# Patient Record
Sex: Male | Born: 1940 | Race: Black or African American | Hispanic: No | Marital: Married | Smoking: Current some day smoker
Health system: Southern US, Community
[De-identification: ages and names within clinical notes are randomized; demographics above are authoritative.]

## PROBLEM LIST (undated history)

## (undated) DIAGNOSIS — I639 Cerebral infarction, unspecified: Secondary | ICD-10-CM

## (undated) DIAGNOSIS — M25471 Effusion, right ankle: Secondary | ICD-10-CM

## (undated) DIAGNOSIS — Z95810 Presence of automatic (implantable) cardiac defibrillator: Secondary | ICD-10-CM

## (undated) DIAGNOSIS — R7983 Abnormal findings of blood amino-acid level: Secondary | ICD-10-CM

## (undated) DIAGNOSIS — J189 Pneumonia, unspecified organism: Secondary | ICD-10-CM

## (undated) DIAGNOSIS — N200 Calculus of kidney: Secondary | ICD-10-CM

## (undated) DIAGNOSIS — I509 Heart failure, unspecified: Secondary | ICD-10-CM

## (undated) DIAGNOSIS — I82409 Acute embolism and thrombosis of unspecified deep veins of unspecified lower extremity: Secondary | ICD-10-CM

## (undated) DIAGNOSIS — E7211 Homocystinuria: Secondary | ICD-10-CM

## (undated) DIAGNOSIS — I2510 Atherosclerotic heart disease of native coronary artery without angina pectoris: Secondary | ICD-10-CM

## (undated) DIAGNOSIS — I10 Essential (primary) hypertension: Secondary | ICD-10-CM

## (undated) DIAGNOSIS — F0390 Unspecified dementia without behavioral disturbance: Secondary | ICD-10-CM

## (undated) DIAGNOSIS — M1990 Unspecified osteoarthritis, unspecified site: Secondary | ICD-10-CM

## (undated) DIAGNOSIS — Q211 Atrial septal defect: Secondary | ICD-10-CM

## (undated) DIAGNOSIS — E785 Hyperlipidemia, unspecified: Secondary | ICD-10-CM

## (undated) DIAGNOSIS — R413 Other amnesia: Secondary | ICD-10-CM

## (undated) DIAGNOSIS — Q2112 Patent foramen ovale: Secondary | ICD-10-CM

## (undated) DIAGNOSIS — I2699 Other pulmonary embolism without acute cor pulmonale: Secondary | ICD-10-CM

## (undated) DIAGNOSIS — M25472 Effusion, left ankle: Secondary | ICD-10-CM

## (undated) DIAGNOSIS — I420 Dilated cardiomyopathy: Secondary | ICD-10-CM

## (undated) DIAGNOSIS — R001 Bradycardia, unspecified: Secondary | ICD-10-CM

## (undated) DIAGNOSIS — K5090 Crohn's disease, unspecified, without complications: Secondary | ICD-10-CM

## (undated) HISTORY — DX: Atrial septal defect: Q21.1

## (undated) HISTORY — DX: Acute embolism and thrombosis of unspecified deep veins of unspecified lower extremity: I82.409

## (undated) HISTORY — DX: Hyperlipidemia, unspecified: E78.5

## (undated) HISTORY — DX: Homocystinuria: E72.11

## (undated) HISTORY — DX: Calculus of kidney: N20.0

## (undated) HISTORY — DX: Atherosclerotic heart disease of native coronary artery without angina pectoris: I25.10

## (undated) HISTORY — PX: COLON SURGERY: SHX602

## (undated) HISTORY — DX: Other pulmonary embolism without acute cor pulmonale: I26.99

## (undated) HISTORY — DX: Abnormal findings of blood amino-acid level: R79.83

## (undated) HISTORY — DX: Crohn's disease, unspecified, without complications: K50.90

## (undated) HISTORY — DX: Patent foramen ovale: Q21.12

## (undated) HISTORY — DX: Dilated cardiomyopathy: I42.0

## (undated) HISTORY — PX: APPENDECTOMY: SHX54

---

## 1991-04-16 DIAGNOSIS — I639 Cerebral infarction, unspecified: Secondary | ICD-10-CM

## 1991-04-16 HISTORY — DX: Cerebral infarction, unspecified: I63.9

## 1997-08-20 ENCOUNTER — Ambulatory Visit (HOSPITAL_COMMUNITY): Admission: RE | Admit: 1997-08-20 | Discharge: 1997-08-20 | Payer: Self-pay | Admitting: Pediatrics

## 1997-08-30 ENCOUNTER — Encounter: Admission: RE | Admit: 1997-08-30 | Discharge: 1997-11-28 | Payer: Self-pay | Admitting: Pediatrics

## 1998-05-23 ENCOUNTER — Ambulatory Visit (HOSPITAL_COMMUNITY): Admission: RE | Admit: 1998-05-23 | Discharge: 1998-05-23 | Payer: Self-pay | Admitting: Family Medicine

## 1999-01-22 ENCOUNTER — Ambulatory Visit (HOSPITAL_BASED_OUTPATIENT_CLINIC_OR_DEPARTMENT_OTHER): Admission: RE | Admit: 1999-01-22 | Discharge: 1999-01-22 | Payer: Self-pay | Admitting: Orthopedic Surgery

## 1999-04-24 ENCOUNTER — Encounter: Admission: RE | Admit: 1999-04-24 | Discharge: 1999-04-24 | Payer: Self-pay | Admitting: Family Medicine

## 1999-04-24 ENCOUNTER — Encounter: Payer: Self-pay | Admitting: Family Medicine

## 1999-06-11 ENCOUNTER — Emergency Department (HOSPITAL_COMMUNITY): Admission: EM | Admit: 1999-06-11 | Discharge: 1999-06-11 | Payer: Self-pay | Admitting: Emergency Medicine

## 1999-06-22 ENCOUNTER — Encounter: Payer: Self-pay | Admitting: Urology

## 1999-06-26 ENCOUNTER — Encounter: Payer: Self-pay | Admitting: Urology

## 1999-06-26 ENCOUNTER — Ambulatory Visit (HOSPITAL_COMMUNITY): Admission: RE | Admit: 1999-06-26 | Discharge: 1999-06-26 | Payer: Self-pay | Admitting: Urology

## 1999-06-26 ENCOUNTER — Encounter (INDEPENDENT_AMBULATORY_CARE_PROVIDER_SITE_OTHER): Payer: Self-pay | Admitting: Specialist

## 1999-11-07 ENCOUNTER — Encounter: Admission: RE | Admit: 1999-11-07 | Discharge: 1999-11-07 | Payer: Self-pay | Admitting: Family Medicine

## 1999-11-07 ENCOUNTER — Encounter: Payer: Self-pay | Admitting: Family Medicine

## 2000-09-06 ENCOUNTER — Emergency Department (HOSPITAL_COMMUNITY): Admission: EM | Admit: 2000-09-06 | Discharge: 2000-09-07 | Payer: Self-pay | Admitting: Emergency Medicine

## 2000-12-25 ENCOUNTER — Encounter: Payer: Self-pay | Admitting: Emergency Medicine

## 2000-12-25 ENCOUNTER — Emergency Department (HOSPITAL_COMMUNITY): Admission: EM | Admit: 2000-12-25 | Discharge: 2000-12-25 | Payer: Self-pay | Admitting: Emergency Medicine

## 2001-01-24 ENCOUNTER — Emergency Department (HOSPITAL_COMMUNITY): Admission: EM | Admit: 2001-01-24 | Discharge: 2001-01-24 | Payer: Self-pay | Admitting: *Deleted

## 2001-09-20 ENCOUNTER — Emergency Department (HOSPITAL_COMMUNITY): Admission: EM | Admit: 2001-09-20 | Discharge: 2001-09-20 | Payer: Self-pay | Admitting: Emergency Medicine

## 2002-04-15 ENCOUNTER — Encounter: Payer: Self-pay | Admitting: Emergency Medicine

## 2002-04-15 ENCOUNTER — Emergency Department (HOSPITAL_COMMUNITY): Admission: EM | Admit: 2002-04-15 | Discharge: 2002-04-15 | Payer: Self-pay | Admitting: Emergency Medicine

## 2002-09-11 ENCOUNTER — Emergency Department (HOSPITAL_COMMUNITY): Admission: EM | Admit: 2002-09-11 | Discharge: 2002-09-11 | Payer: Self-pay | Admitting: Emergency Medicine

## 2002-09-11 ENCOUNTER — Encounter: Payer: Self-pay | Admitting: Emergency Medicine

## 2003-09-20 ENCOUNTER — Encounter: Admission: RE | Admit: 2003-09-20 | Discharge: 2003-09-20 | Payer: Self-pay | Admitting: Family Medicine

## 2003-10-11 ENCOUNTER — Encounter: Payer: Self-pay | Admitting: Emergency Medicine

## 2003-10-12 ENCOUNTER — Inpatient Hospital Stay (HOSPITAL_COMMUNITY): Admission: EM | Admit: 2003-10-12 | Discharge: 2003-10-18 | Payer: Self-pay | Admitting: Neurology

## 2003-10-12 ENCOUNTER — Encounter: Payer: Self-pay | Admitting: Cardiology

## 2003-10-27 ENCOUNTER — Encounter: Admission: RE | Admit: 2003-10-27 | Discharge: 2004-01-25 | Payer: Self-pay | Admitting: Neurology

## 2004-01-12 ENCOUNTER — Ambulatory Visit (HOSPITAL_COMMUNITY): Admission: RE | Admit: 2004-01-12 | Discharge: 2004-01-13 | Payer: Self-pay | Admitting: Cardiology

## 2004-01-18 ENCOUNTER — Ambulatory Visit (HOSPITAL_COMMUNITY): Admission: RE | Admit: 2004-01-18 | Discharge: 2004-01-18 | Payer: Self-pay | Admitting: Cardiology

## 2004-03-01 ENCOUNTER — Encounter: Admission: RE | Admit: 2004-03-01 | Discharge: 2004-03-01 | Payer: Self-pay | Admitting: Family Medicine

## 2004-03-05 ENCOUNTER — Encounter: Admission: RE | Admit: 2004-03-05 | Discharge: 2004-03-05 | Payer: Self-pay | Admitting: Family Medicine

## 2004-08-16 ENCOUNTER — Encounter: Admission: RE | Admit: 2004-08-16 | Discharge: 2004-08-16 | Payer: Self-pay | Admitting: Occupational Medicine

## 2004-09-06 ENCOUNTER — Emergency Department (HOSPITAL_COMMUNITY): Admission: EM | Admit: 2004-09-06 | Discharge: 2004-09-06 | Payer: Self-pay | Admitting: Family Medicine

## 2005-07-25 ENCOUNTER — Inpatient Hospital Stay (HOSPITAL_COMMUNITY): Admission: EM | Admit: 2005-07-25 | Discharge: 2005-08-01 | Payer: Self-pay | Admitting: Emergency Medicine

## 2005-07-25 ENCOUNTER — Encounter: Payer: Self-pay | Admitting: Vascular Surgery

## 2005-07-26 ENCOUNTER — Ambulatory Visit: Payer: Self-pay | Admitting: Internal Medicine

## 2005-07-26 ENCOUNTER — Encounter: Payer: Self-pay | Admitting: Internal Medicine

## 2005-08-05 ENCOUNTER — Ambulatory Visit (HOSPITAL_COMMUNITY): Admission: RE | Admit: 2005-08-05 | Discharge: 2005-08-05 | Payer: Self-pay | Admitting: Family Medicine

## 2006-02-26 ENCOUNTER — Encounter: Admission: RE | Admit: 2006-02-26 | Discharge: 2006-02-26 | Payer: Self-pay | Admitting: Family Medicine

## 2007-07-08 ENCOUNTER — Encounter: Admission: RE | Admit: 2007-07-08 | Discharge: 2007-07-08 | Payer: Self-pay | Admitting: Podiatry

## 2008-01-04 ENCOUNTER — Encounter: Admission: RE | Admit: 2008-01-04 | Discharge: 2008-01-04 | Payer: Self-pay | Admitting: Family Medicine

## 2008-03-21 ENCOUNTER — Encounter: Admission: RE | Admit: 2008-03-21 | Discharge: 2008-03-21 | Payer: Self-pay | Admitting: Internal Medicine

## 2008-08-04 ENCOUNTER — Emergency Department (HOSPITAL_COMMUNITY): Admission: EM | Admit: 2008-08-04 | Discharge: 2008-08-04 | Payer: Self-pay | Admitting: Emergency Medicine

## 2008-10-31 ENCOUNTER — Encounter: Payer: Self-pay | Admitting: Cardiology

## 2008-11-16 ENCOUNTER — Encounter: Payer: Self-pay | Admitting: Cardiology

## 2008-11-24 ENCOUNTER — Encounter: Admission: RE | Admit: 2008-11-24 | Discharge: 2008-11-24 | Payer: Self-pay | Admitting: Gastroenterology

## 2008-12-12 ENCOUNTER — Ambulatory Visit: Payer: Self-pay | Admitting: Cardiology

## 2008-12-12 DIAGNOSIS — I2510 Atherosclerotic heart disease of native coronary artery without angina pectoris: Secondary | ICD-10-CM | POA: Insufficient documentation

## 2008-12-12 DIAGNOSIS — I420 Dilated cardiomyopathy: Secondary | ICD-10-CM

## 2008-12-12 DIAGNOSIS — F17200 Nicotine dependence, unspecified, uncomplicated: Secondary | ICD-10-CM

## 2008-12-12 DIAGNOSIS — R9431 Abnormal electrocardiogram [ECG] [EKG]: Secondary | ICD-10-CM

## 2009-02-10 ENCOUNTER — Ambulatory Visit: Payer: Self-pay | Admitting: Cardiovascular Disease

## 2009-02-10 ENCOUNTER — Ambulatory Visit: Payer: Self-pay | Admitting: Cardiology

## 2009-02-10 ENCOUNTER — Ambulatory Visit: Payer: Self-pay

## 2009-02-10 ENCOUNTER — Encounter: Payer: Self-pay | Admitting: Cardiology

## 2009-02-10 ENCOUNTER — Ambulatory Visit (HOSPITAL_COMMUNITY): Admission: RE | Admit: 2009-02-10 | Discharge: 2009-02-10 | Payer: Self-pay | Admitting: Cardiology

## 2009-02-10 DIAGNOSIS — I110 Hypertensive heart disease with heart failure: Secondary | ICD-10-CM | POA: Insufficient documentation

## 2009-02-22 ENCOUNTER — Encounter: Payer: Self-pay | Admitting: Cardiology

## 2009-02-24 ENCOUNTER — Ambulatory Visit: Payer: Self-pay | Admitting: Cardiology

## 2009-02-24 LAB — CONVERTED CEMR LAB: POC INR: 1.1

## 2009-02-25 ENCOUNTER — Encounter: Admission: RE | Admit: 2009-02-25 | Discharge: 2009-02-25 | Payer: Self-pay | Admitting: Neurology

## 2009-03-02 ENCOUNTER — Ambulatory Visit: Payer: Self-pay | Admitting: Internal Medicine

## 2009-03-08 ENCOUNTER — Telehealth: Payer: Self-pay | Admitting: Internal Medicine

## 2009-03-08 ENCOUNTER — Encounter: Payer: Self-pay | Admitting: Cardiology

## 2009-03-08 ENCOUNTER — Ambulatory Visit: Payer: Self-pay | Admitting: Internal Medicine

## 2009-03-08 DIAGNOSIS — R079 Chest pain, unspecified: Secondary | ICD-10-CM

## 2009-03-08 LAB — CONVERTED CEMR LAB: POC INR: 2.9

## 2009-03-20 ENCOUNTER — Telehealth (INDEPENDENT_AMBULATORY_CARE_PROVIDER_SITE_OTHER): Payer: Self-pay

## 2009-03-21 ENCOUNTER — Ambulatory Visit: Payer: Self-pay

## 2009-03-21 ENCOUNTER — Ambulatory Visit: Payer: Self-pay | Admitting: Internal Medicine

## 2009-03-21 ENCOUNTER — Encounter (HOSPITAL_COMMUNITY): Admission: RE | Admit: 2009-03-21 | Discharge: 2009-04-13 | Payer: Self-pay | Admitting: Internal Medicine

## 2009-03-21 ENCOUNTER — Encounter (INDEPENDENT_AMBULATORY_CARE_PROVIDER_SITE_OTHER): Payer: Self-pay

## 2009-03-21 ENCOUNTER — Encounter (INDEPENDENT_AMBULATORY_CARE_PROVIDER_SITE_OTHER): Payer: Self-pay | Admitting: Cardiology

## 2009-03-21 LAB — CONVERTED CEMR LAB: POC INR: 2.7

## 2009-03-27 ENCOUNTER — Telehealth: Payer: Self-pay | Admitting: Cardiology

## 2009-03-28 ENCOUNTER — Ambulatory Visit: Payer: Self-pay | Admitting: Cardiology

## 2009-03-28 LAB — CONVERTED CEMR LAB: POC INR: 2.4

## 2009-04-03 ENCOUNTER — Ambulatory Visit: Payer: Self-pay | Admitting: Cardiology

## 2009-04-03 DIAGNOSIS — R413 Other amnesia: Secondary | ICD-10-CM

## 2009-04-19 ENCOUNTER — Ambulatory Visit: Payer: Self-pay | Admitting: Cardiovascular Disease

## 2009-04-19 LAB — CONVERTED CEMR LAB: POC INR: 2.5

## 2009-05-17 ENCOUNTER — Ambulatory Visit: Payer: Self-pay | Admitting: Cardiovascular Disease

## 2009-05-17 LAB — CONVERTED CEMR LAB: POC INR: 2.4

## 2009-06-14 ENCOUNTER — Ambulatory Visit: Payer: Self-pay | Admitting: Cardiovascular Disease

## 2009-07-12 ENCOUNTER — Ambulatory Visit: Payer: Self-pay | Admitting: Internal Medicine

## 2009-07-12 ENCOUNTER — Ambulatory Visit: Payer: Self-pay | Admitting: Cardiology

## 2009-07-12 DIAGNOSIS — E119 Type 2 diabetes mellitus without complications: Secondary | ICD-10-CM | POA: Insufficient documentation

## 2009-09-04 ENCOUNTER — Encounter (INDEPENDENT_AMBULATORY_CARE_PROVIDER_SITE_OTHER): Payer: Self-pay | Admitting: Pharmacist

## 2009-11-01 ENCOUNTER — Ambulatory Visit: Payer: Self-pay | Admitting: Cardiology

## 2009-11-08 ENCOUNTER — Ambulatory Visit: Payer: Self-pay | Admitting: Cardiology

## 2009-11-08 LAB — CONVERTED CEMR LAB: POC INR: 1.9

## 2009-11-22 ENCOUNTER — Ambulatory Visit: Payer: Self-pay | Admitting: Cardiology

## 2009-12-07 ENCOUNTER — Ambulatory Visit: Payer: Self-pay | Admitting: Internal Medicine

## 2009-12-07 LAB — CONVERTED CEMR LAB: POC INR: 1.7

## 2009-12-26 ENCOUNTER — Ambulatory Visit: Payer: Self-pay | Admitting: Cardiovascular Disease

## 2009-12-26 ENCOUNTER — Ambulatory Visit: Payer: Self-pay | Admitting: Cardiology

## 2009-12-26 LAB — CONVERTED CEMR LAB: POC INR: 3.5

## 2010-01-10 ENCOUNTER — Ambulatory Visit: Payer: Self-pay | Admitting: Internal Medicine

## 2010-01-10 ENCOUNTER — Ambulatory Visit: Payer: Self-pay | Admitting: Cardiology

## 2010-01-10 DIAGNOSIS — I5022 Chronic systolic (congestive) heart failure: Secondary | ICD-10-CM

## 2010-01-10 LAB — CONVERTED CEMR LAB: POC INR: 3.4

## 2010-01-11 ENCOUNTER — Emergency Department (HOSPITAL_COMMUNITY): Admission: EM | Admit: 2010-01-11 | Discharge: 2010-01-12 | Payer: Self-pay | Admitting: Emergency Medicine

## 2010-01-19 LAB — CONVERTED CEMR LAB
BUN: 14 mg/dL (ref 6–23)
CO2: 29 meq/L (ref 19–32)
Calcium: 8.9 mg/dL (ref 8.4–10.5)
Chloride: 106 meq/L (ref 96–112)
Creatinine, Ser: 1.4 mg/dL (ref 0.4–1.5)
GFR calc non Af Amer: 65.28 mL/min (ref >60)
Glucose, Bld: 131 mg/dL — ABNORMAL HIGH (ref 70–99)
Potassium: 4.3 meq/L (ref 3.5–5.1)
Sodium: 141 meq/L (ref 135–145)

## 2010-01-31 ENCOUNTER — Ambulatory Visit: Payer: Self-pay | Admitting: Cardiology

## 2010-01-31 LAB — CONVERTED CEMR LAB: POC INR: 2

## 2010-02-18 ENCOUNTER — Inpatient Hospital Stay (HOSPITAL_COMMUNITY): Admission: EM | Admit: 2010-02-18 | Discharge: 2010-03-01 | Payer: Self-pay | Admitting: Emergency Medicine

## 2010-02-20 ENCOUNTER — Encounter (INDEPENDENT_AMBULATORY_CARE_PROVIDER_SITE_OTHER): Payer: Self-pay | Admitting: Internal Medicine

## 2010-02-22 ENCOUNTER — Encounter (INDEPENDENT_AMBULATORY_CARE_PROVIDER_SITE_OTHER): Payer: Self-pay | Admitting: Internal Medicine

## 2010-02-26 DIAGNOSIS — F068 Other specified mental disorders due to known physiological condition: Secondary | ICD-10-CM

## 2010-02-27 DIAGNOSIS — R404 Transient alteration of awareness: Secondary | ICD-10-CM

## 2010-03-14 ENCOUNTER — Ambulatory Visit: Payer: Self-pay | Admitting: Internal Medicine

## 2010-03-14 LAB — CONVERTED CEMR LAB: POC INR: 2.3

## 2010-04-05 ENCOUNTER — Encounter: Payer: Self-pay | Admitting: Pulmonary Disease

## 2010-04-05 ENCOUNTER — Ambulatory Visit: Payer: Self-pay | Admitting: Pulmonary Disease

## 2010-04-05 DIAGNOSIS — J449 Chronic obstructive pulmonary disease, unspecified: Secondary | ICD-10-CM

## 2010-04-06 DIAGNOSIS — J439 Emphysema, unspecified: Secondary | ICD-10-CM | POA: Insufficient documentation

## 2010-04-11 ENCOUNTER — Ambulatory Visit: Payer: Self-pay | Admitting: Cardiology

## 2010-05-09 ENCOUNTER — Ambulatory Visit: Admission: RE | Admit: 2010-05-09 | Discharge: 2010-05-09 | Payer: Self-pay | Source: Home / Self Care

## 2010-05-17 ENCOUNTER — Inpatient Hospital Stay (HOSPITAL_COMMUNITY)
Admission: EM | Admit: 2010-05-17 | Discharge: 2010-05-23 | DRG: 065 | Disposition: A | Discharge status: Rehabilitation Facility | Payer: No Typology Code available for payment source | Attending: Internal Medicine | Admitting: Internal Medicine

## 2010-05-17 ENCOUNTER — Emergency Department (HOSPITAL_COMMUNITY): Payer: No Typology Code available for payment source

## 2010-05-17 LAB — DIFFERENTIAL
Basophils Relative: 1 % (ref 0–1)
Eosinophils Absolute: 0.1 10*3/uL (ref 0.0–0.7)
Eosinophils Relative: 2 % (ref 0–5)
Lymphs Abs: 1.5 10*3/uL (ref 0.7–4.0)
Monocytes Absolute: 0.5 10*3/uL (ref 0.1–1.0)
Monocytes Relative: 8 % (ref 3–12)
Neutrophils Relative %: 64 % (ref 43–77)

## 2010-05-17 LAB — COMPREHENSIVE METABOLIC PANEL
Albumin: 3.2 g/dL — ABNORMAL LOW (ref 3.5–5.2)
Alkaline Phosphatase: 85 U/L (ref 39–117)
BUN: 14 mg/dL (ref 6–23)
Creatinine, Ser: 1.43 mg/dL (ref 0.4–1.5)
Glucose, Bld: 187 mg/dL — ABNORMAL HIGH (ref 70–99)
Potassium: 4 mEq/L (ref 3.5–5.1)
Total Bilirubin: 0.9 mg/dL (ref 0.3–1.2)
Total Protein: 6.3 g/dL (ref 6.0–8.3)

## 2010-05-17 LAB — CBC
HCT: 41.6 % (ref 39.0–52.0)
Hemoglobin: 13.8 g/dL (ref 13.0–17.0)
MCHC: 33.2 g/dL (ref 30.0–36.0)
RBC: 4.51 MIL/uL (ref 4.22–5.81)
WBC: 5.9 10*3/uL (ref 4.0–10.5)

## 2010-05-17 LAB — PROTIME-INR
INR: 1.05 (ref 0.00–1.49)
Prothrombin Time: 13.9 seconds (ref 11.6–15.2)

## 2010-05-17 LAB — TROPONIN I: Troponin I: 0.01 ng/mL (ref 0.00–0.06)

## 2010-05-17 LAB — CK TOTAL AND CKMB (NOT AT ARMC)
CK, MB: 1.3 ng/mL (ref 0.3–4.0)
Total CK: 67 U/L (ref 7–232)

## 2010-05-17 LAB — APTT: aPTT: 27 seconds (ref 24–37)

## 2010-05-17 LAB — POCT CARDIAC MARKERS
Myoglobin, poc: 75.2 ng/mL (ref 12–200)
Troponin i, poc: <0.05 ng/mL (ref 0.00–0.09)
Troponin i, poc: <0.05 ng/mL (ref 0.00–0.09)

## 2010-05-17 NOTE — Assessment & Plan Note (Signed)
Summary: per walk ins/af   Visit Type:  Follow-up Referring Provider:  Dr. Burney Gauze Primary Provider:  Dr. Criss Rosales  CC:  Cardiomyopathy.  History of Present Illness: The patient presents for evaluation of his known cardiomyopathy. Since I last saw him he has had no new cardiovascular complaints. He has recently been started on Januvia for his diabetes. However, he ran out of the samples. He has been taking the cardiac meds as listed. At the last visit I added carvedilol 3.125 mg b.i.d. With this he denies any presyncope or syncope. He's not having any shortness of breath, PND or orthopnea. He's not having any chest pressure, neck or arm discomfort. He said no weight gain or swelling. He denies any leg pain or claudication. He does not exercise routinely unfortunately.   Current Medications (verified): 1)  Lisinopril 5 Mg Tabs (Lisinopril) .[name].. 1 By Mouth Daily 2)  Warfarin Sodium 5 Mg Tabs (Warfarin Sodium) .... Use As Directed By Anticoagulation Clinic 3)  Carvedilol 3.125 Mg Tabs (Carvedilol) .... One Twice A Day  Allergies (verified): No Known Drug Allergies  Past History:  Past Medical History: Reviewed history from 04/03/2009 and no changes required. Pulmonary emboli 2007 Coronary artery disease with prior LAD stenting-[place] 2005 Left lower extremity DVT Hyperhomocystinemia Diabetes mellitus Dyslipidemia Intra-atrial septal aneurysm Patent foramen ovale Hypertension Heart failure or( EF 25%) Crohn's disease Nephrolithiasis CVA x 2  Past Surgical History: Reviewed history from 12/12/2008 and no changes required. Resection of terminal ileum Appendectomy  Review of Systems       As stated in the HPI and negative for all other systems.   Vital Signs:  Patient profile:   70 year old male Height:      69 inches Weight:      153 pounds BMI:     22.68 Pulse rate:   58 / minute Resp:     16 per minute BP sitting:   138 / 72  (right arm)  Vitals Entered By: Levora Angel, CNA (July 12, 2009 9:43 AM)  Physical Exam  General:  Well developed, well nourished, in no acute distress. Head:  normocephalic and atraumatic Eyes:  PERRLA/EOM intact; conjunctiva and lids normal. Mouth:  Upper dentures, lower partial. Oral mucosa normal. Neck:  Neck supple, no JVD. No masses, thyromegaly or abnormal cervical nodes. Chest Wall:  no deformities or breast masses noted Lungs:  Clear bilaterally to auscultation and percussion. Abdomen:  Bowel sounds positive; abdomen soft and non-tender without masses, organomegaly, or hernias noted. No hepatosplenomegaly. Msk:  Back normal, normal gait. Muscle strength and tone normal. Extremities:  No clubbing or cyanosis. Neurologic:  Alert and oriented x 3. Skin:  Intact without lesions or rashes. Cervical Nodes:  no significant adenopathy Axillary Nodes:  no significant adenopathy Inguinal Nodes:  no significant adenopathy Psych:  Normal affect.   Detailed Cardiovascular Exam  Neck    Carotids: Carotids full and equal bilaterally without bruits.      Neck Veins: Normal, no JVD.    Heart    Inspection: no deformities or lifts noted.      Palpation: normal PMI with no thrills palpable.      Auscultation: regular rate and rhythm, S1, S2 without murmurs, rubs, gallops, or clicks.    Vascular    Abdominal Aorta: no palpable masses, pulsations, or audible bruits.      Femoral Pulses: normal femoral pulses bilaterally.      Pedal Pulses: diminished right dorsalis pedis pulse, diminished right posterior tibial pulse,  diminished left dorsalis pedis pulse, and diminished left posterior tibial pulse.      Radial Pulses: normal radial pulses bilaterally.      Peripheral Circulation: no clubbing, cyanosis, or edema noted with normal capillary refill.     EKG  Procedure date:  07/12/2009  Findings:      sinus rhythm with premature atrial contractions, early repolarization pattern unchanged from previous EKGs  Impression &  Recommendations:  Problem # 1:  CARDIOMYOPATHY (ICD-425.4) He tolerated the addition of carvedilol. I will titrate this to 6.25 mg b.i.d. He will continue the other medicines as listed. Orders: EKG w/ Interpretation (93000)  Problem # 2:  ESSENTIAL HYPERTENSION, BENIGN (ICD-401.1) We will continue to manage his blood pressure in the context of titrating his meds for cardiomyopathy.  Problem # 3:  DM (ICD-250.00) I spoke with his primary care office today to let him know the patient had no further samples of Januvia. They requested that he call for an appointment to readdress his diabetes control and the patient will do this.  Problem # 4:  CORONARY ARTERY DISEASE S/P LAD DES/PTCA 2005 (ICD-414.00) He is having no ongoing symptoms. He will continue with risk reduction.  Patient Instructions: 1)  Your physician recommends that you schedule a follow-up appointment in: 1 month with Dr Percival Spanish 2)  Your physician has recommended you make the following change in your medication: Increase Carvedilol to 6.25 mg twice a day Prescriptions: CARVEDILOL 6.25 MG TABS (CARVEDILOL) one twice a day  #60 x 11   Entered by:   Sim Boast, RN   Authorized by:   Minus Breeding, MD, [facility]   Signed by:   Sim Boast, RN on 07/12/2009   Method used:   Electronically to        [facility]. [phone]* (retail)       [address]       [place], [place]  [address]       Ph: [phone] or [phone]       Fax: [phone]   RxID:   [id number]

## 2010-05-17 NOTE — Medication Information (Signed)
Summary: rov/jm  Anticoagulant Therapy  Managed by: Freddrick March, RN, BSN Referring MD: Olin Pia PCP: Dr. Criss Rosales Supervising MD: Angelena Form MD, Harrell Gave Indication 1: CVA (436.0) Indication 2: CHF Lab Used: [facility] Site: [facility] INR POC 2.5 INR RANGE 2.0-3.0  Dietary changes: no    Health status changes: no    Bleeding/hemorrhagic complications: no    Recent/future hospitalizations: no    Any changes in medication regimen? no    Recent/future dental: no  Any missed doses?: no       Is patient compliant with meds? yes       Allergies (verified): No Known Drug Allergies  Anticoagulation Management History:      The patient is taking warfarin and comes in today for a routine follow up visit.  Positive risk factors for bleeding include an age of 73 years or older.  The bleeding index is 'intermediate risk'.  Positive CHADS2 values include History of HTN.  Negative CHADS2 values include Age > 3 years old.  His last INR was 2.4.  Anticoagulation responsible provider: Angelena Form MD, Harrell Gave.  INR POC: 2.5.  Cuvette Lot#: [id number].  Exp: 05/2010.    Anticoagulation Management Assessment/Plan:      The patient's current anticoagulation dose is Warfarin sodium 5 mg tabs: Use as directed by Anticoagulation Clinic.  The target INR is 2.0-3.0.  The next INR is due 05/17/2009.  Anticoagulation instructions were given to patient.  Results were reviewed/authorized by Freddrick March, RN, BSN.  He was notified by Freddrick March RN.         Prior Anticoagulation Instructions: INR 2.4  CONTINUE TO TAKE 1 TABLET EVERY DAY.  RECHECK IN 4 WEEKS.  Current Anticoagulation Instructions: INR 2.5  Continue on same dosage 1 tablet daily.   Recheck in 4 weeks.

## 2010-05-17 NOTE — Medication Information (Signed)
Summary: rov/ewj  Anticoagulant Therapy  Managed by: Freddrick March, RN, BSN Referring MD: Olin Pia PCP: Dr. Criss Rosales Supervising MD: Johnsie Cancel MD, Collier Salina Indication 1: CVA (436.0) Indication 2: CHF Lab Used: [facility] Site: [place] INR POC 2.4 INR RANGE 2.0-3.0  Dietary changes: no    Health status changes: no    Bleeding/hemorrhagic complications: no    Recent/future hospitalizations: no    Any changes in medication regimen? no    Recent/future dental: no  Any missed doses?: no       Is patient compliant with meds? yes       Allergies (verified): No Known Drug Allergies  Anticoagulation Management History:      The patient is taking warfarin and comes in today for a routine follow up visit.  Positive risk factors for bleeding include an age of 20 years or older.  The bleeding index is 'intermediate risk'.  Positive CHADS2 values include History of HTN.  Negative CHADS2 values include Age > 68 years old.  His last INR was 2.4.  Anticoagulation responsible provider: Johnsie Cancel MD, Collier Salina.  INR POC: 2.4.  Cuvette Lot#: [id number].  Exp: 07/2010.    Anticoagulation Management Assessment/Plan:      The patient's current anticoagulation dose is Warfarin sodium 5 mg tabs: Use as directed by Anticoagulation Clinic.  The target INR is 2.0-3.0.  The next INR is due 06/14/2009.  Anticoagulation instructions were given to patient.  Results were reviewed/authorized by Freddrick March, RN, BSN.  He was notified by Freddrick March RN.         Prior Anticoagulation Instructions: INR 2.5  Continue on same dosage 1 tablet daily.   Recheck in 4 weeks.    Current Anticoagulation Instructions: INR 2.4  Continue on same dosage 1 tablet daily.  Recheck in 4 weeks.

## 2010-05-17 NOTE — Letter (Signed)
Summary: [facility]  Coumadin  [address] [place]   [place], [place] [address]   Phone: [phone]  Fax: [phone]     Sep 04, 2009 MRN: [id number]   [place], [place]  [address]   Dear Mr. ROTHLISBERGER,  This letter is being sent to you as a reminder that it is necessary for you to get your INR/PT checked regularly so that we can optimize your care.  Our records indicate that you were scheduled to have a test done recently.  As of today, we have not received the results of this test.  It is very important that you have your INR checked.  Please call our office at the number listed above to schedule an appointment at your earliest convenience.    If you have recently had your protime checked or have discontinued this medication, please contact our office at the above phone number to clarify this issue.  Thank you for this prompt attention to this important health care matter.  Sincerely,   [facility]

## 2010-05-17 NOTE — Medication Information (Signed)
Summary: rov/ewj  Anticoagulant Therapy  Managed by: Margaretha Sheffield, PharmD Referring MD: Olin Pia PCP: Dr. Criss Rosales Supervising MD: Johnsie Cancel MD, Collier Salina Indication 1: CVA (436.0) Indication 2: CHF Lab Used: [facility] Site: [facility] INR POC 2.6 INR RANGE 2.0-3.0  Dietary changes: no    Health status changes: no    Bleeding/hemorrhagic complications: no    Recent/future hospitalizations: no    Any changes in medication regimen? no    Recent/future dental: no  Any missed doses?: no       Is patient compliant with meds? yes       Allergies: No Known Drug Allergies  Anticoagulation Management History:      The patient is taking warfarin and comes in today for a routine follow up visit.  Positive risk factors for bleeding include an age of 26 years or older.  The bleeding index is 'intermediate risk'.  Positive CHADS2 values include History of HTN.  Negative CHADS2 values include Age > 85 years old.  His last INR was 2.4.  Anticoagulation responsible provider: Johnsie Cancel MD, Collier Salina.  INR POC: 2.6.  Cuvette Lot#: [id number].  Exp: 08/2010.    Anticoagulation Management Assessment/Plan:      The patient's current anticoagulation dose is Warfarin sodium 5 mg tabs: Use as directed by Anticoagulation Clinic.  The target INR is 2.0-3.0.  The next INR is due 07/12/2009.  Anticoagulation instructions were given to patient.  Results were reviewed/authorized by Margaretha Sheffield, PharmD.  He was notified by Margaretha Sheffield.         Prior Anticoagulation Instructions: INR 2.4  Continue on same dosage 1 tablet daily.  Recheck in 4 weeks.    Current Anticoagulation Instructions: INR 2.6  Continue taking 1 tablet (5 mg) daily.  Return to clinic in 4 weeks.

## 2010-05-17 NOTE — Medication Information (Signed)
Summary: rov/sp  Anticoagulant Therapy  Managed by: Tula Nakayama, RN, BSN Referring MD: Olin Pia PCP: Dr. Criss Rosales Supervising MD: Ron Parker MD, Dellis Filbert Indication 1: CVA (436.0) Indication 2: CHF Lab Used: [facility] Site: [facility] INR POC 1.1 INR RANGE 2.0-3.0  Dietary changes: no    Health status changes: no    Bleeding/hemorrhagic complications: no    Recent/future hospitalizations: no    Any changes in medication regimen? no    Recent/future dental: no  Any missed doses?: no       Is patient compliant with meds? yes      Comments: pt given one pack of samples  Allergies: No Known Drug Allergies  Anticoagulation Management History:      The patient is taking warfarin and comes in today for a routine follow up visit.  Positive risk factors for bleeding include an age of 56 years or older and presence of serious comorbidities.  The bleeding index is 'intermediate risk'.  Positive CHADS2 values include History of HTN and History of Diabetes.  Negative CHADS2 values include Age > 20 years old.  His last INR was 2.4.  Anticoagulation responsible provider: Ron Parker MD, Dellis Filbert.  INR POC: 1.1.  Cuvette Lot#: [id number].  Exp: 01/2011.    Anticoagulation Management Assessment/Plan:      The patient's current anticoagulation dose is Warfarin sodium 5 mg tabs: Use as directed by Anticoagulation Clinic.  The target INR is 2.0-3.0.  The next INR is due 11/08/2009.  Anticoagulation instructions were given to patient.  Results were reviewed/authorized by Tula Nakayama, RN, BSN.  He was notified by Lind Covert.         Prior Anticoagulation Instructions: INR 1.6  Take 1.5 tablets today.  Then return to regular dosing schedule of 1 tablet (5 mg) every day.  Return to clinic in 3 weeks.   Current Anticoagulation Instructions: INR 1.1  Take 1.5 tabs today and tomorrow.  Then continue same dose of 1 tab daily.  Re-check INR in 1 week.

## 2010-05-17 NOTE — Letter (Signed)
Summary: [facility] Medical Office Note  [facility] Medical Office Note   Imported By: Sallee Provencal 05/09/2009 11:06:58  _____________________________________________________________________  External Attachment:    Type:   Image     Comment:   External Document

## 2010-05-17 NOTE — Medication Information (Signed)
Summary: rov/eac  Anticoagulant Therapy  Managed by: Margaretha Sheffield, PharmD Referring MD: Olin Pia PCP: Dr. Criss Rosales Supervising MD: Haroldine Laws MD, Quillian Quince Indication 1: CVA (436.0) Indication 2: CHF Lab Used: [facility] Site: [place] INR POC 1.6 INR RANGE 2.0-3.0  Dietary changes: no    Health status changes: no    Bleeding/hemorrhagic complications: no    Recent/future hospitalizations: no    Any changes in medication regimen? no    Recent/future dental: no  Any missed doses?: no       Is patient compliant with meds? yes       Allergies: No Known Drug Allergies  Anticoagulation Management History:      The patient is taking warfarin and comes in today for a routine follow up visit.  Positive risk factors for bleeding include an age of 72 years or older.  The bleeding index is 'intermediate risk'.  Positive CHADS2 values include History of HTN.  Negative CHADS2 values include Age > 72 years old.  His last INR was 2.4.  Anticoagulation responsible provider: Bensimhon MD, Quillian Quince.  INR POC: 1.6.  Cuvette Lot#: [id number].  Exp: 08/2010.    Anticoagulation Management Assessment/Plan:      The patient's current anticoagulation dose is Warfarin sodium 5 mg tabs: Use as directed by Anticoagulation Clinic.  The target INR is 2.0-3.0.  The next INR is due 08/02/2009.  Anticoagulation instructions were given to patient.  Results were reviewed/authorized by Margaretha Sheffield, PharmD.  He was notified by Margaretha Sheffield.         Prior Anticoagulation Instructions: INR 2.6  Continue taking 1 tablet (5 mg) daily.  Return to clinic in 4 weeks.   Current Anticoagulation Instructions: INR 1.6  Take 1.5 tablets today.  Then return to regular dosing schedule of 1 tablet (5 mg) every day.  Return to clinic in 3 weeks.

## 2010-05-17 NOTE — Medication Information (Signed)
Summary: rov/sp  Anticoagulant Therapy  Managed by: Gypsy Lore, PharmD Referring MD: Olin Pia PCP: Dr. Criss Rosales Supervising MD: Haroldine Laws MD, Quillian Quince Indication 1: CVA (436.0) Indication 2: CHF Lab Used: [facility] Site: [facility] INR POC 2.3 INR RANGE 2.0-3.0  Dietary changes: no    Health status changes: no    Bleeding/hemorrhagic complications: no    Recent/future hospitalizations: yes       Details: Recent D/C from hospital on 11/10. Resumed previous Coumadin schedule.   Any changes in medication regimen? no    Recent/future dental: no  Any missed doses?: no       Is patient compliant with meds? yes       Allergies: No Known Drug Allergies  Anticoagulation Management History:      The patient is taking warfarin and comes in today for a routine follow up visit.  Positive risk factors for bleeding include an age of 72 years or older and presence of serious comorbidities.  The bleeding index is 'intermediate risk'.  Positive CHADS2 values include History of CHF, History of HTN, and History of Diabetes.  Negative CHADS2 values include Age > 70 years old.  His last INR was 2.4.  Anticoagulation responsible provider: Bensimhon MD, Quillian Quince.  INR POC: 2.3.  Cuvette Lot#: [id number].  Exp: 02/2011.    Anticoagulation Management Assessment/Plan:      The patient's current anticoagulation dose is Warfarin sodium 5 mg tabs: Use as directed by Anticoagulation Clinic.  The target INR is 2.0-3.0.  The next INR is due 04/11/2010.  Anticoagulation instructions were given to patient.  Results were reviewed/authorized by Gypsy Lore, PharmD.  He was notified by Gypsy Lore PharmD.         Prior Anticoagulation Instructions: INR 2.0  Continue taking 1 tablet everyday. Recheck in 4 weeks.   Current Anticoagulation Instructions: INR 2.3  Continue taking Coumadin 1 tab (5 mg) every day. Return to clinic in 4 weeks.  Prescriptions: WARFARIN SODIUM 5 MG TABS (WARFARIN  SODIUM) Use as directed by Anticoagulation Clinic  #30 Tablet x 1   Entered by:   Gypsy Lore PharmD   Authorized by:   Jolaine Artist, MD, [facility]   Signed by:   Gypsy Lore PharmD on 03/14/2010   Method used:   Electronically to        [facility]. [phone]* (retail)       [address]       [place], [place]  [address]       Ph: [phone] or [phone]       Fax: [phone]   RxID:   [id number]

## 2010-05-17 NOTE — Medication Information (Signed)
Summary: rov/tm  Anticoagulant Therapy  Managed by: Freddrick March, RN, BSN Referring MD: Olin Pia PCP: Dr. Criss Rosales Supervising MD: Aundra Dubin MD, [place] Indication 1: CVA (436.0) Indication 2: CHF Lab Used: [facility] Site: [facility] INR POC 3.5 INR RANGE 2.0-3.0  Dietary changes: no    Health status changes: no    Bleeding/hemorrhagic complications: no    Recent/future hospitalizations: no    Any changes in medication regimen? no    Recent/future dental: no  Any missed doses?: no       Is patient compliant with meds? yes       Allergies: No Known Drug Allergies  Anticoagulation Management History:      The patient is taking warfarin and comes in today for a routine follow up visit.  Positive risk factors for bleeding include an age of 72 years or older and presence of serious comorbidities.  The bleeding index is 'intermediate risk'.  Positive CHADS2 values include History of HTN and History of Diabetes.  Negative CHADS2 values include Age > 20 years old.  His last INR was 2.4.  Anticoagulation responsible Rakhi Romagnoli: Aundra Dubin MD, [place].  INR POC: 3.5.  Cuvette Lot#: [id number].  Exp: 02/2011.    Anticoagulation Management Assessment/Plan:      The patient's current anticoagulation dose is Warfarin sodium 5 mg tabs: Use as directed by Anticoagulation Clinic.  The target INR is 2.0-3.0.  The next INR is due 01/09/2010.  Anticoagulation instructions were given to patient.  Results were reviewed/authorized by Freddrick March, RN, BSN.  He was notified by Freddrick March RN.         Prior Anticoagulation Instructions: INR 1.7 Today take extra 1/2 pill then resume 1 pill everyday except 1.5 pills on Wednesdays. Recheck in 2 weeks.   Current Anticoagulation Instructions: INR 3.5  Take 1/2 tablet tomorrow, then start taking 1 tablet daily.  Recheck in 2 weeks.

## 2010-05-17 NOTE — Assessment & Plan Note (Addendum)
Summary: HFU PER Grant Lara/[facility]   Visit Type:  Hospital Follow-up Primary Provider/Referring Provider:  Dr. Criss Rosales  CC:  Grant Lara new to clinic. Hospital follow up Grant Lara.  History of Present Illness: 69/M smoker, with CHF & old CVA for evaluation of RUL cavity noted on CT scan 02/18/10. This was performed during hosp adm for severe neck pain & confusion, BAL neg for afb, cx. CT chest incidentally also noted 1.4 cm thyroid nodule &  right sided thoracic outlet obstruction with collaterals opacified in the right axilla.  I reviewed this feature with the radiologist myself.  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  Although contrast was not used, no mediastinal lymphadenopathy or mass  was noted. RUL cavity measured 1.5 x 2.8 cm - seemed more like infected bulla. PPD was neg   April 05, 2010 2:02 PM  neck pain better - sleeps with hot pad, no hedaache 6 wk afb cx neg Spirometry was poor effort, hence unreliable  Preventive Screening-Counseling & Management  Alcohol-Tobacco     Alcohol drinks/day: 0     Smoking Status: current     Packs/Day: 1.0     Year Started: 1962  Current Medications (verified): 1)  Haloperidol 2 Mg Tabs (Haloperidol) .... Take 1 Tab By Mouth At Bedtime 2)  Lisinopril 10 Mg Tabs (Lisinopril) .... Take 1 Tablet By Mouth Once A Day 3)  Carvedilol 6.25 Mg Tabs (Carvedilol) .... Take 1 Tablet By Mouth Two Times A Day 4)  Warfarin Sodium 5 Mg Tabs (Warfarin Sodium) .... Use As Directed By Coumadin Clinic 5)  Tramadol Hcl 50 Mg Tabs (Tramadol Hcl) .... Take 1-2 Tablet By Mouth Every 6 Hours As Needed 6)  Artificial Tears  Soln (Artificial Tear Solution) .... As Needed  Allergies (verified): No Known Drug Allergies  Past History:  Past Medical History: 1. Encephalopathy secondary to delirium with underlying  dementia with    behavioral disturbance, improved. 2. Neck pain secondary to spondylosis, improved.  4. Hypertension. 5. History of cerebrovascular accident with expressive aphasia. 6. History of deep venous thrombosis. 7. History of patent foramen ovale. 8. Dementia. 9. Tobacco abuse. 10.Right thyroid nodule. 48.JEHUDJS systolic heart failure, stable and compensated. 12.Diabetes. 13.History of pulmonary emboli. 14.History of intra-arterial septal aneurysm. 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 16.History of coronary artery disease status post previous left  anterior descending artery stenting in [place] in 2005. 17.History of terminal ileal resection in 1994 secondary to Crohn    disease, no recurrence since then. 18.History of heart failure, ejection fraction 25% per echo in October   2010.  Family History: Family History Hypertension-father  Social History: Marital Status: Married Children: yes, 4 Occupation: Retired from [employer] Grant Lara is a current smoker.  Smoking Status:  current Packs/Day:  1.0 Alcohol drinks/day:  0  Review of Systems       The Grant Lara complains of productive cough and non-productive cough.  The Grant Lara denies shortness of breath with activity, shortness of breath at rest, coughing up blood, chest pain, irregular heartbeats, acid heartburn, indigestion, loss of appetite, weight change, abdominal pain, difficulty swallowing, sore throat, tooth/dental problems, headaches, nasal congestion/difficulty breathing through nose, sneezing, itching, ear ache, anxiety, depression, hand/feet swelling, joint stiffness or pain, rash, change in color of mucus, and fever.    Vital Signs:  Grant Lara profile:   70 year old  male Height:      69 inches Weight:      145.4 pounds BMI:     21.55 O2 Sat:      95 % on Room air Temp:     97.9 degrees F oral Pulse rate:   63 / minute BP sitting:   120 / 60  (right arm) Cuff size:   regular  Vitals Entered By: Iran Planas CMA (April 05, 2010 1:46 PM)  O2 Flow:  Room air CC: Grant Lara new to clinic. Hospital follow up Grant Lara Comments Medications reviewed with Grant Lara Verified contact number and pharmacy with Grant Lara Iran Planas [facility]  April 05, 2010 1:47 PM    Physical Exam  Additional Exam:  Gen. Pleasant, well-nourished, in no distress ENT - no lesions, no post nasal drip Neck: No JVD, no thyromegaly, no carotid bruits Lungs: no use of accessory muscles, no dullness to percussion, clear without rales or rhonchi  Cardiovascular: Rhythm regular, heart sounds  normal, no murmurs or gallops, no peripheral edema Musculoskeletal: No deformities, no cyanosis or clubbing      CXR  Procedure date:  04/05/2010  Findings:      Comparison: 11/10 and 02/18/2010 and chest CT dated 02/18/2010  Findings: The heart size and vascularity are normal and the lungs are clear except for slight scarring at the right apex.  No acute osseous abnormality.  IMPRESSION: No acute abnormalities.  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 Does not need meds for now Need to focus on tobacco cessation.  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 Will need FU scan in 6 months  Medications Added to Medication List This Visit: 1)  Haloperidol 2 Mg Tabs (Haloperidol) .... Take 1 tab by mouth at bedtime 2)  Lisinopril 10 Mg Tabs (Lisinopril) .... Take 1 tablet by mouth once a day 3)  Carvedilol 6.25 Mg Tabs (Carvedilol) .... Take 1 tablet by mouth two times a day 4)  Warfarin Sodium 5 Mg Tabs (Warfarin sodium) .... Use as directed by coumadin clinic 5)  Tramadol Hcl 50 Mg Tabs (Tramadol hcl) .... Take 1-2 tablet by mouth every 6 hours as needed 6)  Artificial Tears Soln (Artificial tear solution) .... As needed  Other Orders: Est. Grant Lara Level IV  (99214) T-2 View CXR (71020TC)  Grant Lara Instructions: 1)  Copy sent to: Dr Criss Rosales 2)  Please schedule a follow-up appointment in 3 months with TP 3)  A chest x-ray has been recommended.  Your imaging study may require preauthorization.    Immunization History:  Influenza Immunization History:    Influenza:  historical (02/09/2010)  Pneumovax Immunization History:    Pneumovax:  historical (02/19/2010)

## 2010-05-17 NOTE — Medication Information (Signed)
Summary: rov/tm  Anticoagulant Therapy  Managed by: Porfirio Oar, PharmD Referring MD: Olin Pia PCP: Dr. Criss Rosales Supervising MD: Stanford Breed MD, Aaron Edelman Indication 1: CVA (436.0) Indication 2: CHF Lab Used: [facility] Site: [place] INR POC 3.1 INR RANGE 2.0-3.0  Dietary changes: no    Health status changes: no    Bleeding/hemorrhagic complications: no    Recent/future hospitalizations: no    Any changes in medication regimen? no    Recent/future dental: no  Any missed doses?: no       Is patient compliant with meds? yes       Allergies: No Known Drug Allergies  Anticoagulation Management History:      The patient is taking warfarin and comes in today for a routine follow up visit.  Positive risk factors for bleeding include an age of 72 years or older and presence of serious comorbidities.  The bleeding index is 'intermediate risk'.  Positive CHADS2 values include History of HTN and History of Diabetes.  Negative CHADS2 values include Age > 52 years old.  His last INR was 2.4.  Anticoagulation responsible provider: Stanford Breed MD, Aaron Edelman.  INR POC: 3.1.  Cuvette Lot#: [id number].  Exp: 01/2011.    Anticoagulation Management Assessment/Plan:      The patient's current anticoagulation dose is Warfarin sodium 5 mg tabs: Use as directed by Anticoagulation Clinic.  The target INR is 2.0-3.0.  The next INR is due 12/06/2009.  Anticoagulation instructions were given to patient.  Results were reviewed/authorized by Porfirio Oar, PharmD.  He was notified by Vassie Loll, PharmD Candidate.         Prior Anticoagulation Instructions: INR 1.9 Today take 1 1/2 pills then change dose to 1 pill everyday except 1 1/2 pills on Wednesdays. Recheck in 2 weeks.   Current Anticoagulation Instructions: INR 3.1   Take 1/2 tablet (2.73m) tomorrow.  Then go back to normal schedule of 1 tablet (57m every day except take 1.5 tablets (7.41m54mon Wednesdays.  Recheck in 2 weeks.

## 2010-05-17 NOTE — Medication Information (Signed)
Summary: rov/sp  Anticoagulant Therapy  Managed by: Danella Penton, RN Referring MD: Olin Pia PCP: Dr. Criss Rosales Supervising MD: Johnsie Cancel MD, Collier Salina Indication 1: CVA (436.0) Indication 2: CHF Lab Used: [facility] [place] Site: [place] INR POC 2.4 INR RANGE 2.0-3.0  Dietary changes: no    Health status changes: no    Bleeding/hemorrhagic complications: no    Recent/future hospitalizations: no    Any changes in medication regimen? no    Recent/future dental: no  Any missed doses?: no       Is patient compliant with meds? yes       Allergies: No Known Drug Allergies  Anticoagulation Management History:      The patient is taking warfarin and comes in today for a routine follow up visit.  Positive risk factors for bleeding include an age of 70 years or older and presence of serious comorbidities.  The bleeding index is 'intermediate risk'.  Positive CHADS2 values include History of CHF, History of HTN, and History of Diabetes.  Negative CHADS2 values include Age > 53 years old.  His last INR was 2.4.  Anticoagulation responsible provider: Johnsie Cancel MD, Collier Salina.  INR POC: 2.4.  Cuvette Lot#: [id number].  Exp: 04/2011.    Anticoagulation Management Assessment/Plan:      The patient's current anticoagulation dose is Warfarin sodium 5 mg tabs: Use as directed by Anticoagulation Clinic.  The target INR is 2.0-3.0.  The next INR is due 06/06/2010.  Anticoagulation instructions were given to patient.  Results were reviewed/authorized by Danella Penton, RN.  He was notified by Danella Penton, RN.         Prior Anticoagulation Instructions: INR 2.7  Continue same dose of 1 tablet every day.  Recheck INR in 4 weeks.   Current Anticoagulation Instructions: INR 2.4 Continue taking 1 tablet every day. Recheck in 4 weeks.

## 2010-05-17 NOTE — Medication Information (Signed)
Summary: rov/sp  Anticoagulant Therapy  Managed by: Porfirio Oar, PharmD Referring MD: Olin Pia PCP: Dr. Criss Rosales Supervising MD: Ron Parker MD, Dellis Filbert Indication 1: CVA (436.0) Indication 2: CHF Lab Used: [facility] Site: [place] INR POC 2.0 INR RANGE 2.0-3.0  Dietary changes: no    Health status changes: no    Bleeding/hemorrhagic complications: no    Recent/future hospitalizations: no    Any changes in medication regimen? no    Recent/future dental: no  Any missed doses?: no       Is patient compliant with meds? yes       Allergies: No Known Drug Allergies  Anticoagulation Management History:      The patient is taking warfarin and comes in today for a routine follow up visit.  Positive risk factors for bleeding include an age of 70 years or older and presence of serious comorbidities.  The bleeding index is 'intermediate risk'.  Positive CHADS2 values include History of CHF, History of HTN, and History of Diabetes.  Negative CHADS2 values include Age > 40 years old.  His last INR was 2.4.  Anticoagulation responsible [place]: Ron Parker MD, Dellis Filbert.  INR POC: 2.0.  Cuvette Lot#: [id number].  Exp: 02/2011.    Anticoagulation Management Assessment/Plan:      The patient's current anticoagulation dose is Warfarin sodium 5 mg tabs: Use as directed by Anticoagulation Clinic.  The target INR is 2.0-3.0.  The next INR is due 02/28/2010.  Anticoagulation instructions were given to patient.  Results were reviewed/authorized by Porfirio Oar, PharmD.  He was notified by Griffith Citron D candidate.         Prior Anticoagulation Instructions: INR 3.4  Skip tomorrow's dose of Coumadin then start taking only 1 tablet every day.  Recheck INR in 3 weeks.   Current Anticoagulation Instructions: INR 2.0  Continue taking 1 tablet everyday. Recheck in 4 weeks.

## 2010-05-17 NOTE — Medication Information (Signed)
Summary: rov/sl  Anticoagulant Therapy  Managed by: Porfirio Oar, PharmD Referring MD: Olin Pia PCP: Dr. Criss Rosales Supervising MD: Lia Foyer MD, Marcello Moores Indication 1: CVA (436.0) Indication 2: CHF Lab Used: [facility] [place] Site: [place] INR POC 2.7 INR RANGE 2.0-3.0  Dietary changes: no    Health status changes: no    Bleeding/hemorrhagic complications: no    Recent/future hospitalizations: no    Any changes in medication regimen? no    Recent/future dental: no  Any missed doses?: no       Is patient compliant with meds? yes       Allergies: No Known Drug Allergies  Anticoagulation Management History:      The patient is taking warfarin and comes in today for a routine follow up visit.  Positive risk factors for bleeding include an age of 70 years or older and presence of serious comorbidities.  The bleeding index is 'intermediate risk'.  Positive CHADS2 values include History of CHF, History of HTN, and History of Diabetes.  Negative CHADS2 values include Age > 31 years old.  His last INR was 2.4.  Anticoagulation responsible provider: Lia Foyer MD, Marcello Moores.  INR POC: 2.7.  Cuvette Lot#: [id number].  Exp: 04/2011.    Anticoagulation Management Assessment/Plan:      The patient's current anticoagulation dose is Warfarin sodium 5 mg tabs: Use as directed by Anticoagulation Clinic.  The target INR is 2.0-3.0.  The next INR is due 05/09/2010.  Anticoagulation instructions were given to patient.  Results were reviewed/authorized by Porfirio Oar, PharmD.  He was notified by Porfirio Oar PharmD.         Prior Anticoagulation Instructions: INR 2.3  Continue taking Coumadin 1 tab (5 mg) every day. Return to clinic in 4 weeks.   Current Anticoagulation Instructions: INR 2.7  Continue same dose of 1 tablet every day.  Recheck INR in 4 weeks.

## 2010-05-17 NOTE — Assessment & Plan Note (Signed)
Summary: per check out/sf   Visit Type:  Follow-up Primary Andrian Sabala:  Dr. Criss Rosales  CC:  Cardiomyopathy.  History of Present Illness: The patient presents for followup of his cardiomyopathy. Unfortunately he's again run out of his medications he says for the last 2 days. However, he says he's been feeling well. He does some walking every day. He is denying any chest discomfort, neck or arm discomfort. He's not having any palpitations, presyncope or syncope. He is having no PND or orthopnea. He has had no weight gain or edema.  Current Medications (verified): 1)  Lisinopril 5 Mg Tabs (Lisinopril) .[name].. 1 By Mouth Daily 2)  Warfarin Sodium 5 Mg Tabs (Warfarin Sodium) .... Use As Directed By Anticoagulation Clinic 3)  Carvedilol 6.25 Mg Tabs (Carvedilol) .... One Twice A Day  Allergies (verified): No Known Drug Allergies  Past History:  Past Medical History: Reviewed history from 04/03/2009 and no changes required. Pulmonary emboli 2007 Coronary artery disease with prior LAD stenting-[place] 2005 Left lower extremity DVT Hyperhomocystinemia Diabetes mellitus Dyslipidemia Intra-atrial septal aneurysm Patent foramen ovale Hypertension Heart failure or( EF 25%) Crohn's disease Nephrolithiasis CVA x 2  Past Surgical History: Reviewed history from 12/12/2008 and no changes required. Resection of terminal ileum Appendectomy  Review of Systems       As stated in the HPI and negative for all other systems.   Vital Signs:  Patient profile:   70 year old male Height:      69 inches Weight:      145 pounds BMI:     21.49 Pulse rate:   68 / minute BP sitting:   150 / 80  (left arm)  Vitals Entered By: Margaretmary Bayley CMA (December 26, 2009 9:45 AM)  Physical Exam  General:  Well developed, well nourished, in no acute distress. Head:  normocephalic and atraumatic Eyes:  PERRLA/EOM intact; conjunctiva and lids normal. Mouth:  Upper dentures, lower partial. Oral mucosa  normal. Neck:  Neck supple, no JVD. No masses, thyromegaly or abnormal cervical nodes. Chest Wall:  no deformities or breast masses noted Lungs:  Clear bilaterally to auscultation and percussion. Abdomen:  Bowel sounds positive; abdomen soft and non-tender without masses, organomegaly, or hernias noted. No hepatosplenomegaly. Msk:  Back normal, normal gait. Muscle strength and tone normal. Extremities:  No clubbing or cyanosis. Neurologic:  Alert and oriented x 3. Skin:  Intact without lesions or rashes. Cervical Nodes:  no significant adenopathy Inguinal Nodes:  no significant adenopathy Psych:  Normal affect.   Detailed Cardiovascular Exam  Neck    Carotids: Carotids full and equal bilaterally without bruits.      Neck Veins: Normal, no JVD.    Heart    Inspection: no deformities or lifts noted.      Palpation: normal PMI with no thrills palpable.      Auscultation: regular rate and rhythm, S1, S2 without murmurs, rubs, gallops, or clicks.    Vascular    Abdominal Aorta: no palpable masses, pulsations, or audible bruits.      Femoral Pulses: normal femoral pulses bilaterally.      Pedal Pulses: diminished right dorsalis pedis pulse, diminished right posterior tibial pulse, diminished left dorsalis pedis pulse, and diminished left posterior tibial pulse.      Radial Pulses: normal radial pulses bilaterally.      Peripheral Circulation: no clubbing, cyanosis, or edema noted with normal capillary refill.     EKG  Procedure date:  12/26/2009  Findings:  Sinus rhythm with premature atrial contractions, left ventricular hypertrophy with repolarization changes, no change from previous  Impression & Recommendations:  Problem # 1:  CARDIOMYOPATHY (ICD-425.4) I have again encouraged him to be compliant with his medications. I'm going to continue to titrate his medications. Today I'll increase his lisinopril to 10 mg daily. He'll get a basic metabolic profile in 2 weeks.  In the  future if he can remain compliant in his ejection fraction remains below 35% I will consider him for ICD. Orders: EKG w/ Interpretation (93000)  Problem # 2:  ESSENTIAL HYPERTENSION, BENIGN (ICD-401.1) He did not take his medications today. His blood pressures controlled on his medications. I am assuming that I have the blood pressure to titrate the lisinopril as above.  Problem # 3:  COUMADIN THERAPY (ICD-V58.61) I have reviewed this. He has a cardiomyopathy, previous bilateral pulmonary emboli, DVT, PFO. I think this combination calls for lifelong Coumadin therapy unless there is a contraindication. Note he is not on aspirin while on Coumadin.  Problem # 4:  CORONARY ARTERY DISEASE S/P LAD DES/PTCA 2005 (ICD-414.00) He is having no cardiovascular symptoms. He will continue with risk reduction. Unfortunately causes a big consideration. This will make using and starting a statin, even generic, very problematic. I will address this in the future but I would rather he consistently take his heart failure medications first.  Patient Instructions: 1)  Your physician recommends that you schedule a follow-up appointment in: 6 months with Dr Percival Spanish 2)  Your physician recommends that you return for lab work in:  2 weeks for a BMP  428.22 401.1  v58.69 3)  Your physician has recommended you make the following change in your medication: Lisinopril 10 mg a day Prescriptions: LISINOPRIL 10 MG TABS (LISINOPRIL) one daily  #30 x 11   Entered by:   Sim Boast, RN   Authorized by:   Minus Breeding, MD, [facility]   Signed by:   Sim Boast, RN on 12/26/2009   Method used:   Electronically to        [facility]. [phone]* (retail)       [address], [place]  [address]       Ph: [phone] or [phone]       Fax: [phone]   RxID:   [id number]  I have reviewed and approved all prescriptions at the time of this visit. Minus Breeding, MD, [facility]  December 26, 2009  10:15 AM

## 2010-05-17 NOTE — Medication Information (Signed)
Summary: rov/sp  Anticoagulant Therapy  Managed by: Porfirio Oar, PharmD Referring MD: Olin Pia PCP: Dr. Criss Rosales Supervising MD: Ron Parker MD, Dellis Filbert Indication 1: CVA (436.0) Indication 2: CHF Lab Used: [facility] Site: [place] INR POC 3.4 INR RANGE 2.0-3.0  Dietary changes: no    Health status changes: no    Bleeding/hemorrhagic complications: no    Recent/future hospitalizations: no    Any changes in medication regimen? yes       Details: started lisinopril   Recent/future dental: no  Any missed doses?: no       Is patient compliant with meds? yes      Comments: Pt has been taking 1 tablet every day except 1 1/2 tablets on Wednesday rather than 1 tablet daily since last visit.   Allergies: No Known Drug Allergies  Anticoagulation Management History:      The patient is taking warfarin and comes in today for a routine follow up visit.  Positive risk factors for bleeding include an age of 32 years or older and presence of serious comorbidities.  The bleeding index is 'intermediate risk'.  Positive CHADS2 values include History of HTN and History of Diabetes.  Negative CHADS2 values include Age > 62 years old.  His last INR was 2.4.  Anticoagulation responsible [facility]: Ron Parker MD, Dellis Filbert.  INR POC: 3.4.  Exp: 02/2011.    Anticoagulation Management Assessment/Plan:      The patient's current anticoagulation dose is Warfarin sodium 5 mg tabs: Use as directed by Anticoagulation Clinic.  The target INR is 2.0-3.0.  The next INR is due 01/31/2010.  Anticoagulation instructions were given to patient.  Results were reviewed/authorized by Porfirio Oar, PharmD.  He was notified by Porfirio Oar PharmD.         Prior Anticoagulation Instructions: INR 3.5  Take 1/2 tablet tomorrow, then start taking 1 tablet daily.  Recheck in 2 weeks.    Current Anticoagulation Instructions: INR 3.4  Skip tomorrow's dose of Coumadin then start taking only 1 tablet every day.   Recheck INR in 3 weeks.  Prescriptions: WARFARIN SODIUM 5 MG TABS (WARFARIN SODIUM) Use as directed by Anticoagulation Clinic  #30 Tablet x 2   Entered by:   Porfirio Oar PharmD   Authorized by:   Minus Breeding, MD, [facility]   Signed by:   Porfirio Oar PharmD on 01/10/2010   Method used:   Electronically to        [facility]. [phone]* (retail)       [address]       [place], [place]  [address]       Ph: [phone] or [phone]       Fax: [phone]   RxID:   [id number]

## 2010-05-17 NOTE — Medication Information (Signed)
Summary: rov/ln  Anticoagulant Therapy  Managed by: Tula Nakayama, RN, BSN Referring MD: Olin Pia PCP: Dr. Criss Rosales Supervising MD: Aundra Dubin MD, Bradyn Soward Indication 1: CVA (436.0) Indication 2: CHF Lab Used: [facility] Site: [place] INR POC 1.9 INR RANGE 2.0-3.0  Dietary changes: no    Health status changes: no    Bleeding/hemorrhagic complications: no    Recent/future hospitalizations: no    Any changes in medication regimen? no    Recent/future dental: no  Any missed doses?: no       Is patient compliant with meds? yes       Allergies: No Known Drug Allergies  Anticoagulation Management History:      The patient is taking warfarin and comes in today for a routine follow up visit.  Positive risk factors for bleeding include an age of 72 years or older and presence of serious comorbidities.  The bleeding index is 'intermediate risk'.  Positive CHADS2 values include History of HTN and History of Diabetes.  Negative CHADS2 values include Age > 72 years old.  His last INR was 2.4.  Anticoagulation responsible provider: Aundra Dubin MD, Lizandra Zakrzewski.  INR POC: 1.9.  Cuvette Lot#: [id number].  Exp: 01/2011.    Anticoagulation Management Assessment/Plan:      The patient's current anticoagulation dose is Warfarin sodium 5 mg tabs: Use as directed by Anticoagulation Clinic.  The target INR is 2.0-3.0.  The next INR is due 11/22/2009.  Anticoagulation instructions were given to patient.  Results were reviewed/authorized by Tula Nakayama, RN, BSN.  He was notified by Tula Nakayama, RN, BSN.         Prior Anticoagulation Instructions: INR 1.1  Take 1.5 tabs today and tomorrow.  Then continue same dose of 1 tab daily.  Re-check INR in 1 week.   Current Anticoagulation Instructions: INR 1.9 Today take 1 1/2 pills then change dose to 1 pill everyday except 1 1/2 pills on Wednesdays. Recheck in 2 weeks.

## 2010-05-17 NOTE — Medication Information (Signed)
Summary: rov/jk  Anticoagulant Therapy  Managed by: Tula Nakayama, RN, BSN Referring MD: Olin Pia PCP: Dr. Criss Rosales Supervising MD: Harrington Challenger MD, Nevin Bloodgood Indication 1: CVA (436.0) Indication 2: CHF Lab Used: [facility] Site: [place] INR POC 1.7 INR RANGE 2.0-3.0  Dietary changes: no    Health status changes: no    Bleeding/hemorrhagic complications: no    Recent/future hospitalizations: no    Any changes in medication regimen? no    Recent/future dental: no  Any missed doses?: no       Is patient compliant with meds? yes       Allergies: No Known Drug Allergies  Anticoagulation Management History:      The patient is taking warfarin and comes in today for a routine follow up visit.  Positive risk factors for bleeding include an age of 72 years or older and presence of serious comorbidities.  The bleeding index is 'intermediate risk'.  Positive CHADS2 values include History of HTN and History of Diabetes.  Negative CHADS2 values include Age > 80 years old.  His last INR was 2.4.  Anticoagulation responsible Ashlley Booher: Harrington Challenger MD, Nevin Bloodgood.  INR POC: 1.7.  Cuvette Lot#: [id number].  Exp: 01/2011.    Anticoagulation Management Assessment/Plan:      The patient's current anticoagulation dose is Warfarin sodium 5 mg tabs: Use as directed by Anticoagulation Clinic.  The target INR is 2.0-3.0.  The next INR is due 12/26/2009.  Anticoagulation instructions were given to patient.  Results were reviewed/authorized by Tula Nakayama, RN, BSN.  He was notified by Tula Nakayama, RN, BSN.         Prior Anticoagulation Instructions: INR 3.1   Take 1/2 tablet (2.32m) tomorrow.  Then go back to normal schedule of 1 tablet (520m every day except take 1.5 tablets (7.20m38mon Wednesdays.  Recheck in 2 weeks.   Current Anticoagulation Instructions: INR 1.7 Today take extra 1/2 pill then resume 1 pill everyday except 1.5 pills on Wednesdays. Recheck in 2 weeks.

## 2010-05-18 ENCOUNTER — Inpatient Hospital Stay (HOSPITAL_COMMUNITY): Payer: No Typology Code available for payment source

## 2010-05-18 DIAGNOSIS — D649 Anemia, unspecified: Secondary | ICD-10-CM | POA: Diagnosis present

## 2010-05-18 DIAGNOSIS — F068 Other specified mental disorders due to known physiological condition: Secondary | ICD-10-CM | POA: Diagnosis present

## 2010-05-18 DIAGNOSIS — I428 Other cardiomyopathies: Secondary | ICD-10-CM | POA: Diagnosis present

## 2010-05-18 DIAGNOSIS — I10 Essential (primary) hypertension: Secondary | ICD-10-CM | POA: Diagnosis present

## 2010-05-18 DIAGNOSIS — I2510 Atherosclerotic heart disease of native coronary artery without angina pectoris: Secondary | ICD-10-CM | POA: Diagnosis present

## 2010-05-18 DIAGNOSIS — Z9861 Coronary angioplasty status: Secondary | ICD-10-CM

## 2010-05-18 DIAGNOSIS — I5022 Chronic systolic (congestive) heart failure: Secondary | ICD-10-CM | POA: Diagnosis present

## 2010-05-18 DIAGNOSIS — I6350 Cerebral infarction due to unspecified occlusion or stenosis of unspecified cerebral artery: Principal | ICD-10-CM | POA: Diagnosis present

## 2010-05-18 DIAGNOSIS — I509 Heart failure, unspecified: Secondary | ICD-10-CM | POA: Diagnosis present

## 2010-05-18 DIAGNOSIS — Z7901 Long term (current) use of anticoagulants: Secondary | ICD-10-CM

## 2010-05-18 DIAGNOSIS — I517 Cardiomegaly: Secondary | ICD-10-CM

## 2010-05-18 DIAGNOSIS — I69920 Aphasia following unspecified cerebrovascular disease: Secondary | ICD-10-CM

## 2010-05-18 DIAGNOSIS — G8190 Hemiplegia, unspecified affecting unspecified side: Secondary | ICD-10-CM | POA: Diagnosis present

## 2010-05-18 DIAGNOSIS — Z86718 Personal history of other venous thrombosis and embolism: Secondary | ICD-10-CM

## 2010-05-18 DIAGNOSIS — Z7982 Long term (current) use of aspirin: Secondary | ICD-10-CM

## 2010-05-18 LAB — CARDIAC PANEL(CRET KIN+CKTOT+MB+TROPI)
CK, MB: 1 ng/mL (ref 0.3–4.0)
Relative Index: INVALID (ref 0.0–2.5)
Total CK: 55 U/L (ref 7–232)
Troponin I: 0.02 ng/mL (ref 0.00–0.06)

## 2010-05-18 LAB — CBC
HCT: 38.3 % — ABNORMAL LOW (ref 39.0–52.0)
Hemoglobin: 12.9 g/dL — ABNORMAL LOW (ref 13.0–17.0)
MCH: 30.5 pg (ref 26.0–34.0)
MCHC: 33.7 g/dL (ref 30.0–36.0)
MCV: 90.5 fL (ref 78.0–100.0)
Platelets: 169 10*3/uL (ref 150–400)
RBC: 4.23 MIL/uL (ref 4.22–5.81)
RDW: 13.8 % (ref 11.5–15.5)
WBC: 5.6 10*3/uL (ref 4.0–10.5)

## 2010-05-18 LAB — GLUCOSE, CAPILLARY
Glucose-Capillary: 79 mg/dL (ref 70–99)
Glucose-Capillary: 87 mg/dL (ref 70–99)
Glucose-Capillary: 71 mg/dL (ref 70–99)
Glucose-Capillary: 125 mg/dL — ABNORMAL HIGH (ref 70–99)
Glucose-Capillary: 114 mg/dL — ABNORMAL HIGH (ref 70–99)

## 2010-05-18 LAB — PROTIME-INR
INR: 1.07 (ref 0.00–1.49)
Prothrombin Time: 14.1 seconds (ref 11.6–15.2)

## 2010-05-18 LAB — COMPREHENSIVE METABOLIC PANEL
ALT: 9 U/L (ref 0–53)
AST: 10 U/L (ref 0–37)
Albumin: 2.8 g/dL — ABNORMAL LOW (ref 3.5–5.2)
Alkaline Phosphatase: 81 U/L (ref 39–117)
BUN: 12 mg/dL (ref 6–23)
CO2: 27 mEq/L (ref 19–32)
Calcium: 8.6 mg/dL (ref 8.4–10.5)
Chloride: 109 mEq/L (ref 96–112)
Creatinine, Ser: 1.14 mg/dL (ref 0.4–1.5)
GFR calc Af Amer: >60 mL/min (ref >60)
GFR calc non Af Amer: >60 mL/min (ref >60)
Glucose, Bld: 83 mg/dL (ref 70–99)
Potassium: 3.6 mEq/L (ref 3.5–5.1)
Sodium: 143 mEq/L (ref 135–145)
Total Bilirubin: 0.9 mg/dL (ref 0.3–1.2)
Total Protein: 5.7 g/dL — ABNORMAL LOW (ref 6.0–8.3)

## 2010-05-18 LAB — URINALYSIS, ROUTINE W REFLEX MICROSCOPIC
Bilirubin Urine: NEGATIVE
Hgb urine dipstick: NEGATIVE
Protein, ur: NEGATIVE mg/dL
Urine Glucose, Fasting: NEGATIVE mg/dL

## 2010-05-18 LAB — LIPID PANEL
Cholesterol: 171 mg/dL (ref 0–200)
HDL: 37 mg/dL — ABNORMAL LOW (ref >39)
LDL Cholesterol: 123 mg/dL — ABNORMAL HIGH (ref 0–99)
Triglycerides: 56 mg/dL (ref <150)

## 2010-05-19 LAB — GLUCOSE, CAPILLARY
Glucose-Capillary: 81 mg/dL (ref 70–99)
Glucose-Capillary: 99 mg/dL (ref 70–99)

## 2010-05-19 LAB — COMPREHENSIVE METABOLIC PANEL
ALT: 9 U/L (ref 0–53)
AST: 11 U/L (ref 0–37)
CO2: 28 mEq/L (ref 19–32)
Calcium: 8.6 mg/dL (ref 8.4–10.5)
Creatinine, Ser: 1.23 mg/dL (ref 0.4–1.5)
GFR calc Af Amer: >60 mL/min (ref >60)
GFR calc non Af Amer: 58 mL/min — ABNORMAL LOW (ref >60)
Sodium: 141 mEq/L (ref 135–145)
Total Protein: 5.4 g/dL — ABNORMAL LOW (ref 6.0–8.3)

## 2010-05-19 LAB — PROTIME-INR
INR: 1.17 (ref 0.00–1.49)
Prothrombin Time: 15.1 seconds (ref 11.6–15.2)

## 2010-05-19 LAB — CBC
MCH: 29.9 pg (ref 26.0–34.0)
MCHC: 32.9 g/dL (ref 30.0–36.0)
Platelets: 141 10*3/uL — ABNORMAL LOW (ref 150–400)
RDW: 13.7 % (ref 11.5–15.5)

## 2010-05-20 LAB — CBC
HCT: 35.9 % — ABNORMAL LOW (ref 39.0–52.0)
Hemoglobin: 11.9 g/dL — ABNORMAL LOW (ref 13.0–17.0)
RBC: 3.96 MIL/uL — ABNORMAL LOW (ref 4.22–5.81)

## 2010-05-20 LAB — GLUCOSE, CAPILLARY
Glucose-Capillary: 66 mg/dL — ABNORMAL LOW (ref 70–99)
Glucose-Capillary: 81 mg/dL (ref 70–99)

## 2010-05-20 LAB — DIFFERENTIAL
Basophils Absolute: 0 10*3/uL (ref 0.0–0.1)
Basophils Relative: 0 % (ref 0–1)
Lymphocytes Relative: 41 % (ref 12–46)
Monocytes Relative: 12 % (ref 3–12)
Neutro Abs: 2.6 10*3/uL (ref 1.7–7.7)
Neutrophils Relative %: 45 % (ref 43–77)

## 2010-05-20 LAB — PROTIME-INR: Prothrombin Time: 17.5 seconds — ABNORMAL HIGH (ref 11.6–15.2)

## 2010-05-21 DIAGNOSIS — I6330 Cerebral infarction due to thrombosis of unspecified cerebral artery: Secondary | ICD-10-CM

## 2010-05-21 LAB — PROTIME-INR
INR: 1.8 — ABNORMAL HIGH (ref 0.00–1.49)
Prothrombin Time: 21.1 seconds — ABNORMAL HIGH (ref 11.6–15.2)

## 2010-05-21 LAB — GLUCOSE, CAPILLARY
Glucose-Capillary: 61 mg/dL — ABNORMAL LOW (ref 70–99)
Glucose-Capillary: 129 mg/dL — ABNORMAL HIGH (ref 70–99)

## 2010-05-22 LAB — GLUCOSE, CAPILLARY
Glucose-Capillary: 83 mg/dL (ref 70–99)
Glucose-Capillary: 94 mg/dL (ref 70–99)
Glucose-Capillary: 91 mg/dL (ref 70–99)
Glucose-Capillary: 118 mg/dL — ABNORMAL HIGH (ref 70–99)

## 2010-05-23 ENCOUNTER — Inpatient Hospital Stay (HOSPITAL_COMMUNITY)
Admission: EM | Admit: 2010-05-23 | Discharge: 2010-05-31 | DRG: 945 | Disposition: A | Discharge status: Home Health | Payer: No Typology Code available for payment source | Source: Ambulatory Visit | Attending: Physical Medicine & Rehabilitation | Admitting: Physical Medicine & Rehabilitation

## 2010-05-23 DIAGNOSIS — Z7901 Long term (current) use of anticoagulants: Secondary | ICD-10-CM

## 2010-05-23 DIAGNOSIS — Z86718 Personal history of other venous thrombosis and embolism: Secondary | ICD-10-CM

## 2010-05-23 DIAGNOSIS — E119 Type 2 diabetes mellitus without complications: Secondary | ICD-10-CM | POA: Diagnosis present

## 2010-05-23 DIAGNOSIS — Z5189 Encounter for other specified aftercare: Secondary | ICD-10-CM

## 2010-05-23 DIAGNOSIS — E785 Hyperlipidemia, unspecified: Secondary | ICD-10-CM | POA: Diagnosis present

## 2010-05-23 DIAGNOSIS — I6350 Cerebral infarction due to unspecified occlusion or stenosis of unspecified cerebral artery: Secondary | ICD-10-CM | POA: Diagnosis present

## 2010-05-23 DIAGNOSIS — I10 Essential (primary) hypertension: Secondary | ICD-10-CM | POA: Diagnosis present

## 2010-05-23 DIAGNOSIS — Z9861 Coronary angioplasty status: Secondary | ICD-10-CM

## 2010-05-23 DIAGNOSIS — I6330 Cerebral infarction due to thrombosis of unspecified cerebral artery: Secondary | ICD-10-CM

## 2010-05-23 DIAGNOSIS — F17200 Nicotine dependence, unspecified, uncomplicated: Secondary | ICD-10-CM | POA: Diagnosis present

## 2010-05-23 DIAGNOSIS — I2510 Atherosclerotic heart disease of native coronary artery without angina pectoris: Secondary | ICD-10-CM | POA: Diagnosis present

## 2010-05-23 DIAGNOSIS — I509 Heart failure, unspecified: Secondary | ICD-10-CM | POA: Diagnosis present

## 2010-05-23 LAB — GLUCOSE, CAPILLARY: Glucose-Capillary: 136 mg/dL — ABNORMAL HIGH (ref 70–99)

## 2010-05-23 LAB — PROTIME-INR
INR: 2.2 — ABNORMAL HIGH (ref 0.00–1.49)
Prothrombin Time: 24.6 seconds — ABNORMAL HIGH (ref 11.6–15.2)

## 2010-05-24 DIAGNOSIS — G8110 Spastic hemiplegia affecting unspecified side: Secondary | ICD-10-CM

## 2010-05-24 DIAGNOSIS — Z5189 Encounter for other specified aftercare: Secondary | ICD-10-CM

## 2010-05-24 DIAGNOSIS — I69993 Ataxia following unspecified cerebrovascular disease: Secondary | ICD-10-CM

## 2010-05-24 DIAGNOSIS — I6330 Cerebral infarction due to thrombosis of unspecified cerebral artery: Secondary | ICD-10-CM

## 2010-05-24 DIAGNOSIS — I69998 Other sequelae following unspecified cerebrovascular disease: Secondary | ICD-10-CM

## 2010-05-24 DIAGNOSIS — R209 Unspecified disturbances of skin sensation: Secondary | ICD-10-CM

## 2010-05-24 LAB — DIFFERENTIAL
Basophils Absolute: 0 10*3/uL (ref 0.0–0.1)
Eosinophils Absolute: 0.2 10*3/uL (ref 0.0–0.7)
Eosinophils Relative: 4 % (ref 0–5)

## 2010-05-24 LAB — PROTIME-INR
INR: 2.31 — ABNORMAL HIGH (ref 0.00–1.49)
Prothrombin Time: 25.5 seconds — ABNORMAL HIGH (ref 11.6–15.2)

## 2010-05-24 LAB — GLUCOSE, CAPILLARY
Glucose-Capillary: 80 mg/dL (ref 70–99)
Glucose-Capillary: 93 mg/dL (ref 70–99)

## 2010-05-24 LAB — COMPREHENSIVE METABOLIC PANEL
AST: 15 U/L (ref 0–37)
Albumin: 2.6 g/dL — ABNORMAL LOW (ref 3.5–5.2)
Calcium: 8.5 mg/dL (ref 8.4–10.5)
Creatinine, Ser: 1.29 mg/dL (ref 0.4–1.5)
GFR calc Af Amer: >60 mL/min (ref >60)
GFR calc non Af Amer: 55 mL/min — ABNORMAL LOW (ref >60)

## 2010-05-24 LAB — CBC
Platelets: 157 10*3/uL (ref 150–400)
RDW: 13.7 % (ref 11.5–15.5)
WBC: 5.5 10*3/uL (ref 4.0–10.5)

## 2010-05-25 LAB — GLUCOSE, CAPILLARY
Glucose-Capillary: 141 mg/dL — ABNORMAL HIGH (ref 70–99)
Glucose-Capillary: 72 mg/dL (ref 70–99)
Glucose-Capillary: 122 mg/dL — ABNORMAL HIGH (ref 70–99)

## 2010-05-26 LAB — GLUCOSE, CAPILLARY: Glucose-Capillary: 97 mg/dL (ref 70–99)

## 2010-05-27 LAB — GLUCOSE, CAPILLARY: Glucose-Capillary: 71 mg/dL (ref 70–99)

## 2010-05-28 LAB — PROTIME-INR
INR: 3.1 — ABNORMAL HIGH (ref 0.00–1.49)
Prothrombin Time: 32 seconds — ABNORMAL HIGH (ref 11.6–15.2)

## 2010-05-29 ENCOUNTER — Telehealth (INDEPENDENT_AMBULATORY_CARE_PROVIDER_SITE_OTHER): Payer: Self-pay | Admitting: *Deleted

## 2010-05-29 LAB — GLUCOSE, CAPILLARY

## 2010-05-30 LAB — PROTIME-INR
INR: 3.52 — ABNORMAL HIGH (ref 0.00–1.49)
Prothrombin Time: 35.3 seconds — ABNORMAL HIGH (ref 11.6–15.2)

## 2010-05-30 NOTE — H&P (Signed)
NAME:  Grant Lara, Grant Lara                  ACCOUNT NO.:  [id number]  MEDICAL RECORD NO.:  [id number]           PATIENT TYPE:  I  LOCATION:  2130                         FACILITY:  [facility]  PHYSICIAN:  Meredith Staggers, M.D.DATE OF BIRTH:  02-16-1941  DATE OF ADMISSION:  05/23/2010 DATE OF DISCHARGE:                             HISTORY & PHYSICAL   PRIMARY CARE PHYSICIAN:  Lucianne Lei, MD.  CARDIOLOGIST:  Minus Breeding, MD, [facility].  NEUROLOGIST:  Pramod P. Leonie Man, MD.  CHIEF COMPLAINT:  Left-sided weakness.  HISTORY OF PRESENT ILLNESS:  A 70 year old black male with history of patent foramen ovale, on chronic Coumadin.  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  He was admitted on February 2 with increased left- sided weakness.  MRI showed acute infarct in the right thalamus and has remote left frontal infarct.  MRA was notable for right posterior cerebral artery stenosis.  Echocardiogram showed ejection fraction of 35%.  INR was 1 on admission and subcu Lovenox was added until his INR reached 2.  The patient was seen by myself yesterday for rehab evaluation and I felt that he could benefit from an inpatient rehab stay.  REVIEW OF SYSTEMS:  Notable for some reflux, periodically numbness in the left side.  Other pertinent positives are above, and full 12-point review is in the written H and P.  PAST MEDICAL HISTORY:  Positive for the above as well as DVT, CVA with aphasia, CHF, Crohn disease, CAD with PTCA, non-insulin-requiring diabetes, positive tobacco, negative alcohol use.  FAMILY HISTORY:  Positive CAD and diabetes.  SOCIAL HISTORY:  The patient lives with his wife, who can assist as needed.  He has a one-level house, two steps to enter.  ALLERGIES:  None.  HOME MEDICATIONS:  Lisinopril, Coumadin, Coreg, and artificial tears.  LABS:  Hemoglobin 11.9, white count 5.9, platelets 142.  Sodium 141, potassium 3.7, BUN 13, creatinine 1.23, INR 2.2 today.  PHYSICAL EXAM:   VITAL SIGNS:  Blood pressure is 135/66, pulse 66, respiratory rate 20, temperature 98. GENERAL:  The patient is pleasant, sitting in bed, in no acute distress. HEENT:  Pupils are equal, round, and reactive to light.  Ear, nose, and throat exam is notable for multiple missing teeth.  Mucosa is pink and moist. NECK:  Supple without JVD or lymphadenopathy. CHEST:  Clear to auscultation bilaterally without wheezes, rales, or rhonchi. HEART:  Regular rate and rhythm without murmurs, rubs, or gallops. ABDOMEN:  Soft, nontender.  Bowel sounds are positive. SKIN:  Notable for some chronic changes and dryness in the distal limbs especially.  Otherwise, no breakdown was seen. NEUROLOGIC:  Cranial nerves II through XII notable for mild right central VII.  He did have some problems in the right lower visual fields.  It is difficult to say if there was a definite hemianopsia, but there was some visual loss to the periphery.  Reflexes are 1+. Sensation intact in the face, but decreased at 1 to 1+/2 in the left arm and leg today.  He did have a left pronator drift.  He had decreased fine motor coordination with left  arm and leg today.  Strength is 4/5 to 4+/5, upper extremities.  Left may have been a trace bit weaker than the right.  Lower extremity strength is grossly 3 to 4/5 proximal to 4/5 distally.  Judgment, orientation, and memory are fair.  Mood was generally pleasant.  He did have occasional word finding deficits, but these were generally mild.  POSTADMISSION PHYSICIAN EVALUATION: 1. Functional deficit secondary to right thalamic infarct with left     hemisensory deficit and ataxia.  The patient with mild residual     expressive aphasia from his prior stroke. 2. The patient is admitted to receive collaborative interdisciplinary     care between the physiatrist, rehab nursing staff, and therapy     team. 3. The patient's level of medical complexity and substantial therapy     needs in  context of that medical necessity cannot be provided at a     lesser intensity of care. 4. The patient has experienced substantial functional loss from his     baseline.  Premorbidly, he was independent.  Currently, his min     assist bed mobility and min assist gait 50 feet handheld assistance     set up with ADLs.  Judging by the patient's diagnosis, physical     exam, and functional history, he has the potential for functional     progress, which will result in measurable gains while on inpatient     rehab.  His gains will be of substantial and practical use upon     discharge to home in facilitating mobility and self-care. 5. The physiatrist will provide 24-hour management of medical needs as     well as oversight of therapy plans/treatment and provide guidance     as appropriate regarding interaction of the two.  Medical problem     list and plan are below. 6. A 24-hour rehab nursing team will assist in the management of the     patient's skin care needs as well as bowel and bladder function,     safety awareness, integration of therapy concept techniques, safety     education, etc. 7. PT will assess and treat for lower extremity strength, balance,     coordination, adaptive techniques and equipment, safety,     neuromuscular education, perceptual awareness with goals modified     independent. 8. OT will assess and treat for upper extremity use, ADLs, adaptive     technique and equipment, neuromuscular education, fine motor     coordination, functional mobility, safety, balance with goals,     modified independent. 9. Speech language pathology will follow up for cognition and     language.  The patient seems to be close to his baseline there. 10.Case management and social worker will assess and treat for     psychosocial issues and discharge planning. 11.Team conferences will be held weekly to assess progress towards     goals and determine barriers to discharge. 12.The patient  has demonstrated sufficient medical stability and     exercise capacity to tolerate at least 3 hours of therapy per day     at least 5 days per week. 13.Estimate length of stay is approximately 1 week.  Prognosis is     good.  MEDICAL PROBLEM LIST AND PLAN: 1. Anticoagulation with Coumadin.  INR is therapeutic today.  No     Lovenox coverage needed.  No active bleeding signs.  Follow up H     and H serially as well  as INR. 2. Blood pressure control:  Lisinopril and Coreg on board.  His     systolic pressure borderline today.  This will bear further     watching going forward and may need further adjustment. 3. CHF:  Continue strict Is and Os.  Watch weights.  Ejection fraction     is 35%. 4. Non-insulin-requiring diabetes:  Check CBCs before meals and     nightly.  Cover sliding-scale insulin as     appropriate. 5. History of PFO/DVT:  Coumadin. 6. History of CAD with PTCA.     Meredith Staggers, M.D.     ZTS/MEDQ  D:  05/23/2010  T:  05/24/2010  Job:  [id number]  cc:   Lucianne Lei, M.D. Minus Breeding, MD, [facility]. Leonie Man, MD  Electronically Signed by Alger Simons M.D. on 05/30/2010 10:05:06 AM

## 2010-05-31 LAB — GLUCOSE, CAPILLARY

## 2010-06-05 ENCOUNTER — Encounter: Payer: Self-pay | Admitting: Cardiovascular Disease

## 2010-06-05 DIAGNOSIS — I639 Cerebral infarction, unspecified: Secondary | ICD-10-CM

## 2010-06-05 DIAGNOSIS — I2699 Other pulmonary embolism without acute cor pulmonale: Secondary | ICD-10-CM

## 2010-06-05 DIAGNOSIS — Z8673 Personal history of transient ischemic attack (TIA), and cerebral infarction without residual deficits: Secondary | ICD-10-CM | POA: Insufficient documentation

## 2010-06-05 DIAGNOSIS — Z86711 Personal history of pulmonary embolism: Secondary | ICD-10-CM | POA: Insufficient documentation

## 2010-06-05 DIAGNOSIS — I82409 Acute embolism and thrombosis of unspecified deep veins of unspecified lower extremity: Secondary | ICD-10-CM | POA: Insufficient documentation

## 2010-06-05 LAB — CONVERTED CEMR LAB: POC INR: 1.5

## 2010-06-06 NOTE — Discharge Summary (Signed)
NAME:  Grant Lara, Grant Lara                  ACCOUNT NO.:  [id number]  MEDICAL RECORD NO.:  [id number]           PATIENT TYPE:  I  LOCATION:  0388                         FACILITY:  [facility]  PHYSICIAN:  Charlett Blake, M.D.DATE OF BIRTH:  06/04/1940  DATE OF ADMISSION:  05/23/2010 DATE OF DISCHARGE:  05/31/2010                              DISCHARGE SUMMARY   DISCHARGE DIAGNOSES: 1. Right thalamic infarction, on chronic Coumadin therapy. 2. Hypertension. 3. Hyperlipidemia. 4. Congestive heart failure. 5. Non-insulin-dependent diabetes mellitus. 6. History of  patent foramen ovale with deep vein thrombosis. 7. Coronary artery disease with percutaneous transluminal coronary     angioplasty. 8. Tobacco abuse.  This is a 70 year old male with history of PFO with deep vein thrombosis, on chronic Coumadin therapy, as well as history of cerebrovascular accident, admitted May 17, 2010, with increased left- sided weakness.  MRI showed acute lateral aspect infarction right thalamus and remote left frontal infarction.  MRA with right posterior cerebral artery stenosis.  Echocardiogram with ejection fraction of 35% with diffuse hypokinesis.  Carotid Dopplers negative.  INR on admission 1.07.  Subcutaneous Lovenox added with Coumadin until INR greater than two.  He was moderate assist for mobility.  He was admitted for comprehensive rehab program.  PAST MEDICAL HISTORY:  See discharge diagnoses.  ALLERGIES:  None.  He smokes approximately one pack per day.  No alcohol.  SOCIAL HISTORY:  Lives with his wife and assistance as needed, one-level home, two steps to entry.  Functional history prior to admission was independent.  Functional status upon admission to rehab services was minimal assist bed mobility, transfers moderate assist ambulation, set up for activities of daily living.  MEDICATIONS PRIOR TO ADMISSION: 1. Lisinopril 10 mg at bedtime. 2. Coumadin 5 mg daily. 3. Coreg 6.25  mg twice daily. 4. Artificial tears as needed.  PHYSICAL EXAMINATION:  VITAL SIGNS:  Blood pressure 135/65, pulse 66, temperature 98, respirations 20. GENERAL:  This was an alert male in no acute distress, oriented x3.  He was ataxic.  Deep tendon reflexes are hypoactive.  Sensation decreased to light touch in the left. LUNGS:  Clear to auscultation. CARDIAC:  Regular rate and rhythm. ABDOMEN:  Soft, nontender.  Good bowel sounds.  REHABILITATION HOSPITAL COURSE:  The patient was admitted to inpatient rehab services with therapies initiated on a 3-hour daily basis consisting of physical therapy, occupational therapy, speech therapy and rehabilitation nursing.  The following issues were addressed during the patient's rehabilitation stay.  Pertaining to Mr. Berrios right thalamic infarction with left hemisensory deficits as well as ataxia, he continued to progress nicely in therapies, maintained on chronic Coumadin therapy with INR of 3.52 as of May 30, 2010, to be followed by the [facility] on discharge.  His blood pressures remained well-controlled on Coreg and lisinopril with no orthostatic changes.  Zocor for hyperlipidemia.  He did have a history of tobacco abuse maintained on a NicoDerm patch, taper as directed.  It was discussed at length the need for cessation of smoking and nicotine products.  It was questionable if he would be compliant with these  requests.  During his rehabilitation stay, he had no chest pain or shortness of breath, he was continent of bowel and bladder.  The patient received weekly collaborative interdisciplinary team conferences to discuss estimated length of stay, family teaching, and any barriers to his discharge.  He was ambulating extended distances without an assistive device, needing some verbal cues for hand rails, supervision for transfers.  He did need minimal assist for step over step tandem walking.  Independent for activities  of daily living except minimal assist for some lower body socks and shoes due to ataxia, ongoing therapies would be dictated as per rehab services.  Overall strength endurance greatly improved.  Full family teaching was completed.  Plan is to be discharged to home.  Latest labs showed an INR of 3.52, hemoglobin 12.4, hematocrit 37.9, platelet 157,000.  Sodium 138, potassium 4.7, BUN 11, creatinine 1.29.  DISCHARGE MEDICATIONS:  At time of dictation, included 1. Coumadin 5 mg daily with a goal INR of 2.0 to 3.0. 2. Coreg 6.25 mg twice daily. 3. Lisinopril 2.5 mg at bedtime. 4. Zocor 20 mg daily. 5. NicoDerm patch, taper as directed. 6. Tylenol as needed.  His diet was no concentrated sweets due to the history of non-insulin- dependent diabetes mellitus, which he was on no present medications. Ongoing therapies would be dictated as per rehab services.  Arrangements made to follow up with the [facility] for his chronic Coumadin therapy.  He is advised no driving or smoking.  He would follow up Dr. Alysia Penna at the outpatient rehab center as advised.  Dr. Lucianne Lei, medical management and Dr. Leonie Man, neurology service, call for appointment.     [name], P.A.   ______________________________ Charlett Blake, M.D.    DA/MEDQ  D:  05/30/2010  T:  05/31/2010  Job:  [id number]  cc:   Pramod P. Leonie Man, MD Lucianne Lei, M.D.  Electronically Signed by [name] P.A. on 06/04/2010 06:19:11 AM Electronically Signed by Alysia Penna M.D. on 06/06/2010 09:55:51 AM

## 2010-06-07 ENCOUNTER — Encounter (INDEPENDENT_AMBULATORY_CARE_PROVIDER_SITE_OTHER): Payer: Self-pay | Admitting: *Deleted

## 2010-06-11 ENCOUNTER — Encounter: Payer: Self-pay | Admitting: Cardiology

## 2010-06-11 NOTE — H&P (Signed)
NAME:  Grant Lara, Grant Lara                  ACCOUNT NO.:  [id number]  MEDICAL RECORD NO.:  [id number]           PATIENT TYPE:  E  LOCATION:  [facility]                         FACILITY:  [facility]  PHYSICIAN:  Rise Patience, MDDATE OF BIRTH:  01/11/1941  DATE OF ADMISSION:  05/17/2010 DATE OF DISCHARGE:                             HISTORY & PHYSICAL   PRIMARY CARE PHYSICIAN:  Lucianne Lei, MD  CHIEF COMPLAINT:  Left side weakness.  HISTORY OF PRESENT ILLNESS:  A 70 year old male with known history of PFO, DVT on Coumadin, history of systolic heart failure with last EF measured in November 2011 40%, history of CAD status post stenting history of Crohn disease stable at this time for almost 9 years, previous history of CVA with expressive aphasia, has experienced an onset of left-sided weakness while he was eating at 3:30 p.m.  He had come to the ER around 6:30 p.m.  The patient's CT head is negative.  At this time, the patient has been admitted for further workup.  On-call neurologist, Dr. Brett Fairy was already consulted and as per Dr. Brett Fairy the patient is not a candidate for TPA as the patient has passed the window period and also the patient is on Coumadin.  The patient denies any chest pain, shortness of breath.  Denies any visual symptoms, headache, did not have any weakness in the right upper and lower extremity, did not lose consciousness, did not have dizziness, did not have any problems speaking or swallowing though while doing the swallow evaluation, the patient did say he had some difficulty.  The patient denies any nausea, vomiting, abdominal pain, dysuria, discharge, or diarrhea.  The patient has been admitted for further workup for his CVA.  PAST MEDICAL HISTORY: 1. History of systolic heart failure, last EF measured in November     2011 was 40%. 2. History of Crohn disease. 3. History of PFO and DVT, on Coumadin. 4. History of CAD status post stenting. 5. History of  thyroid nodule. 6. History of right upper lobe cavity status post bronchoscopy and     biopsy.  Biopsies are negative for any malignancy.  PAST SURGICAL HISTORY:  Surgery for Crohn disease and a stent placed.  MEDICATIONS ON ADMISSION:  The patient is on: 1. Coumadin. 2. Coreg 6.25 b.i.d. 3. Lisinopril 10 mg daily.  ALLERGIES:  No known drug allergies.  FAMILY HISTORY:  Significant for coronary artery disease in his father and diabetes in his mother.  SOCIAL HISTORY:  The patient is married, lives with his family,  States he quit smoking this week.  Denies any alcohol or drug abuse.  Full code.  REVIEW OF SYSTEMS:  As per the history of present illness, nothing else significant.  PHYSICAL EXAMINATION:  GENERAL:  The patient examined at bedside, not in acute distress. VITAL SIGNS:  Blood pressure 130/60, pulse 80 per minute, temperature 98.3, respiration 18 per minute, O2 sat 100%. HEENT:  Anicteric.  No pallor.  No facial asymmetry.  Tongue is midline. Uvula is mildly deviated to the right.  PERRLA positive.  The patient is able to count fingers at 1  meter.  No discharge from ears, eyes, nose or mouth. NECK:  No neck rigidity. CHEST:  Bilateral air entry present.  No rhonchi, no crepitation. HEART:  S1, S2 is heard. ABDOMEN:  Soft, nontender.  Bowel sounds heard. CNS: The patient is alert, awake, oriented to time, place, and person, able to move his right upper and lower extremities 5/5.  Left foot upper and lower extremities around 4/5 with decreased grip strength. EXTREMITIES:  Peripheral pulses felt.  No edema.  LABORATORY DATA:  EKG shows normal sinus rhythm with ST-T changes comparable to the old EKG, heart rate is around 79 beats per minute and nonspecific ST changes.  CT of the head without contrast shows no acute intracranial abnormality, old bilateral occipital and left cerebellar infarct, stable cerebral atrophy and chronic small vessel disease.  CBC WBCs 5.9,  hemoglobin is 13.8, hematocrit is 41.6, platelets 155.  PT/INR is 13.9 and 1.  Complete metabolic panel sodium 676, potassium 4, chloride 104, carbon dioxide 27, glucose 187, BUN 14, creatinine 1.4, alkaline phos is 85, AST 15, ALT 11, total protein 6.3, albumin 3.2, calcium 8.8, CK is 67, MB is 1.3, troponin 0.01, first is less than 0.05.  ASSESSMENT: 1. Left-side hemiparesis, cerebrovascular accident. 2. History of patent foramen ovale and deep vein thrombosis, on     Coumadin. 3. History of coronary artery disease status post stenting. 4. History of chronic systolic heart failure, last EF measured in     November 2011 was 40%. 5. History of Crohn disease. 6. History of hypertension. 7. Hyperglycemia. 8. History of thin-wall right upper lobe cavity in the lung. 9. History of early dementia. 10.History of previous cerebrovascular accident. 11.History of cigarette smoking.  PLAN: 1. At this time we will admit the patient to telemetry. 2. For his CVA, Dr. Brett Fairy of Neurology has been already consulted     by the ER and I also have discussed with Dr. Brett Fairy.  At this     time, Dr. Brett Fairy is advised to continue Coumadin and stroke     workup including MRI of the brain along with carotid Doppler and 2-     D echo as advised.  No bridging heparin.  The patient will be on     aspirin at this time.  The patient has failed swallow so we will be     getting a formal swallow evaluation. 3. For his hypertension, we will place the patient on p.o. and IV     labetalol and continue his regular home p.o. medication once he     passes swallow. 4. Coumadin per pharmacy.  Aspirin will be given per rectally as the     patient has failed swallow. 5. The patient does have hyperglycemia at this time.  The patient will     be on CBG checks with NovoLog sliding     scale coverage. 6. The patient is a full code. 7. Further recommendation as condition evolves and as per the test      orders.     Rise Patience, MD     ANK/MEDQ  D:  05/17/2010  T:  05/17/2010  Job:  [id number]  cc:   Lucianne Lei, M.D.  Electronically Signed by Gean Birchwood MD on 06/11/2010 04:44:54 PM

## 2010-06-12 NOTE — Letter (Signed)
Summary: Generic Letter  Press photographer, [place]  [address] [place]   [place], [place] [address]   Phone: [phone]  Fax: [phone]        June 07, 2010 MRN: [id number]    [place], [place]  [address]    Dear Mr. BURESH,    Please call the office at your convenience to set up an appt to see  Dr. Percival Spanish.      Sincerely,  Neil Crouch  This letter has been electronically signed by your physician.

## 2010-06-12 NOTE — Medication Information (Signed)
Summary: [facility] Clinic  Anticoagulant Therapy  Managed by: Porfirio Oar, PharmD Referring MD: Olin Pia PCP: Dr. Criss Rosales Supervising MD: Burt Knack MD, Legrand Como Indication 1: CVA (436.0) Indication 2: CHF Lab Used: [facility] Site: [facility] INR POC 1.5 INR RANGE 2.0-3.0  Dietary changes: no    Health status changes: no    Bleeding/hemorrhagic complications: no    Recent/future hospitalizations: yes       Details: pt hospitalized from 2/2-2/17.  INR on discharge was elevated.  Pt had been receiving 7.42m daily.  Discharged on 573mdaily  Any changes in medication regimen? no    Recent/future dental: no  Any missed doses?: no       Is patient compliant with meds? yes       Allergies: No Known Drug Allergies  Anticoagulation Management History:      Positive risk factors for bleeding include an age of [age]ears or older and presence of serious comorbidities.  The bleeding index is 'intermediate risk'.  Positive CHADS2 values include History of CHF, History of HTN, and History of Diabetes.  Negative CHADS2 values include Age > 7569ears old.  His last INR was 2.4.  Anticoagulation responsible provider: CoBurt KnackD, MiLegrand Como INR POC: 1.5.  Exp: 04/2011.    Anticoagulation Management Assessment/Plan:      The patient's current anticoagulation dose is Warfarin sodium 5 mg tabs: Use as directed by Anticoagulation Clinic.  The target INR is 2.0-3.0.  The next INR is due 06/11/2010.  Anticoagulation instructions were given to patient.  Results were reviewed/authorized by SaPorfirio OarPharmD.  He was notified by SaPorfirio OarharmD.         Prior Anticoagulation Instructions: INR 2.4 Continue taking 1 tablet every day. Recheck in 4 weeks.  Current Anticoagulation Instructions: INR 1.5  [facility]ala [facility]'s [facility].  Increase dose to 1 tablet every day except 1 1/2 tablets on Tuesday and Friday.  Recheck INR in 1 week.

## 2010-06-12 NOTE — Progress Notes (Signed)
Summary: Needs post hosp appt  Phone Note Outgoing Call   Call placed by: Devra Dopp, LPN,  May 29, 8004 2:22 PM Call placed to: Patient Summary of Call: ATTEMPTED TO [place] F/U  WITH DR [place]. Initial call taken by: Devra Dopp, LPN,  May 29, 3492 2:23 PM  Follow-up for Phone Call        Lambert, LPN  June 05, 9445 8:24 AM   Spoke with [facility] RN while in pt's home.  Pt is aware to call and make appt.  RN did not have time to stay while I could get his appt made on the phone.  Porfirio Oar PharmD  June 05, 2010 11:47 AM   Additional Follow-up for Phone Call Additional follow up Details #1::        Flag sent to scheduler to have pt schedule a post/hosp appt. Additional Follow-up by: Sim Boast, RN,  June 06, 2010 9:27 AM

## 2010-06-15 ENCOUNTER — Encounter: Payer: Self-pay | Admitting: Cardiovascular Disease

## 2010-06-15 LAB — CONVERTED CEMR LAB: Prothrombin Time: 26.4 s

## 2010-06-18 NOTE — Consult Note (Signed)
NAME:  Grant Lara, Grant Lara                  ACCOUNT NO.:  [id number]  MEDICAL RECORD NO.:  [id number]           PATIENT TYPE:  I  LOCATION:  3015                         FACILITY:  [facility]  PHYSICIAN:  Andrey Spearman, MD   DATE OF BIRTH:  [birth year]  DATE OF CONSULTATION:  05/18/2010 DATE OF DISCHARGE:                                CONSULTATION   TIME:  3 p.m.  REASON FOR CONSULTATION:  Left-sided weakness.  HISTORY OF PRESENT ILLNESS:  This is a 70 year old male with known history of PFO DVT who is on Coumadin, history of systolic heart failure with an EF measured in November 2011 of 40%.  He has a history of CAD status post stenting and history of Crohn disease which is stable at this time.  The patient has previous history of CVA with expressive aphasia.  On Thursday at approximately 3:30 p.m., the patient states that he noted tingling and weakness on his left arm and left leg.  He notices gait was off in addition.  He called EMS and was brought to emergency department around 6:30 p.m.  The patient was not felt to be a candidate for TPA at that time per Dr. Brett Fairy.  Neurology was asked to further evaluate the patient while he was in the hospital.  At the present time, the patient states that he feels stronger in his left arm, but he still feels a slight decreased sensation along his left arm and left leg.  He denies any chest pain, shortness of breath, dysarthria, dysphasia, diplopia, lack of vision, difficulty understanding, or expressing himself.  PAST MEDICAL HISTORY: 1. Hypertension. 2. Diabetes. 3. Hypercholesterolemia. 4. CAD with LAD stent. 5. PFO. 6. PE. 7. DVT. 8. Anemia.  MEDICATIONS:  The patient is on aspirin, Coreg, NovoLog, lisinopril, Coumadin per protocol, Tylenol, and labetalol.  ALLERGIES:  No known drug allergies.  SOCIAL HISTORY:  The patient is married, lives with his family, states he quit smoking this week.  Denies any alcohol or drug  abuse.  FAMILY HISTORY:  Significant for CAD and diabetes.  REVIEW OF SYSTEMS:  Positive for numbness and tingling in his left arm and left leg, weakness in his left arm and left leg.  PHYSICAL EXAMINATION:  VITAL SIGNS:  Blood pressure is 131/70, pulse 56, respiration 18, temperature 98.1. GENERAL:  He is alert and oriented x3.  Carries out two to three step commands. NEUROLOGIC:  Pupils are equal, round, and reactive to light and accommodating.  Eyes are conjugate.  Extraocular movements are intact. Visual fields are grossly intact to visual stimuli bilaterally.  Face is symmetrical.  Tongue is midline.  Uvula is midline.  The patient's speech is slow, but clear.  Facial sensation V1-V3 is grossly intact. Shoulder shrug and head turn is within normal limits. COORDINATION:  The patient's finger-to-nose on the right side was smooth, on the left side was smooth and slow.  Heel-to-shin were bilaterally intact.  Fine motor movements.  The patient is slightly slow on his left compared to his right. GAIT:  The patient had a wide-based slow-moving gait and he feels a sensation that  he is weak, but he actually shows good strength. MOTOR:  The patient shows 5/5 strength throughout.  He is slow to initiating movements, but once the movements are initiated, he shows as stated 5/5 strength.  Tone and bulk are within normal limits.  Deep tendon reflexes are very brisk throughout, 3+ bilaterally in the biceps and triceps, 3+ bilaterally at the patella, 2+ at the Achilles.  He has downgoing toes on both sides.  The patient does show a drift on his left arm and left leg.  The patient's states his sensation is decreased along his left hand, left forearm, and left leg when compared to the right, but globally intact to pinprick, light touch, and vibration. PULMONARY:  Clear to auscultation. CARDIOVASCULAR:  S1 and S2 audible. NECK:  Negative for bruits.  LABORATORY DATA:  Sodium 143, potassium 2.6,  chloride 109, CO2 of 27, BUN 12, creatinine 1.14, glucose 83.  White blood cell 5.6, hemoglobin 12.9, hematocrit 38.3, platelets 169.  PTT 28.  PT 14.1.  His INR is low at 1.07 and his Coumadin is being overseen by pharmacy.  HbA1c is 6.2. Fasting lipid panel shows cholesterol 171, triglycerides 56, HDL 37, LDL 123.  IMAGING:  CT of brain showed acute nonhemorrhagic infarct in the junction of the lateral aspect of the right thalamus and posterior limb of the right internal capsule.  MRI of the brain showed small acute nonhemorrhagic infarct in the lateral aspect of the right thalamus, remote left frontal lobe infarct with encephalomalacia, remote cerebellar infarcts notable on the left, global atrophy most notable in the posterior frontal lobes, occipital lobes, and parietal lobes.  MRA of the brain shows moderate proximal and marked mid right posterior cerebral artery stenosis may contribute to the patient's acute infarct, mild proximal and marked mid distal left posterior cerebral artery stenosis, moderate tandem stenosis of the superior cerebellar arteries bilaterally in the left prominent vertebral artery.  ASSESSMENT:  This is a 70 year old male who has had acute left-sided weakness showing a right-sided thalamic stroke, who is already on Coumadin.  Unfortunately the patient arrived in the emergency room with a subtherapeutic level of Coumadin.  At this time, recommendations would be to continue with glucose control, start a statin, consider 20 mg of Zocor, continue with Coumadin and aspirin.     Etta Quill, PA-C   ______________________________ Andrey Spearman, MD    DS/MEDQ  D:  05/18/2010  T:  05/19/2010  Job:  [id number]  Electronically Signed by Etta Quill PA-C on 05/31/2010 12:50:20 PM Electronically Signed by Andrey Spearman  on 06/18/2010 12:36:28 PM

## 2010-06-21 NOTE — Discharge Summary (Addendum)
NAME:  Grant Lara, Grant Lara                  ACCOUNT NO.:  [id number]  MEDICAL RECORD NO.:  [id number]           PATIENT TYPE:  I  LOCATION:  3015                         FACILITY:  [facility]  PHYSICIAN:  Sheila Oats, M.D.DATE OF BIRTH:  1940/09/11  DATE OF ADMISSION:  05/17/2010 DATE OF DISCHARGE:  05/23/2010                        DISCHARGE SUMMARY - REFERRING   DISCHARGE DIAGNOSES: 1. Small right thalamic acute nonhemorrhagic infarct - lateral aspect     of right thalamus. 2. Hypertension. 3. Systolic heart failure - compensated, ejection fraction 30-35% with     diffuse hypokinesis per 2-D echo of May 18, 2010. 4. Diabetes mellitus - diet controlled with A1c of 6.2 - the patient     to follow up with outpatient physicians. 5. History of remote left frontal infarct with encephalomalacia. 6. History of pulmonary embolus/deep vein thrombosis - on chronic     Coumadin. 7. History of coronary artery disease. 8. History of anemia. 9. History of right upper lobe thin-walled cavity - followed by     Pulmonology/Dr. Elsworth Soho. 10.History of patent foramen ovale. 11.Dementia. 12.History of coronary artery disease and status post previous left     anterior descending artery stenting in [place] in 2005. 13.History of terminal ileal resection in 1994 secondary to Crohn's -     no recurrence since then. 14.History of neck pain secondary to spondylosis. 15.History of right thyroid nodule.  PROCEDURES AND STUDIES: 1. Two-D echocardiogram on May 18, 9448 - systolic function     moderately to severely reduced.  Ejection fraction 30-35%, diffuse     hypokinesis.  The right atrium was mildly dilated. 2. CT scan of brain on May 17, 2010 - no acute intracranial     abnormality.  Old bilateral occipital and left cerebellar infarct.     Stable cerebral atrophy and chronic small vessel disease. 3. MRI on May 18, 2010, - small acute nonhemorrhagic infarct on     lateral aspect of the right  thalamus.  No intracranial hemorrhage. 4. MRA - moderate proximal and marked mid right posterior cerebral     artery stenosis may contribute to the patient's acute infarct. 5. Chest x-ray on May 18, 2010 - mild nodularity in the right     upper lobe along the inferior margin of the previously identified     lesion in this vicinity. 6. Followup CT scan of head on May 18, 2010 - acute nonhemorrhagic     infarct at the junction of the lateral aspect of the right thalamus     and posterior limb of the right internal capsule.  Remote left     frontal lobe infarct with encephalomalacia.  Remote cerebellar     infarcts greater on the left.  Global atrophy, most notable     parietal to occipital lobe and posterior frontal lobes.  Question     of remote infarcts parietal to occipital region with     encephalomalacia.  CONSULTATIONS:  Neurology - Dr. Maryjean Ka. Leonie Man.  BRIEF HISTORY:  The patient is a 70 year old black male with the above- listed medical problems as well as prior history of CVAs  with expressive aphasia in the past who presented with complaints of tingling and weakness in his left arm and left leg and stated that his gait was unsteady.  EMS was called and the patient was brought to the ED. Neurology was consulted in the ED and they evaluated the patient and indicated that he was not a candidate for TPA.  It was noted that he was on chronic Coumadin and Neurology recommended to continue this.  The patient denied any visual symptoms, headaches, chest pain, shortness of breath and no difficulty swallowing.  He was admitted to the Hospitalist Service for further workup and management.  HOSPITAL COURSE: 1. Right thalamic small acute nonhemorrhagic infarct - as discussed     above Neurology was consulted in the ED and they saw the patient     and recommended to continue his Coumadin.  Workup included an MRI     as well as a CT scan and the results as stated above, the  MRI     revealing the acute right thalamic infarct.  A 2-D echocardiogram     was done and revealed an ejection fraction of 30-35% and it was     noted that the patient does have a history of systolic heart     failure and his last ejection fraction in November 2011 was 40%.     He was maintained on the Coumadin, PT/INR monitored and his INR     today is 2.2 and Pharmacy/Rehab MD to continue to monitor his     PT/INR and further dose the Coumadin as appropriate.  The patient     also had a fasting lipid profile which showed a cholesterol of 171     with an LDL of 123, HDL of 37 and he was started on Zocor which he     is to continue upon discharge.  PT, OT was consulted and     recommended CIR for further rehab and the patient will be     transferred to CIR at this time. 2. Systolic heart failure/cardiomyopathy - the patient was maintained     on his Coreg during his hospital stay.  He did not have any     evidence of fluid overload.  Cardiology was consulted per Dr.     Alveta Heimlich as it was noted that his last ejection fraction per the     echo of 2011 was 40% and they saw him and recommended to resume his     lisinopril, the patient had been on Coreg which was continued.     With restarting the patient's lisinopril, he is to have a BMET done     in 2 weeks to follow up on his renal function.  He is also to     follow up with [facility] outpatient. 3. History of PE/DVT - was maintained on his Coumadin during this     hospital stay and his INR today prior to discharge is 2.20.  He is     to have PT/INR in the a.m. for further Coumadin dosing per     MD/Pharmacy p.r.n. 4. Hypertension - he was maintained on his outpatient medications     during this hospital stay and is to continue them upon discharge. 5. Diabetes mellitus - the patient does have a history of diabetes but     has not been on any medications.  His Accu-Cheks were monitored and     he was covered with sliding scale  insulin  during this hospital     stay.  He is to continue modified carbohydrate diet upon discharge.     His hemoglobin A1c was 6.2 during this hospital stay.  He is to     follow up with his outpatient MD for further monitoring and     management as clinically appropriate.  DISCHARGE MEDICATIONS/TRANSFER MEDICATIONS: 1. Tylenol 650 q.4 h. p.r.n. 2. Albuterol 2.5 mg q.2 h. p.r.n. 3. Zocor 20 mg p.o. at bedtime. 4. Coumadin as per med rec form. 5. Carvedilol 6.25 mg one p.o. b.i.d. 6. Lisinopril 10 mg one p.o. at bedtime. 7. Artificial tears 1 drop in both eyes p.r.n.  DISPOSITION:  The patient is being transferred to CIR at this time for further rehab and follow up with his outpatient MDs as well as [facility] and Neurology/Dr. Leonie Man to be indicated at the time that he is discharged from [facility] inpatient rehab.     Sheila Oats, M.D.     ACV/MEDQ  D:  05/23/2010  T:  05/23/2010  Job:  [id number]  cc:   [facility] Lucianne Lei, M.D.  Electronically Signed by Minette Headland M.D. on 06/19/2010 06:00:43 PM Electronically Signed by Minette Headland M.D. on 06/19/2010 07:32:29 PM

## 2010-06-21 NOTE — Medication Information (Signed)
Summary: Coumadin Clinic  Anticoagulant Therapy  Managed by: Freddrick March, RN, BSN Referring MD: Percival Spanish MD, Jeneen Rinks PCP: Dr. Criss Rosales Supervising MD: Angelena Form MD, Harrell Gave Indication 1: CVA (436.0) Indication 2: CHF Lab Used: [facility] [place] Site: [place] [facility] 26.4 INR POC 2.2 INR RANGE 2.0-3.0  Dietary changes: no    Health status changes: no    Bleeding/hemorrhagic complications: no    Recent/future hospitalizations: no    Any changes in medication regimen? no    Recent/future dental: no  Any missed doses?: no       Is patient compliant with meds? yes       Allergies: No Known Drug Allergies  Anticoagulation Management History:      His anticoagulation is being managed by telephone today.  Positive risk factors for bleeding include an age of 2 years or older and presence of serious comorbidities.  The bleeding index is 'intermediate risk'.  Positive CHADS2 values include History of CHF, History of HTN, and History of Diabetes.  Negative CHADS2 values include Age > 5 years old.  His last INR was 2.4.  Prothrombin time is 26.4.  Anticoagulation responsible provider: Angelena Form MD, Harrell Gave.  INR POC: 2.2.  Exp: 04/2011.    Anticoagulation Management Assessment/Plan:      The patient's current anticoagulation dose is Warfarin sodium 5 mg tabs: Use as directed by Anticoagulation Clinic.  The target INR is 2.0-3.0.  The next INR is due 06/29/2010.  Anticoagulation instructions were given to home health nurse.  Results were reviewed/authorized by Freddrick March, RN, BSN.  He was notified by Tula Nakayama, RN, BSN.         Prior Anticoagulation Instructions: INR 2.9 Change dose to 734ms daily except 7.511m on Fridays. Recheck INR in 4 days. CaBangorurse given orders.   Current Anticoagulation Instructions: INR 2.2  Attempted to call pt with results, cellular device unable to receive calls. ErDawon TroopN  June 15, 2010 2:34 PM  [facility] to have pt continue on same dosage 34m49maily except 7.34mg31m Fridays.  Recheck in 2 weeks.  Made pt appt to come into [facility]

## 2010-06-21 NOTE — Medication Information (Signed)
Summary: Coumadin Clinic  Anticoagulant Therapy  Managed by: Tula Nakayama, RN, BSN Referring MD: Percival Spanish MD, Jeneen Rinks PCP: Dr. Criss Rosales Supervising MD: Stanford Breed MD, Aaron Edelman Indication 1: CVA (436.0) Indication 2: CHF Lab Used: [facility] [place] Site: [place] [facility] 34.7 INR POC 2.9 INR RANGE 2.0-3.0  Dietary changes: no    Health status changes: no    Bleeding/hemorrhagic complications: no    Recent/future hospitalizations: no    Any changes in medication regimen? no    Recent/future dental: no  Any missed doses?: no       Is patient compliant with meds? yes       Allergies: No Known Drug Allergies  Anticoagulation Management History:      His anticoagulation is being managed by telephone today.  Positive risk factors for bleeding include an age of 29 years or older and presence of serious comorbidities.  The bleeding index is 'intermediate risk'.  Positive CHADS2 values include History of CHF, History of HTN, and History of Diabetes.  Negative CHADS2 values include Age > 54 years old.  His last INR was 2.4.  Prothrombin time is 34.7.  Anticoagulation responsible provider: Stanford Breed MD, Aaron Edelman.  INR POC: 2.9.    Anticoagulation Management Assessment/Plan:      The patient's current anticoagulation dose is Warfarin sodium 5 mg tabs: Use as directed by Anticoagulation Clinic.  The target INR is 2.0-3.0.  The next INR is due 06/15/2010.  Anticoagulation instructions were given to home health nurse.  Results were reviewed/authorized by Tula Nakayama, RN, BSN.  He was notified by Tula Nakayama, RN, BSN.         Prior Anticoagulation Instructions: INR 1.5  Spoke with Pamala Hurry with [facility] while in pt's home.  Increase dose to 1 tablet every day except 1 1/2 tablets on Tuesday and Friday.  Recheck INR in 1 week.   Current Anticoagulation Instructions: INR 2.9 Change dose to 102ms daily except 7.52m on Fridays. Recheck INR in 4 days. CaShadysideurse given orders.

## 2010-06-25 NOTE — Consult Note (Signed)
NAME:  Grant Lara, Grant Lara                  ACCOUNT NO.:  [id number]  MEDICAL RECORD NO.:  [id number]           PATIENT TYPE:  LOCATION:                                 FACILITY:  PHYSICIAN:  Sarajane Jews, MD     DATE OF BIRTH:  [birth year]  DATE OF CONSULTATION: DATE OF DISCHARGE:                                CONSULTATION   PRIMARY CARE MEDICINE:  Lucianne Lei, MD  REASON FOR ADMISSION:  Left-sided weakness.  PROBLEMS DURING THE HOSPITAL: 1. Small acute nonhemorrhagic infarct on the lateral aspect of the     right thalamus. 2. Remote left frontal infarct with encephalomalacia. 3. Cardiomyopathy. 4. Hypertension. 5. History of pulmonary embolus/deep vein thrombosis. 6. History of coronary artery disease. 7. Hyperglycemia. 8. Anemia.  MEDICATIONS IN THE HOSPITAL: 1. Coreg 6.25 mg p.o. b.i.d. 2. Lovenox 30 mg subcu q.24 h. 3. Lisinopril 2.5 daily. 4. Warfarin per Pharmacy. 5. Labetalol 10 IV q.6 p.r.n.  IMAGES THAT WAS DONE DURING THE HOSPITAL: 1. CT of the head was done on May 17, 2010, which shows no acute     intracranial abnormalities, old bilateral occipital and cerebellar     infarct, stable cerebral atrophy with chronic small vessel disease. 2. An MRI was done on May 22, 2010, which shows small acute     nonhemorrhagic infarct left aspect of the right thalamus, no     intracranial hemorrhage. 3. Chest x-ray was done on May 18, 2010, which shows mild     nodularity on the right upper lobe along with inferior margin with     previously identified lesion in the sella.  Recommend to repeat the     CAT scan in 6 months. 4. A 2-D echo was done which shows estimated ejection fraction was in     the range of 30% to 35%, diffuse hypokinesia.  LABORATORY DATA THAT WAS DONE DURING ADMISSION:  WBC on May 22, 2010, was 5.9, hemoglobin 11.9, platelet was 142,000.  His INR was 2.05.  BRIEF HOSPITAL COURSE:  He is a 70 year old male with known history of PFO, DVT  on Coumadin, history of systolic heart failure with EF in November 2011 was 45%.  He has history of coronary artery disease.  He has previous history of CVA with expressive aphasia.  On Thursday, approximately 3:30 p.m., the patient stated that he noted tingling and weakness in the left arm and left leg gait was off.  In addition, he called EMS who brought him to the emergency room around 6:30 p.m.  The patient was not felt to be a candidate for TPA as per Neurology. Neurology was asked to see the patient and recommended to continue Coumadin at this time and he is not a candidate for TPA, statins Zocor 20% was added.  The patient had a 2-D echo shows EF was 30%, waiting for Cardiology consult to evaluate for cardiomyopathy, may need to evaluate for an ICD.  PHYSICAL EXAMINATION:  GENERAL:  The patient is awake, oriented x3 lying in bed with no acute distress. HEENT:  Normocephalic, atraumatic.  Conjunctiva pink.  Pupils equal, reactive  to light bilaterally.  No wheezes, crackles. ABDOMEN:  Soft.  Bowel sounds normal. EXTREMITIES:  No cyanosis, no edema.  Awake and oriented x3.  Motor 4/5 on the left upper and lower extremities, 5/5 in the right upper and lower extremity. NEUROLOGIC:  Cranial nerves II through XII grossly intact.  HOSPITAL COURSE BY PROBLEM: 1. Small acute nonhemorrhagic infarct on the lateral aspect of the     right thalamus.  As per Neurology, we will continue with Coumadin,     PT and OT.  The patient is being evaluated for CIR, waiting for an     answer.  In the meantime, we are going to continue with statin for     now.  Risk notification 2. Cardiomyopathy.  The patient does have a history of cardiomyopathy.     His last EF was 45%, at this time his EF was 30%.  He is on Coreg,     aspirin, statin, added lisinopril for afterload reduction 2.5,     check a CMP in 2 weeks, also I got a cardiology consult with     [facility] to evaluate for possible need of  AICD. 3. History of PE/DVT.  Resume Coumadin.  His INR is 2.05 as per     Pharmacy. 4. History of cigarette smoking, 2 pack.  Smoking cessation was     advised. 5. History of hypertension.  His blood pressure is fairly controlled     with the current regimen of Coreg and lisinopril. 6. Disposition.  Pending.          ______________________________ Sarajane Jews, MD     SA/MEDQ  D:  05/22/2010  T:  05/22/2010  Job:  [id number]  Electronically Signed by Sarajane Jews MD on 06/25/2010 05:54:25 PM

## 2010-06-26 LAB — CBC
HCT: 39.6 % (ref 39.0–52.0)
Hemoglobin: 12.6 g/dL — ABNORMAL LOW (ref 13.0–17.0)
Hemoglobin: 12.6 g/dL — ABNORMAL LOW (ref 13.0–17.0)
Hemoglobin: 12.6 g/dL — ABNORMAL LOW (ref 13.0–17.0)
MCH: 29.4 pg (ref 26.0–34.0)
MCH: 30.1 pg (ref 26.0–34.0)
MCH: 30.2 pg (ref 26.0–34.0)
MCH: 30.2 pg (ref 26.0–34.0)
MCH: 30.3 pg (ref 26.0–34.0)
MCH: 30.8 pg (ref 26.0–34.0)
MCHC: 31.8 g/dL (ref 30.0–36.0)
MCHC: 32.7 g/dL (ref 30.0–36.0)
MCHC: 32.8 g/dL (ref 30.0–36.0)
MCHC: 33.1 g/dL (ref 30.0–36.0)
MCHC: 33.3 g/dL (ref 30.0–36.0)
MCV: 90.1 fL (ref 78.0–100.0)
MCV: 91.7 fL (ref 78.0–100.0)
MCV: 92.3 fL (ref 78.0–100.0)
MCV: 92.6 fL (ref 78.0–100.0)
Platelets: 157 10*3/uL (ref 150–400)
Platelets: 160 10*3/uL (ref 150–400)
Platelets: 163 10*3/uL (ref 150–400)
Platelets: 183 10*3/uL (ref 150–400)
Platelets: 197 10*3/uL (ref 150–400)
Platelets: 204 10*3/uL (ref 150–400)
Platelets: 214 10*3/uL (ref 150–400)
Platelets: 215 10*3/uL (ref 150–400)
Platelets: 234 10*3/uL (ref 150–400)
RBC: 4.18 MIL/uL — ABNORMAL LOW (ref 4.22–5.81)
RBC: 4.41 MIL/uL (ref 4.22–5.81)
RBC: 4.45 MIL/uL (ref 4.22–5.81)
RBC: 4.46 MIL/uL (ref 4.22–5.81)
RBC: 4.55 MIL/uL (ref 4.22–5.81)
RDW: 12.8 % (ref 11.5–15.5)
RDW: 13 % (ref 11.5–15.5)
RDW: 13.3 % (ref 11.5–15.5)
RDW: 13.3 % (ref 11.5–15.5)
RDW: 13.4 % (ref 11.5–15.5)
WBC: 6.4 10*3/uL (ref 4.0–10.5)
WBC: 6.6 10*3/uL (ref 4.0–10.5)
WBC: 7.5 10*3/uL (ref 4.0–10.5)
WBC: 7.5 10*3/uL (ref 4.0–10.5)
WBC: 7.6 10*3/uL (ref 4.0–10.5)

## 2010-06-26 LAB — COMPREHENSIVE METABOLIC PANEL WITH GFR
ALT: 8 U/L (ref 0–53)
AST: 14 U/L (ref 0–37)
CO2: 25 meq/L (ref 19–32)
Calcium: 7.7 mg/dL — ABNORMAL LOW (ref 8.4–10.5)
Chloride: 99 meq/L (ref 96–112)
GFR calc Af Amer: >60 mL/min (ref >60)
GFR calc non Af Amer: 57 mL/min — ABNORMAL LOW (ref >60)
Potassium: 3.8 meq/L (ref 3.5–5.1)
Sodium: 130 meq/L — ABNORMAL LOW (ref 135–145)

## 2010-06-26 LAB — GLUCOSE, CAPILLARY
Glucose-Capillary: 100 mg/dL — ABNORMAL HIGH (ref 70–99)
Glucose-Capillary: 105 mg/dL — ABNORMAL HIGH (ref 70–99)
Glucose-Capillary: 105 mg/dL — ABNORMAL HIGH (ref 70–99)
Glucose-Capillary: 106 mg/dL — ABNORMAL HIGH (ref 70–99)
Glucose-Capillary: 110 mg/dL — ABNORMAL HIGH (ref 70–99)
Glucose-Capillary: 111 mg/dL — ABNORMAL HIGH (ref 70–99)
Glucose-Capillary: 112 mg/dL — ABNORMAL HIGH (ref 70–99)
Glucose-Capillary: 112 mg/dL — ABNORMAL HIGH (ref 70–99)
Glucose-Capillary: 113 mg/dL — ABNORMAL HIGH (ref 70–99)
Glucose-Capillary: 114 mg/dL — ABNORMAL HIGH (ref 70–99)
Glucose-Capillary: 116 mg/dL — ABNORMAL HIGH (ref 70–99)
Glucose-Capillary: 116 mg/dL — ABNORMAL HIGH (ref 70–99)
Glucose-Capillary: 117 mg/dL — ABNORMAL HIGH (ref 70–99)
Glucose-Capillary: 119 mg/dL — ABNORMAL HIGH (ref 70–99)
Glucose-Capillary: 120 mg/dL — ABNORMAL HIGH (ref 70–99)
Glucose-Capillary: 124 mg/dL — ABNORMAL HIGH (ref 70–99)
Glucose-Capillary: 126 mg/dL — ABNORMAL HIGH (ref 70–99)
Glucose-Capillary: 126 mg/dL — ABNORMAL HIGH (ref 70–99)
Glucose-Capillary: 138 mg/dL — ABNORMAL HIGH (ref 70–99)
Glucose-Capillary: 144 mg/dL — ABNORMAL HIGH (ref 70–99)
Glucose-Capillary: 145 mg/dL — ABNORMAL HIGH (ref 70–99)
Glucose-Capillary: 146 mg/dL — ABNORMAL HIGH (ref 70–99)
Glucose-Capillary: 147 mg/dL — ABNORMAL HIGH (ref 70–99)
Glucose-Capillary: 156 mg/dL — ABNORMAL HIGH (ref 70–99)
Glucose-Capillary: 61 mg/dL — ABNORMAL LOW (ref 70–99)
Glucose-Capillary: 65 mg/dL — ABNORMAL LOW (ref 70–99)
Glucose-Capillary: 75 mg/dL (ref 70–99)
Glucose-Capillary: 75 mg/dL (ref 70–99)
Glucose-Capillary: 76 mg/dL (ref 70–99)
Glucose-Capillary: 78 mg/dL (ref 70–99)
Glucose-Capillary: 79 mg/dL (ref 70–99)
Glucose-Capillary: 79 mg/dL (ref 70–99)
Glucose-Capillary: 82 mg/dL (ref 70–99)
Glucose-Capillary: 83 mg/dL (ref 70–99)
Glucose-Capillary: 85 mg/dL (ref 70–99)
Glucose-Capillary: 85 mg/dL (ref 70–99)
Glucose-Capillary: 86 mg/dL (ref 70–99)
Glucose-Capillary: 87 mg/dL (ref 70–99)
Glucose-Capillary: 90 mg/dL (ref 70–99)
Glucose-Capillary: 90 mg/dL (ref 70–99)
Glucose-Capillary: 93 mg/dL (ref 70–99)
Glucose-Capillary: 94 mg/dL (ref 70–99)
Glucose-Capillary: 96 mg/dL (ref 70–99)
Glucose-Capillary: 97 mg/dL (ref 70–99)
Glucose-Capillary: 97 mg/dL (ref 70–99)

## 2010-06-26 LAB — COMPREHENSIVE METABOLIC PANEL
Albumin: 2.5 g/dL — ABNORMAL LOW (ref 3.5–5.2)
Albumin: 2.7 g/dL — ABNORMAL LOW (ref 3.5–5.2)
Alkaline Phosphatase: 80 U/L (ref 39–117)
BUN: 13 mg/dL (ref 6–23)
BUN: 9 mg/dL (ref 6–23)
Creatinine, Ser: 1.2 mg/dL (ref 0.4–1.5)
Creatinine, Ser: 1.25 mg/dL (ref 0.4–1.5)
Glucose, Bld: 405 mg/dL — ABNORMAL HIGH (ref 70–99)
Total Bilirubin: 0.5 mg/dL (ref 0.3–1.2)
Total Protein: 5.7 g/dL — ABNORMAL LOW (ref 6.0–8.3)
Total Protein: 6 g/dL (ref 6.0–8.3)

## 2010-06-26 LAB — URINALYSIS, ROUTINE W REFLEX MICROSCOPIC
Bilirubin Urine: NEGATIVE
Glucose, UA: NEGATIVE mg/dL
Glucose, UA: NEGATIVE mg/dL
Hgb urine dipstick: NEGATIVE
Ketones, ur: NEGATIVE mg/dL
Leukocytes, UA: NEGATIVE
Specific Gravity, Urine: 1.02 (ref 1.005–1.030)
pH: 6 (ref 5.0–8.0)
pH: 7 (ref 5.0–8.0)

## 2010-06-26 LAB — BASIC METABOLIC PANEL
BUN: 12 mg/dL (ref 6–23)
BUN: 15 mg/dL (ref 6–23)
BUN: 17 mg/dL (ref 6–23)
CO2: 26 mEq/L (ref 19–32)
CO2: 27 mEq/L (ref 19–32)
CO2: 27 mEq/L (ref 19–32)
Calcium: 8.5 mg/dL (ref 8.4–10.5)
Calcium: 8.6 mg/dL (ref 8.4–10.5)
Calcium: 8.7 mg/dL (ref 8.4–10.5)
Calcium: 8.9 mg/dL (ref 8.4–10.5)
Creatinine, Ser: 1.29 mg/dL (ref 0.4–1.5)
Creatinine, Ser: 1.35 mg/dL (ref 0.4–1.5)
Creatinine, Ser: 1.38 mg/dL (ref 0.4–1.5)
Creatinine, Ser: 1.38 mg/dL (ref 0.4–1.5)
GFR calc Af Amer: >60 mL/min (ref >60)
GFR calc Af Amer: >60 mL/min (ref >60)
GFR calc non Af Amer: 51 mL/min — ABNORMAL LOW (ref >60)
GFR calc non Af Amer: 51 mL/min — ABNORMAL LOW (ref >60)
GFR calc non Af Amer: 52 mL/min — ABNORMAL LOW (ref >60)
GFR calc non Af Amer: 53 mL/min — ABNORMAL LOW (ref >60)
GFR calc non Af Amer: 55 mL/min — ABNORMAL LOW (ref >60)
Glucose, Bld: 141 mg/dL — ABNORMAL HIGH (ref 70–99)
Glucose, Bld: 85 mg/dL (ref 70–99)
Sodium: 135 mEq/L (ref 135–145)
Sodium: 138 mEq/L (ref 135–145)
Sodium: 138 mEq/L (ref 135–145)
Sodium: 139 mEq/L (ref 135–145)

## 2010-06-26 LAB — PROTIME-INR
INR: 1.49 (ref 0.00–1.49)
INR: 1.89 — ABNORMAL HIGH (ref 0.00–1.49)
INR: 1.98 — ABNORMAL HIGH (ref 0.00–1.49)
INR: 2.27 — ABNORMAL HIGH (ref 0.00–1.49)
INR: 2.34 — ABNORMAL HIGH (ref 0.00–1.49)
INR: 2.41 — ABNORMAL HIGH (ref 0.00–1.49)
INR: 2.51 — ABNORMAL HIGH (ref 0.00–1.49)
Prothrombin Time: 15.5 seconds — ABNORMAL HIGH (ref 11.6–15.2)
Prothrombin Time: 16.3 seconds — ABNORMAL HIGH (ref 11.6–15.2)
Prothrombin Time: 18.2 seconds — ABNORMAL HIGH (ref 11.6–15.2)
Prothrombin Time: 19.2 seconds — ABNORMAL HIGH (ref 11.6–15.2)
Prothrombin Time: 22.7 seconds — ABNORMAL HIGH (ref 11.6–15.2)
Prothrombin Time: 24.8 seconds — ABNORMAL HIGH (ref 11.6–15.2)
Prothrombin Time: 25.2 seconds — ABNORMAL HIGH (ref 11.6–15.2)
Prothrombin Time: 25.8 seconds — ABNORMAL HIGH (ref 11.6–15.2)
Prothrombin Time: 26.4 seconds — ABNORMAL HIGH (ref 11.6–15.2)
Prothrombin Time: 27.2 seconds — ABNORMAL HIGH (ref 11.6–15.2)

## 2010-06-26 LAB — AFB CULTURE WITH SMEAR (NOT AT ARMC)
Acid Fast Smear: NONE SEEN
Acid Fast Smear: NONE SEEN

## 2010-06-26 LAB — FOLATE RBC: RBC Folate: 377 ng/mL (ref 180–600)

## 2010-06-26 LAB — URINE CULTURE
Culture  Setup Time: 201111070014
Culture  Setup Time: 201111112228

## 2010-06-26 LAB — DIFFERENTIAL
Basophils Absolute: 0 10*3/uL (ref 0.0–0.1)
Basophils Absolute: 0 10*3/uL (ref 0.0–0.1)
Basophils Relative: 0 % (ref 0–1)
Eosinophils Absolute: 0.2 10*3/uL (ref 0.0–0.7)
Eosinophils Relative: 3 % (ref 0–5)
Lymphocytes Relative: 26 % (ref 12–46)
Lymphocytes Relative: 32 % (ref 12–46)
Lymphs Abs: 2.4 10*3/uL (ref 0.7–4.0)
Monocytes Absolute: 1.2 10*3/uL — ABNORMAL HIGH (ref 0.1–1.0)
Monocytes Relative: 14 % — ABNORMAL HIGH (ref 3–12)
Neutro Abs: 3.5 10*3/uL (ref 1.7–7.7)
Neutro Abs: 4 10*3/uL (ref 1.7–7.7)
Neutro Abs: 4.6 10*3/uL (ref 1.7–7.7)
Neutrophils Relative %: 57 % (ref 43–77)
Neutrophils Relative %: 58 % (ref 43–77)

## 2010-06-26 LAB — FUNGUS CULTURE W SMEAR

## 2010-06-26 LAB — PHOSPHORUS
Phosphorus: 2.7 mg/dL (ref 2.3–4.6)
Phosphorus: 3.2 mg/dL (ref 2.3–4.6)

## 2010-06-26 LAB — URINE MICROSCOPIC-ADD ON

## 2010-06-26 LAB — HEPARIN LEVEL (UNFRACTIONATED)
Heparin Unfractionated: 0.16 IU/mL — ABNORMAL LOW (ref 0.30–0.70)
Heparin Unfractionated: 0.66 IU/mL (ref 0.30–0.70)
Heparin Unfractionated: <0.1 IU/mL — ABNORMAL LOW (ref 0.30–0.70)
Heparin Unfractionated: <0.1 IU/mL — ABNORMAL LOW (ref 0.30–0.70)
Heparin Unfractionated: <0.1 IU/mL — ABNORMAL LOW (ref 0.30–0.70)

## 2010-06-26 LAB — SEDIMENTATION RATE: Sed Rate: 18 mm/hr — ABNORMAL HIGH (ref 0–16)

## 2010-06-26 LAB — D-DIMER, QUANTITATIVE: D-Dimer, Quant: 0.22 ug/mL-FEU (ref 0.00–0.48)

## 2010-06-26 LAB — APTT: aPTT: 41 seconds — ABNORMAL HIGH (ref 24–37)

## 2010-06-26 LAB — CULTURE, RESPIRATORY W GRAM STAIN: Gram Stain: NONE SEEN

## 2010-06-26 LAB — HEPARIN ANTI-XA: Heparin LMW: <0.1 IU/mL

## 2010-06-26 LAB — POCT CARDIAC MARKERS
CKMB, poc: <1 ng/mL — ABNORMAL LOW (ref 1.0–8.0)
Myoglobin, poc: 52.9 ng/mL (ref 12–200)
Troponin i, poc: <0.05 ng/mL (ref 0.00–0.09)

## 2010-06-26 LAB — LIPID PANEL
Cholesterol: 155 mg/dL (ref 0–200)
HDL: 40 mg/dL (ref >39)
Total CHOL/HDL Ratio: 3.9 RATIO

## 2010-06-26 LAB — RPR: RPR Ser Ql: NONREACTIVE

## 2010-06-26 LAB — HEMOGLOBIN A1C
Hgb A1c MFr Bld: 6.2 % — ABNORMAL HIGH (ref <5.7)
Mean Plasma Glucose: 131 mg/dL — ABNORMAL HIGH (ref <117)

## 2010-06-26 LAB — MAGNESIUM: Magnesium: 1.9 mg/dL (ref 1.5–2.5)

## 2010-07-02 ENCOUNTER — Emergency Department (HOSPITAL_COMMUNITY): Payer: No Typology Code available for payment source

## 2010-07-02 ENCOUNTER — Encounter (INDEPENDENT_AMBULATORY_CARE_PROVIDER_SITE_OTHER): Payer: No Typology Code available for payment source

## 2010-07-02 ENCOUNTER — Inpatient Hospital Stay (HOSPITAL_COMMUNITY)
Admission: EM | Admit: 2010-07-02 | Discharge: 2010-07-05 | DRG: 069 | Disposition: A | Discharge status: Home / Self Care | Payer: No Typology Code available for payment source | Source: Ambulatory Visit | Attending: Internal Medicine | Admitting: Internal Medicine

## 2010-07-02 ENCOUNTER — Encounter: Payer: Self-pay | Admitting: Internal Medicine

## 2010-07-02 ENCOUNTER — Encounter (HOSPITAL_COMMUNITY): Payer: Self-pay | Admitting: Radiology

## 2010-07-02 DIAGNOSIS — E785 Hyperlipidemia, unspecified: Secondary | ICD-10-CM | POA: Diagnosis present

## 2010-07-02 DIAGNOSIS — Z7901 Long term (current) use of anticoagulants: Secondary | ICD-10-CM

## 2010-07-02 DIAGNOSIS — Z87891 Personal history of nicotine dependence: Secondary | ICD-10-CM

## 2010-07-02 DIAGNOSIS — G459 Transient cerebral ischemic attack, unspecified: Principal | ICD-10-CM | POA: Diagnosis present

## 2010-07-02 DIAGNOSIS — I959 Hypotension, unspecified: Secondary | ICD-10-CM | POA: Diagnosis present

## 2010-07-02 DIAGNOSIS — I498 Other specified cardiac arrhythmias: Secondary | ICD-10-CM | POA: Diagnosis present

## 2010-07-02 DIAGNOSIS — Z7982 Long term (current) use of aspirin: Secondary | ICD-10-CM

## 2010-07-02 DIAGNOSIS — I2510 Atherosclerotic heart disease of native coronary artery without angina pectoris: Secondary | ICD-10-CM | POA: Diagnosis present

## 2010-07-02 DIAGNOSIS — I509 Heart failure, unspecified: Secondary | ICD-10-CM | POA: Diagnosis present

## 2010-07-02 DIAGNOSIS — Q250 Patent ductus arteriosus: Secondary | ICD-10-CM

## 2010-07-02 DIAGNOSIS — I5022 Chronic systolic (congestive) heart failure: Secondary | ICD-10-CM | POA: Diagnosis present

## 2010-07-02 DIAGNOSIS — I619 Nontraumatic intracerebral hemorrhage, unspecified: Secondary | ICD-10-CM

## 2010-07-02 HISTORY — DX: Essential (primary) hypertension: I10

## 2010-07-02 LAB — CBC
HCT: 38.3 % — ABNORMAL LOW (ref 39.0–52.0)
MCHC: 32.6 g/dL (ref 30.0–36.0)
MCV: 92.1 fL (ref 78.0–100.0)
RDW: 13.4 % (ref 11.5–15.5)

## 2010-07-02 LAB — DIFFERENTIAL
Eosinophils Relative: 4 % (ref 0–5)
Lymphocytes Relative: 38 % (ref 12–46)
Lymphs Abs: 2 10*3/uL (ref 0.7–4.0)
Monocytes Absolute: 0.7 10*3/uL (ref 0.1–1.0)

## 2010-07-02 LAB — URINALYSIS, ROUTINE W REFLEX MICROSCOPIC
Bilirubin Urine: NEGATIVE
Glucose, UA: NEGATIVE mg/dL
Hgb urine dipstick: NEGATIVE
Ketones, ur: NEGATIVE mg/dL
Protein, ur: NEGATIVE mg/dL

## 2010-07-02 LAB — COMPREHENSIVE METABOLIC PANEL
ALT: 12 U/L (ref 0–53)
AST: 15 U/L (ref 0–37)
Alkaline Phosphatase: 80 U/L (ref 39–117)
CO2: 28 mEq/L (ref 19–32)
Glucose, Bld: 124 mg/dL — ABNORMAL HIGH (ref 70–99)
Potassium: 4 mEq/L (ref 3.5–5.1)
Sodium: 137 mEq/L (ref 135–145)
Total Protein: 6.4 g/dL (ref 6.0–8.3)

## 2010-07-02 LAB — POCT CARDIAC MARKERS: CKMB, poc: <1 ng/mL — ABNORMAL LOW (ref 1.0–8.0)

## 2010-07-03 ENCOUNTER — Inpatient Hospital Stay (HOSPITAL_COMMUNITY): Payer: No Typology Code available for payment source

## 2010-07-03 LAB — PROTIME-INR
INR: 1.25 (ref 0.00–1.49)
Prothrombin Time: 15.9 s — ABNORMAL HIGH (ref 11.6–15.2)

## 2010-07-03 LAB — TROPONIN I

## 2010-07-03 LAB — GLUCOSE, CAPILLARY
Glucose-Capillary: 131 mg/dL — ABNORMAL HIGH (ref 70–99)
Glucose-Capillary: 133 mg/dL — ABNORMAL HIGH (ref 70–99)

## 2010-07-03 LAB — CK TOTAL AND CKMB (NOT AT ARMC)
CK, MB: 1.4 ng/mL (ref 0.3–4.0)
Relative Index: INVALID (ref 0.0–2.5)
Total CK: 91 U/L (ref 7–232)

## 2010-07-03 LAB — LIPID PANEL
HDL: 47 mg/dL
Total CHOL/HDL Ratio: 3.3 ratio
Triglycerides: 70 mg/dL
VLDL: 14 mg/dL (ref 0–40)

## 2010-07-03 LAB — HEPARIN LEVEL (UNFRACTIONATED): Heparin Unfractionated: 0.15 IU/mL — ABNORMAL LOW (ref 0.30–0.70)

## 2010-07-03 NOTE — H&P (Signed)
NAME:  Grant Lara, Grant Lara                  ACCOUNT NO.:  [id number]  MEDICAL RECORD NO.:  [id number]           PATIENT TYPE:  E  LOCATION:  [facility]                         FACILITY:  [facility]  PHYSICIAN:  Thornton Dales, MD   DATE OF BIRTH:  1940/09/16  DATE OF ADMISSION:  07/02/2010 DATE OF DISCHARGE:                             HISTORY & PHYSICAL   CHIEF COMPLAINTS:  Confusion, speech problems, and left-sided weakness.  HISTORY OF PRESENT ILLNESS:  This is a pleasant 70 year old African American man who was just treated for CVA in February of this year and went to rehab and was just discharged from rehab few weeks ago.  At that time, he had right thalamic CVA and extensive workup was done while he was in the hospital, which showed patent foramen ovale and EF of 35% with diffuse hypokinesis.  The patient was discharged on Coumadin with therapeutic INR.  He returned today with confusion and increased weakness on his left side and talking out of his head in a post office. The patient did not have any chest pain, shortness of breath, nausea, or vomiting.  He admitted that he has been poorly compliant with his Coumadin.  He denies any seizure activity.  In the emergency room, his initial workup showed no evidence of new stroke on CAT scan and his symptoms seem to have improved by the time I saw him.  His case was discussed with the Neurology Service who recommended the patient to get an MRI to rule out any acute stroke.  Neurology will also be available for consultation if needed.  The patient is a poor historian and there was no family member present during my evaluation.  PAST MEDICAL HISTORY: 1. 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 3. Deep vein thrombosis, unsure of the exact location at this time. 4. Congestive heart failure with ejection fraction of 35%. 5. Hyperlipidemia. 6. High blood pressure. 7. Coronary artery disease. 8. History of tobacco abuse.  MEDICATIONS:   Coumadin, lisinopril, Cardizem, extensive list unavailable at this time, dosing and frequency needs to be verified.  ALLERGIES:  None.  SOCIAL HISTORY:  No smoking currently, he the quit smoking 2 months ago after smoking for several years.  No alcohol or drug use.  FAMILY HISTORY:  Noncontributory.  REVIEW OF SYSTEMS:  A 10-point review of systems is negative except as above.  PHYSICAL EXAMINATION:  VITAL SIGNS:  Blood pressure is 152/68, pulse 52, respirations 17, temperature 97.8. GENERAL:  The patient is lying in bed, comfortable, appeared to be in no distress. HEENT:  Pallor.  Extraocular muscles are intact. NECK:  Supple.  No JVD, adenopathy, or thyromegaly. LUNGS:  Clear to auscultation bilaterally. HEART:  S1, S2, regular rate and rhythm.  No murmurs, rubs, or gallops. ABDOMEN:  Full, soft, nontender, bowel sounds present.  No masses. EXTREMITIES:  No edema, clubbing, or cyanosis. NEUROLOGIC:  Slight weakness on the left side.  Speech appeared clear during my exam.  Gait was not tested.  Coordination appeared preserved. Cranial nerves appear normal.  LABORATORY DATA:  INR is 1.06.  Urinalysis unremarkable.  Chest  x-ray showed a small nodular opacity in the right upper lobe, which is not changed from previous.  Head CT showed no acute intracranial abnormalities.  There is remote bilateral cerebral and cerebellar infarct which is stable and a right thalamic infarct, which is not changed, it is actually less apparent compared to previous study. Chemistry showed creatinine 1.52, glucose 124.  Other parameters appear unremarkable.  EKG showed normal sinus rhythm with left ventricular hypertrophy.  ASSESSMENT: 1. This is a 70 year old man with history of recent cerebrovascular     accident, presenting again with symptoms suggestive of a new     cerebrovascular accident. 2. History of deep venous thrombosis, patent forearm ovale on Coumadin     with subtherapeutic INR. 3.  High blood pressure which appear fairly stable. 4. Confusion, which is currently resolved. 5. Diabetes mellitus, which is controlled.  PLAN:  Admit to Stroke Unit on telemetry.  The patient should get neuro checks every 2 hours for the next 12 hours.  He will be placed on full- dose Lovenox and maintain on his Coumadin till INR is therapeutic.  We will recommend getting a therapeutic INR before discharge.  The patient will also be continued on his other medicines, which I do not have a full list at this time.  Going by his last discharge summary, he was on Coumadin, Coreg, lisinopril, Zocor, and NicoDerm patch, most of this will be continued.  Neurology will be consulted if needed.  MRI and MRA of the brain will be obtained to rule out an acute stroke.  I would not repeat the 2-D echo and carotid Doppler at this time as this was done in the past 1 month.  Please note that his carotid Doppler was unremarkable during his last admission.  His condition overall is stable.     Thornton Dales, MD     FA/MEDQ  D:  07/03/2010  T:  07/03/2010  Job:  [id number]  Electronically Signed by Thornton Dales  on 07/03/2010 02:19:22 AM

## 2010-07-04 LAB — GLUCOSE, CAPILLARY: Glucose-Capillary: 105 mg/dL — ABNORMAL HIGH (ref 70–99)

## 2010-07-04 LAB — HEPARIN LEVEL (UNFRACTIONATED): Heparin Unfractionated: 0.79 IU/mL — ABNORMAL HIGH (ref 0.30–0.70)

## 2010-07-04 LAB — CBC
HCT: 36.9 % — ABNORMAL LOW (ref 39.0–52.0)
Hemoglobin: 12.2 g/dL — ABNORMAL LOW (ref 13.0–17.0)
MCHC: 33.1 g/dL (ref 30.0–36.0)
WBC: 5.7 10*3/uL (ref 4.0–10.5)

## 2010-07-04 LAB — PROTIME-INR
INR: 1.36 (ref 0.00–1.49)
Prothrombin Time: 17 seconds — ABNORMAL HIGH (ref 11.6–15.2)

## 2010-07-05 LAB — GLUCOSE, CAPILLARY
Glucose-Capillary: 85 mg/dL (ref 70–99)
Glucose-Capillary: 109 mg/dL — ABNORMAL HIGH (ref 70–99)

## 2010-07-05 LAB — BASIC METABOLIC PANEL
Calcium: 8.1 mg/dL — ABNORMAL LOW (ref 8.4–10.5)
Creatinine, Ser: 1.27 mg/dL (ref 0.4–1.5)
GFR calc Af Amer: >60 mL/min (ref >60)
GFR calc non Af Amer: 56 mL/min — ABNORMAL LOW (ref >60)
Sodium: 138 mEq/L (ref 135–145)

## 2010-07-05 LAB — CBC
Platelets: 166 10*3/uL (ref 150–400)
RBC: 4.05 MIL/uL — ABNORMAL LOW (ref 4.22–5.81)
RDW: 13.3 % (ref 11.5–15.5)
WBC: 5.6 10*3/uL (ref 4.0–10.5)

## 2010-07-05 LAB — PROTIME-INR: INR: 1.37 (ref 0.00–1.49)

## 2010-07-09 ENCOUNTER — Ambulatory Visit (INDEPENDENT_AMBULATORY_CARE_PROVIDER_SITE_OTHER): Payer: No Typology Code available for payment source | Admitting: *Deleted

## 2010-07-09 DIAGNOSIS — I639 Cerebral infarction, unspecified: Secondary | ICD-10-CM

## 2010-07-09 DIAGNOSIS — I2699 Other pulmonary embolism without acute cor pulmonale: Secondary | ICD-10-CM

## 2010-07-09 DIAGNOSIS — I82409 Acute embolism and thrombosis of unspecified deep veins of unspecified lower extremity: Secondary | ICD-10-CM

## 2010-07-09 DIAGNOSIS — I6350 Cerebral infarction due to unspecified occlusion or stenosis of unspecified cerebral artery: Secondary | ICD-10-CM

## 2010-07-09 LAB — POCT INR: INR: 2.1

## 2010-07-09 NOTE — Patient Instructions (Signed)
INR 2.1  Continue taking your coumadin with 1 tablet (5 mg) daily EXCEPT for 1 1/2 tablets (7.5 mg) on Fridays only.

## 2010-07-10 ENCOUNTER — Inpatient Hospital Stay: Payer: No Typology Code available for payment source | Admitting: Physical Medicine & Rehabilitation

## 2010-07-12 NOTE — Medication Information (Signed)
Summary: rov/ewj  Anticoagulant Therapy  Managed by: Tula Nakayama, RN, BSN PCP: Dr. Donnalee Curry MD: Harrington Challenger MD, Nevin Bloodgood Lab Used: [facility] Site: [place] INR POC 1.1 INR RANGE 2.0-3.0  Dietary changes: no    Health status changes: no    Bleeding/hemorrhagic complications: no    Recent/future hospitalizations: no    Any changes in medication regimen? no    Recent/future dental: no  Any missed doses?: yes     Details: Pt states he hasn't taken any meds this week. He hasn't taken any medication   Is patient compliant with meds? yes      t  Allergies: No Known Drug Allergies  Anticoagulation Management History:      The patient is taking warfarin and comes in today for a routine follow up visit.  Positive risk factors for bleeding include an age of 72 years or older.  The bleeding index is 'intermediate risk'.  Negative CHADS2 values include Age > 69 years old.  Anticoagulation responsible provider: Harrington Challenger MD, Nevin Bloodgood.  INR POC: 1.1.  Cuvette Lot#: [id number].  Exp: 06/2011.    Anticoagulation Management Assessment/Plan:      The patient's current anticoagulation dose is Warfarin sodium 5 mg tabs: Use as directed by coumadin clinic.  The next INR is due 07/13/2010.  Anticoagulation instructions were given to patient.  Results were reviewed/authorized by Tula Nakayama, RN, BSN.  He was notified by Tula Nakayama, RN, BSN.         Current Anticoagulation Instructions: INR 1.1 Today take 31ms then resume 551m daily except 7.79m24mon fridays. Recheck in 10 days.

## 2010-07-13 ENCOUNTER — Encounter: Payer: No Typology Code available for payment source | Admitting: *Deleted

## 2010-07-16 ENCOUNTER — Ambulatory Visit (INDEPENDENT_AMBULATORY_CARE_PROVIDER_SITE_OTHER): Payer: No Typology Code available for payment source | Admitting: *Deleted

## 2010-07-16 DIAGNOSIS — I82409 Acute embolism and thrombosis of unspecified deep veins of unspecified lower extremity: Secondary | ICD-10-CM

## 2010-07-16 DIAGNOSIS — I2699 Other pulmonary embolism without acute cor pulmonale: Secondary | ICD-10-CM

## 2010-07-16 DIAGNOSIS — I639 Cerebral infarction, unspecified: Secondary | ICD-10-CM

## 2010-07-16 DIAGNOSIS — I6350 Cerebral infarction due to unspecified occlusion or stenosis of unspecified cerebral artery: Secondary | ICD-10-CM

## 2010-07-16 DIAGNOSIS — Z7901 Long term (current) use of anticoagulants: Secondary | ICD-10-CM

## 2010-07-16 NOTE — Patient Instructions (Signed)
INR 1.8 Today take 2 tablets (78m).  Then, begin taking 1 tablet (527m daily, except take 1 1/2 tablets on Wednesdays and Fridays. Recheck in 2 weeks.

## 2010-07-26 ENCOUNTER — Ambulatory Visit (INDEPENDENT_AMBULATORY_CARE_PROVIDER_SITE_OTHER): Payer: No Typology Code available for payment source | Admitting: *Deleted

## 2010-07-26 DIAGNOSIS — I639 Cerebral infarction, unspecified: Secondary | ICD-10-CM

## 2010-07-26 DIAGNOSIS — I6350 Cerebral infarction due to unspecified occlusion or stenosis of unspecified cerebral artery: Secondary | ICD-10-CM

## 2010-07-26 DIAGNOSIS — I82409 Acute embolism and thrombosis of unspecified deep veins of unspecified lower extremity: Secondary | ICD-10-CM

## 2010-07-26 DIAGNOSIS — I2699 Other pulmonary embolism without acute cor pulmonale: Secondary | ICD-10-CM

## 2010-07-27 NOTE — Discharge Summary (Signed)
NAME:  Grant Lara, Grant Lara                  Grant Lara NO.:  [id number]  MEDICAL RECORD NO.:  [id number]           PATIENT TYPE:  I  LOCATION:  5056                         FACILITY:  [facility]  PHYSICIAN:  Grant Lara, M.D.   DATE OF BIRTH:  15-Feb-1941  DATE OF ADMISSION:  07/02/2010 DATE OF DISCHARGE:  07/05/2010                              DISCHARGE SUMMARY   DISCHARGE DIAGNOSES: 1. Transient ischemic attack with history of multiple strokes. 2. Patent foramen ovale, again on chronic Coumadin with a     subtherapeutic INR on this admission. 3. Hypotension with alteration in his medications. 4. Bradycardia with decrease in his Coreg. 5. History of deep vein thrombosis. 6. Congestive heart failure with an ejection fraction of 35%. 7. Hyperlipidemia with an LDL recently of around 120, this time it was     91.  I have started him on a statin. 8. Coronary artery disease. 9. History of tobacco abuse. 10.History of hypertension but now trouble with hypotension.  DISCHARGE MEDICATIONS: 1. Coreg 3.125 mg by mouth twice a day. 2. Aspirin 81 mg by mouth once a day until INR greater than 2. 3. Crestor or generic equivalent or alternative statin 10 mg by mouth     once a day. 4. Coumadin 6 mg by mouth on Mondays, Tuesdays, Thursdays, and Fridays     and Coumadin 7.5 mg on Wednesdays, Saturdays, and Sundays; to     follow up with the Coumadin Clinic on Monday. 5. Lisinopril 2.5 mg by mouth at night.  DISPOSITION AND FOLLOWUP:  Will be seen in the Coumadin Clinic on Monday March 26.  At that appointment please check a PR/INR noting the adjustments have been made with his Coumadin regimen.  When he came to the hospital his INR was subtherapeutic.  He was unsure how he was taking his medications.  His wife insists that he was taking them everyday so that will need to be rectified.  He is also going to follow up with the [facility] on Thursday March 29 at 1:45 as he says his hearing  is a bit worse since this admission.  He also needs to follow up with his primary care physician, Dr. Lucianne Lei and he has been instructed to call for an appointment in 1 week.  PROCEDURES PERFORMED DURING THIS HOSPITALIZATION:  An MRI of the brain on July 03, 2010 that showed no acute infarct.  He had a CT scan of the head on July 02, 2010 that showed stable appearance of remote bilateral cerebral and cerebellar infarcts.  BRIEF ADMISSION HISTORY AND PHYSICAL:  Grant Lara is a 70 year old male with a history as previously stated who presented to the hospital with confusion, difficulty speaking, and just acting confused while at the post office.  INITIAL LABORATORY DATA AND VITAL SIGNS:  Blood pressure 152/68, pulse 52, respiratory rate 17, temperature 97.8.  INR was 1.0, creatinine 1.52, glucose 124  For more detailed history and physical, please refer to admission dictation by Dr. Murlean Hark.  HOSPITAL COURSE BY PROBLEM: 1. Rule out TIA.  We did an MRI and CT, both were  negative.  The     problem was his INR was subtherapeutic.  Apparently, he had been at     the Coumadin Clinic recently.  They had increased his Coumadin     dosing and the question will remain, was he taking his medications.     I also note that on his prior discharge he was supposed to be on     Zocor, but I do not see a statin on this list, so I will write for     any generic equivalent that his pharmacy is able to give him and we     need to make sure that he is taking his medications appropriately.     Also, what may have been contributing to his presentation was the     fact that he was borderline hypotension and bradycardic, so may be     he was on a bit too much medicines.  These medicines are on board     for his congestive heart failure, so I have decreased his Coreg,     but we will try to keep it on board to 3.125 b.i.d. and we will     continue the lisinopril at 2.5 mg at night. 2. Subtherapeutic INR.   Question of medication noncompliance.  We did     put him back on Coumadin 6 mg.  His INR did gradually improve but     not as quickly as we would have liked, so I am giving a 7.5 mg dose     today.  I am aware that on 7.5 mg daily, he was supratherapeutic     and we will need to promote compliance, that is his primary issue.     Again, him and his wife are declining placement.  They are also     declining home visiting nurses, so we will continue with a regimen     of 6 mg 4 times a week and 7.5 mg 3 times a week and again he is     going to follow up with the Coumadin Clinic on Monday for further     adjustment and his Coumadin will need to be monitored closely given     his patent foramen ovale puts him at constant risk of stroke as     does the fact that he we know he has a clotting disorder as he has     had DVTs.  I did think it was a bit of risk to put him on low-dose     Lovenox injections going home, and again if he did not feel well     enough to go home today, so we will continue with the higher dose     of Coumadin.  I will also give him aspirin 81 mg for stroke     prevention at least until his INR is therapeutic. 3. Bradycardia:  As above, decrease his Coreg. 4. Hypotension:  As above, decrease his Coreg.  There was no acute stroke or TIA this admission but he was counseled on stroke prevention etc. per current stroke guidelines.  DISCHARGE LABORATORY DATA AND VITAL SIGNS:  Temperature 97.8, heart rate 47, respiratory rate 19, blood pressure 129/69, O2 sat 98% on room air. White count 5.6, hemoglobin 12, hematocrit 37, platelets 166, MCV 91, INR 1.37.  Sodium 138, potassium 4.1, chloride 108, bicarb 26, BUN 11, creatinine 1.27, glucose 282, calcium 8.1.  Length of this discharge was approximately 30 minutes.  Grant Lara, M.D.     JC/MEDQ  D:  07/05/2010  T:  07/05/2010  Job:  [id number]  cc:   Grant Lara, M.D. Grant Lei, M.D. Minus Breeding, MD,  [place]. Leonie Man, MD  Electronically Signed by Grant Lara M.D. on 07/27/2010 11:35:01 AM

## 2010-07-30 ENCOUNTER — Inpatient Hospital Stay: Payer: No Typology Code available for payment source | Admitting: Physical Medicine & Rehabilitation

## 2010-08-09 ENCOUNTER — Ambulatory Visit (INDEPENDENT_AMBULATORY_CARE_PROVIDER_SITE_OTHER): Payer: No Typology Code available for payment source | Admitting: *Deleted

## 2010-08-09 ENCOUNTER — Encounter: Payer: No Typology Code available for payment source | Admitting: *Deleted

## 2010-08-09 DIAGNOSIS — I2699 Other pulmonary embolism without acute cor pulmonale: Secondary | ICD-10-CM

## 2010-08-09 DIAGNOSIS — I6350 Cerebral infarction due to unspecified occlusion or stenosis of unspecified cerebral artery: Secondary | ICD-10-CM

## 2010-08-09 DIAGNOSIS — I82409 Acute embolism and thrombosis of unspecified deep veins of unspecified lower extremity: Secondary | ICD-10-CM

## 2010-08-09 DIAGNOSIS — I639 Cerebral infarction, unspecified: Secondary | ICD-10-CM

## 2010-08-09 LAB — POCT INR: INR: 3.6

## 2010-08-14 ENCOUNTER — Inpatient Hospital Stay: Payer: No Typology Code available for payment source | Admitting: Physical Medicine & Rehabilitation

## 2010-08-14 ENCOUNTER — Encounter: Payer: No Typology Code available for payment source | Attending: Physical Medicine & Rehabilitation

## 2010-08-23 ENCOUNTER — Telehealth: Payer: Self-pay

## 2010-08-23 ENCOUNTER — Ambulatory Visit (INDEPENDENT_AMBULATORY_CARE_PROVIDER_SITE_OTHER): Payer: No Typology Code available for payment source | Admitting: *Deleted

## 2010-08-23 DIAGNOSIS — I639 Cerebral infarction, unspecified: Secondary | ICD-10-CM

## 2010-08-23 DIAGNOSIS — I6350 Cerebral infarction due to unspecified occlusion or stenosis of unspecified cerebral artery: Secondary | ICD-10-CM

## 2010-08-23 DIAGNOSIS — I82409 Acute embolism and thrombosis of unspecified deep veins of unspecified lower extremity: Secondary | ICD-10-CM

## 2010-08-23 DIAGNOSIS — I2699 Other pulmonary embolism without acute cor pulmonale: Secondary | ICD-10-CM

## 2010-08-23 LAB — POCT INR: INR: 4.2

## 2010-08-23 NOTE — Telephone Encounter (Signed)
DISCHARGE MEDICATIONS: 1. Coreg 3.125 mg by mouth twice a day. 2. Aspirin 81 mg by mouth once a day until INR greater than 2. 3. Crestor or generic equivalent or alternative statin 10 mg by mouth     once a day. 4. Coumadin 6 mg by mouth on Mondays, Tuesdays, Thursdays, and Fridays     and Coumadin 7.5 mg on Wednesdays, Saturdays, and Sundays; to     follow up with the Coumadin Clinic on Monday. 5. Lisinopril 2.5 mg by mouth at night.  Pt needs refills on Coreg, Lisinopril and Crestor. Above is most recent hospitalization discharge medications from 07/05/10.

## 2010-08-24 MED ORDER — LISINOPRIL 5 MG PO TABS: 2.5000 mg | ORAL_TABLET | Freq: Every day | ORAL | Status: DC

## 2010-08-24 MED ORDER — ROSUVASTATIN CALCIUM 10 MG PO TABS: 10.0000 mg | ORAL_TABLET | Freq: Every day | ORAL | Status: DC

## 2010-08-24 MED ORDER — CARVEDILOL 3.125 MG PO TABS: 3.1250 mg | ORAL_TABLET | Freq: Two times a day (BID) | ORAL | Status: DC

## 2010-08-31 NOTE — H&P (Signed)
NAME:  Grant Lara, Grant Lara NO.:  [id number]   MEDICAL RECORD NO.:  [id number]                   PATIENT TYPE:  EMS   LOCATION:  ED                                   FACILITY:  [facility]   PHYSICIAN:  Larey Seat, M.D.               DATE OF BIRTH:  May 13, 1940   DATE OF ADMISSION:  10/11/2003  DATE OF DISCHARGE:                                HISTORY & PHYSICAL   HISTORY OF PRESENT ILLNESS:  Grant Lara is a 70 year old right-handed African-  American gentleman who noted at 8:15 the sudden onset of left arm weakness.  He states he had no control over his left upper extremity, and it just, as  he chose to call it, swang around his body.  He also noticed left leg  clumsiness, and an inability to walk straight.  He called his wife first,  who then urged him to come to the emergency room.  Apparently, the couple  drove together to [facility].  The patient here was evaluated by  the ER physician, and a CT of the head was obtained, which shows an older  right posterior MCA lesion, and some small vessel disease, but no acute  bleed, and no acute stroke, as far as I can detect.  The patient states that  he felt somewhat dizzy, and that his left leg was still dragging.   Laboratories were obtained.  White blood cell count was normal at 6.2,  hemoglobin and hematocrit at 14.3 and 42, platelet count 184.  Sodium 134,  potassium 4.2, chloride 105, glucose 98, BUN 18, creatinine 1.3.  Prothrombin time 11.8.  Partial prothrombin time 22 seconds.   The patient received oral aspirin upon check-in time at 9:30.  At that time,  it appeared to the triage nurse and the emergency room physician that he had  indeed left-sided grip strength, left-sided upper extremity weakness, and  lower extremity weakness, but no facial asymmetry.  The patient denies any  past medical history, except for a stroke that he believes was a mini-stroke  in 1995.  He is on no medications, no  aspirin, no antihypertensives, no  cholesterol medication at all.  He is not aware of having any diabetes  mellitus, irregular heart beat.  He has a family history of stroke, he  states.   PHYSICAL EXAMINATION:  MENTAL STATUS:  Alert and oriented x3.  The patient  repeats fluently.  He does have trouble remembering names, but his speech  shows no aphasia-type symptoms.  He is also not dysarthric, and he can  swallow fine.  CRANIAL NERVES:  Pupils are equal, round and reactive to light and  accommodation.  Full visual fields.  Bilateral simultaneous stimuli.  The  patient can count fingers in the perimetry.  He shows no sensory over the  face.  Tongue and uvula midline.  Intact gag.  Range of  motion normal.  No  carotid bruit.  MOTOR EXAM:  The patient has a 1/5 strength remaining in his left upper  extremity.  He is almost flaccid, and has no deep tendon reflex.  He cannot  grip.  The lower extremity can be lifted for about 4 seconds off the  mattress, then drifts.  I would call this a 3/5 strength.  He does not have  an upgoing toe at this time.  General deep tendon reflexes are all  attenuated.  SENSORY:  The patient states that fine touch, pinprick, and vibration are  diminished on the left arm and leg.  COORDINATION:  He cannot perform a finger-to-nose test due to weakness.  He  cannot perform a heel-to-shin test due to weakness.  He cannot walk for me.  He states that he is now worse off than when the time he came to the  emergency room.  VITAL SIGNS:  Blood pressure 117/68, heart rate of 55, which shows  occasional PCA, but no atrial fibrillation or flutter.   PLAN:  A cold stroke was called.  The patient has to be transported from  [facility] to [place] showed no acute bleed.  We will try to have the patient evaluated for IV heparin at [facility].  The patient has to have an ICU bed for the next 24 hours  available, however.  Otherwise,  the transfer  cannot occur.  I have voiced interest in placing the patient on a study  drug, and hope that we can do the patient information and consent part with  an arrival at the [facility].  The transport was ordered at 22  hours and 45 minutes.                                               Larey Seat, M.D.    CD/MEDQ  D:  10/11/2003  T:  10/11/2003  Job:  [id number]   cc:   Nelda Severe. Juventino Slovak, M.D.  [address], [place]  [place] [address]  Fax: [place]. Leonie Man, MD  Fax: [phone]

## 2010-08-31 NOTE — H&P (Signed)
NAME:  Grant Lara                  ACCOUNT NO.:  [id number]   MEDICAL RECORD NO.:  [id number]          PATIENT TYPE:  EMS   LOCATION:  MAJO                         FACILITY:  [facility]   PHYSICIAN:  Cherene Altes, M.D.DATE OF BIRTH:  [birth year]   DATE OF ADMISSION:  07/24/2005  DATE OF DISCHARGE:                                HISTORY & PHYSICAL   PRIMARY CARE PHYSICIAN:  Unassigned.   CHIEF COMPLAINT:  Chest pain.   HISTORY AND PHYSICAL:  [name] is a 70 year old gentleman with a  complex medical history.  He has not seen a primary doctor in over 8 months.  He has not taken any of his prescription medications in this same length of  time.  He reports that he had been doing fine until today.  He was at work  and experienced the acute onset of left-sided chest pain.  This was sharp  and caught his breath.  The patient paused for a moment, and the pain simply  worsened.  Because of the severity of his pain and the fact that it would  not resolve, he ultimately presented to [facility] emergency room for  evaluation.  In the emergency room, a CT scan of the chest was obtained  which revealed bilateral acute pulmonary emboli.  The patient does complain  of shortness of breath.  His chest pain has been poorly controlled.   REVIEW OF SYSTEMS:  Comprehensive review of systems is unremarkable, per the  patient, with the exception of what is noted in the history of present  illness above.   PAST MEDICAL HISTORY:  1.  Multiple CVAs.      1.  Right posterior MCA distribution CVA in 1995.      2.  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  2.  Atrioseptal aneurysm with patent foramen ovale noted on transesophageal      echocardiogram on previous Coumadin therapy.  3.  Hypertension.  4.  Prior history of tobacco abuse.  5.  Crohn's disease.  6.  Hyperhomocysteinemia.  7.  Hyperlipidemia.  8.  Coronary artery disease, status post PTCA and stent to the LAD.   MEDICATIONS:  The  patient has not been taking any medications for  approximately 8 months now.   ALLERGIES:  No known drug allergies.   FAMILY HISTORY:  Noncontributory secondary to age.   SOCIAL HISTORY:  The patient does not currently drink.  He is married.  He  has 5 children.  He lives in [place].   DATA REVIEWED:  Hemoglobin, white count, platelet count, MCV are normal.  Electrolytes are balanced and normal.  Serum glucose is elevated at 130.  A  12 lead EKG reveals normal sinus rhythm at 60 beats per minute with evidence  of left ventricular hypertrophy and a strain pattern.  D-dimer is elevated  at 1.31.  Point of care cardiac markers are negative x3.  INR is 1.1.  PTT  is 139, but it appears that this was drawn after a heparin bolus.  CT scan  of the chest reveals acute pulmonary  emboli with moderate clot burden  affecting the vascular supply to the lingula, the left lower lobe, the right  lower lobe, the anterior segment of the right upper lobe.  Note is also made  of air space opacity in the left lower lobe and lingula, questionably  representative of pulmonary hemorrhage.   PHYSICAL EXAMINATION:  VITAL SIGNS:  Temperature of 98.0, blood pressure  125/75, heart rate 68, respiratory rate 18, O2 saturation of 100% on  nonrebreather.  GENERAL:  Thin, relatively frail-appearing male who is splinting his breaths  and reporting pain with deep breaths.  HEENT:  Pupils are constricted bilaterally but are equal, consistent with  narcotic pain administration.  Extraocular muscles are intact.  OC/OP is  clear.  NECK:  There is no evidence of JVD to suggest right heart failure or strain.  LUNGS:  Decreased breath sounds in the bilateral bases, but clear to  auscultation otherwise without wheezes or rhonchi.  CARDIOVASCULAR:  Regular rate and rhythm with occasional ectopic beats  without gallop or rub.  ABDOMEN:  Nontender, nondistended, soft.  Bowel sounds present.  No  hepatosplenomegaly, no  rebound, no ascites.  EXTREMITIES:  Trace bilateral lower extremity edema to the knees.  NEUROLOGIC:  The patient is alert and oriented x4.  Cranial nerves II-XII  are intact bilaterally.  He displays 5/5 strength in the bilateral upper and  lower extremities.  He has intact sensation to touch throughout.   IMPRESSION AND PLAN:  1.  Severe bilateral pulmonary emboli.  The patient has had known history of      hyperhomocysteinemia.  He also has a history of patent foramen ovale.      He further has a history of atrioseptal aneurysm.  The patient      transiently complained of some night pain in the emergency room, but now      denies such.  It is not clear if he had DVTs or not.  The patient is, of      course, being treated with full dose IV heparin for his confirmed      pulmonary emboli.  He has multiple risk factors for repeat clots.  Given      the fact that he had a patent foramen ovale, he is lucky that he has not      suffered an acute CVA.  It is not clear if the patient's atrioseptal      aneurysm has played a role here, but I am suspicious of this.      Unfortunately, the patient decided to quit taking his Coumadin about 8      months ago.  I have counseled him that this was unadvised and may very      well have led to his acute illness.  I have advised him that his acute      illness is life threatening.  We will obtain bilateral lower extremity      Dopplers to evaluate for clots there.  After the patient has received      approximately 48 hours of heparin, we will begin Coumadin therapy and      assure there is at least a 48 hour overlap once the INR is therapeutic.  2.  Hyperlipidemia.  The patient has been off of his cholesterol-lowering      medication for at least 8 months now.  I will check a fasting lipid      panel in the morning, and if his lipids remain elevated, we  will resume      Lipitor. 3.  Coronary artery disease.  The patient's EKG is non-acute at this time.       Cardiac enzymes are negative on point of care markers x3.  We have a      very good explanation for his chest pain.  We do not feel that further      evaluation of the patient's coronaries is necessary at this time.  4.  Atrioseptal aneurysm with patent foramen ovale.  Certainly, these      defects, if nothing else, would lead to a significant increase in the      patient's risk for cerebrovascular accident.  I feel strongly that the      patient should be anticoagulated for the remainder of his life.  I have      discussed this with the patient at length.  We will resume Coumadin      according to the scheduled detailed above.  5.  Hyperhomocysteinemia.  The patient should be treated with Foltx.  He      previously was prescribed this medication, but chose to discontinue it      on his own.  I will recheck a fasting homocystine level in the morning.      I will empirically, however, initiate Foltx therapy here.  6.  Multiple cerebrovascular accidents.  The patient has a history of      multiple cerebrovascular accidents.  At present, he is neurologically      intact.  We will investigate his previous secondary stroke prophylaxis      regimen.  We will consider adding aspirin to his heparin/Coumadin      regimen if his hemoglobin remains stable.  7.  Crohn's disease.  Not an active issue are present.  8.  Elevated serum glucose.  The patient has no prior history of diabetes      mellitus.  Serum glucose at the time of evaluation in the emergency room      is 130.  This represents a significant elevation.  It is perceivable      that this is simply secondary to a stress reaction.  We will follow the      patient's serum CBG in house.  It if remains elevated, we will obtain a      hemoglobin A1C and fasting CBG to entertain the possibility of      undiagnosed diabetes.      Cherene Altes, M.D.  Electronically Signed     JTM/MEDQ  D:  07/24/2005  T:  07/25/2005  Job:   [id number]

## 2010-08-31 NOTE — Discharge Summary (Signed)
NAME:  Grant Lara, Grant Lara NO.:  [id number]   MEDICAL RECORD NO.:  [id number]                   PATIENT TYPE:  INP   LOCATION:  3040                                 FACILITY:  [facility]   PHYSICIAN:  Pramod P. Leonie Man, MD                 DATE OF BIRTH:  October 25, 1940   DATE OF ADMISSION:  10/12/2003  DATE OF DISCHARGE:  10/18/2003                                 DISCHARGE SUMMARY   DISCHARGE DIAGNOSES:  1. Multiple acute bi-hemispheric embolic infarcts presumed cardioembolic.  2. Patent foramen ovale with negative bubble study.  3. Atrial septal aneurysm.  4. Low ejection fraction of 35%-45% with unknown etiology.  5. Hyperhomocysteinemia.  6. Hyperlipidemia.  7. Smoker.  8. History of Crohn's disease.  9. History of stroke in 1995.   DISCHARGE MEDICATIONS:  1. Coumadin 7.5 mg for October 18, 2003 and October 19, 2003.  5 mg October 20, 2003,     then as directed.  2. Foltx 1 daily.  3. Lipitor 20 mg daily.   STUDIES PERFORMED:  1. CT of the head on admission showed subtle hyperdensity right occipital     lobe and left parietal subcortical region.  May represent old infarcts or     small vessel disease, although acute infarct cannot be excluded.  2. MRI of the brain shows multiple sites of infarcts of various ages     included subacute infarcts on the left occipital and left posterior     parietal territories.  Right posterior parietal occipital hyperdensity on     the CT is in part due to encephalomalacia compatible with remote right     PCA distribution infarct.  There is also subtle encephalomalacia changes     in the left frontal lobe.  DWI reveals multiple sites of acute subacute     bi-hemispheric infarcts significant for embolic disease.  3. MRA of the neck.  Images are not technically optimal but are     interpretable.  No significant lesion is detected.  There is some mild     focal stenosis in the proximal left ICA due to focal plaque involving the  posterior wall of the base of the carotid bulb.  4. MRA of the head shows abnormal left PCA with high grade stenosis.     Abnormal MCAs mainly near the trifurcations.  Stenosis left distal ICA.     Major intracranial branches appear patent.  5. EKG shows sinus rhythm with marked sinus arrhythmia.  Voltage criteria     for left ventricular hypertrophy.  ST elevation probably due to early     repolarization.  6. Transthoracic echocardiogram showed an ejection fraction of 35%-45% with     mild diffuse left ventricular hypokinesis.  There was an atrial septal     aneurysm.  Mild mitral regurgitation.  Aortic valve thickness mildly     increased.  7.  Transesophageal echocardiogram performed October 13, 2003 shows mild LVH     with moderate depressed systolic function with EF estimated 30%-35%.     Mildly thickened aortic valve with significant stenosis and mild rebuff.     There does appear to be several mobile echo densities noted on the     ventricular aspect of the valve, which possibly represent __________     excursions.  Doubt source of embolus.  Mild thickening mitral valve     leaflet.  No obvious intra-atrial mass thrombus noted.  Small PFO by     contrast with right to left shunt but no left to right shunt.  Mild     pulmonic __________.  No obvious source of stroke noted.  The patient     tolerated the procedure well.  8. Carotid Doppler is normal.   LABORATORY DATA:  INR on the day of discharge 1.8.  Hemoglobin 13.8,  hematocrit 39.7, white blood cells 6.3, and platelets 170.  TSH is 2.394.  Differential normal.  Chemistry normal except for total protein low at 5.6  and albumin low at 3.  Liver function tests within normal limits.  Protein  electrophoresis with low serum albumin 55.5.  Otherwise normal.  Hemoglobin  A1C normal.  Homocysteine elevated at 25.74.  Cardiac enzymes negative.  Cholesterol 158, triglycerides 40, HDL 47, LDL 103.  Serum drug screen is  negative.   HISTORY  OF PRESENT ILLNESS:  Grant Lara is a 70 year old right-handed  black male with no significant medical history other than a remote history  of Crohn's, who had increasingly difficult clumsiness in his left arm.  He  called to his wife at 8:15 the night of admission, noting symptoms  progressed at an hour or more, though it could have been up to 24 hour ago  before having her present him to the emergency room at [facility].  He was  not a TPA candidate secondary to time.  He is admitted for further stroke  workup.   HOSPITAL COURSE:  The patient was admitted.  He was placed on IV heparin for  secondary stroke prevention.  He did have multiple bi-cerebral infarcts  found to be secondary to probable cardioembolic etiology, though the  specific etiology is unclear.  He has a relative risk factor of atrial  septal aneurysm, patent foramen ovale without bi-chamber stunt, low EF 30%-  35% with unknown etiology, elevated homocysteine, and elevated LDL.  He is  also a smoker.  With multiple cardioembolic sources, the patient was placed  on IV heparin and transferred to Coumadin for home.  He was referred to  [facility].   For patent foramen ovale he was scheduled for an outpatient TCD with bubble  study with Dr. Leonie Man at discharge.  For low ejection fraction he has a  referral at [facility] and they will see him at  discharge to workup the etiology of the low ejection fraction.   The low ejection fraction is of an unknown etiology.   Hyperhomocysteinemia.  He was placed on Foltx for this.  He will need a  followup.  His LDL was 103.  He was placed on statin by [facility].  He will need a followup in 6 weeks.   He is a smoker and he was advised to quit.   His left-sided weakness improved during his hospitalization.  The initial  plan was to send him to rehabilitation but he is able to walk 200  feet without assistance and therefore was eligible for  discharge home.  We will  continue PT and OT as an outpatient.  He will follow up Coumadin with Dr.  Delilah Shan, his primary care physician.   CONDITION ON DISCHARGE:  The patient is alert and oriented x3.  His speech  is clear.  No aphasia.  He has some left lower facial weakness but is much  improved.  He has a mild left upper extremity drift with a left weak grip.  Otherwise deltoid is 4+/5 and in left lower extremity 4/5.  His gait is  steady and he is able to walk 200 feet.  Right strength is normal.   DISCHARGE PLAN:  1. Discharge home.  2. Lovenox injection given prior discharge.  He will take 7.5 Coumadin on     Tuesday and Wednesday with INR check at Dr. Scarlette Ar on Thursday.  If     INR is unknown, he will take 5 mg Coumadin Thursday and then adjust on     Friday when he sees Dr. Delilah Shan at 2:45.  3. Outpatient OT and PT.  4. Follow up with Dr. Leonie Man 2-3 months.  5. Follow up with [facility] in 2-3 weeks.  6. Outpatient TCD with Dr. Leonie Man.  7. No work.  8. No driving.  9. No smoking.      Burnetta Sabin, N.P.                         Pramod P. Leonie Man, MD    SB/MEDQ  D:  10/18/2003  T:  10/18/2003  Job:  [id number]

## 2010-08-31 NOTE — Op Note (Signed)
[facility]  Patient:    Grant Lara                            MRN: [id number] Proc. Date: 01/22/99 Adm. Date:  [id number] Attending:  Sheran Luz CC:         Wynonia Sours, M.D. (2 copies)             Nelda Severe. Juventino Slovak, M.D.                           Operative Report  PREOPERATIVE DIAGNOSIS:  Infection, left little finger metacarpophalangeal joint.  POSTOPERATIVE DIAGNOSIS:  Infection, left little finger metacarpophalangeal joint; infected epidermal inclusion cyst, left little finger.  OPERATION:  Excision epidermal inclusion cyst, drainage of infection, left little finger.  SURGEON:  Wynonia Sours, M.D.  ASSISTANT:  Odessa Fleming, R.N.  ANESTHESIA:  Sherryle Lis IV regional  ANESTHESIOLOGIST:  Finis Bud, M.D.  HISTORY:  The patient is a 70-year-old male with a history of swelling and pain,  left little finger.   He was referred by Dr. Juventino Slovak.  DESCRIPTION OF PROCEDURE:  The patient was brought to the operating room where  forearm-based IV regional anesthetic was carried out without difficulty.  He was prepped and draped using Betadine scrub and solution with the left arm free. An volar Brunner type incision was made, carried down through subcutaneous tissues. Bleeders were electrocauterized.  Neurovascular structure was identified.  An epidermal cyst was present and found to be communicating with the open area in he skin.  Cultures were taken.  This was excised in toto.  Dissection was carried own to the flexor sheath which was not violated.  There was no fluid within the sheath. The wound was copiously irrigated with bacitracin containing saline solution and packed open.  A sterile compressive dressing and splint was applied.  The patient tolerated the procedure well and was taken to the recovery room for observation in satisfactory condition.  He is discharged home to return to the [place] in one  week on Vicodin and Keflex. DD:  01/22/99 TD:  01/22/99 Job: [id number]

## 2010-08-31 NOTE — Cardiovascular Report (Signed)
NAME:  ZUHAIR, LARICCIA                  ACCOUNT NO.:  [id number]   MEDICAL RECORD NO.:  [id number]          PATIENT TYPE:  OIB   LOCATION:  6531                         FACILITY:  [facility]   PHYSICIAN:  Eden Lathe. Einar Gip, M.D.     DATE OF BIRTH:  1940-11-30   DATE OF PROCEDURE:  01/12/2004  DATE OF DISCHARGE:                              CARDIAC CATHETERIZATION   PROCEDURE PERFORMED:  1.  Percutaneous transluminal coronary angioplasty and stenting of the left      anterior descending artery.  2.  Balloon angioplasty of the stent jailed diagonal one.  3.  Intracoronary nitroglycerin administration.  4.  Use of Angiomax for adjuvant anticoagulation.   INDICATION:  Mr. Chet Greenley is a 70 year old African-American male with  history of coronary artery disease by diagnostic cardiac catheterization on  December 27, 2003 which showed a high grade mid LAD stenosis.  He has  markedly reduced ejection fraction of 35% with anterolateral wall  hypokinesis and generalized hypokinesis.  Because of his abnormal wall  motion and also a high grade lesion in this LAD, he was brought to the  cardiac catheterization lab on elective basis for angioplasty to his LAD.   HEMODYNAMIC DATA:  The aortic pressure was 130/69 with a mean of 95 mmHg.   Please note for complete angiographic data of his diagnostic cardiac  catheterization, please note the dictation on December 27, 2003 done at  [facility].   Briefly, the left main has distal 20-305% smooth stenosis and the circumflex  is small and has mild noncritical coronary disease.  Ramus intermediate is a  large vessel.  It has mild noncritical coronary disease.   The LAD was a large vessel with a large diagonal one and just after the  bifurcation of the diagonal one there is an 85% stenosis.  The LAD also has  mild luminal irregularity prior to this high grade stenosis and also the  diagonal one has ostial 20-30% stenosis.  The distal LAD has mild  disease.   INTERVENTIONAL DATA:  Successful percutaneous transluminal coronary  angioplasty and direct stenting of the left anterior descending artery with  3.0 x 20-mm Taxus stent deployed at 19 atmospheric pressure (3.5 mm lumen).  The stenosis was reduced from 85% to 0% with TIMI-3 to  TIMI-3 flow maintained at the end of the procedure.  There was stent jailing  of the moderate size diagonal one.   Successful balloon angioplasty of the stent jailed diagonal one with a 2.0 x  15-mm Maverick at 9 atmospheric pressure.  The stenosis was reduced from 90%  to 0% with TIMI-3 to TIMI-3 flow maintained at the end of the procedure.   RECOMMENDATIONS:  The patient will be continued on aspirin and Plavix  indefinitely given his history of stroke and also history of coronary artery  disease.  Again, continued risk factor modification is indicated.  Please  note, the TIMI flow was from TIMI-2 to TIMI-3 flow in the diagonal two.   Continued risk factor modification is again indicated.   TECHNIQUE OF PROCEDURE:  Under usual  sterile precautions, using a 7 French  right femoral arterial access, a 7 FL-3.5 guide was advanced into the  ascending aorta over a 0.035-inch J wire.  The catheter was manipulated and  engaged in the left main coronary artery and a 190 cm x 0.014 inch Forte  guide wire was utilized to cross into the left anterior descending artery  and the lesion length was carefully measured.  Then, a 3.0 x 20-mm Taxus  stent was advanced over this guide wire and the stent was attempted to  carefully position at the ostium of the diagonal one.  Because of  significant shift of the stent during diastole and systole, making the best  effort, the stent was deployed hopefully in the diastolic phase of the  cardiac cycle and angiography was repeated.  The stent had covered the  diagonal vessel and had jailed the diagonal one and there is TIMI-1 to 2  flow noted in the diagonal with ostial 90%  stenosis of the diagonal one.  The LAD itself looked excellent with TIMI-3 flow.  Then, the same guide wire  was withdrawn and it was readvanced into the diagonal one ostium.  Then, a  2.0 x 15-mm Maverick balloon was advanced over this guide wire and after  carefully positioning the marker of the balloon to just at the stent strut,  a balloon inflation initially at 6 atmospheric pressure, then at 9  atmospheric pressure for 30 seconds was performed.  200 mcg of intracoronary  nitroglycerin was administered and angiography was repeated.  Excellent  results were noted in the diagonal branch with TIMI-3 flow.  After  confirming the success, the guide wire in the balloon was withdrawn and  pulled out of the body in the usual fashion.  The patient tolerated the  procedure well.  No immediate complications were noted.  During the  procedure, intravenous Angiomax was administered and ACT was maintained at  therapeutic range.       JRG/MEDQ  D:  01/12/2004  T:  01/12/2004  Job:  [id number]   cc:   Pramod P. Leonie Man, MD  Fax: [phone]

## 2010-08-31 NOTE — Discharge Summary (Signed)
NAME:  Grant Lara, Grant Lara                  ACCOUNT NO.:  [id number]   MEDICAL RECORD NO.:  [id number]          PATIENT TYPE:  INP   LOCATION:  7414                         FACILITY:  [facility]   PHYSICIAN:  Mobolaji B. Bakare, M.D.DATE OF BIRTH:  1940-07-25   DATE OF ADMISSION:  07/24/2005  DATE OF DISCHARGE:  08/01/2005                                 DISCHARGE SUMMARY   PRIMARY CARE PHYSICIAN:  Patient was unassigned.   CONSULTS:  None.   FINAL DIAGNOSES:  1.  Bilateral pulmonary embolism.  2.  Left lower extremity deep venous thrombosis.  3.  Hyperhomocysteinemia.  4.  Diabetes mellitus.  5.  Dyslipidemia.  6.  Ischemic cardiomyopathy.  7.  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  8.  Hypertension.   PROCEDURES:  1.  Chest x-ray done on the 11th of April showed left lower lobe air space      disease, most likely __________ infection.  2.  CT angiogram of the chest done on the 11th of April showed bilateral      pulmonary embolus, acute moderate clot body, mild consolidation in the      posterior basal segment of left lower lobe and lingula potentially      representing pulmonary hemorrhage, small left pleural effusion, left      renal cyst.  3.  Follow-up chest x-ray:  Left pleural effusion and associated lower lobe      air space disease, stable small right pleural effusion felt to be      secondary to pulmonary infection.  4.  Lower extremity Dopplers which showed acute deep venous thrombosis, non-      occlusive, in the left lower extremity.  5.  2-D echocardiogram done on the 13th of April showed left ventricular      ejection fraction of 25% with inferior septal akinesis, left ventricular      size was normal.  Features consistent with moderate diastolic      dysfunction.  The pulmonary artery systolic pressure was 24.  Right      ventricular function was normal.   BRIEF HISTORY:  Grant Lara is a 70 year old  African-American male with past  medical history significant for multiple CVAs, intraseptal aneurysm with  patent foramen ovale, hypertension, coronary artery disease status post  PTCA.  Please refer to the full H&P for details.  70 years presented to the  emergency room with chest pain.  On further evaluation he had a CT scan of  the chest which confirmed pulmonary embolism.  He was therefore admitted for  anticoagulation and further evaluation.  Grant Lara supposed to have been on  chronic Coumadin therapy.  However, he quit using Coumadin and he has not  seen a physician in the preceding eight months.   HOSPITAL COURSE:  #1 - BILATERAL PULMONARY EMBOLISM:  Grant Lara was started  on anticoagulation with heparin infusion.  He received heparin for  approximately 36 hours and Coumadin was initiated.  He was continued on  Coumadin and heparin with an  overlap for 48 hours.  There was no evidence of  bleeding during the anticoagulation therapy in the hospital.  Hemoglobin and  hematocrit remained stable.   He had a lower extremity Doppler which confirmed left lower extremity DVT.  In addition, he had a 2-D echocardiogram which did not show any right  ventricular strain.  The right ventricle systolic function was normal.  It  was not felt as an indication for Greenfield filter at this point.   Follow-up chest x-ray did show pleural effusion which was thought to be  secondary to pulmonary infection.  It did not show any sign of infection.  There was no fever or leukocytosis.   #2 - DIABETES MELLITUS:  Patient was noted to have hyperglycemia and he was  evaluated with fasting blood sugar.  He was confirmed to have diabetes  mellitus.  His hemoglobin A1c was 6.3.  Patient was started on Glipizide 7.5  mg daily.  He was instructed to check finger stick blood sugar pre breakfast  and at bedtime and to show these to his primary care physician.   #3 - DYSLIPIDEMIA:  Evaluation with fasting lipid profile  revealed LDL of  108 and HDL of 44.  Given patient's history of CAD and diabetes mellitus  Zocor was initiated and he will be discharged home on 20 mg of Zocor.   #4 - HYPERTENSION:  This was controlled during this hospitalization.   #5 - ISCHEMIC CARDIOMYOPATHY/PATENT FORAMEN OVALE WITH INTRASEPTAL ANEURYSM:  Patient is supposed to be on chronic anticoagulation and supposed to follow  up with his cardiologist, Dr. Einar Gip.  However, he has not been compliant  with follow-up.  Again, he was advised to follow up with his cardiologist on  discharge.   DISCHARGE MEDICATIONS:  1.  Coumadin 5 mg one in the evening.  2.  Glipizide 7.5 mg daily.  3.  Toprol XL 25 mg daily.  4.  Zocor 20 mg daily.  5.  Foltx one daily.   Follow up with Dr. Einar Gip in four to six weeks.  Follow up with primary care  doctor to be arranged next week.  Recommendation to have PT/INR checked on  Monday, August 05, 2005.  Follow-up chest x-ray in two to four weeks.   DISCHARGE LABORATORY DATA:  White cells 9.6, hemoglobin 13.5, hematocrit  40.6, platelets 334.  PT/INR 27/2.5.  Sodium 129, potassium 3.6, chloride  102, bicarbonate 27, glucose 66, BUN 11, creatinine 1.3, calcium 8.3.  Other  laboratory data:  Homocysteine level 18.6, alkaline phosphatase 90, AST 14,  ALT 15.  Hemoglobin A1c 6.3.  Total cholesterol 167, triglyceride 75, HDL  44, LDL 108.  D-dimer 1.31.      Mobolaji B. Maia Petties, M.D.  Electronically Signed     MBB/MEDQ  D:  08/01/2005  T:  08/01/2005  Job:  [id number]   cc:   Eden Lathe. Einar Gip, MD  Fax: [phone]

## 2010-08-31 NOTE — Cardiovascular Report (Signed)
NAME:  Grant Lara, Grant Lara NO.:  [id number]   MEDICAL RECORD NO.:  [id number]                   PATIENT TYPE:  INP   LOCATION:  2923                                 FACILITY:  [facility]   PHYSICIAN:  Octavia Heir, M.D.             DATE OF BIRTH:  [birth year]   DATE OF PROCEDURE:  10/13/2003  DATE OF DISCHARGE:                              CARDIAC CATHETERIZATION   PROCEDURE:  Transesophageal echocardiogram.   CARDIOLOGIST:  Octavia Heir, M.D.   COMPLICATIONS:  None.   INDICATIONS:  [name] is a 70 year old male patient of Dr. Brett Fairy with a  history of Crohn's disease who was admitted on 10/12/03 with acute left-  sided weakness.  The patient is now referred for transesophageal  echocardiogram to rule out intracardiac source of possible embolus.   DESCRIPTION OF PROCEDURE:  After obtaining informed written consent, the  patient was then given 50 mg of Demerol and 1 mg of Versed.  He then  underwent successful and uncomplicated transesophageal echocardiogram.   FINDINGS:  1. There was noted to be mild left ventricular hypertrophy with moderately     depressed LV systolic function with ejection fraction of approximately 30-     35%.  There appears to be global hypokinesis.  2. There is mildly thickened aortic valve leaflets with mild aortic     regurgitation.  There does appear to be a few mobile echogenicities     consistent with _________.  3. Mildly thickened mitral valve leaflets with mild mitral regurgitation.  4. The tricuspid valve has trivial tricuspid regurgitation.  5. There was mild pulmonic regurgitation.  6. There is no evidence of intracardiac mass or thrombus present.  7. There does appear to be a small PFO noted by contrast echo with minimal     right to left but not left to right shunting.  8. Normal ascending thoracic aorta.   CONCLUSION:  Successful transesophageal echocardiogram without evidence of  obvious source of  thrombus.                                               Octavia Heir, M.D.    RHM/MEDQ  D:  10/13/2003  T:  10/13/2003  Job:  [id number]

## 2010-09-03 ENCOUNTER — Encounter: Payer: No Typology Code available for payment source | Admitting: *Deleted

## 2010-09-05 ENCOUNTER — Telehealth: Payer: Self-pay | Admitting: Cardiology

## 2010-09-05 ENCOUNTER — Encounter (INDEPENDENT_AMBULATORY_CARE_PROVIDER_SITE_OTHER): Payer: No Typology Code available for payment source | Admitting: *Deleted

## 2010-09-05 DIAGNOSIS — I6350 Cerebral infarction due to unspecified occlusion or stenosis of unspecified cerebral artery: Secondary | ICD-10-CM

## 2010-09-05 DIAGNOSIS — I639 Cerebral infarction, unspecified: Secondary | ICD-10-CM

## 2010-09-05 DIAGNOSIS — I82409 Acute embolism and thrombosis of unspecified deep veins of unspecified lower extremity: Secondary | ICD-10-CM

## 2010-09-05 DIAGNOSIS — I2699 Other pulmonary embolism without acute cor pulmonale: Secondary | ICD-10-CM

## 2010-09-05 MED ORDER — WARFARIN SODIUM 5 MG PO TABS: 5.0000 mg | ORAL_TABLET | ORAL | Status: DC

## 2010-09-05 NOTE — Telephone Encounter (Signed)
Please call coumadin into [facility] at [phone].  This is the 5 mg tablet.

## 2010-09-14 ENCOUNTER — Inpatient Hospital Stay: Payer: No Typology Code available for payment source | Admitting: Physical Medicine & Rehabilitation

## 2010-09-14 ENCOUNTER — Encounter: Payer: No Typology Code available for payment source | Attending: Physical Medicine & Rehabilitation

## 2010-09-20 ENCOUNTER — Ambulatory Visit (INDEPENDENT_AMBULATORY_CARE_PROVIDER_SITE_OTHER): Payer: No Typology Code available for payment source | Admitting: *Deleted

## 2010-09-20 DIAGNOSIS — I639 Cerebral infarction, unspecified: Secondary | ICD-10-CM

## 2010-09-20 DIAGNOSIS — I6350 Cerebral infarction due to unspecified occlusion or stenosis of unspecified cerebral artery: Secondary | ICD-10-CM

## 2010-09-20 DIAGNOSIS — I82409 Acute embolism and thrombosis of unspecified deep veins of unspecified lower extremity: Secondary | ICD-10-CM

## 2010-09-20 DIAGNOSIS — I2699 Other pulmonary embolism without acute cor pulmonale: Secondary | ICD-10-CM

## 2010-09-20 LAB — POCT INR: INR: 3.3

## 2010-09-22 ENCOUNTER — Emergency Department (HOSPITAL_COMMUNITY)
Admission: EM | Admit: 2010-09-22 | Discharge: 2010-09-22 | Disposition: A | Discharge status: Home / Self Care | Payer: No Typology Code available for payment source | Attending: Emergency Medicine | Admitting: Emergency Medicine

## 2010-09-22 DIAGNOSIS — IMO0001 Reserved for inherently not codable concepts without codable children: Secondary | ICD-10-CM | POA: Insufficient documentation

## 2010-09-22 DIAGNOSIS — L299 Pruritus, unspecified: Secondary | ICD-10-CM | POA: Insufficient documentation

## 2010-09-22 DIAGNOSIS — I824Z9 Acute embolism and thrombosis of unspecified deep veins of unspecified distal lower extremity: Secondary | ICD-10-CM | POA: Insufficient documentation

## 2010-09-22 DIAGNOSIS — M79609 Pain in unspecified limb: Secondary | ICD-10-CM | POA: Insufficient documentation

## 2010-09-22 DIAGNOSIS — R21 Rash and other nonspecific skin eruption: Secondary | ICD-10-CM | POA: Insufficient documentation

## 2010-09-22 DIAGNOSIS — Z79899 Other long term (current) drug therapy: Secondary | ICD-10-CM | POA: Insufficient documentation

## 2010-09-22 DIAGNOSIS — I10 Essential (primary) hypertension: Secondary | ICD-10-CM | POA: Insufficient documentation

## 2010-09-22 DIAGNOSIS — E119 Type 2 diabetes mellitus without complications: Secondary | ICD-10-CM | POA: Insufficient documentation

## 2010-09-22 DIAGNOSIS — Z9889 Other specified postprocedural states: Secondary | ICD-10-CM | POA: Insufficient documentation

## 2010-09-22 DIAGNOSIS — M7989 Other specified soft tissue disorders: Secondary | ICD-10-CM | POA: Insufficient documentation

## 2010-09-22 DIAGNOSIS — K5090 Crohn's disease, unspecified, without complications: Secondary | ICD-10-CM | POA: Insufficient documentation

## 2010-09-22 LAB — POCT I-STAT, CHEM 8
BUN: 16 mg/dL (ref 6–23)
Calcium, Ion: 1.11 mmol/L — ABNORMAL LOW (ref 1.12–1.32)
Hemoglobin: 14.3 g/dL (ref 13.0–17.0)
Sodium: 138 mEq/L (ref 135–145)
TCO2: 27 mmol/L (ref 0–100)

## 2010-09-22 LAB — PROTIME-INR
INR: 3.15 — ABNORMAL HIGH (ref 0.00–1.49)
Prothrombin Time: 32.4 seconds — ABNORMAL HIGH (ref 11.6–15.2)

## 2010-09-25 ENCOUNTER — Ambulatory Visit: Payer: No Typology Code available for payment source | Admitting: Cardiology

## 2010-09-28 ENCOUNTER — Encounter: Payer: Self-pay | Admitting: Cardiology

## 2010-10-11 ENCOUNTER — Ambulatory Visit (INDEPENDENT_AMBULATORY_CARE_PROVIDER_SITE_OTHER): Payer: No Typology Code available for payment source | Admitting: *Deleted

## 2010-10-11 DIAGNOSIS — I82409 Acute embolism and thrombosis of unspecified deep veins of unspecified lower extremity: Secondary | ICD-10-CM

## 2010-10-11 DIAGNOSIS — I6350 Cerebral infarction due to unspecified occlusion or stenosis of unspecified cerebral artery: Secondary | ICD-10-CM

## 2010-10-11 DIAGNOSIS — I2699 Other pulmonary embolism without acute cor pulmonale: Secondary | ICD-10-CM

## 2010-10-11 DIAGNOSIS — I639 Cerebral infarction, unspecified: Secondary | ICD-10-CM

## 2010-10-24 ENCOUNTER — Ambulatory Visit (INDEPENDENT_AMBULATORY_CARE_PROVIDER_SITE_OTHER): Payer: No Typology Code available for payment source | Admitting: *Deleted

## 2010-10-24 DIAGNOSIS — I2699 Other pulmonary embolism without acute cor pulmonale: Secondary | ICD-10-CM

## 2010-10-24 DIAGNOSIS — I82409 Acute embolism and thrombosis of unspecified deep veins of unspecified lower extremity: Secondary | ICD-10-CM

## 2010-10-24 DIAGNOSIS — I639 Cerebral infarction, unspecified: Secondary | ICD-10-CM

## 2010-10-24 DIAGNOSIS — I6350 Cerebral infarction due to unspecified occlusion or stenosis of unspecified cerebral artery: Secondary | ICD-10-CM

## 2010-10-24 LAB — POCT INR: INR: 3.5

## 2010-10-25 ENCOUNTER — Encounter: Payer: No Typology Code available for payment source | Admitting: *Deleted

## 2010-11-06 ENCOUNTER — Ambulatory Visit (INDEPENDENT_AMBULATORY_CARE_PROVIDER_SITE_OTHER): Payer: No Typology Code available for payment source | Admitting: *Deleted

## 2010-11-06 DIAGNOSIS — I2699 Other pulmonary embolism without acute cor pulmonale: Secondary | ICD-10-CM

## 2010-11-06 DIAGNOSIS — I639 Cerebral infarction, unspecified: Secondary | ICD-10-CM

## 2010-11-06 DIAGNOSIS — I82409 Acute embolism and thrombosis of unspecified deep veins of unspecified lower extremity: Secondary | ICD-10-CM

## 2010-11-06 DIAGNOSIS — I6350 Cerebral infarction due to unspecified occlusion or stenosis of unspecified cerebral artery: Secondary | ICD-10-CM

## 2010-11-27 ENCOUNTER — Ambulatory Visit (INDEPENDENT_AMBULATORY_CARE_PROVIDER_SITE_OTHER): Payer: No Typology Code available for payment source | Admitting: *Deleted

## 2010-11-27 DIAGNOSIS — I6350 Cerebral infarction due to unspecified occlusion or stenosis of unspecified cerebral artery: Secondary | ICD-10-CM

## 2010-11-27 DIAGNOSIS — I2699 Other pulmonary embolism without acute cor pulmonale: Secondary | ICD-10-CM

## 2010-11-27 DIAGNOSIS — I639 Cerebral infarction, unspecified: Secondary | ICD-10-CM

## 2010-11-27 DIAGNOSIS — I82409 Acute embolism and thrombosis of unspecified deep veins of unspecified lower extremity: Secondary | ICD-10-CM

## 2010-12-11 ENCOUNTER — Ambulatory Visit (INDEPENDENT_AMBULATORY_CARE_PROVIDER_SITE_OTHER): Payer: No Typology Code available for payment source | Admitting: *Deleted

## 2010-12-11 DIAGNOSIS — I82409 Acute embolism and thrombosis of unspecified deep veins of unspecified lower extremity: Secondary | ICD-10-CM

## 2010-12-11 DIAGNOSIS — I639 Cerebral infarction, unspecified: Secondary | ICD-10-CM

## 2010-12-11 DIAGNOSIS — I6350 Cerebral infarction due to unspecified occlusion or stenosis of unspecified cerebral artery: Secondary | ICD-10-CM

## 2010-12-11 DIAGNOSIS — I2699 Other pulmonary embolism without acute cor pulmonale: Secondary | ICD-10-CM

## 2010-12-11 LAB — POCT INR: INR: 2.6

## 2011-01-03 ENCOUNTER — Ambulatory Visit (INDEPENDENT_AMBULATORY_CARE_PROVIDER_SITE_OTHER): Payer: No Typology Code available for payment source | Admitting: *Deleted

## 2011-01-03 DIAGNOSIS — I6350 Cerebral infarction due to unspecified occlusion or stenosis of unspecified cerebral artery: Secondary | ICD-10-CM

## 2011-01-03 DIAGNOSIS — I2699 Other pulmonary embolism without acute cor pulmonale: Secondary | ICD-10-CM

## 2011-01-03 DIAGNOSIS — I639 Cerebral infarction, unspecified: Secondary | ICD-10-CM

## 2011-01-03 DIAGNOSIS — I82409 Acute embolism and thrombosis of unspecified deep veins of unspecified lower extremity: Secondary | ICD-10-CM

## 2011-01-03 LAB — POCT INR: INR: 2.6

## 2011-01-23 ENCOUNTER — Emergency Department (HOSPITAL_COMMUNITY): Payer: No Typology Code available for payment source

## 2011-01-23 ENCOUNTER — Inpatient Hospital Stay (HOSPITAL_COMMUNITY)
Admission: EM | Admit: 2011-01-23 | Discharge: 2011-02-02 | DRG: 388 | Disposition: A | Discharge status: Home / Self Care | Payer: No Typology Code available for payment source | Source: Ambulatory Visit | Attending: Internal Medicine | Admitting: Internal Medicine

## 2011-01-23 DIAGNOSIS — J4489 Other specified chronic obstructive pulmonary disease: Secondary | ICD-10-CM | POA: Diagnosis present

## 2011-01-23 DIAGNOSIS — Z79899 Other long term (current) drug therapy: Secondary | ICD-10-CM

## 2011-01-23 DIAGNOSIS — Z7901 Long term (current) use of anticoagulants: Secondary | ICD-10-CM

## 2011-01-23 DIAGNOSIS — D72829 Elevated white blood cell count, unspecified: Secondary | ICD-10-CM | POA: Diagnosis not present

## 2011-01-23 DIAGNOSIS — I2510 Atherosclerotic heart disease of native coronary artery without angina pectoris: Secondary | ICD-10-CM | POA: Diagnosis present

## 2011-01-23 DIAGNOSIS — Z23 Encounter for immunization: Secondary | ICD-10-CM

## 2011-01-23 DIAGNOSIS — Q211 Atrial septal defect: Secondary | ICD-10-CM

## 2011-01-23 DIAGNOSIS — Z8673 Personal history of transient ischemic attack (TIA), and cerebral infarction without residual deficits: Secondary | ICD-10-CM

## 2011-01-23 DIAGNOSIS — I5022 Chronic systolic (congestive) heart failure: Secondary | ICD-10-CM | POA: Diagnosis present

## 2011-01-23 DIAGNOSIS — E785 Hyperlipidemia, unspecified: Secondary | ICD-10-CM | POA: Diagnosis present

## 2011-01-23 DIAGNOSIS — K56609 Unspecified intestinal obstruction, unspecified as to partial versus complete obstruction: Principal | ICD-10-CM | POA: Diagnosis present

## 2011-01-23 DIAGNOSIS — Q2111 Secundum atrial septal defect: Secondary | ICD-10-CM

## 2011-01-23 DIAGNOSIS — Z87891 Personal history of nicotine dependence: Secondary | ICD-10-CM

## 2011-01-23 DIAGNOSIS — I509 Heart failure, unspecified: Secondary | ICD-10-CM | POA: Diagnosis present

## 2011-01-23 DIAGNOSIS — I10 Essential (primary) hypertension: Secondary | ICD-10-CM | POA: Diagnosis present

## 2011-01-23 DIAGNOSIS — I428 Other cardiomyopathies: Secondary | ICD-10-CM | POA: Diagnosis present

## 2011-01-23 DIAGNOSIS — J449 Chronic obstructive pulmonary disease, unspecified: Secondary | ICD-10-CM | POA: Diagnosis present

## 2011-01-23 DIAGNOSIS — T380X5A Adverse effect of glucocorticoids and synthetic analogues, initial encounter: Secondary | ICD-10-CM | POA: Diagnosis not present

## 2011-01-23 DIAGNOSIS — J189 Pneumonia, unspecified organism: Secondary | ICD-10-CM | POA: Diagnosis present

## 2011-01-23 LAB — HEPATIC FUNCTION PANEL
Alkaline Phosphatase: 95 U/L (ref 39–117)
Bilirubin, Direct: <0.1 mg/dL (ref 0.0–0.3)
Total Bilirubin: 0.4 mg/dL (ref 0.3–1.2)

## 2011-01-23 LAB — CBC
MCHC: 33.7 g/dL (ref 30.0–36.0)
Platelets: 210 10*3/uL (ref 150–400)
RDW: 13.4 % (ref 11.5–15.5)

## 2011-01-23 LAB — CK TOTAL AND CKMB (NOT AT ARMC)
CK, MB: 3.3 ng/mL (ref 0.3–4.0)
Total CK: 139 U/L (ref 7–232)

## 2011-01-23 LAB — DIFFERENTIAL
Basophils Absolute: 0.1 10*3/uL (ref 0.0–0.1)
Basophils Relative: 1 % (ref 0–1)
Eosinophils Absolute: 0.2 10*3/uL (ref 0.0–0.7)
Eosinophils Relative: 2 % (ref 0–5)
Monocytes Absolute: 0.6 10*3/uL (ref 0.1–1.0)
Neutro Abs: 3 10*3/uL (ref 1.7–7.7)

## 2011-01-23 LAB — POCT I-STAT TROPONIN I: Troponin i, poc: 0.02 ng/mL (ref 0.00–0.08)

## 2011-01-23 LAB — POCT I-STAT, CHEM 8
Calcium, Ion: 1.26 mmol/L (ref 1.12–1.32)
Chloride: 106 mEq/L (ref 96–112)
HCT: 44 % (ref 39.0–52.0)
Hemoglobin: 15 g/dL (ref 13.0–17.0)
TCO2: 25 mmol/L (ref 0–100)

## 2011-01-23 LAB — LIPASE, BLOOD: Lipase: 54 U/L (ref 11–59)

## 2011-01-24 ENCOUNTER — Encounter (HOSPITAL_COMMUNITY): Payer: Self-pay | Admitting: Radiology

## 2011-01-24 ENCOUNTER — Emergency Department (HOSPITAL_COMMUNITY): Payer: No Typology Code available for payment source

## 2011-01-24 DIAGNOSIS — R109 Unspecified abdominal pain: Secondary | ICD-10-CM

## 2011-01-24 LAB — URINE MICROSCOPIC-ADD ON

## 2011-01-24 LAB — PROTIME-INR: Prothrombin Time: 21.3 seconds — ABNORMAL HIGH (ref 11.6–15.2)

## 2011-01-24 LAB — URINALYSIS, ROUTINE W REFLEX MICROSCOPIC
Bilirubin Urine: NEGATIVE
Glucose, UA: 100 mg/dL — AB
Hgb urine dipstick: NEGATIVE
Protein, ur: 30 mg/dL — AB
Urobilinogen, UA: 0.2 mg/dL (ref 0.0–1.0)

## 2011-01-24 LAB — GLUCOSE, CAPILLARY: Glucose-Capillary: 100 mg/dL — ABNORMAL HIGH (ref 70–99)

## 2011-01-25 ENCOUNTER — Inpatient Hospital Stay (HOSPITAL_COMMUNITY): Payer: No Typology Code available for payment source

## 2011-01-25 LAB — CBC
HCT: 37.8 % — ABNORMAL LOW (ref 39.0–52.0)
Hemoglobin: 12.5 g/dL — ABNORMAL LOW (ref 13.0–17.0)
MCV: 92.9 fL (ref 78.0–100.0)
RDW: 13.7 % (ref 11.5–15.5)
WBC: 6.7 10*3/uL (ref 4.0–10.5)

## 2011-01-25 LAB — URINE CULTURE: Culture: NO GROWTH

## 2011-01-25 LAB — COMPREHENSIVE METABOLIC PANEL
Albumin: 2.6 g/dL — ABNORMAL LOW (ref 3.5–5.2)
Alkaline Phosphatase: 77 U/L (ref 39–117)
BUN: 11 mg/dL (ref 6–23)
CO2: 27 mEq/L (ref 19–32)
Chloride: 106 mEq/L (ref 96–112)
Creatinine, Ser: 1.25 mg/dL (ref 0.50–1.35)
GFR calc Af Amer: 65 mL/min — ABNORMAL LOW (ref >90)
GFR calc non Af Amer: 56 mL/min — ABNORMAL LOW (ref >90)
Glucose, Bld: 110 mg/dL — ABNORMAL HIGH (ref 70–99)
Potassium: 3.9 mEq/L (ref 3.5–5.1)
Total Bilirubin: 0.3 mg/dL (ref 0.3–1.2)

## 2011-01-25 LAB — PROTIME-INR: INR: 2.5 — ABNORMAL HIGH (ref 0.00–1.49)

## 2011-01-26 LAB — PROTIME-INR: INR: 2.54 — ABNORMAL HIGH (ref 0.00–1.49)

## 2011-01-27 LAB — PROTIME-INR
INR: 2.34 — ABNORMAL HIGH (ref 0.00–1.49)
Prothrombin Time: 26 seconds — ABNORMAL HIGH (ref 11.6–15.2)

## 2011-01-28 LAB — CBC
Platelets: 189 10*3/uL (ref 150–400)
RDW: 13.4 % (ref 11.5–15.5)
WBC: 22.9 10*3/uL — ABNORMAL HIGH (ref 4.0–10.5)

## 2011-01-28 LAB — BASIC METABOLIC PANEL
Calcium: 9.1 mg/dL (ref 8.4–10.5)
GFR calc non Af Amer: 65 mL/min — ABNORMAL LOW (ref >90)
Glucose, Bld: 182 mg/dL — ABNORMAL HIGH (ref 70–99)
Sodium: 140 mEq/L (ref 135–145)

## 2011-01-28 LAB — PROTIME-INR: INR: 2.49 — ABNORMAL HIGH (ref 0.00–1.49)

## 2011-01-29 ENCOUNTER — Inpatient Hospital Stay (HOSPITAL_COMMUNITY): Payer: No Typology Code available for payment source

## 2011-01-29 LAB — CBC
MCH: 30.6 pg (ref 26.0–34.0)
Platelets: 204 10*3/uL (ref 150–400)
RBC: 4.12 MIL/uL — ABNORMAL LOW (ref 4.22–5.81)
WBC: 21.6 10*3/uL — ABNORMAL HIGH (ref 4.0–10.5)

## 2011-01-29 LAB — BASIC METABOLIC PANEL
BUN: 17 mg/dL (ref 6–23)
GFR calc Af Amer: 62 mL/min — ABNORMAL LOW (ref >90)
GFR calc non Af Amer: 53 mL/min — ABNORMAL LOW (ref >90)
Potassium: 5.1 mEq/L (ref 3.5–5.1)

## 2011-01-29 LAB — PROTIME-INR: Prothrombin Time: 26 seconds — ABNORMAL HIGH (ref 11.6–15.2)

## 2011-01-30 ENCOUNTER — Inpatient Hospital Stay (HOSPITAL_COMMUNITY): Payer: No Typology Code available for payment source

## 2011-01-30 LAB — CBC
Hemoglobin: 12.2 g/dL — ABNORMAL LOW (ref 13.0–17.0)
MCHC: 32.3 g/dL (ref 30.0–36.0)
Platelets: 159 10*3/uL (ref 150–400)
RDW: 13.3 % (ref 11.5–15.5)

## 2011-01-30 LAB — PROTIME-INR
INR: 1.97 — ABNORMAL HIGH (ref 0.00–1.49)
Prothrombin Time: 22.8 seconds — ABNORMAL HIGH (ref 11.6–15.2)

## 2011-01-31 ENCOUNTER — Encounter: Payer: No Typology Code available for payment source | Admitting: *Deleted

## 2011-01-31 LAB — CBC
MCH: 30 pg (ref 26.0–34.0)
MCHC: 33.2 g/dL (ref 30.0–36.0)
Platelets: 191 10*3/uL (ref 150–400)
RBC: 4.17 MIL/uL — ABNORMAL LOW (ref 4.22–5.81)
RDW: 13.2 % (ref 11.5–15.5)

## 2011-01-31 LAB — BASIC METABOLIC PANEL
BUN: 17 mg/dL (ref 6–23)
Calcium: 8.6 mg/dL (ref 8.4–10.5)
Creatinine, Ser: 1.3 mg/dL (ref 0.50–1.35)
GFR calc non Af Amer: 53 mL/min — ABNORMAL LOW (ref >90)
Glucose, Bld: 115 mg/dL — ABNORMAL HIGH (ref 70–99)

## 2011-01-31 LAB — PROTIME-INR: Prothrombin Time: 18.3 seconds — ABNORMAL HIGH (ref 11.6–15.2)

## 2011-02-01 LAB — CBC
HCT: 38.5 % — ABNORMAL LOW (ref 39.0–52.0)
MCH: 30.2 pg (ref 26.0–34.0)
MCV: 91.7 fL (ref 78.0–100.0)
Platelets: 195 10*3/uL (ref 150–400)
RDW: 13.3 % (ref 11.5–15.5)

## 2011-02-03 NOTE — Discharge Summary (Signed)
NAME:  Grant Lara, Grant Lara                  ACCOUNT NO.:  [id number]  MEDICAL RECORD NO.:  [id number]  LOCATION:  2409                         FACILITY:  [facility]  PHYSICIAN:  Leisa Lenz, MD        DATE OF BIRTH:  [birth year]  DATE OF ADMISSION:  01/23/2011 DATE OF DISCHARGE:  02/02/2011                              DISCHARGE SUMMARY   DISCHARGE MEDICATIONS: 1. Lisinopril 2.5 mg tablet 1 daily by mouth. 2. Coreg 3.125 mg tablet twice daily with meals. 3. Coumadin 5 mg tablet once daily. 4. Simethicone 80 mg tablets 4 times a day. 5. Oxycodone 5 mg IR every 4 hours as needed for pain. 6. Levofloxacin 750 mg tablets, take once daily for a total of 5 days.  DISCHARGE DIAGNOSES: 1. Abdominal pain - resolved upon discharge, determined to be most     likely secondary to narrowing and circumferential thickening     proximal to the ileocolic anastomosis. 2. Crohn disease. 3. Right lower lobe community-acquired pneumonia. 4. History of chronic systolic dysfunction. 5. Hypertension. 6. Patent foramen ovale. 7. Coronary artery disease. 8. Cerebrovascular accident.  DISPOSITION AND FOLLOWUP:  The patient was discharged from the hospital in stable condition.  His pain has resolved.  He was tolerating solids and liquids.  Also, the patient was ambulating upon discharge.  He will need to follow up with primary care physician in approximately 2-4 weeks.  CONSULTATION:  GI.  DIAGNOSTIC STUDIES:  During the hospitalization: January 23, 2011, chest x-ray, right lower lobe pneumonia. January 24, 2011, CT of the abdomen and pelvis without contrast, short segment of the ileum just proximal to the anastomosis with circumferential wall thickening and luminal narrowing.  Likely represents fibrous stenotic change perhaps with superimposed acute inflammation given clinical presentation, bilateral renal lesions nearing complexity, nonobstructing right upper pole renal stone. Followup ultrasound  recommended. January 25, 2011, abdominal x-ray, dilated small bowel loops in the right lower quadrant, maybe due to stenosis from Crohn disease but cannot exclude developing partial small bowel obstruction, no free air, no active lung disease. January 29, 2011, small bowel series mid to distal small bowel dilatation suspicious for low-grade partial small bowel obstruction. Given the appearance of recent CT likely due to wall thickening proximal to the ileocolic anastomosis, status post partial right hemicolectomy, proximal small bowel loops are normal in caliber suggesting against high- grade small bowel obstruction. January 30, 2011, chest x-ray, hyperaeration consistent with COPD. Pneumonia resolved.  HISTORY OF PRESENT ILLNESS:  The patient is a very pleasant 70 year old male with history outlined below who presents to emergency room with complaints of abdominal pain.  He describes pain as intermittent in the right lower quadrant, 10/10 in severity with no specific aggravating or alleviating factor.  Sudden onset and nonradiating, sharp in nature.The patient denies fevers and chills, urinary concerns.  No chest pain or shortness of breath.  No other systemic symptoms.  The patient also denies problems with bowel movements, no blood in urine or stool.  PHYSICAL EXAMINATION:  VITAL SIGNS:  Temperature 98.1, pulse 71, respirations 18, blood pressure 141/83, saturation 95% on room air. GENERAL:  Sitting in bed, not in acute distress. CARDIOVASCULAR:  Regular rate and rhythm.  S1 and S2 present. LUNGS:  Clear to auscultation bilaterally.  No wheezing, rhonchi, or rales. ABDOMEN:  Soft, nontender, nondistended.  Bowel sounds present. EXTREMITIES:  No edema. NEUROLOGIC:  Grossly nonfocal.  BLOOD WORK:  Sodium 139, potassium 3.7, chloride 105, bicarb 28, BUN 17, creatinine 1.3, glucose 115.  WBC 10.7, hemoglobin 12.5, platelets 191.  HOSPITAL COURSE BY PROBLEM: 1. Right lower lobe  pneumonia - the patient started treatment with     Levaquin and will continue 5 more days upon discharge.  He has     clinically improved and repeat chest x-ray has showed resolution of     pneumonia.  He has remained afebrile throughout the     hospitalization, and there was no leukocytosis. 2. Abdominal pain - this was determined to be secondary to luminal     narrowing and circumferential thickening proximal to the ileocolic     anastomosis.  The patient has been seen by GI during the     hospitalization.  Recommendation was to continue steroids.  He has     completed the treatment and will not need further steroids upon     discharge.  Upon discharge, the patient had been tolerating solids     and liquids, has had regular bowel movements, no blood in the urine     or stool.  He will have a followup with [facility] in     approximately 2-4 weeks upon discharge. 3. History of DVT - on chronic Coumadin.  The patient has required     adjustment in Coumadin dosing.  He was started on heparin and     bridged to Coumadin.  He will continue to take Coumadin in an     outpatient setting.  Over 30 minutes was spent on discharging the patient.    ______________________________ Leisa Lenz, MD     AD/MEDQ  D:  01/31/2011  T:  02/01/2011  Job:  [id number]  Electronically Signed by Leisa Lenz MD on 02/03/2011 04:13:36 PM

## 2011-02-05 NOTE — Consult Note (Signed)
NAME:  Grant Lara, ISAACSON                  [place] NO.:  [id number]  MEDICAL RECORD NO.:  [id number]  LOCATION:  3016                         FACILITY:  [facility]  PHYSICIAN:  Haywood Lasso, M.D.DATE OF BIRTH:  09-23-1940  DATE OF CONSULTATION:  01/24/2011 DATE OF DISCHARGE:                                CONSULTATION   PRIMARY CARE PHYSICIAN:  Lucianne Lei, MD.  CARDIOLOGIST:  Minus Breeding, MD, [facility].  NEUROLOGIST:  Pramod P. Leonie Man, MD.  GASTROINTESTINAL:  Mayme Genta, MD.  REQUESTING PHYSICIAN:  Eleonore Chiquito, MD.  REASON FOR CONSULT:  Abdominal pain.  BRIEF HISTORY:  The patient is a 70 year old male with a history of Crohn's disease who has undergone 2 small bowel resections back in 1994, and 1996.  He says he has not had any treatment for over 10-15 years, he cannot remember how long.  He was doing well up until yesterday evening. He said he had supper around 7 or 8 o'clock and then went to [employer] for a cup of coffee, and then got a severe mid-epigastric abdominal pain.  EMS was there to see someone else and apparently a second ambulance was called.  He was transported to the ER at [facility] where he was treated with pain medications.  He says the pain meds have made his pain better, but it has never completely resolved and it seems to come and go.  He did not have breakfast, but he did have a regular lunch with [place] and dressing.  He did okay with this.  He has also been able to get up and walk to the bathroom, which does not make his pain any worse.  He continues to have some mild pain, but appears in no distress. He has a full regular tray in front of him, but is afraid to eat the meat.  He is also on his oral meds including Coumadin.  We were asked to see for abdominal pain, question of acute abdomen.  PAST MEDICAL HISTORY: 1. Right thalamic stroke, February 2012, with a history of TIAs. 2. Patent foramen ovale. 3. Questionable history of coronary artery  disease, a history of     cardiomyopathy; I do not have a cath report or stress test showing     ischemia.  EF was 35%. 4. History of hypertension and subsequent hypotension in March along     with bradycardia on blood pressure medicines. 5. History of DVT on Coumadin. 6. Dyslipidemia. 7. History of tobacco use. 8. History of diabetes, but he denies this, says he has not had any     problems. 9. He has a history of Crohn's disease, as noted above.  PAST SURGICAL HISTORY:  He had small bowel resections in 1994, and 1996; he cannot remember who did it.  He has also had his appendix removed.  FAMILY HISTORY:  Positive for hypertension.  His father died with a massive MI.  Mother has coronary artery disease and some heart disease.  SOCIAL HISTORY:  He smoked for 50 years up to a pack a day.  He quit in March 2012.  Alcohol none.  Drugs none.  He is married.  He worked  on the [employer] here in [place] for 25 years.  REVIEW OF SYSTEMS:  CONSTITUTIONAL:  Fever, none.  Weight, he is not aware of any changes.  INTEGUMENTARY:  He thinks he might have a rash that he can see after taking a shower on his shoulders.  It does not seem to be present now.  CNS:  He has some problems with memory issues. He has had a stroke.  No history of seizures.  No syncope.  CARDIAC:  No chest pain or palpitations.  PULMONARY:  He denies dyspnea on exertion. He has a cough with some white colored sputum.  No orthopnea or PND. GI:  Negative for GERD.  No diarrhea or constipation.  No nausea or vomiting.  No blood in his stool.  His last bowel movement was yesterday morning.  GU:  No problems except for a rather slow stream.  LOWER EXTREMITIES:  The legs tire easily with walking.  No edema. MUSCULOSKELETAL:  The side of his legs are hurting some, and some arthritis in his shoulders.  No other complaints.  MEDICATIONS:  Home medications listed are lisinopril 2.5 mg daily, Coreg 3.125 mg b.i.d., Coumadin 5 mg  Monday, Wednesday, Friday, and Saturday, and 2.5 mg Tuesday, Thursday, and Sunday.  Hospital medications, he said Zithromax, Coreg, Lovenox, fentanyl, Levoxyl, Rocephin, Coumadin, morphine, and oxycodone.  ALLERGIES:  NONE KNOWN.  PHYSICAL EXAMINATION:  GENERAL:  This is a well-nourished well-developed [place] American male in no acute distress.  He has had supper at his bedside and is taking p.o.  He notes that lunch did not make him symptomatically better or worse. VITAL SIGNS:  Temperature is 97.9, heart rate is 76, blood pressure is 151/78, respiratory rate is 18, and sats are 100% on room air. HEAD:  Normocephalic. EARS, NOSE, THROAT, AND MOUTH:  Normal mucosa. NECK:  Trachea is in the midline.  Thyroid is nonpalpable.  There is no JVD.  No bruits. RESPIRATORY:  Effort is normal.  Chest is clear to auscultation. CARDIAC:  Normal S1.  Split S2.  No murmur or rub.  Pulses are +2 and equal bilaterally.  Chest is nontender. ABDOMEN:  Bowel sounds are present.  He is not distended.  He is not tender.  He complains of pain in the midepigastrium, but no increased discomfort of note with palpation.  He has a midline abdominal scar below the umbilicus; it is well healed.  There are no abscesses, hernias, or masses. GU/RECTAL: Deferred. LYMPHADENOPATHY:  None palpated. MUSCULOSKELETAL:  No changes. SKIN:  No changes noted. NEUROLOGIC:  No focal deficits.  He is alert and oriented.  He does have some memory issues.  LABORATORY DATA:  Protime on January 22, 2011, was 32.4.  INR is 3.15. Protime today is 21.3, INR is 1.81.  UA is negative.  Lipase on January 23, 2011, was 54, total bilirubin 0.4, alk phos 95, SGOT 14, SGPT 15, and CK troponin is negative x1.  DIAGNOSTIC TESTS:  Portable chest x-ray on admission shows an infiltrate in the right lower lobe consistent with pneumonia.  CT of the abdomen and pelvis without contrast was obtained.  This shows a partially decompressed  bladder.  The bowel anastomotic suture with partial colectomy.  There is a dilated loop of small bowel measuring up to 3.2 cm proximal to a circumferentially narrowed segment of small bowel at the ileocolonic anastomosis of the terminal ileum.  There is no lymphadenopathy, free fluid, or intraperitoneal air.  Intraabdominal organs were poorly visualized without contrast.  There was no biliary ductal dilatation or gallstones.  The spleen, pancreas, and adrenals were unremarkable.  There were several renal lesions, some of which were simple cysts, some of which were hyperdense or too small to characterize.  There is nonobstructing right upper pole stone.  No hydronephrosis, hydroureter.  No ureteral tract calculi noted.  IMPRESSION: 1. Abdominal pain with a segment of small bowel dilatation proximal,     and small bowel stenosis distally. 2. History of Crohn's disease with 2 small bowel resections in 1994     and 1996. 3. History coronary disease/cardiomyopathy, ejection fraction of 35%. 4. History of transient ischemic attack/cerebrovascular accident,     right thalamic in February 2012, with some memory issues since. 5. History of patent foramen ovale. 6. History of deep venous thrombosis, on Coumadin. 7. Right lower lobe pneumonia. 8. Chronic obstructive pulmonary disease tobacco use, quit March 2012. 9. Dyslipidemia.  PLAN:  I would suggest going slowly with orals, full liquids or at least low residual for now.  He is on antibiotics.  We will order 3-way and labs in the morning and follow with you.     Lydia Guiles, P.A.   ______________________________ Haywood Lasso, M.D.    WDJ/MEDQ  D:  01/24/2011  T:  01/24/2011  Job:  [id number]  cc:   Lucianne Lei, M.D. Minus Breeding, MD, Alex. Leonie Man, MD Mayme Genta, M.D.  Electronically Signed by Earnstine Regal P.A. on 02/03/2011 03:06:56 PM Electronically Signed by Neldon Mc M.D. on  02/05/2011 10:28:44 AM

## 2011-02-11 NOTE — H&P (Signed)
NAME:  Grant Lara, Grant Lara                  [place] NO.:  [id number]  MEDICAL RECORD NO.:  [id number]  LOCATION:  [facility]                         FACILITY:  [facility]  PHYSICIAN:  Leana Gamer, MDDATE OF BIRTH:  07-Oct-1940  DATE OF ADMISSION:  01/23/2011 DATE OF DISCHARGE:                             HISTORY & PHYSICAL   CHIEF COMPLAINT:  Abdominal pain.  HISTORY OF PRESENT ILLNESS:  [name] is a very lovely 70 year old gentleman who presents to the emergency room with complaints of abdominal pain.  The patient states that the pain occurred periumbilically and mostly in the right lower quadrant.  He states that at its worst the pain was a 10/10, however, has decreased down to about a 3/10.  The patient states that pain is intermittent, it was an insidious sudden onset, it is nonradiating and sharp in nature.  He does not associate the pain with any other symptoms.  Here in the emergency room, the patient underwent a workup and was found to have pneumonia.  Urinalysis is still pending.  Please note the patient has a history of Crohn disease with partial colectomy x2 in the past.  The patient states that his Crohn disease has been in remission and he has had no need for any chronic medications, and he has not been bothered by Crohn disease in more than 10 years.  The patient denies any fever or chills.  He denies any nausea, vomiting, or diarrhea.  The patient states that his last meal was approximately 1 hour before the onset of pain which consisted of steak and rice.  The patient has had no problems with bowel movement.  He has had formed bowel movements and his last 1 was yesterday.  PAST MEDICAL HISTORY:  Significant for: 1. CVA. 2. Patent foramen ovale. 3. Coronary artery disease. 4. Chronic systolic dysfunction. 5. Hypertension.  FAMILY HISTORY:  The patient states that hypertension and heart disease runs in his family.  SOCIAL HISTORY:  The patient resides with his  wife.  There is no tobacco, alcohol, or drug use.  CURRENT MEDICATIONS:  Include the following: 1. Lisinopril 2.5 mg p.o. daily. 2. Coreg 3.125 mg p.o. b.i.d. 3. Coumadin 5 mg p.o. on Monday, Wednesday, Friday, Sunday and 0.5 mg     on Tuesday, Thursday, and Saturday.  PRIMARY CARE PHYSICIAN:  Lucianne Lei, M.D.  ALLERGIES:  No known drug allergies.  REVIEW OF SYSTEMS:  All other systems negative.  STUDIES IN THE EMERGENCY ROOM:  The patient had a hemogram which shows a white blood cell count of 6.7, hemoglobin of 15, hematocrit of 44, platelet count of 210.  Sodium is 141, potassium 4.2, chloride 25, BUN 14, creatinine 1.6, glucose 116.  Cardiac enzymes are negative.  INR subtherapeutic at 1.81.  Chest x-ray shows right lower lobe pneumonia.  CT of the abdomen and pelvis without contrast shows short segment of the ileum just proximal to the anastomosis with circumferential wall thickening/luminal narrowing.  This likely represents fibrostenotic change, perhaps with superimposed acute inflammation given the clinical presentation.  Impression: 1. Bilateral renal lesions of varying complexity.  Followup ultrasound     recommended. 2. Nonobstructive renal pole stones.  PHYSICAL EXAMINATION:  GENERAL:  The patient is laying in bed comfortably in no acute distress. VITAL SIGNS:  Temperature is 98, heart rate 58, blood pressure 128/74, respiratory rate 14, O2 saturations 100% on room air. HEENT:  Normocephalic, atraumatic.  Pupils equally round, reactive to light and accommodation.  Extraocular movements are intact.  Oropharynx is moist.  No exudate, erythema, or lesions are noted. NECK:  Trachea is midline.  No masses.  No thyromegaly.  No JVD.  No carotid bruits. RESPIRATORY:  The patient has a normal respiratory effort and equal excursion bilaterally.  No wheezing or rhonchi noted. CARDIOVASCULAR:  He has got normal S1 and S2.  No murmurs, rubs, or gallops noted.  PMI is  nondisplaced.  No heaves or thrills on palpation. ABDOMEN:  The patient has diffuse abdominal tenderness.  No guarding. No rebound.  No masses.  No hepatosplenomegaly noted. EXTREMITIES:  No clubbing, cyanosis, or edema. MUSCULOSKELETAL:  He has got no warmth, swelling, or erythema around the joints and there is no spinal tenderness noted. NEUROLOGICAL:  He has no focal neurological deficits.  Cranial nerves II- XII are grossly intact.  ASSESSMENT AND PLAN:  This is a patient who presents with: 1. Right lower lobe community-acquired pneumonia.  We will treat with     IV Levaquin. 2. He presents with abdominal pain.  The patient does have a history     of Crohn disease.  I find nothing in the examination or history     that suggest that this is a flare of the Crohn disease.  Most     notably the patient has no diarrhea associated with this.  I think     at this point I will hold off on starting any anti-inflammatories     or any antibiotics specifically directed towards the abdomen.  I     would like to see what the urinalysis shows to see whether or not     this patient may be having a urinary tract infection, which may be     contributing to his pain.  I think that we should to treat the     pneumonia and await further testing and leave the patient off any     abdominal specific medications and observe his clinical course     before making any further decisions.  Right now the patient is     clinically stable.  He is afebrile.  He is hemodynamically stable,     and he has no nausea or vomiting or diarrhea.  We will go ahead and     get the patient started on a heart healthy diet, and we will make     further     decisions on the patient's care based upon his clinical course and     initial testing.  The patient will have Lovenox for DVT prophylaxis     until he is therapeutic on his Coumadin, which will be titrated by     pharmacy.  He will be restarted on his lisinopril and  Coreg.     Leana Gamer, MD     MAM/MEDQ  D:  01/24/2011  T:  01/24/2011  Job:  [id number]  cc:   Lucianne Lei, M.D.  Electronically Signed by Liston Alba MD on 02/11/2011 01:15:17 PM

## 2011-02-14 NOTE — Consult Note (Signed)
  NAME:  Grant Lara, Grant Lara                  [place] NO.:  [id number]  MEDICAL RECORD NO.:  [id number]  LOCATION:  7989                         FACILITY:  [facility]  PHYSICIAN:  Arta Silence, MD     DATE OF BIRTH:  09-Sep-1940  DATE OF CONSULTATION:  01/26/2011 DATE OF DISCHARGE:                                CONSULTATION   REASON FOR CONSULTATION:  Abdominal pain.  REQUEST PHYSICIAN:  Dr. Elane Fritz.  CHIEF COMPLAINT:  Abdominal pain.  HISTORY OF PRESENT ILLNESS:  Grant Lara is a 70 year old gentleman on chronic anticoagulation for history of patent foramen ovale as well as history of stroke.  He has a remote history of Crohn disease requiring partial resections with ileocolic anastomosis, last over 15 years ago. He has been on no treatment for his Crohn since that time.  He developed abdominal pain a few days ago.  Endorses progressively severe periumbilical pain with associated distention.  He has no vomiting and he continues to have bowel movements but less frequently than he has been used to no blood in his stool, or fevers.  CT scan showed a prominent small bowel loops without obvious obstruction.  There is some circumferential thickening and luminal narrowing just proximal to the ileocolic anastomosis.  PAST MEDICAL HISTORY, PAST SURGICAL HISTORY, HOME MEDICATIONS, ALLERGIES, FAMILY HISTORY, SOCIAL HISTORY, REVIEW OF SYSTEMS:  All from dictated note from Dr. Rodena Piety, dated January 24, 2011.  I have reviewed and I agreed.  PHYSICAL EXAMINATION:  VITAL SIGNS:  Blood pressure is 142/68, heart rate 68, respiratory rate 16, and temperature 97.4. GENERAL:  Grant Lara is in no acute distress. NECK:  Supple. HEENT:  EYES, sclerae anicteric.  Conjunctivae are pink.  Mucous membranes slightly dry.  LUNGS:  Clear. HEART:  Regular. ABDOMEN:  Mild distention with some tympany, mild periumbilical tenderness.  Bowel sounds are present but hypoactive.  No peritonitis. EXTREMITIES:  No peripheral  cyanosis or clubbing. NEUROLOGIC:  Diffusely nonfocal without lateralizing signs. PSYCHIATRIC:  Normal mood and affect. LYMPHATICS:  No palpable axillary, submandibular, or supraclavicular adenopathy.  RADIOLOGIC STUDIES:  As reviewed above.  LABORATORY STUDIES:  Hemoglobin is 12.5, white count is 6.7, platelet count is 178, sodium 139, potassium 3.9, chloride 106, bicarb 27, BUN 11, creatinine 1.25.  Liver tests normal.  IMPRESSION:  Grant Lara is a 70 year old gentleman presenting with abdominal pain, with prominent small bowel loops, history of Crohn disease.  Some luminal narrowing and circumferential thickening just proximal to the ileocolic anastomosis.  I suspect he has morbid component of fiber stenotic changes from his Crohn and from his prior surgery but active Crohn superimposed on this cannot be excluded.  PLAN: 1. We will start IV steroids. 2. We will plan on doing colonoscopy to further evaluate this area.     Given his anticoagulation and need for heparin bridge, we will     tentatively plan this for Monday.     Arta Silence, MD     WO/MEDQ  D:  01/26/2011  T:  01/26/2011  Job:  [id number]  Electronically Signed by Arta Silence  on 02/14/2011 03:25:30 PM

## 2011-02-28 ENCOUNTER — Ambulatory Visit (INDEPENDENT_AMBULATORY_CARE_PROVIDER_SITE_OTHER): Payer: No Typology Code available for payment source | Admitting: *Deleted

## 2011-02-28 DIAGNOSIS — I639 Cerebral infarction, unspecified: Secondary | ICD-10-CM

## 2011-02-28 DIAGNOSIS — I2699 Other pulmonary embolism without acute cor pulmonale: Secondary | ICD-10-CM

## 2011-02-28 DIAGNOSIS — I82409 Acute embolism and thrombosis of unspecified deep veins of unspecified lower extremity: Secondary | ICD-10-CM

## 2011-02-28 DIAGNOSIS — I6350 Cerebral infarction due to unspecified occlusion or stenosis of unspecified cerebral artery: Secondary | ICD-10-CM

## 2011-02-28 LAB — POCT INR: INR: 3.1

## 2011-03-28 ENCOUNTER — Encounter: Payer: Self-pay | Admitting: Physician Assistant

## 2011-03-28 ENCOUNTER — Ambulatory Visit (INDEPENDENT_AMBULATORY_CARE_PROVIDER_SITE_OTHER): Payer: No Typology Code available for payment source | Admitting: *Deleted

## 2011-03-28 ENCOUNTER — Encounter: Payer: No Typology Code available for payment source | Admitting: *Deleted

## 2011-03-28 ENCOUNTER — Ambulatory Visit (INDEPENDENT_AMBULATORY_CARE_PROVIDER_SITE_OTHER): Payer: No Typology Code available for payment source | Admitting: Physician Assistant

## 2011-03-28 DIAGNOSIS — E785 Hyperlipidemia, unspecified: Secondary | ICD-10-CM

## 2011-03-28 DIAGNOSIS — I2699 Other pulmonary embolism without acute cor pulmonale: Secondary | ICD-10-CM

## 2011-03-28 DIAGNOSIS — I10 Essential (primary) hypertension: Secondary | ICD-10-CM

## 2011-03-28 DIAGNOSIS — I2510 Atherosclerotic heart disease of native coronary artery without angina pectoris: Secondary | ICD-10-CM

## 2011-03-28 DIAGNOSIS — I82409 Acute embolism and thrombosis of unspecified deep veins of unspecified lower extremity: Secondary | ICD-10-CM

## 2011-03-28 DIAGNOSIS — I639 Cerebral infarction, unspecified: Secondary | ICD-10-CM

## 2011-03-28 DIAGNOSIS — I6350 Cerebral infarction due to unspecified occlusion or stenosis of unspecified cerebral artery: Secondary | ICD-10-CM

## 2011-03-28 LAB — POCT INR: INR: 3.2

## 2011-03-28 LAB — BASIC METABOLIC PANEL
CO2: 29 mEq/L (ref 19–32)
Calcium: 9.2 mg/dL (ref 8.4–10.5)
Chloride: 106 mEq/L (ref 96–112)
Sodium: 142 mEq/L (ref 135–145)

## 2011-03-28 MED ORDER — CARVEDILOL 3.125 MG PO TABS: 3.1250 mg | ORAL_TABLET | Freq: Two times a day (BID) | ORAL | Status: DC

## 2011-03-28 MED ORDER — PRAVASTATIN SODIUM 20 MG PO TABS: 20.0000 mg | ORAL_TABLET | Freq: Every evening | ORAL | Status: DC

## 2011-03-28 NOTE — Progress Notes (Signed)
[facility] [place], [place]  [address] Phone: [phone] Fax:  [phone]  Date:  03/28/2011   Name:  Grant Lara       DOB:  [birth year] MRN:  [id number]  PCP:  Dr. Criss Rosales Primary Cardiologist:  Dr. Minus Breeding    History of Present Illness: Grant Lara is a 70 y.o. male who presents for follow up.  He has a history of CAD, ICM, systolic CHF, DVT and pulmonary emboli, chronic Coumadin therapy, hyperhomocystinemia, diabetes, hyperlipidemia, patent foramen ovale, hypertension, Crohn's disease, prior stroke and nephrolithiasis.  LHC 9/05 with Dr. Einar Gip:  dLM 20-30%, LAD 85%, oD1 20-30%.  PCI:  Taxus DES to LAD; Dx jailed and tx with POBA.  Last myoview 12/10: inf scar, no ischemia, EF 29%.  Last echo 2/12: EF 30-35%, trivial AI, mild RAE.   He was last seen by Dr. Minus Breeding 9/11.  Of note, the patient has been noncompliant with medications.  It has been noted that if he is compliant with medications, consideration can be given toward proceeding with referral to electrophysiology for ICD consideration.  Plan was to see him back in 6 months.  We have not seen him since that time.  He was admitted 10/10-10/20 with abdominal pain most likely due to narrowing and circumferential thickening proximal to the ileocolic anastomosis noted on CT scan of the abdomen as well as RLL community acquired pneumonia.  Labs: Hemoglobin 12.7, potassium 3.7, creatinine 1.30, ALT 14, troponin negative x1, BNP 275, chest x-ray COPD, no pneumonia.  The patient denies chest pain, shortness of breath, syncope, orthopnea, PND or significant pedal edema.  He describes Class 2 symptoms.  No palpitations.  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  He wants to see about getting surgery.  He was last seen 3 years ago for this.  I have recommended he see his surgeon first.  If surgery is planned, we should be contacted.  Of note, he can achieve 4 METs without dyspnea or chest  pain.  Past Medical History  Diagnosis Date  . Diabetes mellitus   . Hypertension   . Dilated cardiomyopathy     Last echo 2/12: EF 30-35%, trivial AI, mild RAE.  [name] Chronic systolic heart failure   . CAD (coronary artery disease)     LHC 9/05 with Dr. Einar Gip:  dLM 20-30%, LAD 85%, oD1 20-30%.  PCI:  Taxus DES to LAD; Dx jailed and tx with POBA.  Last myoview 12/10: inf scar, no ischemia, EF 29%.  . DVT (deep venous thrombosis)   . Pulmonary embolus     chronic coumadin  . Hyperhomocystinemia   . PFO (patent foramen ovale)   . HLD (hyperlipidemia)   . Crohn's disease   . History of stroke   . Nephrolithiasis     Current Outpatient Prescriptions  Medication Sig Dispense Refill  . carvedilol (COREG) 3.125 MG tablet Take 1 tablet (3.125 mg total) by mouth 2 (two) times daily.  60 tablet  11  . lisinopril (PRINIVIL,ZESTRIL) 5 MG tablet Take 0.5 tablets (2.5 mg total) by mouth daily.  30 tablet  6  . rosuvastatin (CRESTOR) 10 MG tablet Take 1 tablet (10 mg total) by mouth at bedtime.  30 tablet  6  . warfarin (COUMADIN) 5 MG tablet Take 1 tablet (5 mg total) by mouth as directed.  35 tablet  3    Allergies: No Known Allergies  History  Substance Use  Topics  . Smoking status: Current Everyday Smoker  . Smokeless tobacco: Not on file  . Alcohol Use: Not on file     ROS:  Please see the history of present illness.   He sees GI for his abdominal pain.   All other systems reviewed and negative.   PHYSICAL EXAM: VS:  BP 127/78  Pulse 79  Ht 5' 9"  (1.753 m)  Wt 153 lb 12.8 oz (69.763 kg)  BMI 22.71 kg/m2 Well nourished, well developed, in no acute distress HEENT: normal Neck: no JVD Cardiac:  normal S1, S2; RRR; no murmur Lungs:  clear to auscultation bilaterally, no wheezing, rhonchi or rales Abd: soft, nontender, no hepatomegaly Ext: no edema Skin: warm and dry Neuro:  CNs 2-12 intact, no focal abnormalities noted  EKG:   Sinus rhythm, heart rate 73, normal axis, J-point  elevation, LVH, T wave inversions leads 2, 3, aVF, no significant change compared to prior tracings.  ASSESSMENT AND PLAN:

## 2011-03-28 NOTE — Assessment & Plan Note (Signed)
Controlled.  Carvedilol is being restarted for treatment of his cardiomyopathy.

## 2011-03-28 NOTE — Assessment & Plan Note (Signed)
No angina.  He is not on aspirin due to Coumadin therapy.  He had a low risk Myoview 12/10.  If surgery is planned for his finger, I suspect he would be mild to moderate risk.  I assume that the procedure itself would be low risk.  He did not have any unstable cardiac conditions at this time.  As noted, his surgeon can contact us if surgery is planned.

## 2011-03-28 NOTE — Assessment & Plan Note (Signed)
Overall stable.  He is not taking carvedilol.  I will restart this at 3.125 mg twice daily.  Continue lisinopril.  Check a basic metabolic panel today.  Followup with Dr. Minus Breeding in 3 months.

## 2011-03-28 NOTE — Assessment & Plan Note (Signed)
Start pravastatin 20 mg q.h.s. Check lipids and LFTs at followup in 3 months.

## 2011-03-28 NOTE — Assessment & Plan Note (Signed)
We discussed the importance of quitting.

## 2011-03-28 NOTE — Patient Instructions (Addendum)
Your physician has recommended you make the following change in your medication: Start Pravachol 14m at bedtime. Restart your Carvedilol 3.1240mtwice daily.  Your physician recommends that you return for lab work in: today (BArtist  Fasting lipid and liver in 3 months at your next visit  Your physician wants you to follow-up in: 3 months with Dr HoPercival Spanish  You will receive a reminder letter in the mail two months in advance. If you don't receive a letter, please call our office to schedule the follow-up appointment.

## 2011-03-28 NOTE — Assessment & Plan Note (Signed)
He remains on Coumadin therapy.

## 2011-04-18 ENCOUNTER — Ambulatory Visit (INDEPENDENT_AMBULATORY_CARE_PROVIDER_SITE_OTHER): Payer: No Typology Code available for payment source | Admitting: *Deleted

## 2011-04-18 DIAGNOSIS — I639 Cerebral infarction, unspecified: Secondary | ICD-10-CM

## 2011-04-18 DIAGNOSIS — I2699 Other pulmonary embolism without acute cor pulmonale: Secondary | ICD-10-CM

## 2011-04-18 DIAGNOSIS — I82409 Acute embolism and thrombosis of unspecified deep veins of unspecified lower extremity: Secondary | ICD-10-CM

## 2011-04-18 DIAGNOSIS — I6350 Cerebral infarction due to unspecified occlusion or stenosis of unspecified cerebral artery: Secondary | ICD-10-CM

## 2011-04-18 LAB — POCT INR: INR: 2.7

## 2011-05-02 ENCOUNTER — Ambulatory Visit (INDEPENDENT_AMBULATORY_CARE_PROVIDER_SITE_OTHER): Payer: No Typology Code available for payment source | Admitting: *Deleted

## 2011-05-02 ENCOUNTER — Telehealth: Payer: Self-pay | Admitting: Cardiology

## 2011-05-02 DIAGNOSIS — I82409 Acute embolism and thrombosis of unspecified deep veins of unspecified lower extremity: Secondary | ICD-10-CM

## 2011-05-02 DIAGNOSIS — I2699 Other pulmonary embolism without acute cor pulmonale: Secondary | ICD-10-CM

## 2011-05-02 DIAGNOSIS — I6350 Cerebral infarction due to unspecified occlusion or stenosis of unspecified cerebral artery: Secondary | ICD-10-CM

## 2011-05-02 DIAGNOSIS — I639 Cerebral infarction, unspecified: Secondary | ICD-10-CM

## 2011-05-02 LAB — POCT INR: INR: 2.2

## 2011-05-02 NOTE — Telephone Encounter (Signed)
Pt needs clearance faxed to [facility] at 1 [phone].  Note printed and will be faxed today.

## 2011-05-02 NOTE — Telephone Encounter (Signed)
New problem Pt said that paperwork need to be faxed to workers comp for surgery. Please call back

## 2011-05-23 ENCOUNTER — Ambulatory Visit (INDEPENDENT_AMBULATORY_CARE_PROVIDER_SITE_OTHER): Payer: Medicare Other

## 2011-05-23 DIAGNOSIS — I82409 Acute embolism and thrombosis of unspecified deep veins of unspecified lower extremity: Secondary | ICD-10-CM

## 2011-05-23 DIAGNOSIS — I2699 Other pulmonary embolism without acute cor pulmonale: Secondary | ICD-10-CM

## 2011-05-23 DIAGNOSIS — I639 Cerebral infarction, unspecified: Secondary | ICD-10-CM

## 2011-05-23 DIAGNOSIS — I6350 Cerebral infarction due to unspecified occlusion or stenosis of unspecified cerebral artery: Secondary | ICD-10-CM

## 2011-05-23 LAB — POCT INR: INR: 1.8

## 2011-05-23 MED ORDER — WARFARIN SODIUM 5 MG PO TABS: ORAL_TABLET | ORAL | Status: DC

## 2011-06-20 ENCOUNTER — Ambulatory Visit (INDEPENDENT_AMBULATORY_CARE_PROVIDER_SITE_OTHER): Payer: Medicare Other | Admitting: Pharmacist

## 2011-06-20 DIAGNOSIS — I2699 Other pulmonary embolism without acute cor pulmonale: Secondary | ICD-10-CM

## 2011-06-20 DIAGNOSIS — I6350 Cerebral infarction due to unspecified occlusion or stenosis of unspecified cerebral artery: Secondary | ICD-10-CM

## 2011-06-20 DIAGNOSIS — I82409 Acute embolism and thrombosis of unspecified deep veins of unspecified lower extremity: Secondary | ICD-10-CM

## 2011-06-20 DIAGNOSIS — I639 Cerebral infarction, unspecified: Secondary | ICD-10-CM

## 2011-06-20 LAB — POCT INR: INR: 2.4

## 2011-07-01 ENCOUNTER — Ambulatory Visit: Payer: No Typology Code available for payment source | Admitting: Cardiology

## 2011-07-10 ENCOUNTER — Ambulatory Visit (INDEPENDENT_AMBULATORY_CARE_PROVIDER_SITE_OTHER): Payer: Medicare Other | Admitting: *Deleted

## 2011-07-10 DIAGNOSIS — I639 Cerebral infarction, unspecified: Secondary | ICD-10-CM

## 2011-07-10 DIAGNOSIS — I2699 Other pulmonary embolism without acute cor pulmonale: Secondary | ICD-10-CM

## 2011-07-10 DIAGNOSIS — I6350 Cerebral infarction due to unspecified occlusion or stenosis of unspecified cerebral artery: Secondary | ICD-10-CM

## 2011-07-10 DIAGNOSIS — I82409 Acute embolism and thrombosis of unspecified deep veins of unspecified lower extremity: Secondary | ICD-10-CM

## 2011-07-10 LAB — POCT INR: INR: 3.2

## 2011-07-31 ENCOUNTER — Ambulatory Visit (INDEPENDENT_AMBULATORY_CARE_PROVIDER_SITE_OTHER): Payer: Medicare Other | Admitting: Pharmacist

## 2011-07-31 DIAGNOSIS — I639 Cerebral infarction, unspecified: Secondary | ICD-10-CM

## 2011-07-31 DIAGNOSIS — I82409 Acute embolism and thrombosis of unspecified deep veins of unspecified lower extremity: Secondary | ICD-10-CM

## 2011-07-31 DIAGNOSIS — I2699 Other pulmonary embolism without acute cor pulmonale: Secondary | ICD-10-CM

## 2011-07-31 DIAGNOSIS — I6350 Cerebral infarction due to unspecified occlusion or stenosis of unspecified cerebral artery: Secondary | ICD-10-CM

## 2011-07-31 LAB — POCT INR: INR: 2.6

## 2011-09-06 ENCOUNTER — Ambulatory Visit (INDEPENDENT_AMBULATORY_CARE_PROVIDER_SITE_OTHER): Payer: Medicare Other | Admitting: Pharmacist

## 2011-09-06 DIAGNOSIS — I639 Cerebral infarction, unspecified: Secondary | ICD-10-CM

## 2011-09-06 DIAGNOSIS — I6350 Cerebral infarction due to unspecified occlusion or stenosis of unspecified cerebral artery: Secondary | ICD-10-CM

## 2011-09-06 DIAGNOSIS — I82409 Acute embolism and thrombosis of unspecified deep veins of unspecified lower extremity: Secondary | ICD-10-CM

## 2011-09-06 DIAGNOSIS — I2699 Other pulmonary embolism without acute cor pulmonale: Secondary | ICD-10-CM

## 2011-09-06 LAB — POCT INR: INR: 2.1

## 2011-10-11 ENCOUNTER — Ambulatory Visit (INDEPENDENT_AMBULATORY_CARE_PROVIDER_SITE_OTHER): Payer: Medicare Other | Admitting: *Deleted

## 2011-10-11 DIAGNOSIS — I6350 Cerebral infarction due to unspecified occlusion or stenosis of unspecified cerebral artery: Secondary | ICD-10-CM

## 2011-10-11 DIAGNOSIS — I2699 Other pulmonary embolism without acute cor pulmonale: Secondary | ICD-10-CM

## 2011-10-11 DIAGNOSIS — I639 Cerebral infarction, unspecified: Secondary | ICD-10-CM

## 2011-10-11 DIAGNOSIS — I82409 Acute embolism and thrombosis of unspecified deep veins of unspecified lower extremity: Secondary | ICD-10-CM

## 2011-11-04 ENCOUNTER — Inpatient Hospital Stay (HOSPITAL_COMMUNITY)
Admission: EM | Admit: 2011-11-04 | Discharge: 2011-11-08 | DRG: 389 | Disposition: A | Discharge status: Skilled Nursing Facility | Payer: Medicare Other | Attending: Internal Medicine | Admitting: Internal Medicine

## 2011-11-04 ENCOUNTER — Encounter (HOSPITAL_COMMUNITY): Payer: Self-pay | Admitting: *Deleted

## 2011-11-04 ENCOUNTER — Emergency Department (HOSPITAL_COMMUNITY): Payer: Medicare Other

## 2011-11-04 DIAGNOSIS — I420 Dilated cardiomyopathy: Secondary | ICD-10-CM | POA: Diagnosis present

## 2011-11-04 DIAGNOSIS — R918 Other nonspecific abnormal finding of lung field: Secondary | ICD-10-CM | POA: Diagnosis present

## 2011-11-04 DIAGNOSIS — I82409 Acute embolism and thrombosis of unspecified deep veins of unspecified lower extremity: Secondary | ICD-10-CM

## 2011-11-04 DIAGNOSIS — E785 Hyperlipidemia, unspecified: Secondary | ICD-10-CM

## 2011-11-04 DIAGNOSIS — Z8673 Personal history of transient ischemic attack (TIA), and cerebral infarction without residual deficits: Secondary | ICD-10-CM | POA: Diagnosis present

## 2011-11-04 DIAGNOSIS — R413 Other amnesia: Secondary | ICD-10-CM

## 2011-11-04 DIAGNOSIS — Z86718 Personal history of other venous thrombosis and embolism: Secondary | ICD-10-CM

## 2011-11-04 DIAGNOSIS — M79609 Pain in unspecified limb: Secondary | ICD-10-CM | POA: Diagnosis present

## 2011-11-04 DIAGNOSIS — M79604 Pain in right leg: Secondary | ICD-10-CM

## 2011-11-04 DIAGNOSIS — Z86711 Personal history of pulmonary embolism: Secondary | ICD-10-CM | POA: Diagnosis present

## 2011-11-04 DIAGNOSIS — F17200 Nicotine dependence, unspecified, uncomplicated: Secondary | ICD-10-CM

## 2011-11-04 DIAGNOSIS — Z23 Encounter for immunization: Secondary | ICD-10-CM

## 2011-11-04 DIAGNOSIS — I10 Essential (primary) hypertension: Secondary | ICD-10-CM

## 2011-11-04 DIAGNOSIS — Z9861 Coronary angioplasty status: Secondary | ICD-10-CM

## 2011-11-04 DIAGNOSIS — I639 Cerebral infarction, unspecified: Secondary | ICD-10-CM

## 2011-11-04 DIAGNOSIS — K56609 Unspecified intestinal obstruction, unspecified as to partial versus complete obstruction: Principal | ICD-10-CM

## 2011-11-04 DIAGNOSIS — J984 Other disorders of lung: Secondary | ICD-10-CM | POA: Diagnosis present

## 2011-11-04 DIAGNOSIS — R9431 Abnormal electrocardiogram [ECG] [EKG]: Secondary | ICD-10-CM

## 2011-11-04 DIAGNOSIS — M79671 Pain in right foot: Secondary | ICD-10-CM

## 2011-11-04 DIAGNOSIS — J439 Emphysema, unspecified: Secondary | ICD-10-CM

## 2011-11-04 DIAGNOSIS — M79672 Pain in left foot: Secondary | ICD-10-CM | POA: Diagnosis present

## 2011-11-04 DIAGNOSIS — I2699 Other pulmonary embolism without acute cor pulmonale: Secondary | ICD-10-CM

## 2011-11-04 DIAGNOSIS — I428 Other cardiomyopathies: Secondary | ICD-10-CM

## 2011-11-04 DIAGNOSIS — I5022 Chronic systolic (congestive) heart failure: Secondary | ICD-10-CM

## 2011-11-04 DIAGNOSIS — M542 Cervicalgia: Secondary | ICD-10-CM | POA: Diagnosis present

## 2011-11-04 DIAGNOSIS — I2510 Atherosclerotic heart disease of native coronary artery without angina pectoris: Secondary | ICD-10-CM

## 2011-11-04 DIAGNOSIS — R0789 Other chest pain: Secondary | ICD-10-CM | POA: Diagnosis present

## 2011-11-04 DIAGNOSIS — J4489 Other specified chronic obstructive pulmonary disease: Secondary | ICD-10-CM

## 2011-11-04 DIAGNOSIS — E119 Type 2 diabetes mellitus without complications: Secondary | ICD-10-CM

## 2011-11-04 DIAGNOSIS — Z79899 Other long term (current) drug therapy: Secondary | ICD-10-CM

## 2011-11-04 DIAGNOSIS — R911 Solitary pulmonary nodule: Secondary | ICD-10-CM | POA: Diagnosis present

## 2011-11-04 DIAGNOSIS — Z7901 Long term (current) use of anticoagulants: Secondary | ICD-10-CM

## 2011-11-04 DIAGNOSIS — J449 Chronic obstructive pulmonary disease, unspecified: Secondary | ICD-10-CM | POA: Diagnosis present

## 2011-11-04 DIAGNOSIS — K5090 Crohn's disease, unspecified, without complications: Secondary | ICD-10-CM | POA: Diagnosis present

## 2011-11-04 DIAGNOSIS — R079 Chest pain, unspecified: Secondary | ICD-10-CM

## 2011-11-04 HISTORY — DX: Unspecified osteoarthritis, unspecified site: M19.90

## 2011-11-04 HISTORY — DX: Pneumonia, unspecified organism: J18.9

## 2011-11-04 HISTORY — DX: Cerebral infarction, unspecified: I63.9

## 2011-11-04 LAB — COMPREHENSIVE METABOLIC PANEL
ALT: 14 U/L (ref 0–53)
Alkaline Phosphatase: 109 U/L (ref 39–117)
CO2: 30 mEq/L (ref 19–32)
GFR calc Af Amer: 42 mL/min — ABNORMAL LOW (ref >90)
Glucose, Bld: 172 mg/dL — ABNORMAL HIGH (ref 70–99)
Potassium: 4.5 mEq/L (ref 3.5–5.1)
Sodium: 141 mEq/L (ref 135–145)
Total Protein: 7.6 g/dL (ref 6.0–8.3)

## 2011-11-04 LAB — POCT I-STAT TROPONIN I: Troponin i, poc: 0.01 ng/mL (ref 0.00–0.08)

## 2011-11-04 LAB — POCT I-STAT, CHEM 8
Calcium, Ion: 1.22 mmol/L (ref 1.13–1.30)
Chloride: 103 mEq/L (ref 96–112)
HCT: 50 % (ref 39.0–52.0)
Hemoglobin: 17 g/dL (ref 13.0–17.0)
TCO2: 28 mmol/L (ref 0–100)

## 2011-11-04 LAB — CBC
Hemoglobin: 15.9 g/dL (ref 13.0–17.0)
MCHC: 33.7 g/dL (ref 30.0–36.0)
RBC: 5.14 MIL/uL (ref 4.22–5.81)
WBC: 7.4 10*3/uL (ref 4.0–10.5)

## 2011-11-04 LAB — CK TOTAL AND CKMB (NOT AT ARMC)
CK, MB: 2.2 ng/mL (ref 0.3–4.0)
Total CK: 123 U/L (ref 7–232)

## 2011-11-04 NOTE — ED Notes (Signed)
Reported sitting at [facility] with some friends and began to have stomach pain. Reported being very diaphoretic and weak. No c/o chest pain. Difficult to identify pulses on EMS arrival. Patient remained alert during episode. Patient c/o abd pain on arrival to ED.

## 2011-11-05 ENCOUNTER — Emergency Department (HOSPITAL_COMMUNITY): Payer: Medicare Other

## 2011-11-05 ENCOUNTER — Encounter (HOSPITAL_COMMUNITY): Payer: Self-pay | Admitting: Internal Medicine

## 2011-11-05 DIAGNOSIS — K56609 Unspecified intestinal obstruction, unspecified as to partial versus complete obstruction: Secondary | ICD-10-CM

## 2011-11-05 DIAGNOSIS — I2699 Other pulmonary embolism without acute cor pulmonale: Secondary | ICD-10-CM

## 2011-11-05 DIAGNOSIS — I2510 Atherosclerotic heart disease of native coronary artery without angina pectoris: Secondary | ICD-10-CM

## 2011-11-05 DIAGNOSIS — R918 Other nonspecific abnormal finding of lung field: Secondary | ICD-10-CM

## 2011-11-05 DIAGNOSIS — J449 Chronic obstructive pulmonary disease, unspecified: Secondary | ICD-10-CM

## 2011-11-05 LAB — CARDIAC PANEL(CRET KIN+CKTOT+MB+TROPI)
CK, MB: 1.9 ng/mL (ref 0.3–4.0)
CK, MB: 1.4 ng/mL (ref 0.3–4.0)
Relative Index: 1.8 (ref 0.0–2.5)
Relative Index: INVALID (ref 0.0–2.5)
Total CK: 89 U/L (ref 7–232)
Total CK: 68 U/L (ref 7–232)
Troponin I: <0.3 ng/mL (ref <0.30)

## 2011-11-05 LAB — CBC
HCT: 44.2 % (ref 39.0–52.0)
Hemoglobin: 14.5 g/dL (ref 13.0–17.0)
MCHC: 32.8 g/dL (ref 30.0–36.0)
RDW: 13.6 % (ref 11.5–15.5)
WBC: 7.7 10*3/uL (ref 4.0–10.5)

## 2011-11-05 LAB — BASIC METABOLIC PANEL
BUN: 17 mg/dL (ref 6–23)
Chloride: 104 mEq/L (ref 96–112)
Creatinine, Ser: 1.4 mg/dL — ABNORMAL HIGH (ref 0.50–1.35)
GFR calc Af Amer: 56 mL/min — ABNORMAL LOW (ref >90)
GFR calc non Af Amer: 48 mL/min — ABNORMAL LOW (ref >90)
Potassium: 4.5 mEq/L (ref 3.5–5.1)

## 2011-11-05 LAB — TSH: TSH: 0.59 u[IU]/mL (ref 0.350–4.500)

## 2011-11-05 LAB — HEPARIN LEVEL (UNFRACTIONATED): Heparin Unfractionated: 0.28 IU/mL — ABNORMAL LOW (ref 0.30–0.70)

## 2011-11-05 MED ORDER — SODIUM CHLORIDE 0.9 % IV SOLN: INTRAVENOUS | Status: AC

## 2011-11-05 MED ORDER — HEPARIN (PORCINE) IN NACL 100-0.45 UNIT/ML-% IJ SOLN
1100.0000 [IU]/h | INTRAMUSCULAR | Status: DC
  Administered 2011-11-05: 1100 [IU]/h via INTRAVENOUS
  Administered 2011-11-05: 1000 [IU]/h via INTRAVENOUS
  Administered 2011-11-06: 1100 [IU]/h via INTRAVENOUS
  Filled 2011-11-05 (×4): qty 250

## 2011-11-05 MED ORDER — MORPHINE SULFATE 4 MG/ML IJ SOLN
4.0000 mg | INTRAMUSCULAR | Status: AC
  Administered 2011-11-05: 4 mg via INTRAVENOUS
  Filled 2011-11-05: qty 1

## 2011-11-05 MED ORDER — HYDROMORPHONE HCL PF 1 MG/ML IJ SOLN
0.5000 mg | INTRAMUSCULAR | Status: DC | PRN
  Administered 2011-11-05 – 2011-11-06 (×5): 0.5 mg via INTRAVENOUS
  Filled 2011-11-05 (×5): qty 1

## 2011-11-05 MED ORDER — ONDANSETRON HCL 4 MG/2ML IJ SOLN: 4.0000 mg | Freq: Four times a day (QID) | INTRAMUSCULAR | Status: DC | PRN

## 2011-11-05 MED ORDER — TUBERCULIN PPD 5 UNIT/0.1ML ID SOLN
5.0000 [IU] | Freq: Once | INTRADERMAL | Status: AC
  Administered 2011-11-05: 5 [IU] via INTRADERMAL
  Filled 2011-11-05 (×2): qty 0.1

## 2011-11-05 MED ORDER — SIMVASTATIN 10 MG PO TABS
10.0000 mg | ORAL_TABLET | Freq: Every day | ORAL | Status: DC
  Administered 2011-11-05 – 2011-11-07 (×3): 10 mg via ORAL
  Filled 2011-11-05 (×4): qty 1

## 2011-11-05 MED ORDER — IOHEXOL 350 MG/ML SOLN
80.0000 mL | Freq: Once | INTRAVENOUS | Status: AC | PRN
  Administered 2011-11-05: 80 mL via INTRAVENOUS

## 2011-11-05 MED ORDER — ONDANSETRON HCL 4 MG PO TABS: 4.0000 mg | ORAL_TABLET | Freq: Four times a day (QID) | ORAL | Status: DC | PRN

## 2011-11-05 MED ORDER — ONDANSETRON HCL 4 MG/2ML IJ SOLN
4.0000 mg | Freq: Once | INTRAMUSCULAR | Status: AC
  Administered 2011-11-05: 4 mg via INTRAVENOUS
  Filled 2011-11-05: qty 2

## 2011-11-05 MED ORDER — CARVEDILOL 3.125 MG PO TABS
3.1250 mg | ORAL_TABLET | Freq: Two times a day (BID) | ORAL | Status: DC
  Administered 2011-11-05 – 2011-11-08 (×7): 3.125 mg via ORAL
  Filled 2011-11-05 (×9): qty 1

## 2011-11-05 MED ORDER — DEXTROSE-NACL 5-0.9 % IV SOLN
INTRAVENOUS | Status: DC
  Administered 2011-11-05 – 2011-11-08 (×6): via INTRAVENOUS

## 2011-11-05 MED ORDER — ONDANSETRON HCL 4 MG/2ML IJ SOLN: 4.0000 mg | Freq: Three times a day (TID) | INTRAMUSCULAR | Status: DC | PRN

## 2011-11-05 MED ORDER — PNEUMOCOCCAL VAC POLYVALENT 25 MCG/0.5ML IJ INJ
0.5000 mL | INJECTION | INTRAMUSCULAR | Status: AC
  Filled 2011-11-05: qty 0.5

## 2011-11-05 NOTE — ED Notes (Signed)
Patient was taken to Ct via stretcher and RN. Patient remains on cardiac monitor and O2. No reports of chest pain. Continues to report abd pain 9/10.

## 2011-11-05 NOTE — Progress Notes (Signed)
ANTICOAGULATION CONSULT NOTE - Follow Up Consult  Pharmacy Consult for heparin Indication: hx of DVT/PE and CVA  No Known Allergies  Patient Measurements: Height: 5' 9"  (175.3 cm) Weight: 155 lb 6.8 oz (70.5 kg) IBW/kg (Calculated) : 70.7  Heparin Dosing Weight: 70.5 kg   Vital Signs: Temp: 97.6 F (36.4 C) (07/23 1745) Temp src: Oral (07/23 1745) BP: 109/56 mmHg (07/23 1745) Pulse Rate: 52  (07/23 1800)  Labs:  Basename 11/05/11 1739 11/05/11 1151 11/05/11 0530 11/04/11 2326 11/04/11 2304  HGB -- -- 14.5 17.0 --  HCT -- -- 44.2 50.0 47.2  PLT -- -- 173 -- 182  APTT -- -- -- -- 33  LABPROT -- -- -- -- 22.2*  INR -- -- -- -- 1.91*  HEPARINUNFRC 0.28* -- -- -- --  CREATININE -- -- 1.40* 1.80* 1.78*  CKTOTAL -- 107 89 -- 123  CKMB -- 1.9 1.9 -- 2.2  TROPONINI -- <0.30 <0.30 -- --    Estimated Creatinine Clearance: 46.9 ml/min (by C-G formula based on Cr of 1.4).   Medications:  Scheduled:    . sodium chloride   Intravenous STAT  . carvedilol  3.125 mg Oral BID WC  .  morphine injection  4 mg Intravenous STAT  . ondansetron (ZOFRAN) IV  4 mg Intravenous Once  . pneumococcal 23 valent vaccine  0.5 mL Intramuscular Tomorrow-1000  . simvastatin  10 mg Oral q1800  . tuberculin  5 Units Intradermal Once   Infusions:    . dextrose 5 % and 0.9% NaCl 75 mL/hr at 11/05/11 1738  . heparin 1,000 Units/hr (11/05/11 1500)    Assessment: 71 yo male with hx of DVT/PE and CVA is currently on subtherapeutic heparin. Heparin level 0.28 Goal of Therapy:  Heparin level 0.3-0.7 units/ml Monitor platelets by anticoagulation protocol: Yes   Plan:  1) Increase heparin to 1100 units/hr. 2) Check an 8 hour heparin level.  Taevon Aschoff, Tsz-Yin 11/05/2011,6:29 PM

## 2011-11-05 NOTE — Progress Notes (Signed)
Pt seen and examined, admitted this morning by Dr.Le Abd pain much better, no vomiting, no BM so far, denies chest pain 1. PSBO, in past felt to be from thickening prox to prior ileo-colic anastomosis, clinically improving, continue supportive care, NPO, IVF today, KUB in am, if does not continue to improve will need surgical eval 2. 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 3. DVT/bilateral PE in 2007 and CVA: coumadin on hold, IV heparin while NPO and due to need for biopsy 4. Transient chest pain resolved, EKG w/ ST changes, unchanged from prior, cardiac enzymes x2, negative  Domenic Polite, MD [phone]

## 2011-11-05 NOTE — ED Provider Notes (Addendum)
History     CSN: [id number]  Arrival date & time 11/04/11  2249   First MD Initiated Contact with Patient 11/04/11 2249      Chief Complaint  Patient presents with  . Abdominal Pain    (Consider location/radiation/quality/duration/timing/severity/associated sxs/prior treatment) Patient is a 71 y.o. male presenting with abdominal pain. The history is provided by the patient.  Abdominal Pain The primary symptoms of the illness include abdominal pain. The current episode started less than 1 hour ago. The onset of the illness was sudden. The problem has not changed since onset. The abdominal pain began less than 1 hour ago. The pain came on suddenly. The abdominal pain has been unchanged since its onset. The abdominal pain is located in the epigastric region. The abdominal pain does not radiate. The severity of the abdominal pain is 3/10. The abdominal pain is relieved by nothing.  The patient has not had a change in bowel habit. Risk factors for an acute abdominal problem include being elderly. Additional symptoms associated with the illness include diaphoresis.    Past Medical History  Diagnosis Date  . Diabetes mellitus   . Hypertension   . Dilated cardiomyopathy     Last echo 2/12: EF 30-35%, trivial AI, mild RAE.  [name] Chronic systolic heart failure   . CAD (coronary artery disease)     LHC 9/05 with Dr. Einar Gip:  dLM 20-30%, LAD 85%, oD1 20-30%.  PCI:  Taxus DES to LAD; Dx jailed and tx with POBA.  Last myoview 12/10: inf scar, no ischemia, EF 29%.  . DVT (deep venous thrombosis)   . Pulmonary embolus     chronic coumadin  . Hyperhomocystinemia   . PFO (patent foramen ovale)   . HLD (hyperlipidemia)   . Crohn's disease   . History of stroke   . Nephrolithiasis     No past surgical history on file.  No family history on file.  History  Substance Use Topics  . Smoking status: Current Everyday Smoker  . Smokeless tobacco: Not on file  . Alcohol Use: No      Review of  Systems  Constitutional: Positive for diaphoresis.  Gastrointestinal: Positive for abdominal pain.  All other systems reviewed and are negative.    Allergies  Review of patient's allergies indicates no known allergies.  Home Medications   Current Outpatient Rx  Name Route Sig Dispense Refill  . CARVEDILOL 3.125 MG PO TABS Oral Take 1 tablet (3.125 mg total) by mouth 2 (two) times daily. 60 tablet 11  . PRAVASTATIN SODIUM 20 MG PO TABS Oral Take 1 tablet (20 mg total) by mouth every evening. 30 tablet 11  . WARFARIN SODIUM 5 MG PO TABS Oral Take 5-7.5 mg by mouth daily. Takes 1 tablet (89m) on Monday, Wednesday, Friday, Saturday, and Sunday; Takes 1.5 tablets (7.517m on Tuesday and Thursday    . LISINOPRIL 5 MG PO TABS Oral Take 0.5 tablets (2.5 mg total) by mouth daily. 30 tablet 6    BP 110/54  Pulse 55  Temp 97.8 F (36.6 C) (Oral)  Resp 18  Ht 5' 9"  (1.753 m)  Wt 158 lb (71.668 kg)  BMI 23.33 kg/m2  SpO2 100%  Physical Exam  Constitutional: He is oriented to person, place, and time. He appears well-developed and well-nourished.  HENT:  Head: Normocephalic and atraumatic.  Eyes: Conjunctivae are normal. Pupils are equal, round, and reactive to light.  Neck: Normal range of motion. Neck supple.  Cardiovascular: Normal rate, regular  rhythm, normal heart sounds and intact distal pulses.   Pulmonary/Chest: Effort normal and breath sounds normal.  Abdominal: Soft. Bowel sounds are normal. There is tenderness.       Epigastric tenderness  Neurological: He is alert and oriented to person, place, and time.  Skin: Skin is warm and dry.  Psychiatric: He has a normal mood and affect. His behavior is normal. Judgment and thought content normal.    ED Course  Procedures (including critical care time)  Labs Reviewed  COMPREHENSIVE METABOLIC PANEL - Abnormal; Notable for the following:    Glucose, Bld 172 (*)     Creatinine, Ser 1.78 (*)     Albumin 3.4 (*)     GFR calc non Af  Amer 36 (*)     GFR calc Af Amer 42 (*)     All other components within normal limits  PROTIME-INR - Abnormal; Notable for the following:    Prothrombin Time 22.2 (*)     INR 1.91 (*)     All other components within normal limits  POCT I-STAT, CHEM 8 - Abnormal; Notable for the following:    Creatinine, Ser 1.80 (*)     Glucose, Bld 167 (*)     All other components within normal limits  CBC  APTT  CK TOTAL AND CKMB  POCT I-STAT TROPONIN I   Dg Chest Port 1 View  11/04/2011  *RADIOLOGY REPORT*  Clinical Data: Chest pain.  PORTABLE CHEST - 1 VIEW  Comparison: 01/30/2011.  Findings: The cardiac silhouette, mediastinal and hilar contours are within normal limits and stable.  Slightly low lung volumes with mild vascular crowding and streaky areas of atelectasis.  No infiltrates, edema or effusions.  IMPRESSION: No acute cardiopulmonary findings.  Original Report Authenticated By: P. Kalman Jewels, M.D.     No diagnosis found. Date: 11/05/2011  Rate: 68  Rhythm: normal sinus rhythm  QRS Axis: left  Intervals: normal  ST/T Wave abnormalities: ST elevations anteriorly  Conduction Disutrbances:none  Narrative Interpretation:   Old EKG Reviewed: unchanged   MDM  stemi alert.  Seen by interventional cards,  Reviewed old ekg,  Declined to take to cath lab at this time.  Normotensive,   Less diaphoretic.  Await ct, reassess  Ce neg.  Ct ?  Lung ca vs mets.  sbo on ct abd.  Discussed with surgery.  Advised med admission.  Hospitalist to admit.        Jc Veron Ferne Reus, MD 11/05/11 0020  Aaron Edelman, MD 11/05/11 3343

## 2011-11-05 NOTE — H&P (Addendum)
[facility] History and Physical  Grant Lara GBT:[id number] DOB: April 30, 1940    PCP:   Elyn Peers, MD   Chief Complaint: abdominal pain.  HPI: Grant Lara is an 71 y.o. male with hx of PFO, prior CVA, PE,and DVT necessitating lifelong anticoagulation with supratherapeutic INR, Hx of crohn's disease, HTN, DM, Hyperhomocysteinemia, with recent admission for abdominal pain found to be secondary to narrowing and circumferential thickening proximal to the ileocolic anastomosis (Dr Paulita Fujita), presents again tonight to the ER complaining of abdominal pain.  Originally, he was under CODE STEMI because of EKG changes, but it was not a change from an old EKG.  Further evaluation in the ER with abdominal pelvic CT showed ? Early small bowel obtruction, and also borderline lymphadenopathy with possible pelvic mass as well.   He has no fever, chills, or diarrhea.  No black or bloody stool.  He has a normal WBC, unremarkable Hb, and slight elevated Cr to 1.8.  His chest CT angiogram showed no PE, no dissection, but he has increased soft tissue density and cavitary pulmonary lesions along with numerous small lesions worrisome for metastatic disease.  Hospitalist was asked to admit for early or partial bowel obtruction likely secondary to adhesions.  Rewiew of Systems:  Constitutional: Negative for malaise, fever and chills. No significant weight loss or weight gain Eyes: Negative for eye pain, redness and discharge, diplopia, visual changes, or flashes of light. ENMT: Negative for ear pain, hoarseness, nasal congestion, sinus pressure and sore throat. No headaches; tinnitus, drooling, or problem swallowing. Cardiovascular: Negative for chest pain, palpitations, diaphoresis, dyspnea and peripheral edema. ; No orthopnea, PND Respiratory: Negative for cough, hemoptysis, wheezing and stridor. No pleuritic chestpain. Gastrointestinal: Negative for nausea, vomiting, diarrhea, constipation, abdominal pain,  melena, blood in stool, hematemesis, jaundice and rectal bleeding.    Genitourinary: Negative for frequency, dysuria, incontinence,flank pain and hematuria; Musculoskeletal: Negative for back pain and neck pain. Negative for swelling and trauma.;  Skin: . Negative for pruritus, rash, abrasions, bruising and skin lesion.; ulcerations Neuro: Negative for headache, lightheadedness and neck stiffness. Negative for weakness, altered level of consciousness , altered mental status, extremity weakness, burning feet, involuntary movement, seizure and syncope.  Psych: negative for anxiety, depression, insomnia, tearfulness, panic attacks, hallucinations, paranoia, suicidal or homicidal ideation   Past Medical History  Diagnosis Date  . Diabetes mellitus   . Hypertension   . Dilated cardiomyopathy     Last echo 2/12: EF 30-35%, trivial AI, mild RAE.  [name] Chronic systolic heart failure   . CAD (coronary artery disease)     LHC 9/05 with Dr. Einar Gip:  dLM 20-30%, LAD 85%, oD1 20-30%.  PCI:  Taxus DES to LAD; Dx jailed and tx with POBA.  Last myoview 12/10: inf scar, no ischemia, EF 29%.  . DVT (deep venous thrombosis)   . Pulmonary embolus     chronic coumadin  . Hyperhomocystinemia   . PFO (patent foramen ovale)   . HLD (hyperlipidemia)   . Crohn's disease   . History of stroke   . Nephrolithiasis     No past surgical history on file.  Medications:  HOME MEDS: Prior to Admission medications   Medication Sig Start Date End Date Taking? Authorizing Provider  carvedilol (COREG) 3.125 MG tablet Take 1 tablet (3.125 mg total) by mouth 2 (two) times daily. 03/28/11 03/27/12 Yes Liliane Shi, PA  pravastatin (PRAVACHOL) 20 MG tablet Take 1 tablet (20 mg total) by mouth every evening. 03/28/11 03/27/12 Yes  Liliane Shi, PA  warfarin (COUMADIN) 5 MG tablet Take 5-7.5 mg by mouth daily. Takes 1 tablet (13m) on Monday, Wednesday, Friday, Saturday, and Sunday; Takes 1.5 tablets (7.56m on Tuesday and  Thursday   Yes Historical Provider, MD  lisinopril (PRINIVIL,ZESTRIL) 5 MG tablet Take 0.5 tablets (2.5 mg total) by mouth daily. 08/24/10 08/24/11  JaMinus BreedingMD     Allergies:  No Known Allergies  Social History:   reports that he has been smoking.  He does not have any smokeless tobacco history on file. He reports that he does not drink alcohol. His drug history not on file.  Family History: No family history on file.   Physical Exam: Filed Vitals:   11/05/11 0030 11/05/11 0232 11/05/11 0420 11/05/11 0449  BP: 114/63 115/71 102/67 103/59  Pulse: 64 77  68  Temp:    97.8 F (36.6 C)  TempSrc:    Oral  Resp: 11 14  16   Height:   5' 9"  (1.753 m) 5' 9"  (1.753 m)  Weight:   70.5 kg (155 lb 6.8 oz) 70.5 kg (155 lb 6.8 oz)  SpO2: 100% 100% 100% 97%   Blood pressure 103/59, pulse 68, temperature 97.8 F (36.6 C), temperature source Oral, resp. rate 16, height 5' 9"  (1.753 m), weight 70.5 kg (155 lb 6.8 oz), SpO2 97.00%.  GEN:  Pleasant  patient lying in the stretcher in no acute distress; cooperative with exam. PSYCH:  alert and oriented x4; does not appear anxious or depressed; affect is appropriate. HEENT: Mucous membranes pink and anicteric; PERRLA; EOM intact; no cervical lymphadenopathy nor thyromegaly or carotid bruit; no JVD; There were no stridor. Neck is very supple. Breasts:: Not examined CHEST WALL: No tenderness CHEST: Normal respiration, clear to auscultation bilaterally.  HEART: Regular rate and rhythm.  There are no murmur, rub, or gallops.   BACK: No kyphosis or scoliosis; no CVA tenderness ABDOMEN: soft and non-tender; no masses, no organomegaly, high pitched bowel sounds; no pannus; no intertriginous candida. There is no rebound and no distention. Rectal Exam: Not done EXTREMITIES: No bone or joint deformity; age-appropriate arthropathy of the hands and knees; no edema; no ulcerations.  There is no calf tenderness. Genitalia: not examined PULSES: 2+ and  symmetric SKIN: Normal hydration no rash or ulceration CNS: Cranial nerves 2-12 grossly intact no focal lateralizing neurologic deficit.  Speech is fluent; uvula elevated with phonation, facial symmetry and tongue midline. DTR are normal bilaterally, cerebella exam is intact, barbinski is negative and strengths are equaled bilaterally.  No sensory loss.   Labs on Admission:  Basic Metabolic Panel:  Lab [id number] 11/04/11 2304  NA 140 141  K 4.5 4.5  CL 103 102  CO2 -- 30  GLUCOSE 167* 172*  BUN 18 17  CREATININE 1.80* 1.78*  CALCIUM -- 9.5  MG -- --  PHOS -- --   Liver Function Tests:  Lab 11/04/11 2304  AST 18  ALT 14  ALKPHOS 109  BILITOT 0.6  PROT 7.6  ALBUMIN 3.4*   No results found for this basename: LIPASE:5,AMYLASE:5 in the last 168 hours No results found for this basename: AMMONIA:5 in the last 168 hours CBC:  Lab 11/04/11 2326 11/04/11 2304  WBC -- 7.4  NEUTROABS -- --  HGB 17.0 15.9  HCT 50.0 47.2  MCV -- 91.8  PLT -- 182   Cardiac Enzymes:  Lab 11/04/11 2304  CKTOTAL 123  CKMB 2.2  CKMBINDEX --  TROPONINI --  CBG: No results found for this basename: GLUCAP:5 in the last 168 hours   Radiological Exams on Admission: Ct Angio Chest W/cm &/or Wo Cm  11/05/2011  *RADIOLOGY REPORT*  Clinical Data:  Chest and abdominal pain.  CT ANGIOGRAPHY CHEST, ABDOMEN AND PELVIS  Technique:  Multidetector CT imaging through the chest, abdomen and pelvis was performed using the standard protocol during bolus administration of intravenous contrast.  Multiplanar reconstructed images including MIPs were obtained and reviewed to evaluate the vascular anatomy.  Contrast: 75m OMNIPAQUE IOHEXOL 350 MG/ML SOLN  Comparison:   Chest CT 02/18/2010.  CTA CHEST  Findings:  The chest wall is unremarkable.  No supraclavicular or axillary lymphadenopathy.  A small right thyroid nodule is noted. This is stable. The bony thorax is intact.  The heart is normal in size.  No  pericardial effusion.  No mediastinal or hilar lymphadenopathy.  Small scattered lymph nodes are noted.  The aorta is normal in caliber.  No dissection.  Three- vessel coronary artery calcifications are noted.  The esophagus is grossly normal.  The pulmonary arterial tree is well opacified.  No filling defects to suggest pulmonary emboli.  Examination of the lung parenchyma demonstrates a right apical cavitary lung lesion. This was present on the prior chest CT but there is new soft tissue nodularity associated with the lesion which is somewhat worrisome.  There are also multiple small bilateral pulmonary nodules some which are cavitary and worrisome for metastasis.  PET CT recommended for further evaluation.   Review of the MIP images confirms the above findings.  IMPRESSION:  1.  No CT findings for pulmonary embolism. 2.  Normal thoracic aorta. 3.  Three-vessel coronary artery calcifications. 4.  Increasing soft tissue nodularity associated with the right apical cavitary lung lesion with numerous small bilateral pulmonary nodules worrisome for metastatic disease.  PET CT recommended for further evaluation.  CTA ABDOMEN AND PELVIS  Findings:  The abdominal aorta demonstrates moderate atherosclerotic calcifications but no focal aneurysm or dissection. Moderate atherosclerotic change involving the iliac arteries and branch vessels. The major branch vessels are patient's.  The liver is unremarkable.  No focal lesions or biliary dilatation. The gallbladder is normal.  No common bile duct dilatation.  The pancreas is unremarkable.  The spleen is normal in size.  No focal lesions.  The adrenal glands are normal.  There are multiple bilateral renal cysts.  No worrisome renal lesions or hydronephrosis.  The stomach, duodenum, small bowel and colon grossly normal without oral contrast.  There is moderate stool in the colon which may suggest constipation.  Surgical changes from prior bowel surgery are noted.  The patient  appears to have a prior right colectomy with a small bowel transverse colon anastomoses.  The colon is slightly dilated near the anastomoses and there are dilated small bowel loops in the upper and mid pelvis with a transition to normal/decompressed small bowel loops which may suggest an early or partial small bowel obstruction due to adhesions. There are borderline enlarged mesenteric nodes in the right abdomen and in the right pelvis there are either matted small bowel loops or possible pelvic mass just above the bladder.  A CT abdomen/pelvis with oral contrast may be helpful for further evaluation.  The bladder demonstrates mild diffuse wall thickening but no focal mass.  The prostate gland and seminal vesicles are grossly normal. No pelvic mass or lymphadenopathy.  The bony structures are intact.   Review of the MIP images confirms the above findings.  IMPRESSION:  1.  Moderate atherosclerotic changes involving the aorta and branch vessels but no focal aneurysm or dissection. 2.  Evidence of prior bowel surgery as discussed above.  Findings suspicious for an early or partial small bowel obstruction, likely due to adhesions in the upper pelvic area. 3.  Borderline enlarged mesenteric lymph nodes and possible pelvic mass.  CT abdomen/pelvis with  oral contrast may be helpful.  Original Report Authenticated By: P. Kalman Jewels, M.D.   Dg Chest Port 1 View  11/04/2011  *RADIOLOGY REPORT*  Clinical Data: Chest pain.  PORTABLE CHEST - 1 VIEW  Comparison: 01/30/2011.  Findings: The cardiac silhouette, mediastinal and hilar contours are within normal limits and stable.  Slightly low lung volumes with mild vascular crowding and streaky areas of atelectasis.  No infiltrates, edema or effusions.  IMPRESSION: No acute cardiopulmonary findings.  Original Report Authenticated By: P. Kalman Jewels, M.D.   Ct Cta Abd/pel W/cm &/or W/o Cm  11/05/2011  *RADIOLOGY REPORT*  Clinical Data:  Chest and abdominal pain.  CT  ANGIOGRAPHY CHEST, ABDOMEN AND PELVIS  Technique:  Multidetector CT imaging through the chest, abdomen and pelvis was performed using the standard protocol during bolus administration of intravenous contrast.  Multiplanar reconstructed images including MIPs were obtained and reviewed to evaluate the vascular anatomy.  Contrast: 52m OMNIPAQUE IOHEXOL 350 MG/ML SOLN  Comparison:   Chest CT 02/18/2010.  CTA CHEST  Findings:  The chest wall is unremarkable.  No supraclavicular or axillary lymphadenopathy.  A small right thyroid nodule is noted. This is stable. The bony thorax is intact.  The heart is normal in size.  No pericardial effusion.  No mediastinal or hilar lymphadenopathy.  Small scattered lymph nodes are noted.  The aorta is normal in caliber.  No dissection.  Three- vessel coronary artery calcifications are noted.  The esophagus is grossly normal.  The pulmonary arterial tree is well opacified.  No filling defects to suggest pulmonary emboli.  Examination of the lung parenchyma demonstrates a right apical cavitary lung lesion. This was present on the prior chest CT but there is new soft tissue nodularity associated with the lesion which is somewhat worrisome.  There are also multiple small bilateral pulmonary nodules some which are cavitary and worrisome for metastasis.  PET CT recommended for further evaluation.   Review of the MIP images confirms the above findings.  IMPRESSION:  1.  No CT findings for pulmonary embolism. 2.  Normal thoracic aorta. 3.  Three-vessel coronary artery calcifications. 4.  Increasing soft tissue nodularity associated with the right apical cavitary lung lesion with numerous small bilateral pulmonary nodules worrisome for metastatic disease.  PET CT recommended for further evaluation.  CTA ABDOMEN AND PELVIS  Findings:  The abdominal aorta demonstrates moderate atherosclerotic calcifications but no focal aneurysm or dissection. Moderate atherosclerotic change involving the iliac  arteries and branch vessels. The major branch vessels are patient's.  The liver is unremarkable.  No focal lesions or biliary dilatation. The gallbladder is normal.  No common bile duct dilatation.  The pancreas is unremarkable.  The spleen is normal in size.  No focal lesions.  The adrenal glands are normal.  There are multiple bilateral renal cysts.  No worrisome renal lesions or hydronephrosis.  The stomach, duodenum, small bowel and colon grossly normal without oral contrast.  There is moderate stool in the colon which may suggest constipation.  Surgical changes from prior bowel surgery are noted.  The patient appears to have a prior right colectomy  with a small bowel transverse colon anastomoses.  The colon is slightly dilated near the anastomoses and there are dilated small bowel loops in the upper and mid pelvis with a transition to normal/decompressed small bowel loops which may suggest an early or partial small bowel obstruction due to adhesions. There are borderline enlarged mesenteric nodes in the right abdomen and in the right pelvis there are either matted small bowel loops or possible pelvic mass just above the bladder.  A CT abdomen/pelvis with oral contrast may be helpful for further evaluation.  The bladder demonstrates mild diffuse wall thickening but no focal mass.  The prostate gland and seminal vesicles are grossly normal. No pelvic mass or lymphadenopathy.  The bony structures are intact.   Review of the MIP images confirms the above findings.  IMPRESSION:  1.  Moderate atherosclerotic changes involving the aorta and branch vessels but no focal aneurysm or dissection. 2.  Evidence of prior bowel surgery as discussed above.  Findings suspicious for an early or partial small bowel obstruction, likely due to adhesions in the upper pelvic area. 3.  Borderline enlarged mesenteric lymph nodes and possible pelvic mass.  CT abdomen/pelvis with  oral contrast may be helpful.  Original Report  Authenticated By: P. Kalman Jewels, M.D.    Assessment/Plan Present on Admission:  .SBO (small bowel obstruction) .DM .TOBACCO ABUSE .CARDIOMYOPATHY .C O P D .Pulmonary embolism .CVA (cerebral infarction) .DVT (deep venous thrombosis)   PLAN:  For his SBO and hx of Crohn's disease, will place on NPO and give IVF.  He has no diarrhea and no evidence of infection, so will hold off on steroids and/or antibiotics.  Hopefully, he will have resolution of this spontaneously.  I am concerned about the CT of the chest finding, and will go ahead and get a PET scan as recommended by radiology.  He also has a ? Pelvic mass and will need to have further work up when his obstruction is better.  Because he potentially would need surgery, and because his INR is suprathx, I will d/c his coumadin.  Please resume it as soon as possible if he doesn't need Sx, as he really need lifelong anticoagulation.  His other problems are stable and his meds will be continued.  He is stable, full code and will be admitted to [facility] service.  Other plans as per orders.  Code Status: FULL.   Orvan Falconer, MD. [facility] Pager [phone] 7pm to 7am.  11/05/2011, 5:16 AM    NOTE:  I have not ordered a PET scan as it can be done as outpatient.

## 2011-11-05 NOTE — ED Notes (Signed)
Sleeping, NAD, calm, resps e/u, VSS/WNL, NSR on monitor.

## 2011-11-05 NOTE — ED Notes (Signed)
Family is at bedside. No acute distress.

## 2011-11-05 NOTE — Consult Note (Signed)
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   Initially due to EKG changes he was treated as a CODE STEMI but was not a change from old EKGs. Work up in [place] demonstrated nml WBC, mild elevation of sr cr (1.8 / on ACE) and abd/pelvis CT with ? early small bowel obtruction, and also borderline lymphadenopathy with possible pelvic mass as well.  CTA of chest was negative for PE but had noted increased soft tissue density and cavitary pulmonary lesions along with numerous small lesions worrisome for metastatic disease.  PCCM consulted for pulmonary evaluation.      Tests / Events: 7/22 CTA Chest>>>No CT findings for pulmonary embolism.  Normal thoracic aorta. Three-vessel coronary artery calcifications. Increasing soft tissue nodularity associated with the right apical cavitary lung lesion with numerous small bilateral pulmonary nodules worrisome for metastatic disease. PET CT recommended for further evaluation.  7/22 CT Abd/Pelvis>>>Moderate atherosclerotic changes involving the aorta and branch vessels but no focal aneurysm or dissection.  Evidence of prior bowel surgery as discussed above. Findings suspicious for an early or partial small bowel obstruction, likely due to adhesions in the upper pelvic area.  Borderline enlarged mesenteric lymph nodes and possible pelvic  mass.    Past Medical History  Diagnosis Date  . Diabetes mellitus   . Hypertension   . Dilated cardiomyopathy     Last echo 2/12: EF 30-35%, trivial AI, mild RAE.  [name] Chronic systolic heart failure   . CAD (coronary artery  disease)     LHC 9/05 with Dr. Einar Gip:  dLM 20-30%, LAD 85%, oD1 20-30%.  PCI:  Taxus DES to LAD; Dx jailed and tx with POBA.  Last myoview 12/10: inf scar, no ischemia, EF 29%.  . DVT (deep venous thrombosis)   . Pulmonary embolus     chronic coumadin  . Hyperhomocystinemia   . PFO (patent foramen ovale)   . HLD (hyperlipidemia)   . Crohn's disease   . History of stroke   . Nephrolithiasis     No past surgical history on file.  Prior to Admission medications   Medication Sig Start Date End Date Taking? Authorizing Provider  carvedilol (COREG) 3.125 MG tablet Take 1 tablet (3.125 mg total) by mouth 2 (two) times daily. 03/28/11 03/27/12 Yes Liliane Shi, PA  pravastatin (PRAVACHOL) 20 MG tablet Take 1 tablet (20 mg total) by mouth every evening. 03/28/11 03/27/12 Yes Liliane Shi, PA  warfarin (COUMADIN) 5 MG tablet Take 5-7.5 mg by mouth daily. Takes 1 tablet (25m) on Monday, Wednesday, Friday, Saturday, and Sunday; Takes 1.5 tablets (7.521m on Tuesday and Thursday   Yes Historical Provider, MD  lisinopril (PRINIVIL,ZESTRIL) 5 MG tablet Take 0.5 tablets (2.5 mg total) by mouth daily. 08/24/10 08/24/11  JaMinus BreedingMD    Allergies No Known Allergies  Family History No family history on file.  Social History  reports that he has been smoking.  He does not have any smokeless tobacco history on file. He reports that he does not drink alcohol. His drug history not on file.  Review Of Systems:  Gen: Denies fever, chills, weight change, fatigue, night sweats HEENT: Denies blurred vision, double vision, hearing loss, tinnitus, sinus congestion, rhinorrhea, sore throat, neck stiffness, dysphagia PULM: Denies shortness of breath, cough, sputum production, hemoptysis, wheezing CV: Denies chest pain, edema, orthopnea, paroxysmal nocturnal dyspnea, palpitations GI: Denies nausea, vomiting, diarrhea, hematochezia, melena, constipation, change in bowel habits.  Indicates abd pain.  GU:  Denies dysuria, hematuria, polyuria, oliguria, urethral discharge Endocrine: Denies hot or cold intolerance, polyuria, polyphagia or appetite change Derm: Denies rash, dry skin, scaling or peeling skin change Heme: Denies easy bruising, bleeding, bleeding gums Neuro: Denies headache, numbness, weakness, slurred speech, loss of memory or consciousness  Vital Signs: Temp:  [97.5 F (36.4 C)-97.8 F (36.6 C)] 97.7 F (36.5 C) (07/23 1006) Pulse Rate:  [54-77] 60  (07/23 1006) Resp:  [11-20] 18  (07/23 1006) BP: (101-121)/(50-71) 101/50 mmHg (07/23 1006) SpO2:  [97 %-100 %] 100 % (07/23 1006) FiO2 (%):  [99 %] 99 % (07/22 2258) Weight:  [154 lb 5.2 oz (70 kg)-158 lb (71.668 kg)] 155 lb 6.8 oz (70.5 kg) (07/23 0449)    Physical Examination: General: thin adult male in NAD Neuro: AAOx4, speech clear CV: s1s2 rrr, no m/r/g PULM: resp's even/non-labored, lungs bilaterally clear GI: round /soft, bsx4 active Extremities: warm/dry, no edema   Labs    CBC  Lab 11/05/11 0530 11/04/11 2326 11/04/11 2304  HGB 14.5 17.0 15.9  HCT 44.2 50.0 47.2  WBC 7.7 -- 7.4  PLT 173 -- 182     BMET  Lab 11/05/11 0530 11/04/11 2326 11/04/11 2304  NA 140 140 141  K 4.5 4.5 --  CL 104 103 102  CO2 24 -- 30  GLUCOSE 146* 167* 172*  BUN 17 18 17   CREATININE 1.40* 1.80* 1.78*  CALCIUM 8.9 -- 9.5  MG -- -- --  PHOS -- -- --     Lab 11/04/11 2304  INR 1.91*     Lab 11/04/11 2326  PHART --  PCO2ART --  PO2ART --  HCO3 --  TCO2 28  O2SAT --     Radiology: See above   Assessment and Plan: Principal Problem:  *SBO (small bowel obstruction) Active Problems:  DM  TOBACCO ABUSE  CARDIOMYOPATHY  C O P D  Pulmonary embolism  CVA (cerebral infarction)  DVT (deep venous thrombosis)  Right apical cavitary lesion / numerous small bilateral pulmonary nodules Assessment:  CTA neg for PE.  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  R apical changes have been present since 2011.  Not  amenable to FOB.  Multiple nodules noted but some may be small airways and blood vessel branches.  No evidence of acute infection but could be old granulomatous disease.  Doubt bronchogenic carcinoma.  No indication for PET or FOB at this time. Will need follow up CT of Chest in 3 months for review of RUL lesion.   Plan: -plan for CT in 3 months to eval.  Office will call him with appt in 3 months and with CT time (arranged).   -place PPD --NO NEED for respiratory isolation! -abdominal work up for ? Mass per primary svc.    Noe Gens, NP-C [facility] Pgr: [phone]  11/05/2011, 11:16 AM    Pulm Attending: The R apical changes have been present and are minimally changed since 11/11.  I am not impressed with the multiple pulmonary nodules. Bronchoscopy would be low diagnostic yield. I don't a PET scan is helpful here  in differentiated malignancy from inflammation and the "nodules" are too small - i.e beyond the resolution of PET. Plan re: chest CT findings are outlined above  Merton Border, MD ; [facility] service Mobile [phone].  After 5:30 PM or weekends, call [phone]

## 2011-11-05 NOTE — Progress Notes (Signed)
Patient evaluated for long-term disease management services with [facility] as a benefit of his [employer]. Patient will receive a post discharge transition of care call and potentially receive monthly home visits for assessments and for education if needed. Left information at bedside and spoke with Mr Vespa briefly to explain services. He was sleeping. Will follow up at later time.  Marthenia Rolling, MSN- Ed, RN,BSN [facility] Liaison [phone]

## 2011-11-05 NOTE — ED Notes (Signed)
Patient is awaiting consulting physician evaluation. C/o severe abd pain. EDP was made aware.

## 2011-11-05 NOTE — Progress Notes (Signed)
1300 TB skin test applied to right forearm. Results to be read in 48 hours.

## 2011-11-05 NOTE — Progress Notes (Signed)
ANTICOAGULATION CONSULT NOTE - Initial Consult  Pharmacy Consult for heparin Indication: h/o dvt/pe, cva - holding coumadin for biopsy  No Known Allergies  Patient Measurements: Height: 5' 9"  (175.3 cm) Weight: 155 lb 6.8 oz (70.5 kg) IBW/kg (Calculated) : 70.7  Heparin Dosing Weight: 70.5 kg  Vital Signs: Temp: 97.8 F (36.6 C) (07/23 0449) Temp src: Oral (07/23 0449) BP: 103/59 mmHg (07/23 0449) Pulse Rate: 68  (07/23 0449)  Labs:  Basename 11/05/11 0530 11/04/11 2326 11/04/11 2304  HGB 14.5 17.0 --  HCT 44.2 50.0 47.2  PLT 173 -- 182  APTT -- -- 33  LABPROT -- -- 22.2*  INR -- -- 1.91*  HEPARINUNFRC -- -- --  CREATININE 1.40* 1.80* 1.78*  CKTOTAL 89 -- 123  CKMB 1.9 -- 2.2  TROPONINI <0.30 -- --    Estimated Creatinine Clearance: 46.9 ml/min (by C-G formula based on Cr of 1.4).   Medical History: Past Medical History  Diagnosis Date  . Diabetes mellitus   . Hypertension   . Dilated cardiomyopathy     Last echo 2/12: EF 30-35%, trivial AI, mild RAE.  [name] Chronic systolic heart failure   . CAD (coronary artery disease)     LHC 9/05 with Dr. Einar Gip:  dLM 20-30%, LAD 85%, oD1 20-30%.  PCI:  Taxus DES to LAD; Dx jailed and tx with POBA.  Last myoview 12/10: inf scar, no ischemia, EF 29%.  . DVT (deep venous thrombosis)   . Pulmonary embolus     chronic coumadin  . Hyperhomocystinemia   . PFO (patent foramen ovale)   . HLD (hyperlipidemia)   . Crohn's disease   . History of stroke   . Nephrolithiasis     Medications:  Prescriptions prior to admission  Medication Sig Dispense Refill  . carvedilol (COREG) 3.125 MG tablet Take 1 tablet (3.125 mg total) by mouth 2 (two) times daily.  60 tablet  11  . pravastatin (PRAVACHOL) 20 MG tablet Take 1 tablet (20 mg total) by mouth every evening.  30 tablet  11  . warfarin (COUMADIN) 5 MG tablet Take 5-7.5 mg by mouth daily. Takes 1 tablet (64m) on Monday, Wednesday, Friday, Saturday, and Sunday; Takes 1.5 tablets  (7.529m on Tuesday and Thursday      . lisinopril (PRINIVIL,ZESTRIL) 5 MG tablet Take 0.5 tablets (2.5 mg total) by mouth daily.  30 tablet  6    Assessment: [age]o M on chronic coumadin PTA for h/o CVA, PE, DVT also with h/o Crohn's disease, found to have SBO which is clinically improving as well as R apical cavitary lung lesion with plans for biopsy.  Pharmacy consulted to start heparin while coumadin is held for biopsy.  Goal of Therapy:  Heparin level 0.3-0.7 units/ml Monitor platelets by anticoagulation protocol: Yes   Plan:  - Initiate Heparin drip at 1000 units/hr (NO bolus 2/2 INR of 1.9 last PM)  - Check 8h heparin level - Check daily heparin level and CBC  Annalee Meyerhoff L. GrAmada JupiterPharmD, BCEpeslinical Pharmacist Pager: [phone]: [phone]/23/2013 9:38 AM

## 2011-11-05 NOTE — Progress Notes (Signed)
Utilization review complete 

## 2011-11-06 ENCOUNTER — Inpatient Hospital Stay (HOSPITAL_COMMUNITY): Payer: Medicare Other

## 2011-11-06 DIAGNOSIS — I82409 Acute embolism and thrombosis of unspecified deep veins of unspecified lower extremity: Secondary | ICD-10-CM

## 2011-11-06 DIAGNOSIS — M79609 Pain in unspecified limb: Secondary | ICD-10-CM

## 2011-11-06 DIAGNOSIS — M79672 Pain in left foot: Secondary | ICD-10-CM | POA: Diagnosis present

## 2011-11-06 LAB — BASIC METABOLIC PANEL
Chloride: 105 mEq/L (ref 96–112)
GFR calc Af Amer: 71 mL/min — ABNORMAL LOW (ref >90)
GFR calc non Af Amer: 61 mL/min — ABNORMAL LOW (ref >90)
Glucose, Bld: 139 mg/dL — ABNORMAL HIGH (ref 70–99)
Potassium: 4 mEq/L (ref 3.5–5.1)
Sodium: 138 mEq/L (ref 135–145)

## 2011-11-06 LAB — CBC
HCT: 41 % (ref 39.0–52.0)
Hemoglobin: 13.5 g/dL (ref 13.0–17.0)
RBC: 4.46 MIL/uL (ref 4.22–5.81)

## 2011-11-06 LAB — PROTIME-INR
Prothrombin Time: 27.2 seconds — ABNORMAL HIGH (ref 11.6–15.2)
Prothrombin Time: 24.9 seconds — ABNORMAL HIGH (ref 11.6–15.2)

## 2011-11-06 LAB — HEPARIN LEVEL (UNFRACTIONATED)
Heparin Unfractionated: 1.01 IU/mL — ABNORMAL HIGH (ref 0.30–0.70)
Heparin Unfractionated: 0.92 IU/mL — ABNORMAL HIGH (ref 0.30–0.70)

## 2011-11-06 MED ORDER — WARFARIN - PHARMACIST DOSING INPATIENT
Freq: Every day | Status: DC
  Administered 2011-11-06: 1

## 2011-11-06 MED ORDER — GABAPENTIN 300 MG PO CAPS
300.0000 mg | ORAL_CAPSULE | Freq: Three times a day (TID) | ORAL | Status: DC
  Administered 2011-11-06 – 2011-11-08 (×6): 300 mg via ORAL
  Filled 2011-11-06 (×8): qty 1

## 2011-11-06 MED ORDER — ACETAMINOPHEN 650 MG RE SUPP
650.0000 mg | Freq: Four times a day (QID) | RECTAL | Status: DC | PRN
  Administered 2011-11-06 (×2): 650 mg via RECTAL
  Filled 2011-11-06 (×2): qty 1

## 2011-11-06 MED ORDER — GABAPENTIN 600 MG PO TABS
300.0000 mg | ORAL_TABLET | Freq: Three times a day (TID) | ORAL | Status: DC
  Filled 2011-11-06 (×2): qty 0.5

## 2011-11-06 MED ORDER — WARFARIN SODIUM 5 MG PO TABS
5.0000 mg | ORAL_TABLET | Freq: Once | ORAL | Status: AC
  Administered 2011-11-06: 5 mg via ORAL
  Filled 2011-11-06 (×2): qty 1

## 2011-11-06 NOTE — Progress Notes (Signed)
Notified NP on call of pt complaints of leg cramping, stiffness, and heaviness. Received new order for tylenol suppository.

## 2011-11-06 NOTE — Progress Notes (Signed)
ANTICOAGULATION CONSULT NOTE - Follow Up Consult  Pharmacy Consult for Heparin + Warfarin Indication: Hx DVT/PE, CVA  No Known Allergies  Patient Measurements: Height: 5' 9"  (175.3 cm) Weight: 155 lb 6.8 oz (70.5 kg) IBW/kg (Calculated) : 70.7  Heparin Dosing Weight: 70.5  Vital Signs: Temp: 98.1 F (36.7 C) (07/24 1300) BP: 109/63 mmHg (07/24 1300) Pulse Rate: 50  (07/24 1300)  Labs:  Basename 11/06/11 1458 11/06/11 1300 11/06/11 0305 11/05/11 2000 11/05/11 1151 11/05/11 0530 11/04/11 2326 11/04/11 2304  HGB -- -- 13.5 -- -- 14.5 -- --  HCT -- -- 41.0 -- -- 44.2 50.0 --  PLT -- -- 153 -- -- 173 -- 182  APTT -- -- -- -- -- -- -- 33  LABPROT 24.9* 27.2* -- -- -- -- -- 22.2*  INR 2.21* 2.47* -- -- -- -- -- 1.91*  HEPARINUNFRC 0.92* 1.01* 0.49 -- -- -- -- --  CREATININE -- -- 1.15 -- -- 1.40* 1.80* --  CKTOTAL -- -- -- 68 107 89 -- --  CKMB -- -- -- 1.4 1.9 1.9 -- --  TROPONINI -- -- -- <0.30 <0.30 <0.30 -- --    Estimated Creatinine Clearance: 57 ml/min (by C-G formula based on Cr of 1.15).   Medications:  Infusions:    . dextrose 5 % and 0.9% NaCl 75 mL/hr at 11/06/11 0531  . DISCONTD: heparin 1,100 Units/hr (11/06/11 0737)    Assessment: 71 y.o M on chronic coumadin PTA for h/o CVA, PE, DVT also with h/o Crohn's disease, found to have SBO which is clinically improving as well as R apical cavitary lung lesion. Pharmacy consulted to start heparin while coumadin is held for biopsy. No indication for biopsy at this time so MD has asked pharmacy to resume coumadin.   This afternoon the patient has a SUPRAtherapeutic heparin level (1.01) and therapeutic INR (2.47) despite his last dose of warfarin being on 7/22. The first heparin level was deemed to be drawn inappropriately as it was drawn from the same arm the heparin was infusing. The heparin level and INR were repeated, the heparin level remained SUPRAtherapeutic (HL 0.92) and INR remained therapeutic (INR 2.21).  This  was discussed with Dr. Reece Levy and it was decided to discontinue heparin and continue with only warfarin at this time. PTA the patient was known to be taking 5 mg daily EXCEPT for 7.5 mg on Tues/Thurs. Will only give 5 mg today given trend in INR since his dose on 7/22.   Goal of Therapy:  INR 2-3   Plan:  1. D/c heparin  2. Warfarin 5 mg x 1 dose at 1800 today 3. Will continue to monitor for any signs/symptoms of bleeding and will follow up with PT/INR in the a.m.   Alycia Rossetti, PharmD, BCPS Clinical Pharmacist Pager: [phone] 11/06/2011 5:09 PM

## 2011-11-06 NOTE — Progress Notes (Signed)
ANTICOAGULATION CONSULT NOTE - Initial Consult  Pharmacy Consult for heparin, Coumadin Indication: h/o dvt/pe, cva  No Known Allergies  Patient Measurements: Height: 5' 9"  (175.3 cm) Weight: 155 lb 6.8 oz (70.5 kg) IBW/kg (Calculated) : 70.7  Heparin Dosing Weight: 70.5 kg  Vital Signs: Temp: 98.1 F (36.7 C) (07/24 1300) BP: 109/63 mmHg (07/24 1300) Pulse Rate: 50  (07/24 1300)  Labs:  Basename 11/06/11 1300 11/06/11 0305 11/05/11 2000 11/05/11 1739 11/05/11 1151 11/05/11 0530 11/04/11 2326 11/04/11 2304  HGB -- 13.5 -- -- -- 14.5 -- --  HCT -- 41.0 -- -- -- 44.2 50.0 --  PLT -- 153 -- -- -- 173 -- 182  APTT -- -- -- -- -- -- -- 33  LABPROT 27.2* -- -- -- -- -- -- 22.2*  INR 2.47* -- -- -- -- -- -- 1.91*  HEPARINUNFRC 1.01* 0.49 -- 0.28* -- -- -- --  CREATININE -- 1.15 -- -- -- 1.40* 1.80* --  CKTOTAL -- -- 68 -- 107 89 -- --  CKMB -- -- 1.4 -- 1.9 1.9 -- --  TROPONINI -- -- <0.30 -- <0.30 <0.30 -- --    Estimated Creatinine Clearance: 57 ml/min (by C-G formula based on Cr of 1.15).   Medical History: Past Medical History  Diagnosis Date  . Hypertension   . Dilated cardiomyopathy     Last echo 2/12: EF 30-35%, trivial AI, mild RAE.  [name] Chronic systolic heart failure   . CAD (coronary artery disease)     LHC 9/05 with Dr. Einar Gip:  dLM 20-30%, LAD 85%, oD1 20-30%.  PCI:  Taxus DES to LAD; Dx jailed and tx with POBA.  Last myoview 12/10: inf scar, no ischemia, EF 29%.  . DVT (deep venous thrombosis)   . Pulmonary embolus     chronic coumadin  . Hyperhomocystinemia   . PFO (patent foramen ovale)   . HLD (hyperlipidemia)   . Crohn's disease   . Nephrolithiasis   . Pneumonia ~ 2011  . Diabetes mellitus     11/05/11 "borderline; don't take medications"  . Stroke 1993    "left arm can't hold steady; leg too"  . Arthritis     "used to have a touch in my legs"    Medications:  Prescriptions prior to admission  Medication Sig Dispense Refill  . carvedilol (COREG)  3.125 MG tablet Take 1 tablet (3.125 mg total) by mouth 2 (two) times daily.  60 tablet  11  . pravastatin (PRAVACHOL) 20 MG tablet Take 1 tablet (20 mg total) by mouth every evening.  30 tablet  11  . warfarin (COUMADIN) 5 MG tablet Take 5-7.5 mg by mouth daily. Takes 1 tablet (6m) on Monday, Wednesday, Friday, Saturday, and Sunday; Takes 1.5 tablets (7.559m on Tuesday and Thursday      . lisinopril (PRINIVIL,ZESTRIL) 5 MG tablet Take 0.5 tablets (2.5 mg total) by mouth daily.  30 tablet  6    Assessment: [age]o M on chronic coumadin PTA for h/o CVA, PE, DVT also with h/o Crohn's disease, found to have SBO which is clinically improving as well as R apical cavitary lung lesion.  Pharmacy consulted to start heparin while coumadin is held for biopsy.  No indication for biopsy at this time so MD has asked pharmacy to resume Coumadin.  Note, recent supratherapeutic heparin level drawn inappropriately from L arm directly above from where heparin is infusing per RN and patient.  Heparin level at goal this am.  Goal of Therapy:  INR 2-3 Heparin level 0.3-0.7 units/ml Monitor platelets by anticoagulation protocol: Yes   Plan:  -Repeat STAT INR, heparin level -Redose accordingly  Bryson Ha L. Amada Jupiter, PharmD, [facility] Clinical Pharmacist Pager: [phone] Pharmacy: [phone] 11/06/2011 2:22 PM

## 2011-11-06 NOTE — Progress Notes (Signed)
Subjective: Reports that his abdomen feels better.  He is complaining of leg pain bilaterally, reports he has had this for a few months now.  Objective: Vital signs in last 24 hours: Filed Vitals:   11/05/11 1745 11/05/11 1800 11/05/11 2123 11/06/11 0621  BP: 109/56  107/56 114/61  Pulse: 48 52 54 68  Temp: 97.6 F (36.4 C)  97.5 F (36.4 C) 97.6 F (36.4 C)  TempSrc: Oral  Oral   Resp: 16  18 17   Height:      Weight:      SpO2: 100%  98% 100%   Weight change:   Intake/Output Summary (Last 24 hours) at 11/06/11 1258 Last data filed at 11/06/11 0622  Gross per 24 hour  Intake 2458.27 ml  Output    600 ml  Net 1858.27 ml    Physical Exam: General: Awake, Oriented, No acute distress. HEENT: EOMI. Neck: Supple CV: S1 and S2 Lungs: Clear to ascultation bilaterally Abdomen: Soft, Nontender, Nondistended, +bowel sounds. Ext: Good pulses. Trace edema.  Good dorsi and plantar flexion.  Good knee flexion and extension.  No erythema noted in the legs bilaterally.  No focal joint tenderness.  Lab Results: Basic Metabolic Panel:  Lab 81/19/14 0305 11/05/11 0530 11/04/11 2326 11/04/11 2304  NA 138 140 140 141  K 4.0 4.5 4.5 4.5  CL 105 104 103 102  CO2 24 24 -- 30  GLUCOSE 139* 146* 167* 172*  BUN 14 17 18 17   CREATININE 1.15 1.40* 1.80* 1.78*  CALCIUM 8.4 8.9 -- 9.5  MG -- -- -- --  PHOS -- -- -- --   Liver Function Tests:  Lab 11/04/11 2304  AST 18  ALT 14  ALKPHOS 109  BILITOT 0.6  PROT 7.6  ALBUMIN 3.4*   No results found for this basename: LIPASE:5,AMYLASE:5 in the last 168 hours No results found for this basename: AMMONIA:5 in the last 168 hours CBC:  Lab 11/06/11 0305 11/05/11 0530 11/04/11 2326 11/04/11 2304  WBC 7.4 7.7 -- 7.4  NEUTROABS -- -- -- --  HGB 13.5 14.5 17.0 15.9  HCT 41.0 44.2 50.0 47.2  MCV 91.9 91.7 -- 91.8  PLT 153 173 -- 182   Cardiac Enzymes:  Lab 11/05/11 2000 11/05/11 1151 11/05/11 0530 11/04/11 2304  CKTOTAL 68 107 89 123    CKMB 1.4 1.9 1.9 2.2  CKMBINDEX -- -- -- --  TROPONINI <0.30 <0.30 <0.30 --   BNP (last 3 results)  Basename 01/31/11 0655  PROBNP 275.0*   CBG: No results found for this basename: GLUCAP:5 in the last 168 hours No results found for this basename: HGBA1C:5 in the last 72 hours Other Labs: No components found with this basename: POCBNP:3 No results found for this basename: DDIMER:2 in the last 168 hours No results found for this basename: CHOL:2,HDL:2,LDLCALC:2,TRIG:2,CHOLHDL:2,LDLDIRECT:2 in the last 168 hours  Lab 11/05/11 0530  TSH 0.590  T4TOTAL --  T3FREE --  FREET4 --  THYROIDAB --   No results found for this basename: VITAMINB12:2,FOLATE:2,FERRITIN:2,TIBC:2,IRON:2,RETICCTPCT:2 in the last 168 hours  Micro Results: No results found for this or any previous visit (from the past 240 hour(s)).  Studies/Results: Dg Abd 1 View  11/06/2011  *RADIOLOGY REPORT*  Clinical Data: Partial small bowel obstruction.  ABDOMEN - 1 VIEW  Comparison: CT on 11/05/2011  Findings: Gas and stool seen throughout nondilated left colon.  No gas-containing dilated small bowel loops are seen.  Mild contrast is noted within the gallbladder likely from recent CT.  IMPRESSION: Unremarkable bowel gas pattern.  No gas-containing dilated small bowel loops.  Original Report Authenticated By: Marlaine Hind, M.D.   Ct Angio Chest W/cm &/or Wo Cm  11/05/2011  *RADIOLOGY REPORT*  Clinical Data:  Chest and abdominal pain.  CT ANGIOGRAPHY CHEST, ABDOMEN AND PELVIS  Technique:  Multidetector CT imaging through the chest, abdomen and pelvis was performed using the standard protocol during bolus administration of intravenous contrast.  Multiplanar reconstructed images including MIPs were obtained and reviewed to evaluate the vascular anatomy.  Contrast: 83m OMNIPAQUE IOHEXOL 350 MG/ML SOLN  Comparison:   Chest CT 02/18/2010.  CTA CHEST  Findings:  The chest wall is unremarkable.  No supraclavicular or axillary  lymphadenopathy.  A small right thyroid nodule is noted. This is stable. The bony thorax is intact.  The heart is normal in size.  No pericardial effusion.  No mediastinal or hilar lymphadenopathy.  Small scattered lymph nodes are noted.  The aorta is normal in caliber.  No dissection.  Three- vessel coronary artery calcifications are noted.  The esophagus is grossly normal.  The pulmonary arterial tree is well opacified.  No filling defects to suggest pulmonary emboli.  Examination of the lung parenchyma demonstrates a right apical cavitary lung lesion. This was present on the prior chest CT but there is new soft tissue nodularity associated with the lesion which is somewhat worrisome.  There are also multiple small bilateral pulmonary nodules some which are cavitary and worrisome for metastasis.  PET CT recommended for further evaluation.   Review of the MIP images confirms the above findings.  IMPRESSION:  1.  No CT findings for pulmonary embolism. 2.  Normal thoracic aorta. 3.  Three-vessel coronary artery calcifications. 4.  Increasing soft tissue nodularity associated with the right apical cavitary lung lesion with numerous small bilateral pulmonary nodules worrisome for metastatic disease.  PET CT recommended for further evaluation.  CTA ABDOMEN AND PELVIS  Findings:  The abdominal aorta demonstrates moderate atherosclerotic calcifications but no focal aneurysm or dissection. Moderate atherosclerotic change involving the iliac arteries and branch vessels. The major branch vessels are patient's.  The liver is unremarkable.  No focal lesions or biliary dilatation. The gallbladder is normal.  No common bile duct dilatation.  The pancreas is unremarkable.  The spleen is normal in size.  No focal lesions.  The adrenal glands are normal.  There are multiple bilateral renal cysts.  No worrisome renal lesions or hydronephrosis.  The stomach, duodenum, small bowel and colon grossly normal without oral contrast.  There  is moderate stool in the colon which may suggest constipation.  Surgical changes from prior bowel surgery are noted.  The patient appears to have a prior right colectomy with a small bowel transverse colon anastomoses.  The colon is slightly dilated near the anastomoses and there are dilated small bowel loops in the upper and mid pelvis with a transition to normal/decompressed small bowel loops which may suggest an early or partial small bowel obstruction due to adhesions. There are borderline enlarged mesenteric nodes in the right abdomen and in the right pelvis there are either matted small bowel loops or possible pelvic mass just above the bladder.  A CT abdomen/pelvis with oral contrast may be helpful for further evaluation.  The bladder demonstrates mild diffuse wall thickening but no focal mass.  The prostate gland and seminal vesicles are grossly normal. No pelvic mass or lymphadenopathy.  The bony structures are intact.   Review of the MIP images confirms  the above findings.  IMPRESSION:  1.  Moderate atherosclerotic changes involving the aorta and branch vessels but no focal aneurysm or dissection. 2.  Evidence of prior bowel surgery as discussed above.  Findings suspicious for an early or partial small bowel obstruction, likely due to adhesions in the upper pelvic area. 3.  Borderline enlarged mesenteric lymph nodes and possible pelvic mass.  CT abdomen/pelvis with  oral contrast may be helpful.  Original Report Authenticated By: P. Kalman Jewels, M.D.   Dg Chest Port 1 View  11/04/2011  *RADIOLOGY REPORT*  Clinical Data: Chest pain.  PORTABLE CHEST - 1 VIEW  Comparison: 01/30/2011.  Findings: The cardiac silhouette, mediastinal and hilar contours are within normal limits and stable.  Slightly low lung volumes with mild vascular crowding and streaky areas of atelectasis.  No infiltrates, edema or effusions.  IMPRESSION: No acute cardiopulmonary findings.  Original Report Authenticated By: P. Kalman Jewels, M.D.   Ct Cta Abd/pel W/cm &/or W/o Cm  11/05/2011  *RADIOLOGY REPORT*  Clinical Data:  Chest and abdominal pain.  CT ANGIOGRAPHY CHEST, ABDOMEN AND PELVIS  Technique:  Multidetector CT imaging through the chest, abdomen and pelvis was performed using the standard protocol during bolus administration of intravenous contrast.  Multiplanar reconstructed images including MIPs were obtained and reviewed to evaluate the vascular anatomy.  Contrast: 51m OMNIPAQUE IOHEXOL 350 MG/ML SOLN  Comparison:   Chest CT 02/18/2010.  CTA CHEST  Findings:  The chest wall is unremarkable.  No supraclavicular or axillary lymphadenopathy.  A small right thyroid nodule is noted. This is stable. The bony thorax is intact.  The heart is normal in size.  No pericardial effusion.  No mediastinal or hilar lymphadenopathy.  Small scattered lymph nodes are noted.  The aorta is normal in caliber.  No dissection.  Three- vessel coronary artery calcifications are noted.  The esophagus is grossly normal.  The pulmonary arterial tree is well opacified.  No filling defects to suggest pulmonary emboli.  Examination of the lung parenchyma demonstrates a right apical cavitary lung lesion. This was present on the prior chest CT but there is new soft tissue nodularity associated with the lesion which is somewhat worrisome.  There are also multiple small bilateral pulmonary nodules some which are cavitary and worrisome for metastasis.  PET CT recommended for further evaluation.   Review of the MIP images confirms the above findings.  IMPRESSION:  1.  No CT findings for pulmonary embolism. 2.  Normal thoracic aorta. 3.  Three-vessel coronary artery calcifications. 4.  Increasing soft tissue nodularity associated with the right apical cavitary lung lesion with numerous small bilateral pulmonary nodules worrisome for metastatic disease.  PET CT recommended for further evaluation.  CTA ABDOMEN AND PELVIS  Findings:  The abdominal aorta  demonstrates moderate atherosclerotic calcifications but no focal aneurysm or dissection. Moderate atherosclerotic change involving the iliac arteries and branch vessels. The major branch vessels are patient's.  The liver is unremarkable.  No focal lesions or biliary dilatation. The gallbladder is normal.  No common bile duct dilatation.  The pancreas is unremarkable.  The spleen is normal in size.  No focal lesions.  The adrenal glands are normal.  There are multiple bilateral renal cysts.  No worrisome renal lesions or hydronephrosis.  The stomach, duodenum, small bowel and colon grossly normal without oral contrast.  There is moderate stool in the colon which may suggest constipation.  Surgical changes from prior bowel surgery are noted.  The patient appears to have  a prior right colectomy with a small bowel transverse colon anastomoses.  The colon is slightly dilated near the anastomoses and there are dilated small bowel loops in the upper and mid pelvis with a transition to normal/decompressed small bowel loops which may suggest an early or partial small bowel obstruction due to adhesions. There are borderline enlarged mesenteric nodes in the right abdomen and in the right pelvis there are either matted small bowel loops or possible pelvic mass just above the bladder.  A CT abdomen/pelvis with oral contrast may be helpful for further evaluation.  The bladder demonstrates mild diffuse wall thickening but no focal mass.  The prostate gland and seminal vesicles are grossly normal. No pelvic mass or lymphadenopathy.  The bony structures are intact.   Review of the MIP images confirms the above findings.  IMPRESSION:  1.  Moderate atherosclerotic changes involving the aorta and branch vessels but no focal aneurysm or dissection. 2.  Evidence of prior bowel surgery as discussed above.  Findings suspicious for an early or partial small bowel obstruction, likely due to adhesions in the upper pelvic area. 3.  Borderline  enlarged mesenteric lymph nodes and possible pelvic mass.  CT abdomen/pelvis with  oral contrast may be helpful.  Original Report Authenticated By: P. Kalman Jewels, M.D.    Medications: I have reviewed the patient's current medications. Scheduled Meds:   . sodium chloride   Intravenous STAT  . carvedilol  3.125 mg Oral BID WC  . gabapentin  300 mg Oral TID  . pneumococcal 23 valent vaccine  0.5 mL Intramuscular Tomorrow-1000  . simvastatin  10 mg Oral q1800  . tuberculin  5 Units Intradermal Once   Continuous Infusions:   . dextrose 5 % and 0.9% NaCl 75 mL/hr at 11/06/11 0531  . heparin 1,100 Units/hr (11/06/11 0737)   PRN Meds:.acetaminophen, HYDROmorphone (DILAUDID) injection, ondansetron (ZOFRAN) IV, ondansetron  Assessment/Plan: Partial small bowel obstruction. Improved.  Abdominal x-ray today shows unremarkable bowel gas pattern.  Start patient on clear liquid diet and advance as tolerated.  Continue supportive care with IV fluids.  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  Pulmonary consultation input appreciated.  Pulmonary planning on CT in 3 months, pulmonary will arrange for an office visit in 3 months and CT.  Consider PET scan as outpatient.   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  Discontinue IV heparin.  INR therapeutic.  Transient chest pain Resolved, EKG w/ ST changes, unchanged from prior, cardiac enzymes x2 negative.  Bilateral leg pain Etiology unclear.  No efficacy in performing lower extremity venous Dopplers as the patient is already on anticoagulation.  Will send for arterial Dopplers and ABIs to evaluate for circulation.  Will send for vitamin B 12 and folate.  Start patient on gabapentin to treat for any neuropathic pain.  Will request PT consultation.  COPD Stable.  Prophylaxis Therapeutic INR, Coumadin.  Disposition Pending.   LOS: 2 days  Meyli Boice A, MD 11/06/2011, 12:58 PM

## 2011-11-06 NOTE — Progress Notes (Signed)
ANTICOAGULATION CONSULT NOTE - Follow Up Consult  Pharmacy Consult for heparin Indication: h/o PE/DVT  Labs:  Basename 11/06/11 0305 11/05/11 2000 11/05/11 1739 11/05/11 1151 11/05/11 0530 11/04/11 2326 11/04/11 2304  HGB 13.5 -- -- -- 14.5 -- --  HCT 41.0 -- -- -- 44.2 50.0 --  PLT 153 -- -- -- 173 -- 182  APTT -- -- -- -- -- -- 33  LABPROT -- -- -- -- -- -- 22.2*  INR -- -- -- -- -- -- 1.91*  HEPARINUNFRC 0.49 -- 0.28* -- -- -- --  CREATININE -- -- -- -- 1.40* 1.80* 1.78*  CKTOTAL -- 68 -- 107 89 -- --  CKMB -- 1.4 -- 1.9 1.9 -- --  TROPONINI -- <0.30 -- <0.30 <0.30 -- --    Assessment/Plan:  71yo male now therapeutic on heparin after rate increase.  Coumadin on hold.  Will continue gtt at current rate and confirm stable with additional level.  Rogue Bussing PharmD BCPS 11/06/2011,4:03 AM

## 2011-11-06 NOTE — Progress Notes (Signed)
Physical Therapy Evaluation Patient Details Name: STEPFON Lara MRN: [id number] DOB: 01/20/1941 Today's Date: 11/06/2011 Time: 5397-6734 PT Time Calculation (min): 28 min  PT Assessment / Plan / Recommendation Clinical Impression  Pt is 71 yo male with SBO who also has severe, unexplained bilateral LE pain that persists at rest and worsens with WB'ing activity.  Legs are tender to the touch and painful with all keg motions.  Pain is significantly limiting functional mobility.  Recommend PT to advance mobility as tolerated and pt will likely need f/u PT, ie HHPT.  Recommend RW for d.c home as well.    PT Assessment  Patient needs continued PT services    Follow Up Recommendations  Home health PT    Barriers to Discharge None      Equipment Recommendations  Rolling walker with 5" wheels    Recommendations for Other Services OT consult   Frequency Min 3X/week    Precautions / Restrictions Precautions Precautions: Fall Precaution Comments: pt denies falls at home but is a fall risk given pain and "heavy feeling" in legs Restrictions Weight Bearing Restrictions: No   Pertinent Vitals/Pain 8/10 bilateral leg pain in standing      Mobility  Bed Mobility Bed Mobility: Supine to Sit Supine to Sit: With rails;5: Supervision Details for Bed Mobility Assistance: all mvmts slow and labored with obvious pain Transfers Transfers: Sit to Stand;Stand to Sit Sit to Stand: From bed;3: Mod assist;With upper extremity assist Stand to Sit: 4: Min assist;To chair/3-in-1;With upper extremity assist Details for Transfer Assistance: vc's for hand placement, pt had great difficulty transferring wt to feet. Performed 2x, was slightly better the second time as pt could anticipate what it would feel like. Ambulation/Gait Ambulation/Gait Assistance: 4: Min assist Ambulation Distance (Feet): 3 Feet Assistive device: Rolling walker Ambulation/Gait Assistance Details: pt did not show and knee or hip  instability with ambulation, took significant wt through the arms on RW Gait Pattern: Step-to pattern;Shuffle;Trunk flexed Gait velocity: very slow Stairs: No Wheelchair Mobility Wheelchair Mobility: No    Exercises General Exercises - Lower Extremity Ankle Circles/Pumps: AROM;Both;20 reps;Seated   PT Diagnosis: Difficulty walking;Generalized weakness;Acute pain;Abnormality of gait  PT Problem List: Decreased strength;Decreased range of motion;Decreased activity tolerance;Decreased balance;Decreased mobility;Decreased knowledge of use of DME;Decreased knowledge of precautions;Pain PT Treatment Interventions: DME instruction;Stair training;Gait training;Functional mobility training;Therapeutic activities;Therapeutic exercise;Balance training;Patient/family education   PT Goals Acute Rehab PT Goals PT Goal Formulation: With patient Time For Goal Achievement: 11/20/11 Potential to Achieve Goals: Good Pt will go Supine/Side to Sit: with modified independence;with HOB 0 degrees PT Goal: Supine/Side to Sit - Progress: Goal set today Pt will go Sit to Supine/Side: with modified independence PT Goal: Sit to Supine/Side - Progress: Goal set today Pt will go Sit to Stand: with modified independence PT Goal: Sit to Stand - Progress: Goal set today Pt will go Stand to Sit: with modified independence PT Goal: Stand to Sit - Progress: Goal set today Pt will Ambulate: 51 - 150 feet;with modified independence;with least restrictive assistive device PT Goal: Ambulate - Progress: Goal set today Pt will Go Up / Down Stairs: 3-5 stairs;with rail(s);with supervision PT Goal: Up/Down Stairs - Progress: Goal set today Pt will Perform Home Exercise Program: with supervision, verbal cues required/provided PT Goal: Perform Home Exercise Program - Progress: Goal set today  Visit Information  Last PT Received On: 11/06/11 Assistance Needed: +1    Subjective Data  Subjective: I was going to an appt with  the doctor today  about the pain in my legs Patient Stated Goal: relief of pain and return home   Prior [name] With: Spouse Available Help at Discharge: Available PRN/intermittently;Family Type of Home: House Home Access: Stairs to enter Technical brewer of Steps: 3 Home Layout: One level Home Adaptive Equipment: Straight cane Additional Comments: pt has a cane but rarely used it, was independent PTA Prior Function Level of Independence: Independent Able to Take Stairs?: Yes Vocation: Retired Comments: Pt reports that bilateral leg pain began about 3 weeks ago and has become progressively worse.  He reports a feeling of heaviness in the legs as well as pain that persists at rest but worsens with activity. He mentions cramping in the feet with ambulation. Legs are tender to the touch.  No gross swelling or erythema noted. Pt mentions that he had some sort of arthritis flare in his 30s that affected his legs and caused significant swelling, but it improved and has not returned since.  Pt denies h/o gout but reports that his father had it. Communication Communication: No difficulties    Cognition  Overall Cognitive Status: Appears within functional limits for tasks assessed/performed Arousal/Alertness: Awake/alert Orientation Level: Appears intact for tasks assessed Behavior During Session: [facility] for tasks performed    Extremity/Trunk Assessment Right Upper Extremity Assessment RUE ROM/Strength/Tone: WFL for tasks assessed RUE Sensation: WFL - Light Touch RUE Coordination: WFL - gross motor Left Upper Extremity Assessment LUE ROM/Strength/Tone: WFL for tasks assessed LUE Sensation: WFL - Light Touch LUE Coordination: WFL - gross motor Right Lower Extremity Assessment RLE ROM/Strength/Tone: Deficits;Due to pain RLE ROM/Strength/Tone Deficits: knee flexion 2+/5, hip flex 2+/5, hip ext 3/5, ankle df 3/5 RLE Sensation: Deficits RLE Sensation Deficits:  hypersensitivity RLE Coordination: WFL - gross motor Left Lower Extremity Assessment LLE ROM/Strength/Tone: Deficits;Due to pain LLE ROM/Strength/Tone Deficits: hip flex 2+/5, knee extension 3/5, hip extension 3/5, ankle df 3/5 LLE Sensation: Deficits LLE Sensation Deficits: hypersensitivity LLE Coordination: WFL - gross motor Trunk Assessment Trunk Assessment: Normal   Balance Balance Balance Assessed: Yes Static Standing Balance Static Standing - Balance Support: No upper extremity supported;During functional activity Static Standing - Level of Assistance: 4: Min assist Static Standing - Comment/# of Minutes: 5  End of Session PT - End of Session Equipment Utilized During Treatment: Gait belt Activity Tolerance: Patient limited by pain Patient left: in chair;with call bell/phone within reach Nurse Communication: Mobility status  GP   Leighton Roach, [place]  [place], [place] 11/06/2011, 2:56 PM

## 2011-11-07 LAB — CBC
MCH: 29.8 pg (ref 26.0–34.0)
MCHC: 32.7 g/dL (ref 30.0–36.0)
Platelets: 156 10*3/uL (ref 150–400)
RDW: 13.4 % (ref 11.5–15.5)

## 2011-11-07 LAB — BASIC METABOLIC PANEL
BUN: 16 mg/dL (ref 6–23)
Calcium: 8.5 mg/dL (ref 8.4–10.5)
Creatinine, Ser: 1.19 mg/dL (ref 0.50–1.35)
GFR calc non Af Amer: 59 mL/min — ABNORMAL LOW (ref >90)
Glucose, Bld: 128 mg/dL — ABNORMAL HIGH (ref 70–99)

## 2011-11-07 LAB — HEPARIN LEVEL (UNFRACTIONATED): Heparin Unfractionated: <0.1 IU/mL — ABNORMAL LOW (ref 0.30–0.70)

## 2011-11-07 LAB — PROTIME-INR
INR: 2.32 — ABNORMAL HIGH (ref 0.00–1.49)
Prothrombin Time: 25.9 seconds — ABNORMAL HIGH (ref 11.6–15.2)

## 2011-11-07 MED ORDER — WARFARIN SODIUM 7.5 MG PO TABS
7.5000 mg | ORAL_TABLET | ORAL | Status: DC
  Administered 2011-11-07: 7.5 mg via ORAL
  Filled 2011-11-07 (×2): qty 1

## 2011-11-07 MED ORDER — WARFARIN SODIUM 5 MG PO TABS
5.0000 mg | ORAL_TABLET | ORAL | Status: DC
  Filled 2011-11-07: qty 1

## 2011-11-07 MED ORDER — HYDROCODONE-ACETAMINOPHEN 5-325 MG PO TABS
1.0000 | ORAL_TABLET | Freq: Four times a day (QID) | ORAL | Status: DC | PRN
  Administered 2011-11-07 – 2011-11-08 (×4): 1 via ORAL
  Filled 2011-11-07 (×5): qty 1

## 2011-11-07 NOTE — Progress Notes (Signed)
Physical Therapy Treatment Patient Details Name: Grant Lara MRN: [id number] DOB: May 02, 1940 Today's Date: 11/07/2011 Time: 8295-6213 PT Time Calculation (min): 24 min  PT Assessment / Plan / Recommendation Comments on Treatment Session  Paitient able to ambulate more today. States that pain is still limiting mobility. Patient stated that wife cannot help at home. At this time patient needs 24 hour assistance, if not availible may require STSNF at discharge. MD and CM aware and patient agreeable    Follow Up Recommendations  Home health PT;Skilled nursing facility    Barriers to Discharge        Equipment Recommendations  Rolling walker with 5" wheels    Recommendations for Other Services    Frequency Min 3X/week   Plan Discharge plan needs to be updated;Frequency remains appropriate    Precautions / Restrictions Precautions Precautions: Fall   Pertinent Vitals/Pain     Mobility  Bed Mobility Supine to Sit: 5: Supervision;With rails Details for Bed Mobility Assistance: slow movements due to pain Transfers Sit to Stand: 4: Min assist;With upper extremity assist;From bed Stand to Sit: 4: Min assist;With upper extremity assist;To chair/3-in-1 Details for Transfer Assistance: A for balance and to stand fully upright. Cues for safe hand placement and not to pull up on RW. Patient required cues and assistance to control descent onto recliner.  Ambulation/Gait Ambulation/Gait Assistance: 4: Min assist Ambulation Distance (Feet): 60 Feet Assistive device: Rolling walker Ambulation/Gait Assistance Details: A for RW management and balance. Patient with increasing stride as ambulatoni increased. Cues for posture Gait Pattern: Step-through pattern;Decreased stride length;Trunk flexed    Exercises General Exercises - Lower Extremity Long Arc Quad: AROM;Both;10 reps   PT Diagnosis:    PT Problem List:   PT Treatment Interventions:     PT Goals Acute Rehab PT Goals PT Goal:  Supine/Side to Sit - Progress: Progressing toward goal PT Goal: Sit to Stand - Progress: Progressing toward goal PT Goal: Stand to Sit - Progress: Progressing toward goal PT Goal: Ambulate - Progress: Progressing toward goal PT Goal: Perform Home Exercise Program - Progress: Progressing toward goal  Visit Information  Last PT Received On: 11/07/11 Assistance Needed: +1    Subjective Data      Cognition  Overall Cognitive Status: Appears within functional limits for tasks assessed/performed Arousal/Alertness: Awake/alert Orientation Level: Appears intact for tasks assessed Behavior During Session: [facility] for tasks performed    Balance     End of Session PT - End of Session Equipment Utilized During Treatment: Gait belt Activity Tolerance: Patient tolerated treatment well;Patient limited by pain Patient left: in chair Nurse Communication: Mobility status   GP     Jacqualyn Posey 11/07/2011, 12:19 PM  11/07/2011 Jacqualyn Posey PTA [phone] pager [phone] office

## 2011-11-07 NOTE — Progress Notes (Signed)
Clinical Social Work  CSW met with patient and wife at bedside. CSW explained bed offers and patient and wife chose [place]. CSW contacted insurance who stated patient would be approved and would contact SNF with authorization number. Patient is ready to dc to SNF whenever medically ready. CSW will continue to follow.  [place], [place] [phone]

## 2011-11-07 NOTE — Progress Notes (Signed)
Agree with change in discharge plan  11/07/2011 Grant Lara DPT PAGER: [phone] OFFICE: [phone]

## 2011-11-07 NOTE — Progress Notes (Signed)
Clinical Social Work Department BRIEF PSYCHOSOCIAL ASSESSMENT 11/07/2011  Patient:  Grant Lara, Grant Lara     Account Number:  [id number]     Admit date:  11/04/2011  Clinical Social Worker:  Earlie Server  Date/Time:  11/07/2011 12:00 N  Referred by:  Care Management  Date Referred:  11/07/2011 Referred for  SNF Placement   Other Referral:   Interview type:  Patient Other interview type:   Wife    PSYCHOSOCIAL DATA Living Status:  FAMILY Admitted from facility:   Level of care:   Primary support name:  Grant Lara Primary support relationship to patient:  SPOUSE Degree of support available:   Strong    CURRENT CONCERNS Current Concerns  Post-Acute Placement   Other Concerns:    SOCIAL WORK ASSESSMENT / PLAN CSW received referral from CM stating that PT is now recommending SNF. CSW reviewed chart and met with patient at bedside. No visitors were present.    CSW introduced myself and explained role. CSW spoke with patient regarding PT recommendations for SNF. Patient reports that he lives with wife but when she is working he is at home alone. Patient reports he feels deconditioned and is agreeable to ST SNF. CSW provided patient with SNF list and encouraged patient to review list and have some options of places he would be willing to admit. CSW received permission to speak with patient's wife. CSW spoke with wife via phone who was agreeable to plan as well.    CSW completed FL2 and faxed information to [facility]. CSW submitted information to insurance for approval. CSW will follow up with bed offers.   Assessment/plan status:  Psychosocial Support/Ongoing Assessment of Needs Other assessment/ plan:   Information/referral to community resources:   SNF list    PATIENT'S/FAMILY'S RESPONSE TO PLAN OF CARE: Patient was alert and oriented. Patient was engaged and agreeable to wife being involved in care.

## 2011-11-07 NOTE — Progress Notes (Signed)
1330  TB skin test to right forearm found to be slightly red with a small raised area.  Skin test assessed to be negative.

## 2011-11-07 NOTE — Progress Notes (Addendum)
Clinical Social Work Department CLINICAL SOCIAL WORK PLACEMENT NOTE 11/07/2011  Patient:  Grant Lara, Grant Lara  Account Number:  [id number] Admit date:  11/04/2011  Clinical Social Worker:  Sindy Messing, LCSW  Date/time:  11/07/2011 12:30 PM  Clinical Social Work is seeking post-discharge placement for this patient at the following level of care:   [place]   (*CSW will update this form in Epic as items are completed)   11/07/2011  Patient/family provided with [place] Department of Clinical Social Work's list of facilities offering this level of care within the geographic area requested by the patient (or if unable, by the patient's family).  11/07/2011  Patient/family informed of their freedom to choose among providers that offer the needed level of care, that participate in Medicare, Medicaid or managed care program needed by the patient, have an available bed and are willing to accept the patient.  11/07/2011  Patient/family informed of [facility]' ownership interest in [facility], as well as of the fact that they are under no obligation to receive care at this facility.  PASARR submitted to EDS on 11/07/2011 PASARR number received from EDS on 11/07/2011  FL2 transmitted to all facilities in geographic area requested by pt/family on  11/07/2011 FL2 transmitted to all facilities within larger geographic area on   Patient informed that his/her managed care company has contracts with or will negotiate with  certain facilities, including the following:     Patient/family informed of bed offers received:  11/07/11 Patient chooses bed at [facility] Physician recommends and patient chooses bed at    Patient to be transferred to [facility] on  11/08/11 Patient to be transferred to facility by wife  The following physician request were entered in Epic:   Additional Comments:

## 2011-11-07 NOTE — Progress Notes (Signed)
ANTICOAGULATION CONSULT NOTE - Follow Up Consult  Pharmacy Consult for Warfarin Indication: Hx DVT/PE, CVA  No Known Allergies  Patient Measurements: Height: 5' 9"  (175.3 cm) Weight: 155 lb 6.8 oz (70.5 kg) IBW/kg (Calculated) : 70.7  Heparin Dosing Weight: 70.5  Vital Signs: Temp: 99.5 F (37.5 C) (07/25 0548) Temp src: Oral (07/25 0548) BP: 119/61 mmHg (07/25 0548) Pulse Rate: 65  (07/25 0548)  Labs:  Basename 11/07/11 0508 11/06/11 1458 11/06/11 1300 11/06/11 0305 11/05/11 2000 11/05/11 1151 11/05/11 0530 11/04/11 2304  HGB 12.8* -- -- 13.5 -- -- -- --  HCT 39.2 -- -- 41.0 -- -- 44.2 --  PLT 156 -- -- 153 -- -- 173 --  APTT -- -- -- -- -- -- -- 33  LABPROT 25.9* 24.9* 27.2* -- -- -- -- --  INR 2.32* 2.21* 2.47* -- -- -- -- --  HEPARINUNFRC <0.10* 0.92* 1.01* -- -- -- -- --  CREATININE 1.19 -- -- 1.15 -- -- 1.40* --  CKTOTAL -- -- -- -- 68 107 89 --  CKMB -- -- -- -- 1.4 1.9 1.9 --  TROPONINI -- -- -- -- <0.30 <0.30 <0.30 --    Estimated Creatinine Clearance: 55.1 ml/min (by C-G formula based on Cr of 1.19).   Assessment: 71 y.o M on chronic coumadin PTA for h/o CVA, PE, DVT also with h/o Crohn's disease, found to have SBO which is clinically improving as well as R apical cavitary lung lesion. Coumadin continues, noted IV heparin d/c 7/25 d/t therapeutic INR. INR remains therapeutic today. Noted H/H decreased slightly, Plts are WNL. Per RN report, patient is not bleeding.  PTA the patient was known to be taking Coumadin 5 mg daily EXCEPT for 7.5 mg on Tues/Thurs.   Goal of Therapy:  INR 2-3   Plan:  - Resume home Coumadin regimen - F/up INR daily for now  Shatora Weatherbee K. Posey Pronto, PharmD, BCPS.  Clinical Pharmacist Pager [phone]. 11/07/2011 10:50 AM

## 2011-11-07 NOTE — Evaluation (Signed)
Occupational Therapy Evaluation Patient Details Name: Grant Lara MRN: [id number] DOB: [birth year] Today's Date: 11/07/2011 Time: 1497-0263 OT Time Calculation (min): 39 min  OT Assessment / Plan / Recommendation Clinical Impression  Pleasant 71 yr old male admitted with recent history of bilateral leg pain.  Currently needs increased dependence with basic selfcare tasks and functional transfers compared to PLOF.  Will benefit from acute OT services to help pt be more independent.  Feel he will need short term rehab at SNF level secondary to wife not being able to provide 24 hour supervision.    OT Assessment  Patient needs continued OT Services    Follow Up Recommendations  Skilled nursing facility       Equipment Recommendations  Defer to next venue       Frequency  Min 2X/week    Precautions / Restrictions Precautions Precautions: Fall Restrictions Weight Bearing Restrictions: No   Pertinent Vitals/Pain Pain in bilateral LEs at 9/10, O2 sats 98% on room air with HR at 65 BPM    ADL  Eating/Feeding: Independent Where Assessed - Eating/Feeding: Chair Grooming: Performed;Min guard Where Assessed - Grooming: Supported standing Upper Body Bathing: Simulated;Set up Where Assessed - Upper Body Bathing: Unsupported sitting Lower Body Bathing: Simulated;Minimal assistance Where Assessed - Lower Body Bathing: Supported sit to stand Upper Body Dressing: Simulated;Set up Where Assessed - Upper Body Dressing: Unsupported sitting Lower Body Dressing: Simulated;Minimal assistance Where Assessed - Lower Body Dressing: Supported sit to stand Toilet Transfer: Performed;Minimal assistance Toilet Transfer Method: Other (comment) (Ambulate to the bathroom with RW.) Toilet Transfer Equipment: Comfort height toilet;Grab bars Toileting - Clothing Manipulation and Hygiene: Performed;Minimal assistance Where Assessed - Best boy and Hygiene: Sit to stand from 3-in-1 or  toilet Tub/Shower Transfer Method: Not assessed Transfers/Ambulation Related to ADLs: Pt min assist for mobility using the RW for support.  He takes relatively small steps as well. ADL Comments: Pt overall with difficulty reaching his LEs for donning gripper socks and performing LB selfcare.  Able to bend forward to reach feet somewhat but unable to cross them over his opposite knee secondary to increased leg pain.    OT Diagnosis: Generalized weakness;Acute pain  OT Problem List: Decreased strength;Impaired balance (sitting and/or standing);Decreased knowledge of use of DME or AE;Pain;Decreased activity tolerance OT Treatment Interventions: Self-care/ADL training;Therapeutic activities;DME and/or AE instruction;Balance training;Patient/family education   OT Goals Acute Rehab OT Goals OT Goal Formulation: With patient Time For Goal Achievement: 11/21/11 Potential to Achieve Goals: Good ADL Goals Pt Will Perform Grooming: with modified independence;Standing at sink ADL Goal: Grooming - Progress: Goal set today Pt Will Perform Lower Body Bathing: with supervision;Sit to stand from bed;Sit to stand from chair ADL Goal: Lower Body Bathing - Progress: Goal set today Pt Will Perform Lower Body Dressing: with supervision;Sit to stand from bed;Sit to stand from chair ADL Goal: Lower Body Dressing - Progress: Goal set today Pt Will Transfer to Toilet: with supervision;with DME;3-in-1 ADL Goal: Toilet Transfer - Progress: Goal set today Pt Will Perform Toileting - Clothing Manipulation: with modified independence ADL Goal: Toileting - Clothing Manipulation - Progress: Goal set today Pt Will Perform Toileting - Hygiene: Sit to stand from 3-in-1/toilet ADL Goal: Toileting - Hygiene - Progress: Goal set today  Visit Information  Last OT Received On: 11/07/11 Assistance Needed: +1    Subjective Data  Subjective: "My legs have been hurting to over a month now." Patient Stated Goal: Get his legs to  stop hurting so he can get  back to being independent.   Prior Functioning  Vision/Perception  Home Living Lives With: Spouse Available Help at Discharge: Available PRN/intermittently;Family Type of Home: House Home Access: Stairs to enter Technical brewer of Steps: 3 Home Layout: One level Bathroom Shower/Tub: Chiropodist: Standard Bathroom Accessibility: Yes Home Adaptive Equipment: Straight cane Additional Comments: pt has a cane but rarely used it, was independent PTA Prior Function Level of Independence: Independent Able to Take Stairs?: Yes Vocation: Retired Corporate investment banker: No difficulties Dominant Hand: Right   Vision - Assessment Eye Alignment: Within Functional Limits Vision Assessment: Vision not tested Perception Perception: Within Functional Limits Praxis Praxis: Intact  Cognition  Overall Cognitive Status: Appears within functional limits for tasks assessed/performed Arousal/Alertness: Awake/alert Orientation Level: Appears intact for tasks assessed Behavior During Session: [facility] for tasks performed    Extremity/Trunk Assessment Right Upper Extremity Assessment RUE ROM/Strength/Tone: Within functional levels RUE Sensation: WFL - Light Touch RUE Coordination: WFL - gross/fine motor Left Upper Extremity Assessment LUE ROM/Strength/Tone: Within functional levels LUE Sensation: WFL - Light Touch LUE Coordination: WFL - gross/fine motor Trunk Assessment Trunk Assessment:  (Pt stands with flexed posture durign RW use.)   Mobility Bed Mobility Bed Mobility: Sit to Supine Supine to Sit: 5: Supervision;With rails Sit to Supine: 5: Supervision;HOB flat Details for Bed Mobility Assistance: slow movements due to pain Transfers Transfers: Sit to Stand Sit to Stand: 4: Min assist;With upper extremity assist;With armrests;From chair/3-in-1 Stand to Sit: 4: Min assist;With upper extremity assist;To chair/3-in-1 Details for  Transfer Assistance: A for balance and to stand fully upright. Cues for safe hand placement and not to pull up on RW. Patient required cues and assistance to control descent onto recliner.       Balance Static Standing Balance Static Standing - Balance Support: Right upper extremity supported;Left upper extremity supported Static Standing - Level of Assistance: 4: Min assist  End of Session OT - End of Session Activity Tolerance: Patient limited by pain Patient left: in bed;with call bell/phone within reach;with family/visitor present;with bed alarm set      [facility] OTR/L Pager number [phone] 11/07/2011, 2:35 PM

## 2011-11-07 NOTE — Progress Notes (Signed)
Subjective: Abdomen feels better.  Still complaining of bilateral leg pain.  Objective: Vital signs in last 24 hours: Filed Vitals:   11/06/11 0621 11/06/11 1300 11/06/11 2122 11/07/11 0548  BP: 114/61 109/63 122/55 119/61  Pulse: 68 50 68 65  Temp: 97.6 F (36.4 C) 98.1 F (36.7 C) 98.2 F (36.8 C) 99.5 F (37.5 C)  TempSrc:   Oral Oral  Resp: 17 16 18 18   Height:      Weight:      SpO2: 100% 100% 96% 100%   Weight change:   Intake/Output Summary (Last 24 hours) at 11/07/11 1227 Last data filed at 11/07/11 1100  Gross per 24 hour  Intake   1940 ml  Output   1180 ml  Net    760 ml    Physical Exam: General: Awake, Oriented, No acute distress. HEENT: EOMI. Neck: Supple CV: S1 and S2 Lungs: Clear to ascultation bilaterally Abdomen: Soft, Nontender, Nondistended, +bowel sounds. Ext: Good pulses. Trace edema.  Lab Results: Basic Metabolic Panel:  Lab 94/70/96 0508 11/06/11 0305 11/05/11 0530 11/04/11 2326 11/04/11 2304  NA 135 138 140 140 141  K 3.9 4.0 4.5 4.5 4.5  CL 104 105 104 103 102  CO2 24 24 24  -- 30  GLUCOSE 128* 139* 146* 167* 172*  BUN 16 14 17 18 17   CREATININE 1.19 1.15 1.40* 1.80* 1.78*  CALCIUM 8.5 8.4 8.9 -- 9.5  MG 1.6 -- -- -- --  PHOS -- -- -- -- --   Liver Function Tests:  Lab 11/04/11 2304  AST 18  ALT 14  ALKPHOS 109  BILITOT 0.6  PROT 7.6  ALBUMIN 3.4*   No results found for this basename: LIPASE:5,AMYLASE:5 in the last 168 hours No results found for this basename: AMMONIA:5 in the last 168 hours CBC:  Lab 11/07/11 0508 11/06/11 0305 11/05/11 0530 11/04/11 2326 11/04/11 2304  WBC 6.7 7.4 7.7 -- 7.4  NEUTROABS -- -- -- -- --  HGB 12.8* 13.5 14.5 17.0 15.9  HCT 39.2 41.0 44.2 50.0 47.2  MCV 91.4 91.9 91.7 -- 91.8  PLT 156 153 173 -- 182   Cardiac Enzymes:  Lab 11/05/11 2000 11/05/11 1151 11/05/11 0530 11/04/11 2304  CKTOTAL 68 107 89 123  CKMB 1.4 1.9 1.9 2.2  CKMBINDEX -- -- -- --  TROPONINI <0.30 <0.30 <0.30 --    BNP (last 3 results)  Basename 01/31/11 0655  PROBNP 275.0*   CBG: No results found for this basename: GLUCAP:5 in the last 168 hours No results found for this basename: HGBA1C:5 in the last 72 hours Other Labs: No components found with this basename: POCBNP:3 No results found for this basename: DDIMER:2 in the last 168 hours No results found for this basename: CHOL:2,HDL:2,LDLCALC:2,TRIG:2,CHOLHDL:2,LDLDIRECT:2 in the last 168 hours  Lab 11/05/11 0530  TSH 0.590  T4TOTAL --  T3FREE --  FREET4 --  THYROIDAB --   No results found for this basename: VITAMINB12:2,FOLATE:2,FERRITIN:2,TIBC:2,IRON:2,RETICCTPCT:2 in the last 168 hours  Micro Results: No results found for this or any previous visit (from the past 240 hour(s)).  Studies/Results: Dg Abd 1 View  11/06/2011  *RADIOLOGY REPORT*  Clinical Data: Partial small bowel obstruction.  ABDOMEN - 1 VIEW  Comparison: CT on 11/05/2011  Findings: Gas and stool seen throughout nondilated left colon.  No gas-containing dilated small bowel loops are seen.  Mild contrast is noted within the gallbladder likely from recent CT.  IMPRESSION: Unremarkable bowel gas pattern.  No gas-containing dilated small bowel loops.  Original Report Authenticated By: Marlaine Hind, M.D.    Medications: I have reviewed the patient's current medications. Scheduled Meds:    . carvedilol  3.125 mg Oral BID WC  . gabapentin  300 mg Oral TID  . pneumococcal 23 valent vaccine  0.5 mL Intramuscular Tomorrow-1000  . simvastatin  10 mg Oral q1800  . warfarin  5 mg Oral ONCE-1800  . warfarin  5 mg Oral Custom  . warfarin  7.5 mg Oral Custom  . Warfarin - Pharmacist Dosing Inpatient   Does not apply q1800  . DISCONTD: gabapentin  300 mg Oral TID   Continuous Infusions:    . dextrose 5 % and 0.9% NaCl 75 mL/hr at 11/07/11 0633  . DISCONTD: heparin 1,100 Units/hr (11/06/11 0737)   PRN Meds:.acetaminophen, HYDROcodone-acetaminophen, HYDROmorphone (DILAUDID)  injection, ondansetron (ZOFRAN) IV, ondansetron  Assessment/Plan: Partial small bowel obstruction. Resolved.  Abdominal x-ray today shows unremarkable bowel gas pattern.  Diet advanced to low residue diet. Continue supportive care with IV fluids.  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  Pulmonary consultation input appreciated.  Pulmonary planning on CT in 3 months, pulmonary will arrange for an office visit in 3 months and CT.  Consider PET scan as outpatient.   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  INR therapeutic.  Transient chest pain Resolved, EKG w/ ST changes, unchanged from prior, cardiac enzymes x2 negative.  Bilateral leg pain Etiology unclear.  No efficacy in performing lower extremity venous Dopplers as the patient is already on anticoagulation.  Arterial Dopplers and ABIs to evaluate for circulation pending.  Folate and B12 normal.  Continue gabapentin to treat for any neuropathic pain.  Counseled the patient on smoking cessation.  Appreciate PT evaluation, recommending SNF.  COPD Stable.  Prophylaxis Therapeutic INR, Coumadin.  Disposition Pending.   LOS: 3 days  Merdith Boyd A, MD 11/07/2011, 12:27 PM

## 2011-11-07 NOTE — Care Management Note (Signed)
  Page 1 of 1   11/07/2011     2:41:07 PM   CARE MANAGEMENT NOTE 11/07/2011  Patient:  Grant Lara, Grant Lara   Account Number:  [id number]  Date Initiated:  11/07/2011  Documentation initiated by:  Magdalen Spatz  Subjective/Objective Assessment:   DX: small bowel obstruction     Action/Plan:   Anticipated DC Date:  11/10/2011   Anticipated DC Plan:  SKILLED NURSING FACILITY  In-house referral  Clinical Social Worker         Choice offered to / List presented to:             Status of service:  In process, will continue to follow Medicare Important Message given?   (If response is "NO", the following Medicare IM given date fields will be blank) Date Medicare IM given:   Date Additional Medicare IM given:    Discharge Disposition:    Per UR Regulation:  Reviewed for med. necessity/level of care/duration of stay  If discussed at [place] of Stay Meetings, dates discussed:    Comments:  11-07-11 PT now recommending SNF. Patient agreeable. SW is involved. Patietn wanting to change PCP's . Patietn does have insurance. Gave patient [employer] card. Also instructed patient he can call his insurance company for a list of providers in network.   Magdalen Spatz RN BSN [phone]

## 2011-11-08 DIAGNOSIS — R918 Other nonspecific abnormal finding of lung field: Secondary | ICD-10-CM | POA: Diagnosis present

## 2011-11-08 DIAGNOSIS — M79609 Pain in unspecified limb: Secondary | ICD-10-CM

## 2011-11-08 LAB — PROTIME-INR: Prothrombin Time: 25.2 seconds — ABNORMAL HIGH (ref 11.6–15.2)

## 2011-11-08 MED ORDER — VITAMIN B-12 100 MCG PO TABS
100.0000 ug | ORAL_TABLET | Freq: Every day | ORAL | Status: DC
  Filled 2011-11-08: qty 1

## 2011-11-08 MED ORDER — SENNOSIDES-DOCUSATE SODIUM 8.6-50 MG PO TABS: 1.0000 | ORAL_TABLET | Freq: Every evening | ORAL | Status: DC | PRN

## 2011-11-08 MED ORDER — POLYETHYLENE GLYCOL 3350 17 G PO PACK: 17.0000 g | PACK | Freq: Every day | ORAL | Status: AC

## 2011-11-08 MED ORDER — GABAPENTIN 300 MG PO CAPS: 300.0000 mg | ORAL_CAPSULE | Freq: Three times a day (TID) | ORAL | Status: DC

## 2011-11-08 MED ORDER — HYDROCODONE-ACETAMINOPHEN 5-325 MG PO TABS: 1.0000 | ORAL_TABLET | Freq: Four times a day (QID) | ORAL | Status: AC | PRN

## 2011-11-08 MED ORDER — CYANOCOBALAMIN 100 MCG PO TABS: 100.0000 ug | ORAL_TABLET | Freq: Every day | ORAL | Status: DC

## 2011-11-08 MED ORDER — POLYETHYLENE GLYCOL 3350 17 G PO PACK
17.0000 g | PACK | Freq: Every day | ORAL | Status: DC
  Filled 2011-11-08: qty 1

## 2011-11-08 NOTE — Progress Notes (Signed)
Clinical Social Work  CSW faxed [employer] and medications to SNF [facility]) who is agreeable to admission. CSW prepared dc packet. CSW informed patient, wife and RN of dc who were all agreeable. Wife will transport patient to SNF. CSW is signing off.  [place], [place] [phone]

## 2011-11-08 NOTE — Progress Notes (Signed)
ANTICOAGULATION CONSULT NOTE - Follow Up Consult  Pharmacy Consult for Warfarin Indication: Hx DVT/PE, CVA  No Known Allergies  Patient Measurements: Height: 5' 9"  (175.3 cm) Weight: 155 lb 6.8 oz (70.5 kg) IBW/kg (Calculated) : 70.7  Heparin Dosing Weight: 70.5  Vital Signs: Temp: 97.9 F (36.6 C) (07/26 0500) BP: 134/68 mmHg (07/26 0500) Pulse Rate: 61  (07/26 0500)  Labs:  Basename 11/08/11 0603 11/07/11 0508 11/06/11 1458 11/06/11 1300 11/06/11 0305 11/05/11 2000 11/05/11 1151  HGB -- 12.8* -- -- 13.5 -- --  HCT -- 39.2 -- -- 41.0 -- --  PLT -- 156 -- -- 153 -- --  APTT -- -- -- -- -- -- --  LABPROT 25.2* 25.9* 24.9* -- -- -- --  INR 2.24* 2.32* 2.21* -- -- -- --  HEPARINUNFRC -- <0.10* 0.92* 1.01* -- -- --  CREATININE -- 1.19 -- -- 1.15 -- --  CKTOTAL -- -- -- -- -- 68 107  CKMB -- -- -- -- -- 1.4 1.9  TROPONINI -- -- -- -- -- <0.30 <0.30    Estimated Creatinine Clearance: 55.1 ml/min (by C-G formula based on Cr of 1.19).   Assessment: 71 y.o M on chronic coumadin PTA for h/o CVA, PE, DVT also with h/o Crohn's disease, found to have SBO which is clinically improving as well as R apical cavitary lung lesion.  Coumadin continues, noted IV heparin d/c 7/25 d/t therapeutic INR. INR remains therapeutic today. 7/25: noted H/H decreased slightly, Plts are WNL. Per RN report, patient is not bleeding.  PTA the patient was known to be taking Coumadin 5 mg daily EXCEPT for 7.5 mg on Tues/Thurs.   Goal of Therapy:  INR 2-3   Plan:  - Continue home Coumadin regimen - F/up INR daily for now  Jermall Isaacson K. Posey Pronto, PharmD, BCPS.  Clinical Pharmacist Pager [phone]. 11/08/2011 7:59 AM

## 2011-11-08 NOTE — Progress Notes (Signed)
Patient discharged to SNF [facility] transported by his wife. Discharge papaers given to wife, No questions verbalized.

## 2011-11-08 NOTE — Progress Notes (Signed)
VASCULAR LAB PRELIMINARY  ARTERIAL  ABI completed:  Right ABI indicates moderate decrease in flow.  Left ABI indicates normal flow.  This may be overestimated given the monophasic waveforms.  Although not a normal study, these results rule out an arterial etiology for his leg pain.    RIGHT    LEFT    PRESSURE WAVEFORM  PRESSURE WAVEFORM  BRACHIAL 146 triphaisic BRACHIAL 136 triphasic  DP 55 monophasic DP 157 monophasic  AT   AT    PT 99 monophasic PT 158 monophasic  PER   PER    GREAT TOE  NA GREAT TOE  NA    RIGHT LEFT  ABI 0.68  Moderate decrease 1.08 WNL--may be overestimated given waveforms     Taesha Goodell, RVT 11/08/2011, 10:37 AM

## 2011-11-08 NOTE — Discharge Summary (Signed)
Physician Discharge Summary  Clarance Bollard Lara FVC:[id number] DOB: January 21, 1941 DOA: 11/04/2011  PCP: Elyn Peers, MD  Admit date: 11/04/2011 Discharge date: 11/08/2011  Recommendations for Outpatient Follow-up:  Followup with Dr. Lamonte Sakai, pulmonary, for right upper lung lesion, office will call you for followup appointment.  Check PT/INR on 11/11/2011, to adjust Coumadin dose accordingly.   If bilateral leg pain does not improve consider EMG study, if EMG unremarkable consider vascular surgery evaluation for leg pain. If neck pain does not improve consider further evaluation.  Discharge Diagnoses:  Principal Problem:  *SBO (small bowel obstruction) Active Problems:  DM  TOBACCO ABUSE  CARDIOMYOPATHY  C O P D  Pulmonary embolism  CVA (cerebral infarction)  DVT (deep venous thrombosis)  Bilateral leg and foot pain  Abnormal CT scan of lung  Discharge Condition: Stable  Diet recommendation: Heart healthy diet  History of present illness:  On admission: "Grant Lara is an 71 y.o. male with hx of PFO, prior CVA, PE,and DVT necessitating lifelong anticoagulation with supratherapeutic INR, Hx of crohn's disease, HTN, DM, Hyperhomocysteinemia, with recent admission for abdominal pain found to be secondary to narrowing and circumferential thickening proximal to the ileocolic anastomosis (Dr Paulita Fujita), presents again tonight to the ER complaining of abdominal pain. Originally, he was under CODE STEMI because of EKG changes, but it was not a change from an old EKG. Further evaluation in the ER with abdominal pelvic CT showed ? Early small bowel obtruction, and also borderline lymphadenopathy with possible pelvic mass as well. He has no fever, chills, or diarrhea. No black or bloody stool. He has a normal WBC, unremarkable Hb, and slight elevated Cr to 1.8. His chest CT angiogram showed no PE, no dissection, but he has increased soft tissue density and cavitary pulmonary lesions along with numerous small  lesions worrisome for metastatic disease. Hospitalist was asked to admit for early or partial bowel obtruction likely secondary to adhesions."  Hospital Course:  Partial small bowel obstruction. Resolved.  Initially imaging on 11/05/2011 by CT showed findings suspicious for early or partial small bowel obstruction.  Patient was made n.p.o. and was managed conservatively with supportive care.  As his symptoms improved, repeat imaging on 11/05/2011 showed unremarkable bowel gas pattern, started on clear liquid diet and advanced as tolerated.  Prior to discharge patient was tolerating low-residue diet.  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  Pulmonary consultation evaluated the patient and recommended, outpatient CT in 3 months.  Pulmonary will arrange for an office visit in 3 months and CT.  Consider PET scan as outpatient.   DVT/bilateral PE in 2007 and CVA Continue coumadin.  INR therapeutic.  Needs to have PT INR checked on 11/11/2011.  Transient chest pain Resolved, EKG w/ ST changes, unchanged from prior, cardiac enzymes x3 negative.  Bilateral leg pain Etiology unclear.  No efficacy in performing lower extremity venous Dopplers as the patient is already on anticoagulation in the event that he did indeed have DVT.  Arterial Dopplers and ABIs done on 11/08/2011, right ABI indicates moderate decrease in flow, left ABI indicates normal flow.  Folate and B12 normal.  As Vitamin B12 was low normal, will start the patient on oral B12, to take for at least one month and to assess vitamin B 12 levels at that time. Continue gabapentin to treat for any neuropathic pain, may consider further titration of gabapentin up as outpatient if he has persistent pain.  Counseled the patient on smoking cessation, which the  patient has been informed can affect his vasculature.  If he has persistent leg pain consider nerve conduction studies (EMG) as outpatient.  If workup is negative  consider vascular surgery evaluation.  Neck pain Prior to discharge patient to the mild neck pain, likely musculoskeletal.  If persistent pain consider further workup as outpatient.  COPD Stable.  Procedures:  As above  Consultations:  Pulmonary  Discharge Exam: Filed Vitals:   11/08/11 0500  BP: 134/68  Pulse: 61  Temp: 97.9 F (36.6 C)  Resp: 16   Filed Vitals:   11/07/11 1320 11/07/11 1423 11/07/11 2230 11/08/11 0500  BP: 116/73  103/48 134/68  Pulse: 59 65 54 61  Temp: 97.8 F (36.6 C)  98.4 F (36.9 C) 97.9 F (36.6 C)  TempSrc:      Resp: 18  16 16   Height:      Weight:      SpO2: 100% 98% 100% 98%   Discharge Instructions  Discharge Orders    Future Orders Please Complete By Expires   Diet - low sodium heart healthy      Increase activity slowly      Discharge instructions      Comments:   Follow up with your primary care physician in 1 week.  Followup with Dr. Lamonte Sakai, pulmonary, for right upper lung lesion, office will call you for followup appointment.  Check PT/INR on 11/11/2011, to just Coumadin dose accordingly.  If leg pain does not improve consider EMG study, if EMG unremarkable consider vascular surgery evaluation for leg pain.  If neck pain does not improve consider further evaluation.     Medication List  As of 11/08/2011  1:06 PM   TAKE these medications         carvedilol 3.125 MG tablet   Commonly known as: COREG   Take 1 tablet (3.125 mg total) by mouth 2 (two) times daily.      cyanocobalamin 100 MCG tablet   Take 1 tablet (100 mcg total) by mouth daily.      gabapentin 300 MG capsule   Commonly known as: NEURONTIN   Take 1 capsule (300 mg total) by mouth 3 (three) times daily.      HYDROcodone-acetaminophen 5-325 MG per tablet   Commonly known as: NORCO/VICODIN   Take 1 tablet by mouth every 6 (six) hours as needed for pain.      lisinopril 5 MG tablet   Commonly known as: PRINIVIL,ZESTRIL   Take 0.5 tablets (2.5 mg total) by  mouth daily.      polyethylene glycol packet   Commonly known as: MIRALAX / GLYCOLAX   Take 17 g by mouth daily.      pravastatin 20 MG tablet   Commonly known as: PRAVACHOL   Take 1 tablet (20 mg total) by mouth every evening.      senna-docusate 8.6-50 MG per tablet   Commonly known as: Senokot-S   Take 1 tablet by mouth at bedtime as needed.      warfarin 5 MG tablet   Commonly known as: COUMADIN   Take 5-7.5 mg by mouth daily. Takes 1 tablet (39m) on Monday, Wednesday, Friday, Saturday, and Sunday; Takes 1.5 tablets (7.527m on Tuesday and Thursday           Follow-up Information    Follow up with BYCollene Gobble MD. (Office will call you with appointment and time for follow up CT)    Contact information:   [address] [place] [phone]       Follow up with PCP. Schedule an appointment as soon as possible for a visit in 1 week.          The results of significant diagnostics from this hospitalization (including imaging, microbiology, ancillary and laboratory) are listed below for reference.    Significant Diagnostic Studies: Dg Abd 1 View  11/06/2011  *RADIOLOGY REPORT*  Clinical Data: Partial small bowel obstruction.  ABDOMEN - 1 VIEW  Comparison: CT on 11/05/2011  Findings: Gas and stool seen throughout nondilated left colon.  No gas-containing dilated small bowel loops are seen.  Mild contrast is noted within the gallbladder likely from recent CT.  IMPRESSION: Unremarkable bowel gas pattern.  No gas-containing dilated small bowel loops.  Original Report Authenticated By: Marlaine Hind, M.D.   Ct Angio Chest W/cm &/or Wo Cm  11/05/2011  *RADIOLOGY REPORT*  Clinical Data:  Chest and abdominal pain.  CT ANGIOGRAPHY CHEST, ABDOMEN AND PELVIS  Technique:  Multidetector CT imaging through the chest, abdomen and pelvis was performed using the standard protocol during bolus administration of intravenous contrast.   Multiplanar reconstructed images including MIPs were obtained and reviewed to evaluate the vascular anatomy.  Contrast: 42m OMNIPAQUE IOHEXOL 350 MG/ML SOLN  Comparison:   Chest CT 02/18/2010.  CTA CHEST  Findings:  The chest wall is unremarkable.  No supraclavicular or axillary lymphadenopathy.  A small right thyroid nodule is noted. This is stable. The bony thorax is intact.  The heart is normal in size.  No pericardial effusion.  No mediastinal or hilar lymphadenopathy.  Small scattered lymph nodes are noted.  The aorta is normal in caliber.  No dissection.  Three- vessel coronary artery calcifications are noted.  The esophagus is grossly normal.  The pulmonary arterial tree is well opacified.  No filling defects to suggest pulmonary emboli.  Examination of the lung parenchyma demonstrates a right apical cavitary lung lesion. This was present on the prior chest CT but there is new soft tissue nodularity associated with the lesion which is somewhat worrisome.  There are also multiple small bilateral pulmonary nodules some which are cavitary and worrisome for metastasis.  PET CT recommended for further evaluation.   Review of the MIP images confirms the above findings.  IMPRESSION:  1.  No CT findings for pulmonary embolism. 2.  Normal thoracic aorta. 3.  Three-vessel coronary artery calcifications. 4.  Increasing soft tissue nodularity associated with the right apical cavitary lung lesion with numerous small bilateral pulmonary nodules worrisome for metastatic disease.  PET CT recommended for further evaluation.  CTA ABDOMEN AND PELVIS  Findings:  The abdominal aorta demonstrates moderate atherosclerotic calcifications but no focal aneurysm or dissection. Moderate atherosclerotic change involving the iliac arteries and branch vessels. The major branch vessels are patient's.  The liver is unremarkable.  No focal lesions or biliary dilatation. The gallbladder is normal.  No common bile duct dilatation.  The  pancreas is unremarkable.  The spleen is normal in size.  No focal lesions.  The adrenal glands are normal.  There are multiple bilateral renal cysts.  No worrisome renal lesions or hydronephrosis.  The stomach, duodenum, small bowel and colon grossly normal without oral contrast.  There is moderate stool in the colon which may suggest constipation.  Surgical changes from prior bowel surgery are noted.  The patient appears to have a prior right colectomy with a small bowel transverse colon anastomoses.  The colon is slightly dilated  near the anastomoses and there are dilated small bowel loops in the upper and mid pelvis with a transition to normal/decompressed small bowel loops which may suggest an early or partial small bowel obstruction due to adhesions. There are borderline enlarged mesenteric nodes in the right abdomen and in the right pelvis there are either matted small bowel loops or possible pelvic mass just above the bladder.  A CT abdomen/pelvis with oral contrast may be helpful for further evaluation.  The bladder demonstrates mild diffuse wall thickening but no focal mass.  The prostate gland and seminal vesicles are grossly normal. No pelvic mass or lymphadenopathy.  The bony structures are intact.   Review of the MIP images confirms the above findings.  IMPRESSION:  1.  Moderate atherosclerotic changes involving the aorta and branch vessels but no focal aneurysm or dissection. 2.  Evidence of prior bowel surgery as discussed above.  Findings suspicious for an early or partial small bowel obstruction, likely due to adhesions in the upper pelvic area. 3.  Borderline enlarged mesenteric lymph nodes and possible pelvic mass.  CT abdomen/pelvis with  oral contrast may be helpful.  Original Report Authenticated By: P. Kalman Jewels, M.D.   Dg Chest Port 1 View  11/04/2011  *RADIOLOGY REPORT*  Clinical Data: Chest pain.  PORTABLE CHEST - 1 VIEW  Comparison: 01/30/2011.  Findings: The cardiac silhouette,  mediastinal and hilar contours are within normal limits and stable.  Slightly low lung volumes with mild vascular crowding and streaky areas of atelectasis.  No infiltrates, edema or effusions.  IMPRESSION: No acute cardiopulmonary findings.  Original Report Authenticated By: P. Kalman Jewels, M.D.   Ct Cta Abd/pel W/cm &/or W/o Cm  11/05/2011  *RADIOLOGY REPORT*  Clinical Data:  Chest and abdominal pain.  CT ANGIOGRAPHY CHEST, ABDOMEN AND PELVIS  Technique:  Multidetector CT imaging through the chest, abdomen and pelvis was performed using the standard protocol during bolus administration of intravenous contrast.  Multiplanar reconstructed images including MIPs were obtained and reviewed to evaluate the vascular anatomy.  Contrast: 60m OMNIPAQUE IOHEXOL 350 MG/ML SOLN  Comparison:   Chest CT 02/18/2010.  CTA CHEST  Findings:  The chest wall is unremarkable.  No supraclavicular or axillary lymphadenopathy.  A small right thyroid nodule is noted. This is stable. The bony thorax is intact.  The heart is normal in size.  No pericardial effusion.  No mediastinal or hilar lymphadenopathy.  Small scattered lymph nodes are noted.  The aorta is normal in caliber.  No dissection.  Three- vessel coronary artery calcifications are noted.  The esophagus is grossly normal.  The pulmonary arterial tree is well opacified.  No filling defects to suggest pulmonary emboli.  Examination of the lung parenchyma demonstrates a right apical cavitary lung lesion. This was present on the prior chest CT but there is new soft tissue nodularity associated with the lesion which is somewhat worrisome.  There are also multiple small bilateral pulmonary nodules some which are cavitary and worrisome for metastasis.  PET CT recommended for further evaluation.   Review of the MIP images confirms the above findings.  IMPRESSION:  1.  No CT findings for pulmonary embolism. 2.  Normal thoracic aorta. 3.  Three-vessel coronary artery calcifications.  4.  Increasing soft tissue nodularity associated with the right apical cavitary lung lesion with numerous small bilateral pulmonary nodules worrisome for metastatic disease.  PET CT recommended for further evaluation.  CTA ABDOMEN AND PELVIS  Findings:  The abdominal aorta demonstrates moderate atherosclerotic  calcifications but no focal aneurysm or dissection. Moderate atherosclerotic change involving the iliac arteries and branch vessels. The major branch vessels are patient's.  The liver is unremarkable.  No focal lesions or biliary dilatation. The gallbladder is normal.  No common bile duct dilatation.  The pancreas is unremarkable.  The spleen is normal in size.  No focal lesions.  The adrenal glands are normal.  There are multiple bilateral renal cysts.  No worrisome renal lesions or hydronephrosis.  The stomach, duodenum, small bowel and colon grossly normal without oral contrast.  There is moderate stool in the colon which may suggest constipation.  Surgical changes from prior bowel surgery are noted.  The patient appears to have a prior right colectomy with a small bowel transverse colon anastomoses.  The colon is slightly dilated near the anastomoses and there are dilated small bowel loops in the upper and mid pelvis with a transition to normal/decompressed small bowel loops which may suggest an early or partial small bowel obstruction due to adhesions. There are borderline enlarged mesenteric nodes in the right abdomen and in the right pelvis there are either matted small bowel loops or possible pelvic mass just above the bladder.  A CT abdomen/pelvis with oral contrast may be helpful for further evaluation.  The bladder demonstrates mild diffuse wall thickening but no focal mass.  The prostate gland and seminal vesicles are grossly normal. No pelvic mass or lymphadenopathy.  The bony structures are intact.   Review of the MIP images confirms the above findings.  IMPRESSION:  1.  Moderate atherosclerotic  changes involving the aorta and branch vessels but no focal aneurysm or dissection. 2.  Evidence of prior bowel surgery as discussed above.  Findings suspicious for an early or partial small bowel obstruction, likely due to adhesions in the upper pelvic area. 3.  Borderline enlarged mesenteric lymph nodes and possible pelvic mass.  CT abdomen/pelvis with  oral contrast may be helpful.  Original Report Authenticated By: P. Kalman Jewels, M.D.    Microbiology: No results found for this or any previous visit (from the past 240 hour(s)).   Labs: Basic Metabolic Panel:  Lab 44/01/02 0508 11/06/11 0305 11/05/11 0530 11/04/11 2326 11/04/11 2304  NA 135 138 140 140 141  K 3.9 4.0 4.5 4.5 4.5  CL 104 105 104 103 102  CO2 24 24 24  -- 30  GLUCOSE 128* 139* 146* 167* 172*  BUN 16 14 17 18 17   CREATININE 1.19 1.15 1.40* 1.80* 1.78*  CALCIUM 8.5 8.4 8.9 -- 9.5  MG 1.6 -- -- -- --  PHOS -- -- -- -- --   Liver Function Tests:  Lab 11/04/11 2304  AST 18  ALT 14  ALKPHOS 109  BILITOT 0.6  PROT 7.6  ALBUMIN 3.4*   No results found for this basename: LIPASE:5,AMYLASE:5 in the last 168 hours No results found for this basename: AMMONIA:5 in the last 168 hours CBC:  Lab 11/07/11 0508 11/06/11 0305 11/05/11 0530 11/04/11 2326 11/04/11 2304  WBC 6.7 7.4 7.7 -- 7.4  NEUTROABS -- -- -- -- --  HGB 12.8* 13.5 14.5 17.0 15.9  HCT 39.2 41.0 44.2 50.0 47.2  MCV 91.4 91.9 91.7 -- 91.8  PLT 156 153 173 -- 182   Cardiac Enzymes:  Lab 11/05/11 2000 11/05/11 1151 11/05/11 0530 11/04/11 2304  CKTOTAL 68 107 89 123  CKMB 1.4 1.9 1.9 2.2  CKMBINDEX -- -- -- --  TROPONINI <0.30 <0.30 <0.30 --   BNP: BNP (last  3 results)  Basename 01/31/11 0655  PROBNP 275.0*   CBG: No results found for this basename: GLUCAP:5 in the last 168 hours  Time coordinating discharge: 40 mins  Signed:  Rainer Mounce A  [facility] 11/08/2011, 1:06 PM

## 2011-11-08 NOTE — Progress Notes (Signed)
Subjective: Abdomen feels better.  Still having bilateral leg pain. Complaining of neck pain.  Objective: Vital signs in last 24 hours: Filed Vitals:   11/07/11 1320 11/07/11 1423 11/07/11 2230 11/08/11 0500  BP: 116/73  103/48 134/68  Pulse: 59 65 54 61  Temp: 97.8 F (36.6 C)  98.4 F (36.9 C) 97.9 F (36.6 C)  TempSrc:      Resp: 18  16 16   Height:      Weight:      SpO2: 100% 98% 100% 98%   Weight change:   Intake/Output Summary (Last 24 hours) at 11/08/11 1234 Last data filed at 11/08/11 0500  Gross per 24 hour  Intake 2126.75 ml  Output    975 ml  Net 1151.75 ml    Physical Exam: General: Awake, Oriented, No acute distress. HEENT: EOMI, good neck range of motion. Neck: Supple CV: S1 and S2 Lungs: Clear to ascultation bilaterally Abdomen: Soft, Nontender, Nondistended, +bowel sounds. Ext: Good pulses. Trace edema.  Lab Results: Basic Metabolic Panel:  Lab 54/56/25 0508 11/06/11 0305 11/05/11 0530 11/04/11 2326 11/04/11 2304  NA 135 138 140 140 141  K 3.9 4.0 4.5 4.5 4.5  CL 104 105 104 103 102  CO2 24 24 24  -- 30  GLUCOSE 128* 139* 146* 167* 172*  BUN 16 14 17 18 17   CREATININE 1.19 1.15 1.40* 1.80* 1.78*  CALCIUM 8.5 8.4 8.9 -- 9.5  MG 1.6 -- -- -- --  PHOS -- -- -- -- --   Liver Function Tests:  Lab 11/04/11 2304  AST 18  ALT 14  ALKPHOS 109  BILITOT 0.6  PROT 7.6  ALBUMIN 3.4*   No results found for this basename: LIPASE:5,AMYLASE:5 in the last 168 hours No results found for this basename: AMMONIA:5 in the last 168 hours CBC:  Lab 11/07/11 0508 11/06/11 0305 11/05/11 0530 11/04/11 2326 11/04/11 2304  WBC 6.7 7.4 7.7 -- 7.4  NEUTROABS -- -- -- -- --  HGB 12.8* 13.5 14.5 17.0 15.9  HCT 39.2 41.0 44.2 50.0 47.2  MCV 91.4 91.9 91.7 -- 91.8  PLT 156 153 173 -- 182   Cardiac Enzymes:  Lab 11/05/11 2000 11/05/11 1151 11/05/11 0530 11/04/11 2304  CKTOTAL 68 107 89 123  CKMB 1.4 1.9 1.9 2.2  CKMBINDEX -- -- -- --  TROPONINI <0.30 <0.30  <0.30 --   BNP (last 3 results)  Basename 01/31/11 0655  PROBNP 275.0*   CBG: No results found for this basename: GLUCAP:5 in the last 168 hours No results found for this basename: HGBA1C:5 in the last 72 hours Other Labs: No components found with this basename: POCBNP:3 No results found for this basename: DDIMER:2 in the last 168 hours No results found for this basename: CHOL:2,HDL:2,LDLCALC:2,TRIG:2,CHOLHDL:2,LDLDIRECT:2 in the last 168 hours  Lab 11/05/11 0530  TSH 0.590  T4TOTAL --  T3FREE --  FREET4 --  THYROIDAB --    Lab 11/07/11 0508  VITAMINB12 336  FOLATE 4.4  FERRITIN --  TIBC --  IRON --  RETICCTPCT --    Micro Results: No results found for this or any previous visit (from the past 240 hour(s)).  Studies/Results: No results found.  Medications: I have reviewed the patient's current medications. Scheduled Meds:    . carvedilol  3.125 mg Oral BID WC  . gabapentin  300 mg Oral TID  . simvastatin  10 mg Oral q1800  . warfarin  5 mg Oral Custom  . warfarin  7.5 mg Oral Custom  .  Warfarin - Pharmacist Dosing Inpatient   Does not apply q1800   Continuous Infusions:    . dextrose 5 % and 0.9% NaCl 75 mL/hr at 11/08/11 0806   PRN Meds:.acetaminophen, HYDROcodone-acetaminophen, HYDROmorphone (DILAUDID) injection, ondansetron (ZOFRAN) IV, ondansetron  Assessment/Plan: Partial small bowel obstruction. Resolved.  Abdominal x-ray today shows unremarkable bowel gas pattern.  Diet advanced to low residue diet.   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  Pulmonary consultation input appreciated.  Pulmonary planning on CT in 3 months, pulmonary will arrange for an office visit in 3 months and CT.  Consider PET scan as outpatient.   DVT/bilateral PE in 2007 and CVA Continue coumadin.  INR therapeutic.  Needs to have PT INR checked on 11/11/2011.  Transient chest pain Resolved, EKG w/ ST changes, unchanged from prior, cardiac  enzymes x3 negative.  Bilateral leg pain Etiology unclear.  No efficacy in performing lower extremity venous Dopplers as the patient is already on anticoagulation, if the patient has any DVT.  Arterial Dopplers and ABIs done on 11/08/2011, right ABI indicates moderate decrease in flow, left ABI indicates normal flow.  Folate and B12 normal.  As B12 was low normal, will start the patient on oral B12, to take for at least one month and to assess vitamin B 12 levels at that time. Continue gabapentin to treat for any neuropathic pain.  Counseled the patient on smoking cessation.  Patient has persistent leg pain consider nerve conduction studies as outpatient.  If workup is negative consider vascular surgery evaluation.  Neck pain Likely musculoskeletal.  If persistent pain consider further workup as outpatient.  COPD Stable.  Prophylaxis Therapeutic INR, Coumadin.  Disposition Discharge the patient today.   LOS: 4 days  Lavone Barrientes A, MD 11/08/2011, 12:34 PM

## 2011-12-13 ENCOUNTER — Ambulatory Visit (INDEPENDENT_AMBULATORY_CARE_PROVIDER_SITE_OTHER): Payer: Medicare Other | Admitting: Family Medicine

## 2011-12-13 ENCOUNTER — Ambulatory Visit (INDEPENDENT_AMBULATORY_CARE_PROVIDER_SITE_OTHER): Payer: Medicare Other | Admitting: *Deleted

## 2011-12-13 ENCOUNTER — Encounter: Payer: Self-pay | Admitting: Family Medicine

## 2011-12-13 VITALS — BP 110/60 | HR 64 | Temp 98.1°F | Resp 16 | Ht 69.0 in | Wt 153.8 lb

## 2011-12-13 DIAGNOSIS — M79609 Pain in unspecified limb: Secondary | ICD-10-CM

## 2011-12-13 DIAGNOSIS — M79606 Pain in leg, unspecified: Secondary | ICD-10-CM

## 2011-12-13 DIAGNOSIS — Z720 Tobacco use: Secondary | ICD-10-CM

## 2011-12-13 DIAGNOSIS — I639 Cerebral infarction, unspecified: Secondary | ICD-10-CM

## 2011-12-13 DIAGNOSIS — I82409 Acute embolism and thrombosis of unspecified deep veins of unspecified lower extremity: Secondary | ICD-10-CM

## 2011-12-13 DIAGNOSIS — I2699 Other pulmonary embolism without acute cor pulmonale: Secondary | ICD-10-CM

## 2011-12-13 DIAGNOSIS — I6350 Cerebral infarction due to unspecified occlusion or stenosis of unspecified cerebral artery: Secondary | ICD-10-CM

## 2011-12-13 DIAGNOSIS — F17200 Nicotine dependence, unspecified, uncomplicated: Secondary | ICD-10-CM

## 2011-12-13 DIAGNOSIS — K5090 Crohn's disease, unspecified, without complications: Secondary | ICD-10-CM

## 2011-12-13 DIAGNOSIS — I10 Essential (primary) hypertension: Secondary | ICD-10-CM

## 2011-12-13 LAB — POCT INR: INR: 3.3

## 2011-12-13 MED ORDER — PRAVASTATIN SODIUM 20 MG PO TABS: 20.0000 mg | ORAL_TABLET | Freq: Every evening | ORAL | Status: DC

## 2011-12-13 MED ORDER — CARVEDILOL 3.125 MG PO TABS: 3.1250 mg | ORAL_TABLET | Freq: Two times a day (BID) | ORAL | Status: DC

## 2011-12-13 MED ORDER — GABAPENTIN 300 MG PO CAPS: 300.0000 mg | ORAL_CAPSULE | Freq: Three times a day (TID) | ORAL | Status: DC

## 2011-12-13 MED ORDER — CYANOCOBALAMIN 100 MCG PO TABS: 100.0000 ug | ORAL_TABLET | Freq: Every day | ORAL | Status: AC

## 2011-12-13 MED ORDER — LISINOPRIL 5 MG PO TABS: 2.5000 mg | ORAL_TABLET | Freq: Every day | ORAL | Status: DC

## 2011-12-13 NOTE — Progress Notes (Signed)
  Subjective:    Patient ID: Grant Lara, male    DOB: 10-10-1940, 72 y.o.   MRN: [id number]  HPI  This 71 y.o. AA male is new to [facility], having been hospitalized in late July for SBO (a complication  of Crohn's disease diagnosed 20 + years ago). He has well-controlled HTN and cardiomyopathy; he is  managed by the Coumadin clinic with a hx of DVT.  He is requesting a GI specialist for management of  Crohn's disease.   Today, he wants evaluation for chronic leg pain- "feels like a toothache" in legs below the knee. While he  was hospitalized, lower ext arterial study was done and revealed minimal abnormality. He was prescribed  Gabapentin 300 mg tid and Hydrocodone-APAP to be used as needed for pain (he does not take it often).  He continues to smoke despite being made aware of the consequences of vascular complications and  worsening COPD. He also has mild hyperglycemia but takes no medication.   He does need refills on all medications except Coumadin (to f/u in Coumadin Clinic 12/26/11) and pain  medication.    Review of Systems  Constitutional: Positive for fatigue. Negative for fever, activity change, appetite change and unexpected weight change.  Cardiovascular: Negative for chest pain, palpitations and leg swelling.  Gastrointestinal: Negative for abdominal pain, diarrhea, constipation and blood in stool.  Musculoskeletal: Negative for back pain, joint swelling and arthralgias.  Neurological: Negative for dizziness, weakness, numbness and headaches.  Psychiatric/Behavioral: Negative for confusion, disturbed wake/sleep cycle, dysphoric mood and agitation. The patient is not nervous/anxious.        Objective:   Physical Exam  Nursing note and vitals reviewed. Constitutional: He is oriented to person, place, and time. He appears well-developed and well-nourished. No distress.  HENT:  Head: Normocephalic and atraumatic.  Eyes: Conjunctivae and EOM are normal. No scleral icterus.    Muddy sclerae and arcus senilis  Cardiovascular: Normal rate and regular rhythm.   Pulses:      Femoral pulses are 2+ on the right side, and 2+ on the left side.      Dorsalis pedis pulses are 1+ on the right side, and 1+ on the left side.       Posterior tibial pulses are 1+ on the right side, and 1+ on the left side.  Pulmonary/Chest: Effort normal. No respiratory distress.  Abdominal: Normal appearance. He exhibits no ascites, no pulsatile midline mass and no mass. Bowel sounds are decreased. There is no hepatosplenomegaly. There is tenderness. There is guarding. There is no rebound and no CVA tenderness. No hernia.  Musculoskeletal: He exhibits tenderness. He exhibits no edema.       Lower ext: shiny pre-tibial skin, tender to touch. No abnormal pigmentation.  Neurological: He is alert and oriented to person, place, and time. No cranial nerve deficit. Coordination normal.  Skin: Skin is warm and dry.          Assessment & Plan:   1. Pain in lower limb  Vitamin D, [id number], Basic metabolic panel, Vitamin E83 Continue Gabapentin tid and HC-APAP prn  2. HTN (hypertension)  Basic metabolic panel RF: Carvedilol and Lisinopril Follow-up at Coumadin Clinic on 12/26/11  3. Crohn's disease  Vitamin B12, Ambulatory referral to Gastroenterology Continue oral B12 supplement   4. Tobacco user  Encouraged cessation but doubtful pt will quit

## 2011-12-14 LAB — BASIC METABOLIC PANEL
BUN: 14 mg/dL (ref 6–23)
Calcium: 9.1 mg/dL (ref 8.4–10.5)
Chloride: 107 mEq/L (ref 96–112)
Creat: 1.34 mg/dL (ref 0.50–1.35)

## 2011-12-14 LAB — VITAMIN D 25 HYDROXY (VIT D DEFICIENCY, FRACTURES): Vit D, 25-Hydroxy: 20 ng/mL — ABNORMAL LOW (ref 30–89)

## 2011-12-21 NOTE — Progress Notes (Signed)
Quick Note:  Please call pt and advise that the following labs are abnormal... Vitamin D level is below normal; I am prescribing Vit D 50,000 IU 1 capsule once a week for several months.  When no refills are left, then get OTC Vitamin D3 1000 IU and take 1 capsule every day; I expect you can start with the OTC Vit D in January.  Vit B12 level is good. Continue that supplement. Kidney function, blood sugar and sodium and potassium are normal.  Copy to pt. ______

## 2011-12-23 ENCOUNTER — Encounter: Payer: Self-pay | Admitting: Radiology

## 2011-12-25 ENCOUNTER — Other Ambulatory Visit: Payer: Self-pay | Admitting: Family Medicine

## 2011-12-25 MED ORDER — ERGOCALCIFEROL 1.25 MG (50000 UT) PO CAPS: 50000.0000 [IU] | ORAL_CAPSULE | ORAL | Status: DC

## 2011-12-26 ENCOUNTER — Ambulatory Visit (INDEPENDENT_AMBULATORY_CARE_PROVIDER_SITE_OTHER): Payer: Medicare Other | Admitting: *Deleted

## 2011-12-26 DIAGNOSIS — I6350 Cerebral infarction due to unspecified occlusion or stenosis of unspecified cerebral artery: Secondary | ICD-10-CM

## 2011-12-26 DIAGNOSIS — I2699 Other pulmonary embolism without acute cor pulmonale: Secondary | ICD-10-CM

## 2011-12-26 DIAGNOSIS — I82409 Acute embolism and thrombosis of unspecified deep veins of unspecified lower extremity: Secondary | ICD-10-CM

## 2011-12-26 DIAGNOSIS — I639 Cerebral infarction, unspecified: Secondary | ICD-10-CM

## 2011-12-26 LAB — POCT INR: INR: 4.1

## 2012-01-01 NOTE — Progress Notes (Signed)
Quick Note:  Please call pt and advise that the following... Given that his Vit D=20, he needs to increase OTC Vit D3 (1000 IU) twice a day. ______

## 2012-01-09 ENCOUNTER — Ambulatory Visit (INDEPENDENT_AMBULATORY_CARE_PROVIDER_SITE_OTHER): Payer: Medicare Other | Admitting: *Deleted

## 2012-01-09 DIAGNOSIS — I6350 Cerebral infarction due to unspecified occlusion or stenosis of unspecified cerebral artery: Secondary | ICD-10-CM

## 2012-01-09 DIAGNOSIS — I82409 Acute embolism and thrombosis of unspecified deep veins of unspecified lower extremity: Secondary | ICD-10-CM

## 2012-01-09 DIAGNOSIS — I2699 Other pulmonary embolism without acute cor pulmonale: Secondary | ICD-10-CM

## 2012-01-09 DIAGNOSIS — I639 Cerebral infarction, unspecified: Secondary | ICD-10-CM

## 2012-01-13 ENCOUNTER — Telehealth: Payer: Self-pay | Admitting: Radiology

## 2012-01-13 ENCOUNTER — Inpatient Hospital Stay: Payer: Medicare Other | Admitting: Emergency Medicine

## 2012-01-13 NOTE — Telephone Encounter (Signed)
I got a request from pharmacy for Coumadin, however this patient goes to the coumadin clinic. I faxed back to [place] advised them to send it to Coumadin clinic at [facility]. FYI

## 2012-02-06 ENCOUNTER — Ambulatory Visit (INDEPENDENT_AMBULATORY_CARE_PROVIDER_SITE_OTHER): Payer: Medicare Other | Admitting: *Deleted

## 2012-02-06 DIAGNOSIS — I6350 Cerebral infarction due to unspecified occlusion or stenosis of unspecified cerebral artery: Secondary | ICD-10-CM

## 2012-02-06 DIAGNOSIS — I639 Cerebral infarction, unspecified: Secondary | ICD-10-CM

## 2012-02-06 DIAGNOSIS — I2699 Other pulmonary embolism without acute cor pulmonale: Secondary | ICD-10-CM

## 2012-02-06 DIAGNOSIS — I82409 Acute embolism and thrombosis of unspecified deep veins of unspecified lower extremity: Secondary | ICD-10-CM

## 2012-02-06 MED ORDER — WARFARIN SODIUM 5 MG PO TABS: ORAL_TABLET | ORAL | Status: DC

## 2012-02-16 ENCOUNTER — Encounter: Payer: Self-pay | Admitting: Family Medicine

## 2012-02-16 DIAGNOSIS — K5090 Crohn's disease, unspecified, without complications: Secondary | ICD-10-CM | POA: Insufficient documentation

## 2012-02-24 ENCOUNTER — Ambulatory Visit (INDEPENDENT_AMBULATORY_CARE_PROVIDER_SITE_OTHER): Payer: Medicare Other | Admitting: Pharmacist

## 2012-02-24 DIAGNOSIS — I6350 Cerebral infarction due to unspecified occlusion or stenosis of unspecified cerebral artery: Secondary | ICD-10-CM

## 2012-02-24 DIAGNOSIS — I82409 Acute embolism and thrombosis of unspecified deep veins of unspecified lower extremity: Secondary | ICD-10-CM

## 2012-02-24 DIAGNOSIS — I639 Cerebral infarction, unspecified: Secondary | ICD-10-CM

## 2012-02-24 DIAGNOSIS — I2699 Other pulmonary embolism without acute cor pulmonale: Secondary | ICD-10-CM

## 2012-03-05 ENCOUNTER — Ambulatory Visit (INDEPENDENT_AMBULATORY_CARE_PROVIDER_SITE_OTHER): Payer: Medicare Other | Admitting: *Deleted

## 2012-03-05 DIAGNOSIS — I639 Cerebral infarction, unspecified: Secondary | ICD-10-CM

## 2012-03-05 DIAGNOSIS — I82409 Acute embolism and thrombosis of unspecified deep veins of unspecified lower extremity: Secondary | ICD-10-CM

## 2012-03-05 DIAGNOSIS — I6350 Cerebral infarction due to unspecified occlusion or stenosis of unspecified cerebral artery: Secondary | ICD-10-CM

## 2012-03-05 DIAGNOSIS — I2699 Other pulmonary embolism without acute cor pulmonale: Secondary | ICD-10-CM

## 2012-03-05 LAB — POCT INR: INR: 3.7

## 2012-03-10 ENCOUNTER — Ambulatory Visit (INDEPENDENT_AMBULATORY_CARE_PROVIDER_SITE_OTHER): Payer: Medicare Other | Admitting: *Deleted

## 2012-03-10 DIAGNOSIS — I2699 Other pulmonary embolism without acute cor pulmonale: Secondary | ICD-10-CM

## 2012-03-10 DIAGNOSIS — I639 Cerebral infarction, unspecified: Secondary | ICD-10-CM

## 2012-03-10 DIAGNOSIS — I82409 Acute embolism and thrombosis of unspecified deep veins of unspecified lower extremity: Secondary | ICD-10-CM

## 2012-03-10 DIAGNOSIS — I6350 Cerebral infarction due to unspecified occlusion or stenosis of unspecified cerebral artery: Secondary | ICD-10-CM

## 2012-03-10 LAB — POCT INR: INR: 2.8

## 2012-04-03 ENCOUNTER — Ambulatory Visit (INDEPENDENT_AMBULATORY_CARE_PROVIDER_SITE_OTHER): Payer: Medicare Other | Admitting: *Deleted

## 2012-04-03 DIAGNOSIS — I2699 Other pulmonary embolism without acute cor pulmonale: Secondary | ICD-10-CM

## 2012-04-03 DIAGNOSIS — I6350 Cerebral infarction due to unspecified occlusion or stenosis of unspecified cerebral artery: Secondary | ICD-10-CM

## 2012-04-03 DIAGNOSIS — I639 Cerebral infarction, unspecified: Secondary | ICD-10-CM

## 2012-04-03 DIAGNOSIS — I82409 Acute embolism and thrombosis of unspecified deep veins of unspecified lower extremity: Secondary | ICD-10-CM

## 2012-04-29 ENCOUNTER — Ambulatory Visit (INDEPENDENT_AMBULATORY_CARE_PROVIDER_SITE_OTHER): Payer: Medicare Other | Admitting: *Deleted

## 2012-04-29 DIAGNOSIS — I639 Cerebral infarction, unspecified: Secondary | ICD-10-CM

## 2012-04-29 DIAGNOSIS — I82409 Acute embolism and thrombosis of unspecified deep veins of unspecified lower extremity: Secondary | ICD-10-CM

## 2012-04-29 DIAGNOSIS — I6350 Cerebral infarction due to unspecified occlusion or stenosis of unspecified cerebral artery: Secondary | ICD-10-CM

## 2012-04-29 DIAGNOSIS — I2699 Other pulmonary embolism without acute cor pulmonale: Secondary | ICD-10-CM

## 2012-04-29 LAB — POCT INR: INR: 2.1

## 2012-05-05 ENCOUNTER — Ambulatory Visit: Payer: Medicare Other | Admitting: Cardiology

## 2012-05-11 ENCOUNTER — Encounter: Payer: Self-pay | Admitting: Cardiology

## 2012-05-11 ENCOUNTER — Ambulatory Visit (INDEPENDENT_AMBULATORY_CARE_PROVIDER_SITE_OTHER): Payer: Medicare Other | Admitting: Cardiology

## 2012-05-11 VITALS — BP 130/80 | HR 69 | Ht 69.0 in | Wt 155.0 lb

## 2012-05-11 DIAGNOSIS — I428 Other cardiomyopathies: Secondary | ICD-10-CM

## 2012-05-11 NOTE — Progress Notes (Signed)
HPI The patient presents for one-year followup. We have seen him for a cardiomyopathy. His last EF in 2011 was about 40%. He actually is most concerned about it getting thinking fixed on his left 5th finger. He says he injured this at work about 5 years ago and he's been waiting for cardiac clearance prior to trying to get it fixed. He's afraid they're closing the case on his Workmen's Comp. He currently denies any cardiovascular symptoms. He denies any chest pressure, neck or arm discomfort. He has had no palpitations, presyncope or syncope. He has no PND or orthopnea. He has no weight gain or edema.  No Known Allergies  Current Outpatient Prescriptions  Medication Sig Dispense Refill  . carvedilol (COREG) 3.125 MG tablet Take 1 tablet (3.125 mg total) by mouth 2 (two) times daily.  60 tablet  3  . cyanocobalamin 100 MCG tablet Take 1 tablet (100 mcg total) by mouth daily.  30 tablet  3  . lisinopril (PRINIVIL,ZESTRIL) 5 MG tablet Take 0.5 tablets (2.5 mg total) by mouth daily.  30 tablet  3  . warfarin (COUMADIN) 5 MG tablet This is a 90 day supply  100 tablet  1    Past Medical History  Diagnosis Date  . Hypertension   . Dilated cardiomyopathy     Last echo 2/12: EF 30-35%, trivial AI, mild RAE.  [name] Chronic systolic heart failure   . CAD (coronary artery disease)     LHC 9/05 with Dr. Einar Gip:  dLM 20-30%, LAD 85%, oD1 20-30%.  PCI:  Taxus DES to LAD; Dx jailed and tx with POBA.  Last myoview 12/10: inf scar, no ischemia, EF 29%.  . DVT (deep venous thrombosis)   . Pulmonary embolus     chronic coumadin  . Hyperhomocystinemia   . PFO (patent foramen ovale)   . HLD (hyperlipidemia)   . Crohn's disease   . Nephrolithiasis   . Pneumonia ~ 2011  . Diabetes mellitus     11/05/11 "borderline; don't take medications"  . Stroke 1993    "left arm can't hold steady; leg too"  . Arthritis     "used to have a touch in my legs"    Past Surgical History  Procedure Date  . Colon surgery  1994; 1996    "for Crohn's disease"  . Appendectomy   . Coronary angioplasty with stent placement     "not sure but I think so"    ROS:  As stated in the HPI and negative for all other systems.  PHYSICAL EXAM BP 130/80  Pulse 69  Ht 5' 9"  (1.753 m)  Wt 155 lb (70.308 kg)  BMI 22.89 kg/m2 GENERAL:  Well appearing HEENT:  Pupils equal round and reactive, fundi not visualized, oral mucosa unremarkable NECK:  No jugular venous distention, waveform within normal limits, carotid upstroke brisk and symmetric, no bruits, no thyromegaly LYMPHATICS:  No cervical, inguinal adenopathy LUNGS:  Clear to auscultation bilaterally BACK:  No CVA tenderness CHEST:  Unremarkable HEART:  PMI not displaced or sustained,S1 and S2 within normal limits, no S3, no S4, no clicks, no rubs, no murmurs ABD:  Flat, positive bowel sounds normal in frequency in pitch, no bruits, no rebound, no guarding, no midline pulsatile mass, no hepatomegaly, no splenomegaly EXT:  2 plus pulses throughout, mild ankle edema, no cyanosis no clubbing SKIN:  No rashes no nodules NEURO:  Cranial nerves II through XII grossly intact, motor grossly intact throughout PSYCH:  Cognitively intact, oriented to  person place and time  EKG:  Sinus rhythm, rate 69, premature atrial contractions, left into the hypertrophy by voltage, short PR interval, nonspecific T-wave flattening.   05/11/2012  ASSESSMENT AND PLAN  CHRONIC SYSTOLIC HEART FAILURE -  The patient presents for followup of this and he seems to be euvolemic. I will repeat an echocardiogram as it has been sometime. He would be at acceptable risk for surgery to have his little finger fixed. I am providing this letter as a statement of this.   CORONARY ARTERY DISEASE S/P LAD DES/PTCA 2005 -  As above the patient will be going for low-risk procedure. He has no anginal symptoms. He has a moderate to high functional level. No further cardiovascular testing is indicated according to  ACC/AHA guidelines.   ESSENTIAL HYPERTENSION, BENIGN -  The blood pressure is at target. No change in medications is indicated. We will continue with therapeutic lifestyle changes (TLC).  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 He tolerates Coumadin. No change in therapy is indicated.   TOBACCO ABUSE -  We discussed the importance of quitting.  He hopes at some day he will quit.

## 2012-05-11 NOTE — Patient Instructions (Addendum)
The current medical regimen is effective;  continue present plan and medications.  Your physician has requested that you have an echocardiogram. Echocardiography is a painless test that uses sound waves to create images of your heart. It provides your doctor with information about the size and shape of your heart and how well your heart's chambers and valves are working. This procedure takes approximately one hour. There are no restrictions for this procedure.  Follow up as scheduled

## 2012-05-19 ENCOUNTER — Other Ambulatory Visit (HOSPITAL_COMMUNITY): Payer: Medicare Other

## 2012-05-26 ENCOUNTER — Ambulatory Visit (HOSPITAL_COMMUNITY): Payer: Medicare Other | Attending: Cardiology | Admitting: Radiology

## 2012-05-26 DIAGNOSIS — I428 Other cardiomyopathies: Secondary | ICD-10-CM | POA: Insufficient documentation

## 2012-05-26 DIAGNOSIS — I509 Heart failure, unspecified: Secondary | ICD-10-CM

## 2012-05-26 DIAGNOSIS — I080 Rheumatic disorders of both mitral and aortic valves: Secondary | ICD-10-CM | POA: Insufficient documentation

## 2012-05-26 NOTE — Progress Notes (Signed)
Echocardiogram performed.  

## 2012-05-29 ENCOUNTER — Other Ambulatory Visit (HOSPITAL_COMMUNITY): Payer: Medicare Other

## 2012-06-10 ENCOUNTER — Ambulatory Visit (INDEPENDENT_AMBULATORY_CARE_PROVIDER_SITE_OTHER): Payer: Medicare Other | Admitting: *Deleted

## 2012-06-10 DIAGNOSIS — I82409 Acute embolism and thrombosis of unspecified deep veins of unspecified lower extremity: Secondary | ICD-10-CM

## 2012-06-10 DIAGNOSIS — I6350 Cerebral infarction due to unspecified occlusion or stenosis of unspecified cerebral artery: Secondary | ICD-10-CM

## 2012-06-10 DIAGNOSIS — I2699 Other pulmonary embolism without acute cor pulmonale: Secondary | ICD-10-CM

## 2012-06-10 DIAGNOSIS — I639 Cerebral infarction, unspecified: Secondary | ICD-10-CM

## 2012-06-10 LAB — POCT INR: INR: 1.8

## 2012-07-01 ENCOUNTER — Ambulatory Visit (INDEPENDENT_AMBULATORY_CARE_PROVIDER_SITE_OTHER): Payer: Medicare Other | Admitting: *Deleted

## 2012-07-01 ENCOUNTER — Other Ambulatory Visit: Payer: Self-pay | Admitting: Cardiology

## 2012-07-01 DIAGNOSIS — I82409 Acute embolism and thrombosis of unspecified deep veins of unspecified lower extremity: Secondary | ICD-10-CM

## 2012-07-01 DIAGNOSIS — I639 Cerebral infarction, unspecified: Secondary | ICD-10-CM

## 2012-07-01 DIAGNOSIS — I6350 Cerebral infarction due to unspecified occlusion or stenosis of unspecified cerebral artery: Secondary | ICD-10-CM

## 2012-07-01 DIAGNOSIS — I2699 Other pulmonary embolism without acute cor pulmonale: Secondary | ICD-10-CM

## 2012-07-01 LAB — POCT INR: INR: 1.5

## 2012-07-01 MED ORDER — WARFARIN SODIUM 5 MG PO TABS: ORAL_TABLET | ORAL | Status: DC

## 2012-07-24 ENCOUNTER — Ambulatory Visit (INDEPENDENT_AMBULATORY_CARE_PROVIDER_SITE_OTHER): Payer: Medicare Other | Admitting: *Deleted

## 2012-07-24 DIAGNOSIS — I6350 Cerebral infarction due to unspecified occlusion or stenosis of unspecified cerebral artery: Secondary | ICD-10-CM

## 2012-07-24 DIAGNOSIS — I639 Cerebral infarction, unspecified: Secondary | ICD-10-CM

## 2012-07-24 DIAGNOSIS — I82409 Acute embolism and thrombosis of unspecified deep veins of unspecified lower extremity: Secondary | ICD-10-CM

## 2012-07-24 DIAGNOSIS — I2699 Other pulmonary embolism without acute cor pulmonale: Secondary | ICD-10-CM

## 2012-07-24 LAB — POCT INR: INR: 1.9

## 2012-08-07 ENCOUNTER — Ambulatory Visit (INDEPENDENT_AMBULATORY_CARE_PROVIDER_SITE_OTHER): Payer: Medicare Other | Admitting: Pharmacist

## 2012-08-07 DIAGNOSIS — I639 Cerebral infarction, unspecified: Secondary | ICD-10-CM

## 2012-08-07 DIAGNOSIS — I82409 Acute embolism and thrombosis of unspecified deep veins of unspecified lower extremity: Secondary | ICD-10-CM

## 2012-08-07 DIAGNOSIS — I6350 Cerebral infarction due to unspecified occlusion or stenosis of unspecified cerebral artery: Secondary | ICD-10-CM

## 2012-08-07 DIAGNOSIS — I2699 Other pulmonary embolism without acute cor pulmonale: Secondary | ICD-10-CM

## 2012-08-07 LAB — POCT INR: INR: 1.8

## 2012-08-17 ENCOUNTER — Ambulatory Visit (INDEPENDENT_AMBULATORY_CARE_PROVIDER_SITE_OTHER): Payer: Medicare Other

## 2012-08-17 DIAGNOSIS — I639 Cerebral infarction, unspecified: Secondary | ICD-10-CM

## 2012-08-17 DIAGNOSIS — I2699 Other pulmonary embolism without acute cor pulmonale: Secondary | ICD-10-CM

## 2012-08-17 DIAGNOSIS — I82409 Acute embolism and thrombosis of unspecified deep veins of unspecified lower extremity: Secondary | ICD-10-CM

## 2012-08-17 DIAGNOSIS — I6350 Cerebral infarction due to unspecified occlusion or stenosis of unspecified cerebral artery: Secondary | ICD-10-CM

## 2012-08-17 LAB — POCT INR: INR: 2.4

## 2012-09-04 ENCOUNTER — Ambulatory Visit (INDEPENDENT_AMBULATORY_CARE_PROVIDER_SITE_OTHER): Payer: Medicare Other | Admitting: *Deleted

## 2012-09-04 DIAGNOSIS — I639 Cerebral infarction, unspecified: Secondary | ICD-10-CM

## 2012-09-04 DIAGNOSIS — I2699 Other pulmonary embolism without acute cor pulmonale: Secondary | ICD-10-CM

## 2012-09-04 DIAGNOSIS — I82409 Acute embolism and thrombosis of unspecified deep veins of unspecified lower extremity: Secondary | ICD-10-CM

## 2012-09-04 DIAGNOSIS — I6350 Cerebral infarction due to unspecified occlusion or stenosis of unspecified cerebral artery: Secondary | ICD-10-CM

## 2012-09-04 LAB — POCT INR: INR: 1.4

## 2012-09-18 ENCOUNTER — Ambulatory Visit (INDEPENDENT_AMBULATORY_CARE_PROVIDER_SITE_OTHER): Payer: Medicare Other | Admitting: *Deleted

## 2012-09-18 DIAGNOSIS — I639 Cerebral infarction, unspecified: Secondary | ICD-10-CM

## 2012-09-18 DIAGNOSIS — I82409 Acute embolism and thrombosis of unspecified deep veins of unspecified lower extremity: Secondary | ICD-10-CM

## 2012-09-18 DIAGNOSIS — I6350 Cerebral infarction due to unspecified occlusion or stenosis of unspecified cerebral artery: Secondary | ICD-10-CM

## 2012-09-18 DIAGNOSIS — I2699 Other pulmonary embolism without acute cor pulmonale: Secondary | ICD-10-CM

## 2012-09-18 LAB — POCT INR: INR: 2.2

## 2012-10-02 ENCOUNTER — Ambulatory Visit (INDEPENDENT_AMBULATORY_CARE_PROVIDER_SITE_OTHER): Payer: Medicare Other | Admitting: *Deleted

## 2012-10-02 DIAGNOSIS — I6350 Cerebral infarction due to unspecified occlusion or stenosis of unspecified cerebral artery: Secondary | ICD-10-CM

## 2012-10-02 DIAGNOSIS — I82409 Acute embolism and thrombosis of unspecified deep veins of unspecified lower extremity: Secondary | ICD-10-CM

## 2012-10-02 DIAGNOSIS — I639 Cerebral infarction, unspecified: Secondary | ICD-10-CM

## 2012-10-02 DIAGNOSIS — I2699 Other pulmonary embolism without acute cor pulmonale: Secondary | ICD-10-CM

## 2012-10-02 LAB — POCT INR: INR: 2.7

## 2012-10-20 ENCOUNTER — Encounter: Payer: Self-pay | Admitting: Cardiology

## 2012-10-20 ENCOUNTER — Ambulatory Visit (INDEPENDENT_AMBULATORY_CARE_PROVIDER_SITE_OTHER): Payer: Medicare Other | Admitting: Cardiology

## 2012-10-20 VITALS — BP 136/79 | HR 66 | Ht 69.0 in | Wt 161.0 lb

## 2012-10-20 DIAGNOSIS — I428 Other cardiomyopathies: Secondary | ICD-10-CM

## 2012-10-20 DIAGNOSIS — F17200 Nicotine dependence, unspecified, uncomplicated: Secondary | ICD-10-CM

## 2012-10-20 DIAGNOSIS — I2510 Atherosclerotic heart disease of native coronary artery without angina pectoris: Secondary | ICD-10-CM

## 2012-10-20 DIAGNOSIS — R079 Chest pain, unspecified: Secondary | ICD-10-CM

## 2012-10-20 MED ORDER — LISINOPRIL 5 MG PO TABS: 2.5000 mg | ORAL_TABLET | Freq: Every day | ORAL | Status: DC

## 2012-10-20 MED ORDER — CARVEDILOL 3.125 MG PO TABS: 3.1250 mg | ORAL_TABLET | Freq: Two times a day (BID) | ORAL | Status: DC

## 2012-10-20 NOTE — Patient Instructions (Addendum)
The current medical regimen is effective;  continue present plan and medications.  Follow up in 1 year with Dr Hochrein.  You will receive a letter in the mail 2 months before you are due.  Please call us when you receive this letter to schedule your follow up appointment.  

## 2012-10-20 NOTE — Progress Notes (Signed)
HPI The patient presents for one-year followup. We have seen him for a cardiomyopathy. His last EF in 2011 was about 40%. He actually is most concerned about it getting thinking fixed on his left 5th finger. He says he injured this at work about 5 years ago and he's been waiting for cardiac clearance prior to trying to get it fixed. He's afraid they're closing the case on his Workmen's Comp. He currently denies any cardiovascular symptoms. He denies any chest pressure, neck or arm discomfort. He has had no palpitations, presyncope or syncope. He has no PND or orthopnea. He has no weight gain or edema.  No Known Allergies  Current Outpatient Prescriptions  Medication Sig Dispense Refill  . warfarin (COUMADIN) 5 MG tablet This is a 90 day supply  100 tablet  0  . carvedilol (COREG) 3.125 MG tablet Take 1 tablet (3.125 mg total) by mouth 2 (two) times daily.  60 tablet  3  . cyanocobalamin 100 MCG tablet Take 1 tablet (100 mcg total) by mouth daily.  30 tablet  3  . lisinopril (PRINIVIL,ZESTRIL) 5 MG tablet Take 0.5 tablets (2.5 mg total) by mouth daily.  30 tablet  3   No current facility-administered medications for this visit.    Past Medical History  Diagnosis Date  . Hypertension   . Dilated cardiomyopathy     Last echo 2/12: EF 30-35%, trivial AI, mild RAE.  [name] Chronic systolic heart failure   . CAD (coronary artery disease)     LHC 9/05 with Dr. Einar Gip:  dLM 20-30%, LAD 85%, oD1 20-30%.  PCI:  Taxus DES to LAD; Dx jailed and tx with POBA.  Last myoview 12/10: inf scar, no ischemia, EF 29%.  . DVT (deep venous thrombosis)   . Pulmonary embolus     chronic coumadin  . Hyperhomocystinemia   . PFO (patent foramen ovale)   . HLD (hyperlipidemia)   . Crohn's disease   . Nephrolithiasis   . Pneumonia ~ 2011  . Diabetes mellitus     11/05/11 "borderline; don't take medications"  . Stroke 1993    "left arm can't hold steady; leg too"  . Arthritis     "used to have a touch in my legs"     Past Surgical History  Procedure Laterality Date  . Colon surgery  1994; 1996    "for Crohn's disease"  . Appendectomy    . Coronary angioplasty with stent placement      "not sure but I think so"    ROS:  As stated in the HPI and negative for all other systems.  PHYSICAL EXAM BP 136/79  Pulse 66  Ht 5' 9"  (1.753 m)  Wt 161 lb (73.029 kg)  BMI 23.76 kg/m2 GENERAL:  Well appearing NECK:  No jugular venous distention, waveform within normal limits, carotid upstroke brisk and symmetric, no bruits, no thyromegaly LUNGS:  Clear to auscultation bilaterally BACK:  No CVA tenderness CHEST:  Unremarkable HEART:  PMI not displaced or sustained,S1 and S2 within normal limits, no S3, no S4, no clicks, no rubs, no murmurs ABD:  Flat, positive bowel sounds normal in frequency in pitch, no bruits, no rebound, no guarding, no midline pulsatile mass, no hepatomegaly, no splenomegaly EXT:  2 plus pulses throughout, mild ankle edema, no cyanosis no clubbing   EKG:  Sinus rhythm, rate 66, premature atrial contractions, left into the hypertrophy by voltage, short PR interval, nonspecific T-wave flattening.   10/20/2012  ASSESSMENT AND PLAN  CHRONIC SYSTOLIC HEART FAILURE -  He seems to be euvolemic.  At this point, no change in therapy is indicated.   No further cardiovascular testing is indicated.  He was encouraged to take his medications.  CORONARY ARTERY DISEASE S/P LAD DES/PTCA 2005 -  The patient has no new sypmtoms.  No further cardiovascular testing is indicated.  We will continue with aggressive risk reduction and meds as listed.  ESSENTIAL HYPERTENSION, BENIGN -  The blood pressure is at target. No change in medications is indicated. We will continue with therapeutic lifestyle changes (TLC).  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 He tolerates Coumadin. No change in therapy is indicated.   TOBACCO ABUSE -  We discussed the importance of quitting at  previous appts.  He hopes at some day he will quit.

## 2012-10-21 ENCOUNTER — Telehealth: Payer: Self-pay | Admitting: Cardiology

## 2012-10-21 ENCOUNTER — Other Ambulatory Visit: Payer: Self-pay | Admitting: Cardiology

## 2012-10-21 NOTE — Telephone Encounter (Signed)
New Problem:     Patient's wife called in wanting to speak with you about a refill you were supposed to send in yesterday.  Please call back.

## 2012-10-21 NOTE — Telephone Encounter (Signed)
Wife aware carvedilol and lisinopril were both refilled yesterday.  Refill for warfarin will need to come from the coumadin clinic.  The other medication he takes is something Dr Percival Spanish does not RX.  She states understanding.

## 2012-10-30 ENCOUNTER — Ambulatory Visit (INDEPENDENT_AMBULATORY_CARE_PROVIDER_SITE_OTHER): Payer: Medicare Other | Admitting: *Deleted

## 2012-10-30 DIAGNOSIS — I639 Cerebral infarction, unspecified: Secondary | ICD-10-CM

## 2012-10-30 DIAGNOSIS — I2699 Other pulmonary embolism without acute cor pulmonale: Secondary | ICD-10-CM

## 2012-10-30 DIAGNOSIS — I6350 Cerebral infarction due to unspecified occlusion or stenosis of unspecified cerebral artery: Secondary | ICD-10-CM

## 2012-10-30 DIAGNOSIS — I82409 Acute embolism and thrombosis of unspecified deep veins of unspecified lower extremity: Secondary | ICD-10-CM

## 2012-10-30 LAB — POCT INR: INR: 2

## 2012-10-30 MED ORDER — WARFARIN SODIUM 5 MG PO TABS: ORAL_TABLET | ORAL | Status: DC

## 2012-12-04 ENCOUNTER — Ambulatory Visit (INDEPENDENT_AMBULATORY_CARE_PROVIDER_SITE_OTHER): Payer: Medicare Other | Admitting: *Deleted

## 2012-12-04 DIAGNOSIS — I82409 Acute embolism and thrombosis of unspecified deep veins of unspecified lower extremity: Secondary | ICD-10-CM

## 2012-12-04 DIAGNOSIS — I6350 Cerebral infarction due to unspecified occlusion or stenosis of unspecified cerebral artery: Secondary | ICD-10-CM

## 2012-12-04 DIAGNOSIS — I2699 Other pulmonary embolism without acute cor pulmonale: Secondary | ICD-10-CM

## 2012-12-04 DIAGNOSIS — I639 Cerebral infarction, unspecified: Secondary | ICD-10-CM

## 2012-12-04 LAB — POCT INR: INR: 2

## 2013-01-15 ENCOUNTER — Ambulatory Visit (INDEPENDENT_AMBULATORY_CARE_PROVIDER_SITE_OTHER): Payer: Medicare Other | Admitting: *Deleted

## 2013-01-15 DIAGNOSIS — I639 Cerebral infarction, unspecified: Secondary | ICD-10-CM

## 2013-01-15 DIAGNOSIS — I82409 Acute embolism and thrombosis of unspecified deep veins of unspecified lower extremity: Secondary | ICD-10-CM

## 2013-01-15 DIAGNOSIS — I6350 Cerebral infarction due to unspecified occlusion or stenosis of unspecified cerebral artery: Secondary | ICD-10-CM

## 2013-01-15 DIAGNOSIS — I2699 Other pulmonary embolism without acute cor pulmonale: Secondary | ICD-10-CM

## 2013-02-12 ENCOUNTER — Ambulatory Visit (INDEPENDENT_AMBULATORY_CARE_PROVIDER_SITE_OTHER): Payer: Medicare Other | Admitting: *Deleted

## 2013-02-12 DIAGNOSIS — I6350 Cerebral infarction due to unspecified occlusion or stenosis of unspecified cerebral artery: Secondary | ICD-10-CM

## 2013-02-12 DIAGNOSIS — I2699 Other pulmonary embolism without acute cor pulmonale: Secondary | ICD-10-CM

## 2013-02-12 DIAGNOSIS — I82409 Acute embolism and thrombosis of unspecified deep veins of unspecified lower extremity: Secondary | ICD-10-CM

## 2013-02-12 DIAGNOSIS — I639 Cerebral infarction, unspecified: Secondary | ICD-10-CM

## 2013-03-15 ENCOUNTER — Ambulatory Visit (INDEPENDENT_AMBULATORY_CARE_PROVIDER_SITE_OTHER): Payer: Medicare Other | Admitting: Pharmacist

## 2013-03-15 DIAGNOSIS — I6350 Cerebral infarction due to unspecified occlusion or stenosis of unspecified cerebral artery: Secondary | ICD-10-CM

## 2013-03-15 DIAGNOSIS — I2699 Other pulmonary embolism without acute cor pulmonale: Secondary | ICD-10-CM

## 2013-03-15 DIAGNOSIS — I82409 Acute embolism and thrombosis of unspecified deep veins of unspecified lower extremity: Secondary | ICD-10-CM

## 2013-03-15 DIAGNOSIS — I639 Cerebral infarction, unspecified: Secondary | ICD-10-CM

## 2013-03-15 LAB — POCT INR: INR: 2.3

## 2013-04-13 ENCOUNTER — Ambulatory Visit (INDEPENDENT_AMBULATORY_CARE_PROVIDER_SITE_OTHER): Payer: Medicare Other

## 2013-04-13 DIAGNOSIS — I639 Cerebral infarction, unspecified: Secondary | ICD-10-CM

## 2013-04-13 DIAGNOSIS — I2699 Other pulmonary embolism without acute cor pulmonale: Secondary | ICD-10-CM

## 2013-04-13 DIAGNOSIS — I6350 Cerebral infarction due to unspecified occlusion or stenosis of unspecified cerebral artery: Secondary | ICD-10-CM

## 2013-04-13 DIAGNOSIS — I82409 Acute embolism and thrombosis of unspecified deep veins of unspecified lower extremity: Secondary | ICD-10-CM

## 2013-05-11 ENCOUNTER — Ambulatory Visit (INDEPENDENT_AMBULATORY_CARE_PROVIDER_SITE_OTHER): Payer: Medicare Other | Admitting: *Deleted

## 2013-05-11 DIAGNOSIS — I639 Cerebral infarction, unspecified: Secondary | ICD-10-CM

## 2013-05-11 DIAGNOSIS — I2699 Other pulmonary embolism without acute cor pulmonale: Secondary | ICD-10-CM

## 2013-05-11 DIAGNOSIS — I82409 Acute embolism and thrombosis of unspecified deep veins of unspecified lower extremity: Secondary | ICD-10-CM

## 2013-05-11 DIAGNOSIS — I6350 Cerebral infarction due to unspecified occlusion or stenosis of unspecified cerebral artery: Secondary | ICD-10-CM

## 2013-05-11 DIAGNOSIS — Z5181 Encounter for therapeutic drug level monitoring: Secondary | ICD-10-CM | POA: Insufficient documentation

## 2013-05-11 LAB — POCT INR: INR: 3.4

## 2013-05-27 ENCOUNTER — Ambulatory Visit (INDEPENDENT_AMBULATORY_CARE_PROVIDER_SITE_OTHER): Payer: Medicare Other | Admitting: *Deleted

## 2013-05-27 DIAGNOSIS — I639 Cerebral infarction, unspecified: Secondary | ICD-10-CM

## 2013-05-27 DIAGNOSIS — I6350 Cerebral infarction due to unspecified occlusion or stenosis of unspecified cerebral artery: Secondary | ICD-10-CM

## 2013-05-27 DIAGNOSIS — I82409 Acute embolism and thrombosis of unspecified deep veins of unspecified lower extremity: Secondary | ICD-10-CM

## 2013-05-27 DIAGNOSIS — Z5181 Encounter for therapeutic drug level monitoring: Secondary | ICD-10-CM

## 2013-05-27 DIAGNOSIS — I2699 Other pulmonary embolism without acute cor pulmonale: Secondary | ICD-10-CM

## 2013-05-27 LAB — POCT INR: INR: 3

## 2013-07-01 ENCOUNTER — Ambulatory Visit (INDEPENDENT_AMBULATORY_CARE_PROVIDER_SITE_OTHER): Payer: Medicare Other | Admitting: *Deleted

## 2013-07-01 DIAGNOSIS — I6350 Cerebral infarction due to unspecified occlusion or stenosis of unspecified cerebral artery: Secondary | ICD-10-CM

## 2013-07-01 DIAGNOSIS — I82409 Acute embolism and thrombosis of unspecified deep veins of unspecified lower extremity: Secondary | ICD-10-CM

## 2013-07-01 DIAGNOSIS — I639 Cerebral infarction, unspecified: Secondary | ICD-10-CM

## 2013-07-01 DIAGNOSIS — Z5181 Encounter for therapeutic drug level monitoring: Secondary | ICD-10-CM

## 2013-07-01 DIAGNOSIS — I2699 Other pulmonary embolism without acute cor pulmonale: Secondary | ICD-10-CM

## 2013-07-01 LAB — POCT INR: INR: 3.1

## 2013-07-12 ENCOUNTER — Encounter (HOSPITAL_COMMUNITY): Payer: Self-pay | Admitting: Emergency Medicine

## 2013-07-12 DIAGNOSIS — Z9861 Coronary angioplasty status: Secondary | ICD-10-CM

## 2013-07-12 DIAGNOSIS — Z86711 Personal history of pulmonary embolism: Secondary | ICD-10-CM

## 2013-07-12 DIAGNOSIS — Z8673 Personal history of transient ischemic attack (TIA), and cerebral infarction without residual deficits: Secondary | ICD-10-CM

## 2013-07-12 DIAGNOSIS — N289 Disorder of kidney and ureter, unspecified: Secondary | ICD-10-CM | POA: Diagnosis not present

## 2013-07-12 DIAGNOSIS — D72829 Elevated white blood cell count, unspecified: Secondary | ICD-10-CM | POA: Diagnosis present

## 2013-07-12 DIAGNOSIS — I5042 Chronic combined systolic (congestive) and diastolic (congestive) heart failure: Secondary | ICD-10-CM | POA: Diagnosis present

## 2013-07-12 DIAGNOSIS — K5000 Crohn's disease of small intestine without complications: Secondary | ICD-10-CM | POA: Diagnosis present

## 2013-07-12 DIAGNOSIS — Z79899 Other long term (current) drug therapy: Secondary | ICD-10-CM

## 2013-07-12 DIAGNOSIS — F17200 Nicotine dependence, unspecified, uncomplicated: Secondary | ICD-10-CM | POA: Diagnosis present

## 2013-07-12 DIAGNOSIS — K560 Paralytic ileus: Principal | ICD-10-CM | POA: Diagnosis present

## 2013-07-12 DIAGNOSIS — I2510 Atherosclerotic heart disease of native coronary artery without angina pectoris: Secondary | ICD-10-CM | POA: Diagnosis present

## 2013-07-12 DIAGNOSIS — Q211 Atrial septal defect: Secondary | ICD-10-CM

## 2013-07-12 DIAGNOSIS — E119 Type 2 diabetes mellitus without complications: Secondary | ICD-10-CM | POA: Diagnosis present

## 2013-07-12 DIAGNOSIS — N183 Chronic kidney disease, stage 3 unspecified: Secondary | ICD-10-CM | POA: Diagnosis present

## 2013-07-12 DIAGNOSIS — Z7901 Long term (current) use of anticoagulants: Secondary | ICD-10-CM

## 2013-07-12 DIAGNOSIS — I129 Hypertensive chronic kidney disease with stage 1 through stage 4 chronic kidney disease, or unspecified chronic kidney disease: Secondary | ICD-10-CM | POA: Diagnosis present

## 2013-07-12 DIAGNOSIS — I2589 Other forms of chronic ischemic heart disease: Secondary | ICD-10-CM | POA: Diagnosis present

## 2013-07-12 DIAGNOSIS — Z86718 Personal history of other venous thrombosis and embolism: Secondary | ICD-10-CM

## 2013-07-12 DIAGNOSIS — E875 Hyperkalemia: Secondary | ICD-10-CM | POA: Diagnosis not present

## 2013-07-12 DIAGNOSIS — Q2111 Secundum atrial septal defect: Secondary | ICD-10-CM

## 2013-07-12 DIAGNOSIS — T380X5A Adverse effect of glucocorticoids and synthetic analogues, initial encounter: Secondary | ICD-10-CM | POA: Diagnosis not present

## 2013-07-12 NOTE — ED Notes (Signed)
Pt. reports mid abdominal pain with nausea and vomitting onset this afternoon , denies fever or chills / no diarrhea .

## 2013-07-13 ENCOUNTER — Emergency Department (HOSPITAL_COMMUNITY): Payer: Medicare Other

## 2013-07-13 ENCOUNTER — Encounter (HOSPITAL_COMMUNITY): Payer: Self-pay | Admitting: Radiology

## 2013-07-13 ENCOUNTER — Inpatient Hospital Stay (HOSPITAL_COMMUNITY): Payer: Medicare Other

## 2013-07-13 ENCOUNTER — Inpatient Hospital Stay (HOSPITAL_COMMUNITY)
Admission: EM | Admit: 2013-07-13 | Discharge: 2013-07-18 | DRG: 389 | Disposition: A | Discharge status: Home / Self Care | Payer: Medicare Other | Attending: Internal Medicine | Admitting: Internal Medicine

## 2013-07-13 DIAGNOSIS — K5090 Crohn's disease, unspecified, without complications: Secondary | ICD-10-CM | POA: Diagnosis present

## 2013-07-13 DIAGNOSIS — I82409 Acute embolism and thrombosis of unspecified deep veins of unspecified lower extremity: Secondary | ICD-10-CM | POA: Diagnosis present

## 2013-07-13 DIAGNOSIS — I5022 Chronic systolic (congestive) heart failure: Secondary | ICD-10-CM

## 2013-07-13 DIAGNOSIS — R410 Disorientation, unspecified: Secondary | ICD-10-CM | POA: Diagnosis present

## 2013-07-13 DIAGNOSIS — I420 Dilated cardiomyopathy: Secondary | ICD-10-CM | POA: Diagnosis present

## 2013-07-13 DIAGNOSIS — K56609 Unspecified intestinal obstruction, unspecified as to partial versus complete obstruction: Secondary | ICD-10-CM

## 2013-07-13 DIAGNOSIS — N289 Disorder of kidney and ureter, unspecified: Secondary | ICD-10-CM

## 2013-07-13 DIAGNOSIS — I2510 Atherosclerotic heart disease of native coronary artery without angina pectoris: Secondary | ICD-10-CM | POA: Diagnosis present

## 2013-07-13 DIAGNOSIS — R748 Abnormal levels of other serum enzymes: Secondary | ICD-10-CM

## 2013-07-13 LAB — URINALYSIS, ROUTINE W REFLEX MICROSCOPIC
Glucose, UA: 100 mg/dL — AB
Ketones, ur: 15 mg/dL — AB
Nitrite: NEGATIVE
PROTEIN: 100 mg/dL — AB
Specific Gravity, Urine: 1.027 (ref 1.005–1.030)
Urobilinogen, UA: 0.2 mg/dL (ref 0.0–1.0)
pH: 5.5 (ref 5.0–8.0)

## 2013-07-13 LAB — GLUCOSE, CAPILLARY: GLUCOSE-CAPILLARY: 104 mg/dL — AB (ref 70–99)

## 2013-07-13 LAB — URINE MICROSCOPIC-ADD ON

## 2013-07-13 LAB — CBC WITH DIFFERENTIAL/PLATELET
Basophils Absolute: 0 10*3/uL (ref 0.0–0.1)
Basophils Relative: 0 % (ref 0–1)
EOS ABS: 0 10*3/uL (ref 0.0–0.7)
Eosinophils Relative: 0 % (ref 0–5)
HEMATOCRIT: 47.3 % (ref 39.0–52.0)
HEMOGLOBIN: 16.3 g/dL (ref 13.0–17.0)
Lymphocytes Relative: 10 % — ABNORMAL LOW (ref 12–46)
Lymphs Abs: 1 10*3/uL (ref 0.7–4.0)
MCH: 31.8 pg (ref 26.0–34.0)
MCHC: 34.5 g/dL (ref 30.0–36.0)
MCV: 92.4 fL (ref 78.0–100.0)
MONO ABS: 0.4 10*3/uL (ref 0.1–1.0)
MONOS PCT: 4 % (ref 3–12)
Neutro Abs: 8.7 10*3/uL — ABNORMAL HIGH (ref 1.7–7.7)
Neutrophils Relative %: 86 % — ABNORMAL HIGH (ref 43–77)
Platelets: 160 10*3/uL (ref 150–400)
RBC: 5.12 MIL/uL (ref 4.22–5.81)
RDW: 13.2 % (ref 11.5–15.5)
WBC: 10.2 10*3/uL (ref 4.0–10.5)

## 2013-07-13 LAB — COMPREHENSIVE METABOLIC PANEL
ALBUMIN: 3.3 g/dL — AB (ref 3.5–5.2)
ALT: 9 U/L (ref 0–53)
AST: 13 U/L (ref 0–37)
Alkaline Phosphatase: 118 U/L — ABNORMAL HIGH (ref 39–117)
BILIRUBIN TOTAL: 0.7 mg/dL (ref 0.3–1.2)
BUN: 16 mg/dL (ref 6–23)
CO2: 23 mEq/L (ref 19–32)
CREATININE: 1.47 mg/dL — AB (ref 0.50–1.35)
Calcium: 8.7 mg/dL (ref 8.4–10.5)
Chloride: 102 mEq/L (ref 96–112)
GFR calc Af Amer: 52 mL/min — ABNORMAL LOW (ref >90)
GFR calc non Af Amer: 45 mL/min — ABNORMAL LOW (ref >90)
Glucose, Bld: 163 mg/dL — ABNORMAL HIGH (ref 70–99)
Potassium: 4.8 mEq/L (ref 3.7–5.3)
Sodium: 140 mEq/L (ref 137–147)
TOTAL PROTEIN: 7.5 g/dL (ref 6.0–8.3)

## 2013-07-13 LAB — PROTIME-INR
INR: 2.57 — ABNORMAL HIGH (ref 0.00–1.49)
PROTHROMBIN TIME: 26.7 s — AB (ref 11.6–15.2)

## 2013-07-13 LAB — I-STAT TROPONIN, ED: TROPONIN I, POC: 0.01 ng/mL (ref 0.00–0.08)

## 2013-07-13 LAB — I-STAT CG4 LACTIC ACID, ED: Lactic Acid, Venous: 1.32 mmol/L (ref 0.5–2.2)

## 2013-07-13 LAB — LIPASE, BLOOD: Lipase: 68 U/L — ABNORMAL HIGH (ref 11–59)

## 2013-07-13 MED ORDER — WARFARIN - PHARMACIST DOSING INPATIENT: Freq: Every day | Status: DC

## 2013-07-13 MED ORDER — HYDRALAZINE HCL 20 MG/ML IJ SOLN
10.0000 mg | Freq: Four times a day (QID) | INTRAMUSCULAR | Status: DC | PRN
  Administered 2013-07-14: 10 mg via INTRAVENOUS
  Filled 2013-07-13: qty 1

## 2013-07-13 MED ORDER — WARFARIN SODIUM 5 MG PO TABS
5.0000 mg | ORAL_TABLET | Freq: Every day | ORAL | Status: DC
  Filled 2013-07-13: qty 1

## 2013-07-13 MED ORDER — LISINOPRIL 2.5 MG PO TABS
2.5000 mg | ORAL_TABLET | Freq: Every day | ORAL | Status: DC
  Filled 2013-07-13 (×4): qty 1

## 2013-07-13 MED ORDER — IOHEXOL 300 MG/ML  SOLN
100.0000 mL | Freq: Once | INTRAMUSCULAR | Status: AC | PRN
  Administered 2013-07-13: 100 mL via INTRAVENOUS

## 2013-07-13 MED ORDER — SODIUM CHLORIDE 0.9 % IV SOLN
Freq: Once | INTRAVENOUS | Status: AC
  Administered 2013-07-13: 02:00:00 via INTRAVENOUS

## 2013-07-13 MED ORDER — CARVEDILOL 3.125 MG PO TABS
3.1250 mg | ORAL_TABLET | Freq: Two times a day (BID) | ORAL | Status: DC
  Administered 2013-07-16 – 2013-07-18 (×4): 3.125 mg via ORAL
  Filled 2013-07-13 (×13): qty 1

## 2013-07-13 MED ORDER — SODIUM CHLORIDE 0.9 % IV SOLN
INTRAVENOUS | Status: DC
  Administered 2013-07-13 (×2): via INTRAVENOUS

## 2013-07-13 MED ORDER — METOPROLOL TARTRATE 1 MG/ML IV SOLN
5.0000 mg | INTRAVENOUS | Status: DC | PRN
Start: 2013-07-13 — End: 2013-07-18

## 2013-07-13 MED ORDER — BUTAMBEN-TETRACAINE-BENZOCAINE 2-2-14 % EX AERO
1.0000 | INHALATION_SPRAY | Freq: Four times a day (QID) | CUTANEOUS | Status: DC | PRN
Start: 2013-07-13 — End: 2013-07-18
  Administered 2013-07-14 – 2013-07-15 (×2): 1 via TOPICAL
  Filled 2013-07-13 (×2): qty 56

## 2013-07-13 MED ORDER — SODIUM CHLORIDE 0.9 % IV SOLN
INTRAVENOUS | Status: DC
  Administered 2013-07-13: 06:00:00 via INTRAVENOUS

## 2013-07-13 MED ORDER — ONDANSETRON HCL 4 MG/2ML IJ SOLN
4.0000 mg | Freq: Once | INTRAMUSCULAR | Status: AC
  Administered 2013-07-13: 4 mg via INTRAVENOUS
  Filled 2013-07-13: qty 2

## 2013-07-13 MED ORDER — IOHEXOL 300 MG/ML  SOLN
20.0000 mL | INTRAMUSCULAR | Status: AC
  Administered 2013-07-13: 20 mL via ORAL

## 2013-07-13 MED ORDER — SODIUM CHLORIDE 0.9 % IV SOLN: INTRAVENOUS | Status: DC

## 2013-07-13 MED ORDER — ONDANSETRON HCL 4 MG/2ML IJ SOLN: 4.0000 mg | Freq: Three times a day (TID) | INTRAMUSCULAR | Status: AC | PRN

## 2013-07-13 MED ORDER — HYDROMORPHONE HCL PF 1 MG/ML IJ SOLN
1.0000 mg | INTRAMUSCULAR | Status: AC | PRN
  Administered 2013-07-13: 1 mg via INTRAVENOUS
  Filled 2013-07-13: qty 1

## 2013-07-13 MED ORDER — PHENOL 1.4 % MT LIQD: 1.0000 | OROMUCOSAL | Status: DC | PRN

## 2013-07-13 MED ORDER — MORPHINE SULFATE 4 MG/ML IJ SOLN
4.0000 mg | Freq: Once | INTRAMUSCULAR | Status: AC
  Administered 2013-07-13: 4 mg via INTRAVENOUS
  Filled 2013-07-13: qty 1

## 2013-07-13 MED ORDER — LATANOPROST 0.005 % OP SOLN
1.0000 [drp] | Freq: Every day | OPHTHALMIC | Status: DC
  Administered 2013-07-13 – 2013-07-17 (×5): 1 [drp] via OPHTHALMIC
  Filled 2013-07-13: qty 2.5

## 2013-07-13 MED ORDER — MORPHINE SULFATE 2 MG/ML IJ SOLN
2.0000 mg | INTRAMUSCULAR | Status: DC | PRN
  Administered 2013-07-13 – 2013-07-14 (×2): 4 mg via INTRAVENOUS
  Filled 2013-07-13 (×2): qty 2

## 2013-07-13 NOTE — Progress Notes (Signed)
ANTICOAGULATION CONSULT NOTE - Initial Consult  Pharmacy Consult for Coumadin Indication: h/o PE/DVT  No Known Allergies  Patient Measurements: Height: 5' 9"  (175.3 cm) Weight: 158 lb (71.668 kg) IBW/kg (Calculated) : 70.7  Vital Signs: Temp: 98.1 F (36.7 C) (03/31 0326) Temp src: Oral (03/31 0326) BP: 125/65 mmHg (03/31 0326) Pulse Rate: 74 (03/31 0326)  Labs:  Recent Labs  07/13/13 0049  HGB 16.3  HCT 47.3  PLT 160  LABPROT 26.7*  INR 2.57*  CREATININE 1.47*    Estimated Creatinine Clearance: 44.1 ml/min (by C-G formula based on Cr of 1.47).   Medical History: Past Medical History  Diagnosis Date  . Hypertension   . Dilated cardiomyopathy     2/12: EF 30-35%, trivial AI, mild RAE.  EF 2014 40 -45%  . CAD (coronary artery disease)     LHC 9/05 with Dr. Einar Gip:  dLM 20-30%, LAD 85%, oD1 20-30%.  PCI:  Taxus DES to LAD; Dx jailed and tx with POBA.  Last myoview 12/10: inf scar, no ischemia, EF 29%.  . DVT (deep venous thrombosis)   . Pulmonary embolus     chronic coumadin  . Hyperhomocystinemia   . PFO (patent foramen ovale)     Not mentioned on 2014 echo.  [name] HLD (hyperlipidemia)   . Crohn's disease   . Nephrolithiasis   . Pneumonia ~ 2011  . Diabetes mellitus     11/05/11 "borderline; don't take medications"  . Stroke 1993    "left arm can't hold steady; leg too"  . Arthritis     "used to have a touch in my legs"    Assessment: 73yo male c/o mid-abdominal pain w/ N/V, CT reveals partial vs early SBO, to continue Coumadin for h/o PE/DVT; admitted w/ therapeutic INR.  Goal of Therapy:  INR 2-3   Plan:  Will continue home Coumadin dose of 51m daily and monitor INR for dose adjustments.  VWynona Neat PharmD, BCPS  07/13/2013,5:06 AM

## 2013-07-13 NOTE — ED Notes (Signed)
Patient transported to CT 

## 2013-07-13 NOTE — ED Notes (Signed)
Patient wallet sent home with wife.

## 2013-07-13 NOTE — ED Notes (Signed)
Family at bedside.mrs .Vavrek [phone].to let her what room he  Is going to.

## 2013-07-13 NOTE — Consult Note (Signed)
[facility] CONSULT Reason for consult: SBO and history of Crohn's disease Referring Physician: [facility]. PCP Dr. Nancy Fetter. Primary G.I.: Dr. Fredia Sorrow Grant Lara is an 73 y.o. male.  HPI: he has a history of Crohn's disease and is undergone 2 previous operations for Crohn's disease. These apparently were over 20 years ago and were done here in [place]. He is unable to tell me if they were done in [facility] hospital or at [facility]. He reports that he had part of hiscolon as well as part of the small bowel removed. He had his 1st episode of SBO 2012 around that time, colonoscopy by Dr. Cristina Gong to the anastomosis reveal no Crohn's disease in the colon and revealed a stenotic anastomosis that prevented passage of the scope. The patient followed up with Dr. Cristina Gong in the office on time and regular follow-up was suggested but he never returned. 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 He apparently has had a mild stroke in the past with good recovery. For the past couple days he has had abdominal distention and increase belching and abdominal pain and cramping. This brought him to the emergency room and CT scan showed dilated small bowel with  fluid and apparently to the area of the anastomosis. There was a question of thickening of the small bowel is proximal to the anastomosis. This was interpreted as partial early small bowel obstruction. The patient has been seen by surgery and medical therapy recommended currently.  Past Medical History  Diagnosis Date  . Hypertension   . Dilated cardiomyopathy     2/12: EF 30-35%, trivial AI, mild RAE.  EF 2014 40 -45%  . CAD (coronary artery disease)     LHC 9/05 with Dr. Einar Gip:  dLM 20-30%, LAD 85%, oD1 20-30%.  PCI:  Taxus DES to LAD; Dx jailed and tx with POBA.  Last myoview  12/10: inf scar, no ischemia, EF 29%.  . DVT (deep venous thrombosis)   . Pulmonary embolus     chronic coumadin  . Hyperhomocystinemia   . PFO (patent foramen ovale)     Not mentioned on 2014 echo.  [name] HLD (hyperlipidemia)   . Crohn's disease   . Nephrolithiasis   . Pneumonia ~ 2011  . Diabetes mellitus     11/05/11 "borderline; don't take medications"  . Stroke 1993    "left arm can't hold steady; leg too"  . Arthritis     "used to have a touch in my legs"    Past Surgical History  Procedure Laterality Date  . Colon surgery  1994; 1996    "for Crohn's disease"  . Appendectomy      No family history on file.  Social History:  reports that he has been smoking Cigarettes.  He has a 26.5 pack-year smoking history. He has never used smokeless tobacco. He reports that he does not drink alcohol or use illicit drugs.  Allergies: No Known Allergies  Medications; Prior to Admission medications   Medication Sig Start Date End Date Taking? Authorizing Provider  carvedilol (COREG) 3.125 MG tablet Take 1 tablet (3.125 mg total) by mouth 2 (two) times daily. 10/20/12 10/20/13 Yes Minus Breeding, MD  latanoprost (XALATAN) 0.005 % ophthalmic solution Place 1 drop into both eyes at bedtime. 06/29/13  Yes Historical Provider, MD  lisinopril (PRINIVIL,ZESTRIL) 5 MG tablet Take 0.5 tablets (2.5 mg total)  by mouth daily. 10/20/12 10/20/13 Yes Minus Breeding, MD  warfarin (COUMADIN) 5 MG tablet Take 5 mg by mouth daily.   Yes Historical Provider, MD   . carvedilol  3.125 mg Oral BID WC  . latanoprost  1 drop Both Eyes QHS  . lisinopril  2.5 mg Oral Daily   PRN Meds butamben-tetracaine-benzocaine, hydrALAZINE, HYDROmorphone (DILAUDID) injection, metoprolol, morphine injection, ondansetron (ZOFRAN) IV, phenol Results for orders placed during the hospital encounter of 07/13/13 (from the past 48 hour(s))  CBC WITH DIFFERENTIAL     Status: Abnormal   Collection Time    07/13/13 12:49 AM      Result  Value Ref Range   WBC 10.2  4.0 - 10.5 K/uL   RBC 5.12  4.22 - 5.81 MIL/uL   Hemoglobin 16.3  13.0 - 17.0 g/dL   HCT 47.3  39.0 - 52.0 %   MCV 92.4  78.0 - 100.0 fL   MCH 31.8  26.0 - 34.0 pg   MCHC 34.5  30.0 - 36.0 g/dL   RDW 13.2  11.5 - 15.5 %   Platelets 160  150 - 400 K/uL   Neutrophils Relative % 86 (*) 43 - 77 %   Neutro Abs 8.7 (*) 1.7 - 7.7 K/uL   Lymphocytes Relative 10 (*) 12 - 46 %   Lymphs Abs 1.0  0.7 - 4.0 K/uL   Monocytes Relative 4  3 - 12 %   Monocytes Absolute 0.4  0.1 - 1.0 K/uL   Eosinophils Relative 0  0 - 5 %   Eosinophils Absolute 0.0  0.0 - 0.7 K/uL   Basophils Relative 0  0 - 1 %   Basophils Absolute 0.0  0.0 - 0.1 K/uL  COMPREHENSIVE METABOLIC PANEL     Status: Abnormal   Collection Time    07/13/13 12:49 AM      Result Value Ref Range   Sodium 140  137 - 147 mEq/L   Potassium 4.8  3.7 - 5.3 mEq/L   Chloride 102  96 - 112 mEq/L   CO2 23  19 - 32 mEq/L   Glucose, Bld 163 (*) 70 - 99 mg/dL   BUN 16  6 - 23 mg/dL   Creatinine, Ser 1.47 (*) 0.50 - 1.35 mg/dL   Calcium 8.7  8.4 - 10.5 mg/dL   Total Protein 7.5  6.0 - 8.3 g/dL   Albumin 3.3 (*) 3.5 - 5.2 g/dL   AST 13  0 - 37 U/L   ALT 9  0 - 53 U/L   Alkaline Phosphatase 118 (*) 39 - 117 U/L   Total Bilirubin 0.7  0.3 - 1.2 mg/dL   GFR calc non Af Amer 45 (*) >90 mL/min   GFR calc Af Amer 52 (*) >90 mL/min   Comment: (NOTE)     The eGFR has been calculated using the CKD EPI equation.     This calculation has not been validated in all clinical situations.     eGFR's persistently <90 mL/min signify possible Chronic Kidney     Disease.  LIPASE, BLOOD     Status: Abnormal   Collection Time    07/13/13 12:49 AM      Result Value Ref Range   Lipase 68 (*) 11 - 59 U/L  PROTIME-INR     Status: Abnormal   Collection Time    07/13/13 12:49 AM      Result Value Ref Range   Prothrombin Time 26.7 (*) 11.6 - 15.2  seconds   INR 2.57 (*) 0.00 - 1.49  I-STAT TROPOININ, ED     Status: None   Collection  Time    07/13/13 12:56 AM      Result Value Ref Range   Troponin i, poc 0.01  0.00 - 0.08 ng/mL   Comment 3            Comment: Due to the release kinetics of cTnI,     a negative result within the first hours     of the onset of symptoms does not rule out     myocardial infarction with certainty.     If myocardial infarction is still suspected,     repeat the test at appropriate intervals.  URINALYSIS, ROUTINE W REFLEX MICROSCOPIC     Status: Abnormal   Collection Time    07/13/13  1:02 AM      Result Value Ref Range   Color, Urine AMBER (*) YELLOW   Comment: BIOCHEMICALS MAY BE AFFECTED BY COLOR   APPearance CLOUDY (*) CLEAR   Specific Gravity, Urine 1.027  1.005 - 1.030   pH 5.5  5.0 - 8.0   Glucose, UA 100 (*) NEGATIVE mg/dL   Hgb urine dipstick MODERATE (*) NEGATIVE   Bilirubin Urine SMALL (*) NEGATIVE   Ketones, ur 15 (*) NEGATIVE mg/dL   Protein, ur 100 (*) NEGATIVE mg/dL   Urobilinogen, UA 0.2  0.0 - 1.0 mg/dL   Nitrite NEGATIVE  NEGATIVE   Leukocytes, UA TRACE (*) NEGATIVE  URINE MICROSCOPIC-ADD ON     Status: Abnormal   Collection Time    07/13/13  1:02 AM      Result Value Ref Range   Squamous Epithelial / LPF FEW (*) RARE   WBC, UA 3-6  <3 WBC/hpf   RBC / HPF 21-50  <3 RBC/hpf   Bacteria, UA FEW (*) RARE   Casts GRANULAR CAST (*) NEGATIVE   Comment: HYALINE CASTS   Crystals CA OXALATE CRYSTALS (*) NEGATIVE   Sperm, UA PRESENT     Urine-Other MUCOUS PRESENT    I-STAT CG4 LACTIC ACID, ED     Status: None   Collection Time    07/13/13  3:20 AM      Result Value Ref Range   Lactic Acid, Venous 1.32  0.5 - 2.2 mmol/L    Ct Abdomen Pelvis W Contrast  07/13/2013   CLINICAL DATA:  Mid abdominal pain with nausea and vomiting.  EXAM: CT ABDOMEN AND PELVIS WITH CONTRAST  TECHNIQUE: Multidetector CT imaging of the abdomen and pelvis was performed using the standard protocol following bolus administration of intravenous contrast.  CONTRAST:  147m OMNIPAQUE IOHEXOL 300  MG/ML  SOLN  COMPARISON:  DG ABD 1 VIEW dated 11/06/2011; CT CTA ABD/PEL W/CM AND/OR W/O CM dated 11/05/2011; CT ABD/PELV WO CM dated 01/24/2011  FINDINGS: Included view of the lung a bases demonstrates dependent atelectasis. The heart appears upper limits of normal in size, mediastinal silhouette is nonsuspicious.  Subcentimeter hypodensity in left lobe of the liver likely reflects a cyst, the liver is otherwise unremarkable. Spleen, pancreas, adrenal glands are unremarkable. Subcentimeter gallstone.  Dilated small bowel up to 4.5 cm with transition point in the right lower quadrant associated with a small bowel surgical anastomosis, axial 54/89. Immediately above the anastomosis is circumferential wall thickening, coronal 28/76. Status post right hemicolectomy. Mild small bowel feces. Colonic diverticulosis with mild wall thickening, no superimposed inflammatory changes. No intraperitoneal free fluid or free air.  2 mm  nonobstructing right interpolar renal calculus. 4 mm right interpolar renal calculus. Multiple bilateral renal cysts measuring up to 3.7 cm in the left interpolar kidney. Additional bilateral too small to characterize hypodensities. Urinary bladder is partially distended and unremarkable. Prostate is not enlarged. Aortoiliac vessels are normal in course and caliber with mild to moderate calcific atherosclerosis, narrowing and of possible high-grade stenosis of the external iliac arteries not tailored for evaluation. No lymphadenopathy by CT size criteria. Subcentimeter mesenteric lymph nodes seen.  Moderate degenerate change the hips. Osseous structures are nonsuspicious.  IMPRESSION: Partial versus early small bowel obstruction with transition point immediately above surgical bowel anastomosis, status post right hemicolectomy. In addition, there is thickened appearance of the bowel immediately above the anastomosis and though this may be postoperative, recurrent disease may have this appearance.  Recommend follow-up.  Nonobstructing right nephrolithiasis measure up to 4 mm. Cholelithiasis without cholecystitis.   Electronically Signed   By: Elon Alas   On: 07/13/2013 04:30   Dg Abd Portable 1v  07/13/2013   CLINICAL DATA:  Assess Nasogastric tube placement.  EXAM: PORTABLE ABDOMEN - 1 VIEW  COMPARISON:  None.  FINDINGS: The esophagogastric tube tip and proximal port lie well below the expected location of the GE junction. There are loops of mildly distended gas-filled small bowel in the right mid abdomen. There is stool and gas and contrast in the descending colon. No free extraluminal gas collections are demonstrated. The lung bases exhibit no alveolar infiltrates or pleural effusions.  IMPRESSION: 1. The positioning of the nasogastric tube is nasal is radiographically good with the proximal port and tip lying well below the expected location of the GE junction. 2. The bowel gas pattern may reflect a partial small bowel obstruction.   Electronically Signed   By: David  Martinique   On: 07/13/2013 10:30               Blood pressure 160/78, pulse 56, temperature 97.5 F (36.4 C), temperature source Oral, resp. rate 18, height 5' 9"  (1.753 m), weight 71.668 kg (158 lb), SpO2 99.00%.  Physical exam:   General-- pleasant African-American male no acute distress. NG tube draining a large amount of gastric liquid Heart-- regular rate and rhythm without murmurs are gallops Lungs--clear Abdomen-- none distended and generally soft and nontender with lots of NG sounds.   Assessment: 1. SBO. CT suggest level of obstruction terminal ileum near anastomosis from previous surgery. Patient may have some active Crohn's really hasn't had a lot of diarrhea or other symptoms colonoscopy 3 years ago showed marked stenosis in this area in the main question I think at this point is this stenotic Crohn's or is he obstruction partially reversible. 2. Crohn's disease. To previous operations in the  1990s exact operations unclear whole records don't seem to be available recent colonoscopy suggested partial resection of the right: stenosis at the anastomosis 3. Multiple other problems include a history of DVT, pulmonary emboli, history of CAD and cardiomyopathy. Patient is chronically anticoagulant  Plan: 1. Agree with NG suction for several days. If it appears that his SBO has resolved, we can consider CT enterography and colonoscopy. If this obstruction is not resolved empiric therapy with IV steroids to see if any of this may be reversible would be reasonable. It may well be that he has nonreversible stenosis. 2. We will follow with you.    Jaiel Saraceno JR,Wesleigh Markovic L 07/13/2013, 12:37 PM

## 2013-07-13 NOTE — ED Provider Notes (Signed)
CSN: [id number]     Arrival date & time 07/12/13  2007 History   First MD Initiated Contact with Patient 07/13/13 0151     Chief Complaint  Patient presents with  . Abdominal Pain     (Consider location/radiation/quality/duration/timing/severity/associated sxs/prior Treatment) Patient is a 73 y.o. male presenting with abdominal pain. The history is provided by the patient.  Abdominal Pain He had onset this afternoon of sharp periumbilical pain without radiation. This is associated with nausea and vomiting. He denies constipation or diarrhea. He denies fever, chills, sweats. Pain was as severe as 7/10. Nothing made it worse. It was momentarily improve after vomiting. History of Crohn's disease and he is concerned that he has a recurrence of same.   Past Medical History  Diagnosis Date  . Hypertension   . Dilated cardiomyopathy     2/12: EF 30-35%, trivial AI, mild RAE.  EF 2014 40 -45%  . CAD (coronary artery disease)     LHC 9/05 with Dr. Einar Gip:  dLM 20-30%, LAD 85%, oD1 20-30%.  PCI:  Taxus DES to LAD; Dx jailed and tx with POBA.  Last myoview 12/10: inf scar, no ischemia, EF 29%.  . DVT (deep venous thrombosis)   . Pulmonary embolus     chronic coumadin  . Hyperhomocystinemia   . PFO (patent foramen ovale)     Not mentioned on 2014 echo.  [name] HLD (hyperlipidemia)   . Crohn's disease   . Nephrolithiasis   . Pneumonia ~ 2011  . Diabetes mellitus     11/05/11 "borderline; don't take medications"  . Stroke 1993    "left arm can't hold steady; leg too"  . Arthritis     "used to have a touch in my legs"   Past Surgical History  Procedure Laterality Date  . Colon surgery  1994; 1996    "for Crohn's disease"  . Appendectomy     No family history on file. History  Substance Use Topics  . Smoking status: Current Every Day Smoker -- 0.50 packs/day for 53 years    Types: Cigarettes  . Smokeless tobacco: Never Used  . Alcohol Use: No    Review of Systems  Gastrointestinal:  Positive for abdominal pain.  All other systems reviewed and are negative.      Allergies  Review of patient's allergies indicates no known allergies.  Home Medications   Current Outpatient Rx  Name  Route  Sig  Dispense  Refill  . carvedilol (COREG) 3.125 MG tablet   Oral   Take 1 tablet (3.125 mg total) by mouth 2 (two) times daily.   60 tablet   11   . latanoprost (XALATAN) 0.005 % ophthalmic solution   Both Eyes   Place 1 drop into both eyes at bedtime.         [name] lisinopril (PRINIVIL,ZESTRIL) 5 MG tablet   Oral   Take 0.5 tablets (2.5 mg total) by mouth daily.   30 tablet   11   . warfarin (COUMADIN) 5 MG tablet   Oral   Take 5 mg by mouth daily.          BP 135/66  Pulse 71  Temp(Src) 97.9 F (36.6 C) (Oral)  Resp 19  Ht 5' 9"  (1.753 m)  Wt 158 lb (71.668 kg)  BMI 23.32 kg/m2  SpO2 99% Physical Exam  Nursing note and vitals reviewed.  73 year old male, resting comfortably and in no acute distress. Vital signs are  normal . Oxygen  saturation is 99%, which is normal. Head is normocephalic and atraumatic. PERRLA, EOMI. Oropharynx is clear. Neck is nontender and supple without adenopathy or JVD. Back is nontender and there is no CVA tenderness. Lungs are clear without rales, wheezes, or rhonchi. Chest is nontender. Heart has regular rate and rhythm without murmur. Abdomen is soft, flat,  with a moderate periumbilical tenderness. There is no rebound or guarding. There are no  masses or hepatosplenomegaly and peristalsis is hypoactive. Extremities have no cyanosis or edema, full range of motion is present. Skin is warm and dry without rash. Neurologic: Mental status is normal, cranial nerves are intact, there are no motor or sensory deficits.  ED Course  Procedures (including critical care time) Labs Review Results for orders placed during the hospital encounter of 07/13/13  CBC WITH DIFFERENTIAL      Result Value Ref Range   WBC 10.2  4.0 - 10.5  K/uL   RBC 5.12  4.22 - 5.81 MIL/uL   Hemoglobin 16.3  13.0 - 17.0 g/dL   HCT 47.3  39.0 - 52.0 %   MCV 92.4  78.0 - 100.0 fL   MCH 31.8  26.0 - 34.0 pg   MCHC 34.5  30.0 - 36.0 g/dL   RDW 13.2  11.5 - 15.5 %   Platelets 160  150 - 400 K/uL   Neutrophils Relative % 86 (*) 43 - 77 %   Neutro Abs 8.7 (*) 1.7 - 7.7 K/uL   Lymphocytes Relative 10 (*) 12 - 46 %   Lymphs Abs 1.0  0.7 - 4.0 K/uL   Monocytes Relative 4  3 - 12 %   Monocytes Absolute 0.4  0.1 - 1.0 K/uL   Eosinophils Relative 0  0 - 5 %   Eosinophils Absolute 0.0  0.0 - 0.7 K/uL   Basophils Relative 0  0 - 1 %   Basophils Absolute 0.0  0.0 - 0.1 K/uL  COMPREHENSIVE METABOLIC PANEL      Result Value Ref Range   Sodium 140  137 - 147 mEq/L   Potassium 4.8  3.7 - 5.3 mEq/L   Chloride 102  96 - 112 mEq/L   CO2 23  19 - 32 mEq/L   Glucose, Bld 163 (*) 70 - 99 mg/dL   BUN 16  6 - 23 mg/dL   Creatinine, Ser 1.47 (*) 0.50 - 1.35 mg/dL   Calcium 8.7  8.4 - 10.5 mg/dL   Total Protein 7.5  6.0 - 8.3 g/dL   Albumin 3.3 (*) 3.5 - 5.2 g/dL   AST 13  0 - 37 U/L   ALT 9  0 - 53 U/L   Alkaline Phosphatase 118 (*) 39 - 117 U/L   Total Bilirubin 0.7  0.3 - 1.2 mg/dL   GFR calc non Af Amer 45 (*) >90 mL/min   GFR calc Af Amer 52 (*) >90 mL/min  LIPASE, BLOOD      Result Value Ref Range   Lipase 68 (*) 11 - 59 U/L  URINALYSIS, ROUTINE W REFLEX MICROSCOPIC      Result Value Ref Range   Color, Urine AMBER (*) YELLOW   APPearance CLOUDY (*) CLEAR   Specific Gravity, Urine 1.027  1.005 - 1.030   pH 5.5  5.0 - 8.0   Glucose, UA 100 (*) NEGATIVE mg/dL   Hgb urine dipstick MODERATE (*) NEGATIVE   Bilirubin Urine SMALL (*) NEGATIVE   Ketones, ur 15 (*) NEGATIVE mg/dL   Protein, ur 100 (*)  NEGATIVE mg/dL   Urobilinogen, UA 0.2  0.0 - 1.0 mg/dL   Nitrite NEGATIVE  NEGATIVE   Leukocytes, UA TRACE (*) NEGATIVE  URINE MICROSCOPIC-ADD ON      Result Value Ref Range   Squamous Epithelial / LPF FEW (*) RARE   WBC, UA 3-6  <3 WBC/hpf    RBC / HPF 21-50  <3 RBC/hpf   Bacteria, UA FEW (*) RARE   Casts GRANULAR CAST (*) NEGATIVE   Crystals CA OXALATE CRYSTALS (*) NEGATIVE   Sperm, UA PRESENT     Urine-Other MUCOUS PRESENT    PROTIME-INR      Result Value Ref Range   Prothrombin Time 26.7 (*) 11.6 - 15.2 seconds   INR 2.57 (*) 0.00 - 1.49  I-STAT TROPOININ, ED      Result Value Ref Range   Troponin i, poc 0.01  0.00 - 0.08 ng/mL   Comment 3           I-STAT CG4 LACTIC ACID, ED      Result Value Ref Range   Lactic Acid, Venous 1.32  0.5 - 2.2 mmol/L   Imaging Review Ct Abdomen Pelvis W Contrast  07/13/2013   CLINICAL DATA:  Mid abdominal pain with nausea and vomiting.  EXAM: CT ABDOMEN AND PELVIS WITH CONTRAST  TECHNIQUE: Multidetector CT imaging of the abdomen and pelvis was performed using the standard protocol following bolus administration of intravenous contrast.  CONTRAST:  140m OMNIPAQUE IOHEXOL 300 MG/ML  SOLN  COMPARISON:  DG ABD 1 VIEW dated 11/06/2011; CT CTA ABD/PEL W/CM AND/OR W/O CM dated 11/05/2011; CT ABD/PELV WO CM dated 01/24/2011  FINDINGS: Included view of the lung a bases demonstrates dependent atelectasis. The heart appears upper limits of normal in size, mediastinal silhouette is nonsuspicious.  Subcentimeter hypodensity in left lobe of the liver likely reflects a cyst, the liver is otherwise unremarkable. Spleen, pancreas, adrenal glands are unremarkable. Subcentimeter gallstone.  Dilated small bowel up to 4.5 cm with transition point in the right lower quadrant associated with a small bowel surgical anastomosis, axial 54/89. Immediately above the anastomosis is circumferential wall thickening, coronal 28/76. Status post right hemicolectomy. Mild small bowel feces. Colonic diverticulosis with mild wall thickening, no superimposed inflammatory changes. No intraperitoneal free fluid or free air.  2 mm nonobstructing right interpolar renal calculus. 4 mm right interpolar renal calculus. Multiple bilateral renal  cysts measuring up to 3.7 cm in the left interpolar kidney. Additional bilateral too small to characterize hypodensities. Urinary bladder is partially distended and unremarkable. Prostate is not enlarged. Aortoiliac vessels are normal in course and caliber with mild to moderate calcific atherosclerosis, narrowing and of possible high-grade stenosis of the external iliac arteries not tailored for evaluation. No lymphadenopathy by CT size criteria. Subcentimeter mesenteric lymph nodes seen.  Moderate degenerate change the hips. Osseous structures are nonsuspicious.  IMPRESSION: Partial versus early small bowel obstruction with transition point immediately above surgical bowel anastomosis, status post right hemicolectomy. In addition, there is thickened appearance of the bowel immediately above the anastomosis and though this may be postoperative, recurrent disease may have this appearance. Recommend follow-up.  Nonobstructing right nephrolithiasis measure up to 4 mm. Cholelithiasis without cholecystitis.   Electronically Signed   By: CElon Alas  On: 07/13/2013 04:30   Images viewed by me. MDM   Final diagnoses:  Small bowel obstruction  Renal insufficiency  Elevated lipase    Abdominal pain of uncertain cause. Old records are reviewed and he had been admitted  2 years ago with a small bowel obstruction. Presentation at that time was very similar to today. Initial laboratory workup shows mildly elevated lipase of uncertain significance. Creatinine is mildly elevated but at his baseline. He'll be sent for CT of the abdomen and pelvis to clarify.   CT confirms partial small bowel obstruction. Case is discussed with Dr. Alcario Drought [facility] who agrees to admit the patient.  Delora Fuel, MD 38/88/28 0034

## 2013-07-13 NOTE — ED Notes (Signed)
IV fluids paused ( for 31mns) per request of Lab. Tech. Pt is a difficult stick.

## 2013-07-13 NOTE — H&P (Signed)
[facility] History and Physical  Grant Lara WCB:[id number] DOB: 12/11/1940 DOA: 07/13/2013  Referring physician: EDP PCP: Lynne Logan, MD   Chief Complaint: Abdominal pain, N/V   HPI: Grant Lara is a 73 y.o. male who presents to the ED with abdominal pain, N/V onset this afternoon.  Pain is located in the middle of his abdomen, periumbilical without radiation.  No constipation nor diarrhea.  Nothing has made it worse, will momentarily improve after vomiting.  He has a history of crohn's disease, had surgeries for this in 48 and 96.  Has a history of SBO in the past as well, last episode of SBO was in 2013.  Work up in the ED demonstrates early SBO or PSBO on CT abd/pelvis.  Review of Systems: Systems reviewed.  As above, otherwise negative  Past Medical History  Diagnosis Date  . Hypertension   . Dilated cardiomyopathy     2/12: EF 30-35%, trivial AI, mild RAE.  EF 2014 40 -45%  . CAD (coronary artery disease)     LHC 9/05 with Dr. Einar Gip:  dLM 20-30%, LAD 85%, oD1 20-30%.  PCI:  Taxus DES to LAD; Dx jailed and tx with POBA.  Last myoview 12/10: inf scar, no ischemia, EF 29%.  . DVT (deep venous thrombosis)   . Pulmonary embolus     chronic coumadin  . Hyperhomocystinemia   . PFO (patent foramen ovale)     Not mentioned on 2014 echo.  [name] HLD (hyperlipidemia)   . Crohn's disease   . Nephrolithiasis   . Pneumonia ~ 2011  . Diabetes mellitus     11/05/11 "borderline; don't take medications"  . Stroke 1993    "left arm can't hold steady; leg too"  . Arthritis     "used to have a touch in my legs"   Past Surgical History  Procedure Laterality Date  . Colon surgery  1994; 1996    "for Crohn's disease"  . Appendectomy     Social History:  reports that he has been smoking Cigarettes.  He has a 26.5 pack-year smoking history. He has never used smokeless tobacco. He reports that he does not drink alcohol or use illicit drugs.  No Known Allergies  No family history on  file.   Prior to Admission medications   Medication Sig Start Date End Date Taking? Authorizing Provider  carvedilol (COREG) 3.125 MG tablet Take 1 tablet (3.125 mg total) by mouth 2 (two) times daily. 10/20/12 10/20/13 Yes Minus Breeding, MD  latanoprost (XALATAN) 0.005 % ophthalmic solution Place 1 drop into both eyes at bedtime. 06/29/13  Yes Historical Provider, MD  lisinopril (PRINIVIL,ZESTRIL) 5 MG tablet Take 0.5 tablets (2.5 mg total) by mouth daily. 10/20/12 10/20/13 Yes Minus Breeding, MD  warfarin (COUMADIN) 5 MG tablet Take 5 mg by mouth daily.   Yes Historical Provider, MD   Physical Exam: Filed Vitals:   07/13/13 0326  BP: 125/65  Pulse: 74  Temp: 98.1 F (36.7 C)  Resp: 12    BP 125/65  Pulse 74  Temp(Src) 98.1 F (36.7 C) (Oral)  Resp 12  Ht 5' 9"  (1.753 m)  Wt 71.668 kg (158 lb)  BMI 23.32 kg/m2  SpO2 98%  General Appearance:    Alert, oriented, no distress, appears stated age  Head:    Normocephalic, atraumatic  Eyes:    PERRL, EOMI, sclera non-icteric        Nose:   Nares without drainage or epistaxis. Mucosa, turbinates normal  Throat:   Moist mucous membranes. Oropharynx without erythema or exudate.  Neck:   Supple. No carotid bruits.  No thyromegaly.  No lymphadenopathy.   Back:     No CVA tenderness, no spinal tenderness  Lungs:     Clear to auscultation bilaterally, without wheezes, rhonchi or rales  Chest wall:    No tenderness to palpitation  Heart:    Regular rate and rhythm without murmurs, gallops, rubs  Abdomen:     Soft, non-tender, nondistended, normal bowel sounds, no organomegaly  Genitalia:    deferred  Rectal:    deferred  Extremities:   No clubbing, cyanosis or edema.  Pulses:   2+ and symmetric all extremities  Skin:   Skin color, texture, turgor normal, no rashes or lesions  Lymph nodes:   Cervical, supraclavicular, and axillary nodes normal  Neurologic:   CNII-XII intact. Normal strength, sensation and reflexes      throughout    Labs  on Admission:  Basic Metabolic Panel:  Recent Labs Lab 07/13/13 0049  NA 140  K 4.8  CL 102  CO2 23  GLUCOSE 163*  BUN 16  CREATININE 1.47*  CALCIUM 8.7   Liver Function Tests:  Recent Labs Lab 07/13/13 0049  AST 13  ALT 9  ALKPHOS 118*  BILITOT 0.7  PROT 7.5  ALBUMIN 3.3*    Recent Labs Lab 07/13/13 0049  LIPASE 68*   No results found for this basename: AMMONIA,  in the last 168 hours CBC:  Recent Labs Lab 07/13/13 0049  WBC 10.2  NEUTROABS 8.7*  HGB 16.3  HCT 47.3  MCV 92.4  PLT 160   Cardiac Enzymes: No results found for this basename: CKTOTAL, CKMB, CKMBINDEX, TROPONINI,  in the last 168 hours  BNP (last 3 results) No results found for this basename: PROBNP,  in the last 8760 hours CBG: No results found for this basename: GLUCAP,  in the last 168 hours  Radiological Exams on Admission: Ct Abdomen Pelvis W Contrast  07/13/2013   CLINICAL DATA:  Mid abdominal pain with nausea and vomiting.  EXAM: CT ABDOMEN AND PELVIS WITH CONTRAST  TECHNIQUE: Multidetector CT imaging of the abdomen and pelvis was performed using the standard protocol following bolus administration of intravenous contrast.  CONTRAST:  115m OMNIPAQUE IOHEXOL 300 MG/ML  SOLN  COMPARISON:  DG ABD 1 VIEW dated 11/06/2011; CT CTA ABD/PEL W/CM AND/OR W/O CM dated 11/05/2011; CT ABD/PELV WO CM dated 01/24/2011  FINDINGS: Included view of the lung a bases demonstrates dependent atelectasis. The heart appears upper limits of normal in size, mediastinal silhouette is nonsuspicious.  Subcentimeter hypodensity in left lobe of the liver likely reflects a cyst, the liver is otherwise unremarkable. Spleen, pancreas, adrenal glands are unremarkable. Subcentimeter gallstone.  Dilated small bowel up to 4.5 cm with transition point in the right lower quadrant associated with a small bowel surgical anastomosis, axial 54/89. Immediately above the anastomosis is circumferential wall thickening, coronal 28/76.  Status post right hemicolectomy. Mild small bowel feces. Colonic diverticulosis with mild wall thickening, no superimposed inflammatory changes. No intraperitoneal free fluid or free air.  2 mm nonobstructing right interpolar renal calculus. 4 mm right interpolar renal calculus. Multiple bilateral renal cysts measuring up to 3.7 cm in the left interpolar kidney. Additional bilateral too small to characterize hypodensities. Urinary bladder is partially distended and unremarkable. Prostate is not enlarged. Aortoiliac vessels are normal in course and caliber with mild to moderate calcific atherosclerosis, narrowing and of possible high-grade stenosis  of the external iliac arteries not tailored for evaluation. No lymphadenopathy by CT size criteria. Subcentimeter mesenteric lymph nodes seen.  Moderate degenerate change the hips. Osseous structures are nonsuspicious.  IMPRESSION: Partial versus early small bowel obstruction with transition point immediately above surgical bowel anastomosis, status post right hemicolectomy. In addition, there is thickened appearance of the bowel immediately above the anastomosis and though this may be postoperative, recurrent disease may have this appearance. Recommend follow-up.  Nonobstructing right nephrolithiasis measure up to 4 mm. Cholelithiasis without cholecystitis.   Electronically Signed   By: Elon Alas   On: 07/13/2013 04:30    EKG: Independently reviewed.  Assessment/Plan Principal Problem:   SBO (small bowel obstruction) Active Problems:   Small bowel obstruction   1. SBO - early SBO vs PSBO, patient NPO, on IVF, holding off on NGT for now.  Recurrence of crohn's disease causing his SBO / PSBO is also in the differential, however would prefer to treat as a routine SBO first, if this does not resolve then would consider starting steroids for possible Crohn's recurrence as the evidence for Crohn's recurrence is very vague at this point.    Code Status:  Full Code  Family Communication: Family at bedside Disposition Plan: Admit to inpatient   Time spent: 70 min  Gaetan Spieker M. [facility] Pager [phone]  If 7AM-7PM, please contact the day team taking care of the patient [email or web address] Password [facility] 07/13/2013, 5:17 AM

## 2013-07-13 NOTE — ED Notes (Signed)
Spoke with wife and informed her of room assignment

## 2013-07-13 NOTE — Progress Notes (Signed)
Patient Demographics  Grant Lara, is a 73 y.o. male, DOB - 12/01/1940, VHQ:[id number]  Admit date - 07/13/2013   Admitting Physician Etta Quill, DO  Outpatient Primary MD for the patient is Lynne Logan, MD  LOS - 0   Chief Complaint  Patient presents with  . Abdominal Pain        Assessment & Plan    1. SBO - at the previous surgical anastomosis in a patient with history of Crohn's colitis. Could represent Crohn's flareup, currently supportive care with bowel rest, NG tube, IV fluids, pain and nausea control. General surgery following. GI has also been consulted.   2. History of Crohn's colitis. Currently in remission not on any medications, follows with either GI who have been reconsult.    3. 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 With history of PFO. Stable, currently compensated from a cardiac standpoint, IV fluids at a low rate with close monitoring of cardiopulmonary status clinically. As needed IV Lopressor and hydralazine for blood pressure control.    4. 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 Due to #1 above Coumadin on hold, will monitor INR daily.    5.HTN - As needed IV Lopressor and hydralazine for blood pressure control.      Code Status: Full  Family Communication:   Disposition Plan: Home   Procedures  CT Abdomen-pelvis   Consults  GI, CCS   Medications  Scheduled Meds: . carvedilol  3.125 mg Oral BID WC  . latanoprost  1 drop Both Eyes QHS  . lisinopril  2.5 mg Oral Daily   Continuous Infusions: . sodium chloride     PRN Meds:.butamben-tetracaine-benzocaine, HYDROmorphone (DILAUDID) injection, metoprolol, morphine injection, ondansetron (ZOFRAN) IV, phenol  DVT Prophylaxis  Couamdin - SCDs      Lab Results  Component Value Date   INR 2.57* 07/13/2013   INR 3.1 07/01/2013   INR 3.0 05/27/2013     Lab Results  Component Value Date   PLT 160 07/13/2013    Antibiotics   Anti-infectives   None          Subjective:   Govanni Lara today has, No headache, No chest pain, improved  abdominal pain - No Nausea, No new weakness tingling or numbness, No Cough - SOB.    Objective:   Filed Vitals:   07/13/13 0245 07/13/13 0326 07/13/13 0612 07/13/13 1018  BP: 140/69 125/65 163/76 160/78  Pulse: 84 74 50 56  Temp:  98.1 F (36.7 C) 97.9 F (36.6 C) 97.5 F (36.4 C)  TempSrc:  Oral Oral Oral  Resp: 15 12 18 18   Height:      Weight:      SpO2: 100% 98% 99% 99%    Wt Readings from Last 3 Encounters:  07/12/13 71.668 kg (158 lb)  10/20/12 73.029 kg (161 lb)  05/11/12 70.308 kg (155 lb)     Intake/Output Summary (Last 24 hours) at 07/13/13 1142 Last data filed at 07/13/13 0900  Gross per 24 hour  Intake 176.67 ml  Output    140 ml  Net  36.67 ml     Physical Exam  Awake Alert, Oriented X 3, No new F.N  deficits, Normal affect [place].AT,PERRAL Supple Neck,No JVD, No cervical lymphadenopathy appriciated.  Symmetrical Chest wall movement, Good air movement bilaterally, CTAB RRR,No Gallops,Rubs or new Murmurs, No Parasternal Heave +ve B.Sounds, Abd Soft, Non tender, No organomegaly appriciated, No rebound - guarding or rigidity. NG in place No Cyanosis, Clubbing or edema, No new Rash or bruise      Data Review   Micro Results No results found for this or any previous visit (from the past 240 hour(s)).  Radiology Reports Ct Abdomen Pelvis W Contrast  07/13/2013   CLINICAL DATA:  Mid abdominal pain with nausea and vomiting.  EXAM: CT ABDOMEN AND PELVIS WITH CONTRAST  TECHNIQUE: Multidetector CT imaging of the abdomen and pelvis was performed using the standard protocol following bolus administration of intravenous contrast.  CONTRAST:  178m OMNIPAQUE IOHEXOL 300  MG/ML  SOLN  COMPARISON:  DG ABD 1 VIEW dated 11/06/2011; CT CTA ABD/PEL W/CM AND/OR W/O CM dated 11/05/2011; CT ABD/PELV WO CM dated 01/24/2011  FINDINGS: Included view of the lung a bases demonstrates dependent atelectasis. The heart appears upper limits of normal in size, mediastinal silhouette is nonsuspicious.  Subcentimeter hypodensity in left lobe of the liver likely reflects a cyst, the liver is otherwise unremarkable. Spleen, pancreas, adrenal glands are unremarkable. Subcentimeter gallstone.  Dilated small bowel up to 4.5 cm with transition point in the right lower quadrant associated with a small bowel surgical anastomosis, axial 54/89. Immediately above the anastomosis is circumferential wall thickening, coronal 28/76. Status post right hemicolectomy. Mild small bowel feces. Colonic diverticulosis with mild wall thickening, no superimposed inflammatory changes. No intraperitoneal free fluid or free air.  2 mm nonobstructing right interpolar renal calculus. 4 mm right interpolar renal calculus. Multiple bilateral renal cysts measuring up to 3.7 cm in the left interpolar kidney. Additional bilateral too small to characterize hypodensities. Urinary bladder is partially distended and unremarkable. Prostate is not enlarged. Aortoiliac vessels are normal in course and caliber with mild to moderate calcific atherosclerosis, narrowing and of possible high-grade stenosis of the external iliac arteries not tailored for evaluation. No lymphadenopathy by CT size criteria. Subcentimeter mesenteric lymph nodes seen.  Moderate degenerate change the hips. Osseous structures are nonsuspicious.  IMPRESSION: Partial versus early small bowel obstruction with transition point immediately above surgical bowel anastomosis, status post right hemicolectomy. In addition, there is thickened appearance of the bowel immediately above the anastomosis and though this may be postoperative, recurrent disease may have this appearance.  Recommend follow-up.  Nonobstructing right nephrolithiasis measure up to 4 mm. Cholelithiasis without cholecystitis.   Electronically Signed   By: CElon Alas  On: 07/13/2013 04:30   Dg Abd Portable 1v  07/13/2013   CLINICAL DATA:  Assess Nasogastric tube placement.  EXAM: PORTABLE ABDOMEN - 1 VIEW  COMPARISON:  None.  FINDINGS: The esophagogastric tube tip and proximal port lie well below the expected location of the GE junction. There are loops of mildly distended gas-filled small bowel in the right mid abdomen. There is stool and gas and contrast in the descending colon. No free extraluminal gas collections are demonstrated. The lung bases exhibit no alveolar infiltrates or pleural effusions.  IMPRESSION: 1. The positioning of the nasogastric tube is nasal is radiographically good with the proximal port and tip lying well below the expected location of the GE junction. 2. The bowel gas pattern may reflect a partial small bowel obstruction.   Electronically Signed   By: David  JMartinique  On: 07/13/2013 10:30  CBC  Recent Labs Lab 07/13/13 0049  WBC 10.2  HGB 16.3  HCT 47.3  PLT 160  MCV 92.4  MCH 31.8  MCHC 34.5  RDW 13.2  LYMPHSABS 1.0  MONOABS 0.4  EOSABS 0.0  BASOSABS 0.0    Chemistries   Recent Labs Lab 07/13/13 0049  NA 140  K 4.8  CL 102  CO2 23  GLUCOSE 163*  BUN 16  CREATININE 1.47*  CALCIUM 8.7  AST 13  ALT 9  ALKPHOS 118*  BILITOT 0.7   ------------------------------------------------------------------------------------------------------------------ estimated creatinine clearance is 44.1 ml/min (by C-G formula based on Cr of 1.47). ------------------------------------------------------------------------------------------------------------------ No results found for this basename: HGBA1C,  in the last 72 hours ------------------------------------------------------------------------------------------------------------------ No results found for this  basename: CHOL, HDL, LDLCALC, TRIG, CHOLHDL, LDLDIRECT,  in the last 72 hours ------------------------------------------------------------------------------------------------------------------ No results found for this basename: TSH, T4TOTAL, FREET3, T3FREE, THYROIDAB,  in the last 72 hours ------------------------------------------------------------------------------------------------------------------ No results found for this basename: VITAMINB12, FOLATE, FERRITIN, TIBC, IRON, RETICCTPCT,  in the last 72 hours  Coagulation profile  Recent Labs Lab 07/13/13 0049  INR 2.57*    No results found for this basename: DDIMER,  in the last 72 hours  Cardiac Enzymes No results found for this basename: CK, CKMB, TROPONINI, MYOGLOBIN,  in the last 168 hours ------------------------------------------------------------------------------------------------------------------ No components found with this basename: POCBNP,      Time Spent in minutes   35   Kaila Devries K M.D on 07/13/2013 at 11:42 AM  Between 7am to 7pm - Pager - [phone]  After 7pm go to [email or web address] - password [email or web address]  And look for the night coverage person covering for me after hours  [facility] Office  [phone]

## 2013-07-13 NOTE — Consult Note (Signed)
Reason for Consult:  Recurrent SBO with hx of Crohn's disease and SB or colon resections in 1994, and 1996 Referring Physician: Dr. Candiss Norse PCP:  Lynne Logan, MD Gi:  DR. Leory Plowman Ulatowski is an 73 y.o. male.  HPI: 73 y/o with hospitalizations in July 2013 and Oct 2012 with SBO, the one on 2012 was treated with steroids, bowel rest, NG decompression and hydration.  In 2013 it looks like they did not use any steroids, just medical management.  He has not seen Dr. Paulita Fujita and has not been on anything for Crohn's for some years.  He was doing fine till yesterday evening he started having acute abdominal pain, nausea and vomiting.  He has been afebrile, Creatinine is up lipase some and Alk phos is up some.  WBC was normal, but had a left shift.  CT shows partial vs early SBO with transition point above surgical anastomosis. Also some thickend appearance above the anastomosis. There is some possible recurrent disease. It says he had a hemicolectomy, but we don't have any records to verify this and he did not remember 2 years ago.   Currently he is on the floor with NG in place, not much coming out what is in the tube is rather thick.   We are ask to see.    Past Medical History  Diagnosis Date  . Hypertension   . Dilated cardiomyopathy     2/12: EF 30-35%, trivial AI, mild RAE.  EF 2014 40 -45%  . CAD (coronary artery disease)     LHC 9/05 with Dr. Einar Gip:  dLM 20-30%, LAD 85%, oD1 20-30%.  PCI:  Taxus DES to LAD; Dx jailed and tx with POBA.  Last myoview 12/10: inf scar, no ischemia, EF 29%.  . DVT (deep venous thrombosis)   . Pulmonary embolus     chronic coumadin  . Hyperhomocystinemia   . PFO (patent foramen ovale)     Not mentioned on 2014 echo.  [name] HLD (hyperlipidemia)   . Crohn's disease   . Nephrolithiasis   . Pneumonia ~ 2011  . Diabetes mellitus     11/05/11 "borderline; don't take medications"  . Stroke 1993    "left arm can't hold steady; leg too"  . Arthritis     "used to have  a touch in my legs"    Past Surgical History  Procedure Laterality Date  . Colon surgery  1994; 1996    "for Crohn's disease"  . Appendectomy      No family history on file.  Social History:  reports that he has been smoking Cigarettes.  He has a 26.5 pack-year smoking history. He has never used smokeless tobacco. He reports that he does not drink alcohol or use illicit drugs.  Allergies: No Known Allergies  Medications:  Prior to Admission:  Prescriptions prior to admission  Medication Sig Dispense Refill  . carvedilol (COREG) 3.125 MG tablet Take 1 tablet (3.125 mg total) by mouth 2 (two) times daily.  60 tablet  11  . latanoprost (XALATAN) 0.005 % ophthalmic solution Place 1 drop into both eyes at bedtime.      [name] lisinopril (PRINIVIL,ZESTRIL) 5 MG tablet Take 0.5 tablets (2.5 mg total) by mouth daily.  30 tablet  11  . warfarin (COUMADIN) 5 MG tablet Take 5 mg by mouth daily.       Scheduled: . sodium chloride   Intravenous STAT  . carvedilol  3.125 mg Oral BID WC  . latanoprost  1 drop Both Eyes QHS  . lisinopril  2.5 mg Oral Daily   Continuous: . sodium chloride 100 mL/hr at 07/13/13 2330   QTM:[id number] (DILAUDID) injection, morphine injection, ondansetron (ZOFRAN) IV Anti-infectives   None      Results for orders placed during the hospital encounter of 07/13/13 (from the past 48 hour(s))  CBC WITH DIFFERENTIAL     Status: Abnormal   Collection Time    07/13/13 12:49 AM      Result Value Ref Range   WBC 10.2  4.0 - 10.5 K/uL   RBC 5.12  4.22 - 5.81 MIL/uL   Hemoglobin 16.3  13.0 - 17.0 g/dL   HCT 47.3  39.0 - 52.0 %   MCV 92.4  78.0 - 100.0 fL   MCH 31.8  26.0 - 34.0 pg   MCHC 34.5  30.0 - 36.0 g/dL   RDW 13.2  11.5 - 15.5 %   Platelets 160  150 - 400 K/uL   Neutrophils Relative % 86 (*) 43 - 77 %   Neutro Abs 8.7 (*) 1.7 - 7.7 K/uL   Lymphocytes Relative 10 (*) 12 - 46 %   Lymphs Abs 1.0  0.7 - 4.0 K/uL   Monocytes Relative 4  3 - 12 %   Monocytes  Absolute 0.4  0.1 - 1.0 K/uL   Eosinophils Relative 0  0 - 5 %   Eosinophils Absolute 0.0  0.0 - 0.7 K/uL   Basophils Relative 0  0 - 1 %   Basophils Absolute 0.0  0.0 - 0.1 K/uL  COMPREHENSIVE METABOLIC PANEL     Status: Abnormal   Collection Time    07/13/13 12:49 AM      Result Value Ref Range   Sodium 140  137 - 147 mEq/L   Potassium 4.8  3.7 - 5.3 mEq/L   Chloride 102  96 - 112 mEq/L   CO2 23  19 - 32 mEq/L   Glucose, Bld 163 (*) 70 - 99 mg/dL   BUN 16  6 - 23 mg/dL   Creatinine, Ser 1.47 (*) 0.50 - 1.35 mg/dL   Calcium 8.7  8.4 - 10.5 mg/dL   Total Protein 7.5  6.0 - 8.3 g/dL   Albumin 3.3 (*) 3.5 - 5.2 g/dL   AST 13  0 - 37 U/L   ALT 9  0 - 53 U/L   Alkaline Phosphatase 118 (*) 39 - 117 U/L   Total Bilirubin 0.7  0.3 - 1.2 mg/dL   GFR calc non Af Amer 45 (*) >90 mL/min   GFR calc Af Amer 52 (*) >90 mL/min   Comment: (NOTE)     The eGFR has been calculated using the CKD EPI equation.     This calculation has not been validated in all clinical situations.     eGFR's persistently <90 mL/min signify possible Chronic Kidney     Disease.  LIPASE, BLOOD     Status: Abnormal   Collection Time    07/13/13 12:49 AM      Result Value Ref Range   Lipase 68 (*) 11 - 59 U/L  PROTIME-INR     Status: Abnormal   Collection Time    07/13/13 12:49 AM      Result Value Ref Range   Prothrombin Time 26.7 (*) 11.6 - 15.2 seconds   INR 2.57 (*) 0.00 - 1.49  I-STAT TROPOININ, ED     Status: None   Collection Time    07/13/13  12:56 AM      Result Value Ref Range   Troponin i, poc 0.01  0.00 - 0.08 ng/mL   Comment 3            Comment: Due to the release kinetics of cTnI,     a negative result within the first hours     of the onset of symptoms does not rule out     myocardial infarction with certainty.     If myocardial infarction is still suspected,     repeat the test at appropriate intervals.  URINALYSIS, ROUTINE W REFLEX MICROSCOPIC     Status: Abnormal   Collection Time     07/13/13  1:02 AM      Result Value Ref Range   Color, Urine AMBER (*) YELLOW   Comment: BIOCHEMICALS MAY BE AFFECTED BY COLOR   APPearance CLOUDY (*) CLEAR   Specific Gravity, Urine 1.027  1.005 - 1.030   pH 5.5  5.0 - 8.0   Glucose, UA 100 (*) NEGATIVE mg/dL   Hgb urine dipstick MODERATE (*) NEGATIVE   Bilirubin Urine SMALL (*) NEGATIVE   Ketones, ur 15 (*) NEGATIVE mg/dL   Protein, ur 100 (*) NEGATIVE mg/dL   Urobilinogen, UA 0.2  0.0 - 1.0 mg/dL   Nitrite NEGATIVE  NEGATIVE   Leukocytes, UA TRACE (*) NEGATIVE  URINE MICROSCOPIC-ADD ON     Status: Abnormal   Collection Time    07/13/13  1:02 AM      Result Value Ref Range   Squamous Epithelial / LPF FEW (*) RARE   WBC, UA 3-6  <3 WBC/hpf   RBC / HPF 21-50  <3 RBC/hpf   Bacteria, UA FEW (*) RARE   Casts GRANULAR CAST (*) NEGATIVE   Comment: HYALINE CASTS   Crystals CA OXALATE CRYSTALS (*) NEGATIVE   Sperm, UA PRESENT     Urine-Other MUCOUS PRESENT    I-STAT CG4 LACTIC ACID, ED     Status: None   Collection Time    07/13/13  3:20 AM      Result Value Ref Range   Lactic Acid, Venous 1.32  0.5 - 2.2 mmol/L    Ct Abdomen Pelvis W Contrast  07/13/2013   CLINICAL DATA:  Mid abdominal pain with nausea and vomiting.  EXAM: CT ABDOMEN AND PELVIS WITH CONTRAST  TECHNIQUE: Multidetector CT imaging of the abdomen and pelvis was performed using the standard protocol following bolus administration of intravenous contrast.  CONTRAST:  159m OMNIPAQUE IOHEXOL 300 MG/ML  SOLN  COMPARISON:  DG ABD 1 VIEW dated 11/06/2011; CT CTA ABD/PEL W/CM AND/OR W/O CM dated 11/05/2011; CT ABD/PELV WO CM dated 01/24/2011  FINDINGS: Included view of the lung a bases demonstrates dependent atelectasis. The heart appears upper limits of normal in size, mediastinal silhouette is nonsuspicious.  Subcentimeter hypodensity in left lobe of the liver likely reflects a cyst, the liver is otherwise unremarkable. Spleen, pancreas, adrenal glands are unremarkable.  Subcentimeter gallstone.  Dilated small bowel up to 4.5 cm with transition point in the right lower quadrant associated with a small bowel surgical anastomosis, axial 54/89. Immediately above the anastomosis is circumferential wall thickening, coronal 28/76. Status post right hemicolectomy. Mild small bowel feces. Colonic diverticulosis with mild wall thickening, no superimposed inflammatory changes. No intraperitoneal free fluid or free air.  2 mm nonobstructing right interpolar renal calculus. 4 mm right interpolar renal calculus. Multiple bilateral renal cysts measuring up to 3.7 cm in the left interpolar kidney. Additional bilateral  too small to characterize hypodensities. Urinary bladder is partially distended and unremarkable. Prostate is not enlarged. Aortoiliac vessels are normal in course and caliber with mild to moderate calcific atherosclerosis, narrowing and of possible high-grade stenosis of the external iliac arteries not tailored for evaluation. No lymphadenopathy by CT size criteria. Subcentimeter mesenteric lymph nodes seen.  Moderate degenerate change the hips. Osseous structures are nonsuspicious.  IMPRESSION: Partial versus early small bowel obstruction with transition point immediately above surgical bowel anastomosis, status post right hemicolectomy. In addition, there is thickened appearance of the bowel immediately above the anastomosis and though this may be postoperative, recurrent disease may have this appearance. Recommend follow-up.  Nonobstructing right nephrolithiasis measure up to 4 mm. Cholelithiasis without cholecystitis.   Electronically Signed   By: Elon Alas   On: 07/13/2013 04:30    Review of Systems  Constitutional: Negative.   HENT: Negative.   Eyes: Negative.   Respiratory: Negative.   Cardiovascular: Negative.   Gastrointestinal: Positive for nausea and vomiting.       Nausea and vomiting started last PM BM yesterday  Genitourinary: Negative.    Musculoskeletal: Negative.   Skin: Negative.   Neurological: Negative.   Endo/Heme/Allergies: Negative.   Psychiatric/Behavioral: Negative.    Blood pressure 163/76, pulse 50, temperature 97.9 F (36.6 C), temperature source Oral, resp. rate 18, height _0  (1.753 m), weight 71.668 kg (158 lb), SpO2 99.00%. Physical Exam  Constitutional: He is oriented to person, place, and time. No distress.  Thin male with NG in place and no distress  HENT:  Head: Normocephalic and atraumatic.  Nose: Nose normal.  Eyes: Conjunctivae and EOM are normal. Pupils are equal, round, and reactive to light. Right eye exhibits no discharge. Left eye exhibits no discharge. No scleral icterus.  Neck: Normal range of motion. Neck supple. No JVD present. No tracheal deviation present. No thyromegaly present.  Cardiovascular: Normal rate, regular rhythm, normal heart sounds and intact distal pulses.  Exam reveals no gallop.   No murmur heard. Respiratory: Effort normal and breath sounds normal. No respiratory distress. He has no wheezes (minimal if any distension). He has no rales. He exhibits no tenderness.  GI: Soft. He exhibits distension. He exhibits no mass. There is no tenderness. There is no rebound and no guarding.  BS hyperactive  Musculoskeletal: He exhibits edema (trace). He exhibits no tenderness.  Lymphadenopathy:    He has no cervical adenopathy.  Neurological: He is alert and oriented to person, place, and time. No cranial nerve deficit.  Skin: Skin is warm and dry. No rash noted. He is not diaphoretic. No erythema. No pallor.  Psychiatric: He has a normal mood and affect. His behavior is normal. Judgment and thought content normal.    Assessment/Plan: Recurrent SBO with hx of Crohn's and small bowel or colon resection in 1994, and 1996. Hospitalized 01/2011, and 10/2011 with SBO Hx of Crohn's with no treatment for some years. 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  I would ask Dr. Paulita Fujita to see if he thinks he would benefit from steroids, but medical management for now.  Film shows NG in place.  Concettina Leth 07/13/2013, 10:04 AM

## 2013-07-13 NOTE — Progress Notes (Signed)
UR complete.  Blanca Thornton RN, MSN 

## 2013-07-13 NOTE — Consult Note (Signed)
Agree with above 

## 2013-07-14 ENCOUNTER — Inpatient Hospital Stay (HOSPITAL_COMMUNITY): Payer: Medicare Other

## 2013-07-14 LAB — COMPREHENSIVE METABOLIC PANEL
ALBUMIN: 2.8 g/dL — AB (ref 3.5–5.2)
ALK PHOS: 108 U/L (ref 39–117)
ALT: 8 U/L (ref 0–53)
AST: 13 U/L (ref 0–37)
BILIRUBIN TOTAL: 1.3 mg/dL — AB (ref 0.3–1.2)
BUN: 13 mg/dL (ref 6–23)
CHLORIDE: 101 meq/L (ref 96–112)
CO2: 24 meq/L (ref 19–32)
CREATININE: 1.17 mg/dL (ref 0.50–1.35)
Calcium: 8.7 mg/dL (ref 8.4–10.5)
GFR calc Af Amer: 69 mL/min — ABNORMAL LOW (ref >90)
GFR, EST NON AFRICAN AMERICAN: 60 mL/min — AB (ref >90)
Glucose, Bld: 103 mg/dL — ABNORMAL HIGH (ref 70–99)
POTASSIUM: 3.9 meq/L (ref 3.7–5.3)
Sodium: 141 mEq/L (ref 137–147)
Total Protein: 6.5 g/dL (ref 6.0–8.3)

## 2013-07-14 LAB — GLUCOSE, CAPILLARY
GLUCOSE-CAPILLARY: 104 mg/dL — AB (ref 70–99)
GLUCOSE-CAPILLARY: 125 mg/dL — AB (ref 70–99)
GLUCOSE-CAPILLARY: 132 mg/dL — AB (ref 70–99)
GLUCOSE-CAPILLARY: 161 mg/dL — AB (ref 70–99)
GLUCOSE-CAPILLARY: 91 mg/dL (ref 70–99)
Glucose-Capillary: 164 mg/dL — ABNORMAL HIGH (ref 70–99)

## 2013-07-14 LAB — CBC
HEMATOCRIT: 43.7 % (ref 39.0–52.0)
Hemoglobin: 14.9 g/dL (ref 13.0–17.0)
MCH: 31.5 pg (ref 26.0–34.0)
MCHC: 34.1 g/dL (ref 30.0–36.0)
MCV: 92.4 fL (ref 78.0–100.0)
Platelets: 163 10*3/uL (ref 150–400)
RBC: 4.73 MIL/uL (ref 4.22–5.81)
RDW: 13.2 % (ref 11.5–15.5)
WBC: 8 10*3/uL (ref 4.0–10.5)

## 2013-07-14 LAB — PROTIME-INR
INR: 2.73 — AB (ref 0.00–1.49)
Prothrombin Time: 28 seconds — ABNORMAL HIGH (ref 11.6–15.2)

## 2013-07-14 LAB — LIPASE, BLOOD: Lipase: 29 U/L (ref 11–59)

## 2013-07-14 MED ORDER — METHYLPREDNISOLONE SODIUM SUCC 125 MG IJ SOLR
60.0000 mg | Freq: Two times a day (BID) | INTRAMUSCULAR | Status: DC
  Administered 2013-07-14 – 2013-07-17 (×6): 60 mg via INTRAVENOUS
  Filled 2013-07-14 (×6): qty 0.96
  Filled 2013-07-14 (×2): qty 2

## 2013-07-14 MED ORDER — CLONIDINE HCL 0.1 MG/24HR TD PTWK
0.1000 mg | MEDICATED_PATCH | TRANSDERMAL | Status: DC
  Administered 2013-07-14: 0.1 mg via TRANSDERMAL
  Filled 2013-07-14: qty 1

## 2013-07-14 MED ORDER — FAMOTIDINE IN NACL 20-0.9 MG/50ML-% IV SOLN
20.0000 mg | Freq: Two times a day (BID) | INTRAVENOUS | Status: DC
Start: 2013-07-14 — End: 2013-07-18
  Administered 2013-07-14 – 2013-07-17 (×8): 20 mg via INTRAVENOUS
  Filled 2013-07-14 (×12): qty 50

## 2013-07-14 NOTE — Progress Notes (Signed)
Patient Demographics  Grant Lara, is a 73 y.o. male, DOB - 12-May-1940, RXY:[id number]  Admit date - 07/13/2013   Admitting Physician Etta Quill, DO  Outpatient Primary MD for the patient is Lynne Logan, MD  LOS - 1   Chief Complaint  Patient presents with  . Abdominal Pain        Assessment & Plan    1. SBO - at the previous surgical anastomosis in a patient with history of Crohn's colitis. Could represent Crohn's flareup, currently supportive care with bowel rest, NG tube, IV fluids, pain and nausea control. General surgery following. GI has also been consulted. Still not passing flatus.     2. History of Crohn's colitis. Currently in remission not on any medications, follows with [facility] who have been reconsulted, and have placed him on IV steroids.     3. 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 With history of PFO. Stable, currently compensated from a cardiac standpoint, IV fluids at a low rate with close monitoring of cardiopulmonary status clinically. As needed IV Lopressor and hydralazine for blood pressure control.     4. 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 Due to #1 above Coumadin on hold, will monitor INR daily. Once below 2 may consider heparin drip depending on the clinical scenario.     5.HTN - As needed IV Lopressor and hydralazine for blood pressure control. Added Catapres patch for now.     6. CK D 3. Baseline creatinine around 1.4. Monitor, today's labs are pending.       Code Status: Full  Family Communication:   Disposition Plan: Home   Procedures  CT Abdomen-pelvis   Consults  GI, CCS   Medications  Scheduled Meds: . carvedilol  3.125 mg Oral BID WC  . cloNIDine  0.1 mg Transdermal  Weekly  . famotidine (PEPCID) IV  20 mg Intravenous Q12H  . latanoprost  1 drop Both Eyes QHS  . lisinopril  2.5 mg Oral Daily   Continuous Infusions: . sodium chloride 50 mL/hr at 07/13/13 2210   PRN Meds:.butamben-tetracaine-benzocaine, hydrALAZINE, metoprolol, morphine injection, phenol  DVT Prophylaxis  Couamdin - SCDs    Lab Results  Component Value Date   INR 2.73* 07/14/2013   INR 2.57* 07/13/2013   INR 3.1 07/01/2013     Lab Results  Component Value Date   PLT 163 07/14/2013    Antibiotics   Anti-infectives   None          Subjective:   Teandre Mounce today has, No headache, No chest pain, improved  abdominal pain - No Nausea, No new weakness tingling or numbness, No Cough - SOB.    Objective:   Filed Vitals:   07/13/13 2251 07/14/13 0225 07/14/13 0439 07/14/13 1010  BP: 121/70 177/72 181/88 161/79  Pulse: 53 72 78 74  Temp: 98.2 F (36.8 C) 97.8 F (36.6 C)  98.8 F (37.1 C)  TempSrc: Oral Oral Oral Oral  Resp: 18 18 18 18   Height:      Weight:      SpO2: 100% 100% 96% 96%    Wt Readings from Last 3 Encounters:  07/12/13 71.668 kg (158 lb)  10/20/12 73.029 kg (161 lb)  05/11/12 70.308 kg (155 lb)     Intake/Output Summary (Last 24 hours) at 07/14/13 1053 Last data filed at 07/14/13 1021  Gross per 24 hour  Intake      0 ml  Output   1900 ml  Net  -1900 ml     Physical Exam  Awake Alert, Oriented X 3, No new F.N deficits, Normal affect [place].AT,PERRAL Supple Neck,No JVD, No cervical lymphadenopathy appriciated.  Symmetrical Chest wall movement, Good air movement bilaterally, CTAB RRR,No Gallops,Rubs or new Murmurs, No Parasternal Heave +ve B.Sounds, Abd Soft, Non tender, No organomegaly appriciated, No rebound - guarding or rigidity. NG in place No Cyanosis, Clubbing or edema, No new Rash or bruise      Data Review   Micro Results No results found for this or any previous visit (from the past 240 hour(s)).  Radiology Reports Ct  Abdomen Pelvis W Contrast  07/13/2013   CLINICAL DATA:  Mid abdominal pain with nausea and vomiting.  EXAM: CT ABDOMEN AND PELVIS WITH CONTRAST  TECHNIQUE: Multidetector CT imaging of the abdomen and pelvis was performed using the standard protocol following bolus administration of intravenous contrast.  CONTRAST:  181m OMNIPAQUE IOHEXOL 300 MG/ML  SOLN  COMPARISON:  DG ABD 1 VIEW dated 11/06/2011; CT CTA ABD/PEL W/CM AND/OR W/O CM dated 11/05/2011; CT ABD/PELV WO CM dated 01/24/2011  FINDINGS: Included view of the lung a bases demonstrates dependent atelectasis. The heart appears upper limits of normal in size, mediastinal silhouette is nonsuspicious.  Subcentimeter hypodensity in left lobe of the liver likely reflects a cyst, the liver is otherwise unremarkable. Spleen, pancreas, adrenal glands are unremarkable. Subcentimeter gallstone.  Dilated small bowel up to 4.5 cm with transition point in the right lower quadrant associated with a small bowel surgical anastomosis, axial 54/89. Immediately above the anastomosis is circumferential wall thickening, coronal 28/76. Status post right hemicolectomy. Mild small bowel feces. Colonic diverticulosis with mild wall thickening, no superimposed inflammatory changes. No intraperitoneal free fluid or free air.  2 mm nonobstructing right interpolar renal calculus. 4 mm right interpolar renal calculus. Multiple bilateral renal cysts measuring up to 3.7 cm in the left interpolar kidney. Additional bilateral too small to characterize hypodensities. Urinary bladder is partially distended and unremarkable. Prostate is not enlarged. Aortoiliac vessels are normal in course and caliber with mild to moderate calcific atherosclerosis, narrowing and of possible high-grade stenosis of the external iliac arteries not tailored for evaluation. No lymphadenopathy by CT size criteria. Subcentimeter mesenteric lymph nodes seen.  Moderate degenerate change the hips. Osseous structures are  nonsuspicious.  IMPRESSION: Partial versus early small bowel obstruction with transition point immediately above surgical bowel anastomosis, status post right hemicolectomy. In addition, there is thickened appearance of the bowel immediately above the anastomosis and though this may be postoperative, recurrent disease may have this appearance. Recommend follow-up.  Nonobstructing right nephrolithiasis measure up to 4 mm. Cholelithiasis without cholecystitis.   Electronically Signed   By: CElon Alas  On: 07/13/2013 04:30   Dg Abd Portable 1v  07/13/2013   CLINICAL DATA:  Assess Nasogastric tube placement.  EXAM: PORTABLE ABDOMEN - 1 VIEW  COMPARISON:  None.  FINDINGS: The esophagogastric tube tip and proximal port lie well below the expected location of the GE junction. There are loops of mildly distended gas-filled small bowel in the right mid abdomen. There is stool and gas and contrast in the descending colon. No free extraluminal gas collections are demonstrated. The  lung bases exhibit no alveolar infiltrates or pleural effusions.  IMPRESSION: 1. The positioning of the nasogastric tube is nasal is radiographically good with the proximal port and tip lying well below the expected location of the GE junction. 2. The bowel gas pattern may reflect a partial small bowel obstruction.   Electronically Signed   By: David  Martinique   On: 07/13/2013 10:30    CBC  Recent Labs Lab 07/13/13 0049 07/14/13 0525  WBC 10.2 8.0  HGB 16.3 14.9  HCT 47.3 43.7  PLT 160 163  MCV 92.4 92.4  MCH 31.8 31.5  MCHC 34.5 34.1  RDW 13.2 13.2  LYMPHSABS 1.0  --   MONOABS 0.4  --   EOSABS 0.0  --   BASOSABS 0.0  --     Chemistries   Recent Labs Lab 07/13/13 0049  NA 140  K 4.8  CL 102  CO2 23  GLUCOSE 163*  BUN 16  CREATININE 1.47*  CALCIUM 8.7  AST 13  ALT 9  ALKPHOS 118*  BILITOT 0.7    ------------------------------------------------------------------------------------------------------------------ estimated creatinine clearance is 44.1 ml/min (by C-G formula based on Cr of 1.47). ------------------------------------------------------------------------------------------------------------------ No results found for this basename: HGBA1C,  in the last 72 hours ------------------------------------------------------------------------------------------------------------------ No results found for this basename: CHOL, HDL, LDLCALC, TRIG, CHOLHDL, LDLDIRECT,  in the last 72 hours ------------------------------------------------------------------------------------------------------------------ No results found for this basename: TSH, T4TOTAL, FREET3, T3FREE, THYROIDAB,  in the last 72 hours ------------------------------------------------------------------------------------------------------------------ No results found for this basename: VITAMINB12, FOLATE, FERRITIN, TIBC, IRON, RETICCTPCT,  in the last 72 hours  Coagulation profile  Recent Labs Lab 07/13/13 0049 07/14/13 0525  INR 2.57* 2.73*    No results found for this basename: DDIMER,  in the last 72 hours  Cardiac Enzymes No results found for this basename: CK, CKMB, TROPONINI, MYOGLOBIN,  in the last 168 hours ------------------------------------------------------------------------------------------------------------------ No components found with this basename: POCBNP,      Time Spent in minutes   35   Laray Corbit K M.D on 07/14/2013 at 10:53 AM  Between 7am to 7pm - Pager - [phone]  After 7pm go to [email or web address] - password [email or web address]  And look for the night coverage person covering for me after hours  [facility] Office  [phone]

## 2013-07-14 NOTE — Progress Notes (Signed)
Subjective: No flatus, abdomen not distended.  He has a foley in, from our standpoint that can come out.  Objective: Vital signs in last 24 hours: Temp:  [97.5 F (36.4 C)-98.2 F (36.8 C)] 97.8 F (36.6 C) (04/01 0225) Pulse Rate:  [53-78] 78 (04/01 0439) Resp:  [18] 18 (04/01 0439) BP: (121-181)/(60-88) 181/88 mmHg (04/01 0439) SpO2:  [96 %-100 %] 96 % (04/01 0439) Last BM Date: 07/12/13 1000 from the NG yesterday,  Afebrile, BP up some, Coreg on hold, lopressor, prn, he has not reached parameters for this. WBC is normal Film looks better with contrast in the colon,  NG drainage thick a little bloody looking INR 2.73 Intake/Output from previous day: 03/31 0701 - 04/01 0700 In: 176.7 [I.V.:176.7] Out: 1240 [Urine:240; Emesis/NG output:1000] Intake/Output this shift:    General appearance: alert, cooperative and no distress GI: soft, not really distended, hyperactive BS.   Lab Results:   Recent Labs  07/13/13 0049 07/14/13 0525  WBC 10.2 8.0  HGB 16.3 14.9  HCT 47.3 43.7  PLT 160 163    BMET  Recent Labs  07/13/13 0049  NA 140  K 4.8  CL 102  CO2 23  GLUCOSE 163*  BUN 16  CREATININE 1.47*  CALCIUM 8.7   PT/INR  Recent Labs  07/13/13 0049 07/14/13 0525  LABPROT 26.7* 28.0*  INR 2.57* 2.73*     Recent Labs Lab 07/13/13 0049  AST 13  ALT 9  ALKPHOS 118*  BILITOT 0.7  PROT 7.5  ALBUMIN 3.3*     Lipase     Component Value Date/Time   LIPASE 68* 07/13/2013 0049     Studies/Results: Ct Abdomen Pelvis W Contrast  07/13/2013   CLINICAL DATA:  Mid abdominal pain with nausea and vomiting.  EXAM: CT ABDOMEN AND PELVIS WITH CONTRAST  TECHNIQUE: Multidetector CT imaging of the abdomen and pelvis was performed using the standard protocol following bolus administration of intravenous contrast.  CONTRAST:  119m OMNIPAQUE IOHEXOL 300 MG/ML  SOLN  COMPARISON:  DG ABD 1 VIEW dated 11/06/2011; CT CTA ABD/PEL W/CM AND/OR W/O CM dated 11/05/2011; CT  ABD/PELV WO CM dated 01/24/2011  FINDINGS: Included view of the lung a bases demonstrates dependent atelectasis. The heart appears upper limits of normal in size, mediastinal silhouette is nonsuspicious.  Subcentimeter hypodensity in left lobe of the liver likely reflects a cyst, the liver is otherwise unremarkable. Spleen, pancreas, adrenal glands are unremarkable. Subcentimeter gallstone.  Dilated small bowel up to 4.5 cm with transition point in the right lower quadrant associated with a small bowel surgical anastomosis, axial 54/89. Immediately above the anastomosis is circumferential wall thickening, coronal 28/76. Status post right hemicolectomy. Mild small bowel feces. Colonic diverticulosis with mild wall thickening, no superimposed inflammatory changes. No intraperitoneal free fluid or free air.  2 mm nonobstructing right interpolar renal calculus. 4 mm right interpolar renal calculus. Multiple bilateral renal cysts measuring up to 3.7 cm in the left interpolar kidney. Additional bilateral too small to characterize hypodensities. Urinary bladder is partially distended and unremarkable. Prostate is not enlarged. Aortoiliac vessels are normal in course and caliber with mild to moderate calcific atherosclerosis, narrowing and of possible high-grade stenosis of the external iliac arteries not tailored for evaluation. No lymphadenopathy by CT size criteria. Subcentimeter mesenteric lymph nodes seen.  Moderate degenerate change the hips. Osseous structures are nonsuspicious.  IMPRESSION: Partial versus early small bowel obstruction with transition point immediately above surgical bowel anastomosis, status post right hemicolectomy. In addition, there  is thickened appearance of the bowel immediately above the anastomosis and though this may be postoperative, recurrent disease may have this appearance. Recommend follow-up.  Nonobstructing right nephrolithiasis measure up to 4 mm. Cholelithiasis without  cholecystitis.   Electronically Signed   By: Elon Alas   On: 07/13/2013 04:30   Dg Abd 2 Views  07/14/2013   CLINICAL DATA:  Crohn's disease.  Improving abdominal pain.  EXAM: ABDOMEN - 2 VIEW  COMPARISON:  DG ABD PORTABLE 1V dated 07/13/2013; CT ABD/PELVIS W CM dated 07/13/2013  FINDINGS: Enteric tube remains present within the stomach. Normalizing bowel gas pattern. Excreted contrast is present within the urinary bladder. Enteric contrast has progressed into the colon. The bowel-gas pattern is nonobstructive.  IMPRESSION: .  1. Unchanged nasogastric tube. 2. Normalizing bowel gas pattern.   Electronically Signed   By: Dereck Ligas M.D.   On: 07/14/2013 08:14   Dg Abd Portable 1v  07/13/2013   CLINICAL DATA:  Assess Nasogastric tube placement.  EXAM: PORTABLE ABDOMEN - 1 VIEW  COMPARISON:  None.  FINDINGS: The esophagogastric tube tip and proximal port lie well below the expected location of the GE junction. There are loops of mildly distended gas-filled small bowel in the right mid abdomen. There is stool and gas and contrast in the descending colon. No free extraluminal gas collections are demonstrated. The lung bases exhibit no alveolar infiltrates or pleural effusions.  IMPRESSION: 1. The positioning of the nasogastric tube is nasal is radiographically good with the proximal port and tip lying well below the expected location of the GE junction. 2. The bowel gas pattern may reflect a partial small bowel obstruction.   Electronically Signed   By: David  Martinique   On: 07/13/2013 10:30    Medications: . carvedilol  3.125 mg Oral BID WC  . cloNIDine  0.1 mg Transdermal Weekly  . latanoprost  1 drop Both Eyes QHS  . lisinopril  2.5 mg Oral Daily   Prior to Admission medications   Medication Sig Start Date End Date Taking? Authorizing Provider  carvedilol (COREG) 3.125 MG tablet Take 1 tablet (3.125 mg total) by mouth 2 (two) times daily. 10/20/12 10/20/13 Yes Minus Breeding, MD  latanoprost  (XALATAN) 0.005 % ophthalmic solution Place 1 drop into both eyes at bedtime. 06/29/13  Yes Historical Provider, MD  lisinopril (PRINIVIL,ZESTRIL) 5 MG tablet Take 0.5 tablets (2.5 mg total) by mouth daily. 10/20/12 10/20/13 Yes Minus Breeding, MD  warfarin (COUMADIN) 5 MG tablet Take 5 mg by mouth daily.   Yes Historical Provider, MD     Assessment/Plan Recurrent SBO with hx of Crohn's and small bowel or colon resection in 1994, and 1996.  Hospitalized 01/2011, and 10/2011 with SBO  Hx of Crohn's with no treatment for some years.  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 CMP is pending.  If renal function is better foley can come out from our standpoint.  LOS: 1 day    Shyler Hamill 07/14/2013

## 2013-07-14 NOTE — Progress Notes (Signed)
No flatus but contrast in colon, abd flat, some bs present, hopefully will resolve given contrast in colon, cont current care, agree with above

## 2013-07-14 NOTE — Progress Notes (Signed)
No foley found during assessment, pt has a condom cath i

## 2013-07-14 NOTE — Progress Notes (Addendum)
Patient ID: Grant Lara, male   DOB: [birth year], 73 y.o.   MRN: [id number] [facility] Progress Note  Grant Lara 73 y.o. Oct 11, 1940   Subjective: Dark bilious fluid noted in NG suction canister. Complaining of abdominal pain and nausea without vomiting. No flatus or BMs. Wife at bedside.  Objective: Vital signs in last 24 hours: Filed Vitals:   07/14/13 1010  BP: 161/79  Pulse: 74  Temp: 98.8 F (37.1 C)  Resp: 18  NG output: 1,000 ml yesterday  Physical Exam: Gen: elderly, frail, alert, no acute distress CV: RRR Chest: coarse breath sounds Abd: diffusely tender with guarding, flat, BS not appreciated  Lab Results:  Recent Labs  07/13/13 0049 07/14/13 0525  NA 140 141  K 4.8 3.9  CL 102 101  CO2 23 24  GLUCOSE 163* 103*  BUN 16 13  CREATININE 1.47* 1.17  CALCIUM 8.7 8.7    Recent Labs  07/13/13 0049 07/14/13 0525  AST 13 13  ALT 9 8  ALKPHOS 118* 108  BILITOT 0.7 1.3*  PROT 7.5 6.5  ALBUMIN 3.3* 2.8*    Recent Labs  07/13/13 0049 07/14/13 0525  WBC 10.2 8.0  NEUTROABS 8.7*  --   HGB 16.3 14.9  HCT 47.3 43.7  MCV 92.4 92.4  PLT 160 163    Recent Labs  07/13/13 0049 07/14/13 0525  LABPROT 26.7* 28.0*  INR 2.57* 2.73*      Assessment/Plan: 73 yo with Crohn's disease and SBO question due to active Crohn's disease vs adhesions vs chronic stenosis. Start IV steroids, NGT, bowel rest, IVFs, supportive care. Will follow.   Story C. 07/14/2013, 12:14 PM

## 2013-07-15 ENCOUNTER — Inpatient Hospital Stay (HOSPITAL_COMMUNITY): Payer: Medicare Other

## 2013-07-15 LAB — GLUCOSE, CAPILLARY
Glucose-Capillary: 115 mg/dL — ABNORMAL HIGH (ref 70–99)
Glucose-Capillary: 125 mg/dL — ABNORMAL HIGH (ref 70–99)
Glucose-Capillary: 156 mg/dL — ABNORMAL HIGH (ref 70–99)
Glucose-Capillary: 160 mg/dL — ABNORMAL HIGH (ref 70–99)
Glucose-Capillary: 170 mg/dL — ABNORMAL HIGH (ref 70–99)
Glucose-Capillary: 171 mg/dL — ABNORMAL HIGH (ref 70–99)

## 2013-07-15 LAB — CBC
HCT: 44.4 % (ref 39.0–52.0)
Hemoglobin: 15.4 g/dL (ref 13.0–17.0)
MCH: 31.9 pg (ref 26.0–34.0)
MCHC: 34.7 g/dL (ref 30.0–36.0)
MCV: 91.9 fL (ref 78.0–100.0)
PLATELETS: 158 10*3/uL (ref 150–400)
RBC: 4.83 MIL/uL (ref 4.22–5.81)
RDW: 13.2 % (ref 11.5–15.5)
WBC: 15.3 10*3/uL — ABNORMAL HIGH (ref 4.0–10.5)

## 2013-07-15 LAB — BASIC METABOLIC PANEL
BUN: 21 mg/dL (ref 6–23)
CO2: 25 mEq/L (ref 19–32)
CREATININE: 1.22 mg/dL (ref 0.50–1.35)
Calcium: 9.1 mg/dL (ref 8.4–10.5)
Chloride: 102 mEq/L (ref 96–112)
GFR calc non Af Amer: 57 mL/min — ABNORMAL LOW (ref >90)
GFR, EST AFRICAN AMERICAN: 66 mL/min — AB (ref >90)
Glucose, Bld: 180 mg/dL — ABNORMAL HIGH (ref 70–99)
Potassium: 4.3 mEq/L (ref 3.7–5.3)
Sodium: 141 mEq/L (ref 137–147)

## 2013-07-15 LAB — PROTIME-INR
INR: 2.53 — ABNORMAL HIGH (ref 0.00–1.49)
Prothrombin Time: 26.4 seconds — ABNORMAL HIGH (ref 11.6–15.2)

## 2013-07-15 MED ORDER — POTASSIUM CHLORIDE IN NACL 20-0.9 MEQ/L-% IV SOLN
INTRAVENOUS | Status: DC
  Administered 2013-07-15: 08:00:00 via INTRAVENOUS
  Administered 2013-07-16: 75 mL/h via INTRAVENOUS
  Filled 2013-07-15 (×4): qty 1000

## 2013-07-15 NOTE — Progress Notes (Signed)
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 Michail Sermon with see this weekend and would probably need a colonoscopy at some point to rule out Crohn's versus stricturing and hopefully his diet can be slowly advanced and this can be set up as an outpatient  [facility]

## 2013-07-15 NOTE — Progress Notes (Signed)
Noted NGT came out when the surgical PA saw the pt. Per his note will re-place NGT for emesis or persistent nausea. Notified Dr. Candiss Norse, new orders received. Will monitor.

## 2013-07-15 NOTE — Progress Notes (Signed)
Patient Demographics  Grant Lara, is a 73 y.o. male, DOB - 06/22/1940, KRC:[id number]  Admit date - 07/13/2013   Admitting Physician Etta Quill, DO  Outpatient Primary MD for the patient is Lynne Logan, MD  LOS - 2   Chief Complaint  Patient presents with  . Abdominal Pain        Assessment & Plan    1. SBO - at the previous surgical anastomosis in a patient with history of Crohn's colitis. Could represent Crohn's flareup, currently supportive care with bowel rest, NG tube currently out as he sneezed it out on 07-2013, per surgery monitor without NG tube closely, continue with IV fluids, pain and nausea control. General surgery GI are following. Still not passing flatus.     2. History of Crohn's colitis. Currently in remission not on any medications at home, GI following, and have placed him on IV steroids.     3. 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 With history of PFO. Stable, currently compensated from a cardiac standpoint, IV fluids at a low rate with close monitoring of cardiopulmonary status clinically. As needed IV Lopressor and hydralazine for blood pressure control.     4. 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 Due to #1 above Coumadin on hold, will monitor INR daily. Once below 2 may consider heparin drip depending on the clinical scenario.  Lab Results  Component Value Date   INR 2.53* 07/15/2013   INR 2.73* 07/14/2013   INR 2.57* 07/13/2013      5.HTN - As needed IV Lopressor and hydralazine for blood pressure control. Added Catapres patch for now.     6. CK D 3. Baseline creatinine around 1.4. Monitor, today's labs are pending.       Code Status: Full  Family Communication:   Disposition Plan:  Home   Procedures  CT Abdomen-pelvis   Consults  GI, CCS   Medications  Scheduled Meds: . carvedilol  3.125 mg Oral BID WC  . cloNIDine  0.1 mg Transdermal Weekly  . famotidine (PEPCID) IV  20 mg Intravenous Q12H  . latanoprost  1 drop Both Eyes QHS  . lisinopril  2.5 mg Oral Daily  . methylPREDNISolone (SOLU-MEDROL) injection  60 mg Intravenous Q12H   Continuous Infusions: . 0.9 % NaCl with KCl 20 mEq / L 75 mL/hr at 07/15/13 0756   PRN Meds:.butamben-tetracaine-benzocaine, hydrALAZINE, metoprolol, morphine injection, phenol  DVT Prophylaxis  Couamdin - SCDs    Lab Results  Component Value Date   INR 2.53* 07/15/2013   INR 2.73* 07/14/2013   INR 2.57* 07/13/2013     Lab Results  Component Value Date   PLT 158 07/15/2013    Antibiotics   Anti-infectives   None          Subjective:   Grant Lara today has, No headache, No chest pain, improved  abdominal pain - No Nausea, No new weakness tingling or numbness, No Cough - SOB.    Objective:   Filed Vitals:   07/14/13 1745 07/14/13 2112 07/15/13 0115 07/15/13 0529  BP: 156/80 161/73 131/74 149/68  Pulse: 82 81 64 64  Temp: 98.4 F (36.9 C) 99.1 F (37.3  C) 98.1 F (36.7 C) 98.1 F (36.7 C)  TempSrc: Oral Oral Oral Oral  Resp: 18 18 16 16   Height:      Weight:      SpO2: 98% 95% 96% 100%    Wt Readings from Last 3 Encounters:  07/12/13 71.668 kg (158 lb)  10/20/12 73.029 kg (161 lb)  05/11/12 70.308 kg (155 lb)     Intake/Output Summary (Last 24 hours) at 07/15/13 1023 Last data filed at 07/15/13 0955  Gross per 24 hour  Intake      0 ml  Output   1900 ml  Net  -1900 ml     Physical Exam  Awake Alert, Oriented X 3, No new F.N deficits, Normal affect [place].AT,PERRAL Supple Neck,No JVD, No cervical lymphadenopathy appriciated.  Symmetrical Chest wall movement, Good air movement bilaterally, CTAB RRR,No Gallops,Rubs or new Murmurs, No Parasternal Heave +ve B.Sounds, Abd Soft, Non tender, No  organomegaly appriciated, No rebound - guarding or rigidity. NG in place No Cyanosis, Clubbing or edema, No new Rash or bruise      Data Review   Micro Results No results found for this or any previous visit (from the past 240 hour(s)).  Radiology Reports Ct Abdomen Pelvis W Contrast  07/13/2013   CLINICAL DATA:  Mid abdominal pain with nausea and vomiting.  EXAM: CT ABDOMEN AND PELVIS WITH CONTRAST  TECHNIQUE: Multidetector CT imaging of the abdomen and pelvis was performed using the standard protocol following bolus administration of intravenous contrast.  CONTRAST:  167m OMNIPAQUE IOHEXOL 300 MG/ML  SOLN  COMPARISON:  DG ABD 1 VIEW dated 11/06/2011; CT CTA ABD/PEL W/CM AND/OR W/O CM dated 11/05/2011; CT ABD/PELV WO CM dated 01/24/2011  FINDINGS: Included view of the lung a bases demonstrates dependent atelectasis. The heart appears upper limits of normal in size, mediastinal silhouette is nonsuspicious.  Subcentimeter hypodensity in left lobe of the liver likely reflects a cyst, the liver is otherwise unremarkable. Spleen, pancreas, adrenal glands are unremarkable. Subcentimeter gallstone.  Dilated small bowel up to 4.5 cm with transition point in the right lower quadrant associated with a small bowel surgical anastomosis, axial 54/89. Immediately above the anastomosis is circumferential wall thickening, coronal 28/76. Status post right hemicolectomy. Mild small bowel feces. Colonic diverticulosis with mild wall thickening, no superimposed inflammatory changes. No intraperitoneal free fluid or free air.  2 mm nonobstructing right interpolar renal calculus. 4 mm right interpolar renal calculus. Multiple bilateral renal cysts measuring up to 3.7 cm in the left interpolar kidney. Additional bilateral too small to characterize hypodensities. Urinary bladder is partially distended and unremarkable. Prostate is not enlarged. Aortoiliac vessels are normal in course and caliber with mild to moderate calcific  atherosclerosis, narrowing and of possible high-grade stenosis of the external iliac arteries not tailored for evaluation. No lymphadenopathy by CT size criteria. Subcentimeter mesenteric lymph nodes seen.  Moderate degenerate change the hips. Osseous structures are nonsuspicious.  IMPRESSION: Partial versus early small bowel obstruction with transition point immediately above surgical bowel anastomosis, status post right hemicolectomy. In addition, there is thickened appearance of the bowel immediately above the anastomosis and though this may be postoperative, recurrent disease may have this appearance. Recommend follow-up.  Nonobstructing right nephrolithiasis measure up to 4 mm. Cholelithiasis without cholecystitis.   Electronically Signed   By: CElon Alas  On: 07/13/2013 04:30   Dg Abd Portable 1v  07/13/2013   CLINICAL DATA:  Assess Nasogastric tube placement.  EXAM: PORTABLE ABDOMEN - 1 VIEW  COMPARISON:  None.  FINDINGS: The esophagogastric tube tip and proximal port lie well below the expected location of the GE junction. There are loops of mildly distended gas-filled small bowel in the right mid abdomen. There is stool and gas and contrast in the descending colon. No free extraluminal gas collections are demonstrated. The lung bases exhibit no alveolar infiltrates or pleural effusions.  IMPRESSION: 1. The positioning of the nasogastric tube is nasal is radiographically good with the proximal port and tip lying well below the expected location of the GE junction. 2. The bowel gas pattern may reflect a partial small bowel obstruction.   Electronically Signed   By: David  Martinique   On: 07/13/2013 10:30    CBC  Recent Labs Lab 07/13/13 0049 07/14/13 0525 07/15/13 0624  WBC 10.2 8.0 15.3*  HGB 16.3 14.9 15.4  HCT 47.3 43.7 44.4  PLT 160 163 158  MCV 92.4 92.4 91.9  MCH 31.8 31.5 31.9  MCHC 34.5 34.1 34.7  RDW 13.2 13.2 13.2  LYMPHSABS 1.0  --   --   MONOABS 0.4  --   --   EOSABS  0.0  --   --   BASOSABS 0.0  --   --     Chemistries   Recent Labs Lab 07/13/13 0049 07/14/13 0525 07/15/13 0624  NA 140 141 141  K 4.8 3.9 4.3  CL 102 101 102  CO2 23 24 25   GLUCOSE 163* 103* 180*  BUN 16 13 21   CREATININE 1.47* 1.17 1.22  CALCIUM 8.7 8.7 9.1  AST 13 13  --   ALT 9 8  --   ALKPHOS 118* 108  --   BILITOT 0.7 1.3*  --    ------------------------------------------------------------------------------------------------------------------ estimated creatinine clearance is 53.1 ml/min (by C-G formula based on Cr of 1.22). ------------------------------------------------------------------------------------------------------------------ No results found for this basename: HGBA1C,  in the last 72 hours ------------------------------------------------------------------------------------------------------------------ No results found for this basename: CHOL, HDL, LDLCALC, TRIG, CHOLHDL, LDLDIRECT,  in the last 72 hours ------------------------------------------------------------------------------------------------------------------ No results found for this basename: TSH, T4TOTAL, FREET3, T3FREE, THYROIDAB,  in the last 72 hours ------------------------------------------------------------------------------------------------------------------ No results found for this basename: VITAMINB12, FOLATE, FERRITIN, TIBC, IRON, RETICCTPCT,  in the last 72 hours  Coagulation profile  Recent Labs Lab 07/13/13 0049 07/14/13 0525 07/15/13 0624  INR 2.57* 2.73* 2.53*    No results found for this basename: DDIMER,  in the last 72 hours  Cardiac Enzymes No results found for this basename: CK, CKMB, TROPONINI, MYOGLOBIN,  in the last 168 hours ------------------------------------------------------------------------------------------------------------------ No components found with this basename: POCBNP,      Time Spent in minutes   35   SINGH,PRASHANT K M.D on 07/15/2013 at  10:23 AM  Between 7am to 7pm - Pager - [phone]  After 7pm go to [email or web address] - password [email or web address]  And look for the night coverage person covering for me after hours  [facility] Office  [phone]

## 2013-07-15 NOTE — Progress Notes (Signed)
Subjective: Smiling but no flatus since I saw him yesterday,  Only complaint is the NG tube,  He has a condom cath in place.  I ask them to remove it and walk him as much as possible.  Objective: Vital signs in last 24 hours: Temp:  [98.1 F (36.7 C)-99.1 F (37.3 C)] 98.1 F (36.7 C) (04/02 0529) Pulse Rate:  [64-82] 64 (04/02 0529) Resp:  [16-18] 16 (04/02 0529) BP: (131-161)/(67-80) 149/68 mmHg (04/02 0529) SpO2:  [95 %-100 %] 100 % (04/02 0529) Last BM Date: 07/12/13 1800 ml from NG yesterday Afebrile, VSS, BP still up some with negative fluid balance, nothing on intake side yesterday so I don't know how true this is. WBC is up creatinine is also up. And glucose is up.  Solumedrol started yesterday 1410 hours yesterday.  He has had two doses. INR 2.53 Intake/Output from previous day: 08-04-22 0701 - 04/02 0700 In: -  Out: 2100 [Urine:300; Emesis/NG output:1800] Intake/Output this shift:    General appearance: alert, cooperative and no distress Resp: clear to auscultation bilaterally GI: he isn't really distended, but I don't hear BS. No flatus  Lab Results:   Recent Labs  Aug 03, 2013 0525 07/15/13 0624  WBC 8.0 15.3*  HGB 14.9 15.4  HCT 43.7 44.4  PLT 163 158    BMET  Recent Labs  08/03/13 0525 07/15/13 0624  NA 141 141  K 3.9 4.3  CL 101 102  CO2 24 25  GLUCOSE 103* 180*  BUN 13 21  CREATININE 1.17 1.22  CALCIUM 8.7 9.1   PT/INR  Recent Labs  Aug 03, 2013 0525 07/15/13 0624  LABPROT 28.0* 26.4*  INR 2.73* 2.53*     Recent Labs Lab 07/13/13 0049 08/03/13 0525  AST 13 13  ALT 9 8  ALKPHOS 118* 108  BILITOT 0.7 1.3*  PROT 7.5 6.5  ALBUMIN 3.3* 2.8*     Lipase     Component Value Date/Time   LIPASE 29 03-Aug-2013 0525     Studies/Results: Dg Abd 2 Views  08-03-13   CLINICAL DATA:  Crohn's disease.  Improving abdominal pain.  EXAM: ABDOMEN - 2 VIEW  COMPARISON:  DG ABD PORTABLE 1V dated 07/13/2013; CT ABD/PELVIS W CM dated 07/13/2013   FINDINGS: Enteric tube remains present within the stomach. Normalizing bowel gas pattern. Excreted contrast is present within the urinary bladder. Enteric contrast has progressed into the colon. The bowel-gas pattern is nonobstructive.  IMPRESSION: .  1. Unchanged nasogastric tube. 2. Normalizing bowel gas pattern.   Electronically Signed   By: Dereck Ligas M.D.   On: August 03, 2013 08:14   Dg Abd Portable 1v  07/13/2013   CLINICAL DATA:  Assess Nasogastric tube placement.  EXAM: PORTABLE ABDOMEN - 1 VIEW  COMPARISON:  None.  FINDINGS: The esophagogastric tube tip and proximal port lie well below the expected location of the GE junction. There are loops of mildly distended gas-filled small bowel in the right mid abdomen. There is stool and gas and contrast in the descending colon. No free extraluminal gas collections are demonstrated. The lung bases exhibit no alveolar infiltrates or pleural effusions.  IMPRESSION: 1. The positioning of the nasogastric tube is nasal is radiographically good with the proximal port and tip lying well below the expected location of the GE junction. 2. The bowel gas pattern may reflect a partial small bowel obstruction.   Electronically Signed   By: David  Martinique   On: 07/13/2013 10:30    Medications: . carvedilol  3.125 mg  Oral BID WC  . cloNIDine  0.1 mg Transdermal Weekly  . famotidine (PEPCID) IV  20 mg Intravenous Q12H  . latanoprost  1 drop Both Eyes QHS  . lisinopril  2.5 mg Oral Daily  . methylPREDNISolone (SOLU-MEDROL) injection  60 mg Intravenous Q12H    Assessment/Plan Recurrent SBO with hx of Crohn's and small bowel or colon resection in 1994, and 1996.  Hospitalized 01/2011, and 10/2011 with SBO  Hx of Crohn's with no treatment for some years.   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 Acute renal  insuffiencey   Plan:   I will check a film on him, I expected him to be passing gas and less NG drainage by today.   K+ is fine.   He sneezed after I came out of room and NG came out.  I was going to send him down for a film so I will and see how he does.  If he has recurrent nausea or distension we will need to put NG back.  LOS: 2 days    Grant Lara 07/15/2013

## 2013-07-15 NOTE — Progress Notes (Signed)
Agree with above, his ng came out today not on purpose, not sure he is going to get through this without surgery, will try without tube for now, check films in am

## 2013-07-16 ENCOUNTER — Inpatient Hospital Stay (HOSPITAL_COMMUNITY): Payer: Medicare Other

## 2013-07-16 DIAGNOSIS — K5090 Crohn's disease, unspecified, without complications: Secondary | ICD-10-CM

## 2013-07-16 LAB — GLUCOSE, CAPILLARY
GLUCOSE-CAPILLARY: 136 mg/dL — AB (ref 70–99)
Glucose-Capillary: 142 mg/dL — ABNORMAL HIGH (ref 70–99)
Glucose-Capillary: 145 mg/dL — ABNORMAL HIGH (ref 70–99)
Glucose-Capillary: 147 mg/dL — ABNORMAL HIGH (ref 70–99)
Glucose-Capillary: 163 mg/dL — ABNORMAL HIGH (ref 70–99)
Glucose-Capillary: 183 mg/dL — ABNORMAL HIGH (ref 70–99)

## 2013-07-16 LAB — BASIC METABOLIC PANEL
BUN: 29 mg/dL — ABNORMAL HIGH (ref 6–23)
CHLORIDE: 104 meq/L (ref 96–112)
CO2: 19 mEq/L (ref 19–32)
Calcium: 9 mg/dL (ref 8.4–10.5)
Creatinine, Ser: 1.16 mg/dL (ref 0.50–1.35)
GFR, EST AFRICAN AMERICAN: 70 mL/min — AB (ref >90)
GFR, EST NON AFRICAN AMERICAN: 60 mL/min — AB (ref >90)
Glucose, Bld: 155 mg/dL — ABNORMAL HIGH (ref 70–99)
POTASSIUM: 5.4 meq/L — AB (ref 3.7–5.3)
Sodium: 139 mEq/L (ref 137–147)

## 2013-07-16 LAB — POTASSIUM: Potassium: 5.4 mEq/L — ABNORMAL HIGH (ref 3.7–5.3)

## 2013-07-16 LAB — PROTIME-INR
INR: 2.44 — ABNORMAL HIGH (ref 0.00–1.49)
PROTHROMBIN TIME: 25.7 s — AB (ref 11.6–15.2)

## 2013-07-16 MED ORDER — SODIUM CHLORIDE 0.9 % IV BOLUS (SEPSIS)
500.0000 mL | Freq: Once | INTRAVENOUS | Status: AC
Start: 2013-07-16 — End: 2013-07-16
  Administered 2013-07-16: 500 mL via INTRAVENOUS

## 2013-07-16 MED ORDER — FUROSEMIDE 10 MG/ML IJ SOLN
20.0000 mg | Freq: Once | INTRAMUSCULAR | Status: AC
  Administered 2013-07-16: 20 mg via INTRAVENOUS
  Filled 2013-07-16 (×2): qty 2

## 2013-07-16 MED ORDER — SODIUM CHLORIDE 0.9 % IV SOLN
INTRAVENOUS | Status: AC
  Administered 2013-07-16: 08:00:00 via INTRAVENOUS

## 2013-07-16 MED ORDER — LISINOPRIL 2.5 MG PO TABS: 2.5000 mg | ORAL_TABLET | Freq: Every day | ORAL | Status: DC

## 2013-07-16 NOTE — Progress Notes (Signed)
Patient ID: Grant Lara, male   DOB: [birth year], 73 y.o.   MRN: [id number]    Subjective: Feels better today. He denies nausea or abdominal pain. Sipping of water without difficulty. Has had a lot of flatus but no bowel movements.  Objective: Vital signs in last 24 hours: Temp:  [97.5 F (36.4 C)-98.1 F (36.7 C)] 97.7 F (36.5 C) 04-Aug-2022 1013) Pulse Rate:  [50-78] 78 08-04-2022 1013) Resp:  [16-20] 20 2022/08/04 1013) BP: (127-147)/(58-73) 147/73 mmHg 08-04-2022 1013) SpO2:  [96 %-100 %] 100 % 2022-08-04 1013) Last BM Date: 07/12/13  Intake/Output from previous day: 04/02 0701 - 04-Aug-2022 0700 In: -  Out: 600 [Urine:600] Intake/Output this shift: Total I/O In: -  Out: 175 [Urine:175]  General appearance: alert, cooperative and no distress GI: normal findings: soft, non-tender and nondistended  Lab Results:   Recent Labs  07/14/13 0525 07/15/13 0624  WBC 8.0 15.3*  HGB 14.9 15.4  HCT 43.7 44.4  PLT 163 158   BMET  Recent Labs  07/15/13 0624 03-Aug-2013 0605  NA 141 139  K 4.3 5.4*  CL 102 104  CO2 25 19  GLUCOSE 180* 155*  BUN 21 29*  CREATININE 1.22 1.16  CALCIUM 9.1 9.0     Studies/Results: Dg Abd 2 Views  Aug 03, 2013   CLINICAL DATA:  Small bowel obstruction.  EXAM: ABDOMEN - 2 VIEW  COMPARISON:  07/15/2013 and 07/14/2013  FINDINGS: There has been slight distal progression of the contrast in the descending colon. There are no dilated loops of large or small bowel. Contrast persists in the gallbladder.  No free air.  No osseous abnormality.  IMPRESSION: Findings consistent with ileus. Minimal progression of contrast in the descending colon.   Electronically Signed   By: Rozetta Nunnery M.D.   On: 03-Aug-2013 08:12   Dg Abd 2 Views  07/15/2013   CLINICAL DATA:  Abdominal discomfort.  EXAM: ABDOMEN - 2 VIEW  COMPARISON:  DG ABD 2 VIEWS dated 07/14/2013  FINDINGS: Soft tissue structures are unremarkable. Gas pattern is nonspecific. Air-filled loops of nondistended small bowel noted.  Contrast is noted colon. Contrast is noted within gallbladder. Degenerative changes lumbar spine and both hips.  IMPRESSION: Nonspecific exam.   Electronically Signed   By: Marcello Moores  Register   On: 07/15/2013 10:10    Anti-infectives: Anti-infectives   None      Assessment/Plan: Partial small bowel obstruction with history of Crohn's. Thickening at the anastomosis consistent with Crohn's flare or possible scarring. He seems better currently, on steroids Continue current nonoperative management. I think he would be okay to start a clear liquid diet.    LOS: 3 days    Grant Lara 08-03-13

## 2013-07-16 NOTE — Progress Notes (Signed)
Patient Demographics  Grant Lara, is a 73 y.o. male, DOB - [birth year], ZOX:[id number]  Admit date - 07/13/2013   Admitting Physician Etta Quill, DO  Outpatient Primary MD for the patient is Lynne Logan, MD  LOS - 3   Chief Complaint  Patient presents with  . Abdominal Pain        Assessment & Plan    1. SBO - at the previous surgical anastomosis in a patient with history of Crohn's colitis. Could represent Crohn's flareup, some improvement with supportive care and now passing flatus, upper GI on clear liquids now, general surgery closely following. Monitor closely.    2. History of Crohn's colitis. Currently in remission not on any medications at home, GI following, and have placed him on IV steroids.     3. 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 With history of PFO. Stable, currently compensated from a cardiac standpoint, IV fluids at a low rate with close monitoring of cardiopulmonary status clinically. As needed IV Lopressor and hydralazine for blood pressure control.     4. 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 Due to #1 above Coumadin on hold, will monitor INR daily. Once below 2 may consider heparin drip depending on the clinical scenario.  Lab Results  Component Value Date   INR 2.44* 07/16/2013   INR 2.53* 07/15/2013   INR 2.73* 07/14/2013      5.HTN - As needed IV Lopressor and hydralazine for blood pressure control. Added Catapres patch for now.     6. CK D 3. Baseline creatinine around 1.4. Monitor, today's labs are pending.     7. Mild hyperkalemia. Given normal saline bolus with IV Lasix, repeat potassium in the afternoon, since started on clear liquids if repeat potassium is still high we'll give him low-dose  oral Kayexalate.       Code Status: Full  Family Communication:   Disposition Plan: Home   Procedures  CT Abdomen-pelvis   Consults  GI, CCS   Medications  Scheduled Meds: . carvedilol  3.125 mg Oral BID WC  . cloNIDine  0.1 mg Transdermal Weekly  . famotidine (PEPCID) IV  20 mg Intravenous Q12H  . furosemide  20 mg Intravenous Once  . latanoprost  1 drop Both Eyes QHS  . [START ON 07/18/2013] lisinopril  2.5 mg Oral Daily  . methylPREDNISolone (SOLU-MEDROL) injection  60 mg Intravenous Q12H  . sodium chloride  500 mL Intravenous Once   Continuous Infusions: . sodium chloride 75 mL/hr at 07/16/13 0821   PRN Meds:.butamben-tetracaine-benzocaine, hydrALAZINE, metoprolol, morphine injection, phenol  DVT Prophylaxis  Couamdin - SCDs    Lab Results  Component Value Date   INR 2.44* 07/16/2013   INR 2.53* 07/15/2013   INR 2.73* 07/14/2013     Lab Results  Component Value Date   PLT 158 07/15/2013    Antibiotics   Anti-infectives   None          Subjective:   [name] today has, No headache, No chest pain, improved  abdominal pain - No Nausea, No new weakness tingling or numbness, No Cough - SOB.    Objective:   Filed Vitals:   07/15/13  1755 07/15/13 2142 07/16/13 0153 07/16/13 0600  BP: 135/68 127/60 128/60 133/61  Pulse: 61 50 50 50  Temp: 97.7 F (36.5 C) 98.1 F (36.7 C) 98 F (36.7 C) 97.5 F (36.4 C)  TempSrc: Oral Oral Oral Oral  Resp: 16  16 16   Height:      Weight:      SpO2: 96% 100% 97% 100%    Wt Readings from Last 3 Encounters:  07/12/13 71.668 kg (158 lb)  10/20/12 73.029 kg (161 lb)  05/11/12 70.308 kg (155 lb)     Intake/Output Summary (Last 24 hours) at 07/16/13 0912 Last data filed at 07/15/13 0955  Gross per 24 hour  Intake      0 ml  Output    600 ml  Net   -600 ml     Physical Exam  Awake Alert, Oriented X 3, No new F.N deficits, Normal affect [place].AT,PERRAL Supple Neck,No JVD, No cervical lymphadenopathy  appriciated.  Symmetrical Chest wall movement, Good air movement bilaterally, CTAB RRR,No Gallops,Rubs or new Murmurs, No Parasternal Heave +ve B.Sounds, Abd Soft, Non tender, No organomegaly appriciated, No rebound - guarding or rigidity. NG in place No Cyanosis, Clubbing or edema, No new Rash or bruise      Data Review   Micro Results No results found for this or any previous visit (from the past 240 hour(s)).  Radiology Reports Ct Abdomen Pelvis W Contrast  07/13/2013   CLINICAL DATA:  Mid abdominal pain with nausea and vomiting.  EXAM: CT ABDOMEN AND PELVIS WITH CONTRAST  TECHNIQUE: Multidetector CT imaging of the abdomen and pelvis was performed using the standard protocol following bolus administration of intravenous contrast.  CONTRAST:  114m OMNIPAQUE IOHEXOL 300 MG/ML  SOLN  COMPARISON:  DG ABD 1 VIEW dated 11/06/2011; CT CTA ABD/PEL W/CM AND/OR W/O CM dated 11/05/2011; CT ABD/PELV WO CM dated 01/24/2011  FINDINGS: Included view of the lung a bases demonstrates dependent atelectasis. The heart appears upper limits of normal in size, mediastinal silhouette is nonsuspicious.  Subcentimeter hypodensity in left lobe of the liver likely reflects a cyst, the liver is otherwise unremarkable. Spleen, pancreas, adrenal glands are unremarkable. Subcentimeter gallstone.  Dilated small bowel up to 4.5 cm with transition point in the right lower quadrant associated with a small bowel surgical anastomosis, axial 54/89. Immediately above the anastomosis is circumferential wall thickening, coronal 28/76. Status post right hemicolectomy. Mild small bowel feces. Colonic diverticulosis with mild wall thickening, no superimposed inflammatory changes. No intraperitoneal free fluid or free air.  2 mm nonobstructing right interpolar renal calculus. 4 mm right interpolar renal calculus. Multiple bilateral renal cysts measuring up to 3.7 cm in the left interpolar kidney. Additional bilateral too small to characterize  hypodensities. Urinary bladder is partially distended and unremarkable. Prostate is not enlarged. Aortoiliac vessels are normal in course and caliber with mild to moderate calcific atherosclerosis, narrowing and of possible high-grade stenosis of the external iliac arteries not tailored for evaluation. No lymphadenopathy by CT size criteria. Subcentimeter mesenteric lymph nodes seen.  Moderate degenerate change the hips. Osseous structures are nonsuspicious.  IMPRESSION: Partial versus early small bowel obstruction with transition point immediately above surgical bowel anastomosis, status post right hemicolectomy. In addition, there is thickened appearance of the bowel immediately above the anastomosis and though this may be postoperative, recurrent disease may have this appearance. Recommend follow-up.  Nonobstructing right nephrolithiasis measure up to 4 mm. Cholelithiasis without cholecystitis.   Electronically Signed   By: CElon Alas  On: 07/13/2013 04:30   Dg Abd Portable 1v  07/13/2013   CLINICAL DATA:  Assess Nasogastric tube placement.  EXAM: PORTABLE ABDOMEN - 1 VIEW  COMPARISON:  None.  FINDINGS: The esophagogastric tube tip and proximal port lie well below the expected location of the GE junction. There are loops of mildly distended gas-filled small bowel in the right mid abdomen. There is stool and gas and contrast in the descending colon. No free extraluminal gas collections are demonstrated. The lung bases exhibit no alveolar infiltrates or pleural effusions.  IMPRESSION: 1. The positioning of the nasogastric tube is nasal is radiographically good with the proximal port and tip lying well below the expected location of the GE junction. 2. The bowel gas pattern may reflect a partial small bowel obstruction.   Electronically Signed   By: David  [place]   On: 07/13/2013 10:30    CBC  Recent Labs Lab 07/13/13 0049 07/14/13 0525 07/15/13 0624  WBC 10.2 8.0 15.3*  HGB 16.3 14.9 15.4    HCT 47.3 43.7 44.4  PLT 160 163 158  MCV 92.4 92.4 91.9  MCH 31.8 31.5 31.9  MCHC 34.5 34.1 34.7  RDW 13.2 13.2 13.2  LYMPHSABS 1.0  --   --   MONOABS 0.4  --   --   EOSABS 0.0  --   --   BASOSABS 0.0  --   --     Chemistries   Recent Labs Lab 07/13/13 0049 07/14/13 0525 07/15/13 0624 07/16/13 0605  NA 140 141 141 139  K 4.8 3.9 4.3 5.4*  CL 102 101 102 104  CO2 23 24 25 19   GLUCOSE 163* 103* 180* 155*  BUN 16 13 21  29*  CREATININE 1.47* 1.17 1.22 1.16  CALCIUM 8.7 8.7 9.1 9.0  AST 13 13  --   --   ALT 9 8  --   --   ALKPHOS 118* 108  --   --   BILITOT 0.7 1.3*  --   --    ------------------------------------------------------------------------------------------------------------------ estimated creatinine clearance is 55.9 ml/min (by C-G formula based on Cr of 1.16). ------------------------------------------------------------------------------------------------------------------ No results found for this basename: HGBA1C,  in the last 72 hours ------------------------------------------------------------------------------------------------------------------ No results found for this basename: CHOL, HDL, LDLCALC, TRIG, CHOLHDL, LDLDIRECT,  in the last 72 hours ------------------------------------------------------------------------------------------------------------------ No results found for this basename: TSH, T4TOTAL, FREET3, T3FREE, THYROIDAB,  in the last 72 hours ------------------------------------------------------------------------------------------------------------------ No results found for this basename: VITAMINB12, FOLATE, FERRITIN, TIBC, IRON, RETICCTPCT,  in the last 72 hours  Coagulation profile  Recent Labs Lab 07/13/13 0049 07/14/13 0525 07/15/13 0624 07/16/13 0605  INR 2.57* 2.73* 2.53* 2.44*    No results found for this basename: DDIMER,  in the last 72 hours  Cardiac Enzymes No results found for this basename: CK, CKMB, TROPONINI,  MYOGLOBIN,  in the last 168 hours ------------------------------------------------------------------------------------------------------------------ No components found with this basename: POCBNP,      Time Spent in minutes   35   Rayan Dyal K M.D on 07/16/2013 at 9:12 AM  Between 7am to 7pm - Pager - [phone]  After 7pm go to [email or web address] - password [email or web address]  And look for the night coverage person covering for me after hours  [facility] Office  [phone]

## 2013-07-16 NOTE — Progress Notes (Signed)
Patient ID: Grant Lara, male   DOB: 17-Aug-1940, 73 y.o.   MRN: [id number] [facility] Gastroenterology Progress Note  Grant Lara 73 y.o. 1940/08/21   Subjective: Lying in bed talking on phone. Wants to eat. NG out. Reports passing flatus. No BMs.  Objective: Vital signs: Filed Vitals:   07/16/13 1013  BP: 147/73  Pulse: 78  Temp: 97.7 F (36.5 C)  Resp: 20    Physical Exam: Gen: alert, no acute distress  Abd: diffusely tender with guarding, flat, nondistended  Lab Results:  Recent Labs  07/15/13 0624 07/16/13 0605  NA 141 139  K 4.3 5.4*  CL 102 104  CO2 25 19  GLUCOSE 180* 155*  BUN 21 29*  CREATININE 1.22 1.16  CALCIUM 9.1 9.0    Recent Labs  07/14/13 0525  AST 13  ALT 8  ALKPHOS 108  BILITOT 1.3*  PROT 6.5  ALBUMIN 2.8*    Recent Labs  07/14/13 0525 07/15/13 0624  WBC 8.0 15.3*  HGB 14.9 15.4  HCT 43.7 44.4  MCV 92.4 91.9  PLT 163 158      Assessment/Plan: 73 yo with SBO question Crohn's vs adhesions. Diet recs per surgery. Continue IV steroids. Supportive care. No role for colonoscopy at this time. Will follow.   [place] C. 07/16/2013, 11:22 AM

## 2013-07-17 ENCOUNTER — Inpatient Hospital Stay (HOSPITAL_COMMUNITY): Payer: Medicare Other

## 2013-07-17 LAB — PROTIME-INR
INR: 2.24 — ABNORMAL HIGH (ref 0.00–1.49)
Prothrombin Time: 24.1 seconds — ABNORMAL HIGH (ref 11.6–15.2)

## 2013-07-17 LAB — CBC
HCT: 42.4 % (ref 39.0–52.0)
HEMOGLOBIN: 14.5 g/dL (ref 13.0–17.0)
MCH: 31.9 pg (ref 26.0–34.0)
MCHC: 34.2 g/dL (ref 30.0–36.0)
MCV: 93.2 fL (ref 78.0–100.0)
Platelets: 166 10*3/uL (ref 150–400)
RBC: 4.55 MIL/uL (ref 4.22–5.81)
RDW: 13.3 % (ref 11.5–15.5)
WBC: 20.3 10*3/uL — ABNORMAL HIGH (ref 4.0–10.5)

## 2013-07-17 LAB — BASIC METABOLIC PANEL
BUN: 24 mg/dL — AB (ref 6–23)
CHLORIDE: 101 meq/L (ref 96–112)
CO2: 21 mEq/L (ref 19–32)
Calcium: 8.6 mg/dL (ref 8.4–10.5)
Creatinine, Ser: 1.12 mg/dL (ref 0.50–1.35)
GFR calc Af Amer: 73 mL/min — ABNORMAL LOW (ref >90)
GFR calc non Af Amer: 63 mL/min — ABNORMAL LOW (ref >90)
GLUCOSE: 153 mg/dL — AB (ref 70–99)
POTASSIUM: 5.6 meq/L — AB (ref 3.7–5.3)
Sodium: 137 mEq/L (ref 137–147)

## 2013-07-17 LAB — GLUCOSE, CAPILLARY
GLUCOSE-CAPILLARY: 138 mg/dL — AB (ref 70–99)
Glucose-Capillary: 133 mg/dL — ABNORMAL HIGH (ref 70–99)
Glucose-Capillary: 144 mg/dL — ABNORMAL HIGH (ref 70–99)
Glucose-Capillary: 151 mg/dL — ABNORMAL HIGH (ref 70–99)
Glucose-Capillary: 165 mg/dL — ABNORMAL HIGH (ref 70–99)
Glucose-Capillary: 175 mg/dL — ABNORMAL HIGH (ref 70–99)

## 2013-07-17 LAB — POTASSIUM: Potassium: 4.5 mEq/L (ref 3.7–5.3)

## 2013-07-17 MED ORDER — PREDNISONE 20 MG PO TABS
40.0000 mg | ORAL_TABLET | Freq: Every day | ORAL | Status: DC
  Administered 2013-07-18: 40 mg via ORAL
  Filled 2013-07-17 (×3): qty 2

## 2013-07-17 MED ORDER — METHYLPREDNISOLONE SODIUM SUCC 125 MG IJ SOLR
60.0000 mg | Freq: Two times a day (BID) | INTRAMUSCULAR | Status: AC
  Administered 2013-07-17: 60 mg via INTRAVENOUS

## 2013-07-17 MED ORDER — SODIUM POLYSTYRENE SULFONATE 15 GM/60ML PO SUSP
30.0000 g | Freq: Once | ORAL | Status: AC
  Administered 2013-07-17: 30 g via ORAL
  Filled 2013-07-17: qty 120

## 2013-07-17 MED ORDER — LISINOPRIL 2.5 MG PO TABS: 2.5000 mg | ORAL_TABLET | Freq: Every day | ORAL | Status: DC

## 2013-07-17 NOTE — Progress Notes (Signed)
Subjective: He says he feels much better, taking clears, and 2 BM's yesterday.  No complaint.  Objective: Vital signs in last 24 hours: Temp:  [97.6 F (36.4 C)-98 F (36.7 C)] 97.6 F (36.4 C) (04/04 0500) Pulse Rate:  [48-78] 48 (04/04 0500) Resp:  [18-20] 20 (04/04 0500) BP: (132-147)/(61-80) 147/61 mmHg (04/04 0500) SpO2:  [97 %-100 %] 98 % (04/04 0500) Last BM Date: 2013-07-23 1680 PO recorded  Diet:  clears 2 stools yesterday Afebrile, VSS K+ 5.6, 20.3K WBC INR 2.24 Intake/Output from previous day: 24-Jul-2022 0701 - 04/04 0700 In: 1680 [P.O.:1680] Out: 800 [Urine:800] Intake/Output this shift:    General appearance: alert, cooperative and no distress GI: soft, non-tender; bowel sounds normal; no masses,  no organomegaly  Lab Results:   Recent Labs  07/15/13 0624 07/17/13 0552  WBC 15.3* 20.3*  HGB 15.4 14.5  HCT 44.4 42.4  PLT 158 166    BMET  Recent Labs  07-23-2013 0605 07/23/13 1404 07/17/13 0552  NA 139  --  137  K 5.4* 5.4* 5.6*  CL 104  --  101  CO2 19  --  21  GLUCOSE 155*  --  153*  BUN 29*  --  24*  CREATININE 1.16  --  1.12  CALCIUM 9.0  --  8.6   PT/INR  Recent Labs  July 23, 2013 0605 07/17/13 0552  LABPROT 25.7* 24.1*  INR 2.44* 2.24*     Recent Labs Lab 07/13/13 0049 07/14/13 0525  AST 13 13  ALT 9 8  ALKPHOS 118* 108  BILITOT 0.7 1.3*  PROT 7.5 6.5  ALBUMIN 3.3* 2.8*     Lipase     Component Value Date/Time   LIPASE 29 07/14/2013 0525     Studies/Results: Dg Abd 2 Views  2013/07/23   CLINICAL DATA:  Small bowel obstruction.  EXAM: ABDOMEN - 2 VIEW  COMPARISON:  07/15/2013 and 07/14/2013  FINDINGS: There has been slight distal progression of the contrast in the descending colon. There are no dilated loops of large or small bowel. Contrast persists in the gallbladder.  No free air.  No osseous abnormality.  IMPRESSION: Findings consistent with ileus. Minimal progression of contrast in the descending colon.   Electronically  Signed   By: Rozetta Nunnery M.D.   On: 2013/07/23 08:12   Dg Abd 2 Views  07/15/2013   CLINICAL DATA:  Abdominal discomfort.  EXAM: ABDOMEN - 2 VIEW  COMPARISON:  DG ABD 2 VIEWS dated 07/14/2013  FINDINGS: Soft tissue structures are unremarkable. Gas pattern is nonspecific. Air-filled loops of nondistended small bowel noted. Contrast is noted colon. Contrast is noted within gallbladder. Degenerative changes lumbar spine and both hips.  IMPRESSION: Nonspecific exam.   Electronically Signed   By: Marcello Moores  Register   On: 07/15/2013 10:10    Medications: . carvedilol  3.125 mg Oral BID WC  . cloNIDine  0.1 mg Transdermal Weekly  . famotidine (PEPCID) IV  20 mg Intravenous Q12H  . latanoprost  1 drop Both Eyes QHS  . [START ON 07/18/2013] lisinopril  2.5 mg Oral Daily  . methylPREDNISolone (SOLU-MEDROL) injection  60 mg Intravenous Q12H    Assessment/Plan Recurrent SBO with hx of Crohn's and small bowel or colon resection in 1994, and 1996.  Hospitalized 01/2011, and 10/2011 with SBO  Hx of Crohn's with no treatment for some years.  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  Acute renal insuffiencey Elevated WBC on steroids.   Plan:  Full liquids, continue to mobilize and advance diet.  Steroids per GI.   LOS: 4 days    Grant Lara 07/17/2013

## 2013-07-17 NOTE — Progress Notes (Addendum)
Patient ID: Grant Lara, male   DOB: [birth year], 73 y.o.   MRN: [id number] [facility] Gastroenterology Progress Note  Denim Kalmbach Delaine 73 y.o. Sep 03, 1940   Subjective: Feels a lot better today stating that he moved his bowels twice yesterday and passed a lot of gas. Denies abdominal pain/N/V.  Objective: Vital signs in last 24 hours: Filed Vitals:   07/17/13 0500  BP: 147/61  Pulse: 48  Temp: 97.6 F (36.4 C)  Resp: 20    Physical Exam: Gen: alert, no acute distress Abd: soft, nontender, nondistended, +BS  Lab Results:  Recent Labs  07/16/13 0605 07/16/13 1404 07/17/13 0552  NA 139  --  137  K 5.4* 5.4* 5.6*  CL 104  --  101  CO2 19  --  21  GLUCOSE 155*  --  153*  BUN 29*  --  24*  CREATININE 1.16  --  1.12  CALCIUM 9.0  --  8.6   No results found for this basename: AST, ALT, ALKPHOS, BILITOT, PROT, ALBUMIN,  in the last 72 hours  Recent Labs  07/15/13 0624 07/17/13 0552  WBC 15.3* 20.3*  HGB 15.4 14.5  HCT 44.4 42.4  MCV 91.9 93.2  PLT 158 166    Recent Labs  07/16/13 0605 07/17/13 0552  LABPROT 25.7* 24.1*  INR 2.44* 2.24*      Assessment/Plan: SBO that appears to be resolving with passage of stools yesterday and continued flatus. His abdomen is much softer than yesterday. He is in great spirits today. Full liquids started by surgery this morning. Elevated WBC probably due to steroids. Will change to Prednisone starting tomorrow and advance diet slowly (soft diet starting tomorrow morning). Maybe home tomorrow if doing ok on solid foods. Will follow. Outpt colonoscopy and f/u with Dr. Cristina Gong in May.   [place] C. 07/17/2013, 10:27 AM

## 2013-07-17 NOTE — Progress Notes (Signed)
Agree with above, appears that he wont end up needing surgery

## 2013-07-17 NOTE — Progress Notes (Addendum)
Patient Demographics  Grant Lara, is a 73 y.o. male, DOB - [birth year], ZHG:[id number]  Admit date - 07/13/2013   Admitting Physician Etta Quill, DO  Outpatient Primary MD for the patient is Lynne Logan, MD  LOS - 4   Chief Complaint  Patient presents with  . 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 Clinically much improved, NG tube removed, placed on oral diet, per general surgery to observe for 24 more hours, if tolerates diet discharge tomorrow.    Assessment & Plan    1. SBO - at the previous surgical anastomosis in a patient with history of Crohn's colitis. Could represent Crohn's flareup, based on IV steroids by GI, both GI and general surgery following, had bowel movement, to be placed on full liquid diet, discussed his case with general surgeon, per general surgery of the foot 24 hours if no further nausea vomiting discharge tomorrow.    2. History of Crohn's colitis. Currently in remission not on any medications at home, GI following, now on oral prednisone per GI.    3. 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 With history of PFO. Stable, currently compensated from a cardiac standpoint, stop IV fluids we'll resume his home heart  medications which include Coreg. Note case will be started once potassium is stable.    4. 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 Due to #1 above Coumadin on hold, will monitor INR daily. Once below 2 may consider heparin drip depending on the clinical scenario.  Lab Results  Component Value Date   INR 2.24* 07/17/2013   INR 2.44* 07/16/2013   INR 2.53* 07/15/2013      5.HTN - since taking oral now discontinue Catapres patch, will place on home Coreg with as needed IV hydralazine. Resume ACE once potassium is stable     6. CK D 3. Baseline creatinine around 1.4. Monitor.     7. Mild hyperkalemia. He would dose of Kayexalate, holding ACE inhibitor till tomorrow, repeat potassium in the evening.     8 . Mild intermittent Delirium - due to acute illness and unfamiliar setting + steroids, no focal deficits, will check baseline head CT.     Code Status: Full  Family Communication:   Disposition Plan: Home   Procedures  CT Abdomen-pelvis, Ct Head   Consults  GI, CCS   Medications  Scheduled Meds: . carvedilol  3.125 mg Oral BID WC  . cloNIDine  0.1 mg Transdermal Weekly  . famotidine (PEPCID) IV  20 mg Intravenous Q12H  . latanoprost  1 drop Both Eyes QHS  . [START ON 07/19/2013] lisinopril  2.5 mg Oral Daily  . methylPREDNISolone (SOLU-MEDROL) injection  60 mg Intravenous Q12H  . [START ON 07/18/2013] predniSONE  40 mg Oral Q breakfast   Continuous Infusions:   PRN Meds:.butamben-tetracaine-benzocaine, hydrALAZINE, metoprolol, morphine injection, phenol  DVT Prophylaxis  Couamdin - SCDs    Lab Results  Component Value Date   INR 2.24* 07/17/2013   INR 2.44* 07/16/2013   INR 2.53* 07/15/2013     Lab Results  Component Value Date   PLT 166 07/17/2013    Antibiotics   Anti-infectives   None          Subjective:   [name] today has, No headache, No chest pain, improved  abdominal pain - No Nausea, No new weakness tingling or numbness,  No Cough - SOB.    Objective:   Filed Vitals:   07/16/13 2045 07/17/13 0008 07/17/13 0500 07/17/13 1107  BP: 139/68 132/67 147/61 147/70  Pulse: 52 48 48 55  Temp: 97.7 F (36.5 C) 97.8 F (36.6 C) 97.6 F (36.4 C) 97.5 F (36.4 C)  TempSrc: Oral Oral Oral Oral  Resp: 18 18 20 18   Height:      Weight:      SpO2: 98% 97% 98% 100%    Wt Readings from Last 3 Encounters:  07/12/13 71.668 kg (158 lb)  10/20/12 73.029 kg (161 lb)  05/11/12 70.308 kg (155 lb)     Intake/Output Summary (Last 24 hours) at 07/17/13 1139 Last data filed at 07/16/13 2100  Gross per 24 hour  Intake   1680 ml  Output    625 ml  Net   1055 ml     Physical Exam  Awake Alert, Oriented X 3, No new F.N deficits, Normal affect [place].AT,PERRAL Supple Neck,No JVD, No cervical lymphadenopathy appriciated.  Symmetrical Chest wall movement, Good air movement bilaterally, CTAB RRR,No Gallops,Rubs or new Murmurs, No Parasternal Heave +ve B.Sounds, Abd Soft, Non tender, No organomegaly appriciated, No rebound - guarding or rigidity. NG in place No Cyanosis, Clubbing or edema, No new Rash or bruise      Data Review   Micro Results No results found for this or any previous visit (from the past 240 hour(s)).  Radiology Reports Ct Abdomen Pelvis W Contrast  07/13/2013   CLINICAL DATA:  Mid abdominal pain with nausea and vomiting.  EXAM: CT ABDOMEN AND PELVIS WITH CONTRAST  TECHNIQUE: Multidetector CT imaging of the abdomen and pelvis was performed using the standard protocol following bolus administration of intravenous contrast.  CONTRAST:  19m OMNIPAQUE IOHEXOL 300 MG/ML  SOLN  COMPARISON:  DG ABD 1 VIEW dated 11/06/2011; CT CTA ABD/PEL W/CM AND/OR W/O CM dated 11/05/2011; CT ABD/PELV WO CM dated 01/24/2011  FINDINGS: Included view of the lung a bases demonstrates dependent atelectasis. The heart appears upper limits of normal in size, mediastinal silhouette is nonsuspicious.  Subcentimeter hypodensity in left  lobe of the liver likely reflects a cyst, the liver is otherwise unremarkable. Spleen, pancreas, adrenal glands are unremarkable. Subcentimeter gallstone.  Dilated small bowel up to 4.5 cm with transition point in the right lower quadrant associated with a small bowel surgical anastomosis, axial 54/89. Immediately above the anastomosis is circumferential wall thickening, coronal 28/76. Status post right hemicolectomy. Mild small bowel feces. Colonic diverticulosis with mild wall thickening, no superimposed inflammatory changes. No intraperitoneal free fluid or free air.  2 mm nonobstructing right interpolar renal calculus. 4 mm right interpolar renal calculus. Multiple bilateral renal cysts measuring up to 3.7 cm in the left interpolar kidney. Additional bilateral too small to characterize  hypodensities. Urinary bladder is partially distended and unremarkable. Prostate is not enlarged. Aortoiliac vessels are normal in course and caliber with mild to moderate calcific atherosclerosis, narrowing and of possible high-grade stenosis of the external iliac arteries not tailored for evaluation. No lymphadenopathy by CT size criteria. Subcentimeter mesenteric lymph nodes seen.  Moderate degenerate change the hips. Osseous structures are nonsuspicious.  IMPRESSION: Partial versus early small bowel obstruction with transition point immediately above surgical bowel anastomosis, status post right hemicolectomy. In addition, there is thickened appearance of the bowel immediately above the anastomosis and though this may be postoperative, recurrent disease may have this appearance. Recommend follow-up.  Nonobstructing right nephrolithiasis measure up to 4 mm. Cholelithiasis without cholecystitis.   Electronically Signed   By: Elon Alas   On: 07/13/2013 04:30   Dg Abd Portable 1v  07/13/2013   CLINICAL DATA:  Assess Nasogastric tube placement.  EXAM: PORTABLE ABDOMEN - 1 VIEW  COMPARISON:  None.  FINDINGS: The  esophagogastric tube tip and proximal port lie well below the expected location of the GE junction. There are loops of mildly distended gas-filled small bowel in the right mid abdomen. There is stool and gas and contrast in the descending colon. No free extraluminal gas collections are demonstrated. The lung bases exhibit no alveolar infiltrates or pleural effusions.  IMPRESSION: 1. The positioning of the nasogastric tube is nasal is radiographically good with the proximal port and tip lying well below the expected location of the GE junction. 2. The bowel gas pattern may reflect a partial small bowel obstruction.   Electronically Signed   By: David  Martinique   On: 07/13/2013 10:30    CBC  Recent Labs Lab 07/13/13 0049 07/14/13 0525 07/15/13 0624 07/17/13 0552  WBC 10.2 8.0 15.3* 20.3*  HGB 16.3 14.9 15.4 14.5  HCT 47.3 43.7 44.4 42.4  PLT 160 163 158 166  MCV 92.4 92.4 91.9 93.2  MCH 31.8 31.5 31.9 31.9  MCHC 34.5 34.1 34.7 34.2  RDW 13.2 13.2 13.2 13.3  LYMPHSABS 1.0  --   --   --   MONOABS 0.4  --   --   --   EOSABS 0.0  --   --   --   BASOSABS 0.0  --   --   --     Chemistries   Recent Labs Lab 07/13/13 0049 07/14/13 0525 07/15/13 0624 07/16/13 0605 07/16/13 1404 07/17/13 0552  NA 140 141 141 139  --  137  K 4.8 3.9 4.3 5.4* 5.4* 5.6*  CL 102 101 102 104  --  101  CO2 23 24 25 19   --  21  GLUCOSE 163* 103* 180* 155*  --  153*  BUN 16 13 21  29*  --  24*  CREATININE 1.47* 1.17 1.22 1.16  --  1.12  CALCIUM 8.7 8.7 9.1 9.0  --  8.6  AST 13 13  --   --   --   --   ALT 9 8  --   --   --   --   ALKPHOS 118* 108  --   --   --   --   BILITOT 0.7 1.3*  --   --   --   --    ------------------------------------------------------------------------------------------------------------------ estimated creatinine clearance is 57.9 ml/min (by C-G formula based on Cr of  1.12). ------------------------------------------------------------------------------------------------------------------ No results found for this basename: HGBA1C,  in the last 72 hours ------------------------------------------------------------------------------------------------------------------ No results found for this basename: CHOL, HDL, LDLCALC,  TRIG, CHOLHDL, LDLDIRECT,  in the last 72 hours ------------------------------------------------------------------------------------------------------------------ No results found for this basename: TSH, T4TOTAL, FREET3, T3FREE, THYROIDAB,  in the last 72 hours ------------------------------------------------------------------------------------------------------------------ No results found for this basename: VITAMINB12, FOLATE, FERRITIN, TIBC, IRON, RETICCTPCT,  in the last 72 hours  Coagulation profile  Recent Labs Lab 07/13/13 0049 07/14/13 0525 07/15/13 0624 07/16/13 0605 07/17/13 0552  INR 2.57* 2.73* 2.53* 2.44* 2.24*    No results found for this basename: DDIMER,  in the last 72 hours  Cardiac Enzymes No results found for this basename: CK, CKMB, TROPONINI, MYOGLOBIN,  in the last 168 hours ------------------------------------------------------------------------------------------------------------------ No components found with this basename: POCBNP,      Time Spent in minutes   35   Coralynn Gaona K M.D on 07/17/2013 at 11:39 AM  Between 7am to 7pm - Pager - [phone]  After 7pm go to [email or web address] - password [email or web address]  And look for the night coverage person covering for me after hours  [facility] Office  [phone]

## 2013-07-18 DIAGNOSIS — K5090 Crohn's disease, unspecified, without complications: Secondary | ICD-10-CM

## 2013-07-18 DIAGNOSIS — I5022 Chronic systolic (congestive) heart failure: Secondary | ICD-10-CM

## 2013-07-18 DIAGNOSIS — R410 Disorientation, unspecified: Secondary | ICD-10-CM | POA: Diagnosis present

## 2013-07-18 DIAGNOSIS — R404 Transient alteration of awareness: Secondary | ICD-10-CM

## 2013-07-18 LAB — BASIC METABOLIC PANEL
BUN: 21 mg/dL (ref 6–23)
CALCIUM: 8.3 mg/dL — AB (ref 8.4–10.5)
CHLORIDE: 103 meq/L (ref 96–112)
CO2: 24 meq/L (ref 19–32)
Creatinine, Ser: 1.1 mg/dL (ref 0.50–1.35)
GFR calc Af Amer: 74 mL/min — ABNORMAL LOW (ref >90)
GFR calc non Af Amer: 64 mL/min — ABNORMAL LOW (ref >90)
GLUCOSE: 145 mg/dL — AB (ref 70–99)
POTASSIUM: 4.7 meq/L (ref 3.7–5.3)
Sodium: 138 mEq/L (ref 137–147)

## 2013-07-18 LAB — PROTIME-INR
INR: 1.78 — ABNORMAL HIGH (ref 0.00–1.49)
Prothrombin Time: 20.2 seconds — ABNORMAL HIGH (ref 11.6–15.2)

## 2013-07-18 LAB — CBC
HEMATOCRIT: 40.9 % (ref 39.0–52.0)
HEMOGLOBIN: 14.2 g/dL (ref 13.0–17.0)
MCH: 31.7 pg (ref 26.0–34.0)
MCHC: 34.7 g/dL (ref 30.0–36.0)
MCV: 91.3 fL (ref 78.0–100.0)
Platelets: 152 10*3/uL (ref 150–400)
RBC: 4.48 MIL/uL (ref 4.22–5.81)
RDW: 12.9 % (ref 11.5–15.5)
WBC: 14.1 10*3/uL — ABNORMAL HIGH (ref 4.0–10.5)

## 2013-07-18 LAB — VITAMIN B12: Vitamin B-12: 581 pg/mL (ref 211–911)

## 2013-07-18 LAB — HIV ANTIBODY (ROUTINE TESTING W REFLEX): HIV: NONREACTIVE

## 2013-07-18 LAB — RPR: RPR Ser Ql: NONREACTIVE

## 2013-07-18 LAB — GLUCOSE, CAPILLARY
Glucose-Capillary: 120 mg/dL — ABNORMAL HIGH (ref 70–99)
Glucose-Capillary: 168 mg/dL — ABNORMAL HIGH (ref 70–99)

## 2013-07-18 LAB — TSH: TSH: 0.89 u[IU]/mL (ref 0.350–4.500)

## 2013-07-18 MED ORDER — BUTAMBEN-TETRACAINE-BENZOCAINE 2-2-14 % EX AERO: 1.0000 | INHALATION_SPRAY | Freq: Four times a day (QID) | CUTANEOUS | Status: DC | PRN

## 2013-07-18 MED ORDER — FAMOTIDINE 20 MG PO TABS
20.0000 mg | ORAL_TABLET | Freq: Two times a day (BID) | ORAL | Status: DC
  Administered 2013-07-18: 20 mg via ORAL
  Filled 2013-07-18 (×2): qty 1

## 2013-07-18 MED ORDER — PREDNISONE 20 MG PO TABS: 40.0000 mg | ORAL_TABLET | Freq: Every day | ORAL | Status: DC

## 2013-07-18 NOTE — Progress Notes (Signed)
Patient ID: Grant Lara, male   DOB: [birth year], 73 y.o.   MRN: [id number] [facility] Gastroenterology Progress Note  Grant Lara 73 y.o. [birth year]   Subjective: Sleeping but easily arousable. Denies abdominal pain/N/V/ Tolerating soft foods. No BMs overnight  Objective: Vital signs: Filed Vitals:   07/18/13 0949  BP: 119/59  Pulse: 54  Temp: 97.6 F (36.4 C)  Resp: 18    Physical Exam: Gen: alert, no acute distress  Abd: +BS, soft, minimal tenderness without guarding, nondistended  Lab Results:  Recent Labs  07/17/13 0552 07/17/13 1957 07/18/13 0410  NA 137  --  138  K 5.6* 4.5 4.7  CL 101  --  103  CO2 21  --  24  GLUCOSE 153*  --  145*  BUN 24*  --  21  CREATININE 1.12  --  1.10  CALCIUM 8.6  --  8.3*   No results found for this basename: AST, ALT, ALKPHOS, BILITOT, PROT, ALBUMIN,  in the last 72 hours  Recent Labs  07/17/13 0552 07/18/13 0410  WBC 20.3* 14.1*  HGB 14.5 14.2  HCT 42.4 40.9  MCV 93.2 91.3  PLT 166 152      Assessment/Plan: Resolving SBO - Crohn's vs adhesions. Continue to advance diet. Prednisone 40 mg/day until f/u with Dr. George Hugh. Ok to go home today from GI standpoint. F/U with Dr. Cristina Gong in 3-4 weeks. Will sign off. Dr. Paulita Fujita available to see this week if needed.   [place] C. 07/18/2013, 10:54 AM

## 2013-07-18 NOTE — Discharge Instructions (Signed)
Small Bowel Obstruction A small bowel obstruction is a blockage (obstruction) of the small intestine (small bowel). The small bowel is a long, slender tube that connects the stomach to the colon. Its job is to absorb nutrients from the fluids and foods you consume into the bloodstream.  CAUSES  There are many causes of intestinal blockage. The most common ones include:  Hernias. This is a more common cause in children than adults.  Inflammatory bowel disease (enteritis and colitis).  Twisting of the bowel (volvulus).  Tumors.  Scar tissue (adhesions) from previous surgery or radiation treatment.  Recent surgery. This may cause an acute small bowel obstruction called an ileus. SYMPTOMS   Abdominal pain. This may be dull cramps or sharp pain. It may occur in one area or may be present in the entire abdomen. Pain can range from mild to severe, depending on the degree of obstruction.  Nausea and vomiting. Vomit may be greenish or yellow bile color.  Distended or swollen stomach. Abdominal bloating is a common symptom.  Constipation.  Lack of passing gas.  Frequent belching.  Diarrhea. This may occur if runny stool is able to leak around the obstruction. DIAGNOSIS  Your caregiver can usually diagnose small bowel obstruction by taking a history, doing a physical exam, and taking X-rays. If the cause is unclear, a CT scan (computerized tomography) of your abdomen and pelvis may be needed. TREATMENT  Treatment of the blockage depends on the cause and how bad the problem is.   Sometimes, the obstruction improves with bed rest and intravenous (IV) fluids.  Resting the bowel is very important. This means following a simple diet. Sometimes, a clear liquid diet may be required for several days.  Sometimes, a small tube (nasogastric tube) is placed into the stomach to decompress the bowel. When the bowel is blocked, it usually swells up like a balloon filled with air and fluids.  Decompression means that the air and fluids are removed by suction through that tube. This can help with pain, discomfort, and nausea. It can also help the obstruction resolve faster.  Surgery may be required if other treatments do not work. Bowel obstruction from a hernia may require early surgery and can be an emergency procedure. Adhesions that cause frequent or severe obstructions may also require surgery. HOME CARE INSTRUCTIONS If your bowel obstruction is only partial or incomplete, you may be allowed to go home.  Get plenty of rest.  Follow your diet as directed by your caregiver.  Only consume clear liquids until your condition improves.  Avoid solid foods as instructed. SEEK IMMEDIATE MEDICAL CARE IF:  You have increased pain or cramping.  You vomit blood.  You have uncontrolled vomiting or nausea.  You cannot drink fluids due to vomiting or pain.  You develop confusion.  You begin feeling very dry or thirsty (dehydrated).  You have severe bloating.  You have chills.  You have a fever.  You feel extremely weak or you faint. MAKE SURE YOU:  Understand these instructions.  Will watch your condition.  Will get help right away if you are not doing well or get worse. Document Released: 06/18/2005 Document Revised: 06/24/2011 Document Reviewed: 06/15/2010 [facility] Patient Information 2014 [place].

## 2013-07-18 NOTE — Progress Notes (Signed)
Pharmacy Consult - Coumadin PTA  Coumadin has been on hold for history of PE / DVT (more than 3 years ago) INR now 1.78  Plan: Resume Coumadin?  (home dose = 5 mg daily ) Add Lovenox bridge for 2 to 3 days? Continue to follow daily INR  Thank you. Anette Guarneri, PharmD [phone]

## 2013-07-18 NOTE — Progress Notes (Signed)
D/C orders received. Pt and wife educated on d/c instructions and smoking cessation. Verbalized understanding. Pt taken downstairs by staff via wheelchair.

## 2013-07-18 NOTE — Discharge Summary (Addendum)
Physician Discharge Summary  Grant Lara DDU:[id number] DOB: Sep 11, 1940 DOA: 07/13/2013  PCP: Lynne Logan, MD  Admit date: 07/13/2013 Discharge date: 07/18/2013  Time spent: 40 minutes  Recommendations for Outpatient Follow-up:  1. Discharge home with outpt follow up with PCP and GI 2. Patient instructed to take 7.5 mg coumadin today and regular dose ( 5 mg daily). Patient's wife instrcuted to schedule appt at coumadin clinic for tomorrow or 4/7. 3. Please follow B12, TSH, RPR and HIV test ordered on 4/5. Recommend dementia w/up as outpt.  Discharge Diagnoses:  Principal Problem:   SBO (small bowel obstruction)  Active Problems:   CAD cardiomyopathy   DVT (deep venous thrombosis)   Crohn's disease   Delirium with ? 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   Hospital Course:   Small bowel obstrcution Noted for obstrcution at the previous surgical anastomosis in a patient with history of Crohn's colitis. Could represent Crohn's flare up.  placed on IV steroids by GI. both GI and general surgery following and surgery recommended for medical management.  -patient tolerating advanced diet and had BM  yesterday. Leucocytosis improved to 14 k today. NG placed on admission removed. Switched to oral prednisone 40 mg and will be discharged on this until seen by GI in 1 month.  History of Crohn's colitis.  Currently in remission not on any medications at home, GI following, now on oral prednisone per GI. Follow up in 1 month.   Chronic combined systolic and diastolic heart failure  EF around 40% in 2014, with histor to SBO. INR of 1.78 today. Will resume today at a higher dose of 7.5 mg today and then his usual dose of 5 mg daily from tomorrow. instructed wife to schedule an appt at the coumadin clinic tomorrow if not the day after at the latest for INR monitoring.  HTN  Stable. Resume home meds.    Mild hyperkalemia.  K of 5.6 on 4/4. Given a dose of kayexalate. Now stable. Resume ACEi  Mild intermittent Delirium  Noted during hospitals tay. Has not required much narcotics. On IV steroids which may have triggered. On my evaluation this morning he is confused with the day otherwise oriented. Wife reports that she has noticed him to have intermittent confusion at home for past several months. Possible for early dementia which needs to be evaluated as outpt . CT head negative for acute event. UA suggested UTI on presentation but cx was negative. does not have urinary symptoms. Will check B12 TSH, RPR and HIV and follow up as as outpt  DM  not on any meds. fsg stable. Follow up as outpt  patient clinically stable for discharge home with outpt follow up  Code Status: Full  Family Communication: spoke with wife on the phone Disposition Plan: Home     Procedures: None  Consultations:  [facility]  Discharge Exam: Filed Vitals:   07/18/13 0949  BP: 119/59  Pulse: 54  Temp: 97.6 F (36.4 C)  Resp: 18    General: elderly male in NAD HEENT: no pallor, moist oral mucosa Chest: clear b/l, no added sounds CVS: NS1&S2, no murmur, rub or gallop Abd: soft, NT, ND, BS+ Ext: warm,  no edema CNS: AAOX3 ( some confusion with the day), non focal  Discharge Instructions You were cared for by a hospitalist during your hospital stay. If you have any questions about your discharge medications or the care you received while you were in the hospital after you are discharged, you can call the unit and asked to speak with the hospitalist on call if the hospitalist that took care of you is not available. Once you are discharged, your primary care physician will handle any further medical issues. Please note that NO REFILLS for any discharge medications will be authorized once you are discharged, as it is imperative that you return to your primary care physician (or establish a relationship with a primary care physician if you do not have one) for your aftercare needs so that they can reassess your need for medications and monitor your lab values.     Medication List         butamben-tetracaine-benzocaine 05-17-12 % spray  Commonly known as:  CETACAINE  Apply 1 spray topically 4 (four) times daily as needed (throat irritation).     carvedilol 3.125 MG tablet  Commonly known as:  COREG  Take 1 tablet (3.125 mg total) by mouth 2 (two) times daily.     latanoprost 0.005 % ophthalmic solution  Commonly known as:  XALATAN  Place 1 drop into both eyes at bedtime.     lisinopril 5 MG tablet  Commonly known as:  PRINIVIL,ZESTRIL  Take 0.5 tablets (2.5 mg total) by mouth daily.     predniSONE 20 MG tablet  Commonly known as:  DELTASONE  Take 2 tablets (40 mg total) by mouth daily with breakfast.     warfarin 5 MG tablet  Commonly known as:  COUMADIN  Take 5 mg by mouth daily. Please take 7.5 mg today , then 5 mg daily.        No Known Allergies     Follow-up Information   Follow up with Lynne Logan, MD. Schedule an appointment as soon as possible for a visit in 1 week.   Specialty:  Family Medicine   Contact information:   [address], [place]  [address] [phone]       Follow up with CCS,MD, MD. Schedule an appointment as soon as possible for a visit in 1 week.   Specialty:  General Surgery      Follow up with Lear Ng., MD. Schedule an appointment as soon as possible for a visit in 1 week.   Specialty:  Gastroenterology   Contact information:   [address] [place] [address] [phone]       Follow up with Lynne Logan, MD In 1 week.   Specialty:  Family Medicine   Contact information:   [address] [place] [address] [phone]       Follow up with [facility]. (please schedule appt in 1-2 days)    Contact information:   [address]  [place] [address]        The results of significant diagnostics from this hospitalization (including imaging, microbiology, ancillary and laboratory) are listed below for reference.    Significant Diagnostic Studies: Ct Head Wo Contrast  07/17/2013   CLINICAL DATA:  Delirium, confusion  EXAM: CT HEAD WITHOUT CONTRAST  TECHNIQUE: Contiguous axial images were obtained from the base of the skull through the vertex without contrast.  COMPARISON:  07/02/2010  FINDINGS: Similar pattern of advanced atrophy and chronic white matter ischemic change throughout the cerebral hemispheres. Remote areas of the bilateral occipital parietal and cerebellar infarcts. No acute intracranial hemorrhage, mass lesion, mass effect, definite new infarction, midline shift, herniation, hydrocephalus, or extra-axial fluid collection. Remote right thalamus lacunar infarct. Cisterns patent. Mastoids clear. No significant sinus disease. Cerumen impaction within the external auditory canals bilaterally.  IMPRESSION: Stable atrophy, chronic white matter ischemic change, and areas of remote infarction.  No acute process by noncontrast CT   Electronically Signed   By: Daryll Brod M.D.   On: 07/17/2013 14:32   Ct Abdomen Pelvis W Contrast  07/13/2013    CLINICAL DATA:  Mid abdominal pain with nausea and vomiting.  EXAM: CT ABDOMEN AND PELVIS WITH CONTRAST  TECHNIQUE: Multidetector CT imaging of the abdomen and pelvis was performed using the standard protocol following bolus administration of intravenous contrast.  CONTRAST:  13m OMNIPAQUE IOHEXOL 300 MG/ML  SOLN  COMPARISON:  DG ABD 1 VIEW dated 11/06/2011; CT CTA ABD/PEL W/CM AND/OR W/O CM dated 11/05/2011; CT ABD/PELV WO CM dated 01/24/2011  FINDINGS: Included view of the lung a bases demonstrates dependent atelectasis. The heart appears upper limits of normal in size, mediastinal silhouette is nonsuspicious.  Subcentimeter hypodensity in left lobe of the liver likely reflects a cyst, the liver is otherwise unremarkable. Spleen, pancreas, adrenal glands are unremarkable. Subcentimeter gallstone.  Dilated small bowel up to 4.5 cm with transition point in the right lower quadrant associated with a small bowel surgical anastomosis, axial 54/89. Immediately above the anastomosis is circumferential wall thickening, coronal 28/76. Status post right hemicolectomy. Mild small bowel feces. Colonic diverticulosis with mild wall thickening, no superimposed inflammatory changes. No intraperitoneal free fluid or free air.  2 mm nonobstructing right interpolar renal calculus. 4 mm right interpolar renal calculus. Multiple bilateral renal cysts measuring up to 3.7 cm in the left interpolar kidney. Additional bilateral too small to characterize hypodensities. Urinary bladder is partially distended and unremarkable. Prostate is not enlarged. Aortoiliac vessels are normal in course and caliber with mild to moderate calcific atherosclerosis, narrowing and of possible high-grade stenosis of the external iliac arteries not tailored for evaluation. No lymphadenopathy by CT size criteria. Subcentimeter mesenteric lymph nodes seen.  Moderate degenerate change the hips. Osseous structures are nonsuspicious.  IMPRESSION: Partial versus  early small bowel obstruction with transition point immediately above surgical bowel anastomosis, status post right hemicolectomy. In addition, there is thickened appearance of the bowel immediately above the anastomosis and though this may be postoperative, recurrent disease may have this appearance. Recommend follow-up.  Nonobstructing right nephrolithiasis measure up to 4 mm. Cholelithiasis without cholecystitis.   Electronically Signed   By: CElon Alas  On: 07/13/2013 04:30   Dg Abd 2 Views  07/16/2013   CLINICAL DATA:  Small bowel obstruction.  EXAM: ABDOMEN - 2 VIEW  COMPARISON:  07/15/2013 and 07/14/2013  FINDINGS: There has been slight distal progression of the contrast in the descending colon. There are no dilated loops of large  or small bowel. Contrast persists in the gallbladder.  No free air.  No osseous abnormality.  IMPRESSION: Findings consistent with ileus. Minimal progression of contrast in the descending colon.   Electronically Signed   By: Rozetta Nunnery M.D.   On: 07/16/2013 08:12   Dg Abd 2 Views  07/15/2013   CLINICAL DATA:  Abdominal discomfort.  EXAM: ABDOMEN - 2 VIEW  COMPARISON:  DG ABD 2 VIEWS dated 07/14/2013  FINDINGS: Soft tissue structures are unremarkable. Gas pattern is nonspecific. Air-filled loops of nondistended small bowel noted. Contrast is noted colon. Contrast is noted within gallbladder. Degenerative changes lumbar spine and both hips.  IMPRESSION: Nonspecific exam.   Electronically Signed   By: Marcello Moores  Register   On: 07/15/2013 10:10   Dg Abd 2 Views  07/14/2013   CLINICAL DATA:  Crohn's disease.  Improving abdominal pain.  EXAM: ABDOMEN - 2 VIEW  COMPARISON:  DG ABD PORTABLE 1V dated 07/13/2013; CT ABD/PELVIS W CM dated 07/13/2013  FINDINGS: Enteric tube remains present within the stomach. Normalizing bowel gas pattern. Excreted contrast is present within the urinary bladder. Enteric contrast has progressed into the colon. The bowel-gas pattern is nonobstructive.   IMPRESSION: .  1. Unchanged nasogastric tube. 2. Normalizing bowel gas pattern.   Electronically Signed   By: Dereck Ligas M.D.   On: 07/14/2013 08:14   Dg Abd Portable 1v  07/13/2013   CLINICAL DATA:  Assess Nasogastric tube placement.  EXAM: PORTABLE ABDOMEN - 1 VIEW  COMPARISON:  None.  FINDINGS: The esophagogastric tube tip and proximal port lie well below the expected location of the GE junction. There are loops of mildly distended gas-filled small bowel in the right mid abdomen. There is stool and gas and contrast in the descending colon. No free extraluminal gas collections are demonstrated. The lung bases exhibit no alveolar infiltrates or pleural effusions.  IMPRESSION: 1. The positioning of the nasogastric tube is nasal is radiographically good with the proximal port and tip lying well below the expected location of the GE junction. 2. The bowel gas pattern may reflect a partial small bowel obstruction.   Electronically Signed   By: David  Martinique   On: 07/13/2013 10:30    Microbiology: No results found for this or any previous visit (from the past 240 hour(s)).   Labs: Basic Metabolic Panel:  Recent Labs Lab 07/14/13 0525 07/15/13 0624 07/16/13 0605 07/16/13 1404 07/17/13 0552 07/17/13 1957 07/18/13 0410  NA 141 141 139  --  137  --  138  K 3.9 4.3 5.4* 5.4* 5.6* 4.5 4.7  CL 101 102 104  --  101  --  103  CO2 24 25 19   --  21  --  24  GLUCOSE 103* 180* 155*  --  153*  --  145*  BUN 13 21 29*  --  24*  --  21  CREATININE 1.17 1.22 1.16  --  1.12  --  1.10  CALCIUM 8.7 9.1 9.0  --  8.6  --  8.3*   Liver Function Tests:  Recent Labs Lab 07/13/13 0049 07/14/13 0525  AST 13 13  ALT 9 8  ALKPHOS 118* 108  BILITOT 0.7 1.3*  PROT 7.5 6.5  ALBUMIN 3.3* 2.8*    Recent Labs Lab 07/13/13 0049 07/14/13 0525  LIPASE 68* 29   No results found for this basename: AMMONIA,  in the last 168 hours CBC:  Recent Labs Lab 07/13/13 0049 07/14/13 0525 07/15/13 5361  07/17/13 4431  07/18/13 0410  WBC 10.2 8.0 15.3* 20.3* 14.1*  NEUTROABS 8.7*  --   --   --   --   HGB 16.3 14.9 15.4 14.5 14.2  HCT 47.3 43.7 44.4 42.4 40.9  MCV 92.4 92.4 91.9 93.2 91.3  PLT 160 163 158 166 152   Cardiac Enzymes: No results found for this basename: CKTOTAL, CKMB, CKMBINDEX, TROPONINI,  in the last 168 hours BNP: BNP (last 3 results) No results found for this basename: PROBNP,  in the last 8760 hours CBG:  Recent Labs Lab 07/17/13 0744 07/17/13 1129 07/17/13 1647 07/17/13 2130 07/18/13 0649  GLUCAP 144* 165* 133* 175* 120*       Signed:  Alfard Cochrane, [place]  [facility] 07/18/2013, 11:16 AM

## 2013-07-18 NOTE — Progress Notes (Signed)
Subjective: He is a bit confused this Am.  Confused about the days, slow mentation, I told him yesterday was Saturday and it took him some time to realize he was missing Sunday.  Appears to be taking solid  Objective: Vital signs in last 24 hours: Temp:  [97.3 F (36.3 C)-97.9 F (36.6 C)] 97.6 F (36.4 C) (04/05 0949) Pulse Rate:  [52-58] 54 (04/05 0949) Resp:  [18-20] 18 (04/05 0949) BP: (119-168)/(59-76) 119/59 mmHg (04/05 0949) SpO2:  [98 %-100 %] 100 % (04/05 0949) Last BM Date: 07/17/13 1680 PO recorded yesterday 2 stools yesterday Afebrile, VSS Labs OK  WBC is better. On lower dose of prednisone now. DIII diet Intake/Output from previous day:   Intake/Output this shift:    General appearance: alert, cooperative and no distress GI: soft, non-tender; bowel sounds normal; no masses,  no organomegaly  Lab Results:   Recent Labs  07/17/13 0552 07/18/13 0410  WBC 20.3* 14.1*  HGB 14.5 14.2  HCT 42.4 40.9  PLT 166 152    BMET  Recent Labs  07/17/13 0552 07/17/13 1957 07/18/13 0410  NA 137  --  138  K 5.6* 4.5 4.7  CL 101  --  103  CO2 21  --  24  GLUCOSE 153*  --  145*  BUN 24*  --  21  CREATININE 1.12  --  1.10  CALCIUM 8.6  --  8.3*   PT/INR  Recent Labs  07/17/13 0552 07/18/13 0410  LABPROT 24.1* 20.2*  INR 2.24* 1.78*     Recent Labs Lab 07/13/13 0049 07/14/13 0525  AST 13 13  ALT 9 8  ALKPHOS 118* 108  BILITOT 0.7 1.3*  PROT 7.5 6.5  ALBUMIN 3.3* 2.8*     Lipase     Component Value Date/Time   LIPASE 29 07/14/2013 0525     Studies/Results: Ct Head Wo Contrast  07/17/2013   CLINICAL DATA:  Delirium, confusion  EXAM: CT HEAD WITHOUT CONTRAST  TECHNIQUE: Contiguous axial images were obtained from the base of the skull through the vertex without contrast.  COMPARISON:  07/02/2010  FINDINGS: Similar pattern of advanced atrophy and chronic white matter ischemic change throughout the cerebral hemispheres. Remote areas of the  bilateral occipital parietal and cerebellar infarcts. No acute intracranial hemorrhage, mass lesion, mass effect, definite new infarction, midline shift, herniation, hydrocephalus, or extra-axial fluid collection. Remote right thalamus lacunar infarct. Cisterns patent. Mastoids clear. No significant sinus disease. Cerumen impaction within the external auditory canals bilaterally.  IMPRESSION: Stable atrophy, chronic white matter ischemic change, and areas of remote infarction.  No acute process by noncontrast CT   Electronically Signed   By: Daryll Brod M.D.   On: 07/17/2013 14:32    Medications: . carvedilol  3.125 mg Oral BID WC  . cloNIDine  0.1 mg Transdermal Weekly  . famotidine  20 mg Oral BID  . latanoprost  1 drop Both Eyes QHS  . [START ON 07/19/2013] lisinopril  2.5 mg Oral Daily  . predniSONE  40 mg Oral Q breakfast    Assessment/Plan Recurrent SBO with hx of Crohn's and small bowel or colon resection in 1994, and 1996.  Hospitalized 01/2011, and 10/2011 with SBO  Hx of Crohn's with no treatment for some years.  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  Acute renal insuffiencey  Elevated WBC on steroids.   He is doing well from our standpoint, tolerating diet, + BM, he does not need surgery, INR down and he is confused.  We will see again as needed.  LOS: 5 days    Grant Lara 07/18/2013

## 2013-07-20 ENCOUNTER — Encounter (HOSPITAL_COMMUNITY): Payer: Self-pay | Admitting: Emergency Medicine

## 2013-07-20 ENCOUNTER — Emergency Department (HOSPITAL_COMMUNITY)
Admission: EM | Admit: 2013-07-20 | Discharge: 2013-07-20 | Disposition: A | Discharge status: Home / Self Care | Payer: Medicare Other | Attending: Emergency Medicine | Admitting: Emergency Medicine

## 2013-07-20 ENCOUNTER — Emergency Department (HOSPITAL_COMMUNITY): Payer: Medicare Other

## 2013-07-20 DIAGNOSIS — I10 Essential (primary) hypertension: Secondary | ICD-10-CM | POA: Insufficient documentation

## 2013-07-20 DIAGNOSIS — Z86718 Personal history of other venous thrombosis and embolism: Secondary | ICD-10-CM | POA: Insufficient documentation

## 2013-07-20 DIAGNOSIS — Z8701 Personal history of pneumonia (recurrent): Secondary | ICD-10-CM | POA: Insufficient documentation

## 2013-07-20 DIAGNOSIS — M1990 Unspecified osteoarthritis, unspecified site: Secondary | ICD-10-CM

## 2013-07-20 DIAGNOSIS — Z87442 Personal history of urinary calculi: Secondary | ICD-10-CM | POA: Insufficient documentation

## 2013-07-20 DIAGNOSIS — Z8673 Personal history of transient ischemic attack (TIA), and cerebral infarction without residual deficits: Secondary | ICD-10-CM | POA: Insufficient documentation

## 2013-07-20 DIAGNOSIS — Z8719 Personal history of other diseases of the digestive system: Secondary | ICD-10-CM | POA: Insufficient documentation

## 2013-07-20 DIAGNOSIS — E119 Type 2 diabetes mellitus without complications: Secondary | ICD-10-CM | POA: Insufficient documentation

## 2013-07-20 DIAGNOSIS — Z86711 Personal history of pulmonary embolism: Secondary | ICD-10-CM | POA: Insufficient documentation

## 2013-07-20 DIAGNOSIS — Z79899 Other long term (current) drug therapy: Secondary | ICD-10-CM | POA: Insufficient documentation

## 2013-07-20 DIAGNOSIS — Z7901 Long term (current) use of anticoagulants: Secondary | ICD-10-CM | POA: Insufficient documentation

## 2013-07-20 DIAGNOSIS — F17200 Nicotine dependence, unspecified, uncomplicated: Secondary | ICD-10-CM | POA: Insufficient documentation

## 2013-07-20 DIAGNOSIS — Q2111 Secundum atrial septal defect: Secondary | ICD-10-CM | POA: Insufficient documentation

## 2013-07-20 DIAGNOSIS — M1710 Unilateral primary osteoarthritis, unspecified knee: Secondary | ICD-10-CM | POA: Insufficient documentation

## 2013-07-20 DIAGNOSIS — R269 Unspecified abnormalities of gait and mobility: Secondary | ICD-10-CM | POA: Insufficient documentation

## 2013-07-20 DIAGNOSIS — IMO0002 Reserved for concepts with insufficient information to code with codable children: Secondary | ICD-10-CM | POA: Insufficient documentation

## 2013-07-20 DIAGNOSIS — I2510 Atherosclerotic heart disease of native coronary artery without angina pectoris: Secondary | ICD-10-CM | POA: Insufficient documentation

## 2013-07-20 DIAGNOSIS — Q211 Atrial septal defect: Secondary | ICD-10-CM | POA: Insufficient documentation

## 2013-07-20 MED ORDER — HYDROCODONE-ACETAMINOPHEN 5-325 MG PO TABS
1.0000 | ORAL_TABLET | Freq: Once | ORAL | Status: AC
  Administered 2013-07-20: 1 via ORAL
  Filled 2013-07-20: qty 1

## 2013-07-20 MED ORDER — HYDROCODONE-ACETAMINOPHEN 5-325 MG PO TABS: 1.0000 | ORAL_TABLET | Freq: Four times a day (QID) | ORAL | Status: DC | PRN

## 2013-07-20 NOTE — ED Notes (Signed)
Patient transported to X-ray 

## 2013-07-20 NOTE — Discharge Instructions (Signed)
Arthritis, Nonspecific  Arthritis is pain, redness, warmth, or puffiness (inflammation) of a joint. The joint may be stiff or hurt when you move it. One or more joints may be affected. There are many types of arthritis. Your doctor may not know what type you have right away. The most common cause of arthritis is wear and tear on the joint (osteoarthritis).  HOME CARE   · Only take medicine as told by your doctor.  · Rest the joint as much as possible.  · Raise (elevate) your joint if it is puffy.  · Use crutches if the painful joint is in your leg.  · Drink enough fluids to keep your pee (urine) clear or pale yellow.  · Follow your doctor's diet instructions.  · Use cold packs for very bad joint pain for 10 to 15 minutes every hour. Ask your doctor if it is okay for you to use hot packs.  · Exercise as told by your doctor.  · Take a warm shower if you have stiffness in the morning.  · Move your sore joints throughout the day.  GET HELP RIGHT AWAY IF:   · You have a fever.  · You have very bad joint pain, puffiness, or redness.  · You have many joints that are painful and puffy.  · You are not getting better with treatment.  · You have very bad back pain or leg weakness.  · You cannot control when you poop (bowel movement) or pee (urinate).  · You do not feel better in 24 hours or are getting worse.  · You are having side effects from your medicine.  MAKE SURE YOU:   · Understand these instructions.  · Will watch your condition.  · Will get help right away if you are not doing well or get worse.  Document Released: 06/26/2009 Document Revised: 10/01/2011 Document Reviewed: 06/26/2009  [employer]® Patient Information ©2014 [employer].

## 2013-07-20 NOTE — ED Notes (Signed)
Pt reports ongoing bilateral leg pain x 1 month with increased difficulty getting up and walking. Pt normally walks without cane. Pt with hx of same. Pt denies sob.

## 2013-07-20 NOTE — ED Provider Notes (Signed)
CSN: [id number]     Arrival date & time 07/20/13  1643 History   First MD Initiated Contact with Patient 07/20/13 1859     Chief Complaint  Patient presents with  . Leg Pain     (Consider location/radiation/quality/duration/timing/severity/associated sxs/prior Treatment) Patient is a 73 y.o. male presenting with leg pain. The history is provided by the patient and the spouse.  Leg Pain Location:  Knee and leg Time since incident:  1 month Injury: no   Leg location:  L lower leg and R lower leg Knee location:  L knee and R knee Pain details:    Quality:  Dull and aching   Radiates to:  Does not radiate   Severity:  Mild   Onset quality:  Gradual   Duration:  1 month   Timing:  Intermittent   Progression:  Waxing and waning Chronicity:  Recurrent Dislocation: no   Foreign body present:  No foreign bodies Prior injury to area:  No Relieved by:  Rest Worsened by:  Activity Ineffective treatments:  None tried Associated symptoms: stiffness   Associated symptoms: no decreased ROM, no fever, no itching, no numbness, no swelling and no tingling   Risk factors: no concern for non-accidental trauma, no known bone disorder and no obesity     Past Medical History  Diagnosis Date  . Hypertension   . Dilated cardiomyopathy     2/12: EF 30-35%, trivial AI, mild RAE.  EF 2014 40 -45%  . CAD (coronary artery disease)     LHC 9/05 with Dr. Einar Gip:  dLM 20-30%, LAD 85%, oD1 20-30%.  PCI:  Taxus DES to LAD; Dx jailed and tx with POBA.  Last myoview 12/10: inf scar, no ischemia, EF 29%.  . DVT (deep venous thrombosis)   . Pulmonary embolus     chronic coumadin  . Hyperhomocystinemia   . PFO (patent foramen ovale)     Not mentioned on 2014 echo.  [name] HLD (hyperlipidemia)   . Crohn's disease   . Nephrolithiasis   . Pneumonia ~ 2011  . Diabetes mellitus     11/05/11 "borderline; don't take medications"  . Stroke 1993    "left arm can't hold steady; leg too"  . Arthritis     "used to  have a touch in my legs"   Past Surgical History  Procedure Laterality Date  . Colon surgery  1994; 1996    "for Crohn's disease"  . Appendectomy     No family history on file. History  Substance Use Topics  . Smoking status: Current Every Day Smoker -- 0.50 packs/day for 53 years    Types: Cigarettes  . Smokeless tobacco: Never Used  . Alcohol Use: No    Review of Systems  Constitutional: Negative for fever, activity change and appetite change.  HENT: Negative for congestion and rhinorrhea.   Eyes: Negative for discharge and itching.  Respiratory: Negative for cough, shortness of breath and wheezing.   Cardiovascular: Negative for chest pain.  Gastrointestinal: Negative for nausea, vomiting, abdominal pain, diarrhea and constipation.  Genitourinary: Negative for hematuria, decreased urine volume and difficulty urinating.  Musculoskeletal: Positive for gait problem and stiffness.  Skin: Negative for itching, rash and wound.  Neurological: Negative for syncope, weakness and numbness.  All other systems reviewed and are negative.      Allergies  Review of patient's allergies indicates no known allergies.  Home Medications   Current Outpatient Rx  Name  Route  Sig  Dispense  Refill  .  carvedilol (COREG) 3.125 MG tablet   Oral   Take 1 tablet (3.125 mg total) by mouth 2 (two) times daily.   60 tablet   11   . latanoprost (XALATAN) 0.005 % ophthalmic solution   Both Eyes   Place 1 drop into both eyes at bedtime.         [name] lisinopril (PRINIVIL,ZESTRIL) 5 MG tablet   Oral   Take 0.5 tablets (2.5 mg total) by mouth daily.   30 tablet   11   . predniSONE (DELTASONE) 20 MG tablet   Oral   Take 2 tablets (40 mg total) by mouth daily with breakfast.   60 tablet   0   . warfarin (COUMADIN) 5 MG tablet   Oral   Take 5 mg by mouth daily.         [name] HYDROcodone-acetaminophen (NORCO/VICODIN) 5-325 MG per tablet   Oral   Take 1 tablet by mouth every 6 (six) hours  as needed for moderate pain.   15 tablet   0    BP 127/80  Pulse 61  Temp(Src) 98 F (36.7 C) (Oral)  Resp 14  Wt 149 lb (67.586 kg)  SpO2 96% Physical Exam  Vitals reviewed. Constitutional: He is oriented to person, place, and time. He appears well-developed and well-nourished. No distress.  Pleasant male in NAD  HENT:  Head: Normocephalic and atraumatic.  Mouth/Throat: Oropharynx is clear and moist. No oropharyngeal exudate.  Eyes: Conjunctivae and EOM are normal. Pupils are equal, round, and reactive to light. Right eye exhibits no discharge. Left eye exhibits no discharge. No scleral icterus.  Neck: Normal range of motion. Neck supple.  Cardiovascular: Normal rate, regular rhythm, normal heart sounds and intact distal pulses.  Exam reveals no gallop and no friction rub.   No murmur heard. Pulmonary/Chest: Effort normal and breath sounds normal. No respiratory distress. He has no wheezes. He has no rales.  Abdominal: Soft. He exhibits no distension and no mass. There is no tenderness.  Musculoskeletal:  Pt with no ttp of b/l LEs. With ranging b/l legs, has mild crepitus and does not tolerate fully extension. Intact sensation and 2+ DTRs of patella. Able to ambulate, but favors his knee and does not full extend.  No bony ttp  Neurological: He is alert and oriented to person, place, and time. No cranial nerve deficit. He exhibits normal muscle tone. Coordination normal.  Skin: Skin is warm. No rash noted. He is not diaphoretic.    ED Course  Procedures (including critical care time) Labs Review Labs Reviewed - No data to display Imaging Review Dg Knee 2 Views Left  07/20/2013   CLINICAL DATA:  Bilateral knee pain for 1 year, worsening over the last week. Bilateral knee swelling. No known injury.  EXAM: LEFT KNEE - 1-2 VIEW  COMPARISON:  None.  FINDINGS: The mineralization and alignment are normal. There is no evidence of acute fracture or dislocation. The joint spaces are  maintained. 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 There is also soft tissue calcification superior to the patella, best seen on the lateral view and possibly associated with the quadriceps tendon. There is a small knee joint effusion. The patellar tendon appears normal. There are scattered vascular calcifications.  IMPRESSION: 1. No acute osseous findings. 2. Meniscal chondrocalcinosis. 3. Calcification anterior to the distal femur may be within the quadriceps tendon and related to tendon degeneration or prior injury. The extensor mechanism appears grossly intact.   Electronically Signed  By: Camie Patience M.D.   On: 07/20/2013 19:58   Dg Knee 2 Views Right  07/20/2013   CLINICAL DATA:  Leg pain.  EXAM: RIGHT KNEE - 1-2 VIEW  COMPARISON:  None.  FINDINGS: The mineralization and alignment are normal. There is no evidence of acute fracture or dislocation. The joint spaces are maintained. 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 No significant joint effusion is seen. There are scattered vascular calcifications.  IMPRESSION: No acute osseous findings or malalignment. Meniscal chondrocalcinosis.   Electronically Signed   By: Camie Patience M.D.   On: 07/20/2013 19:56     EKG Interpretation None      MDM   MDM: 73 y.o. AAM w/ subacute b/l leg pain. Pt states he has been having off and on lower leg pain for a month. States able to walk, but has been having problems d/t pain. Denies numbnes sor weaknes. No f/c. States pain is mostly in his knees but sometimes goes lower. B/l, and equal. No new swelling. Pt AFVSS, well appaering. Has crepitus in knees b/l. No bony ttp, no ttp of calf, no swelling, no obvious effusion or redness or warmth. No fever, making b/l septic knee unlikely. Pt able to ambulate, but difficulty extending with crepitus and pain. Likely has OA. XR b/l of knee shows arthritic changes. Pt given Norco with improvement of pain. Able to ambulate in  ED. No trauma, no bony ttp making fx unlikely. Recommend f/u w/ PCP which he has in a few days. Discharged. Care of case d/w my attending.  Final diagnoses:  Osteoarthritis    Discharged   Sol Passer, MD 07/20/13 2350

## 2013-07-21 NOTE — ED Provider Notes (Signed)
I saw and evaluated the patient, reviewed the resident's note and I agree with the findings and plan.   EKG Interpretation None        Osvaldo Shipper, MD 07/21/13 0010

## 2013-07-29 ENCOUNTER — Ambulatory Visit (INDEPENDENT_AMBULATORY_CARE_PROVIDER_SITE_OTHER): Payer: Medicare Other | Admitting: Pharmacist

## 2013-07-29 DIAGNOSIS — I2699 Other pulmonary embolism without acute cor pulmonale: Secondary | ICD-10-CM

## 2013-07-29 DIAGNOSIS — I639 Cerebral infarction, unspecified: Secondary | ICD-10-CM

## 2013-07-29 DIAGNOSIS — I82409 Acute embolism and thrombosis of unspecified deep veins of unspecified lower extremity: Secondary | ICD-10-CM

## 2013-07-29 DIAGNOSIS — Z5181 Encounter for therapeutic drug level monitoring: Secondary | ICD-10-CM

## 2013-07-29 DIAGNOSIS — I6350 Cerebral infarction due to unspecified occlusion or stenosis of unspecified cerebral artery: Secondary | ICD-10-CM

## 2013-07-29 LAB — POCT INR: INR: 4.9

## 2013-08-04 ENCOUNTER — Ambulatory Visit (INDEPENDENT_AMBULATORY_CARE_PROVIDER_SITE_OTHER): Payer: Medicare Other | Admitting: Pharmacist

## 2013-08-04 DIAGNOSIS — I82409 Acute embolism and thrombosis of unspecified deep veins of unspecified lower extremity: Secondary | ICD-10-CM

## 2013-08-04 DIAGNOSIS — I2699 Other pulmonary embolism without acute cor pulmonale: Secondary | ICD-10-CM

## 2013-08-04 DIAGNOSIS — I639 Cerebral infarction, unspecified: Secondary | ICD-10-CM

## 2013-08-04 DIAGNOSIS — Z5181 Encounter for therapeutic drug level monitoring: Secondary | ICD-10-CM

## 2013-08-04 DIAGNOSIS — I6350 Cerebral infarction due to unspecified occlusion or stenosis of unspecified cerebral artery: Secondary | ICD-10-CM

## 2013-08-04 LAB — POCT INR: INR: 2.4

## 2013-08-12 ENCOUNTER — Ambulatory Visit (INDEPENDENT_AMBULATORY_CARE_PROVIDER_SITE_OTHER): Payer: Medicare Other

## 2013-08-12 DIAGNOSIS — Z5181 Encounter for therapeutic drug level monitoring: Secondary | ICD-10-CM

## 2013-08-12 DIAGNOSIS — I82409 Acute embolism and thrombosis of unspecified deep veins of unspecified lower extremity: Secondary | ICD-10-CM

## 2013-08-12 DIAGNOSIS — I2699 Other pulmonary embolism without acute cor pulmonale: Secondary | ICD-10-CM

## 2013-08-12 DIAGNOSIS — I639 Cerebral infarction, unspecified: Secondary | ICD-10-CM

## 2013-08-12 DIAGNOSIS — I6350 Cerebral infarction due to unspecified occlusion or stenosis of unspecified cerebral artery: Secondary | ICD-10-CM

## 2013-08-12 LAB — POCT INR: INR: 2.4

## 2013-08-21 ENCOUNTER — Other Ambulatory Visit: Payer: Self-pay

## 2013-08-21 ENCOUNTER — Observation Stay (HOSPITAL_COMMUNITY)
Admission: EM | Admit: 2013-08-21 | Discharge: 2013-08-23 | Disposition: A | Discharge status: Home / Self Care | Payer: Medicare Other | Attending: Internal Medicine | Admitting: Internal Medicine

## 2013-08-21 ENCOUNTER — Encounter (HOSPITAL_COMMUNITY): Payer: Self-pay | Admitting: Emergency Medicine

## 2013-08-21 DIAGNOSIS — N179 Acute kidney failure, unspecified: Secondary | ICD-10-CM | POA: Insufficient documentation

## 2013-08-21 DIAGNOSIS — R079 Chest pain, unspecified: Secondary | ICD-10-CM

## 2013-08-21 DIAGNOSIS — M79672 Pain in left foot: Secondary | ICD-10-CM

## 2013-08-21 DIAGNOSIS — R9431 Abnormal electrocardiogram [ECG] [EKG]: Secondary | ICD-10-CM | POA: Insufficient documentation

## 2013-08-21 DIAGNOSIS — R7309 Other abnormal glucose: Principal | ICD-10-CM | POA: Insufficient documentation

## 2013-08-21 DIAGNOSIS — Z7901 Long term (current) use of anticoagulants: Secondary | ICD-10-CM | POA: Insufficient documentation

## 2013-08-21 DIAGNOSIS — Z86718 Personal history of other venous thrombosis and embolism: Secondary | ICD-10-CM | POA: Insufficient documentation

## 2013-08-21 DIAGNOSIS — J4489 Other specified chronic obstructive pulmonary disease: Secondary | ICD-10-CM | POA: Insufficient documentation

## 2013-08-21 DIAGNOSIS — M79605 Pain in left leg: Secondary | ICD-10-CM

## 2013-08-21 DIAGNOSIS — R630 Anorexia: Secondary | ICD-10-CM | POA: Insufficient documentation

## 2013-08-21 DIAGNOSIS — Q211 Atrial septal defect: Secondary | ICD-10-CM | POA: Insufficient documentation

## 2013-08-21 DIAGNOSIS — E119 Type 2 diabetes mellitus without complications: Secondary | ICD-10-CM | POA: Diagnosis present

## 2013-08-21 DIAGNOSIS — R918 Other nonspecific abnormal finding of lung field: Secondary | ICD-10-CM

## 2013-08-21 DIAGNOSIS — I82409 Acute embolism and thrombosis of unspecified deep veins of unspecified lower extremity: Secondary | ICD-10-CM

## 2013-08-21 DIAGNOSIS — I10 Essential (primary) hypertension: Secondary | ICD-10-CM | POA: Insufficient documentation

## 2013-08-21 DIAGNOSIS — M79604 Pain in right leg: Secondary | ICD-10-CM

## 2013-08-21 DIAGNOSIS — R531 Weakness: Secondary | ICD-10-CM

## 2013-08-21 DIAGNOSIS — Z86711 Personal history of pulmonary embolism: Secondary | ICD-10-CM | POA: Insufficient documentation

## 2013-08-21 DIAGNOSIS — K56609 Unspecified intestinal obstruction, unspecified as to partial versus complete obstruction: Secondary | ICD-10-CM

## 2013-08-21 DIAGNOSIS — I639 Cerebral infarction, unspecified: Secondary | ICD-10-CM

## 2013-08-21 DIAGNOSIS — Z5181 Encounter for therapeutic drug level monitoring: Secondary | ICD-10-CM

## 2013-08-21 DIAGNOSIS — R81 Glycosuria: Secondary | ICD-10-CM

## 2013-08-21 DIAGNOSIS — R5383 Other fatigue: Secondary | ICD-10-CM

## 2013-08-21 DIAGNOSIS — R410 Disorientation, unspecified: Secondary | ICD-10-CM

## 2013-08-21 DIAGNOSIS — F17200 Nicotine dependence, unspecified, uncomplicated: Secondary | ICD-10-CM | POA: Insufficient documentation

## 2013-08-21 DIAGNOSIS — IMO0002 Reserved for concepts with insufficient information to code with codable children: Secondary | ICD-10-CM | POA: Insufficient documentation

## 2013-08-21 DIAGNOSIS — M79671 Pain in right foot: Secondary | ICD-10-CM

## 2013-08-21 DIAGNOSIS — E1165 Type 2 diabetes mellitus with hyperglycemia: Secondary | ICD-10-CM | POA: Diagnosis present

## 2013-08-21 DIAGNOSIS — I509 Heart failure, unspecified: Secondary | ICD-10-CM | POA: Insufficient documentation

## 2013-08-21 DIAGNOSIS — Q2111 Secundum atrial septal defect: Secondary | ICD-10-CM | POA: Insufficient documentation

## 2013-08-21 DIAGNOSIS — I5022 Chronic systolic (congestive) heart failure: Secondary | ICD-10-CM | POA: Insufficient documentation

## 2013-08-21 DIAGNOSIS — J449 Chronic obstructive pulmonary disease, unspecified: Secondary | ICD-10-CM | POA: Insufficient documentation

## 2013-08-21 DIAGNOSIS — E785 Hyperlipidemia, unspecified: Secondary | ICD-10-CM | POA: Insufficient documentation

## 2013-08-21 DIAGNOSIS — I2699 Other pulmonary embolism without acute cor pulmonale: Secondary | ICD-10-CM

## 2013-08-21 DIAGNOSIS — I110 Hypertensive heart disease with heart failure: Secondary | ICD-10-CM | POA: Diagnosis present

## 2013-08-21 DIAGNOSIS — I428 Other cardiomyopathies: Secondary | ICD-10-CM | POA: Insufficient documentation

## 2013-08-21 DIAGNOSIS — R413 Other amnesia: Secondary | ICD-10-CM | POA: Insufficient documentation

## 2013-08-21 DIAGNOSIS — R739 Hyperglycemia, unspecified: Secondary | ICD-10-CM

## 2013-08-21 DIAGNOSIS — I2510 Atherosclerotic heart disease of native coronary artery without angina pectoris: Secondary | ICD-10-CM | POA: Insufficient documentation

## 2013-08-21 DIAGNOSIS — J439 Emphysema, unspecified: Secondary | ICD-10-CM

## 2013-08-21 DIAGNOSIS — K5090 Crohn's disease, unspecified, without complications: Secondary | ICD-10-CM | POA: Insufficient documentation

## 2013-08-21 DIAGNOSIS — E860 Dehydration: Secondary | ICD-10-CM | POA: Insufficient documentation

## 2013-08-21 DIAGNOSIS — R5381 Other malaise: Secondary | ICD-10-CM | POA: Insufficient documentation

## 2013-08-21 DIAGNOSIS — Z8673 Personal history of transient ischemic attack (TIA), and cerebral infarction without residual deficits: Secondary | ICD-10-CM | POA: Insufficient documentation

## 2013-08-21 LAB — URINE MICROSCOPIC-ADD ON

## 2013-08-21 LAB — CBC WITH DIFFERENTIAL/PLATELET
BASOS ABS: 0 10*3/uL (ref 0.0–0.1)
BASOS PCT: 0 % (ref 0–1)
Eosinophils Absolute: 0 10*3/uL (ref 0.0–0.7)
Eosinophils Relative: 0 % (ref 0–5)
HCT: 45.1 % (ref 39.0–52.0)
Hemoglobin: 15.2 g/dL (ref 13.0–17.0)
Lymphocytes Relative: 19 % (ref 12–46)
Lymphs Abs: 1.8 10*3/uL (ref 0.7–4.0)
MCH: 30.9 pg (ref 26.0–34.0)
MCHC: 33.7 g/dL (ref 30.0–36.0)
MCV: 91.7 fL (ref 78.0–100.0)
Monocytes Absolute: 0.5 10*3/uL (ref 0.1–1.0)
Monocytes Relative: 6 % (ref 3–12)
NEUTROS ABS: 6.8 10*3/uL (ref 1.7–7.7)
Neutrophils Relative %: 74 % (ref 43–77)
PLATELETS: 117 10*3/uL — AB (ref 150–400)
RBC: 4.92 MIL/uL (ref 4.22–5.81)
RDW: 12.9 % (ref 11.5–15.5)
WBC: 9.1 10*3/uL (ref 4.0–10.5)

## 2013-08-21 LAB — URINALYSIS, ROUTINE W REFLEX MICROSCOPIC
Glucose, UA: >1000 mg/dL — AB
HGB URINE DIPSTICK: NEGATIVE
Ketones, ur: 15 mg/dL — AB
Leukocytes, UA: NEGATIVE
Nitrite: NEGATIVE
Protein, ur: 30 mg/dL — AB
Specific Gravity, Urine: 1.039 — ABNORMAL HIGH (ref 1.005–1.030)
UROBILINOGEN UA: 1 mg/dL (ref 0.0–1.0)
pH: 5.5 (ref 5.0–8.0)

## 2013-08-21 LAB — COMPREHENSIVE METABOLIC PANEL
ALK PHOS: 95 U/L (ref 39–117)
ALT: 9 U/L (ref 0–53)
AST: 9 U/L (ref 0–37)
Albumin: 3 g/dL — ABNORMAL LOW (ref 3.5–5.2)
BUN: 28 mg/dL — AB (ref 6–23)
CHLORIDE: 99 meq/L (ref 96–112)
CO2: 23 mEq/L (ref 19–32)
Calcium: 9 mg/dL (ref 8.4–10.5)
Creatinine, Ser: 1.65 mg/dL — ABNORMAL HIGH (ref 0.50–1.35)
GFR calc Af Amer: 46 mL/min — ABNORMAL LOW (ref >90)
GFR calc non Af Amer: 39 mL/min — ABNORMAL LOW (ref >90)
Glucose, Bld: 334 mg/dL — ABNORMAL HIGH (ref 70–99)
Potassium: 4.6 mEq/L (ref 3.7–5.3)
SODIUM: 135 meq/L — AB (ref 137–147)
TOTAL PROTEIN: 6.4 g/dL (ref 6.0–8.3)
Total Bilirubin: 1.3 mg/dL — ABNORMAL HIGH (ref 0.3–1.2)

## 2013-08-21 LAB — CBG MONITORING, ED
Glucose-Capillary: 370 mg/dL — ABNORMAL HIGH (ref 70–99)
Glucose-Capillary: 285 mg/dL — ABNORMAL HIGH (ref 70–99)

## 2013-08-21 LAB — I-STAT CHEM 8, ED
BUN: 29 mg/dL — AB (ref 6–23)
CREATININE: 1.9 mg/dL — AB (ref 0.50–1.35)
Calcium, Ion: 1.19 mmol/L (ref 1.13–1.30)
Chloride: 100 mEq/L (ref 96–112)
GLUCOSE: 376 mg/dL — AB (ref 70–99)
HCT: 45 % (ref 39.0–52.0)
HEMOGLOBIN: 15.3 g/dL (ref 13.0–17.0)
POTASSIUM: 4.5 meq/L (ref 3.7–5.3)
Sodium: 136 mEq/L — ABNORMAL LOW (ref 137–147)
TCO2: 22 mmol/L (ref 0–100)

## 2013-08-21 LAB — PROTIME-INR
INR: 1.1 (ref 0.00–1.49)
Prothrombin Time: 14 seconds (ref 11.6–15.2)

## 2013-08-21 LAB — TROPONIN I: Troponin I: <0.3 ng/mL (ref <0.30)

## 2013-08-21 LAB — GLUCOSE, CAPILLARY: GLUCOSE-CAPILLARY: 333 mg/dL — AB (ref 70–99)

## 2013-08-21 LAB — TSH: TSH: 0.745 u[IU]/mL (ref 0.350–4.500)

## 2013-08-21 MED ORDER — ACETAMINOPHEN 650 MG RE SUPP: 650.0000 mg | Freq: Four times a day (QID) | RECTAL | Status: DC | PRN

## 2013-08-21 MED ORDER — SODIUM CHLORIDE 0.9 % IV BOLUS (SEPSIS)
800.0000 mL | Freq: Once | INTRAVENOUS | Status: AC
  Administered 2013-08-21: 800 mL via INTRAVENOUS

## 2013-08-21 MED ORDER — WARFARIN - PHARMACIST DOSING INPATIENT: Freq: Every day | Status: DC

## 2013-08-21 MED ORDER — ACETAMINOPHEN 325 MG PO TABS: 650.0000 mg | ORAL_TABLET | Freq: Four times a day (QID) | ORAL | Status: DC | PRN

## 2013-08-21 MED ORDER — WARFARIN SODIUM 10 MG PO TABS
10.0000 mg | ORAL_TABLET | ORAL | Status: AC
  Administered 2013-08-21: 10 mg via ORAL
  Filled 2013-08-21: qty 1

## 2013-08-21 MED ORDER — CARVEDILOL 3.125 MG PO TABS
3.1250 mg | ORAL_TABLET | Freq: Two times a day (BID) | ORAL | Status: DC
  Filled 2013-08-21: qty 1

## 2013-08-21 MED ORDER — ONDANSETRON HCL 4 MG PO TABS: 4.0000 mg | ORAL_TABLET | Freq: Four times a day (QID) | ORAL | Status: DC | PRN

## 2013-08-21 MED ORDER — PREDNISONE 20 MG PO TABS
40.0000 mg | ORAL_TABLET | Freq: Every day | ORAL | Status: DC
  Administered 2013-08-22: 40 mg via ORAL
  Filled 2013-08-21 (×2): qty 2

## 2013-08-21 MED ORDER — ASPIRIN EC 81 MG PO TBEC
81.0000 mg | DELAYED_RELEASE_TABLET | Freq: Every day | ORAL | Status: DC
  Administered 2013-08-22 – 2013-08-23 (×2): 81 mg via ORAL
  Filled 2013-08-21 (×2): qty 1

## 2013-08-21 MED ORDER — LATANOPROST 0.005 % OP SOLN
1.0000 [drp] | Freq: Every day | OPHTHALMIC | Status: DC
  Administered 2013-08-21 – 2013-08-22 (×2): 1 [drp] via OPHTHALMIC
  Filled 2013-08-21: qty 2.5

## 2013-08-21 MED ORDER — ALBUTEROL SULFATE (2.5 MG/3ML) 0.083% IN NEBU: 2.5000 mg | INHALATION_SOLUTION | RESPIRATORY_TRACT | Status: DC | PRN

## 2013-08-21 MED ORDER — ONDANSETRON HCL 4 MG/2ML IJ SOLN: 4.0000 mg | Freq: Four times a day (QID) | INTRAMUSCULAR | Status: DC | PRN

## 2013-08-21 MED ORDER — DOCUSATE SODIUM 100 MG PO CAPS
100.0000 mg | ORAL_CAPSULE | Freq: Two times a day (BID) | ORAL | Status: DC
  Administered 2013-08-22 – 2013-08-23 (×2): 100 mg via ORAL
  Filled 2013-08-21 (×6): qty 1

## 2013-08-21 MED ORDER — SODIUM CHLORIDE 0.9 % IV SOLN
INTRAVENOUS | Status: DC
  Administered 2013-08-21: 200 mL via INTRAVENOUS

## 2013-08-21 MED ORDER — DONEPEZIL HCL 5 MG PO TABS
5.0000 mg | ORAL_TABLET | Freq: Every day | ORAL | Status: DC
  Administered 2013-08-21 – 2013-08-22 (×2): 5 mg via ORAL
  Filled 2013-08-21 (×3): qty 1

## 2013-08-21 MED ORDER — SODIUM CHLORIDE 0.9 % IJ SOLN
3.0000 mL | Freq: Two times a day (BID) | INTRAMUSCULAR | Status: DC
  Administered 2013-08-22 – 2013-08-23 (×2): 3 mL via INTRAVENOUS

## 2013-08-21 MED ORDER — HYDROCODONE-ACETAMINOPHEN 5-325 MG PO TABS: 1.0000 | ORAL_TABLET | ORAL | Status: DC | PRN

## 2013-08-21 MED ORDER — SODIUM CHLORIDE 0.9 % IV SOLN
INTRAVENOUS | Status: AC
  Administered 2013-08-21: 23:00:00 via INTRAVENOUS

## 2013-08-21 MED ORDER — INSULIN GLARGINE 100 UNIT/ML ~~LOC~~ SOLN
10.0000 [IU] | Freq: Every day | SUBCUTANEOUS | Status: DC
  Administered 2013-08-21: 10 [IU] via SUBCUTANEOUS
  Filled 2013-08-21: qty 0.1

## 2013-08-21 MED ORDER — INSULIN ASPART 100 UNIT/ML ~~LOC~~ SOLN
0.0000 [IU] | SUBCUTANEOUS | Status: DC
  Administered 2013-08-21: 7 [IU] via SUBCUTANEOUS
  Administered 2013-08-22 (×2): 5 [IU] via SUBCUTANEOUS
  Administered 2013-08-22: 2 [IU] via SUBCUTANEOUS

## 2013-08-21 NOTE — ED Notes (Addendum)
Phlebotomy at bedside. IV team states she will be back in 5 minutes for IV insertion. Pt reminded of need for urine sample. Family at bedside.

## 2013-08-21 NOTE — Progress Notes (Addendum)
ANTICOAGULATION CONSULT NOTE - Initial Consult  Pharmacy Consult for Coumadin Indication: DVT / PE history  No Known Allergies  Patient Measurements: Height: 5' 9"  (175.3 cm) Weight: 158 lb (71.668 kg) IBW/kg (Calculated) : 70.7  Vital Signs: Temp: 97.6 F (36.4 C) (05/09 1801) Temp src: Oral (05/09 1605) BP: 111/66 mmHg (05/09 2100) Pulse Rate: 42 (05/09 2100)  Labs:  Recent Labs  08/21/13 1631 08/21/13 1816 08/21/13 1900 08/21/13 2111 08/21/13 2119  HGB 15.3  --  15.2  --   --   HCT 45.0  --  45.1  --   --   PLT  --   --  117*  --   --   LABPROT  --   --   --   --  14.0  INR  --   --   --   --  1.10  CREATININE 1.90*  --  1.65*  --   --   TROPONINI  --  <0.30  --  <0.30  --     Estimated Creatinine Clearance: 39.3 ml/min (by C-G formula based on Cr of 1.65).   Medical History: Past Medical History  Diagnosis Date  . Hypertension   . Dilated cardiomyopathy     2/12: EF 30-35%, trivial AI, mild RAE.  EF 2014 40 -45%  . CAD (coronary artery disease)     LHC 9/05 with Dr. Einar Gip:  dLM 20-30%, LAD 85%, oD1 20-30%.  PCI:  Taxus DES to LAD; Dx jailed and tx with POBA.  Last myoview 12/10: inf scar, no ischemia, EF 29%.  . DVT (deep venous thrombosis)   . Pulmonary embolus     chronic coumadin  . Hyperhomocystinemia   . PFO (patent foramen ovale)     Not mentioned on 2014 echo.  [name] HLD (hyperlipidemia)   . Crohn's disease   . Nephrolithiasis   . Pneumonia ~ 2011  . Diabetes mellitus     11/05/11 "borderline; don't take medications"  . Stroke 1993    "left arm can't hold steady; leg too"  . Arthritis     "used to have a touch in my legs"    Assessment: 73 year old male with history of PE / DVT.  Admitted with sub-therapeutic INR. Dose PTA = 5 mg daily  Goal of Therapy:  INR 2-3 Monitor platelets by anticoagulation protocol: Yes   Plan:  1) Coumadin 10 mg po x 1 dose now 2) Daily INR  Thank you. Anette Guarneri, PharmD [phone]  08/21/2013,10:26  PM

## 2013-08-21 NOTE — H&P (Signed)
PCP: Lynne Logan, MD  [facility]  Chief Complaint:  Not eating  HPI: Grant Lara is a 73 y.o. male   has a past medical history of Hypertension; Dilated cardiomyopathy; CAD (coronary artery disease); DVT (deep venous thrombosis); Pulmonary embolus; Hyperhomocystinemia; PFO (patent foramen ovale); HLD (hyperlipidemia); Crohn's disease; Nephrolithiasis; Pneumonia (~ 2011); Diabetes mellitus; Stroke (1993); and Arthritis.   Presented with  Poor appetite for the past 1-2 days, reports drinking water. Denies any chest pain or shortness of breath. No fever or chills, denies any diarrhea, no nausea and no vomiting. Patient was not aware that he is diabetic but it has been on his records since 2011. His glucose was 334 on arrival and cr was noted to be elevated up to 1.65 Patient have had increased urination and getting up at night for at least 7 times. He also have had increased thirst, blurred vision.  Patient has hx of Crohn's disease has been on prednisone for the past 1 month.  Patient has hx of cardiomyopathy last EF was 40-45% PER ECHO 2/14 has occasional leg swelling but not on lasix.  Of note patient is on Coumadin for past history of DVT and PE  Hospitalist was called for admission for dehydration, hyperglycemia and ARF  Review of Systems:    Pertinent positives include:  Poor appetite,  fatigue,   Constitutional:  No weight loss, night sweats, Fevers, chills,weight loss  HEENT:  No headaches, Difficulty swallowing,Tooth/dental problems,Sore throat,  No sneezing, itching, ear ache, nasal congestion, post nasal drip,  Cardio-vascular:  No chest pain, Orthopnea, PND, anasarca, dizziness, palpitations.no Bilateral lower extremity swelling  GI:  No heartburn, indigestion, abdominal pain, nausea, vomiting, diarrhea, change in bowel habits, loss of appetite, melena, blood in stool, hematemesis Resp:  no shortness of breath at rest. No dyspnea on exertion, No excess mucus, no  productive cough, No non-productive cough, No coughing up of blood.No change in color of mucus.No wheezing. Skin:  no rash or lesions. No jaundice GU:  no dysuria, change in color of urine, no urgency or frequency. No straining to urinate.  No flank pain.  Musculoskeletal:  No joint pain or no joint swelling. No decreased range of motion. No back pain.  Psych:  No change in mood or affect. No depression or anxiety. No memory loss.  Neuro: no localizing neurological complaints, no tingling, no weakness, no double vision, no gait abnormality, no slurred speech, no confusion  Otherwise ROS are negative except for above, 10 systems were reviewed  Past Medical History: Past Medical History  Diagnosis Date  . Hypertension   . Dilated cardiomyopathy     2/12: EF 30-35%, trivial AI, mild RAE.  EF 2014 40 -45%  . CAD (coronary artery disease)     LHC 9/05 with Dr. Einar Gip:  dLM 20-30%, LAD 85%, oD1 20-30%.  PCI:  Taxus DES to LAD; Dx jailed and tx with POBA.  Last myoview 12/10: inf scar, no ischemia, EF 29%.  . DVT (deep venous thrombosis)   . Pulmonary embolus     chronic coumadin  . Hyperhomocystinemia   . PFO (patent foramen ovale)     Not mentioned on 2014 echo.  [name] HLD (hyperlipidemia)   . Crohn's disease   . Nephrolithiasis   . Pneumonia ~ 2011  . Diabetes mellitus     11/05/11 "borderline; don't take medications"  . Stroke 1993    "left arm can't hold steady; leg too"  . Arthritis     "used to  have a touch in my legs"   Past Surgical History  Procedure Laterality Date  . Colon surgery  1994; 1996    "for Crohn's disease"  . Appendectomy       Medications: Prior to Admission medications   Medication Sig Start Date End Date Taking? Authorizing Provider  carvedilol (COREG) 3.125 MG tablet Take 1 tablet (3.125 mg total) by mouth 2 (two) times daily. 10/20/12 10/20/13 Yes Minus Breeding, MD  donepezil (ARICEPT) 5 MG tablet Take 5 mg by mouth at bedtime.   Yes Historical Provider,  MD  HYDROcodone-acetaminophen (NORCO/VICODIN) 5-325 MG per tablet Take 1 tablet by mouth every 6 (six) hours as needed for moderate pain. 07/20/13  Yes Sol Passer, MD  latanoprost (XALATAN) 0.005 % ophthalmic solution Place 1 drop into both eyes at bedtime. 06/29/13  Yes Historical Provider, MD  lisinopril (PRINIVIL,ZESTRIL) 5 MG tablet Take 0.5 tablets (2.5 mg total) by mouth daily. 10/20/12 10/20/13 Yes Minus Breeding, MD  predniSONE (DELTASONE) 20 MG tablet Take 2 tablets (40 mg total) by mouth daily with breakfast. 07/18/13  Yes Nishant Dhungel, MD  warfarin (COUMADIN) 5 MG tablet Take 5 mg by mouth daily.   Yes Historical Provider, MD    Allergies:  No Known Allergies  Social History:  Ambulatory  independently   Lives at home With family   reports that he has been smoking Cigarettes.  He has a 26.5 pack-year smoking history. He has never used smokeless tobacco. He reports that he does not drink alcohol or use illicit drugs.    Family History: family history includes CAD in his father; Diabetes type II in his brother and mother.    Physical Exam: Patient Vitals for the past 24 hrs:  BP Temp Temp src Pulse Resp SpO2 Height Weight  08/21/13 2045 113/62 mmHg - - 89 24 99 % - -  08/21/13 2015 107/61 mmHg - - 42 14 100 % - -  08/21/13 1945 144/57 mmHg - - 44 26 88 % - -  08/21/13 1900 111/72 mmHg - - - 18 - - -  08/21/13 1815 111/72 mmHg - - - - - - -  08/21/13 1801 111/68 mmHg 97.6 F (36.4 C) - 58 15 95 % 5' 9"  (1.753 m) 71.668 kg (158 lb)  08/21/13 1605 96/72 mmHg 98 F (36.7 C) Oral 98 18 89 % 5' 9"  (1.753 m) 71.668 kg (158 lb)    1. General:  in No Acute distress 2. Psychological: Alert and   Oriented 3. Head/ENT:     Dry Mucous Membranes                          Head Non traumatic, neck supple                          Normal  Dentition 4. SKIN:   decreased Skin turgor,  Skin clean Dry and intact no rash 5. Heart: Regular rate and rhythm no Murmur, Rub or gallop 6. Lungs:  Clear to auscultation bilaterally, no wheezes or crackles   7. Abdomen: Soft, non-tender, Non distended 8. Lower extremities: no clubbing, cyanosis, or edema 9. Neurologically Grossly intact, moving all 4 extremities equally 10. MSK: Normal range of motion  body mass index is 23.32 kg/(m^2).   Labs on Admission:   Recent Labs  08/21/13 1631 08/21/13 1900  NA 136* 135*  K 4.5 4.6  CL 100 99  CO2  --  23  GLUCOSE 376* 334*  BUN 29* 28*  CREATININE 1.90* 1.65*  CALCIUM  --  9.0    Recent Labs  08/21/13 1900  AST 9  ALT 9  ALKPHOS 95  BILITOT 1.3*  PROT 6.4  ALBUMIN 3.0*   No results found for this basename: LIPASE, AMYLASE,  in the last 72 hours  Recent Labs  08/21/13 1631 08/21/13 1900  WBC  --  9.1  NEUTROABS  --  6.8  HGB 15.3 15.2  HCT 45.0 45.1  MCV  --  91.7  PLT  --  117*    Recent Labs  08/21/13 1816  TROPONINI <0.30   No results found for this basename: TSH, T4TOTAL, FREET3, T3FREE, THYROIDAB,  in the last 72 hours No results found for this basename: VITAMINB12, FOLATE, FERRITIN, TIBC, IRON, RETICCTPCT,  in the last 72 hours Lab Results  Component Value Date   HGBA1C  Value: 6.8 (NOTE)                                                                       According to the ADA Clinical Practice Recommendations for 2011, when HbA1c is used as a screening test:   >=6.5%   Diagnostic of Diabetes Mellitus           (if abnormal result  is confirmed)  5.7-6.4%   Increased risk of developing Diabetes Mellitus  References:Diagnosis and Classification of Diabetes Mellitus,Diabetes [id number],9767,34(LPFXT 1):S62-S69 and Standards of Medical Care in         Diabetes - 2011,Diabetes [id number],32  (Suppl 1):S11-S61.* 07/03/2010    Estimated Creatinine Clearance: 39.3 ml/min (by C-G formula based on Cr of 1.65). ABG    Component Value Date/Time   TCO2 22 08/21/2013 1631     Lab Results  Component Value Date   DDIMER  Value: 0.22        AT THE INHOUSE ESTABLISHED  CUTOFF VALUE OF 0.48 ug/mL FEU, THIS ASSAY HAS BEEN DOCUMENTED IN THE LITERATURE TO HAVE A SENSITIVITY AND NEGATIVE PREDICTIVE VALUE OF AT LEAST 98 TO 99%.  THE TEST RESULT SHOULD BE CORRELATED WITH AN ASSESSMENT OF THE CLINICAL PROBABILITY OF DVT / VTE. 02/18/2010     Other results:  I have pearsonaly reviewed this: ECG REPORT  Rate: 58  Rhythm: SR evidence of hypertrophy ST&T Change: ST elevation in V1-V6 no reciprocal changes, similar ECG from April  UA concentrated, glucose >1000  BNP (last 3 results) No results found for this basename: PROBNP,  in the last 8760 hours  Filed Weights   08/21/13 1605 08/21/13 1801  Weight: 71.668 kg (158 lb) 71.668 kg (158 lb)     Cultures:    Component Value Date/Time   SDES URINE, RANDOM 01/23/2011 2304   SPECREQUEST NONE 01/23/2011 2304   CULT NO GROWTH 01/23/2011 2304   REPTSTATUS 01/25/2011 FINAL 01/23/2011 2304         Radiological Exams on Admission: No results found.  Chart has been reviewed  Assessment/Plan  73 year old gentleman with history of cardiomyopathy, coronary artery disease and diet-controlled diabetes who was recently started on prednisone for Crohn's disease after which his diabetes became poorly controlled presents with hyperglycemia and dehydration  Present on Admission:  . Diabetes type 2,  uncontrolled - after IV fluids blood sugars now down to 200 range. Given dehydration will admit give gentle IV fluids given hypoglycemia start on Lantus 10 units while in steroids. Sliding scale check hemoglobin A1c  . C O P D -  albuterol as needed currently stable  . Chronic systolic heart failure - currently appears to be fluid down will give gentle IV fluids keep an eye on fluid status  . CORONARY ARTERY DISEASE S/P LAD DES/PTCA 2005 - given abnormal EKG but no chest pain and negative troponin we'll monitor on telemetry cycle cardiac enzymes.  . Essential hypertension, benign - hold lisinopril given acute renal failure  Coreg with holding parameters and slight bradycardia  . TOBACCO ABUSE - recommended cessation  . Dehydration - we'll give IV fluids patient appears to have poor by mouth intake. We'll check prealbumin if evidence of malnutrition with nutritional consult  . ARF (acute renal failure) - most likely secondary to dehydration will check urine electrolytes  . Abnormal ECG - cycle cardiac enzymes patient is chest pain-free he troponins and negative. ST elevation MI unlikely most likely EKG consistent with hypertrophy. Will obtain echogram   history of small bowel obstruction currently doing well History of DVT continue Coumadin per pharmacy no evidence of bleeding Prophylaxis: coumadin, Protonix  CODE STATUS:  FULL CODE    Other plan as per orders.  I have spent a total of 55 min on this admission  Matia Zelada 08/21/2013, 9:11 PM

## 2013-08-21 NOTE — ED Notes (Signed)
Introduced self to Pt and Family, Pt sitting up in bed, appears in no distress, will start IV line.

## 2013-08-21 NOTE — ED Notes (Signed)
Pt c/o decrease appetite onset yesterday and generalized weakness. Grips R>L, no facial droop, no arm drift, speech clear.

## 2013-08-21 NOTE — Progress Notes (Signed)
Notified Dr. Roel Cluck that pt's HR is running in the 40's on telemetry. Pt is asymptomatic. MD gave order to d/c coreg. No other orders given. Will continue to monitor pt. Ranelle Oyster, RN

## 2013-08-21 NOTE — ED Notes (Signed)
Pt transferred to [place]. Report given to [employer].

## 2013-08-21 NOTE — ED Provider Notes (Signed)
CSN: [id number]     Arrival date & time 08/21/13  1542 History   First MD Initiated Contact with Patient 08/21/13 1802     Chief Complaint  Patient presents with  . Weakness     (Consider location/radiation/quality/duration/timing/severity/associated sxs/prior Treatment) HPI Patient presents to the emergency department today with his wife he reports he has been getting diffusely weak. He also has not eaten today. He states he is hungry but "I don't know what I want to ED". He states he still walking but he is weak. He also states last week he started having polyuria. He states last night he went to the bathroom 7 times. His wife reports he seems more confused. He denies having any pain. He denies nausea, vomiting, coughing, or shortness of breath. He has had a sore throat since he had NG tube in March when he had a bowel obstruction. He's been using over-the-counter throat spray for that. He states his mouth feels very dry. He was recently started on Aricept for memory problems.  PCP Dr Nancy Fetter  Past Medical History  Diagnosis Date  . Hypertension   . Dilated cardiomyopathy     2/12: EF 30-35%, trivial AI, mild RAE.  EF 2014 40 -45%  . CAD (coronary artery disease)     LHC 9/05 with Dr. Einar Gip:  dLM 20-30%, LAD 85%, oD1 20-30%.  PCI:  Taxus DES to LAD; Dx jailed and tx with POBA.  Last myoview 12/10: inf scar, no ischemia, EF 29%.  . DVT (deep venous thrombosis)   . Pulmonary embolus     chronic coumadin  . Hyperhomocystinemia   . PFO (patent foramen ovale)     Not mentioned on 2014 echo.  [name] HLD (hyperlipidemia)   . Crohn's disease   . Nephrolithiasis   . Pneumonia ~ 2011  . Diabetes mellitus     11/05/11 "borderline; don't take medications"  . Stroke 1993    "left arm can't hold steady; leg too"  . Arthritis     "used to have a touch in my legs"   Past Surgical History  Procedure Laterality Date  . Colon surgery  1994; 1996    "for Crohn's disease"  . Appendectomy     No family  history on file. History  Substance Use Topics  . Smoking status: Current Every Day Smoker -- 0.50 packs/day for 53 years    Types: Cigarettes  . Smokeless tobacco: Never Used  . Alcohol Use: No  lives at home Lives with spouse  Review of Systems  All other systems reviewed and are negative.     Allergies  Review of patient's allergies indicates no known allergies.  Home Medications   Prior to Admission medications   Medication Sig Start Date End Date Taking? Authorizing Provider  carvedilol (COREG) 3.125 MG tablet Take 1 tablet (3.125 mg total) by mouth 2 (two) times daily. 10/20/12 10/20/13 Yes Minus Breeding, MD  donepezil (ARICEPT) 5 MG tablet Take 5 mg by mouth at bedtime.   Yes Historical Provider, MD  HYDROcodone-acetaminophen (NORCO/VICODIN) 5-325 MG per tablet Take 1 tablet by mouth every 6 (six) hours as needed for moderate pain. 07/20/13  Yes Sol Passer, MD  latanoprost (XALATAN) 0.005 % ophthalmic solution Place 1 drop into both eyes at bedtime. 06/29/13  Yes Historical Provider, MD  lisinopril (PRINIVIL,ZESTRIL) 5 MG tablet Take 0.5 tablets (2.5 mg total) by mouth daily. 10/20/12 10/20/13 Yes Minus Breeding, MD  predniSONE (DELTASONE) 20 MG tablet Take 2 tablets (40 mg  total) by mouth daily with breakfast. 07/18/13  Yes Nishant Dhungel, MD  warfarin (COUMADIN) 5 MG tablet Take 5 mg by mouth daily.   Yes Historical Provider, MD   BP 111/72  Pulse 58  Temp(Src) 97.6 F (36.4 C) (Oral)  Resp 15  Ht 5' 9"  (1.753 m)  Wt 158 lb (71.668 kg)  BMI 23.32 kg/m2  SpO2 95%  Vital signs normal except bradycardia  Physical Exam  Nursing note and vitals reviewed. Constitutional: He is oriented to person, place, and time. He appears well-developed and well-nourished.  Non-toxic appearance. He does not appear ill. No distress.  HENT:  Head: Normocephalic and atraumatic.  Right Ear: External ear normal.  Left Ear: External ear normal.  Nose: Nose normal. No mucosal edema or  rhinorrhea.  Mouth/Throat: Oropharynx is clear and moist and mucous membranes are normal. No dental abscesses or uvula swelling.  Eyes: Conjunctivae and EOM are normal. Pupils are equal, round, and reactive to light.  Neck: Normal range of motion and full passive range of motion without pain. Neck supple.  Cardiovascular: Normal rate, regular rhythm and normal heart sounds.  Exam reveals no gallop and no friction rub.   No murmur heard. Pulmonary/Chest: Effort normal and breath sounds normal. No respiratory distress. He has no wheezes. He has no rhonchi. He has no rales. He exhibits no tenderness and no crepitus.  Abdominal: Soft. Normal appearance and bowel sounds are normal. He exhibits no distension. There is no tenderness. There is no rebound and no guarding.  Musculoskeletal: Normal range of motion. He exhibits no edema and no tenderness.  Moves all extremities well.   Neurological: He is alert and oriented to person, place, and time. He has normal strength. No cranial nerve deficit.  Skin: Skin is warm, dry and intact. No rash noted. No erythema. No pallor.  Psychiatric: He has a normal mood and affect. His speech is normal and behavior is normal. His mood appears not anxious.    ED Course  Procedures (including critical care time)  Medications  0.9 %  sodium chloride infusion (not administered)  sodium chloride 0.9 % bolus 800 mL (800 mLs Intravenous New Bag/Given 08/21/13 1928)   Patient given IV fluids he is noted to have new hyperglycemia and glucosuria possibly from the steroids that he is on. He appears to be dehydrated on exam  20:23 Dr Roel Cluck will come see patient and admit     Labs Review Results for orders placed during the hospital encounter of 08/21/13  URINALYSIS, ROUTINE W REFLEX MICROSCOPIC      Result Value Ref Range   Color, Urine AMBER (*) YELLOW   APPearance CLEAR  CLEAR   Specific Gravity, Urine 1.039 (*) 1.005 - 1.030   pH 5.5  5.0 - 8.0   Glucose, UA  >1000 (*) NEGATIVE mg/dL   Hgb urine dipstick NEGATIVE  NEGATIVE   Bilirubin Urine SMALL (*) NEGATIVE   Ketones, ur 15 (*) NEGATIVE mg/dL   Protein, ur 30 (*) NEGATIVE mg/dL   Urobilinogen, UA 1.0  0.0 - 1.0 mg/dL   Nitrite NEGATIVE  NEGATIVE   Leukocytes, UA NEGATIVE  NEGATIVE  COMPREHENSIVE METABOLIC PANEL      Result Value Ref Range   Sodium 135 (*) 137 - 147 mEq/L   Potassium 4.6  3.7 - 5.3 mEq/L   Chloride 99  96 - 112 mEq/L   CO2 23  19 - 32 mEq/L   Glucose, Bld 334 (*) 70 - 99 mg/dL  BUN 28 (*) 6 - 23 mg/dL   Creatinine, Ser 1.65 (*) 0.50 - 1.35 mg/dL   Calcium 9.0  8.4 - 10.5 mg/dL   Total Protein 6.4  6.0 - 8.3 g/dL   Albumin 3.0 (*) 3.5 - 5.2 g/dL   AST 9  0 - 37 U/L   ALT 9  0 - 53 U/L   Alkaline Phosphatase 95  39 - 117 U/L   Total Bilirubin 1.3 (*) 0.3 - 1.2 mg/dL   GFR calc non Af Amer 39 (*) >90 mL/min   GFR calc Af Amer 46 (*) >90 mL/min  CBC WITH DIFFERENTIAL      Result Value Ref Range   WBC 9.1  4.0 - 10.5 K/uL   RBC 4.92  4.22 - 5.81 MIL/uL   Hemoglobin 15.2  13.0 - 17.0 g/dL   HCT 45.1  39.0 - 52.0 %   MCV 91.7  78.0 - 100.0 fL   MCH 30.9  26.0 - 34.0 pg   MCHC 33.7  30.0 - 36.0 g/dL   RDW 12.9  11.5 - 15.5 %   Platelets 117 (*) 150 - 400 K/uL   Neutrophils Relative % 74  43 - 77 %   Neutro Abs 6.8  1.7 - 7.7 K/uL   Lymphocytes Relative 19  12 - 46 %   Lymphs Abs 1.8  0.7 - 4.0 K/uL   Monocytes Relative 6  3 - 12 %   Monocytes Absolute 0.5  0.1 - 1.0 K/uL   Eosinophils Relative 0  0 - 5 %   Eosinophils Absolute 0.0  0.0 - 0.7 K/uL   Basophils Relative 0  0 - 1 %   Basophils Absolute 0.0  0.0 - 0.1 K/uL  TROPONIN I      Result Value Ref Range   Troponin I <0.30  <0.30 ng/mL  URINE MICROSCOPIC-ADD ON      Result Value Ref Range   WBC, UA 0-2  <3 WBC/hpf   RBC / HPF 0-2  <3 RBC/hpf   Casts HYALINE CASTS (*) NEGATIVE  CBG MONITORING, ED      Result Value Ref Range   Glucose-Capillary 370 (*) 70 - 99 mg/dL   Comment 1 Notify RN    I-STAT  CHEM 8, ED      Result Value Ref Range   Sodium 136 (*) 137 - 147 mEq/L   Potassium 4.5  3.7 - 5.3 mEq/L   Chloride 100  96 - 112 mEq/L   BUN 29 (*) 6 - 23 mg/dL   Creatinine, Ser 1.90 (*) 0.50 - 1.35 mg/dL   Glucose, Bld 376 (*) 70 - 99 mg/dL   Calcium, Ion 1.19  1.13 - 1.30 mmol/L   TCO2 22  0 - 100 mmol/L   Hemoglobin 15.3  13.0 - 17.0 g/dL   HCT 45.0  39.0 - 52.0 %   Laboratory interpretation all normal except concentrated urine, hyperglycemia, glucosuria  .    Imaging Review No results found.   EKG Interpretation None       Date: 08/21/2013  Rate: 58  Rhythm: sinus bradycardia  QRS Axis: normal  Intervals: normal  ST/T Wave abnormalities: nonspecific ST/T changes  Conduction Disutrbances:LVH with strain  Narrative Interpretation:   Old EKG Reviewed: unchanged from last ekg     MDM   Final diagnoses:  Weakness  Dehydration  Confusion  Hyperglycemia  Glucosuria    Plan admission   Rolland Porter, MD, Abram Sander  Janice Norrie, MD 08/21/13 2026

## 2013-08-21 NOTE — ED Notes (Signed)
IV insertion attempted x2 without success, IV team paged.

## 2013-08-21 NOTE — ED Notes (Signed)
Pt reports increased weakness x several days. Recently admitted for SBO. Denies any abdominal pain. No pain on palpation. Pt also reports sore throat, but believes could be dt NG tube. Pt AO x4. Neuro intact.

## 2013-08-22 DIAGNOSIS — K5090 Crohn's disease, unspecified, without complications: Secondary | ICD-10-CM

## 2013-08-22 DIAGNOSIS — R739 Hyperglycemia, unspecified: Secondary | ICD-10-CM | POA: Diagnosis present

## 2013-08-22 LAB — GLUCOSE, CAPILLARY
Glucose-Capillary: 33 mg/dL — CL (ref 70–99)
Glucose-Capillary: 175 mg/dL — ABNORMAL HIGH (ref 70–99)
Glucose-Capillary: 133 mg/dL — ABNORMAL HIGH (ref 70–99)
Glucose-Capillary: 114 mg/dL — ABNORMAL HIGH (ref 70–99)
Glucose-Capillary: 194 mg/dL — ABNORMAL HIGH (ref 70–99)
Glucose-Capillary: 282 mg/dL — ABNORMAL HIGH (ref 70–99)
Glucose-Capillary: 295 mg/dL — ABNORMAL HIGH (ref 70–99)

## 2013-08-22 LAB — COMPREHENSIVE METABOLIC PANEL
ALT: 7 U/L (ref 0–53)
AST: 7 U/L (ref 0–37)
Albumin: 2.3 g/dL — ABNORMAL LOW (ref 3.5–5.2)
Alkaline Phosphatase: 75 U/L (ref 39–117)
BILIRUBIN TOTAL: 0.9 mg/dL (ref 0.3–1.2)
BUN: 22 mg/dL (ref 6–23)
CHLORIDE: 109 meq/L (ref 96–112)
CO2: 20 meq/L (ref 19–32)
Calcium: 8.1 mg/dL — ABNORMAL LOW (ref 8.4–10.5)
Creatinine, Ser: 1.23 mg/dL (ref 0.50–1.35)
GFR calc Af Amer: 65 mL/min — ABNORMAL LOW (ref >90)
GFR, EST NON AFRICAN AMERICAN: 56 mL/min — AB (ref >90)
GLUCOSE: 105 mg/dL — AB (ref 70–99)
Potassium: 4 mEq/L (ref 3.7–5.3)
SODIUM: 140 meq/L (ref 137–147)
Total Protein: 5.2 g/dL — ABNORMAL LOW (ref 6.0–8.3)

## 2013-08-22 LAB — HEMOGLOBIN A1C
Hgb A1c MFr Bld: 10.7 % — ABNORMAL HIGH (ref <5.7)
Mean Plasma Glucose: 260 mg/dL — ABNORMAL HIGH (ref <117)

## 2013-08-22 LAB — SODIUM, URINE, RANDOM: SODIUM UR: 84 meq/L

## 2013-08-22 LAB — CBC
HCT: 37.8 % — ABNORMAL LOW (ref 39.0–52.0)
Hemoglobin: 12.7 g/dL — ABNORMAL LOW (ref 13.0–17.0)
MCH: 30.8 pg (ref 26.0–34.0)
MCHC: 33.6 g/dL (ref 30.0–36.0)
MCV: 91.5 fL (ref 78.0–100.0)
PLATELETS: 97 10*3/uL — AB (ref 150–400)
RBC: 4.13 MIL/uL — ABNORMAL LOW (ref 4.22–5.81)
RDW: 13 % (ref 11.5–15.5)
WBC: 9.1 10*3/uL (ref 4.0–10.5)

## 2013-08-22 LAB — PHOSPHORUS: Phosphorus: 2 mg/dL — ABNORMAL LOW (ref 2.3–4.6)

## 2013-08-22 LAB — TROPONIN I: Troponin I: <0.3 ng/mL (ref <0.30)

## 2013-08-22 LAB — PREALBUMIN: PREALBUMIN: 13.8 mg/dL — AB (ref 17.0–34.0)

## 2013-08-22 LAB — CREATININE, URINE, RANDOM: Creatinine, Urine: 152.85 mg/dL

## 2013-08-22 LAB — MAGNESIUM: Magnesium: 1.8 mg/dL (ref 1.5–2.5)

## 2013-08-22 LAB — PROTIME-INR
INR: 1.22 (ref 0.00–1.49)
Prothrombin Time: 15.1 seconds (ref 11.6–15.2)

## 2013-08-22 MED ORDER — ENOXAPARIN SODIUM 40 MG/0.4ML ~~LOC~~ SOLN: 40.0000 mg | SUBCUTANEOUS | Status: DC

## 2013-08-22 MED ORDER — DEXTROSE 50 % IV SOLN: 50.0000 mL | Freq: Once | INTRAVENOUS | Status: AC | PRN

## 2013-08-22 MED ORDER — ENOXAPARIN SODIUM 80 MG/0.8ML ~~LOC~~ SOLN
65.0000 mg | Freq: Two times a day (BID) | SUBCUTANEOUS | Status: DC
  Administered 2013-08-22 – 2013-08-23 (×2): 65 mg via SUBCUTANEOUS
  Filled 2013-08-22 (×4): qty 0.8

## 2013-08-22 MED ORDER — DEXTROSE 50 % IV SOLN
INTRAVENOUS | Status: AC
  Administered 2013-08-22: 50 mL
  Filled 2013-08-22: qty 50

## 2013-08-22 MED ORDER — PREDNISONE 20 MG PO TABS
30.0000 mg | ORAL_TABLET | Freq: Every day | ORAL | Status: DC
  Administered 2013-08-23: 30 mg via ORAL
  Filled 2013-08-22 (×2): qty 1

## 2013-08-22 MED ORDER — WARFARIN SODIUM 10 MG PO TABS
10.0000 mg | ORAL_TABLET | Freq: Once | ORAL | Status: AC
  Administered 2013-08-22: 10 mg via ORAL
  Filled 2013-08-22: qty 1

## 2013-08-22 MED ORDER — GLIPIZIDE ER 10 MG PO TB24
10.0000 mg | ORAL_TABLET | Freq: Every day | ORAL | Status: DC
  Administered 2013-08-22: 10 mg via ORAL
  Filled 2013-08-22 (×3): qty 1

## 2013-08-22 NOTE — Progress Notes (Signed)
[facility] for Coumadin Indication: DVT / PE history  No Known Allergies  Assessment: 73 year old male with history of PE / DVT.  Admitted with sub-therapeutic INR. Dose PTA = 5 mg daily  Goal of Therapy:  INR 2-3 Monitor platelets by anticoagulation protocol: Yes   Plan:  1) Coumadin 10 mg po x 1 dose  2) Daily INR  ? Need to add Lovenox bridge?       Patient Measurements: Height: 5' 10.8" (179.8 cm) Weight: 139 lb 12.8 oz (63.413 kg) IBW/kg (Calculated) : 74.84  Vital Signs: Temp: 98 F (36.7 C) (05/10 0608) Temp src: Oral (05/10 0608) BP: 108/54 mmHg (05/10 0608) Pulse Rate: 50 (05/10 0608)  Labs:  Recent Labs  08/21/13 1631 08/21/13 1816 08/21/13 1900 08/21/13 2111 08/21/13 2119 08/22/13 0817 08/22/13 0822  HGB 15.3  --  15.2  --   --  12.7*  --   HCT 45.0  --  45.1  --   --  37.8*  --   PLT  --   --  117*  --   --  97*  --   LABPROT  --   --   --   --  14.0 15.1  --   INR  --   --   --   --  1.10 1.22  --   CREATININE 1.90*  --  1.65*  --   --   --  1.23  TROPONINI  --  <0.30  --  <0.30  --   --  <0.30    Estimated Creatinine Clearance: 47.2 ml/min (by C-G formula based on Cr of 1.23).   Medical History: Past Medical History  Diagnosis Date  . Hypertension   . Dilated cardiomyopathy     2/12: EF 30-35%, trivial AI, mild RAE.  EF 2014 40 -45%  . CAD (coronary artery disease)     LHC 9/05 with Dr. Einar Gip:  dLM 20-30%, LAD 85%, oD1 20-30%.  PCI:  Taxus DES to LAD; Dx jailed and tx with POBA.  Last myoview 12/10: inf scar, no ischemia, EF 29%.  . DVT (deep venous thrombosis)   . Pulmonary embolus     chronic coumadin  . Hyperhomocystinemia   . PFO (patent foramen ovale)     Not mentioned on 2014 echo.  [name] HLD (hyperlipidemia)   . Crohn's disease   . Nephrolithiasis   . Pneumonia ~ 2011  . Diabetes mellitus     11/05/11 "borderline; don't take medications"  . Stroke 1993    "left arm can't hold steady;  leg too"  . Arthritis     "used to have a touch in my legs"     Thank you. Anette Guarneri, PharmD [phone]  08/22/2013,1:17 PM

## 2013-08-22 NOTE — Progress Notes (Signed)
Pt admitted to [facility] from ED. Pt put on telemetry running SB. Pt is A&Ox4. Pt lives at home with wife. Pt has scattered scabs to bilateral legs. Pt instructed how to use incentive spirometer and to use every 2 hours while awake. Pt stated understanding.  Pt has mild weakness to left side from prior stroke. Told pt to call for assistance before getting up. Pt stated understanding. Oriented pt to unit and room. Will continue to monitor pt. Ranelle Oyster, RN

## 2013-08-22 NOTE — Progress Notes (Signed)
Notified Dr. Roel Cluck that pt having ST elevation on EKG this morning. Pt was having ST elevation in the ED on EKG last night. Rapid response RN, April came to floor to verify that EKG is ST elevation only, April, RN stated that it was just ST elevation. Pt having no chest pain. Pt is asymptomatic. No new orders. Will continue to monitor pt. Ranelle Oyster, RN

## 2013-08-22 NOTE — Progress Notes (Addendum)
[facility] Progress Note   KARTIER BENNISON GGY:[id number] DOB: 01-Nov-1940 DOA: 08/21/2013 PCP: Lynne Logan, MD  Brief narrative: KAIRON SHOCK is a 73 y.o. male presenting on 08/21/2013 with  has a past medical history of Hypertension; Dilated cardiomyopathy; CAD, DVT/ Pulmonary embolus, PFO, Crohn's disease; Nephrolithiasis who presents with elevated sugars after being started on Prednisone to control a Chron's flare.    Subjective: No complaints  Assessment/Plan: Principal Problem:   Hyperglycemia - patient and wife state that he is not a diabetic- A1c pending - sugars better controlled after Lantus - will start Glucoltrol XL 10 mg daily and follow sugars - if sugars remain stable on this dose, will d/c home with this  Active Problems: Chron's Flare - will taper from 40 to 30 mg of Prednisone - will also start Mesalamine  AKI - likely due to dehydration from uncontrolled sugars- has resolved    Chronic systolic heart failure - compensated  H/o DVT/ PE - INR subtherapeutic - place on full dose Lovenox until therapeutic     Memory loss - cont donepezil    C O P D - stable   Code Status: Full code Family Communication: with wife Disposition Plan: home  Consultants: none  Procedures: none  Antibiotics: Antibiotics Given (last 72 hours)   None       DVT prophylaxis: Lovenox  Objective: Filed Weights   08/21/13 1605 08/21/13 1801 08/21/13 2223  Weight: 71.668 kg (158 lb) 71.668 kg (158 lb) 63.413 kg (139 lb 12.8 oz)   Blood pressure 100/48, pulse 54, temperature 97.8 F (36.6 C), temperature source Oral, resp. rate 16, height 5' 10.8" (1.798 m), weight 63.413 kg (139 lb 12.8 oz), SpO2 99.00%.  Intake/Output Summary (Last 24 hours) at 08/22/13 1534 Last data filed at 08/22/13 0654  Gross per 24 hour  Intake 843.75 ml  Output    200 ml  Net 643.75 ml     Exam: General: No acute respiratory distress Lungs: Clear to auscultation bilaterally  without wheezes or crackles Cardiovascular: Regular rate and rhythm without murmur gallop or rub normal S1 and S2 Abdomen: Nontender, nondistended, soft, bowel sounds positive, no rebound, no ascites, no appreciable mass Extremities: No significant cyanosis, clubbing, or edema bilateral lower extremities  Data Reviewed: Basic Metabolic Panel:  Recent Labs Lab 08/21/13 1631 08/21/13 1900 08/22/13 0822  NA 136* 135* 140  K 4.5 4.6 4.0  CL 100 99 109  CO2  --  23 20  GLUCOSE 376* 334* 105*  BUN 29* 28* 22  CREATININE 1.90* 1.65* 1.23  CALCIUM  --  9.0 8.1*  MG  --   --  1.8  PHOS  --   --  2.0*   Liver Function Tests:  Recent Labs Lab 08/21/13 1900 08/22/13 0822  AST 9 7  ALT 9 7  ALKPHOS 95 75  BILITOT 1.3* 0.9  PROT 6.4 5.2*  ALBUMIN 3.0* 2.3*   No results found for this basename: LIPASE, AMYLASE,  in the last 168 hours No results found for this basename: AMMONIA,  in the last 168 hours CBC:  Recent Labs Lab 08/21/13 1631 08/21/13 1900 08/22/13 0817  WBC  --  9.1 9.1  NEUTROABS  --  6.8  --   HGB 15.3 15.2 12.7*  HCT 45.0 45.1 37.8*  MCV  --  91.7 91.5  PLT  --  117* 97*   Cardiac Enzymes:  Recent Labs Lab 08/21/13 1816 08/21/13 2111 08/22/13 0822  TROPONINI <0.30 <0.30 <  0.30   BNP (last 3 results) No results found for this basename: PROBNP,  in the last 8760 hours CBG:  Recent Labs Lab 08/22/13 0425 08/22/13 0443 08/22/13 0605 08/22/13 0746 08/22/13 1144  GLUCAP 33* 175* 133* 114* 194*    No results found for this or any previous visit (from the past 240 hour(s)).   Studies:  Recent x-ray studies have been reviewed in detail by the Attending Physician  Scheduled Meds:  Scheduled Meds: . aspirin EC  81 mg Oral Daily  . docusate sodium  100 mg Oral BID  . donepezil  5 mg Oral QHS  . glipiZIDE  10 mg Oral Q breakfast  . insulin aspart  0-9 Units Subcutaneous 6 times per day  . latanoprost  1 drop Both Eyes QHS  . predniSONE  40  mg Oral Q breakfast  . sodium chloride  3 mL Intravenous Q12H  . warfarin  10 mg Oral ONCE-1800  . Warfarin - Pharmacist Dosing Inpatient   Does not apply q1800   Continuous Infusions:   Time spent on care of this patient: 35 min   Debbe Odea, MD 08/22/2013, 3:34 PM  LOS: 1 day   [facility] Office  [phone] Pager - Text Page per Shea Evans   If 7PM-7AM, please contact night-coverage [email or web address]

## 2013-08-22 NOTE — Progress Notes (Signed)
Hypoglycemic Event  CBG: 33 at 0425  Treatment: D50 IV 50 mL  Symptoms: None  Follow-up CBG: Time:0443 CBG Result:175  Possible Reasons for Event: Unknown  Comments/MD notified: Dr. Roel Cluck notified. MD stated to check CBG in 1 hour and d/c lantus.    Candace Gallus  Remember to initiate Hypoglycemia Order Set & complete

## 2013-08-23 DIAGNOSIS — I5022 Chronic systolic (congestive) heart failure: Secondary | ICD-10-CM

## 2013-08-23 LAB — GLUCOSE, CAPILLARY
GLUCOSE-CAPILLARY: 72 mg/dL (ref 70–99)
Glucose-Capillary: 103 mg/dL — ABNORMAL HIGH (ref 70–99)
Glucose-Capillary: 252 mg/dL — ABNORMAL HIGH (ref 70–99)
Glucose-Capillary: 43 mg/dL — CL (ref 70–99)
Glucose-Capillary: 49 mg/dL — ABNORMAL LOW (ref 70–99)
Glucose-Capillary: 78 mg/dL (ref 70–99)

## 2013-08-23 LAB — PROTIME-INR
INR: 1.78 — AB (ref 0.00–1.49)
Prothrombin Time: 20.2 seconds — ABNORMAL HIGH (ref 11.6–15.2)

## 2013-08-23 MED ORDER — PREDNISONE 20 MG PO TABS: 30.0000 mg | ORAL_TABLET | Freq: Every day | ORAL | Status: DC

## 2013-08-23 MED ORDER — WARFARIN SODIUM 2.5 MG PO TABS
2.5000 mg | ORAL_TABLET | Freq: Once | ORAL | Status: DC
  Filled 2013-08-23: qty 1

## 2013-08-23 MED ORDER — GLIPIZIDE 5 MG PO TABS: 5.0000 mg | ORAL_TABLET | Freq: Every day | ORAL | Status: DC

## 2013-08-23 MED ORDER — FREESTYLE SYSTEM KIT: 1.0000 | PACK | Status: DC | PRN

## 2013-08-23 MED ORDER — INSULIN ASPART 100 UNIT/ML ~~LOC~~ SOLN
0.0000 [IU] | Freq: Three times a day (TID) | SUBCUTANEOUS | Status: DC
  Administered 2013-08-23: 3 [IU] via SUBCUTANEOUS

## 2013-08-23 MED ORDER — LIVING WELL WITH DIABETES BOOK
Freq: Once | Status: DC
  Filled 2013-08-23: qty 1

## 2013-08-23 MED ORDER — GLIPIZIDE ER 10 MG PO TB24: 10.0000 mg | ORAL_TABLET | Freq: Every day | ORAL | Status: DC

## 2013-08-23 NOTE — Progress Notes (Signed)
Hypoglycemic Event  CBG: 08/22/13 2358 is 43.  Treatment: 15 GM carbohydrate snack  Symptoms: None  Follow-up CBG: Time:0027 CBG Result:78  Possible Reasons for Event: Unknown  Comments/MD notified:Notified Baltazar Najjar, NP of event. Baltazar Najjar, NP changed CBGs and insulin to ACHS.     Candace Gallus  Remember to initiate Hypoglycemia Order Set & complete

## 2013-08-23 NOTE — Care Management Note (Signed)
    Page 1 of 1   08/23/2013     5:35:42 PM CARE MANAGEMENT NOTE 08/23/2013  Patient:  Grant Lara, Grant Lara   Account Number:  [id number]  Date Initiated:  08/23/2013  Documentation initiated by:  Tomi Bamberger  Subjective/Objective Assessment:   dx dehydration, aki  sdmit- lives with spouse.     Action/Plan:   Anticipated DC Date:  08/23/2013   Anticipated DC Plan:  [place]  CM consult      Choice offered to / List presented to:             Status of service:  Completed, signed off Medicare Important Message given?  NA - LOS <3 / Initial given by admissions (If response is "NO", the following Medicare IM given date fields will be blank) Date Medicare IM given:  08/21/2013 Date Additional Medicare IM given:    Discharge Disposition:  HOME/SELF CARE  Per UR Regulation:  Reviewed for med. necessity/level of care/duration of stay  If discussed at [place] of Stay Meetings, dates discussed:    Comments:

## 2013-08-23 NOTE — Discharge Instructions (Signed)
Make sure your have your coumadin level checked in 3 days.

## 2013-08-23 NOTE — Progress Notes (Addendum)
Inpatient Diabetes Program Recommendations  AACE/ADA: New Consensus Statement on Inpatient Glycemic Control (2013)  Target Ranges:  Prepandial:   less than 140 mg/dL      Peak postprandial:   less than 180 mg/dL (1-2 hours)      Critically ill patients:  140 - 180 mg/dL     Results for Grant Lara, Grant Lara (MRN [id number]) as of 08/23/2013 09:32  Ref. Range 08/21/2013 23:22 08/22/2013 04:25 08/22/2013 04:43 08/22/2013 06:05 08/22/2013 07:46 08/22/2013 11:44 08/22/2013 16:33 08/22/2013 19:46  Glucose-Capillary Latest Range: 70-99 mg/dL 333 (H) 33 (LL) 175 (H) 133 (H) 114 (H) 194 (H) 282 (H) 295 (H)    Results for Grant Lara, Grant Lara (MRN [id number]) as of 08/23/2013 09:32  Ref. Range 08/22/2013 23:58 08/23/2013 00:27 08/23/2013 03:57 08/23/2013 07:45 08/23/2013 08:20  Glucose-Capillary Latest Range: 70-99 mg/dL 43 (LL) 78 72 49 (L) 103 (H)    Results for Grant Lara, Grant Lara (MRN [id number]) as of 08/23/2013 09:32  Ref. Range 08/22/2013 08:17  Hemoglobin A1C Latest Range: <5.7 % 10.7 (H)     Patient received 10 units Lantus and 7 units Novolog at midnight on 05/10.  Was hypoglycemic by 4am.  Lantus was d/c'd as a result.  Patient was then given 10 mg PO Glucotrol at 6pm last evening (05/10).  Was hypoglycemic at midnight and at 4am this morning.  Patient is also currently receiving Novolog Sensitive SSI.   MD- Please consider the following:  1. D/C Glucotrol 10 mg daily 2. Start Tradjenta 5 mg daily (Tradjenta has reduced risk of hypoglycemia due to its glucose dependent nature) 3. May want to consider discharging patient on Tradjenta instead of Glucotrol   1300 pm Addendum:  Attempted to speak with patient about his A1c of 10.7%.  Upon entering room, patient told me that I needed to talk to his wife instead.  Explained to patient that I would like to speak with him and will be glad to return to speak with his wife as well.  Patient appeared disinterested in anything I had to say.  Attempted to explain what his A1c  results were and attempted to explain basic DM concepts and also give information on the oral medication Glipizide.  Patient just stared at me and did not say anything.  I thanked patient for his time and called RN caring for patient to discuss.  Will follow up with patient's wife if needed.  1326 pm Addendum:  Called patient's wife by phone.  Reviewed new DM Rxs with wife.  Reviewed how to take Glucotrol, signs/symptoms of hypoglycemia, how to treat hypoglycemia, and how to check CBGs at home.  Encouraged patient's wife to ask pharmacist at [facility] to review CBG meter with her when she picks up the meter at the pharmacy.  Encouraged patient's wife to take patient to see his PCP with [facility] soon after d/c to review patient's CBG levels.  Patient's wife appreciative of my call and very receptive to the information I reviewed.  Will follow. Wyn Quaker RN, MSN, CDE Diabetes Coordinator Inpatient Diabetes Program Team Pager: [phone] (8a-10p)

## 2013-08-23 NOTE — Plan of Care (Signed)
Problem: Food- and Nutrition-Related Knowledge Deficit (NB-1.1) Goal: Nutrition education Formal process to instruct or train a patient/client in a skill or to impart knowledge to help patients/clients voluntarily manage or modify food choices and eating behavior to maintain or improve health. Outcome: Completed/Met Date Met:  08/23/13  RD consulted for nutrition education regarding diabetes. Pt declined education. He states that I should speak with his wife. Per chart review, diabetic coordinator was given same response when she tried to speak with him as well. RD spoke with wife, [name], on the phone and she is not surprised that he is refusing to talk this RD. She states that he is going to eat however he wants, despite our recommendations.    Lab Results  Component Value Date    HGBA1C 10.7* 08/22/2013    RD provided "Carbohydrate Counting for People with Diabetes" handout from the Academy of [employer] Dietetics. Discussed different food groups and their effects on blood sugar, emphasizing carbohydrate-containing foods. Provided list of carbohydrates and recommended serving sizes of common foods.  Discussed importance of controlled and consistent carbohydrate intake throughout the day. Provided examples of ways to balance meals/snacks and encouraged intake of high-fiber, whole grain complex carbohydrates. Teach back method used.  Expect poor compliance.  Body mass index is 19.62 kg/(m^2). Pt meets criteria for normal based on current BMI.  Current diet order is Carbohydrate Modified Medium. Labs and medications reviewed. No further nutrition interventions warranted at this time. RD contact information provided. If additional nutrition issues arise, please re-consult RD.  Inda Coke MS, RD, LDN Inpatient Registered Dietitian Pager: [phone] After-hours pager: [phone]

## 2013-08-23 NOTE — Progress Notes (Signed)
NURSING PROGRESS NOTE  AJAHNI NAY [id number] Discharge Data: 08/23/2013 3:55PM Attending Provider: Debbe Odea, MD PCP:SUN,VYVYAN Darreld Mclean, MD     [place] to be D/C'd Home with wife per MD order.  Discussed with the patient the After Visit Summary and all questions fully answered. All IV's discontinued with no bleeding noted. All belongings returned to patient for patient to take home.   Last Vital Signs:  Blood pressure 105/63, pulse 57, temperature 97.9 F (36.6 C), temperature source Oral, resp. rate 20, height 5' 10.8" (1.798 m), weight 63.413 kg (139 lb 12.8 oz), SpO2 99.00%.  Discharge Medication List   Medication List         carvedilol 3.125 MG tablet  Commonly known as:  COREG  Take 1 tablet (3.125 mg total) by mouth 2 (two) times daily.     donepezil 5 MG tablet  Commonly known as:  ARICEPT  Take 5 mg by mouth at bedtime.     glipiZIDE 5 MG tablet  Commonly known as:  GLUCOTROL  Take 1 tablet (5 mg total) by mouth daily before breakfast.     glucose monitoring kit monitoring kit  - 1 each by Does not apply route as needed for other. Please give 100 lancets and testing strips.   - Check sugars every morning before breakfast and every evening before dinner     HYDROcodone-acetaminophen 5-325 MG per tablet  Commonly known as:  NORCO/VICODIN  Take 1 tablet by mouth every 6 (six) hours as needed for moderate pain.     latanoprost 0.005 % ophthalmic solution  Commonly known as:  XALATAN  Place 1 drop into both eyes at bedtime.     lisinopril 5 MG tablet  Commonly known as:  PRINIVIL,ZESTRIL  Take 0.5 tablets (2.5 mg total) by mouth daily.     predniSONE 20 MG tablet  Commonly known as:  DELTASONE  Take 1.5 tablets (30 mg total) by mouth daily with breakfast.     warfarin 5 MG tablet  Commonly known as:  COUMADIN  Take 5 mg by mouth daily.         Wallie Renshaw, RN

## 2013-08-23 NOTE — Progress Notes (Signed)
ANTICOAGULATION CONSULT NOTE - Follow Up Consult  Pharmacy Consult for warfarin Indication: Hx DVT/PE  No Known Allergies  Patient Measurements: Height: 5' 10.8" (179.8 cm) Weight: 139 lb 12.8 oz (63.413 kg) IBW/kg (Calculated) : 74.84  Vital Signs: Temp: 98.7 F (37.1 C) (05/11 0355) Temp src: Oral (05/11 0355) BP: 114/63 mmHg (05/11 0355) Pulse Rate: 53 (05/11 0355)  Labs:  Recent Labs  08/21/13 1631  08/21/13 1900 08/21/13 2111 08/21/13 2119 08/22/13 0817 08/22/13 0822 08/22/13 1501 08/23/13 0616  HGB 15.3  --  15.2  --   --  12.7*  --   --   --   HCT 45.0  --  45.1  --   --  37.8*  --   --   --   PLT  --   --  117*  --   --  97*  --   --   --   LABPROT  --   --   --   --  14.0 15.1  --   --  20.2*  INR  --   --   --   --  1.10 1.22  --   --  1.78*  CREATININE 1.90*  --  1.65*  --   --   --  1.23  --   --   TROPONINI  --   < >  --  <0.30  --   --  <0.30 <0.30  --   < > = values in this interval not displayed.  Estimated Creatinine Clearance: 47.2 ml/min (by C-G formula based on Cr of 1.23).  Assessment: 76 yom continues on chronic coumadin for history of DVT and PE. INR remains subtherapeutic at 1.78 but did increase a lot from yesterday (1.22>>1.78). No bleeding noted. No new CBC today but plts had been low yesterday. He is also on lovenox while INR is subtherapeutic.   Goal of Therapy:  INR 2-3   Plan:  1. Warfarin 2.59m PO x 1 tonight 2. F/u AM INR 3. DC lovenox when INR is therapeutic 4. Check CBC in AM d/t thrombocytopenia  RRande LawmanRumbarger 08/23/2013,10:30 AM

## 2013-08-23 NOTE — Discharge Summary (Addendum)
Physician Discharge Summary  Grant Lara HER:[id number] DOB: Oct 30, 1940 DOA: 08/21/2013  PCP: Lynne Logan, MD  Admit date: 08/21/2013 Discharge date: 08/23/2013  Time spent: >45 minutes  Recommendations for Outpatient Follow-up:  1. Follow sugars and adjust Glipizide based upon sugars 2. Check INR in 3 days  Discharge Diagnoses:  Principal Problem:   Hyperglycemia Active Problems:   TOBACCO ABUSE   Essential hypertension, benign   CORONARY ARTERY DISEASE S/P LAD DES/PTCA 8563   Chronic systolic heart failure   Memory loss   C O P D   Dehydration   ARF (acute renal failure)   Abnormal ECG   Discharge Condition: stable  Diet recommendation: low carb, heart healthy  Filed Weights   08/21/13 1605 08/21/13 1801 08/21/13 2223  Weight: 71.668 kg (158 lb) 71.668 kg (158 lb) 63.413 kg (139 lb 12.8 oz)    History of present illness:  Grant Lara is a 73 y.o. male presenting on 08/21/2013 with has a past medical history of Hypertension; Dilated cardiomyopathy; CAD, DVT/ Pulmonary embolus, PFO, Crohn's disease; Nephrolithiasis who presents with elevated sugars after being started on Prednisone to control a Chron's flare.   Hospital Course:  Principal Problem:  Hyperglycemia  - patient and wife state that he is not a diabetic and elevation in sugars is essentially due to Prednisone which was started for a Chron's flare a few wks ago - A1c 5.9 -  started Glucoltrol XL 10 mg daily but sugars noted to drop < 50 - have cut back on Glipizide to 5 mg daily - he will go home with a glucometer and record his sugars- His doctor will decide how to further adjust his Glipizide   Active Problems:  Chron's Flare  - will taper from 40 to 30 mg of Prednisone  - will also start Mesalamine   AKI  - likely due to dehydration from uncontrolled sugars- has resolved   Chronic systolic heart failure  - compensated   H/o DVT/ PE  - INR subtherapeutic  - place on full dose Lovenox until therapeutic    Memory loss  - cont donepezil   C O P D  - stable     Procedures:  none  Consultations:  none  Discharge Exam: Filed Vitals:   08/23/13 1048  BP: 151/78  Pulse: 58  Temp: 98 F (36.7 C)  Resp: 18   General: No acute respiratory distress  Lungs: Clear to auscultation bilaterally without wheezes or crackles  Cardiovascular: Regular rate and rhythm without murmur gallop or rub normal S1 and S2  Abdomen: Nontender, nondistended, soft, bowel sounds positive, no rebound, no ascites, no appreciable mass  Extremities: No significant cyanosis, clubbing, or edema bilateral lower extremities   Discharge Instructions You were cared for by a hospitalist during your hospital stay. If you have any questions about your discharge medications or the care you received while you were in the hospital after you are discharged, you can call the unit and asked to speak with the hospitalist on call if the hospitalist that took care of you is not available. Once you are discharged, your primary care physician will handle any further medical issues. Please note that NO REFILLS for any discharge medications will be authorized once you are discharged, as it is imperative that you return to your primary care physician (or establish a relationship with a primary care physician if you do not have one) for your aftercare needs so that they can reassess your need for medications  and monitor your lab values.  Discharge Orders   Future Appointments Provider Department Dept Phone   08/26/2013 1:00 PM [facility] [place] [facility] [phone]   Future Orders Complete By Expires   Diet - low sodium heart healthy  As directed    Increase activity slowly  As directed        Medication List         carvedilol 3.125 MG tablet  Commonly known as:  COREG  Take 1 tablet (3.125 mg total) by mouth 2 (two) times daily.     donepezil 5 MG tablet  Commonly known as:  ARICEPT  Take  5 mg by mouth at bedtime.     glipiZIDE 5 MG tablet  Commonly known as:  GLUCOTROL  Take 1 tablet (5 mg total) by mouth daily before breakfast.     glucose monitoring kit monitoring kit  - 1 each by Does not apply route as needed for other. Please give 100 lancets and testing strips.   - Check sugars every morning before breakfast and every evening before dinner     HYDROcodone-acetaminophen 5-325 MG per tablet  Commonly known as:  NORCO/VICODIN  Take 1 tablet by mouth every 6 (six) hours as needed for moderate pain.     latanoprost 0.005 % ophthalmic solution  Commonly known as:  XALATAN  Place 1 drop into both eyes at bedtime.     lisinopril 5 MG tablet  Commonly known as:  PRINIVIL,ZESTRIL  Take 0.5 tablets (2.5 mg total) by mouth daily.     predniSONE 20 MG tablet  Commonly known as:  DELTASONE  Take 1.5 tablets (30 mg total) by mouth daily with breakfast.     warfarin 5 MG tablet  Commonly known as:  COUMADIN  Take 5 mg by mouth daily.       No Known Allergies    The results of significant diagnostics from this hospitalization (including imaging, microbiology, ancillary and laboratory) are listed below for reference.    Significant Diagnostic Studies: No results found.  Microbiology: No results found for this or any previous visit (from the past 240 hour(s)).   Labs: Basic Metabolic Panel:  Recent Labs Lab 08/21/13 1631 08/21/13 1900 08/22/13 0822  NA 136* 135* 140  K 4.5 4.6 4.0  CL 100 99 109  CO2  --  23 20  GLUCOSE 376* 334* 105*  BUN 29* 28* 22  CREATININE 1.90* 1.65* 1.23  CALCIUM  --  9.0 8.1*  MG  --   --  1.8  PHOS  --   --  2.0*   Liver Function Tests:  Recent Labs Lab 08/21/13 1900 08/22/13 0822  AST 9 7  ALT 9 7  ALKPHOS 95 75  BILITOT 1.3* 0.9  PROT 6.4 5.2*  ALBUMIN 3.0* 2.3*   No results found for this basename: LIPASE, AMYLASE,  in the last 168 hours No results found for this basename: AMMONIA,  in the last 168  hours CBC:  Recent Labs Lab 08/21/13 1631 08/21/13 1900 08/22/13 0817  WBC  --  9.1 9.1  NEUTROABS  --  6.8  --   HGB 15.3 15.2 12.7*  HCT 45.0 45.1 37.8*  MCV  --  91.7 91.5  PLT  --  117* 97*   Cardiac Enzymes:  Recent Labs Lab 08/21/13 1816 08/21/13 2111 08/22/13 0822 08/22/13 1501  TROPONINI <0.30 <0.30 <0.30 <0.30   BNP: BNP (last 3 results) No results found for this  basename: PROBNP,  in the last 8760 hours CBG:  Recent Labs Lab 08/23/13 0027 08/23/13 0357 08/23/13 0745 08/23/13 0820 08/23/13 1143  GLUCAP 78 72 49* 103* 252*       Signed:  Debbe Odea, MD  [facility] 08/23/2013, 12:26 PM

## 2013-08-23 NOTE — Progress Notes (Signed)
Hypoglycemic Event  CBG: 49  Treatment: 4 oz Orange Juice  Symptoms: None  Follow-up CBG: Time: 0820 CBG Result:103  Possible Reasons for Event: Medication regimen: glipizide  Comments/MD notified: Rizwan    Janoah Menna L Kreed Kauffman  Remember to initiate Hypoglycemia Order Set & complete

## 2013-08-26 ENCOUNTER — Ambulatory Visit (INDEPENDENT_AMBULATORY_CARE_PROVIDER_SITE_OTHER): Payer: Medicare Other | Admitting: *Deleted

## 2013-08-26 DIAGNOSIS — Z5181 Encounter for therapeutic drug level monitoring: Secondary | ICD-10-CM

## 2013-08-26 DIAGNOSIS — I6350 Cerebral infarction due to unspecified occlusion or stenosis of unspecified cerebral artery: Secondary | ICD-10-CM

## 2013-08-26 DIAGNOSIS — I82409 Acute embolism and thrombosis of unspecified deep veins of unspecified lower extremity: Secondary | ICD-10-CM

## 2013-08-26 DIAGNOSIS — I2699 Other pulmonary embolism without acute cor pulmonale: Secondary | ICD-10-CM

## 2013-08-26 DIAGNOSIS — I639 Cerebral infarction, unspecified: Secondary | ICD-10-CM

## 2013-08-26 LAB — POCT INR: INR: 1.4

## 2013-09-02 ENCOUNTER — Ambulatory Visit (INDEPENDENT_AMBULATORY_CARE_PROVIDER_SITE_OTHER): Payer: Medicare Other

## 2013-09-02 DIAGNOSIS — I82409 Acute embolism and thrombosis of unspecified deep veins of unspecified lower extremity: Secondary | ICD-10-CM

## 2013-09-02 DIAGNOSIS — Z5181 Encounter for therapeutic drug level monitoring: Secondary | ICD-10-CM

## 2013-09-02 DIAGNOSIS — I6350 Cerebral infarction due to unspecified occlusion or stenosis of unspecified cerebral artery: Secondary | ICD-10-CM

## 2013-09-02 DIAGNOSIS — I639 Cerebral infarction, unspecified: Secondary | ICD-10-CM

## 2013-09-02 DIAGNOSIS — I2699 Other pulmonary embolism without acute cor pulmonale: Secondary | ICD-10-CM

## 2013-09-02 LAB — POCT INR: INR: 2.2

## 2013-09-07 ENCOUNTER — Telehealth: Payer: Self-pay | Admitting: Pharmacist

## 2013-09-07 NOTE — Telephone Encounter (Signed)
LMOM to call back for rescheduling

## 2013-09-07 NOTE — Telephone Encounter (Signed)
Left message for patient to call back.  He is currently scheduled for colonoscopy by Dr. Michail Sermon on 09/28/13, and due to see coumadin clinic 09/23/13.  Patient will need to be set up for lovenox bridge.  Would prefer he come in 6/9 or 6/10 in case INR is supratherapeutic and needs more than 5 days to get INR low enough to proceed.  When patient calls back, need to set up PT/INR for either 6/9 or 6/10 with instruction to do lovenox bridge on that day as well.

## 2013-09-07 NOTE — Telephone Encounter (Signed)
Message copied by Bishop Limbo on Tue Sep 07, 2013 12:21 PM ------      Message from: Raul Del, PAMELA J      Created: Tue Sep 07, 2013  8:52 AM      Regarding: Bridging       Pt is going to be having a colonoscopy at [facility] (Dr Michail Sermon)  I don't think its been scheduling yet but Dr Percival Spanish did clear him.            Just FYI - Thanks!!            Pam ------

## 2013-09-08 NOTE — Telephone Encounter (Signed)
Patient states he hasn't used lovenox before so will need teaching.  He is to come in on 09/21/13 for lovenox teaching and INR.  Will need to be off coumadin 5 days prior to procedure.  Cleared for surgery by Dr. Percival Spanish already.

## 2013-09-09 ENCOUNTER — Encounter (HOSPITAL_COMMUNITY): Payer: Self-pay | Admitting: Pharmacy Technician

## 2013-09-09 ENCOUNTER — Telehealth: Payer: Self-pay | Admitting: Internal Medicine

## 2013-09-09 NOTE — Telephone Encounter (Signed)
Pt;s pharmacy  Is calling in to verify some scripts that were sent in; please call pharmacy at your earliest convenience [phone]

## 2013-09-21 ENCOUNTER — Ambulatory Visit (INDEPENDENT_AMBULATORY_CARE_PROVIDER_SITE_OTHER): Payer: Medicare Other | Admitting: Pharmacist Clinician (PhC)/ Clinical Pharmacy Specialist

## 2013-09-21 DIAGNOSIS — Z5181 Encounter for therapeutic drug level monitoring: Secondary | ICD-10-CM

## 2013-09-21 DIAGNOSIS — I6350 Cerebral infarction due to unspecified occlusion or stenosis of unspecified cerebral artery: Secondary | ICD-10-CM

## 2013-09-21 DIAGNOSIS — I2699 Other pulmonary embolism without acute cor pulmonale: Secondary | ICD-10-CM

## 2013-09-21 DIAGNOSIS — I639 Cerebral infarction, unspecified: Secondary | ICD-10-CM

## 2013-09-21 DIAGNOSIS — I82409 Acute embolism and thrombosis of unspecified deep veins of unspecified lower extremity: Secondary | ICD-10-CM

## 2013-09-21 LAB — POCT INR: INR: 3.3

## 2013-09-21 MED ORDER — ENOXAPARIN SODIUM 60 MG/0.6ML ~~LOC~~ SOLN: 60.0000 mg | Freq: Two times a day (BID) | SUBCUTANEOUS | Status: DC

## 2013-09-21 NOTE — Patient Instructions (Signed)
09-21-13 -    take 1/2 tablet warfarin (2.662m) 09-22-13 - take 1/2 tablet warfarin (2.569m 09-23-13 - no warfarin  09-24-13 - no warfarin, take lovenox injection at 8pm 09-25-13 - no warfarin, take lovenox injection at 8am and 8pm 09-26-13 - no warfarin, take lovenox injection at 8am and 8pm 09-27-13 - no warfarin, take lovenox injection at 8am ONLY 09-28-13 - restart warfarin in the evening - 1 tablet (62m43m6-17-15 - warfarin 1.5 tablets (7.62mg7mND lovenox injection at 8am and 8pm 09-30-13 - warfarin 1.5 tablets (7.62mg)57mD lovenox injection at 8am and 8pm 10-01-13 - warfarin 1 tablet (62mg) 94m lovenox injection at 8am and 8pm 10-02-13 - warfarin 1 tablet (62mg) A74mlovenox injection at 8am and 8pm 10-03-13 - warfarin 1 tablet (62mg) AN77movenox injection at 8am and 8pm 10-04-13 - recheck INR level

## 2013-09-22 ENCOUNTER — Encounter (HOSPITAL_COMMUNITY): Payer: Self-pay | Admitting: *Deleted

## 2013-09-28 ENCOUNTER — Ambulatory Visit (HOSPITAL_COMMUNITY): Admission: RE | Admit: 2013-09-28 | Payer: Medicare Other | Source: Ambulatory Visit | Admitting: Gastroenterology

## 2013-09-28 HISTORY — DX: Effusion, left ankle: M25.472

## 2013-09-28 HISTORY — DX: Effusion, left ankle: M25.471

## 2013-09-28 HISTORY — DX: Other amnesia: R41.3

## 2013-09-28 SURGERY — COLONOSCOPY WITH PROPOFOL
Anesthesia: Monitor Anesthesia Care

## 2013-10-13 ENCOUNTER — Ambulatory Visit (INDEPENDENT_AMBULATORY_CARE_PROVIDER_SITE_OTHER): Payer: Medicare Other | Admitting: *Deleted

## 2013-10-13 DIAGNOSIS — Z5181 Encounter for therapeutic drug level monitoring: Secondary | ICD-10-CM

## 2013-10-13 DIAGNOSIS — I639 Cerebral infarction, unspecified: Secondary | ICD-10-CM

## 2013-10-13 DIAGNOSIS — I82409 Acute embolism and thrombosis of unspecified deep veins of unspecified lower extremity: Secondary | ICD-10-CM

## 2013-10-13 DIAGNOSIS — I6350 Cerebral infarction due to unspecified occlusion or stenosis of unspecified cerebral artery: Secondary | ICD-10-CM

## 2013-10-13 DIAGNOSIS — I2699 Other pulmonary embolism without acute cor pulmonale: Secondary | ICD-10-CM

## 2013-10-13 LAB — POCT INR: INR: 1.3

## 2013-10-22 ENCOUNTER — Ambulatory Visit (INDEPENDENT_AMBULATORY_CARE_PROVIDER_SITE_OTHER): Payer: Medicare Other | Admitting: *Deleted

## 2013-10-22 DIAGNOSIS — I6350 Cerebral infarction due to unspecified occlusion or stenosis of unspecified cerebral artery: Secondary | ICD-10-CM

## 2013-10-22 DIAGNOSIS — I639 Cerebral infarction, unspecified: Secondary | ICD-10-CM

## 2013-10-22 DIAGNOSIS — Z5181 Encounter for therapeutic drug level monitoring: Secondary | ICD-10-CM

## 2013-10-22 DIAGNOSIS — I82409 Acute embolism and thrombosis of unspecified deep veins of unspecified lower extremity: Secondary | ICD-10-CM

## 2013-10-22 DIAGNOSIS — I2699 Other pulmonary embolism without acute cor pulmonale: Secondary | ICD-10-CM

## 2013-10-22 LAB — POCT INR: INR: 1.5

## 2013-11-05 ENCOUNTER — Ambulatory Visit (INDEPENDENT_AMBULATORY_CARE_PROVIDER_SITE_OTHER): Payer: Medicare Other

## 2013-11-05 DIAGNOSIS — Z5181 Encounter for therapeutic drug level monitoring: Secondary | ICD-10-CM

## 2013-11-05 DIAGNOSIS — I6350 Cerebral infarction due to unspecified occlusion or stenosis of unspecified cerebral artery: Secondary | ICD-10-CM

## 2013-11-05 DIAGNOSIS — I2699 Other pulmonary embolism without acute cor pulmonale: Secondary | ICD-10-CM

## 2013-11-05 DIAGNOSIS — I639 Cerebral infarction, unspecified: Secondary | ICD-10-CM

## 2013-11-05 DIAGNOSIS — I82409 Acute embolism and thrombosis of unspecified deep veins of unspecified lower extremity: Secondary | ICD-10-CM

## 2013-11-05 LAB — POCT INR: INR: 2.6

## 2013-11-19 ENCOUNTER — Encounter (HOSPITAL_COMMUNITY): Payer: Self-pay | Admitting: Emergency Medicine

## 2013-11-19 ENCOUNTER — Inpatient Hospital Stay (HOSPITAL_COMMUNITY)
Admission: EM | Admit: 2013-11-19 | Discharge: 2013-11-23 | DRG: 176 | Disposition: A | Discharge status: Home / Self Care | Payer: Medicare Other | Attending: Internal Medicine | Admitting: Internal Medicine

## 2013-11-19 DIAGNOSIS — Z8673 Personal history of transient ischemic attack (TIA), and cerebral infarction without residual deficits: Secondary | ICD-10-CM

## 2013-11-19 DIAGNOSIS — I428 Other cardiomyopathies: Secondary | ICD-10-CM | POA: Diagnosis present

## 2013-11-19 DIAGNOSIS — N390 Urinary tract infection, site not specified: Secondary | ICD-10-CM

## 2013-11-19 DIAGNOSIS — D72829 Elevated white blood cell count, unspecified: Secondary | ICD-10-CM | POA: Diagnosis present

## 2013-11-19 DIAGNOSIS — I2699 Other pulmonary embolism without acute cor pulmonale: Secondary | ICD-10-CM | POA: Diagnosis not present

## 2013-11-19 DIAGNOSIS — R9431 Abnormal electrocardiogram [ECG] [EKG]: Secondary | ICD-10-CM

## 2013-11-19 DIAGNOSIS — D638 Anemia in other chronic diseases classified elsewhere: Secondary | ICD-10-CM | POA: Diagnosis not present

## 2013-11-19 DIAGNOSIS — D649 Anemia, unspecified: Secondary | ICD-10-CM

## 2013-11-19 DIAGNOSIS — Z79899 Other long term (current) drug therapy: Secondary | ICD-10-CM

## 2013-11-19 DIAGNOSIS — E785 Hyperlipidemia, unspecified: Secondary | ICD-10-CM | POA: Diagnosis present

## 2013-11-19 DIAGNOSIS — Q211 Atrial septal defect: Secondary | ICD-10-CM

## 2013-11-19 DIAGNOSIS — J189 Pneumonia, unspecified organism: Secondary | ICD-10-CM | POA: Diagnosis present

## 2013-11-19 DIAGNOSIS — I509 Heart failure, unspecified: Secondary | ICD-10-CM | POA: Diagnosis present

## 2013-11-19 DIAGNOSIS — I4729 Other ventricular tachycardia: Secondary | ICD-10-CM | POA: Diagnosis not present

## 2013-11-19 DIAGNOSIS — K5090 Crohn's disease, unspecified, without complications: Secondary | ICD-10-CM | POA: Diagnosis present

## 2013-11-19 DIAGNOSIS — Z86711 Personal history of pulmonary embolism: Secondary | ICD-10-CM

## 2013-11-19 DIAGNOSIS — M129 Arthropathy, unspecified: Secondary | ICD-10-CM | POA: Diagnosis present

## 2013-11-19 DIAGNOSIS — Q2111 Secundum atrial septal defect: Secondary | ICD-10-CM

## 2013-11-19 DIAGNOSIS — Z7901 Long term (current) use of anticoagulants: Secondary | ICD-10-CM

## 2013-11-19 DIAGNOSIS — R791 Abnormal coagulation profile: Secondary | ICD-10-CM | POA: Diagnosis not present

## 2013-11-19 DIAGNOSIS — I10 Essential (primary) hypertension: Secondary | ICD-10-CM

## 2013-11-19 DIAGNOSIS — I472 Ventricular tachycardia, unspecified: Secondary | ICD-10-CM | POA: Diagnosis not present

## 2013-11-19 DIAGNOSIS — E119 Type 2 diabetes mellitus without complications: Secondary | ICD-10-CM

## 2013-11-19 DIAGNOSIS — I110 Hypertensive heart disease with heart failure: Secondary | ICD-10-CM | POA: Diagnosis present

## 2013-11-19 DIAGNOSIS — I82509 Chronic embolism and thrombosis of unspecified deep veins of unspecified lower extremity: Secondary | ICD-10-CM | POA: Diagnosis present

## 2013-11-19 DIAGNOSIS — Z86718 Personal history of other venous thrombosis and embolism: Secondary | ICD-10-CM

## 2013-11-19 DIAGNOSIS — I2510 Atherosclerotic heart disease of native coronary artery without angina pectoris: Secondary | ICD-10-CM | POA: Diagnosis present

## 2013-11-19 DIAGNOSIS — I5022 Chronic systolic (congestive) heart failure: Secondary | ICD-10-CM

## 2013-11-19 DIAGNOSIS — F17200 Nicotine dependence, unspecified, uncomplicated: Secondary | ICD-10-CM

## 2013-11-19 LAB — COMPREHENSIVE METABOLIC PANEL
ALK PHOS: 84 U/L (ref 39–117)
ALT: 6 U/L (ref 0–53)
AST: 9 U/L (ref 0–37)
Albumin: 2.7 g/dL — ABNORMAL LOW (ref 3.5–5.2)
Anion gap: 14 (ref 5–15)
BUN: 12 mg/dL (ref 6–23)
CO2: 24 meq/L (ref 19–32)
Calcium: 9 mg/dL (ref 8.4–10.5)
Chloride: 100 mEq/L (ref 96–112)
Creatinine, Ser: 1.17 mg/dL (ref 0.50–1.35)
GFR calc Af Amer: 69 mL/min — ABNORMAL LOW (ref >90)
GFR, EST NON AFRICAN AMERICAN: 59 mL/min — AB (ref >90)
Glucose, Bld: 196 mg/dL — ABNORMAL HIGH (ref 70–99)
Potassium: 4.1 mEq/L (ref 3.7–5.3)
SODIUM: 138 meq/L (ref 137–147)
Total Bilirubin: 1.7 mg/dL — ABNORMAL HIGH (ref 0.3–1.2)
Total Protein: 7 g/dL (ref 6.0–8.3)

## 2013-11-19 LAB — URINALYSIS, ROUTINE W REFLEX MICROSCOPIC
GLUCOSE, UA: 100 mg/dL — AB
Ketones, ur: 15 mg/dL — AB
Nitrite: POSITIVE — AB
PH: 6 (ref 5.0–8.0)
Protein, ur: 100 mg/dL — AB
SPECIFIC GRAVITY, URINE: 1.026 (ref 1.005–1.030)
Urobilinogen, UA: 4 mg/dL — ABNORMAL HIGH (ref 0.0–1.0)

## 2013-11-19 LAB — CBC WITH DIFFERENTIAL/PLATELET
BASOS ABS: 0 10*3/uL (ref 0.0–0.1)
Basophils Relative: 0 % (ref 0–1)
Eosinophils Absolute: 0 10*3/uL (ref 0.0–0.7)
Eosinophils Relative: 0 % (ref 0–5)
HEMATOCRIT: 39.8 % (ref 39.0–52.0)
Hemoglobin: 13.5 g/dL (ref 13.0–17.0)
LYMPHS ABS: 0.5 10*3/uL — AB (ref 0.7–4.0)
LYMPHS PCT: 4 % — AB (ref 12–46)
MCH: 31.6 pg (ref 26.0–34.0)
MCHC: 33.9 g/dL (ref 30.0–36.0)
MCV: 93.2 fL (ref 78.0–100.0)
Monocytes Absolute: 1.1 10*3/uL — ABNORMAL HIGH (ref 0.1–1.0)
Monocytes Relative: 9 % (ref 3–12)
Neutro Abs: 10.6 10*3/uL — ABNORMAL HIGH (ref 1.7–7.7)
Neutrophils Relative %: 87 % — ABNORMAL HIGH (ref 43–77)
PLATELETS: 173 10*3/uL (ref 150–400)
RBC: 4.27 MIL/uL (ref 4.22–5.81)
RDW: 13.1 % (ref 11.5–15.5)
WBC: 12.2 10*3/uL — AB (ref 4.0–10.5)

## 2013-11-19 LAB — URINE MICROSCOPIC-ADD ON

## 2013-11-19 LAB — I-STAT TROPONIN, ED: Troponin i, poc: 0.03 ng/mL (ref 0.00–0.08)

## 2013-11-19 MED ORDER — MORPHINE SULFATE 4 MG/ML IJ SOLN
4.0000 mg | Freq: Once | INTRAMUSCULAR | Status: AC
  Administered 2013-11-20: 4 mg via INTRAVENOUS
  Filled 2013-11-19: qty 1

## 2013-11-19 MED ORDER — HYDROCODONE-ACETAMINOPHEN 5-325 MG PO TABS: 1.0000 | ORAL_TABLET | Freq: Once | ORAL | Status: DC

## 2013-11-19 MED ORDER — DEXTROSE 5 % IV SOLN
1.0000 g | Freq: Once | INTRAVENOUS | Status: AC
  Administered 2013-11-20: 1 g via INTRAVENOUS
  Filled 2013-11-19: qty 10

## 2013-11-19 MED ORDER — ONDANSETRON HCL 4 MG/2ML IJ SOLN
4.0000 mg | Freq: Once | INTRAMUSCULAR | Status: AC
  Administered 2013-11-20: 4 mg via INTRAVENOUS
  Filled 2013-11-19: qty 2

## 2013-11-19 NOTE — ED Notes (Signed)
Family at bedside. 

## 2013-11-19 NOTE — ED Notes (Signed)
Pt reports pain in R lower ribs/flank area and heaviness in L arm for 2 days. Denies sob, nv, urinary sx. Pt RLQ abdomen tender to palpation during ekg. No injury noted. Pt reports he coughed up blood x2 and pain increases with deep breathing.

## 2013-11-19 NOTE — ED Provider Notes (Signed)
CSN: [id number]     Arrival date & time 11/19/13  2027 History   First MD Initiated Contact with Patient 11/19/13 2252     Chief Complaint  Patient presents with  . Flank Pain     (Consider location/radiation/quality/duration/timing/severity/associated sxs/prior Treatment) HPI  This is a 73 year old male with history of hypertension, diabetes, coronary artery disease, dilated cardiomyopathy, PE who presents with right flank pain. Patient reports 2-3 day history sharp right flank pain. It is worse with movement and breathing. Denies any shortness of breath chest pain. Does endorse a productive cough. Denies any fevers.  Currently rates his pain at 7/10. He took Tylenol with minimal relief. Denies any urinary symptoms or hematuria. Denies similar pain like this in the past.  Past Medical History  Diagnosis Date  . Hypertension   . Dilated cardiomyopathy     2/12: EF 30-35%, trivial AI, mild RAE.  EF 2014 40 -45%  . CAD (coronary artery disease)     LHC 9/05 with Dr. Einar Gip:  dLM 20-30%, LAD 85%, oD1 20-30%.  PCI:  Taxus DES to LAD; Dx jailed and tx with POBA.  Last myoview 12/10: inf scar, no ischemia, EF 29%.  . DVT (deep venous thrombosis)   . Pulmonary embolus     chronic coumadin  . Hyperhomocystinemia   . PFO (patent foramen ovale)     Not mentioned on 2014 echo.  [name] HLD (hyperlipidemia)   . Crohn's disease   . Nephrolithiasis   . Diabetes mellitus     11/05/11 "borderline; don't take medications"  . Stroke 1993    "left arm can't hold steady; leg too"  . Arthritis     "used to have a touch in my legs"  . Memory difficulties   . Pneumonia ~ 2011    09-22-13 denies any recent SOB or breathing problems  . Swelling of both ankles      09-22-13 occ.feet, but denies pain.   Past Surgical History  Procedure Laterality Date  . Colon surgery  1994; 1996    "for Crohn's disease"  . Appendectomy     Family History  Problem Relation Age of [place]  . Diabetes type II Mother   . CAD  Father   . Diabetes type II Brother    History  Substance Use Topics  . Smoking status: Current Every Day Smoker -- 0.50 packs/day for 53 years    Types: Cigarettes  . Smokeless tobacco: Never Used  . Alcohol Use: No    Review of Systems  Constitutional: Negative.  Negative for fever.  Respiratory: Positive for cough and shortness of breath. Negative for chest tightness.   Cardiovascular: Positive for chest pain. Negative for leg swelling.  Gastrointestinal: Negative.  Negative for nausea, vomiting, abdominal pain and diarrhea.  Genitourinary: Positive for flank pain. Negative for dysuria and hematuria.  Musculoskeletal: Negative for back pain.  Skin: Negative for rash.  Neurological: Negative for headaches.  All other systems reviewed and are negative.     Allergies  Review of patient's allergies indicates no known allergies.  Home Medications   Prior to Admission medications   Medication Sig Start Date End Date Taking? Authorizing Provider  acetaminophen (TYLENOL) 500 MG tablet Take 1,000 mg by mouth every 6 (six) hours as needed (Pain).   Yes Historical Provider, MD  carvedilol (COREG) 3.125 MG tablet Take 3.125 mg by mouth daily.   Yes Historical Provider, MD  donepezil (ARICEPT) 5 MG tablet Take 5 mg by mouth at bedtime.  Yes Historical Provider, MD  glipiZIDE (GLUCOTROL) 5 MG tablet Take 1 tablet (5 mg total) by mouth daily before breakfast. 08/23/13  Yes Debbe Odea, MD  latanoprost (XALATAN) 0.005 % ophthalmic solution Place 1 drop into both eyes at bedtime. 06/29/13  Yes Historical Provider, MD  lisinopril (PRINIVIL,ZESTRIL) 5 MG tablet Take 2.5 mg by mouth every morning.   Yes Historical Provider, MD  warfarin (COUMADIN) 5 MG tablet Take 5 mg by mouth daily. Patient not sure of time he took medication yesterday   Yes Historical Provider, MD  carvedilol (COREG) 3.125 MG tablet Take 1 tablet (3.125 mg total) by mouth 2 (two) times daily. 10/20/12 10/20/13  Minus Breeding,  MD  glucose monitoring kit (FREESTYLE) monitoring kit 1 each by Does not apply route as needed for other. Please give 100 lancets and testing strips.  Check sugars every morning before breakfast and every evening before dinner 08/23/13   Debbe Odea, MD   BP 116/71  Pulse 74  Temp(Src) 99.9 F (37.7 C) (Oral)  Resp 16  Ht _0  (1.753 m)  Wt 130 lb 3.2 oz (59.058 kg)  BMI 19.22 kg/m2  SpO2 95% Physical Exam  Nursing note and vitals reviewed. Constitutional: He is oriented to person, place, and time. No distress.  Elderly  HENT:  Head: Normocephalic and atraumatic.  Mouth/Throat: Oropharynx is clear and moist.  Cardiovascular: Normal rate, regular rhythm and normal heart sounds.   No murmur heard. Pulmonary/Chest: Effort normal and breath sounds normal. No respiratory distress. He has no wheezes.  Coarse breath sounds bilaterally  Abdominal: Soft. Bowel sounds are normal. There is tenderness. There is no rebound.  Tenderness palpation over the right flank and right upper quadrant  Musculoskeletal: He exhibits no edema.  Lymphadenopathy:    He has no cervical adenopathy.  Neurological: He is alert and oriented to person, place, and time.  Skin: Skin is warm and dry.  Psychiatric: He has a normal mood and affect.    ED Course  Procedures (including critical care time) Labs Review Labs Reviewed  CBC WITH DIFFERENTIAL - Abnormal; Notable for the following:    WBC 12.2 (*)    Neutrophils Relative % 87 (*)    Neutro Abs 10.6 (*)    Lymphocytes Relative 4 (*)    Lymphs Abs 0.5 (*)    Monocytes Absolute 1.1 (*)    All other components within normal limits  COMPREHENSIVE METABOLIC PANEL - Abnormal; Notable for the following:    Glucose, Bld 196 (*)    Albumin 2.7 (*)    Total Bilirubin 1.7 (*)    GFR calc non Af Amer 59 (*)    GFR calc Af Amer 69 (*)    All other components within normal limits  URINALYSIS, ROUTINE W REFLEX MICROSCOPIC - Abnormal; Notable for the following:     Color, Urine AMBER (*)    APPearance HAZY (*)    Glucose, UA 100 (*)    Hgb urine dipstick TRACE (*)    Bilirubin Urine MODERATE (*)    Ketones, ur 15 (*)    Protein, ur 100 (*)    Urobilinogen, UA 4.0 (*)    Nitrite POSITIVE (*)    Leukocytes, UA SMALL (*)    All other components within normal limits  URINE MICROSCOPIC-ADD ON - Abnormal; Notable for the following:    Squamous Epithelial / LPF FEW (*)    Casts HYALINE CASTS (*)    All other components within normal limits  PROTIME-INR -  Abnormal; Notable for the following:    Prothrombin Time 20.1 (*)    INR 1.71 (*)    All other components within normal limits  PROTIME-INR - Abnormal; Notable for the following:    Prothrombin Time 20.7 (*)    INR 1.78 (*)    All other components within normal limits  GLUCOSE, CAPILLARY - Abnormal; Notable for the following:    Glucose-Capillary 169 (*)    All other components within normal limits  URINE CULTURE  CULTURE, BLOOD (ROUTINE X 2)  CULTURE, BLOOD (ROUTINE X 2)  CULTURE, EXPECTORATED SPUTUM-ASSESSMENT  GRAM STAIN  HIV ANTIBODY (ROUTINE TESTING)  LEGIONELLA ANTIGEN, URINE  STREP PNEUMONIAE URINARY ANTIGEN  I-STAT TROPOININ, ED  I-STAT CG4 LACTIC ACID, ED    Imaging Review Dg Chest 2 View  11/20/2013   CLINICAL DATA:  Right flank pain  EXAM: CHEST  2 VIEW  COMPARISON:  CT scan 11/05/2011 and chest radiograph 11/04/2011  FINDINGS: There ismild cardiac enlargement. Vascular pattern is normal. Left lung is clear. On the right, there is consolidation in the superior segment of the left lower lobe. There is no pneumothorax. There is no significant pleural effusion.  IMPRESSION: Consolidation in the superior segment of the right lower lobe consistent with pneumonia. Radiographic followup after appropriate therapy is recommended to ensure resolution.   Electronically Signed   By: Skipper Cliche M.D.   On: 11/20/2013 01:04     EKG Interpretation   Date/Time:  Friday November 19 2013  20:32:21 EDT Ventricular Rate:  86 PR Interval:  124 QRS Duration: 92 QT Interval:  354 QTC Calculation: 423 R Axis:   57 Text Interpretation:  Sinus rhythm Left ventricular hypertrophy Early  repolarization T wave inversions laterally, new when compared to prior  Confirmed by HORTON  MD, [place] ([id number]) on 11/20/2013 12:07:55 AM      MDM   Final diagnoses:  Community acquired pneumonia  Urinary tract infection without hematuria, site unspecified    Patient presents with right-sided flank and chest wall pain. He is nontoxic on exam. Afebrile. He does report productive cough. Breath sounds coarse. Lab notable for nitrite-positive urine as well as right lower lobe pneumonia which is likely the cause of the patient's pleuritic pain. He is therapeutic on his Coumadin.  Cultures were sent. Patient was given Rocephin and azithromycin for community-acquired pneumonia. Given this urinary tract infection and community-acquired pneumonia, will admit for further management.    Merryl Hacker, MD 11/20/13 [phone]

## 2013-11-20 ENCOUNTER — Inpatient Hospital Stay (HOSPITAL_COMMUNITY): Payer: Medicare Other

## 2013-11-20 ENCOUNTER — Emergency Department (HOSPITAL_COMMUNITY): Payer: Medicare Other

## 2013-11-20 DIAGNOSIS — M129 Arthropathy, unspecified: Secondary | ICD-10-CM | POA: Diagnosis present

## 2013-11-20 DIAGNOSIS — Z79899 Other long term (current) drug therapy: Secondary | ICD-10-CM | POA: Diagnosis not present

## 2013-11-20 DIAGNOSIS — K5090 Crohn's disease, unspecified, without complications: Secondary | ICD-10-CM | POA: Diagnosis present

## 2013-11-20 DIAGNOSIS — I5022 Chronic systolic (congestive) heart failure: Secondary | ICD-10-CM | POA: Diagnosis present

## 2013-11-20 DIAGNOSIS — I4729 Other ventricular tachycardia: Secondary | ICD-10-CM | POA: Diagnosis not present

## 2013-11-20 DIAGNOSIS — Z86718 Personal history of other venous thrombosis and embolism: Secondary | ICD-10-CM | POA: Diagnosis not present

## 2013-11-20 DIAGNOSIS — J189 Pneumonia, unspecified organism: Secondary | ICD-10-CM | POA: Diagnosis present

## 2013-11-20 DIAGNOSIS — D72829 Elevated white blood cell count, unspecified: Secondary | ICD-10-CM | POA: Diagnosis present

## 2013-11-20 DIAGNOSIS — Z8673 Personal history of transient ischemic attack (TIA), and cerebral infarction without residual deficits: Secondary | ICD-10-CM | POA: Diagnosis not present

## 2013-11-20 DIAGNOSIS — Z86711 Personal history of pulmonary embolism: Secondary | ICD-10-CM | POA: Diagnosis present

## 2013-11-20 DIAGNOSIS — Q2111 Secundum atrial septal defect: Secondary | ICD-10-CM | POA: Diagnosis not present

## 2013-11-20 DIAGNOSIS — Q211 Atrial septal defect: Secondary | ICD-10-CM | POA: Diagnosis not present

## 2013-11-20 DIAGNOSIS — I2699 Other pulmonary embolism without acute cor pulmonale: Secondary | ICD-10-CM | POA: Diagnosis present

## 2013-11-20 DIAGNOSIS — R791 Abnormal coagulation profile: Secondary | ICD-10-CM | POA: Diagnosis not present

## 2013-11-20 DIAGNOSIS — E119 Type 2 diabetes mellitus without complications: Secondary | ICD-10-CM | POA: Diagnosis present

## 2013-11-20 DIAGNOSIS — I428 Other cardiomyopathies: Secondary | ICD-10-CM | POA: Diagnosis present

## 2013-11-20 DIAGNOSIS — I472 Ventricular tachycardia: Secondary | ICD-10-CM | POA: Diagnosis not present

## 2013-11-20 DIAGNOSIS — I2510 Atherosclerotic heart disease of native coronary artery without angina pectoris: Secondary | ICD-10-CM | POA: Diagnosis present

## 2013-11-20 DIAGNOSIS — I82509 Chronic embolism and thrombosis of unspecified deep veins of unspecified lower extremity: Secondary | ICD-10-CM | POA: Diagnosis present

## 2013-11-20 DIAGNOSIS — F17200 Nicotine dependence, unspecified, uncomplicated: Secondary | ICD-10-CM | POA: Diagnosis present

## 2013-11-20 DIAGNOSIS — Z7901 Long term (current) use of anticoagulants: Secondary | ICD-10-CM | POA: Diagnosis not present

## 2013-11-20 DIAGNOSIS — I509 Heart failure, unspecified: Secondary | ICD-10-CM | POA: Diagnosis present

## 2013-11-20 DIAGNOSIS — D649 Anemia, unspecified: Secondary | ICD-10-CM | POA: Diagnosis present

## 2013-11-20 DIAGNOSIS — N390 Urinary tract infection, site not specified: Secondary | ICD-10-CM | POA: Diagnosis present

## 2013-11-20 DIAGNOSIS — E785 Hyperlipidemia, unspecified: Secondary | ICD-10-CM | POA: Diagnosis present

## 2013-11-20 DIAGNOSIS — I10 Essential (primary) hypertension: Secondary | ICD-10-CM | POA: Diagnosis present

## 2013-11-20 LAB — PROTIME-INR
INR: 1.71 — ABNORMAL HIGH (ref 0.00–1.49)
INR: 1.78 — AB (ref 0.00–1.49)
PROTHROMBIN TIME: 20.1 s — AB (ref 11.6–15.2)
Prothrombin Time: 20.7 seconds — ABNORMAL HIGH (ref 11.6–15.2)

## 2013-11-20 LAB — GLUCOSE, CAPILLARY
GLUCOSE-CAPILLARY: 86 mg/dL (ref 70–99)
GLUCOSE-CAPILLARY: 148 mg/dL — AB (ref 70–99)
Glucose-Capillary: 169 mg/dL — ABNORMAL HIGH (ref 70–99)
Glucose-Capillary: 177 mg/dL — ABNORMAL HIGH (ref 70–99)
Glucose-Capillary: 75 mg/dL (ref 70–99)

## 2013-11-20 LAB — HIV ANTIBODY (ROUTINE TESTING W REFLEX): HIV: NONREACTIVE

## 2013-11-20 LAB — D-DIMER, QUANTITATIVE: D-Dimer, Quant: 5.75 ug/mL-FEU — ABNORMAL HIGH (ref 0.00–0.48)

## 2013-11-20 MED ORDER — LATANOPROST 0.005 % OP SOLN
1.0000 [drp] | Freq: Every day | OPHTHALMIC | Status: DC
  Administered 2013-11-20 – 2013-11-22 (×3): 1 [drp] via OPHTHALMIC
  Filled 2013-11-20: qty 2.5

## 2013-11-20 MED ORDER — INSULIN ASPART 100 UNIT/ML ~~LOC~~ SOLN
0.0000 [IU] | Freq: Three times a day (TID) | SUBCUTANEOUS | Status: DC
  Administered 2013-11-20 – 2013-11-21 (×2): 3 [IU] via SUBCUTANEOUS
  Administered 2013-11-23: 2 [IU] via SUBCUTANEOUS

## 2013-11-20 MED ORDER — SODIUM CHLORIDE 0.9 % IV SOLN
INTRAVENOUS | Status: DC
  Administered 2013-11-20: 03:00:00 via INTRAVENOUS

## 2013-11-20 MED ORDER — DEXTROSE 5 % IV SOLN
500.0000 mg | Freq: Once | INTRAVENOUS | Status: AC
  Administered 2013-11-20: 500 mg via INTRAVENOUS
  Filled 2013-11-20: qty 500

## 2013-11-20 MED ORDER — ENOXAPARIN SODIUM 60 MG/0.6ML ~~LOC~~ SOLN
1.0000 mg/kg | Freq: Two times a day (BID) | SUBCUTANEOUS | Status: DC
  Administered 2013-11-20 – 2013-11-21 (×2): 60 mg via SUBCUTANEOUS
  Filled 2013-11-20 (×4): qty 0.6

## 2013-11-20 MED ORDER — IOHEXOL 350 MG/ML SOLN
100.0000 mL | Freq: Once | INTRAVENOUS | Status: AC | PRN
  Administered 2013-11-20: 100 mL via INTRAVENOUS

## 2013-11-20 MED ORDER — WARFARIN - PHARMACIST DOSING INPATIENT: Freq: Every day | Status: DC

## 2013-11-20 MED ORDER — CETYLPYRIDINIUM CHLORIDE 0.05 % MT LIQD
7.0000 mL | Freq: Two times a day (BID) | OROMUCOSAL | Status: DC
  Administered 2013-11-20 – 2013-11-23 (×7): 7 mL via OROMUCOSAL

## 2013-11-20 MED ORDER — CARVEDILOL 3.125 MG PO TABS
3.1250 mg | ORAL_TABLET | Freq: Every day | ORAL | Status: DC
  Administered 2013-11-20 – 2013-11-23 (×4): 3.125 mg via ORAL
  Filled 2013-11-20 (×4): qty 1

## 2013-11-20 MED ORDER — HYDROCODONE-ACETAMINOPHEN 5-325 MG PO TABS: 1.0000 | ORAL_TABLET | Freq: Four times a day (QID) | ORAL | Status: DC | PRN

## 2013-11-20 MED ORDER — WARFARIN SODIUM 7.5 MG PO TABS
7.5000 mg | ORAL_TABLET | Freq: Once | ORAL | Status: AC
  Administered 2013-11-20: 7.5 mg via ORAL
  Filled 2013-11-20: qty 1

## 2013-11-20 MED ORDER — ACETAMINOPHEN 325 MG PO TABS
650.0000 mg | ORAL_TABLET | Freq: Four times a day (QID) | ORAL | Status: DC | PRN
  Administered 2013-11-20 – 2013-11-21 (×2): 650 mg via ORAL
  Filled 2013-11-20 (×2): qty 2

## 2013-11-20 MED ORDER — WARFARIN SODIUM 7.5 MG PO TABS
7.5000 mg | ORAL_TABLET | Freq: Once | ORAL | Status: AC
  Administered 2013-11-20: 7.5 mg via ORAL
  Filled 2013-11-20 (×2): qty 1

## 2013-11-20 MED ORDER — DEXTROSE 5 % IV SOLN
500.0000 mg | INTRAVENOUS | Status: DC
  Administered 2013-11-21: 500 mg via INTRAVENOUS
  Filled 2013-11-20: qty 500

## 2013-11-20 MED ORDER — MORPHINE SULFATE 2 MG/ML IJ SOLN: 2.0000 mg | INTRAMUSCULAR | Status: DC | PRN

## 2013-11-20 MED ORDER — DONEPEZIL HCL 5 MG PO TABS
5.0000 mg | ORAL_TABLET | Freq: Every day | ORAL | Status: DC
  Administered 2013-11-20 – 2013-11-22 (×3): 5 mg via ORAL
  Filled 2013-11-20 (×4): qty 1

## 2013-11-20 MED ORDER — ACETAMINOPHEN 500 MG PO TABS: 1000.0000 mg | ORAL_TABLET | Freq: Four times a day (QID) | ORAL | Status: DC | PRN

## 2013-11-20 MED ORDER — DEXTROSE 5 % IV SOLN
1.0000 g | INTRAVENOUS | Status: DC
  Administered 2013-11-21: 1 g via INTRAVENOUS
  Filled 2013-11-20: qty 10

## 2013-11-20 MED ORDER — LISINOPRIL 2.5 MG PO TABS
2.5000 mg | ORAL_TABLET | Freq: Every morning | ORAL | Status: DC
  Administered 2013-11-20 – 2013-11-23 (×4): 2.5 mg via ORAL
  Filled 2013-11-20 (×4): qty 1

## 2013-11-20 NOTE — Progress Notes (Signed)
ANTICOAGULATION CONSULT NOTE - Follow Up Consult  Pharmacy Consult for Warfarin  Indication: hx PE  No Known Allergies  Patient Measurements: Height: 5' 9"  (175.3 cm) Weight: 130 lb 3.2 oz (59.058 kg) IBW/kg (Calculated) : 70.7  Vital Signs: Temp: 99.7 F (37.6 C) (08/08 1806) Temp src: Oral (08/08 1806) BP: 143/78 mmHg (08/08 1806) Pulse Rate: 68 (08/08 1806)  Labs:  Recent Labs  11/19/13 2039 11/19/13 2354 11/20/13 0450  HGB 13.5  --   --   HCT 39.8  --   --   PLT 173  --   --   LABPROT  --  20.1* 20.7*  INR  --  1.71* 1.78*  CREATININE 1.17  --   --    Estimated Creatinine Clearance: 45.6 ml/min (by C-G formula based on Cr of 1.17). Medical History: Past Medical History  Diagnosis Date  . Hypertension   . Dilated cardiomyopathy     2/12: EF 30-35%, trivial AI, mild RAE.  EF 2014 40 -45%  . CAD (coronary artery disease)     LHC 9/05 with Dr. Einar Gip:  dLM 20-30%, LAD 85%, oD1 20-30%.  PCI:  Taxus DES to LAD; Dx jailed and tx with POBA.  Last myoview 12/10: inf scar, no ischemia, EF 29%.  . DVT (deep venous thrombosis)   . Pulmonary embolus     chronic coumadin  . Hyperhomocystinemia   . PFO (patent foramen ovale)     Not mentioned on 2014 echo.  [name] HLD (hyperlipidemia)   . Crohn's disease   . Nephrolithiasis   . Diabetes mellitus     11/05/11 "borderline; don't take medications"  . Stroke 1993    "left arm can't hold steady; leg too"  . Arthritis     "used to have a touch in my legs"  . Memory difficulties   . Pneumonia ~ 2011    09-22-13 denies any recent SOB or breathing problems  . Swelling of both ankles      09-22-13 occ.feet, but denies pain.    Medications:  Warfarin 70m/day PTA  Assessment: 73y/o M with hx PE here with flank pain, INR 1.78, CXR consistent with PNA, other labs as above. Per RN, no s/s of bleeding. Hgb is good at 13.5.   Goal of Therapy:  INR 2-3 Monitor platelets by anticoagulation protocol: Yes   Plan:  -Repeat Coumadin  7.5 mg PO x 1 now -Daily PT/INR -Monitor for bleeding  BAlbertina Parr PharmD.  Clinical Pharmacist Pager [phone] Addendum: Pharmacy consulted to add Lovenox bridge to Coumadin therapy.   Plan: 1) Start Lovenox 60 mg (1 mg/kg) [place] Q 12 hours  2) Monitor Q72h CBC 3) D/C Lovenox when INR > 2   BAlbertina Parr PharmD.  Clinical Pharmacist Pager [phone]

## 2013-11-20 NOTE — ED Notes (Signed)
Transporting patient to new room assignment. 

## 2013-11-20 NOTE — Progress Notes (Signed)
ANTICOAGULATION CONSULT NOTE - Initial Consult  Pharmacy Consult for Warfarin  Indication: hx PE  No Known Allergies  Patient Measurements: Height: 5' 9"  (175.3 cm) Weight: 158 lb (71.668 kg) IBW/kg (Calculated) : 70.7  Vital Signs: Temp: 99 F (37.2 C) (08/07 2038) Temp src: Oral (08/07 2038) BP: 133/69 mmHg (08/08 0045) Pulse Rate: 76 (08/08 0045)  Labs:  Recent Labs  11/19/13 2039 11/19/13 2354  HGB 13.5  --   HCT 39.8  --   PLT 173  --   LABPROT  --  20.1*  INR  --  1.71*  CREATININE 1.17  --    Estimated Creatinine Clearance: 54.6 ml/min (by C-G formula based on Cr of 1.17). Medical History: Past Medical History  Diagnosis Date  . Hypertension   . Dilated cardiomyopathy     2/12: EF 30-35%, trivial AI, mild RAE.  EF 2014 40 -45%  . CAD (coronary artery disease)     LHC 9/05 with Dr. Einar Gip:  dLM 20-30%, LAD 85%, oD1 20-30%.  PCI:  Taxus DES to LAD; Dx jailed and tx with POBA.  Last myoview 12/10: inf scar, no ischemia, EF 29%.  . DVT (deep venous thrombosis)   . Pulmonary embolus     chronic coumadin  . Hyperhomocystinemia   . PFO (patent foramen ovale)     Not mentioned on 2014 echo.  [name] HLD (hyperlipidemia)   . Crohn's disease   . Nephrolithiasis   . Diabetes mellitus     11/05/11 "borderline; don't take medications"  . Stroke 1993    "left arm can't hold steady; leg too"  . Arthritis     "used to have a touch in my legs"  . Memory difficulties   . Pneumonia ~ 2011    09-22-13 denies any recent SOB or breathing problems  . Swelling of both ankles      09-22-13 occ.feet, but denies pain.    Medications:  Warfarin 1m/day PTA  Assessment: 73y/o M with hx PE here with flank pain, INR 1.71, last dose 8/6, CXR consistent with PNA, other labs as above. Noted pt reported some coughing up of blood, Hgb is good at 13.5.   Goal of Therapy:  INR 2-3 Monitor platelets by anticoagulation protocol: Yes   Plan:  -Warfarin 7.5 mg PO x 1 now -Daily  PT/INR -Monitor for bleeding  LNarda Bonds8/11/2013,1:39 AM

## 2013-11-20 NOTE — ED Notes (Signed)
Report called to unit.

## 2013-11-20 NOTE — Progress Notes (Signed)
ANTICOAGULATION CONSULT NOTE - Follow Up Consult  Pharmacy Consult for Warfarin  Indication: hx PE  No Known Allergies  Patient Measurements: Height: 5' 9"  (175.3 cm) Weight: 130 lb 3.2 oz (59.058 kg) IBW/kg (Calculated) : 70.7  Vital Signs: Temp: 99 F (37.2 C) (08/08 1000) Temp src: Oral (08/08 1000) BP: 128/68 mmHg (08/08 1000) Pulse Rate: 77 (08/08 1000)  Labs:  Recent Labs  11/19/13 2039 11/19/13 2354 11/20/13 0450  HGB 13.5  --   --   HCT 39.8  --   --   PLT 173  --   --   LABPROT  --  20.1* 20.7*  INR  --  1.71* 1.78*  CREATININE 1.17  --   --    Estimated Creatinine Clearance: 45.6 ml/min (by C-G formula based on Cr of 1.17). Medical History: Past Medical History  Diagnosis Date  . Hypertension   . Dilated cardiomyopathy     2/12: EF 30-35%, trivial AI, mild RAE.  EF 2014 40 -45%  . CAD (coronary artery disease)     LHC 9/05 with Dr. Einar Gip:  dLM 20-30%, LAD 85%, oD1 20-30%.  PCI:  Taxus DES to LAD; Dx jailed and tx with POBA.  Last myoview 12/10: inf scar, no ischemia, EF 29%.  . DVT (deep venous thrombosis)   . Pulmonary embolus     chronic coumadin  . Hyperhomocystinemia   . PFO (patent foramen ovale)     Not mentioned on 2014 echo.  [name] HLD (hyperlipidemia)   . Crohn's disease   . Nephrolithiasis   . Diabetes mellitus     11/05/11 "borderline; don't take medications"  . Stroke 1993    "left arm can't hold steady; leg too"  . Arthritis     "used to have a touch in my legs"  . Memory difficulties   . Pneumonia ~ 2011    09-22-13 denies any recent SOB or breathing problems  . Swelling of both ankles      09-22-13 occ.feet, but denies pain.    Medications:  Warfarin 75m/day PTA  Assessment: 73y/o M with hx PE here with flank pain, INR 1.78, CXR consistent with PNA, other labs as above. Per RN, no s/s of bleeding. Hgb is good at 13.5.   Goal of Therapy:  INR 2-3 Monitor platelets by anticoagulation protocol: Yes   Plan:  -Repeat Coumadin  7.5 mg PO x 1 now -Daily PT/INR -Monitor for bleeding  BAlbertina Parr PharmD.  Clinical Pharmacist Pager [phone]

## 2013-11-20 NOTE — Progress Notes (Addendum)
Progress Note   Grant Lara NTZ:[id number] DOB: [birth year] DOA: 11/19/2013 PCP: Lynne Logan, MD   Brief Narrative:   Grant Lara is an 73 y.o. male with history of hypertension, dilated cardiomyopathy, CAD, DVT/PE on Coumadin, HLD, CVA, ongoing tobacco abuse presented to the ED with 2-3 day history of sharp RUQ abdominal/ R lower chest pain. It is worse with movement and breathing. Not really SOB but states he does have a cough productive of bloody sputum that started just before he came over to the ED. Denies fever or sickly contacts. Claims compliance to Coumadin.   Assessment/Plan:   Principal Problem:   CAP (community acquired pneumonia)/pleuritic chest pain/hemoptysis - His presentation could be secondary to community-acquired pneumonia. However patient did not have significant fever or leukocytosis. He has prior history of PE in 2007 and his INR on admission was subtherapeutic. Need to rule out pulmonary infarction/PE which may give you a low-grade fever and leukocytosis.  -  check stat d-dimer and if positive will follow with CTA of chest. Continue empiric IV Rocephin and azithromycin for now.   Active Problems:   Essential hypertension, benign - Continue lisinopril and carvedilol. Controlled       Chronic systolic heart failure/dilated cardiomyopathy/CAD - Compensated.        DM2 (diabetes mellitus, type 2) - Reasonably controlled. Continue SSI. Held oral medications.         Leukocytosis - May be secondary to pneumonia of stress response. Follow CBCs.  History of DVT/PE - CTA chest 2007 shows PE. Patient claims compliance with Coumadin except yesterday while in ED. Follows with [facility] [place].  - Need to consider PE/pulmonary infarction-management as above. - Continue Coumadin per pharmacy.   Tobacco abuse - Cessation counseled. Patient declines nicotine patch.     DVT Prophylaxis: On Coumadin  Code Status: Full. Family Communication: None at bedside    Disposition Plan: Home when stable.   IV Access:    Peripheral IV   Procedures and diagnostic studies:    None.   Medical Consultants:    None.   Other Consultants:    None.   Anti-Infectives:    IV ceftriaxone 8/8 >   IV azithromycin 8/8 >  Subjective:    Grant Lara  complains of right lower chest pain, worse with deep inspiration. Denies abdominal pain, nausea or vomiting. Appetite good. No diarrhea. Cough slightly better.   Objective:    Filed Vitals:   11/20/13 0248 11/20/13 0304 11/20/13 0433 11/20/13 1000  BP: 131/77  116/71 128/68  Pulse: 84  74 77  Temp: 98.2 F (36.8 C)  99.9 F (37.7 C) 99 F (37.2 C)  TempSrc: Oral  Oral Oral  Resp: 17  16 16   Height: 5' 9"  (1.753 m)     Weight: 58.968 kg (130 lb) 59.058 kg (130 lb 3.2 oz)    SpO2: 96%  95% 91%    Intake/Output Summary (Last 24 hours) at 11/20/13 1512 Last data filed at 11/20/13 1001  Gross per 24 hour  Intake    240 ml  Output      0 ml  Net    240 ml    Exam: Gen:  NAD. Elderly male sitting up comfortably in bed.  Cardiovascular:  RRR, No M/R/G Respiratory:  Poor inspiratory effort. Diminished breath sounds in the right base with occasional crackles. No pleural rub. Rest of lung fields clear to auscultation. Gastrointestinal:  Abdomen soft, NT/ND, + BS CNS:  Alert & Oriented. No focal deficits. Extremities:  No C/E/C. Symmetric 5 x 5 power.    Data Reviewed:    Labs: Basic Metabolic Panel:  Recent Labs Lab 11/19/13 2039  NA 138  K 4.1  CL 100  CO2 24  GLUCOSE 196*  BUN 12  CREATININE 1.17  CALCIUM 9.0   GFR Estimated Creatinine Clearance: 45.6 ml/min (by C-G formula based on Cr of 1.17). Liver Function Tests:  Recent Labs Lab 11/19/13 2039  AST 9  ALT 6  ALKPHOS 84  BILITOT 1.7*  PROT 7.0  ALBUMIN 2.7*   No results found for this basename: LIPASE, AMYLASE,  in the last 168 hours No results found for this basename: AMMONIA,  in the last 168  hours Coagulation profile  Recent Labs Lab 11/19/13 2354 11/20/13 0450  INR 1.71* 1.78*    CBC:  Recent Labs Lab 11/19/13 2039  WBC 12.2*  NEUTROABS 10.6*  HGB 13.5  HCT 39.8  MCV 93.2  PLT 173   Cardiac Enzymes: No results found for this basename: CKTOTAL, CKMB, CKMBINDEX, TROPONINI,  in the last 168 hours BNP (last 3 results) No results found for this basename: PROBNP,  in the last 8760 hours CBG:  Recent Labs Lab 11/20/13 0245 11/20/13 0741 11/20/13 1124  GLUCAP 169* 86 177*   D-Dimer: No results found for this basename: DDIMER,  in the last 72 hours Hgb A1c: No results found for this basename: HGBA1C,  in the last 72 hours Lipid Profile: No results found for this basename: CHOL, HDL, LDLCALC, TRIG, CHOLHDL, LDLDIRECT,  in the last 72 hours Thyroid function studies: No results found for this basename: TSH, T4TOTAL, FREET3, T3FREE, THYROIDAB,  in the last 72 hours Anemia work up: No results found for this basename: VITAMINB12, FOLATE, FERRITIN, TIBC, IRON, RETICCTPCT,  in the last 72 hours Sepsis Labs:  Recent Labs Lab 11/19/13 2039  WBC 12.2*   Microbiology No results found for this or any previous visit (from the past 240 hour(s)).   Medications:   . antiseptic oral rinse  7 mL Mouth Rinse BID  . [START ON 11/21/2013] azithromycin  500 mg Intravenous Q24H  . carvedilol  3.125 mg Oral Daily  . [START ON 11/21/2013] cefTRIAXone (ROCEPHIN)  IV  1 g Intravenous Q24H  . donepezil  5 mg Oral QHS  . insulin aspart  0-15 Units Subcutaneous TID WC  . latanoprost  1 drop Both Eyes QHS  . lisinopril  2.5 mg Oral q morning - 10a  . warfarin  7.5 mg Oral ONCE-1800  . Warfarin - Pharmacist Dosing Inpatient   Does not apply q1800   Continuous Infusions: . sodium chloride 75 mL/hr at 11/20/13 0304     Time spent: 40 minutes.   LOS: 1 day   Cortny Bambach, MD, FACP, FHM. [facility] Pager [phone]  If 7PM-7AM, please contact  night-coverage [email or web address] Password [email or web address] 11/20/2013, 3:12 PM     **Disclaimer: This note was dictated with voice recognition software. Similar sounding words can inadvertently be transcribed and this note may contain transcription errors which may not have been corrected upon publication of note.**

## 2013-11-20 NOTE — Progress Notes (Signed)
New Admission Note:   Arrival: via stretcher with ED tech Mental Orientation: A&Ox4 Telemetry: none ordered Assessment:  See doc flowsheet Skin: intact, old incision on right lower abdomen IV: right forearm, clean, dry and infusing upon arrival Pain: some pain in right upper quadrant posterior abdomen Safety Measures:  Call bell placed within reach; patient instructed on use of call bell and verbalized understanding. Bed in lowest position.  Non-skid socks on.  Bed alarm on. [address] Orientation: Patient oriented to staff, room, and unit. Family: none at bedside  Admission questions deferred until morning per patient request.  Orders have been reviewed and implemented. Will continue to monitor.  Arlyss Queen, RN, BSN

## 2013-11-20 NOTE — H&P (Signed)
[facility] History and Physical  Grant Lara:[id number] DOB: 06-28-1940 DOA: 11/19/2013  Referring physician: EDP PCP: Lynne Logan, MD   Chief Complaint: Flank pain   HPI: Grant Lara is a 73 y.o. male who presents to the ED with 2-3 day history of sharp RUQ abdominal R lower chest pain.  It is worse with movement and breathing.  Not really SOB he states but does have a cough productive of bloody sputum that started just before he came over to the ED today.  Pain is 7/10 currently.  No urinary symptoms, no N/V.  Never had pain like this in the past.  CXR in the ED demonstrates RLL opacity c/w PNA.  He is noted to have a mild temperature of 99.0, mild leukocytosis of 12.2.  Review of Systems: Systems reviewed.  As above, otherwise negative  Past Medical History  Diagnosis Date  . Hypertension   . Dilated cardiomyopathy     2/12: EF 30-35%, trivial AI, mild RAE.  EF 2014 40 -45%  . CAD (coronary artery disease)     LHC 9/05 with Dr. Einar Gip:  dLM 20-30%, LAD 85%, oD1 20-30%.  PCI:  Taxus DES to LAD; Dx jailed and tx with POBA.  Last myoview 12/10: inf scar, no ischemia, EF 29%.  . DVT (deep venous thrombosis)   . Pulmonary embolus     chronic coumadin  . Hyperhomocystinemia   . PFO (patent foramen ovale)     Not mentioned on 2014 echo.  [name] HLD (hyperlipidemia)   . Crohn's disease   . Nephrolithiasis   . Diabetes mellitus     11/05/11 "borderline; don't take medications"  . Stroke 1993    "left arm can't hold steady; leg too"  . Arthritis     "used to have a touch in my legs"  . Memory difficulties   . Pneumonia ~ 2011    09-22-13 denies any recent SOB or breathing problems  . Swelling of both ankles      09-22-13 occ.feet, but denies pain.   Past Surgical History  Procedure Laterality Date  . Colon surgery  1994; 1996    "for Crohn's disease"  . Appendectomy     Social History:  reports that he has been smoking Cigarettes.  He has a 26.5 pack-year smoking  history. He has never used smokeless tobacco. He reports that he does not drink alcohol or use illicit drugs.  No Known Allergies  Family History  Problem Relation Age of Onset  . Diabetes type II Mother   . CAD Father   . Diabetes type II Brother      Prior to Admission medications   Medication Sig Start Date End Date Taking? Authorizing Provider  acetaminophen (TYLENOL) 500 MG tablet Take 1,000 mg by mouth every 6 (six) hours as needed (Pain).   Yes Historical Provider, MD  carvedilol (COREG) 3.125 MG tablet Take 3.125 mg by mouth daily.   Yes Historical Provider, MD  donepezil (ARICEPT) 5 MG tablet Take 5 mg by mouth at bedtime.   Yes Historical Provider, MD  glipiZIDE (GLUCOTROL) 5 MG tablet Take 1 tablet (5 mg total) by mouth daily before breakfast. 08/23/13  Yes Debbe Odea, MD  latanoprost (XALATAN) 0.005 % ophthalmic solution Place 1 drop into both eyes at bedtime. 06/29/13  Yes Historical Provider, MD  lisinopril (PRINIVIL,ZESTRIL) 5 MG tablet Take 2.5 mg by mouth every morning.   Yes Historical Provider, MD  warfarin (COUMADIN) 5 MG tablet Take 5  mg by mouth daily. Patient not sure of time he took medication yesterday   Yes Historical Provider, MD  carvedilol (COREG) 3.125 MG tablet Take 1 tablet (3.125 mg total) by mouth 2 (two) times daily. 10/20/12 10/20/13  Minus Breeding, MD  glucose monitoring kit (FREESTYLE) monitoring kit 1 each by Does not apply route as needed for other. Please give 100 lancets and testing strips.  Check sugars every morning before breakfast and every evening before dinner 08/23/13   Debbe Odea, MD   Physical Exam: Filed Vitals:   11/20/13 0045  BP: 133/69  Pulse: 76  Temp:   Resp:     BP 133/69  Pulse 76  Temp(Src) 99 F (37.2 C) (Oral)  Resp 20  Ht _0  (1.753 m)  Wt 71.668 kg (158 lb)  BMI 23.32 kg/m2  SpO2 97%  General Appearance:    Alert, oriented, no distress, appears stated age  Head:    Normocephalic, atraumatic  Eyes:    PERRL,  EOMI, sclera non-icteric        Nose:   Nares without drainage or epistaxis. Mucosa, turbinates normal  Throat:   Moist mucous membranes. Oropharynx without erythema or exudate.  Neck:   Supple. No carotid bruits.  No thyromegaly.  No lymphadenopathy.   Back:     No CVA tenderness, no spinal tenderness  Lungs:     Diminished breath sounds in R lower lung fields.  Chest wall:    No tenderness to palpitation  Heart:    Regular rate and rhythm without murmurs, gallops, rubs  Abdomen:     Soft, non-tender, nondistended, normal bowel sounds, no organomegaly  Genitalia:    deferred  Rectal:    deferred  Extremities:   No clubbing, cyanosis or edema.  Pulses:   2+ and symmetric all extremities  Skin:   Skin color, texture, turgor normal, no rashes or lesions  Lymph nodes:   Cervical, supraclavicular, and axillary nodes normal  Neurologic:   CNII-XII intact. Normal strength, sensation and reflexes      throughout    Labs on Admission:  Basic Metabolic Panel:  Recent Labs Lab 11/19/13 2039  NA 138  K 4.1  CL 100  CO2 24  GLUCOSE 196*  BUN 12  CREATININE 1.17  CALCIUM 9.0   Liver Function Tests:  Recent Labs Lab 11/19/13 2039  AST 9  ALT 6  ALKPHOS 84  BILITOT 1.7*  PROT 7.0  ALBUMIN 2.7*   No results found for this basename: LIPASE, AMYLASE,  in the last 168 hours No results found for this basename: AMMONIA,  in the last 168 hours CBC:  Recent Labs Lab 11/19/13 2039  WBC 12.2*  NEUTROABS 10.6*  HGB 13.5  HCT 39.8  MCV 93.2  PLT 173   Cardiac Enzymes: No results found for this basename: CKTOTAL, CKMB, CKMBINDEX, TROPONINI,  in the last 168 hours  BNP (last 3 results) No results found for this basename: PROBNP,  in the last 8760 hours CBG: No results found for this basename: GLUCAP,  in the last 168 hours  Radiological Exams on Admission: Dg Chest 2 View  11/20/2013   CLINICAL DATA:  Right flank pain  EXAM: CHEST  2 VIEW  COMPARISON:  CT scan 11/05/2011 and  chest radiograph 11/04/2011  FINDINGS: There ismild cardiac enlargement. Vascular pattern is normal. Left lung is clear. On the right, there is consolidation in the superior segment of the left lower lobe. There is no pneumothorax. There is  no significant pleural effusion.  IMPRESSION: Consolidation in the superior segment of the right lower lobe consistent with pneumonia. Radiographic followup after appropriate therapy is recommended to ensure resolution.   Electronically Signed   By: Skipper Cliche M.D.   On: 11/20/2013 01:04    EKG: Independently reviewed.  Assessment/Plan Principal Problem:   CAP (community acquired pneumonia) Active Problems:   Essential hypertension, benign   Chronic systolic heart failure   DM2 (diabetes mellitus, type 2)   Community acquired pneumonia   1. CAP - on PNA pathway 1. Blood and sputum cultures pending, urine for strep and legionella antigen 2. Follow leukocytosis with serial CBCs, no other SIRS criteria at this time. 3. Tylenol ordered PRN in case he develops fever 4. Morphine for pain 5. Gentle hydration given h/o CHF in past 2. HTN - continue home meds 3. DM2 - holding home glipizide and putting him on med dose SSI AC/HS 4. H/o DVT - continue coumadin per pharm consult.    Code Status: Full Code  Family Communication: no family in room Disposition Plan: Admit to inpatient   Time spent: 7 min  Siddiq Kaluzny M. [facility] Pager [phone]  If 7AM-7PM, please contact the day team taking care of the patient [email or web address] Password [email or web address] 11/20/2013, 1:40 AM

## 2013-11-21 DIAGNOSIS — D638 Anemia in other chronic diseases classified elsewhere: Secondary | ICD-10-CM | POA: Diagnosis not present

## 2013-11-21 DIAGNOSIS — I10 Essential (primary) hypertension: Secondary | ICD-10-CM

## 2013-11-21 DIAGNOSIS — F17200 Nicotine dependence, unspecified, uncomplicated: Secondary | ICD-10-CM

## 2013-11-21 DIAGNOSIS — D649 Anemia, unspecified: Secondary | ICD-10-CM

## 2013-11-21 DIAGNOSIS — I2699 Other pulmonary embolism without acute cor pulmonale: Principal | ICD-10-CM

## 2013-11-21 LAB — BASIC METABOLIC PANEL
Anion gap: 12 (ref 5–15)
BUN: 9 mg/dL (ref 6–23)
CO2: 24 mEq/L (ref 19–32)
Calcium: 8.1 mg/dL — ABNORMAL LOW (ref 8.4–10.5)
Chloride: 102 mEq/L (ref 96–112)
Creatinine, Ser: 1.19 mg/dL (ref 0.50–1.35)
GFR calc non Af Amer: 58 mL/min — ABNORMAL LOW (ref >90)
GFR, EST AFRICAN AMERICAN: 67 mL/min — AB (ref >90)
GLUCOSE: 140 mg/dL — AB (ref 70–99)
Potassium: 3.7 mEq/L (ref 3.7–5.3)
Sodium: 138 mEq/L (ref 137–147)

## 2013-11-21 LAB — CBC
HCT: 33.3 % — ABNORMAL LOW (ref 39.0–52.0)
HEMOGLOBIN: 10.9 g/dL — AB (ref 13.0–17.0)
MCH: 30.8 pg (ref 26.0–34.0)
MCHC: 32.7 g/dL (ref 30.0–36.0)
MCV: 94.1 fL (ref 78.0–100.0)
Platelets: 172 10*3/uL (ref 150–400)
RBC: 3.54 MIL/uL — ABNORMAL LOW (ref 4.22–5.81)
RDW: 13.4 % (ref 11.5–15.5)
WBC: 10.8 10*3/uL — ABNORMAL HIGH (ref 4.0–10.5)

## 2013-11-21 LAB — URINE CULTURE: Colony Count: 10000

## 2013-11-21 LAB — GLUCOSE, CAPILLARY
Glucose-Capillary: 97 mg/dL (ref 70–99)
Glucose-Capillary: 162 mg/dL — ABNORMAL HIGH (ref 70–99)
Glucose-Capillary: 116 mg/dL — ABNORMAL HIGH (ref 70–99)
Glucose-Capillary: 163 mg/dL — ABNORMAL HIGH (ref 70–99)

## 2013-11-21 LAB — PROTIME-INR
INR: 2.35 — ABNORMAL HIGH (ref 0.00–1.49)
Prothrombin Time: 25.7 seconds — ABNORMAL HIGH (ref 11.6–15.2)

## 2013-11-21 MED ORDER — WARFARIN SODIUM 4 MG PO TABS
4.0000 mg | ORAL_TABLET | Freq: Once | ORAL | Status: DC
  Filled 2013-11-21: qty 1

## 2013-11-21 MED ORDER — RIVAROXABAN 15 MG PO TABS
15.0000 mg | ORAL_TABLET | Freq: Two times a day (BID) | ORAL | Status: DC
  Administered 2013-11-21 – 2013-11-23 (×4): 15 mg via ORAL
  Filled 2013-11-21 (×6): qty 1

## 2013-11-21 NOTE — Progress Notes (Signed)
Quantitaive D-mers resulted 5.35.Paged M.D.Stat orders done and carried out.

## 2013-11-21 NOTE — Progress Notes (Signed)
Progress Note   Grant Lara CMK:[id number] DOB: 06-24-1940 DOA: 11/19/2013 PCP: Lynne Logan, MD   Brief Narrative:   Grant Lara is an 73 y.o. male with history of hypertension, dilated cardiomyopathy, CAD, DVT/PE on Coumadin, HLD, CVA, ongoing tobacco abuse presented to the ED with 2-3 day history of sharp RUQ abdominal/ R lower chest pain. It is worse with movement and breathing. Not really SOB but states he does have a cough productive of bloody sputum that started just before he came over to the ED. Denies fever or sickly contacts. Claims compliance to Coumadin. 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 However his picture was concerning for PE (no productive cough, no high fevers, subtherapeutic INR on admission in patient with prior PE). CTA chest confirmed PE. - Patient was bridged with full dose Lovenox overnight and Coumadin continued. INR is therapeutic at 2.35. - On careful review, it appears that despite claiming compliance to Coumadin, patient has had several subtherapeutic range INRs in April May and July. Thereby will switch to Xarelto. - DC'ed Abx.  Active Problems:   Essential hypertension, benign - Continue lisinopril and carvedilol. Controlled       Chronic systolic heart failure/dilated cardiomyopathy/CAD - Compensated.        DM2 (diabetes mellitus, type 2) - Reasonably controlled. Continue SSI. Held oral medications.         Leukocytosis - May be secondary to pneumonia of stress response. Follow CBCs.  Tobacco abuse - Cessation counseled. Patient declines nicotine patch.   Anemia - Mild hemoptysis does not explain the drop in hemoglobin from 13.5 > 10.9. Follow CBC in a.m.      DVT Prophylaxis: On Coumadin  Code  Status: Full. Family Communication: None at bedside  Disposition Plan: Home when stable.   IV Access:    Peripheral IV   Procedures and diagnostic studies:    None.   Medical Consultants:    None.   Other Consultants:    None.   Anti-Infectives:    IV ceftriaxone 8/8 >8/9   IV azithromycin 8/8 >8/9  Subjective:    Grant Lara  states that right-sided chest pain and cough have significantly improved. Denies coughing blood since yesterday.  Objective:    Filed Vitals:   11/20/13 1806 11/20/13 2124 11/21/13 0443 11/21/13 0941  BP: 143/78 135/80 119/71 130/69  Pulse: 68 77 72 69  Temp: 99.7 F (37.6 C) 100.8 F (38.2 C) 99.1 F (37.3 C) 99 F (37.2 C)  TempSrc: Oral Oral Oral Oral  Resp: 16 17 16 16   Height:      Weight:  60.963 kg (134 lb 6.4 oz)    SpO2: 96% 94% 95% 97%    Intake/Output Summary (Last 24 hours) at 11/21/13 1438 Last data filed at 11/21/13 1400  Gross per 24 hour  Intake    360 ml  Output    900 ml  Net   -540 ml    Exam: Gen:  NAD. Elderly male sitting up comfortably in bed.  Cardiovascular:  RRR, No M/R/G. Telemetry sinus rhythm. Respiratory:  Poor inspiratory effort. Diminished breath sounds in the right base with occasional crackles. No pleural rub. Rest of lung fields clear to auscultation. Gastrointestinal:  Abdomen soft, NT/ND, + BS  CNS: Alert & Oriented. No focal deficits. Extremities:  No C/E/C. Symmetric 5 x 5 power.    Data Reviewed:    Labs: Basic Metabolic Panel:  Recent Labs Lab 11/19/13 2039 11/21/13 0337  NA 138 138  K 4.1 3.7  CL 100 102  CO2 24 24  GLUCOSE 196* 140*  BUN 12 9  CREATININE 1.17 1.19  CALCIUM 9.0 8.1*   GFR Estimated Creatinine Clearance: 46.3 ml/min (by C-G formula based on Cr of 1.19). Liver Function Tests:  Recent Labs Lab 11/19/13 2039  AST 9  ALT 6  ALKPHOS 84  BILITOT 1.7*  PROT 7.0  ALBUMIN 2.7*   No results found for this basename: LIPASE, AMYLASE,  in  the last 168 hours No results found for this basename: AMMONIA,  in the last 168 hours Coagulation profile  Recent Labs Lab 11/19/13 2354 11/20/13 0450 11/21/13 0337  INR 1.71* 1.78* 2.35*    CBC:  Recent Labs Lab 11/19/13 2039 11/21/13 0337  WBC 12.2* 10.8*  NEUTROABS 10.6*  --   HGB 13.5 10.9*  HCT 39.8 33.3*  MCV 93.2 94.1  PLT 173 172   Cardiac Enzymes: No results found for this basename: CKTOTAL, CKMB, CKMBINDEX, TROPONINI,  in the last 168 hours BNP (last 3 results) No results found for this basename: PROBNP,  in the last 8760 hours CBG:  Recent Labs Lab 11/20/13 1124 11/20/13 1638 11/20/13 2114 11/21/13 0739 11/21/13 1138  GLUCAP 177* 75 148* 97 162*   D-Dimer:  Recent Labs  11/20/13 1555  DDIMER 5.75*   Hgb A1c: No results found for this basename: HGBA1C,  in the last 72 hours Lipid Profile: No results found for this basename: CHOL, HDL, LDLCALC, TRIG, CHOLHDL, LDLDIRECT,  in the last 72 hours Thyroid function studies: No results found for this basename: TSH, T4TOTAL, FREET3, T3FREE, THYROIDAB,  in the last 72 hours Anemia work up: No results found for this basename: VITAMINB12, FOLATE, FERRITIN, TIBC, IRON, RETICCTPCT,  in the last 72 hours Sepsis Labs:  Recent Labs Lab 11/19/13 2039 11/21/13 0337  WBC 12.2* 10.8*   Microbiology Recent Results (from the past 240 hour(s))  URINE CULTURE     Status: None   Collection Time    11/19/13  9:08 PM      Result Value Ref Range Status   Specimen Description URINE, RANDOM   Final   Special Requests NONE   Final   Culture  Setup Time     Final   Value: 11/20/2013 11:27     Performed at [place]     Final   Value: 10,000 COLONIES/ML     Performed at [facility]   Culture     Final   Value: Multiple bacterial morphotypes present, none predominant. Suggest appropriate recollection if clinically indicated.     Performed at [facility]   Report Status  11/21/2013 FINAL   Final  CULTURE, BLOOD (ROUTINE X 2)     Status: None   Collection Time    11/19/13 11:45 PM      Result Value Ref Range Status   Specimen Description BLOOD RIGHT ARM   Final   Special Requests BOTTLES DRAWN AEROBIC AND ANAEROBIC 10CC   Final   Culture  Setup Time     Final   Value: 11/20/2013 03:06     Performed at [facility]   Culture     Final   Value:  BLOOD CULTURE RECEIVED NO GROWTH TO DATE CULTURE WILL BE HELD FOR 5 DAYS BEFORE ISSUING A FINAL NEGATIVE REPORT     Performed at [facility]   Report Status PENDING   Incomplete  CULTURE, BLOOD (ROUTINE X 2)     Status: None   Collection Time    11/19/13 11:55 PM      Result Value Ref Range Status   Specimen Description BLOOD LEFT ARM   Final   Special Requests BOTTLES DRAWN AEROBIC ONLY 10CC   Final   Culture  Setup Time     Final   Value: 11/20/2013 03:06     Performed at [facility]   Culture     Final   Value:        BLOOD CULTURE RECEIVED NO GROWTH TO DATE CULTURE WILL BE HELD FOR 5 DAYS BEFORE ISSUING A FINAL NEGATIVE REPORT     Performed at [facility]   Report Status PENDING   Incomplete     Medications:   . antiseptic oral rinse  7 mL Mouth Rinse BID  . carvedilol  3.125 mg Oral Daily  . donepezil  5 mg Oral QHS  . insulin aspart  0-15 Units Subcutaneous TID WC  . latanoprost  1 drop Both Eyes QHS  . lisinopril  2.5 mg Oral q morning - 10a  . warfarin  4 mg Oral ONCE-1800  . Warfarin - Pharmacist Dosing Inpatient   Does not apply q1800   Continuous Infusions: . sodium chloride 75 mL/hr at 11/20/13 0304     Time spent: 40 minutes.   LOS: 2 days   Layani Foronda, MD, FACP, FHM. [facility] Pager [phone]  If 7PM-7AM, please contact night-coverage [email or web address] Password [email or web address] 11/21/2013, 2:38 PM     **Disclaimer: This note was dictated with voice recognition software. Similar sounding words can inadvertently be transcribed and this  note may contain transcription errors which may not have been corrected upon publication of note.**

## 2013-11-21 NOTE — Care Management Note (Signed)
    Page 1 of 1   11/21/2013     2:22:57 PM CARE MANAGEMENT NOTE 11/21/2013  Patient:  MORDECAI, TINDOL   Account Number:  [id number]  Date Initiated:  11/21/2013  Documentation initiated by:  [facility]  Subjective/Objective Assessment:   adm: hx PE     Action/Plan:   med asst   Anticipated DC Date:  11/22/2013   Anticipated DC Plan:  [place]  CM consult  Medication Assistance      Choice offered to / List presented to:             Status of service:   Medicare Important Message given?   (If response is "NO", the following Medicare IM given date fields will be blank) Date Medicare IM given:   Medicare IM given by:   Date Additional Medicare IM given:   Additional Medicare IM given by:    Discharge Disposition:    Per UR Regulation:    If discussed at Long Length of Stay Meetings, dates discussed:    Comments:  11/21/13 08:00 Cm received call from MD to see if pt's pharmacy could do a cost run for Xarelto and Eiiquis and to check affordability with pt.  CM spoke with pt who stated he uses the [facility] on [address].  CM called pharmacy who stated they do not run scripts for cost as they cannot incur that cost, however pharmacy states Xarelto may be the more affordable choice as pt might qualify for asst program.  Pharmacist gave a ballpark range of over $20 per month however pt states this would be a hardship.  MD made aware.  CM will follow tomorrow to explore asst programs and this CM gave pt apre-activated  free 30 day trial card. CM will follow this pt. Mariane Masters, BSN, CM [phone].

## 2013-11-21 NOTE — Progress Notes (Addendum)
ANTICOAGULATION CONSULT NOTE - Follow Up Consult  Pharmacy Consult for Warfarin  Indication: hx PE  No Known Allergies  Patient Measurements: Height: 5' 9"  (175.3 cm) Weight: 134 lb 6.4 oz (60.963 kg) IBW/kg (Calculated) : 70.7  Vital Signs: Temp: 99 F (37.2 C) (08/09 0941) Temp src: Oral (08/09 0941) BP: 130/69 mmHg (08/09 0941) Pulse Rate: 69 (08/09 0941)  Labs:  Recent Labs  11/19/13 2039 11/19/13 2354 11/20/13 0450 11/21/13 0337  HGB 13.5  --   --  10.9*  HCT 39.8  --   --  33.3*  PLT 173  --   --  172  LABPROT  --  20.1* 20.7* 25.7*  INR  --  1.71* 1.78* 2.35*  CREATININE 1.17  --   --  1.19   Estimated Creatinine Clearance: 46.3 ml/min (by C-G formula based on Cr of 1.19). Medical History: Past Medical History  Diagnosis Date  . Hypertension   . Dilated cardiomyopathy     2/12: EF 30-35%, trivial AI, mild RAE.  EF 2014 40 -45%  . CAD (coronary artery disease)     LHC 9/05 with Dr. Einar Gip:  dLM 20-30%, LAD 85%, oD1 20-30%.  PCI:  Taxus DES to LAD; Dx jailed and tx with POBA.  Last myoview 12/10: inf scar, no ischemia, EF 29%.  . DVT (deep venous thrombosis)   . Pulmonary embolus     chronic coumadin  . Hyperhomocystinemia   . PFO (patent foramen ovale)     Not mentioned on 2014 echo.  [name] HLD (hyperlipidemia)   . Crohn's disease   . Nephrolithiasis   . Diabetes mellitus     11/05/11 "borderline; don't take medications"  . Stroke 1993    "left arm can't hold steady; leg too"  . Arthritis     "used to have a touch in my legs"  . Memory difficulties   . Pneumonia ~ 2011    09-22-13 denies any recent SOB or breathing problems  . Swelling of both ankles      09-22-13 occ.feet, but denies pain.    Medications:  Warfarin 85m/day PTA  Assessment: 73y/o M with hx PE here with flank pain. Currently on Coumadin and Lovenox bridge for h/o or PE. INR is therapeutic today at 2.35. H/H has trended down today but per RN, no s/s of bleeding. Plt remain stable.  Noted interaction with azithromycin.   Goal of Therapy:  INR 2-3 Monitor platelets by anticoagulation protocol: Yes   Plan:  -Stop Lovenox since INR > 2  -Warfarin 4 mg PO x 1 today given significant INR trend up since yesterday  -Daily PT/INR -Monitor for bleeding   BAlbertina Parr PharmD.  Clinical Pharmacist Pager [phone]  Addendum: Per discussion with Dr. HAlgis Liming patient will be switched from Coumadin to XDoney Parksince CTA chest confirmed PE despite the patient being on Coumadin. The patient claims compliance to Coumadin. She received a Lovenox dose this AM and her INR today is <3. CrCl ~ 50 mL/min   Plan:  -Stop Coumadin and Lovenox  -Start Xarelto 15 mg twice daily at 2000 (12-hrs post Lovenox dose) tonight x 21 days  -After 21 days, switch to Xarelto 20 mg daily  -Monitor for s/s of bleeding  -Xarelto education due   BAlbertina Parr PharmD.  Clinical Pharmacist Pager [phone]

## 2013-11-21 NOTE — Progress Notes (Signed)
Radiolgist MD called about the positive result of C-T angiogram of this patient.Primary MD made aware.Stat orders done and carried out.Patient still at radiology at this time.Incoming nurse made aware.

## 2013-11-22 DIAGNOSIS — I5022 Chronic systolic (congestive) heart failure: Secondary | ICD-10-CM

## 2013-11-22 DIAGNOSIS — I10 Essential (primary) hypertension: Secondary | ICD-10-CM

## 2013-11-22 DIAGNOSIS — I359 Nonrheumatic aortic valve disorder, unspecified: Secondary | ICD-10-CM

## 2013-11-22 LAB — GLUCOSE, CAPILLARY
GLUCOSE-CAPILLARY: 179 mg/dL — AB (ref 70–99)
Glucose-Capillary: 85 mg/dL (ref 70–99)
Glucose-Capillary: 152 mg/dL — ABNORMAL HIGH (ref 70–99)
Glucose-Capillary: 123 mg/dL — ABNORMAL HIGH (ref 70–99)

## 2013-11-22 LAB — CBC
HEMATOCRIT: 33.8 % — AB (ref 39.0–52.0)
HEMOGLOBIN: 11 g/dL — AB (ref 13.0–17.0)
MCH: 30.8 pg (ref 26.0–34.0)
MCHC: 32.5 g/dL (ref 30.0–36.0)
MCV: 94.7 fL (ref 78.0–100.0)
Platelets: 230 10*3/uL (ref 150–400)
RBC: 3.57 MIL/uL — ABNORMAL LOW (ref 4.22–5.81)
RDW: 13.7 % (ref 11.5–15.5)
WBC: 9 10*3/uL (ref 4.0–10.5)

## 2013-11-22 LAB — PROTIME-INR
INR: 10.13 (ref 0.00–1.49)
INR: 7.73 (ref 0.00–1.49)
PROTHROMBIN TIME: 80.6 s — AB (ref 11.6–15.2)
Prothrombin Time: 65.3 seconds — ABNORMAL HIGH (ref 11.6–15.2)

## 2013-11-22 LAB — CG4 I-STAT (LACTIC ACID): LACTIC ACID, VENOUS: 1.23 mmol/L (ref 0.5–2.2)

## 2013-11-22 MED ORDER — RIVAROXABAN 20 MG PO TABS: 20.0000 mg | ORAL_TABLET | Freq: Every day | ORAL | Status: DC

## 2013-11-22 NOTE — Progress Notes (Signed)
  Echocardiogram 2D Echocardiogram has been performed.  Mauricio Po 11/22/2013, 11:59 AM

## 2013-11-22 NOTE — Consult Note (Signed)
CONSULT NOTE  Date: 11/22/2013               Patient Name:  Grant Lara MRN: [id number]  DOB: 03-Nov-1940 Age / Sex: 73 y.o., male        PCP: Lynne Logan Primary Cardiologist: Hochrein            Referring Physician: Algis Liming              Reason for Consult: Worsening EF           History of Present Illness: Patient is a 73 y.o. male with a PMHx of chronic diastolic congestive heart failure, chronic DVT with pulmonary emboli and, who was admitted to [facility] on 11/19/2013 for evaluation of hemoptysis and pleuritic chest pain. Was found to have new pulmonary Emboli. Echocardiogram revealed a decreased ejection fraction and we are asked to comment.[name]   He did he's been doing well until recently. He admits to being a little bit careless with eating salt. He also admits to eating lots of green vegetables.   His INR has not been consistently therapeutic He has been taking his medications as prescribed but eats lots of green vegetables.  He was admitted with right upper quadrant pain, shortness of breath, hemoptysis, and pleuritic chest pain. An echocardiogram today reveals an ejection fraction of around 30-35% which is decreased from his previous echo several years ago. He denies any leg edema. He denies PND. He's been doing well until he had this recent pulmonary embolus.   Medications: Outpatient medications: Prescriptions prior to admission  Medication Sig Dispense Refill  . acetaminophen (TYLENOL) 500 MG tablet Take 1,000 mg by mouth every 6 (six) hours as needed (Pain).      . carvedilol (COREG) 3.125 MG tablet Take 3.125 mg by mouth daily.      [name] donepezil (ARICEPT) 5 MG tablet Take 5 mg by mouth at bedtime.      [name] glipiZIDE (GLUCOTROL) 5 MG tablet Take 1 tablet (5 mg total) by mouth daily before breakfast.  30 tablet  0  . latanoprost (XALATAN) 0.005 % ophthalmic solution Place 1 drop into both eyes at bedtime.      [name] lisinopril (PRINIVIL,ZESTRIL) 5 MG tablet Take 2.5 mg by  mouth every morning.      . warfarin (COUMADIN) 5 MG tablet Take 5 mg by mouth daily. Patient not sure of time he took medication yesterday      . carvedilol (COREG) 3.125 MG tablet Take 1 tablet (3.125 mg total) by mouth 2 (two) times daily.  60 tablet  11  . glucose monitoring kit (FREESTYLE) monitoring kit 1 each by Does not apply route as needed for other. Please give 100 lancets and testing strips.  Check sugars every morning before breakfast and every evening before dinner  1 each  0    Current medications: Current Facility-Administered Medications  Medication Dose Route Frequency Provider Last Rate Last Dose  . acetaminophen (TYLENOL) tablet 650 mg  650 mg Oral Q6H PRN Modena Jansky, MD   650 mg at 11/21/13 2131  . antiseptic oral rinse (CPC / CETYLPYRIDINIUM CHLORIDE 0.05%) solution 7 mL  7 mL Mouth Rinse BID Etta Quill, DO   7 mL at 11/22/13 1011  . carvedilol (COREG) tablet 3.125 mg  3.125 mg Oral Daily Etta Quill, DO   3.125 mg at 11/22/13 1011  . donepezil (ARICEPT) tablet 5 mg  5 mg Oral QHS Etta Quill,  DO   5 mg at 11/21/13 2131  . HYDROcodone-acetaminophen (NORCO/VICODIN) 5-325 MG per tablet 1-2 tablet  1-2 tablet Oral Q6H PRN Modena Jansky, MD      . insulin aspart (novoLOG) injection 0-15 Units  0-15 Units Subcutaneous TID WC Etta Quill, DO   3 Units at 11/21/13 1210  . latanoprost (XALATAN) 0.005 % ophthalmic solution 1 drop  1 drop Both Eyes QHS Etta Quill, DO   1 drop at 11/21/13 2132  . lisinopril (PRINIVIL,ZESTRIL) tablet 2.5 mg  2.5 mg Oral q morning - 10a Etta Quill, DO   2.5 mg at 11/22/13 1011  . morphine 2 MG/ML injection 2-4 mg  2-4 mg Intravenous Q4H PRN Etta Quill, DO      . Rivaroxaban (XARELTO) tablet 15 mg  15 mg Oral BID WC Darnell Level Mancheril, RPH   15 mg at 11/22/13 1010  . [START ON 12/13/2013] rivaroxaban (XARELTO) tablet 20 mg  20 mg Oral Q supper Anh P Pham, RPH         No Known Allergies   Past Medical History    Diagnosis Date  . Hypertension   . Dilated cardiomyopathy     2/12: EF 30-35%, trivial AI, mild RAE.  EF 2014 40 -45%  . CAD (coronary artery disease)     LHC 9/05 with Dr. Einar Gip:  dLM 20-30%, LAD 85%, oD1 20-30%.  PCI:  Taxus DES to LAD; Dx jailed and tx with POBA.  Last myoview 12/10: inf scar, no ischemia, EF 29%.  . DVT (deep venous thrombosis)   . Pulmonary embolus     chronic coumadin  . Hyperhomocystinemia   . PFO (patent foramen ovale)     Not mentioned on 2014 echo.  [name] HLD (hyperlipidemia)   . Crohn's disease   . Nephrolithiasis   . Diabetes mellitus     11/05/11 "borderline; don't take medications"  . Stroke 1993    "left arm can't hold steady; leg too"  . Arthritis     "used to have a touch in my legs"  . Memory difficulties   . Pneumonia ~ 2011    09-22-13 denies any recent SOB or breathing problems  . Swelling of both ankles      09-22-13 occ.feet, but denies pain.    Past Surgical History  Procedure Laterality Date  . Colon surgery  1994; 1996    "for Crohn's disease"  . Appendectomy      Family History  Problem Relation Age of Onset  . Diabetes type II Mother   . CAD Father   . Diabetes type II Brother     Social History:  reports that he has been smoking Cigarettes.  He has a 26.5 pack-year smoking history. He has never used smokeless tobacco. He reports that he does not drink alcohol or use illicit drugs.   Review of Systems: Constitutional:  denies fever, chills, diaphoresis, appetite change and fatigue.  HEENT: denies photophobia, eye pain, redness, hearing loss, ear pain, congestion, sore throat, rhinorrhea, sneezing, neck pain, neck stiffness and tinnitus.  Respiratory: admits to SOB, DOE, cough, hemoptysis  Cardiovascular: admits to chest pain, palpitations and leg swelling.  Gastrointestinal: denies nausea, vomiting, abdominal pain, diarrhea, constipation, blood in stool.  Genitourinary: denies dysuria, urgency, frequency, hematuria, flank  pain and difficulty urinating.  Musculoskeletal: denies  myalgias, back pain, joint swelling, arthralgias and gait problem.   Skin: denies pallor, rash and wound.  Neurological: denies dizziness, seizures, syncope, weakness,  light-headedness, numbness and headaches.   Hematological: denies adenopathy, easy bruising, personal or family bleeding history.  Psychiatric/ Behavioral: denies suicidal ideation, mood changes, confusion, nervousness, sleep disturbance and agitation.    Physical Exam: BP 124/76  Pulse 59  Temp(Src) 98.3 F (36.8 C) (Oral)  Resp 17  Ht 5' 9"  (1.753 m)  Wt 136 lb 3.2 oz (61.78 kg)  BMI 20.10 kg/m2  SpO2 94%  Wt Readings from Last 3 Encounters:  11/21/13 136 lb 3.2 oz (61.78 kg)  08/21/13 139 lb 12.8 oz (63.413 kg)  07/20/13 149 lb (67.586 kg)    General: Vital signs reviewed and noted. Well-developed, well-nourished, in no acute distress; alert,   Head: Normocephalic, atraumatic, sclera anicteric,   Neck: Supple. Negative for carotid bruits. No JVD   Lungs:  Clear bilaterally, no  wheezes, rales, or rhonchi. Breathing is normal   Heart: RRR with S1 S2. No murmurs, rubs, or gallops   Abdomen:  Soft, non-tender, non-distended with normoactive bowel sounds. No hepatomegaly. No rebound/guarding. No obvious abdominal masses   MSK: Strength and the appear normal for age.   Extremities: No clubbing or cyanosis. No edema.  Distal pedal pulses are 2+ and equal   Neurologic: Alert and oriented X 3. Moves all extremities spontaneously.  Psych: Responds to questions appropriately with a normal affect.     Lab results: Basic Metabolic Panel:  Recent Labs Lab 11/19/13 2039 11/21/13 0337  NA 138 138  K 4.1 3.7  CL 100 102  CO2 24 24  GLUCOSE 196* 140*  BUN 12 9  CREATININE 1.17 1.19  CALCIUM 9.0 8.1*    Liver Function Tests:  Recent Labs Lab 11/19/13 2039  AST 9  ALT 6  ALKPHOS 84  BILITOT 1.7*  PROT 7.0  ALBUMIN 2.7*   No results found for this  basename: LIPASE, AMYLASE,  in the last 168 hours No results found for this basename: AMMONIA,  in the last 168 hours  CBC:  Recent Labs Lab 11/19/13 2039 11/21/13 0337  WBC 12.2* 10.8*  NEUTROABS 10.6*  --   HGB 13.5 10.9*  HCT 39.8 33.3*  MCV 93.2 94.1  PLT 173 172    Cardiac Enzymes: No results found for this basename: CKTOTAL, CKMB, CKMBINDEX, TROPONINI,  in the last 168 hours  BNP: No components found with this basename: POCBNP,   CBG:  Recent Labs Lab 11/21/13 1138 11/21/13 1647 11/21/13 2117 11/22/13 0811 11/22/13 1200  GLUCAP 162* 116* 163* 85 152*    Coagulation Studies:  Recent Labs  11/21/13 0337 11/22/13 0435 11/22/13 0714  LABPROT 25.7* 80.6* 65.3*  INR 2.35* 10.13* 7.73*     Other results: EKG :  NSR , no acute changes   Imaging: Ct Angio Chest Pe W/cm &/or Wo Cm  11/20/2013   CLINICAL DATA:  Right-sided pain with breathing, history of pulmonary embolism and deep venous thrombosis  EXAM: CT ANGIOGRAPHY CHEST WITH CONTRAST  TECHNIQUE: Multidetector CT imaging of the chest was performed using the standard protocol during bolus administration of intravenous contrast. Multiplanar CT image reconstructions and MIPs were obtained to evaluate the vascular anatomy.  CONTRAST:  158m OMNIPAQUE IOHEXOL 350 MG/ML SOLN  COMPARISON:  11/05/2011  FINDINGS: There is filling defect centrally within the proximal right lower lobe pulmonary artery, causing near complete occlusion of lower lobe branches. There is a small loculated pleural effusion in the region of the right lower lobe and there is extensive consolidation throughout the right lower lobe with air bronchograms.  Left pulmonary arterial system is clear. Right upper lobe and middle lobe arteries are clear as well. Ratio of right ventricle to left ventricle is 0.74, not consistent with significant right heart strain. The left lung is clear. There is filling defect in left lower lobe bronchus and branches. This  suggests mucous plugging.  No evidence of aortic dilatation. There is coronary arterial calcification with mild cardiomegaly.  There are partially visualized right renal cysts as seen on prior studies. There are no acute musculoskeletal findings.  Review of the MIP images confirms the above findings.  IMPRESSION: Acute extensive obstructing right lower lobe pulmonary embolism with evidence of pulmonary infarction. Small loculated right pleural effusion also identified. Findings do not suggest right heart strain. Critical Value/emergent results were called by telephone at the time of interpretation on 11/20/2013 at 7:27 pm to Dr. Vernell Leep , who verbally acknowledged these results.   Electronically Signed   By: Skipper Cliche M.D.   On: 11/20/2013 19:27        Assessment & Plan:  1. Chronic systolic congestive heart failure:   The patient has known chronic systolic congestive heart failure. His ejection fraction is little bit lower than his previous echo but this may be because of some salt indiscretion.  Is not in florid heart failure at this point I do not think that we necessarily need to change his medications.   His heart rate and blood pressure. Be fairly well-controlled. Continue with same medications.  2. Pulmonary emboli: His pulmonary emboli and DVT may be due to the fact that his INR has not been consistently therapeutic.  We may consider him for Xarelto which actually worked better for him since his INR levels have not been consistent.  His echocardiogram does not reveal an enlarged or hypocontractile right ventricle.  While he certainly has a pulmonary infarct, I do not think that he has RV failure at this time.  3. Hypertension: Stable    Ramond Dial., MD, [facility] 11/22/2013, 4:42 PM Office - [phone] Pager [phone]

## 2013-11-22 NOTE — Progress Notes (Signed)
UR completed. Strider Vallance RN CCM Case Mgmt phone [phone] 

## 2013-11-22 NOTE — Progress Notes (Signed)
CRITICAL VALUE ALERT  Critical value received:  PT=80.6, INR=10.13 Date of notification:  11/22/13 Time of notification: 0606 Critical value read back:Yes.   Nurse who received alert:  Earleen Reaper, RN relayed to Georgeanna Harrison, RN MD notified (1st page):  Raliegh Ip Schorr Time of first page:  [phone] MD notified (2nd page):  Time of second page:  Responding MD: Lamar Blinks Time MD responded:  [phone]

## 2013-11-22 NOTE — Discharge Instructions (Addendum)
Information on my medicine - XARELTO (rivaroxaban)  This medication education was reviewed with me or my healthcare representative as part of my discharge preparation.  The pharmacist that spoke with me during my hospital stay was:  Lynelle Doctor, [place]? Xarelto was prescribed to treat blood clots that may have been found in the veins of your legs (deep vein thrombosis) or in your lungs (pulmonary embolism) and to reduce the risk of them occurring again.  What do you need to know about Xarelto? The starting dose is one 15 mg tablet taken TWICE daily with food for the FIRST 21 DAYS then on 12/13/2013  the dose is changed to one 20 mg tablet taken ONCE A DAY with your evening meal.  DO NOT stop taking Xarelto without talking to the health care provider who prescribed the medication.  Refill your prescription for 20 mg tablets before you run out.  After discharge, you should have regular check-up appointments with your healthcare provider that is prescribing your Xarelto.  In the future your dose may need to be changed if your kidney function changes by a significant amount.  What do you do if you miss a dose? If you are taking Xarelto TWICE DAILY and you miss a dose, take it as soon as you remember. You may take two 15 mg tablets (total 30 mg) at the same time then resume your regularly scheduled 15 mg twice daily the next day.  If you are taking Xarelto ONCE DAILY and you miss a dose, take it as soon as you remember on the same day then continue your regularly scheduled once daily regimen the next day. Do not take two doses of Xarelto at the same time.   Important Safety Information Xarelto is a blood thinner medicine that can cause bleeding. You should call your healthcare provider right away if you experience any of the following:   Bleeding from an injury or your nose that does not stop.   Unusual colored urine (red or dark brown) or unusual colored  stools (red or black).   Unusual bruising for unknown reasons.   A serious fall or if you hit your head (even if there is no bleeding).  Some medicines may interact with Xarelto and might increase your risk of bleeding while on Xarelto. To help avoid this, consult your healthcare provider or pharmacist prior to using any new prescription or non-prescription medications, including herbals, vitamins, non-steroidal anti-inflammatory drugs (NSAIDs) and supplements.  This website has more information on Xarelto: [email or web address].[email or web address]   Pulmonary Embolism A pulmonary (lung) embolism (PE) is a blood clot that has traveled to the lung and results in a blockage of blood flow in the affected lung. Most clots come from deep veins in the legs or pelvis. PE is a dangerous and potentially life-threatening condition that can be treated if identified. CAUSES Blood clots form in a vein for different reasons. Usually several things cause blood clots. They include:  The flow of blood slows down.  The inside of the vein is damaged in some way.  The person has a condition that makes the blood clot more easily. RISK FACTORS Some people are more likely than others to develop PE. Risk factors include:   Smoking.  Being overweight (obese).  Sitting or lying still for a long time. This includes long-distance travel, paralysis, or recovery from an illness or surgery. Other factors that increase risk are:   Older age, especially over 3  years of age.  Having a family history of blood clots or if you have already had a blood clot.  Having major or lengthy surgery. This is especially true for surgery on the hip, knee, or belly (abdomen). Hip surgery is particularly high risk.  Having a long, thin tube (catheter) placed inside a vein during a medical procedure.  Breaking a hip or leg.  Having cancer or cancer treatment.  Medicines containing the male hormone estrogen. This includes birth control pills and  hormone replacement therapy.  Other circulation or heart problems.  Pregnancy and childbirth.  Hormone changes make the blood clot more easily during pregnancy.  The fetus puts pressure on the veins of the pelvis.  There is a risk of injury to veins during delivery or a caesarean delivery. The risk is highest just after childbirth.  PREVENTION   Exercise the legs regularly. Take a brisk 30 minute walk every day.  Maintain a weight that is appropriate for your height.  Avoid sitting or lying in bed for long periods of time without moving your legs.  Women, particularly those over the age of 40 years, should consider the risks and benefits of taking estrogen medicines, including birth control pills.  Do not smoke, especially if you take estrogen medicines.  Long-distance travel can increase your risk. You should exercise your legs by walking or pumping the muscles every hour.  Many of the risk factors above relate to situations that exist with hospitalization, either for illness, injury, or elective surgery. Prevention may include medical and nonmedical measures.   Your health care provider will assess you for the need for venous thromboembolism prevention when you are admitted to the hospital. If you are having surgery, your surgeon will assess you the day of or day after surgery.  SYMPTOMS  The symptoms of a PE usually start suddenly and include:  Shortness of breath.  Coughing.  Coughing up blood or blood-tinged mucus.  Chest pain. Pain is often worse with deep breaths.  Rapid heartbeat. DIAGNOSIS  If a PE is suspected, your health care provider will take a medical history and perform a physical exam. Other tests that may be required include:  Blood tests, such as studies of the clotting properties of your blood.  Imaging tests, such as ultrasound, CT, MRI, and other tests to see if you have clots in your legs or lungs.  An electrocardiogram. This can look for  heart strain from blood clots in the lungs. TREATMENT   The most common treatment for a PE is blood thinning (anticoagulant) medicine, which reduces the blood's tendency to clot. Anticoagulants can stop new blood clots from forming and old clots from growing. They cannot dissolve existing clots. Your body does this by itself over time. Anticoagulants can be given by mouth, through an intravenous (IV) tube, or by injection. Your health care provider will determine the best program for you.  Less commonly, clot-dissolving medicines (thrombolytics) are used to dissolve a PE. They carry a high risk of bleeding, so they are used mainly in severe cases.  Very rarely, a blood clot in the leg needs to be removed surgically.  If you are unable to take anticoagulants, your health care provider may arrange for you to have a filter placed in a main vein in your abdomen. This filter prevents clots from traveling to your lungs. HOME CARE INSTRUCTIONS   Take all medicines as directed by your health care provider.  Learn as much as you can about DVT.  Wear a medical alert bracelet or carry a medical alert card.  Ask your health care provider how soon you can go back to normal activities. It is important to stay active to prevent blood clots. If you are on anticoagulant medicine, avoid contact sports.  It is very important to exercise. This is especially important while traveling, sitting, or standing for long periods of time. Exercise your legs by walking or by tightening and relaxing your leg muscles regularly. Take frequent walks.  You may need to wear compression stockings. These are tight elastic stockings that apply pressure to the lower legs. This pressure can help keep the blood in the legs from clotting. Taking Warfarin Warfarin is a daily medicine that is taken by mouth. Your health care provider will advise you on the length of treatment (usually 3-6 months, sometimes lifelong). If you take  warfarin:  Understand how to take warfarin and foods that can affect how warfarin works in Veterinary surgeon.  Too much and too little warfarin are both dangerous. Too much warfarin increases the risk of bleeding. Too little warfarin continues to allow the risk for blood clots. Warfarin and Regular Blood Testing While taking warfarin, you will need to have regular blood tests to measure your blood clotting time. These blood tests usually include both the prothrombin time (PT) and international normalized ratio (INR) tests. The PT and INR results allow your health care provider to adjust your dose of warfarin. It is very important that you have your PT and INR tested as often as directed by your health care provider.  Warfarin and Your Diet Avoid major changes in your diet, or notify your health care provider before changing your diet. Arrange a visit with a registered dietitian to answer your questions. Many foods, especially foods high in vitamin K, can interfere with warfarin and affect the PT and INR results. You should eat a consistent amount of foods high in vitamin K. Foods high in vitamin K include:   Spinach, kale, broccoli, cabbage, collard and turnip greens, Brussels sprouts, peas, cauliflower, seaweed, and parsley.  Beef and pork liver.  Green tea.  Soybean oil. Warfarin with Other Medicines Many medicines can interfere with warfarin and affect the PT and INR results. You must:  Tell your health care provider about any and all medicines, vitamins, and supplements you take, including aspirin and other over-the-counter anti-inflammatory medicines. Be especially cautious with aspirin and anti-inflammatory medicines. Ask your health care provider before taking these.  Do not take or discontinue any prescribed or over-the-counter medicine except on the advice of your health care provider or pharmacist. Warfarin Side Effects Warfarin can have side effects, such as easy bruising and difficulty  stopping bleeding. Ask your health care provider or pharmacist about other side effects of warfarin. You will need to:  Hold pressure over cuts for longer than usual.  Notify your dentist and other health care providers that you are taking warfarin before you undergo any procedures where bleeding may occur. Warfarin with Alcohol and Tobacco   Drinking alcohol frequently can increase the effect of warfarin, leading to excess bleeding. It is best to avoid alcoholic drinks or consume only very small amounts while taking warfarin. Notify your health care provider if you change your alcohol intake.  Do not use any tobacco products including cigarettes, chewing tobacco, or electronic cigarettes. If you smoke, quit. Ask your health care provider for help with quitting smoking. Alternative Medicines to Warfarin: Factor Xa Inhibitor Medicines  These blood thinning  medicines are taken by mouth, usually for several weeks or longer. It is important to take the medicine every single day, at the same time each day.  There are no regular blood tests required when using these medicines.  There are fewer food and drug interactions than with warfarin.  The side effects of this class of medicine is similar to that of warfarin, including excessive bruising or bleeding. Ask your health care provider or pharmacist about other potential side effects. SEEK MEDICAL CARE IF:   You notice a rapid heartbeat.  You feel weaker or more tired than usual.  You feel faint.  You notice increased bruising.  Your symptoms are not getting better in the time expected.  You are having side effects of medicine. SEEK IMMEDIATE MEDICAL CARE IF:   You have chest pain.  You have trouble breathing.  You have new or increased swelling or pain in one leg.  You cough up blood.  You notice blood in vomit, in a bowel movement, or in urine.  You have a fever. Symptoms of PE may represent a serious problem that is an  emergency. Do not wait to see if the symptoms will go away. Get medical help right away. Call your local emergency services (911 in the [place]). Do not drive yourself to the hospital. Document Released: 03/29/2000 Document Revised: 08/16/2013 Document Reviewed: 04/12/2013 [facility] Patient Information 2015 [place], [place]. This information is not intended to replace advice given to you by your health care provider. Make sure you discuss any questions you have with your health care provider.

## 2013-11-22 NOTE — Progress Notes (Signed)
CRITICAL VALUE ALERT  Critical value received:  PT 65.3 & INR 7.73  Date of notification:  11/22/2013  Time of notification:  0850  Critical value read back:Yes.    Nurse who received alert:  Marijean Heath  MD notified (1st page):  Dr. Algis Liming  Time of first page:  [phone]  MD notified (2nd page):  Time of second page:  Responding MD:  Dr. Algis Liming  Time MD responded:  [phone]

## 2013-11-22 NOTE — Progress Notes (Signed)
Progress Note   Grant Lara OEV:[id number] DOB: 12-28-1940 DOA: 11/19/2013 PCP: Lynne Logan, MD   Brief Narrative:   Grant Lara is an 73 y.o. male with history of hypertension, dilated cardiomyopathy LVEF 40-45% (05/26/12), CAD, DVT/PE on Coumadin, HLD, CVA, ongoing tobacco abuse presented to the ED with 2-3 day history of sharp RUQ abdominal/ R lower chest pain. It is worse with movement and breathing. Not really SOB but states he does have a cough productive of bloody sputum that started just before he came over to the ED. Denies fever or sickly contacts. Claims compliance to Coumadin. 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 However his picture was concerning for PE (no productive cough, no high fevers, subtherapeutic INR on admission in patient with prior PE). CTA chest confirmed PE. - Patient was bridged with full dose Lovenox overnight and Coumadin continued. INR is therapeutic at 2.35. - On careful review, it appears that despite claiming compliance to Coumadin, patient has had several subtherapeutic range INRs in April May and July. Thereby switched to Xarelto on 11/21/13. Markedly elevated INR likely due to Xarelto effect. - DC'ed Abx. - Echo: No right heart strain.  Active Problems:   Essential hypertension, benign - Continue lisinopril and carvedilol. Controlled       Chronic systolic heart failure/dilated cardiomyopathy/CAD - Compensated.  - Repeat 2-D echo shows worsening LVEF 25-30% and diffuse hypokinesis. Continue lisinopril and carvedilol. Will consult cardiology for additional management recommendations. - Tele 8/9 AM: 5 beats of Idioventricular rhythm ? NSVT       DM2 (diabetes mellitus, type 2) - Reasonably controlled. Continue  SSI. Held oral medications.         Leukocytosis - May be secondary to stress response. Resolved  Tobacco abuse - Cessation counseled. Patient declines nicotine patch.   Anemia - Mild hemoptysis does not explain the drop in hemoglobin from 13.5 > 10.9. Follow CBC in a.m. hemoptysis resolved.      DVT Prophylaxis: On Xarelto  Code Status: Full. Family Communication: Discussed with spouse on 8/10. Disposition Plan: Home when stable.   IV Access:    Peripheral IV   Procedures and diagnostic studies:    None.   Medical Consultants:    Cardiology   Other Consultants:    None.   Anti-Infectives:    IV ceftriaxone 8/8 >8/9   IV azithromycin 8/8 >8/9  Subjective:    Grant Lara  states that right-sided chest pain and cough have significantly improved. Denies coughing blood since 8/8  Objective:    Filed Vitals:   11/21/13 1945 11/21/13 2119 11/22/13 0430 11/22/13 1104  BP:  127/74 138/72 124/76  Pulse:  67 61 59  Temp:  99.9 F (37.7 C) 98.4 F (36.9 C) 98.3 F (36.8 C)  TempSrc:  Oral Oral Oral  Resp:  17 16 17   Height:      Weight:  61.78 kg (136 lb 3.2 oz)    SpO2: 95% 99% 96% 94%    Intake/Output Summary (Last 24 hours) at 11/22/13 1608 Last data filed at 11/22/13 0835  Gross per 24 hour  Intake    342 ml  Output    250 ml  Net     92 ml    Exam: Gen:  NAD. Elderly male sitting up comfortably in bed.  Cardiovascular:  RRR, No M/R/G. Telemetry sinus rhythm. On 8/9 AM: 5 beat nonsustained-idioventricular rhythm versus NSVT Respiratory:  Poor inspiratory effort. Diminished breath sounds in the right base with occasional crackles. No pleural rub. Rest of lung fields clear to auscultation. Gastrointestinal:  Abdomen soft, NT/ND, + BS CNS: Alert & Oriented. No focal deficits. Extremities:  No C/E/C. Symmetric 5 x 5 power.    Data Reviewed:    Labs: Basic Metabolic Panel:  Recent Labs Lab 11/19/13 2039 11/21/13 0337  NA 138  138  K 4.1 3.7  CL 100 102  CO2 24 24  GLUCOSE 196* 140*  BUN 12 9  CREATININE 1.17 1.19  CALCIUM 9.0 8.1*   GFR Estimated Creatinine Clearance: 46.9 ml/min (by C-G formula based on Cr of 1.19). Liver Function Tests:  Recent Labs Lab 11/19/13 2039  AST 9  ALT 6  ALKPHOS 84  BILITOT 1.7*  PROT 7.0  ALBUMIN 2.7*   No results found for this basename: LIPASE, AMYLASE,  in the last 168 hours No results found for this basename: AMMONIA,  in the last 168 hours Coagulation profile  Recent Labs Lab 11/19/13 2354 11/20/13 0450 11/21/13 0337 11/22/13 0435 11/22/13 0714  INR 1.71* 1.78* 2.35* 10.13* 7.73*    CBC:  Recent Labs Lab 11/19/13 2039 11/21/13 0337  WBC 12.2* 10.8*  NEUTROABS 10.6*  --   HGB 13.5 10.9*  HCT 39.8 33.3*  MCV 93.2 94.1  PLT 173 172   Cardiac Enzymes: No results found for this basename: CKTOTAL, CKMB, CKMBINDEX, TROPONINI,  in the last 168 hours BNP (last 3 results) No results found for this basename: PROBNP,  in the last 8760 hours CBG:  Recent Labs Lab 11/21/13 1138 11/21/13 1647 11/21/13 2117 11/22/13 0811 11/22/13 1200  GLUCAP 162* 116* 163* 85 152*   D-Dimer:  Recent Labs  11/20/13 1555  DDIMER 5.75*   Hgb A1c: No results found for this basename: HGBA1C,  in the last 72 hours Lipid Profile: No results found for this basename: CHOL, HDL, LDLCALC, TRIG, CHOLHDL, LDLDIRECT,  in the last 72 hours Thyroid function studies: No results found for this basename: TSH, T4TOTAL, FREET3, T3FREE, THYROIDAB,  in the last 72 hours Anemia work up: No results found for this basename: VITAMINB12, FOLATE, FERRITIN, TIBC, IRON, RETICCTPCT,  in the last 72 hours Sepsis Labs:  Recent Labs Lab 11/19/13 2039 11/20/13 0002 11/21/13 0337  WBC 12.2*  --  10.8*  LATICACIDVEN  --  1.23  --    Microbiology Recent Results (from the past 240 hour(s))  URINE CULTURE     Status: None   Collection Time    11/19/13  9:08 PM      Result Value  Ref Range Status   Specimen Description URINE, RANDOM   Final   Special Requests NONE   Final   Culture  Setup Time     Final   Value: 11/20/2013 11:27     Performed at [facility]     Final   Value: 10,000 COLONIES/ML     Performed at [facility]   Culture     Final   Value: Multiple bacterial morphotypes present, none predominant. Suggest appropriate recollection if clinically indicated.     Performed at [facility]   Report Status 11/21/2013 FINAL   Final  CULTURE, BLOOD (ROUTINE X 2)     Status: None   Collection Time  11/19/13 11:45 PM      Result Value Ref Range Status   Specimen Description BLOOD RIGHT ARM   Final   Special Requests BOTTLES DRAWN AEROBIC AND ANAEROBIC 10CC   Final   Culture  Setup Time     Final   Value: 11/20/2013 03:06     Performed at [facility]   Culture     Final   Value:        BLOOD CULTURE RECEIVED NO GROWTH TO DATE CULTURE WILL BE HELD FOR 5 DAYS BEFORE ISSUING A FINAL NEGATIVE REPORT     Performed at [facility]   Report Status PENDING   Incomplete  CULTURE, BLOOD (ROUTINE X 2)     Status: None   Collection Time    11/19/13 11:55 PM      Result Value Ref Range Status   Specimen Description BLOOD LEFT ARM   Final   Special Requests BOTTLES DRAWN AEROBIC ONLY 10CC   Final   Culture  Setup Time     Final   Value: 11/20/2013 03:06     Performed at [facility]   Culture     Final   Value:        BLOOD CULTURE RECEIVED NO GROWTH TO DATE CULTURE WILL BE HELD FOR 5 DAYS BEFORE ISSUING A FINAL NEGATIVE REPORT     Performed at [facility]   Report Status PENDING   Incomplete     Medications:   . antiseptic oral rinse  7 mL Mouth Rinse BID  . carvedilol  3.125 mg Oral Daily  . donepezil  5 mg Oral QHS  . insulin aspart  0-15 Units Subcutaneous TID WC  . latanoprost  1 drop Both Eyes QHS  . lisinopril  2.5 mg Oral q morning - 10a  . Rivaroxaban  15 mg Oral BID WC    . [START ON 12/13/2013] rivaroxaban  20 mg Oral Q supper   Continuous Infusions:     Time spent: 30 minutes.   LOS: 3 days   Annjanette Wertenberger, MD, FACP, FHM. [facility] Pager [phone]  If 7PM-7AM, please contact night-coverage [email or web address] Password [email or web address] 11/22/2013, 4:08 PM     **Disclaimer: This note was dictated with voice recognition software. Similar sounding words can inadvertently be transcribed and this note may contain transcription errors which may not have been corrected upon publication of note.**

## 2013-11-22 NOTE — Progress Notes (Signed)
Attempted numerous times to convince pt to walk with Probation officer. First attempt, pt stated he would walk after he ate his lunch; pt refused to eat lunch until around 1600. Asked pt if we could walk after he ate his late lunch and pt agreed; went in after pt was finished eating and pt stated he was not going to walk today and "I may decide to walk tomorrow but not today." Pt states he is not hungry and does not plan on eating his dinner; will hold sliding scale insulin for dinner dose.

## 2013-11-22 NOTE — Progress Notes (Signed)
Medicare Important Message given?  YES (If response is "NO", the following Medicare IM given date fields will be blank) Date Medicare IM given:  11/22/2013 Medicare IM given by:  [facility]

## 2013-11-23 ENCOUNTER — Other Ambulatory Visit: Payer: Self-pay | Admitting: Cardiology

## 2013-11-23 LAB — GLUCOSE, CAPILLARY
GLUCOSE-CAPILLARY: 101 mg/dL — AB (ref 70–99)
GLUCOSE-CAPILLARY: 136 mg/dL — AB (ref 70–99)

## 2013-11-23 LAB — CBC
HCT: 33.8 % — ABNORMAL LOW (ref 39.0–52.0)
HEMOGLOBIN: 11.1 g/dL — AB (ref 13.0–17.0)
MCH: 30.8 pg (ref 26.0–34.0)
MCHC: 32.8 g/dL (ref 30.0–36.0)
MCV: 93.9 fL (ref 78.0–100.0)
PLATELETS: 238 10*3/uL (ref 150–400)
RBC: 3.6 MIL/uL — ABNORMAL LOW (ref 4.22–5.81)
RDW: 13.8 % (ref 11.5–15.5)
WBC: 7.7 10*3/uL (ref 4.0–10.5)

## 2013-11-23 MED ORDER — RIVAROXABAN 20 MG PO TABS: 20.0000 mg | ORAL_TABLET | Freq: Every day | ORAL | Status: DC

## 2013-11-23 MED ORDER — ACETAMINOPHEN 325 MG PO TABS: 650.0000 mg | ORAL_TABLET | Freq: Four times a day (QID) | ORAL | Status: DC | PRN

## 2013-11-23 MED ORDER — RIVAROXABAN 15 MG PO TABS: 15.0000 mg | ORAL_TABLET | Freq: Two times a day (BID) | ORAL | Status: DC

## 2013-11-23 NOTE — Care Management Note (Signed)
CARE MANAGEMENT NOTE 11/23/2013  Patient:  Grant Lara, Grant Lara   Account Number:  [id number]  Date Initiated:  11/21/2013  Documentation initiated by:  [facility]  Subjective/Objective Assessment:   adm: hx PE     Action/Plan:   med asst  11/23/13 Ongoing effort to learn pt copay, now part of Rocket/xarelto study so pt may be able to receive some assistance per cardiology PA. Pt wife has 30 day free card.   Anticipated DC Date:  11/23/2013   Anticipated DC Plan:  [place]  CM consult  Medication Assistance      Choice offered to / List presented to:             Status of service:  Completed, signed off Medicare Important Message given?  YES (If response is "NO", the following Medicare IM given date fields will be blank) Date Medicare IM given:  11/22/2013 Medicare IM given by:  [facility] Date Additional Medicare IM given:   Additional Medicare IM given by:    Discharge Disposition:  HOME/SELF CARE  Per UR Regulation:  Reviewed for med. necessity/level of care/duration of stay  If discussed at [place] of Stay Meetings, dates discussed:    Comments:  11/22/2013 12:48 PM  UR completed. Jonnie Finner RN CCM Case Mgmt phone [phone]    11/21/13 08:00 Cm received call from MD to see if pt's pharmacy could do a cost run for Xarelto and Eiiquis and to check affordability with pt.  CM spoke with pt who stated he uses the [facility] on [address].  CM called pharmacy who stated they do not run scripts for cost as they cannot incur that cost, however pharmacy states Xarelto may be the more affordable choice as pt might qualify for asst program.  Pharmacist gave a ballpark range of over $20 per month however pt states this would be a hardship.  MD made aware.  CM will follow tomorrow to explore asst programs and this CM gave pt apre-activated  free 30 day trial card. CM will follow this pt. Mariane Masters, BSN, CM [phone].

## 2013-11-23 NOTE — Progress Notes (Signed)
Patient ID: THELMA VIANA, male   DOB: 11-23-1940, 73 y.o.   MRN: [id number]      Patient: Grant Lara / Admit Date: 11/19/2013 / Date of Encounter: 11/23/2013, 10:16 AM   Subjective: Feeling much better today. No chest pain, palpitations, or SOB. He does mention some left foot pain that developed overnight. This is a longstanding issue 2/2 arthritis.    Objective: Telemetry: NSR, HR 60-70, couple PVCs  Physical Exam: Blood pressure 140/75, pulse 68, temperature 98.2 F (36.8 C), temperature source Oral, resp. rate 18, height 5' 9"  (1.753 m), weight 136 lb 3.2 oz (61.78 kg), SpO2 99.00%. General: Well developed, well nourished, in no acute distress. Head: Normocephalic, atraumatic, sclera non-icteric, no xanthomas, nares are without discharge. Neck: Negative for carotid bruits. JVP not elevated. Lungs: Clear bilaterally to auscultation without wheezes, rales, or rhonchi. Breathing is unlabored. Heart: RRR S1 S2 without murmurs, rubs, or gallops.  Abdomen: Soft, non-tender, non-distended with normoactive bowel sounds. No rebound/guarding. Extremities: No clubbing or cyanosis. No edema. Distal pedal pulses are 2+ and equal bilaterally. Neuro: Alert and oriented X 3. Moves all extremities spontaneously. Psych:  Responds to questions appropriately with a normal affect.   Intake/Output Summary (Last 24 hours) at 11/23/13 1016 Last data filed at 11/23/13 0900  Gross per 24 hour  Intake    480 ml  Output    700 ml  Net   -220 ml    Inpatient Medications:  . antiseptic oral rinse  7 mL Mouth Rinse BID  . carvedilol  3.125 mg Oral Daily  . donepezil  5 mg Oral QHS  . insulin aspart  0-15 Units Subcutaneous TID WC  . latanoprost  1 drop Both Eyes QHS  . lisinopril  2.5 mg Oral q morning - 10a  . Rivaroxaban  15 mg Oral BID WC  . [START ON 12/13/2013] rivaroxaban  20 mg Oral Q supper   Infusions:    Labs:  Recent Labs  11/21/13 0337  NA 138  K 3.7  CL 102  CO2 24  GLUCOSE 140*   BUN 9  CREATININE 1.19  CALCIUM 8.1*   No results found for this basename: AST, ALT, ALKPHOS, BILITOT, PROT, ALBUMIN,  in the last 72 hours  Recent Labs  11/22/13 1705 11/23/13 0325  WBC 9.0 7.7  HGB 11.0* 11.1*  HCT 33.8* 33.8*  MCV 94.7 93.9  PLT 230 238   No results found for this basename: CKTOTAL, CKMB, TROPONINI,  in the last 72 hours No components found with this basename: POCBNP,  No results found for this basename: HGBA1C,  in the last 72 hours   Radiology/Studies:  Dg Chest 2 View  11/20/2013   CLINICAL DATA:  Right flank pain  EXAM: CHEST  2 VIEW  COMPARISON:  CT scan 11/05/2011 and chest radiograph 11/04/2011  FINDINGS: There ismild cardiac enlargement. Vascular pattern is normal. Left lung is clear. On the right, there is consolidation in the superior segment of the left lower lobe. There is no pneumothorax. There is no significant pleural effusion.  IMPRESSION: Consolidation in the superior segment of the right lower lobe consistent with pneumonia. Radiographic followup after appropriate therapy is recommended to ensure resolution.   Electronically Signed   By: Skipper Cliche M.D.   On: 11/20/2013 01:04   Ct Angio Chest Pe W/cm &/or Wo Cm  11/20/2013   CLINICAL DATA:  Right-sided pain with breathing, history of pulmonary embolism and deep venous thrombosis  EXAM: CT ANGIOGRAPHY  CHEST WITH CONTRAST  TECHNIQUE: Multidetector CT imaging of the chest was performed using the standard protocol during bolus administration of intravenous contrast. Multiplanar CT image reconstructions and MIPs were obtained to evaluate the vascular anatomy.  CONTRAST:  124m OMNIPAQUE IOHEXOL 350 MG/ML SOLN  COMPARISON:  11/05/2011  FINDINGS: There is filling defect centrally within the proximal right lower lobe pulmonary artery, causing near complete occlusion of lower lobe branches. There is a small loculated pleural effusion in the region of the right lower lobe and there is extensive consolidation  throughout the right lower lobe with air bronchograms. Left pulmonary arterial system is clear. Right upper lobe and middle lobe arteries are clear as well. Ratio of right ventricle to left ventricle is 0.74, not consistent with significant right heart strain. The left lung is clear. There is filling defect in left lower lobe bronchus and branches. This suggests mucous plugging.  No evidence of aortic dilatation. There is coronary arterial calcification with mild cardiomegaly.  There are partially visualized right renal cysts as seen on prior studies. There are no acute musculoskeletal findings.  Review of the MIP images confirms the above findings.  IMPRESSION: Acute extensive obstructing right lower lobe pulmonary embolism with evidence of pulmonary infarction. Small loculated right pleural effusion also identified. Findings do not suggest right heart strain. Critical Value/emergent results were called by telephone at the time of interpretation on 11/20/2013 at 7:27 pm to Dr. AVernell Leep, who verbally acknowledged these results.   Electronically Signed   By: RSkipper ClicheM.D.   On: 11/20/2013 19:27     Assessment and Plan   Problem list:  1. Chronic systolic CHF 2. Pulmonary emboli 3. HTN  73year old male with PMHx of chronic systolic CHF, chronic DVT with pulmonary emboli who was admitted on 11/19/2013 with hemoptysis, pleuritic chest pain, and found to have a decreased EF compared to previous several years ago.  1. Chronic systolic CHF -Known systolic CHF -HR and BP fairly-well controlled. He must limit the salt intake.  -He does not appear to markedly fluid overloaded at this time.  2. Pulmonary emboli -Changed to Xarelto which may actually work better for him as discussed previously.  -? Of cost in the future  3. HTN -Stable  4. Tele review - Review of tele reveals several episodes of PVCs/ventricular bigeminy, from 11/22/2013  5. Foot pain -History of arthritis  -Longstanding  issue   Signed, RChristell Faith PA-C 11/23/2013 10:42 AM   Attending Note:   The patient was seen and examined.  Agree with assessment and plan as noted above.  Changes made to the above note as needed.  He is very stable. No clinical evidence of worsening CHF.  I would suggest a NOAC instead of coumadin.   He was not theraputic on several of his last INRs.    Will sign off.  Call for questions.  He will  follow up with Dr. HPercival Spanish   PThayer Headings JBrooke Bonito, MD, FY[facility]8/02/2014, 1:42 PM [address]  SFallstonPager [phone]

## 2013-11-23 NOTE — Progress Notes (Signed)
Received COPD GOLD referral for [facility] Care Management services. Met with patient at bedside who is agreeable and consent were signed for [facility]. He will receive post hospital discharge call and will be evaluated for monthly home visits. Reports he lives with his wife. Will make inpatient RNCM aware that [facility] will follow.  Marthenia Rolling, [facility] [phone]

## 2013-11-23 NOTE — Care Management Note (Signed)
CARE MANAGEMENT NOTE 11/23/2013  Patient:  Grant Lara, Grant Lara   Account Number:  [id number]  Date Initiated:  11/21/2013  Documentation initiated by:  [facility]  Subjective/Objective Assessment:   adm: hx PE     Action/Plan:   med asst  11/23/13 Ongoing effort to learn pt copay, now part of Rocket/xarelto study so pt may be able to receive some assistance per cardiology PA. Pt wife has 30 day free card.   Anticipated DC Date:  11/23/2013   Anticipated DC Plan:  Page  CM consult  Medication Assistance      Choice offered to / List presented to:             Status of service:  Completed, signed off Medicare Important Message given?  YES (If response is "NO", the following Medicare IM given date fields will be blank) Date Medicare IM given:  11/22/2013 Medicare IM given by:  [facility] Date Additional Medicare IM given:   Additional Medicare IM given by:    Discharge Disposition:  HOME/SELF CARE  Per UR Regulation:  Reviewed for med. necessity/level of care/duration of stay  If discussed at [place] of Stay Meetings, dates discussed:    Comments:    11/23/2013 Ongoing efforts to obtain copay for Xarelto from pt insurance provider, [employer] Medicare Complete, unable to provide info on 11/22/13 due to computers being "down" and unable to provide info today 11/23/13 as "profile not up to date". Per weekend case manager pt pharmacy unable to provide cost without running the prescription. This CM discussed with pt and wife the use of 30 day free card and the plan to discuss with cardiology at next visit if cost is prohabitive . Cardiology may be able to assist as pt is part of a Xarelto study. Jasmine Pang RN MPH, case manager, [phone]  11/22/2013 12:48 PM  UR completed. Jonnie Finner RN CCM Case Mgmt phone [phone]    11/21/13 08:00 Cm received call from MD to see if pt's pharmacy could do a cost run for Xarelto and Eiiquis and to check  affordability with pt.  CM spoke with pt who stated he uses the [facility] on [address].  CM called pharmacy who stated they do not run scripts for cost as they cannot incur that cost, however pharmacy states Xarelto may be the more affordable choice as pt might qualify for asst program.  Pharmacist gave a ballpark range of over $20 per month however pt states this would be a hardship.  MD made aware.  CM will follow tomorrow to explore asst programs and this CM gave pt apre-activated  free 30 day trial card. CM will follow this pt. Mariane Masters, BSN, CM [phone].

## 2013-11-23 NOTE — Discharge Summary (Signed)
Physician Discharge Summary  Grant Lara BEE:[id number] DOB: 02-24-1941 DOA: 11/19/2013  PCP: Grant Logan, MD  Admit date: 11/19/2013 Discharge date: 11/23/2013  Time spent: Greater than 30 minutes  Recommendations for Outpatient Follow-up:  1. Dr. Donald Lara. PCP in 3 days-to be seen with repeat labs (CBC). Please follow final blood culture results that were sent from the hospital. 2. Dr. Minus Lara, Cardiology  Discharge Diagnoses:  Principal Problem:   Acute pulmonary embolism with pulmonary infarction. Active Problems:   TOBACCO ABUSE   Essential hypertension, benign   Chronic systolic heart failure   DM2 (diabetes mellitus, type 2)   Leukocytosis   Anemia, unspecified   Discharge Condition: Improved & Stable  Diet recommendation: Heart Healthy & Diabetic diet.  Filed Weights   11/20/13 2124 11/21/13 2119 11/22/13 2055  Weight: 60.963 kg (134 lb 6.4 oz) 61.78 kg (136 lb 3.2 oz) 61.78 kg (136 lb 3.2 oz)    History of present illness:  Grant Lara is an 73 y.o. male with history of hypertension, dilated cardiomyopathy LVEF 40-45% (05/26/12), CAD, DVT/PE on Coumadin, HLD, CVA, ongoing tobacco abuse presented to the ED with 2-3 day history of sharp RUQ abdominal/ R lower chest pain. It is worse with movement and breathing. Not really SOB but states he does have a cough productive of bloody sputum that started just before he came over to the ED. Denies fever or sickly contacts. Claims compliance to Coumadin. Initially treated for CAP but CTA chest shows obstructing RLL PE with pulmonary infarction  Hospital Course:   Principal Problem:  Acute extensive obstructing RLL PE with pulmonary infarction/prior h/o VTE  - Patient was initially treated with antibiotics for presumed CAP. However his picture was concerning for PE (no productive cough, no high fevers, subtherapeutic INR on admission in patient with prior PE). CTA chest confirmed PE.  - Initially treated with full dose  Lovenox and Coumadin continued.  - However on careful review, it appears that despite compliance to Coumadin, patient has had several subtherapeutic range INRs in April May and July. Thereby switched to Xarelto on 11/21/13.   - DC'ed Abx.  - Echo: No right heart strain.   Active Problems:  Essential hypertension, benign  - Continue lisinopril and carvedilol. Controlled  Chronic systolic heart failure/dilated cardiomyopathy/CAD  - Compensated.  - Repeat 2-D echo shows worsening LVEF 25-30% and diffuse hypokinesis. Continue lisinopril and carvedilol. Cardiology consulted-no additional recommendations.  - Tele 8/9 AM: 5 beats of Idioventricular rhythm ? NSVT -no further episodes DM2 (diabetes mellitus, type 2)  - Reasonably controlled. Treated with SSI in the hospital. Resume oral hypoglycemics at discharge. Leukocytosis  - May be secondary to stress response. Resolved  Tobacco abuse  - Cessation counseled. Patient declines nicotine patch.  Anemia  - Mild hemoptysis does not explain the drop in hemoglobin from 13.5 > 10.9. Hemoptysis resolved. Hb stable.     Consultations:  Cardiology  Procedures:  2-D echo 11/22/13: Study Conclusions  - Left ventricle: The cavity size was mildly dilated. Wall thickness was normal. Systolic function was severely reduced. The estimated ejection fraction was in the range of 25% to 30%. Diffuse hypokinesis. Doppler parameters are consistent with abnormal left ventricular relaxation (grade 1 diastolic dysfunction). - Aortic valve: There was mild regurgitation. - Mitral valve: There was mild regurgitation. - Pericardium, extracardiac: A trivial pericardial effusion was identified.  Impressions:  - Severe global reduction in LV function; grade 1 diastolic dysfunction; mild AI and MR.  Discharge Exam:  Complaints:  Feels much better. Chest pain continues to improve. Has not used much pain medications. No further hemoptysis for the last 3  days. Denies dyspnea.  Filed Vitals:   11/22/13 1104 11/22/13 2055 11/23/13 0500 11/23/13 1003  BP: 124/76 130/61 134/66 140/75  Pulse: 59 60 60 68  Temp: 98.3 F (36.8 C) 98.9 F (37.2 C) 98.8 F (37.1 C) 98.2 F (36.8 C)  TempSrc: Oral Oral Oral Oral  Resp: 17 17 18 18   Height:      Weight:  61.78 kg (136 lb 3.2 oz)    SpO2: 94% 98% 99% 99%    Gen: NAD. Elderly male sitting up comfortably in bed.  Cardiovascular: RRR, No M/R/G. Telemetry sinus rhythm - SB in 50's. No further arrhythmias.  Respiratory: Poor inspiratory effort. Diminished breath sounds in the right base with occasional crackles. No pleural rub. Rest of lung fields clear to auscultation.  Gastrointestinal: Abdomen soft, NT/ND, + BS  CNS: Alert & Oriented. No focal deficits.  Extremities: No C/E/C. Symmetric 5 x 5 power.    Discharge Instructions      Discharge Instructions   (HEART FAILURE PATIENTS) Call MD:  Anytime you have any of the following symptoms: 1) 3 pound weight gain in 24 hours or 5 pounds in 1 week 2) shortness of breath, with or without a dry hacking cough 3) swelling in the hands, feet or stomach 4) if you have to sleep on extra pillows at night in order to breathe.    Complete by:  As directed      Call MD for:  difficulty breathing, headache or visual disturbances    Complete by:  As directed      Call MD for:  severe uncontrolled pain    Complete by:  As directed      Call MD for:    Complete by:  As directed   Worsening coughing blood.     Diet - low sodium heart healthy    Complete by:  As directed      Diet Carb Modified    Complete by:  As directed      Increase activity slowly    Complete by:  As directed             Medication List    STOP taking these medications       warfarin 5 MG tablet  Commonly known as:  COUMADIN      TAKE these medications       acetaminophen 325 MG tablet  Commonly known as:  TYLENOL  Take 2 tablets (650 mg total) by mouth every 6 (six)  hours as needed for mild pain, moderate pain or fever.     carvedilol 3.125 MG tablet  Commonly known as:  COREG  Take 3.125 mg by mouth daily.     donepezil 5 MG tablet  Commonly known as:  ARICEPT  Take 5 mg by mouth at bedtime.     glipiZIDE 5 MG tablet  Commonly known as:  GLUCOTROL  Take 1 tablet (5 mg total) by mouth daily before breakfast.     glucose monitoring kit monitoring kit  - 1 each by Does not apply route as needed for other. Please give 100 lancets and testing strips.   - Check sugars every morning before breakfast and every evening before dinner     latanoprost 0.005 % ophthalmic solution  Commonly known as:  XALATAN  Place 1 drop into both eyes  at bedtime.     lisinopril 5 MG tablet  Commonly known as:  PRINIVIL,ZESTRIL  Take 2.5 mg by mouth every morning.     Rivaroxaban 15 MG Tabs tablet  Commonly known as:  XARELTO  Take 1 tablet (15 mg total) by mouth 2 (two) times daily with a meal.     rivaroxaban 20 MG Tabs tablet  Commonly known as:  XARELTO  Take 1 tablet (20 mg total) by mouth daily with supper. First dose on Mon 12/13/13 at 1700  Start taking on:  12/13/2013       Follow-up Information   Follow up with Grant Logan, MD. Schedule an appointment as soon as possible for a visit in 3 days. ([place] hospital discharge follow up.)    Specialty:  Family Medicine   Contact information:   [address], [place] [address] [phone]       Schedule an appointment as soon as possible for a visit with Grant Breeding, MD.   Specialty:  Cardiology   Contact information:   [address] [place] [address] [phone]        The results of significant diagnostics from this hospitalization (including imaging, microbiology, ancillary and laboratory) are listed below for reference.    Significant Diagnostic Studies: Dg Chest 2 View  11/20/2013   CLINICAL DATA:  Right flank pain  EXAM: CHEST  2 VIEW  COMPARISON:  CT  scan 11/05/2011 and chest radiograph 11/04/2011  FINDINGS: There ismild cardiac enlargement. Vascular pattern is normal. Left lung is clear. On the right, there is consolidation in the superior segment of the left lower lobe. There is no pneumothorax. There is no significant pleural effusion.  IMPRESSION: Consolidation in the superior segment of the right lower lobe consistent with pneumonia. Radiographic followup after appropriate therapy is recommended to ensure resolution.   Electronically Signed   By: Skipper Cliche M.D.   On: 11/20/2013 01:04   Ct Angio Chest Pe W/cm &/or Wo Cm  11/20/2013   CLINICAL DATA:  Right-sided pain with breathing, history of pulmonary embolism and deep venous thrombosis  EXAM: CT ANGIOGRAPHY CHEST WITH CONTRAST  TECHNIQUE: Multidetector CT imaging of the chest was performed using the standard protocol during bolus administration of intravenous contrast. Multiplanar CT image reconstructions and MIPs were obtained to evaluate the vascular anatomy.  CONTRAST:  153m OMNIPAQUE IOHEXOL 350 MG/ML SOLN  COMPARISON:  11/05/2011  FINDINGS: There is filling defect centrally within the proximal right lower lobe pulmonary artery, causing near complete occlusion of lower lobe branches. There is a small loculated pleural effusion in the region of the right lower lobe and there is extensive consolidation throughout the right lower lobe with air bronchograms. Left pulmonary arterial system is clear. Right upper lobe and middle lobe arteries are clear as well. Ratio of right ventricle to left ventricle is 0.74, not consistent with significant right heart strain. The left lung is clear. There is filling defect in left lower lobe bronchus and branches. This suggests mucous plugging.  No evidence of aortic dilatation. There is coronary arterial calcification with mild cardiomegaly.  There are partially visualized right renal cysts as seen on prior studies. There are no acute musculoskeletal findings.   Review of the MIP images confirms the above findings.  IMPRESSION: Acute extensive obstructing right lower lobe pulmonary embolism with evidence of pulmonary infarction. Small loculated right pleural effusion also identified. Findings do not suggest right heart strain. Critical Value/emergent results were called by  telephone at the time of interpretation on 11/20/2013 at 7:27 pm to Dr. Vernell Leep , who verbally acknowledged these results.   Electronically Signed   By: Skipper Cliche M.D.   On: 11/20/2013 19:27    Microbiology: Recent Results (from the past 240 hour(s))  URINE CULTURE     Status: None   Collection Time    11/19/13  9:08 PM      Result Value Ref Range Status   Specimen Description URINE, RANDOM   Final   Special Requests NONE   Final   Culture  Setup Time     Final   Value: 11/20/2013 11:27     Performed at [facility]     Final   Value: 10,000 COLONIES/ML     Performed at [facility]   Culture     Final   Value: Multiple bacterial morphotypes present, none predominant. Suggest appropriate recollection if clinically indicated.     Performed at [facility]   Report Status 11/21/2013 FINAL   Final  CULTURE, BLOOD (ROUTINE X 2)     Status: None   Collection Time    11/19/13 11:45 PM      Result Value Ref Range Status   Specimen Description BLOOD RIGHT ARM   Final   Special Requests BOTTLES DRAWN AEROBIC AND ANAEROBIC 10CC   Final   Culture  Setup Time     Final   Value: 11/20/2013 03:06     Performed at [facility]   Culture     Final   Value:        BLOOD CULTURE RECEIVED NO GROWTH TO DATE CULTURE WILL BE HELD FOR 5 DAYS BEFORE ISSUING A FINAL NEGATIVE REPORT     Performed at [facility]   Report Status PENDING   Incomplete  CULTURE, BLOOD (ROUTINE X 2)     Status: None   Collection Time    11/19/13 11:55 PM      Result Value Ref Range Status   Specimen Description BLOOD LEFT ARM   Final   Special  Requests BOTTLES DRAWN AEROBIC ONLY 10CC   Final   Culture  Setup Time     Final   Value: 11/20/2013 03:06     Performed at [facility]   Culture     Final   Value:        BLOOD CULTURE RECEIVED NO GROWTH TO DATE CULTURE WILL BE HELD FOR 5 DAYS BEFORE ISSUING A FINAL NEGATIVE REPORT     Performed at [facility]   Report Status PENDING   Incomplete     Labs: Basic Metabolic Panel:  Recent Labs Lab 11/19/13 2039 11/21/13 0337  NA 138 138  K 4.1 3.7  CL 100 102  CO2 24 24  GLUCOSE 196* 140*  BUN 12 9  CREATININE 1.17 1.19  CALCIUM 9.0 8.1*   Liver Function Tests:  Recent Labs Lab 11/19/13 2039  AST 9  ALT 6  ALKPHOS 84  BILITOT 1.7*  PROT 7.0  ALBUMIN 2.7*   No results found for this basename: LIPASE, AMYLASE,  in the last 168 hours No results found for this basename: AMMONIA,  in the last 168 hours CBC:  Recent Labs Lab 11/19/13 2039 11/21/13 0337 11/22/13 1705 11/23/13 0325  WBC 12.2* 10.8* 9.0 7.7  NEUTROABS 10.6*  --   --   --   HGB 13.5 10.9* 11.0* 11.1*  HCT 39.8  33.3* 33.8* 33.8*  MCV 93.2 94.1 94.7 93.9  PLT 173 172 230 238   Cardiac Enzymes: No results found for this basename: CKTOTAL, CKMB, CKMBINDEX, TROPONINI,  in the last 168 hours BNP: BNP (last 3 results) No results found for this basename: PROBNP,  in the last 8760 hours CBG:  Recent Labs Lab 11/22/13 1200 11/22/13 1736 11/22/13 2058 11/23/13 0755 11/23/13 1213  GLUCAP 152* 179* 123* 101* 136*      Signed:  Vernell Leep, MD, FACP, FHM. [facility] Pager [phone]  If 7PM-7AM, please contact night-coverage [email or web address] Password [email or web address] 11/23/2013, 2:28 PM

## 2013-11-26 LAB — CULTURE, BLOOD (ROUTINE X 2)
CULTURE: NO GROWTH
CULTURE: NO GROWTH

## 2014-02-07 ENCOUNTER — Other Ambulatory Visit: Payer: Self-pay | Admitting: *Deleted

## 2014-02-07 ENCOUNTER — Ambulatory Visit (INDEPENDENT_AMBULATORY_CARE_PROVIDER_SITE_OTHER): Payer: Medicare Other | Admitting: Cardiology

## 2014-02-07 ENCOUNTER — Encounter: Payer: Self-pay | Admitting: Cardiology

## 2014-02-07 VITALS — BP 132/70 | HR 53 | Ht 69.0 in | Wt 137.0 lb

## 2014-02-07 DIAGNOSIS — R531 Weakness: Secondary | ICD-10-CM

## 2014-02-07 DIAGNOSIS — R319 Hematuria, unspecified: Secondary | ICD-10-CM

## 2014-02-07 DIAGNOSIS — I5022 Chronic systolic (congestive) heart failure: Secondary | ICD-10-CM

## 2014-02-07 DIAGNOSIS — I2699 Other pulmonary embolism without acute cor pulmonale: Secondary | ICD-10-CM

## 2014-02-07 LAB — CBC
HCT: 40.7 % (ref 39.0–52.0)
Hemoglobin: 13.5 g/dL (ref 13.0–17.0)
MCH: 30.3 pg (ref 26.0–34.0)
MCHC: 33.2 g/dL (ref 30.0–36.0)
MCV: 91.3 fL (ref 78.0–100.0)
Platelets: 180 10*3/uL (ref 150–400)
RBC: 4.46 MIL/uL (ref 4.22–5.81)
RDW: 14.8 % (ref 11.5–15.5)
WBC: 5.5 10*3/uL (ref 4.0–10.5)

## 2014-02-07 MED ORDER — LISINOPRIL 2.5 MG PO TABS: 2.5000 mg | ORAL_TABLET | Freq: Every morning | ORAL | Status: DC

## 2014-02-07 MED ORDER — CARVEDILOL 3.125 MG PO TABS: 3.1250 mg | ORAL_TABLET | Freq: Every day | ORAL | Status: DC

## 2014-02-07 NOTE — Progress Notes (Addendum)
HPI The patient presents for follow up after a hospitalization for pulmonary embolism.  I have reviewed these records.  Of note he was started on Xarelto as it was felt that difficulty maintaining a therapeutic INR was an issue related to his presentation.  In addition, he was noted to have an EF that was lower than previous.  He reports that over the past several days he has noted blood in his urine. He denies any dysuria.  He has otherwise being doing OK.  The patient denies any new symptoms such as chest discomfort, neck or arm discomfort. There has been no new shortness of breath, PND or orthopnea. There have been no reported palpitations, presyncope or syncope.   No Known Allergies  Current Outpatient Prescriptions  Medication Sig Dispense Refill  . acetaminophen (TYLENOL) 325 MG tablet Take 2 tablets (650 mg total) by mouth every 6 (six) hours as needed for mild pain, moderate pain or fever.      . carvedilol (COREG) 3.125 MG tablet Take 1 tablet (3.125 mg total) by mouth daily.  30 tablet  0  . donepezil (ARICEPT) 5 MG tablet Take 5 mg by mouth at bedtime.      [name] glipiZIDE (GLUCOTROL) 5 MG tablet Take 1 tablet (5 mg total) by mouth daily before breakfast.  30 tablet  0  . glucose monitoring kit (FREESTYLE) monitoring kit 1 each by Does not apply route as needed for other. Please give 100 lancets and testing strips.  Check sugars every morning before breakfast and every evening before dinner  1 each  0  . latanoprost (XALATAN) 0.005 % ophthalmic solution Place 1 drop into both eyes at bedtime.      [name] lisinopril (PRINIVIL,ZESTRIL) 5 MG tablet Take 2.5 mg by mouth every morning.      . rivaroxaban (XARELTO) 20 MG TABS tablet Take 1 tablet (20 mg total) by mouth daily with supper. First dose on Mon 12/13/13 at 1700  30 tablet  0   No current facility-administered medications for this visit.    Past Medical History  Diagnosis Date  . Hypertension   . Dilated cardiomyopathy     2/12: EF  30-35%, trivial AI, mild RAE.  EF 2014 40 -45%  . CAD (coronary artery disease)     LHC 9/05 with Dr. Einar Gip:  dLM 20-30%, LAD 85%, oD1 20-30%.  PCI:  Taxus DES to LAD; Dx jailed and tx with POBA.  Last myoview 12/10: inf scar, no ischemia, EF 29%.  . DVT (deep venous thrombosis)   . Pulmonary embolus     chronic coumadin  . Hyperhomocystinemia   . PFO (patent foramen ovale)     Not mentioned on 2014 echo.  [name] HLD (hyperlipidemia)   . Crohn's disease   . Nephrolithiasis   . Diabetes mellitus     11/05/11 "borderline; don't take medications"  . Stroke 1993    "left arm can't hold steady; leg too"  . Arthritis     "used to have a touch in my legs"  . Memory difficulties   . Pneumonia ~ 2011    09-22-13 denies any recent SOB or breathing problems  . Swelling of both ankles      09-22-13 occ.feet, but denies pain.    Past Surgical History  Procedure Laterality Date  . Colon surgery  1994; 1996    "for Crohn's disease"  . Appendectomy      ROS:  As stated in the HPI and negative for  all other systems.  PHYSICAL EXAM BP 132/70  Pulse 53  Ht 5' 9"  (1.753 m)  Wt 137 lb (62.143 kg)  BMI 20.22 kg/m2 GENERAL:  Well appearing NECK:  No jugular venous distention, waveform within normal limits, carotid upstroke brisk and symmetric, no bruits, no thyromegaly LUNGS:  Clear to auscultation bilaterally BACK:  No CVA tenderness CHEST:  Unremarkable HEART:  PMI not displaced or sustained,S1 and S2 within normal limits, no S3, no S4, no clicks, no rubs, no murmurs ABD:  Flat, positive bowel sounds normal in frequency in pitch, no bruits, no rebound, no guarding, no midline pulsatile mass, no hepatomegaly, no splenomegaly EXT:  2 plus pulses throughout, mild ankle edema bilateral, no cyanosis no clubbing   EKG:  Sinus rhythm, rate 53, premature atrial contractions, left into the hypertrophy by voltage, short PR interval, nonspecific T-wave flattening.   02/07/2014  ASSESSMENT AND  PLAN  CHRONIC SYSTOLIC HEART FAILURE -  He seems to be euvolemic. He will continue medical management for his EF that is lower than previous.  I will reassess this in the future with follow up echocardiography.  At this point, no change in therapy is indicated.    CORONARY ARTERY DISEASE S/P LAD DES/PTCA 2005 -  The patient has no new sypmtoms.  No further cardiovascular testing is indicated.  We will continue with aggressive risk reduction and meds as listed.  ESSENTIAL HYPERTENSION, BENIGN -  The blood pressure is at target. No change in medications is indicated. We will continue with therapeutic lifestyle changes (TLC).  Pulmonary embolism -  He will remain now on Xarelto having failed warfarin.  TOBACCO ABUSE -  We have discussed the importance of quitting at previous appts.  He  Is unable to quit.  HEMATURIA - I will check a CBC and UA.  He is to see a urologist.

## 2014-02-07 NOTE — Patient Instructions (Signed)
Your physician recommends that you schedule a follow-up appointment in: 6 months with Dr. Allena Napoleon will need to see your neurologist as soon as you can per Dr. Percival Spanish  We are ordering blood work and a urin test

## 2014-02-08 LAB — URINALYSIS
Bilirubin Urine: NEGATIVE
GLUCOSE, UA: NEGATIVE mg/dL
Ketones, ur: NEGATIVE mg/dL
LEUKOCYTES UA: NEGATIVE
NITRITE: NEGATIVE
PROTEIN: NEGATIVE mg/dL
Specific Gravity, Urine: 1.017 (ref 1.005–1.030)
Urobilinogen, UA: 0.2 mg/dL (ref 0.0–1.0)
pH: 5.5 (ref 5.0–8.0)

## 2014-02-16 NOTE — Progress Notes (Signed)
LMTCB (lab results)

## 2014-02-18 ENCOUNTER — Telehealth: Payer: Self-pay | Admitting: Cardiology

## 2014-02-18 NOTE — Telephone Encounter (Signed)
New message       Want lab results

## 2014-02-18 NOTE — Telephone Encounter (Signed)
Patient notified of lab results

## 2014-03-23 ENCOUNTER — Other Ambulatory Visit: Payer: Self-pay | Admitting: *Deleted

## 2014-03-23 MED ORDER — RIVAROXABAN 20 MG PO TABS: 20.0000 mg | ORAL_TABLET | Freq: Every day | ORAL | Status: DC

## 2014-04-04 ENCOUNTER — Telehealth: Payer: Self-pay | Admitting: Cardiology

## 2014-04-04 NOTE — Telephone Encounter (Signed)
New message      Pt cannot afford xarelto with insurance.  Is there something else he can take

## 2014-04-05 NOTE — Telephone Encounter (Signed)
Pt. Says he can't afford xarelto , what do you want to do

## 2014-04-13 ENCOUNTER — Telehealth: Payer: Self-pay | Admitting: *Deleted

## 2014-04-13 NOTE — Telephone Encounter (Signed)
PA for Xarelto sent via CoverMyMeds

## 2014-04-14 NOTE — Telephone Encounter (Signed)
PA for Xarelto approved through 04/13/2015. UZ#[id number]

## 2014-04-14 NOTE — Telephone Encounter (Signed)
He failed warfarin before.  He can take any of the other NOACs.  He will need to talk to [place].

## 2014-04-15 ENCOUNTER — Inpatient Hospital Stay (HOSPITAL_COMMUNITY)
Admission: EM | Admit: 2014-04-15 | Discharge: 2014-04-19 | DRG: 065 | Disposition: A | Discharge status: Skilled Nursing Facility | Payer: Medicare Other | Attending: Internal Medicine | Admitting: Internal Medicine

## 2014-04-15 ENCOUNTER — Emergency Department (HOSPITAL_COMMUNITY): Payer: Medicare Other

## 2014-04-15 ENCOUNTER — Inpatient Hospital Stay (HOSPITAL_COMMUNITY): Payer: Medicare Other

## 2014-04-15 ENCOUNTER — Encounter (HOSPITAL_COMMUNITY): Payer: Self-pay | Admitting: Radiology

## 2014-04-15 DIAGNOSIS — F17210 Nicotine dependence, cigarettes, uncomplicated: Secondary | ICD-10-CM | POA: Diagnosis present

## 2014-04-15 DIAGNOSIS — R482 Apraxia: Secondary | ICD-10-CM | POA: Diagnosis present

## 2014-04-15 DIAGNOSIS — I6340 Cerebral infarction due to embolism of unspecified cerebral artery: Secondary | ICD-10-CM

## 2014-04-15 DIAGNOSIS — Z86718 Personal history of other venous thrombosis and embolism: Secondary | ICD-10-CM

## 2014-04-15 DIAGNOSIS — E785 Hyperlipidemia, unspecified: Secondary | ICD-10-CM

## 2014-04-15 DIAGNOSIS — N183 Chronic kidney disease, stage 3 unspecified: Secondary | ICD-10-CM | POA: Diagnosis present

## 2014-04-15 DIAGNOSIS — E119 Type 2 diabetes mellitus without complications: Secondary | ICD-10-CM

## 2014-04-15 DIAGNOSIS — Z7901 Long term (current) use of anticoagulants: Secondary | ICD-10-CM

## 2014-04-15 DIAGNOSIS — I5022 Chronic systolic (congestive) heart failure: Secondary | ICD-10-CM | POA: Diagnosis present

## 2014-04-15 DIAGNOSIS — F0390 Unspecified dementia without behavioral disturbance: Secondary | ICD-10-CM | POA: Diagnosis present

## 2014-04-15 DIAGNOSIS — Z9861 Coronary angioplasty status: Secondary | ICD-10-CM

## 2014-04-15 DIAGNOSIS — Z86711 Personal history of pulmonary embolism: Secondary | ICD-10-CM | POA: Diagnosis not present

## 2014-04-15 DIAGNOSIS — I420 Dilated cardiomyopathy: Secondary | ICD-10-CM | POA: Diagnosis present

## 2014-04-15 DIAGNOSIS — G629 Polyneuropathy, unspecified: Secondary | ICD-10-CM | POA: Diagnosis present

## 2014-04-15 DIAGNOSIS — N132 Hydronephrosis with renal and ureteral calculous obstruction: Secondary | ICD-10-CM | POA: Diagnosis present

## 2014-04-15 DIAGNOSIS — I517 Cardiomegaly: Secondary | ICD-10-CM | POA: Insufficient documentation

## 2014-04-15 DIAGNOSIS — I472 Ventricular tachycardia: Secondary | ICD-10-CM | POA: Diagnosis not present

## 2014-04-15 DIAGNOSIS — I2510 Atherosclerotic heart disease of native coronary artery without angina pectoris: Secondary | ICD-10-CM | POA: Diagnosis present

## 2014-04-15 DIAGNOSIS — I63412 Cerebral infarction due to embolism of left middle cerebral artery: Secondary | ICD-10-CM | POA: Diagnosis not present

## 2014-04-15 DIAGNOSIS — F17200 Nicotine dependence, unspecified, uncomplicated: Secondary | ICD-10-CM | POA: Diagnosis present

## 2014-04-15 DIAGNOSIS — R4701 Aphasia: Secondary | ICD-10-CM

## 2014-04-15 DIAGNOSIS — Z8673 Personal history of transient ischemic attack (TIA), and cerebral infarction without residual deficits: Secondary | ICD-10-CM

## 2014-04-15 DIAGNOSIS — R4182 Altered mental status, unspecified: Secondary | ICD-10-CM

## 2014-04-15 DIAGNOSIS — I110 Hypertensive heart disease with heart failure: Secondary | ICD-10-CM | POA: Diagnosis present

## 2014-04-15 DIAGNOSIS — I639 Cerebral infarction, unspecified: Secondary | ICD-10-CM

## 2014-04-15 DIAGNOSIS — Z720 Tobacco use: Secondary | ICD-10-CM

## 2014-04-15 DIAGNOSIS — I129 Hypertensive chronic kidney disease with stage 1 through stage 4 chronic kidney disease, or unspecified chronic kidney disease: Secondary | ICD-10-CM | POA: Diagnosis present

## 2014-04-15 DIAGNOSIS — R9431 Abnormal electrocardiogram [ECG] [EKG]: Secondary | ICD-10-CM

## 2014-04-15 DIAGNOSIS — N179 Acute kidney failure, unspecified: Secondary | ICD-10-CM | POA: Diagnosis present

## 2014-04-15 DIAGNOSIS — Z79899 Other long term (current) drug therapy: Secondary | ICD-10-CM

## 2014-04-15 DIAGNOSIS — I4729 Other ventricular tachycardia: Secondary | ICD-10-CM

## 2014-04-15 LAB — ETHANOL: Alcohol, Ethyl (B): <5 mg/dL (ref 0–9)

## 2014-04-15 LAB — I-STAT CHEM 8, ED
BUN: 20 mg/dL (ref 6–23)
CALCIUM ION: 1.18 mmol/L (ref 1.13–1.30)
CHLORIDE: 105 meq/L (ref 96–112)
CREATININE: 1.6 mg/dL — AB (ref 0.50–1.35)
GLUCOSE: 185 mg/dL — AB (ref 70–99)
HCT: 46 % (ref 39.0–52.0)
Hemoglobin: 15.6 g/dL (ref 13.0–17.0)
Potassium: 4.6 mmol/L (ref 3.5–5.1)
Sodium: 139 mmol/L (ref 135–145)
TCO2: 21 mmol/L (ref 0–100)

## 2014-04-15 LAB — PROTIME-INR
INR: 1.01 (ref 0.00–1.49)
Prothrombin Time: 13.4 seconds (ref 11.6–15.2)

## 2014-04-15 LAB — CBC
HEMATOCRIT: 42.9 % (ref 39.0–52.0)
Hemoglobin: 14.3 g/dL (ref 13.0–17.0)
MCH: 30.7 pg (ref 26.0–34.0)
MCHC: 33.3 g/dL (ref 30.0–36.0)
MCV: 92.1 fL (ref 78.0–100.0)
PLATELETS: 138 10*3/uL — AB (ref 150–400)
RBC: 4.66 MIL/uL (ref 4.22–5.81)
RDW: 13 % (ref 11.5–15.5)
WBC: 4.8 10*3/uL (ref 4.0–10.5)

## 2014-04-15 LAB — COMPREHENSIVE METABOLIC PANEL
ALK PHOS: 116 U/L (ref 39–117)
ALT: 11 U/L (ref 0–53)
AST: 16 U/L (ref 0–37)
Albumin: 3.4 g/dL — ABNORMAL LOW (ref 3.5–5.2)
Anion gap: 8 (ref 5–15)
BUN: 17 mg/dL (ref 6–23)
CALCIUM: 9.1 mg/dL (ref 8.4–10.5)
CO2: 25 mmol/L (ref 19–32)
CREATININE: 1.46 mg/dL — AB (ref 0.50–1.35)
Chloride: 105 mEq/L (ref 96–112)
GFR calc Af Amer: 52 mL/min — ABNORMAL LOW (ref >90)
GFR calc non Af Amer: 45 mL/min — ABNORMAL LOW (ref >90)
Glucose, Bld: 182 mg/dL — ABNORMAL HIGH (ref 70–99)
Potassium: 4.6 mmol/L (ref 3.5–5.1)
Sodium: 138 mmol/L (ref 135–145)
TOTAL PROTEIN: 6.4 g/dL (ref 6.0–8.3)
Total Bilirubin: 0.6 mg/dL (ref 0.3–1.2)

## 2014-04-15 LAB — RAPID URINE DRUG SCREEN, HOSP PERFORMED
Amphetamines: NOT DETECTED
Barbiturates: NOT DETECTED
Benzodiazepines: NOT DETECTED
Cocaine: NOT DETECTED
OPIATES: NOT DETECTED
TETRAHYDROCANNABINOL: NOT DETECTED

## 2014-04-15 LAB — DIFFERENTIAL
BASOS ABS: 0 10*3/uL (ref 0.0–0.1)
BASOS PCT: 1 % (ref 0–1)
Eosinophils Absolute: 0.1 10*3/uL (ref 0.0–0.7)
Eosinophils Relative: 2 % (ref 0–5)
LYMPHS ABS: 1.7 10*3/uL (ref 0.7–4.0)
Lymphocytes Relative: 36 % (ref 12–46)
Monocytes Absolute: 0.4 10*3/uL (ref 0.1–1.0)
Monocytes Relative: 8 % (ref 3–12)
NEUTROS ABS: 2.6 10*3/uL (ref 1.7–7.7)
NEUTROS PCT: 53 % (ref 43–77)

## 2014-04-15 LAB — URINALYSIS, ROUTINE W REFLEX MICROSCOPIC
BILIRUBIN URINE: NEGATIVE
GLUCOSE, UA: NEGATIVE mg/dL
Hgb urine dipstick: NEGATIVE
KETONES UR: NEGATIVE mg/dL
Leukocytes, UA: NEGATIVE
Nitrite: NEGATIVE
PH: 6 (ref 5.0–8.0)
Protein, ur: NEGATIVE mg/dL
Specific Gravity, Urine: 1.018 (ref 1.005–1.030)
Urobilinogen, UA: 0.2 mg/dL (ref 0.0–1.0)

## 2014-04-15 LAB — TROPONIN I: Troponin I: <0.03 ng/mL (ref <0.031)

## 2014-04-15 LAB — I-STAT TROPONIN, ED: Troponin i, poc: 0.04 ng/mL (ref 0.00–0.08)

## 2014-04-15 LAB — APTT: aPTT: 28 seconds (ref 24–37)

## 2014-04-15 MED ORDER — SODIUM CHLORIDE 0.9 % IV SOLN
INTRAVENOUS | Status: DC
  Administered 2014-04-15: 19:00:00 via INTRAVENOUS

## 2014-04-15 MED ORDER — RIVAROXABAN 20 MG PO TABS
20.0000 mg | ORAL_TABLET | Freq: Every day | ORAL | Status: DC
  Administered 2014-04-16 – 2014-04-18 (×3): 20 mg via ORAL
  Filled 2014-04-15 (×3): qty 1

## 2014-04-15 MED ORDER — CARVEDILOL 3.125 MG PO TABS
3.1250 mg | ORAL_TABLET | Freq: Every day | ORAL | Status: DC
Start: 2014-04-16 — End: 2014-04-19
  Administered 2014-04-17 – 2014-04-18 (×2): 3.125 mg via ORAL
  Filled 2014-04-15 (×4): qty 1

## 2014-04-15 MED ORDER — DONEPEZIL HCL 5 MG PO TABS
5.0000 mg | ORAL_TABLET | Freq: Every day | ORAL | Status: DC
  Administered 2014-04-15 – 2014-04-18 (×4): 5 mg via ORAL
  Filled 2014-04-15 (×4): qty 1

## 2014-04-15 MED ORDER — SENNOSIDES-DOCUSATE SODIUM 8.6-50 MG PO TABS: 1.0000 | ORAL_TABLET | Freq: Every evening | ORAL | Status: DC | PRN

## 2014-04-15 MED ORDER — STROKE: EARLY STAGES OF RECOVERY BOOK
Freq: Once | Status: AC
  Administered 2014-04-15: 20:00:00
  Filled 2014-04-15: qty 1

## 2014-04-15 MED ORDER — LATANOPROST 0.005 % OP SOLN
1.0000 [drp] | Freq: Every day | OPHTHALMIC | Status: DC
  Administered 2014-04-15 – 2014-04-18 (×4): 1 [drp] via OPHTHALMIC
  Filled 2014-04-15: qty 2.5

## 2014-04-15 NOTE — Consult Note (Signed)
Referring Physician: Alvino Chapel    Chief Complaint: Expressive aphasia  HPI:                                                                                                                                         Grant Lara is an 74 y.o. 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  Patient was last seen normal at 1400 hours today. He called his daughter at 12 and was noted to have trouble getting his words out. EMS was called to house and code stroke. On arrival he remained expressively aphasic with no other symptoms. tPA was not given due to being on Xeralto.   Date last known well: Date: 04/15/2014 Time last known well: Time: 14:00 tPA Given: No: on Xeralto  Past Medical History  Diagnosis Date  . Hypertension   . Dilated cardiomyopathy     2/12: EF 30-35%, trivial AI, mild RAE.  EF 2014 40 -45%  . CAD (coronary artery disease)     LHC 9/05 with Dr. Einar Gip:  dLM 20-30%, LAD 85%, oD1 20-30%.  PCI:  Taxus DES to LAD; Dx jailed and tx with POBA.  Last myoview 12/10: inf scar, no ischemia, EF 29%.  . DVT (deep venous thrombosis)   . Pulmonary embolus     chronic coumadin  . Hyperhomocystinemia   . PFO (patent foramen ovale)     Not mentioned on 2014 echo.  [name] HLD (hyperlipidemia)   . Crohn's disease   . Nephrolithiasis   . Diabetes mellitus     11/05/11 "borderline; don't take medications"  . Stroke 1993    "left arm can't hold steady; leg too"  . Arthritis     "used to have a touch in my legs"  . Memory difficulties   . Pneumonia ~ 2011    09-22-13 denies any recent SOB or breathing problems  . Swelling of both ankles      09-22-13 occ.feet, but denies pain.    Past Surgical History  Procedure Laterality Date  . Colon surgery  1994; 1996    "for Crohn's disease"  . Appendectomy      Family History  Problem Relation Age of Onset  . Diabetes type II Mother   . CAD Father   . Diabetes type II Brother    Social History:  reports that he has been smoking  Cigarettes.  He has a 26.5 pack-year smoking history. He has never used smokeless tobacco. He reports that he does not drink alcohol or use illicit drugs.  Allergies: No Known Allergies  Medications:  No current facility-administered medications for this encounter.   Current Outpatient Prescriptions  Medication Sig Dispense Refill  . acetaminophen (TYLENOL) 325 MG tablet Take 2 tablets (650 mg total) by mouth every 6 (six) hours as needed for mild pain, moderate pain or fever.    . carvedilol (COREG) 3.125 MG tablet Take 1 tablet (3.125 mg total) by mouth daily. 90 tablet 3  . donepezil (ARICEPT) 5 MG tablet Take 5 mg by mouth at bedtime.    [name] glipiZIDE (GLUCOTROL) 5 MG tablet Take 1 tablet (5 mg total) by mouth daily before breakfast. 30 tablet 0  . glucose monitoring kit (FREESTYLE) monitoring kit 1 each by Does not apply route as needed for other. Please give 100 lancets and testing strips.  Check sugars every morning before breakfast and every evening before dinner 1 each 0  . latanoprost (XALATAN) 0.005 % ophthalmic solution Place 1 drop into both eyes at bedtime.    [name] lisinopril (PRINIVIL,ZESTRIL) 2.5 MG tablet Take 1 tablet (2.5 mg total) by mouth every morning. 90 tablet 3  . rivaroxaban (XARELTO) 20 MG TABS tablet Take 1 tablet (20 mg total) by mouth daily with supper. 30 tablet 0     ROS:                                                                                                                                       History obtained from unobtainable from patient due to language barrier    Neurologic Examination:                                                                                                      SpO2 96 %.  HEENT-  Normocephalic, no lesions, without obvious abnormality.  Normal external eye and conjunctiva.  Normal TM's bilaterally.   Normal auditory canals and external ears. Normal external nose, mucus membranes and septum.  Normal pharynx. Cardiovascular- regular rate and rhythm, S1, S2 normal, no murmur, click, rub or gallop, pulses palpable throughout   Lungs- chest clear, no wheezing, rales, normal symmetric air entry Abdomen- soft, non-tender; bowel sounds normal; no masses,  no organomegaly Extremities- less then 2 second capillary refill Lymph-no adenopathy palpable Musculoskeletal-no muscular tenderness noted Skin-warm and dry, no hyperpigmentation, vitiligo, or suspicious lesions  Neurological Examination Mental Status: Alert .  Speech fluent with evidence of expressive aphasia.  Able to follow simple commands without difficulty. Cranial Nerves: II: Discs flat bilaterally; Visual fields grossly normal, pupils equal, round, reactive to  light and accommodation III,IV, VI: ptosis not present, extra-ocular motions intact bilaterally V,VII: smile symmetric, facial light touch sensation normal bilaterally VIII: hearing normal bilaterally IX,X: gag reflex present XI: bilateral shoulder shrug XII: midline tongue extension Motor: Right : Upper extremity   5/5    Left:     Upper extremity   5/5  Lower extremity   5/5     Lower extremity   5/5 Tone and bulk:normal tone throughout; no atrophy noted Sensory: Pinprick and light touch intact throughout, bilaterally Deep Tendon Reflexes: 1+ and symmetric throughout UE and no AJ Plantars: Right: downgoing   Left: downgoing Cerebellar: normal finger-to-nose, Gait: not tested due to safety       Lab Results: Basic Metabolic Panel: No results for input(s): NA, K, CL, CO2, GLUCOSE, BUN, CREATININE, CALCIUM, MG, PHOS in the last 168 hours.  Liver Function Tests: No results for input(s): AST, ALT, ALKPHOS, BILITOT, PROT, ALBUMIN in the last 168 hours. No results for input(s): LIPASE, AMYLASE in the last 168 hours. No results for input(s): AMMONIA in the last 168  hours.  CBC: No results for input(s): WBC, NEUTROABS, HGB, HCT, MCV, PLT in the last 168 hours.  Cardiac Enzymes: No results for input(s): CKTOTAL, CKMB, CKMBINDEX, TROPONINI in the last 168 hours.  Lipid Panel: No results for input(s): CHOL, TRIG, HDL, CHOLHDL, VLDL, LDLCALC in the last 168 hours.  CBG: No results for input(s): GLUCAP in the last 168 hours.  Microbiology: Results for orders placed or performed during the hospital encounter of 11/19/13  Urine culture     Status: None   Collection Time: 11/19/13  9:08 PM  Result Value Ref Range Status   Specimen Description URINE, RANDOM  Final   Special Requests NONE  Final   Culture  Setup Time   Final    11/20/2013 11:27 Performed at [facility]   Final    10,000 COLONIES/ML Performed at [facility]   Culture   Final    Multiple bacterial morphotypes present, none predominant. Suggest appropriate recollection if clinically indicated. Performed at [facility]   Report Status 11/21/2013 FINAL  Final  Blood culture (routine x 2)     Status: None   Collection Time: 11/19/13 11:45 PM  Result Value Ref Range Status   Specimen Description BLOOD RIGHT ARM  Final   Special Requests BOTTLES DRAWN AEROBIC AND ANAEROBIC 10CC  Final   Culture  Setup Time   Final    11/20/2013 03:06 Performed at [facility]   Culture   Final    NO GROWTH 5 DAYS Performed at [facility]   Report Status 11/26/2013 FINAL  Final  Blood culture (routine x 2)     Status: None   Collection Time: 11/19/13 11:55 PM  Result Value Ref Range Status   Specimen Description BLOOD LEFT ARM  Final   Special Requests BOTTLES DRAWN AEROBIC ONLY 10CC  Final   Culture  Setup Time   Final    11/20/2013 03:06 Performed at [facility]   Culture   Final    NO GROWTH 5 DAYS Performed at [facility]   Report Status 11/26/2013 FINAL  Final    Coagulation Studies: No results for input(s):  LABPROT, INR in the last 72 hours.  Imaging: Ct Head Wo Contrast  04/15/2014   CLINICAL DATA:  Ectasia.  Hypertension.  Coronary artery disease.  EXAM: CT HEAD WITHOUT CONTRAST  TECHNIQUE: Contiguous axial  images were obtained from the base of the skull through the vertex without intravenous contrast.  COMPARISON:  07/17/2013  FINDINGS: Sinuses/Soft tissues: Cerumen in both external ear canals. Clear paranasal sinuses and mastoid air cells.  Intracranial: Advanced cerebral atrophy. Moderate low density in the periventricular white matter likely related to small vessel disease.  Remote left-sided cerebellar infarct. Bilateral basal ganglia lacunar infarcts which are also felt to be chronic. Cortically based infarct involving the right occipital lobe is not significantly changed.  No mass lesion, hemorrhage, hydrocephalus, acute infarct, intra-axial, or extra-axial fluid collection.  IMPRESSION: 1.  No acute intracranial abnormality. 2. Advanced atrophy with remote infarcts and diffuse small vessel ischemic change. These results were called by telephone at the time of interpretation on 04/15/2014 at 4:20 pm to Dr. Armida Sans, who verbally acknowledged these results.   Electronically Signed   By: Abigail Miyamoto M.D.   On: 04/15/2014 16:20       Assessment and plan discussed with with attending physician and they are in agreement.    Etta Quill PA-C [facility] [phone]  04/15/2014, 4:28 PM   Assessment: 74 y.o. male presenting to [facility] as code stroke.  Exam shows only expressive aphasia.  He was not a tPA candidate secondary to being on Xeralto and not felt to be a IR candidate due to likely hood of small branch infarct.   Stroke Risk Factors - diabetes mellitus and hypertension  Recommend: 1. HgbA1c, fasting lipid panel 2. MRI, MRA  of the brain without contrast 3. PT consult, OT consult, Speech consult 4. Echocardiogram 5. Carotid dopplers 6. Prophylactic therapy-Anticoagulation:  Xeralto 7. Risk factor modification 8. Telemetry monitoring 9. Frequent neuro checks 10 NPO until passes stroke swallow screen  Patient seen and examined together with physician assistant and I concur with the assessment and plan.  Dorian Pod, MD

## 2014-04-15 NOTE — ED Notes (Addendum)
Cardiology MD at bedside.

## 2014-04-15 NOTE — ED Notes (Addendum)
Wife would like to be called once pt is settled in room and if their are any significant changes in the pts status.

## 2014-04-15 NOTE — ED Provider Notes (Signed)
CSN: [id number]     Arrival date & time 04/15/14  1603 History   First MD Initiated Contact with Patient 04/15/14 1606     Chief Complaint  Patient presents with  . Code Stroke   Level V caveat due to aphasia An emergency department physician performed an initial assessment on this suspected stroke patient at 8. (Consider location/radiation/quality/duration/timing/severity/associated sxs/prior Treatment) HPI Patient presented as a code stroke. Difficulty speaking initially hypertensive. Last normal at 2:00. At 3:30 left message on answering machine that showed difficulty speaking. He is on Xarelto.  Past Medical History  Diagnosis Date  . Hypertension   . Dilated cardiomyopathy     2/12: EF 30-35%, trivial AI, mild RAE.  EF 2014 40 -45%  . CAD (coronary artery disease)     LHC 9/05 with Dr. Einar Gip:  dLM 20-30%, LAD 85%, oD1 20-30%.  PCI:  Taxus DES to LAD; Dx jailed and tx with POBA.  Last myoview 12/10: inf scar, no ischemia, EF 29%.  . DVT (deep venous thrombosis)   . Pulmonary embolus     chronic coumadin  . Hyperhomocystinemia   . PFO (patent foramen ovale)     Not mentioned on 2014 echo.  [name] HLD (hyperlipidemia)   . Crohn's disease   . Nephrolithiasis   . Diabetes mellitus     11/05/11 "borderline; don't take medications"  . Stroke 1993    "left arm can't hold steady; leg too"  . Arthritis     "used to have a touch in my legs"  . Memory difficulties   . Pneumonia ~ 2011    09-22-13 denies any recent SOB or breathing problems  . Swelling of both ankles      09-22-13 occ.feet, but denies pain.   Past Surgical History  Procedure Laterality Date  . Colon surgery  1994; 1996    "for Crohn's disease"  . Appendectomy     Family History  Problem Relation Age of Onset  . Diabetes type II Mother   . CAD Father   . Diabetes type II Brother    History  Substance Use Topics  . Smoking status: Current Every Day Smoker -- 0.50 packs/day for 53 years    Types: Cigarettes  .  Smokeless tobacco: Never Used  . Alcohol Use: No    Review of Systems  Unable to perform ROS     Allergies  Review of patient's allergies indicates no known allergies.  Home Medications   Prior to Admission medications   Medication Sig Start Date End Date Taking? Authorizing Provider  acetaminophen (TYLENOL) 325 MG tablet Take 2 tablets (650 mg total) by mouth every 6 (six) hours as needed for mild pain, moderate pain or fever. 11/23/13  Yes Modena Jansky, MD  carvedilol (COREG) 3.125 MG tablet Take 1 tablet (3.125 mg total) by mouth daily. 02/07/14  Yes Minus Breeding, MD  donepezil (ARICEPT) 5 MG tablet Take 5 mg by mouth at bedtime.   Yes Historical Provider, MD  latanoprost (XALATAN) 0.005 % ophthalmic solution Place 1 drop into both eyes at bedtime. 06/29/13  Yes Historical Provider, MD  lisinopril (PRINIVIL,ZESTRIL) 2.5 MG tablet Take 1 tablet (2.5 mg total) by mouth every morning. 02/07/14  Yes Minus Breeding, MD  rivaroxaban (XARELTO) 20 MG TABS tablet Take 1 tablet (20 mg total) by mouth daily with supper. 03/23/14  Yes Minus Breeding, MD  glipiZIDE (GLUCOTROL) 5 MG tablet Take 1 tablet (5 mg total) by mouth daily before breakfast. 08/23/13  Debbe Odea, MD  glucose monitoring kit (FREESTYLE) monitoring kit 1 each by Does not apply route as needed for other. Please give 100 lancets and testing strips.  Check sugars every morning before breakfast and every evening before dinner 08/23/13   Debbe Odea, MD   BP 155/72 mmHg  Pulse 46  Temp(Src) 98 F (36.7 C) (Oral)  Resp 13  Wt 138 lb 8 oz (62.823 kg)  SpO2 100% Physical Exam  Constitutional: He appears well-developed and well-nourished.  HENT:  Head: Atraumatic.  Eyes: EOM are normal.  Neck: Neck supple.  Cardiovascular: Normal rate and regular rhythm.   Pulmonary/Chest: Effort normal and breath sounds normal.  Neurological: He is alert.  Some difficulty speaking. Trouble getting words out. Will answer some  questions but more of a yes or no. He will follow commands. Moving all extremities. Complete NIH score done by neurology.  Skin: Skin is warm.    ED Course  Procedures (including critical care time) Labs Review Labs Reviewed  CBC - Abnormal; Notable for the following:    Platelets 138 (*)    All other components within normal limits  COMPREHENSIVE METABOLIC PANEL - Abnormal; Notable for the following:    Glucose, Bld 182 (*)    Creatinine, Ser 1.46 (*)    Albumin 3.4 (*)    GFR calc non Af Amer 45 (*)    GFR calc Af Amer 52 (*)    All other components within normal limits  I-STAT CHEM 8, ED - Abnormal; Notable for the following:    Creatinine, Ser 1.60 (*)    Glucose, Bld 185 (*)    All other components within normal limits  ETHANOL  PROTIME-INR  APTT  DIFFERENTIAL  URINALYSIS, ROUTINE W REFLEX MICROSCOPIC  URINE RAPID DRUG SCREEN (HOSP PERFORMED)  I-STAT TROPOININ, ED  I-STAT TROPOININ, ED    Imaging Review Ct Head Wo Contrast  04/15/2014   CLINICAL DATA:  Ectasia.  Hypertension.  Coronary artery disease.  EXAM: CT HEAD WITHOUT CONTRAST  TECHNIQUE: Contiguous axial images were obtained from the base of the skull through the vertex without intravenous contrast.  COMPARISON:  07/17/2013  FINDINGS: Sinuses/Soft tissues: Cerumen in both external ear canals. Clear paranasal sinuses and mastoid air cells.  Intracranial: Advanced cerebral atrophy. Moderate low density in the periventricular white matter likely related to small vessel disease.  Remote left-sided cerebellar infarct. Bilateral basal ganglia lacunar infarcts which are also felt to be chronic. Cortically based infarct involving the right occipital lobe is not significantly changed.  No mass lesion, hemorrhage, hydrocephalus, acute infarct, intra-axial, or extra-axial fluid collection.  IMPRESSION: 1.  No acute intracranial abnormality. 2. Advanced atrophy with remote infarcts and diffuse small vessel ischemic change. These  results were called by telephone at the time of interpretation on 04/15/2014 at 4:20 pm to Dr. Armida Sans, who verbally acknowledged these results.   Electronically Signed   By: Abigail Miyamoto M.D.   On: 04/15/2014 16:20   Dg Chest Portable 1 View  04/15/2014   CLINICAL DATA:  One-day history of altered mental status, initial encounter.  EXAM: PORTABLE CHEST - 1 VIEW  COMPARISON:  11/20/2013  FINDINGS: 1638 hrs. Lungs are hyperexpanded The lungs are clear without focal infiltrate, edema, pneumothorax or pleural effusion. Cardiopericardial silhouette is at upper limits of normal for size. Imaged bony structures of the thorax are intact. Telemetry leads overlie the chest.  IMPRESSION: Hyperexpansion without acute cardiopulmonary findings.   Electronically Signed   By: Verda Cumins.D.  On: 04/15/2014 16:50     EKG Interpretation   Date/Time:  Friday April 15 2014 16:26:36 EST Ventricular Rate:  66 PR Interval:  172 QRS Duration: 88 QT Interval:  409 QTC Calculation: 428 R Axis:   55 Text Interpretation:  Sinus rhythm Left ventricular hypertrophy Anterior  infarct, acute (LAD) Lateral leads are also involved Artifact in lead(s) I  III aVR aVL aVF V6 worsening of chronic anterior changes Confirmed by  Alvino Chapel  MD, Ovid Curd [phone]) on 04/15/2014 4:38:23 PM      MDM   Final diagnoses:  Altered mental status  Aphasia    Patient with difficulty speaking. May be a stroke. Party on anticoagulation so not a TPA candidate. Seen by neurology. Patient denies chest pain. He does have some EKG changes. He has worsening report on his LVH. In some T-wave and ST elevation. Initial troponin negative. May be related to intracranial findings. Will admit to internal medicine and cardiology has been consulted.     Jasper Riling. Alvino Chapel, MD 04/15/14 1745

## 2014-04-15 NOTE — ED Notes (Signed)
Per [facility], pt from home for Code Stroke. Pt was last seen well at 1400 after talking to his wife. Left a garbled message on son's voice mail at 1530.  Has had some confusion and left sided weakness per EMS. Unsuccessful at IV insertion

## 2014-04-15 NOTE — ED Notes (Signed)
Pt returned from CT scan to room D31

## 2014-04-15 NOTE — Consult Note (Signed)
CARDIOLOGY CONSULT NOTE   Patient ID: Grant Lara MRN: [id number], DOB/AGE: [birth year]   Admit date: 04/15/2014 Date of Consult: 04/15/2014   Primary Physician: Lynne Logan, MD Primary Cardiologist: Dr. Percival Spanish  Pt. Profile  74 year old gentleman admitted with a probable stroke.  We were asked by Dr. Alvino Chapel to see the patient because of an abnormal EKG.  Problem List  Past Medical History  Diagnosis Date  . Hypertension   . Dilated cardiomyopathy     2/12: EF 30-35%, trivial AI, mild RAE.  EF 2014 40 -45%  . CAD (coronary artery disease)     LHC 9/05 with Dr. Einar Gip:  dLM 20-30%, LAD 85%, oD1 20-30%.  PCI:  Taxus DES to LAD; Dx jailed and tx with POBA.  Last myoview 12/10: inf scar, no ischemia, EF 29%.  . DVT (deep venous thrombosis)   . Pulmonary embolus     chronic coumadin  . Hyperhomocystinemia   . PFO (patent foramen ovale)     Not mentioned on 2014 echo.  [name] HLD (hyperlipidemia)   . Crohn's disease   . Nephrolithiasis   . Diabetes mellitus     11/05/11 "borderline; don't take medications"  . Stroke 1993    "left arm can't hold steady; leg too"  . Arthritis     "used to have a touch in my legs"  . Memory difficulties   . Pneumonia ~ 2011    09-22-13 denies any recent SOB or breathing problems  . Swelling of both ankles      09-22-13 occ.feet, but denies pain.    Past Surgical History  Procedure Laterality Date  . Colon surgery  1994; 1996    "for Crohn's disease"  . Appendectomy       Allergies  No Known Allergies  HPI   This 74 year old African-American gentleman has a past history of a dilated cardiomyopathy.  He has a history of prior stroke.  He has been on long-term anticoagulation previously with Coumadin and more recently switched to Xarelto.  He has a history of a dilated cardiomyopathy with an ejection fraction of 30-35% in 2012.  His most recent echocardiogram of 11/22/13 is as noted below: - Left ventricle: The cavity size was mildly dilated.  Wall thickness was normal. Systolic function was severely reduced. The estimated ejection fraction was in the range of 25% to 30%. Diffuse hypokinesis. Doppler parameters are consistent with abnormal left ventricular relaxation (grade 1 diastolic dysfunction). - Aortic valve: There was mild regurgitation. - Mitral valve: There was mild regurgitation. - Pericardium, extracardiac: A trivial pericardial effusion was identified.  Impressions:  - Severe global reduction in LV function; grade 1 diastolic dysfunction; mild AI and MR. The patient has been on carvedilol and lisinopril for his diabetic cardiomyopathy.  The patient is also a diabetic.  He has a history of dementia and is on generic Aricept. He was in his usual state of health this afternoon.  When his family returned from running an errand they noticed that he was having difficulty speaking.  He did not appear to have any other neurologic deficits.  He denies any recent chest pain.  He is not dyspneic.   Inpatient Medications    Family History Family History  Problem Relation Age of Onset  . Diabetes type II Mother   . CAD Father   . Diabetes type II Brother      Social History History   Social History  . Marital Status: Married    Spouse Name:  N/A    Number of Children: N/A  . Years of Education: N/A   Occupational History  . Not on file.   Social History Main Topics  . Smoking status: Current Every Day Smoker -- 0.50 packs/day for 53 years    Types: Cigarettes  . Smokeless tobacco: Never Used  . Alcohol Use: No  . Drug Use: No  . Sexual Activity: Not Currently   Other Topics Concern  . Not on file   Social History Narrative     Review of Systems  General:  No chills, fever, night sweats or weight changes.  Cardiovascular:  No chest pain, dyspnea on exertion, edema, orthopnea, palpitations, paroxysmal nocturnal dyspnea. Dermatological: No rash, lesions/masses Respiratory: No cough,  dyspnea Urologic: No hematuria, dysuria Abdominal:   No nausea, vomiting, diarrhea, bright red blood per rectum, melena, or hematemesis Neurologic:  No visual changes, wkns, changes in mental status. All other systems reviewed and are otherwise negative except as noted above.  Physical Exam  Blood pressure 160/77, pulse 63, temperature 97.9 F (36.6 C), temperature source Oral, resp. rate 16, weight 138 lb 8 oz (62.823 kg), SpO2 100 %.  General: Pleasant, NAD.  Mild expressive aphasia Psych: Normal affect. Neuro: Moves all extremities spontaneously.  Has difficulty with word finding when trying to speak. HEENT: Normal  Neck: Supple without bruits or JVD. Lungs:  Resp regular and unlabored, CTA. Heart: RRR no s3, s4, or murmurs.  He is in sinus bradycardia Abdomen: Soft, non-tender, non-distended, BS + x 4.  Extremities: No clubbing, cyanosis or edema. DP/PT/Radials 2+ and equal bilaterally.  Labs  No results for input(s): CKTOTAL, CKMB, TROPONINI in the last 72 hours. Lab Results  Component Value Date   WBC 4.8 04/15/2014   HGB 15.6 04/15/2014   HCT 46.0 04/15/2014   MCV 92.1 04/15/2014   PLT 138* 04/15/2014     Recent Labs Lab 04/15/14 1619 04/15/14 1626  NA 138 139  K 4.6 4.6  CL 105 105  CO2 25  --   BUN 17 20  CREATININE 1.46* 1.60*  CALCIUM 9.1  --   PROT 6.4  --   BILITOT 0.6  --   ALKPHOS 116  --   ALT 11  --   AST 16  --   GLUCOSE 182* 185*   Lab Results  Component Value Date   CHOL  07/03/2010    153        ATP III CLASSIFICATION:  <200     mg/dL   Desirable  200-239  mg/dL   Borderline High  >=240    mg/dL   High          HDL 47 07/03/2010   LDLCALC  07/03/2010    92        Total Cholesterol/HDL:CHD Risk Coronary Heart Disease Risk Table                     Men   Women  1/2 Average Risk   3.4   3.3  Average Risk       5.0   4.4  2 X Average Risk   9.6   7.1  3 X Average Risk  23.4   11.0        Use the calculated Patient Ratio above and  the CHD Risk Table to determine the patient's CHD Risk.        ATP III CLASSIFICATION (LDL):  <100     mg/dL   Optimal  100-129  mg/dL   Near or Above                    Optimal  130-159  mg/dL   Borderline  160-189  mg/dL   High  >190     mg/dL   Very High   TRIG 70 07/03/2010   Lab Results  Component Value Date   DDIMER 5.75* 11/20/2013    Radiology/Studies  Ct Head Wo Contrast  04/15/2014   CLINICAL DATA:  Ectasia.  Hypertension.  Coronary artery disease.  EXAM: CT HEAD WITHOUT CONTRAST  TECHNIQUE: Contiguous axial images were obtained from the base of the skull through the vertex without intravenous contrast.  COMPARISON:  07/17/2013  FINDINGS: Sinuses/Soft tissues: Cerumen in both external ear canals. Clear paranasal sinuses and mastoid air cells.  Intracranial: Advanced cerebral atrophy. Moderate low density in the periventricular white matter likely related to small vessel disease.  Remote left-sided cerebellar infarct. Bilateral basal ganglia lacunar infarcts which are also felt to be chronic. Cortically based infarct involving the right occipital lobe is not significantly changed.  No mass lesion, hemorrhage, hydrocephalus, acute infarct, intra-axial, or extra-axial fluid collection.  IMPRESSION: 1.  No acute intracranial abnormality. 2. Advanced atrophy with remote infarcts and diffuse small vessel ischemic change. These results were called by telephone at the time of interpretation on 04/15/2014 at 4:20 pm to Dr. Armida Sans, who verbally acknowledged these results.   Electronically Signed   By: Abigail Miyamoto M.D.   On: 04/15/2014 16:20   Dg Chest Portable 1 View  04/15/2014   CLINICAL DATA:  One-day history of altered mental status, initial encounter.  EXAM: PORTABLE CHEST - 1 VIEW  COMPARISON:  11/20/2013  FINDINGS: 1638 hrs. Lungs are hyperexpanded The lungs are clear without focal infiltrate, edema, pneumothorax or pleural effusion. Cardiopericardial silhouette is at upper limits of  normal for size. Imaged bony structures of the thorax are intact. Telemetry leads overlie the chest.  IMPRESSION: Hyperexpansion without acute cardiopulmonary findings.   Electronically Signed   By: Misty Stanley M.D.   On: 04/15/2014 16:50    ECG  Sinus bradycardia Atrial premature complex Consider left atrial enlargement Abnormal R-wave progression, early transition Left ventricular hypertrophy Nonspecific T abnormalities, inferior leads ST elevation, consider anterolateral injury  I personally reviewed the EKG.  He has marked LVH and marked ST changes of benign repolarization which are similar but more dramatic than on previous EKG of 02/07/14 which had similar but less dramatic ST segment changes.  ASSESSMENT AND PLAN  1.  Probable recurrent stroke with new expressive aphasia 2.  Abnormal EKG with marked benign early repolarization in anterior leads, not significantly changed from prior EKG of October 2015 3.  Dilated cardiomyopathy with ejection fraction of 25-30% by echocardiogram 11/22/13. 4.  Diabetes mellitus 5.  Dementia  Disposition: Would continue his current medication for his dilated cardiomyopathy i.e. ACE inhibitor and beta blocker.  Continue Xarelto. Wynonia Musty Talynn Lebon,MD  04/15/2014, 5:25 PM

## 2014-04-15 NOTE — H&P (Signed)
[facility] History and Physical  [name]:[id number] DOB: Mar 18, 1941 DOA: 04/15/2014  Referring physician: er PCP: Lynne Logan, MD   Chief Complaint: speaking difficulties  HPI: Grant Lara is a 74 y.o. male  Who was brought in as a code stroke.  When his wife left him at 1400, he was ok.  He then left a garbled message on his son's voicemail at 3:30.  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  He is currently having difficulties with is speech.  Denies CP.  He was not a candidate for TPA due to Xarelto.  ? If patient has run out of xarelto,-- family not sure---, there appears to be a prior auth done on 12/30. And a message left by patient that he could not afford the xarelto on 12/22  ER physician also felt he had EKG changes- denies CP.   In the ER, his CT scan of head was negative for bleed.   hospitalist were asked to admit  Review of Systems:  Unable to do ROS as patient having word finding difficulties   Past Medical History  Diagnosis Date  . Hypertension   . Dilated cardiomyopathy     2/12: EF 30-35%, trivial AI, mild RAE.  EF 2014 40 -45%  . CAD (coronary artery disease)     LHC 9/05 with Dr. Einar Gip:  dLM 20-30%, LAD 85%, oD1 20-30%.  PCI:  Taxus DES to LAD; Dx jailed and tx with POBA.  Last myoview 12/10: inf scar, no ischemia, EF 29%.  . DVT (deep venous thrombosis)   . Pulmonary embolus     chronic coumadin  . Hyperhomocystinemia   . PFO (patent foramen ovale)     Not mentioned on 2014 echo.  [name] HLD (hyperlipidemia)   . Crohn's disease   . Nephrolithiasis   . Diabetes mellitus     11/05/11 "borderline; don't take medications"  . Stroke 1993    "left arm can't hold steady; leg too"  . Arthritis     "used to have a touch in my legs"  . Memory difficulties   . Pneumonia ~ 2011    09-22-13 denies any recent SOB or breathing problems  . Swelling of both ankles      09-22-13 occ.feet, but denies pain.   Past Surgical History  Procedure  Laterality Date  . Colon surgery  1994; 1996    "for Crohn's disease"  . Appendectomy     Social History:  reports that he has been smoking Cigarettes.  He has a 26.5 pack-year smoking history. He has never used smokeless tobacco. He reports that he does not drink alcohol or use illicit drugs.  No Known Allergies  Family History  Problem Relation Age of Onset  . Diabetes type II Mother   . CAD Father   . Diabetes type II Brother      Prior to Admission medications   Medication Sig Start Date End Date Taking? Authorizing Provider  acetaminophen (TYLENOL) 325 MG tablet Take 2 tablets (650 mg total) by mouth every 6 (six) hours as needed for mild pain, moderate pain or fever. 11/23/13  Yes Modena Jansky, MD  carvedilol (COREG) 3.125 MG tablet Take 1 tablet (3.125 mg total) by mouth daily. 02/07/14  Yes Minus Breeding, MD  donepezil (ARICEPT) 5 MG tablet Take 5 mg by mouth at bedtime.   Yes Historical Provider, MD  latanoprost (XALATAN) 0.005 % ophthalmic solution Place 1 drop into both eyes  at bedtime. 06/29/13  Yes Historical Provider, MD  lisinopril (PRINIVIL,ZESTRIL) 2.5 MG tablet Take 1 tablet (2.5 mg total) by mouth every morning. 02/07/14  Yes Minus Breeding, MD  rivaroxaban (XARELTO) 20 MG TABS tablet Take 1 tablet (20 mg total) by mouth daily with supper. 03/23/14  Yes Minus Breeding, MD  glipiZIDE (GLUCOTROL) 5 MG tablet Take 1 tablet (5 mg total) by mouth daily before breakfast. 08/23/13   Debbe Odea, MD  glucose monitoring kit (FREESTYLE) monitoring kit 1 each by Does not apply route as needed for other. Please give 100 lancets and testing strips.  Check sugars every morning before breakfast and every evening before dinner 08/23/13   Debbe Odea, MD   Physical Exam: Filed Vitals:   04/15/14 1624 04/15/14 1629  BP:  160/77  Pulse:  63  Temp:  97.9 F (36.6 C)  TempSrc:  Oral  Resp:  16  Weight:  62.823 kg (138 lb 8 oz)  SpO2: 96% 100%    Wt Readings from Last 3  Encounters:  04/15/14 62.823 kg (138 lb 8 oz)  02/07/14 62.143 kg (137 lb)  11/22/13 61.78 kg (136 lb 3.2 oz)    General:  Awake, no increase work of breathing Eyes: PERRL, normal lids, irises & conjunctiva ENT: grossly normal hearing, lips & tongue Neck: no LAD, masses or thyromegaly Cardiovascular: RRR, no m/r/g. No LE edema. Telemetry: brady Respiratory: diminished b/l Normal respiratory effort. Abdomen: soft, ntnd Skin: no rash or induration seen on limited exam Musculoskeletal: grossly normal tone BUE/BLE Neurologic: has word finding difficulties, some times using inappropriate words          Labs on Admission:  Basic Metabolic Panel:  Recent Labs Lab 04/15/14 1626  NA 139  K 4.6  CL 105  GLUCOSE 185*  BUN 20  CREATININE 1.60*   Liver Function Tests: No results for input(s): AST, ALT, ALKPHOS, BILITOT, PROT, ALBUMIN in the last 168 hours. No results for input(s): LIPASE, AMYLASE in the last 168 hours. No results for input(s): AMMONIA in the last 168 hours. CBC:  Recent Labs Lab 04/15/14 1619 04/15/14 1626  WBC 4.8  --   NEUTROABS 2.6  --   HGB 14.3 15.6  HCT 42.9 46.0  MCV 92.1  --   PLT 138*  --    Cardiac Enzymes: No results for input(s): CKTOTAL, CKMB, CKMBINDEX, TROPONINI in the last 168 hours.  BNP (last 3 results) No results for input(s): PROBNP in the last 8760 hours. CBG: No results for input(s): GLUCAP in the last 168 hours.  Radiological Exams on Admission: Ct Head Wo Contrast  04/15/2014   CLINICAL DATA:  Ectasia.  Hypertension.  Coronary artery disease.  EXAM: CT HEAD WITHOUT CONTRAST  TECHNIQUE: Contiguous axial images were obtained from the base of the skull through the vertex without intravenous contrast.  COMPARISON:  07/17/2013  FINDINGS: Sinuses/Soft tissues: Cerumen in both external ear canals. Clear paranasal sinuses and mastoid air cells.  Intracranial: Advanced cerebral atrophy. Moderate low density in the periventricular white  matter likely related to small vessel disease.  Remote left-sided cerebellar infarct. Bilateral basal ganglia lacunar infarcts which are also felt to be chronic. Cortically based infarct involving the right occipital lobe is not significantly changed.  No mass lesion, hemorrhage, hydrocephalus, acute infarct, intra-axial, or extra-axial fluid collection.  IMPRESSION: 1.  No acute intracranial abnormality. 2. Advanced atrophy with remote infarcts and diffuse small vessel ischemic change. These results were called by telephone at the time of interpretation  on 04/15/2014 at 4:20 pm to Dr. Armida Sans, who verbally acknowledged these results.   Electronically Signed   By: Abigail Miyamoto M.D.   On: 04/15/2014 16:20   Dg Chest Portable 1 View  04/15/2014   CLINICAL DATA:  One-day history of altered mental status, initial encounter.  EXAM: PORTABLE CHEST - 1 VIEW  COMPARISON:  11/20/2013  FINDINGS: 1638 hrs. Lungs are hyperexpanded The lungs are clear without focal infiltrate, edema, pneumothorax or pleural effusion. Cardiopericardial silhouette is at upper limits of normal for size. Imaged bony structures of the thorax are intact. Telemetry leads overlie the chest.  IMPRESSION: Hyperexpansion without acute cardiopulmonary findings.   Electronically Signed   By: Misty Stanley M.D.   On: 04/15/2014 16:50    EKG: Independently reviewed. 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  Cards was consulted by ER physician, cycle CE  HTN- continue home meds minus coreg and ACE   Code Status: full DVT Prophylaxis: Family Communication: wife at  bedside Disposition Plan:   Time spent: 25 min  Eulogio Bear [facility] Pager [phone]

## 2014-04-16 DIAGNOSIS — I639 Cerebral infarction, unspecified: Secondary | ICD-10-CM

## 2014-04-16 DIAGNOSIS — I4729 Other ventricular tachycardia: Secondary | ICD-10-CM

## 2014-04-16 DIAGNOSIS — I10 Essential (primary) hypertension: Secondary | ICD-10-CM

## 2014-04-16 DIAGNOSIS — I2699 Other pulmonary embolism without acute cor pulmonale: Secondary | ICD-10-CM

## 2014-04-16 DIAGNOSIS — N183 Chronic kidney disease, stage 3 unspecified: Secondary | ICD-10-CM | POA: Diagnosis present

## 2014-04-16 DIAGNOSIS — I119 Hypertensive heart disease without heart failure: Secondary | ICD-10-CM

## 2014-04-16 DIAGNOSIS — I472 Ventricular tachycardia: Secondary | ICD-10-CM

## 2014-04-16 DIAGNOSIS — I5022 Chronic systolic (congestive) heart failure: Secondary | ICD-10-CM

## 2014-04-16 DIAGNOSIS — N179 Acute kidney failure, unspecified: Secondary | ICD-10-CM

## 2014-04-16 DIAGNOSIS — E1121 Type 2 diabetes mellitus with diabetic nephropathy: Secondary | ICD-10-CM

## 2014-04-16 DIAGNOSIS — I2510 Atherosclerotic heart disease of native coronary artery without angina pectoris: Secondary | ICD-10-CM

## 2014-04-16 LAB — LIPID PANEL
CHOLESTEROL: 149 mg/dL (ref 0–200)
HDL: 48 mg/dL (ref >39)
LDL CALC: 80 mg/dL (ref 0–99)
Total CHOL/HDL Ratio: 3.1 RATIO
Triglycerides: 104 mg/dL (ref <150)
VLDL: 21 mg/dL (ref 0–40)

## 2014-04-16 LAB — GLUCOSE, CAPILLARY
Glucose-Capillary: 75 mg/dL (ref 70–99)
Glucose-Capillary: 98 mg/dL (ref 70–99)

## 2014-04-16 LAB — TROPONIN I
TROPONIN I: 0.03 ng/mL (ref <0.031)
Troponin I: 0.03 ng/mL (ref <0.031)

## 2014-04-16 LAB — MAGNESIUM: MAGNESIUM: 1.7 mg/dL (ref 1.5–2.5)

## 2014-04-16 MED ORDER — INSULIN ASPART 100 UNIT/ML ~~LOC~~ SOLN: 0.0000 [IU] | Freq: Three times a day (TID) | SUBCUTANEOUS | Status: DC

## 2014-04-16 MED ORDER — LISINOPRIL 2.5 MG PO TABS
2.5000 mg | ORAL_TABLET | Freq: Every day | ORAL | Status: DC
  Administered 2014-04-16 – 2014-04-18 (×3): 2.5 mg via ORAL
  Filled 2014-04-16 (×4): qty 1

## 2014-04-16 MED ORDER — ATORVASTATIN CALCIUM 10 MG PO TABS
10.0000 mg | ORAL_TABLET | Freq: Every day | ORAL | Status: DC
  Administered 2014-04-16 – 2014-04-18 (×3): 10 mg via ORAL
  Filled 2014-04-16 (×3): qty 1

## 2014-04-16 MED ORDER — ACETAMINOPHEN 325 MG PO TABS
650.0000 mg | ORAL_TABLET | Freq: Four times a day (QID) | ORAL | Status: DC | PRN
  Administered 2014-04-16: 650 mg via ORAL
  Filled 2014-04-16: qty 2

## 2014-04-16 NOTE — Progress Notes (Addendum)
Chart reviewed.   [facility] PROGRESS NOTE  Grant Lara YME:[id number] DOB: 1940-07-19 DOA: 04/15/2014 PCP: Lynne Logan, MD Summary 74 yo AA male presented 1/1 with difficulty talking. CT brain showed nothing acute. Neurology consulted. Cardiology consulted by EDP for abnormal EKG  Assessment/Plan: Principal Problem:   Acute embolic stroke: MRI showing Acute ischemic infarct involving the left parietotemporal region, with multiple additional smaller ischemic infarcts involving the left frontal lobe, bilateral parietal lobes, and right temporal-occipital region. Underlying embolic disease is suspected. Continue rivaroxiban. LDL 80, goal <70. Will start statin.  Echo pending. Carotids without critical stenosis. hgb a1c pending. PT recommending cir. Will consult. Not a TPA candidate, due to rivaroxiban PTA. Still with expressive aphasia, seems to have some apraxia as well Active Problems:   TOBACCO ABUSE   Essential hypertension, benign   Chronic systolic heart failure, compensated. Last EF 25-30%. D/c ivf. Continue ACE inhibitor, carvedilol   Recent Pulmonary embolism:  Continue rivaroxiban   DM2 (diabetes mellitus, type 2: check CBGs, SSI   NSVT (nonsustained ventricular tachycardia): K ok. Mag 1.7.   CKD (chronic kidney disease) stage 3, GFR 30-59 ml/min: baseline 1.1-1.6 Abnormal EKG: LVH, no change from previous  Code Status:  full Family Communication:  Wife by phone Disposition Plan:  CIR?  Consultants:  Nephrology  cardiology  Procedures:     Antibiotics:    HPI/Subjective: No complaints  Objective: Filed Vitals:   04/16/14 0918  BP: 139/61  Pulse: 50  Temp: 97.8 F (36.6 C)  Resp: 16    Intake/Output Summary (Last 24 hours) at 04/16/14 1153 Last data filed at 04/15/14 1658  Gross per 24 hour  Intake      0 ml  Output    150 ml  Net   -150 ml   Filed Weights   04/15/14 1629  Weight: 62.823 kg (138 lb 8 oz)    Exam:   General:  In chair.  Follows commands.  Cardiovascular: RRR without MGR  Respiratory: CTA without WRR  Abdomen: S, NT, ND  Ext: no CCE  Neuro: speech halting. Able to say one word answers with significant difficulty. Follows commands. Difficulty using fork. Motor 5/5 throughtout  Basic Metabolic Panel:  Recent Labs Lab 04/15/14 1619 04/15/14 1626 04/16/14 0525  NA 138 139  --   K 4.6 4.6  --   CL 105 105  --   CO2 25  --   --   GLUCOSE 182* 185*  --   BUN 17 20  --   CREATININE 1.46* 1.60*  --   CALCIUM 9.1  --   --   MG  --   --  1.7   Liver Function Tests:  Recent Labs Lab 04/15/14 1619  AST 16  ALT 11  ALKPHOS 116  BILITOT 0.6  PROT 6.4  ALBUMIN 3.4*   No results for input(s): LIPASE, AMYLASE in the last 168 hours. No results for input(s): AMMONIA in the last 168 hours. CBC:  Recent Labs Lab 04/15/14 1619 04/15/14 1626  WBC 4.8  --   NEUTROABS 2.6  --   HGB 14.3 15.6  HCT 42.9 46.0  MCV 92.1  --   PLT 138*  --    Cardiac Enzymes:  Recent Labs Lab 04/15/14 1854 04/16/14 0030 04/16/14 0525  TROPONINI <0.03 0.03 0.03    BNP (last 3 results) No results for input(s): PROBNP in the last 8760 hours. CBG: No results for input(s): GLUCAP in the last 168 hours.  No  results found for this or any previous visit (from the past 240 hour(s)).   Studies: Ct Head Wo Contrast  04/15/2014   CLINICAL DATA:  Ectasia.  Hypertension.  Coronary artery disease.  EXAM: CT HEAD WITHOUT CONTRAST  TECHNIQUE: Contiguous axial images were obtained from the base of the skull through the vertex without intravenous contrast.  COMPARISON:  07/17/2013  FINDINGS: Sinuses/Soft tissues: Cerumen in both external ear canals. Clear paranasal sinuses and mastoid air cells.  Intracranial: Advanced cerebral atrophy. Moderate low density in the periventricular white matter likely related to small vessel disease.  Remote left-sided cerebellar infarct. Bilateral basal ganglia lacunar infarcts which are also  felt to be chronic. Cortically based infarct involving the right occipital lobe is not significantly changed.  No mass lesion, hemorrhage, hydrocephalus, acute infarct, intra-axial, or extra-axial fluid collection.  IMPRESSION: 1.  No acute intracranial abnormality. 2. Advanced atrophy with remote infarcts and diffuse small vessel ischemic change. These results were called by telephone at the time of interpretation on 04/15/2014 at 4:20 pm to Dr. Armida Sans, who verbally acknowledged these results.   Electronically Signed   By: Abigail Miyamoto M.D.   On: 04/15/2014 16:20   [name]  04/16/2014   CLINICAL DATA:  Aphasia.  Evaluate for stroke.  Initial evaluation.  EXAM: MRI HEAD WITHOUT CONTRAST  MRA HEAD WITHOUT CONTRAST  TECHNIQUE: Multiplanar, multiecho pulse sequences of the brain and surrounding structures were obtained without intravenous contrast. Angiographic images of the head were obtained using MRA technique without contrast.  COMPARISON:  Prior CT from earlier the same day.  FINDINGS: MRI HEAD FINDINGS  Diffuse prominence of the CSF containing spaces is compatible with advanced generalized cerebral atrophy patchy and confluent T2/FLAIR hyperintensity within the periventricular and deep white matter both cerebral hemispheres most consistent with chronic small vessel ischemic disease. Overall, these findings have progressed relative to most recent MRI from 2012.  Multiple remote lacunar infarcts present within the basal ganglia bilaterally. Specifically, these involve the posterior limb of the left internal capsule as well as the right thalamus. Probable remote left thalamic infarct present as well. Bilateral cerebellar infarcts present, left slightly worse than right. Encephalomalacia within the high bifrontal regions may reflect remote infarcts as well.  There is abnormal restricted diffusion involving the left parietotemporal region, compatible with acute ischemic infarct. This region of infarct  involves primarily the cortical gray matter (series 4, image 26). No associated hemorrhage or significant mass effect. There is localized gyral swelling within the infarcted territory.  Several additional small acute ischemic infarcts present within the right temporal and occipital lobes (series 4, image 16). The largest of these measures 5 mm in the right occipital lobe. Few additional subcentimeter infarcts involve the gray matter more superiorly within the right parietal region (series 4, image 27). Ischemic infarct involving the left frontal lobe appears to be slightly more subacute in nature (series 4, image 29). Possible embolic phenomenon is suspected given the various vascular distributions. No associated hemorrhage.  No mass lesion or midline shift. No extra-axial fluid collection. The ventricular prominence related to global parenchymal volume loss present without hydrocephalus.  Craniocervical junction within normal limits. Degenerative changes noted within the visualized upper cervical spine. Pituitary gland within normal limits. No acute abnormality seen about the orbits.  Paranasal sinuses are clear.  No mastoid effusion.  MRA HEAD FINDINGS  ANTERIOR CIRCULATION:  In visualized portions of the distal cervical segments of the internal carotid arteries are widely patent bilaterally. The petrous  and cavernous segments are widely patent. There is mild multi focal narrowing of the supra clinoid segments bilaterally, left greater than right. A1 segments widely patent. Anterior communicating artery normal. Anterior cerebral arteries well opacified.  Mild multi focal atherosclerotic irregularity present within the M1 segments without hemodynamically significant stenosis. The right M1 segment appears to be widely patent. These changes are greater within the left MCA. Mild diffuse atherosclerotic irregularity seen within the distal MCA branches.  POSTERIOR CIRCULATION:  Left vertebral artery is dominant. There  is moderate multi focal narrowing of the distal right vertebral artery (series 6, image 132). Posterior inferior cerebral arteries patent bilaterally with multi focal atherosclerotic irregularity. Vertebrobasilar junction normal. Mild irregularity present within the basilar artery without focal high-grade stenosis.  Multi focal atherosclerotic irregularity present within the superior cerebellar arteries without occlusion or hemodynamically significant stenosis. P1 segments patent bilaterally. There is moderate short-segment stenosis of the proximal P2 segments bilaterally. P2 segments are opacified distally, although the most distal aspects are poorly evaluated.  No aneurysm or vascular malformation.  IMPRESSION: MRI HEAD IMPRESSION:  1. Acute ischemic infarct involving the left parietotemporal region, with multiple additional smaller ischemic infarcts involving the left frontal lobe, bilateral parietal lobes, and right temporal-occipital region. Underlying embolic disease is suspected given the various vascular distributions. 2. Multiple remote ischemic infarcts involving the bilateral basal ganglia and cerebellar hemispheres, left greater than right. 3. Advanced cerebral atrophy with chronic small vessel ischemic disease. These changes have progressed relative to most recent MRI from 2012.  MRA HEAD IMPRESSION:  1. No proximal branch occlusion identified within the intracranial circulation. 2. Moderate short-segment stenoses within the proximal P2 segments bilaterally. 3. Mild narrowing of the supra clinoid aspect of the internal carotid arteries bilaterally, left greater than right. 4. Moderate multi focal atherosclerotic narrowing of the distal right vertebral artery. The left vertebral artery is dominant. 5. Multi focal atherosclerotic irregularity involving the M1 segments, distal MCA branches, superior cerebral arteries, and posterior inferior cerebellar arteries.   Electronically Signed   By: Jeannine Boga M.D.   On: 04/16/2014 00:54   Dg Chest Portable 1 View  04/15/2014   CLINICAL DATA:  One-day history of altered mental status, initial encounter.  EXAM: PORTABLE CHEST - 1 VIEW  COMPARISON:  11/20/2013  FINDINGS: 1638 hrs. Lungs are hyperexpanded The lungs are clear without focal infiltrate, edema, pneumothorax or pleural effusion. Cardiopericardial silhouette is at upper limits of normal for size. Imaged bony structures of the thorax are intact. Telemetry leads overlie the chest.  IMPRESSION: Hyperexpansion without acute cardiopulmonary findings.   Electronically Signed   By: Misty Stanley M.D.   On: 04/15/2014 16:50   Mr Jodene Nam Head/brain Wo Cm  04/16/2014   CLINICAL DATA:  Aphasia.  Evaluate for stroke.  Initial evaluation.  EXAM: MRI HEAD WITHOUT CONTRAST  MRA HEAD WITHOUT CONTRAST  TECHNIQUE: Multiplanar, multiecho pulse sequences of the brain and surrounding structures were obtained without intravenous contrast. Angiographic images of the head were obtained using MRA technique without contrast.  COMPARISON:  Prior CT from earlier the same day.  FINDINGS: MRI HEAD FINDINGS  Diffuse prominence of the CSF containing spaces is compatible with advanced generalized cerebral atrophy patchy and confluent T2/FLAIR hyperintensity within the periventricular and deep white matter both cerebral hemispheres most consistent with chronic small vessel ischemic disease. Overall, these findings have progressed relative to most recent MRI from 2012.  Multiple remote lacunar infarcts present within the basal ganglia bilaterally. Specifically, these involve the posterior limb  of the left internal capsule as well as the right thalamus. Probable remote left thalamic infarct present as well. Bilateral cerebellar infarcts present, left slightly worse than right. Encephalomalacia within the high bifrontal regions may reflect remote infarcts as well.  There is abnormal restricted diffusion involving the left parietotemporal  region, compatible with acute ischemic infarct. This region of infarct involves primarily the cortical gray matter (series 4, image 26). No associated hemorrhage or significant mass effect. There is localized gyral swelling within the infarcted territory.  Several additional small acute ischemic infarcts present within the right temporal and occipital lobes (series 4, image 16). The largest of these measures 5 mm in the right occipital lobe. Few additional subcentimeter infarcts involve the gray matter more superiorly within the right parietal region (series 4, image 27). Ischemic infarct involving the left frontal lobe appears to be slightly more subacute in nature (series 4, image 29). Possible embolic phenomenon is suspected given the various vascular distributions. No associated hemorrhage.  No mass lesion or midline shift. No extra-axial fluid collection. The ventricular prominence related to global parenchymal volume loss present without hydrocephalus.  Craniocervical junction within normal limits. Degenerative changes noted within the visualized upper cervical spine. Pituitary gland within normal limits. No acute abnormality seen about the orbits.  Paranasal sinuses are clear.  No mastoid effusion.  MRA HEAD FINDINGS  ANTERIOR CIRCULATION:  In visualized portions of the distal cervical segments of the internal carotid arteries are widely patent bilaterally. The petrous and cavernous segments are widely patent. There is mild multi focal narrowing of the supra clinoid segments bilaterally, left greater than right. A1 segments widely patent. Anterior communicating artery normal. Anterior cerebral arteries well opacified.  Mild multi focal atherosclerotic irregularity present within the M1 segments without hemodynamically significant stenosis. The right M1 segment appears to be widely patent. These changes are greater within the left MCA. Mild diffuse atherosclerotic irregularity seen within the distal MCA  branches.  POSTERIOR CIRCULATION:  Left vertebral artery is dominant. There is moderate multi focal narrowing of the distal right vertebral artery (series 6, image 132). Posterior inferior cerebral arteries patent bilaterally with multi focal atherosclerotic irregularity. Vertebrobasilar junction normal. Mild irregularity present within the basilar artery without focal high-grade stenosis.  Multi focal atherosclerotic irregularity present within the superior cerebellar arteries without occlusion or hemodynamically significant stenosis. P1 segments patent bilaterally. There is moderate short-segment stenosis of the proximal P2 segments bilaterally. P2 segments are opacified distally, although the most distal aspects are poorly evaluated.  No aneurysm or vascular malformation.  IMPRESSION: MRI HEAD IMPRESSION:  1. Acute ischemic infarct involving the left parietotemporal region, with multiple additional smaller ischemic infarcts involving the left frontal lobe, bilateral parietal lobes, and right temporal-occipital region. Underlying embolic disease is suspected given the various vascular distributions. 2. Multiple remote ischemic infarcts involving the bilateral basal ganglia and cerebellar hemispheres, left greater than right. 3. Advanced cerebral atrophy with chronic small vessel ischemic disease. These changes have progressed relative to most recent MRI from 2012.  MRA HEAD IMPRESSION:  1. No proximal branch occlusion identified within the intracranial circulation. 2. Moderate short-segment stenoses within the proximal P2 segments bilaterally. 3. Mild narrowing of the supra clinoid aspect of the internal carotid arteries bilaterally, left greater than right. 4. Moderate multi focal atherosclerotic narrowing of the distal right vertebral artery. The left vertebral artery is dominant. 5. Multi focal atherosclerotic irregularity involving the M1 segments, distal MCA branches, superior cerebral arteries, and posterior  inferior cerebellar arteries.   Electronically  Signed   By: Jeannine Boga M.D.   On: 04/16/2014 00:54    Scheduled Meds: . carvedilol  3.125 mg Oral Daily  . donepezil  5 mg Oral QHS  . latanoprost  1 drop Both Eyes QHS  . lisinopril  2.5 mg Oral Daily  . rivaroxaban  20 mg Oral Q supper   Continuous Infusions: . sodium chloride 75 mL/hr at 04/15/14 1840    Time spent: 35 minutes  [facility]  [email or web address], password [email or web address] 04/16/2014, 11:53 AM  LOS: 1 day

## 2014-04-16 NOTE — Evaluation (Signed)
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 Unsure of pt's PLOF, as family not in session and pt with expressive difficulties and h/o prior CVAs. Feel pt will benefit from acute OT to increase independence with BADLs prior to d/c. Recommending CIR for rehab.     Follow Up Recommendations  CIR    Equipment Recommendations  Other (comment) (defer to next venue)    Recommendations for Other Services       Precautions / Restrictions Precautions Precautions: Fall Restrictions Weight Bearing Restrictions: No      Mobility Bed Mobility Overal bed mobility: Modified Independent             General bed mobility comments: sit to supine position  Transfers Overall transfer level: Needs assistance Equipment used: None Transfers: Sit to/from Stand Sit to Stand: Min guard                  ADL Overall ADL's : Needs assistance/impaired     Grooming: Wash/dry face;Wash/dry hands;Oral care;Applying deodorant;Minimal assistance;Standing   Upper Body Bathing: Min guard;Standing   Lower Body Bathing: Min guard;Sit to/from stand   Upper Body Dressing : Standing;Minimal assistance   Lower Body Dressing: Sit to/from stand;Minimal assistance   Toilet Transfer: Minimal assistance;Ambulation;Comfort height toilet   Toileting- Clothing Manipulation and Hygiene: Minimal assistance (standing)       Functional mobility during ADLs: Minimal assistance General ADL Comments: Pt performed grooming/bathing at sink. Pt  ambulated to bathroom and performed toileting. Pt with apparent cognitive/speech deficits. Pt appeared to have difficulty putting deodorant under right arm using LUE, so OT assisted, but when given a wash cloth and told to wash under arms, pt able to use left hand to reach across and wash under right arm.     Vision  Pt wears glasses. Reports no change from baseline. Difficult to assess vision due to impaired communication.                   Perception     Praxis      Pertinent Vitals/Pain Pain Assessment: No/denies pain     Hand Dominance Right   Extremity/Trunk Assessment Upper Extremity Assessment Upper Extremity Assessment: Difficult to assess due to impaired cognition/communication (pt inconsistent following commands)   Lower Extremity Assessment Lower Extremity Assessment: Defer to PT evaluation LLE Deficits / Details: strength hip flexion, knee extension, ankle DF 4/5 (from prior CVA pt indicates)   Cervical / Trunk Assessment Cervical / Trunk Assessment: Normal   Communication Communication Communication: Expressive difficulties   Cognition Arousal/Alertness: Awake/alert Behavior During Therapy: WFL for tasks assessed/performed Overall Cognitive Status: Difficult to assess due to impaired communication; pt with history of dementia   Area of Impairment: Following commands;Problem solving       Following Commands: Follows one step commands inconsistently;Follows one step commands with increased time     Problem Solving: Slow processing;Requires verbal cues     General Comments       Exercises       Shoulder Instructions      Home Living Family/patient expects  to be discharged to:: Unsure                                 Additional Comments: pt with expressive difficulties and no family present      Prior Functioning/Environment          Comments: pt with expressive difficulties and no family present    OT Diagnosis: Other  (comment);Generalized weakness (decreased balance)   OT Problem List: Decreased knowledge of precautions;Decreased knowledge of use of DME or AE;Decreased cognition;Impaired balance (sitting and/or standing)   OT Treatment/Interventions: Self-care/ADL training;Therapeutic exercise;Neuromuscular education;DME and/or AE instruction;Therapeutic activities;Cognitive remediation/compensation;Patient/family education;Balance training;Visual/perceptual remediation/compensation    OT Goals(Current goals can be found in the care plan section) Acute Rehab OT Goals Patient Stated Goal: unable to state OT Goal Formulation: With patient Time For Goal Achievement: 04/23/14 Potential to Achieve Goals: Good ADL Goals Pt Will Perform Grooming: with set-up;standing Pt Will Perform Upper Body Dressing: with set-up;sitting;standing Pt Will Perform Lower Body Dressing: with set-up;with supervision;sit to/from stand Pt Will Transfer to Toilet: with supervision;ambulating Pt Will Perform Toileting - Clothing Manipulation and hygiene: with supervision;sit to/from stand  OT Frequency: Min 2X/week   Barriers to D/C:            Co-evaluation              End of Session Equipment Utilized During Treatment: Gait belt  Activity Tolerance: Patient tolerated treatment well Patient left: in bed;with call bell/phone within reach;with bed alarm set   Time: 1216-1242 OT Time Calculation (min): 26 min Charges:  OT General Charges $OT Visit: 1 Procedure OT Evaluation $Initial OT Evaluation Tier I: 1 Procedure OT Treatments $Self Care/Home Management : 8-22 mins G-CodesBenito Mccreedy OTR/L [phone] 04/16/2014, 1:08 PM

## 2014-04-16 NOTE — Progress Notes (Signed)
VASCULAR LAB PRELIMINARY  PRELIMINARY  PRELIMINARY  PRELIMINARY  Carotid Dopplers completed.    Preliminary report:  1-39% ICA stenosis.  Vertebral artery flow is antegrade.  Lenord Fralix, RVT 04/16/2014, 11:30 AM

## 2014-04-16 NOTE — Evaluation (Signed)
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 Pt with expressive difficulties and h/o prior CVAs, therefore difficult to know pt's baseline functional status (however pt reports he is not back to normal). Pt currently with functional limitations due to the deficits listed below (see PT Problem List).  Pt will benefit from skilled PT to increase their independence and safety with mobility to allow discharge to the venue listed below.       Follow Up Recommendations CIR;Supervision/Assistance - 24 hour    Equipment Recommendations  None recommended by PT    Recommendations for Other Services OT consult;Speech consult     Precautions / Restrictions Precautions Precautions: Fall      Mobility  Bed Mobility Overal bed mobility: Independent                Transfers Overall transfer level: Needs assistance Equipment used: None Transfers: Sit to/from Stand Sit to Stand: Min guard         General transfer comment: minguard for safety; no LOB  Ambulation/Gait Ambulation/Gait assistance: Min assist Ambulation Distance (Feet): 150 Feet Assistive device: None Gait Pattern/deviations: Step-through pattern;Decreased stride length;Decreased weight shift to right;Decreased weight shift to left;Wide base of support Gait velocity: able to slightly incr velocity; could not decr appreciably Gait velocity interpretation: Below normal speed for age/gender General Gait Details: slightly guarded with wide BOS, decr arm swing  bil; decr weight shift bil; no overt LOB; pt adamantly wanting to return to room after ~70 feet (unclear why  due to aphasia)  Stairs            Wheelchair Mobility    Modified Rankin (Stroke Patients Only) Modified Rankin (Stroke Patients Only) Pre-Morbid Rankin Score: Moderate disability (per pt; however expressive difficulties ? accuracy) Modified Rankin: Moderately severe disability     Balance Overall balance assessment: Needs assistance Sitting-balance support: No upper extremity supported;Feet supported Sitting balance-Leahy Scale: Good Sitting balance - Comments: > good not assessed   Standing balance support: No upper extremity supported Standing balance-Leahy Scale: Good Standing balance comment: guarded; appears anxious               High Level Balance Comments: feet shoulder width eyes closed 30 sec with less than normal sway; pt becomes slightly agitated and refuses full balance assessment             Pertinent Vitals/Pain Pain Assessment: No/denies pain    Home Living Family/patient expects to be discharged to:: Unsure                 Additional Comments: pt with expressive difficulties and no family present    Prior Function           Comments: pt with expressive difficulties and no family present     Hand Dominance   Dominant Hand: Right    Extremity/Trunk Assessment   Upper Extremity Assessment: Defer to OT evaluation;Overall [facility] for tasks assessed           Lower Extremity Assessment: LLE deficits/detail (RLE strength 5/5; coordination grossly intact)  LLE Deficits / Details: strength hip flexion, knee extension, ankle DF 4/5 (from prior CVA pt indicates)  Cervical / Trunk Assessment: Normal  Communication   Communication: Expressive difficulties (following pure verbal commands; receptively appears OK)  Cognition Arousal/Alertness: Awake/alert Behavior During Therapy: WFL for tasks assessed/performed Overall  Cognitive Status: Difficult to assess                      General Comments General comments (skin integrity, edema, etc.): Pt denies changes in vision however could not read individual letters or numbers (on door sign); ? expressive vs vision)    Exercises        Assessment/Plan    PT Assessment Patient needs continued PT services  PT Diagnosis Difficulty walking   PT Problem List Decreased strength;Decreased activity tolerance;Decreased balance;Decreased mobility;Decreased knowledge of use of DME;Decreased cognition;Decreased safety awareness;Decreased knowledge of precautions  PT Treatment Interventions DME instruction;Gait training;Stair training;Functional mobility training;Therapeutic activities;Balance training;Neuromuscular re-education;Cognitive remediation;Patient/family education   PT Goals (Current goals can be found in the Care Plan section) Acute Rehab PT Goals Patient Stated Goal: unable to state; agrees with PT goals PT Goal Formulation: With patient Time For Goal Achievement: 04/23/14 Potential to Achieve Goals: Good    Frequency Min 4X/week   Barriers to discharge  (unknown)      Co-evaluation               End of Session Equipment Utilized During Treatment: Gait belt Activity Tolerance: Patient limited by fatigue (pt adamantly returning to room) Patient left: in chair;with call bell/phone within reach;with chair alarm set Nurse Communication: Mobility status         Time: 8984-2103 PT Time Calculation (min) (ACUTE ONLY): 26 min   Charges:   PT Evaluation $Initial PT Evaluation Tier I: 1 Procedure PT Treatments $Gait Training: 8-22 mins   PT G Codes:        Neala Miggins 05/02/14, 10:32 AM Pager [phone]

## 2014-04-16 NOTE — Therapy (Signed)
SLP Cancellation Note  Unable to complete SLP evaluation/treatment secondary to pt currently unavailable. Will continue efforts  Grant Lara B. [facility], [place] [phone]

## 2014-04-16 NOTE — Progress Notes (Addendum)
SUBJECTIVE: Denies chest pain. Had 17-beat run of ventricular tachycardia at roughly 4 am this morning. Only complaint is headache.      Intake/Output Summary (Last 24 hours) at 04/16/14 0842 Last data filed at 04/15/14 1658  Gross per 24 hour  Intake      0 ml  Output    150 ml  Net   -150 ml    Current Facility-Administered Medications  Medication Dose Route Frequency Provider Last Rate Last Dose  . 0.9 %  sodium chloride infusion   Intravenous Continuous Geradine Girt, DO 75 mL/hr at 04/15/14 1840    . carvedilol (COREG) tablet 3.125 mg  3.125 mg Oral Daily Geradine Girt, DO      . donepezil (ARICEPT) tablet 5 mg  5 mg Oral QHS Geradine Girt, DO   5 mg at 04/15/14 2207  . latanoprost (XALATAN) 0.005 % ophthalmic solution 1 drop  1 drop Both Eyes QHS Geradine Girt, DO   1 drop at 04/15/14 2207  . rivaroxaban (XARELTO) tablet 20 mg  20 mg Oral Q supper Geradine Girt, DO      . senna-docusate (Senokot-S) tablet 1 tablet  1 tablet Oral QHS PRN Geradine Girt, DO        Filed Vitals:   04/16/14 0050 04/16/14 0238 04/16/14 0404 04/16/14 0616  BP: 129/63 142/69 139/65 143/73  Pulse: 64 61 61 64  Temp: 98.8 F (37.1 C) 99.1 F (37.3 C) 98.1 F (36.7 C) 98.8 F (37.1 C)  TempSrc: Oral Oral Oral Oral  Resp: 16 16 18 16   Weight:      SpO2: 97% 96% 95% 98%    PHYSICAL EXAM General: NAD. Resting comfortably. HEENT: Normal. Neck: No JVD, no thyromegaly.  Lungs: Clear to auscultation bilaterally with normal respiratory effort. CV: Nondisplaced PMI.  Regular rate and rhythm, normal S1/S2, no S3/S4, no murmur.  No pretibial edema.   Abdomen: Soft, nontender, no distention.  Neurologic: Alert. Expressive aphasia.  Psych: Normal affect. Musculoskeletal: No gross deformities. Extremities: No clubbing or cyanosis.   TELEMETRY: Reviewed telemetry pt in sinus rhythm. 17-beat run of VT at approximately 4 am.  LABS: Basic Metabolic Panel:  Recent Labs   04/15/14 1619 04/15/14 1626  NA 138 139  K 4.6 4.6  CL 105 105  CO2 25  --   GLUCOSE 182* 185*  BUN 17 20  CREATININE 1.46* 1.60*  CALCIUM 9.1  --    Liver Function Tests:  Recent Labs  04/15/14 1619  AST 16  ALT 11  ALKPHOS 116  BILITOT 0.6  PROT 6.4  ALBUMIN 3.4*   No results for input(s): LIPASE, AMYLASE in the last 72 hours. CBC:  Recent Labs  04/15/14 1619 04/15/14 1626  WBC 4.8  --   NEUTROABS 2.6  --   HGB 14.3 15.6  HCT 42.9 46.0  MCV 92.1  --   PLT 138*  --    Cardiac Enzymes:  Recent Labs  04/15/14 1854 04/16/14 0030 04/16/14 0525  TROPONINI <0.03 0.03 0.03   BNP: Invalid input(s): POCBNP D-Dimer: No results for input(s): DDIMER in the last 72 hours. Hemoglobin A1C: No results for input(s): HGBA1C in the last 72 hours. Fasting Lipid Panel:  Recent Labs  04/16/14 0030  CHOL 149  HDL 48  LDLCALC 80  TRIG 104  CHOLHDL 3.1   Thyroid Function Tests: No results for input(s): TSH, T4TOTAL, T3FREE, THYROIDAB in the last 72 hours.  Invalid input(s): FREET3 Anemia Panel: No results for input(s): VITAMINB12, FOLATE, FERRITIN, TIBC, IRON, RETICCTPCT in the last 72 hours.  RADIOLOGY: Ct Head Wo Contrast  04/15/2014   CLINICAL DATA:  Ectasia.  Hypertension.  Coronary artery disease.  EXAM: CT HEAD WITHOUT CONTRAST  TECHNIQUE: Contiguous axial images were obtained from the base of the skull through the vertex without intravenous contrast.  COMPARISON:  07/17/2013  FINDINGS: Sinuses/Soft tissues: Cerumen in both external ear canals. Clear paranasal sinuses and mastoid air cells.  Intracranial: Advanced cerebral atrophy. Moderate low density in the periventricular white matter likely related to small vessel disease.  Remote left-sided cerebellar infarct. Bilateral basal ganglia lacunar infarcts which are also felt to be chronic. Cortically based infarct involving the right occipital lobe is not significantly changed.  No mass lesion, hemorrhage,  hydrocephalus, acute infarct, intra-axial, or extra-axial fluid collection.  IMPRESSION: 1.  No acute intracranial abnormality. 2. Advanced atrophy with remote infarcts and diffuse small vessel ischemic change. These results were called by telephone at the time of interpretation on 04/15/2014 at 4:20 pm to Dr. Armida Sans, who verbally acknowledged these results.   Electronically Signed   By: Abigail Miyamoto M.D.   On: 04/15/2014 16:20   [name]  04/16/2014   CLINICAL DATA:  Aphasia.  Evaluate for stroke.  Initial evaluation.  EXAM: MRI HEAD WITHOUT CONTRAST  MRA HEAD WITHOUT CONTRAST  TECHNIQUE: Multiplanar, multiecho pulse sequences of the brain and surrounding structures were obtained without intravenous contrast. Angiographic images of the head were obtained using MRA technique without contrast.  COMPARISON:  Prior CT from earlier the same day.  FINDINGS: MRI HEAD FINDINGS  Diffuse prominence of the CSF containing spaces is compatible with advanced generalized cerebral atrophy patchy and confluent T2/FLAIR hyperintensity within the periventricular and deep white matter both cerebral hemispheres most consistent with chronic small vessel ischemic disease. Overall, these findings have progressed relative to most recent MRI from 2012.  Multiple remote lacunar infarcts present within the basal ganglia bilaterally. Specifically, these involve the posterior limb of the left internal capsule as well as the right thalamus. Probable remote left thalamic infarct present as well. Bilateral cerebellar infarcts present, left slightly worse than right. Encephalomalacia within the high bifrontal regions may reflect remote infarcts as well.  There is abnormal restricted diffusion involving the left parietotemporal region, compatible with acute ischemic infarct. This region of infarct involves primarily the cortical gray matter (series 4, image 26). No associated hemorrhage or significant mass effect. There is localized  gyral swelling within the infarcted territory.  Several additional small acute ischemic infarcts present within the right temporal and occipital lobes (series 4, image 16). The largest of these measures 5 mm in the right occipital lobe. Few additional subcentimeter infarcts involve the gray matter more superiorly within the right parietal region (series 4, image 27). Ischemic infarct involving the left frontal lobe appears to be slightly more subacute in nature (series 4, image 29). Possible embolic phenomenon is suspected given the various vascular distributions. No associated hemorrhage.  No mass lesion or midline shift. No extra-axial fluid collection. The ventricular prominence related to global parenchymal volume loss present without hydrocephalus.  Craniocervical junction within normal limits. Degenerative changes noted within the visualized upper cervical spine. Pituitary gland within normal limits. No acute abnormality seen about the orbits.  Paranasal sinuses are clear.  No mastoid effusion.  MRA HEAD FINDINGS  ANTERIOR CIRCULATION:  In visualized portions of the distal cervical segments of the internal carotid arteries  are widely patent bilaterally. The petrous and cavernous segments are widely patent. There is mild multi focal narrowing of the supra clinoid segments bilaterally, left greater than right. A1 segments widely patent. Anterior communicating artery normal. Anterior cerebral arteries well opacified.  Mild multi focal atherosclerotic irregularity present within the M1 segments without hemodynamically significant stenosis. The right M1 segment appears to be widely patent. These changes are greater within the left MCA. Mild diffuse atherosclerotic irregularity seen within the distal MCA branches.  POSTERIOR CIRCULATION:  Left vertebral artery is dominant. There is moderate multi focal narrowing of the distal right vertebral artery (series 6, image 132). Posterior inferior cerebral arteries patent  bilaterally with multi focal atherosclerotic irregularity. Vertebrobasilar junction normal. Mild irregularity present within the basilar artery without focal high-grade stenosis.  Multi focal atherosclerotic irregularity present within the superior cerebellar arteries without occlusion or hemodynamically significant stenosis. P1 segments patent bilaterally. There is moderate short-segment stenosis of the proximal P2 segments bilaterally. P2 segments are opacified distally, although the most distal aspects are poorly evaluated.  No aneurysm or vascular malformation.  IMPRESSION: MRI HEAD IMPRESSION:  1. Acute ischemic infarct involving the left parietotemporal region, with multiple additional smaller ischemic infarcts involving the left frontal lobe, bilateral parietal lobes, and right temporal-occipital region. Underlying embolic disease is suspected given the various vascular distributions. 2. Multiple remote ischemic infarcts involving the bilateral basal ganglia and cerebellar hemispheres, left greater than right. 3. Advanced cerebral atrophy with chronic small vessel ischemic disease. These changes have progressed relative to most recent MRI from 2012.  MRA HEAD IMPRESSION:  1. No proximal branch occlusion identified within the intracranial circulation. 2. Moderate short-segment stenoses within the proximal P2 segments bilaterally. 3. Mild narrowing of the supra clinoid aspect of the internal carotid arteries bilaterally, left greater than right. 4. Moderate multi focal atherosclerotic narrowing of the distal right vertebral artery. The left vertebral artery is dominant. 5. Multi focal atherosclerotic irregularity involving the M1 segments, distal MCA branches, superior cerebral arteries, and posterior inferior cerebellar arteries.   Electronically Signed   By: Jeannine Boga M.D.   On: 04/16/2014 00:54   Dg Chest Portable 1 View  04/15/2014   CLINICAL DATA:  One-day history of altered mental status,  initial encounter.  EXAM: PORTABLE CHEST - 1 VIEW  COMPARISON:  11/20/2013  FINDINGS: 1638 hrs. Lungs are hyperexpanded The lungs are clear without focal infiltrate, edema, pneumothorax or pleural effusion. Cardiopericardial silhouette is at upper limits of normal for size. Imaged bony structures of the thorax are intact. Telemetry leads overlie the chest.  IMPRESSION: Hyperexpansion without acute cardiopulmonary findings.   Electronically Signed   By: Misty Stanley M.D.   On: 04/15/2014 16:50   Mr Jodene Nam Head/brain Wo Cm  04/16/2014   CLINICAL DATA:  Aphasia.  Evaluate for stroke.  Initial evaluation.  EXAM: MRI HEAD WITHOUT CONTRAST  MRA HEAD WITHOUT CONTRAST  TECHNIQUE: Multiplanar, multiecho pulse sequences of the brain and surrounding structures were obtained without intravenous contrast. Angiographic images of the head were obtained using MRA technique without contrast.  COMPARISON:  Prior CT from earlier the same day.  FINDINGS: MRI HEAD FINDINGS  Diffuse prominence of the CSF containing spaces is compatible with advanced generalized cerebral atrophy patchy and confluent T2/FLAIR hyperintensity within the periventricular and deep white matter both cerebral hemispheres most consistent with chronic small vessel ischemic disease. Overall, these findings have progressed relative to most recent MRI from 2012.  Multiple remote lacunar infarcts present within the basal ganglia bilaterally.  Specifically, these involve the posterior limb of the left internal capsule as well as the right thalamus. Probable remote left thalamic infarct present as well. Bilateral cerebellar infarcts present, left slightly worse than right. Encephalomalacia within the high bifrontal regions may reflect remote infarcts as well.  There is abnormal restricted diffusion involving the left parietotemporal region, compatible with acute ischemic infarct. This region of infarct involves primarily the cortical gray matter (series 4, image 26). No  associated hemorrhage or significant mass effect. There is localized gyral swelling within the infarcted territory.  Several additional small acute ischemic infarcts present within the right temporal and occipital lobes (series 4, image 16). The largest of these measures 5 mm in the right occipital lobe. Few additional subcentimeter infarcts involve the gray matter more superiorly within the right parietal region (series 4, image 27). Ischemic infarct involving the left frontal lobe appears to be slightly more subacute in nature (series 4, image 29). Possible embolic phenomenon is suspected given the various vascular distributions. No associated hemorrhage.  No mass lesion or midline shift. No extra-axial fluid collection. The ventricular prominence related to global parenchymal volume loss present without hydrocephalus.  Craniocervical junction within normal limits. Degenerative changes noted within the visualized upper cervical spine. Pituitary gland within normal limits. No acute abnormality seen about the orbits.  Paranasal sinuses are clear.  No mastoid effusion.  MRA HEAD FINDINGS  ANTERIOR CIRCULATION:  In visualized portions of the distal cervical segments of the internal carotid arteries are widely patent bilaterally. The petrous and cavernous segments are widely patent. There is mild multi focal narrowing of the supra clinoid segments bilaterally, left greater than right. A1 segments widely patent. Anterior communicating artery normal. Anterior cerebral arteries well opacified.  Mild multi focal atherosclerotic irregularity present within the M1 segments without hemodynamically significant stenosis. The right M1 segment appears to be widely patent. These changes are greater within the left MCA. Mild diffuse atherosclerotic irregularity seen within the distal MCA branches.  POSTERIOR CIRCULATION:  Left vertebral artery is dominant. There is moderate multi focal narrowing of the distal right vertebral artery  (series 6, image 132). Posterior inferior cerebral arteries patent bilaterally with multi focal atherosclerotic irregularity. Vertebrobasilar junction normal. Mild irregularity present within the basilar artery without focal high-grade stenosis.  Multi focal atherosclerotic irregularity present within the superior cerebellar arteries without occlusion or hemodynamically significant stenosis. P1 segments patent bilaterally. There is moderate short-segment stenosis of the proximal P2 segments bilaterally. P2 segments are opacified distally, although the most distal aspects are poorly evaluated.  No aneurysm or vascular malformation.  IMPRESSION: MRI HEAD IMPRESSION:  1. Acute ischemic infarct involving the left parietotemporal region, with multiple additional smaller ischemic infarcts involving the left frontal lobe, bilateral parietal lobes, and right temporal-occipital region. Underlying embolic disease is suspected given the various vascular distributions. 2. Multiple remote ischemic infarcts involving the bilateral basal ganglia and cerebellar hemispheres, left greater than right. 3. Advanced cerebral atrophy with chronic small vessel ischemic disease. These changes have progressed relative to most recent MRI from 2012.  MRA HEAD IMPRESSION:  1. No proximal branch occlusion identified within the intracranial circulation. 2. Moderate short-segment stenoses within the proximal P2 segments bilaterally. 3. Mild narrowing of the supra clinoid aspect of the internal carotid arteries bilaterally, left greater than right. 4. Moderate multi focal atherosclerotic narrowing of the distal right vertebral artery. The left vertebral artery is dominant. 5. Multi focal atherosclerotic irregularity involving the M1 segments, distal MCA branches, superior cerebral arteries, and posterior inferior  cerebellar arteries.   Electronically Signed   By: Jeannine Boga M.D.   On: 04/16/2014 00:54      ASSESSMENT AND PLAN: 1.  Abnormal ECG: I agree with Dr. Mare Ferrari that this represents LVH with repolarization changes, and I compared it to ECG's in 01/2014 and 11/2013, with noted similarities. 2. Ventricular tachycardia: Troponins have been normal. K normal on 1/1. I will check a serum magnesium. HR currently in high 50 to low 60 bpm range, thus no room to increase Coreg. Likely due to acute CVA rather than coronary ischemia. 3. CAD s/p prior PCI: Stable. Continue present therapy with Coreg. Not on ASA presumably because he is on Xarelto. 4. Chronic systolic heart failure: Currently euvolemic. No diuretic requirement. On Coreg. Will resume ACEI at low home dose. 5. Essential HTN: I will resume lisinopril at home dose of 2.5 mg daily. Continue Coreg. 6. History of pulmonary embolism: Continue Xarelto.   Kate Sable, M.D., F.A.C.C.

## 2014-04-16 NOTE — Progress Notes (Signed)
Notified by CCMD of patient having a 17 beat run of Vtach. Patient asymptomatic upon assessment. MD notified, no further orders given. Will continue to monitor patient closely.  Burnell Blanks, RN

## 2014-04-16 NOTE — Progress Notes (Signed)
STROKE TEAM PROGRESS NOTE   HISTORY Grant Lara is a 74 y.o. 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 Patient was last seen normal at 1400 hours today. He called his daughter at 57 and was noted to have trouble getting his words out. EMS was called to house and code stroke. On arrival he remained expressively aphasic with no other symptoms. tPA was not given due to being on Xeralto.   Date last known well: Date: 04/15/2014 Time last known well: Time: 14:00 tPA Given: No: on Xeralto     SUBJECTIVE (INTERVAL HISTORY) No family members present. The patient has a very flat affect. Expressive aphasia noted. When asked if he had been taking his Xarelto on a regular basis he stated that we would have to ask his wife.   OBJECTIVE Temp:  [97.7 F (36.5 C)-99.1 F (37.3 C)] 98.8 F (37.1 C) (01/02 0616) Pulse Rate:  [46-67] 64 (01/02 0616) Cardiac Rhythm:  [-] Normal sinus rhythm;Sinus bradycardia;Ventricular tachycardia (01/01 2000) Resp:  [11-18] 16 (01/02 0616) BP: (129-166)/(63-77) 143/73 mmHg (01/02 0616) SpO2:  [95 %-100 %] 98 % (01/02 0616) Weight:  [138 lb 8 oz (62.823 kg)] 138 lb 8 oz (62.823 kg) (01/01 1629)  No results for input(s): GLUCAP in the last 168 hours.  Recent Labs Lab 04/15/14 1619 04/15/14 1626  NA 138 139  K 4.6 4.6  CL 105 105  CO2 25  --   GLUCOSE 182* 185*  BUN 17 20  CREATININE 1.46* 1.60*  CALCIUM 9.1  --     Recent Labs Lab 04/15/14 1619  AST 16  ALT 11  ALKPHOS 116  BILITOT 0.6  PROT 6.4  ALBUMIN 3.4*    Recent Labs Lab 04/15/14 1619 04/15/14 1626  WBC 4.8  --   NEUTROABS 2.6  --   HGB 14.3 15.6  HCT 42.9 46.0  MCV 92.1  --   PLT 138*  --     Recent Labs Lab 04/15/14 1854 04/16/14 0030  TROPONINI <0.03 0.03    Recent Labs  04/15/14 1619  LABPROT 13.4  INR 1.01    Recent Labs  04/15/14 1700  COLORURINE YELLOW  LABSPEC 1.018  PHURINE 6.0  GLUCOSEU NEGATIVE  HGBUR NEGATIVE  BILIRUBINUR NEGATIVE   KETONESUR NEGATIVE  PROTEINUR NEGATIVE  UROBILINOGEN 0.2  NITRITE NEGATIVE  LEUKOCYTESUR NEGATIVE       Component Value Date/Time   CHOL 149 04/16/2014 0030   TRIG 104 04/16/2014 0030   HDL 48 04/16/2014 0030   CHOLHDL 3.1 04/16/2014 0030   VLDL 21 04/16/2014 0030   LDLCALC 80 04/16/2014 0030   Lab Results  Component Value Date   HGBA1C 10.7* 08/22/2013      Component Value Date/Time   LABOPIA NONE DETECTED 04/15/2014 1700   COCAINSCRNUR NONE DETECTED 04/15/2014 1700   LABBENZ NONE DETECTED 04/15/2014 1700   AMPHETMU NONE DETECTED 04/15/2014 1700   THCU NONE DETECTED 04/15/2014 1700   LABBARB NONE DETECTED 04/15/2014 1700     Recent Labs Lab 04/15/14 1619  ETH <5    Ct Head Wo Contrast 04/15/2014    1.  No acute intracranial abnormality.  2. Advanced atrophy with remote infarcts and diffuse small vessel ischemic change.     [name] 04/16/2014    MRI HEAD IMPRESSION:   1. Acute ischemic infarct involving the left parietotemporal region, with multiple additional smaller ischemic infarcts involving the left frontal lobe, bilateral parietal lobes, and right temporal-occipital region. Underlying  embolic disease is suspected given the various vascular distributions.  2. Multiple remote ischemic infarcts involving the bilateral basal ganglia and cerebellar hemispheres, left greater than right.  3. Advanced cerebral atrophy with chronic small vessel ischemic disease. These changes have progressed relative to most recent MRI from 2012.    MRA HEAD IMPRESSION:   1. No proximal branch occlusion identified within the intracranial circulation.  2. Moderate short-segment stenoses within the proximal P2 segments bilaterally.  3. Mild narrowing of the supra clinoid aspect of the internal carotid arteries bilaterally, left greater than right.  4. Moderate multi focal atherosclerotic narrowing of the distal right vertebral artery. The left vertebral artery is dominant.   5. Multi focal atherosclerotic irregularity involving the M1 segments, distal MCA branches, superior cerebral arteries, and posterior inferior cerebellar arteries.       Dg Chest Portable 1 View 04/15/2014    Hyperexpansion without acute cardiopulmonary findings.         PHYSICAL EXAM  Neurological Examination Mental Status: Alert . Speech fluent with evidence of expressive aphasia. Able to follow simple commands without difficulty. Cranial Nerves: II: Discs not visualized; Visual fields grossly normal, pupils equal, round, reactive to light and accommodation III,IV, VI: ptosis not present, extra-ocular motions intact bilaterally V,VII: smile symmetric, facial light touch sensation normal bilaterally VIII: hearing normal bilaterally IX,X: gag reflex present XI: bilateral shoulder shrug XII: midline tongue extension Motor: Strength 5 over 5 throughout. Tone and bulk:normal tone throughout; no atrophy noted Sensory:  light touch intact throughout, bilaterally Deep Tendon Reflexes: 1+ and symmetric throughout UE and no AJ Plantars: Right: downgoingLeft: downgoing Cerebellar: normal finger-to-nose, Gait: not tested due to safety   ASSESSMENT/PLAN Grant Lara is a 74 y.o. male with history of  hypertension, dilated cardiomyopathy, coronary artery disease, previous DVT, previous pulmonary embolus, hyperlipidemia, PFO, diabetes mellitus, previous stroke, and memory difficulties presenting with expressive aphasia. He did not receive IV t-PA as he was already on Xarelto.  Stroke:  Dominant infarct believed to be embolic.  Resultant  expressive aphasia  MRI  Acute ischemic infarct involving the left parietotemporal region with multiple smaller infarcts.  MRA  diffuse mild to moderate narrowing.  Carotid Doppler - Preliminary report: 1-39% ICA stenosis. Vertebral artery flow is antegrade.  2D Echo - pending  LDL 80  HgbA1c  pending  Eliquis for VTE prophylaxis  Diet heart healthy/carb modified with thin liquids  eliquis (apixaban) prior to admission, now on eliquis (apixaban)  Patient counseled to be compliant with his antithrombotic medications  Ongoing aggressive stroke risk factor management  Therapy recommendations:  Pending  Disposition:  Pending  Hypertension  Home meds: Coreg 3.125 mg daily and lisinopril 2.5 mg daily  Stable   Hyperlipidemia  Home meds:  No lipid lowering medications prior to admission  LDL 80, goal < 70  Add low-dose statin - Lipitor 10 mg daily  Continue statin at discharge  Diabetes  HgbA1c  pending goal < 7.0  Uncontrolled  Other Stroke Risk Factors  Advanced age  Cigarette smoker, advised to stop smoking  Hx stroke  Coronary artery disease   Other Active Problems  Need to verify compliance with Xarelto  Other Pertinent History  Cardiology following for dilated cardiomyopathy and coronary artery disease  Hospital day # [place] PA-C [facility] Pager [phone] 04/16/2014, 8:09 AM  Aphasia despite xarelto use ? Compliance. Leotis Pain    To contact Stroke Continuity provider, please refer to [email or web address] After hours,  contact General Neurology

## 2014-04-17 DIAGNOSIS — I517 Cardiomegaly: Secondary | ICD-10-CM

## 2014-04-17 DIAGNOSIS — N183 Chronic kidney disease, stage 3 (moderate): Secondary | ICD-10-CM

## 2014-04-17 DIAGNOSIS — I2510 Atherosclerotic heart disease of native coronary artery without angina pectoris: Secondary | ICD-10-CM | POA: Insufficient documentation

## 2014-04-17 DIAGNOSIS — I059 Rheumatic mitral valve disease, unspecified: Secondary | ICD-10-CM

## 2014-04-17 DIAGNOSIS — E785 Hyperlipidemia, unspecified: Secondary | ICD-10-CM

## 2014-04-17 LAB — GLUCOSE, CAPILLARY
GLUCOSE-CAPILLARY: 115 mg/dL — AB (ref 70–99)
Glucose-Capillary: 90 mg/dL (ref 70–99)
Glucose-Capillary: 92 mg/dL (ref 70–99)
Glucose-Capillary: 124 mg/dL — ABNORMAL HIGH (ref 70–99)

## 2014-04-17 LAB — BASIC METABOLIC PANEL
ANION GAP: 4 — AB (ref 5–15)
BUN: 18 mg/dL (ref 6–23)
CHLORIDE: 108 meq/L (ref 96–112)
CO2: 25 mmol/L (ref 19–32)
Calcium: 8.8 mg/dL (ref 8.4–10.5)
Creatinine, Ser: 1.39 mg/dL — ABNORMAL HIGH (ref 0.50–1.35)
GFR calc Af Amer: 56 mL/min — ABNORMAL LOW (ref >90)
GFR calc non Af Amer: 48 mL/min — ABNORMAL LOW (ref >90)
Glucose, Bld: 91 mg/dL (ref 70–99)
Potassium: 4.1 mmol/L (ref 3.5–5.1)
SODIUM: 137 mmol/L (ref 135–145)

## 2014-04-17 LAB — HEMOGLOBIN A1C
Hgb A1c MFr Bld: 6.5 % — ABNORMAL HIGH (ref <5.7)
MEAN PLASMA GLUCOSE: 140 mg/dL — AB (ref <117)

## 2014-04-17 MED ORDER — MAGNESIUM SULFATE 2 GM/50ML IV SOLN
2.0000 g | Freq: Once | INTRAVENOUS | Status: AC
  Administered 2014-04-17: 2 g via INTRAVENOUS
  Filled 2014-04-17: qty 50

## 2014-04-17 NOTE — Progress Notes (Signed)
  Echocardiogram 2D Echocardiogram has been performed.  Grant Lara 04/17/2014, 4:48 PM

## 2014-04-17 NOTE — Progress Notes (Signed)
STROKE TEAM PROGRESS NOTE   HISTORY Grant Lara is a 74 y.o. 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 Patient was last seen normal at 1400 hours today. He called his daughter at 54 and was noted to have trouble getting his words out. EMS was called to house and code stroke. On arrival he remained expressively aphasic with no other symptoms. tPA was not given due to being on Xeralto.   Date last known well: Date: 04/15/2014 Time last known well: Time: 14:00 tPA Given: No: on Xeralto     SUBJECTIVE (INTERVAL HISTORY) The patient's wife is here today. She is a former [employer] employee. She verifies that Grant Lara has not missed any doses of his Xarelto. She is currently on Coumadin and her husband has been on Coumadin in the past. She prefers Coumadin since it is less expensive than the Xarelto. I told her that we would discuss this and make a decision soon regarding his anticoagulation. In the meantime he remains on Xarelto. They are both hoping that he will be able to go to the [facility] inpatient rehabilitation center for further therapy.   OBJECTIVE Temp:  [97.3 F (36.3 C)-98.6 F (37 C)] 98.4 F (36.9 C) (01/03 0544) Pulse Rate:  [46-63] 63 (01/03 0544) Cardiac Rhythm:  [-] Sinus bradycardia (01/02 2010) Resp:  [16-20] 20 (01/03 0544) BP: (124-154)/(61-70) 124/70 mmHg (01/03 0544) SpO2:  [100 %] 100 % (01/03 0544)   Recent Labs Lab 04/16/14 1630 04/16/14 2126 04/17/14 0729  GLUCAP 75 98 90    Recent Labs Lab 04/15/14 1619 04/15/14 1626 04/16/14 0525 04/17/14 0435  NA 138 139  --  137  K 4.6 4.6  --  4.1  CL 105 105  --  108  CO2 25  --   --  25  GLUCOSE 182* 185*  --  91  BUN 17 20  --  18  CREATININE 1.46* 1.60*  --  1.39*  CALCIUM 9.1  --   --  8.8  MG  --   --  1.7  --     Recent Labs Lab 04/15/14 1619  AST 16  ALT 11  ALKPHOS 116  BILITOT 0.6  PROT 6.4  ALBUMIN 3.4*    Recent Labs Lab 04/15/14 1619  04/15/14 1626  WBC 4.8  --   NEUTROABS 2.6  --   HGB 14.3 15.6  HCT 42.9 46.0  MCV 92.1  --   PLT 138*  --     Recent Labs Lab 04/15/14 1854 04/16/14 0030 04/16/14 0525  TROPONINI <0.03 0.03 0.03    Recent Labs  04/15/14 1619  LABPROT 13.4  INR 1.01    Recent Labs  04/15/14 1700  COLORURINE YELLOW  LABSPEC 1.018  PHURINE 6.0  GLUCOSEU NEGATIVE  HGBUR NEGATIVE  BILIRUBINUR NEGATIVE  KETONESUR NEGATIVE  PROTEINUR NEGATIVE  UROBILINOGEN 0.2  NITRITE NEGATIVE  LEUKOCYTESUR NEGATIVE       Component Value Date/Time   CHOL 149 04/16/2014 0030   TRIG 104 04/16/2014 0030   HDL 48 04/16/2014 0030   CHOLHDL 3.1 04/16/2014 0030   VLDL 21 04/16/2014 0030   LDLCALC 80 04/16/2014 0030   Lab Results  Component Value Date   HGBA1C 10.7* 08/22/2013      Component Value Date/Time   LABOPIA NONE DETECTED 04/15/2014 1700   COCAINSCRNUR NONE DETECTED 04/15/2014 1700   LABBENZ NONE DETECTED 04/15/2014 1700   AMPHETMU NONE DETECTED 04/15/2014 1700  THCU NONE DETECTED 04/15/2014 1700   LABBARB NONE DETECTED 04/15/2014 1700     Recent Labs Lab 04/15/14 1619  ETH <5    Ct Head Wo Lara 04/15/2014    1.  No acute intracranial abnormality.  2. Advanced atrophy with remote infarcts and diffuse small vessel ischemic change.     Grant Lara 04/16/2014    MRI HEAD IMPRESSION:   1. Acute ischemic infarct involving the left parietotemporal region, with multiple additional smaller ischemic infarcts involving the left frontal lobe, bilateral parietal lobes, and right temporal-occipital region. Underlying embolic disease is suspected given the various vascular distributions.  2. Multiple remote ischemic infarcts involving the bilateral basal ganglia and cerebellar hemispheres, left greater than right.  3. Advanced cerebral atrophy with chronic small vessel ischemic disease. These changes have progressed relative to most recent MRI from 2012.    MRA HEAD  IMPRESSION:   1. No proximal branch occlusion identified within the intracranial circulation.  2. Moderate short-segment stenoses within the proximal P2 segments bilaterally.  3. Mild narrowing of the supra clinoid aspect of the internal carotid arteries bilaterally, left greater than right.  4. Moderate multi focal atherosclerotic narrowing of the distal right vertebral artery. The left vertebral artery is dominant.  5. Multi focal atherosclerotic irregularity involving the M1 segments, distal MCA branches, superior cerebral arteries, and posterior inferior cerebellar arteries.       Dg Chest Portable 1 View 04/15/2014    Hyperexpansion without acute cardiopulmonary findings.         PHYSICAL EXAM  Neurological Examination Mental Status: Alert . Significant aphasia and word finding difficulties. Able to follow simple commands without difficulty. Cranial Nerves: II: Discs not visualized; Visual fields grossly normal, pupils equal, round, reactive to light and accommodation III,IV, VI: ptosis not present, extra-ocular motions intact bilaterally V,VII: smile symmetric, facial light touch sensation normal bilaterally VIII: hearing normal bilaterally IX,X: gag reflex present XI: bilateral shoulder shrug XII: midline tongue extension Motor: Strength 5 over 5 throughout. Tone and bulk:normal tone throughout; no atrophy noted Sensory:  light touch intact throughout, bilaterally Deep Tendon Reflexes: 1+ and symmetric throughout UE and no AJ Plantars: Right: downgoingLeft: downgoing Cerebellar: normal finger-to-nose, Gait: not tested due to safety   ASSESSMENT/PLAN Grant Lara is a 74 y.o. male with history of  hypertension, dilated cardiomyopathy, coronary artery disease, previous DVT, previous pulmonary embolus, hyperlipidemia, PFO, diabetes mellitus, previous stroke, and memory difficulties presenting with expressive aphasia. He did not  receive IV t-PA as he was already on Xarelto.  Stroke:  Dominant infarct believed to be embolic.  Resultant  expressive aphasia  MRI  Acute ischemic infarct involving the left parietotemporal region with multiple smaller infarcts.  MRA  diffuse mild to moderate narrowing.  Carotid Doppler - Preliminary report: 1-39% ICA stenosis. Vertebral artery flow is antegrade.  2D Echo - pending  LDL 80  HgbA1c 6.5  Eliquis for VTE prophylaxis  Diet heart healthy/carb modified with thin liquids  eliquis (apixaban) prior to admission, now on eliquis (apixaban)  Patient counseled to be compliant with his antithrombotic medications  Ongoing aggressive stroke risk factor management  Therapy recommendations - CIR recommended  Disposition:  Pending  Hypertension  Home meds: Coreg 3.125 mg daily and lisinopril 2.5 mg daily  Stable   Hyperlipidemia  Home meds:  No lipid lowering medications prior to admission  LDL 80, goal < 70  Add low-dose statin - Lipitor 10 mg daily  Continue statin at discharge  Diabetes  HgbA1c  6.5 goal < 7.0  Uncontrolled  Other Stroke Risk Factors  Advanced age  Cigarette smoker, advised to stop smoking  Hx stroke  Coronary artery disease   Other Active Problems  Pt's wife verified compliance with Xarelto. She would prefer that the patient be on warfarin secondary to the high cost of Xarelto.  Other Pertinent History  Cardiology following for dilated cardiomyopathy and coronary artery disease  Hospital day # 2  Mikey Bussing PA-C [facility] Pager [phone] 04/17/2014, 8:45 AM  Aphasia despite xarelto use ? Compliance.  D/c planning  To contact Stroke Continuity provider, please refer to [email or web address] After hours, contact General Neurology

## 2014-04-17 NOTE — Progress Notes (Signed)
SUBJECTIVE: No complaints. No more episodes of NSVT. Denies chest pain and SOB.     Intake/Output Summary (Last 24 hours) at 04/17/14 1116 Last data filed at 04/17/14 0100  Gross per 24 hour  Intake    240 ml  Output      0 ml  Net    240 ml    Current Facility-Administered Medications  Medication Dose Route Frequency Provider Last Rate Last Dose  . acetaminophen (TYLENOL) tablet 650 mg  650 mg Oral Q6H PRN Delfina Redwood, MD   650 mg at 04/16/14 0902  . atorvastatin (LIPITOR) tablet 10 mg  10 mg Oral q1800 David L Rinehuls, PA-C   10 mg at 04/16/14 1811  . carvedilol (COREG) tablet 3.125 mg  3.125 mg Oral Daily Geradine Girt, DO   3.125 mg at 04/17/14 0942  . donepezil (ARICEPT) tablet 5 mg  5 mg Oral QHS Geradine Girt, DO   5 mg at 04/16/14 2206  . insulin aspart (novoLOG) injection 0-15 Units  0-15 Units Subcutaneous TID WC Delfina Redwood, MD   0 Units at 04/16/14 1301  . latanoprost (XALATAN) 0.005 % ophthalmic solution 1 drop  1 drop Both Eyes QHS Geradine Girt, DO   1 drop at 04/16/14 2206  . lisinopril (PRINIVIL,ZESTRIL) tablet 2.5 mg  2.5 mg Oral Daily Herminio Commons, MD   2.5 mg at 04/17/14 0942  . magnesium sulfate IVPB 2 g 50 mL  2 g Intravenous Once [employer], DO      . rivaroxaban (XARELTO) tablet 20 mg  20 mg Oral Q supper Geradine Girt, DO   20 mg at 04/16/14 1702  . senna-docusate (Senokot-S) tablet 1 tablet  1 tablet Oral QHS PRN Geradine Girt, DO        Filed Vitals:   04/16/14 2128 04/17/14 0053 04/17/14 0544 04/17/14 1018  BP: 148/68 131/68 124/70 120/69  Pulse: 57 53 63 67  Temp: 97.3 F (36.3 C) 98.6 F (37 C) 98.4 F (36.9 C) 98.3 F (36.8 C)  TempSrc: Oral Oral Oral Oral  Resp: 18 18 20 20   Weight:      SpO2: 100% 100% 100% 98%    PHYSICAL EXAM General: NAD. Resting comfortably. HEENT: Normal. Neck: No JVD, no thyromegaly.  Lungs: Clear to auscultation bilaterally with normal respiratory effort. CV:  Nondisplaced PMI. Regular rate and rhythm, normal S1/S2, no S3/S4, no murmur. No pretibial edema.  Abdomen: Soft, nontender, no distention.  Neurologic: Alert. Expressive aphasia.  Psych: Normal affect. Musculoskeletal: No gross deformities. Extremities: No clubbing or cyanosis.   TELEMETRY: Reviewed telemetry pt in sinus rhythm  LABS: Basic Metabolic Panel:  Recent Labs  04/15/14 1619 04/15/14 1626 04/16/14 0525 04/17/14 0435  NA 138 139  --  137  K 4.6 4.6  --  4.1  CL 105 105  --  108  CO2 25  --   --  25  GLUCOSE 182* 185*  --  91  BUN 17 20  --  18  CREATININE 1.46* 1.60*  --  1.39*  CALCIUM 9.1  --   --  8.8  MG  --   --  1.7  --    Liver Function Tests:  Recent Labs  04/15/14 1619  AST 16  ALT 11  ALKPHOS 116  BILITOT 0.6  PROT 6.4  ALBUMIN 3.4*   No results for input(s): LIPASE, AMYLASE in the last 72 hours. CBC:  Recent Labs  04/15/14 1619 04/15/14 1626  WBC 4.8  --   NEUTROABS 2.6  --   HGB 14.3 15.6  HCT 42.9 46.0  MCV 92.1  --   PLT 138*  --    Cardiac Enzymes:  Recent Labs  04/15/14 1854 04/16/14 0030 04/16/14 0525  TROPONINI <0.03 0.03 0.03   BNP: Invalid input(s): POCBNP D-Dimer: No results for input(s): DDIMER in the last 72 hours. Hemoglobin A1C: No results for input(s): HGBA1C in the last 72 hours. Fasting Lipid Panel:  Recent Labs  04/16/14 0030  CHOL 149  HDL 48  LDLCALC 80  TRIG 104  CHOLHDL 3.1   Thyroid Function Tests: No results for input(s): TSH, T4TOTAL, T3FREE, THYROIDAB in the last 72 hours.  Invalid input(s): FREET3 Anemia Panel: No results for input(s): VITAMINB12, FOLATE, FERRITIN, TIBC, IRON, RETICCTPCT in the last 72 hours.  RADIOLOGY: Ct Head Wo Contrast  04/15/2014   CLINICAL DATA:  Ectasia.  Hypertension.  Coronary artery disease.  EXAM: CT HEAD WITHOUT CONTRAST  TECHNIQUE: Contiguous axial images were obtained from the base of the skull through the vertex without intravenous contrast.   COMPARISON:  07/17/2013  FINDINGS: Sinuses/Soft tissues: Cerumen in both external ear canals. Clear paranasal sinuses and mastoid air cells.  Intracranial: Advanced cerebral atrophy. Moderate low density in the periventricular white matter likely related to small vessel disease.  Remote left-sided cerebellar infarct. Bilateral basal ganglia lacunar infarcts which are also felt to be chronic. Cortically based infarct involving the right occipital lobe is not significantly changed.  No mass lesion, hemorrhage, hydrocephalus, acute infarct, intra-axial, or extra-axial fluid collection.  IMPRESSION: 1.  No acute intracranial abnormality. 2. Advanced atrophy with remote infarcts and diffuse small vessel ischemic change. These results were called by telephone at the time of interpretation on 04/15/2014 at 4:20 pm to Dr. Armida Sans, who verbally acknowledged these results.   Electronically Signed   By: Abigail Miyamoto M.D.   On: 04/15/2014 16:20   [name]  04/16/2014   CLINICAL DATA:  Aphasia.  Evaluate for stroke.  Initial evaluation.  EXAM: MRI HEAD WITHOUT CONTRAST  MRA HEAD WITHOUT CONTRAST  TECHNIQUE: Multiplanar, multiecho pulse sequences of the brain and surrounding structures were obtained without intravenous contrast. Angiographic images of the head were obtained using MRA technique without contrast.  COMPARISON:  Prior CT from earlier the same day.  FINDINGS: MRI HEAD FINDINGS  Diffuse prominence of the CSF containing spaces is compatible with advanced generalized cerebral atrophy patchy and confluent T2/FLAIR hyperintensity within the periventricular and deep white matter both cerebral hemispheres most consistent with chronic small vessel ischemic disease. Overall, these findings have progressed relative to most recent MRI from 2012.  Multiple remote lacunar infarcts present within the basal ganglia bilaterally. Specifically, these involve the posterior limb of the left internal capsule as well as the  right thalamus. Probable remote left thalamic infarct present as well. Bilateral cerebellar infarcts present, left slightly worse than right. Encephalomalacia within the high bifrontal regions may reflect remote infarcts as well.  There is abnormal restricted diffusion involving the left parietotemporal region, compatible with acute ischemic infarct. This region of infarct involves primarily the cortical gray matter (series 4, image 26). No associated hemorrhage or significant mass effect. There is localized gyral swelling within the infarcted territory.  Several additional small acute ischemic infarcts present within the right temporal and occipital lobes (series 4, image 16). The largest of these measures 5 mm in the right occipital lobe. Few  additional subcentimeter infarcts involve the gray matter more superiorly within the right parietal region (series 4, image 27). Ischemic infarct involving the left frontal lobe appears to be slightly more subacute in nature (series 4, image 29). Possible embolic phenomenon is suspected given the various vascular distributions. No associated hemorrhage.  No mass lesion or midline shift. No extra-axial fluid collection. The ventricular prominence related to global parenchymal volume loss present without hydrocephalus.  Craniocervical junction within normal limits. Degenerative changes noted within the visualized upper cervical spine. Pituitary gland within normal limits. No acute abnormality seen about the orbits.  Paranasal sinuses are clear.  No mastoid effusion.  MRA HEAD FINDINGS  ANTERIOR CIRCULATION:  In visualized portions of the distal cervical segments of the internal carotid arteries are widely patent bilaterally. The petrous and cavernous segments are widely patent. There is mild multi focal narrowing of the supra clinoid segments bilaterally, left greater than right. A1 segments widely patent. Anterior communicating artery normal. Anterior cerebral arteries well  opacified.  Mild multi focal atherosclerotic irregularity present within the M1 segments without hemodynamically significant stenosis. The right M1 segment appears to be widely patent. These changes are greater within the left MCA. Mild diffuse atherosclerotic irregularity seen within the distal MCA branches.  POSTERIOR CIRCULATION:  Left vertebral artery is dominant. There is moderate multi focal narrowing of the distal right vertebral artery (series 6, image 132). Posterior inferior cerebral arteries patent bilaterally with multi focal atherosclerotic irregularity. Vertebrobasilar junction normal. Mild irregularity present within the basilar artery without focal high-grade stenosis.  Multi focal atherosclerotic irregularity present within the superior cerebellar arteries without occlusion or hemodynamically significant stenosis. P1 segments patent bilaterally. There is moderate short-segment stenosis of the proximal P2 segments bilaterally. P2 segments are opacified distally, although the most distal aspects are poorly evaluated.  No aneurysm or vascular malformation.  IMPRESSION: MRI HEAD IMPRESSION:  1. Acute ischemic infarct involving the left parietotemporal region, with multiple additional smaller ischemic infarcts involving the left frontal lobe, bilateral parietal lobes, and right temporal-occipital region. Underlying embolic disease is suspected given the various vascular distributions. 2. Multiple remote ischemic infarcts involving the bilateral basal ganglia and cerebellar hemispheres, left greater than right. 3. Advanced cerebral atrophy with chronic small vessel ischemic disease. These changes have progressed relative to most recent MRI from 2012.  MRA HEAD IMPRESSION:  1. No proximal branch occlusion identified within the intracranial circulation. 2. Moderate short-segment stenoses within the proximal P2 segments bilaterally. 3. Mild narrowing of the supra clinoid aspect of the internal carotid  arteries bilaterally, left greater than right. 4. Moderate multi focal atherosclerotic narrowing of the distal right vertebral artery. The left vertebral artery is dominant. 5. Multi focal atherosclerotic irregularity involving the M1 segments, distal MCA branches, superior cerebral arteries, and posterior inferior cerebellar arteries.   Electronically Signed   By: Jeannine Boga M.D.   On: 04/16/2014 00:54   Dg Chest Portable 1 View  04/15/2014   CLINICAL DATA:  One-day history of altered mental status, initial encounter.  EXAM: PORTABLE CHEST - 1 VIEW  COMPARISON:  11/20/2013  FINDINGS: 1638 hrs. Lungs are hyperexpanded The lungs are clear without focal infiltrate, edema, pneumothorax or pleural effusion. Cardiopericardial silhouette is at upper limits of normal for size. Imaged bony structures of the thorax are intact. Telemetry leads overlie the chest.  IMPRESSION: Hyperexpansion without acute cardiopulmonary findings.   Electronically Signed   By: Misty Stanley M.D.   On: 04/15/2014 16:50   Mr Jodene Nam Head/brain Wo Cm  04/16/2014   CLINICAL DATA:  Aphasia.  Evaluate for stroke.  Initial evaluation.  EXAM: MRI HEAD WITHOUT CONTRAST  MRA HEAD WITHOUT CONTRAST  TECHNIQUE: Multiplanar, multiecho pulse sequences of the brain and surrounding structures were obtained without intravenous contrast. Angiographic images of the head were obtained using MRA technique without contrast.  COMPARISON:  Prior CT from earlier the same day.  FINDINGS: MRI HEAD FINDINGS  Diffuse prominence of the CSF containing spaces is compatible with advanced generalized cerebral atrophy patchy and confluent T2/FLAIR hyperintensity within the periventricular and deep white matter both cerebral hemispheres most consistent with chronic small vessel ischemic disease. Overall, these findings have progressed relative to most recent MRI from 2012.  Multiple remote lacunar infarcts present within the basal ganglia bilaterally. Specifically, these  involve the posterior limb of the left internal capsule as well as the right thalamus. Probable remote left thalamic infarct present as well. Bilateral cerebellar infarcts present, left slightly worse than right. Encephalomalacia within the high bifrontal regions may reflect remote infarcts as well.  There is abnormal restricted diffusion involving the left parietotemporal region, compatible with acute ischemic infarct. This region of infarct involves primarily the cortical gray matter (series 4, image 26). No associated hemorrhage or significant mass effect. There is localized gyral swelling within the infarcted territory.  Several additional small acute ischemic infarcts present within the right temporal and occipital lobes (series 4, image 16). The largest of these measures 5 mm in the right occipital lobe. Few additional subcentimeter infarcts involve the gray matter more superiorly within the right parietal region (series 4, image 27). Ischemic infarct involving the left frontal lobe appears to be slightly more subacute in nature (series 4, image 29). Possible embolic phenomenon is suspected given the various vascular distributions. No associated hemorrhage.  No mass lesion or midline shift. No extra-axial fluid collection. The ventricular prominence related to global parenchymal volume loss present without hydrocephalus.  Craniocervical junction within normal limits. Degenerative changes noted within the visualized upper cervical spine. Pituitary gland within normal limits. No acute abnormality seen about the orbits.  Paranasal sinuses are clear.  No mastoid effusion.  MRA HEAD FINDINGS  ANTERIOR CIRCULATION:  In visualized portions of the distal cervical segments of the internal carotid arteries are widely patent bilaterally. The petrous and cavernous segments are widely patent. There is mild multi focal narrowing of the supra clinoid segments bilaterally, left greater than right. A1 segments widely patent.  Anterior communicating artery normal. Anterior cerebral arteries well opacified.  Mild multi focal atherosclerotic irregularity present within the M1 segments without hemodynamically significant stenosis. The right M1 segment appears to be widely patent. These changes are greater within the left MCA. Mild diffuse atherosclerotic irregularity seen within the distal MCA branches.  POSTERIOR CIRCULATION:  Left vertebral artery is dominant. There is moderate multi focal narrowing of the distal right vertebral artery (series 6, image 132). Posterior inferior cerebral arteries patent bilaterally with multi focal atherosclerotic irregularity. Vertebrobasilar junction normal. Mild irregularity present within the basilar artery without focal high-grade stenosis.  Multi focal atherosclerotic irregularity present within the superior cerebellar arteries without occlusion or hemodynamically significant stenosis. P1 segments patent bilaterally. There is moderate short-segment stenosis of the proximal P2 segments bilaterally. P2 segments are opacified distally, although the most distal aspects are poorly evaluated.  No aneurysm or vascular malformation.  IMPRESSION: MRI HEAD IMPRESSION:  1. Acute ischemic infarct involving the left parietotemporal region, with multiple additional smaller ischemic infarcts involving the left frontal lobe, bilateral parietal lobes, and  right temporal-occipital region. Underlying embolic disease is suspected given the various vascular distributions. 2. Multiple remote ischemic infarcts involving the bilateral basal ganglia and cerebellar hemispheres, left greater than right. 3. Advanced cerebral atrophy with chronic small vessel ischemic disease. These changes have progressed relative to most recent MRI from 2012.  MRA HEAD IMPRESSION:  1. No proximal branch occlusion identified within the intracranial circulation. 2. Moderate short-segment stenoses within the proximal P2 segments bilaterally. 3.  Mild narrowing of the supra clinoid aspect of the internal carotid arteries bilaterally, left greater than right. 4. Moderate multi focal atherosclerotic narrowing of the distal right vertebral artery. The left vertebral artery is dominant. 5. Multi focal atherosclerotic irregularity involving the M1 segments, distal MCA branches, superior cerebral arteries, and posterior inferior cerebellar arteries.   Electronically Signed   By: Jeannine Boga M.D.   On: 04/16/2014 00:54      ASSESSMENT AND PLAN: 1. Abnormal ECG: I agree with Dr. Mare Ferrari that this represents LVH with repolarization changes, and I compared it to ECG's in 01/2014 and 11/2013, with noted similarities.  2. Ventricular tachycardia: No further episodes. Mg low normal at 1.7. Would aim to keep >2 to attenuate recurrences. Troponins have been normal. K normal on 1/1 and 4.2 today. HR remains in high 50 to low 60 bpm range, thus no room to increase Coreg. Likely due to acute CVA rather than coronary ischemia.  3. CAD s/p prior PCI: Stable. Continue present therapy with Coreg and statin. Not on ASA presumably because he is on Xarelto.  4. Chronic systolic heart failure: Currently euvolemic. No diuretic requirement. On Coreg and ACEI.  5. Essential HTN: Controlled on low-dose lisinopril at home dose of 2.5 mg daily. Continue Coreg.  6. History of pulmonary embolism: Continue Xarelto.  Dispo: No further recommendations. Will sign off.  Kate Sable, M.D., F.A.C.C.

## 2014-04-17 NOTE — Progress Notes (Signed)
[facility]                                                                              Patient Demographics  Grant Lara, is a 74 y.o. male, DOB - [birth year], VOJ:[id number]  Admit date - 04/15/2014   Admitting Physician Grant Girt, DO  Outpatient Primary MD for the patient is Grant Logan, MD  LOS - 2   Chief Complaint  Patient presents with  . Code Stroke      HPI on 04/15/2014 by Dr. Baldwin [place] Lara is a 74 y.o. male who was brought in as a code stroke. When his wife left him at 1400, he was ok. He then left a garbled message on his son's voicemail at 3:30. 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 He is currently having difficulties with is speech. Denies CP. He was not a candidate for TPA due to Xarelto. ? If patient has run out of xarelto,-- family not sure---, there appears to be a prior auth done on 12/30. And a message left by patient that he could not afford the xarelto on 12/22.  ER physician also felt he had EKG changes- denies CP. In the ER, his CT scan of head was negative for bleed. hospitalist were asked to admit  Assessment & Plan   Acute embolic stroke -MRI shows an acute ischemic infarct involving the left parietotemporal region with multiple smaller ischemic infarcts involving the left frontal lobe, bilateral parietal lobes, right temporal occipital region.  Underlying embolic disease is suspected -Continue Xarelto and statin -LDL 80, goal less than 70 -Neurology consulted and appreciated -Hemoglobin A1c pending -Echocardiogram pending -Carotid Doppler: 139% ICA stenosis, vertebral artery flow is antegrade -PT and OT consulted and recommended inpatient rehabilitation -PMR consulted  Essential hypertension -Stable, Continue Coreg, lisinopril  Chronic systolic heart failure -Appears compensated -Echocardiogram pending -Continue ACE inhibitor and Coreg -Continue to monitor daily weights and I/O  Recent pulmonary  embolism -Continue Xarelto  Diabetes mellitus, type II -Hemoglobin A1c pending -Continue insulin sliding scale CBG monitoring  Nonsustained ventricular tachycardia -Magnesium 1.7, will replace -Cardiology consulted and appreciated; likely secondary to Acute CVA rather than coronary ischemia  Chronic kidney disease, stage III -Creatinine appears to be at baseline  Abnormal EKG -No change from previous  Code Status: Full  Family Communication: None at bedside  Disposition Plan: Admitted. Pending CIR  Time Spent in minutes   30 minutes  Procedures  Echocardiogram Carotid Doppler  Consults   Neurology Cardiology  DVT Prophylaxis  Xarelto  Lab Results  Component Value Date   PLT 138* 04/15/2014    Medications  Scheduled Meds: . atorvastatin  10 mg Oral q1800  . carvedilol  3.125 mg Oral Daily  . donepezil  5 mg Oral QHS  . insulin aspart  0-15 Units Subcutaneous TID WC  . latanoprost  1 drop Both Eyes QHS  . lisinopril  2.5 mg Oral Daily  . rivaroxaban  20 mg Oral Q supper   Continuous Infusions:  PRN Meds:.acetaminophen, senna-docusate  Antibiotics    Anti-infectives    None        Subjective:   Grant Lara  seen and examined today.  Patient has no complaints today. Denies any chest pain, shortness of breath, headache, dizziness.  Objective:   Filed Vitals:   04/16/14 1727 04/16/14 2128 04/17/14 0053 04/17/14 0544  BP: 154/64 148/68 131/68 124/70  Pulse: 46 57 53 63  Temp: 97.9 F (36.6 C) 97.3 F (36.3 C) 98.6 F (37 C) 98.4 F (36.9 C)  TempSrc: Oral Oral Oral Oral  Resp: 16 18 18 20   Weight:      SpO2: 100% 100% 100% 100%    Wt Readings from Last 3 Encounters:  04/15/14 62.823 kg (138 lb 8 oz)  02/07/14 62.143 kg (137 lb)  11/22/13 61.78 kg (136 lb 3.2 oz)     Intake/Output Summary (Last 24 hours) at 04/17/14 1016 Last data filed at 04/17/14 1062  Gross per 24 hour  Intake    240 ml  Output      0 ml  Net    240 ml     Exam  General: Well developed, well nourished, NAD, appears stated age  HEENT: NCAT,  mucous membranes moist.   Cardiovascular: S1 S2 auscultated, no rubs, murmurs or gallops. Regular rate and rhythm.  Respiratory: Clear to auscultation bilaterally with equal chest rise  Abdomen: Soft, nontender, nondistended, + bowel sounds  Extremities: warm dry without cyanosis clubbing or edema  Neuro: AAOx3, nonfocal however, some hesitation with word finding  Psych: Appropriate  Data Review   Micro Results No results found for this or any previous visit (from the past 240 hour(s)).  Radiology Reports Ct Head Wo Contrast  04/15/2014   CLINICAL DATA:  Ectasia.  Hypertension.  Coronary artery disease.  EXAM: CT HEAD WITHOUT CONTRAST  TECHNIQUE: Contiguous axial images were obtained from the base of the skull through the vertex without intravenous contrast.  COMPARISON:  07/17/2013  FINDINGS: Sinuses/Soft tissues: Cerumen in both external ear canals. Clear paranasal sinuses and mastoid air cells.  Intracranial: Advanced cerebral atrophy. Moderate low density in the periventricular white matter likely related to small vessel disease.  Remote left-sided cerebellar infarct. Bilateral basal ganglia lacunar infarcts which are also felt to be chronic. Cortically based infarct involving the right occipital lobe is not significantly changed.  No mass lesion, hemorrhage, hydrocephalus, acute infarct, intra-axial, or extra-axial fluid collection.  IMPRESSION: 1.  No acute intracranial abnormality. 2. Advanced atrophy with remote infarcts and diffuse small vessel ischemic change. These results were called by telephone at the time of interpretation on 04/15/2014 at 4:20 pm to Dr. Armida Lara, who verbally acknowledged these results.   Electronically Signed   By: Grant Lara M.D.   On: 04/15/2014 16:20   [name]  04/16/2014   CLINICAL DATA:  Aphasia.  Evaluate for stroke.  Initial evaluation.  EXAM: MRI  HEAD WITHOUT CONTRAST  MRA HEAD WITHOUT CONTRAST  TECHNIQUE: Multiplanar, multiecho pulse sequences of the brain and surrounding structures were obtained without intravenous contrast. Angiographic images of the head were obtained using MRA technique without contrast.  COMPARISON:  Prior CT from earlier the same day.  FINDINGS: MRI HEAD FINDINGS  Diffuse prominence of the CSF containing spaces is compatible with advanced generalized cerebral atrophy patchy and confluent T2/FLAIR hyperintensity within the periventricular and deep white matter both cerebral hemispheres most consistent with chronic small vessel ischemic disease. Overall, these findings have progressed relative to most recent MRI from 2012.  Multiple remote lacunar infarcts present within the basal ganglia bilaterally. Specifically, these involve the posterior limb of the left internal  capsule as well as the right thalamus. Probable remote left thalamic infarct present as well. Bilateral cerebellar infarcts present, left slightly worse than right. Encephalomalacia within the high bifrontal regions may reflect remote infarcts as well.  There is abnormal restricted diffusion involving the left parietotemporal region, compatible with acute ischemic infarct. This region of infarct involves primarily the cortical gray matter (series 4, image 26). No associated hemorrhage or significant mass effect. There is localized gyral swelling within the infarcted territory.  Several additional small acute ischemic infarcts present within the right temporal and occipital lobes (series 4, image 16). The largest of these measures 5 mm in the right occipital lobe. Few additional subcentimeter infarcts involve the gray matter more superiorly within the right parietal region (series 4, image 27). Ischemic infarct involving the left frontal lobe appears to be slightly more subacute in nature (series 4, image 29). Possible embolic phenomenon is suspected given the various  vascular distributions. No associated hemorrhage.  No mass lesion or midline shift. No extra-axial fluid collection. The ventricular prominence related to global parenchymal volume loss present without hydrocephalus.  Craniocervical junction within normal limits. Degenerative changes noted within the visualized upper cervical spine. Pituitary gland within normal limits. No acute abnormality seen about the orbits.  Paranasal sinuses are clear.  No mastoid effusion.  MRA HEAD FINDINGS  ANTERIOR CIRCULATION:  In visualized portions of the distal cervical segments of the internal carotid arteries are widely patent bilaterally. The petrous and cavernous segments are widely patent. There is mild multi focal narrowing of the supra clinoid segments bilaterally, left greater than right. A1 segments widely patent. Anterior communicating artery normal. Anterior cerebral arteries well opacified.  Mild multi focal atherosclerotic irregularity present within the M1 segments without hemodynamically significant stenosis. The right M1 segment appears to be widely patent. These changes are greater within the left MCA. Mild diffuse atherosclerotic irregularity seen within the distal MCA branches.  POSTERIOR CIRCULATION:  Left vertebral artery is dominant. There is moderate multi focal narrowing of the distal right vertebral artery (series 6, image 132). Posterior inferior cerebral arteries patent bilaterally with multi focal atherosclerotic irregularity. Vertebrobasilar junction normal. Mild irregularity present within the basilar artery without focal high-grade stenosis.  Multi focal atherosclerotic irregularity present within the superior cerebellar arteries without occlusion or hemodynamically significant stenosis. P1 segments patent bilaterally. There is moderate short-segment stenosis of the proximal P2 segments bilaterally. P2 segments are opacified distally, although the most distal aspects are poorly evaluated.  No aneurysm or  vascular malformation.  IMPRESSION: MRI HEAD IMPRESSION:  1. Acute ischemic infarct involving the left parietotemporal region, with multiple additional smaller ischemic infarcts involving the left frontal lobe, bilateral parietal lobes, and right temporal-occipital region. Underlying embolic disease is suspected given the various vascular distributions. 2. Multiple remote ischemic infarcts involving the bilateral basal ganglia and cerebellar hemispheres, left greater than right. 3. Advanced cerebral atrophy with chronic small vessel ischemic disease. These changes have progressed relative to most recent MRI from 2012.  MRA HEAD IMPRESSION:  1. No proximal branch occlusion identified within the intracranial circulation. 2. Moderate short-segment stenoses within the proximal P2 segments bilaterally. 3. Mild narrowing of the supra clinoid aspect of the internal carotid arteries bilaterally, left greater than right. 4. Moderate multi focal atherosclerotic narrowing of the distal right vertebral artery. The left vertebral artery is dominant. 5. Multi focal atherosclerotic irregularity involving the M1 segments, distal MCA branches, superior cerebral arteries, and posterior inferior cerebellar arteries.   Electronically Signed   By: [name]  Jeannine Boga M.D.   On: 04/16/2014 00:54   Dg Chest Portable 1 View  04/15/2014   CLINICAL DATA:  One-day history of altered mental status, initial encounter.  EXAM: PORTABLE CHEST - 1 VIEW  COMPARISON:  11/20/2013  FINDINGS: 1638 hrs. Lungs are hyperexpanded The lungs are clear without focal infiltrate, edema, pneumothorax or pleural effusion. Cardiopericardial silhouette is at upper limits of normal for size. Imaged bony structures of the thorax are intact. Telemetry leads overlie the chest.  IMPRESSION: Hyperexpansion without acute cardiopulmonary findings.   Electronically Signed   By: Misty Stanley M.D.   On: 04/15/2014 16:50   Mr Jodene Nam Head/brain Wo Cm  04/16/2014   CLINICAL  DATA:  Aphasia.  Evaluate for stroke.  Initial evaluation.  EXAM: MRI HEAD WITHOUT CONTRAST  MRA HEAD WITHOUT CONTRAST  TECHNIQUE: Multiplanar, multiecho pulse sequences of the brain and surrounding structures were obtained without intravenous contrast. Angiographic images of the head were obtained using MRA technique without contrast.  COMPARISON:  Prior CT from earlier the same day.  FINDINGS: MRI HEAD FINDINGS  Diffuse prominence of the CSF containing spaces is compatible with advanced generalized cerebral atrophy patchy and confluent T2/FLAIR hyperintensity within the periventricular and deep white matter both cerebral hemispheres most consistent with chronic small vessel ischemic disease. Overall, these findings have progressed relative to most recent MRI from 2012.  Multiple remote lacunar infarcts present within the basal ganglia bilaterally. Specifically, these involve the posterior limb of the left internal capsule as well as the right thalamus. Probable remote left thalamic infarct present as well. Bilateral cerebellar infarcts present, left slightly worse than right. Encephalomalacia within the high bifrontal regions may reflect remote infarcts as well.  There is abnormal restricted diffusion involving the left parietotemporal region, compatible with acute ischemic infarct. This region of infarct involves primarily the cortical gray matter (series 4, image 26). No associated hemorrhage or significant mass effect. There is localized gyral swelling within the infarcted territory.  Several additional small acute ischemic infarcts present within the right temporal and occipital lobes (series 4, image 16). The largest of these measures 5 mm in the right occipital lobe. Few additional subcentimeter infarcts involve the gray matter more superiorly within the right parietal region (series 4, image 27). Ischemic infarct involving the left frontal lobe appears to be slightly more subacute in nature (series 4, image  29). Possible embolic phenomenon is suspected given the various vascular distributions. No associated hemorrhage.  No mass lesion or midline shift. No extra-axial fluid collection. The ventricular prominence related to global parenchymal volume loss present without hydrocephalus.  Craniocervical junction within normal limits. Degenerative changes noted within the visualized upper cervical spine. Pituitary gland within normal limits. No acute abnormality seen about the orbits.  Paranasal sinuses are clear.  No mastoid effusion.  MRA HEAD FINDINGS  ANTERIOR CIRCULATION:  In visualized portions of the distal cervical segments of the internal carotid arteries are widely patent bilaterally. The petrous and cavernous segments are widely patent. There is mild multi focal narrowing of the supra clinoid segments bilaterally, left greater than right. A1 segments widely patent. Anterior communicating artery normal. Anterior cerebral arteries well opacified.  Mild multi focal atherosclerotic irregularity present within the M1 segments without hemodynamically significant stenosis. The right M1 segment appears to be widely patent. These changes are greater within the left MCA. Mild diffuse atherosclerotic irregularity seen within the distal MCA branches.  POSTERIOR CIRCULATION:  Left vertebral artery is dominant. There is moderate multi focal narrowing of the distal  right vertebral artery (series 6, image 132). Posterior inferior cerebral arteries patent bilaterally with multi focal atherosclerotic irregularity. Vertebrobasilar junction normal. Mild irregularity present within the basilar artery without focal high-grade stenosis.  Multi focal atherosclerotic irregularity present within the superior cerebellar arteries without occlusion or hemodynamically significant stenosis. P1 segments patent bilaterally. There is moderate short-segment stenosis of the proximal P2 segments bilaterally. P2 segments are opacified distally,  although the most distal aspects are poorly evaluated.  No aneurysm or vascular malformation.  IMPRESSION: MRI HEAD IMPRESSION:  1. Acute ischemic infarct involving the left parietotemporal region, with multiple additional smaller ischemic infarcts involving the left frontal lobe, bilateral parietal lobes, and right temporal-occipital region. Underlying embolic disease is suspected given the various vascular distributions. 2. Multiple remote ischemic infarcts involving the bilateral basal ganglia and cerebellar hemispheres, left greater than right. 3. Advanced cerebral atrophy with chronic small vessel ischemic disease. These changes have progressed relative to most recent MRI from 2012.  MRA HEAD IMPRESSION:  1. No proximal branch occlusion identified within the intracranial circulation. 2. Moderate short-segment stenoses within the proximal P2 segments bilaterally. 3. Mild narrowing of the supra clinoid aspect of the internal carotid arteries bilaterally, left greater than right. 4. Moderate multi focal atherosclerotic narrowing of the distal right vertebral artery. The left vertebral artery is dominant. 5. Multi focal atherosclerotic irregularity involving the M1 segments, distal MCA branches, superior cerebral arteries, and posterior inferior cerebellar arteries.   Electronically Signed   By: Jeannine Boga M.D.   On: 04/16/2014 00:54    CBC  Recent Labs Lab 04/15/14 1619 04/15/14 1626  WBC 4.8  --   HGB 14.3 15.6  HCT 42.9 46.0  PLT 138*  --   MCV 92.1  --   MCH 30.7  --   MCHC 33.3  --   RDW 13.0  --   LYMPHSABS 1.7  --   MONOABS 0.4  --   EOSABS 0.1  --   BASOSABS 0.0  --     Chemistries   Recent Labs Lab 04/15/14 1619 04/15/14 1626 04/16/14 0525 04/17/14 0435  NA 138 139  --  137  K 4.6 4.6  --  4.1  CL 105 105  --  108  CO2 25  --   --  25  GLUCOSE 182* 185*  --  91  BUN 17 20  --  18  CREATININE 1.46* 1.60*  --  1.39*  CALCIUM 9.1  --   --  8.8  MG  --   --  1.7   --   AST 16  --   --   --   ALT 11  --   --   --   ALKPHOS 116  --   --   --   BILITOT 0.6  --   --   --    ------------------------------------------------------------------------------------------------------------------ estimated creatinine clearance is 40.8 mL/min (by C-G formula based on Cr of 1.39). ------------------------------------------------------------------------------------------------------------------ No results for input(s): HGBA1C in the last 72 hours. ------------------------------------------------------------------------------------------------------------------  Recent Labs  04/16/14 0030  CHOL 149  HDL 48  LDLCALC 80  TRIG 104  CHOLHDL 3.1   ------------------------------------------------------------------------------------------------------------------ No results for input(s): TSH, T4TOTAL, T3FREE, THYROIDAB in the last 72 hours.  Invalid input(s): FREET3 ------------------------------------------------------------------------------------------------------------------ No results for input(s): VITAMINB12, FOLATE, FERRITIN, TIBC, IRON, RETICCTPCT in the last 72 hours.  Coagulation profile  Recent Labs Lab 04/15/14 1619  INR 1.01    No results for input(s): DDIMER in the last 72 hours.  Cardiac Enzymes  Recent Labs Lab 04/15/14 1854 04/16/14 0030 04/16/14 0525  TROPONINI <0.03 0.03 0.03   ------------------------------------------------------------------------------------------------------------------ Invalid input(s): POCBNP    Melainie Krinsky D.O. on 04/17/2014 at 10:16 AM  Between 7am to 7pm - Pager - [phone]  After 7pm go to [email or web address] - password [email or web address]  And look for the night coverage person covering for me after hours  [facility] Office  [phone]

## 2014-04-18 ENCOUNTER — Inpatient Hospital Stay (HOSPITAL_COMMUNITY): Payer: Medicare Other

## 2014-04-18 ENCOUNTER — Encounter (HOSPITAL_COMMUNITY): Payer: Self-pay | Admitting: *Deleted

## 2014-04-18 DIAGNOSIS — E1122 Type 2 diabetes mellitus with diabetic chronic kidney disease: Secondary | ICD-10-CM

## 2014-04-18 DIAGNOSIS — I639 Cerebral infarction, unspecified: Secondary | ICD-10-CM | POA: Insufficient documentation

## 2014-04-18 DIAGNOSIS — I6340 Cerebral infarction due to embolism of unspecified cerebral artery: Secondary | ICD-10-CM

## 2014-04-18 DIAGNOSIS — R4701 Aphasia: Secondary | ICD-10-CM

## 2014-04-18 DIAGNOSIS — N189 Chronic kidney disease, unspecified: Secondary | ICD-10-CM

## 2014-04-18 LAB — GLUCOSE, CAPILLARY
GLUCOSE-CAPILLARY: 70 mg/dL (ref 70–99)
GLUCOSE-CAPILLARY: 72 mg/dL (ref 70–99)
Glucose-Capillary: 101 mg/dL — ABNORMAL HIGH (ref 70–99)
Glucose-Capillary: 91 mg/dL (ref 70–99)

## 2014-04-18 LAB — BASIC METABOLIC PANEL
ANION GAP: 6 (ref 5–15)
BUN: 20 mg/dL (ref 6–23)
CO2: 24 mmol/L (ref 19–32)
CREATININE: 1.52 mg/dL — AB (ref 0.50–1.35)
Calcium: 8.8 mg/dL (ref 8.4–10.5)
Chloride: 108 mEq/L (ref 96–112)
GFR calc non Af Amer: 43 mL/min — ABNORMAL LOW (ref >90)
GFR, EST AFRICAN AMERICAN: 50 mL/min — AB (ref >90)
Glucose, Bld: 146 mg/dL — ABNORMAL HIGH (ref 70–99)
Potassium: 4.2 mmol/L (ref 3.5–5.1)
Sodium: 138 mmol/L (ref 135–145)

## 2014-04-18 LAB — MAGNESIUM: Magnesium: 2.2 mg/dL (ref 1.5–2.5)

## 2014-04-18 LAB — HEPARIN ANTI-XA: Heparin LMW: 1.69 IU/mL

## 2014-04-18 MED ORDER — ASPIRIN EC 81 MG PO TBEC
81.0000 mg | DELAYED_RELEASE_TABLET | Freq: Every day | ORAL | Status: DC
  Administered 2014-04-18 – 2014-04-19 (×2): 81 mg via ORAL
  Filled 2014-04-18 (×2): qty 1

## 2014-04-18 MED ORDER — ASPIRIN 81 MG PO TBEC: 81.0000 mg | DELAYED_RELEASE_TABLET | Freq: Every day | ORAL | Status: DC

## 2014-04-18 MED ORDER — TAMSULOSIN HCL 0.4 MG PO CAPS
0.4000 mg | ORAL_CAPSULE | Freq: Every day | ORAL | Status: DC
  Administered 2014-04-18 – 2014-04-19 (×2): 0.4 mg via ORAL
  Filled 2014-04-18 (×2): qty 1

## 2014-04-18 MED ORDER — ATORVASTATIN CALCIUM 10 MG PO TABS: 10.0000 mg | ORAL_TABLET | Freq: Every day | ORAL | Status: DC

## 2014-04-18 MED ORDER — IOHEXOL 300 MG/ML  SOLN
25.0000 mL | INTRAMUSCULAR | Status: AC
  Administered 2014-04-18 (×2): 25 mL via ORAL

## 2014-04-18 MED ORDER — IOHEXOL 300 MG/ML  SOLN
80.0000 mL | Freq: Once | INTRAMUSCULAR | Status: AC | PRN
  Administered 2014-04-18: 80 mL via INTRAVENOUS

## 2014-04-18 MED ORDER — TAMSULOSIN HCL 0.4 MG PO CAPS: 0.4000 mg | ORAL_CAPSULE | Freq: Every day | ORAL | Status: DC

## 2014-04-18 NOTE — Progress Notes (Signed)
Benefit check in progress for the co pay for Xarelto in progress

## 2014-04-18 NOTE — Consult Note (Signed)
Physical Medicine and Rehabilitation Consult Reason for Consult: Acute ischemic infarct involving the left parietal temporal region with multiple small infarcts Referring Physician: [facility]   HPI: Grant Lara is a 74 y.o. right handed male with history of CVA and DVT/pulmonary emboli maintained on Xarelto, CAD/dilated cardiomyopathy, memory difficulties on Aricept, diabetes mellitus and peripheral neuropathy. Admitted 04/15/2014 with expressive aphasia. MRI of the brain showed acute ischemic infarct involving the left parietal temporal region with multiple additional smaller ischemic infarcts involving the left frontal lobe, bilateral parietal lobes and right temporal occipital region. Multiple remote ischemic infarcts bilateral basal ganglia and cerebellar hemispheres left greater than right. MRA of the head with no proximal branch occlusion or stenosis. Echocardiogram with ejection fraction of 30% and moderate hypokinesis of the entire myocardium. Carotid Doppler no ICA stenosis. EKG with marked a benign early repolarization in anterior leads not significantly changed from prior tracings and reviewed by cardiology services. Patient did not receive TPA. Neurology consulted as patient continues on Xarelto as directed. Tolerating a regular consistency diet. Physical therapy evaluation completed with recommendations of physical medicine rehabilitation consult.   Review of Systems  Unable to perform ROS: language   Past Medical History  Diagnosis Date  . Hypertension   . Dilated cardiomyopathy     2/12: EF 30-35%, trivial AI, mild RAE.  EF 2014 40 -45%  . CAD (coronary artery disease)     LHC 9/05 with Dr. Einar Gip:  dLM 20-30%, LAD 85%, oD1 20-30%.  PCI:  Taxus DES to LAD; Dx jailed and tx with POBA.  Last myoview 12/10: inf scar, no ischemia, EF 29%.  . DVT (deep venous thrombosis)   . Pulmonary embolus     chronic coumadin  . Hyperhomocystinemia   . PFO (patent foramen ovale)     Not  mentioned on 2014 echo.  [name] HLD (hyperlipidemia)   . Crohn's disease   . Nephrolithiasis   . Diabetes mellitus     11/05/11 "borderline; don't take medications"  . Stroke 1993    "left arm can't hold steady; leg too"  . Arthritis     "used to have a touch in my legs"  . Memory difficulties   . Pneumonia ~ 2011    09-22-13 denies any recent SOB or breathing problems  . Swelling of both ankles      09-22-13 occ.feet, but denies pain.   Past Surgical History  Procedure Laterality Date  . Colon surgery  1994; 1996    "for Crohn's disease"  . Appendectomy     Family History  Problem Relation Age of Onset  . Diabetes type II Mother   . CAD Father   . Diabetes type II Brother    Social History:  reports that he has been smoking Cigarettes.  He has a 26.5 pack-year smoking history. He has never used smokeless tobacco. He reports that he does not drink alcohol or use illicit drugs. Allergies: No Known Allergies Medications Prior to Admission  Medication Sig Dispense Refill  . acetaminophen (TYLENOL) 325 MG tablet Take 2 tablets (650 mg total) by mouth every 6 (six) hours as needed for mild pain, moderate pain or fever.    . carvedilol (COREG) 3.125 MG tablet Take 1 tablet (3.125 mg total) by mouth daily. 90 tablet 3  . donepezil (ARICEPT) 5 MG tablet Take 5 mg by mouth at bedtime.    [name] latanoprost (XALATAN) 0.005 % ophthalmic solution Place 1 drop into both eyes at bedtime.    [name]  lisinopril (PRINIVIL,ZESTRIL) 2.5 MG tablet Take 1 tablet (2.5 mg total) by mouth every morning. 90 tablet 3  . rivaroxaban (XARELTO) 20 MG TABS tablet Take 1 tablet (20 mg total) by mouth daily with supper. 30 tablet 0  . glipiZIDE (GLUCOTROL) 5 MG tablet Take 1 tablet (5 mg total) by mouth daily before breakfast. 30 tablet 0  . glucose monitoring kit (FREESTYLE) monitoring kit 1 each by Does not apply route as needed for other. Please give 100 lancets and testing strips.  Check sugars every morning before  breakfast and every evening before dinner 1 each 0    Home: Home Living Family/patient expects to be discharged to:: Unsure Additional Comments: pt with expressive difficulties and no family present  Functional History: Prior Function Comments: pt with expressive difficulties and no family present Functional Status:  Mobility: Bed Mobility Overal bed mobility: Modified Independent General bed mobility comments: sit to supine position Transfers Overall transfer level: Needs assistance Equipment used: None Transfers: Sit to/from Stand Sit to Stand: Min guard General transfer comment: minguard for safety; no LOB Ambulation/Gait Ambulation/Gait assistance: Min assist Ambulation Distance (Feet): 150 Feet Assistive device: None Gait Pattern/deviations: Step-through pattern, Decreased stride length, Decreased weight shift to right, Decreased weight shift to left, Wide base of support Gait velocity: able to slightly incr velocity; could not decr appreciably Gait velocity interpretation: Below normal speed for age/gender General Gait Details: slightly guarded with wide BOS, decr arm swing bil; decr weight shift bil; no overt LOB; pt adamantly wanting to return to room after ~70 feet (unclear why  due to aphasia)    ADL: ADL Overall ADL's : Needs assistance/impaired Grooming: Wash/dry face, Wash/dry hands, Oral care, Applying deodorant, Minimal assistance, Standing Upper Body Bathing: Min guard, Standing Lower Body Bathing: Min guard, Sit to/from stand Upper Body Dressing : Standing, Minimal assistance Lower Body Dressing: Sit to/from stand, Minimal assistance Toilet Transfer: Minimal assistance, Ambulation, Comfort height toilet Toileting- Clothing Manipulation and Hygiene: Minimal assistance (standing) Functional mobility during ADLs: Minimal assistance General ADL Comments: Pt performed grooming/bathing at sink. Pt ambulated to bathroom and performed toileting. Pt with apparent  cognitive/speech deficits. Pt appeared to have difficulty putting deodorant under right arm using LUE, so OT assisted, but when given a wash cloth and told to wash under arms, pt able to use left hand to reach across and wash under right arm.  Cognition: Cognition Overall Cognitive Status: Difficult to assess Orientation Level: Oriented to person, Oriented to place, Oriented to time, Disoriented to situation Cognition Arousal/Alertness: Awake/alert Behavior During Therapy: WFL for tasks assessed/performed Overall Cognitive Status: Difficult to assess Area of Impairment: Following commands, Problem solving Following Commands: Follows one step commands inconsistently, Follows one step commands with increased time Problem Solving: Slow processing, Requires verbal cues Difficult to assess due to: Impaired communication (h/o dementia)  Blood pressure 114/58, pulse 58, temperature 98.7 F (37.1 C), temperature source Oral, resp. rate 18, weight 62.823 kg (138 lb 8 oz), SpO2 100 %. Physical Exam  Vitals reviewed. Constitutional: He appears well-developed.  HENT:  Head: Normocephalic and atraumatic.  Eyes: EOM are normal.  Neck: Normal range of motion. Neck supple. No JVD present. No tracheal deviation present. No thyromegaly present.  Cardiovascular: Normal rate and regular rhythm.   Respiratory: Effort normal and breath sounds normal. No respiratory distress.  GI: Soft. Bowel sounds are normal. He exhibits no distension.  Neurological: He is alert.  Patient is aphasic with word finding difficulties. He was able to provide his name  but not age. Follows simple demonstrated commands. Able to ID "watch" but not "TV" language apraxic. Sometimes apraxic with movements also. RUE: 4/5 delt,bic,tricep, wrist, hand, RLE: 4/5 hf, ke, ankle. RUE and RLE 4+/5. Senses pain on all 4's.   Skin: Skin is warm and dry.  Psychiatric: He has a normal mood and affect. His behavior is normal.    Results for  orders placed or performed during the hospital encounter of 04/15/14 (from the past 24 hour(s))  Glucose, capillary     Status: None   Collection Time: 04/17/14  7:29 AM  Result Value Ref Range   Glucose-Capillary 90 70 - 99 mg/dL  Glucose, capillary     Status: None   Collection Time: 04/17/14 11:23 AM  Result Value Ref Range   Glucose-Capillary 92 70 - 99 mg/dL   Comment 1 Notify RN    Comment 2 Documented in Chart   Glucose, capillary     Status: Abnormal   Collection Time: 04/17/14  4:37 PM  Result Value Ref Range   Glucose-Capillary 124 (H) 70 - 99 mg/dL   Comment 1 Notify RN    Comment 2 Documented in Chart   Glucose, capillary     Status: Abnormal   Collection Time: 04/17/14  8:58 PM  Result Value Ref Range   Glucose-Capillary 115 (H) 70 - 99 mg/dL   Comment 1 Documented in Chart    Comment 2 Notify RN    No results found.  Assessment/Plan: Diagnosis:bi-cerebral embolic infarcts 1. Does the need for close, 24 hr/day medical supervision in concert with the patient's rehab needs make it unreasonable for this patient to be served in a less intensive setting? Yes 2. Co-Morbidities requiring supervision/potential complications: dmw, ckd,  3. Due to bladder management, bowel management, safety, skin/wound care, disease management, medication administration, pain management and patient education, does the patient require 24 hr/day rehab nursing? Yes 4. Does the patient require coordinated care of a physician, rehab nurse, PT (1-2 hrs/day, 5 days/week), OT (1-2 hrs/day, 5 days/week) and SLP (1-2 hrs/day, 5 days/week) to address physical and functional deficits in the context of the above medical diagnosis(es)? Yes Addressing deficits in the following areas: balance, endurance, locomotion, strength, transferring, bowel/bladder control, bathing, dressing, feeding, grooming, toileting, cognition, language and psychosocial support 5. Can the patient actively participate in an intensive  therapy program of at least 3 hrs of therapy per day at least 5 days per week? Yes 6. The potential for patient to make measurable gains while on inpatient rehab is excellent 7. Anticipated functional outcomes upon discharge from inpatient rehab are modified independent and supervision  with PT, modified independent and supervision with OT, min assist with SLP. 8. Estimated rehab length of stay to reach the above functional goals is: 7-10 days 9. Does the patient have adequate social supports and living environment to accommodate these discharge functional goals? Yes 10. Anticipated D/C setting: Home 11. Anticipated post D/C treatments: HH therapy and Outpatient therapy 12. Overall Rehab/Functional Prognosis: excellent  RECOMMENDATIONS: This patient's condition is appropriate for continued rehabilitative care in the following setting: CIR Patient has agreed to participate in recommended program. Yes Note that insurance prior authorization may be required for reimbursement for recommended care.  Comment: Rehab Admissions Coordinator to follow up.  Thanks,  Meredith Staggers, MD, Mellody Drown     04/18/2014

## 2014-04-18 NOTE — Progress Notes (Signed)
I met with pt and then contacted his wife by phone. I will begin authorization with [facility] for a possible inpt rehab admission. SW will need to also pursue SNF rehab as a backup if [employer] does not approve inpt rehab. I will contact SLP for eval . [phone]

## 2014-04-18 NOTE — Progress Notes (Signed)
Physical Therapy Treatment Patient Details Name: Grant Lara MRN: [id number] DOB: 1940-07-10 Today's Date: 04/18/2014    History of Present Illness Adm 04/15/14 with garbled speech; MRI + left parietotemporal infarct with smaller infarcts rt tempoparietal and bil frontal; chronic infarcts bil basal ganglia and cerebellar, Lt> Rt PMHx- Lt weakness from prior CVA, dementia, CAD, cardiomyopathy with CHF, HTN, DVT with PE (on Xarelto), DM, tobacco use, HTN    PT Comments    Pt cooperative, however becomes distracted and frustrated by his expressive aphasia. Balance worse today with multiple losses of balance. Pt unable to express what feels different compared to PT evaluation 04/16/14. Slightly more guarded and ?antalgic during ambulation.   Follow Up Recommendations  CIR;Supervision/Assistance - 24 hour     Equipment Recommendations  None recommended by PT    Recommendations for Other Services OT consult;Speech consult     Precautions / Restrictions Precautions Precautions: Fall Restrictions Weight Bearing Restrictions: No    Mobility  Bed Mobility Overal bed mobility: Modified Independent             General bed mobility comments: incr time/effort (appeared moving stiffly)  Transfers Overall transfer level: Needs assistance Equipment used: None Transfers: Sit to/from Stand Sit to Stand: Min assist         General transfer comment: steady assist with staggering LOB posteriorly on second transfer  Ambulation/Gait Ambulation/Gait assistance: Min assist Ambulation Distance (Feet): 80 Feet Assistive device: None Gait Pattern/deviations: Step-through pattern;Decreased stride length;Staggering left;Drifts right/left;Trunk flexed;Antalgic;Wide base of support   Gait velocity interpretation: Below normal speed for age/gender General Gait Details: slightly guarded with wide BOS, decr arm swing bil; hips and knees flexed with antalgic quality to gait (pt denies pain or  arthritis); + drifting to Lt and stagger to Lt x1 with assist to maintain balance   Stairs            Wheelchair Mobility    Modified Rankin (Stroke Patients Only) Modified Rankin (Stroke Patients Only) Pre-Morbid Rankin Score: Moderate disability (per pt; however expressive difficulties ? accuracy) Modified Rankin: Moderately severe disability     Balance     Sitting balance-Leahy Scale: Good Sitting balance - Comments: > good not assessed     Standing balance-Leahy Scale: Fair                 High Level Balance Comments: pt very frustrated with impaired communication (expressive); following commands    Cognition Arousal/Alertness: Awake/alert Behavior During Therapy: WFL for tasks assessed/performed Overall Cognitive Status: Difficult to assess                      Exercises General Exercises - Lower Extremity Hip Flexion/Marching: AAROM;Both;10 reps;Standing;Limitations Hip Flexion/Marching Limitations: drifts with marching; wide BOS Toe Raises: AAROM;Both;5 reps;Standing;Limitations Toe Raises Limitations: anterior and posterior LOB requiring min assist Mini-Sqauts: AAROM;Both;5 reps    General Comments        Pertinent Vitals/Pain Pain Assessment: No/denies pain Faces Pain Scale: No hurt    Home Living                      Prior Function            PT Goals (current goals can now be found in the care plan section) Acute Rehab PT Goals Patient Stated Goal: unable to state; agrees with PT goals Progress towards PT goals: Progressing toward goals    Frequency  Min 4X/week    PT Plan Current  plan remains appropriate    Co-evaluation             End of Session Equipment Utilized During Treatment: Gait belt Activity Tolerance: Patient tolerated treatment well (pt adamantly returning to room) Patient left: in chair;with call bell/phone within reach;with chair alarm set     Time: 6161-2240 PT Time Calculation  (min) (ACUTE ONLY): 19 min  Charges:  $Gait Training: 8-22 mins                    G Codes:      Grant Lara May 04, 2014, 11:43 AM Pager [phone]

## 2014-04-18 NOTE — Progress Notes (Signed)
[facility]                                                                              Patient Demographics  Grant Lara, is a 74 y.o. male, DOB - Aug 18, 1940, BOF:[id number]  Admit date - 04/15/2014   Admitting Physician Geradine Girt, DO  Outpatient Primary MD for the patient is Lynne Logan, MD  LOS - 3   Chief Complaint  Patient presents with  . Code Stroke      HPI on 04/15/2014 by Dr. Baldwin [place] Grant Lara is a 74 y.o. male who was brought in as a code stroke. When his wife left him at 1400, he was ok. He then left a garbled message on his son's voicemail at 3:30. 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 He is currently having difficulties with is speech. Denies CP. He was not a candidate for TPA due to Xarelto. ? If patient has run out of xarelto,-- family not sure---, there appears to be a prior auth done on 12/30. And a message left by patient that he could not afford the xarelto on 12/22.  ER physician also felt he had EKG changes- denies CP. In the ER, his CT scan of head was negative for bleed. hospitalist were asked to admit  Assessment & Plan   Acute embolic stroke -MRI shows an acute ischemic infarct involving the left parietotemporal region with multiple smaller ischemic infarcts involving the left frontal lobe, bilateral parietal lobes, right temporal occipital region. Underlying embolic disease is suspected -Continue Xarelto and statin -LDL 80, goal less than 70 -Neurology consulted and appreciated -Hemoglobin A1c 6.5 -Echocardiogram: EF 25-30% -Carotid Doppler: 1-39% ICA stenosis, vertebral artery flow is antegrade -PT and OT consulted and recommended inpatient rehabilitation -PMR consulted -Spoke with Mrs. Algeo regarding price of Xarelto. She opted not to switch patient to Coumadin. I advised her to speak with patient's PCP if she changes her mind after discharge.  -Neurology recommended CT abd/pelvis/Chest to rule out malignancy-  which were negative -Lower extremity doppler to rule out DVT pending  Essential hypertension -Stable, Continue Coreg, lisinopril  Chronic systolic heart failure -Appears compensated -Echocardiogram EF 25-30% -Continue ACE inhibitor and Coreg -Continue to monitor daily weights and I/O  Recent pulmonary embolism -Continue Xarelto  Diabetes mellitus, type II -Hemoglobin A1c pending -Continue insulin sliding scale CBG monitoring  Nonsustained ventricular tachycardia -Magnesium 1.7, replaced (goal >2), K goal >4 -Cardiology consulted and appreciated; likely secondary to Acute CVA rather than coronary ischemia  Chronic kidney disease, stage III -Creatinine appears to be at baseline  Abnormal EKG -No change from previous  75m Stone with Right hydyronephrosis -Seen on CT abd  -Spoke to urology, Dr. WJeffie Pollock who stated that if patient is not having any pain, he will see the patient in the office in 1-2 weeks. If patient begins to develop pain, [facility] -Started patient on Flomax  Code Status: Full  Family Communication: None at bedside, wife via phone  Disposition Plan: Admitted. Pending CIR  Time Spent in minutes   30 minutes  Procedures  Echocardiogram Carotid Doppler LE doppler  [place]   Neurology Cardiology Urology, Dr. Jeffie Pollock via phone  DVT Prophylaxis  Xarelto  Lab Results  Component Value Date   PLT 138* 04/15/2014    Medications  Scheduled Meds: . aspirin EC  81 mg Oral Daily  . atorvastatin  10 mg Oral q1800  . carvedilol  3.125 mg Oral Daily  . donepezil  5 mg Oral QHS  . insulin aspart  0-15 Units Subcutaneous TID WC  . latanoprost  1 drop Both Eyes QHS  . lisinopril  2.5 mg Oral Daily  . rivaroxaban  20 mg Oral Q supper  . tamsulosin  0.4 mg Oral Daily   Continuous Infusions:  PRN Meds:.acetaminophen, senna-docusate  Antibiotics    Anti-infectives    None        Subjective:   Grant  Lara seen and examined today.  Patient has no complaints today. Denies any chest pain, shortness of breath, headache, dizziness.  Objective:   Filed Vitals:   04/17/14 2100 04/18/14 0644 04/18/14 0914 04/18/14 1406  BP: 114/58 111/53 100/58 128/65  Pulse: 58 63 61 109  Temp: 98.7 F (37.1 C) 98.2 F (36.8 C) 98 F (36.7 C) 97.7 F (36.5 C)  TempSrc: Oral Oral Oral Oral  Resp: 18 18 18 18   Weight:      SpO2: 100% 100% 98% 100%    Wt Readings from Last 3 Encounters:  04/15/14 62.823 kg (138 lb 8 oz)  02/07/14 62.143 kg (137 lb)  11/22/13 61.78 kg (136 lb 3.2 oz)     Intake/Output Summary (Last 24 hours) at 04/18/14 1631 Last data filed at 04/18/14 0904  Gross per 24 hour  Intake    200 ml  Output    100 ml  Net    100 ml    Exam  General: Well developed, well nourished, NAD, appears stated age  HEENT: NCAT,  mucous membranes moist.   Cardiovascular: S1 S2 auscultated, no rubs, murmurs or gallops. Regular rate and rhythm.  Respiratory: Clear to auscultation bilaterally with equal chest rise  Abdomen: Soft, nontender, nondistended, + bowel sounds  Extremities: warm dry without cyanosis clubbing or edema  Neuro: AAOx3, nonfocal however, some hesitation with word finding  Psych: Appropriate  Data Review   Micro Results No results found for this or any previous visit (from the past 240 hour(s)).  Radiology Reports Ct Head Wo Contrast  04/15/2014   CLINICAL DATA:  Ectasia.  Hypertension.  Coronary artery disease.  EXAM: CT HEAD WITHOUT CONTRAST  TECHNIQUE: Contiguous axial images were obtained from the base of the skull through the vertex without intravenous contrast.  COMPARISON:  07/17/2013  FINDINGS: Sinuses/Soft tissues: Cerumen in both external ear canals. Clear paranasal sinuses and mastoid air cells.  Intracranial: Advanced cerebral atrophy. Moderate low density in the periventricular white matter likely related to small vessel disease.  Remote left-sided  cerebellar infarct. Bilateral basal ganglia lacunar infarcts which are also felt to be chronic. Cortically based infarct involving the right occipital lobe is not significantly changed.  No mass lesion, hemorrhage, hydrocephalus, acute infarct, intra-axial, or extra-axial fluid collection.  IMPRESSION: 1.  No acute intracranial abnormality. 2. Advanced atrophy with remote infarcts and diffuse small vessel ischemic change. These results were called by telephone at the time of interpretation on 04/15/2014 at 4:20 pm to Dr. Armida Sans, who verbally acknowledged these results.   Electronically Signed   By: Abigail Miyamoto M.D.   On: 04/15/2014 16:20   Ct Chest W Contrast  04/18/2014  CLINICAL DATA:  Acute stroke.  EXAM: CT CHEST, ABDOMEN, AND PELVIS WITH CONTRAST  TECHNIQUE: Multidetector CT imaging of the chest, abdomen and pelvis was performed following the standard protocol during bolus administration of intravenous contrast.  CONTRAST:  87m OMNIPAQUE IOHEXOL 300 MG/ML  SOLN  COMPARISON:  None.  CT scans dated 11/20/2013 and 07/13/2013  FINDINGS: CT CHEST FINDINGS  There is a single small area of residual pulmonary embolus in the right lower lobe. The majority of the emboli have resolved. No new emboli. There is an area of scarring and cavitation in the right upper lobe posteriorly which is stable. The extensive infiltrate at the right lung base has resolved with a small peripheral wedge-shaped area of scarring in the right lower lobe posteriorly is also and slight atelectasis or scarring in the same area. The lungs are otherwise clear. No pulmonary edema or effusions. No hilar or mediastinal adenopathy. Heart size is normal. Moderate coronary artery calcification. No osseous abnormality  CT ABDOMEN AND PELVIS FINDINGS  The liver, spleen and minimally prominent on a chronic basis. The pancreas is otherwise normal. Adrenal glands are normal except for a 8 mm benign appearing cyst in the lateral aspect of the right lobe of  the liver. Biliary tree is normal.  There are multiple bilateral renal cysts. There is new moderate right hydronephrosis. The right ureter is dilated into the pelvis or there is a 4 mm stone in the right ureterovesical junction, possibly in a ureterocele. The bladder is otherwise normal. Prostate gland is slightly enlarged.  No dilated loops of large or small bowel. Previous partial colectomy. No adenopathy. No acute osseous abnormality. Chronic calcification in or around the left S1 nerve root sleeve of unknown etiology.  IMPRESSION: 1. Almost complete resolution of the previously demonstrated pulmonary emboli with a small residual embolus on the right lower lobe. 2. Stable scarring at the right lung apex with slight scarring at the right lung base from previous extensive infiltrate seen on the prior CT scan. 3. New moderate right hydronephrosis due to a 4 mm stone in the right ureterovesical junction.   Electronically Signed   By: JRozetta NunneryM.D.   On: 04/18/2014 14:23   [name]  04/16/2014   CLINICAL DATA:  Aphasia.  Evaluate for stroke.  Initial evaluation.  EXAM: MRI HEAD WITHOUT CONTRAST  MRA HEAD WITHOUT CONTRAST  TECHNIQUE: Multiplanar, multiecho pulse sequences of the brain and surrounding structures were obtained without intravenous contrast. Angiographic images of the head were obtained using MRA technique without contrast.  COMPARISON:  Prior CT from earlier the same day.  FINDINGS: MRI HEAD FINDINGS  Diffuse prominence of the CSF containing spaces is compatible with advanced generalized cerebral atrophy patchy and confluent T2/FLAIR hyperintensity within the periventricular and deep white matter both cerebral hemispheres most consistent with chronic small vessel ischemic disease. Overall, these findings have progressed relative to most recent MRI from 2012.  Multiple remote lacunar infarcts present within the basal ganglia bilaterally. Specifically, these involve the posterior limb of  the left internal capsule as well as the right thalamus. Probable remote left thalamic infarct present as well. Bilateral cerebellar infarcts present, left slightly worse than right. Encephalomalacia within the high bifrontal regions may reflect remote infarcts as well.  There is abnormal restricted diffusion involving the left parietotemporal region, compatible with acute ischemic infarct. This region of infarct involves primarily the cortical gray matter (series 4, image 26). No associated hemorrhage or significant mass effect. There is localized  gyral swelling within the infarcted territory.  Several additional small acute ischemic infarcts present within the right temporal and occipital lobes (series 4, image 16). The largest of these measures 5 mm in the right occipital lobe. Few additional subcentimeter infarcts involve the gray matter more superiorly within the right parietal region (series 4, image 27). Ischemic infarct involving the left frontal lobe appears to be slightly more subacute in nature (series 4, image 29). Possible embolic phenomenon is suspected given the various vascular distributions. No associated hemorrhage.  No mass lesion or midline shift. No extra-axial fluid collection. The ventricular prominence related to global parenchymal volume loss present without hydrocephalus.  Craniocervical junction within normal limits. Degenerative changes noted within the visualized upper cervical spine. Pituitary gland within normal limits. No acute abnormality seen about the orbits.  Paranasal sinuses are clear.  No mastoid effusion.  MRA HEAD FINDINGS  ANTERIOR CIRCULATION:  In visualized portions of the distal cervical segments of the internal carotid arteries are widely patent bilaterally. The petrous and cavernous segments are widely patent. There is mild multi focal narrowing of the supra clinoid segments bilaterally, left greater than right. A1 segments widely patent. Anterior communicating artery  normal. Anterior cerebral arteries well opacified.  Mild multi focal atherosclerotic irregularity present within the M1 segments without hemodynamically significant stenosis. The right M1 segment appears to be widely patent. These changes are greater within the left MCA. Mild diffuse atherosclerotic irregularity seen within the distal MCA branches.  POSTERIOR CIRCULATION:  Left vertebral artery is dominant. There is moderate multi focal narrowing of the distal right vertebral artery (series 6, image 132). Posterior inferior cerebral arteries patent bilaterally with multi focal atherosclerotic irregularity. Vertebrobasilar junction normal. Mild irregularity present within the basilar artery without focal high-grade stenosis.  Multi focal atherosclerotic irregularity present within the superior cerebellar arteries without occlusion or hemodynamically significant stenosis. P1 segments patent bilaterally. There is moderate short-segment stenosis of the proximal P2 segments bilaterally. P2 segments are opacified distally, although the most distal aspects are poorly evaluated.  No aneurysm or vascular malformation.  IMPRESSION: MRI HEAD IMPRESSION:  1. Acute ischemic infarct involving the left parietotemporal region, with multiple additional smaller ischemic infarcts involving the left frontal lobe, bilateral parietal lobes, and right temporal-occipital region. Underlying embolic disease is suspected given the various vascular distributions. 2. Multiple remote ischemic infarcts involving the bilateral basal ganglia and cerebellar hemispheres, left greater than right. 3. Advanced cerebral atrophy with chronic small vessel ischemic disease. These changes have progressed relative to most recent MRI from 2012.  MRA HEAD IMPRESSION:  1. No proximal branch occlusion identified within the intracranial circulation. 2. Moderate short-segment stenoses within the proximal P2 segments bilaterally. 3. Mild narrowing of the supra  clinoid aspect of the internal carotid arteries bilaterally, left greater than right. 4. Moderate multi focal atherosclerotic narrowing of the distal right vertebral artery. The left vertebral artery is dominant. 5. Multi focal atherosclerotic irregularity involving the M1 segments, distal MCA branches, superior cerebral arteries, and posterior inferior cerebellar arteries.   Electronically Signed   By: Jeannine Boga M.D.   On: 04/16/2014 00:54   Ct Abdomen Pelvis W Contrast  04/18/2014   CLINICAL DATA:  Acute stroke.  EXAM: CT CHEST, ABDOMEN, AND PELVIS WITH CONTRAST  TECHNIQUE: Multidetector CT imaging of the chest, abdomen and pelvis was performed following the standard protocol during bolus administration of intravenous contrast.  CONTRAST:  57m OMNIPAQUE IOHEXOL 300 MG/ML  SOLN  COMPARISON:  None.  CT scans dated 11/20/2013 and  07/13/2013  FINDINGS: CT CHEST FINDINGS  There is a single small area of residual pulmonary embolus in the right lower lobe. The majority of the emboli have resolved. No new emboli. There is an area of scarring and cavitation in the right upper lobe posteriorly which is stable. The extensive infiltrate at the right lung base has resolved with a small peripheral wedge-shaped area of scarring in the right lower lobe posteriorly is also and slight atelectasis or scarring in the same area. The lungs are otherwise clear. No pulmonary edema or effusions. No hilar or mediastinal adenopathy. Heart size is normal. Moderate coronary artery calcification. No osseous abnormality  CT ABDOMEN AND PELVIS FINDINGS  The liver, spleen and minimally prominent on a chronic basis. The pancreas is otherwise normal. Adrenal glands are normal except for a 8 mm benign appearing cyst in the lateral aspect of the right lobe of the liver. Biliary tree is normal.  There are multiple bilateral renal cysts. There is new moderate right hydronephrosis. The right ureter is dilated into the pelvis or there is a  4 mm stone in the right ureterovesical junction, possibly in a ureterocele. The bladder is otherwise normal. Prostate gland is slightly enlarged.  No dilated loops of large or small bowel. Previous partial colectomy. No adenopathy. No acute osseous abnormality. Chronic calcification in or around the left S1 nerve root sleeve of unknown etiology.  IMPRESSION: 1. Almost complete resolution of the previously demonstrated pulmonary emboli with a small residual embolus on the right lower lobe. 2. Stable scarring at the right lung apex with slight scarring at the right lung base from previous extensive infiltrate seen on the prior CT scan. 3. New moderate right hydronephrosis due to a 4 mm stone in the right ureterovesical junction.   Electronically Signed   By: Rozetta Nunnery M.D.   On: 04/18/2014 14:23   Dg Chest Portable 1 View  04/15/2014   CLINICAL DATA:  One-day history of altered mental status, initial encounter.  EXAM: PORTABLE CHEST - 1 VIEW  COMPARISON:  11/20/2013  FINDINGS: 1638 hrs. Lungs are hyperexpanded The lungs are clear without focal infiltrate, edema, pneumothorax or pleural effusion. Cardiopericardial silhouette is at upper limits of normal for size. Imaged bony structures of the thorax are intact. Telemetry leads overlie the chest.  IMPRESSION: Hyperexpansion without acute cardiopulmonary findings.   Electronically Signed   By: Misty Stanley M.D.   On: 04/15/2014 16:50   Mr Jodene Nam Head/brain Wo Cm  04/16/2014   CLINICAL DATA:  Aphasia.  Evaluate for stroke.  Initial evaluation.  EXAM: MRI HEAD WITHOUT CONTRAST  MRA HEAD WITHOUT CONTRAST  TECHNIQUE: Multiplanar, multiecho pulse sequences of the brain and surrounding structures were obtained without intravenous contrast. Angiographic images of the head were obtained using MRA technique without contrast.  COMPARISON:  Prior CT from earlier the same day.  FINDINGS: MRI HEAD FINDINGS  Diffuse prominence of the CSF containing spaces is compatible with  advanced generalized cerebral atrophy patchy and confluent T2/FLAIR hyperintensity within the periventricular and deep white matter both cerebral hemispheres most consistent with chronic small vessel ischemic disease. Overall, these findings have progressed relative to most recent MRI from 2012.  Multiple remote lacunar infarcts present within the basal ganglia bilaterally. Specifically, these involve the posterior limb of the left internal capsule as well as the right thalamus. Probable remote left thalamic infarct present as well. Bilateral cerebellar infarcts present, left slightly worse than right. Encephalomalacia within the high bifrontal regions may reflect remote infarcts  as well.  There is abnormal restricted diffusion involving the left parietotemporal region, compatible with acute ischemic infarct. This region of infarct involves primarily the cortical gray matter (series 4, image 26). No associated hemorrhage or significant mass effect. There is localized gyral swelling within the infarcted territory.  Several additional small acute ischemic infarcts present within the right temporal and occipital lobes (series 4, image 16). The largest of these measures 5 mm in the right occipital lobe. Few additional subcentimeter infarcts involve the gray matter more superiorly within the right parietal region (series 4, image 27). Ischemic infarct involving the left frontal lobe appears to be slightly more subacute in nature (series 4, image 29). Possible embolic phenomenon is suspected given the various vascular distributions. No associated hemorrhage.  No mass lesion or midline shift. No extra-axial fluid collection. The ventricular prominence related to global parenchymal volume loss present without hydrocephalus.  Craniocervical junction within normal limits. Degenerative changes noted within the visualized upper cervical spine. Pituitary gland within normal limits. No acute abnormality seen about the orbits.   Paranasal sinuses are clear.  No mastoid effusion.  MRA HEAD FINDINGS  ANTERIOR CIRCULATION:  In visualized portions of the distal cervical segments of the internal carotid arteries are widely patent bilaterally. The petrous and cavernous segments are widely patent. There is mild multi focal narrowing of the supra clinoid segments bilaterally, left greater than right. A1 segments widely patent. Anterior communicating artery normal. Anterior cerebral arteries well opacified.  Mild multi focal atherosclerotic irregularity present within the M1 segments without hemodynamically significant stenosis. The right M1 segment appears to be widely patent. These changes are greater within the left MCA. Mild diffuse atherosclerotic irregularity seen within the distal MCA branches.  POSTERIOR CIRCULATION:  Left vertebral artery is dominant. There is moderate multi focal narrowing of the distal right vertebral artery (series 6, image 132). Posterior inferior cerebral arteries patent bilaterally with multi focal atherosclerotic irregularity. Vertebrobasilar junction normal. Mild irregularity present within the basilar artery without focal high-grade stenosis.  Multi focal atherosclerotic irregularity present within the superior cerebellar arteries without occlusion or hemodynamically significant stenosis. P1 segments patent bilaterally. There is moderate short-segment stenosis of the proximal P2 segments bilaterally. P2 segments are opacified distally, although the most distal aspects are poorly evaluated.  No aneurysm or vascular malformation.  IMPRESSION: MRI HEAD IMPRESSION:  1. Acute ischemic infarct involving the left parietotemporal region, with multiple additional smaller ischemic infarcts involving the left frontal lobe, bilateral parietal lobes, and right temporal-occipital region. Underlying embolic disease is suspected given the various vascular distributions. 2. Multiple remote ischemic infarcts involving the bilateral  basal ganglia and cerebellar hemispheres, left greater than right. 3. Advanced cerebral atrophy with chronic small vessel ischemic disease. These changes have progressed relative to most recent MRI from 2012.  MRA HEAD IMPRESSION:  1. No proximal branch occlusion identified within the intracranial circulation. 2. Moderate short-segment stenoses within the proximal P2 segments bilaterally. 3. Mild narrowing of the supra clinoid aspect of the internal carotid arteries bilaterally, left greater than right. 4. Moderate multi focal atherosclerotic narrowing of the distal right vertebral artery. The left vertebral artery is dominant. 5. Multi focal atherosclerotic irregularity involving the M1 segments, distal MCA branches, superior cerebral arteries, and posterior inferior cerebellar arteries.   Electronically Signed   By: Jeannine Boga M.D.   On: 04/16/2014 00:54    CBC  Recent Labs Lab 04/15/14 1619 04/15/14 1626  WBC 4.8  --   HGB 14.3 15.6  HCT 42.9 46.0  PLT 138*  --   MCV 92.1  --   MCH 30.7  --   MCHC 33.3  --   RDW 13.0  --   LYMPHSABS 1.7  --   MONOABS 0.4  --   EOSABS 0.1  --   BASOSABS 0.0  --     Chemistries   Recent Labs Lab 04/15/14 1619 04/15/14 1626 04/16/14 0525 04/17/14 0435 04/18/14 0800 04/18/14 0912  NA 138 139  --  137 138  --   K 4.6 4.6  --  4.1 4.2  --   CL 105 105  --  108 108  --   CO2 25  --   --  25 24  --   GLUCOSE 182* 185*  --  91 146*  --   BUN 17 20  --  18 20  --   CREATININE 1.46* 1.60*  --  1.39* 1.52*  --   CALCIUM 9.1  --   --  8.8 8.8  --   MG  --   --  1.7  --   --  2.2  AST 16  --   --   --   --   --   ALT 11  --   --   --   --   --   ALKPHOS 116  --   --   --   --   --   BILITOT 0.6  --   --   --   --   --    ------------------------------------------------------------------------------------------------------------------ estimated creatinine clearance is 37.3 mL/min (by C-G formula based on Cr of  1.52). ------------------------------------------------------------------------------------------------------------------  Recent Labs  04/16/14 0525  HGBA1C 6.5*   ------------------------------------------------------------------------------------------------------------------  Recent Labs  04/16/14 0030  CHOL 149  HDL 48  LDLCALC 80  TRIG 104  CHOLHDL 3.1   ------------------------------------------------------------------------------------------------------------------ No results for input(s): TSH, T4TOTAL, T3FREE, THYROIDAB in the last 72 hours.  Invalid input(s): FREET3 ------------------------------------------------------------------------------------------------------------------ No results for input(s): VITAMINB12, FOLATE, FERRITIN, TIBC, IRON, RETICCTPCT in the last 72 hours.  Coagulation profile  Recent Labs Lab 04/15/14 1619  INR 1.01    No results for input(s): DDIMER in the last 72 hours.  Cardiac Enzymes  Recent Labs Lab 04/15/14 1854 04/16/14 0030 04/16/14 0525  TROPONINI <0.03 0.03 0.03   ------------------------------------------------------------------------------------------------------------------ Invalid input(s): POCBNP    Charnel Giles D.O. on 04/18/2014 at 4:31 PM  Between 7am to 7pm - Pager - [phone]  After 7pm go to [email or web address] - password [email or web address]  And look for the night coverage person covering for me after hours  [facility] Office  [phone]

## 2014-04-18 NOTE — Progress Notes (Signed)
BENEFIT CHECK FOR XARELTO ---04/18/2014 1402 by Madelin Headings CMA  per rep at [facility]  auth on file alreadY / prior auth # [id number] deductible of $170/ $0 met unable to provide co-pay until deductible met patient will pay approx $215  for 1st fill  patient can use most major retail pharmacies

## 2014-04-18 NOTE — Progress Notes (Signed)
STROKE TEAM PROGRESS NOTE   HISTORY Grant Lara is a 74 y.o. 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 Patient was last seen normal at 1400 hours today 04/15/2014. He called his daughter at 53 and was noted to have trouble getting his words out. EMS was called to house and code stroke. On arrival he remained expressively aphasic with no other symptoms. tPA was not given due to being on Xeralto.   Date last known well: Date: 04/15/2014 Time last known well: Time: 14:00 tPA Given: No: on Xarelto   SUBJECTIVE (INTERVAL HISTORY) No family is at the bedside. He still has expressive aphasia. Xarelto continued.   OBJECTIVE Temp:  [97.7 F (36.5 C)-98.7 F (37.1 C)] 98 F (36.7 C) (01/04 0914) Pulse Rate:  [58-65] 61 (01/04 0914) Cardiac Rhythm:  [-] Normal sinus rhythm (01/04 0805) Resp:  [18-20] 18 (01/04 0914) BP: (100-114)/(53-62) 100/58 mmHg (01/04 0914) SpO2:  [98 %-100 %] 98 % (01/04 0914)   Recent Labs Lab 04/17/14 1123 04/17/14 1637 04/17/14 2058 04/18/14 0702 04/18/14 1102  GLUCAP 92 124* 115* 101* 91    Recent Labs Lab 04/15/14 1619 04/15/14 1626 04/16/14 0525 04/17/14 0435 04/18/14 0800 04/18/14 0912  NA 138 139  --  137 138  --   K 4.6 4.6  --  4.1 4.2  --   CL 105 105  --  108 108  --   CO2 25  --   --  25 24  --   GLUCOSE 182* 185*  --  91 146*  --   BUN 17 20  --  18 20  --   CREATININE 1.46* 1.60*  --  1.39* 1.52*  --   CALCIUM 9.1  --   --  8.8 8.8  --   MG  --   --  1.7  --   --  2.2    Recent Labs Lab 04/15/14 1619  AST 16  ALT 11  ALKPHOS 116  BILITOT 0.6  PROT 6.4  ALBUMIN 3.4*    Recent Labs Lab 04/15/14 1619 04/15/14 1626  WBC 4.8  --   NEUTROABS 2.6  --   HGB 14.3 15.6  HCT 42.9 46.0  MCV 92.1  --   PLT 138*  --     Recent Labs Lab 04/15/14 1854 04/16/14 0030 04/16/14 0525  TROPONINI <0.03 0.03 0.03    Recent Labs  04/15/14 1619  LABPROT 13.4  INR 1.01    Recent Labs  04/15/14 1700   COLORURINE YELLOW  LABSPEC 1.018  PHURINE 6.0  GLUCOSEU NEGATIVE  HGBUR NEGATIVE  BILIRUBINUR NEGATIVE  KETONESUR NEGATIVE  PROTEINUR NEGATIVE  UROBILINOGEN 0.2  NITRITE NEGATIVE  LEUKOCYTESUR NEGATIVE       Component Value Date/Time   CHOL 149 04/16/2014 0030   TRIG 104 04/16/2014 0030   HDL 48 04/16/2014 0030   CHOLHDL 3.1 04/16/2014 0030   VLDL 21 04/16/2014 0030   LDLCALC 80 04/16/2014 0030   Lab Results  Component Value Date   HGBA1C 6.5* 04/16/2014      Component Value Date/Time   LABOPIA NONE DETECTED 04/15/2014 1700   COCAINSCRNUR NONE DETECTED 04/15/2014 1700   LABBENZ NONE DETECTED 04/15/2014 1700   AMPHETMU NONE DETECTED 04/15/2014 1700   THCU NONE DETECTED 04/15/2014 1700   LABBARB NONE DETECTED 04/15/2014 1700     Recent Labs Lab 04/15/14 1619  ETH <5    Ct Head Wo Contrast 04/15/2014    1.  No  acute intracranial abnormality.  2. Advanced atrophy with remote infarcts and diffuse small vessel ischemic change.   MRI HEAD IMPRESSION 04/16/2014    1. Acute ischemic infarct involving the left parietotemporal region, with multiple additional smaller ischemic infarcts involving the left frontal lobe, bilateral parietal lobes, and right temporal-occipital region. Underlying embolic disease is suspected given the various vascular distributions.  2. Multiple remote ischemic infarcts involving the bilateral basal ganglia and cerebellar hemispheres, left greater than right.  3. Advanced cerebral atrophy with chronic small vessel ischemic disease. These changes have progressed relative to most recent MRI from 2012.    MRA HEAD IMPRESSION 04/16/2014    1. No proximal branch occlusion identified within the intracranial circulation.  2. Moderate short-segment stenoses within the proximal P2 segments bilaterally.  3. Mild narrowing of the supra clinoid aspect of the internal carotid arteries bilaterally, left greater than right.  4. Moderate multi focal  atherosclerotic narrowing of the distal right vertebral artery. The left vertebral artery is dominant.  5. Multi focal atherosclerotic irregularity involving the M1 segments, distal MCA branches, superior cerebral arteries, and posterior inferior cerebellar arteries.     Dg Chest Portable 1 View 04/15/2014    Hyperexpansion without acute cardiopulmonary findings.     Carotid Doppler  There is 1-39% bilateral ICA stenosis. Vertebral artery flow is antegrade.    2D Echocardiogram  EF 25-30% with no source of embolus.   Pan CT 1. Almost complete resolution of the previously demonstrated pulmonary emboli with a small residual embolus on the right lower lobe. 2. Stable scarring at the right lung apex with slight scarring at the right lung base from previous extensive infiltrate seen on the prior CT scan. 3. New moderate right hydronephrosis due to a 4 mm stone in the right ureterovesical junction.  LV venous doppler - pending   PHYSICAL EXAM Neurological Examination Mental Status: Alert . Significant aphasia and word finding difficulties. Able to follow simple commands without difficulty. Cranial Nerves: II: Discs not visualized; Visual fields grossly normal, pupils equal, round, reactive to light and accommodation III,IV, VI: ptosis not present, extra-ocular motions intact bilaterally V,VII: smile symmetric, facial light touch sensation normal bilaterally VIII: hearing normal bilaterally IX,X: gag reflex present XI: bilateral shoulder shrug XII: midline tongue extension Motor: Strength 5 over 5 throughout. Tone and bulk:normal tone throughout; no atrophy noted Sensory:  light touch intact throughout, bilaterally Deep Tendon Reflexes: 1+ and symmetric throughout UE and no AJ Plantars: Right: downgoingLeft: downgoing Cerebellar: normal finger-to-nose, Gait: not tested due to safety   ASSESSMENT/PLAN [name] is a 74 y.o. male with  history of  hypertension, dilated cardiomyopathy, coronary artery disease, previous DVT, previous pulmonary embolus on AC, hyperlipidemia, PFO, diabetes mellitus, previous stroke, and memory difficulties presenting with expressive aphasia. He did not receive IV t-PA as he was already on Xarelto.  Stroke: left MCA parietotemporal cortical and subcortical infarcts, b/l parietal and right MCA/PCA punctate infarcts, consistent with cardioembolic strokes.  Resultant  expressive aphasia  MRI  See above.  MRA  diffuse mild to moderate narrowing.  Carotid Doppler - Preliminary report: 1-39% ICA stenosis. Vertebral artery flow is antegrade.  2D Echo EF 25-30%, unchanged from prior, no LV thrombus seen  LDL 80  HgbA1c 6.5  Stated weight loss but pan CT abd/chest/pelvis ruled out malignancy   Eliquis for VTE prophylaxis  Diet heart healthy/carb modified with thin liquids  xarelto ( rivaroxaban) prior to admission, now on xarelto ( rivaroxaban).  LMWH level 1.69  within therapeutic range, indicating pt responding to Xarelto. Consider addition of aspirin 81 mg daily to NOAC.   Patient counseled to be compliant with his antithrombotic medications  Ongoing aggressive stroke risk factor management  Therapy recommendations - CIR recommended. 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   Chronic systolic heart failure  CKD, stage 3, Cr today 1.52  Recent pulmonary embolism Aug 2015 - on AC  Nonsustained VT - on coreg  Other Pertinent History  Cardiology following for dilated cardiomyopathy and coronary artery disease  Hospital day # [facility] for Pager information 04/18/2014 12:26 PM   I, the attending vascular neurologist, have personally obtained a history, examined the patient, evaluated laboratory data, individually viewed imaging studies and agree with radiology interpretations.  Together with the NP/PA, we formulated the assessment and plan of care which reflects our mutual decision.  I have made any additions or clarifications directly to the above note and agree with the findings and plan as currently documented.   74 yo male with DVT/PE on AC with multiple stroke risk factors including low EF, smoking, HTN, DM, CAD, PFO and hx of stroke admitted for recurrent stroke. Stroke felt to be cardioembolic pattern, likely related to low EF and hx of DVT in the setting of PFO. He was reported to compliant with Xarelto. Checked anti-Xa level which is therapeutic. Pan-CT ruled out malignancy. He also has other risk factors listed above, therefore, would recommend add ASA 81 to Xarelto. Continue stroke risk factor modification, start lipitor and quit smoking.   Neurology will sign off. Please call with questions. Pt will follow up with Dr. Erlinda Hong at [facility] in about 2 months. Thanks for the consult.  Rosalin Hawking, MD PhD Stroke Neurology 04/18/2014 6:55 PM    To contact Stroke Continuity provider, please refer to [email or web address] After hours, contact General Neurology

## 2014-04-18 NOTE — Clinical Social Work Placement (Signed)
Clinical Social Work Department CLINICAL SOCIAL WORK PLACEMENT NOTE 04/18/2014  Patient:  Grant Lara, Grant Lara  Account Number:  [id number] Admit date:  04/15/2014  Clinical Social Worker:  Glendon Axe, CLINICAL SOCIAL WORKER  Date/time:  04/18/2014 02:28 PM  Clinical Social Work is seeking post-discharge placement for this patient at the following level of care:   SKILLED NURSING   (*CSW will update this form in Epic as items are completed)   04/18/2014  Patient/family provided with [place] Department of Clinical Social Work's list of facilities offering this level of care within the geographic area requested by the patient (or if unable, by the patient's family).  04/18/2014  Patient/family informed of their freedom to choose among providers that offer the needed level of care, that participate in Medicare, Medicaid or managed care program needed by the patient, have an available bed and are willing to accept the patient.  04/18/2014  Patient/family informed of [facility]' ownership interest in [facility], as well as of the fact that they are under no obligation to receive care at this facility.  PASARR submitted to EDS on 04/18/2014 PASARR number received on 04/18/2014  FL2 transmitted to all facilities in geographic area requested by pt/family on  04/18/2014 FL2 transmitted to all facilities within larger geographic area on   Patient informed that his/her managed care company has contracts with or will negotiate with  certain facilities, including the following:   YES     Patient/family informed of bed offers received:  04/18/2014 Patient chooses bed at [place] Physician recommends and patient chooses bed at    Patient to be transferred to [place] on   Patient to be transferred to facility by  Patient and family notified of transfer on  Name of family member notified:    The following physician request were entered in Epic:   Additional  Comments:   Glendon Axe, MSW, LCSWA [phone] 04/18/2014 2:30 PM

## 2014-04-18 NOTE — Evaluation (Signed)
Speech Language Pathology Evaluation Patient Details Name: Grant Lara MRN: [id number] DOB: 1940/10/03 Today's Date: 04/18/2014 Time: 3976-7341 SLP Time Calculation (min) (ACUTE ONLY): 32 min  Problem List:  Patient Active Problem List   Diagnosis Date Noted  . Hyperlipidemia   . Coronary artery disease involving native coronary artery of native heart without angina pectoris   . LVH (left ventricular hypertrophy)   . NSVT (nonsustained ventricular tachycardia) 04/16/2014  . CKD (chronic kidney disease) stage 3, GFR 30-59 ml/min 04/16/2014  . Aphasia 04/15/2014  . Abnormal EKG   . Anemia, unspecified 11/21/2013  . DM2 (diabetes mellitus, type 2) 11/20/2013  . Acute pulmonary embolism with pulmonary infarction. 11/20/2013  . Leukocytosis 11/20/2013  . Hyperglycemia 08/22/2013  . Dehydration 08/21/2013  . ARF (acute renal failure) 08/21/2013  . Abnormal ECG 08/21/2013  . Delirium 07/18/2013  . Small bowel obstruction 07/13/2013  . Encounter for therapeutic drug monitoring 05/11/2013  . Crohn's disease 02/16/2012  . Abnormal CT scan of lung 11/08/2011  . Bilateral leg and foot pain 11/06/2011  . SBO (small bowel obstruction) 11/05/2011  . HLD (hyperlipidemia) 03/28/2011  . Pulmonary embolism 06/05/2010  . Acute embolic stroke 93/79/0240  . DVT (deep venous thrombosis) 06/05/2010  . EMPHYSEMATOUS BLEB 04/06/2010  . C O P D 04/05/2010  . Chronic systolic heart failure 97/35/3299  . Memory loss 04/03/2009  . CHEST PAIN 03/08/2009  . Essential hypertension, benign 02/10/2009  . TOBACCO ABUSE 12/12/2008  . CORONARY ARTERY DISEASE S/P LAD DES/PTCA 2005 12/12/2008  . CARDIOMYOPATHY 12/12/2008  . ABNORMAL ELECTROCARDIOGRAM 12/12/2008   Past Medical History:  Past Medical History  Diagnosis Date  . Hypertension   . Dilated cardiomyopathy     2/12: EF 30-35%, trivial AI, mild RAE.  EF 2014 40 -45%  . CAD (coronary artery disease)     LHC 9/05 with Dr. Einar Gip:  dLM 20-30%, LAD  85%, oD1 20-30%.  PCI:  Taxus DES to LAD; Dx jailed and tx with POBA.  Last myoview 12/10: inf scar, no ischemia, EF 29%.  . DVT (deep venous thrombosis)   . Pulmonary embolus     chronic coumadin  . Hyperhomocystinemia   . PFO (patent foramen ovale)     Not mentioned on 2014 echo.  [name] HLD (hyperlipidemia)   . Crohn's disease   . Nephrolithiasis   . Diabetes mellitus     11/05/11 "borderline; don't take medications"  . Stroke 1993    "left arm can't hold steady; leg too"  . Arthritis     "used to have a touch in my legs"  . Memory difficulties   . Pneumonia ~ 2011    09-22-13 denies any recent SOB or breathing problems  . Swelling of both ankles      09-22-13 occ.feet, but denies pain.   Past Surgical History:  Past Surgical History  Procedure Laterality Date  . Colon surgery  1994; 1996    "for Crohn's disease"  . Appendectomy     HPI:  Adm 04/15/14 with garbled speech; MRI + left parietotemporal infarct with smaller infarcts rt tempoparietal and bil frontal; chronic infarcts bil basal ganglia and cerebellar, Lt> Rt PMHx- Lt weakness from prior CVA, dementia, CAD, cardiomyopathy with CHF, HTN, DVT with PE (on Xarelto), DM, tobacco use, HTN   Assessment / Plan / Recommendation Clinical Impression  Pt has a severe receptive and expressive aphasia with difficulty identifying common objects from a field of two and following two-step commands, as well as repetition and  verbal output at the word level. Pt required only Min-Mod cues for confrontational naming, however has only intermittent spontaneous vebralizations, which are marked by perseverative errors and phonemic paraphasias. Pt has good emergent awareness of difficulties, which unfortunately makes him easily frustrate.   Pt has difficulty discriminating between pictures and reading at the word level, making utilization of communication boards difficult at this time despite Max multimodal cueing from SLP. To maximize functional  communication at this time, would primarily use basic yes/no questions and simple, one-step directions. Pt will benefit from intensive speech therapy from CIR.    SLP Assessment  Patient needs continued Speech Lanaguage Pathology Services    Follow Up Recommendations  Inpatient Rehab;24 hour supervision/assistance    Frequency and Duration min 2x/week  2 weeks   Pertinent Vitals/Pain Pain Assessment: Faces Faces Pain Scale: No hurt   SLP Goals  Patient/Family Stated Goal: difficulty stating, no family present Potential to Achieve Goals (ACUTE ONLY): Good Potential Considerations (ACUTE ONLY): Severity of impairments  SLP Evaluation Prior Functioning  Cognitive/Linguistic Baseline: Information not available   Cognition  Overall Cognitive Status: Difficult to assess (aphasia) Arousal/Alertness: Awake/alert Orientation Level: Oriented to person Attention: Sustained Sustained Attention: Appears intact Awareness: Appears intact Behaviors: Other (comment) (easily frustrated with communication difficulties)    Comprehension  Auditory Comprehension Overall Auditory Comprehension: Impaired Yes/No Questions: Impaired Basic Biographical Questions: 76-100% accurate Basic Immediate Environment Questions: 75-100% accurate Complex Questions: 50-74% accurate Commands: Impaired One Step Basic Commands: 75-100% accurate Two Step Basic Commands: 50-74% accurate Conversation: Simple EffectiveTechniques: Pausing;Slowed speech;Stressing words Visual Recognition/Discrimination Discrimination: Exceptions to [facility] Common Objects: Unable to indentify Pictures: Unable to indentify Reading Comprehension Reading Status: Impaired Word level: Impaired    Expression Expression Primary Mode of Expression: Verbal Verbal Expression Overall Verbal Expression: Impaired Initiation: No impairment Automatic Speech: Name Level of Generative/Spontaneous Verbalization: Word Repetition: Impaired Level  of Impairment: Word level;Phrase level Naming: Impairment Confrontation: Impaired Common Objects: Unable to indentify Pictures: Unable to indentify Verbal Errors: Phonemic paraphasias;Aware of errors;Other (comment) (anomia) Written Expression Written Expression: Not tested   Oral / Motor Oral Motor/Sensory Function Overall Oral Motor/Sensory Function: Appears within functional limits for tasks assessed Motor Speech Overall Motor Speech: Appears within functional limits for tasks assessed   GO      [name], M.A. CCC-SLP [phone]  [name] 04/18/2014, 11:31 AM

## 2014-04-18 NOTE — Discharge Summary (Signed)
Physician Discharge Summary  [place] Bour WEX:[id number] DOB: 27-Mar-1941 DOA: 04/15/2014  PCP: Lynne Logan, MD  Admit date: 04/15/2014 Discharge date: 04/19/2014   Time spent: 45 minutes  Recommendations for Outpatient Follow-up:  Patient will be discharged to [facility].  Patient will need to follow-up with primary care physician within 1-2 weeks of discharge. Patient will also need to follow-up with Dr. Erlinda Hong, neurologist, within 2 months of discharge.  Patient will need to follow up with Dr. Jeffie Pollock, urologist, within 1-2 weeks.  Patient should continue medications as prescribed. He will need to continue physical activity as recommended by the rehabilitation facility. Patient should follow a heart healthy/carb modified diet.  Discharge Diagnoses:  Acute embolic stroke Essential hypertension Chronic systolic heart failure Recent pulmonary embolism Diabetes mellitus, type II Nonsustained ventricular tachycardia Chronic kidney disease, stage III Abnormal EKG  Discharge Condition: Stable  Diet recommendation: Heart healthy/carb modified  Filed Weights   04/15/14 1629  Weight: 62.823 kg (138 lb 8 oz)    History of present illness:  on 04/15/2014 by Dr. Baldwin [place] Cato is a 74 y.o. male who was brought in as a code stroke. When his wife left him at 1400, he was ok. He then left a garbled message on his son's voicemail at 3:30. 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 He is currently having difficulties with is speech. Denies CP. He was not a candidate for TPA due to Xarelto. ? If patient has run out of xarelto,-- family not sure---, there appears to be a prior auth done on 12/30. And a message left by patient that he could not afford the xarelto on 12/22. ER physician also felt he had EKG changes- denies CP. In the ER, his CT scan of head was negative for bleed. hospitalist were asked to admit  Hospital Course:  Acute embolic stroke -MRI shows an acute ischemic  infarct involving the left parietotemporal region with multiple smaller ischemic infarcts involving the left frontal lobe, bilateral parietal lobes, right temporal occipital region. Underlying embolic disease is suspected -Continue Xarelto and statin -LDL 80, goal less than 70 -Neurology consulted and appreciated -Hemoglobin A1c 6.5 -Echocardiogram: EF 25-30% -Carotid Doppler: 1-39% ICA stenosis, vertebral artery flow is antegrade -PT and OT consulted and recommended inpatient rehabilitation -PMR consulted -Neurology recommended CT abd/pelvis/Chest to rule out malignancy- which were negative -LE doppler: Chronic right DVT, incidental finding equal occlusion of the right mid femoral artery with strong monophasic flow to the distal PTA -Spoke with Mrs. Rushing regarding price of Xarelto. She opted not to switch patient to Coumadin.  I advised her to speak with patient's PCP if she changes her mind after discharge.  -Spoke with cardiology, it also appears that patient failed coumadin in the past.   Essential hypertension -Stable, Continue Coreg, lisinopril  Chronic systolic heart failure -Appears compensated -Echocardiogram EF 25-30% -Continue ACE inhibitor and Coreg -Continue to monitor daily weights and I/O  Recent pulmonary embolism -Continue Xarelto  Diabetes mellitus, type II -Hemoglobin A1c 6.5 -Continue insulin sliding scale CBG monitoring  Nonsustained ventricular tachycardia -Magnesium 1.7, replaced (goal >2), K goal >4 -Cardiology consulted and appreciated; likely secondary to Acute CVA rather than coronary ischemia  Chronic kidney disease, stage III -Creatinine appears to be at baseline  Abnormal EKG -No change from previous  46m Stone with Right hydyronephrosis -Seen on CT abd  -Spoke to urology, Dr. WJeffie Pollock who stated that if patient is not having any pain, he will see  the patient in the office in 1-2 weeks.  If patient begins to develop pain, he should go to the ER  at [place] after discharge. -Started patient on Flomax  Procedures: Echocardiogram Carotid Doppler LE doppler  Consultations: Neurology Cardiology PMR Urology  Discharge Exam: Filed Vitals:   04/19/14 1011  BP: 104/56  Pulse: 61  Temp: 97.9 F (36.6 C)  Resp: 16     General: Well developed, well nourished, NAD, appears stated age  HEENT: NCAT, mucous membranes moist.  Cardiovascular: S1 S2 auscultated, RRR, no murmurs  Respiratory: Clear to auscultation bilaterally with equal chest rise  Abdomen: Soft, nontender, nondistended, + bowel sounds  Extremities: warm dry without cyanosis clubbing or edema  Neuro: AAOx3, Word finding hesitation at time  Psych: Appropriate mood and affect  Discharge Instructions      Discharge Instructions    Ambulatory referral to Neurology    Complete by:  As directed   Pt will follow up with Dr. Erlinda Hong at [facility] in about 2 months. Thanks.     Discharge instructions    Complete by:  As directed   Patient will be discharged to [facility].  Patient will need to follow-up with primary care physician within 1-2 weeks of discharge. Patient will also need to follow-up with Dr. Erlinda Hong, neurologist, within 2 months of discharge.  Patient will need to follow up with Dr. Jeffie Pollock, urologist, within 1-2 weeks.  Patient should continue medications as prescribed. He will need to continue physical activity as recommended by the rehabilitation facility. Patient should follow a heart healthy/carb modified diet.            Medication List    TAKE these medications        acetaminophen 325 MG tablet  Commonly known as:  TYLENOL  Take 2 tablets (650 mg total) by mouth every 6 (six) hours as needed for mild pain, moderate pain or fever.     aspirin 81 MG EC tablet  Take 1 tablet (81 mg total) by mouth daily.     atorvastatin 10 MG tablet  Commonly known as:  LIPITOR  Take 1 tablet (10 mg total) by mouth daily at 6 PM.     carvedilol 3.125 MG  tablet  Commonly known as:  COREG  Take 1 tablet (3.125 mg total) by mouth daily.     donepezil 5 MG tablet  Commonly known as:  ARICEPT  Take 5 mg by mouth at bedtime.     glipiZIDE 5 MG tablet  Commonly known as:  GLUCOTROL  Take 1 tablet (5 mg total) by mouth daily before breakfast.     glucose monitoring kit monitoring kit  - 1 each by Does not apply route as needed for other. Please give 100 lancets and testing strips.   - Check sugars every morning before breakfast and every evening before dinner     latanoprost 0.005 % ophthalmic solution  Commonly known as:  XALATAN  Place 1 drop into both eyes at bedtime.     lisinopril 2.5 MG tablet  Commonly known as:  PRINIVIL,ZESTRIL  Take 1 tablet (2.5 mg total) by mouth every morning.     rivaroxaban 20 MG Tabs tablet  Commonly known as:  XARELTO  Take 1 tablet (20 mg total) by mouth daily with supper.     tamsulosin 0.4 MG Caps capsule  Commonly known as:  FLOMAX  Take 1 capsule (0.4 mg total) by mouth daily.       No  Known Allergies Follow-up Information    Follow up with Lynne Logan, MD. Schedule an appointment as soon as possible for a visit in 1 week.   Specialty:  Family Medicine   Why:  Hospital followup   Contact information:   [place], [place] [address] [phone]       Follow up with Xu,Jindong, MD. Schedule an appointment as soon as possible for a visit in 2 months.   Specialty:  Neurology   Why:  Hospital followup, stroke clinic   Contact information:   [address] [place] [address] [phone]        The results of significant diagnostics from this hospitalization (including imaging, microbiology, ancillary and laboratory) are listed below for reference.    Significant Diagnostic Studies: Ct Head Wo Contrast  04/15/2014   CLINICAL DATA:  Ectasia.  Hypertension.  Coronary artery disease.  EXAM: CT HEAD WITHOUT CONTRAST  TECHNIQUE: Contiguous axial  images were obtained from the base of the skull through the vertex without intravenous contrast.  COMPARISON:  07/17/2013  FINDINGS: Sinuses/Soft tissues: Cerumen in both external ear canals. Clear paranasal sinuses and mastoid air cells.  Intracranial: Advanced cerebral atrophy. Moderate low density in the periventricular white matter likely related to small vessel disease.  Remote left-sided cerebellar infarct. Bilateral basal ganglia lacunar infarcts which are also felt to be chronic. Cortically based infarct involving the right occipital lobe is not significantly changed.  No mass lesion, hemorrhage, hydrocephalus, acute infarct, intra-axial, or extra-axial fluid collection.  IMPRESSION: 1.  No acute intracranial abnormality. 2. Advanced atrophy with remote infarcts and diffuse small vessel ischemic change. These results were called by telephone at the time of interpretation on 04/15/2014 at 4:20 pm to Dr. Armida Sans, who verbally acknowledged these results.   Electronically Signed   By: Abigail Miyamoto M.D.   On: 04/15/2014 16:20   Ct Chest W Contrast  04/18/2014   CLINICAL DATA:  Acute stroke.  EXAM: CT CHEST, ABDOMEN, AND PELVIS WITH CONTRAST  TECHNIQUE: Multidetector CT imaging of the chest, abdomen and pelvis was performed following the standard protocol during bolus administration of intravenous contrast.  CONTRAST:  84m OMNIPAQUE IOHEXOL 300 MG/ML  SOLN  COMPARISON:  None.  CT scans dated 11/20/2013 and 07/13/2013  FINDINGS: CT CHEST FINDINGS  There is a single small area of residual pulmonary embolus in the right lower lobe. The majority of the emboli have resolved. No new emboli. There is an area of scarring and cavitation in the right upper lobe posteriorly which is stable. The extensive infiltrate at the right lung base has resolved with a small peripheral wedge-shaped area of scarring in the right lower lobe posteriorly is also and slight atelectasis or scarring in the same area. The lungs are otherwise  clear. No pulmonary edema or effusions. No hilar or mediastinal adenopathy. Heart size is normal. Moderate coronary artery calcification. No osseous abnormality  CT ABDOMEN AND PELVIS FINDINGS  The liver, spleen and minimally prominent on a chronic basis. The pancreas is otherwise normal. Adrenal glands are normal except for a 8 mm benign appearing cyst in the lateral aspect of the right lobe of the liver. Biliary tree is normal.  There are multiple bilateral renal cysts. There is new moderate right hydronephrosis. The right ureter is dilated into the pelvis or there is a 4 mm stone in the right ureterovesical junction, possibly in a ureterocele. The bladder is otherwise normal. Prostate gland is slightly enlarged.  No  dilated loops of large or small bowel. Previous partial colectomy. No adenopathy. No acute osseous abnormality. Chronic calcification in or around the left S1 nerve root sleeve of unknown etiology.  IMPRESSION: 1. Almost complete resolution of the previously demonstrated pulmonary emboli with a small residual embolus on the right lower lobe. 2. Stable scarring at the right lung apex with slight scarring at the right lung base from previous extensive infiltrate seen on the prior CT scan. 3. New moderate right hydronephrosis due to a 4 mm stone in the right ureterovesical junction.   Electronically Signed   By: Rozetta Nunnery M.D.   On: 04/18/2014 14:23   [name]  04/16/2014   CLINICAL DATA:  Aphasia.  Evaluate for stroke.  Initial evaluation.  EXAM: MRI HEAD WITHOUT CONTRAST  MRA HEAD WITHOUT CONTRAST  TECHNIQUE: Multiplanar, multiecho pulse sequences of the brain and surrounding structures were obtained without intravenous contrast. Angiographic images of the head were obtained using MRA technique without contrast.  COMPARISON:  Prior CT from earlier the same day.  FINDINGS: MRI HEAD FINDINGS  Diffuse prominence of the CSF containing spaces is compatible with advanced generalized cerebral  atrophy patchy and confluent T2/FLAIR hyperintensity within the periventricular and deep white matter both cerebral hemispheres most consistent with chronic small vessel ischemic disease. Overall, these findings have progressed relative to most recent MRI from 2012.  Multiple remote lacunar infarcts present within the basal ganglia bilaterally. Specifically, these involve the posterior limb of the left internal capsule as well as the right thalamus. Probable remote left thalamic infarct present as well. Bilateral cerebellar infarcts present, left slightly worse than right. Encephalomalacia within the high bifrontal regions may reflect remote infarcts as well.  There is abnormal restricted diffusion involving the left parietotemporal region, compatible with acute ischemic infarct. This region of infarct involves primarily the cortical gray matter (series 4, image 26). No associated hemorrhage or significant mass effect. There is localized gyral swelling within the infarcted territory.  Several additional small acute ischemic infarcts present within the right temporal and occipital lobes (series 4, image 16). The largest of these measures 5 mm in the right occipital lobe. Few additional subcentimeter infarcts involve the gray matter more superiorly within the right parietal region (series 4, image 27). Ischemic infarct involving the left frontal lobe appears to be slightly more subacute in nature (series 4, image 29). Possible embolic phenomenon is suspected given the various vascular distributions. No associated hemorrhage.  No mass lesion or midline shift. No extra-axial fluid collection. The ventricular prominence related to global parenchymal volume loss present without hydrocephalus.  Craniocervical junction within normal limits. Degenerative changes noted within the visualized upper cervical spine. Pituitary gland within normal limits. No acute abnormality seen about the orbits.  Paranasal sinuses are clear.  No  mastoid effusion.  MRA HEAD FINDINGS  ANTERIOR CIRCULATION:  In visualized portions of the distal cervical segments of the internal carotid arteries are widely patent bilaterally. The petrous and cavernous segments are widely patent. There is mild multi focal narrowing of the supra clinoid segments bilaterally, left greater than right. A1 segments widely patent. Anterior communicating artery normal. Anterior cerebral arteries well opacified.  Mild multi focal atherosclerotic irregularity present within the M1 segments without hemodynamically significant stenosis. The right M1 segment appears to be widely patent. These changes are greater within the left MCA. Mild diffuse atherosclerotic irregularity seen within the distal MCA branches.  POSTERIOR CIRCULATION:  Left vertebral artery is dominant. There is moderate multi focal  narrowing of the distal right vertebral artery (series 6, image 132). Posterior inferior cerebral arteries patent bilaterally with multi focal atherosclerotic irregularity. Vertebrobasilar junction normal. Mild irregularity present within the basilar artery without focal high-grade stenosis.  Multi focal atherosclerotic irregularity present within the superior cerebellar arteries without occlusion or hemodynamically significant stenosis. P1 segments patent bilaterally. There is moderate short-segment stenosis of the proximal P2 segments bilaterally. P2 segments are opacified distally, although the most distal aspects are poorly evaluated.  No aneurysm or vascular malformation.  IMPRESSION: MRI HEAD IMPRESSION:  1. Acute ischemic infarct involving the left parietotemporal region, with multiple additional smaller ischemic infarcts involving the left frontal lobe, bilateral parietal lobes, and right temporal-occipital region. Underlying embolic disease is suspected given the various vascular distributions. 2. Multiple remote ischemic infarcts involving the bilateral basal ganglia and cerebellar  hemispheres, left greater than right. 3. Advanced cerebral atrophy with chronic small vessel ischemic disease. These changes have progressed relative to most recent MRI from 2012.  MRA HEAD IMPRESSION:  1. No proximal branch occlusion identified within the intracranial circulation. 2. Moderate short-segment stenoses within the proximal P2 segments bilaterally. 3. Mild narrowing of the supra clinoid aspect of the internal carotid arteries bilaterally, left greater than right. 4. Moderate multi focal atherosclerotic narrowing of the distal right vertebral artery. The left vertebral artery is dominant. 5. Multi focal atherosclerotic irregularity involving the M1 segments, distal MCA branches, superior cerebral arteries, and posterior inferior cerebellar arteries.   Electronically Signed   By: Jeannine Boga M.D.   On: 04/16/2014 00:54   Ct Abdomen Pelvis W Contrast  04/18/2014   CLINICAL DATA:  Acute stroke.  EXAM: CT CHEST, ABDOMEN, AND PELVIS WITH CONTRAST  TECHNIQUE: Multidetector CT imaging of the chest, abdomen and pelvis was performed following the standard protocol during bolus administration of intravenous contrast.  CONTRAST:  65m OMNIPAQUE IOHEXOL 300 MG/ML  SOLN  COMPARISON:  None.  CT scans dated 11/20/2013 and 07/13/2013  FINDINGS: CT CHEST FINDINGS  There is a single small area of residual pulmonary embolus in the right lower lobe. The majority of the emboli have resolved. No new emboli. There is an area of scarring and cavitation in the right upper lobe posteriorly which is stable. The extensive infiltrate at the right lung base has resolved with a small peripheral wedge-shaped area of scarring in the right lower lobe posteriorly is also and slight atelectasis or scarring in the same area. The lungs are otherwise clear. No pulmonary edema or effusions. No hilar or mediastinal adenopathy. Heart size is normal. Moderate coronary artery calcification. No osseous abnormality  CT ABDOMEN AND PELVIS  FINDINGS  The liver, spleen and minimally prominent on a chronic basis. The pancreas is otherwise normal. Adrenal glands are normal except for a 8 mm benign appearing cyst in the lateral aspect of the right lobe of the liver. Biliary tree is normal.  There are multiple bilateral renal cysts. There is new moderate right hydronephrosis. The right ureter is dilated into the pelvis or there is a 4 mm stone in the right ureterovesical junction, possibly in a ureterocele. The bladder is otherwise normal. Prostate gland is slightly enlarged.  No dilated loops of large or small bowel. Previous partial colectomy. No adenopathy. No acute osseous abnormality. Chronic calcification in or around the left S1 nerve root sleeve of unknown etiology.  IMPRESSION: 1. Almost complete resolution of the previously demonstrated pulmonary emboli with a small residual embolus on the right lower lobe. 2. Stable scarring at the  right lung apex with slight scarring at the right lung base from previous extensive infiltrate seen on the prior CT scan. 3. New moderate right hydronephrosis due to a 4 mm stone in the right ureterovesical junction.   Electronically Signed   By: Rozetta Nunnery M.D.   On: 04/18/2014 14:23   Dg Chest Portable 1 View  04/15/2014   CLINICAL DATA:  One-day history of altered mental status, initial encounter.  EXAM: PORTABLE CHEST - 1 VIEW  COMPARISON:  11/20/2013  FINDINGS: 1638 hrs. Lungs are hyperexpanded The lungs are clear without focal infiltrate, edema, pneumothorax or pleural effusion. Cardiopericardial silhouette is at upper limits of normal for size. Imaged bony structures of the thorax are intact. Telemetry leads overlie the chest.  IMPRESSION: Hyperexpansion without acute cardiopulmonary findings.   Electronically Signed   By: Misty Stanley M.D.   On: 04/15/2014 16:50   Mr Jodene Nam Head/brain Wo Cm  04/16/2014   CLINICAL DATA:  Aphasia.  Evaluate for stroke.  Initial evaluation.  EXAM: MRI HEAD WITHOUT CONTRAST   MRA HEAD WITHOUT CONTRAST  TECHNIQUE: Multiplanar, multiecho pulse sequences of the brain and surrounding structures were obtained without intravenous contrast. Angiographic images of the head were obtained using MRA technique without contrast.  COMPARISON:  Prior CT from earlier the same day.  FINDINGS: MRI HEAD FINDINGS  Diffuse prominence of the CSF containing spaces is compatible with advanced generalized cerebral atrophy patchy and confluent T2/FLAIR hyperintensity within the periventricular and deep white matter both cerebral hemispheres most consistent with chronic small vessel ischemic disease. Overall, these findings have progressed relative to most recent MRI from 2012.  Multiple remote lacunar infarcts present within the basal ganglia bilaterally. Specifically, these involve the posterior limb of the left internal capsule as well as the right thalamus. Probable remote left thalamic infarct present as well. Bilateral cerebellar infarcts present, left slightly worse than right. Encephalomalacia within the high bifrontal regions may reflect remote infarcts as well.  There is abnormal restricted diffusion involving the left parietotemporal region, compatible with acute ischemic infarct. This region of infarct involves primarily the cortical gray matter (series 4, image 26). No associated hemorrhage or significant mass effect. There is localized gyral swelling within the infarcted territory.  Several additional small acute ischemic infarcts present within the right temporal and occipital lobes (series 4, image 16). The largest of these measures 5 mm in the right occipital lobe. Few additional subcentimeter infarcts involve the gray matter more superiorly within the right parietal region (series 4, image 27). Ischemic infarct involving the left frontal lobe appears to be slightly more subacute in nature (series 4, image 29). Possible embolic phenomenon is suspected given the various vascular distributions. No  associated hemorrhage.  No mass lesion or midline shift. No extra-axial fluid collection. The ventricular prominence related to global parenchymal volume loss present without hydrocephalus.  Craniocervical junction within normal limits. Degenerative changes noted within the visualized upper cervical spine. Pituitary gland within normal limits. No acute abnormality seen about the orbits.  Paranasal sinuses are clear.  No mastoid effusion.  MRA HEAD FINDINGS  ANTERIOR CIRCULATION:  In visualized portions of the distal cervical segments of the internal carotid arteries are widely patent bilaterally. The petrous and cavernous segments are widely patent. There is mild multi focal narrowing of the supra clinoid segments bilaterally, left greater than right. A1 segments widely patent. Anterior communicating artery normal. Anterior cerebral arteries well opacified.  Mild multi focal atherosclerotic irregularity present within the M1 segments without hemodynamically significant  stenosis. The right M1 segment appears to be widely patent. These changes are greater within the left MCA. Mild diffuse atherosclerotic irregularity seen within the distal MCA branches.  POSTERIOR CIRCULATION:  Left vertebral artery is dominant. There is moderate multi focal narrowing of the distal right vertebral artery (series 6, image 132). Posterior inferior cerebral arteries patent bilaterally with multi focal atherosclerotic irregularity. Vertebrobasilar junction normal. Mild irregularity present within the basilar artery without focal high-grade stenosis.  Multi focal atherosclerotic irregularity present within the superior cerebellar arteries without occlusion or hemodynamically significant stenosis. P1 segments patent bilaterally. There is moderate short-segment stenosis of the proximal P2 segments bilaterally. P2 segments are opacified distally, although the most distal aspects are poorly evaluated.  No aneurysm or vascular malformation.   IMPRESSION: MRI HEAD IMPRESSION:  1. Acute ischemic infarct involving the left parietotemporal region, with multiple additional smaller ischemic infarcts involving the left frontal lobe, bilateral parietal lobes, and right temporal-occipital region. Underlying embolic disease is suspected given the various vascular distributions. 2. Multiple remote ischemic infarcts involving the bilateral basal ganglia and cerebellar hemispheres, left greater than right. 3. Advanced cerebral atrophy with chronic small vessel ischemic disease. These changes have progressed relative to most recent MRI from 2012.  MRA HEAD IMPRESSION:  1. No proximal branch occlusion identified within the intracranial circulation. 2. Moderate short-segment stenoses within the proximal P2 segments bilaterally. 3. Mild narrowing of the supra clinoid aspect of the internal carotid arteries bilaterally, left greater than right. 4. Moderate multi focal atherosclerotic narrowing of the distal right vertebral artery. The left vertebral artery is dominant. 5. Multi focal atherosclerotic irregularity involving the M1 segments, distal MCA branches, superior cerebral arteries, and posterior inferior cerebellar arteries.   Electronically Signed   By: Jeannine Boga M.D.   On: 04/16/2014 00:54    Microbiology: No results found for this or any previous visit (from the past 240 hour(s)).   Labs: Basic Metabolic Panel:  Recent Labs Lab 04/15/14 1619 04/15/14 1626 04/16/14 0525 04/17/14 0435 04/18/14 0800 04/18/14 0912  NA 138 139  --  137 138  --   K 4.6 4.6  --  4.1 4.2  --   CL 105 105  --  108 108  --   CO2 25  --   --  25 24  --   GLUCOSE 182* 185*  --  91 146*  --   BUN 17 20  --  18 20  --   CREATININE 1.46* 1.60*  --  1.39* 1.52*  --   CALCIUM 9.1  --   --  8.8 8.8  --   MG  --   --  1.7  --   --  2.2   Liver Function Tests:  Recent Labs Lab 04/15/14 1619  AST 16  ALT 11  ALKPHOS 116  BILITOT 0.6  PROT 6.4  ALBUMIN  3.4*   No results for input(s): LIPASE, AMYLASE in the last 168 hours. No results for input(s): AMMONIA in the last 168 hours. CBC:  Recent Labs Lab 04/15/14 1619 04/15/14 1626  WBC 4.8  --   NEUTROABS 2.6  --   HGB 14.3 15.6  HCT 42.9 46.0  MCV 92.1  --   PLT 138*  --    Cardiac Enzymes:  Recent Labs Lab 04/15/14 1854 04/16/14 0030 04/16/14 0525  TROPONINI <0.03 0.03 0.03   BNP: BNP (last 3 results) No results for input(s): PROBNP in the last 8760 hours. CBG:  Recent Labs Lab 04/18/14  1102 04/18/14 1636 04/18/14 2216 04/19/14 0641 04/19/14 1132  GLUCAP 91 70 72 84 109*       Signed:  Dimetri Armitage  [facility] 04/19/2014, 1:14 PM

## 2014-04-18 NOTE — Clinical Social Work Psychosocial (Signed)
Clinical Social Work Department BRIEF PSYCHOSOCIAL ASSESSMENT 04/18/2014  Patient:  Grant Lara, Grant Lara     Account Number:  [id number]     Admit date:  04/15/2014  Clinical Social Worker:  Glendon Axe, CLINICAL SOCIAL WORKER  Date/Time:  04/18/2014 02:18 PM  Referred by:  Physician  Date Referred:  04/16/2014 Referred for  SNF Placement   Other Referral:   Interview type:  Other - See comment Other interview type:   CSW spoke with pt's wife, [name] via telephone.    PSYCHOSOCIAL DATA Living Status:  WIFE Admitted from facility:   Level of care:   Primary support name:  [facility] Primary support relationship to patient:  SPOUSE Degree of support available:   Strong    CURRENT CONCERNS Current Concerns  Post-Acute Placement   Other Concerns:    SOCIAL WORK ASSESSMENT / PLAN Clinical Social Worker spoke pt's wife via telephone in reference to post-acute placement for SNF. CSW explained CSW role and SNF process. Pt's wife stated she is familiar with SNF process and that pt was a resident at [facility] a few years ago. Pt's wife further reported she does not wish for pt to return to [place]. CSW presented SNF option as alternative plan to CIR. CSW also presented bed offers and pt's wife chooses bed at [facility]. CSW will continue to follow pt and pt's family for continued support and to facilitate pt's discharge needs once medically stable.   Assessment/plan status:  Psychosocial Support/Ongoing Assessment of Needs Other assessment/ plan:   Information/referral to community resources:   SNF information.    PATIENT'S/FAMILY'S RESPONSE TO PLAN OF CARE: Pt's lying in bed disoriented. Pt's wife agreeable to SNF as alternative plan if CIR does not receive insurance approval upon discharge. Pt's wife appreciated social work intervention.       Glendon Axe, MSW, LCSWA [phone] 04/18/2014 2:28 PM

## 2014-04-19 DIAGNOSIS — I639 Cerebral infarction, unspecified: Secondary | ICD-10-CM

## 2014-04-19 DIAGNOSIS — N200 Calculus of kidney: Secondary | ICD-10-CM

## 2014-04-19 LAB — GLUCOSE, CAPILLARY
Glucose-Capillary: 84 mg/dL (ref 70–99)
Glucose-Capillary: 109 mg/dL — ABNORMAL HIGH (ref 70–99)

## 2014-04-19 NOTE — Progress Notes (Signed)
I met with pt and his wife at bedside. Pt doing very well with OT today. Pt unlikely to get approval for inpt rehab admission with [facility]. Pt and wife are in agreement to admission to [facility] today. I notified SW, RN CM and Dr. Ree Kida. We will sign off. [phone]

## 2014-04-19 NOTE — Progress Notes (Signed)
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  Pt most limited with problem solving and communication.  Pt requires min guard when on his feet for most adls due to decreased balance and impulsiveness.  Pt making good progress toward all goals.   Follow Up Recommendations       Equipment Recommendations       Recommendations for Other Services      Precautions / Restrictions Precautions Precautions: Fall Restrictions Weight Bearing Restrictions: No       Mobility Bed Mobility Overal bed mobility: Modified Independent             General bed mobility comments: incr time/effort (appeared moving stiffly)  Transfers Overall transfer level: Needs assistance Equipment used: None Transfers: Sit to/from Stand;Stand Pivot Transfers Sit to Stand: Supervision Stand pivot transfers: Min guard       General transfer comment: during OT session pt with no LOB    Balance Overall balance assessment: Needs assistance Sitting-balance support: Feet supported Sitting balance-Leahy Scale: Good     Standing balance support: Bilateral upper extremity supported;During functional activity Standing balance-Leahy Scale: Fair                     ADL Overall ADL's : Needs assistance/impaired Eating/Feeding: Set up;Sitting   Grooming: Wash/dry hands;Wash/dry face;Sitting;Set up   Upper Body Bathing: Set up;Sitting   Lower Body Bathing: Set up;Sit to/from stand   Upper Body Dressing : Set up;Sitting   Lower Body  Dressing: Sit to/from stand;Minimal assistance Lower Body Dressing Details (indicate cue type and reason): min assist to don socks.  Pt became very frustrated unable to problem solve different ways to donn socks. Toilet Transfer: Min guard;Ambulation;Comfort height toilet   Toileting- Clothing Manipulation and Hygiene: Min guard;Sit to/from stand       Functional mobility during ADLs: Min guard General ADL Comments: Pt bathed at sink with set up and had no LOB when standing in front of the sink.  Pt did have some difficulty donning socks requiring min assist but otherwise ok with LE dressing.        Vision                 Additional Comments: wears glasses   Perception     Praxis      Cognition   Behavior During Therapy: WFL for tasks assessed/performed Overall Cognitive Status: Difficult to assess Area of Impairment: Following commands;Problem solving        Following Commands: Follows one step commands inconsistently;Follows one step commands with increased time     Problem Solving: Slow processing;Requires verbal cues General Comments: Pt more steady on feet in general. Still easily frustrated due to decreased problem solving skills.    Extremity/Trunk Assessment               Exercises     Shoulder Instructions       General Comments      Pertinent Vitals/ Pain       Pain Assessment: No/denies pain  Home Living  Prior Functioning/Environment              Frequency Min 2X/week     Progress Toward Goals  OT Goals(current goals can now be found in the care plan section)  Progress towards OT goals: Progressing toward goals  Acute Rehab OT Goals Patient Stated Goal: unable to state; agrees with PT goals OT Goal Formulation: With patient Time For Goal Achievement: 04/23/14 Potential to Achieve Goals: Good ADL Goals Pt Will Perform Grooming: with set-up;standing Pt Will Perform  Upper Body Dressing: with set-up;sitting;standing Pt Will Perform Lower Body Dressing: with set-up;with supervision;sit to/from stand Pt Will Transfer to Toilet: with supervision;ambulating Pt Will Perform Toileting - Clothing Manipulation and hygiene: with supervision;sit to/from stand  Plan Discharge plan remains appropriate    Co-evaluation                 End of Session Equipment Utilized During Treatment: Gait belt   Activity Tolerance Patient tolerated treatment well   Patient Left in chair;with call bell/phone within reach;with family/visitor present;with chair alarm set   Nurse Communication Mobility status        Time: 8938-1017 OT Time Calculation (min): 33 min  Charges: OT General Charges $OT Visit: 1 Procedure OT Treatments $Self Care/Home Management : 23-37 mins  Glenford Peers 04/19/2014, 12:22 PM  [phone]

## 2014-04-19 NOTE — Progress Notes (Signed)
*  PRELIMINARY RESULTS* Vascular Ultrasound Lower extremity venous duplex has been completed.  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 Does not appear to be acute bilaterally.  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    Landry Mellow, RDMS, RVT  04/19/2014, 9:09 AM

## 2014-04-19 NOTE — Telephone Encounter (Signed)
Dr. Percival Spanish wants you wants you to talk to this pt. He can't afford Xarelto and failed Warfarin

## 2014-04-19 NOTE — Progress Notes (Signed)
Pt being transported via [facility] via stretcher alert, verbal with no noted complaints. IV discontinued applied dry dressing. Discharge paperwork along with personal belongings sent with pt to [facility]. Report called in to nurse.

## 2014-04-19 NOTE — Progress Notes (Signed)
Pt alert, verbal with no noted distress. Sitting up in chair, call bell within reach. Safety measures in place. He denies pain or discomfort. Expressive aphasia persists. Able to follow simple commands.  Report called in to nurse, Sharyn Lull for [facility] at [facility].

## 2014-04-19 NOTE — Clinical Social Work Note (Signed)
Clinical Social Worker facilitated patient discharge including contacting patient family and facility to confirm patient discharge plans.  Clinical information faxed to facility and family agreeable with plan.  CSW arranged ambulance transport via [facility] to [facility].  RN to call report prior to discharge [phone].  Clinical Social Worker will sign off for now as social work intervention is no longer needed. Please consult [place] again if new need arises.  Glendon Axe, MSW, [place] [phone] 04/19/2014 2:13 PM

## 2014-04-19 NOTE — Progress Notes (Signed)
Speech Language Pathology Treatment: Cognitive-Linquistic  Patient Details Name: Grant Lara MRN: [id number] DOB: 1940/07/30 Today's Date: 04/19/2014 Time: 0349-1791 SLP Time Calculation (min) (ACUTE ONLY): 21 min  Assessment / Plan / Recommendation Clinical Impression  Pt seen for f/u aphasia treatment. Pt continues to require Max-Total A for reading at the word level. Pt did receptively identify his first and last name from binary choice. Pt labeled common objects with Min cues, however required Max cues for confrontational naming with pictures. Pt's wife was present for this session, and was educated about pt's current strengths and methods to maximize functional communication.   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 CCC-SLP [phone]  [name] 04/19/2014, 1:44 PM

## 2014-04-20 ENCOUNTER — Encounter: Payer: Self-pay | Admitting: Adult Health

## 2014-04-20 ENCOUNTER — Non-Acute Institutional Stay (SKILLED_NURSING_FACILITY): Payer: Medicare Other | Admitting: Adult Health

## 2014-04-20 DIAGNOSIS — N183 Chronic kidney disease, stage 3 unspecified: Secondary | ICD-10-CM

## 2014-04-20 DIAGNOSIS — I4729 Other ventricular tachycardia: Secondary | ICD-10-CM

## 2014-04-20 DIAGNOSIS — N200 Calculus of kidney: Secondary | ICD-10-CM

## 2014-04-20 DIAGNOSIS — I2699 Other pulmonary embolism without acute cor pulmonale: Secondary | ICD-10-CM

## 2014-04-20 DIAGNOSIS — I82401 Acute embolism and thrombosis of unspecified deep veins of right lower extremity: Secondary | ICD-10-CM

## 2014-04-20 DIAGNOSIS — I10 Essential (primary) hypertension: Secondary | ICD-10-CM

## 2014-04-20 DIAGNOSIS — N189 Chronic kidney disease, unspecified: Secondary | ICD-10-CM

## 2014-04-20 DIAGNOSIS — I639 Cerebral infarction, unspecified: Secondary | ICD-10-CM

## 2014-04-20 DIAGNOSIS — I5022 Chronic systolic (congestive) heart failure: Secondary | ICD-10-CM

## 2014-04-20 DIAGNOSIS — I6340 Cerebral infarction due to embolism of unspecified cerebral artery: Secondary | ICD-10-CM

## 2014-04-20 DIAGNOSIS — E1122 Type 2 diabetes mellitus with diabetic chronic kidney disease: Secondary | ICD-10-CM

## 2014-04-20 DIAGNOSIS — F0390 Unspecified dementia without behavioral disturbance: Secondary | ICD-10-CM

## 2014-04-20 DIAGNOSIS — I472 Ventricular tachycardia: Secondary | ICD-10-CM

## 2014-04-20 NOTE — Progress Notes (Signed)
Patient ID: Grant Lara, male   DOB: Jan 24, 1941, 74 y.o.   MRN: [id number]   04/20/2014  Facility:  [facility]ursing Home Location:  [place] Room Number: 808-P LEVEL OF CARE:  SNF (31)   Chief Complaint  Patient presents with  . Hospitalization Follow-up    Acute embolic stroke, chronic systolic heart failure, pulmonary embolism, diabetes mellitus, nephrolithiasis and CKD    HISTORY OF PRESENT ILLNESS:  This is a 74 year old male who has been admitted to [facility] on 04/19/14 from [facility] with acute embolic stroke. MRI shows acute ischemic infarct involving the left parietotemporal region with multiple smaller ischemic infarcts involving the left frontal lobe, bilateral parietal lobes, right temporal occipital region. He has a history of CVA and DVT/PE and currently on Xarelto.  He has been admitted for a short-term rehabilitation.  PAST MEDICAL HISTORY:  Past Medical History  Diagnosis Date  . Hypertension   . Dilated cardiomyopathy     2/12: EF 30-35%, trivial AI, mild RAE.  EF 2014 40 -45%  . CAD (coronary artery disease)     LHC 9/05 with Dr. Einar Gip:  dLM 20-30%, LAD 85%, oD1 20-30%.  PCI:  Taxus DES to LAD; Dx jailed and tx with POBA.  Last myoview 12/10: inf scar, no ischemia, EF 29%.  . DVT (deep venous thrombosis)   . Pulmonary embolus     chronic coumadin  . Hyperhomocystinemia   . PFO (patent foramen ovale)     Not mentioned on 2014 echo.  [name] HLD (hyperlipidemia)   . Crohn's disease   . Nephrolithiasis   . Diabetes mellitus     11/05/11 "borderline; don't take medications"  . Stroke 1993    "left arm can't hold steady; leg too"  . Arthritis     "used to have a touch in my legs"  . Memory difficulties   . Pneumonia ~ 2011    09-22-13 denies any recent SOB or breathing problems  . Swelling of both ankles      09-22-13 occ.feet, but denies pain.    CURRENT MEDICATIONS: Reviewed per MAR/see medication list  No Known  Allergies   REVIEW OF SYSTEMS: Difficult to obtain due to patient having word finding difficulty  PHYSICAL EXAMINATION  GENERAL: no acute distress, normal body habitus EYES: conjunctivae normal, sclerae normal, normal eye lids NECK: supple, trachea midline, no neck masses, no thyroid tenderness, no thyromegaly LYMPHATICS: no LAN in the neck, no supraclavicular LAN RESPIRATORY: breathing is even & unlabored, BS CTAB CARDIAC: RRR, no murmur,no extra heart sounds, no edema GI: abdomen soft, normal BS, no masses, no tenderness, no hepatomegaly, no splenomegaly EXTREMITIES: Able to move 4 extremities PSYCHIATRIC: the patient is alert & oriented to person, affect & behavior appropriate  LABS/RADIOLOGY: Labs reviewed: Basic Metabolic Panel:  Recent Labs  08/22/13 0822  04/15/14 1619 04/15/14 1626 04/16/14 0525 04/17/14 0435 04/18/14 0800 04/18/14 0912  NA 140  < > 138 139  --  137 138  --   K 4.0  < > 4.6 4.6  --  4.1 4.2  --   CL 109  < > 105 105  --  108 108  --   CO2 20  < > 25  --   --  25 24  --   GLUCOSE 105*  < > 182* 185*  --  91 146*  --   BUN 22  < > 17 20  --  18 20  --  CREATININE 1.23  < > 1.46* 1.60*  --  1.39* 1.52*  --   CALCIUM 8.1*  < > 9.1  --   --  8.8 8.8  --   MG 1.8  --   --   --  1.7  --   --  2.2  PHOS 2.0*  --   --   --   --   --   --   --   < > = values in this interval not displayed. Liver Function Tests:  Recent Labs  08/22/13 0822 11/19/13 2039 04/15/14 1619  AST 7 9 16   ALT 7 6 11   ALKPHOS 75 84 116  BILITOT 0.9 1.7* 0.6  PROT 5.2* 7.0 6.4  ALBUMIN 2.3* 2.7* 3.4*    Recent Labs  07/13/13 0049 07/14/13 0525  LIPASE 68* 29   CBC:  Recent Labs  08/21/13 1900  11/19/13 2039  11/23/13 0325 02/07/14 1155 04/15/14 1619 04/15/14 1626  WBC 9.1  < > 12.2*  < > 7.7 5.5 4.8  --   NEUTROABS 6.8  --  10.6*  --   --   --  2.6  --   HGB 15.2  < > 13.5  < > 11.1* 13.5 14.3 15.6  HCT 45.1  < > 39.8  < > 33.8* 40.7 42.9 46.0  MCV 91.7   < > 93.2  < > 93.9 91.3 92.1  --   PLT 117*  < > 173  < > 238 180 138*  --   < > = values in this interval not displayed.  Lipid Panel:  Recent Labs  04/16/14 0030  HDL 48   Cardiac Enzymes:  Recent Labs  04/15/14 1854 04/16/14 0030 04/16/14 0525  TROPONINI <0.03 0.03 0.03   CBG:  Recent Labs  04/18/14 2216 04/19/14 0641 04/19/14 1132  GLUCAP 72 84 109*    Ct Head Wo Contrast  04/15/2014   CLINICAL DATA:  Ectasia.  Hypertension.  Coronary artery disease.  EXAM: CT HEAD WITHOUT CONTRAST  TECHNIQUE: Contiguous axial images were obtained from the base of the skull through the vertex without intravenous contrast.  COMPARISON:  07/17/2013  FINDINGS: Sinuses/Soft tissues: Cerumen in both external ear canals. Clear paranasal sinuses and mastoid air cells.  Intracranial: Advanced cerebral atrophy. Moderate low density in the periventricular white matter likely related to small vessel disease.  Remote left-sided cerebellar infarct. Bilateral basal ganglia lacunar infarcts which are also felt to be chronic. Cortically based infarct involving the right occipital lobe is not significantly changed.  No mass lesion, hemorrhage, hydrocephalus, acute infarct, intra-axial, or extra-axial fluid collection.  IMPRESSION: 1.  No acute intracranial abnormality. 2. Advanced atrophy with remote infarcts and diffuse small vessel ischemic change. These results were called by telephone at the time of interpretation on 04/15/2014 at 4:20 pm to Dr. Armida Sans, who verbally acknowledged these results.   Electronically Signed   By: Abigail Miyamoto M.D.   On: 04/15/2014 16:20   Ct Chest W Contrast  04/18/2014   CLINICAL DATA:  Acute stroke.  EXAM: CT CHEST, ABDOMEN, AND PELVIS WITH CONTRAST  TECHNIQUE: Multidetector CT imaging of the chest, abdomen and pelvis was performed following the standard protocol during bolus administration of intravenous contrast.  CONTRAST:  35m OMNIPAQUE IOHEXOL 300 MG/ML  SOLN  COMPARISON:   None.  CT scans dated 11/20/2013 and 07/13/2013  FINDINGS: CT CHEST FINDINGS  There is a single small area of residual pulmonary embolus in the right lower lobe. The  majority of the emboli have resolved. No new emboli. There is an area of scarring and cavitation in the right upper lobe posteriorly which is stable. The extensive infiltrate at the right lung base has resolved with a small peripheral wedge-shaped area of scarring in the right lower lobe posteriorly is also and slight atelectasis or scarring in the same area. The lungs are otherwise clear. No pulmonary edema or effusions. No hilar or mediastinal adenopathy. Heart size is normal. Moderate coronary artery calcification. No osseous abnormality  CT ABDOMEN AND PELVIS FINDINGS  The liver, spleen and minimally prominent on a chronic basis. The pancreas is otherwise normal. Adrenal glands are normal except for a 8 mm benign appearing cyst in the lateral aspect of the right lobe of the liver. Biliary tree is normal.  There are multiple bilateral renal cysts. There is new moderate right hydronephrosis. The right ureter is dilated into the pelvis or there is a 4 mm stone in the right ureterovesical junction, possibly in a ureterocele. The bladder is otherwise normal. Prostate gland is slightly enlarged.  No dilated loops of large or small bowel. Previous partial colectomy. No adenopathy. No acute osseous abnormality. Chronic calcification in or around the left S1 nerve root sleeve of unknown etiology.  IMPRESSION: 1. Almost complete resolution of the previously demonstrated pulmonary emboli with a small residual embolus on the right lower lobe. 2. Stable scarring at the right lung apex with slight scarring at the right lung base from previous extensive infiltrate seen on the prior CT scan. 3. New moderate right hydronephrosis due to a 4 mm stone in the right ureterovesical junction.   Electronically Signed   By: Rozetta Nunnery M.D.   On: 04/18/2014 14:23   Mr  Brain Wo Contrast  04/16/2014   CLINICAL DATA:  Aphasia.  Evaluate for stroke.  Initial evaluation.  EXAM: MRI HEAD WITHOUT CONTRAST  MRA HEAD WITHOUT CONTRAST  TECHNIQUE: Multiplanar, multiecho pulse sequences of the brain and surrounding structures were obtained without intravenous contrast. Angiographic images of the head were obtained using MRA technique without contrast.  COMPARISON:  Prior CT from earlier the same day.  FINDINGS: MRI HEAD FINDINGS  Diffuse prominence of the CSF containing spaces is compatible with advanced generalized cerebral atrophy patchy and confluent T2/FLAIR hyperintensity within the periventricular and deep white matter both cerebral hemispheres most consistent with chronic small vessel ischemic disease. Overall, these findings have progressed relative to most recent MRI from 2012.  Multiple remote lacunar infarcts present within the basal ganglia bilaterally. Specifically, these involve the posterior limb of the left internal capsule as well as the right thalamus. Probable remote left thalamic infarct present as well. Bilateral cerebellar infarcts present, left slightly worse than right. Encephalomalacia within the high bifrontal regions may reflect remote infarcts as well.  There is abnormal restricted diffusion involving the left parietotemporal region, compatible with acute ischemic infarct. This region of infarct involves primarily the cortical gray matter (series 4, image 26). No associated hemorrhage or significant mass effect. There is localized gyral swelling within the infarcted territory.  Several additional small acute ischemic infarcts present within the right temporal and occipital lobes (series 4, image 16). The largest of these measures 5 mm in the right occipital lobe. Few additional subcentimeter infarcts involve the gray matter more superiorly within the right parietal region (series 4, image 27). Ischemic infarct involving the left frontal lobe appears to be slightly  more subacute in nature (series 4, image 29). Possible embolic phenomenon is suspected  given the various vascular distributions. No associated hemorrhage.  No mass lesion or midline shift. No extra-axial fluid collection. The ventricular prominence related to global parenchymal volume loss present without hydrocephalus.  Craniocervical junction within normal limits. Degenerative changes noted within the visualized upper cervical spine. Pituitary gland within normal limits. No acute abnormality seen about the orbits.  Paranasal sinuses are clear.  No mastoid effusion.  MRA HEAD FINDINGS  ANTERIOR CIRCULATION:  In visualized portions of the distal cervical segments of the internal carotid arteries are widely patent bilaterally. The petrous and cavernous segments are widely patent. There is mild multi focal narrowing of the supra clinoid segments bilaterally, left greater than right. A1 segments widely patent. Anterior communicating artery normal. Anterior cerebral arteries well opacified.  Mild multi focal atherosclerotic irregularity present within the M1 segments without hemodynamically significant stenosis. The right M1 segment appears to be widely patent. These changes are greater within the left MCA. Mild diffuse atherosclerotic irregularity seen within the distal MCA branches.  POSTERIOR CIRCULATION:  Left vertebral artery is dominant. There is moderate multi focal narrowing of the distal right vertebral artery (series 6, image 132). Posterior inferior cerebral arteries patent bilaterally with multi focal atherosclerotic irregularity. Vertebrobasilar junction normal. Mild irregularity present within the basilar artery without focal high-grade stenosis.  Multi focal atherosclerotic irregularity present within the superior cerebellar arteries without occlusion or hemodynamically significant stenosis. P1 segments patent bilaterally. There is moderate short-segment stenosis of the proximal P2 segments bilaterally.  P2 segments are opacified distally, although the most distal aspects are poorly evaluated.  No aneurysm or vascular malformation.  IMPRESSION: MRI HEAD IMPRESSION:  1. Acute ischemic infarct involving the left parietotemporal region, with multiple additional smaller ischemic infarcts involving the left frontal lobe, bilateral parietal lobes, and right temporal-occipital region. Underlying embolic disease is suspected given the various vascular distributions. 2. Multiple remote ischemic infarcts involving the bilateral basal ganglia and cerebellar hemispheres, left greater than right. 3. Advanced cerebral atrophy with chronic small vessel ischemic disease. These changes have progressed relative to most recent MRI from 2012.  MRA HEAD IMPRESSION:  1. No proximal branch occlusion identified within the intracranial circulation. 2. Moderate short-segment stenoses within the proximal P2 segments bilaterally. 3. Mild narrowing of the supra clinoid aspect of the internal carotid arteries bilaterally, left greater than right. 4. Moderate multi focal atherosclerotic narrowing of the distal right vertebral artery. The left vertebral artery is dominant. 5. Multi focal atherosclerotic irregularity involving the M1 segments, distal MCA branches, superior cerebral arteries, and posterior inferior cerebellar arteries.   Electronically Signed   By: Jeannine Boga M.D.   On: 04/16/2014 00:54   Ct Abdomen Pelvis W Contrast  04/18/2014   CLINICAL DATA:  Acute stroke.  EXAM: CT CHEST, ABDOMEN, AND PELVIS WITH CONTRAST  TECHNIQUE: Multidetector CT imaging of the chest, abdomen and pelvis was performed following the standard protocol during bolus administration of intravenous contrast.  CONTRAST:  23m OMNIPAQUE IOHEXOL 300 MG/ML  SOLN  COMPARISON:  None.  CT scans dated 11/20/2013 and 07/13/2013  FINDINGS: CT CHEST FINDINGS  There is a single small area of residual pulmonary embolus in the right lower lobe. The majority of the  emboli have resolved. No new emboli. There is an area of scarring and cavitation in the right upper lobe posteriorly which is stable. The extensive infiltrate at the right lung base has resolved with a small peripheral wedge-shaped area of scarring in the right lower lobe posteriorly is also and slight atelectasis or scarring  in the same area. The lungs are otherwise clear. No pulmonary edema or effusions. No hilar or mediastinal adenopathy. Heart size is normal. Moderate coronary artery calcification. No osseous abnormality  CT ABDOMEN AND PELVIS FINDINGS  The liver, spleen and minimally prominent on a chronic basis. The pancreas is otherwise normal. Adrenal glands are normal except for a 8 mm benign appearing cyst in the lateral aspect of the right lobe of the liver. Biliary tree is normal.  There are multiple bilateral renal cysts. There is new moderate right hydronephrosis. The right ureter is dilated into the pelvis or there is a 4 mm stone in the right ureterovesical junction, possibly in a ureterocele. The bladder is otherwise normal. Prostate gland is slightly enlarged.  No dilated loops of large or small bowel. Previous partial colectomy. No adenopathy. No acute osseous abnormality. Chronic calcification in or around the left S1 nerve root sleeve of unknown etiology.  IMPRESSION: 1. Almost complete resolution of the previously demonstrated pulmonary emboli with a small residual embolus on the right lower lobe. 2. Stable scarring at the right lung apex with slight scarring at the right lung base from previous extensive infiltrate seen on the prior CT scan. 3. New moderate right hydronephrosis due to a 4 mm stone in the right ureterovesical junction.   Electronically Signed   By: Rozetta Nunnery M.D.   On: 04/18/2014 14:23   Dg Chest Portable 1 View  04/15/2014   CLINICAL DATA:  One-day history of altered mental status, initial encounter.  EXAM: PORTABLE CHEST - 1 VIEW  COMPARISON:  11/20/2013  FINDINGS: 1638  hrs. Lungs are hyperexpanded The lungs are clear without focal infiltrate, edema, pneumothorax or pleural effusion. Cardiopericardial silhouette is at upper limits of normal for size. Imaged bony structures of the thorax are intact. Telemetry leads overlie the chest.  IMPRESSION: Hyperexpansion without acute cardiopulmonary findings.   Electronically Signed   By: Misty Stanley M.D.   On: 04/15/2014 16:50   Mr Jodene Nam Head/brain Wo Cm  04/16/2014   CLINICAL DATA:  Aphasia.  Evaluate for stroke.  Initial evaluation.  EXAM: MRI HEAD WITHOUT CONTRAST  MRA HEAD WITHOUT CONTRAST  TECHNIQUE: Multiplanar, multiecho pulse sequences of the brain and surrounding structures were obtained without intravenous contrast. Angiographic images of the head were obtained using MRA technique without contrast.  COMPARISON:  Prior CT from earlier the same day.  FINDINGS: MRI HEAD FINDINGS  Diffuse prominence of the CSF containing spaces is compatible with advanced generalized cerebral atrophy patchy and confluent T2/FLAIR hyperintensity within the periventricular and deep white matter both cerebral hemispheres most consistent with chronic small vessel ischemic disease. Overall, these findings have progressed relative to most recent MRI from 2012.  Multiple remote lacunar infarcts present within the basal ganglia bilaterally. Specifically, these involve the posterior limb of the left internal capsule as well as the right thalamus. Probable remote left thalamic infarct present as well. Bilateral cerebellar infarcts present, left slightly worse than right. Encephalomalacia within the high bifrontal regions may reflect remote infarcts as well.  There is abnormal restricted diffusion involving the left parietotemporal region, compatible with acute ischemic infarct. This region of infarct involves primarily the cortical gray matter (series 4, image 26). No associated hemorrhage or significant mass effect. There is localized gyral swelling within  the infarcted territory.  Several additional small acute ischemic infarcts present within the right temporal and occipital lobes (series 4, image 16). The largest of these measures 5 mm in the right occipital lobe. Few  additional subcentimeter infarcts involve the gray matter more superiorly within the right parietal region (series 4, image 27). Ischemic infarct involving the left frontal lobe appears to be slightly more subacute in nature (series 4, image 29). Possible embolic phenomenon is suspected given the various vascular distributions. No associated hemorrhage.  No mass lesion or midline shift. No extra-axial fluid collection. The ventricular prominence related to global parenchymal volume loss present without hydrocephalus.  Craniocervical junction within normal limits. Degenerative changes noted within the visualized upper cervical spine. Pituitary gland within normal limits. No acute abnormality seen about the orbits.  Paranasal sinuses are clear.  No mastoid effusion.  MRA HEAD FINDINGS  ANTERIOR CIRCULATION:  In visualized portions of the distal cervical segments of the internal carotid arteries are widely patent bilaterally. The petrous and cavernous segments are widely patent. There is mild multi focal narrowing of the supra clinoid segments bilaterally, left greater than right. A1 segments widely patent. Anterior communicating artery normal. Anterior cerebral arteries well opacified.  Mild multi focal atherosclerotic irregularity present within the M1 segments without hemodynamically significant stenosis. The right M1 segment appears to be widely patent. These changes are greater within the left MCA. Mild diffuse atherosclerotic irregularity seen within the distal MCA branches.  POSTERIOR CIRCULATION:  Left vertebral artery is dominant. There is moderate multi focal narrowing of the distal right vertebral artery (series 6, image 132). Posterior inferior cerebral arteries patent bilaterally with multi  focal atherosclerotic irregularity. Vertebrobasilar junction normal. Mild irregularity present within the basilar artery without focal high-grade stenosis.  Multi focal atherosclerotic irregularity present within the superior cerebellar arteries without occlusion or hemodynamically significant stenosis. P1 segments patent bilaterally. There is moderate short-segment stenosis of the proximal P2 segments bilaterally. P2 segments are opacified distally, although the most distal aspects are poorly evaluated.  No aneurysm or vascular malformation.  IMPRESSION: MRI HEAD IMPRESSION:  1. Acute ischemic infarct involving the left parietotemporal region, with multiple additional smaller ischemic infarcts involving the left frontal lobe, bilateral parietal lobes, and right temporal-occipital region. Underlying embolic disease is suspected given the various vascular distributions. 2. Multiple remote ischemic infarcts involving the bilateral basal ganglia and cerebellar hemispheres, left greater than right. 3. Advanced cerebral atrophy with chronic small vessel ischemic disease. These changes have progressed relative to most recent MRI from 2012.  MRA HEAD IMPRESSION:  1. No proximal branch occlusion identified within the intracranial circulation. 2. Moderate short-segment stenoses within the proximal P2 segments bilaterally. 3. Mild narrowing of the supra clinoid aspect of the internal carotid arteries bilaterally, left greater than right. 4. Moderate multi focal atherosclerotic narrowing of the distal right vertebral artery. The left vertebral artery is dominant. 5. Multi focal atherosclerotic irregularity involving the M1 segments, distal MCA branches, superior cerebral arteries, and posterior inferior cerebellar arteries.   Electronically Signed   By: Jeannine Boga M.D.   On: 04/16/2014 00:54    ASSESSMENT/PLAN:  Acute embolic stroke - for rehabilitation; continuous or alto and follow-up with Dr.Xu -  neurology Essential hypertension - well controlled; continue Coreg and lisinopril Chronic systolic heart failure - compensated, EF 25-30%, continue lisinopril and Coreg Pulmonary embolism and right lower extremity DVT - continue Xarelto Diabetes mellitus, type II - hemoglobin A1c 6.5, continue Glucotrol NSVT - likely secondary to acute CVA Chronic kidney disease, stage III - creatinine 1.52, baseline Nephrolithiasis - follow-up with urology- Dr. Jeffie Pollock Dementia - continue Aricept   Goals of care:  Short-term rehabilitation    Labs/test ordered:  none   Spent  50 minutes in patient care.     [facility], NP [employer] [phone]

## 2014-04-21 ENCOUNTER — Non-Acute Institutional Stay (SKILLED_NURSING_FACILITY): Payer: Medicare Other | Admitting: Internal Medicine

## 2014-04-21 DIAGNOSIS — R531 Weakness: Secondary | ICD-10-CM

## 2014-04-21 DIAGNOSIS — F0150 Vascular dementia without behavioral disturbance: Secondary | ICD-10-CM

## 2014-04-21 DIAGNOSIS — I10 Essential (primary) hypertension: Secondary | ICD-10-CM

## 2014-04-21 DIAGNOSIS — E1122 Type 2 diabetes mellitus with diabetic chronic kidney disease: Secondary | ICD-10-CM

## 2014-04-21 DIAGNOSIS — I639 Cerebral infarction, unspecified: Secondary | ICD-10-CM

## 2014-04-21 DIAGNOSIS — N200 Calculus of kidney: Secondary | ICD-10-CM

## 2014-04-21 DIAGNOSIS — I2699 Other pulmonary embolism without acute cor pulmonale: Secondary | ICD-10-CM

## 2014-04-21 DIAGNOSIS — I6340 Cerebral infarction due to embolism of unspecified cerebral artery: Secondary | ICD-10-CM

## 2014-04-21 DIAGNOSIS — I5022 Chronic systolic (congestive) heart failure: Secondary | ICD-10-CM

## 2014-04-21 DIAGNOSIS — N189 Chronic kidney disease, unspecified: Secondary | ICD-10-CM

## 2014-04-21 NOTE — Telephone Encounter (Signed)
Pt currently in rehab center, called pharmacy for refill information, pt has never picked up Xarelto at 3 retail pharmacies listed in profile.  PA has been approved for 2016.

## 2014-04-21 NOTE — Progress Notes (Signed)
Patient ID: Grant Lara, male   DOB: [birth year], 74 y.o.   MRN: [id number]     [facility]   PCP: Lynne Logan, MD  Code Status: full code  No Known Allergies  Chief Complaint  Patient presents with  . New Admit To SNF     HPI:  74 year old patient is here for short term rehabilitation post hospital admission from 12/22/33-7/0/17 with acute embolic stroke. MRI brain showed acute ischemic infarct involving the left parietotemporal region with multiple smaller ischemic infarcts involving the left frontal lobe, bilateral parietal lobes, right temporal occipital region. He was continued on xarelto and statin. He has a history of CVA, CAD and DVT/PE. He is seen in his room. He has dysarthria limiting his HPI and ROS.  Review of Systems:  Constitutional: Negative for fever, chills, diaphoresis.  HENT: Negative for headache, congestion Eyes: Negative for eye pain, blurred vision, double vision and discharge. wears glasses Respiratory: Negative for cough, shortness of breath and wheezing.   Cardiovascular: Negative for chest pain, palpitations, leg swelling.  Gastrointestinal: Negative for heartburn, nausea, vomiting, abdominal pain Genitourinary: Negative for dysuria Musculoskeletal: Negative for back pain, falls Skin: Negative for itching, rash.  Neurological: Negative for dizziness, tingling, focal weakness Psychiatric/Behavioral: Negative for depression   Past Medical History  Diagnosis Date  . Hypertension   . Dilated cardiomyopathy     2/12: EF 30-35%, trivial AI, mild RAE.  EF 2014 40 -45%  . CAD (coronary artery disease)     LHC 9/05 with Dr. Einar Gip:  dLM 20-30%, LAD 85%, oD1 20-30%.  PCI:  Taxus DES to LAD; Dx jailed and tx with POBA.  Last myoview 12/10: inf scar, no ischemia, EF 29%.  . DVT (deep venous thrombosis)   . Pulmonary embolus     chronic coumadin  . Hyperhomocystinemia   . PFO (patent foramen ovale)     Not mentioned on 2014  echo.  [name] HLD (hyperlipidemia)   . Crohn's disease   . Nephrolithiasis   . Diabetes mellitus     11/05/11 "borderline; don't take medications"  . Stroke 1993    "left arm can't hold steady; leg too"  . Arthritis     "used to have a touch in my legs"  . Memory difficulties   . Pneumonia ~ 2011    09-22-13 denies any recent SOB or breathing problems  . Swelling of both ankles      09-22-13 occ.feet, but denies pain.   Past Surgical History  Procedure Laterality Date  . Colon surgery  1994; 1996    "for Crohn's disease"  . Appendectomy     Social History:   reports that he has been smoking Cigarettes.  He has a 26.5 pack-year smoking history. He has never used smokeless tobacco. He reports that he does not drink alcohol or use illicit drugs.  Family History  Problem Relation Age of Onset  . Diabetes type II Mother   . CAD Father   . Diabetes type II Brother     Medications: Patient's Medications  New Prescriptions   No medications on file  Previous Medications   ACETAMINOPHEN (TYLENOL) 325 MG TABLET    Take 2 tablets (650 mg total) by mouth every 6 (six) hours as needed for mild pain, moderate pain or fever.   ASPIRIN EC 81 MG EC TABLET    Take 1 tablet (81 mg total) by mouth daily.   ATORVASTATIN (LIPITOR) 10 MG TABLET  Take 1 tablet (10 mg total) by mouth daily at 6 PM.   CARVEDILOL (COREG) 3.125 MG TABLET    Take 1 tablet (3.125 mg total) by mouth daily.   DONEPEZIL (ARICEPT) 5 MG TABLET    Take 5 mg by mouth at bedtime.   GLIPIZIDE (GLUCOTROL) 5 MG TABLET    Take 1 tablet (5 mg total) by mouth daily before breakfast.   GLUCOSE MONITORING KIT (FREESTYLE) MONITORING KIT    1 each by Does not apply route as needed for other. Please give 100 lancets and testing strips.  Check sugars every morning before breakfast and every evening before dinner   LATANOPROST (XALATAN) 0.005 % OPHTHALMIC SOLUTION    Place 1 drop into both eyes at bedtime.   LISINOPRIL (PRINIVIL,ZESTRIL) 2.5 MG  TABLET    Take 1 tablet (2.5 mg total) by mouth every morning.   RIVAROXABAN (XARELTO) 20 MG TABS TABLET    Take 1 tablet (20 mg total) by mouth daily with supper.   TAMSULOSIN (FLOMAX) 0.4 MG CAPS CAPSULE    Take 1 capsule (0.4 mg total) by mouth daily.  Modified Medications   No medications on file  Discontinued Medications   No medications on file     Physical Exam: Filed Vitals:   04/21/14 1429  BP: 118/62  Pulse: 60  Temp: 97.2 F (36.2 C)  Resp: 18  SpO2: 97%    General- elderly male, thin built, in no acute distress Head- normocephalic, atraumatic Throat- moist mucus membrane Eyes- PERRLA, EOMI, no pallor, no icterus, no discharge, normal conjunctiva, normal sclera, wears glasses Neck- no cervical lymphadenopathy Cardiovascular- normal s1,s2, no murmurs, palpable dorsalis pedis and radial pulses, no leg edema Respiratory- bilateral clear to auscultation, no wheeze, no rhonchi, no crackles, no use of accessory muscles Abdomen- bowel sounds present, soft, non tender Musculoskeletal- able to move all 4 extremities, lower extremity weakness > upper extremity weakness, LLE> RLE. no spinal and paraspinal tenderness. On a wheelchair Neurological- no focal deficit Skin- warm and dry Psychiatry- alert and oriented, normal mood and affect   Labs reviewed: Basic Metabolic Panel:  Recent Labs  08/22/13 0822  04/15/14 1619 04/15/14 1626 04/16/14 0525 04/17/14 0435 04/18/14 0800 04/18/14 0912  NA 140  < > 138 139  --  137 138  --   K 4.0  < > 4.6 4.6  --  4.1 4.2  --   CL 109  < > 105 105  --  108 108  --   CO2 20  < > 25  --   --  25 24  --   GLUCOSE 105*  < > 182* 185*  --  91 146*  --   BUN 22  < > 17 20  --  18 20  --   CREATININE 1.23  < > 1.46* 1.60*  --  1.39* 1.52*  --   CALCIUM 8.1*  < > 9.1  --   --  8.8 8.8  --   MG 1.8  --   --   --  1.7  --   --  2.2  PHOS 2.0*  --   --   --   --   --   --   --   < > = values in this interval not displayed. Liver  Function Tests:  Recent Labs  08/22/13 0822 11/19/13 2039 04/15/14 1619  AST _0 ALT _1 ALKPHOS 75 84 116  BILITOT 0.9 1.7*  0.6  PROT 5.2* 7.0 6.4  ALBUMIN 2.3* 2.7* 3.4*    Recent Labs  07/13/13 0049 07/14/13 0525  LIPASE 68* 29   No results for input(s): AMMONIA in the last 8760 hours. CBC:  Recent Labs  08/21/13 1900  11/19/13 2039  11/23/13 0325 02/07/14 1155 04/15/14 1619 04/15/14 1626  WBC 9.1  < > 12.2*  < > 7.7 5.5 4.8  --   NEUTROABS 6.8  --  10.6*  --   --   --  2.6  --   HGB 15.2  < > 13.5  < > 11.1* 13.5 14.3 15.6  HCT 45.1  < > 39.8  < > 33.8* 40.7 42.9 46.0  MCV 91.7  < > 93.2  < > 93.9 91.3 92.1  --   PLT 117*  < > 173  < > 238 180 138*  --   < > = values in this interval not displayed. Cardiac Enzymes:  Recent Labs  04/15/14 1854 04/16/14 0030 04/16/14 0525  TROPONINI <0.03 0.03 0.03   BNP: Invalid input(s): POCBNP CBG:  Recent Labs  04/18/14 2216 04/19/14 0641 04/19/14 1132  GLUCAP 72 84 109*    Assessment/Plan  Generalized weakness Post CVA, Will have him work with physical therapy and occupational therapy team to help with gait training and muscle strengthening exercises.fall precautions. Skin care. Encourage to be out of bed.   Acute embolic stroke Continue xarelto and statin. Continue his bp medication. Has f/u with Dr Erlinda Hong from neurology  Nephrolithiasis Currently asymtpomatic. Has f/u with Dr Jeffie Pollock. Continue flomax  Essential hypertension continue Coreg 3.125 mg daily and lisinopril, monitor bp  Chronic systolic heart failure compensated, EF 25-30%, continue lisinopril and Coreg  Pulmonary embolism continue Xarelto  Diabetes mellitus, type II hemoglobin A1c 6.5, continue Glucotrol 5 mg daily and monitor cbg  Vascular Dementia continue Aricept   Goals of care: short term rehabilitation    Labs/tests ordered: bmp in 1 week  Family/ staff Communication: reviewed care plan with patient and nursing  supervisor    Blanchie Serve, MD  [place] [phone] (Monday-Friday 8 am - 5 pm) [phone] (afterhours)

## 2014-05-04 ENCOUNTER — Telehealth: Payer: Self-pay | Admitting: Cardiology

## 2014-05-04 NOTE — Telephone Encounter (Signed)
Returned call to [facility] with [facility]. Clearance will need to be faxed to [phone]

## 2014-05-04 NOTE — Telephone Encounter (Signed)
She says pt needs clarence for his kidney stone surgery.Pt is on Xarelto,can they stop it and if so how long?

## 2014-05-05 ENCOUNTER — Non-Acute Institutional Stay (SKILLED_NURSING_FACILITY): Payer: Medicare Other | Admitting: Adult Health

## 2014-05-05 ENCOUNTER — Encounter: Payer: Self-pay | Admitting: Adult Health

## 2014-05-05 DIAGNOSIS — F0390 Unspecified dementia without behavioral disturbance: Secondary | ICD-10-CM

## 2014-05-05 DIAGNOSIS — I2699 Other pulmonary embolism without acute cor pulmonale: Secondary | ICD-10-CM

## 2014-05-05 DIAGNOSIS — F0150 Vascular dementia without behavioral disturbance: Secondary | ICD-10-CM

## 2014-05-05 DIAGNOSIS — N200 Calculus of kidney: Secondary | ICD-10-CM

## 2014-05-05 DIAGNOSIS — I639 Cerebral infarction, unspecified: Secondary | ICD-10-CM

## 2014-05-05 DIAGNOSIS — E875 Hyperkalemia: Secondary | ICD-10-CM

## 2014-05-05 DIAGNOSIS — E1122 Type 2 diabetes mellitus with diabetic chronic kidney disease: Secondary | ICD-10-CM

## 2014-05-05 DIAGNOSIS — R531 Weakness: Secondary | ICD-10-CM

## 2014-05-05 DIAGNOSIS — I5022 Chronic systolic (congestive) heart failure: Secondary | ICD-10-CM

## 2014-05-05 DIAGNOSIS — N189 Chronic kidney disease, unspecified: Secondary | ICD-10-CM

## 2014-05-05 DIAGNOSIS — I10 Essential (primary) hypertension: Secondary | ICD-10-CM

## 2014-05-05 DIAGNOSIS — I82401 Acute embolism and thrombosis of unspecified deep veins of right lower extremity: Secondary | ICD-10-CM

## 2014-05-05 DIAGNOSIS — I6340 Cerebral infarction due to embolism of unspecified cerebral artery: Secondary | ICD-10-CM

## 2014-05-05 NOTE — Progress Notes (Signed)
Patient ID: Grant Lara, male   DOB: [birth year], 74 y.o.   MRN: [id number]   05/05/2014  Facility:  [facility] Home Location:  [place] Room Number: 808-P LEVEL OF CARE:  SNF (31)   Chief Complaint  Patient presents with  . Discharge Note    Generalized weakness, stroke, hypertension, chronic systolic heart failure, pulmonary embolism, diabetes mellitus, nephrolithiasis and dementia    HISTORY OF PRESENT ILLNESS:  This is a 74 year old male who is for discharge home with home health PT, OT and CNA. He has been admitted to [facility] on 04/19/14 from [facility] with acute embolic stroke. MRI shows acute ischemic infarct involving the left parietotemporal region with multiple smaller ischemic infarcts involving the left frontal lobe, bilateral parietal lobes, right temporal occipital region. He has a history of CVA and DVT/PE and currently on Xarelto.Patient was admitted to this facility for short-term rehabilitation after the patient's recent hospitalization.  Patient has completed SNF rehabilitation and therapy has cleared the patient for discharge.     PAST MEDICAL HISTORY:  Past Medical History  Diagnosis Date  . Hypertension   . Dilated cardiomyopathy     2/12: EF 30-35%, trivial AI, mild RAE.  EF 2014 40 -45%  . CAD (coronary artery disease)     LHC 9/05 with Dr. Einar Gip:  dLM 20-30%, LAD 85%, oD1 20-30%.  PCI:  Taxus DES to LAD; Dx jailed and tx with POBA.  Last myoview 12/10: inf scar, no ischemia, EF 29%.  . DVT (deep venous thrombosis)   . Pulmonary embolus     chronic coumadin  . Hyperhomocystinemia   . PFO (patent foramen ovale)     Not mentioned on 2014 echo.  [name] HLD (hyperlipidemia)   . Crohn's disease   . Nephrolithiasis   . Diabetes mellitus     11/05/11 "borderline; don't take medications"  . Stroke 1993    "left arm can't hold steady; leg too"  . Arthritis     "used to have a touch in my legs"  . Memory difficulties   .  Pneumonia ~ 2011    09-22-13 denies any recent SOB or breathing problems  . Swelling of both ankles      09-22-13 occ.feet, but denies pain.    CURRENT MEDICATIONS: Reviewed per MAR/see medication list  No Known Allergies   REVIEW OF SYSTEMS: Difficult to obtain due to patient having word finding difficulty  PHYSICAL EXAMINATION  GENERAL: no acute distress, normal body habitus NECK: supple, trachea midline, no neck masses, no thyroid tenderness, no thyromegaly LYMPHATICS: no LAN in the neck, no supraclavicular LAN RESPIRATORY: breathing is even & unlabored, BS CTAB CARDIAC: RRR, no murmur,no extra heart sounds, no edema GI: abdomen soft, normal BS, no masses, no tenderness, no hepatomegaly, no splenomegaly EXTREMITIES: Able to move 4 extremities PSYCHIATRIC: the patient is alert & oriented to person, affect & behavior appropriate  LABS/RADIOLOGY:  05/03/14  sodium 139 potassium 5.4 glucose 100 BUN 32 creatinine 1.7 calcium 9.7 GFR 43.08 04/28/14  sodium 135 potassium 4.8 glucose 150 BUN 31 creatinine 1.6 calcium 9.3 04/25/14  sodium 136 potassium 4.9 glucose 88 BUN 29 creatinine 1.7 calcium 9.1 WBC 5.5 hemoglobin 12.9 hematocrit 39.9 MCV 93.4 04/22/14  chest x-ray shows no evidence of congestive heart failure; patchy airspace opacities Mead and lower lungs bilaterally with possible ill-defined nodule at component on the right. Labs reviewed: Basic Metabolic Panel:  Recent Labs  08/22/13 0822  04/15/14 1619  04/15/14 1626 04/16/14 0525 04/17/14 0435 04/18/14 0800 04/18/14 0912  NA 140  < > 138 139  --  137 138  --   K 4.0  < > 4.6 4.6  --  4.1 4.2  --   CL 109  < > 105 105  --  108 108  --   CO2 20  < > 25  --   --  25 24  --   GLUCOSE 105*  < > 182* 185*  --  91 146*  --   BUN 22  < > 17 20  --  18 20  --   CREATININE 1.23  < > 1.46* 1.60*  --  1.39* 1.52*  --   CALCIUM 8.1*  < > 9.1  --   --  8.8 8.8  --   MG 1.8  --   --   --  1.7  --   --  2.2  PHOS 2.0*  --   --   --    --   --   --   --   < > = values in this interval not displayed. Liver Function Tests:  Recent Labs  08/22/13 0822 11/19/13 2039 04/15/14 1619  AST 7 9 16   ALT 7 6 11   ALKPHOS 75 84 116  BILITOT 0.9 1.7* 0.6  PROT 5.2* 7.0 6.4  ALBUMIN 2.3* 2.7* 3.4*    Recent Labs  07/13/13 0049 07/14/13 0525  LIPASE 68* 29   CBC:  Recent Labs  08/21/13 1900  11/19/13 2039  11/23/13 0325 02/07/14 1155 04/15/14 1619 04/15/14 1626  WBC 9.1  < > 12.2*  < > 7.7 5.5 4.8  --   NEUTROABS 6.8  --  10.6*  --   --   --  2.6  --   HGB 15.2  < > 13.5  < > 11.1* 13.5 14.3 15.6  HCT 45.1  < > 39.8  < > 33.8* 40.7 42.9 46.0  MCV 91.7  < > 93.2  < > 93.9 91.3 92.1  --   PLT 117*  < > 173  < > 238 180 138*  --   < > = values in this interval not displayed.  Lipid Panel:  Recent Labs  04/16/14 0030  HDL 48   Cardiac Enzymes:  Recent Labs  04/15/14 1854 04/16/14 0030 04/16/14 0525  TROPONINI <0.03 0.03 0.03   CBG:  Recent Labs  04/18/14 2216 04/19/14 0641 04/19/14 1132  GLUCAP 72 84 109*    Ct Head Wo Contrast  04/15/2014   CLINICAL DATA:  Ectasia.  Hypertension.  Coronary artery disease.  EXAM: CT HEAD WITHOUT CONTRAST  TECHNIQUE: Contiguous axial images were obtained from the base of the skull through the vertex without intravenous contrast.  COMPARISON:  07/17/2013  FINDINGS: Sinuses/Soft tissues: Cerumen in both external ear canals. Clear paranasal sinuses and mastoid air cells.  Intracranial: Advanced cerebral atrophy. Moderate low density in the periventricular white matter likely related to small vessel disease.  Remote left-sided cerebellar infarct. Bilateral basal ganglia lacunar infarcts which are also felt to be chronic. Cortically based infarct involving the right occipital lobe is not significantly changed.  No mass lesion, hemorrhage, hydrocephalus, acute infarct, intra-axial, or extra-axial fluid collection.  IMPRESSION: 1.  No acute intracranial abnormality. 2.  Advanced atrophy with remote infarcts and diffuse small vessel ischemic change. These results were called by telephone at the time of interpretation on 04/15/2014 at 4:20 pm to Dr. Armida Sans, who verbally  acknowledged these results.   Electronically Signed   By: Abigail Miyamoto M.D.   On: 04/15/2014 16:20   Ct Chest W Contrast  04/18/2014   CLINICAL DATA:  Acute stroke.  EXAM: CT CHEST, ABDOMEN, AND PELVIS WITH CONTRAST  TECHNIQUE: Multidetector CT imaging of the chest, abdomen and pelvis was performed following the standard protocol during bolus administration of intravenous contrast.  CONTRAST:  44m OMNIPAQUE IOHEXOL 300 MG/ML  SOLN  COMPARISON:  None.  CT scans dated 11/20/2013 and 07/13/2013  FINDINGS: CT CHEST FINDINGS  There is a single small area of residual pulmonary embolus in the right lower lobe. The majority of the emboli have resolved. No new emboli. There is an area of scarring and cavitation in the right upper lobe posteriorly which is stable. The extensive infiltrate at the right lung base has resolved with a small peripheral wedge-shaped area of scarring in the right lower lobe posteriorly is also and slight atelectasis or scarring in the same area. The lungs are otherwise clear. No pulmonary edema or effusions. No hilar or mediastinal adenopathy. Heart size is normal. Moderate coronary artery calcification. No osseous abnormality  CT ABDOMEN AND PELVIS FINDINGS  The liver, spleen and minimally prominent on a chronic basis. The pancreas is otherwise normal. Adrenal glands are normal except for a 8 mm benign appearing cyst in the lateral aspect of the right lobe of the liver. Biliary tree is normal.  There are multiple bilateral renal cysts. There is new moderate right hydronephrosis. The right ureter is dilated into the pelvis or there is a 4 mm stone in the right ureterovesical junction, possibly in a ureterocele. The bladder is otherwise normal. Prostate gland is slightly enlarged.  No dilated loops of  large or small bowel. Previous partial colectomy. No adenopathy. No acute osseous abnormality. Chronic calcification in or around the left S1 nerve root sleeve of unknown etiology.  IMPRESSION: 1. Almost complete resolution of the previously demonstrated pulmonary emboli with a small residual embolus on the right lower lobe. 2. Stable scarring at the right lung apex with slight scarring at the right lung base from previous extensive infiltrate seen on the prior CT scan. 3. New moderate right hydronephrosis due to a 4 mm stone in the right ureterovesical junction.   Electronically Signed   By: JRozetta NunneryM.D.   On: 04/18/2014 14:23   [name]  04/16/2014   CLINICAL DATA:  Aphasia.  Evaluate for stroke.  Initial evaluation.  EXAM: MRI HEAD WITHOUT CONTRAST  MRA HEAD WITHOUT CONTRAST  TECHNIQUE: Multiplanar, multiecho pulse sequences of the brain and surrounding structures were obtained without intravenous contrast. Angiographic images of the head were obtained using MRA technique without contrast.  COMPARISON:  Prior CT from earlier the same day.  FINDINGS: MRI HEAD FINDINGS  Diffuse prominence of the CSF containing spaces is compatible with advanced generalized cerebral atrophy patchy and confluent T2/FLAIR hyperintensity within the periventricular and deep white matter both cerebral hemispheres most consistent with chronic small vessel ischemic disease. Overall, these findings have progressed relative to most recent MRI from 2012.  Multiple remote lacunar infarcts present within the basal ganglia bilaterally. Specifically, these involve the posterior limb of the left internal capsule as well as the right thalamus. Probable remote left thalamic infarct present as well. Bilateral cerebellar infarcts present, left slightly worse than right. Encephalomalacia within the high bifrontal regions may reflect remote infarcts as well.  There is abnormal restricted diffusion involving the left parietotemporal  region,  compatible with acute ischemic infarct. This region of infarct involves primarily the cortical gray matter (series 4, image 26). No associated hemorrhage or significant mass effect. There is localized gyral swelling within the infarcted territory.  Several additional small acute ischemic infarcts present within the right temporal and occipital lobes (series 4, image 16). The largest of these measures 5 mm in the right occipital lobe. Few additional subcentimeter infarcts involve the gray matter more superiorly within the right parietal region (series 4, image 27). Ischemic infarct involving the left frontal lobe appears to be slightly more subacute in nature (series 4, image 29). Possible embolic phenomenon is suspected given the various vascular distributions. No associated hemorrhage.  No mass lesion or midline shift. No extra-axial fluid collection. The ventricular prominence related to global parenchymal volume loss present without hydrocephalus.  Craniocervical junction within normal limits. Degenerative changes noted within the visualized upper cervical spine. Pituitary gland within normal limits. No acute abnormality seen about the orbits.  Paranasal sinuses are clear.  No mastoid effusion.  MRA HEAD FINDINGS  ANTERIOR CIRCULATION:  In visualized portions of the distal cervical segments of the internal carotid arteries are widely patent bilaterally. The petrous and cavernous segments are widely patent. There is mild multi focal narrowing of the supra clinoid segments bilaterally, left greater than right. A1 segments widely patent. Anterior communicating artery normal. Anterior cerebral arteries well opacified.  Mild multi focal atherosclerotic irregularity present within the M1 segments without hemodynamically significant stenosis. The right M1 segment appears to be widely patent. These changes are greater within the left MCA. Mild diffuse atherosclerotic irregularity seen within the distal MCA  branches.  POSTERIOR CIRCULATION:  Left vertebral artery is dominant. There is moderate multi focal narrowing of the distal right vertebral artery (series 6, image 132). Posterior inferior cerebral arteries patent bilaterally with multi focal atherosclerotic irregularity. Vertebrobasilar junction normal. Mild irregularity present within the basilar artery without focal high-grade stenosis.  Multi focal atherosclerotic irregularity present within the superior cerebellar arteries without occlusion or hemodynamically significant stenosis. P1 segments patent bilaterally. There is moderate short-segment stenosis of the proximal P2 segments bilaterally. P2 segments are opacified distally, although the most distal aspects are poorly evaluated.  No aneurysm or vascular malformation.  IMPRESSION: MRI HEAD IMPRESSION:  1. Acute ischemic infarct involving the left parietotemporal region, with multiple additional smaller ischemic infarcts involving the left frontal lobe, bilateral parietal lobes, and right temporal-occipital region. Underlying embolic disease is suspected given the various vascular distributions. 2. Multiple remote ischemic infarcts involving the bilateral basal ganglia and cerebellar hemispheres, left greater than right. 3. Advanced cerebral atrophy with chronic small vessel ischemic disease. These changes have progressed relative to most recent MRI from 2012.  MRA HEAD IMPRESSION:  1. No proximal branch occlusion identified within the intracranial circulation. 2. Moderate short-segment stenoses within the proximal P2 segments bilaterally. 3. Mild narrowing of the supra clinoid aspect of the internal carotid arteries bilaterally, left greater than right. 4. Moderate multi focal atherosclerotic narrowing of the distal right vertebral artery. The left vertebral artery is dominant. 5. Multi focal atherosclerotic irregularity involving the M1 segments, distal MCA branches, superior cerebral arteries, and posterior  inferior cerebellar arteries.   Electronically Signed   By: Jeannine Boga M.D.   On: 04/16/2014 00:54   Ct Abdomen Pelvis W Contrast  04/18/2014   CLINICAL DATA:  Acute stroke.  EXAM: CT CHEST, ABDOMEN, AND PELVIS WITH CONTRAST  TECHNIQUE: Multidetector CT imaging of the chest, abdomen and pelvis was performed following  the standard protocol during bolus administration of intravenous contrast.  CONTRAST:  66m OMNIPAQUE IOHEXOL 300 MG/ML  SOLN  COMPARISON:  None.  CT scans dated 11/20/2013 and 07/13/2013  FINDINGS: CT CHEST FINDINGS  There is a single small area of residual pulmonary embolus in the right lower lobe. The majority of the emboli have resolved. No new emboli. There is an area of scarring and cavitation in the right upper lobe posteriorly which is stable. The extensive infiltrate at the right lung base has resolved with a small peripheral wedge-shaped area of scarring in the right lower lobe posteriorly is also and slight atelectasis or scarring in the same area. The lungs are otherwise clear. No pulmonary edema or effusions. No hilar or mediastinal adenopathy. Heart size is normal. Moderate coronary artery calcification. No osseous abnormality  CT ABDOMEN AND PELVIS FINDINGS  The liver, spleen and minimally prominent on a chronic basis. The pancreas is otherwise normal. Adrenal glands are normal except for a 8 mm benign appearing cyst in the lateral aspect of the right lobe of the liver. Biliary tree is normal.  There are multiple bilateral renal cysts. There is new moderate right hydronephrosis. The right ureter is dilated into the pelvis or there is a 4 mm stone in the right ureterovesical junction, possibly in a ureterocele. The bladder is otherwise normal. Prostate gland is slightly enlarged.  No dilated loops of large or small bowel. Previous partial colectomy. No adenopathy. No acute osseous abnormality. Chronic calcification in or around the left S1 nerve root sleeve of unknown etiology.   IMPRESSION: 1. Almost complete resolution of the previously demonstrated pulmonary emboli with a small residual embolus on the right lower lobe. 2. Stable scarring at the right lung apex with slight scarring at the right lung base from previous extensive infiltrate seen on the prior CT scan. 3. New moderate right hydronephrosis due to a 4 mm stone in the right ureterovesical junction.   Electronically Signed   By: JRozetta NunneryM.D.   On: 04/18/2014 14:23   Dg Chest Portable 1 View  04/15/2014   CLINICAL DATA:  One-day history of altered mental status, initial encounter.  EXAM: PORTABLE CHEST - 1 VIEW  COMPARISON:  11/20/2013  FINDINGS: 1638 hrs. Lungs are hyperexpanded The lungs are clear without focal infiltrate, edema, pneumothorax or pleural effusion. Cardiopericardial silhouette is at upper limits of normal for size. Imaged bony structures of the thorax are intact. Telemetry leads overlie the chest.  IMPRESSION: Hyperexpansion without acute cardiopulmonary findings.   Electronically Signed   By: EMisty StanleyM.D.   On: 04/15/2014 16:50   Mr MJodene NamHead/brain Wo Cm  04/16/2014   CLINICAL DATA:  Aphasia.  Evaluate for stroke.  Initial evaluation.  EXAM: MRI HEAD WITHOUT CONTRAST  MRA HEAD WITHOUT CONTRAST  TECHNIQUE: Multiplanar, multiecho pulse sequences of the brain and surrounding structures were obtained without intravenous contrast. Angiographic images of the head were obtained using MRA technique without contrast.  COMPARISON:  Prior CT from earlier the same day.  FINDINGS: MRI HEAD FINDINGS  Diffuse prominence of the CSF containing spaces is compatible with advanced generalized cerebral atrophy patchy and confluent T2/FLAIR hyperintensity within the periventricular and deep white matter both cerebral hemispheres most consistent with chronic small vessel ischemic disease. Overall, these findings have progressed relative to most recent MRI from 2012.  Multiple remote lacunar infarcts present within the  basal ganglia bilaterally. Specifically, these involve the posterior limb of the left internal capsule as well as the  right thalamus. Probable remote left thalamic infarct present as well. Bilateral cerebellar infarcts present, left slightly worse than right. Encephalomalacia within the high bifrontal regions may reflect remote infarcts as well.  There is abnormal restricted diffusion involving the left parietotemporal region, compatible with acute ischemic infarct. This region of infarct involves primarily the cortical gray matter (series 4, image 26). No associated hemorrhage or significant mass effect. There is localized gyral swelling within the infarcted territory.  Several additional small acute ischemic infarcts present within the right temporal and occipital lobes (series 4, image 16). The largest of these measures 5 mm in the right occipital lobe. Few additional subcentimeter infarcts involve the gray matter more superiorly within the right parietal region (series 4, image 27). Ischemic infarct involving the left frontal lobe appears to be slightly more subacute in nature (series 4, image 29). Possible embolic phenomenon is suspected given the various vascular distributions. No associated hemorrhage.  No mass lesion or midline shift. No extra-axial fluid collection. The ventricular prominence related to global parenchymal volume loss present without hydrocephalus.  Craniocervical junction within normal limits. Degenerative changes noted within the visualized upper cervical spine. Pituitary gland within normal limits. No acute abnormality seen about the orbits.  Paranasal sinuses are clear.  No mastoid effusion.  MRA HEAD FINDINGS  ANTERIOR CIRCULATION:  In visualized portions of the distal cervical segments of the internal carotid arteries are widely patent bilaterally. The petrous and cavernous segments are widely patent. There is mild multi focal narrowing of the supra clinoid segments bilaterally, left  greater than right. A1 segments widely patent. Anterior communicating artery normal. Anterior cerebral arteries well opacified.  Mild multi focal atherosclerotic irregularity present within the M1 segments without hemodynamically significant stenosis. The right M1 segment appears to be widely patent. These changes are greater within the left MCA. Mild diffuse atherosclerotic irregularity seen within the distal MCA branches.  POSTERIOR CIRCULATION:  Left vertebral artery is dominant. There is moderate multi focal narrowing of the distal right vertebral artery (series 6, image 132). Posterior inferior cerebral arteries patent bilaterally with multi focal atherosclerotic irregularity. Vertebrobasilar junction normal. Mild irregularity present within the basilar artery without focal high-grade stenosis.  Multi focal atherosclerotic irregularity present within the superior cerebellar arteries without occlusion or hemodynamically significant stenosis. P1 segments patent bilaterally. There is moderate short-segment stenosis of the proximal P2 segments bilaterally. P2 segments are opacified distally, although the most distal aspects are poorly evaluated.  No aneurysm or vascular malformation.  IMPRESSION: MRI HEAD IMPRESSION:  1. Acute ischemic infarct involving the left parietotemporal region, with multiple additional smaller ischemic infarcts involving the left frontal lobe, bilateral parietal lobes, and right temporal-occipital region. Underlying embolic disease is suspected given the various vascular distributions. 2. Multiple remote ischemic infarcts involving the bilateral basal ganglia and cerebellar hemispheres, left greater than right. 3. Advanced cerebral atrophy with chronic small vessel ischemic disease. These changes have progressed relative to most recent MRI from 2012.  MRA HEAD IMPRESSION:  1. No proximal branch occlusion identified within the intracranial circulation. 2. Moderate short-segment stenoses  within the proximal P2 segments bilaterally. 3. Mild narrowing of the supra clinoid aspect of the internal carotid arteries bilaterally, left greater than right. 4. Moderate multi focal atherosclerotic narrowing of the distal right vertebral artery. The left vertebral artery is dominant. 5. Multi focal atherosclerotic irregularity involving the M1 segments, distal MCA branches, superior cerebral arteries, and posterior inferior cerebellar arteries.   Electronically Signed   By: Pincus Badder.D.  On: 04/16/2014 00:54    ASSESSMENT/PLAN:  Generalized weakness - for home health PT, OT and CNA Acute embolic stroke -  Continue Xarelto and follow-up with Dr.Xu - neurology Essential hypertension - well controlled; continue Coreg and lisinopril Chronic systolic heart failure - compensated, EF 25-30%, continue lisinopril and Coreg Pulmonary embolism and right lower extremity DVT - continue Xarelto Diabetes mellitus, type II - hemoglobin A1c 6.5, continue Glucotrol Nephrolithiasis - follow-up with urology- Dr. Jeffie Pollock Dementia - continue Aricept Hyperkalemia - Kayexalate 15 g/60 mL by mouth 1; BMP after   I have filled out patient's discharge paperwork and written prescriptions.  Patient will receive home health PT, OT and CNA.  Total discharge time: Greater than 30 minutes  Discharge time involved coordination of the discharge process with social worker, nursing staff and therapy department. Medical justification for home health services verified.    [facility], NP [employer] [phone]

## 2014-05-09 NOTE — Telephone Encounter (Signed)
The patient needs to be seen in the office.  I gave the message to [facility].

## 2014-05-10 NOTE — Telephone Encounter (Signed)
appt made for surgical clearance

## 2014-05-11 ENCOUNTER — Telehealth: Payer: Self-pay

## 2014-05-11 ENCOUNTER — Telehealth: Payer: Self-pay | Admitting: Cardiology

## 2014-05-11 NOTE — Telephone Encounter (Signed)
Patient called for samples of xarelto 20 mg gave to patient up front

## 2014-05-11 NOTE — Telephone Encounter (Signed)
Error

## 2014-05-17 ENCOUNTER — Ambulatory Visit (INDEPENDENT_AMBULATORY_CARE_PROVIDER_SITE_OTHER): Payer: Medicare Other | Admitting: Cardiology

## 2014-05-17 ENCOUNTER — Encounter: Payer: Self-pay | Admitting: Cardiology

## 2014-05-17 VITALS — BP 100/60 | HR 51 | Ht 68.0 in | Wt 132.7 lb

## 2014-05-17 DIAGNOSIS — I429 Cardiomyopathy, unspecified: Secondary | ICD-10-CM

## 2014-05-17 MED ORDER — RIVAROXABAN 20 MG PO TABS: 20.0000 mg | ORAL_TABLET | Freq: Every day | ORAL | Status: DC

## 2014-05-17 NOTE — Progress Notes (Signed)
HPI The patient presents for follow up after a hospitalization for CVA last month.    I have reviewed these records.  MRI showed an acute ischemic infarct involving the left parietotemporal region with multiple smaller ischemic infarcts involving the left frontal lobe, bilateral parietal lobes, right temporal occipital region. Underlying embolic disease was suspected   I was asked to comment recently on holding his anticoagulation for treatment of kidney stones. I added him to my schedule. The patient denies any acute cardiovascular symptoms such as chest discomfort, neck or arm discomfort. He's not reporting any shortness of breath , PND or orthopnea. He's not having any palpitations. However, he's having problems with his speech. His wife reports that he's having increasing dementia. There has not been any urinary urgency or pain. He's had no hematuria. He did recently have protein in his urine however.   No Known Allergies  Current Outpatient Prescriptions  Medication Sig Dispense Refill  . acetaminophen (TYLENOL) 325 MG tablet Take 2 tablets (650 mg total) by mouth every 6 (six) hours as needed for mild pain, moderate pain or fever.    [name] aspirin EC 81 MG EC tablet Take 1 tablet (81 mg total) by mouth daily. 30 tablet 0  . atorvastatin (LIPITOR) 10 MG tablet Take 1 tablet (10 mg total) by mouth daily at 6 PM. 30 tablet 0  . carvedilol (COREG) 3.125 MG tablet Take 1 tablet (3.125 mg total) by mouth daily. 90 tablet 3  . donepezil (ARICEPT) 5 MG tablet Take 5 mg by mouth at bedtime.    [name] glucose monitoring kit (FREESTYLE) monitoring kit 1 each by Does not apply route as needed for other. Please give 100 lancets and testing strips.  Check sugars every morning before breakfast and every evening before dinner 1 each 0  . latanoprost (XALATAN) 0.005 % ophthalmic solution Place 1 drop into both eyes at bedtime.    [name] lisinopril (PRINIVIL,ZESTRIL) 2.5 MG tablet Take 1 tablet (2.5 mg total) by mouth  every morning. 90 tablet 3  . rivaroxaban (XARELTO) 20 MG TABS tablet Take 1 tablet (20 mg total) by mouth daily with supper. 20 tablet 0  . tamsulosin (FLOMAX) 0.4 MG CAPS capsule Take 1 capsule (0.4 mg total) by mouth daily. 30 capsule 0   No current facility-administered medications for this visit.    Past Medical History  Diagnosis Date  . Hypertension   . Dilated cardiomyopathy     2/12: EF 30-35%, trivial AI, mild RAE.  EF 2014 40 -45%  . CAD (coronary artery disease)     LHC 9/05 with Dr. Einar Gip:  dLM 20-30%, LAD 85%, oD1 20-30%.  PCI:  Taxus DES to LAD; Dx jailed and tx with POBA.  Last myoview 12/10: inf scar, no ischemia, EF 29%.  . DVT (deep venous thrombosis)   . Pulmonary embolus     chronic coumadin  . Hyperhomocystinemia   . PFO (patent foramen ovale)     Not mentioned on 2014 echo.  [name] HLD (hyperlipidemia)   . Crohn's disease   . Nephrolithiasis   . Diabetes mellitus     11/05/11 "borderline; don't take medications"  . Stroke 1993    "left arm can't hold steady; leg too"  . Arthritis     "used to have a touch in my legs"  . Memory difficulties   . Pneumonia ~ 2011    09-22-13 denies any recent SOB or breathing problems  . Swelling of both ankles  09-22-13 occ.feet, but denies pain.    Past Surgical History  Procedure Laterality Date  . Colon surgery  1994; 1996    "for Crohn's disease"  . Appendectomy      ROS:  As stated in the HPI and negative for all other systems.  PHYSICAL EXAM BP 100/60 mmHg  Pulse 51  Ht _0  (1.727 m)  Wt 132 lb 11.2 oz (60.192 kg)  BMI 20.18 kg/m2 GENERAL:  Well appearing,  Thin and somewhat frail but no distress NECK:  No jugular venous distention, waveform within normal limits, carotid upstroke brisk and symmetric, no bruits, no thyromegaly LUNGS:  Clear to auscultation bilaterally BACK:  No CVA tenderness CHEST:  Unremarkable HEART:  PMI not displaced or sustained,S1 and S2 within normal limits, no S3, no S4, no  clicks, no rubs, no murmurs ABD:  Flat, positive bowel sounds normal in frequency in pitch, no bruits, no rebound, no guarding, no midline pulsatile mass, no hepatomegaly, no splenomegaly EXT:  2 plus pulses throughout, mild ankle edema bilateral, no cyanosis no clubbing NEURO:  CN II - XII intact, difficulty with speech.     EKG:  Sinus rhythm, rate 53 premature atrial contractions, left into the hypertrophy by voltage, short PR interval,  With significant repolarization changes.   05/17/2014  ASSESSMENT AND PLAN  CHRONIC SYSTOLIC HEART FAILURE -  He seems to be euvolemic. He will continue medical management for his EF that is lower than previous.   His EF was 25% on his recent hospitalization which was about the same as previous.  At this point, no change in therapy is indicated.    CORONARY ARTERY DISEASE S/P LAD DES/PTCA 2005 -  The patient has no new sypmtoms.  No further cardiovascular testing is indicated.  We will continue with aggressive risk reduction and meds as listed.  ESSENTIAL HYPERTENSION, BENIGN -  The blood pressure is at target. No change in medications is indicated. We will continue with therapeutic lifestyle changes (TLC).  Pulmonary embolism -  He will remain now on Xarelto having failed warfarin.  NEPHROLITHIASIS- At this point he is not particularly symptomatic with his nephrolithiasis. He's had his recent stroke and I would not want him to come off of anticoagulation for an elective procedure. I discussed this with the patient and his wife. If however he has symptoms we could certainly discontinue this medication for 2 days electively for a urologic procedure.

## 2014-05-17 NOTE — Patient Instructions (Signed)
Your physician recommends that you schedule a follow-up appointment in: one month with Dr. Percival Spanish

## 2014-05-23 ENCOUNTER — Telehealth: Payer: Self-pay | Admitting: Cardiology

## 2014-05-23 NOTE — Telephone Encounter (Signed)
New Message        Pt's wife calling wanting to know why pt was scheduled for a f/u 1 week after last appt when they were told to f/u in 3 months. Please call back and advise.

## 2014-05-24 ENCOUNTER — Ambulatory Visit: Payer: Medicare Other | Admitting: Cardiology

## 2014-05-25 NOTE — Telephone Encounter (Signed)
Change this pt.s appt. To 3 months instead of what it is now and the pt. Wants it to be after  12:00 noon

## 2014-05-30 ENCOUNTER — Emergency Department (HOSPITAL_COMMUNITY): Payer: Medicare Other

## 2014-05-30 ENCOUNTER — Encounter (HOSPITAL_COMMUNITY): Payer: Self-pay | Admitting: *Deleted

## 2014-05-30 ENCOUNTER — Ambulatory Visit (HOSPITAL_COMMUNITY): Admission: AD | Admit: 2014-05-30 | Payer: Medicare Other

## 2014-05-30 ENCOUNTER — Inpatient Hospital Stay (HOSPITAL_COMMUNITY)
Admission: EM | Admit: 2014-05-30 | Discharge: 2014-06-04 | DRG: 227 | Disposition: A | Discharge status: Skilled Nursing Facility | Payer: Medicare Other | Attending: Internal Medicine | Admitting: Internal Medicine

## 2014-05-30 ENCOUNTER — Observation Stay (HOSPITAL_COMMUNITY): Payer: Medicare Other

## 2014-05-30 DIAGNOSIS — E119 Type 2 diabetes mellitus without complications: Secondary | ICD-10-CM

## 2014-05-30 DIAGNOSIS — Z86718 Personal history of other venous thrombosis and embolism: Secondary | ICD-10-CM

## 2014-05-30 DIAGNOSIS — R4701 Aphasia: Secondary | ICD-10-CM | POA: Diagnosis present

## 2014-05-30 DIAGNOSIS — E785 Hyperlipidemia, unspecified: Secondary | ICD-10-CM | POA: Diagnosis present

## 2014-05-30 DIAGNOSIS — I129 Hypertensive chronic kidney disease with stage 1 through stage 4 chronic kidney disease, or unspecified chronic kidney disease: Secondary | ICD-10-CM | POA: Diagnosis present

## 2014-05-30 DIAGNOSIS — F0390 Unspecified dementia without behavioral disturbance: Secondary | ICD-10-CM | POA: Diagnosis present

## 2014-05-30 DIAGNOSIS — I2510 Atherosclerotic heart disease of native coronary artery without angina pectoris: Secondary | ICD-10-CM | POA: Diagnosis present

## 2014-05-30 DIAGNOSIS — R55 Syncope and collapse: Secondary | ICD-10-CM | POA: Diagnosis present

## 2014-05-30 DIAGNOSIS — D649 Anemia, unspecified: Secondary | ICD-10-CM | POA: Diagnosis present

## 2014-05-30 DIAGNOSIS — I82812 Embolism and thrombosis of superficial veins of left lower extremities: Secondary | ICD-10-CM | POA: Diagnosis present

## 2014-05-30 DIAGNOSIS — R509 Fever, unspecified: Secondary | ICD-10-CM

## 2014-05-30 DIAGNOSIS — F17210 Nicotine dependence, cigarettes, uncomplicated: Secondary | ICD-10-CM | POA: Diagnosis present

## 2014-05-30 DIAGNOSIS — K5090 Crohn's disease, unspecified, without complications: Secondary | ICD-10-CM | POA: Diagnosis present

## 2014-05-30 DIAGNOSIS — N184 Chronic kidney disease, stage 4 (severe): Secondary | ICD-10-CM | POA: Diagnosis present

## 2014-05-30 DIAGNOSIS — Z833 Family history of diabetes mellitus: Secondary | ICD-10-CM

## 2014-05-30 DIAGNOSIS — Z7982 Long term (current) use of aspirin: Secondary | ICD-10-CM

## 2014-05-30 DIAGNOSIS — R739 Hyperglycemia, unspecified: Secondary | ICD-10-CM

## 2014-05-30 DIAGNOSIS — Z959 Presence of cardiac and vascular implant and graft, unspecified: Secondary | ICD-10-CM

## 2014-05-30 DIAGNOSIS — I255 Ischemic cardiomyopathy: Secondary | ICD-10-CM | POA: Diagnosis not present

## 2014-05-30 DIAGNOSIS — Z8673 Personal history of transient ischemic attack (TIA), and cerebral infarction without residual deficits: Secondary | ICD-10-CM

## 2014-05-30 DIAGNOSIS — E1165 Type 2 diabetes mellitus with hyperglycemia: Secondary | ICD-10-CM | POA: Diagnosis present

## 2014-05-30 DIAGNOSIS — E11649 Type 2 diabetes mellitus with hypoglycemia without coma: Secondary | ICD-10-CM | POA: Diagnosis not present

## 2014-05-30 DIAGNOSIS — Z955 Presence of coronary angioplasty implant and graft: Secondary | ICD-10-CM

## 2014-05-30 DIAGNOSIS — Z86711 Personal history of pulmonary embolism: Secondary | ICD-10-CM

## 2014-05-30 DIAGNOSIS — R52 Pain, unspecified: Secondary | ICD-10-CM

## 2014-05-30 DIAGNOSIS — E1122 Type 2 diabetes mellitus with diabetic chronic kidney disease: Secondary | ICD-10-CM

## 2014-05-30 DIAGNOSIS — Z8249 Family history of ischemic heart disease and other diseases of the circulatory system: Secondary | ICD-10-CM

## 2014-05-30 DIAGNOSIS — N289 Disorder of kidney and ureter, unspecified: Secondary | ICD-10-CM

## 2014-05-30 DIAGNOSIS — D638 Anemia in other chronic diseases classified elsewhere: Secondary | ICD-10-CM | POA: Diagnosis present

## 2014-05-30 DIAGNOSIS — R001 Bradycardia, unspecified: Secondary | ICD-10-CM | POA: Diagnosis present

## 2014-05-30 DIAGNOSIS — I639 Cerebral infarction, unspecified: Secondary | ICD-10-CM | POA: Diagnosis present

## 2014-05-30 DIAGNOSIS — I252 Old myocardial infarction: Secondary | ICD-10-CM

## 2014-05-30 DIAGNOSIS — F17200 Nicotine dependence, unspecified, uncomplicated: Secondary | ICD-10-CM

## 2014-05-30 DIAGNOSIS — I110 Hypertensive heart disease with heart failure: Secondary | ICD-10-CM | POA: Diagnosis present

## 2014-05-30 DIAGNOSIS — Z79899 Other long term (current) drug therapy: Secondary | ICD-10-CM

## 2014-05-30 DIAGNOSIS — Z8674 Personal history of sudden cardiac arrest: Secondary | ICD-10-CM

## 2014-05-30 DIAGNOSIS — Z7901 Long term (current) use of anticoagulants: Secondary | ICD-10-CM

## 2014-05-30 DIAGNOSIS — I5022 Chronic systolic (congestive) heart failure: Secondary | ICD-10-CM | POA: Diagnosis present

## 2014-05-30 HISTORY — DX: Bradycardia, unspecified: R00.1

## 2014-05-30 LAB — BASIC METABOLIC PANEL
ANION GAP: 5 (ref 5–15)
BUN: 12 mg/dL (ref 6–23)
CALCIUM: 8.7 mg/dL (ref 8.4–10.5)
CHLORIDE: 105 mmol/L (ref 96–112)
CO2: 27 mmol/L (ref 19–32)
CREATININE: 1.51 mg/dL — AB (ref 0.50–1.35)
GFR calc Af Amer: 50 mL/min — ABNORMAL LOW (ref >90)
GFR, EST NON AFRICAN AMERICAN: 43 mL/min — AB (ref >90)
Glucose, Bld: 255 mg/dL — ABNORMAL HIGH (ref 70–99)
POTASSIUM: 4.5 mmol/L (ref 3.5–5.1)
SODIUM: 137 mmol/L (ref 135–145)

## 2014-05-30 LAB — CBC WITH DIFFERENTIAL/PLATELET
BASOS ABS: 0 10*3/uL (ref 0.0–0.1)
BASOS PCT: 0 % (ref 0–1)
Eosinophils Absolute: 0.1 10*3/uL (ref 0.0–0.7)
Eosinophils Relative: 1 % (ref 0–5)
HCT: 35.1 % — ABNORMAL LOW (ref 39.0–52.0)
HEMOGLOBIN: 11.7 g/dL — AB (ref 13.0–17.0)
Lymphocytes Relative: 17 % (ref 12–46)
Lymphs Abs: 1 10*3/uL (ref 0.7–4.0)
MCH: 30.3 pg (ref 26.0–34.0)
MCHC: 33.3 g/dL (ref 30.0–36.0)
MCV: 90.9 fL (ref 78.0–100.0)
MONOS PCT: 7 % (ref 3–12)
Monocytes Absolute: 0.4 10*3/uL (ref 0.1–1.0)
Neutro Abs: 4.4 10*3/uL (ref 1.7–7.7)
Neutrophils Relative %: 75 % (ref 43–77)
Platelets: 163 10*3/uL (ref 150–400)
RBC: 3.86 MIL/uL — ABNORMAL LOW (ref 4.22–5.81)
RDW: 13.1 % (ref 11.5–15.5)
WBC: 5.9 10*3/uL (ref 4.0–10.5)

## 2014-05-30 LAB — URINE MICROSCOPIC-ADD ON

## 2014-05-30 LAB — URINALYSIS, ROUTINE W REFLEX MICROSCOPIC
GLUCOSE, UA: 100 mg/dL — AB
KETONES UR: 15 mg/dL — AB
Nitrite: NEGATIVE
PH: 5.5 (ref 5.0–8.0)
PROTEIN: 100 mg/dL — AB
Specific Gravity, Urine: 1.026 (ref 1.005–1.030)
Urobilinogen, UA: 1 mg/dL (ref 0.0–1.0)

## 2014-05-30 LAB — CBG MONITORING, ED: Glucose-Capillary: 222 mg/dL — ABNORMAL HIGH (ref 70–99)

## 2014-05-30 LAB — I-STAT TROPONIN, ED: Troponin i, poc: 0 ng/mL (ref 0.00–0.08)

## 2014-05-30 LAB — GLUCOSE, CAPILLARY: GLUCOSE-CAPILLARY: 112 mg/dL — AB (ref 70–99)

## 2014-05-30 MED ORDER — DONEPEZIL HCL 5 MG PO TABS
5.0000 mg | ORAL_TABLET | Freq: Every day | ORAL | Status: DC
  Administered 2014-05-30 – 2014-06-03 (×5): 5 mg via ORAL
  Filled 2014-05-30 (×5): qty 1

## 2014-05-30 MED ORDER — LATANOPROST 0.005 % OP SOLN
1.0000 [drp] | Freq: Every day | OPHTHALMIC | Status: DC
  Administered 2014-05-30 – 2014-06-03 (×5): 1 [drp] via OPHTHALMIC
  Filled 2014-05-30: qty 2.5

## 2014-05-30 MED ORDER — ATORVASTATIN CALCIUM 10 MG PO TABS
10.0000 mg | ORAL_TABLET | Freq: Every day | ORAL | Status: DC
  Administered 2014-05-31 – 2014-06-03 (×4): 10 mg via ORAL
  Filled 2014-05-30 (×4): qty 1

## 2014-05-30 MED ORDER — SODIUM CHLORIDE 0.9 % IJ SOLN
3.0000 mL | Freq: Two times a day (BID) | INTRAMUSCULAR | Status: DC
  Administered 2014-05-30 – 2014-06-04 (×8): 3 mL via INTRAVENOUS

## 2014-05-30 MED ORDER — INSULIN ASPART 100 UNIT/ML ~~LOC~~ SOLN
0.0000 [IU] | SUBCUTANEOUS | Status: DC
  Administered 2014-05-31: 1 [IU] via SUBCUTANEOUS
  Administered 2014-06-01 – 2014-06-02 (×2): 2 [IU] via SUBCUTANEOUS
  Administered 2014-06-03: 1 [IU] via SUBCUTANEOUS
  Administered 2014-06-03: 2 [IU] via SUBCUTANEOUS
  Administered 2014-06-04: 1 [IU] via SUBCUTANEOUS

## 2014-05-30 MED ORDER — SODIUM CHLORIDE 0.9 % IV SOLN
INTRAVENOUS | Status: DC
  Administered 2014-05-30: 23:00:00 via INTRAVENOUS

## 2014-05-30 MED ORDER — ASPIRIN EC 81 MG PO TBEC
81.0000 mg | DELAYED_RELEASE_TABLET | Freq: Every day | ORAL | Status: DC
  Administered 2014-05-31 – 2014-06-04 (×4): 81 mg via ORAL
  Filled 2014-05-30 (×4): qty 1

## 2014-05-30 MED ORDER — CARVEDILOL 3.125 MG PO TABS
3.1250 mg | ORAL_TABLET | Freq: Every day | ORAL | Status: DC
  Filled 2014-05-30: qty 1

## 2014-05-30 MED ORDER — RIVAROXABAN 20 MG PO TABS
20.0000 mg | ORAL_TABLET | Freq: Every day | ORAL | Status: DC
  Administered 2014-05-31: 20 mg via ORAL
  Filled 2014-05-30: qty 1

## 2014-05-30 NOTE — H&P (Signed)
Hospitalist Admission History and Physical  Patient name: Grant Lara Medical record number: [id number] Date of birth: [birth year] Age: 74 y.o. Gender: male  Primary Care Provider: Lynne Logan, MD  Chief Complaint: syncope   History of Present Illness:This is a 74 y.o. 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 Per report, pt w/ witnessed syncopal episode while eating today. Pt was found unconscious by his wife. Pt noted to have been recently admitted 08/5372 for embolic stroke across both hemispheres of the brain w/ lingering bilateral weakness and dysphagia. Pt denies any prodrome prior to sxs. No CP, SOB, nausea, vomiting, abd pain. Pt unaware how long he was unconscious for. Denies any LOC.  Presented to the ER T 95.6-improved w/ blankets to 97.8, HT 40s-70s, resp 10s, BP 100s. CBC and CMET stable from last admission. Cr 1.5. hgb 11.7. Glu 255. Trop neg x1. CXR w/ no acute changes. Head CT w/ no acute intracranial hemorrhage-with noted multiple chronic infarcts. + LE/calf pain.   Assessment and Plan: Grant Lara is a 74 y.o. year old male presenting with syncope   Active Problems:   Syncope   1- Syncope  -high risk for neurocardiogenic etiology -recent admission for bihemispheric embolic stroke, baseline systolic CHF  -CXR WNL  -UA mildly indicative of ? 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 Syncope 05/30/2014  . Stroke   . CVA (cerebral infarction)   . Hyperlipidemia   . Coronary artery disease involving native coronary artery of native heart without angina pectoris   . LVH (left ventricular hypertrophy)   . NSVT (nonsustained ventricular tachycardia) 04/16/2014  . CKD (chronic kidney disease) stage 3, GFR 30-59 ml/min 04/16/2014  . Aphasia 04/15/2014  . Abnormal EKG   . Anemia, unspecified 11/21/2013  . DM2 (diabetes mellitus, type 2) 11/20/2013  . Acute pulmonary embolism with pulmonary infarction. 11/20/2013  . Leukocytosis 11/20/2013  . Hyperglycemia 08/22/2013  . Dehydration 08/21/2013  . ARF (acute renal failure) 08/21/2013  . Abnormal ECG 08/21/2013  . Delirium 07/18/2013  . Small bowel obstruction 07/13/2013  . Encounter for therapeutic drug monitoring 05/11/2013  . Crohn's disease 02/16/2012  . Abnormal CT scan of lung 11/08/2011  . Bilateral leg and foot pain 11/06/2011  . SBO (small bowel obstruction) 11/05/2011  . HLD (hyperlipidemia) 03/28/2011  . Pulmonary embolism 06/05/2010  . Acute embolic stroke 76/67/5449  . DVT (  deep venous thrombosis) 06/05/2010  . EMPHYSEMATOUS BLEB 04/06/2010  . C O P D 04/05/2010  . Chronic systolic heart failure 07/04/2246  . Memory loss 04/03/2009  . CHEST PAIN 03/08/2009  . Essential hypertension, benign 02/10/2009  . TOBACCO ABUSE 12/12/2008  . CORONARY ARTERY DISEASE S/P LAD  DES/PTCA 2005 12/12/2008  . CARDIOMYOPATHY 12/12/2008  . ABNORMAL ELECTROCARDIOGRAM 12/12/2008   Past Medical History: Past Medical History  Diagnosis Date  . Hypertension   . Dilated cardiomyopathy     2/12: EF 30-35%, trivial AI, mild RAE.  EF 2014 40 -45%  . CAD (coronary artery disease)     LHC 9/05 with Dr. Einar Gip:  dLM 20-30%, LAD 85%, oD1 20-30%.  PCI:  Taxus DES to LAD; Dx jailed and tx with POBA.  Last myoview 12/10: inf scar, no ischemia, EF 29%.  . DVT (deep venous thrombosis)   . Pulmonary embolus     chronic coumadin  . Hyperhomocystinemia   . PFO (patent foramen ovale)     Not mentioned on 2014 echo.  [name] HLD (hyperlipidemia)   . Crohn's disease   . Nephrolithiasis   . Diabetes mellitus     11/05/11 "borderline; don't take medications"  . Stroke 1993    "left arm can't hold steady; leg too"  . Arthritis     "used to have a touch in my legs"  . Memory difficulties   . Pneumonia ~ 2011    09-22-13 denies any recent SOB or breathing problems  . Swelling of both ankles      09-22-13 occ.feet, but denies pain.    Past Surgical History: Past Surgical History  Procedure Laterality Date  . Colon surgery  1994; 1996    "for Crohn's disease"  . Appendectomy      Social History: History   Social History  . Marital Status: Married    Spouse Name: N/A  . Number of Children: N/A  . Years of Education: N/A   Social History Main Topics  . Smoking status: Current Every Day Smoker -- 0.50 packs/day for 53 years    Types: Cigarettes  . Smokeless tobacco: Never Used  . Alcohol Use: No  . Drug Use: No  . Sexual Activity: Not Currently   Other Topics Concern  . None   Social History Narrative    Family History: Family History  Problem Relation Age of Onset  . Diabetes type II Mother   . CAD Father   . Diabetes type II Brother     Allergies: No Known Allergies  Current Facility-Administered Medications  Medication Dose Route Frequency Provider Last Rate  Last Dose  . 0.9 %  sodium chloride infusion   Intravenous Continuous Shanda Howells, MD      . aspirin EC tablet 81 mg  81 mg Oral Daily Shanda Howells, MD      . Derrill Memo ON 05/31/2014] atorvastatin (LIPITOR) tablet 10 mg  10 mg Oral q1800 Shanda Howells, MD      . carvedilol (COREG) tablet 3.125 mg  3.125 mg Oral Daily Shanda Howells, MD      . donepezil (ARICEPT) tablet 5 mg  5 mg Oral QHS Shanda Howells, MD      . insulin aspart (novoLOG) injection 0-9 Units  0-9 Units Subcutaneous 6 times per day Shanda Howells, MD      . latanoprost (XALATAN) 0.005 % ophthalmic solution 1 drop  1 drop Both Eyes QHS Shanda Howells, MD      . Derrill Memo ON 05/31/2014]  rivaroxaban (XARELTO) tablet 20 mg  20 mg Oral Q supper Shanda Howells, MD      . sodium chloride 0.9 % injection 3 mL  3 mL Intravenous Q12H Shanda Howells, MD       Current Outpatient Prescriptions  Medication Sig Dispense Refill  . acetaminophen (TYLENOL) 325 MG tablet Take 2 tablets (650 mg total) by mouth every 6 (six) hours as needed for mild pain, moderate pain or fever.    [name] aspirin EC 81 MG EC tablet Take 1 tablet (81 mg total) by mouth daily. 30 tablet 0  . atorvastatin (LIPITOR) 10 MG tablet Take 1 tablet (10 mg total) by mouth daily at 6 PM. 30 tablet 0  . carvedilol (COREG) 3.125 MG tablet Take 1 tablet (3.125 mg total) by mouth daily. 90 tablet 3  . donepezil (ARICEPT) 5 MG tablet Take 5 mg by mouth at bedtime.    [name] glucose monitoring kit (FREESTYLE) monitoring kit 1 each by Does not apply route as needed for other. Please give 100 lancets and testing strips.  Check sugars every morning before breakfast and every evening before dinner 1 each 0  . latanoprost (XALATAN) 0.005 % ophthalmic solution Place 1 drop into both eyes at bedtime.    [name] lisinopril (PRINIVIL,ZESTRIL) 2.5 MG tablet Take 1 tablet (2.5 mg total) by mouth every morning. 90 tablet 3  . rivaroxaban (XARELTO) 20 MG TABS tablet Take 1 tablet (20 mg total) by mouth daily with supper.  20 tablet 0  . tamsulosin (FLOMAX) 0.4 MG CAPS capsule Take 1 capsule (0.4 mg total) by mouth daily. 30 capsule 0   Review Of Systems: 12 point ROS negative except as noted above in HPI.  Physical Exam: Filed Vitals:   05/30/14 2000  BP: 109/62  Pulse: 53  Temp:   Resp: 16    General: cooperative and baseline dysphagia  HEENT: PERRLA and extra ocular movement intact Heart: S1, S2 normal, no murmur, rub or gallop, regular rate and rhythm Lungs: clear to auscultation, no wheezes or rales and unlabored breathing Abdomen: abdomen is soft without significant tenderness, masses, organomegaly or guarding Extremities: generalized weakness diffusely  Skin:no rashes Neurology: generalized weakness diffusely, dysarthria, otherwise grossly normal   Labs and Imaging: Lab Results  Component Value Date/Time   NA 137 05/30/2014 05:39 PM   K 4.5 05/30/2014 05:39 PM   CL 105 05/30/2014 05:39 PM   CO2 27 05/30/2014 05:39 PM   BUN 12 05/30/2014 05:39 PM   CREATININE 1.51* 05/30/2014 05:39 PM   CREATININE 1.34 12/13/2011 05:22 PM   GLUCOSE 255* 05/30/2014 05:39 PM   Lab Results  Component Value Date   WBC 5.9 05/30/2014   HGB 11.7* 05/30/2014   HCT 35.1* 05/30/2014   MCV 90.9 05/30/2014   PLT 163 05/30/2014   Urinalysis    Component Value Date/Time   COLORURINE RED* 05/30/2014 1833   APPEARANCEUR CLOUDY* 05/30/2014 1833   LABSPEC 1.026 05/30/2014 1833   PHURINE 5.5 05/30/2014 1833   GLUCOSEU 100* 05/30/2014 1833   HGBUR LARGE* 05/30/2014 1833   BILIRUBINUR MODERATE* 05/30/2014 1833   KETONESUR 15* 05/30/2014 1833   PROTEINUR 100* 05/30/2014 1833   UROBILINOGEN 1.0 05/30/2014 1833   NITRITE NEGATIVE 05/30/2014 1833   LEUKOCYTESUR SMALL* 05/30/2014 1833       Ct Head Wo Contrast  05/30/2014   CLINICAL DATA:  Weakness  EXAM: CT HEAD WITHOUT CONTRAST  TECHNIQUE: Contiguous axial images were obtained from the base of the skull through the  vertex without intravenous contrast.   COMPARISON:  CT 04/15/2014 and MRI same day  FINDINGS: No skull fracture is noted. Paranasal sinuses and mastoid air cells are unremarkable. Atherosclerotic calcifications of carotid siphon again noted.  Stable cerebral atrophy. No intracranial hemorrhage, mass effect or midline shift. Again noted remote left cerebellar infarct. Stable chronic lacunar infarct in right basal ganglia. Stable chronic infarct in right occipital lobe. There are better visualized chronic appearing infarcts now in left parieto temporal region see axial image 22. Also chronic appearing infarcts in left frontal lobe see axial image 21. No definite acute cortical infarction. Clinical correlation is necessary. No mass lesion is noted on this unenhanced scan.  IMPRESSION: No intracranial hemorrhage, mass effect or midline shift. Again noted multiple chronic infarcts as described above. Normal evolution with chronic appearance of recent infarcts in left frontal lobe and left frontoparietal lobe posteriorly. No definite acute cortical infarction. If there is high clinical suspicious for recurrent infarct further correlation with MRI with diffusion imaging is recommended.   Electronically Signed   By: Lahoma Crocker M.D.   On: 05/30/2014 19:57   Dg Chest Portable 1 View  05/30/2014   CLINICAL DATA:  Chest pain and shortness of breath today  EXAM: PORTABLE CHEST - 1 VIEW  COMPARISON:  April 15, 2014, CT chest April 18, 2014  FINDINGS: The heart size and mediastinal contours are within normal limits. There is no focal pneumonia, pulmonary edema, or pleural effusion. There is a question 1.9 x 1.1 cm nodule in the right mid to lower lung. This is not definitely appreciated on prior chest x-rays or chest CT. The visualized skeletal structures are stable.  IMPRESSION: No active cardiac disease.  Question 1.9 cm nodule in the right mid to lower lung. This is not definitely appreciated on the prior chest x-ray or chest CT. Recommend further evaluation  with a chest CT on outpatient basis.   Electronically Signed   By: Abelardo Diesel M.D.   On: 05/30/2014 18:20           Shanda Howells MD  Pager: [phone]

## 2014-05-30 NOTE — ED Provider Notes (Signed)
CSN: [id number]     Arrival date & time 05/30/14  1711 History   First MD Initiated Contact with Patient 05/30/14 1721     Chief Complaint  Patient presents with  . Weakness  LEVEL 5 CAVEAT DUE TO DEMENTIA  Patient is a 74 y.o. male presenting with weakness. The history is provided by the EMS personnel. The history is limited by the condition of the patient.  Weakness This is a new problem. Episode onset: UNKNOWN TIME AGO. The problem occurs constantly. The problem has been gradually improving. Pertinent negatives include no chest pain and no headaches. Nothing aggravates the symptoms. Nothing relieves the symptoms.  Patient presents from home for possible syncopal episode Per EMS, pt was found by wife slumped over at the table EMS reports he was pale/weak appearing on arrival He is now improving Pt can not recall what happened He denies CP but he reports mild back pain No other details are known at arrival due to h/o dementia  Past Medical History  Diagnosis Date  . Hypertension   . Dilated cardiomyopathy     2/12: EF 30-35%, trivial AI, mild RAE.  EF 2014 40 -45%  . CAD (coronary artery disease)     LHC 9/05 with Dr. Einar Gip:  dLM 20-30%, LAD 85%, oD1 20-30%.  PCI:  Taxus DES to LAD; Dx jailed and tx with POBA.  Last myoview 12/10: inf scar, no ischemia, EF 29%.  . DVT (deep venous thrombosis)   . Pulmonary embolus     chronic coumadin  . Hyperhomocystinemia   . PFO (patent foramen ovale)     Not mentioned on 2014 echo.  [name] HLD (hyperlipidemia)   . Crohn's disease   . Nephrolithiasis   . Diabetes mellitus     11/05/11 "borderline; don't take medications"  . Stroke 1993    "left arm can't hold steady; leg too"  . Arthritis     "used to have a touch in my legs"  . Memory difficulties   . Pneumonia ~ 2011    09-22-13 denies any recent SOB or breathing problems  . Swelling of both ankles      09-22-13 occ.feet, but denies pain.   Past Surgical History  Procedure Laterality Date   . Colon surgery  1994; 1996    "for Crohn's disease"  . Appendectomy     Family History  Problem Relation Age of Onset  . Diabetes type II Mother   . CAD Father   . Diabetes type II Brother    History  Substance Use Topics  . Smoking status: Current Every Day Smoker -- 0.50 packs/day for 53 years    Types: Cigarettes  . Smokeless tobacco: Never Used  . Alcohol Use: No    Review of Systems  Unable to perform ROS: Dementia  Cardiovascular: Negative for chest pain.  Neurological: Positive for weakness. Negative for headaches.      Allergies  Review of patient's allergies indicates no known allergies.  Home Medications   Prior to Admission medications   Medication Sig Start Date End Date Taking? Authorizing Provider  acetaminophen (TYLENOL) 325 MG tablet Take 2 tablets (650 mg total) by mouth every 6 (six) hours as needed for mild pain, moderate pain or fever. 11/23/13   Modena Jansky, MD  aspirin EC 81 MG EC tablet Take 1 tablet (81 mg total) by mouth daily. 04/18/14   Maryann Mikhail, DO  atorvastatin (LIPITOR) 10 MG tablet Take 1 tablet (10 mg total) by mouth  daily at 6 PM. 04/18/14   Maryann Mikhail, DO  carvedilol (COREG) 3.125 MG tablet Take 1 tablet (3.125 mg total) by mouth daily. 02/07/14   Minus Breeding, MD  donepezil (ARICEPT) 5 MG tablet Take 5 mg by mouth at bedtime.    Historical Provider, MD  glucose monitoring kit (FREESTYLE) monitoring kit 1 each by Does not apply route as needed for other. Please give 100 lancets and testing strips.  Check sugars every morning before breakfast and every evening before dinner 08/23/13   Debbe Odea, MD  latanoprost (XALATAN) 0.005 % ophthalmic solution Place 1 drop into both eyes at bedtime. 06/29/13   Historical Provider, MD  lisinopril (PRINIVIL,ZESTRIL) 2.5 MG tablet Take 1 tablet (2.5 mg total) by mouth every morning. 02/07/14   Minus Breeding, MD  rivaroxaban (XARELTO) 20 MG TABS tablet Take 1 tablet (20 mg total) by mouth  daily with supper. 05/17/14   Minus Breeding, MD  tamsulosin (FLOMAX) 0.4 MG CAPS capsule Take 1 capsule (0.4 mg total) by mouth daily. 04/18/14   Maryann Mikhail, DO   SpO2 98% Physical Exam CONSTITUTIONAL: elderly, frail HEAD: Normocephalic/atraumatic EYES: EOMI ENMT: Mucous membranes moist NECK: supple no meningeal signs SPINE/BACK:entire spine nontender, No bruising/crepitance/stepoffs noted to spine CV: S1/S2 noted, bradycardic LUNGS: Lungs are clear to auscultation bilaterally ABDOMEN: soft, nontender GU:no cva tenderness NEURO: Pt is awake/alert, no arm/leg drift.  He has difficulty speaking, appears to have expressive aphasia. He has mild tremor noted EXTREMITIES:  full ROM SKIN: warm, color normal PSYCH: unable to assess  ED Course  Procedures  5:43 PM Pt seen on arrival  EKG and prehospital EKG concerning for STEMI D/w dr cooper with cardiology he has reviewed EKG at 1734 and EKG similar to prior and no changes, no STEMI Pt poor historian as has dementia at baseline and prior stroke (h/o aphasia) He does have h/o PE (on xarelto) as well as non-sustained V-tach Will follow closely 7:36 PM Wife at bedside She confirms he had LOC while at table and he may have hit head on table but no other injury Will order CT head as he is on xarelto No SZ reported 8:43 PM CT head negative Pt awake/alert, no distress, currently stable D/w dr Ernestina Patches will evaluate for admission   Labs Review Labs Reviewed  CBC WITH DIFFERENTIAL/PLATELET - Abnormal; Notable for the following:    RBC 3.86 (*)    Hemoglobin 11.7 (*)    HCT 35.1 (*)    All other components within normal limits  BASIC METABOLIC PANEL - Abnormal; Notable for the following:    Glucose, Bld 255 (*)    Creatinine, Ser 1.51 (*)    GFR calc non Af Amer 43 (*)    GFR calc Af Amer 50 (*)    All other components within normal limits  URINALYSIS, ROUTINE W REFLEX MICROSCOPIC - Abnormal; Notable for the following:    Color,  Urine RED (*)    APPearance CLOUDY (*)    Glucose, UA 100 (*)    Hgb urine dipstick LARGE (*)    Bilirubin Urine MODERATE (*)    Ketones, ur 15 (*)    Protein, ur 100 (*)    Leukocytes, UA SMALL (*)    All other components within normal limits  URINE MICROSCOPIC-ADD ON - Abnormal; Notable for the following:    Bacteria, UA FEW (*)    Casts HYALINE CASTS (*)    All other components within normal limits  CBG MONITORING, ED - Abnormal;  Notable for the following:    Glucose-Capillary 222 (*)    All other components within normal limits  I-STAT TROPOININ, ED    Imaging Review Ct Head Wo Contrast  05/30/2014   CLINICAL DATA:  Weakness  EXAM: CT HEAD WITHOUT CONTRAST  TECHNIQUE: Contiguous axial images were obtained from the base of the skull through the vertex without intravenous contrast.  COMPARISON:  CT 04/15/2014 and MRI same day  FINDINGS: No skull fracture is noted. Paranasal sinuses and mastoid air cells are unremarkable. Atherosclerotic calcifications of carotid siphon again noted.  Stable cerebral atrophy. No intracranial hemorrhage, mass effect or midline shift. Again noted remote left cerebellar infarct. Stable chronic lacunar infarct in right basal ganglia. Stable chronic infarct in right occipital lobe. There are better visualized chronic appearing infarcts now in left parieto temporal region see axial image 22. Also chronic appearing infarcts in left frontal lobe see axial image 21. No definite acute cortical infarction. Clinical correlation is necessary. No mass lesion is noted on this unenhanced scan.  IMPRESSION: No intracranial hemorrhage, mass effect or midline shift. Again noted multiple chronic infarcts as described above. Normal evolution with chronic appearance of recent infarcts in left frontal lobe and left frontoparietal lobe posteriorly. No definite acute cortical infarction. If there is high clinical suspicious for recurrent infarct further correlation with MRI with  diffusion imaging is recommended.   Electronically Signed   By: Lahoma Crocker M.D.   On: 05/30/2014 19:57   Dg Chest Portable 1 View  05/30/2014   CLINICAL DATA:  Chest pain and shortness of breath today  EXAM: PORTABLE CHEST - 1 VIEW  COMPARISON:  April 15, 2014, CT chest April 18, 2014  FINDINGS: The heart size and mediastinal contours are within normal limits. There is no focal pneumonia, pulmonary edema, or pleural effusion. There is a question 1.9 x 1.1 cm nodule in the right mid to lower lung. This is not definitely appreciated on prior chest x-rays or chest CT. The visualized skeletal structures are stable.  IMPRESSION: No active cardiac disease.  Question 1.9 cm nodule in the right mid to lower lung. This is not definitely appreciated on the prior chest x-ray or chest CT. Recommend further evaluation with a chest CT on outpatient basis.   Electronically Signed   By: Abelardo Diesel M.D.   On: 05/30/2014 18:20     EKG Interpretation   Date/Time:  Monday May 30 2014 17:22:37 EST Ventricular Rate:  48 PR Interval:  117 QRS Duration: 125 QT Interval:  478 QTC Calculation: 427 R Axis:   50 Text Interpretation:  Sinus bradycardia Atrial premature complex  Borderline short PR interval LVH with secondary repolarization abnormality  Anterior Q waves, possibly due to LVH Artifact in lead(s) I III aVR aVL  aVF V1 V2 V5 V6 and baseline wander in lead(s) II aVF Confirmed by  Christy Gentles  MD, Elenore Rota ([id number]) on 05/30/2014 5:34:49 PM      MDM   Final diagnoses:  Syncope, unspecified syncope type  Renal insufficiency  Hyperglycemia    Nursing notes including past medical history and social history reviewed and considered in documentation Previous records reviewed and considered xrays/imaging reviewed by myself and considered during evaluation Labs/vital reviewed myself and considered during evaluation     Sharyon Cable, MD 05/30/14 2044

## 2014-05-30 NOTE — ED Notes (Signed)
Pt up to bedside toliet

## 2014-05-30 NOTE — ED Notes (Addendum)
Pt arrives from home via [facility]. Pt was found by wife slumped over at the table. Pt states he was feeling fine and then he just had people all around him. Pt has had some c/o back pain without radiation . Pt states he believes he had LOC.

## 2014-05-31 ENCOUNTER — Observation Stay (HOSPITAL_COMMUNITY): Payer: Medicare Other

## 2014-05-31 DIAGNOSIS — Z86718 Personal history of other venous thrombosis and embolism: Secondary | ICD-10-CM

## 2014-05-31 DIAGNOSIS — R55 Syncope and collapse: Secondary | ICD-10-CM

## 2014-05-31 LAB — GLUCOSE, CAPILLARY
Glucose-Capillary: 105 mg/dL — ABNORMAL HIGH (ref 70–99)
Glucose-Capillary: 108 mg/dL — ABNORMAL HIGH (ref 70–99)
Glucose-Capillary: 115 mg/dL — ABNORMAL HIGH (ref 70–99)
Glucose-Capillary: 120 mg/dL — ABNORMAL HIGH (ref 70–99)
Glucose-Capillary: 127 mg/dL — ABNORMAL HIGH (ref 70–99)
Glucose-Capillary: 81 mg/dL (ref 70–99)

## 2014-05-31 MED ORDER — CARVEDILOL 3.125 MG PO TABS
3.1250 mg | ORAL_TABLET | Freq: Two times a day (BID) | ORAL | Status: DC
  Administered 2014-06-01 – 2014-06-04 (×7): 3.125 mg via ORAL
  Filled 2014-05-31 (×7): qty 1

## 2014-05-31 MED ORDER — ACETAMINOPHEN 325 MG PO TABS
650.0000 mg | ORAL_TABLET | Freq: Four times a day (QID) | ORAL | Status: DC | PRN
  Administered 2014-05-31 – 2014-06-01 (×3): 650 mg via ORAL
  Filled 2014-05-31 (×3): qty 2

## 2014-05-31 MED ORDER — LISINOPRIL 2.5 MG PO TABS
2.5000 mg | ORAL_TABLET | Freq: Every day | ORAL | Status: DC
  Administered 2014-06-01 – 2014-06-04 (×3): 2.5 mg via ORAL
  Filled 2014-05-31 (×3): qty 1

## 2014-05-31 NOTE — Progress Notes (Signed)
VASCULAR LAB PRELIMINARY  PRELIMINARY  PRELIMINARY  PRELIMINARY  BLEV duplex completed.   Positive partially occluding chronic DVT bilaterally with acute thrombus superficial LT Greater Saphenous Vein.   Lindwood Coke, RVT 05/31/2014, 11:44 AM

## 2014-05-31 NOTE — Progress Notes (Signed)
Held pt am coreg per Dr. Sheran Fava. Pt bradycardic in the 40's. Will continue to monitor.

## 2014-05-31 NOTE — Progress Notes (Signed)
UR completed 

## 2014-05-31 NOTE — Progress Notes (Signed)
Echocardiogram 2D Echocardiogram has been performed.  Grant Lara 05/31/2014, 12:21 PM

## 2014-05-31 NOTE — Progress Notes (Addendum)
[facility] PROGRESS NOTE  Grant Lara HMC:[id number] DOB: 1940-08-12 DOA: 05/30/2014 PCP: Lynne Logan, MD  Brief Summary  This is a 74 y.o. 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 He had a witnessed syncopal episode while eating today. Pt was found unconscious by his wife. Pt noted to have been recently admitted 09/6292 for embolic stroke across both hemispheres of the brain w/ lingering bilateral weakness and dysphagia. Pt denies any prodrome prior to sxs. No CP, SOB, nausea, vomiting, abd pain. Pt unaware how long he was unconscious for. Denies any LOC.  Presented to the ER T 95.6-improved w/ blankets to 97.8, HT 40s-70s, resp 10s, BP 100s. CBC and CMET stable from last admission. Cr 1.5. hgb 11.7. Glu 255. Trop neg x1. CXR w/ no acute changes. Head CT w/ no acute intracranial hemorrhage-with noted multiple chronic infarcts. + LE/calf pain.   Assessment/Plan  Syncope  -high risk for neurocardiogenic etiology -recent admission for bihemispheric embolic stroke, baseline systolic CHF  -CXR WNL  -UA mildly indicative of ? Infection-cx-follow -MRI:  No acute stroke (subacute from last month) -  2D ECHO:  EF 20-25% with diffuse hypokinesis, no new valvular abnl -no reported seizure activity -neurovascular checks  -  D/c IVF -  Continue telemetry and consider cards for HOLTER. -  Orthostatics  2- Embolic stroke -recent admission  -noted VTE failure on coumadin -no new findings on CT or MRI -cont xarelto   3-Chronic systolic CHF -EF 76-54% on 2D ECHO 04/2014 -repeat 2D ECHO:  Approximately stable from prior -noted hx/o nonstustained Vtach -no occurrence on admission -tele:  No significant arrhythmias, just some bradycardia.  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  -baseline  6-PE -+ occurrence on coumadin  -currently on xarelto  -no SOB, CP, tachycardia -noted + popliteal pain bilaterally  -check LE u/s:  Chronic DVT and acute superficial thrombus left leg -cont xarelto   Bilateral foot pain -  XR left foot (worse than right) -  ABI -  PT/OT assessment  Dark urine -  Check CPK   FEN/GI: heart healthy, carb modified diet  Prophylaxis: xarelto  Disposition: pending further evaluation  Code Status:Full Code   Consultants:  NOne  Procedures:  CT head  MRI brain  Duplex lower extremity   echo  Antibiotics:  none   HPI/Subjective:  Feet hurt, otherwise he feels well.  No focal numbness, weakness.  No recollection of yesterday.  + dysuria.     Objective: Filed Vitals:   05/31/14 0456 05/31/14 0801 05/31/14 1148 05/31/14 1621  BP:      Pulse:      Temp: 98.4 F (36.9 C) 98.1 F (36.7 C) 98 F (36.7 C) 98.6 F (37 C)  TempSrc:  Oral Oral Oral  Resp:      Height:      Weight: 58.968 kg (130 lb)     SpO2:        Intake/Output Summary (Last 24 hours) at 05/31/14 1651 Last data filed at 05/31/14 1245  Gross per 24 hour  Intake 466.67 ml  Output      0  ml  Net 466.67 ml   Filed Weights   05/30/14 2258 05/31/14 0456  Weight: 59.013 kg (130 lb 1.6 oz) 58.968 kg (130 lb)    Exam:   General:  Thin M, No acute distress, slow stuttered speech  HEENT:  NCAT, MMM  Cardiovascular:  RRR, nl S1, S2, + gallop,, warm extremities  Respiratory:  CTAB, no increased WOB  Abdomen:   NABS, soft, NT/ND  MSK:   Normal tone and bulk, no LEE, enlarged joints, feet mildly TTP and slightly warm   Neuro:  5/5 symmetric strenght and sensation, no facial droop  Data Reviewed: Basic Metabolic Panel:  Recent Labs Lab  05/30/14 1739  NA 137  K 4.5  CL 105  CO2 27  GLUCOSE 255*  BUN 12  CREATININE 1.51*  CALCIUM 8.7   Liver Function Tests: No results for input(s): AST, ALT, ALKPHOS, BILITOT, PROT, ALBUMIN in the last 168 hours. No results for input(s): LIPASE, AMYLASE in the last 168 hours. No results for input(s): AMMONIA in the last 168 hours. CBC:  Recent Labs Lab 05/30/14 1739  WBC 5.9  NEUTROABS 4.4  HGB 11.7*  HCT 35.1*  MCV 90.9  PLT 163   Cardiac Enzymes: No results for input(s): CKTOTAL, CKMB, CKMBINDEX, TROPONINI in the last 168 hours. BNP (last 3 results) No results for input(s): BNP in the last 8760 hours.  ProBNP (last 3 results) No results for input(s): PROBNP in the last 8760 hours.  CBG:  Recent Labs Lab 05/30/14 2349 05/31/14 0452 05/31/14 0720 05/31/14 1136 05/31/14 1620  GLUCAP 112* 127* 81 108* 115*    No results found for this or any previous visit (from the past 240 hour(s)).   Studies: Ct Head Wo Contrast  05/30/2014   CLINICAL DATA:  Weakness  EXAM: CT HEAD WITHOUT CONTRAST  TECHNIQUE: Contiguous axial images were obtained from the base of the skull through the vertex without intravenous contrast.  COMPARISON:  CT 04/15/2014 and MRI same day  FINDINGS: No skull fracture is noted. Paranasal sinuses and mastoid air cells are unremarkable. Atherosclerotic calcifications of carotid siphon again noted.  Stable cerebral atrophy. No intracranial hemorrhage, mass effect or midline shift. Again noted remote left cerebellar infarct. Stable chronic lacunar infarct in right basal ganglia. Stable chronic infarct in right occipital lobe. There are better visualized chronic appearing infarcts now in left parieto temporal region see axial image 22. Also chronic appearing infarcts in left frontal lobe see axial image 21. No definite acute cortical infarction. Clinical correlation is necessary. No mass lesion is noted on this unenhanced scan.  IMPRESSION: No intracranial  hemorrhage, mass effect or midline shift. Again noted multiple chronic infarcts as described above. Normal evolution with chronic appearance of recent infarcts in left frontal lobe and left frontoparietal lobe posteriorly. No definite acute cortical infarction. If there is high clinical suspicious for recurrent infarct further correlation with MRI with diffusion imaging is recommended.   Electronically Signed   By: Lahoma Crocker M.D.   On: 05/30/2014 19:57   Mri Brain Without Contrast  05/30/2014   CLINICAL DATA:  Weakness, possible syncopal episode, found by wife slumped over table, now improving. Dementia. History of hyperlipidemia, diabetes, stroke.  EXAM: MRI HEAD WITHOUT CONTRAST  TECHNIQUE: Multiplanar, multiecho pulse sequences of the brain and surrounding structures were obtained without intravenous contrast.  COMPARISON:  CT of the head May 30, 2014 at 1950 hours and MRI of the brain April 15, 2014  FINDINGS: No reduced diffusion to suggest  acute ischemia. Minimal residual reduced diffusion within LEFT parietal and frontal lobes, present on prior imaging, normalized ADC values. The additional tiny acute infarcts from prior examination are no longer apparent by diffusion imaging. No susceptibility artifact to suggest hemorrhage.  Moderate ventriculomegaly, on the basis of global parenchymal brain volume loss, with somewhat disproportionate frontoparietal sulcal enlargement, similar to prior examination. Bilateral frontal, bilateral parietal and bilateral occipital lobe infarcts present on prior examination. Multiple LEFT greater than RIGHT cerebellar remote infarcts. Scattered additional white matter T2 hyperintensities, exclusive of the gliosis again noted likely representing chronic small vessel ischemic disease. Remote RIGHT thalamus and bilateral basal ganglia small infarcts.  No midline shift, mass effect or mass lesions. No abnormal extra-axial fluid collections. Normal major intracranial  vascular flow voids seen at the skull base.  Mild paranasal sinus mucosal thickening. Mastoid air cells appear well-aerated. No abnormal sellar expansion. No cerebellar tonsillar ectopia. Patient appears edentulous.  IMPRESSION: No acute intracranial process.  Subacute to early chronic LEFT frontoparietal infarcts. Multifocal additional remote infarcts spanning multiple vascular territories suggests embolic disease. Basal ganglia and thalamus remote lacunar infarcts.  Moderate to severe global parenchymal brain volume loss.   Electronically Signed   By: Elon Alas   On: 05/30/2014 22:23   Dg Chest Portable 1 View  05/30/2014   CLINICAL DATA:  Chest pain and shortness of breath today  EXAM: PORTABLE CHEST - 1 VIEW  COMPARISON:  April 15, 2014, CT chest April 18, 2014  FINDINGS: The heart size and mediastinal contours are within normal limits. There is no focal pneumonia, pulmonary edema, or pleural effusion. There is a question 1.9 x 1.1 cm nodule in the right mid to lower lung. This is not definitely appreciated on prior chest x-rays or chest CT. The visualized skeletal structures are stable.  IMPRESSION: No active cardiac disease.  Question 1.9 cm nodule in the right mid to lower lung. This is not definitely appreciated on the prior chest x-ray or chest CT. Recommend further evaluation with a chest CT on outpatient basis.   Electronically Signed   By: Abelardo Diesel M.D.   On: 05/30/2014 18:20    Scheduled Meds: . aspirin EC  81 mg Oral Daily  . atorvastatin  10 mg Oral q1800  . carvedilol  3.125 mg Oral Daily  . donepezil  5 mg Oral QHS  . insulin aspart  0-9 Units Subcutaneous 6 times per day  . latanoprost  1 drop Both Eyes QHS  . rivaroxaban  20 mg Oral Q supper  . sodium chloride  3 mL Intravenous Q12H   Continuous Infusions: . sodium chloride 50 mL/hr at 05/30/14 2304    Active Problems:   Syncope    Time spent: 30 min    Hever Castilleja, [facility] Pager  [phone]. If 7PM-7AM, please contact night-coverage at [email or web address], password [facility] 05/31/2014, 4:51 PM

## 2014-06-01 ENCOUNTER — Encounter (HOSPITAL_COMMUNITY): Payer: Self-pay | Admitting: Cardiology

## 2014-06-01 DIAGNOSIS — R001 Bradycardia, unspecified: Secondary | ICD-10-CM

## 2014-06-01 DIAGNOSIS — I255 Ischemic cardiomyopathy: Principal | ICD-10-CM

## 2014-06-01 DIAGNOSIS — M79673 Pain in unspecified foot: Secondary | ICD-10-CM

## 2014-06-01 DIAGNOSIS — I5022 Chronic systolic (congestive) heart failure: Secondary | ICD-10-CM

## 2014-06-01 DIAGNOSIS — I639 Cerebral infarction, unspecified: Secondary | ICD-10-CM

## 2014-06-01 DIAGNOSIS — R55 Syncope and collapse: Secondary | ICD-10-CM

## 2014-06-01 HISTORY — DX: Bradycardia, unspecified: R00.1

## 2014-06-01 LAB — CBC
HCT: 30.9 % — ABNORMAL LOW (ref 39.0–52.0)
Hemoglobin: 10.2 g/dL — ABNORMAL LOW (ref 13.0–17.0)
MCH: 29.9 pg (ref 26.0–34.0)
MCHC: 33 g/dL (ref 30.0–36.0)
MCV: 90.6 fL (ref 78.0–100.0)
PLATELETS: 161 10*3/uL (ref 150–400)
RBC: 3.41 MIL/uL — ABNORMAL LOW (ref 4.22–5.81)
RDW: 13.7 % (ref 11.5–15.5)
WBC: 6.5 10*3/uL (ref 4.0–10.5)

## 2014-06-01 LAB — BASIC METABOLIC PANEL
Anion gap: 3 — ABNORMAL LOW (ref 5–15)
BUN: 11 mg/dL (ref 6–23)
CO2: 28 mmol/L (ref 19–32)
Calcium: 8.5 mg/dL (ref 8.4–10.5)
Chloride: 108 mmol/L (ref 96–112)
Creatinine, Ser: 1.48 mg/dL — ABNORMAL HIGH (ref 0.50–1.35)
GFR calc Af Amer: 52 mL/min — ABNORMAL LOW (ref >90)
GFR calc non Af Amer: 45 mL/min — ABNORMAL LOW (ref >90)
GLUCOSE: 80 mg/dL (ref 70–99)
POTASSIUM: 3.9 mmol/L (ref 3.5–5.1)
SODIUM: 139 mmol/L (ref 135–145)

## 2014-06-01 LAB — GLUCOSE, CAPILLARY
Glucose-Capillary: 86 mg/dL (ref 70–99)
Glucose-Capillary: 92 mg/dL (ref 70–99)
Glucose-Capillary: 96 mg/dL (ref 70–99)
Glucose-Capillary: 176 mg/dL — ABNORMAL HIGH (ref 70–99)

## 2014-06-01 LAB — VITAMIN B12: Vitamin B-12: 494 pg/mL (ref 211–911)

## 2014-06-01 LAB — FERRITIN: Ferritin: 40 ng/mL (ref 22–322)

## 2014-06-01 LAB — IRON AND TIBC
Iron: 49 ug/dL (ref 42–165)
Saturation Ratios: 20 % (ref 20–55)
TIBC: 251 ug/dL (ref 215–435)
UIBC: 202 ug/dL (ref 125–400)

## 2014-06-01 LAB — HEMOGLOBIN A1C
Hgb A1c MFr Bld: 6.5 % — ABNORMAL HIGH (ref 4.8–5.6)
MEAN PLASMA GLUCOSE: 140 mg/dL

## 2014-06-01 LAB — CK: Total CK: 51 U/L (ref 7–232)

## 2014-06-01 LAB — RETICULOCYTES
RBC.: 3.58 MIL/uL — ABNORMAL LOW (ref 4.22–5.81)
Retic Count, Absolute: 53.7 10*3/uL (ref 19.0–186.0)
Retic Ct Pct: 1.5 % (ref 0.4–3.1)

## 2014-06-01 LAB — FOLATE: Folate: 8.6 ng/mL

## 2014-06-01 MED ORDER — CHLORHEXIDINE GLUCONATE 4 % EX LIQD
60.0000 mL | Freq: Once | CUTANEOUS | Status: AC
  Administered 2014-06-01: 4 via TOPICAL
  Filled 2014-06-01: qty 60

## 2014-06-01 MED ORDER — SODIUM CHLORIDE 0.9 % IV SOLN
INTRAVENOUS | Status: DC
  Administered 2014-06-02: 07:00:00 via INTRAVENOUS

## 2014-06-01 MED ORDER — CHLORHEXIDINE GLUCONATE 4 % EX LIQD
60.0000 mL | Freq: Once | CUTANEOUS | Status: AC
  Administered 2014-06-02: 4 via TOPICAL
  Filled 2014-06-01: qty 60

## 2014-06-01 MED ORDER — SPIRONOLACTONE 25 MG PO TABS
12.5000 mg | ORAL_TABLET | Freq: Every day | ORAL | Status: DC
  Administered 2014-06-01 – 2014-06-04 (×3): 12.5 mg via ORAL
  Filled 2014-06-01 (×3): qty 1

## 2014-06-01 MED ORDER — SODIUM CHLORIDE 0.9 % IR SOLN
80.0000 mg | Status: DC
  Filled 2014-06-01 (×2): qty 2

## 2014-06-01 MED ORDER — RIVAROXABAN 20 MG PO TABS: 20.0000 mg | ORAL_TABLET | Freq: Every day | ORAL | Status: DC

## 2014-06-01 MED ORDER — CEFAZOLIN SODIUM-DEXTROSE 2-3 GM-% IV SOLR
2.0000 g | INTRAVENOUS | Status: DC
Start: 2014-06-01 — End: 2014-06-02
  Filled 2014-06-01: qty 50

## 2014-06-01 MED ORDER — RIVAROXABAN 15 MG PO TABS
15.0000 mg | ORAL_TABLET | Freq: Two times a day (BID) | ORAL | Status: DC
  Administered 2014-06-01 – 2014-06-02 (×2): 15 mg via ORAL
  Filled 2014-06-01 (×2): qty 1

## 2014-06-01 NOTE — Progress Notes (Signed)
ANTICOAGULATION CONSULT NOTE - Initial Consult  Pharmacy Consult for Xarelto Indication: DVT  No Known Allergies  Patient Measurements: Height: 5' 9"  (175.3 cm) Weight: 133 lb 4.8 oz (60.464 kg) IBW/kg (Calculated) : 70.7  Vital Signs: Temp: 98 F (36.7 C) (02/17 0800) Temp Source: Oral (02/17 0800) BP: 142/70 mmHg (02/17 0800) Pulse Rate: 56 (02/17 0800)  Labs:  Recent Labs  05/30/14 1739 06/01/14 0404  HGB 11.7* 10.2*  HCT 35.1* 30.9*  PLT 163 161  CREATININE 1.51* 1.48*  CKTOTAL  --  51    Estimated Creatinine Clearance: 36.9 mL/min (by C-G formula based on Cr of 1.48).   Medical History: Past Medical History  Diagnosis Date  . Hypertension   . Dilated cardiomyopathy     2/12: EF 30-35%, trivial AI, mild RAE.  EF 2014 40 -45%  . CAD (coronary artery disease)     LHC 9/05 with Dr. Einar Gip:  dLM 20-30%, LAD 85%, oD1 20-30%.  PCI:  Taxus DES to LAD; Dx jailed and tx with POBA.  Last myoview 12/10: inf scar, no ischemia, EF 29%.  . DVT (deep venous thrombosis)   . Pulmonary embolus     chronic coumadin  . Hyperhomocystinemia   . PFO (patent foramen ovale)     Not mentioned on 2014 echo.  [name] HLD (hyperlipidemia)   . Crohn's disease   . Nephrolithiasis   . Diabetes mellitus     11/05/11 "borderline; don't take medications"  . Stroke 1993    "left arm can't hold steady; leg too"  . Arthritis     "used to have a touch in my legs"  . Memory difficulties   . Pneumonia ~ 2011    09-22-13 denies any recent SOB or breathing problems  . Swelling of both ankles      09-22-13 occ.feet, but denies pain.    Assessment: 73 YOM who has history of stroke and PE while on warfarin who was switched to Xarelto. Has chronic DVT in both legs, but doppler yesterday revealed new superficial saphenous thrombus. Discussed with Dr. Karleen Hampshire, and patient to undergo therapy for acute DVT with Xarelto.  SCr is 1.48 and CrCl using actual body weight is 23m/min. Xarelto can be used for DVT  treatment until CrCl is <330mmin.  hgb 10.2, plts 161. No bleeding noted.   Goal of Therapy:    Monitor platelets by anticoagulation protocol: Yes   Plan:  -starting with tonight's dose, will give Xarelto 1553mIDwc x21 days. On 3/10, patient will transition back to Xarelto 60m64mupper -CBC q72h minimum -pharmacy will follow peripherally  Rochanda Harpham D. Legna Mausolf, PharmD, BCPS Clinical Pharmacist Pager: [phone] 1:07 PM

## 2014-06-01 NOTE — Consult Note (Addendum)
ELECTROPHYSIOLOGY CONSULT NOTE  Patient ID: Grant Lara, MRN: [id number], DOB/AGE: 76/06/1940 76 y.o. Admit date: 05/30/2014 Date of Consult: 06/01/2014  Primary Physician: Lynne Logan, MD Primary Cardiologist: [facility] Chief Complaint: synocpe   HPI Grant Lara is a 74 y.o. male  Was admitted today with an episode of syncope. He was sitting at the chair. His wife heard gurgling sounds. She came in and he was unresponsive. 911 was called and he aroused within 1-2 minutes.  His cardiovascular history is notable for depressed left ventricular function with ejection fraction 20-25% by echo 2/16 which has been persistently depressed since 8/15. Notably 2/14 EF was 40-45% although it was also noted to be 30% in 2005. Last catheterization that I can find is 2005. A nuclear medicine scan 12/10 demonstrated ejection fraction 29% with evidence of a prior infarct without significant ischemia  He has a history of DVT and pulmonary embolism. He previously been on Coumadin. There is a comment in the chart that he "failed" and is now on Rivaroxaban and has been since 8/15. He had a stroke 1/16 which is left with some cognitive impairment. There is also been some weakness and dysphagia. Apparently this involved both hemispheres. It is noted that aspirin has been added to his NOAC because of recurrent thromboembolism/thrombus.     He is noted on telemetry to have heart rates in the 40s. Review of the telemetry from January 2016 demonstrated nonsustained ventricular tachycardia up to 17 beats   Past Medical History  Diagnosis Date  . Hypertension   . Dilated cardiomyopathy     2/12: EF 30-35%, trivial AI, mild RAE.  EF 2014 40 -45%  . CAD (coronary artery disease)     LHC 9/05 with Dr. Einar Gip:  dLM 20-30%, LAD 85%, oD1 20-30%.  PCI:  Taxus DES to LAD; Dx jailed and tx with POBA.  Last myoview 12/10: inf scar, no ischemia, EF 29%.  . DVT (deep venous thrombosis)   . Pulmonary embolus     chronic coumadin   . Hyperhomocystinemia   . PFO (patent foramen ovale)     Not mentioned on 2014 echo.  [name] HLD (hyperlipidemia)   . Crohn's disease   . Nephrolithiasis   . Diabetes mellitus     11/05/11 "borderline; don't take medications"  . Stroke 1993    "left arm can't hold steady; leg too"  . Arthritis     "used to have a touch in my legs"  . Memory difficulties   . Pneumonia ~ 2011    09-22-13 denies any recent SOB or breathing problems  . Swelling of both ankles      09-22-13 occ.feet, but denies pain.  . Bradycardia 06/01/2014      Surgical History:  Past Surgical History  Procedure Laterality Date  . Colon surgery  1994; 1996    "for Crohn's disease"  . Appendectomy       Home Meds: Prior to Admission medications   Medication Sig Start Date End Date Taking? Authorizing Provider  acetaminophen (TYLENOL) 325 MG tablet Take 2 tablets (650 mg total) by mouth every 6 (six) hours as needed for mild pain, moderate pain or fever. 11/23/13   Modena Jansky, MD  aspirin EC 81 MG EC tablet Take 1 tablet (81 mg total) by mouth daily. 04/18/14   Maryann Mikhail, DO  atorvastatin (LIPITOR) 10 MG tablet Take 1 tablet (10 mg total) by mouth daily at 6 PM. 04/18/14   Cristal Ford, DO  carvedilol (COREG) 3.125 MG tablet Take 1 tablet (3.125 mg total) by mouth daily. 02/07/14   Minus Breeding, MD  donepezil (ARICEPT) 5 MG tablet Take 5 mg by mouth at bedtime.    Historical Provider, MD  glucose monitoring kit (FREESTYLE) monitoring kit 1 each by Does not apply route as needed for other. Please give 100 lancets and testing strips.  Check sugars every morning before breakfast and every evening before dinner 08/23/13   Debbe Odea, MD  latanoprost (XALATAN) 0.005 % ophthalmic solution Place 1 drop into both eyes at bedtime. 06/29/13   Historical Provider, MD  lisinopril (PRINIVIL,ZESTRIL) 2.5 MG tablet Take 1 tablet (2.5 mg total) by mouth every morning. 02/07/14   Minus Breeding, MD  rivaroxaban (XARELTO) 20  MG TABS tablet Take 1 tablet (20 mg total) by mouth daily with supper. 05/17/14   Minus Breeding, MD  tamsulosin (FLOMAX) 0.4 MG CAPS capsule Take 1 capsule (0.4 mg total) by mouth daily. 04/18/14   Cristal Ford, DO    Inpatient Medications:  . aspirin EC  81 mg Oral Daily  . atorvastatin  10 mg Oral q1800  . carvedilol  3.125 mg Oral BID WC  . donepezil  5 mg Oral QHS  . insulin aspart  0-9 Units Subcutaneous 6 times per day  . latanoprost  1 drop Both Eyes QHS  . lisinopril  2.5 mg Oral Daily  . rivaroxaban  15 mg Oral BID WC   Followed by  . [START ON 06/23/2014] rivaroxaban  20 mg Oral Q supper  . sodium chloride  3 mL Intravenous Q12H  . spironolactone  12.5 mg Oral Daily     Allergies: No Known Allergies  History   Social History  . Marital Status: Married    Spouse Name: N/A  . Number of Children: N/A  . Years of Education: N/A   Occupational History  . Not on file.   Social History Main Topics  . Smoking status: Current Every Day Smoker -- 0.50 packs/day for 53 years    Types: Cigarettes  . Smokeless tobacco: Never Used  . Alcohol Use: No  . Drug Use: No  . Sexual Activity: Not Currently   Other Topics Concern  . Not on file   Social History Narrative     Family History  Problem Relation Age of Onset  . Diabetes type II Mother   . CAD Father   . Diabetes type II Brother      ROS:  Please see the history of present illness.     All other systems reviewed and negative.    Physical Exam:    Blood pressure 128/68, pulse 50, temperature 98 F (36.7 C), temperature source Oral, resp. rate 16, height _0  (1.753 m), weight 133 lb 4.8 oz (60.464 kg), SpO2 99 %. General: Well developed, well nourished male in no acute distress. Head: Normocephalic, atraumatic, sclera non-icteric, no xanthomas, nares are without discharge. EENT: normal Lymph Nodes:  none Back: without scoliosis/kyphosis , no CVA tendersness Neck: Negative for carotid bruits. JVD not  elevated. Lungs: Clear bilaterally to auscultation without wheezes, rales, or rhonchi. Breathing is unlabored. Heart: RRR with S1 S2.  2/6 systolic murmur , rubs, or gallops appreciated. Abdomen: Soft, non-tender, non-distended with normoactive bowel sounds. No hepatomegaly. No rebound/guarding. No obvious abdominal masses. Msk:  Strength and tone appear normal for age. Extremities: No clubbing or cyanosis. No  edema.  Distal pedal pulses are 2+ and equal bilaterally. Skin: Warm and Dry Neuro:  Alert and oriented X 3. CN III-XII intact  Right sided hemiparesis dysarthria Psych:  Responds to questions appropriately with a normal affect.      Labs: Cardiac Enzymes  Recent Labs  06/01/14 0404  CKTOTAL 51   CBC Lab Results  Component Value Date   WBC 6.5 06/01/2014   HGB 10.2* 06/01/2014   HCT 30.9* 06/01/2014   MCV 90.6 06/01/2014   PLT 161 06/01/2014   PROTIME: No results for input(s): LABPROT, INR in the last 72 hours. Chemistry  Recent Labs Lab 06/01/14 0404  NA 139  K 3.9  CL 108  CO2 28  BUN 11  CREATININE 1.48*  CALCIUM 8.5  GLUCOSE 80   Lipids Lab Results  Component Value Date   CHOL 149 04/16/2014   HDL 48 04/16/2014   LDLCALC 80 04/16/2014   TRIG 104 04/16/2014   BNP PRO B NATRIURETIC PEPTIDE (BNP)  Date/Time Value Ref Range Status  01/31/2011 06:55 AM 275.0* 0 - 125 pg/mL Final   Miscellaneous Lab Results  Component Value Date   DDIMER 5.75* 11/20/2013    Radiology/Studies:  Ct Head Wo Contrast  05/30/2014   CLINICAL DATA:  Weakness  EXAM: CT HEAD WITHOUT CONTRAST  TECHNIQUE: Contiguous axial images were obtained from the base of the skull through the vertex without intravenous contrast.  COMPARISON:  CT 04/15/2014 and MRI same day  FINDINGS: No skull fracture is noted. Paranasal sinuses and mastoid air cells are unremarkable. Atherosclerotic calcifications of carotid siphon again noted.  Stable cerebral atrophy. No intracranial hemorrhage, mass  effect or midline shift. Again noted remote left cerebellar infarct. Stable chronic lacunar infarct in right basal ganglia. Stable chronic infarct in right occipital lobe. There are better visualized chronic appearing infarcts now in left parieto temporal region see axial image 22. Also chronic appearing infarcts in left frontal lobe see axial image 21. No definite acute cortical infarction. Clinical correlation is necessary. No mass lesion is noted on this unenhanced scan.  IMPRESSION: No intracranial hemorrhage, mass effect or midline shift. Again noted multiple chronic infarcts as described above. Normal evolution with chronic appearance of recent infarcts in left frontal lobe and left frontoparietal lobe posteriorly. No definite acute cortical infarction. If there is high clinical suspicious for recurrent infarct further correlation with MRI with diffusion imaging is recommended.   Electronically Signed   By: Lahoma Crocker M.D.   On: 05/30/2014 19:57   Mri Brain Without Contrast  05/30/2014   CLINICAL DATA:  Weakness, possible syncopal episode, found by wife slumped over table, now improving. Dementia. History of hyperlipidemia, diabetes, stroke.  EXAM: MRI HEAD WITHOUT CONTRAST  TECHNIQUE: Multiplanar, multiecho pulse sequences of the brain and surrounding structures were obtained without intravenous contrast.  COMPARISON:  CT of the head May 30, 2014 at 1950 hours and MRI of the brain April 15, 2014  FINDINGS: No reduced diffusion to suggest acute ischemia. Minimal residual reduced diffusion within LEFT parietal and frontal lobes, present on prior imaging, normalized ADC values. The additional tiny acute infarcts from prior examination are no longer apparent by diffusion imaging. No susceptibility artifact to suggest hemorrhage.  Moderate ventriculomegaly, on the basis of global parenchymal brain volume loss, with somewhat disproportionate frontoparietal sulcal enlargement, similar to prior examination.  Bilateral frontal, bilateral parietal and bilateral occipital lobe infarcts present on prior examination. Multiple LEFT greater than RIGHT cerebellar remote infarcts. Scattered additional white matter T2 hyperintensities, exclusive of the gliosis again noted likely representing chronic small vessel ischemic disease. Remote  RIGHT thalamus and bilateral basal ganglia small infarcts.  No midline shift, mass effect or mass lesions. No abnormal extra-axial fluid collections. Normal major intracranial vascular flow voids seen at the skull base.  Mild paranasal sinus mucosal thickening. Mastoid air cells appear well-aerated. No abnormal sellar expansion. No cerebellar tonsillar ectopia. Patient appears edentulous.  IMPRESSION: No acute intracranial process.  Subacute to early chronic LEFT frontoparietal infarcts. Multifocal additional remote infarcts spanning multiple vascular territories suggests embolic disease. Basal ganglia and thalamus remote lacunar infarcts.  Moderate to severe global parenchymal brain volume loss.   Electronically Signed   By: Elon Alas   On: 05/30/2014 22:23   Dg Chest Portable 1 View  05/30/2014   CLINICAL DATA:  Chest pain and shortness of breath today  EXAM: PORTABLE CHEST - 1 VIEW  COMPARISON:  April 15, 2014, CT chest April 18, 2014  FINDINGS: The heart size and mediastinal contours are within normal limits. There is no focal pneumonia, pulmonary edema, or pleural effusion. There is a question 1.9 x 1.1 cm nodule in the right mid to lower lung. This is not definitely appreciated on prior chest x-rays or chest CT. The visualized skeletal structures are stable.  IMPRESSION: No active cardiac disease.  Question 1.9 cm nodule in the right mid to lower lung. This is not definitely appreciated on the prior chest x-ray or chest CT. Recommend further evaluation with a chest CT on outpatient basis.   Electronically Signed   By: Abelardo Diesel M.D.   On: 05/30/2014 18:20   Dg Foot 2 Views  Left  05/31/2014   CLINICAL DATA:  Generalized pain in left foot with no injury, chronic, initial evaluation  EXAM: LEFT FOOT - 2 VIEW  COMPARISON:  None.  FINDINGS: There is no evidence of fracture or dislocation. There is no evidence of arthropathy or other focal bone abnormality. Soft tissues are unremarkable.  IMPRESSION: Negative.   Electronically Signed   By: Skipper Cliche M.D.   On: 05/31/2014 19:32    EKG: sinus 48 15/10/48  Tel VT NS 1/16 hospitalizaiaton   Assessment and Plan:   Syncope   Ischemic cardiomyopathy  StrokeTIA  Pulmonary embolism  Renal insufficiency  Sinus bradycardia  The patient has syncope in the context of ischemic cardiomyopathy. This cohort of patients has an incidence of appropriate ICD shock equivalent to that of VT/VF survivors and ICD implantation is recommended for secondary prevention. This is a class I recommendation. I reviewed this with his wife over the telephone and she would like to proceed along this course. This is true not withstanding the neurological consequences from his strokes. I think that this is a reasonable decision.  In addition, I concur with Dr. Aundra Dubin regarding the need for hematological input related to his coagulation issues and the best strategy for anticoagulation. In most cohort of patients, aspirin added to either NOAC or warfarin, outside of the context of either an ACS in this case is a former or ACS/valve and the case of the latter is associated with higher risk and no demonstrable benefit.  Have reviewed the potential benefits and risks of ICD implantation including but not limited to death, perforation of heart or lung, lead dislodgement, infection,  device malfunction and inappropriate shocks.  The patient and family express understanding  and are willing to proceed.          Virl Axe

## 2014-06-01 NOTE — Evaluation (Signed)
Occupational Therapy Evaluation Patient Details Name: Grant Lara MRN: [id number] DOB: January 19, 1941 Today's Date: 06/01/2014    History of Present Illness This is a 74 y.o. year old male with significant past medical history of multiple medical problems including embolic stroke, DVT, PE on coumadin-now on xarelto, CAD s/p stent, type 2 DM, CKD, chronic systolic heart failure w/ EF 25-30% presenting with syncope   Clinical Impression   PTA pt lived at home and reports that he was independent with ADLs, however no family present to confirm. Pt required min A for balance during functional mobility and Assist for LB ADLs. Unclear how much assistance pt's wife can provide. Recommending HHOT at this time, however pt may need SNF if wife cannot provide 24/7 Supervision and assistance with ADLs. Pt will benefit from acute OT to address functional mobility and ADLs.     Follow Up Recommendations  Home health OT;Supervision/Assistance - 24 hour    Equipment Recommendations  None recommended by OT    Recommendations for Other Services       Precautions / Restrictions Precautions Precautions: Fall Restrictions Weight Bearing Restrictions: No      Mobility Bed Mobility Overal bed mobility: Modified Independent                Transfers Overall transfer level: Needs assistance Equipment used: None Transfers: Sit to/from Stand Sit to Stand: Supervision                   ADL Overall ADL's : Needs assistance/impaired Eating/Feeding: Sitting;Modified independent   Grooming: Oral care;Wash/dry face;Wash/dry hands;Standing;Minimal assistance Grooming Details (indicate cue type and reason): Min A to maintain dynamic balance. Pt with slow processing and decreased coordination.  Upper Body Bathing: Set up;Sitting   Lower Body Bathing: Sit to/from stand;Minimal assistance   Upper Body Dressing : Minimal assistance;Sitting   Lower Body Dressing: Moderate assistance;Sit to/from  stand   Toilet Transfer: Minimal assistance;Ambulation Toilet Transfer Details (indicate cue type and reason): pt with balance deficits ambulating in room. Reaches for furniture to hold onto.          Functional mobility during ADLs: Minimal assistance General ADL Comments: Pt is likely close to baseline, however am concerned about his risk for falling at home and unknown level of support that his wife can provide.      Vision Additional Comments: Pt wears bifocals at baseline and reports no visual changes.           Pertinent Vitals/Pain Pain Assessment: No/denies pain     Hand Dominance Right   Extremity/Trunk Assessment Upper Extremity Assessment Upper Extremity Assessment: RUE deficits/detail;LUE deficits/detail RUE Deficits / Details: pt presents with decreased strength and coordination in Bil UEs; difficult to fully assess due to cognition  RUE Coordination: decreased fine motor LUE Deficits / Details: pt presents with decreased strength and coordination in Bil UEs; difficult to fully assess due to cognition LUE Coordination: decreased fine motor   Lower Extremity Assessment Lower Extremity Assessment: Defer to PT evaluation   Cervical / Trunk Assessment Cervical / Trunk Assessment: Normal   Communication Communication Communication: Expressive difficulties   Cognition Arousal/Alertness: Awake/alert Behavior During Therapy: WFL for tasks assessed/performed;Flat affect Overall Cognitive Status: No family/caregiver present to determine baseline cognitive functioning Area of Impairment: Safety/judgement;Awareness;Problem solving;Memory     Memory: Decreased short-term memory   Safety/Judgement: Decreased awareness of safety;Decreased awareness of deficits Awareness: Emergent Problem Solving: Slow processing;Requires verbal cues  Home Living Family/patient expects to be discharged to:: Private residence Living Arrangements: Spouse/significant  other Available Help at Discharge: Family Type of Home: House Home Access: Stairs to enter Technical brewer of Steps: several   Home Layout: One level     Bathroom Shower/Tub: Teacher, early years/pre: Standard         Additional Comments: pt with expressive difficulties and no family present      Prior Functioning/Environment Level of Independence: Independent        Comments: pt reports he is independent with ADLs. No family present to confirm.     OT Diagnosis: Generalized weakness;Cognitive deficits   OT Problem List: Decreased strength;Decreased activity tolerance;Impaired balance (sitting and/or standing);Decreased cognition;Decreased coordination;Decreased safety awareness   OT Treatment/Interventions: Self-care/ADL training;Therapeutic exercise;Neuromuscular education;Energy conservation;DME and/or AE instruction;Therapeutic activities;Cognitive remediation/compensation;Patient/family education;Balance training    OT Goals(Current goals can be found in the care plan section) Acute Rehab OT Goals Patient Stated Goal: to see my wife OT Goal Formulation: With patient Time For Goal Achievement: 06/15/14 Potential to Achieve Goals: Good ADL Goals Pt Will Perform Grooming: with supervision;standing Pt Will Perform Lower Body Bathing: with supervision;sit to/from stand Pt Will Perform Upper Body Dressing: with supervision;sitting Pt Will Perform Lower Body Dressing: with supervision;sit to/from stand Pt Will Transfer to Toilet: with supervision;ambulating Pt Will Perform Toileting - Clothing Manipulation and hygiene: with supervision;sit to/from stand  OT Frequency: Min 2X/week    End of Session Equipment Utilized During Treatment: Gait belt Nurse Communication: Mobility status  Activity Tolerance: Patient tolerated treatment well Patient left: in bed;with call bell/phone within reach;with bed alarm set   Time: 1557-1619 OT Time Calculation  (min): 22 min Charges:  OT General Charges $OT Visit: 1 Procedure OT Evaluation $Initial OT Evaluation Tier I: 1 Procedure G-Codes: OT G-codes **NOT FOR INPATIENT CLASS** Functional Assessment Tool Used: clinical judgement Functional Limitation: Self care Self Care Current Status (Z6629): At least 20 percent but less than 40 percent impaired, limited or restricted Self Care Goal Status (U7654): At least 1 percent but less than 20 percent impaired, limited or restricted  Juluis Rainier 06/01/2014, 5:57 PM  Cyndie Chime, OTR/L Occupational Therapist [phone] (pager)

## 2014-06-01 NOTE — Consult Note (Signed)
Reason for Consult: syncope on  Xarelto for a fib   Referring Physician: Dr. Karleen Hampshire   PCP:  Lynne Logan, MD  Primary Cardiologist:Dr. Jasper Loser Madry is an 74 y.o. male.    Chief Complaint: syncope on 05/30/14  HPI: 74 y.o. year old male with significant past medical history of multiple medical problems including embolic stroke, DVT, PE on coumadin-- failed, now on xarelto, CAD s/p stent to LAD 2005, PTCA of the stent jailed diag 1,  type 2 DM, CKD, chronic systolic heart failure w/ EF 25-30% presenting with syncope.  Wife stated today that she was in another room and heard him make a grunting noise X 2 and she ran in the room and he was slumped over starring ahead. Pt noted to have been recently admitted 09/5991 for embolic stroke across both hemispheres of the brain w/ lingering bilateral weakness and dysphagia. Pt tells me today that he has no memory of the episode. He did begin responding but slow to respond.  No chest pain no SOB.   Presented to the ER by EMS T 95.6-improved w/ blankets to 97.8, HT 40s-70s, resp 10s, BP 100s. CBC and CMET stable from last admission. Cr 1.5. hgb 11.7. Glu 255. Trop neg x1. CXR w/ no acute changes. Head CT w/ no acute intracranial hemorrhage-with noted multiple chronic infarcts. + LE/calf pain. Troponin was negative. No arrhthymias on tele. EKG on admit SB at 48 with PACs. On EKG Apr 15 2014 hr was 66.   Review of EKGs and tele strips his HR continues to decrease on same dose of coreg 3.125 BID. He has been on this medication since at least 10/2012.  Also with review, NSVT 17 beats with rates up to approx 200 in Jan.  One episode.  Tele here with HR 40 -48 freq with PVCs.  Echo 05/31/14:  Left ventricle: The cavity size was normal. Wall thickness was normal. Systolic function was severely reduced. The estimated ejection fraction was in the range of 20% to 25%. Diffuse hypokinesis. Left ventricular diastolic function parameters  were normal. - Aortic valve: There was mild regurgitation. - Mitral valve: There was mild regurgitation. - Right ventricle: The cavity size was mildly dilated. Systolic function was mildly reduced. - Right atrium: The atrium was mildly dilated.  Venous doppler 05/31/14: Essentially unchanged from previous Feb. 5, 2016 study, however, sonographic evidence of nearly occluding thrombus left greater saphenous vein which was not evident on prior study.-- on Xarelto - Findings consistent with chronic deep vein thrombosis involving the right lower extremity. - Findings consistent with chronic deep vein thrombosis involving the left lower extremity. - No evidence of Baker&'s cyst on the right or left.  Cardiac cath 2005: Briefly, the left main has distal 20-305% smooth stenosis and the circumflex is small and has mild noncritical coronary disease. Ramus intermediate is a large vessel. It has mild noncritical coronary disease.  The LAD was a large vessel with a large diagonal one and just after the bifurcation of the diagonal one there is an 85% stenosis and rec'd a stent.. The LAD also has mild luminal irregularity prior to this high grade stenosis and also the diagonal one has ostial 20-30% stenosis. The distal LAD has mild disease.   Past Medical History  Diagnosis Date  . Hypertension   . Dilated cardiomyopathy     2/12: EF 30-35%, trivial AI, mild RAE.  EF 2014 40 -45%  .  CAD (coronary artery disease)     LHC 9/05 with Dr. Einar Gip:  dLM 20-30%, LAD 85%, oD1 20-30%.  PCI:  Taxus DES to LAD; Dx jailed and tx with POBA.  Last myoview 12/10: inf scar, no ischemia, EF 29%.  . DVT (deep venous thrombosis)   . Pulmonary embolus     chronic coumadin  . Hyperhomocystinemia   . PFO (patent foramen ovale)     Not mentioned on 2014 echo.  [name] HLD (hyperlipidemia)   . Crohn's disease   . Nephrolithiasis   . Diabetes mellitus     11/05/11 "borderline; don't take  medications"  . Stroke 1993    "left arm can't hold steady; leg too"  . Arthritis     "used to have a touch in my legs"  . Memory difficulties   . Pneumonia ~ 2011    09-22-13 denies any recent SOB or breathing problems  . Swelling of both ankles      09-22-13 occ.feet, but denies pain.  . Bradycardia 06/01/2014    Past Surgical History  Procedure Laterality Date  . Colon surgery  1994; 1996    "for Crohn's disease"  . Appendectomy      Family History  Problem Relation Age of Onset  . Diabetes type II Mother   . CAD Father   . Diabetes type II Brother    Social History:  reports that he has been smoking Cigarettes.  He has a 26.5 pack-year smoking history. He has never used smokeless tobacco. He reports that he does not drink alcohol or use illicit drugs.  Allergies: No Known Allergies  Medications Prior to Admission  Medication Sig Dispense Refill  . acetaminophen (TYLENOL) 325 MG tablet Take 2 tablets (650 mg total) by mouth every 6 (six) hours as needed for mild pain, moderate pain or fever.    [name] aspirin EC 81 MG EC tablet Take 1 tablet (81 mg total) by mouth daily. 30 tablet 0  . atorvastatin (LIPITOR) 10 MG tablet Take 1 tablet (10 mg total) by mouth daily at 6 PM. 30 tablet 0  . carvedilol (COREG) 3.125 MG tablet Take 1 tablet (3.125 mg total) by mouth daily. 90 tablet 3  . donepezil (ARICEPT) 5 MG tablet Take 5 mg by mouth at bedtime.    [name] glucose monitoring kit (FREESTYLE) monitoring kit 1 each by Does not apply route as needed for other. Please give 100 lancets and testing strips.  Check sugars every morning before breakfast and every evening before dinner 1 each 0  . latanoprost (XALATAN) 0.005 % ophthalmic solution Place 1 drop into both eyes at bedtime.    [name] lisinopril (PRINIVIL,ZESTRIL) 2.5 MG tablet Take 1 tablet (2.5 mg total) by mouth every morning. 90 tablet 3  . rivaroxaban (XARELTO) 20 MG TABS tablet Take 1 tablet (20 mg total) by mouth daily with supper. 20  tablet 0  . tamsulosin (FLOMAX) 0.4 MG CAPS capsule Take 1 capsule (0.4 mg total) by mouth daily. 30 capsule 0    Results for orders placed or performed during the hospital encounter of 05/30/14 (from the past 48 hour(s))  CBC with Differential/Platelet     Status: Abnormal   Collection Time: 05/30/14  5:39 PM  Result Value Ref Range   WBC 5.9 4.0 - 10.5 K/uL   RBC 3.86 (L) 4.22 - 5.81 MIL/uL   Hemoglobin 11.7 (L) 13.0 - 17.0 g/dL   HCT 35.1 (L) 39.0 - 52.0 %   MCV 90.9 78.0 -  100.0 fL   MCH 30.3 26.0 - 34.0 pg   MCHC 33.3 30.0 - 36.0 g/dL   RDW 13.1 11.5 - 15.5 %   Platelets 163 150 - 400 K/uL   Neutrophils Relative % 75 43 - 77 %   Neutro Abs 4.4 1.7 - 7.7 K/uL   Lymphocytes Relative 17 12 - 46 %   Lymphs Abs 1.0 0.7 - 4.0 K/uL   Monocytes Relative 7 3 - 12 %   Monocytes Absolute 0.4 0.1 - 1.0 K/uL   Eosinophils Relative 1 0 - 5 %   Eosinophils Absolute 0.1 0.0 - 0.7 K/uL   Basophils Relative 0 0 - 1 %   Basophils Absolute 0.0 0.0 - 0.1 K/uL  Basic metabolic panel     Status: Abnormal   Collection Time: 05/30/14  5:39 PM  Result Value Ref Range   Sodium 137 135 - 145 mmol/L   Potassium 4.5 3.5 - 5.1 mmol/L   Chloride 105 96 - 112 mmol/L   CO2 27 19 - 32 mmol/L   Glucose, Bld 255 (H) 70 - 99 mg/dL   BUN 12 6 - 23 mg/dL   Creatinine, Ser 1.51 (H) 0.50 - 1.35 mg/dL   Calcium 8.7 8.4 - 10.5 mg/dL   GFR calc non Af Amer 43 (L) >90 mL/min   GFR calc Af Amer 50 (L) >90 mL/min    Comment: (NOTE) The eGFR has been calculated using the CKD EPI equation. This calculation has not been validated in all clinical situations. eGFR's persistently <90 mL/min signify possible Chronic Kidney Disease.    Anion gap 5 5 - 15  Hemoglobin A1c     Status: Abnormal   Collection Time: 05/30/14  6:14 PM  Result Value Ref Range   Hgb A1c MFr Bld 6.5 (H) 4.8 - 5.6 %    Comment: (NOTE)         Pre-diabetes: 5.7 - 6.4         Diabetes: >6.4         Glycemic control for adults with diabetes:  <7.0    Mean Plasma Glucose 140 mg/dL    Comment: (NOTE) Performed At: [facility] [address] [place], [place] [id number] Lindon Romp MD QB:[id number]   I-stat troponin, ED     Status: None   Collection Time: 05/30/14  6:18 PM  Result Value Ref Range   Troponin i, poc 0.00 0.00 - 0.08 ng/mL   Comment 3            Comment: Due to the release kinetics of cTnI, a negative result within the first hours of the onset of symptoms does not rule out myocardial infarction with certainty. If myocardial infarction is still suspected, repeat the test at appropriate intervals.   Urinalysis, Routine w reflex microscopic     Status: Abnormal   Collection Time: 05/30/14  6:33 PM  Result Value Ref Range   Color, Urine RED (A) YELLOW    Comment: BIOCHEMICALS MAY BE AFFECTED BY COLOR   APPearance CLOUDY (A) CLEAR   Specific Gravity, Urine 1.026 1.005 - 1.030   pH 5.5 5.0 - 8.0   Glucose, UA 100 (A) NEGATIVE mg/dL   Hgb urine dipstick LARGE (A) NEGATIVE   Bilirubin Urine MODERATE (A) NEGATIVE   Ketones, ur 15 (A) NEGATIVE mg/dL   Protein, ur 100 (A) NEGATIVE mg/dL   Urobilinogen, UA 1.0 0.0 - 1.0 mg/dL   Nitrite NEGATIVE NEGATIVE   Leukocytes, UA SMALL (A) NEGATIVE  Urine microscopic-add on     Status: Abnormal   Collection Time: 05/30/14  6:33 PM  Result Value Ref Range   Squamous Epithelial / LPF RARE RARE   WBC, UA 0-2 <3 WBC/hpf   RBC / HPF TOO NUMEROUS TO COUNT <3 RBC/hpf   Bacteria, UA FEW (A) RARE   Casts HYALINE CASTS (A) NEGATIVE   Urine-Other MUCOUS PRESENT   CBG monitoring, ED     Status: Abnormal   Collection Time: 05/30/14  6:37 PM  Result Value Ref Range   Glucose-Capillary 222 (H) 70 - 99 mg/dL  Glucose, capillary     Status: Abnormal   Collection Time: 05/30/14 11:49 PM  Result Value Ref Range   Glucose-Capillary 112 (H) 70 - 99 mg/dL  Glucose, capillary     Status: Abnormal   Collection Time: 05/31/14  4:52 AM  Result Value Ref Range    Glucose-Capillary 127 (H) 70 - 99 mg/dL  Glucose, capillary     Status: None   Collection Time: 05/31/14  7:20 AM  Result Value Ref Range   Glucose-Capillary 81 70 - 99 mg/dL  Glucose, capillary     Status: Abnormal   Collection Time: 05/31/14 11:36 AM  Result Value Ref Range   Glucose-Capillary 108 (H) 70 - 99 mg/dL  Glucose, capillary     Status: Abnormal   Collection Time: 05/31/14  4:20 PM  Result Value Ref Range   Glucose-Capillary 115 (H) 70 - 99 mg/dL  Glucose, capillary     Status: Abnormal   Collection Time: 05/31/14  7:52 PM  Result Value Ref Range   Glucose-Capillary 105 (H) 70 - 99 mg/dL  Glucose, capillary     Status: Abnormal   Collection Time: 05/31/14 11:43 PM  Result Value Ref Range   Glucose-Capillary 120 (H) 70 - 99 mg/dL  CK     Status: None   Collection Time: 06/01/14  4:04 AM  Result Value Ref Range   Total CK 51 7 - 232 U/L  Basic metabolic panel     Status: Abnormal   Collection Time: 06/01/14  4:04 AM  Result Value Ref Range   Sodium 139 135 - 145 mmol/L   Potassium 3.9 3.5 - 5.1 mmol/L   Chloride 108 96 - 112 mmol/L   CO2 28 19 - 32 mmol/L   Glucose, Bld 80 70 - 99 mg/dL   BUN 11 6 - 23 mg/dL   Creatinine, Ser 1.48 (H) 0.50 - 1.35 mg/dL   Calcium 8.5 8.4 - 10.5 mg/dL   GFR calc non Af Amer 45 (L) >90 mL/min   GFR calc Af Amer 52 (L) >90 mL/min    Comment: (NOTE) The eGFR has been calculated using the CKD EPI equation. This calculation has not been validated in all clinical situations. eGFR's persistently <90 mL/min signify possible Chronic Kidney Disease.    Anion gap 3 (L) 5 - 15  CBC     Status: Abnormal   Collection Time: 06/01/14  4:04 AM  Result Value Ref Range   WBC 6.5 4.0 - 10.5 K/uL   RBC 3.41 (L) 4.22 - 5.81 MIL/uL   Hemoglobin 10.2 (L) 13.0 - 17.0 g/dL   HCT 30.9 (L) 39.0 - 52.0 %   MCV 90.6 78.0 - 100.0 fL   MCH 29.9 26.0 - 34.0 pg   MCHC 33.0 30.0 - 36.0 g/dL   RDW 13.7 11.5 - 15.5 %   Platelets 161 150 - 400 K/uL    Glucose, capillary  Status: None   Collection Time: 06/01/14  4:51 AM  Result Value Ref Range   Glucose-Capillary 86 70 - 99 mg/dL  Glucose, capillary     Status: None   Collection Time: 06/01/14  7:59 AM  Result Value Ref Range   Glucose-Capillary 92 70 - 99 mg/dL  Reticulocytes     Status: Abnormal   Collection Time: 06/01/14  1:00 PM  Result Value Ref Range   Retic Ct Pct 1.5 0.4 - 3.1 %   RBC. 3.58 (L) 4.22 - 5.81 MIL/uL   Retic Count, Manual 53.7 19.0 - 186.0 K/uL   Ct Head Wo Contrast  05/30/2014   CLINICAL DATA:  Weakness  EXAM: CT HEAD WITHOUT CONTRAST  TECHNIQUE: Contiguous axial images were obtained from the base of the skull through the vertex without intravenous contrast.  COMPARISON:  CT 04/15/2014 and MRI same day  FINDINGS: No skull fracture is noted. Paranasal sinuses and mastoid air cells are unremarkable. Atherosclerotic calcifications of carotid siphon again noted.  Stable cerebral atrophy. No intracranial hemorrhage, mass effect or midline shift. Again noted remote left cerebellar infarct. Stable chronic lacunar infarct in right basal ganglia. Stable chronic infarct in right occipital lobe. There are better visualized chronic appearing infarcts now in left parieto temporal region see axial image 22. Also chronic appearing infarcts in left frontal lobe see axial image 21. No definite acute cortical infarction. Clinical correlation is necessary. No mass lesion is noted on this unenhanced scan.  IMPRESSION: No intracranial hemorrhage, mass effect or midline shift. Again noted multiple chronic infarcts as described above. Normal evolution with chronic appearance of recent infarcts in left frontal lobe and left frontoparietal lobe posteriorly. No definite acute cortical infarction. If there is high clinical suspicious for recurrent infarct further correlation with MRI with diffusion imaging is recommended.   Electronically Signed   By: Lahoma Crocker M.D.   On: 05/30/2014 19:57    Mri Brain Without Contrast  05/30/2014   CLINICAL DATA:  Weakness, possible syncopal episode, found by wife slumped over table, now improving. Dementia. History of hyperlipidemia, diabetes, stroke.  EXAM: MRI HEAD WITHOUT CONTRAST  TECHNIQUE: Multiplanar, multiecho pulse sequences of the brain and surrounding structures were obtained without intravenous contrast.  COMPARISON:  CT of the head May 30, 2014 at 1950 hours and MRI of the brain April 15, 2014  FINDINGS: No reduced diffusion to suggest acute ischemia. Minimal residual reduced diffusion within LEFT parietal and frontal lobes, present on prior imaging, normalized ADC values. The additional tiny acute infarcts from prior examination are no longer apparent by diffusion imaging. No susceptibility artifact to suggest hemorrhage.  Moderate ventriculomegaly, on the basis of global parenchymal brain volume loss, with somewhat disproportionate frontoparietal sulcal enlargement, similar to prior examination. Bilateral frontal, bilateral parietal and bilateral occipital lobe infarcts present on prior examination. Multiple LEFT greater than RIGHT cerebellar remote infarcts. Scattered additional white matter T2 hyperintensities, exclusive of the gliosis again noted likely representing chronic small vessel ischemic disease. Remote RIGHT thalamus and bilateral basal ganglia small infarcts.  No midline shift, mass effect or mass lesions. No abnormal extra-axial fluid collections. Normal major intracranial vascular flow voids seen at the skull base.  Mild paranasal sinus mucosal thickening. Mastoid air cells appear well-aerated. No abnormal sellar expansion. No cerebellar tonsillar ectopia. Patient appears edentulous.  IMPRESSION: No acute intracranial process.  Subacute to early chronic LEFT frontoparietal infarcts. Multifocal additional remote infarcts spanning multiple vascular territories suggests embolic disease. Basal ganglia and thalamus remote lacunar  infarcts.  Moderate to severe global parenchymal brain volume loss.   Electronically Signed   By: Elon Alas   On: 05/30/2014 22:23   Dg Chest Portable 1 View  05/30/2014   CLINICAL DATA:  Chest pain and shortness of breath today  EXAM: PORTABLE CHEST - 1 VIEW  COMPARISON:  April 15, 2014, CT chest April 18, 2014  FINDINGS: The heart size and mediastinal contours are within normal limits. There is no focal pneumonia, pulmonary edema, or pleural effusion. There is a question 1.9 x 1.1 cm nodule in the right mid to lower lung. This is not definitely appreciated on prior chest x-rays or chest CT. The visualized skeletal structures are stable.  IMPRESSION: No active cardiac disease.  Question 1.9 cm nodule in the right mid to lower lung. This is not definitely appreciated on the prior chest x-ray or chest CT. Recommend further evaluation with a chest CT on outpatient basis.   Electronically Signed   By: Abelardo Diesel M.D.   On: 05/30/2014 18:20   Dg Foot 2 Views Left  05/31/2014   CLINICAL DATA:  Generalized pain in left foot with no injury, chronic, initial evaluation  EXAM: LEFT FOOT - 2 VIEW  COMPARISON:  None.  FINDINGS: There is no evidence of fracture or dislocation. There is no evidence of arthropathy or other focal bone abnormality. Soft tissues are unremarkable.  IMPRESSION: Negative.   Electronically Signed   By: Skipper Cliche M.D.   On: 05/31/2014 19:32    ROS: General:no colds or fevers, no weight changes Skin:no rashes or ulcers HEENT:no blurred vision, no congestion CV:see HPI PUL:see HPI GI:no diarrhea constipation or melena, no indigestion GU:no hematuria, no dysuria MS:no joint pain, no claudication Neuro + syncope, no lightheadedness Endo:no diabetes, no thyroid disease   Blood pressure 131/64, pulse 55, temperature 98 F (36.7 C), temperature source Oral, resp. rate 16, height 5' 9"  (1.753 m), weight 133 lb 4.8 oz (60.464 kg), SpO2 100 %.  Wt Readings from Last 3  Encounters:  06/01/14 133 lb 4.8 oz (60.464 kg)  05/17/14 132 lb 11.2 oz (60.192 kg)  05/05/14 133 lb 14.4 oz (60.737 kg)    PE: General:Pleasant but flat affect, NAD,  Skin:Warm and dry, brisk capillary refill HEENT:normocephalic, sclera clear, mucus membranes moist Neck:supple, no JVD, no bruits  Heart:S1S2 RRR without murmur, gallup, rub or click Lungs:clear without rales, rhonchi, or wheezes QJF:HLKT, non tender, + BS, do not palpate liver spleen or masses Ext:no lower ext edema, 2+ pedal pulses, 2+ radial pulses Neuro:alert and oriented, MAE, follows commands, + facial symmetry Tele SR to SB with rate 40-48 freq.  And PVCs  Assessment/Plan Principal Problem:   Syncope-  May have been related to bradycardia,  Though difficult to decrease coreg with recent NSVT with rapid rate, and NICM EF 20-25% - possibility of pacemaker - while he may be candidate for ICD as well. Would need to discuss pt has memory issues.  EP to see.    Active Problems:   Bradycardia-HR down to 40 at times on coreg 3.125 BID    NSVT- 17 beats in Jan.    Essential hypertension, benign   Chronic systolic heart failure-euvolemic   Pulmonary embolism- he had Pulmonary embolism while on coumadin and it was changed to xarelto. He currently denies any SOB, CP, tachycardia, but reports persistent bilateral leg pain . Venous duplex shows, Chronic DVT and acute thrombus left leg and he is on anti coagulation with xarelto and ASA.  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   1. Syncope: Patient does not remember much about this event.  He was sitting in a chair and found slumped over and gasping.   He recovered consciousness spontaneously. He has a long-standing low EF.  He has had NSVT documented at last admission in January.  He has been in sinus bradycardia here with rate in the 40s-50s on low dose Coreg and Aricept.  I am concerned that this could have been an arrhythmic event.  I think that our best choice here is going to be a dual chamber ICD.  I discussed this with Dr. Caryl Comes.  PR interval not prolonged so suspect he would not RV pace a significant amount. He will be seen by EP.  For now, continue the low dose of Coreg and Aricept.  2. CVA: Patient is s/p CVA, suspected embolic, in 5/44.  He was started on ASA 81 in addition to Xarelto at that time.  No documented atrial fibrillation.  He has had some cognitive worsening since then and also some mild right-sided weakness. 3. Venous thromboembolism:  Patient has had VTE while on coumadin and was switched to Xarelto.  He had a CVA on Xarelto and ASA 81 was added.  Now he has been found to have a new acute thrombus in the left greater saphenous vein.  Continue ASA 81 and Xarelto for now.  We will consult hematology for assistance here with his anticoagulation regimen.  4. Chronic systolic CHF: He is not volume overloaded on exam.  Continue current Coreg and lisinopril (creatinine up, will hold off on increasing lisinopril for now).  Will add spironolactone 12.5 mg daily. Does not need Lasix at this point.   Loralie Champagne 06/01/2014 4:20 PM

## 2014-06-01 NOTE — Evaluation (Signed)
Physical Therapy Evaluation Patient Details Name: SERAFIN DECATUR MRN: [id number] DOB: April 23, 1940 Today's Date: 06/01/2014   History of Present Illness  This is a 74 y.o. 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  Pt currently limited functionally due to the problems listed below.  (see problems list.)  Pt will benefit from PT to maximize function and safety to be able to get home safely with available assist of family.     Follow Up Recommendations Home health PT;Supervision for mobility/OOB    Equipment Recommendations  Other (comment);None recommended by PT (attempt to assess for any needs)    Recommendations for Other Services       Precautions / Restrictions Precautions Precautions: Fall Restrictions Weight Bearing Restrictions: No      Mobility  Bed Mobility Overal bed mobility: Modified Independent                Transfers Overall transfer level: Needs assistance Equipment used: None Transfers: Sit to/from Stand Sit to Stand: Supervision            Ambulation/Gait Ambulation/Gait assistance: Supervision Ambulation Distance (Feet): 200 Feet Assistive device: None Gait Pattern/deviations: Step-through pattern;Drifts right/left Gait velocity: slower Gait velocity interpretation: Below normal speed for age/gender General Gait Details: mildly unsteady throughout but improved as he warmed up.  Expect he uses structures and furniture at home to maintain stability.  Per pt he doesn't use assistive devices.  Stairs            Wheelchair Mobility    Modified Rankin (Stroke Patients Only) Modified Rankin (Stroke Patients Only) Modified Rankin: Moderate  disability     Balance Overall balance assessment: Needs assistance Sitting-balance support: No upper extremity supported Sitting balance-Leahy Scale: Good     Standing balance support: No upper extremity supported Standing balance-Leahy Scale: Fair                               Pertinent Vitals/Pain Pain Assessment: Faces Faces Pain Scale: No hurt    Home Living Family/patient expects to be discharged to:: Private residence Living Arrangements: Spouse/significant other Available Help at Discharge: Family Type of Home: House Home Access: Stairs to enter Entrance Stairs-Rails: None (answer suspect) Entrance Stairs-Number of Steps: several Home Layout: One level   Additional Comments: pt with expressive difficulties and no family present    Prior Function Level of Independence: Independent (per pt could do basic adl's for himself)         Comments: pt with expressive difficulties and no family present     Hand Dominance   Dominant Hand: Right    Extremity/Trunk Assessment   Upper Extremity Assessment: Defer to OT evaluation           Lower Extremity Assessment: Overall WFL for tasks assessed;RLE deficits/detail;LLE deficits/detail RLE Deficits / Details: difficult to assess fully, movements are made initially in synergy and difficult to v/t cue him to isolate. LLE Deficits / Details: difficult to assess fully, moves in synergy.     Communication   Communication: Expressive difficulties  Cognition Arousal/Alertness: Awake/alert Behavior During Therapy: WFL for tasks assessed/performed;Flat affect Overall Cognitive Status: No family/caregiver present to determine baseline  cognitive functioning                      General Comments      Exercises        Assessment/Plan    PT Assessment Patient needs continued PT services  PT Diagnosis Generalized weakness;Abnormality of gait   PT Problem List Decreased strength;Decreased  activity tolerance;Decreased balance;Decreased mobility;Decreased coordination;Decreased safety awareness  PT Treatment Interventions Gait training;Stair training;Functional mobility training;Therapeutic activities;Balance training;Patient/family education   PT Goals (Current goals can be found in the Care Plan section) Acute Rehab PT Goals Patient Stated Goal: pt unable to participate expressively PT Goal Formulation: Patient unable to participate in goal setting Time For Goal Achievement: 06/08/14 Potential to Achieve Goals: Good    Frequency Min 3X/week   Barriers to discharge        Co-evaluation               End of Session   Activity Tolerance: Patient tolerated treatment well Patient left: in chair;with call bell/phone within reach Nurse Communication: Mobility status    Functional Assessment Tool Used: clinical judgement Functional Limitation: Mobility: Walking and moving around Mobility: Walking and Moving Around Current Status (Y8016): At least 1 percent but less than 20 percent impaired, limited or restricted Mobility: Walking and Moving Around Goal Status [phone]): At least 1 percent but less than 20 percent impaired, limited or restricted    Time: 0941-1005 PT Time Calculation (min) (ACUTE ONLY): 24 min   Charges:   PT Evaluation $Initial PT Evaluation Tier I: 1 Procedure PT Treatments $Gait Training: 8-22 mins   PT G Codes:   PT G-Codes **NOT FOR INPATIENT CLASS** Functional Assessment Tool Used: clinical judgement Functional Limitation: Mobility: Walking and moving around Mobility: Walking and Moving Around Current Status (M2707): At least 1 percent but less than 20 percent impaired, limited or restricted Mobility: Walking and Moving Around Goal Status [phone]): At least 1 percent but less than 20 percent impaired, limited or restricted    Katelinn Justice, Tessie Fass 06/01/2014, 10:19 AM 06/01/2014  Donnella Sham, [place] [phone]  (pager)

## 2014-06-01 NOTE — Progress Notes (Signed)
[facility] PROGRESS NOTE  Grant Lara Payer VQM:[id number] DOB: 04-22-1940 DOA: 05/30/2014 PCP: Lynne Logan, MD  Brief Summary  This is a 74 y.o. 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 He had a witnessed syncopal episode while eating.  Pt was found unconscious by his wife. Pt noted to have been recently admitted 12/3265 for embolic stroke across both hemispheres of the brain w/ lingering bilateral weakness and dysphagia. Pt denies any prodrome prior to sxs. No CP, SOB, nausea, vomiting, abd pain. Pt unaware how long he was unconscious for. Head CT w/ no acute intracranial hemorrhage-with noted multiple chronic infarcts. Patient reported moderate pain in both legs. He underwent a VENOUS duplex and was found to have  Positive partially occluding chronic DVT bilaterally and acute thrombus in the superficial left greater saphenous vein. He is currently on xarelto.   Assessment/Plan  Syncope  -high risk for neurocardiogenic etiology,  he had recent admission for bihemispheric embolic stroke, baseline systolic CHF  -MRI on admission  No acute stroke (subacute from last month) -  2D ECHO:  EF 20-25% with diffuse hypokinesis, no new valvular abnl. Cardiology consulted for further recommendations. Tele monitoring overnight did not reveal any arrhythmias. There were no witnessed seizure activity. -  Orthostatics on 2/16 positive. Repeat orthostatics today.    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  -cont  BB >> given degree of heart failure, would probably continue beta blocker despite bradycardia unless he became symptomatic  4-CAD  -no active CP  -trop neg  -EKG on admission reviewed,showed sinus bradycardia with PAC'S. Unchanged from previous EKG's.  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  He currently denies any SOB, CP, tachycardia, but reports persistent bilateral leg pain . Venous duplex shows, Chronic DVT and acute superficial thrombus left leg and he is on anti coagulation.   Bilateral foot pain -  XR left foot  Ordered and films reviewed did not reveal any fractures.  -  ABI pending.  -  PT/OT assessment pending.   Dark urine -   CPK is within normal limits. He denies any dysuria symptoms.  UA is slightly abnormal and Urine cultures sent. Currently not on any antibiotics.    Anemia: Normocytic. baseline hemoglobin is around 11. No signs of bleeding. Get anemia panel and stool for occult blood. He reports having a colonoscopy done in 2012. Repeat cbc in am.   FEN/GI: heart healthy, carb modified diet  Prophylaxis: xarelto  Disposition: pending further evaluation by cardiology.  Code Status:Full Code   Consultants:  Cardiology.   Procedures:  CT head  MRI brain  Duplex lower extremity   Echo showed slight worsening of his LVEF to 20%.   Antibiotics:  none   HPI/Subjective:  Reports he has persistent pain in his lower extremities. He denies any dizziness or syncopal episodes overnight. He feels tired.  Objective: Filed Vitals:   05/31/14 2000 05/31/14 2340 06/01/14 0400 06/01/14 0800  BP: 114/61 119/53 136/60 142/70  Pulse: 57 55 51 56  Temp: 98.6 F (37 C) 98.9 F (37.2 C) 98.7 F (37.1 C) 98 F (36.7 C)  TempSrc:    Oral  Resp: 12 13 16 16   Height:      Weight:    60.464 kg (133 lb 4.8 oz)   SpO2: 98% 98% 100% 100%    Intake/Output Summary (Last 24 hours) at 06/01/14 1020 Last data filed at 06/01/14 0500  Gross per 24 hour  Intake    740 ml  Output   1450 ml  Net   -710 ml   Filed Weights   05/30/14 2258 05/31/14 0456 06/01/14 0400  Weight: 59.013 kg (130 lb 1.6 oz) 58.968 kg (130 lb) 60.464 kg (133 lb 4.8 oz)    Exam:   General:  Thin M, No acute distress, slow stuttered speech  Cardiovascular:  RRR, nl S1, S2, + gallop,, warm extremities  Respiratory:  CTAB, no increased WOB, no wheezing.   Abdomen:    soft, NT/ND, bowel sounds heard.   MSK:  Trace edema seen., enlarged joints, feet mildly TTP and slightly warm   Neuro:  5/5 symmetric strenght and sensation, no facial droop, slow stuttering speech. No new focal deficits.   Data Reviewed: Basic Metabolic Panel:  Recent Labs Lab 05/30/14 1739 06/01/14 0404  NA 137 139  K 4.5 3.9  CL 105 108  CO2 27 28  GLUCOSE 255* 80  BUN 12 11  CREATININE 1.51* 1.48*  CALCIUM 8.7 8.5   Liver Function Tests: No results for input(s): AST, ALT, ALKPHOS, BILITOT, PROT, ALBUMIN in the last 168 hours. No results for input(s): LIPASE, AMYLASE in the last 168 hours. No results for input(s): AMMONIA in the last 168 hours. CBC:  Recent Labs Lab 05/30/14 1739 06/01/14 0404  WBC 5.9 6.5  NEUTROABS 4.4  --   HGB 11.7* 10.2*  HCT 35.1* 30.9*  MCV 90.9 90.6  PLT 163 161   Cardiac Enzymes:  Recent Labs Lab 06/01/14 0404  CKTOTAL 51   BNP (last 3 results) No results for input(s): BNP in the last 8760 hours.  ProBNP (last 3 results) No results for input(s): PROBNP in the last 8760 hours.  CBG:  Recent Labs Lab 05/31/14 1620 05/31/14 1952 05/31/14 2343 06/01/14 0451 06/01/14 0759  GLUCAP 115* 105* 120* 86 92    No results found for this or any previous visit (from the past 240 hour(s)).   Studies: Ct Head Wo Contrast  05/30/2014   CLINICAL DATA:  Weakness  EXAM: CT  HEAD WITHOUT CONTRAST  TECHNIQUE: Contiguous axial images were obtained from the base of the skull through the vertex without intravenous contrast.  COMPARISON:  CT 04/15/2014 and MRI same day  FINDINGS: No skull fracture is noted. Paranasal sinuses and mastoid air cells are unremarkable. Atherosclerotic calcifications of carotid siphon again noted.  Stable cerebral atrophy. No intracranial hemorrhage, mass effect or midline shift. Again noted remote left cerebellar infarct. Stable chronic lacunar infarct in right basal ganglia. Stable chronic infarct in right occipital lobe. There are better visualized chronic appearing infarcts now in left parieto temporal region see axial image 22. Also chronic appearing infarcts in left frontal lobe see axial image 21. No definite acute cortical infarction. Clinical correlation is necessary. No mass lesion is noted on this unenhanced scan.  IMPRESSION: No intracranial hemorrhage, mass effect or midline shift.  Again noted multiple chronic infarcts as described above. Normal evolution with chronic appearance of recent infarcts in left frontal lobe and left frontoparietal lobe posteriorly. No definite acute cortical infarction. If there is high clinical suspicious for recurrent infarct further correlation with MRI with diffusion imaging is recommended.   Electronically Signed   By: Lahoma Crocker M.D.   On: 05/30/2014 19:57   Mri Brain Without Contrast  05/30/2014   CLINICAL DATA:  Weakness, possible syncopal episode, found by wife slumped over table, now improving. Dementia. History of hyperlipidemia, diabetes, stroke.  EXAM: MRI HEAD WITHOUT CONTRAST  TECHNIQUE: Multiplanar, multiecho pulse sequences of the brain and surrounding structures were obtained without intravenous contrast.  COMPARISON:  CT of the head May 30, 2014 at 1950 hours and MRI of the brain April 15, 2014  FINDINGS: No reduced diffusion to suggest acute ischemia. Minimal residual reduced diffusion within  LEFT parietal and frontal lobes, present on prior imaging, normalized ADC values. The additional tiny acute infarcts from prior examination are no longer apparent by diffusion imaging. No susceptibility artifact to suggest hemorrhage.  Moderate ventriculomegaly, on the basis of global parenchymal brain volume loss, with somewhat disproportionate frontoparietal sulcal enlargement, similar to prior examination. Bilateral frontal, bilateral parietal and bilateral occipital lobe infarcts present on prior examination. Multiple LEFT greater than RIGHT cerebellar remote infarcts. Scattered additional white matter T2 hyperintensities, exclusive of the gliosis again noted likely representing chronic small vessel ischemic disease. Remote RIGHT thalamus and bilateral basal ganglia small infarcts.  No midline shift, mass effect or mass lesions. No abnormal extra-axial fluid collections. Normal major intracranial vascular flow voids seen at the skull base.  Mild paranasal sinus mucosal thickening. Mastoid air cells appear well-aerated. No abnormal sellar expansion. No cerebellar tonsillar ectopia. Patient appears edentulous.  IMPRESSION: No acute intracranial process.  Subacute to early chronic LEFT frontoparietal infarcts. Multifocal additional remote infarcts spanning multiple vascular territories suggests embolic disease. Basal ganglia and thalamus remote lacunar infarcts.  Moderate to severe global parenchymal brain volume loss.   Electronically Signed   By: Elon Alas   On: 05/30/2014 22:23   Dg Chest Portable 1 View  05/30/2014   CLINICAL DATA:  Chest pain and shortness of breath today  EXAM: PORTABLE CHEST - 1 VIEW  COMPARISON:  April 15, 2014, CT chest April 18, 2014  FINDINGS: The heart size and mediastinal contours are within normal limits. There is no focal pneumonia, pulmonary edema, or pleural effusion. There is a question 1.9 x 1.1 cm nodule in the right mid to lower lung. This is not definitely  appreciated on prior chest x-rays or chest CT. The visualized skeletal structures are stable.  IMPRESSION: No active cardiac disease.  Question 1.9 cm nodule in the right mid to lower lung. This is not definitely appreciated on the prior chest x-ray or chest CT. Recommend further evaluation with a chest CT on outpatient basis.   Electronically Signed   By: Abelardo Diesel M.D.   On: 05/30/2014 18:20   Dg Foot 2 Views Left  05/31/2014   CLINICAL DATA:  Generalized pain in left foot with no injury, chronic, initial evaluation  EXAM: LEFT FOOT - 2 VIEW  COMPARISON:  None.  FINDINGS: There is no evidence of fracture or dislocation. There is no evidence of arthropathy or other focal bone abnormality. Soft tissues are unremarkable.  IMPRESSION: Negative.   Electronically Signed   By: Skipper Cliche M.D.   On: 05/31/2014 19:32    Scheduled Meds: .  aspirin EC  81 mg Oral Daily  . atorvastatin  10 mg Oral q1800  . carvedilol  3.125 mg Oral BID WC  . donepezil  5 mg Oral QHS  . insulin aspart  0-9 Units Subcutaneous 6 times per day  . latanoprost  1 drop Both Eyes QHS  . lisinopril  2.5 mg Oral Daily  . rivaroxaban  20 mg Oral Q supper  . sodium chloride  3 mL Intravenous Q12H   Continuous Infusions:    Active Problems:   Syncope    Time spent: 25 min    [facility] Pager [phone]. If 7PM-7AM, please contact night-coverage at [email or web address], password [facility] 06/01/2014, 10:20 AM

## 2014-06-01 NOTE — Progress Notes (Signed)
VASCULAR LAB PRELIMINARY  ARTERIAL  ABI completed:    RIGHT    LEFT    PRESSURE WAVEFORM  PRESSURE WAVEFORM  BRACHIAL 136 Triphasic BRACHIAL 149 Triphasic  DP 95 Dampened Monophasic DP 139 Dampened Monophasic  PT 108 Monophasic PT 154 Triphasic    RIGHT LEFT  ABI 0.72 1.03   Right ABIs indicate a moderate reduction in arterial flow with abnormal Doppler waveforms. Left ABIs indicate normal arterial flow however abnormal Doppler waveforms in the dorsalis pedis may suggest a false elevation.  Clementina Mareno, RVS 06/01/2014, 1:18 PM

## 2014-06-02 ENCOUNTER — Encounter (HOSPITAL_COMMUNITY)
Admission: EM | Disposition: A | Discharge status: Skilled Nursing Facility | Payer: Medicare Other | Source: Home / Self Care | Attending: Internal Medicine

## 2014-06-02 ENCOUNTER — Encounter (HOSPITAL_COMMUNITY): Payer: Self-pay | Admitting: Internal Medicine

## 2014-06-02 DIAGNOSIS — Z833 Family history of diabetes mellitus: Secondary | ICD-10-CM | POA: Diagnosis not present

## 2014-06-02 DIAGNOSIS — E1165 Type 2 diabetes mellitus with hyperglycemia: Secondary | ICD-10-CM | POA: Diagnosis present

## 2014-06-02 DIAGNOSIS — E11649 Type 2 diabetes mellitus with hypoglycemia without coma: Secondary | ICD-10-CM | POA: Diagnosis not present

## 2014-06-02 DIAGNOSIS — Z86718 Personal history of other venous thrombosis and embolism: Secondary | ICD-10-CM | POA: Diagnosis not present

## 2014-06-02 DIAGNOSIS — Z7901 Long term (current) use of anticoagulants: Secondary | ICD-10-CM | POA: Diagnosis not present

## 2014-06-02 DIAGNOSIS — I252 Old myocardial infarction: Secondary | ICD-10-CM | POA: Diagnosis not present

## 2014-06-02 DIAGNOSIS — Z955 Presence of coronary angioplasty implant and graft: Secondary | ICD-10-CM | POA: Diagnosis not present

## 2014-06-02 DIAGNOSIS — Z8249 Family history of ischemic heart disease and other diseases of the circulatory system: Secondary | ICD-10-CM | POA: Diagnosis not present

## 2014-06-02 DIAGNOSIS — R4701 Aphasia: Secondary | ICD-10-CM | POA: Diagnosis present

## 2014-06-02 DIAGNOSIS — I5022 Chronic systolic (congestive) heart failure: Secondary | ICD-10-CM | POA: Diagnosis present

## 2014-06-02 DIAGNOSIS — R55 Syncope and collapse: Secondary | ICD-10-CM | POA: Diagnosis present

## 2014-06-02 DIAGNOSIS — Z8674 Personal history of sudden cardiac arrest: Secondary | ICD-10-CM | POA: Diagnosis not present

## 2014-06-02 DIAGNOSIS — E785 Hyperlipidemia, unspecified: Secondary | ICD-10-CM | POA: Diagnosis present

## 2014-06-02 DIAGNOSIS — I2510 Atherosclerotic heart disease of native coronary artery without angina pectoris: Secondary | ICD-10-CM | POA: Diagnosis present

## 2014-06-02 DIAGNOSIS — R001 Bradycardia, unspecified: Secondary | ICD-10-CM | POA: Diagnosis present

## 2014-06-02 DIAGNOSIS — I129 Hypertensive chronic kidney disease with stage 1 through stage 4 chronic kidney disease, or unspecified chronic kidney disease: Secondary | ICD-10-CM | POA: Diagnosis present

## 2014-06-02 DIAGNOSIS — Z8673 Personal history of transient ischemic attack (TIA), and cerebral infarction without residual deficits: Secondary | ICD-10-CM | POA: Diagnosis not present

## 2014-06-02 DIAGNOSIS — I82812 Embolism and thrombosis of superficial veins of left lower extremities: Secondary | ICD-10-CM | POA: Diagnosis present

## 2014-06-02 DIAGNOSIS — D649 Anemia, unspecified: Secondary | ICD-10-CM | POA: Diagnosis present

## 2014-06-02 DIAGNOSIS — F0390 Unspecified dementia without behavioral disturbance: Secondary | ICD-10-CM | POA: Diagnosis present

## 2014-06-02 DIAGNOSIS — F17210 Nicotine dependence, cigarettes, uncomplicated: Secondary | ICD-10-CM | POA: Diagnosis present

## 2014-06-02 DIAGNOSIS — Z86711 Personal history of pulmonary embolism: Secondary | ICD-10-CM | POA: Diagnosis not present

## 2014-06-02 DIAGNOSIS — I255 Ischemic cardiomyopathy: Secondary | ICD-10-CM | POA: Diagnosis present

## 2014-06-02 DIAGNOSIS — N184 Chronic kidney disease, stage 4 (severe): Secondary | ICD-10-CM | POA: Diagnosis present

## 2014-06-02 DIAGNOSIS — Z79899 Other long term (current) drug therapy: Secondary | ICD-10-CM | POA: Diagnosis not present

## 2014-06-02 DIAGNOSIS — K5090 Crohn's disease, unspecified, without complications: Secondary | ICD-10-CM | POA: Diagnosis present

## 2014-06-02 DIAGNOSIS — Z7982 Long term (current) use of aspirin: Secondary | ICD-10-CM | POA: Diagnosis not present

## 2014-06-02 HISTORY — PX: IMPLANTABLE CARDIOVERTER DEFIBRILLATOR IMPLANT: SHX5473

## 2014-06-02 LAB — GLUCOSE, CAPILLARY
GLUCOSE-CAPILLARY: 165 mg/dL — AB (ref 70–99)
GLUCOSE-CAPILLARY: 50 mg/dL — AB (ref 70–99)
GLUCOSE-CAPILLARY: 78 mg/dL (ref 70–99)
GLUCOSE-CAPILLARY: 96 mg/dL (ref 70–99)
GLUCOSE-CAPILLARY: 99 mg/dL (ref 70–99)
Glucose-Capillary: 87 mg/dL (ref 70–99)
Glucose-Capillary: 82 mg/dL (ref 70–99)
Glucose-Capillary: 90 mg/dL (ref 70–99)

## 2014-06-02 LAB — CBC
HCT: 31.7 % — ABNORMAL LOW (ref 39.0–52.0)
HEMOGLOBIN: 10.4 g/dL — AB (ref 13.0–17.0)
MCH: 30.4 pg (ref 26.0–34.0)
MCHC: 32.8 g/dL (ref 30.0–36.0)
MCV: 92.7 fL (ref 78.0–100.0)
PLATELETS: 162 10*3/uL (ref 150–400)
RBC: 3.42 MIL/uL — AB (ref 4.22–5.81)
RDW: 13.8 % (ref 11.5–15.5)
WBC: 6.5 10*3/uL (ref 4.0–10.5)

## 2014-06-02 LAB — BASIC METABOLIC PANEL
ANION GAP: 6 (ref 5–15)
BUN: 14 mg/dL (ref 6–23)
CHLORIDE: 108 mmol/L (ref 96–112)
CO2: 26 mmol/L (ref 19–32)
CREATININE: 1.33 mg/dL (ref 0.50–1.35)
Calcium: 8.7 mg/dL (ref 8.4–10.5)
GFR calc Af Amer: 59 mL/min — ABNORMAL LOW (ref >90)
GFR calc non Af Amer: 51 mL/min — ABNORMAL LOW (ref >90)
Glucose, Bld: 76 mg/dL (ref 70–99)
POTASSIUM: 4.1 mmol/L (ref 3.5–5.1)
SODIUM: 140 mmol/L (ref 135–145)

## 2014-06-02 LAB — URINE CULTURE: Colony Count: 50000

## 2014-06-02 SURGERY — IMPLANTABLE CARDIOVERTER DEFIBRILLATOR IMPLANT
Anesthesia: LOCAL

## 2014-06-02 MED ORDER — DEXTROSE 50 % IV SOLN
25.0000 mL | Freq: Once | INTRAVENOUS | Status: AC
  Administered 2014-06-02: 25 mL via INTRAVENOUS
  Filled 2014-06-02: qty 50

## 2014-06-02 MED ORDER — SODIUM CHLORIDE 0.9 % IV SOLN
INTRAVENOUS | Status: AC
  Administered 2014-06-02: 50 mL/h via INTRAVENOUS

## 2014-06-02 MED ORDER — HEPARIN (PORCINE) IN NACL 2-0.9 UNIT/ML-% IJ SOLN
INTRAMUSCULAR | Status: AC
  Filled 2014-06-02: qty 500

## 2014-06-02 MED ORDER — LIDOCAINE HCL (PF) 1 % IJ SOLN
INTRAMUSCULAR | Status: AC
  Filled 2014-06-02: qty 60

## 2014-06-02 MED ORDER — RIVAROXABAN 15 MG PO TABS
15.0000 mg | ORAL_TABLET | Freq: Two times a day (BID) | ORAL | Status: DC
  Administered 2014-06-02 – 2014-06-04 (×4): 15 mg via ORAL
  Filled 2014-06-02 (×4): qty 1

## 2014-06-02 MED ORDER — RIVAROXABAN 20 MG PO TABS: 20.0000 mg | ORAL_TABLET | Freq: Every day | ORAL | Status: DC

## 2014-06-02 MED ORDER — ACETAMINOPHEN 325 MG PO TABS
325.0000 mg | ORAL_TABLET | ORAL | Status: DC | PRN
  Administered 2014-06-02 – 2014-06-03 (×2): 650 mg via ORAL
  Filled 2014-06-02 (×3): qty 2

## 2014-06-02 MED ORDER — CEFAZOLIN SODIUM 1-5 GM-% IV SOLN
1.0000 g | Freq: Four times a day (QID) | INTRAVENOUS | Status: AC
  Administered 2014-06-02 – 2014-06-03 (×3): 1 g via INTRAVENOUS
  Filled 2014-06-02 (×3): qty 50

## 2014-06-02 MED ORDER — ONDANSETRON HCL 4 MG/2ML IJ SOLN: 4.0000 mg | Freq: Four times a day (QID) | INTRAMUSCULAR | Status: DC | PRN

## 2014-06-02 MED ORDER — MIDAZOLAM HCL 5 MG/5ML IJ SOLN
INTRAMUSCULAR | Status: AC
  Filled 2014-06-02: qty 5

## 2014-06-02 MED ORDER — CEFAZOLIN SODIUM-DEXTROSE 2-3 GM-% IV SOLR
INTRAVENOUS | Status: AC
  Filled 2014-06-02: qty 50

## 2014-06-02 MED ORDER — FENTANYL CITRATE 0.05 MG/ML IJ SOLN
INTRAMUSCULAR | Status: AC
  Filled 2014-06-02: qty 2

## 2014-06-02 NOTE — Progress Notes (Signed)
[facility] PROGRESS NOTE  Grant Lara NKN:[id number] DOB: [birth year] DOA: 05/30/2014 PCP: Lynne Logan, MD  Brief Summary  This is a 74 y.o. 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 He had a witnessed syncopal episode while eating.  Pt was found unconscious by his wife. Pt noted to have been recently admitted 06/7900 for embolic stroke across both hemispheres of the brain w/ lingering bilateral weakness and dysphagia. Pt denies any prodrome prior to sxs. No CP, SOB, nausea, vomiting, abd pain. Pt unaware how long he was unconscious for. Head CT w/ no acute intracranial hemorrhage-with noted multiple chronic infarcts. Patient reported moderate pain in both legs. He underwent a VENOUS duplex and was found to have  Positive partially occluding chronic DVT bilaterally and acute thrombus in the superficial left greater saphenous vein. He is currently on xarelto.   Assessment/Plan  Syncope  -high risk for neurocardiogenic etiology,  he had recent admission for bihemispheric embolic stroke, baseline systolic CHF  -MRI on admission  No acute stroke (subacute from last month) -  2D ECHO:  EF 20-25% with diffuse hypokinesis, no new valvular abnl. Cardiology consulted for further recommendations.  He underwent dual chamber ICD placement on 2/18. Tele monitoring overnight did not reveal any arrhythmias. There were no witnessed seizure activity. -  Orthostatics on 2/16 positive. Repeat orthostatics pending.    2- Embolic stroke -recent admission  -noted VTE failure on coumadin -no new findings on CT or MRI -continue with  xarelto   3-Chronic systolic CHF -EF 40-97% on 2D ECHO 04/2014 -repeat 2D ECHO:  Approximately stable from prior -noted hx/o nonstustained Vtach -no occurrence on  admission -tele:  No significant arrhythmias, just some bradycardia.  -cont BB >> given degree of heart failure, would probably continue beta blocker despite bradycardia unless he became symptomatic  4-CAD  -no active CP  -trop neg  -EKG on admission reviewed,showed sinus bradycardia with PAC'S. Unchanged from previous EKG's.  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  He currently denies any SOB, CP, tachycardia, but reports persistent bilateral leg pain . Venous duplex shows, Chronic DVT and acute superficial thrombus left leg and he is on anti coagulation. His pain is much better today.    Bilateral foot pain -  XR left foot  Ordered and films reviewed did not reveal any fractures.  -  ABI pending.  -  PT/OT assessment recommended home health physical therapy. Maryjo Rochester urine -   CPK is within normal limits. He denies any dysuria symptoms.  UA is slightly abnormal and Urine cultures sent. Currently not on any antibiotics.    Anemia: Normocytic. baseline hemoglobin is around 11. No signs of bleeding. Anemia panel with in normal limits and stool for occult blood pending. He reports having a colonoscopy done in 2012. Repeat cbc in am shows stable hemoglobin. .   FEN/GI: heart healthy,. Prophylaxis: xarelto  Disposition: home possibly tomorrow.  Code Status:Full Code   Consultants:  Cardiology.   EP  Procedures:  CT head  MRI brain  Duplex lower extremity   Echo showed slight worsening of his LVEF to 20%.  Antibiotics:  none   HPI/Subjective:  He reports feeling good.   Objective: Filed Vitals:   06/01/14 2000 06/02/14 0030 06/02/14 0400 06/02/14 0738  BP: 104/56 137/65 138/65 128/62  Pulse: 52 44 57 56  Temp: 98.1 F (36.7 C) 98.2 F (36.8 C) 97.9 F (36.6 C) 98.6 F (37 C)   TempSrc:    Oral  Resp: 16 12 12 17   Height:      Weight:   59.421 kg (131 lb)   SpO2: 100% 100% 99% 100%    Intake/Output Summary (Last 24 hours) at 06/02/14 1413 Last data filed at 06/01/14 2100  Gross per 24 hour  Intake    360 ml  Output    850 ml  Net   -490 ml   Filed Weights   05/31/14 0456 06/01/14 0400 06/02/14 0400  Weight: 58.968 kg (130 lb) 60.464 kg (133 lb 4.8 oz) 59.421 kg (131 lb)    Exam:   General:  Thin M, No acute distress, slow stuttered speech  Cardiovascular:  RRR, nl S1, S2, + gallop,, warm extremities  Respiratory:  CTAB, no increased WOB, no wheezing.   Abdomen:    soft, NT/ND, bowel sounds heard.   MSK:  Trace edema seen., enlarged joints, feet mildly TTP and slightly warm   Neuro:  5/5 symmetric strenght and sensation, no facial droop, slow stuttering speech. No new focal deficits.   Data Reviewed: Basic Metabolic Panel:  Recent Labs Lab 05/30/14 1739 06/01/14 0404 06/02/14 0415  NA 137 139 140  K 4.5 3.9 4.1  CL 105 108 108  CO2 27 28 26   GLUCOSE 255* 80 76  BUN 12 11 14   CREATININE 1.51* 1.48* 1.33  CALCIUM 8.7 8.5 8.7   Liver Function Tests: No results for input(s): AST, ALT, ALKPHOS, BILITOT, PROT, ALBUMIN in the last 168 hours. No results for input(s): LIPASE, AMYLASE in the last 168 hours. No results for input(s): AMMONIA in the last 168 hours. CBC:  Recent Labs Lab 05/30/14 1739 06/01/14 0404 06/02/14 0415  WBC 5.9 6.5 6.5  NEUTROABS 4.4  --   --   HGB 11.7* 10.2* 10.4*  HCT 35.1* 30.9* 31.7*  MCV 90.9 90.6 92.7  PLT 163 161 162   Cardiac Enzymes:  Recent Labs Lab 06/01/14 0404  CKTOTAL 51   BNP (last 3 results) No results for input(s): BNP in the last 8760 hours.  ProBNP (last 3 results) No results for input(s): PROBNP in the last 8760 hours.  CBG:  Recent Labs Lab 06/02/14 0131 06/02/14 0402 06/02/14 0729 06/02/14 0958 06/02/14 1210  GLUCAP 96 87 82 90 99    Recent Results (from the  past 240 hour(s))  Urine culture     Status: None   Collection Time: 05/30/14  6:33 PM  Result Value Ref Range Status   Specimen Description URINE, RANDOM  Final   Special Requests ADDED [id number]  Final   Colony Count   Final    50,000 COLONIES/ML Performed at [facility]    Culture   Final    Multiple bacterial morphotypes present, none predominant. Suggest appropriate recollection if clinically indicated. Performed at [facility]    Report Status 06/02/2014 FINAL  Final     Studies: Dg Foot 2 Views Left  05/31/2014   CLINICAL DATA:  Generalized pain in left foot with no injury, chronic, initial evaluation  EXAM: LEFT FOOT - 2 VIEW  COMPARISON:  None.  FINDINGS: There is no evidence  of fracture or dislocation. There is no evidence of arthropathy or other focal bone abnormality. Soft tissues are unremarkable.  IMPRESSION: Negative.   Electronically Signed   By: Skipper Cliche M.D.   On: 05/31/2014 19:32    Scheduled Meds: . aspirin EC  81 mg Oral Daily  . atorvastatin  10 mg Oral q1800  . carvedilol  3.125 mg Oral BID WC  .  ceFAZolin (ANCEF) IV  1 g Intravenous Q6H  . donepezil  5 mg Oral QHS  . insulin aspart  0-9 Units Subcutaneous 6 times per day  . latanoprost  1 drop Both Eyes QHS  . lisinopril  2.5 mg Oral Daily  . rivaroxaban  15 mg Oral BID WC   Followed by  . [START ON 06/23/2014] rivaroxaban  20 mg Oral Q supper  . sodium chloride  3 mL Intravenous Q12H  . spironolactone  12.5 mg Oral Daily   Continuous Infusions: . sodium chloride 50 mL/hr (06/02/14 1231)    Principal Problem:   Syncope Active Problems:   Essential hypertension, benign   Chronic systolic heart failure   Pulmonary embolism   DM2 (diabetes mellitus, type 2)   Anemia   Stroke   Bradycardia    Time spent: 25 min    [facility] Pager [phone]. If 7PM-7AM, please contact night-coverage at [email or web address], password [facility] 06/02/2014, 2:13 PM

## 2014-06-02 NOTE — Interval H&P Note (Signed)
ICD Criteria  Current LVEF:25% ;Obtained > or = 1 month ago and < or = 3 months ago.  NYHA Functional Classification: Class II  Heart Failure History:  Yes, Duration of heart failure since onset is > 9 months  Non-Ischemic Dilated Cardiomyopathy History:  No.  Atrial Fibrillation/Atrial Flutter:  No.  Ventricular Tachycardia History:  No.  PT has syncope  This is SECONDARY PREVENTION not primary prevention  Cardiac Arrest History:  Yes, It is unknown whether arrest was Ventricular Tachycardia/Ventricular Fibrillation. This was NOT a bradycardia arrest.  History of Syndromes with Risk of Sudden Death:  No.  Previous ICD:  No.  Electrophysiology Study: No.  Prior MI: Yes, Most recent MI timeframe is > 40 days.  PPM: No.  OSA:  No  Patient Life Expectancy of >=1 year: Yes.  Anticoagulation Therapy:  Patient is on anticoagulation therapy, anticoagulation was NOT held prior to procedure.   Beta Blocker Therapy:  Yes.   Ace Inhibitor/ARB Therapy:  Yes.History and Physical Interval Note:  06/02/2014 9:55 AM  Grant Lara  has presented today for surgery, with the diagnosis of syncope  The various methods of treatment have been discussed with the patient and family. After consideration of risks, benefits and other options for treatment, the patient has consented to  Procedure(s): IMPLANTABLE CARDIOVERTER DEFIBRILLATOR IMPLANT (N/A) as a surgical intervention .  The patient's history has been reviewed, patient examined, no change in status, stable for surgery.  I have reviewed the patient's chart and labs.  Questions were answered to the patient's satisfaction.     Virl Axe

## 2014-06-02 NOTE — H&P (View-Only) (Signed)
ELECTROPHYSIOLOGY CONSULT NOTE  Patient ID: KHIAN REMO, MRN: [id number], DOB/AGE: 76/06/1940 76 y.o. Admit date: 05/30/2014 Date of Consult: 06/01/2014  Primary Physician: Lynne Logan, MD Primary Cardiologist: [facility] Chief Complaint: synocpe   HPI Grant Lara is a 74 y.o. male  Was admitted today with an episode of syncope. He was sitting at the chair. His wife heard gurgling sounds. She came in and he was unresponsive. 911 was called and he aroused within 1-2 minutes.  His cardiovascular history is notable for depressed left ventricular function with ejection fraction 20-25% by echo 2/16 which has been persistently depressed since 8/15. Notably 2/14 EF was 40-45% although it was also noted to be 30% in 2005. Last catheterization that I can find is 2005. A nuclear medicine scan 12/10 demonstrated ejection fraction 29% with evidence of a prior infarct without significant ischemia  He has a history of DVT and pulmonary embolism. He previously been on Coumadin. There is a comment in the chart that he "failed" and is now on Rivaroxaban and has been since 8/15. He had a stroke 1/16 which is left with some cognitive impairment. There is also been some weakness and dysphagia. Apparently this involved both hemispheres. It is noted that aspirin has been added to his NOAC because of recurrent thromboembolism/thrombus.     He is noted on telemetry to have heart rates in the 40s. Review of the telemetry from January 2016 demonstrated nonsustained ventricular tachycardia up to 17 beats   Past Medical History  Diagnosis Date  . Hypertension   . Dilated cardiomyopathy     2/12: EF 30-35%, trivial AI, mild RAE.  EF 2014 40 -45%  . CAD (coronary artery disease)     LHC 9/05 with Dr. Einar Gip:  dLM 20-30%, LAD 85%, oD1 20-30%.  PCI:  Taxus DES to LAD; Dx jailed and tx with POBA.  Last myoview 12/10: inf scar, no ischemia, EF 29%.  . DVT (deep venous thrombosis)   . Pulmonary embolus     chronic coumadin   . Hyperhomocystinemia   . PFO (patent foramen ovale)     Not mentioned on 2014 echo.  [name] HLD (hyperlipidemia)   . Crohn's disease   . Nephrolithiasis   . Diabetes mellitus     11/05/11 "borderline; don't take medications"  . Stroke 1993    "left arm can't hold steady; leg too"  . Arthritis     "used to have a touch in my legs"  . Memory difficulties   . Pneumonia ~ 2011    09-22-13 denies any recent SOB or breathing problems  . Swelling of both ankles      09-22-13 occ.feet, but denies pain.  . Bradycardia 06/01/2014      Surgical History:  Past Surgical History  Procedure Laterality Date  . Colon surgery  1994; 1996    "for Crohn's disease"  . Appendectomy       Home Meds: Prior to Admission medications   Medication Sig Start Date End Date Taking? Authorizing Provider  acetaminophen (TYLENOL) 325 MG tablet Take 2 tablets (650 mg total) by mouth every 6 (six) hours as needed for mild pain, moderate pain or fever. 11/23/13   Modena Jansky, MD  aspirin EC 81 MG EC tablet Take 1 tablet (81 mg total) by mouth daily. 04/18/14   Maryann Mikhail, DO  atorvastatin (LIPITOR) 10 MG tablet Take 1 tablet (10 mg total) by mouth daily at 6 PM. 04/18/14   Cristal Ford, DO  carvedilol (COREG) 3.125 MG tablet Take 1 tablet (3.125 mg total) by mouth daily. 02/07/14   Minus Breeding, MD  donepezil (ARICEPT) 5 MG tablet Take 5 mg by mouth at bedtime.    Historical Provider, MD  glucose monitoring kit (FREESTYLE) monitoring kit 1 each by Does not apply route as needed for other. Please give 100 lancets and testing strips.  Check sugars every morning before breakfast and every evening before dinner 08/23/13   Debbe Odea, MD  latanoprost (XALATAN) 0.005 % ophthalmic solution Place 1 drop into both eyes at bedtime. 06/29/13   Historical Provider, MD  lisinopril (PRINIVIL,ZESTRIL) 2.5 MG tablet Take 1 tablet (2.5 mg total) by mouth every morning. 02/07/14   Minus Breeding, MD  rivaroxaban (XARELTO) 20  MG TABS tablet Take 1 tablet (20 mg total) by mouth daily with supper. 05/17/14   Minus Breeding, MD  tamsulosin (FLOMAX) 0.4 MG CAPS capsule Take 1 capsule (0.4 mg total) by mouth daily. 04/18/14   Cristal Ford, DO    Inpatient Medications:  . aspirin EC  81 mg Oral Daily  . atorvastatin  10 mg Oral q1800  . carvedilol  3.125 mg Oral BID WC  . donepezil  5 mg Oral QHS  . insulin aspart  0-9 Units Subcutaneous 6 times per day  . latanoprost  1 drop Both Eyes QHS  . lisinopril  2.5 mg Oral Daily  . rivaroxaban  15 mg Oral BID WC   Followed by  . [START ON 06/23/2014] rivaroxaban  20 mg Oral Q supper  . sodium chloride  3 mL Intravenous Q12H  . spironolactone  12.5 mg Oral Daily     Allergies: No Known Allergies  History   Social History  . Marital Status: Married    Spouse Name: N/A  . Number of Children: N/A  . Years of Education: N/A   Occupational History  . Not on file.   Social History Main Topics  . Smoking status: Current Every Day Smoker -- 0.50 packs/day for 53 years    Types: Cigarettes  . Smokeless tobacco: Never Used  . Alcohol Use: No  . Drug Use: No  . Sexual Activity: Not Currently   Other Topics Concern  . Not on file   Social History Narrative     Family History  Problem Relation Age of Onset  . Diabetes type II Mother   . CAD Father   . Diabetes type II Brother      ROS:  Please see the history of present illness.     All other systems reviewed and negative.    Physical Exam:    Blood pressure 128/68, pulse 50, temperature 98 F (36.7 C), temperature source Oral, resp. rate 16, height _0  (1.753 m), weight 133 lb 4.8 oz (60.464 kg), SpO2 99 %. General: Well developed, well nourished male in no acute distress. Head: Normocephalic, atraumatic, sclera non-icteric, no xanthomas, nares are without discharge. EENT: normal Lymph Nodes:  none Back: without scoliosis/kyphosis , no CVA tendersness Neck: Negative for carotid bruits. JVD not  elevated. Lungs: Clear bilaterally to auscultation without wheezes, rales, or rhonchi. Breathing is unlabored. Heart: RRR with S1 S2.  2/6 systolic murmur , rubs, or gallops appreciated. Abdomen: Soft, non-tender, non-distended with normoactive bowel sounds. No hepatomegaly. No rebound/guarding. No obvious abdominal masses. Msk:  Strength and tone appear normal for age. Extremities: No clubbing or cyanosis. No  edema.  Distal pedal pulses are 2+ and equal bilaterally. Skin: Warm and Dry Neuro:  Alert and oriented X 3. CN III-XII intact  Right sided hemiparesis dysarthria Psych:  Responds to questions appropriately with a normal affect.      Labs: Cardiac Enzymes  Recent Labs  06/01/14 0404  CKTOTAL 51   CBC Lab Results  Component Value Date   WBC 6.5 06/01/2014   HGB 10.2* 06/01/2014   HCT 30.9* 06/01/2014   MCV 90.6 06/01/2014   PLT 161 06/01/2014   PROTIME: No results for input(s): LABPROT, INR in the last 72 hours. Chemistry  Recent Labs Lab 06/01/14 0404  NA 139  K 3.9  CL 108  CO2 28  BUN 11  CREATININE 1.48*  CALCIUM 8.5  GLUCOSE 80   Lipids Lab Results  Component Value Date   CHOL 149 04/16/2014   HDL 48 04/16/2014   LDLCALC 80 04/16/2014   TRIG 104 04/16/2014   BNP PRO B NATRIURETIC PEPTIDE (BNP)  Date/Time Value Ref Range Status  01/31/2011 06:55 AM 275.0* 0 - 125 pg/mL Final   Miscellaneous Lab Results  Component Value Date   DDIMER 5.75* 11/20/2013    Radiology/Studies:  Ct Head Wo Contrast  05/30/2014   CLINICAL DATA:  Weakness  EXAM: CT HEAD WITHOUT CONTRAST  TECHNIQUE: Contiguous axial images were obtained from the base of the skull through the vertex without intravenous contrast.  COMPARISON:  CT 04/15/2014 and MRI same day  FINDINGS: No skull fracture is noted. Paranasal sinuses and mastoid air cells are unremarkable. Atherosclerotic calcifications of carotid siphon again noted.  Stable cerebral atrophy. No intracranial hemorrhage, mass  effect or midline shift. Again noted remote left cerebellar infarct. Stable chronic lacunar infarct in right basal ganglia. Stable chronic infarct in right occipital lobe. There are better visualized chronic appearing infarcts now in left parieto temporal region see axial image 22. Also chronic appearing infarcts in left frontal lobe see axial image 21. No definite acute cortical infarction. Clinical correlation is necessary. No mass lesion is noted on this unenhanced scan.  IMPRESSION: No intracranial hemorrhage, mass effect or midline shift. Again noted multiple chronic infarcts as described above. Normal evolution with chronic appearance of recent infarcts in left frontal lobe and left frontoparietal lobe posteriorly. No definite acute cortical infarction. If there is high clinical suspicious for recurrent infarct further correlation with MRI with diffusion imaging is recommended.   Electronically Signed   By: Lahoma Crocker M.D.   On: 05/30/2014 19:57   Mri Brain Without Contrast  05/30/2014   CLINICAL DATA:  Weakness, possible syncopal episode, found by wife slumped over table, now improving. Dementia. History of hyperlipidemia, diabetes, stroke.  EXAM: MRI HEAD WITHOUT CONTRAST  TECHNIQUE: Multiplanar, multiecho pulse sequences of the brain and surrounding structures were obtained without intravenous contrast.  COMPARISON:  CT of the head May 30, 2014 at 1950 hours and MRI of the brain April 15, 2014  FINDINGS: No reduced diffusion to suggest acute ischemia. Minimal residual reduced diffusion within LEFT parietal and frontal lobes, present on prior imaging, normalized ADC values. The additional tiny acute infarcts from prior examination are no longer apparent by diffusion imaging. No susceptibility artifact to suggest hemorrhage.  Moderate ventriculomegaly, on the basis of global parenchymal brain volume loss, with somewhat disproportionate frontoparietal sulcal enlargement, similar to prior examination.  Bilateral frontal, bilateral parietal and bilateral occipital lobe infarcts present on prior examination. Multiple LEFT greater than RIGHT cerebellar remote infarcts. Scattered additional white matter T2 hyperintensities, exclusive of the gliosis again noted likely representing chronic small vessel ischemic disease. Remote  RIGHT thalamus and bilateral basal ganglia small infarcts.  No midline shift, mass effect or mass lesions. No abnormal extra-axial fluid collections. Normal major intracranial vascular flow voids seen at the skull base.  Mild paranasal sinus mucosal thickening. Mastoid air cells appear well-aerated. No abnormal sellar expansion. No cerebellar tonsillar ectopia. Patient appears edentulous.  IMPRESSION: No acute intracranial process.  Subacute to early chronic LEFT frontoparietal infarcts. Multifocal additional remote infarcts spanning multiple vascular territories suggests embolic disease. Basal ganglia and thalamus remote lacunar infarcts.  Moderate to severe global parenchymal brain volume loss.   Electronically Signed   By: Elon Alas   On: 05/30/2014 22:23   Dg Chest Portable 1 View  05/30/2014   CLINICAL DATA:  Chest pain and shortness of breath today  EXAM: PORTABLE CHEST - 1 VIEW  COMPARISON:  April 15, 2014, CT chest April 18, 2014  FINDINGS: The heart size and mediastinal contours are within normal limits. There is no focal pneumonia, pulmonary edema, or pleural effusion. There is a question 1.9 x 1.1 cm nodule in the right mid to lower lung. This is not definitely appreciated on prior chest x-rays or chest CT. The visualized skeletal structures are stable.  IMPRESSION: No active cardiac disease.  Question 1.9 cm nodule in the right mid to lower lung. This is not definitely appreciated on the prior chest x-ray or chest CT. Recommend further evaluation with a chest CT on outpatient basis.   Electronically Signed   By: Abelardo Diesel M.D.   On: 05/30/2014 18:20   Dg Foot 2 Views  Left  05/31/2014   CLINICAL DATA:  Generalized pain in left foot with no injury, chronic, initial evaluation  EXAM: LEFT FOOT - 2 VIEW  COMPARISON:  None.  FINDINGS: There is no evidence of fracture or dislocation. There is no evidence of arthropathy or other focal bone abnormality. Soft tissues are unremarkable.  IMPRESSION: Negative.   Electronically Signed   By: Skipper Cliche M.D.   On: 05/31/2014 19:32    EKG: sinus 48 15/10/48  Tel VT NS 1/16 hospitalizaiaton   Assessment and Plan:   Syncope   Ischemic cardiomyopathy  StrokeTIA  Pulmonary embolism  Renal insufficiency  Sinus bradycardia  The patient has syncope in the context of ischemic cardiomyopathy. This cohort of patients has an incidence of appropriate ICD shock equivalent to that of VT/VF survivors and ICD implantation is recommended for secondary prevention. This is a class I recommendation. I reviewed this with his wife over the telephone and she would like to proceed along this course. This is true not withstanding the neurological consequences from his strokes. I think that this is a reasonable decision.  In addition, I concur with Dr. Aundra Dubin regarding the need for hematological input related to his coagulation issues and the best strategy for anticoagulation. In most cohort of patients, aspirin added to either NOAC or warfarin, outside of the context of either an ACS in this case is a former or ACS/valve and the case of the latter is associated with higher risk and no demonstrable benefit.  Have reviewed the potential benefits and risks of ICD implantation including but not limited to death, perforation of heart or lung, lead dislodgement, infection,  device malfunction and inappropriate shocks.  The patient and family express understanding  and are willing to proceed.          Virl Axe

## 2014-06-02 NOTE — Progress Notes (Signed)
Hypoglycemic Event  CBG: 50  Treatment: 1/2 amp D50  Symptoms: None   Follow-up CBG: Time:0131 CBG Result:96  Possible Reasons for Event: Patient on Sensitive sliding scale and is NPO   Comments/MD notified:Yes     Burchett, Wilma Flavin  Remember to initiate Hypoglycemia Order Set & complete

## 2014-06-02 NOTE — CV Procedure (Signed)
Grant Lara [id number]  [id number]  Preop YY:QMGNOIB ischemic cardiomyopathy Postop Dx same/  NYHA Class 2  Cx: none apparent    Procedure: dual  chamber ICD implantation without intraoperative defibrillation threshold testing  Following the obtaining of informed consent the patient was brought to the electrophysiology laboratory in place of the fluoroscopic table in the supine position. After routine prep and drape, lidocaine was infiltrated in the prepectoral subclavicular region and an incision was made and carried down to the layer of the prepectoral fascia using electrocautery and sharp dissection. A pocket was formed similarly.  Thereafter  attention was turned to gaining access to the extrathoracic left subclavian vein which was accomplished without  difficulty and without the aspiration of air or puncture of the artery.Two separate venipunctures were accomplished  Sequentially  A 9 French sheath  And 36F sheath were placed through which were   passed a Medtronic MRI compatible  Single coil   active fixation defibrillator lead, model 6935 serial number [id number] and a Medtronic MRI compatible  active fixation atrial lead, serial number [id number] .  They  Were   passed under fluoroscopic guidance to the right ventricular apex and R atrial appendage  respectively.    In its location the bipolar R wave was 18 millivolts, impedance was 527 ohms, the pacing threshold was 0.6 volts at 0.5 msec. Current at threshold was 1.0 mA.  There was no diaphragmatic pacing at 10 V. The current of injury was MINIMAL.  The bipolar P wave was 6 millivolts, impedance was 1045 ohms, the pacing threshold was 1 volts at 0.5 msec. Current at threshold was 1.0 mA.  There was no diaphragmatic pacing at 10 V. The current of injury was brisk .   The leads were secured to the prepectoral fascia and then attached to a Medtronic MRI compatible  ICD, serial number  [id number].  Through the device, the bipolar R wave was 3  millivolts, impedance was 589 ohms, the pacing threshold was 1.75 volts at 0.4 msec.  The bipolar P wave was 20 millivolts, impedance was 456 ohms, the pacing threshold was 0.75 volts at 0.4 msec. High-voltage impedance was  68 ohms.      The pocket was copiously irrigated with antibiotic containing saline solution. Hemostasis was assured, and the device and the leads were placed in the pocket and secured to the prepectoral fascia.  The wound was closed in 2 layers in normal fashion. The wound was washed dried and a DERMABOND dressing was then applied. Needle counts, sponge counts and instrument counts were correct at the end of the procedure according to the staff.  EBL minimal

## 2014-06-02 NOTE — Care Management Note (Addendum)
    Page 1 of 1   06/03/2014     4:27:55 PM CARE MANAGEMENT NOTE 06/03/2014  Patient:  Grant Lara, Grant Lara   Account Number:  [id number]  Date Initiated:  06/02/2014  Documentation initiated by:  GRAVES-BIGELOW,BRENDA  Subjective/Objective Assessment:   Pt admitted for chest pain and syncope. Pt is from home and active with [facility] , PT/OT.     Action/Plan:   Resumption orders placed in the system. No further needs identified by CM at this time.   Anticipated DC Date:  06/03/2014   Anticipated DC Plan:  [place]  CM consult      [facility] Choice  Resumption Of Svcs/PTA Zofia Peckinpaugh  HOME HEALTH   Choice offered to / List presented to:  C-1 Patient        [place] arranged  HH-1 RN  [place]   Status of service:  Completed, signed off Medicare Important Message given?  YES (If response is "NO", the following Medicare IM given date fields will be blank) Date Medicare IM given:  06/02/2014 Medicare IM given by:  GRAVES-BIGELOW,BRENDA Date Additional Medicare IM given:   Additional Medicare IM given by:    Discharge Disposition:  [place]  Per UR Regulation:  Reviewed for med. necessity/level of care/duration of stay  If discussed at [place] of Stay Meetings, dates discussed:    Comments:  06/02/2014 @ [place] RN,BSN SNF recommended per PT. Referral made to CSW. No other needs identified per CM.

## 2014-06-03 ENCOUNTER — Telehealth: Payer: Self-pay | Admitting: Oncology

## 2014-06-03 ENCOUNTER — Inpatient Hospital Stay (HOSPITAL_COMMUNITY): Payer: Medicare Other

## 2014-06-03 DIAGNOSIS — I255 Ischemic cardiomyopathy: Secondary | ICD-10-CM

## 2014-06-03 LAB — URINE MICROSCOPIC-ADD ON

## 2014-06-03 LAB — GLUCOSE, CAPILLARY
GLUCOSE-CAPILLARY: 130 mg/dL — AB (ref 70–99)
GLUCOSE-CAPILLARY: 137 mg/dL — AB (ref 70–99)
GLUCOSE-CAPILLARY: 154 mg/dL — AB (ref 70–99)
GLUCOSE-CAPILLARY: 76 mg/dL (ref 70–99)
Glucose-Capillary: 65 mg/dL — ABNORMAL LOW (ref 70–99)
Glucose-Capillary: 96 mg/dL (ref 70–99)
Glucose-Capillary: 113 mg/dL — ABNORMAL HIGH (ref 70–99)

## 2014-06-03 LAB — URINALYSIS, ROUTINE W REFLEX MICROSCOPIC
BILIRUBIN URINE: NEGATIVE
Glucose, UA: NEGATIVE mg/dL
Ketones, ur: NEGATIVE mg/dL
Leukocytes, UA: NEGATIVE
NITRITE: NEGATIVE
Protein, ur: 30 mg/dL — AB
Specific Gravity, Urine: 1.014 (ref 1.005–1.030)
UROBILINOGEN UA: 0.2 mg/dL (ref 0.0–1.0)
pH: 6 (ref 5.0–8.0)

## 2014-06-03 LAB — BASIC METABOLIC PANEL
Anion gap: 5 (ref 5–15)
BUN: 12 mg/dL (ref 6–23)
CALCIUM: 8.4 mg/dL (ref 8.4–10.5)
CO2: 27 mmol/L (ref 19–32)
CREATININE: 1.39 mg/dL — AB (ref 0.50–1.35)
Chloride: 105 mmol/L (ref 96–112)
GFR calc Af Amer: 56 mL/min — ABNORMAL LOW (ref >90)
GFR calc non Af Amer: 48 mL/min — ABNORMAL LOW (ref >90)
GLUCOSE: 108 mg/dL — AB (ref 70–99)
Potassium: 4.3 mmol/L (ref 3.5–5.1)
Sodium: 137 mmol/L (ref 135–145)

## 2014-06-03 LAB — CBC
HEMATOCRIT: 31.9 % — AB (ref 39.0–52.0)
HEMOGLOBIN: 10.5 g/dL — AB (ref 13.0–17.0)
MCH: 30.1 pg (ref 26.0–34.0)
MCHC: 32.9 g/dL (ref 30.0–36.0)
MCV: 91.4 fL (ref 78.0–100.0)
Platelets: 149 10*3/uL — ABNORMAL LOW (ref 150–400)
RBC: 3.49 MIL/uL — ABNORMAL LOW (ref 4.22–5.81)
RDW: 13.9 % (ref 11.5–15.5)
WBC: 7 10*3/uL (ref 4.0–10.5)

## 2014-06-03 MED ORDER — OXYCODONE HCL 5 MG PO TABS
5.0000 mg | ORAL_TABLET | Freq: Four times a day (QID) | ORAL | Status: AC | PRN
  Administered 2014-06-03 – 2014-06-04 (×4): 5 mg via ORAL
  Filled 2014-06-03 (×4): qty 1

## 2014-06-03 MED ORDER — TRAMADOL HCL 50 MG PO TABS
50.0000 mg | ORAL_TABLET | Freq: Four times a day (QID) | ORAL | Status: DC | PRN
  Administered 2014-06-03: 50 mg via ORAL
  Filled 2014-06-03: qty 1

## 2014-06-03 NOTE — Progress Notes (Signed)
[facility] PROGRESS NOTE  Grant Lara QQV:[id number] DOB: Sep 08, 1940 DOA: 05/30/2014 PCP: Lynne Logan, MD  Brief Summary  This is a 74 y.o. 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 He had a witnessed syncopal episode while eating.  Pt was found unconscious by his wife. Pt noted to have been recently admitted 06/3293 for embolic stroke across both hemispheres of the brain w/ lingering bilateral weakness and dysphagia. Pt denies any prodrome prior to sxs. No CP, SOB, nausea, vomiting, abd pain. Pt unaware how long he was unconscious for. Head CT w/ no acute intracranial hemorrhage-with noted multiple chronic infarcts. Patient reported moderate pain in both legs. He underwent a VENOUS duplex and was found to have  Positive partially occluding chronic DVT bilaterally and acute thrombus in the superficial left greater saphenous vein. He is currently on xarelto.   Assessment/Plan  Syncope  -high risk for neurocardiogenic etiology,  he had recent admission for bihemispheric embolic stroke, baseline systolic CHF  -MRI on admission  No acute stroke (subacute from last month) -  2D ECHO:  EF 20-25% with diffuse hypokinesis, no new valvular abnl. Cardiology consulted for further recommendations.  He underwent dual chamber ICD placement on 2/18. Tele monitoring overnight did not reveal any arrhythmias. There were no witnessed seizure activity. -  Orthostatics on 2/16 positive. Repeat orthostatics pending.    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  -noted hx/o nonstustained Vtach -cont BB >> given degree of  heart failure, would probably continue beta blocker despite bradycardia unless he became symptomatic  4-CAD  -no active CP  -trop neg  -EKG on admission reviewed,showed sinus bradycardia with PAC'S. Unchanged from previous EKG's.  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  He currently denies any SOB, CP, tachycardia, but reports persistent bilateral leg pain . Venous duplex shows, Chronic DVT and acute superficial thrombus left leg and he is on anti coagulation. His pain is much better today.    Bilateral foot pain -  XR left foot  Ordered and films reviewed did not reveal any fractures.  -  ABI pending.  -  PT/OT assessment recommended home health physical therapy. Maryjo Rochester urine -   CPK is within normal limits. He denies any dysuria symptoms.  UA is slightly abnormal and Urine cultures sent. Currently not on any antibiotics.    Anemia: Normocytic. baseline hemoglobin is around 11. No signs of bleeding. Anemia panel with in normal limits and stool for occult blood pending. He reports having a colonoscopy done in 2012. Repeat cbc in am shows stable hemoglobin. .    Hypoglycemia:  Poor po intake and recommended liberal diet . Continue to check kCBG.S     Prophylaxis: xarelto  Disposition: home possibly tomorrow.  Code Status:Full Code   Consultants:  Cardiology.   EP  Procedures:  CT head  MRI brain  Duplex lower extremity   Echo showed slight worsening of his LVEF  to 20%.   Antibiotics:  none   HPI/Subjective:  No new complaints.   Objective: Filed Vitals:   06/03/14 0755 06/03/14 0845 06/03/14 1043 06/03/14 1200  BP: 118/63 117/53 119/62 108/71  Pulse: 83 84  71  Temp: 99.4 F (37.4 C)   99.2 F (37.3 C)  TempSrc: Oral   Oral  Resp: 16   22  Height:      Weight:      SpO2:  95%   96%    Intake/Output Summary (Last 24 hours) at 06/03/14 1644 Last data filed at 06/03/14 1620  Gross per 24 hour  Intake    843 ml  Output   2125 ml  Net  -1282 ml   Filed Weights   05/31/14 0456 06/01/14 0400 06/02/14 0400  Weight: 58.968 kg (130 lb) 60.464 kg (133 lb 4.8 oz) 59.421 kg (131 lb)    Exam:   General:  Thin M, No acute distress, slow stuttered speech  Cardiovascular:  RRR, nl S1, S2, + gallop,, warm extremities  Respiratory:  CTAB, no increased WOB, no wheezing. No rhonchi.   Abdomen:    soft, NT/ND, bowel sounds heard.   MSK:  Trace edema seen., enlarged joints, feet mildly TTP and slightly warm   Neuro:  5/5 symmetric strenght and sensation, no facial droop, slow stuttering speech. No new focal deficits.   Data Reviewed: Basic Metabolic Panel:  Recent Labs Lab 05/30/14 1739 06/01/14 0404 06/02/14 0415 06/03/14 0655  NA 137 139 140 137  K 4.5 3.9 4.1 4.3  CL 105 108 108 105  CO2 27 28 26 27   GLUCOSE 255* 80 76 108*  BUN 12 11 14 12   CREATININE 1.51* 1.48* 1.33 1.39*  CALCIUM 8.7 8.5 8.7 8.4   Liver Function Tests: No results for input(s): AST, ALT, ALKPHOS, BILITOT, PROT, ALBUMIN in the last 168 hours. No results for input(s): LIPASE, AMYLASE in the last 168 hours. No results for input(s): AMMONIA in the last 168 hours. CBC:  Recent Labs Lab 05/30/14 1739 06/01/14 0404 06/02/14 0415 06/03/14 0655  WBC 5.9 6.5 6.5 7.0  NEUTROABS 4.4  --   --   --   HGB 11.7* 10.2* 10.4* 10.5*  HCT 35.1* 30.9* 31.7* 31.9*  MCV 90.9 90.6 92.7 91.4  PLT 163 161 162 149*   Cardiac Enzymes:  Recent Labs Lab 06/01/14 0404  CKTOTAL 51   BNP (last 3 results) No results for input(s): BNP in the last 8760 hours.  ProBNP (last 3 results) No results for input(s): PROBNP in the last 8760 hours.  CBG:  Recent Labs Lab 06/03/14 0027 06/03/14 0425 06/03/14 0519 06/03/14 0754 06/03/14 1134  GLUCAP 130* 65* 96 113* 154*    Recent Results  (from the past 240 hour(s))  Urine culture     Status: None   Collection Time: 05/30/14  6:33 PM  Result Value Ref Range Status   Specimen Description URINE, RANDOM  Final   Special Requests ADDED [id number]  Final   Colony Count   Final    50,000 COLONIES/ML Performed at [facility]    Culture   Final    Multiple bacterial morphotypes present, none predominant. Suggest appropriate recollection if clinically indicated. Performed at [facility]    Report Status 06/02/2014 FINAL  Final     Studies: Dg Chest 2 View  06/03/2014   CLINICAL DATA:  Syncope. Ischemic cardiomyopathy. Insertion of AICD.  EXAM: CHEST  2 VIEW  COMPARISON:  05/30/2014 and 11/20/2013  FINDINGS: AICD has been inserted. Heart size and vascularity are normal. No pneumothorax. Lungs are clear except for a small area of scarring in the posterior aspect of the right lower lobe.  IMPRESSION: AICD now in place.  No acute abnormalities.   Electronically Signed   By: Lorriane Shire M.D.   On: 06/03/2014 07:34    Scheduled Meds: . aspirin EC  81 mg Oral Daily  . atorvastatin  10 mg Oral q1800  . carvedilol  3.125 mg Oral BID WC  . donepezil  5 mg Oral QHS  . insulin aspart  0-9 Units Subcutaneous 6 times per day  . latanoprost  1 drop Both Eyes QHS  . lisinopril  2.5 mg Oral Daily  . rivaroxaban  15 mg Oral BID WC   Followed by  . [START ON 06/23/2014] rivaroxaban  20 mg Oral Q supper  . sodium chloride  3 mL Intravenous Q12H  . spironolactone  12.5 mg Oral Daily   Continuous Infusions:    Principal Problem:   Syncope Active Problems:   Essential hypertension, benign   Chronic systolic heart failure   Pulmonary embolism   DM2 (diabetes mellitus, type 2)   Anemia   Stroke   Bradycardia    Time spent: 15 min    [facility] Pager [phone]. If 7PM-7AM, please contact night-coverage at [email or web address], password [facility] 06/03/2014, 4:44 PM  LOS: 1 day

## 2014-06-03 NOTE — Progress Notes (Signed)
Occupational Therapy Treatment Patient Details Name: Grant Lara MRN: [id number] DOB: 05-Sep-1940 Today's Date: 06/03/2014    History of present illness Pt is a 74 y.o. 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 Pt is now s/p dualchamber ICD implantation on 06/02/14.    OT comments  Pt seen today for ADLs and functional mobility. Pt is now s/p ICD implant and requires increased assistance for ADLs. Pt also requiring min A for sit<>stand. Concerned about pt's new status and feel that safest d/c venue would be SNF at this time. Recommendations updated. Acute OT will continue to follow per POC.    Follow Up Recommendations  SNF;Supervision/Assistance - 24 hour    Equipment Recommendations  None recommended by OT    Recommendations for Other Services      Precautions / Restrictions Precautions Precautions: Fall Restrictions Weight Bearing Restrictions: No       Mobility Bed Mobility Overal bed mobility: Needs Assistance Bed Mobility: Supine to Sit     Supine to sit: Min assist;HOB elevated     General bed mobility comments: Pt required hand held assist to pull himself forward to EOB. Pt was able to advance LEs to EOB but c/o stiffness.   Transfers Overall transfer level: Needs assistance Equipment used: Rolling walker (2 wheeled) Transfers: Sit to/from [facility] Sit to Stand: Min assist Stand pivot transfers: Min assist       General transfer comment: Pt required min assist to power up due to LE weakness and pain.    Balance Overall balance assessment: Needs assistance Sitting-balance support: No upper extremity supported;Feet supported Sitting balance-Leahy Scale: Fair     Standing balance support: Bilateral upper extremity supported;During functional  activity Standing balance-Leahy Scale: Poor Standing balance comment: Pt required UE support on RW for balance.                    ADL Overall ADL's : Needs assistance/impaired     Grooming: Set up;Sitting   Upper Body Bathing: Set up;Sitting   Lower Body Bathing: Sit to/from stand;Maximal assistance   Upper Body Dressing : Minimal assistance;Sitting   Lower Body Dressing: Total assistance;Sit to/from stand   Toilet Transfer: Minimal assistance;Stand-pivot;RW Armed forces technical officer Details (indicate cue type and reason): bed>recliner Toileting- Clothing Manipulation and Hygiene: Sit to/from stand;Maximal assistance         General ADL Comments: Pt c/o pain in legs with movement. Pt required encouragement to get OOB. Pt was irritated to get up, but did proceed. Performed SPT to recliner and sat pt in front of sink for wash up.                 Cognition  Arousal/Alertness: Awake/Alert Behavior During Therapy: Flat affect (irritated) Overall Cognitive Status: No family/caregiver present to determine baseline cognitive functioning Area of Impairment: Safety/judgement;Awareness;Problem solving;Memory     Memory: Decreased short-term memory    Safety/Judgement: Decreased awareness of safety;Decreased awareness of deficits Awareness: Emergent Problem Solving: Slow processing;Requires verbal cues                   Pertinent Vitals/ Pain       Pain Assessment: Faces Faces Pain Scale: Hurts even more Pain Location: Bil LEs Pain Descriptors / Indicators: Aching;Grimacing ("stiff") Pain Intervention(s): Limited activity within patient's tolerance;Monitored during session;Repositioned  Frequency Min 2X/week     Progress Toward Goals  OT Goals(current goals can now be found in the care plan section)  Progress towards OT goals: Not progressing toward goals - comment (due to recent surgery (ICD implant))  Acute Rehab OT Goals Patient Stated Goal: to get  back home and out of here OT Goal Formulation: With patient Time For Goal Achievement: 06/15/14 Potential to Achieve Goals: Good  Plan Discharge plan needs to be updated       End of Session Equipment Utilized During Treatment: Gait belt;Rolling walker   Activity Tolerance Patient tolerated treatment well   Patient Left in chair;with call bell/phone within reach   Nurse Communication Mobility status        Time: 1152-1225 OT Time Calculation (min): 33 min  Charges: OT General Charges $OT Visit: 1 Procedure OT Treatments $Self Care/Home Management : 23-37 mins  Juluis Rainier 06/03/2014, 1:26 PM  Cyndie Chime, OTR/L Occupational Therapist [phone] (pager)

## 2014-06-03 NOTE — Progress Notes (Signed)
Patient Name: Grant Lara First      SUBJECTIVE:*without complaints  Past Medical History  Diagnosis Date  . Hypertension   . Dilated cardiomyopathy     2/12: EF 30-35%, trivial AI, mild RAE.  EF 2014 40 -45%  . CAD (coronary artery disease)     LHC 9/05 with Dr. Einar Gip:  dLM 20-30%, LAD 85%, oD1 20-30%.  PCI:  Taxus DES to LAD; Dx jailed and tx with POBA.  Last myoview 12/10: inf scar, no ischemia, EF 29%.  . DVT (deep venous thrombosis)   . Pulmonary embolus     chronic coumadin  . Hyperhomocystinemia   . PFO (patent foramen ovale)     Not mentioned on 2014 echo.  [name] HLD (hyperlipidemia)   . Crohn's disease   . Nephrolithiasis   . Diabetes mellitus     11/05/11 "borderline; don't take medications"  . Stroke 1993    "left arm can't hold steady; leg too"  . Arthritis     "used to have a touch in my legs"  . Memory difficulties   . Pneumonia ~ 2011    09-22-13 denies any recent SOB or breathing problems  . Swelling of both ankles      09-22-13 occ.feet, but denies pain.  . Bradycardia 06/01/2014    Scheduled Meds:  Scheduled Meds: Continuous Infusions:   VS recorded on other notes PHYSICAL EXAM  Well developed and nourished in no acute distress HENT normal Neck supple  pockete  without heamtooma Clear Regular rate and rhythm, no murmurs or gallops Abd-soft with active BS No Clubbing cyanosis edema Skin-warm and dry A & Oriented  Grossly normal sensory and motor function   TELEMETRY: Reviewed telemetry pt in NSR   No intake or output data in the 24 hours ending 06/03/14 1818  LABS: Basic Metabolic Panel:  Recent Labs Lab 05/30/14 1739 06/01/14 0404 06/02/14 0415 06/03/14 0655  NA 137 139 140 137  K 4.5 3.9 4.1 4.3  CL 105 108 108 105  CO2 27 28 26 27   GLUCOSE 255* 80 76 108*  BUN 12 11 14 12   CREATININE 1.51* 1.48* 1.33 1.39*  CALCIUM 8.7 8.5 8.7 8.4   Cardiac Enzymes:  Recent Labs  06/01/14 0404  CKTOTAL 51   CBC:  Recent  Labs Lab 05/30/14 1739 06/01/14 0404 06/02/14 0415 06/03/14 0655  WBC 5.9 6.5 6.5 7.0  NEUTROABS 4.4  --   --   --   HGB 11.7* 10.2* 10.4* 10.5*  HCT 35.1* 30.9* 31.7* 31.9*  MCV 90.9 90.6 92.7 91.4  PLT 163 161 162 149*   PROTIME: No results for input(s): LABPROT, INR in the last 72 hours. Liver Function Tests: No results for input(s): AST, ALT, ALKPHOS, BILITOT, PROT, ALBUMIN in the last 72 hours. No results for input(s): LIPASE, AMYLASE in the last 72 hours. BNP: BNP (last 3 results) No results for input(s): BNP in the last 8760 hours.  ProBNP (last 3 results) No results for input(s): PROBNP in the last 8760 hours.  D-Dimer: No results for input(s): DDIMER in the last 72 hours. Hemoglobin A1C: No results for input(s): HGBA1C in the last 72 hours. Fasting Lipid Panel: No results for input(s): CHOL, HDL, LDLCALC, TRIG, CHOLHDL, LDLDIRECT in the last 72 hours. Thyroid Function Tests: No results for input(s): TSH, T4TOTAL, T3FREE, THYROIDAB in the last 72 hours.  Invalid input(s): FREET3 Anemia Panel:  Recent Labs  06/01/14 1300  VITAMINB12 494  FOLATE 8.6  FERRITIN 40  TIBC 251  IRON 49  RETICCTPCT 1.5     Device Interrogation normla device function  CXR stable leads   ASSESSMENT AND PLAN:  ischemicmi caerdiomyopahty  Syncope  Implantable defib with normla funciton  Signed, Virl Axe MD  06/03/2014

## 2014-06-03 NOTE — Progress Notes (Signed)
Hypoglycemic Event  CBG: 65  Treatment: 15 GM carbohydrate snack  Symptoms: Nervous/irritable  Follow-up CBG: Time:5:24 CBG Result:96  Possible Reasons for Event: Medication regimen: insulin coverage q4hr   Comments/MD notified: patient is stable and recovered quickly.     Grant Lara A  Remember to initiate Hypoglycemia Order Set & complete

## 2014-06-03 NOTE — Discharge Instructions (Signed)
**  PLEASE REMEMBER TO BRING ALL OF YOUR MEDICATIONS TO EACH OF YOUR FOLLOW-UP OFFICE VISITS.     Supplemental Discharge Instructions for  Pacemaker/Defibrillator Patients  Activity No heavy lifting or vigorous activity with your left/right arm for 6 to 8 weeks.  Do not raise your left/right arm above your head for one week.  Gradually raise your affected arm as drawn below.           __     2.24                /        2.25          /       2.26            /         2.27        NO DRIVING for     ; you may begin driving on   5.39  .  WOUND CARE - Keep the wound area clean and dry.  Do not get this area wet for one week. No showers for one week; you may shower on  2.27   . - The tape/steri-strips on your wound will fall off; do not pull them off.  No bandage is needed on the site.  DO  NOT apply any creams, oils, or ointments to the wound area. - If you notice any drainage or discharge from the wound, any swelling or bruising at the site, or you develop a fever > 101? F after you are discharged home, call the office at once.  Special Instructions - You are still able to use cellular telephones; use the ear opposite the side where you have your pacemaker/defibrillator.  Avoid carrying your cellular phone near your device. - When traveling through airports, show security personnel your identification card to avoid being screened in the metal detectors.  Ask the security personnel to use the hand wand. - Avoid arc welding equipment, MRI testing (magnetic resonance imaging), TENS units (transcutaneous nerve stimulators).  Call the office for questions about other devices. - Avoid electrical appliances that are in poor condition or are not properly grounded. - Microwave ovens are safe to be near or to operate.  Additional information for defibrillator patients should your device go off: - If your device goes off ONCE and you feel fine afterward, notify the device clinic nurses. - If your  device goes off ONCE and you do not feel well afterward, call 911. - If your device goes off TWICE, call 911. - If your device goes off THREE times in one day, call 911.  DO NOT DRIVE YOURSELF OR A FAMILY MEMBER WITH A DEFIBRILLATOR TO THE HOSPITAL--CALL 911.

## 2014-06-03 NOTE — Telephone Encounter (Signed)
S/W PT'S WIFE IN REF TO NP APPT. ON 06/22/14@10 :[place] DX-DVT

## 2014-06-03 NOTE — Progress Notes (Signed)
Physical Therapy Treatment Patient Details Name: Grant Lara MRN: [id number] DOB: 1940-05-03 Today's Date: 06/03/2014    History of Present Illness Pt is a 74 y.o. 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 Pt is now s/p dualchamber ICD implantation on 06/02/14.     PT Comments    Patient with decline in mobility, with increased pain in LE's.  Patient unable to ambulate more than 2' today with mod assist.  Feel patient will need increased assistance at discharge.  Recommend SNF at discharge for continued therapy.  Contacted CM who called CSW.   Follow Up Recommendations  SNF;Supervision/Assistance - 24 hour     Equipment Recommendations  Wheelchair (measurements PT);Wheelchair cushion (measurements PT)    Recommendations for Other Services       Precautions / Restrictions Precautions Precautions: Fall Restrictions Weight Bearing Restrictions: No    Mobility  Bed Mobility Overal bed mobility: Needs Assistance Bed Mobility: Supine to Sit;Sit to Supine     Supine to sit: Min assist;HOB elevated Sit to supine: Mod assist;HOB elevated   General bed mobility comments: Verbal cues for technique and to avoid use of LUE.  Patient required increased time to get to sitting position with LE's off of bed.  Assist to scoot to EOB with use of bed pads.  Patient continued to c/o BLE pain with movement.  Transfers Overall transfer level: Needs assistance Equipment used: 1 person hand held assist Transfers: Sit to/from Stand Sit to Stand: Min assist Stand pivot transfers: Min assist       General transfer comment: Min assist to rise to standing and for balance.  In static standing, patient with posterior lean, requiring min assist to maintain balance.  Patient declined use of assistive  device for balance.  Ambulation/Gait Ambulation/Gait assistance: Mod assist Ambulation Distance (Feet): 2 Feet Assistive device: 1 person hand held assist Gait Pattern/deviations: Decreased step length - right;Decreased step length - left;Shuffle;Antalgic;Trunk flexed Gait velocity: slower Gait velocity interpretation: Below normal speed for age/gender General Gait Details: Patient reaching for tray table with initiation of gait.  Provided patient with hand-held assist.  Patient took 2 steps and abruptly sat on bed.  Patient reports he cannot walk today due to pain in LE's.  Declined use of assistive device.   Stairs            Wheelchair Mobility    Modified Rankin (Stroke Patients Only)       Balance Overall balance assessment: Needs assistance Sitting-balance support: No upper extremity supported;Feet supported Sitting balance-Leahy Scale: Fair     Standing balance support: Single extremity supported Standing balance-Leahy Scale: Poor Standing balance comment: Requiring assist to maintain balance with any dynamic activity.                    Cognition Arousal/Alertness: Awake/alert Behavior During Therapy: Flat affect;Agitated Overall Cognitive Status: No family/caregiver present to determine baseline cognitive functioning Area of Impairment: Safety/judgement;Awareness;Problem solving;Memory     Memory: Decreased short-term memory   Safety/Judgement: Decreased awareness of safety;Decreased awareness of deficits Awareness: Emergent Problem Solving: Slow processing;Decreased initiation;Difficulty sequencing;Requires verbal cues;Requires tactile cues      Exercises      General Comments        Pertinent Vitals/Pain Pain Assessment: Faces Faces Pain Scale: Hurts whole lot (patient states "I'm stiff")  Pain Location: BLE's Pain Descriptors / Indicators: Aching;Grimacing Pain Intervention(s): Limited activity within patient's tolerance;Repositioned     Home Living                      Prior Function            PT Goals (current goals can now be found in the care plan section) Acute Rehab PT Goals Patient Stated Goal: to get back home and out of here Progress towards PT goals: Not progressing toward goals - comment (Due to pain in LE's impacting mobility/gait)    Frequency  Min 3X/week    PT Plan Discharge plan needs to be updated    Co-evaluation             End of Session Equipment Utilized During Treatment: Gait belt Activity Tolerance: Patient limited by pain;Patient limited by fatigue Patient left: in bed;with call bell/phone within reach;with bed alarm set     Time: 1559-1620 PT Time Calculation (min) (ACUTE ONLY): 21 min  Charges:  $Therapeutic Activity: 8-22 mins                    G Codes:      Despina Pole 06-26-14, 4:59 PM Carita Pian. Sanjuana Kava, [place] Pager [phone]

## 2014-06-04 ENCOUNTER — Inpatient Hospital Stay (HOSPITAL_COMMUNITY): Payer: Medicare Other

## 2014-06-04 LAB — CBC
HCT: 31.6 % — ABNORMAL LOW (ref 39.0–52.0)
Hemoglobin: 10.4 g/dL — ABNORMAL LOW (ref 13.0–17.0)
MCH: 29.8 pg (ref 26.0–34.0)
MCHC: 32.9 g/dL (ref 30.0–36.0)
MCV: 90.5 fL (ref 78.0–100.0)
Platelets: 137 10*3/uL — ABNORMAL LOW (ref 150–400)
RBC: 3.49 MIL/uL — AB (ref 4.22–5.81)
RDW: 13.9 % (ref 11.5–15.5)
WBC: 8 10*3/uL (ref 4.0–10.5)

## 2014-06-04 LAB — GLUCOSE, CAPILLARY
GLUCOSE-CAPILLARY: 107 mg/dL — AB (ref 70–99)
GLUCOSE-CAPILLARY: 143 mg/dL — AB (ref 70–99)
Glucose-Capillary: 117 mg/dL — ABNORMAL HIGH (ref 70–99)
Glucose-Capillary: 122 mg/dL — ABNORMAL HIGH (ref 70–99)

## 2014-06-04 LAB — BASIC METABOLIC PANEL
ANION GAP: 7 (ref 5–15)
BUN: 12 mg/dL (ref 6–23)
CO2: 27 mmol/L (ref 19–32)
CREATININE: 1.39 mg/dL — AB (ref 0.50–1.35)
Calcium: 8.5 mg/dL (ref 8.4–10.5)
Chloride: 103 mmol/L (ref 96–112)
GFR calc Af Amer: 56 mL/min — ABNORMAL LOW (ref >90)
GFR calc non Af Amer: 48 mL/min — ABNORMAL LOW (ref >90)
GLUCOSE: 110 mg/dL — AB (ref 70–99)
Potassium: 4.1 mmol/L (ref 3.5–5.1)
SODIUM: 137 mmol/L (ref 135–145)

## 2014-06-04 MED ORDER — RIVAROXABAN 15 MG PO TABS: 15.0000 mg | ORAL_TABLET | Freq: Two times a day (BID) | ORAL | Status: DC

## 2014-06-04 MED ORDER — SPIRONOLACTONE 25 MG PO TABS: 12.5000 mg | ORAL_TABLET | Freq: Every day | ORAL | Status: DC

## 2014-06-04 MED ORDER — INSULIN ASPART 100 UNIT/ML ~~LOC~~ SOLN: SUBCUTANEOUS | Status: DC

## 2014-06-04 MED ORDER — RIVAROXABAN 20 MG PO TABS: 20.0000 mg | ORAL_TABLET | Freq: Every day | ORAL | Status: DC

## 2014-06-04 NOTE — Progress Notes (Addendum)
Clinical Social Work Department CLINICAL SOCIAL WORK PLACEMENT NOTE 06/04/2014  Patient:  Grant Lara, Grant Lara  Account Number:  [id number] Admit date:  03/21/2009  Clinical Social Worker:  Adair Laundry  Date/time:  06/04/2014 11:41 AM  Clinical Social Work is seeking post-discharge placement for this patient at the following level of care:   SKILLED NURSING   (*CSW will update this form in Epic as items are completed)   06/04/2014  Patient/family provided with [facility] [facility] Department of Clinical Social Work's list of facilities offering this level of care within the geographic area requested by the patient (or if unable, by the patient's family).  06/04/2014  Patient/family informed of their freedom to choose among providers that offer the needed level of care, that participate in Medicare, Medicaid or managed care program needed by the patient, have an available bed and are willing to accept the patient.  06/04/2014  Patient/family informed of [facility]' ownership interest in [facility], as well as of the fact that they are under no obligation to receive care at this facility.  PASARR submitted to EDS on existing PASARR number received on   FL2 transmitted to all facilities in geographic area requested by pt/family on  06/04/2014 FL2 transmitted to all facilities within larger geographic area on   Patient informed that his/her managed care company has contracts with or will negotiate with  certain facilities, including the following:     Patient/family informed of bed offers received:  06/04/2014 Patient chooses bed at [facility] Physician recommends and patient chooses bed at    Patient to be transferred to [facility] on  06/04/2014 Patient to be transferred to facility by [facility] Patient and family notified of transfer on 06/04/2014 Name of family member notified:  Grant Lara (wife)  The following physician request were entered in Epic: Physician Request  Please  sign FL2.    Additional CommentsBerton Mount, [place]

## 2014-06-04 NOTE — Progress Notes (Signed)
CSW (Clinical Education officer, museum) prepared pt dc packet and placed with shadow chart. CSW arranged non-emergent ambulance transport. Pt, pt family, pt nurse, and facility informed. CSW signing off.  [place], LCSWA Weekend CSW [phone]

## 2014-06-04 NOTE — Progress Notes (Signed)
Clinical Social Work Department BRIEF PSYCHOSOCIAL ASSESSMENT 06/04/2014  Patient:  Grant Lara, Grant Lara     Account Number:  [id number]     Admit date:  03/21/2009  Clinical Social Worker:  Adair Laundry  Date/Time:  06/04/2014 11:37 AM  Referred by:  Physician  Date Referred:  06/04/2014 Referred for  SNF Placement   Other Referral:   Interview type:  Family Other interview type:   Spoke with pt wife over the phone    PSYCHOSOCIAL DATA Living Status:  WIFE Admitted from facility:   Level of care:   Primary support name:  [facility] Primary support relationship to patient:  SPOUSE Degree of support available:   Pt has strong family support    CURRENT CONCERNS Current Concerns  Post-Acute Placement   Other Concerns:    SOCIAL WORK ASSESSMENT / PLAN CSW spoke with pt wife over the phone about PT recommendation. Pt wife informed CSW that MD had informed her of need for rehab this morning. Pt wife informed CSW that pt has been to [facility] before and she would like for him to return if possible. She expressed that she was very happy with facility cleanliness and rehab available to pt. CSW explained SNF referral process and pt wife is agreeable to referral being sent to all [place] preferred facility cannot offer a bed. Pt wife is agreeable to discharge this weekend if pt is medically stable for discharge.  Pt wife demonstrated good understanding and coping with pt medical condition. She asked appropriate questions and was able to answer CSW questions apprpriately as well.   Assessment/plan status:  Psychosocial Support/Ongoing Assessment of Needs Other assessment/ plan:   Information/referral to community resources:   SNF list to be provided with bed offers    PATIENT'S/FAMILY'S RESPONSE TO PLAN OF CARE: Pt wife pleasant and coopeartive. She is understanding of need for ST rehab and agreeable to SNF.      [place], [place]

## 2014-06-04 NOTE — Discharge Summary (Signed)
Physician Discharge Summary  Grant Lara OLM:[id number] DOB: Apr 27, 1940 DOA: 05/30/2014  PCP: Lynne Logan, MD  Admit date: 05/30/2014 Discharge date: 06/04/2014  Time spent: 30 minutes  Recommendations for Outpatient Follow-up:  1. Follow up with PCP, cardiology and neurology as reocmmended.   Discharge Diagnoses:  Principal Problem:   Syncope Active Problems:   Essential hypertension, benign   Chronic systolic heart failure   Pulmonary embolism   DM2 (diabetes mellitus, type 2)   Anemia   Stroke   Bradycardia   Discharge Condition: improved.   Diet recommendation: regular  Filed Weights   06/01/14 0400 06/02/14 0400 06/04/14 0437  Weight: 60.464 kg (133 lb 4.8 oz) 59.421 kg (131 lb) 59.376 kg (130 lb 14.4 oz)    History of present illness:  This is a 74 y.o. 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 He had a witnessed syncopal episode while eating. Pt was found unconscious by his wife. Pt noted to have been recently admitted 0/1007 for embolic stroke across both hemispheres of the brain w/ lingering bilateral weakness and dysphagia. Pt denies any prodrome prior to sxs. No CP, SOB, nausea, vomiting, abd pain. Pt unaware how long he was unconscious for. Head CT w/ no acute intracranial hemorrhage-with noted multiple chronic infarcts. Patient reported moderate pain in both legs. He underwent a VENOUS duplex and was found to have Positive partially occluding chronic DVT bilaterally and acute thrombus in the superficial left greater saphenous vein. He is currently on xarelto.   Hospital Course:   Syncope  -high risk for neurocardiogenic etiology, he had recent admission for bihemispheric embolic stroke, baseline systolic CHF  -MRI on admission No acute stroke (subacute from last  month) - 2D ECHO: EF 20-25% with diffuse hypokinesis, no new valvular abnl. Cardiology consulted for further recommendations. He underwent dual chamber ICD placement on 2/18. Tele monitoring overnight did not reveal any arrhythmias. There were no witnessed seizure activity.    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  -noted hx/o nonstustained Vtach -cont BB >> given degree of heart failure, would probably continue beta blocker despite bradycardia unless he became symptomatic  4-CAD  -no active CP  -trop neg  -EKG on admission reviewed,showed sinus bradycardia with PAC'S. Unchanged from previous EKG's.  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  He currently denies any SOB, CP, tachycardia, but reports persistent bilateral leg pain . Venous duplex shows, Chronic DVT and acute superficial thrombus left leg and he is on anti coagulation. His pain is much better today. Recommend with acute treatment dose of xarelto till the 3/9 and 20 mg daily for daily xarelto from march 10.   Bilateral foot pain - XR left foot Ordered and films reviewed did not reveal any fractures.  - ABI pending.  - PT/OT assessment recommended home health physical therapy. Maryjo Rochester urine - CPK is within normal limits. He denies any dysuria symptoms.  UA is slightly abnormal  and Urine cultures sent. Currently not on any antibiotics.    Anemia: Normocytic. baseline hemoglobin is around 11. No signs of bleeding. Anemia panel with in normal limits and stool for occult blood pending. He reports having a colonoscopy done in 2012. Repeat cbc in  am shows stable hemoglobin. .    Hypoglycemia:  Poor po intake and recommended liberal diet . Continue to check kCBG.  Procedures: ICD placement.  Consultations:  cardiology  Discharge Exam: Filed Vitals:   06/04/14 1131  BP: 109/58  Pulse: 78  Temp: 99.3 F (37.4 C)  Resp: 15    General: alert afebrile comfortable Cardiovascular: s1s2 Respiratory: ctab  Discharge Instructions   Discharge Instructions    (HEART FAILURE PATIENTS) Call MD:  Anytime you have any of the following symptoms: 1) 3 pound weight gain in 24 hours or 5 pounds in 1 week 2) shortness of breath, with or without a dry hacking cough 3) swelling in the hands, feet or stomach 4) if you have to sleep on extra pillows at night in order to breathe.    Complete by:  As directed      Diet - low sodium heart healthy    Complete by:  As directed      Discharge instructions    Complete by:  As directed   Follow up with PCP in 1 to 2 weeks.  Follow up with neurology as recommended Follow up with cardiology as recommended.          Current Discharge Medication List    START taking these medications   Details  insulin aspart (NOVOLOG) 100 UNIT/ML injection CBG 70 - 120: 0 units CBG 121 - 150: 1 unit CBG 151 - 200: 2 units CBG 201 - 250: 3 units CBG 251 - 300: 5 units CBG 301 - 350: 7 units CBG 351 - 400: 9 units Qty: 10 mL, Refills: 1    spironolactone (ALDACTONE) 25 MG tablet Take 0.5 tablets (12.5 mg total) by mouth daily.      CONTINUE these medications which have CHANGED   Details  !! Rivaroxaban (XARELTO) 15 MG TABS tablet Take 1 tablet (15 mg total) by mouth 2 (two) times daily with a meal. Qty: 30 tablet    !! rivaroxaban (XARELTO) 20 MG TABS tablet Take 1 tablet (20 mg total) by mouth daily with supper. Qty: 30 tablet     !! - Potential duplicate medications found. Please discuss with provider.    CONTINUE these medications which have NOT CHANGED   Details  acetaminophen (TYLENOL)  325 MG tablet Take 2 tablets (650 mg total) by mouth every 6 (six) hours as needed for mild pain, moderate pain or fever.    aspirin EC 81 MG EC tablet Take 1 tablet (81 mg total) by mouth daily. Qty: 30 tablet, Refills: 0    atorvastatin (LIPITOR) 10 MG tablet Take 1 tablet (10 mg total) by mouth daily at 6 PM. Qty: 30 tablet, Refills: 0   Associated Diagnoses: Hyperlipidemia    carvedilol (COREG) 3.125 MG tablet Take 1 tablet (3.125 mg total) by mouth daily. Qty: 90 tablet, Refills: 3    donepezil (ARICEPT) 5 MG tablet Take 5 mg by mouth at bedtime.    latanoprost (XALATAN) 0.005 % ophthalmic solution Place 1 drop into both eyes at bedtime.    lisinopril (PRINIVIL,ZESTRIL) 2.5 MG tablet Take 1 tablet (2.5 mg total) by mouth every morning. Qty: 90 tablet, Refills: 3    tamsulosin (FLOMAX) 0.4 MG  CAPS capsule Take 1 capsule (0.4 mg total) by mouth daily. Qty: 30 capsule, Refills: 0    glucose monitoring kit (FREESTYLE) monitoring kit 1 each by Does not apply route as needed for other. Please give 100 lancets and testing strips.  Check sugars every morning before breakfast and every evening before dinner Qty: 1 each, Refills: 0       No Known Allergies Follow-up Information    Follow up with [facility].   Why:  Home Health Services: Registered Nurse, Physical and Occupational Therapy   Contact information:   [place] [address] [phone]       Follow up with Virl Axe, MD In 3 months.   Specialty:  Cardiology   Why:  we will arrange and contact you.   Contact information:   [address] [address] [phone]       Follow up with [facility] On 06/15/2014.   Why:  3:30 PM   Contact information:   [address] [place] [address] [phone]      Follow up with Minus Breeding, MD On 08/18/2014.   Specialty:  Cardiology   Why:  3:15 PM   Contact information:    [address] [place] [address] [phone]       Follow up with Lynne Logan, MD. Schedule an appointment as soon as possible for a visit in 1 week.   Specialty:  Family Medicine   Contact information:   [address] [place] [address] [phone]        The results of significant diagnostics from this hospitalization (including imaging, microbiology, ancillary and laboratory) are listed below for reference.    Significant Diagnostic Studies: Dg Chest 2 View  06/03/2014   CLINICAL DATA:  Syncope. Ischemic cardiomyopathy. Insertion of AICD.  EXAM: CHEST  2 VIEW  COMPARISON:  05/30/2014 and 11/20/2013  FINDINGS: AICD has been inserted. Heart size and vascularity are normal. No pneumothorax. Lungs are clear except for a small area of scarring in the posterior aspect of the right lower lobe.  IMPRESSION: AICD now in place.  No acute abnormalities.   Electronically Signed   By: Lorriane Shire M.D.   On: 06/03/2014 07:34   Ct Head Wo Contrast  05/30/2014   CLINICAL DATA:  Weakness  EXAM: CT HEAD WITHOUT CONTRAST  TECHNIQUE: Contiguous axial images were obtained from the base of the skull through the vertex without intravenous contrast.  COMPARISON:  CT 04/15/2014 and MRI same day  FINDINGS: No skull fracture is noted. Paranasal sinuses and mastoid air cells are unremarkable. Atherosclerotic calcifications of carotid siphon again noted.  Stable cerebral atrophy. No intracranial hemorrhage, mass effect or midline shift. Again noted remote left cerebellar infarct. Stable chronic lacunar infarct in right basal ganglia. Stable chronic infarct in right occipital lobe. There are better visualized chronic appearing infarcts now in left parieto temporal region see axial image 22. Also chronic appearing infarcts in left frontal lobe see axial image 21. No definite acute cortical infarction. Clinical correlation is necessary. No mass lesion is noted on this unenhanced  scan.  IMPRESSION: No intracranial hemorrhage, mass effect or midline shift. Again noted multiple chronic infarcts as described above. Normal evolution with chronic appearance of recent infarcts in left frontal lobe and left frontoparietal lobe posteriorly. No definite acute cortical infarction. If there is high clinical suspicious for recurrent infarct further correlation  with MRI with diffusion imaging is recommended.   Electronically Signed   By: Lahoma Crocker M.D.   On: 05/30/2014 19:57   Mri Brain Without Contrast  05/30/2014   CLINICAL DATA:  Weakness, possible syncopal episode, found by wife slumped over table, now improving. Dementia. History of hyperlipidemia, diabetes, stroke.  EXAM: MRI HEAD WITHOUT CONTRAST  TECHNIQUE: Multiplanar, multiecho pulse sequences of the brain and surrounding structures were obtained without intravenous contrast.  COMPARISON:  CT of the head May 30, 2014 at 1950 hours and MRI of the brain April 15, 2014  FINDINGS: No reduced diffusion to suggest acute ischemia. Minimal residual reduced diffusion within LEFT parietal and frontal lobes, present on prior imaging, normalized ADC values. The additional tiny acute infarcts from prior examination are no longer apparent by diffusion imaging. No susceptibility artifact to suggest hemorrhage.  Moderate ventriculomegaly, on the basis of global parenchymal brain volume loss, with somewhat disproportionate frontoparietal sulcal enlargement, similar to prior examination. Bilateral frontal, bilateral parietal and bilateral occipital lobe infarcts present on prior examination. Multiple LEFT greater than RIGHT cerebellar remote infarcts. Scattered additional white matter T2 hyperintensities, exclusive of the gliosis again noted likely representing chronic small vessel ischemic disease. Remote RIGHT thalamus and bilateral basal ganglia small infarcts.  No midline shift, mass effect or mass lesions. No abnormal extra-axial fluid  collections. Normal major intracranial vascular flow voids seen at the skull base.  Mild paranasal sinus mucosal thickening. Mastoid air cells appear well-aerated. No abnormal sellar expansion. No cerebellar tonsillar ectopia. Patient appears edentulous.  IMPRESSION: No acute intracranial process.  Subacute to early chronic LEFT frontoparietal infarcts. Multifocal additional remote infarcts spanning multiple vascular territories suggests embolic disease. Basal ganglia and thalamus remote lacunar infarcts.  Moderate to severe global parenchymal brain volume loss.   Electronically Signed   By: Elon Alas   On: 05/30/2014 22:23   Dg Chest Port 1 View  06/04/2014   CLINICAL DATA:  Fever  EXAM: PORTABLE CHEST - 1 VIEW  COMPARISON:  06/03/2014  FINDINGS: There is unchanged cardiomegaly. Transvenous leads appear intact. The lungs are clear. No effusions are evident. Pulmonary vasculature is normal.  IMPRESSION: No acute cardiopulmonary findings   Electronically Signed   By: Andreas Newport M.D.   On: 06/04/2014 06:50   Dg Chest Portable 1 View  05/30/2014   CLINICAL DATA:  Chest pain and shortness of breath today  EXAM: PORTABLE CHEST - 1 VIEW  COMPARISON:  April 15, 2014, CT chest April 18, 2014  FINDINGS: The heart size and mediastinal contours are within normal limits. There is no focal pneumonia, pulmonary edema, or pleural effusion. There is a question 1.9 x 1.1 cm nodule in the right mid to lower lung. This is not definitely appreciated on prior chest x-rays or chest CT. The visualized skeletal structures are stable.  IMPRESSION: No active cardiac disease.  Question 1.9 cm nodule in the right mid to lower lung. This is not definitely appreciated on the prior chest x-ray or chest CT. Recommend further evaluation with a chest CT on outpatient basis.   Electronically Signed   By: Abelardo Diesel M.D.   On: 05/30/2014 18:20   Dg Foot 2 Views Left  05/31/2014   CLINICAL DATA:  Generalized pain in left  foot with no injury, chronic, initial evaluation  EXAM: LEFT FOOT - 2 VIEW  COMPARISON:  None.  FINDINGS: There is no evidence of fracture or dislocation. There is no evidence of arthropathy or other focal bone abnormality.  Soft tissues are unremarkable.  IMPRESSION: Negative.   Electronically Signed   By: Skipper Cliche M.D.   On: 05/31/2014 19:32    Microbiology: Recent Results (from the past 240 hour(s))  Urine culture     Status: None   Collection Time: 05/30/14  6:33 PM  Result Value Ref Range Status   Specimen Description URINE, RANDOM  Final   Special Requests ADDED [id number]  Final   Colony Count   Final    50,000 COLONIES/ML Performed at [facility]    Culture   Final    Multiple bacterial morphotypes present, none predominant. Suggest appropriate recollection if clinically indicated. Performed at [facility]    Report Status 06/02/2014 FINAL  Final     Labs: Basic Metabolic Panel:  Recent Labs Lab 05/30/14 1739 06/01/14 0404 06/02/14 0415 06/03/14 0655 06/04/14 0348  NA 137 139 140 137 137  K 4.5 3.9 4.1 4.3 4.1  CL 105 108 108 105 103  CO2 27 28 26 27 27   GLUCOSE 255* 80 76 108* 110*  BUN 12 11 14 12 12   CREATININE 1.51* 1.48* 1.33 1.39* 1.39*  CALCIUM 8.7 8.5 8.7 8.4 8.5   Liver Function Tests: No results for input(s): AST, ALT, ALKPHOS, BILITOT, PROT, ALBUMIN in the last 168 hours. No results for input(s): LIPASE, AMYLASE in the last 168 hours. No results for input(s): AMMONIA in the last 168 hours. CBC:  Recent Labs Lab 05/30/14 1739 06/01/14 0404 06/02/14 0415 06/03/14 0655 06/04/14 0348  WBC 5.9 6.5 6.5 7.0 8.0  NEUTROABS 4.4  --   --   --   --   HGB 11.7* 10.2* 10.4* 10.5* 10.4*  HCT 35.1* 30.9* 31.7* 31.9* 31.6*  MCV 90.9 90.6 92.7 91.4 90.5  PLT 163 161 162 149* 137*   Cardiac Enzymes:  Recent Labs Lab 06/01/14 0404  CKTOTAL 51   BNP: BNP (last 3 results) No results for input(s): BNP in the last 8760  hours.  ProBNP (last 3 results) No results for input(s): PROBNP in the last 8760 hours.  CBG:  Recent Labs Lab 06/03/14 2117 06/04/14 0033 06/04/14 0428 06/04/14 0727 06/04/14 1129  GLUCAP 137* 122* 107* 117* 143*       Signed:  Lajuan Godbee  [facility] 06/04/2014, 12:48 PM

## 2014-06-06 ENCOUNTER — Encounter: Payer: Self-pay | Admitting: Adult Health

## 2014-06-06 ENCOUNTER — Non-Acute Institutional Stay (SKILLED_NURSING_FACILITY): Payer: Medicare Other | Admitting: Adult Health

## 2014-06-06 DIAGNOSIS — I5022 Chronic systolic (congestive) heart failure: Secondary | ICD-10-CM

## 2014-06-06 DIAGNOSIS — I2699 Other pulmonary embolism without acute cor pulmonale: Secondary | ICD-10-CM

## 2014-06-06 DIAGNOSIS — N189 Chronic kidney disease, unspecified: Secondary | ICD-10-CM

## 2014-06-06 DIAGNOSIS — I2510 Atherosclerotic heart disease of native coronary artery without angina pectoris: Secondary | ICD-10-CM

## 2014-06-06 DIAGNOSIS — M79604 Pain in right leg: Secondary | ICD-10-CM

## 2014-06-06 DIAGNOSIS — M79605 Pain in left leg: Secondary | ICD-10-CM

## 2014-06-06 DIAGNOSIS — N183 Chronic kidney disease, stage 3 unspecified: Secondary | ICD-10-CM

## 2014-06-06 DIAGNOSIS — M79671 Pain in right foot: Secondary | ICD-10-CM

## 2014-06-06 DIAGNOSIS — R55 Syncope and collapse: Secondary | ICD-10-CM

## 2014-06-06 DIAGNOSIS — I10 Essential (primary) hypertension: Secondary | ICD-10-CM

## 2014-06-06 DIAGNOSIS — I82401 Acute embolism and thrombosis of unspecified deep veins of right lower extremity: Secondary | ICD-10-CM

## 2014-06-06 DIAGNOSIS — F0150 Vascular dementia without behavioral disturbance: Secondary | ICD-10-CM

## 2014-06-06 DIAGNOSIS — M79672 Pain in left foot: Secondary | ICD-10-CM

## 2014-06-06 DIAGNOSIS — E785 Hyperlipidemia, unspecified: Secondary | ICD-10-CM

## 2014-06-06 DIAGNOSIS — I639 Cerebral infarction, unspecified: Secondary | ICD-10-CM

## 2014-06-06 DIAGNOSIS — E1122 Type 2 diabetes mellitus with diabetic chronic kidney disease: Secondary | ICD-10-CM

## 2014-06-06 DIAGNOSIS — R531 Weakness: Secondary | ICD-10-CM

## 2014-06-06 NOTE — Progress Notes (Signed)
Patient ID: Grant Lara, male   DOB: 05/27/1940, 74 y.o.   MRN: [id number]   06/06/2014  Facility:  [facility] Home Location:  [place] Room Number: 1006-2 LEVEL OF CARE:  SNF (31)   Chief Complaint  Patient presents with  . Hospitalization Follow-up    Generalized weakness, Syncope, embolic stroke, CHF, CKD, CAD, PE, bilateral foot pain, diabetes mellitus, hypertension, hyperlipidemia and dementia    HISTORY OF PRESENT ILLNESS:  This is a 74 year old male was being admitted to [facility] on 06/04/14 from [facility] He has PMH of embolic stroke, DVT, PE, CAD S/P stent, diabetes mellitus type 2 and CKD. He had syncopal episode while eating. He was recently hospitalized  On 07/08/69 for embolic stroke with bilateral weakness and dysphagia. He complains of bilateral lower extremity pain and venous duplex done in the hospital showed partially occluding chronic DVT bilaterally and acute thrombus in the superficial leg greater saphenous vein. He is currently on Xarelto. He has been admitted for a short-term rehabilitation.  PAST MEDICAL HISTORY:  Past Medical History  Diagnosis Date  . Hypertension   . Dilated cardiomyopathy     2/12: EF 30-35%, trivial AI, mild RAE.  EF 2014 40 -45%  . CAD (coronary artery disease)     LHC 9/05 with Dr. Einar Gip:  dLM 20-30%, LAD 85%, oD1 20-30%.  PCI:  Taxus DES to LAD; Dx jailed and tx with POBA.  Last myoview 12/10: inf scar, no ischemia, EF 29%.  . DVT (deep venous thrombosis)   . Pulmonary embolus     chronic coumadin  . Hyperhomocystinemia   . PFO (patent foramen ovale)     Not mentioned on 2014 echo.  [name] HLD (hyperlipidemia)   . Crohn's disease   . Nephrolithiasis   . Diabetes mellitus     11/05/11 "borderline; don't take medications"  . Stroke 1993    "left arm can't hold steady; leg too"  . Arthritis     "used to have a touch in my legs"  . Memory difficulties   . Pneumonia ~ 2011    09-22-13 denies any  recent SOB or breathing problems  . Swelling of both ankles      09-22-13 occ.feet, but denies pain.  . Bradycardia 06/01/2014    CURRENT MEDICATIONS: Reviewed per MAR/see medication list  No Known Allergies   REVIEW OF SYSTEMS:  GENERAL: no change in appetite, no fatigue, no weight changes, no fever, chills or weakness RESPIRATORY: no cough, SOB, DOE, wheezing, hemoptysis CARDIAC: no chest pain, edema or palpitations GI: no abdominal pain, diarrhea, constipation, heart burn, nausea or vomiting  PHYSICAL EXAMINATION  GENERAL: no acute distress, normal body habitus EYES: conjunctivae normal, sclerae normal, normal eye lids NECK: supple, trachea midline, no neck masses, no thyroid tenderness, no thyromegaly LYMPHATICS: no LAN in the neck, no supraclavicular LAN RESPIRATORY: breathing is even & unlabored, BS CTAB CARDIAC: RRR, no murmur,no extra heart sounds, no edema GI: abdomen soft, normal BS, no masses, no tenderness, no hepatomegaly, no splenomegaly EXTREMITIES: Able to move 4 extremities PSYCHIATRIC: the patient is alert & oriented to person, affect & behavior appropriate  LABS/RADIOLOGY: Labs reviewed: Basic Metabolic Panel:  Recent Labs  08/22/13 0822  04/16/14 0525  04/18/14 0912  06/02/14 0415 06/03/14 0655 06/04/14 0348  NA 140  < >  --   < >  --   < > 140 137 137  K 4.0  < >  --   < >  --   < >  4.1 4.3 4.1  CL 109  < >  --   < >  --   < > 108 105 103  CO2 20  < >  --   < >  --   < > 26 27 27   GLUCOSE 105*  < >  --   < >  --   < > 76 108* 110*  BUN 22  < >  --   < >  --   < > 14 12 12   CREATININE 1.23  < >  --   < >  --   < > 1.33 1.39* 1.39*  CALCIUM 8.1*  < >  --   < >  --   < > 8.7 8.4 8.5  MG 1.8  --  1.7  --  2.2  --   --   --   --   PHOS 2.0*  --   --   --   --   --   --   --   --   < > = values in this interval not displayed. Liver Function Tests:  Recent Labs  08/22/13 0822 11/19/13 2039 04/15/14 1619  AST 7 9 16   ALT 7 6 11   ALKPHOS 75 84  116  BILITOT 0.9 1.7* 0.6  PROT 5.2* 7.0 6.4  ALBUMIN 2.3* 2.7* 3.4*    Recent Labs  07/13/13 0049 07/14/13 0525  LIPASE 68* 29   CBC:  Recent Labs  11/19/13 2039  04/15/14 1619  05/30/14 1739  06/02/14 0415 06/03/14 0655 06/04/14 0348  WBC 12.2*  < > 4.8  --  5.9  < > 6.5 7.0 8.0  NEUTROABS 10.6*  --  2.6  --  4.4  --   --   --   --   HGB 13.5  < > 14.3  < > 11.7*  < > 10.4* 10.5* 10.4*  HCT 39.8  < > 42.9  < > 35.1*  < > 31.7* 31.9* 31.6*  MCV 93.2  < > 92.1  --  90.9  < > 92.7 91.4 90.5  PLT 173  < > 138*  --  163  < > 162 149* 137*  < > = values in this interval not displayed.  Lipid Panel:  Recent Labs  04/16/14 0030  HDL 48   Cardiac Enzymes:  Recent Labs  04/15/14 1854 04/16/14 0030 04/16/14 0525 06/01/14 0404  CKTOTAL  --   --   --  51  TROPONINI <0.03 0.03 0.03  --    CBG:  Recent Labs  06/04/14 0428 06/04/14 0727 06/04/14 1129  GLUCAP 107* 117* 143*    Dg Chest 2 View  06/03/2014   CLINICAL DATA:  Syncope. Ischemic cardiomyopathy. Insertion of AICD.  EXAM: CHEST  2 VIEW  COMPARISON:  05/30/2014 and 11/20/2013  FINDINGS: AICD has been inserted. Heart size and vascularity are normal. No pneumothorax. Lungs are clear except for a small area of scarring in the posterior aspect of the right lower lobe.  IMPRESSION: AICD now in place.  No acute abnormalities.   Electronically Signed   By: Lorriane Shire M.D.   On: 06/03/2014 07:34   Ct Head Wo Contrast  05/30/2014   CLINICAL DATA:  Weakness  EXAM: CT HEAD WITHOUT CONTRAST  TECHNIQUE: Contiguous axial images were obtained from the base of the skull through the vertex without intravenous contrast.  COMPARISON:  CT 04/15/2014 and MRI same day  FINDINGS: No skull fracture is noted. Paranasal  sinuses and mastoid air cells are unremarkable. Atherosclerotic calcifications of carotid siphon again noted.  Stable cerebral atrophy. No intracranial hemorrhage, mass effect or midline shift. Again noted remote left  cerebellar infarct. Stable chronic lacunar infarct in right basal ganglia. Stable chronic infarct in right occipital lobe. There are better visualized chronic appearing infarcts now in left parieto temporal region see axial image 22. Also chronic appearing infarcts in left frontal lobe see axial image 21. No definite acute cortical infarction. Clinical correlation is necessary. No mass lesion is noted on this unenhanced scan.  IMPRESSION: No intracranial hemorrhage, mass effect or midline shift. Again noted multiple chronic infarcts as described above. Normal evolution with chronic appearance of recent infarcts in left frontal lobe and left frontoparietal lobe posteriorly. No definite acute cortical infarction. If there is high clinical suspicious for recurrent infarct further correlation with MRI with diffusion imaging is recommended.   Electronically Signed   By: Lahoma Crocker M.D.   On: 05/30/2014 19:57   Mri Brain Without Contrast  05/30/2014   CLINICAL DATA:  Weakness, possible syncopal episode, found by wife slumped over table, now improving. Dementia. History of hyperlipidemia, diabetes, stroke.  EXAM: MRI HEAD WITHOUT CONTRAST  TECHNIQUE: Multiplanar, multiecho pulse sequences of the brain and surrounding structures were obtained without intravenous contrast.  COMPARISON:  CT of the head May 30, 2014 at 1950 hours and MRI of the brain April 15, 2014  FINDINGS: No reduced diffusion to suggest acute ischemia. Minimal residual reduced diffusion within LEFT parietal and frontal lobes, present on prior imaging, normalized ADC values. The additional tiny acute infarcts from prior examination are no longer apparent by diffusion imaging. No susceptibility artifact to suggest hemorrhage.  Moderate ventriculomegaly, on the basis of global parenchymal brain volume loss, with somewhat disproportionate frontoparietal sulcal enlargement, similar to prior examination. Bilateral frontal, bilateral parietal and  bilateral occipital lobe infarcts present on prior examination. Multiple LEFT greater than RIGHT cerebellar remote infarcts. Scattered additional white matter T2 hyperintensities, exclusive of the gliosis again noted likely representing chronic small vessel ischemic disease. Remote RIGHT thalamus and bilateral basal ganglia small infarcts.  No midline shift, mass effect or mass lesions. No abnormal extra-axial fluid collections. Normal major intracranial vascular flow voids seen at the skull base.  Mild paranasal sinus mucosal thickening. Mastoid air cells appear well-aerated. No abnormal sellar expansion. No cerebellar tonsillar ectopia. Patient appears edentulous.  IMPRESSION: No acute intracranial process.  Subacute to early chronic LEFT frontoparietal infarcts. Multifocal additional remote infarcts spanning multiple vascular territories suggests embolic disease. Basal ganglia and thalamus remote lacunar infarcts.  Moderate to severe global parenchymal brain volume loss.   Electronically Signed   By: Elon Alas   On: 05/30/2014 22:23   Dg Chest Port 1 View  06/04/2014   CLINICAL DATA:  Fever  EXAM: PORTABLE CHEST - 1 VIEW  COMPARISON:  06/03/2014  FINDINGS: There is unchanged cardiomegaly. Transvenous leads appear intact. The lungs are clear. No effusions are evident. Pulmonary vasculature is normal.  IMPRESSION: No acute cardiopulmonary findings   Electronically Signed   By: Andreas Newport M.D.   On: 06/04/2014 06:50   Dg Chest Portable 1 View  05/30/2014   CLINICAL DATA:  Chest pain and shortness of breath today  EXAM: PORTABLE CHEST - 1 VIEW  COMPARISON:  April 15, 2014, CT chest April 18, 2014  FINDINGS: The heart size and mediastinal contours are within normal limits. There is no focal pneumonia, pulmonary edema, or pleural effusion. There is a  question 1.9 x 1.1 cm nodule in the right mid to lower lung. This is not definitely appreciated on prior chest x-rays or chest CT. The visualized  skeletal structures are stable.  IMPRESSION: No active cardiac disease.  Question 1.9 cm nodule in the right mid to lower lung. This is not definitely appreciated on the prior chest x-ray or chest CT. Recommend further evaluation with a chest CT on outpatient basis.   Electronically Signed   By: Abelardo Diesel M.D.   On: 05/30/2014 18:20   Dg Foot 2 Views Left  05/31/2014   CLINICAL DATA:  Generalized pain in left foot with no injury, chronic, initial evaluation  EXAM: LEFT FOOT - 2 VIEW  COMPARISON:  None.  FINDINGS: There is no evidence of fracture or dislocation. There is no evidence of arthropathy or other focal bone abnormality. Soft tissues are unremarkable.  IMPRESSION: Negative.   Electronically Signed   By: Skipper Cliche M.D.   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; continue Xarelto Chronic Systolic Heart Failure -  S/P ICD Placement; continue Coreg 3.125 mg PO Q D CKD, stage III - creatinine 1.39; baseline CAD - continue aspirin 81 mg by mouth daily Pulmonary embolism - PE while on Coumadin; continue Xarelto Bilateral foot pain - apply Biofreeze gel transdermally to bilateral feet twice a day and Tylenol 325 mg 2 tabs= 650 mg by mouth twice a day Diabetes mellitus, type II -  continue NovoLog sliding scale before meals and at bedtime: Hemoglobin A1c 6.5 DVT, BLE - continue Xarelto 15 mg po BID till 06/22/14 and then Xarelto 20 mg PO Q D Hypertension - well controlled; continue Aldactone 12.5 mg by mouth daily, Coreg 3.125 mg by mouth daily and lisinopril 2.5 mg by mouth daily Hyperlipidemia - continue Lipitor 10 mg by mouth daily Dementia - continue Aricept 5 mg by mouth daily at bedtime   Goals of care:  Short-term rehabilitation  Labs/test ordered:  BMP  Spent 50 minutes in patient care.     [facility], NP [employer] [phone]

## 2014-06-06 NOTE — Clinical Social Work Psychosocial (Signed)
     Clinical Social Work Department BRIEF PSYCHOSOCIAL ASSESSMENT 06/04/2014  Patient:  Grant Lara, Grant Lara     Account Number:  [id number]     Admit date:  03/21/2009  Clinical Social Worker:  Adair Laundry  Date/Time:  06/04/2014 11:37 AM  Referred by:  Physician  Date Referred:  06/04/2014 Referred for  SNF Placement   Other Referral:   Interview type:  Family Other interview type:   Spoke with pt wife over the phone    PSYCHOSOCIAL DATA Living Status:  WIFE Admitted from facility:   Level of care:   Primary support name:  [facility] Primary support relationship to patient:  SPOUSE Degree of support available:   Pt has strong family support    CURRENT CONCERNS Current Concerns  Post-Acute Placement   Other Concerns:    SOCIAL WORK ASSESSMENT / PLAN CSW spoke with pt wife over the phone about PT recommendation. Pt wife informed CSW that MD had informed her of need for rehab this morning. Pt wife informed CSW that pt has been to [facility] before and she would like for him to return if possible. She expressed that she was very happy with facility cleanliness and rehab available to pt. CSW explained SNF referral process and pt wife is agreeable to referral being sent to all [place] preferred facility cannot offer a bed. Pt wife is agreeable to discharge this weekend if pt is medically stable for discharge.  Pt wife demonstrated good understanding and coping with pt medical condition. She asked appropriate questions and was able to answer CSW questions apprpriately as well.   Assessment/plan status:  Psychosocial Support/Ongoing Assessment of Needs Other assessment/ plan:   Information/referral to community resources:   SNF list to be provided with bed offers    PATIENTS/FAMILYS RESPONSE TO PLAN OF CARE: Pt wife pleasant and coopeartive. She is understanding of need for ST rehab and agreeable to SNF.      [place], [place]

## 2014-06-06 NOTE — Clinical Social Work Placement (Signed)
     Clinical Social Work Department CLINICAL SOCIAL WORK PLACEMENT NOTE 06/04/2014  Patient:  Grant Lara, Grant Lara  Account Number:  [id number] Admit date:  03/21/2009  Clinical Social Worker:  Adair Laundry  Date/time:  06/04/2014 11:41 AM  Clinical Social Work is seeking post-discharge placement for this patient at the following level of care:   SKILLED NURSING   (*CSW will update this form in Epic as items are completed)   06/04/2014  Patient/family provided with [place] Department of Clinical Social Works list of facilities offering this level of care within the geographic area requested by the patient (or if unable, by the patients family).  06/04/2014  Patient/family informed of their freedom to choose among providers that offer the needed level of care, that participate in Medicare, Medicaid or managed care program needed by the patient, have an available bed and are willing to accept the patient.  06/04/2014  Patient/family informed of [facility] ownership interest in [facility], as well as of the fact that they are under no obligation to receive care at this facility.  PASARR submitted to EDS on existing PASARR number received on   FL2 transmitted to all facilities in geographic area requested by pt/family on  06/04/2014 FL2 transmitted to all facilities within larger geographic area on   Patient informed that his/her managed care company has contracts with or will negotiate with  certain facilities, including the following:     Patient/family informed of bed offers received:   Patient chooses bed at  Physician recommends and patient chooses bed at    Patient to be transferred to  on   Patient to be transferred to facility by  Patient and family notified of transfer on  Name of family member notified:    The following physician request were entered in Epic: Physician Request  Please sign FL2.    Additional CommentsBerton Mount,  [place]

## 2014-06-07 ENCOUNTER — Telehealth: Payer: Self-pay | Admitting: Oncology

## 2014-06-07 NOTE — Telephone Encounter (Signed)
Chart delivered 06/07/14

## 2014-06-08 ENCOUNTER — Non-Acute Institutional Stay (SKILLED_NURSING_FACILITY): Payer: Medicare Other | Admitting: Internal Medicine

## 2014-06-08 DIAGNOSIS — E1122 Type 2 diabetes mellitus with diabetic chronic kidney disease: Secondary | ICD-10-CM | POA: Diagnosis not present

## 2014-06-08 DIAGNOSIS — I639 Cerebral infarction, unspecified: Secondary | ICD-10-CM

## 2014-06-08 DIAGNOSIS — I2510 Atherosclerotic heart disease of native coronary artery without angina pectoris: Secondary | ICD-10-CM | POA: Diagnosis not present

## 2014-06-08 DIAGNOSIS — I2699 Other pulmonary embolism without acute cor pulmonale: Secondary | ICD-10-CM

## 2014-06-08 DIAGNOSIS — M79671 Pain in right foot: Secondary | ICD-10-CM

## 2014-06-08 DIAGNOSIS — E785 Hyperlipidemia, unspecified: Secondary | ICD-10-CM

## 2014-06-08 DIAGNOSIS — I5022 Chronic systolic (congestive) heart failure: Secondary | ICD-10-CM | POA: Diagnosis not present

## 2014-06-08 DIAGNOSIS — R55 Syncope and collapse: Secondary | ICD-10-CM | POA: Diagnosis not present

## 2014-06-08 DIAGNOSIS — M79672 Pain in left foot: Secondary | ICD-10-CM

## 2014-06-08 DIAGNOSIS — N183 Chronic kidney disease, stage 3 unspecified: Secondary | ICD-10-CM

## 2014-06-08 DIAGNOSIS — F0390 Unspecified dementia without behavioral disturbance: Secondary | ICD-10-CM

## 2014-06-08 DIAGNOSIS — R531 Weakness: Secondary | ICD-10-CM

## 2014-06-08 DIAGNOSIS — I6340 Cerebral infarction due to embolism of unspecified cerebral artery: Secondary | ICD-10-CM | POA: Diagnosis not present

## 2014-06-08 DIAGNOSIS — N189 Chronic kidney disease, unspecified: Secondary | ICD-10-CM

## 2014-06-14 ENCOUNTER — Ambulatory Visit: Payer: Self-pay | Admitting: Cardiovascular Disease

## 2014-06-14 DIAGNOSIS — Z5181 Encounter for therapeutic drug level monitoring: Secondary | ICD-10-CM

## 2014-06-14 DIAGNOSIS — I2699 Other pulmonary embolism without acute cor pulmonale: Secondary | ICD-10-CM

## 2014-06-14 DIAGNOSIS — I639 Cerebral infarction, unspecified: Secondary | ICD-10-CM

## 2014-06-15 ENCOUNTER — Ambulatory Visit: Payer: Medicare Other

## 2014-06-16 ENCOUNTER — Encounter: Payer: Self-pay | Admitting: Internal Medicine

## 2014-06-22 ENCOUNTER — Other Ambulatory Visit: Payer: Medicare Other

## 2014-06-22 ENCOUNTER — Ambulatory Visit: Payer: Medicare Other | Admitting: Oncology

## 2014-06-22 ENCOUNTER — Ambulatory Visit: Payer: Medicare Other

## 2014-06-23 ENCOUNTER — Ambulatory Visit (INDEPENDENT_AMBULATORY_CARE_PROVIDER_SITE_OTHER): Payer: Medicare Other | Admitting: *Deleted

## 2014-06-23 DIAGNOSIS — I5022 Chronic systolic (congestive) heart failure: Secondary | ICD-10-CM

## 2014-06-23 DIAGNOSIS — R001 Bradycardia, unspecified: Secondary | ICD-10-CM

## 2014-06-23 LAB — MDC_IDC_ENUM_SESS_TYPE_INCLINIC
Battery Voltage: 3.14 V
Brady Statistic AP VS Percent: 21.46 %
Brady Statistic AS VP Percent: 0.04 %
Brady Statistic AS VS Percent: 78.47 %
Brady Statistic RA Percent Paced: 21.49 %
Brady Statistic RV Percent Paced: 0.07 %
Date Time Interrogation Session: 20160310160715
HighPow Impedance: 190 Ohm
HighPow Impedance: 71 Ohm
Lead Channel Pacing Threshold Amplitude: 0.5 V
Lead Channel Pacing Threshold Pulse Width: 0.4 ms
Lead Channel Sensing Intrinsic Amplitude: 20 mV
Lead Channel Sensing Intrinsic Amplitude: 23.125 mV
Lead Channel Sensing Intrinsic Amplitude: 7 mV
MDC IDC MSMT BATTERY REMAINING LONGEVITY: 130 mo
MDC IDC MSMT LEADCHNL RA IMPEDANCE VALUE: 456 Ohm
MDC IDC MSMT LEADCHNL RA PACING THRESHOLD PULSEWIDTH: 0.4 ms
MDC IDC MSMT LEADCHNL RA SENSING INTR AMPL: 6.125 mV
MDC IDC MSMT LEADCHNL RV IMPEDANCE VALUE: 399 Ohm
MDC IDC MSMT LEADCHNL RV PACING THRESHOLD AMPLITUDE: 0.5 V
MDC IDC SET LEADCHNL RA PACING AMPLITUDE: 3.5 V
MDC IDC SET LEADCHNL RV PACING AMPLITUDE: 3.5 V
MDC IDC SET LEADCHNL RV PACING PULSEWIDTH: 0.4 ms
MDC IDC SET LEADCHNL RV SENSING SENSITIVITY: 0.3 mV
MDC IDC SET ZONE DETECTION INTERVAL: 250 ms
MDC IDC SET ZONE DETECTION INTERVAL: 300 ms
MDC IDC SET ZONE DETECTION INTERVAL: 370 ms
MDC IDC STAT BRADY AP VP PERCENT: 0.02 %
Zone Setting Detection Interval: 350 ms

## 2014-06-23 NOTE — Progress Notes (Signed)
Wound check appointment. Dermabond removed. Wound without redness or edema. Incision edges approximated, wound well healed. Normal device function. Thresholds, sensing, and impedances consistent with implant measurements. Device programmed at 3.5V for extra safety margin until 3 month visit. Histogram distribution appropriate for patient and level of activity. No mode switches or ventricular arrhythmias noted. Patient educated about wound care, arm mobility, lifting restrictions, shock plan. ROV in 3 months with implanting physician.

## 2014-07-01 ENCOUNTER — Non-Acute Institutional Stay (SKILLED_NURSING_FACILITY): Payer: Medicare Other | Admitting: Adult Health

## 2014-07-01 DIAGNOSIS — I2510 Atherosclerotic heart disease of native coronary artery without angina pectoris: Secondary | ICD-10-CM

## 2014-07-01 DIAGNOSIS — M79672 Pain in left foot: Secondary | ICD-10-CM

## 2014-07-01 DIAGNOSIS — E785 Hyperlipidemia, unspecified: Secondary | ICD-10-CM | POA: Diagnosis not present

## 2014-07-01 DIAGNOSIS — E1122 Type 2 diabetes mellitus with diabetic chronic kidney disease: Secondary | ICD-10-CM | POA: Diagnosis not present

## 2014-07-01 DIAGNOSIS — I5022 Chronic systolic (congestive) heart failure: Secondary | ICD-10-CM

## 2014-07-01 DIAGNOSIS — F0150 Vascular dementia without behavioral disturbance: Secondary | ICD-10-CM

## 2014-07-01 DIAGNOSIS — M79671 Pain in right foot: Secondary | ICD-10-CM

## 2014-07-01 DIAGNOSIS — N189 Chronic kidney disease, unspecified: Secondary | ICD-10-CM

## 2014-07-01 DIAGNOSIS — N183 Chronic kidney disease, stage 3 unspecified: Secondary | ICD-10-CM

## 2014-07-01 DIAGNOSIS — F0390 Unspecified dementia without behavioral disturbance: Secondary | ICD-10-CM | POA: Diagnosis not present

## 2014-07-01 DIAGNOSIS — I2699 Other pulmonary embolism without acute cor pulmonale: Secondary | ICD-10-CM

## 2014-07-01 DIAGNOSIS — I639 Cerebral infarction, unspecified: Secondary | ICD-10-CM

## 2014-07-01 DIAGNOSIS — M79605 Pain in left leg: Secondary | ICD-10-CM

## 2014-07-01 DIAGNOSIS — I82401 Acute embolism and thrombosis of unspecified deep veins of right lower extremity: Secondary | ICD-10-CM | POA: Diagnosis not present

## 2014-07-01 DIAGNOSIS — R531 Weakness: Secondary | ICD-10-CM | POA: Diagnosis not present

## 2014-07-01 DIAGNOSIS — M79604 Pain in right leg: Secondary | ICD-10-CM | POA: Diagnosis not present

## 2014-07-02 ENCOUNTER — Encounter: Payer: Self-pay | Admitting: Adult Health

## 2014-07-02 NOTE — Progress Notes (Signed)
Patient ID: Grant Lara, male   DOB: [birth year], 74 y.o.   MRN: [id number]   07/01/14  Facility:  Nursing Home Location:  [place] Room Number: 1006-2 LEVEL OF CARE:  SNF (31)  Chief Complaint  Patient presents with  . Medical Management of Chronic Issues    Generalized weakness, embolic stroke, chronic systolic CHF, CKD, CAD, bilateral foot pain, hypotension, hyperlipidemia and dementia    HISTORY OF PRESENT ILLNESS:  This is a 74 year old male who has been admitted to [facility] on 06/04/14 from [facility]. He has PMH of embolic stroke, DVT, PE, CAD S/P stent, diabetes mellitus type 2 and CKD. He had syncopal episode while eating. He was recently hospitalized  On 10/03/28 for embolic stroke with bilateral weakness and dysphagia. He complains of bilateral lower extremity pain and venous duplex done in the hospital showed partially occluding chronic DVT bilaterally and acute thrombus in the superficial leg greater saphenous vein. He is currently on Xarelto. He has been admitted for a short-term rehabilitation.  He was about to be discharged but creatinine noted to be elevated 2.23. IV fluids given and current creatinine 1.44. BP has been noted to be low , 90/60 and 87/44.  PAST MEDICAL HISTORY:  Past Medical History  Diagnosis Date  . Hypertension   . Dilated cardiomyopathy     2/12: EF 30-35%, trivial AI, mild RAE.  EF 2014 40 -45%  . CAD (coronary artery disease)     LHC 9/05 with Dr. Einar Gip:  dLM 20-30%, LAD 85%, oD1 20-30%.  PCI:  Taxus DES to LAD; Dx jailed and tx with POBA.  Last myoview 12/10: inf scar, no ischemia, EF 29%.  . DVT (deep venous thrombosis)   . Pulmonary embolus     chronic coumadin  . Hyperhomocystinemia   . PFO (patent foramen ovale)     Not mentioned on 2014 echo.  [name] HLD (hyperlipidemia)   . Crohn's disease   . Nephrolithiasis   . Diabetes mellitus     11/05/11 "borderline; don't take medications"  . Stroke 1993   "left arm can't hold steady; leg too"  . Arthritis     "used to have a touch in my legs"  . Memory difficulties   . Pneumonia ~ 2011    09-22-13 denies any recent SOB or breathing problems  . Swelling of both ankles      09-22-13 occ.feet, but denies pain.  . Bradycardia 06/01/2014    CURRENT MEDICATIONS: Reviewed per MAR/see medication list  No Known Allergies   REVIEW OF SYSTEMS:  GENERAL: no change in appetite, no fatigue, no weight changes, no fever, chills or weakness RESPIRATORY: no cough, SOB, DOE, wheezing, hemoptysis CARDIAC: no chest pain, edema or palpitations GI: no abdominal pain, diarrhea, constipation, heart burn, nausea or vomiting  PHYSICAL EXAMINATION  GENERAL: no acute distress, normal body habitus NECK: supple, trachea midline, no neck masses, no thyroid tenderness, no thyromegaly LYMPHATICS: no LAN in the neck, no supraclavicular LAN RESPIRATORY: breathing is even & unlabored, BS CTAB CARDIAC: RRR, no murmur,no extra heart sounds, no edema, ICD implant GI: abdomen soft, normal BS, no masses, no tenderness, no hepatomegaly, no splenomegaly EXTREMITIES: Able to move 4 extremities PSYCHIATRIC: the patient is alert & oriented to person, affect & behavior appropriate  LABS/RADIOLOGY: 06/25/14  sodium 135 potassium 5.2 glucose 77 BUN 15 creatinine 1.44 calcium 8.9 06/23/14  WBC 8.5 hemoglobin 10.7 hematocrit 32.6 MCV 91.3 sodium 136 potassium  5.3 glucose 76 BUN 69 creatinine 2.23 calcium 9.1 06/15/14  WBC 8.0 hemoglobin 9.5 hematocrit 29.7 MCV 92.8 sodium 136 potassium 4.9 glucose 91 BUN 38 creatinine 1.41 calcium 8.7   Labs reviewed: Basic Metabolic Panel:  Recent Labs  08/22/13 0822  04/16/14 0525  04/18/14 0912  06/02/14 0415 06/03/14 0655 06/04/14 0348  NA 140  < >  --   < >  --   < > 140 137 137  K 4.0  < >  --   < >  --   < > 4.1 4.3 4.1  CL 109  < >  --   < >  --   < > 108 105 103  CO2 20  < >  --   < >  --   < > 26 27 27   GLUCOSE 105*  < >  --    < >  --   < > 76 108* 110*  BUN 22  < >  --   < >  --   < > 14 12 12   CREATININE 1.23  < >  --   < >  --   < > 1.33 1.39* 1.39*  CALCIUM 8.1*  < >  --   < >  --   < > 8.7 8.4 8.5  MG 1.8  --  1.7  --  2.2  --   --   --   --   PHOS 2.0*  --   --   --   --   --   --   --   --   < > = values in this interval not displayed. Liver Function Tests:  Recent Labs  08/22/13 0822 11/19/13 2039 04/15/14 1619  AST 7 9 16   ALT 7 6 11   ALKPHOS 75 84 116  BILITOT 0.9 1.7* 0.6  PROT 5.2* 7.0 6.4  ALBUMIN 2.3* 2.7* 3.4*    Recent Labs  07/13/13 0049 07/14/13 0525  LIPASE 68* 29   CBC:  Recent Labs  11/19/13 2039  04/15/14 1619  05/30/14 1739  06/02/14 0415 06/03/14 0655 06/04/14 0348  WBC 12.2*  < > 4.8  --  5.9  < > 6.5 7.0 8.0  NEUTROABS 10.6*  --  2.6  --  4.4  --   --   --   --   HGB 13.5  < > 14.3  < > 11.7*  < > 10.4* 10.5* 10.4*  HCT 39.8  < > 42.9  < > 35.1*  < > 31.7* 31.9* 31.6*  MCV 93.2  < > 92.1  --  90.9  < > 92.7 91.4 90.5  PLT 173  < > 138*  --  163  < > 162 149* 137*  < > = values in this interval not displayed.  Lipid Panel:  Recent Labs  04/16/14 0030  HDL 48   Cardiac Enzymes:  Recent Labs  04/15/14 1854 04/16/14 0030 04/16/14 0525 06/01/14 0404  CKTOTAL  --   --   --  51  TROPONINI <0.03 0.03 0.03  --    CBG:  Recent Labs  06/04/14 0428 06/04/14 0727 06/04/14 1129  GLUCAP 107* 117* 143*    Dg Chest 2 View  06/03/2014   CLINICAL DATA:  Syncope. Ischemic cardiomyopathy. Insertion of AICD.  EXAM: CHEST  2 VIEW  COMPARISON:  05/30/2014 and 11/20/2013  FINDINGS: AICD has been inserted. Heart size and vascularity are normal. No pneumothorax. Lungs are clear except for  a small area of scarring in the posterior aspect of the right lower lobe.  IMPRESSION: AICD now in place.  No acute abnormalities.   Electronically Signed   By: Lorriane Shire M.D.   On: 06/03/2014 07:34   Dg Chest Port 1 View  06/04/2014   CLINICAL DATA:  Fever  EXAM: PORTABLE  CHEST - 1 VIEW  COMPARISON:  06/03/2014  FINDINGS: There is unchanged cardiomegaly. Transvenous leads appear intact. The lungs are clear. No effusions are evident. Pulmonary vasculature is normal.  IMPRESSION: No acute cardiopulmonary findings   Electronically Signed   By: Andreas Newport M.D.   On: 06/04/2014 06:50    ASSESSMENT/PLAN:  Generalized weakness - continue rehabilitation Embolic stroke - VTE failure on Coumadin; continue Xarelto Chronic Systolic Heart Failure -  S/P ICD Placement CKD, stage III - creatinine 1.44 CAD - continue aspirin 81 mg by mouth daily Pulmonary embolism - PE while on Coumadin; continue Xarelto Bilateral foot pain - apply Biofreeze gel transdermally to bilateral feet twice a day and Tylenol 325 mg 2 tabs= 650 mg by mouth twice a day Diabetes mellitus, type II -  continue NovoLog sliding scale before meals and at bedtime: Hemoglobin A1c 6.5 DVT, BLE - continue Xarelto 15 mg po BID till 06/22/14 and then Xarelto 20 mg PO Q D Hypotension - discontinue Coreg, Aldactone and lisinopril; BP/HR BID X 1 week Hyperlipidemia - continue Lipitor 10 mg by mouth daily Dementia - continue Aricept 5 mg by mouth daily at bedtime   Goals of care:  Short-term rehabilitation  Labs/test ordered:  none     [facility], NP [place]

## 2014-07-02 NOTE — Progress Notes (Signed)
This encounter was created in error - please disregard.

## 2014-07-08 ENCOUNTER — Ambulatory Visit: Payer: Self-pay | Admitting: Neurology

## 2014-07-10 NOTE — Progress Notes (Signed)
Patient ID: Grant Lara, male   DOB: January 31, 1941, 74 y.o.   MRN: [id number]     [facility]   PCP: Lynne Logan, MD  Code Status: full code  No Known Allergies  Chief Complaint  Patient presents with  . New Admit To SNF     HPI:  74 year old patient is here for short term rehabilitation post hospital admission from 05/30/2014- 06/04/2014 with syncope. MRI brian ruled out acute stroke. 2D ECHO:  EF 20-25% with diffuse hypokinesis, no new valvular abnormalities. Cardiology was consulted and he underwent dual chamber ICD placement on 2/18. He has PMH of embolic stroke, DVT, PE, CAD S/P stent, diabetes mellitus type 2 and CKD.  He is seen in his room today. He denies any concerns  Review of Systems:  Constitutional: Negative for fever, chills, diaphoresis.  HENT: Negative for headache, congestion Respiratory: Negative for cough, shortness of breath and wheezing.   Cardiovascular: Negative for chest pain, palpitations, leg swelling.  Gastrointestinal: Negative for heartburn, nausea, vomiting, abdominal pain Genitourinary: Negative for dysuria  Musculoskeletal: Negative for back pain, falls Skin: Negative for itching, rash.  Neurological: Negative for weakness,dizziness Psychiatric/Behavioral: Negative for depression, anxiety  Past Medical History  Diagnosis Date  . Hypertension   . Dilated cardiomyopathy     2/12: EF 30-35%, trivial AI, mild RAE.  EF 2014 40 -45%  . CAD (coronary artery disease)     LHC 9/05 with Dr. Einar Gip:  dLM 20-30%, LAD 85%, oD1 20-30%.  PCI:  Taxus DES to LAD; Dx jailed and tx with POBA.  Last myoview 12/10: inf scar, no ischemia, EF 29%.  . DVT (deep venous thrombosis)   . Pulmonary embolus     chronic coumadin  . Hyperhomocystinemia   . PFO (patent foramen ovale)     Not mentioned on 2014 echo.  [name] HLD (hyperlipidemia)   . Crohn's disease   . Nephrolithiasis   . Diabetes mellitus     11/05/11 "borderline; don't take  medications"  . Stroke 1993    "left arm can't hold steady; leg too"  . Arthritis     "used to have a touch in my legs"  . Memory difficulties   . Pneumonia ~ 2011    09-22-13 denies any recent SOB or breathing problems  . Swelling of both ankles      09-22-13 occ.feet, but denies pain.  . Bradycardia 06/01/2014   Past Surgical History  Procedure Laterality Date  . Colon surgery  1994; 1996    "for Crohn's disease"  . Appendectomy    . Implantable cardioverter defibrillator implant N/A 06/02/2014    Procedure: IMPLANTABLE CARDIOVERTER DEFIBRILLATOR IMPLANT;  Surgeon: [name], MD;  Location: [facility] CATH LAB;  Service: Cardiovascular;  Laterality: N/A;   Social History:   reports that he has been smoking Cigarettes.  He has a 26.5 pack-year smoking history. He has never used smokeless tobacco. He reports that he does not drink alcohol or use illicit drugs.  Family History  Problem Relation Age of Onset  . Diabetes type II Mother   . CAD Father   . Diabetes type II Brother     Medications: Medication reviewed. See MAR  Physical Exam: Filed Vitals:   06/08/14 1625  BP: 110/70  Pulse: 75  Temp: 98.6 F (37 C)  Resp: 16  SpO2: 96%    General- elderly male, thin built, in no acute distress Head- normocephalic, atraumatic Throat- moist mucus membrane Neck-  no cervical lymphadenopathy Cardiovascular- normal s1,s2, no murmurs, palpable dorsalis pedis and radial pulses, no leg edema Respiratory- bilateral clear to auscultation, no wheeze, no rhonchi, no crackles, no use of accessory muscles Abdomen- bowel sounds present, soft, non tender Musculoskeletal- able to move all 4 extremities, generalized weakness, hoyer transfer Neurological- no focal deficit Skin- warm and dry, left chest ICD in place, heel in floaters Psychiatry- alert and oriented to person, place and time, normal mood and affect    Labs reviewed: Basic Metabolic Panel:  Recent Labs  08/22/13 0822   04/16/14 0525  04/18/14 0912  06/02/14 0415 06/03/14 0655 06/04/14 0348  NA 140  < >  --   < >  --   < > 140 137 137  K 4.0  < >  --   < >  --   < > 4.1 4.3 4.1  CL 109  < >  --   < >  --   < > 108 105 103  CO2 20  < >  --   < >  --   < > 26 27 27   GLUCOSE 105*  < >  --   < >  --   < > 76 108* 110*  BUN 22  < >  --   < >  --   < > 14 12 12   CREATININE 1.23  < >  --   < >  --   < > 1.33 1.39* 1.39*  CALCIUM 8.1*  < >  --   < >  --   < > 8.7 8.4 8.5  MG 1.8  --  1.7  --  2.2  --   --   --   --   PHOS 2.0*  --   --   --   --   --   --   --   --   < > = values in this interval not displayed. Liver Function Tests:  Recent Labs  08/22/13 0822 11/19/13 2039 04/15/14 1619  AST 7 9 16   ALT 7 6 11   ALKPHOS 75 84 116  BILITOT 0.9 1.7* 0.6  PROT 5.2* 7.0 6.4  ALBUMIN 2.3* 2.7* 3.4*    Recent Labs  07/13/13 0049 07/14/13 0525  LIPASE 68* 29   No results for input(s): AMMONIA in the last 8760 hours. CBC:  Recent Labs  11/19/13 2039  04/15/14 1619  05/30/14 1739  06/02/14 0415 06/03/14 0655 06/04/14 0348  WBC 12.2*  < > 4.8  --  5.9  < > 6.5 7.0 8.0  NEUTROABS 10.6*  --  2.6  --  4.4  --   --   --   --   HGB 13.5  < > 14.3  < > 11.7*  < > 10.4* 10.5* 10.4*  HCT 39.8  < > 42.9  < > 35.1*  < > 31.7* 31.9* 31.6*  MCV 93.2  < > 92.1  --  90.9  < > 92.7 91.4 90.5  PLT 173  < > 138*  --  163  < > 162 149* 137*  < > = values in this interval not displayed. Cardiac Enzymes:  Recent Labs  04/15/14 1854 04/16/14 0030 04/16/14 0525 06/01/14 0404  CKTOTAL  --   --   --  51  TROPONINI <0.03 0.03 0.03  --    BNP: Invalid input(s): POCBNP CBG:  Recent Labs  06/04/14 0428 06/04/14 0727 06/04/14 1129  GLUCAP 107* 117* 143*  Assessment/Plan  Generalized weakness Will have him work with physical therapy and occupational therapy team to help with gait training and muscle strengthening exercises.fall precautions. Skin care. Encourage to be out of bed.   Syncope  S/p  ICD placement, monitor clinically, encouraged hydration  Chronic Systolic Heart Failure  S/P ICD Placement. continue Coreg 3.125 mg daily, aldactone 12.5 mg daily and lisinopril 2.5 mg daily  Bilateral foot pain Fracture ruled out. apply Biofreeze gel transdermally bid and  Tylenol bid  Embolic stroke Continue xarelto and bp medication with statin, monitor clinically  CAD Remains chest pain free, continue coreg and lisinopril with statin and aspirin  CKD, stage III  Monitor renal function  Pulmonary embolism and DVT continue Xarelto 15 mg bid until 06/22/14 and then 20 mg daily, breathing steady  Diabetes mellitus, type II  Monitor cbg, continue SSI novolog  Hyperlipidemia continue Lipitor 10 mg daily  Dementia  continue Aricept 5 mg daily   Goals of care: short term rehabilitation   Labs/tests ordered: cbc, bmp in 1 week  Family/ staff Communication: reviewed care plan with patient and nursing supervisor    Blanchie Serve, MD  [facility] [phone] (Monday-Friday 8 am - 5 pm) [phone] (afterhours)

## 2014-07-12 ENCOUNTER — Encounter: Payer: Self-pay | Admitting: Neurology

## 2014-07-12 ENCOUNTER — Non-Acute Institutional Stay (SKILLED_NURSING_FACILITY): Payer: Medicare Other | Admitting: Adult Health

## 2014-07-12 ENCOUNTER — Encounter: Payer: Self-pay | Admitting: Adult Health

## 2014-07-12 DIAGNOSIS — I5022 Chronic systolic (congestive) heart failure: Secondary | ICD-10-CM | POA: Diagnosis not present

## 2014-07-12 DIAGNOSIS — E785 Hyperlipidemia, unspecified: Secondary | ICD-10-CM | POA: Diagnosis not present

## 2014-07-12 DIAGNOSIS — N183 Chronic kidney disease, stage 3 unspecified: Secondary | ICD-10-CM

## 2014-07-12 DIAGNOSIS — I82401 Acute embolism and thrombosis of unspecified deep veins of right lower extremity: Secondary | ICD-10-CM | POA: Diagnosis not present

## 2014-07-12 DIAGNOSIS — N189 Chronic kidney disease, unspecified: Secondary | ICD-10-CM | POA: Diagnosis not present

## 2014-07-12 DIAGNOSIS — M79672 Pain in left foot: Secondary | ICD-10-CM

## 2014-07-12 DIAGNOSIS — I639 Cerebral infarction, unspecified: Secondary | ICD-10-CM | POA: Diagnosis not present

## 2014-07-12 DIAGNOSIS — F0150 Vascular dementia without behavioral disturbance: Secondary | ICD-10-CM

## 2014-07-12 DIAGNOSIS — M79604 Pain in right leg: Secondary | ICD-10-CM

## 2014-07-12 DIAGNOSIS — I2699 Other pulmonary embolism without acute cor pulmonale: Secondary | ICD-10-CM

## 2014-07-12 DIAGNOSIS — R531 Weakness: Secondary | ICD-10-CM

## 2014-07-12 DIAGNOSIS — M79605 Pain in left leg: Secondary | ICD-10-CM

## 2014-07-12 DIAGNOSIS — I2510 Atherosclerotic heart disease of native coronary artery without angina pectoris: Secondary | ICD-10-CM

## 2014-07-12 DIAGNOSIS — E1122 Type 2 diabetes mellitus with diabetic chronic kidney disease: Secondary | ICD-10-CM

## 2014-07-12 DIAGNOSIS — M79671 Pain in right foot: Secondary | ICD-10-CM

## 2014-07-12 NOTE — Progress Notes (Signed)
Patient ID: Grant Lara, male   DOB: 02/13/1941, 74 y.o.   MRN: [id number]   07/12/14  Facility:  Nursing Home Location:  [place] Room Number: 1006-2 LEVEL OF CARE:  SNF (31)  Chief Complaint  Patient presents with  . Discharge Notes    Generalized weakness, embolic stroke, chronic systolic CHF, CKD, CAD, bilateral foot pain, hypotension, hyperlipidemia and dementia    HISTORY OF PRESENT ILLNESS:  This is a 74 year old male who is for discharge home with home health PT for ambulation, balance and safety, OT for self care, ST for swallow, language/cognition, Nursing for disease management and home health aide for bathing. He has been admitted to [facility] on 06/04/14 from [facility]. He has PMH of embolic stroke, DVT, PE, CAD S/P stent, diabetes mellitus type 2 and CKD. He had syncopal episode while eating. He was recently hospitalized  On 8/58/85 for embolic stroke with bilateral weakness and dysphagia. He complains of bilateral lower extremity pain and venous duplex done in the hospital showed partially occluding chronic DVT bilaterally and acute thrombus in the superficial leg greater saphenous vein. He is currently on Xarelto.  He was about to be discharged but creatinine noted to be elevated 2.23. IV fluids given and current creatinine 1.44. BP has been noted to be low , 90/60 and 87/44. Aldactone, Lisinopril and Coreg were discontinued due to severe hypotension.  Patient was admitted to this facility for short-term rehabilitation after the patient's recent hospitalization.  Patient has completed SNF rehabilitation and therapy has cleared the patient for discharge.  PAST MEDICAL HISTORY:  Past Medical History  Diagnosis Date  . Hypertension   . Dilated cardiomyopathy     2/12: EF 30-35%, trivial AI, mild RAE.  EF 2014 40 -45%  . CAD (coronary artery disease)     LHC 9/05 with Dr. Einar Gip:  dLM 20-30%, LAD 85%, oD1 20-30%.  PCI:  Taxus DES to  LAD; Dx jailed and tx with POBA.  Last myoview 12/10: inf scar, no ischemia, EF 29%.  . DVT (deep venous thrombosis)   . Pulmonary embolus     chronic coumadin  . Hyperhomocystinemia   . PFO (patent foramen ovale)     Not mentioned on 2014 echo.  [name] HLD (hyperlipidemia)   . Crohn's disease   . Nephrolithiasis   . Diabetes mellitus     11/05/11 "borderline; don't take medications"  . Stroke 1993    "left arm can't hold steady; leg too"  . Arthritis     "used to have a touch in my legs"  . Memory difficulties   . Pneumonia ~ 2011    09-22-13 denies any recent SOB or breathing problems  . Swelling of both ankles      09-22-13 occ.feet, but denies pain.  . Bradycardia 06/01/2014    CURRENT MEDICATIONS: Reviewed per MAR/see medication list  No Known Allergies   REVIEW OF SYSTEMS:  GENERAL: no change in appetite, no fatigue, no weight changes, no fever, chills or weakness RESPIRATORY: no cough, SOB, DOE, wheezing, hemoptysis CARDIAC: no chest pain, edema or palpitations GI: no abdominal pain, diarrhea, constipation, heart burn, nausea or vomiting  PHYSICAL EXAMINATION  GENERAL: no acute distress, normal body habitus NECK: supple, trachea midline, no neck masses, no thyroid tenderness, no thyromegaly RESPIRATORY: breathing is even & unlabored, BS CTAB CARDIAC: RRR, no murmur,no extra heart sounds, no edema, ICD implant GI: abdomen soft, normal BS, no masses, no  tenderness, no hepatomegaly, no splenomegaly EXTREMITIES: Able to move 4 extremities; walks with walker PSYCHIATRIC: the patient is alert & oriented to person, affect & behavior appropriate  LABS/RADIOLOGY: 06/25/14  sodium 135 potassium 5.2 glucose 77 BUN 15 creatinine 1.44 calcium 8.9 06/23/14  WBC 8.5 hemoglobin 10.7 hematocrit 32.6 MCV 91.3 sodium 136 potassium 5.3 glucose 76 BUN 69 creatinine 2.23 calcium 9.1 06/15/14  WBC 8.0 hemoglobin 9.5 hematocrit 29.7 MCV 92.8 sodium 136 potassium 4.9 glucose 91 BUN 38 creatinine  1.41 calcium 8.7   Labs reviewed: Basic Metabolic Panel:  Recent Labs  08/22/13 0822  04/16/14 0525  04/18/14 0912  06/02/14 0415 06/03/14 0655 06/04/14 0348  NA 140  < >  --   < >  --   < > 140 137 137  K 4.0  < >  --   < >  --   < > 4.1 4.3 4.1  CL 109  < >  --   < >  --   < > 108 105 103  CO2 20  < >  --   < >  --   < > 26 27 27   GLUCOSE 105*  < >  --   < >  --   < > 76 108* 110*  BUN 22  < >  --   < >  --   < > 14 12 12   CREATININE 1.23  < >  --   < >  --   < > 1.33 1.39* 1.39*  CALCIUM 8.1*  < >  --   < >  --   < > 8.7 8.4 8.5  MG 1.8  --  1.7  --  2.2  --   --   --   --   PHOS 2.0*  --   --   --   --   --   --   --   --   < > = values in this interval not displayed. Liver Function Tests:  Recent Labs  08/22/13 0822 11/19/13 2039 04/15/14 1619  AST 7 9 16   ALT 7 6 11   ALKPHOS 75 84 116  BILITOT 0.9 1.7* 0.6  PROT 5.2* 7.0 6.4  ALBUMIN 2.3* 2.7* 3.4*    Recent Labs  07/13/13 0049 07/14/13 0525  LIPASE 68* 29   CBC:  Recent Labs  11/19/13 2039  04/15/14 1619  05/30/14 1739  06/02/14 0415 06/03/14 0655 06/04/14 0348  WBC 12.2*  < > 4.8  --  5.9  < > 6.5 7.0 8.0  NEUTROABS 10.6*  --  2.6  --  4.4  --   --   --   --   HGB 13.5  < > 14.3  < > 11.7*  < > 10.4* 10.5* 10.4*  HCT 39.8  < > 42.9  < > 35.1*  < > 31.7* 31.9* 31.6*  MCV 93.2  < > 92.1  --  90.9  < > 92.7 91.4 90.5  PLT 173  < > 138*  --  163  < > 162 149* 137*  < > = values in this interval not displayed.  Lipid Panel:  Recent Labs  04/16/14 0030  HDL 48   Cardiac Enzymes:  Recent Labs  04/15/14 1854 04/16/14 0030 04/16/14 0525 06/01/14 0404  CKTOTAL  --   --   --  51  TROPONINI <0.03 0.03 0.03  --    CBG:  Recent Labs  06/04/14 0428 06/04/14 0727 06/04/14 1129  GLUCAP 107* 117* 143*    No results found.  ASSESSMENT/PLAN:  Generalized weakness - home health PT, OT, ST, Nursing and home health aide Embolic stroke - VTE failure on Coumadin; continue Xarelto Chronic  Systolic Heart Failure -  S/P ICD Placement; stable CKD, stage III - creatinine 1.44 CAD - continue aspirin 81 mg by mouth daily Pulmonary embolism - PE while on Coumadin; continue Xarelto Bilateral foot pain - apply Biofreeze gel transdermally to bilateral feet twice a day and Tylenol 325 mg 2 tabs= 650 mg by mouth twice a day Diabetes mellitus, type II -  continue NovoLog sliding scale before meals and at bedtime: Hemoglobin A1c 6.5 DVT, BLE - continue Xarelto 20 mg PO Q D Hypotension - recently discontinued Coreg, Aldactone and lisinopri Hyperlipidemia - continue Lipitor 10 mg by mouth daily Dementia - continue Aricept 5 mg by mouth daily at bedtime    I have filled out patient's discharge paperwork and written prescriptions.  Patient will receive home health PT, OT, ST, Nursing and home health aide.  Total discharge time: Greater than 30 minutes  Discharge time involved coordination of the discharge process with social worker, nursing staff and therapy department. Medical justification for home health services verified.   [facility] NP [employer] [phone]

## 2014-07-13 ENCOUNTER — Encounter: Payer: Self-pay | Admitting: Internal Medicine

## 2014-07-15 ENCOUNTER — Emergency Department (HOSPITAL_COMMUNITY): Payer: Medicare Other

## 2014-07-15 ENCOUNTER — Inpatient Hospital Stay (HOSPITAL_COMMUNITY)
Admission: EM | Admit: 2014-07-15 | Discharge: 2014-07-19 | DRG: 057 | Disposition: A | Discharge status: Home / Self Care | Payer: Medicare Other | Attending: Family Medicine | Admitting: Family Medicine

## 2014-07-15 ENCOUNTER — Encounter (HOSPITAL_COMMUNITY): Payer: Self-pay | Admitting: Emergency Medicine

## 2014-07-15 DIAGNOSIS — R6510 Systemic inflammatory response syndrome (SIRS) of non-infectious origin without acute organ dysfunction: Secondary | ICD-10-CM | POA: Diagnosis present

## 2014-07-15 DIAGNOSIS — N400 Enlarged prostate without lower urinary tract symptoms: Secondary | ICD-10-CM | POA: Diagnosis present

## 2014-07-15 DIAGNOSIS — Z95810 Presence of automatic (implantable) cardiac defibrillator: Secondary | ICD-10-CM

## 2014-07-15 DIAGNOSIS — Z7901 Long term (current) use of anticoagulants: Secondary | ICD-10-CM

## 2014-07-15 DIAGNOSIS — T68XXXA Hypothermia, initial encounter: Secondary | ICD-10-CM | POA: Diagnosis present

## 2014-07-15 DIAGNOSIS — I69354 Hemiplegia and hemiparesis following cerebral infarction affecting left non-dominant side: Principal | ICD-10-CM

## 2014-07-15 DIAGNOSIS — Z7982 Long term (current) use of aspirin: Secondary | ICD-10-CM

## 2014-07-15 DIAGNOSIS — I2510 Atherosclerotic heart disease of native coronary artery without angina pectoris: Secondary | ICD-10-CM | POA: Diagnosis present

## 2014-07-15 DIAGNOSIS — Z955 Presence of coronary angioplasty implant and graft: Secondary | ICD-10-CM

## 2014-07-15 DIAGNOSIS — I2699 Other pulmonary embolism without acute cor pulmonale: Secondary | ICD-10-CM | POA: Diagnosis present

## 2014-07-15 DIAGNOSIS — Z79899 Other long term (current) drug therapy: Secondary | ICD-10-CM

## 2014-07-15 DIAGNOSIS — I951 Orthostatic hypotension: Secondary | ICD-10-CM | POA: Diagnosis present

## 2014-07-15 DIAGNOSIS — R55 Syncope and collapse: Secondary | ICD-10-CM | POA: Diagnosis not present

## 2014-07-15 DIAGNOSIS — F0390 Unspecified dementia without behavioral disturbance: Secondary | ICD-10-CM | POA: Diagnosis present

## 2014-07-15 DIAGNOSIS — F17210 Nicotine dependence, cigarettes, uncomplicated: Secondary | ICD-10-CM | POA: Diagnosis present

## 2014-07-15 DIAGNOSIS — T17908A Unspecified foreign body in respiratory tract, part unspecified causing other injury, initial encounter: Secondary | ICD-10-CM

## 2014-07-15 DIAGNOSIS — N183 Chronic kidney disease, stage 3 (moderate): Secondary | ICD-10-CM | POA: Diagnosis present

## 2014-07-15 DIAGNOSIS — I69920 Aphasia following unspecified cerebrovascular disease: Secondary | ICD-10-CM

## 2014-07-15 DIAGNOSIS — E11649 Type 2 diabetes mellitus with hypoglycemia without coma: Secondary | ICD-10-CM | POA: Diagnosis present

## 2014-07-15 DIAGNOSIS — E119 Type 2 diabetes mellitus without complications: Secondary | ICD-10-CM

## 2014-07-15 DIAGNOSIS — J449 Chronic obstructive pulmonary disease, unspecified: Secondary | ICD-10-CM | POA: Diagnosis present

## 2014-07-15 DIAGNOSIS — Q211 Atrial septal defect: Secondary | ICD-10-CM

## 2014-07-15 DIAGNOSIS — I959 Hypotension, unspecified: Secondary | ICD-10-CM | POA: Diagnosis present

## 2014-07-15 DIAGNOSIS — I129 Hypertensive chronic kidney disease with stage 1 through stage 4 chronic kidney disease, or unspecified chronic kidney disease: Secondary | ICD-10-CM | POA: Diagnosis present

## 2014-07-15 DIAGNOSIS — Z794 Long term (current) use of insulin: Secondary | ICD-10-CM

## 2014-07-15 DIAGNOSIS — I420 Dilated cardiomyopathy: Secondary | ICD-10-CM | POA: Diagnosis present

## 2014-07-15 DIAGNOSIS — Z86711 Personal history of pulmonary embolism: Secondary | ICD-10-CM

## 2014-07-15 DIAGNOSIS — K5090 Crohn's disease, unspecified, without complications: Secondary | ICD-10-CM | POA: Diagnosis present

## 2014-07-15 DIAGNOSIS — I5022 Chronic systolic (congestive) heart failure: Secondary | ICD-10-CM | POA: Diagnosis present

## 2014-07-15 DIAGNOSIS — I6990 Unspecified sequelae of unspecified cerebrovascular disease: Secondary | ICD-10-CM

## 2014-07-15 DIAGNOSIS — J189 Pneumonia, unspecified organism: Secondary | ICD-10-CM

## 2014-07-15 DIAGNOSIS — E785 Hyperlipidemia, unspecified: Secondary | ICD-10-CM | POA: Diagnosis present

## 2014-07-15 DIAGNOSIS — R680 Hypothermia, not associated with low environmental temperature: Secondary | ICD-10-CM | POA: Diagnosis present

## 2014-07-15 HISTORY — DX: Unspecified dementia, unspecified severity, without behavioral disturbance, psychotic disturbance, mood disturbance, and anxiety: F03.90

## 2014-07-15 LAB — I-STAT CG4 LACTIC ACID, ED: Lactic Acid, Venous: 3.1 mmol/L (ref 0.5–2.0)

## 2014-07-15 MED ORDER — SODIUM CHLORIDE 0.9 % IV SOLN
Freq: Once | INTRAVENOUS | Status: AC
  Administered 2014-07-15: 23:00:00 via INTRAVENOUS

## 2014-07-15 MED ORDER — VANCOMYCIN HCL IN DEXTROSE 1-5 GM/200ML-% IV SOLN
1000.0000 mg | Freq: Once | INTRAVENOUS | Status: AC
  Administered 2014-07-16: 1000 mg via INTRAVENOUS
  Filled 2014-07-15: qty 200

## 2014-07-15 MED ORDER — PIPERACILLIN-TAZOBACTAM 3.375 G IVPB
3.3750 g | Freq: Once | INTRAVENOUS | Status: AC
  Administered 2014-07-16: 3.375 g via INTRAVENOUS
  Filled 2014-07-15: qty 50

## 2014-07-15 NOTE — ED Notes (Signed)
Per EMS, pt from home had a witnessed near syncopal episode. Pt was walking out of bathroom when he became diaphoretic and stumbled, but per family did not fall. Pt was released from [facility] yesterday for rehab from a stroke. Per EMS, pt was hypotensive with systolic in 54O. Pt BP increased to 95/50 after 54m bolus. Pt AO at baseline. Pt has hx of dementia. NAD noted. CBG 339. HR 58-62 paced. Pt also c/o pain to LT knee and leg. Swelling noted to LT knee.

## 2014-07-15 NOTE — ED Provider Notes (Signed)
CSN: [id number]     Arrival date & time 07/15/14  2204 History   First MD Initiated Contact with Patient 07/15/14 2226     Chief Complaint  Patient presents with  . Hypotension  . 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 Per the family patient was sitting at the table went to stand up and then past out. They were able to catch the patient before he collapsed to the ground. Patient denies any complaints currently. Patient is a level V caveat d/t dementia.    Past Medical History  Diagnosis Date  . Hypertension   . Dilated cardiomyopathy     2/12: EF 30-35%, trivial AI, mild RAE.  EF 2014 40 -45%  . CAD (coronary artery disease)     LHC 9/05 with Dr. Einar Gip:  dLM 20-30%, LAD 85%, oD1 20-30%.  PCI:  Taxus DES to LAD; Dx jailed and tx with POBA.  Last myoview 12/10: inf scar, no ischemia, EF 29%.  . DVT (deep venous thrombosis)   . Pulmonary embolus     chronic coumadin  . Hyperhomocystinemia   . PFO (patent foramen ovale)     Not mentioned on 2014 echo.  [name] HLD (hyperlipidemia)   . Crohn's disease   . Nephrolithiasis   . Diabetes mellitus     11/05/11 "borderline; don't take medications"  . Stroke 1993    "left arm can't hold steady; leg too"  . Arthritis     "used to have a touch in my legs"  . Memory difficulties   . Pneumonia ~ 2011    09-22-13 denies any recent SOB or breathing problems  . Swelling of both ankles      09-22-13 occ.feet, but denies pain.  . Bradycardia 06/01/2014  . Dementia    Past Surgical History  Procedure Laterality Date  .  Colon surgery  1994; 1996    "for Crohn's disease"  . Appendectomy    . Implantable cardioverter defibrillator implant N/A 06/02/2014    Procedure: IMPLANTABLE CARDIOVERTER DEFIBRILLATOR IMPLANT;  Surgeon: [name], MD;  Location: [facility] CATH LAB;  Service: Cardiovascular;  Laterality: N/A;   Family History  Problem Relation Age of Onset  . Diabetes type II Mother   . CAD Father   . Diabetes type II Brother    History  Substance Use Topics  . Smoking status: Current Every Day Smoker -- 0.50 packs/day for 53 years    Types: Cigarettes  . Smokeless tobacco: Never Used  . Alcohol Use: No    Review of Systems  Unable to perform ROS     Allergies  Review of patient's allergies indicates no known allergies.  Home Medications   Prior to Admission medications   Medication Sig Start Date End Date Taking? Authorizing Provider  acetaminophen (TYLENOL) 325 MG tablet Take 2 tablets (650 mg total) by mouth every 6 (six) hours as needed for mild pain, moderate pain or fever. 11/23/13   Modena Jansky, MD  aspirin EC 81 MG EC tablet Take 1 tablet (81 mg total) by mouth daily. 04/18/14   Maryann Mikhail, DO  atorvastatin (LIPITOR) 10 MG tablet Take 1 tablet (10 mg total) by mouth  daily at 6 PM. 04/18/14   Maryann Mikhail, DO  donepezil (ARICEPT) 5 MG tablet Take 5 mg by mouth at bedtime.    Historical Provider, MD  glucose monitoring kit (FREESTYLE) monitoring kit 1 each by Does not apply route as needed for other. Please give 100 lancets and testing strips.  Check sugars every morning before breakfast and every evening before dinner 08/23/13   Debbe Odea, MD  insulin aspart (NOVOLOG) 100 UNIT/ML injection CBG 70 - 120: 0 units CBG 121 - 150: 1 unit CBG 151 - 200: 2 units CBG 201 - 250: 3 units CBG 251 - 300: 5 units CBG 301 - 350: 7 units CBG 351 - 400: 9 units 06/04/14   Hosie Poisson, MD  latanoprost (XALATAN) 0.005 % ophthalmic solution Place 1 drop into both eyes at bedtime. 06/29/13    Historical Provider, MD  rivaroxaban (XARELTO) 20 MG TABS tablet Take 1 tablet (20 mg total) by mouth daily with supper. 06/23/14   Hosie Poisson, MD  tamsulosin (FLOMAX) 0.4 MG CAPS capsule Take 1 capsule (0.4 mg total) by mouth daily. 04/18/14   Maryann Mikhail, DO   BP 111/55 mmHg  Pulse 60  Temp(Src) 98.3 F (36.8 C) (Oral)  Resp 9  Ht _0  (1.753 m)  Wt 117 lb 8.1 oz (53.3 kg)  BMI 17.34 kg/m2  SpO2 100% Physical Exam  Constitutional: He appears well-developed and well-nourished.  HENT:  Head: Normocephalic and atraumatic.  Right Ear: External ear normal.  Left Ear: External ear normal.  Eyes: Conjunctivae are normal.  Neck: Neck supple.  Cardiovascular: Normal rate, regular rhythm and normal heart sounds.   Pulmonary/Chest: Effort normal and breath sounds normal.  Abdominal: Soft. There is no tenderness.  Musculoskeletal:  Moves all extremities without ataxia.   Neurological: He is alert.  Oriented at baseline. Strength at baseline  Skin: Skin is warm and dry.    ED Course  Procedures (including critical care time) Medications  vancomycin (VANCOCIN) IVPB 750 mg/150 ml premix (not administered)  piperacillin-tazobactam (ZOSYN) IVPB 3.375 g (not administered)  rivaroxaban (XARELTO) tablet 20 mg (not administered)  aspirin EC tablet 81 mg (not administered)  atorvastatin (LIPITOR) tablet 10 mg (not administered)  tamsulosin (FLOMAX) capsule 0.4 mg (not administered)  donepezil (ARICEPT) tablet 5 mg (not administered)  latanoprost (XALATAN) 0.005 % ophthalmic solution 1 drop (not administered)  0.9 %  sodium chloride infusion ( Intravenous Duplicate 10/17/71 4037)  acetaminophen (TYLENOL) tablet 650 mg (not administered)    Or  acetaminophen (TYLENOL) suppository 650 mg (not administered)  oxyCODONE (Oxy IR/ROXICODONE) immediate release tablet 5 mg (not administered)  HYDROmorphone (DILAUDID) injection 0.5-1 mg (not administered)  ondansetron (ZOFRAN) tablet 4 mg (not  administered)    Or  ondansetron (ZOFRAN) injection 4 mg (not administered)  alum & mag hydroxide-simeth (MAALOX/MYLANTA) 200-200-20 MG/5ML suspension 30 mL (not administered)  0.9 %  sodium chloride infusion ( Intravenous Transfusing/Transfer 07/16/14 0305)  vancomycin (VANCOCIN) IVPB 1000 mg/200 mL premix (0 mg Intravenous Stopped 07/16/14 0245)  piperacillin-tazobactam (ZOSYN) IVPB 3.375 g (0 g Intravenous Stopped 07/16/14 0139)    Labs Review Labs Reviewed  CBC WITH DIFFERENTIAL/PLATELET - Abnormal; Notable for the following:    RBC 2.76 (*)    Hemoglobin 8.4 (*)    HCT 26.1 (*)    Neutrophils Relative % 83 (*)    Lymphocytes Relative 11 (*)    All other components within normal limits  COMPREHENSIVE METABOLIC PANEL - Abnormal; Notable for the following:  Glucose, Bld 279 (*)    Creatinine, Ser 1.50 (*)    Total Protein 5.3 (*)    Albumin 2.6 (*)    GFR calc non Af Amer 44 (*)    GFR calc Af Amer 51 (*)    Anion gap 3 (*)    All other components within normal limits  URINALYSIS, ROUTINE W REFLEX MICROSCOPIC - Abnormal; Notable for the following:    Color, Urine AMBER (*)    APPearance CLOUDY (*)    Specific Gravity, Urine 1.031 (*)    Glucose, UA 250 (*)    Hgb urine dipstick MODERATE (*)    Bilirubin Urine SMALL (*)    Ketones, ur 15 (*)    Protein, ur 30 (*)    All other components within normal limits  URINE MICROSCOPIC-ADD ON - Abnormal; Notable for the following:    Casts HYALINE CASTS (*)    All other components within normal limits  BASIC METABOLIC PANEL - Abnormal; Notable for the following:    Chloride 113 (*)    Glucose, Bld 171 (*)    Creatinine, Ser 1.36 (*)    GFR calc non Af Amer 49 (*)    GFR calc Af Amer 57 (*)    Anion gap 0.0 (*)    All other components within normal limits  CBC - Abnormal; Notable for the following:    RBC 2.64 (*)    Hemoglobin 8.0 (*)    HCT 25.2 (*)    All other components within normal limits  I-STAT CG4 LACTIC ACID, ED -  Abnormal; Notable for the following:    Lactic Acid, Venous 3.10 (*)    All other components within normal limits  I-STAT CG4 LACTIC ACID, ED - Abnormal; Notable for the following:    Lactic Acid, Venous 2.04 (*)    All other components within normal limits  MRSA PCR SCREENING  CULTURE, BLOOD (ROUTINE X 2)  CULTURE, BLOOD (ROUTINE X 2)  URINE CULTURE  TROPONIN I  TROPONIN I  TROPONIN I  TROPONIN I    Imaging Review Dg Chest 2 View  07/16/2014   CLINICAL DATA:  Near syncopal episode when coming out of bathroom. No fall. Recent stroke.  EXAM: CHEST  2 VIEW  COMPARISON:  Chest radiograph June 04, 2014  FINDINGS: Cardiac silhouette is mildly enlarged, mediastinal silhouette is nonsuspicious. Dual lead LEFT cardiac defibrillator. No pleural effusions or focal consolidations. No pneumothorax. Soft tissue planes and included osseous structures are nonsuspicious.  IMPRESSION: Mild cardiomegaly, no acute pulmonary process.   Electronically Signed   By: Elon Alas   On: 07/16/2014 00:03     EKG Interpretation   Date/Time:  Friday July 15 2014 22:19:39 EDT Ventricular Rate:  61 PR Interval:  149 QRS Duration: 110 QT Interval:  419 QTC Calculation: 422 R Axis:   69 Text Interpretation:  Sinus rhythm Atrial premature complex LVH with  secondary repolarization abnormality Anterior ST elevation, probably due  to LVH similar to previous Confirmed by RANCOUR  MD, Exira [phone]) on  07/15/2014 11:27:46 PM Also confirmed by WARD,  DO, KRISTEN ([id number])  on  07/15/2014 11:28:39 PM      MDM   Final diagnoses:  Syncope    Filed Vitals:   07/16/14 0500  BP: 111/55  Pulse: 60  Temp:   Resp: 9   I have reviewed nursing notes, vital signs, and all appropriate lab and imaging results for this patient.  Patient presents to the emergency department  for a syncopal episode. Noted to be hypotensive by EMS, improved but still hypotensive upon arrival to the emergency department. Patient is  noted to be hypothermic rectal temperature. No gross neural focal deficits on examination or other acute abnormalities noted. Lactic acid is elevated. Blood cultures obtained and broad-spectrum IV antibiotics administered. Hyperthermia responded with bear hugger administration. Chest x-ray reviewed. UA reviewed. Labs reviewed. Patient will be admitted to the hospital for further evaluation of syncopal episode along with hyperthermia and increased lactic acid. Patient d/w with Dr. Leonides Schanz, agrees with plan.      Baron Sane, PA-C 07/16/14 [phone]

## 2014-07-15 NOTE — ED Provider Notes (Signed)
Medical screening examination/treatment/procedure(s) were conducted as a shared visit with non-physician practitioner(s) and myself.  I personally evaluated the patient during the encounter.   EKG Interpretation   Date/Time:  Friday July 15 2014 22:19:39 EDT Ventricular Rate:  61 PR Interval:  149 QRS Duration: 110 QT Interval:  419 QTC Calculation: 422 R Axis:   69 Text Interpretation:  Sinus rhythm Atrial premature complex LVH with  secondary repolarization abnormality Anterior ST elevation, probably due  to LVH similar to previous Confirmed by Wyvonnia Dusky  MD, STEPHEN [phone]) on  07/15/2014 11:27:46 PM Also confirmed by Leonides Schanz,  DO, Tamanika Heiney ([id number])  on  07/15/2014 11:28:39 PM      Pt is a 74 y.o. male with history of hypertension, dilated cardiomyopathy, CAD, PE and DVT on chronic Xarelto, prior CVA, dementia who presents emergency room with his daughter after he had a near syncopal event and had most found to be hypotensive with EMS. Patient cannot tell us any the events that occurred denies any pain or has no complaints currently. Appears at his neurologic baseline. Heart and lung sounds normal. Abdominal exam benign.  Patient is hypotensive and hypothermic. Given broad-spectrum Biaxin cultures obtained. Admitted to medicine.  Novinger, DO 07/16/14 [phone]

## 2014-07-15 NOTE — Progress Notes (Signed)
ANTIBIOTIC CONSULT NOTE - INITIAL  Pharmacy Consult for Vancomycin/Zosyn  Indication: rule out sepsis  No Known Allergies  Patient Measurements: ~52 kg  Vital Signs: Temp: 96.2 F (35.7 C) (04/01 2343) Temp Source: Rectal (04/01 2343) BP: 101/52 mmHg (04/01 2343) Pulse Rate: 61 (04/01 2343)  Medical History: Past Medical History  Diagnosis Date  . Hypertension   . Dilated cardiomyopathy     2/12: EF 30-35%, trivial AI, mild RAE.  EF 2014 40 -45%  . CAD (coronary artery disease)     LHC 9/05 with Dr. Einar Gip:  dLM 20-30%, LAD 85%, oD1 20-30%.  PCI:  Taxus DES to LAD; Dx jailed and tx with POBA.  Last myoview 12/10: inf scar, no ischemia, EF 29%.  . DVT (deep venous thrombosis)   . Pulmonary embolus     chronic coumadin  . Hyperhomocystinemia   . PFO (patent foramen ovale)     Not mentioned on 2014 echo.  [name] HLD (hyperlipidemia)   . Crohn's disease   . Nephrolithiasis   . Diabetes mellitus     11/05/11 "borderline; don't take medications"  . Stroke 1993    "left arm can't hold steady; leg too"  . Arthritis     "used to have a touch in my legs"  . Memory difficulties   . Pneumonia ~ 2011    09-22-13 denies any recent SOB or breathing problems  . Swelling of both ankles      09-22-13 occ.feet, but denies pain.  . Bradycardia 06/01/2014  . Dementia     Assessment: 74 y/o M here with syncopal episode, lactic acid elevated, WBC WNL, renal dysfunction noted.   Goal of Therapy:  Vancomycin trough level 15-20 mcg/ml  Plan:  -Vancomycin 750 mg IV q24h -Zosyn 3.375G IV q8h to be infused over 4 hours -Trend WBC, temp, renal function  -Drug levels as indicated   Narda Bonds 07/15/2014,11:57 PM

## 2014-07-15 NOTE — ED Notes (Signed)
Dr Leonides Schanz given a copy of lactic acid results 3.10

## 2014-07-15 NOTE — ED Notes (Signed)
MD Ward at bedside.

## 2014-07-15 NOTE — ED Notes (Signed)
Phlebotomy at bedside.

## 2014-07-16 ENCOUNTER — Inpatient Hospital Stay (HOSPITAL_COMMUNITY): Payer: Medicare Other

## 2014-07-16 DIAGNOSIS — Z95810 Presence of automatic (implantable) cardiac defibrillator: Secondary | ICD-10-CM | POA: Diagnosis not present

## 2014-07-16 DIAGNOSIS — K5090 Crohn's disease, unspecified, without complications: Secondary | ICD-10-CM | POA: Diagnosis present

## 2014-07-16 DIAGNOSIS — Z86711 Personal history of pulmonary embolism: Secondary | ICD-10-CM | POA: Diagnosis not present

## 2014-07-16 DIAGNOSIS — Z79899 Other long term (current) drug therapy: Secondary | ICD-10-CM | POA: Diagnosis not present

## 2014-07-16 DIAGNOSIS — I959 Hypotension, unspecified: Secondary | ICD-10-CM | POA: Diagnosis not present

## 2014-07-16 DIAGNOSIS — E11649 Type 2 diabetes mellitus with hypoglycemia without coma: Secondary | ICD-10-CM | POA: Diagnosis present

## 2014-07-16 DIAGNOSIS — Z7982 Long term (current) use of aspirin: Secondary | ICD-10-CM | POA: Diagnosis not present

## 2014-07-16 DIAGNOSIS — I951 Orthostatic hypotension: Secondary | ICD-10-CM | POA: Diagnosis present

## 2014-07-16 DIAGNOSIS — I5022 Chronic systolic (congestive) heart failure: Secondary | ICD-10-CM | POA: Diagnosis not present

## 2014-07-16 DIAGNOSIS — I420 Dilated cardiomyopathy: Secondary | ICD-10-CM | POA: Diagnosis present

## 2014-07-16 DIAGNOSIS — I2510 Atherosclerotic heart disease of native coronary artery without angina pectoris: Secondary | ICD-10-CM | POA: Diagnosis not present

## 2014-07-16 DIAGNOSIS — Q211 Atrial septal defect: Secondary | ICD-10-CM | POA: Diagnosis not present

## 2014-07-16 DIAGNOSIS — T68XXXD Hypothermia, subsequent encounter: Secondary | ICD-10-CM | POA: Diagnosis not present

## 2014-07-16 DIAGNOSIS — K50919 Crohn's disease, unspecified, with unspecified complications: Secondary | ICD-10-CM | POA: Diagnosis not present

## 2014-07-16 DIAGNOSIS — R55 Syncope and collapse: Secondary | ICD-10-CM | POA: Diagnosis not present

## 2014-07-16 DIAGNOSIS — I6990 Unspecified sequelae of unspecified cerebrovascular disease: Secondary | ICD-10-CM

## 2014-07-16 DIAGNOSIS — Z955 Presence of coronary angioplasty implant and graft: Secondary | ICD-10-CM | POA: Diagnosis not present

## 2014-07-16 DIAGNOSIS — I69354 Hemiplegia and hemiparesis following cerebral infarction affecting left non-dominant side: Secondary | ICD-10-CM | POA: Diagnosis not present

## 2014-07-16 DIAGNOSIS — K50912 Crohn's disease, unspecified, with intestinal obstruction: Secondary | ICD-10-CM | POA: Diagnosis not present

## 2014-07-16 DIAGNOSIS — R6510 Systemic inflammatory response syndrome (SIRS) of non-infectious origin without acute organ dysfunction: Secondary | ICD-10-CM | POA: Diagnosis present

## 2014-07-16 DIAGNOSIS — T68XXXA Hypothermia, initial encounter: Secondary | ICD-10-CM | POA: Diagnosis present

## 2014-07-16 DIAGNOSIS — Z7901 Long term (current) use of anticoagulants: Secondary | ICD-10-CM | POA: Diagnosis not present

## 2014-07-16 DIAGNOSIS — F0390 Unspecified dementia without behavioral disturbance: Secondary | ICD-10-CM | POA: Diagnosis present

## 2014-07-16 DIAGNOSIS — I69920 Aphasia following unspecified cerebrovascular disease: Secondary | ICD-10-CM | POA: Diagnosis not present

## 2014-07-16 DIAGNOSIS — I129 Hypertensive chronic kidney disease with stage 1 through stage 4 chronic kidney disease, or unspecified chronic kidney disease: Secondary | ICD-10-CM | POA: Diagnosis present

## 2014-07-16 DIAGNOSIS — G903 Multi-system degeneration of the autonomic nervous system: Secondary | ICD-10-CM | POA: Diagnosis not present

## 2014-07-16 DIAGNOSIS — E785 Hyperlipidemia, unspecified: Secondary | ICD-10-CM | POA: Diagnosis present

## 2014-07-16 DIAGNOSIS — J449 Chronic obstructive pulmonary disease, unspecified: Secondary | ICD-10-CM | POA: Diagnosis present

## 2014-07-16 DIAGNOSIS — N183 Chronic kidney disease, stage 3 (moderate): Secondary | ICD-10-CM | POA: Diagnosis present

## 2014-07-16 DIAGNOSIS — F17210 Nicotine dependence, cigarettes, uncomplicated: Secondary | ICD-10-CM | POA: Diagnosis present

## 2014-07-16 DIAGNOSIS — I2699 Other pulmonary embolism without acute cor pulmonale: Secondary | ICD-10-CM | POA: Diagnosis present

## 2014-07-16 DIAGNOSIS — N400 Enlarged prostate without lower urinary tract symptoms: Secondary | ICD-10-CM | POA: Diagnosis present

## 2014-07-16 DIAGNOSIS — A419 Sepsis, unspecified organism: Secondary | ICD-10-CM | POA: Diagnosis not present

## 2014-07-16 DIAGNOSIS — I9589 Other hypotension: Secondary | ICD-10-CM

## 2014-07-16 DIAGNOSIS — Z794 Long term (current) use of insulin: Secondary | ICD-10-CM | POA: Diagnosis not present

## 2014-07-16 DIAGNOSIS — R680 Hypothermia, not associated with low environmental temperature: Secondary | ICD-10-CM | POA: Diagnosis present

## 2014-07-16 LAB — URINE MICROSCOPIC-ADD ON

## 2014-07-16 LAB — BASIC METABOLIC PANEL
Anion gap: 0 — ABNORMAL LOW (ref 5–15)
BUN: 19 mg/dL (ref 6–23)
CALCIUM: 8.5 mg/dL (ref 8.4–10.5)
CO2: 28 mmol/L (ref 19–32)
Chloride: 113 mmol/L — ABNORMAL HIGH (ref 96–112)
Creatinine, Ser: 1.36 mg/dL — ABNORMAL HIGH (ref 0.50–1.35)
GFR calc non Af Amer: 49 mL/min — ABNORMAL LOW (ref >90)
GFR, EST AFRICAN AMERICAN: 57 mL/min — AB (ref >90)
Glucose, Bld: 171 mg/dL — ABNORMAL HIGH (ref 70–99)
POTASSIUM: 4 mmol/L (ref 3.5–5.1)
Sodium: 140 mmol/L (ref 135–145)

## 2014-07-16 LAB — CBC WITH DIFFERENTIAL/PLATELET
BASOS ABS: 0 10*3/uL (ref 0.0–0.1)
Basophils Relative: 0 % (ref 0–1)
EOS ABS: 0.1 10*3/uL (ref 0.0–0.7)
EOS PCT: 1 % (ref 0–5)
HCT: 26.1 % — ABNORMAL LOW (ref 39.0–52.0)
Hemoglobin: 8.4 g/dL — ABNORMAL LOW (ref 13.0–17.0)
Lymphocytes Relative: 11 % — ABNORMAL LOW (ref 12–46)
Lymphs Abs: 0.9 10*3/uL (ref 0.7–4.0)
MCH: 30.4 pg (ref 26.0–34.0)
MCHC: 32.2 g/dL (ref 30.0–36.0)
MCV: 94.6 fL (ref 78.0–100.0)
Monocytes Absolute: 0.4 10*3/uL (ref 0.1–1.0)
Monocytes Relative: 5 % (ref 3–12)
NEUTROS PCT: 83 % — AB (ref 43–77)
Neutro Abs: 6.5 10*3/uL (ref 1.7–7.7)
PLATELETS: 195 10*3/uL (ref 150–400)
RBC: 2.76 MIL/uL — ABNORMAL LOW (ref 4.22–5.81)
RDW: 14.8 % (ref 11.5–15.5)
WBC: 7.9 10*3/uL (ref 4.0–10.5)

## 2014-07-16 LAB — URINALYSIS, ROUTINE W REFLEX MICROSCOPIC
GLUCOSE, UA: 250 mg/dL — AB
Ketones, ur: 15 mg/dL — AB
Leukocytes, UA: NEGATIVE
NITRITE: NEGATIVE
Protein, ur: 30 mg/dL — AB
SPECIFIC GRAVITY, URINE: 1.031 — AB (ref 1.005–1.030)
UROBILINOGEN UA: 0.2 mg/dL (ref 0.0–1.0)
pH: 5 (ref 5.0–8.0)

## 2014-07-16 LAB — GLUCOSE, CAPILLARY
Glucose-Capillary: 121 mg/dL — ABNORMAL HIGH (ref 70–99)
Glucose-Capillary: 64 mg/dL — ABNORMAL LOW (ref 70–99)
Glucose-Capillary: 87 mg/dL (ref 70–99)
Glucose-Capillary: 113 mg/dL — ABNORMAL HIGH (ref 70–99)
Glucose-Capillary: 138 mg/dL — ABNORMAL HIGH (ref 70–99)

## 2014-07-16 LAB — COMPREHENSIVE METABOLIC PANEL
ALT: 51 U/L (ref 0–53)
ANION GAP: 3 — AB (ref 5–15)
AST: 28 U/L (ref 0–37)
Albumin: 2.6 g/dL — ABNORMAL LOW (ref 3.5–5.2)
Alkaline Phosphatase: 92 U/L (ref 39–117)
BUN: 20 mg/dL (ref 6–23)
CALCIUM: 8.5 mg/dL (ref 8.4–10.5)
CO2: 26 mmol/L (ref 19–32)
Chloride: 109 mmol/L (ref 96–112)
Creatinine, Ser: 1.5 mg/dL — ABNORMAL HIGH (ref 0.50–1.35)
GFR calc Af Amer: 51 mL/min — ABNORMAL LOW (ref >90)
GFR calc non Af Amer: 44 mL/min — ABNORMAL LOW (ref >90)
Glucose, Bld: 279 mg/dL — ABNORMAL HIGH (ref 70–99)
POTASSIUM: 4.4 mmol/L (ref 3.5–5.1)
Sodium: 138 mmol/L (ref 135–145)
Total Bilirubin: 0.4 mg/dL (ref 0.3–1.2)
Total Protein: 5.3 g/dL — ABNORMAL LOW (ref 6.0–8.3)

## 2014-07-16 LAB — CBC
HCT: 25.2 % — ABNORMAL LOW (ref 39.0–52.0)
Hemoglobin: 8 g/dL — ABNORMAL LOW (ref 13.0–17.0)
MCH: 30.3 pg (ref 26.0–34.0)
MCHC: 31.7 g/dL (ref 30.0–36.0)
MCV: 95.5 fL (ref 78.0–100.0)
Platelets: 179 10*3/uL (ref 150–400)
RBC: 2.64 MIL/uL — AB (ref 4.22–5.81)
RDW: 14.9 % (ref 11.5–15.5)
WBC: 7.4 10*3/uL (ref 4.0–10.5)

## 2014-07-16 LAB — I-STAT CG4 LACTIC ACID, ED: LACTIC ACID, VENOUS: 2.04 mmol/L — AB (ref 0.5–2.0)

## 2014-07-16 LAB — TROPONIN I
Troponin I: <0.03 ng/mL (ref <0.031)
Troponin I: <0.03 ng/mL (ref <0.031)

## 2014-07-16 LAB — OCCULT BLOOD X 1 CARD TO LAB, STOOL: Fecal Occult Bld: NEGATIVE

## 2014-07-16 LAB — MRSA PCR SCREENING: MRSA by PCR: NEGATIVE

## 2014-07-16 LAB — PROTIME-INR
INR: 1.36 (ref 0.00–1.49)
Prothrombin Time: 16.9 seconds — ABNORMAL HIGH (ref 11.6–15.2)

## 2014-07-16 MED ORDER — PIPERACILLIN-TAZOBACTAM 3.375 G IVPB
3.3750 g | Freq: Three times a day (TID) | INTRAVENOUS | Status: DC
  Administered 2014-07-16 – 2014-07-17 (×4): 3.375 g via INTRAVENOUS
  Filled 2014-07-16 (×6): qty 50

## 2014-07-16 MED ORDER — ONDANSETRON HCL 4 MG/2ML IJ SOLN: 4.0000 mg | Freq: Four times a day (QID) | INTRAMUSCULAR | Status: DC | PRN

## 2014-07-16 MED ORDER — ONDANSETRON HCL 4 MG PO TABS: 4.0000 mg | ORAL_TABLET | Freq: Four times a day (QID) | ORAL | Status: DC | PRN

## 2014-07-16 MED ORDER — ATORVASTATIN CALCIUM 10 MG PO TABS
10.0000 mg | ORAL_TABLET | Freq: Every day | ORAL | Status: DC
  Administered 2014-07-16 – 2014-07-18 (×3): 10 mg via ORAL
  Filled 2014-07-16 (×4): qty 1

## 2014-07-16 MED ORDER — OXYCODONE HCL 5 MG PO TABS
5.0000 mg | ORAL_TABLET | ORAL | Status: DC | PRN
  Administered 2014-07-19: 5 mg via ORAL
  Filled 2014-07-16: qty 1

## 2014-07-16 MED ORDER — LATANOPROST 0.005 % OP SOLN
1.0000 [drp] | Freq: Every day | OPHTHALMIC | Status: DC
Start: 2014-07-16 — End: 2014-07-19
  Administered 2014-07-16 – 2014-07-18 (×3): 1 [drp] via OPHTHALMIC
  Filled 2014-07-16: qty 2.5

## 2014-07-16 MED ORDER — PIPERACILLIN-TAZOBACTAM 3.375 G IVPB: 3.3750 g | Freq: Three times a day (TID) | INTRAVENOUS | Status: DC

## 2014-07-16 MED ORDER — ACETAMINOPHEN 650 MG RE SUPP: 650.0000 mg | Freq: Four times a day (QID) | RECTAL | Status: DC | PRN

## 2014-07-16 MED ORDER — ASPIRIN EC 81 MG PO TBEC
81.0000 mg | DELAYED_RELEASE_TABLET | Freq: Every day | ORAL | Status: DC
Start: 2014-07-16 — End: 2014-07-19
  Administered 2014-07-16 – 2014-07-19 (×4): 81 mg via ORAL
  Filled 2014-07-16 (×4): qty 1

## 2014-07-16 MED ORDER — SODIUM CHLORIDE 0.9 % IV SOLN: INTRAVENOUS | Status: DC

## 2014-07-16 MED ORDER — ALUM & MAG HYDROXIDE-SIMETH 200-200-20 MG/5ML PO SUSP: 30.0000 mL | Freq: Four times a day (QID) | ORAL | Status: DC | PRN

## 2014-07-16 MED ORDER — HYDROMORPHONE HCL 1 MG/ML IJ SOLN: 0.5000 mg | INTRAMUSCULAR | Status: DC | PRN

## 2014-07-16 MED ORDER — VANCOMYCIN HCL IN DEXTROSE 750-5 MG/150ML-% IV SOLN
750.0000 mg | INTRAVENOUS | Status: DC
  Administered 2014-07-16: 750 mg via INTRAVENOUS
  Filled 2014-07-16 (×2): qty 150

## 2014-07-16 MED ORDER — DONEPEZIL HCL 5 MG PO TABS
5.0000 mg | ORAL_TABLET | Freq: Every day | ORAL | Status: DC
  Administered 2014-07-16 – 2014-07-18 (×3): 5 mg via ORAL
  Filled 2014-07-16 (×4): qty 1

## 2014-07-16 MED ORDER — TAMSULOSIN HCL 0.4 MG PO CAPS
0.4000 mg | ORAL_CAPSULE | Freq: Every day | ORAL | Status: DC
  Administered 2014-07-16 – 2014-07-19 (×4): 0.4 mg via ORAL
  Filled 2014-07-16 (×4): qty 1

## 2014-07-16 MED ORDER — INSULIN ASPART 100 UNIT/ML ~~LOC~~ SOLN
0.0000 [IU] | Freq: Three times a day (TID) | SUBCUTANEOUS | Status: DC
  Administered 2014-07-17: 2 [IU] via SUBCUTANEOUS
  Administered 2014-07-17: 3 [IU] via SUBCUTANEOUS
  Administered 2014-07-18 – 2014-07-19 (×2): 1 [IU] via SUBCUTANEOUS

## 2014-07-16 MED ORDER — RIVAROXABAN 20 MG PO TABS
20.0000 mg | ORAL_TABLET | Freq: Every day | ORAL | Status: DC
  Administered 2014-07-16 – 2014-07-18 (×3): 20 mg via ORAL
  Filled 2014-07-16 (×4): qty 1

## 2014-07-16 MED ORDER — INSULIN ASPART 100 UNIT/ML ~~LOC~~ SOLN
3.0000 [IU] | Freq: Three times a day (TID) | SUBCUTANEOUS | Status: DC
  Administered 2014-07-17: 3 [IU] via SUBCUTANEOUS

## 2014-07-16 MED ORDER — ACETAMINOPHEN 325 MG PO TABS
650.0000 mg | ORAL_TABLET | Freq: Four times a day (QID) | ORAL | Status: DC | PRN
  Administered 2014-07-18 – 2014-07-19 (×2): 650 mg via ORAL
  Filled 2014-07-16 (×2): qty 2

## 2014-07-16 NOTE — ED Notes (Signed)
MD Arnoldo Morale at bedside.

## 2014-07-16 NOTE — ED Notes (Signed)
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

## 2014-07-16 NOTE — H&P (Signed)
[facility] Admission History and Physical       BYARD CARRANZA JHE:[id number] DOB: 1940/11/08 DOA: 07/15/2014  Referring physician: EDP PCP: [name], MD  Specialists:   Chief Complaint: Passed out  HPI: Grant Lara is a 74 y.o. 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   His family called EMS, and EMS found him to be hypotensive with a systolic Blood pressure in the 70's.   He was administered a bolus of IVFs and had improvement.  In the ED , he was found to also have hypothermia and was placed on the warming blanket.   A Sepsis workup was initiated and he was placed on IV Vancomycin and Zosyn and referred for medical admission.    He denies any Fevrs or chills, or chest pain or SOB or Coughing.     Review of Systems:  Constitutional: No Weight Loss, No Weight Gain, Night Sweats, Fevers, Chills, +Hypothermia, Dizziness, Light Headedness, Fatigue, +Generalized Weakness HEENT: No Headaches, Difficulty Swallowing,Tooth/Dental Problems,Sore Throat,  No Sneezing, Rhinitis, Ear Ache, Nasal Congestion, or Post Nasal Drip,  Cardio-vascular:  No Chest pain, Orthopnea, PND, Edema in Lower Extremities, Anasarca, Dizziness, Palpitations  Resp: No Dyspnea, No DOE, No Productive Cough, No Non-Productive Cough, No Hemoptysis, No Wheezing.    GI: No Heartburn, Indigestion, Abdominal Pain, Nausea, Vomiting, Diarrhea, Constipation, Hematemesis, Hematochezia, Melena, Change in Bowel Habits,  Loss of Appetite  GU: No Dysuria, No Change in Color of Urine, No Urgency or Urinary Frequency, No Flank pain.  Musculoskeletal: No Joint Pain or Swelling, No Decreased Range of Motion, No Back Pain.  Neurologic: No Syncope, No Seizures, Muscle Weakness, Paresthesia, Vision Disturbance or Loss, No Diplopia, No  Vertigo, No Difficulty Walking,  Skin: No Rash or Lesions. Psych: No Change in Mood or Affect, No Depression or Anxiety, No Memory loss, No Confusion, or Hallucinations   Past Medical History  Diagnosis Date  . Hypertension   . Dilated cardiomyopathy     2/12: EF 30-35%, trivial AI, mild RAE.  EF 2014 40 -45%  . CAD (coronary artery disease)     LHC 9/05 with Dr. Einar Gip:  dLM 20-30%, LAD 85%, oD1 20-30%.  PCI:  Taxus DES to LAD; Dx jailed and tx with POBA.  Last myoview 12/10: inf scar, no ischemia, EF 29%.  . DVT (deep venous thrombosis)   . Pulmonary embolus     chronic coumadin  . Hyperhomocystinemia   . PFO (patent foramen ovale)     Not mentioned on 2014 echo.  [name] HLD (hyperlipidemia)   . Crohn's disease   . Nephrolithiasis   . Diabetes mellitus     11/05/11 "borderline; don't take medications"  . Stroke 1993    "left arm can't hold steady; leg too"  . Arthritis     "used to have a touch in my legs"  . Memory difficulties   . Pneumonia ~ 2011    09-22-13 denies any recent SOB or breathing problems  . Swelling of both ankles      09-22-13 occ.feet, but denies pain.  . Bradycardia 06/01/2014  . Dementia      Past Surgical History  Procedure Laterality Date  . Colon surgery  1994; 1996    "for Crohn's disease"  . Appendectomy    .  Implantable cardioverter defibrillator implant N/A 06/02/2014    Procedure: IMPLANTABLE CARDIOVERTER DEFIBRILLATOR IMPLANT;  Surgeon: [name], MD;  Location: [facility] CATH LAB;  Service: Cardiovascular;  Laterality: N/A;      Prior to Admission medications   Medication Sig Start Date End Date Taking? Authorizing Provider  acetaminophen (TYLENOL) 325 MG tablet Take 2 tablets (650 mg total) by mouth every 6 (six) hours as needed for mild pain, moderate pain or fever. 11/23/13   Modena Jansky, MD  aspirin EC 81 MG EC tablet Take 1 tablet (81 mg total) by mouth daily. 04/18/14   Maryann Mikhail, DO  atorvastatin (LIPITOR) 10 MG tablet Take 1  tablet (10 mg total) by mouth daily at 6 PM. 04/18/14   Maryann Mikhail, DO  donepezil (ARICEPT) 5 MG tablet Take 5 mg by mouth at bedtime.    Historical Provider, MD  glucose monitoring kit (FREESTYLE) monitoring kit 1 each by Does not apply route as needed for other. Please give 100 lancets and testing strips.  Check sugars every morning before breakfast and every evening before dinner 08/23/13   Debbe Odea, MD  insulin aspart (NOVOLOG) 100 UNIT/ML injection CBG 70 - 120: 0 units CBG 121 - 150: 1 unit CBG 151 - 200: 2 units CBG 201 - 250: 3 units CBG 251 - 300: 5 units CBG 301 - 350: 7 units CBG 351 - 400: 9 units 06/04/14   Hosie Poisson, MD  latanoprost (XALATAN) 0.005 % ophthalmic solution Place 1 drop into both eyes at bedtime. 06/29/13   Historical Provider, MD  rivaroxaban (XARELTO) 20 MG TABS tablet Take 1 tablet (20 mg total) by mouth daily with supper. 06/23/14   Hosie Poisson, MD  tamsulosin (FLOMAX) 0.4 MG CAPS capsule Take 1 capsule (0.4 mg total) by mouth daily. 04/18/14   Maryann Mikhail, DO     No Known Allergies  Social History:  reports that he has been smoking Cigarettes.  He has a 26.5 pack-year smoking history. He has never used smokeless tobacco. He reports that he does not drink alcohol or use illicit drugs.    Family History  Problem Relation Age of Onset  . Diabetes type II Mother   . CAD Father   . Diabetes type II Brother        Physical Exam:  GEN:  Pleasant  74 y.o. male examined and in no acute distress; cooperative with exam Filed Vitals:   07/16/14 0105 07/16/14 0130 07/16/14 0200 07/16/14 0230  BP:  102/51 100/50 99/50  Pulse:  76 78 74  Temp: 98.8 F (37.1 C)     TempSrc: Rectal     Resp:  _0 SpO2:  99% 100% 99%   Blood pressure 99/50, pulse 74, temperature 98.8 F (37.1 C), temperature source Rectal, resp. rate 12, SpO2 99 %. PSYCH: He is alert and oriented x4; does not appear anxious does not appear depressed; affect is normal HEENT:  Normocephalic and Atraumatic, Mucous membranes pink; PERRLA; EOM intact; Fundi:  Benign;  No scleral icterus, Nares: Patent, Oropharynx: Clear, Fair Dentition,    Neck:  FROM, No Cervical Lymphadenopathy nor Thyromegaly or Carotid Bruit; No JVD; Breasts:: Not examined CHEST WALL: No tenderness CHEST: Normal respiration, clear to auscultation bilaterally HEART: Regular rate and rhythm; no murmurs rubs or gallops BACK: No kyphosis or scoliosis; No CVA tenderness ABDOMEN: Positive Bowel Sounds, Scaphoid, Soft Non-Tender, No Rebound or Guarding; No Masses, No Organomegaly EXTREMITIES: No Cyanosis, Clubbing, or Edema; No Ulcerations.  Genitalia: not examined PULSES: 2+ and symmetric SKIN: Normal hydration no rash or ulceration  CNS:  Alert and Oriented x 4, +Expressive Aphasia, +Left hemiparesis Vascular: pulses palpable throughout    Labs on Admission:  Basic Metabolic Panel:  Recent Labs Lab 07/15/14 2325  NA 138  K 4.4  CL 109  CO2 26  GLUCOSE 279*  BUN 20  CREATININE 1.50*  CALCIUM 8.5   Liver Function Tests:  Recent Labs Lab 07/15/14 2325  AST 28  ALT 51  ALKPHOS 92  BILITOT 0.4  PROT 5.3*  ALBUMIN 2.6*   No results for input(s): LIPASE, AMYLASE in the last 168 hours. No results for input(s): AMMONIA in the last 168 hours. CBC:  Recent Labs Lab 07/15/14 2325  WBC 7.9  NEUTROABS 6.5  HGB 8.4*  HCT 26.1*  MCV 94.6  PLT 195   Cardiac Enzymes:  Recent Labs Lab 07/15/14 2325  TROPONINI <0.03    BNP (last 3 results) No results for input(s): BNP in the last 8760 hours.  ProBNP (last 3 results) No results for input(s): PROBNP in the last 8760 hours.  CBG: No results for input(s): GLUCAP in the last 168 hours.  Radiological Exams on Admission: Dg Chest 2 View  07/16/2014   CLINICAL DATA:  Near syncopal episode when coming out of bathroom. No fall. Recent stroke.  EXAM: CHEST  2 VIEW  COMPARISON:  Chest radiograph June 04, 2014  FINDINGS: Cardiac  silhouette is mildly enlarged, mediastinal silhouette is nonsuspicious. Dual lead LEFT cardiac defibrillator. No pleural effusions or focal consolidations. No pneumothorax. Soft tissue planes and included osseous structures are nonsuspicious.  IMPRESSION: Mild cardiomegaly, no acute pulmonary process.   Electronically Signed   By: Elon Alas   On: 07/16/2014 00:03     EKG: Independently reviewed.    Assessment/Plan:   74 y.o. male with  Principal Problem:   1.   SIRS (systemic inflammatory response syndrome)/ Sepsis   Cultures sent   IV Vancomycin and Zosyn    IVFs    2.   Syncope- Due to Hypotension   Cardiac Monitoring   IVFs for Fluid resuscitation        Active Problems:   3.   Hypotension   IVFs for Fluid Resuscitation   Monitor BPs     4.   Hypothermia- Due to #1   Warming Blanket PRN   Monitor Temperatures         5.   Coronary atherosclerosis/ Coronary artery disease involving native coronary artery of native heart without angina pectoris   Cycle Troponins   Cardiac Monitoring     6.   Chronic systolic heart failure   Monitor for Signs of Fluid Overload      7.   Crohn's disease   Stable     8.   DM2 (diabetes mellitus, type 2)   SSI Coverage PRN   Check HbA1C in AM    9.   Pulmonary embolus- On Long-Term Anticoagulant Rx   Continue Xarelto Rx   10.   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   DVT Prophylaxis    On Xarelto Rx          Code Status:     FULL CODE        Family Communication:  No Family Present    Disposition Plan:    Inpatient  Status  Time spent:  60 70 [place] [facility] Pager [phone]   If [place] Please Contact the Day Rounding Team MD for [facility]  If 7PM-7AM, Please Contact Night-Floor Coverage  [email or web address] Password [email or web address] 07/16/2014, 3:21 AM     ADDENDUM:    Patient was seen and examined on 07/16/2014

## 2014-07-16 NOTE — ED Notes (Signed)
Dr Leonides Schanz given a copy of lactic acid results 2.04

## 2014-07-16 NOTE — Consult Note (Signed)
CONSULTATION NOTE  Reason for Consult: Syncope  Requesting Physician: Dr. Verlon Au  Cardiologist: Dr. Percival Spanish  HPI: This is a 74 y.o. male with a past medical history significant for dilated cardiomyopathy and EF of 30-35% in 2012, improved to 40-45% in 2014. He also has known CAD with prior PCI to the LAD in 2015 - he also has known inferior scar. He had recent pulmonary embolism and was started on xarelto, after failing warfarin due to difficulty maintaining a therapeutic INR. Echo was repeated in 05/2014, which shows further decline of LVEF to 20-25%. He recently had an Medtronic AICD placed for primary prevention of SCD by Dr. Caryl Comes. He is now admitted for syncope by the hospital medicine service. He was eating dinner and stood up and passed out. Was briefly dizzy. No chest pain. EMS was called and he was found to be hypotensive by with SBP in the 70's. He was hypothermic and there was concern for sepsis - he was placed on vancomycin and zosyn. Cardiology is asked to consult regarding syncope.  Device interrogated this morning and does not show any cardiac episodes with appropriate pacing and sensing. Troponin is negative x 2.  PMHx:  Past Medical History  Diagnosis Date  . Hypertension   . Dilated cardiomyopathy     2/12: EF 30-35%, trivial AI, mild RAE.  EF 2014 40 -45%  . CAD (coronary artery disease)     LHC 9/05 with Dr. Einar Gip:  dLM 20-30%, LAD 85%, oD1 20-30%.  PCI:  Taxus DES to LAD; Dx jailed and tx with POBA.  Last myoview 12/10: inf scar, no ischemia, EF 29%.  . DVT (deep venous thrombosis)   . Pulmonary embolus     chronic coumadin  . Hyperhomocystinemia   . PFO (patent foramen ovale)     Not mentioned on 2014 echo.  [name] HLD (hyperlipidemia)   . Crohn's disease   . Nephrolithiasis   . Diabetes mellitus     11/05/11 "borderline; don't take medications"  . Stroke 1993    "left arm can't hold steady; leg too"  . Arthritis     "used to have a touch in my legs"  .  Memory difficulties   . Pneumonia ~ 2011    09-22-13 denies any recent SOB or breathing problems  . Swelling of both ankles      09-22-13 occ.feet, but denies pain.  . Bradycardia 06/01/2014  . Dementia    Past Surgical History  Procedure Laterality Date  . Colon surgery  1994; 1996    "for Crohn's disease"  . Appendectomy    . Implantable cardioverter defibrillator implant N/A 06/02/2014    Procedure: IMPLANTABLE CARDIOVERTER DEFIBRILLATOR IMPLANT;  Surgeon: [name], MD;  Location: [facility] CATH LAB;  Service: Cardiovascular;  Laterality: N/A;    FAMHx: Family History  Problem Relation Age of Onset  . Diabetes type II Mother   . CAD Father   . Diabetes type II Brother     SOCHx:  reports that he has been smoking Cigarettes.  He has a 26.5 pack-year smoking history. He has never used smokeless tobacco. He reports that he does not drink alcohol or use illicit drugs.  ALLERGIES: No Known Allergies  ROS: A comprehensive review of systems was negative except for: Constitutional: positive for fatigue and weakness  HOME MEDICATIONS: Prescriptions prior to admission  Medication Sig Dispense Refill Last Dose  . acetaminophen (TYLENOL) 325 MG tablet Take 2 tablets (650 mg total) by mouth every  6 (six) hours as needed for mild pain, moderate pain or fever.   Taking  . aspirin EC 81 MG EC tablet Take 1 tablet (81 mg total) by mouth daily. 30 tablet 0 Taking  . atorvastatin (LIPITOR) 10 MG tablet Take 1 tablet (10 mg total) by mouth daily at 6 PM. 30 tablet 0 Taking  . donepezil (ARICEPT) 5 MG tablet Take 5 mg by mouth at bedtime.   Taking  . glucose monitoring kit (FREESTYLE) monitoring kit 1 each by Does not apply route as needed for other. Please give 100 lancets and testing strips.  Check sugars every morning before breakfast and every evening before dinner 1 each 0 Taking  . insulin aspart (NOVOLOG) 100 UNIT/ML injection CBG 70 - 120: 0 units CBG 121 - 150: 1 unit CBG 151 - 200: 2  units CBG 201 - 250: 3 units CBG 251 - 300: 5 units CBG 301 - 350: 7 units CBG 351 - 400: 9 units 10 mL 1 Taking  . latanoprost (XALATAN) 0.005 % ophthalmic solution Place 1 drop into both eyes at bedtime.   Taking  . rivaroxaban (XARELTO) 20 MG TABS tablet Take 1 tablet (20 mg total) by mouth daily with supper. 30 tablet  Taking  . tamsulosin (FLOMAX) 0.4 MG CAPS capsule Take 1 capsule (0.4 mg total) by mouth daily. 30 capsule 0 Taking    HOSPITAL MEDICATIONS: Prior to Admission:  Prescriptions prior to admission  Medication Sig Dispense Refill Last Dose  . acetaminophen (TYLENOL) 325 MG tablet Take 2 tablets (650 mg total) by mouth every 6 (six) hours as needed for mild pain, moderate pain or fever.   Taking  . aspirin EC 81 MG EC tablet Take 1 tablet (81 mg total) by mouth daily. 30 tablet 0 Taking  . atorvastatin (LIPITOR) 10 MG tablet Take 1 tablet (10 mg total) by mouth daily at 6 PM. 30 tablet 0 Taking  . donepezil (ARICEPT) 5 MG tablet Take 5 mg by mouth at bedtime.   Taking  . glucose monitoring kit (FREESTYLE) monitoring kit 1 each by Does not apply route as needed for other. Please give 100 lancets and testing strips.  Check sugars every morning before breakfast and every evening before dinner 1 each 0 Taking  . insulin aspart (NOVOLOG) 100 UNIT/ML injection CBG 70 - 120: 0 units CBG 121 - 150: 1 unit CBG 151 - 200: 2 units CBG 201 - 250: 3 units CBG 251 - 300: 5 units CBG 301 - 350: 7 units CBG 351 - 400: 9 units 10 mL 1 Taking  . latanoprost (XALATAN) 0.005 % ophthalmic solution Place 1 drop into both eyes at bedtime.   Taking  . rivaroxaban (XARELTO) 20 MG TABS tablet Take 1 tablet (20 mg total) by mouth daily with supper. 30 tablet  Taking  . tamsulosin (FLOMAX) 0.4 MG CAPS capsule Take 1 capsule (0.4 mg total) by mouth daily. 30 capsule 0 Taking    VITALS: Blood pressure 153/47, pulse 65, temperature 97.4 F (36.3 C), temperature source Oral, resp. rate 17, height 5'  9" (1.753 m), weight 117 lb 8.1 oz (53.3 kg), SpO2 100 %.  PHYSICAL EXAM: General appearance: alert and no distress Neck: no carotid bruit and no JVD Lungs: clear to auscultation bilaterally Heart: regular rate and rhythm, S1, S2 normal, no murmur, click, rub or gallop Abdomen: soft, non-tender; bowel sounds normal; no masses,  no organomegaly and scaphoid Extremities: extremities normal, atraumatic, no cyanosis or edema and  ICD site without swelling, erythema or warmth Pulses: 2+ and symmetric Skin: Skin color, texture, turgor normal. No rashes or lesions Neurologic: Alert and oriented X 3, normal strength and tone. Normal symmetric reflexes. Normal coordination and gait Psych: Pleasant  LABS: Results for orders placed or performed during the hospital encounter of 07/15/14 (from the past 48 hour(s))  Troponin I     Status: None   Collection Time: 07/15/14 11:25 PM  Result Value Ref Range   Troponin I <0.03 <0.031 ng/mL    Comment:        NO INDICATION OF MYOCARDIAL INJURY.   CBC with Differential     Status: Abnormal   Collection Time: 07/15/14 11:25 PM  Result Value Ref Range   WBC 7.9 4.0 - 10.5 K/uL   RBC 2.76 (L) 4.22 - 5.81 MIL/uL   Hemoglobin 8.4 (L) 13.0 - 17.0 g/dL   HCT 26.1 (L) 39.0 - 52.0 %   MCV 94.6 78.0 - 100.0 fL   MCH 30.4 26.0 - 34.0 pg   MCHC 32.2 30.0 - 36.0 g/dL   RDW 14.8 11.5 - 15.5 %   Platelets 195 150 - 400 K/uL   Neutrophils Relative % 83 (H) 43 - 77 %   Neutro Abs 6.5 1.7 - 7.7 K/uL   Lymphocytes Relative 11 (L) 12 - 46 %   Lymphs Abs 0.9 0.7 - 4.0 K/uL   Monocytes Relative 5 3 - 12 %   Monocytes Absolute 0.4 0.1 - 1.0 K/uL   Eosinophils Relative 1 0 - 5 %   Eosinophils Absolute 0.1 0.0 - 0.7 K/uL   Basophils Relative 0 0 - 1 %   Basophils Absolute 0.0 0.0 - 0.1 K/uL  Comprehensive metabolic panel     Status: Abnormal   Collection Time: 07/15/14 11:25 PM  Result Value Ref Range   Sodium 138 135 - 145 mmol/L   Potassium 4.4 3.5 - 5.1 mmol/L    Chloride 109 96 - 112 mmol/L   CO2 26 19 - 32 mmol/L   Glucose, Bld 279 (H) 70 - 99 mg/dL   BUN 20 6 - 23 mg/dL   Creatinine, Ser 1.50 (H) 0.50 - 1.35 mg/dL   Calcium 8.5 8.4 - 10.5 mg/dL   Total Protein 5.3 (L) 6.0 - 8.3 g/dL   Albumin 2.6 (L) 3.5 - 5.2 g/dL   AST 28 0 - 37 U/L   ALT 51 0 - 53 U/L   Alkaline Phosphatase 92 39 - 117 U/L   Total Bilirubin 0.4 0.3 - 1.2 mg/dL   GFR calc non Af Amer 44 (L) >90 mL/min   GFR calc Af Amer 51 (L) >90 mL/min    Comment: (NOTE) The eGFR has been calculated using the CKD EPI equation. This calculation has not been validated in all clinical situations. eGFR's persistently <90 mL/min signify possible Chronic Kidney Disease.    Anion gap 3 (L) 5 - 15  I-Stat CG4 Lactic Acid, ED     Status: Abnormal   Collection Time: 07/15/14 11:45 PM  Result Value Ref Range   Lactic Acid, Venous 3.10 (HH) 0.5 - 2.0 mmol/L   Comment NOTIFIED PHYSICIAN   Urinalysis, Routine w reflex microscopic     Status: Abnormal   Collection Time: 07/16/14 12:46 AM  Result Value Ref Range   Color, Urine AMBER (A) YELLOW    Comment: BIOCHEMICALS MAY BE AFFECTED BY COLOR   APPearance CLOUDY (A) CLEAR   Specific Gravity, Urine 1.031 (H) 1.005 -  1.030   pH 5.0 5.0 - 8.0   Glucose, UA 250 (A) NEGATIVE mg/dL   Hgb urine dipstick MODERATE (A) NEGATIVE   Bilirubin Urine SMALL (A) NEGATIVE   Ketones, ur 15 (A) NEGATIVE mg/dL   Protein, ur 30 (A) NEGATIVE mg/dL   Urobilinogen, UA 0.2 0.0 - 1.0 mg/dL   Nitrite NEGATIVE NEGATIVE   Leukocytes, UA NEGATIVE NEGATIVE  Urine microscopic-add on     Status: Abnormal   Collection Time: 07/16/14 12:46 AM  Result Value Ref Range   Squamous Epithelial / LPF RARE RARE   WBC, UA 3-6 <3 WBC/hpf   RBC / HPF 21-50 <3 RBC/hpf   Casts HYALINE CASTS (A) NEGATIVE   Urine-Other MUCOUS PRESENT   I-Stat CG4 Lactic Acid, ED     Status: Abnormal   Collection Time: 07/16/14  2:46 AM  Result Value Ref Range   Lactic Acid, Venous 2.04 (HH) 0.5  - 2.0 mmol/L   Comment NOTIFIED PHYSICIAN   MRSA PCR Screening     Status: None   Collection Time: 07/16/14  3:40 AM  Result Value Ref Range   MRSA by PCR NEGATIVE NEGATIVE    Comment:        The GeneXpert MRSA Assay (FDA approved for NASAL specimens only), is one component of a comprehensive MRSA colonization surveillance program. It is not intended to diagnose MRSA infection nor to guide or monitor treatment for MRSA infections.   Troponin I (q 6hr x 3)     Status: None   Collection Time: 07/16/14  4:11 AM  Result Value Ref Range   Troponin I <0.03 <0.031 ng/mL    Comment:        NO INDICATION OF MYOCARDIAL INJURY.   Basic metabolic panel     Status: Abnormal   Collection Time: 07/16/14  4:11 AM  Result Value Ref Range   Sodium 140 135 - 145 mmol/L   Potassium 4.0 3.5 - 5.1 mmol/L   Chloride 113 (H) 96 - 112 mmol/L   CO2 28 19 - 32 mmol/L   Glucose, Bld 171 (H) 70 - 99 mg/dL   BUN 19 6 - 23 mg/dL   Creatinine, Ser 1.36 (H) 0.50 - 1.35 mg/dL   Calcium 8.5 8.4 - 10.5 mg/dL   GFR calc non Af Amer 49 (L) >90 mL/min   GFR calc Af Amer 57 (L) >90 mL/min    Comment: (NOTE) The eGFR has been calculated using the CKD EPI equation. This calculation has not been validated in all clinical situations. eGFR's persistently <90 mL/min signify possible Chronic Kidney Disease.    Anion gap 0.0 (L) 5 - 15    Comment: REPEATED TO VERIFY  CBC     Status: Abnormal   Collection Time: 07/16/14  4:11 AM  Result Value Ref Range   WBC 7.4 4.0 - 10.5 K/uL   RBC 2.64 (L) 4.22 - 5.81 MIL/uL   Hemoglobin 8.0 (L) 13.0 - 17.0 g/dL   HCT 25.2 (L) 39.0 - 52.0 %   MCV 95.5 78.0 - 100.0 fL   MCH 30.3 26.0 - 34.0 pg   MCHC 31.7 30.0 - 36.0 g/dL   RDW 14.9 11.5 - 15.5 %   Platelets 179 150 - 400 K/uL  Occult blood card to lab, stool Provider will collect     Status: None   Collection Time: 07/16/14  8:08 AM  Result Value Ref Range   Fecal Occult Bld NEGATIVE NEGATIVE  Glucose, capillary  Status: Abnormal   Collection Time: 07/16/14  8:33 AM  Result Value Ref Range   Glucose-Capillary 64 (L) 70 - 99 mg/dL  Glucose, capillary     Status: Abnormal   Collection Time: 07/16/14  9:19 AM  Result Value Ref Range   Glucose-Capillary 121 (H) 70 - 99 mg/dL  Troponin I (q 6hr x 3)     Status: None   Collection Time: 07/16/14  9:35 AM  Result Value Ref Range   Troponin I <0.03 <0.031 ng/mL    Comment:        NO INDICATION OF MYOCARDIAL INJURY.   Protime-INR     Status: Abnormal   Collection Time: 07/16/14  9:35 AM  Result Value Ref Range   Prothrombin Time 16.9 (H) 11.6 - 15.2 seconds   INR 1.36 0.00 - 1.49    IMAGING: Dg Chest 2 View  07/16/2014   CLINICAL DATA:  Near syncopal episode when coming out of bathroom. No fall. Recent stroke.  EXAM: CHEST  2 VIEW  COMPARISON:  Chest radiograph June 04, 2014  FINDINGS: Cardiac silhouette is mildly enlarged, mediastinal silhouette is nonsuspicious. Dual lead LEFT cardiac defibrillator. No pleural effusions or focal consolidations. No pneumothorax. Soft tissue planes and included osseous structures are nonsuspicious.  IMPRESSION: Mild cardiomegaly, no acute pulmonary process.   Electronically Signed   By: Elon Alas   On: 07/16/2014 00:03    HOSPITAL DIAGNOSES: Principal Problem:   Syncope Active Problems:   Coronary atherosclerosis   Chronic systolic heart failure   Crohn's disease   DM2 (diabetes mellitus, type 2)   Coronary artery disease involving native coronary artery of native heart without angina pectoris   Hypotension   Hypothermia   SIRS (systemic inflammatory response syndrome)   Pulmonary embolus   Late effects of CVA (cerebrovascular accident)   IMPRESSION: 1. Orthostatic syncope, secondary to hypotension 2. Possible sepsis - urine suggests possible UTI 3. Chronic systolic heart failure - appears clinically compensated  RECOMMENDATION: 1. The etiology of his syncope appears to be hypotension -  there may be dehydration or underlying infection. Creatinine was elevated at 1.5 and is improving. Device interrogation shows no arrythmia, device firing or abnormality with sensing or pacing function. Troponin is negative x 3. Seems to be responding to hydration. BP now improved, if not hypertensive. Prior episode in February of syncope, also felt to be neurocardiogenic. He was noted to be hypotensive at the nursing home and his heart failure/bp meds were discontinued. He may have some degree of autonomic dysfunction leading to labile blood pressures. Ultimately, could consider midodrine, LE compression stockings, or the new medication Droxidopa to try to prevent neurocardiogenic syncope.  Thanks for the consult. Would recommend cardiology follow-up with Dr. Percival Spanish after discharge.  Time Spent Directly with Patient: 30 minutes  Pixie Casino, MD, [facility] Attending Cardiologist [facility]  Wladyslaw Henrichs C 07/16/2014, 12:37 PM

## 2014-07-16 NOTE — ED Notes (Signed)
Bair hugger placed on patient

## 2014-07-16 NOTE — ED Notes (Signed)
Phlebotomy at bedside.

## 2014-07-16 NOTE — Progress Notes (Signed)
Grant Lara ZOX:[id number] DOB: 27-May-1940 DOA: 07/15/2014 PCP: [name], MD  Brief narrative:  74 y/o ? Hyperhomocysteinemia, multiple Embolic [first in 8119?] CVA 04/15/14 + prior DVT + bilate PE  4.11.2007 and CVA at that time/ new acute PE 8/29015 on Xarelto 11/2013, CAD s/p stent LAD,+ resultant Systolic HF EF 14-78% Jan 2016.  Had PPM placed 05/2014 for 2/2 prevention VT + Neurocardiogenic syncope DM ty 2. CKD III, Chr Sys HF + EF 25-30% Note that n a prior Echo-PFO was noted + intrasepetal anuersym  Antecedent Memory issues prior to CVA's as per chart on Aricept COPD SBO 07/2013 Documented Upper lung lesion 10/2011  Past medical history-As per Problem list Chart reviewed as below- reviewed  Consultants:  none  Procedures:  none  Antibiotics:  none   Subjective   Alert Not completely oriented. Tolerating po No blood in stool No cp No fever no chill No LE swelling Cannot get clear h/o Orienting only failry    Objective    Interim History:   Telemetry: AV paced rhythm   Objective: Filed Vitals:   07/16/14 0330 07/16/14 0345 07/16/14 0400 07/16/14 0500  BP: 101/46  98/51 111/55  Pulse: 60  59 60  Temp:  98.3 F (36.8 C)    TempSrc:  Oral    Resp: 15  9 9   Height: 5' 9"  (1.753 m)     Weight: 53.3 kg (117 lb 8.1 oz)     SpO2: 100% 100% 100% 100%    Intake/Output Summary (Last 24 hours) at 07/16/14 0740 Last data filed at 07/16/14 0500  Gross per 24 hour  Intake   92.5 ml  Output     45 ml  Net   47.5 ml    Exam:  General: Frail, bilateral temporalis, supraclavicular wasting, moderate dentition, Mallampati 2 Cardiovascular: S1-S2 no murmur rub or gallop cannot really appreciate any bruit, mild JVD Respiratory: Clinically clear, no added sound Abdomen: Soft nontender no distended no rebound, rectal = stool masses, no dark stool on glove Skin no lower extremity edema Neuro intact  Data Reviewed: Basic Metabolic Panel:  Recent  Labs Lab 07/15/14 2325 07/16/14 0411  NA 138 140  K 4.4 4.0  CL 109 113*  CO2 26 28  GLUCOSE 279* 171*  BUN 20 19  CREATININE 1.50* 1.36*  CALCIUM 8.5 8.5   Liver Function Tests:  Recent Labs Lab 07/15/14 2325  AST 28  ALT 51  ALKPHOS 92  BILITOT 0.4  PROT 5.3*  ALBUMIN 2.6*   No results for input(s): LIPASE, AMYLASE in the last 168 hours. No results for input(s): AMMONIA in the last 168 hours. CBC:  Recent Labs Lab 07/15/14 2325 07/16/14 0411  WBC 7.9 7.4  NEUTROABS 6.5  --   HGB 8.4* 8.0*  HCT 26.1* 25.2*  MCV 94.6 95.5  PLT 195 179   Cardiac Enzymes:  Recent Labs Lab 07/15/14 2325 07/16/14 0411  TROPONINI <0.03 <0.03   BNP: Invalid input(s): POCBNP CBG: No results for input(s): GLUCAP in the last 168 hours.  Recent Results (from the past 240 hour(s))  MRSA PCR Screening     Status: None   Collection Time: 07/16/14  3:40 AM  Result Value Ref Range Status   MRSA by PCR NEGATIVE NEGATIVE Final    Comment:        The GeneXpert MRSA Assay (FDA approved for NASAL specimens only), is one component of a comprehensive MRSA colonization surveillance program. It is not intended to  diagnose MRSA infection nor to guide or monitor treatment for MRSA infections.      Studies:              All Imaging reviewed and is as per above notation   Scheduled Meds: . aspirin EC  81 mg Oral Daily  . atorvastatin  10 mg Oral q1800  . donepezil  5 mg Oral QHS  . insulin aspart  0-9 Units Subcutaneous TID WC  . insulin aspart  3 Units Subcutaneous TID WC  . latanoprost  1 drop Both Eyes QHS  . piperacillin-tazobactam (ZOSYN)  IV  3.375 g Intravenous 3 times per day  . rivaroxaban  20 mg Oral Q supper  . tamsulosin  0.4 mg Oral Daily  . vancomycin  750 mg Intravenous Q24H   Continuous Infusions: . sodium chloride 75 mL/hr at 07/16/14 0346     Assessment/Plan:  1. Syncope?-Cause-neurocardiogenic versus other-patient also has dropped hemoglobin--I've  hemocculted stool and this is negative-suspect hemodilution. We will interrogate pacemaker. Going forward may need to discuss with cardiology other options--patient's last presentation to the hospital in 05/2014 was remarkably similar to this one wherein patient was hypothermic and had loss of consciousness and that was when the pacemaker was placed. I do not feel inclined to think that his syncopal episode is in any way infectious at this stage other than one vital sign i.e. hypothermia. We will rapidly taper antibiotics if repeat chest x-ray on 4/3 is not confirm any source of infection--suspect addition of alpha-2 antagonist as well as anticholinergic medications could also cause reasons for syncope.  We will get CT head now. 2. History of multiple embolic strokes, first one 1995, status post CVA 04/15/2014-also documented history hyper homocystinemia, PFO and intraseptal no aneurysm. This patient represents an exorbitant risk for recurrent CVA-will need to continue on Xarelto regardless of hemoglobin levels 3. Chronic systolic heart failure + EF 25-30% 04/2014 + PPM Medtronic placement for 2/2 prevention V. tach, acute coronary death-patient seems euvolemic. Cut back IV saline from 100 cc/h-->75 cc per hour 07/16/58 4. CAD S/P LAD stent-reasonable to continue aspirin + Zaroxolyn at this stage. It appears he is not on any antecedent beta blocker or ACE inhibitor-relative contraindication given tachybradycardia syndrome, neurocardiogenic syncope. 5. 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 This usually use for organic brain disease or Alzheimer's dementia.-Defer to outpatient physician regarding its utility 6. Benign prostatic hypertrophy-on rectal exam on admission no specific prostate enlargement. Continue Flomax 0.4 daily.  7. Risk for aspiration-multiple strokes-repeat chest x-ray a.m. we will consider speech input if needed moving forward  Code  Status: Full Family Communication: None at bedside Wife on phone tells me he was walking towards RR and he stumbled-Before he fell tot he floor was caught by wife.  Seems "out of it"  Got clammy and sweaty--wouldn;t follow command.  Couldn't understand what he was saying.  He was out of it for ~ 2-3 min--Wouldn't sit up by himself. Paramedics put IV iron-Smile per wife was altered Speech was slurred-this lasted ~ 10-15 min Disposition Plan: SDU   Verneita Griffes, MD  [facility] Pager [phone] 07/16/2014, 7:40 AM    LOS: 0 days

## 2014-07-17 ENCOUNTER — Inpatient Hospital Stay (HOSPITAL_COMMUNITY): Payer: Medicare Other

## 2014-07-17 DIAGNOSIS — K50912 Crohn's disease, unspecified, with intestinal obstruction: Secondary | ICD-10-CM

## 2014-07-17 DIAGNOSIS — I2510 Atherosclerotic heart disease of native coronary artery without angina pectoris: Secondary | ICD-10-CM

## 2014-07-17 DIAGNOSIS — I2699 Other pulmonary embolism without acute cor pulmonale: Secondary | ICD-10-CM

## 2014-07-17 DIAGNOSIS — I6990 Unspecified sequelae of unspecified cerebrovascular disease: Secondary | ICD-10-CM

## 2014-07-17 DIAGNOSIS — T68XXXD Hypothermia, subsequent encounter: Secondary | ICD-10-CM

## 2014-07-17 LAB — GLUCOSE, CAPILLARY
GLUCOSE-CAPILLARY: 161 mg/dL — AB (ref 70–99)
GLUCOSE-CAPILLARY: 81 mg/dL (ref 70–99)
Glucose-Capillary: 170 mg/dL — ABNORMAL HIGH (ref 70–99)
Glucose-Capillary: 83 mg/dL (ref 70–99)
Glucose-Capillary: 98 mg/dL (ref 70–99)

## 2014-07-17 LAB — URINE CULTURE
CULTURE: NO GROWTH
Colony Count: NO GROWTH

## 2014-07-17 NOTE — Progress Notes (Addendum)
Grant Lara DUK:[id number] DOB: 1940/08/18 DOA: 07/15/2014 PCP: [name], MD  Brief narrative:  74 y/o ? 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  Had PPM placed 05/2014 for 2/2 prevention VT + Neurocardiogenic syncope DM ty 2. CKD III, Chr Sys HF + EF 25-30% Note that n a prior Echo-PFO was noted + intrasepetal anuersym  Antecedent Memory issues prior to CVA's as per chart on Aricept COPD SBO 07/2013 Documented Upper lung lesion 10/2011 Wife on phone tells me he was walking towards RR and he stumbled-Before he fell tot he floor was caught by wife.  Seems "out of it"  Got clammy and sweaty--wouldn'[t follow command.  Couldn't understand what he was saying.  He was out of it for ~ 2-3 min--Wouldn't sit up by himself. Paramedics put IV iron-Smile per wife was altered Speech was slurred-this lasted ~ 10-15 min  Past medical history-As per Problem list Chart reviewed as below- reviewed  Consultants:  none  Procedures:  none  Antibiotics:  none   Subjective   Well Eating fairly ~ 50% No cp no SOB No fever   Objective    Interim History:   Telemetry: AV paced rhythm   Objective: Filed Vitals:   07/17/14 0500 07/17/14 0600 07/17/14 0700 07/17/14 0735  BP: 128/62 116/59 132/62   Pulse: 68 60 59 61  Temp:      TempSrc:    Oral  Resp: 16   16  Height:      Weight:      SpO2: 100% 99% 100% 99%    Intake/Output Summary (Last 24 hours) at 07/17/14 0815 Last data filed at 07/17/14 0600  Gross per 24 hour  Intake   2310 ml  Output    650 ml  Net   1660 ml    Exam:  General: Frail, bilateral temporalis, supraclavicular wasting, moderate dentition, Mallampati 2 Cardiovascular: S1-S2 no murmur rub or gallop cannot really appreciate any bruit, mild JVD Respiratory:  Clinically clear, no added sound Abdomen: Soft nontender no distended no rebound, rectal = stool masses, no dark stool on glove Skin no lower extremity edema Neuro intact  Data Reviewed: Basic Metabolic Panel:  Recent Labs Lab 07/15/14 2325 07/16/14 0411  NA 138 140  K 4.4 4.0  CL 109 113*  CO2 26 28  GLUCOSE 279* 171*  BUN 20 19  CREATININE 1.50* 1.36*  CALCIUM 8.5 8.5   Liver Function Tests:  Recent Labs Lab 07/15/14 2325  AST 28  ALT 51  ALKPHOS 92  BILITOT 0.4  PROT 5.3*  ALBUMIN 2.6*   No results for input(s): LIPASE, AMYLASE in the last 168 hours. No results for input(s): AMMONIA in the last 168 hours. CBC:  Recent Labs Lab 07/15/14 2325 07/16/14 0411  WBC 7.9 7.4  NEUTROABS 6.5  --   HGB 8.4* 8.0*  HCT 26.1* 25.2*  MCV 94.6 95.5  PLT 195 179   Cardiac Enzymes:  Recent Labs Lab 07/15/14 2325 07/16/14 0411 07/16/14 0935  TROPONINI <0.03 <0.03 <0.03   BNP: Invalid input(s): POCBNP CBG:  Recent Labs Lab 07/16/14 0833 07/16/14 0919 07/16/14 1339 07/16/14 1623 07/16/14 2208  GLUCAP 64* 121* 87 113* 138*    Recent Results (from the past 240 hour(s))  MRSA PCR Screening     Status: None  Collection Time: 07/16/14  3:40 AM  Result Value Ref Range Status   MRSA by PCR NEGATIVE NEGATIVE Final    Comment:        The GeneXpert MRSA Assay (FDA approved for NASAL specimens only), is one component of a comprehensive MRSA colonization surveillance program. It is not intended to diagnose MRSA infection nor to guide or monitor treatment for MRSA infections.      Studies:              All Imaging reviewed and is as per above notation   Scheduled Meds: . aspirin EC  81 mg Oral Daily  . atorvastatin  10 mg Oral q1800  . donepezil  5 mg Oral QHS  . insulin aspart  0-9 Units Subcutaneous TID WC  . insulin aspart  3 Units Subcutaneous TID WC  . latanoprost  1 drop Both Eyes QHS  . piperacillin-tazobactam (ZOSYN)  IV  3.375 g  Intravenous 3 times per day  . rivaroxaban  20 mg Oral Q supper  . tamsulosin  0.4 mg Oral Daily  . vancomycin  750 mg Intravenous Q24H   Continuous Infusions: . sodium chloride 75 mL/hr at 07/17/14 0600     Assessment/Plan:  1. Syncope?-Cause-neurocardiogenic versus other-patient also has dropped hemoglobin--I've hemocculted stool and this is negative-suspect hemodilution. PPM interrogation 4/2 wnl. Going forward may need to discuss with cardiology other options--patient's last presentation to the hospital in 05/2014 was remarkably similar to this one wherein patient was hypothermic+ LOC-unlikely infectious at this stage other than one vital sign i.e. hypothermia. We will rapidly taper antibiotics if repeat chest x-ray on 4/3 is not confirm any source of infection--suspect addition of alpha-2 antagonist as well as anticholinergic medications could also cause reasons for syncope.  CT head 4/2 no acute CVA.  Cardiology feels this is neuro-cardiogenic syncope.  TED hose thigh high, continue orthostatics.  Will consider florinef/midodrine Doesnt have Pyelo-UA neg for nitrites/leuks-did however have ketones and  ? Sp Grav suggesting vol depletion 2. History of multiple embolic strokes, first one 1995, status post CVA 04/15/2014-also documented history hyper homocystinemia, PFO and intraseptal no aneurysm. This patient represents an exorbitant risk for recurrent CVA-will need to continue on Xarelto regardless of hemoglobin levels 3. dilutional anemia-hemoccult 07/16/13 neg.  Transfusion trigger less than 8.0.  Repeat labs in am- 4. Chronic systolic heart failure + EF 25-30% 04/2014 + PPM Medtronic placement for 2/2 prevention V. tach, acute coronary death-patient seems euvolemic. As eating 50& breakfast, would cut back IV saline from 75 cc/h--.50 cc per hour 07/16/58 5. CAD S/P LAD stent-reasonable to continue aspirin + Xarelto at this stage. It appears he is not on any antecedent beta blocker or ACE  inhibitor-relative contraindication given tachybradycardia syndrome, neurocardiogenic syncope. 6. 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 This usually use for organic brain disease or Alzheimer's dementia.-Defer to outpatient physician regarding its utility 7. Benign prostatic hypertrophy-on rectal exam on admission no specific prostate enlargement. Continue Flomax 0.4 daily.  8. Risk for aspiration-multiple strokes-consider speech input if needed moving forward  Code Status: Full Family Communication: None at bedside [place] [phone]  [phone]   Called and updated Disposition Plan: tele today   Verneita Griffes, MD  [facility] Pager [phone] 07/17/2014, 8:15 AM    LOS: 1 day

## 2014-07-17 NOTE — Progress Notes (Signed)
UR Completed.  [phone]  

## 2014-07-17 NOTE — Discharge Instructions (Signed)
Information on my medicine - XARELTO® (rivaroxaban) ° °This medication education was reviewed with me or my healthcare representative as part of my discharge preparation.  The pharmacist that spoke with me during my hospital stay was:  Lancelot Alyea Stillinger, RPH ° °WHY WAS XARELTO® PRESCRIBED FOR YOU? °Xarelto® was prescribed to treat blood clots that may have been found in the veins of your legs (deep vein thrombosis) or in your lungs (pulmonary embolism) and to reduce the risk of them occurring again. ° °What do you need to know about Xarelto®? °The dose is one 20 mg tablet taken ONCE A DAY with your evening meal. ° °DO NOT stop taking Xarelto® without talking to the health care provider who prescribed the medication.  Refill your prescription for 20 mg tablets before you run out. ° °After discharge, you should have regular check-up appointments with your healthcare provider that is prescribing your Xarelto®.  In the future your dose may need to be changed if your kidney function changes by a significant amount. ° °What do you do if you miss a dose? °If you are taking Xarelto® TWICE DAILY and you miss a dose, take it as soon as you remember. You may take two 15 mg tablets (total 30 mg) at the same time then resume your regularly scheduled 15 mg twice daily the next day. ° °If you are taking Xarelto® ONCE DAILY and you miss a dose, take it as soon as you remember on the same day then continue your regularly scheduled once daily regimen the next day. Do not take two doses of Xarelto® at the same time.  ° °Important Safety Information °Xarelto® is a blood thinner medicine that can cause bleeding. You should call your healthcare provider right away if you experience any of the following: °? Bleeding from an injury or your nose that does not stop. °? Unusual colored urine (red or dark brown) or unusual colored stools (red or black). °? Unusual bruising for unknown reasons. °? A serious fall or if you hit your  head (even if there is no bleeding). ° °Some medicines may interact with Xarelto® and might increase your risk of bleeding while on Xarelto®. To help avoid this, consult your healthcare provider or pharmacist prior to using any new prescription or non-prescription medications, including herbals, vitamins, non-steroidal anti-inflammatory drugs (NSAIDs) and supplements. ° °This website has more information on Xarelto®: www.xarelto.com. ° °

## 2014-07-17 NOTE — Progress Notes (Signed)
Grant Lara [id number] Admission Data: 07/17/2014 10:59 AM Attending Provider: Nita Sells, MD  OBS:JGGEZ,MOQHUT ALEXANDER, MD Consults/ Treatment Team: Treatment Team:  Rounding Lbcardiology, MD  Grant Lara is a 74 y.o. male patient admitted from ED awake, alert  & orientated  X 3,  Full Code, VSS - Blood pressure 113/62, pulse 65, temperature 98 F (36.7 C), temperature source Oral, resp. rate 14, height 5' 9"  (1.753 m), weight 53.3 kg (117 lb 8.1 oz), SpO2 99 %.,no c/o shortness of breath, no c/o chest pain, no distress noted. Tele # 14 placed and pt is currently running:paced    IV site WDL:  forearm left, condition patent and no redness with a transparent dsg that's clean dry and intact.  Allergies:  No Known Allergies   Past Medical History  Diagnosis Date  . Hypertension   . Dilated cardiomyopathy     2/12: EF 30-35%, trivial AI, mild RAE.  EF 2014 40 -45%  . CAD (coronary artery disease)     LHC 9/05 with Dr. Einar Gip:  dLM 20-30%, LAD 85%, oD1 20-30%.  PCI:  Taxus DES to LAD; Dx jailed and tx with POBA.  Last myoview 12/10: inf scar, no ischemia, EF 29%.  . DVT (deep venous thrombosis)   . Pulmonary embolus     chronic coumadin  . Hyperhomocystinemia   . PFO (patent foramen ovale)     Not mentioned on 2014 echo.  [name] HLD (hyperlipidemia)   . Crohn's disease   . Nephrolithiasis   . Diabetes mellitus     11/05/11 "borderline; don't take medications"  . Stroke 1993    "left arm can't hold steady; leg too"  . Arthritis     "used to have a touch in my legs"  . Memory difficulties   . Pneumonia ~ 2011    09-22-13 denies any recent SOB or breathing problems  . Swelling of both ankles      09-22-13 occ.feet, but denies pain.  . Bradycardia 06/01/2014  . Dementia      Pt orientation to unit, room and routine. Information packet given to patient/family and safety video watched.  Admission INP armband ID verified with patient/family, and in place. SR up x 2, fall risk  assessment complete with Patient and family verbalizing understanding of risks associated with falls. Pt verbalizes an understanding of how to use the call bell and to call for help before getting out of bed.  Patient has stage II pressure ulcer on left buttock foam intact.      Will cont to monitor and assist as needed.  Toyia Jelinek Margaretha Sheffield, RN 07/17/2014 10:59 AM

## 2014-07-18 DIAGNOSIS — G903 Multi-system degeneration of the autonomic nervous system: Secondary | ICD-10-CM

## 2014-07-18 LAB — BASIC METABOLIC PANEL
Anion gap: 5 (ref 5–15)
BUN: 12 mg/dL (ref 6–23)
CHLORIDE: 109 mmol/L (ref 96–112)
CO2: 26 mmol/L (ref 19–32)
CREATININE: 1.09 mg/dL (ref 0.50–1.35)
Calcium: 8.7 mg/dL (ref 8.4–10.5)
GFR calc Af Amer: 75 mL/min — ABNORMAL LOW (ref >90)
GFR calc non Af Amer: 64 mL/min — ABNORMAL LOW (ref >90)
Glucose, Bld: 82 mg/dL (ref 70–99)
Potassium: 4 mmol/L (ref 3.5–5.1)
Sodium: 140 mmol/L (ref 135–145)

## 2014-07-18 LAB — CBC WITH DIFFERENTIAL/PLATELET
BASOS ABS: 0 10*3/uL (ref 0.0–0.1)
Basophils Relative: 1 % (ref 0–1)
EOS PCT: 3 % (ref 0–5)
Eosinophils Absolute: 0.2 10*3/uL (ref 0.0–0.7)
HCT: 26.4 % — ABNORMAL LOW (ref 39.0–52.0)
Hemoglobin: 8.5 g/dL — ABNORMAL LOW (ref 13.0–17.0)
LYMPHS ABS: 2 10*3/uL (ref 0.7–4.0)
Lymphocytes Relative: 37 % (ref 12–46)
MCH: 29.9 pg (ref 26.0–34.0)
MCHC: 32.2 g/dL (ref 30.0–36.0)
MCV: 93 fL (ref 78.0–100.0)
Monocytes Absolute: 0.4 10*3/uL (ref 0.1–1.0)
Monocytes Relative: 8 % (ref 3–12)
Neutro Abs: 2.7 10*3/uL (ref 1.7–7.7)
Neutrophils Relative %: 51 % (ref 43–77)
PLATELETS: 196 10*3/uL (ref 150–400)
RBC: 2.84 MIL/uL — ABNORMAL LOW (ref 4.22–5.81)
RDW: 14.6 % (ref 11.5–15.5)
WBC: 5.3 10*3/uL (ref 4.0–10.5)

## 2014-07-18 LAB — GLUCOSE, CAPILLARY
GLUCOSE-CAPILLARY: 66 mg/dL — AB (ref 70–99)
GLUCOSE-CAPILLARY: 140 mg/dL — AB (ref 70–99)
GLUCOSE-CAPILLARY: 102 mg/dL — AB (ref 70–99)
Glucose-Capillary: 93 mg/dL (ref 70–99)

## 2014-07-18 NOTE — Progress Notes (Signed)
Grant Lara RCV:[id number] DOB: April 21, 1940 DOA: 07/15/2014 PCP: [name], MD  Brief narrative:  74 y/o ? Hyperhomocysteinemia, multiple Embolic [first in 1025?] CVA 04/15/14 + prior DVT + bilate PE  4.11.2007 and CVA at that time/ new acute PE 8/29015 on Xarelto 11/2013, CAD s/p stent LAD,+ resultant Systolic HF EF 85-27% Jan 2016.  Had PPM placed 05/2014 for 2/2 prevention VT + Neurocardiogenic syncope DM ty 2. CKD III, Chr Sys HF + EF 25-30% Note that n a prior Echo-PFO was noted + intrasepetal anuersym  Antecedent Memory issues prior to CVA's as per chart on Aricept COPD SBO 07/2013 Documented Upper lung lesion 10/2011 Wife on phone tells me he was walking towards RR and he stumbled-Before he fell tot he floor was caught by wife.  Seems "out of it"  Got clammy and sweaty--wouldn'[t follow command.  Couldn't understand what he was saying.  He was out of it for ~ 2-3 min--Wouldn't sit up by himself. Paramedics put IV iron-Smile per wife was altered Speech was slurred-this lasted ~ 10-15 min  Past medical history-As per Problem list Chart reviewed as below- reviewed  Consultants:  none  Procedures:  none  Antibiotics:  none   Subjective    doing fair. Alert oriented   eating about 10-15% of meals and not hungry   sleepy but arouses easily   understands some of what we are discussing but clear deficits still noted   had hypoglycemia early this morning 53 but no other issues per nursing   Objective    Interim History:   Telemetry: AV paced rhythm   Objective: Filed Vitals:   07/17/14 2137 07/18/14 0514 07/18/14 1001 07/18/14 1320  BP: 125/62 138/63 115/52 106/53  Pulse: 63 57 67 114  Temp: 98.4 F (36.9 C) 97.3 F (36.3 C) 98.1 F (36.7 C) 98.2 F (36.8 C)  TempSrc: Oral Oral Oral Oral  Resp: 16  14 18   Height:      Weight:      SpO2: 100% 100% 100% 100%    Intake/Output Summary (Last 24 hours) at 07/18/14 1411 Last data filed at 07/18/14  0957  Gross per 24 hour  Intake 1053.75 ml  Output   1170 ml  Net -116.25 ml    Exam:  General: Frail, bilateral temporalis, supraclavicular wasting, moderate dentition, Mallampati 2 Cardiovascular: S1-S2 no murmur rub or gallop cannot really appreciate any bruit, mild JVD Respiratory: Clinically clear, no added sound Abdomen: Soft nontender no distended no rebound, rectal = stool masses, no dark stool on glove   Data Reviewed: Basic Metabolic Panel:  Recent Labs Lab 07/15/14 2325 07/16/14 0411 07/18/14 0614  NA 138 140 140  K 4.4 4.0 4.0  CL 109 113* 109  CO2 26 28 26   GLUCOSE 279* 171* 82  BUN 20 19 12   CREATININE 1.50* 1.36* 1.09  CALCIUM 8.5 8.5 8.7   Liver Function Tests:  Recent Labs Lab 07/15/14 2325  AST 28  ALT 51  ALKPHOS 92  BILITOT 0.4  PROT 5.3*  ALBUMIN 2.6*   No results for input(s): LIPASE, AMYLASE in the last 168 hours. No results for input(s): AMMONIA in the last 168 hours. CBC:  Recent Labs Lab 07/15/14 2325 07/16/14 0411 07/18/14 0614  WBC 7.9 7.4 5.3  NEUTROABS 6.5  --  2.7  HGB 8.4* 8.0* 8.5*  HCT 26.1* 25.2* 26.4*  MCV 94.6 95.5 93.0  PLT 195 179 196   Cardiac Enzymes:  Recent Labs Lab 07/15/14 2325  07/16/14 0411 07/16/14 0935  TROPONINI <0.03 <0.03 <0.03   BNP: Invalid input(s): POCBNP CBG:  Recent Labs Lab 07/17/14 1223 07/17/14 1758 07/17/14 2236 07/18/14 0805 07/18/14 1136  GLUCAP 170* 83 98 66* 140*    Recent Results (from the past 240 hour(s))  Blood culture (routine x 2)     Status: None (Preliminary result)   Collection Time: 07/15/14 11:27 PM  Result Value Ref Range Status   Specimen Description BLOOD RIGHT ARM  Final   Special Requests BOTTLES DRAWN AEROBIC ONLY 2CC  Final   Culture   Final           BLOOD CULTURE RECEIVED NO GROWTH TO DATE CULTURE WILL BE HELD FOR 5 DAYS BEFORE ISSUING A FINAL NEGATIVE REPORT Performed at [facility]    Report Status PENDING  Incomplete  Blood  culture (routine x 2)     Status: None (Preliminary result)   Collection Time: 07/16/14 12:42 AM  Result Value Ref Range Status   Specimen Description BLOOD RIGHT FOREARM  Final   Special Requests BOTTLES DRAWN AEROBIC AND ANAEROBIC 5CC  Final   Culture   Final           BLOOD CULTURE RECEIVED NO GROWTH TO DATE CULTURE WILL BE HELD FOR 5 DAYS BEFORE ISSUING A FINAL NEGATIVE REPORT Performed at [facility]    Report Status PENDING  Incomplete  Urine culture     Status: None   Collection Time: 07/16/14 12:56 AM  Result Value Ref Range Status   Specimen Description URINE, RANDOM  Final   Special Requests NONE  Final   Colony Count NO GROWTH Performed at [facility]   Final   Culture NO GROWTH Performed at [facility]   Final   Report Status 07/17/2014 FINAL  Final  MRSA PCR Screening     Status: None   Collection Time: 07/16/14  3:40 AM  Result Value Ref Range Status   MRSA by PCR NEGATIVE NEGATIVE Final    Comment:        The GeneXpert MRSA Assay (FDA approved for NASAL specimens only), is one component of a comprehensive MRSA colonization surveillance program. It is not intended to diagnose MRSA infection nor to guide or monitor treatment for MRSA infections.      Studies:              All Imaging reviewed and is as per above notation   Scheduled Meds: . aspirin EC  81 mg Oral Daily  . atorvastatin  10 mg Oral q1800  . donepezil  5 mg Oral QHS  . insulin aspart  0-9 Units Subcutaneous TID WC  . latanoprost  1 drop Both Eyes QHS  . rivaroxaban  20 mg Oral Q supper  . tamsulosin  0.4 mg Oral Daily   Continuous Infusions: . sodium chloride 75 mL/hr at 07/17/14 0600     Assessment/Plan:   1. Syncope?-Cause-neurocardiogenic versus other-patient also has dropped hemoglobin--I've hemocculted stool and this is negative-suspect hemodilution. PPM interrogation 4/2 wnl.  appreciate cardiology. patient's last presentation to the hospital in  05/2014 was remarkably similar to this one wherein patient was hypothermic+ LOC-unlikely infectious at this stage other than one vital sign i.e. hypothermia.addition of alpha-2 antagonist as well as anticholinergic medications could also cause reasons for syncope.  CT head 4/2 no acute CVA.  Cardiology feels this is neuro-cardiogenic syncope.  TED hose thigh high, Orthostatics.  Will consider florinef/midodrine  2. ? Sepsis-doesn't have  Pyelo-UA neg for nitrites/leuks-did however have ketones and  ? Sp Grav suggesting vol depletion-- antibiotics have been discontinued on 4/4. We will monitor fever curve.  3.  Lung nodule?-He does have on chest x-ray 07/17/14 ? Irregular nodular opacity right mid lung - CT scan of the chest 04/2014 remarkable for resolving pulmonary emboli. No further workup at this stage. Repeat chest x-ray 4-6 weeks versus CT scan low dose contrast 4. History of multiple embolic strokes, first one 1995, status post CVA 04/15/2014-also documented history hyper homocystinemia, PFO and intraseptal no aneurysm. This patient represents an exorbitant risk for recurrent CVA-will need to continue on Xarelto regardless of hemoglobin levels 5. dilutional anemia-hemoccult 07/16/13 neg.  Transfusion trigger less than 8.0.  Repeat labs in am- 6. Chronic systolic heart failure + EF 25-30% 04/2014 + PPM Medtronic placement for 2/2 prevention V. tach, acute coronary death-patient seems euvolemic.  IV saline cut back and ultimately discontinued 4/4 7. CAD S/P LAD stent-reasonable to continue aspirin + Xarelto at this stage. It appears he is not on any antecedent beta blocker or ACE inhibitor-relative contraindication given tachybradycardia syndrome, neurocardiogenic syncope. 8. 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 This usually use for organic brain disease or Alzheimer's dementia.-Defer to outpatient physician regarding its utility 9. Benign prostatic  hypertrophy-on rectal exam on admission no specific prostate enlargement. Continue Flomax 0.4 daily.  10. Risk for aspiration-multiple strokes-consider speech input if needed moving forward  Code Status: Full Family Communication: called wife and discussed [place]  [phone]  Disposition Plan: tele  PT/ OT input pending   Verneita Griffes, MD  [facility] Pager [phone] 07/18/2014, 2:11 PM    LOS: 2 days

## 2014-07-18 NOTE — Evaluation (Signed)
Physical Therapy Evaluation Patient Details Name: Grant Lara MRN: [id number] DOB: 04-06-1941 Today's Date: 07/18/2014   History of Present Illness  Grant Lara is a 74 y.o. male with a history of an Embolic CVA with Left hemiparesis 04/2014, PFO, CAD, HTN, DM2, Systolic CHF, Stage II CKD, Crohns Dz, and history of Pulmonary Embolism who was in his home and having dinner and he stood up and passed out. His family called EMS, and EMS found him to be hypotensive with a systolic Blood pressure in the 70's. He was administered a bolus of IVFs and had improvement. In the ED , he was found to also have hypothermia and was placed on the warming blanket. A Sepsis workup was initiated and he was placed on IV Vancomycin and Zosyn and referred for medical admission.  Clinical Impression  Pt admitted with/for syncope.  Pt currently limited functionally due to the problems listed below.  (see problems list.)  Pt will benefit from PT to maximize function and safety to be able to get home safely with available assist of family.     Follow Up Recommendations Home health PT    Equipment Recommendations  Other (comment) (TBA)    Recommendations for Other Services       Precautions / Restrictions Precautions Precautions: Fall Restrictions Weight Bearing Restrictions: No      Mobility  Bed Mobility Overal bed mobility: Needs Assistance Bed Mobility: Supine to Sit     Supine to sit: Supervision        Transfers Overall transfer level: Needs assistance   Transfers: Sit to/from Stand Sit to Stand: Min guard         General transfer comment: safe technique  Ambulation/Gait Ambulation/Gait assistance: Min assist Ambulation Distance (Feet): 140 Feet Assistive device:  (iv pole) Gait Pattern/deviations: Step-through pattern Gait velocity: slower   General Gait Details: mildly paretic on the L, but generally steady and stable  Stairs            Wheelchair Mobility     Modified Rankin (Stroke Patients Only)       Balance Overall balance assessment: Needs assistance Sitting-balance support: No upper extremity supported Sitting balance-Leahy Scale: Fair     Standing balance support: No upper extremity supported Standing balance-Leahy Scale: Fair Standing balance comment: based on reaching for soap and towels during washng hands                             Pertinent Vitals/Pain Pain Assessment: No/denies pain    Home Living Family/patient expects to be discharged to:: Private residence Living Arrangements: Spouse/significant other Available Help at Discharge: Family Type of Home: House Home Access: Stairs to enter Entrance Stairs-Rails: None Technical brewer of Steps: several Home Layout: One level Home Equipment: Other (comment) (TBA, but pt stated he doesn't use devices) Additional Comments: pt with expressive difficulties and no family present    Prior Function Level of Independence: Independent         Comments: pt related needed at little assist for bathing     Hand Dominance   Dominant Hand: Right    Extremity/Trunk Assessment               Lower Extremity Assessment: RLE deficits/detail;LLE deficits/detail RLE Deficits / Details: WFL  difficult to MMT LLE Deficits / Details: mildly paretic moving in synergistic patterns     Communication   Communication: Expressive difficulties  Cognition Arousal/Alertness: Awake/alert Behavior During  Therapy: WFL for tasks assessed/performed Overall Cognitive Status:  (per chart may be a hx of cognitive impairments--no family)                      General Comments      Exercises        Assessment/Plan    PT Assessment Patient needs continued PT services  PT Diagnosis Abnormality of gait   PT Problem List Decreased strength;Decreased activity tolerance;Decreased balance;Decreased mobility;Decreased coordination  PT Treatment  Interventions Gait training;DME instruction;Functional mobility training;Therapeutic activities;Balance training;Patient/family education   PT Goals (Current goals can be found in the Care Plan section) Acute Rehab PT Goals Patient Stated Goal: did not state PT Goal Formulation: With patient Time For Goal Achievement: 07/25/14 Potential to Achieve Goals: Fair    Frequency Min 3X/week   Barriers to discharge        Co-evaluation               End of Session   Activity Tolerance: Patient tolerated treatment well Patient left: in chair;with call bell/phone within reach Nurse Communication: Mobility status         Time: 1544-1610 PT Time Calculation (min) (ACUTE ONLY): 26 min   Charges:   PT Evaluation $Initial PT Evaluation Tier I: 1 Procedure PT Treatments $Gait Training: 8-22 mins   PT G Codes:        Gabriela Giannelli, Tessie Fass 07/18/2014, 4:29 PM 07/18/2014  Donnella Sham, PT [phone]  (pager)

## 2014-07-19 DIAGNOSIS — A419 Sepsis, unspecified organism: Secondary | ICD-10-CM

## 2014-07-19 LAB — GLUCOSE, CAPILLARY
GLUCOSE-CAPILLARY: 77 mg/dL (ref 70–99)
GLUCOSE-CAPILLARY: 138 mg/dL — AB (ref 70–99)

## 2014-07-19 NOTE — Progress Notes (Signed)
CARE MANAGEMENT NOTE 07/19/2014  Patient:  Grant Lara, Grant Lara   Account Number:  [id number]  Date Initiated:  07/19/2014  Documentation initiated by:  [facility]  Subjective/Objective Assessment:   Syncope     Action/Plan:   home with wife   Anticipated DC Date:  07/19/2014   Anticipated DC Plan:  [place]  CM consult      [facility] Choice  HOME HEALTH   Choice offered to / List presented to:  C-3 Spouse        HH arranged  HH-1 RN  [place]      [place] agency  [employer]   Status of service:  Completed, signed off Medicare Important Message given?  YES (If response is "NO", the following Medicare IM given date fields will be blank) Date Medicare IM given:  07/19/2014 Medicare IM given by:  [facility] Date Additional Medicare IM given:   Additional Medicare IM given by:    Discharge Disposition:  [place]  Per UR Regulation:    If discussed at Long Length of Stay Meetings, dates discussed:    Comments:  07/19/2014 1230 NCM spoke to pt and gave permission to speak wife. Offered choice for [facility]. Provided HHA list. Wife states pt had [place] in the past and they are requesting [place]. Contacted [employer] rep for new referral. No DME needed for home. Jonnie Finner  RN CCM Case Mgmt phone [phone]

## 2014-07-19 NOTE — Progress Notes (Deleted)
Physician Discharge Summary  Grant Lara EXH:[id number] DOB: 11-Nov-1940 DOA: 07/15/2014  PCP: [name], MD  Admit date: 07/15/2014 Discharge date: 07/19/2014  Time spent: 45 minutes  Recommendations for Outpatient Follow-up:  1. Recommend c regular sodium amount in diet and at least 2 liters fluid per day 2. recommend consideration midodrine/FLorinef as OP.  Continue TED hose  3. Rec OP Cardiology f/o for PPM checks and further management-CC Cardiologist with this d/c note 4. 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  Had PPM placed 05/2014 for 2/2 prevention VT + Neurocardiogenic syncope DM ty 2. CKD III, Chr Sys HF + EF 25-30% Note that n a prior Echo-PFO was noted + intrasepetal anuersym   Antecedent Memory issues prior to CVA's as per chart on Aricept COPD SBO 07/2013 Documented Upper lung lesion 10/2011 Wife on phone tells me he was walking towards RR and he stumbled-Before he fell tot he floor was caught by wife.  Seems "out of it"  Got clammy and sweaty--wouldn'[t follow command.  Couldn't understand what he was saying.  He was out of it for ~ 2-3 min--Wouldn't sit up by  himself. Paramedics put IV iron-Smile per wife was altered Speech was slurred-this lasted ~ 10-15 min  Present illness:   1. Syncope-Cause-neurocardiogenic -patient also has dropped hemoglobin--I've hemocculted stool and this is negative-suspect hemodilution. PPM interrogation 4/2 wnl.  Appreciate cardiology. patient's last presentation to the hospital in 05/2014 was remarkably similar to this one wherein patient was hypothermic+ LOC-unlikely infectious at this stage other than one vital sign i.e. Hypothermia. Addition of alpha-2 antagonist as well as anticholinergic medications could also cause reasons for syncope.  CT head 4/2 no acute CVA.  Cardiology saw patient and felt this is neuro-cardiogenic syncope.  TED hose thigh high, [facility].  Will consider florinef/midodrine moving forward as an OP 2. Unlikely sepsis -doesn't have Pyelo-UA neg for nitrites/leuks-did however have ketones and  ? Sp Grav suggesting vol depletion-- antibiotics have been discontinued on 4/4. We will monitor fever curve.   3. Lung nodule? + Pulmonary emboli-He does have on chest x-ray 07/17/14 ? Irregular nodular opacity right mid lung - CT scan of the chest 04/2014 remarkable for resolving pulmonary emboli. No further workup at this stage. Repeat chest x-ray 4-6 weeks versus CT scan low dose contrast 4. History of multiple embolic strokes, first one 1995, status post CVA 04/15/2014-also documented history hyper homocystinemia, PFO and intraseptal no aneurysm. This patient represents an exorbitant risk for recurrent CVA-will need to continue on Xarelto regardless of hemoglobin levels 5. dilutional anemia-hemoccult 07/16/13 neg.  Transfusion trigger less than 8.0.  Repeat labs on d/c showed Hb 8.4 therefore no  needs transfusion 6. Chronic systolic heart failure + ISch CM EF 25-30% 04/2014 + PPM Medtr[employer] placement for 2/2 prevention V. tach, acute coronary death-patient seems euvolemic.  IV saline cut back and ultimately discontinued  4/4 7. CAD S/P LAD sten + PCIt-reasonable to continue aspirin + Xarelto at this stage. It appears he is not on any antecedent beta blocker or ACE inhibitor-relative contraindication given tachybradycardia syndrome, neurocardiogenic syncope. 8. 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 This usually use for organic brain disease or Alzheimer's dementia.-Defer to outpatient physician regarding its utility 9. Benign prostatic hypertrophy-on rectal exam on admission no specific prostate enlargement. Continue Flomax 0.4 daily.   10. Risk for aspiration-multiple strokes-consider speech input if needed moving forward 11. Moderate hypoglycemia-wouldn't cover him with any insulin/analogues as OP--did not contribute to symptoms on admit   Consultations:  Cardiology  Discharge Exam: Filed Vitals:   07/19/14 0612  BP: 108/61  Pulse: 64  Temp: 98.2 F (36.8 C)  Resp: 15    General: fair, sparse speech, no other issues Cardiovascular: s1 s 2no m/r/g Respiratory: clear Slow halting speech  Discharge Instructions   Discharge Instructions    Diet general    Complete by:  As directed   Do not eat a low-salt diet and I want you to drink 2 L of fluid a day     Discharge instructions    Complete by:  As directed   You were diagnosed with dizziness as well as fainting spells and falls probably from low blood pressure It was felt that your low blood sugars could've caused the dizziness that he initially had I would recommend as an outpatient talk with her primary care physician about various medications that can help increase her blood pressure I think you're stable to go home and will need close follow-up with her primary care physician          Current Discharge Medication List    CONTINUE these medications which have NOT CHANGED   Details  acetaminophen (TYLENOL) 325 MG tablet Take 2 tablets (650 mg total) by mouth every 6 (six)  hours as needed for mild pain, moderate pain or fever.    atorvastatin (LIPITOR) 10 MG tablet Take 1 tablet (10 mg total) by mouth daily at 6 PM. Qty: 30 tablet, Refills: 0   Associated Diagnoses: Hyperlipidemia    donepezil (ARICEPT) 5 MG tablet Take 5 mg by mouth at bedtime.    latanoprost (XALATAN) 0.005 % ophthalmic solution Place 1 drop into both eyes at bedtime.    rivaroxaban (XARELTO) 20 MG TABS tablet Take 1 tablet (20 mg total) by mouth daily with supper. Qty: 30 tablet    sertraline (ZOLOFT) 50 MG tablet Take 50 mg by mouth at bedtime.     tamsulosin (FLOMAX) 0.4 MG CAPS capsule Take 1 capsule (0.4 mg total) by mouth daily. Qty: 30 capsule, Refills: 0    aspirin EC 81 MG EC tablet Take 1 tablet (81 mg total) by mouth daily. Qty: 30 tablet, Refills: 0    insulin aspart (NOVOLOG) 100 UNIT/ML injection CBG 70 - 120: 0 units CBG 121 - 150: 1 unit CBG 151 - 200: 2 units CBG 201 - 250: 3 units CBG 251 - 300: 5 units CBG 301 - 350: 7 units CBG 351 - 400: 9 units Qty: 10 mL, Refills: 1      STOP taking these medications     glucose monitoring kit (FREESTYLE) monitoring kit  No Known Allergies    The results of significant diagnostics from this hospitalization (including imaging, microbiology, ancillary and laboratory) are listed below for reference.    Significant Diagnostic Studies: Dg Chest 2 View  07/17/2014   CLINICAL DATA:  Aspiration into the airway. Chronic systolic heart failure.  EXAM: CHEST  2 VIEW  COMPARISON:  07/15/2014  FINDINGS: Heart size remains within normal limits. Pacemaker remains in appropriate position.  Irregular nodular opacity is seen in the right midlung measuring approximately 2.4 cm, which is more prominent and dense compared to previous exam. Infectious or inflammatory infiltrate and pulmonary neoplasm cannot be excluded. Left lung is clear. No definite hilar or mediastinal masses are identified.  IMPRESSION: Increased prominence of  irregular nodular opacity in the right midlung. Differential diagnosis includes pulmonary neoplasm and infectious or inflammatory processes. Recommend clinical correlation, and consider continued chest radiographic follow-up or chest CT without contrast for further evaluation.   Electronically Signed   By: Earle Gell M.D.   On: 07/17/2014 11:09   Dg Chest 2 View  07/16/2014   CLINICAL DATA:  Near syncopal episode when coming out of bathroom. No fall. Recent stroke.  EXAM: CHEST  2 VIEW  COMPARISON:  Chest radiograph June 04, 2014  FINDINGS: Cardiac silhouette is mildly enlarged, mediastinal silhouette is nonsuspicious. Dual lead LEFT cardiac defibrillator. No pleural effusions or focal consolidations. No pneumothorax. Soft tissue planes and included osseous structures are nonsuspicious.  IMPRESSION: Mild cardiomegaly, no acute pulmonary process.   Electronically Signed   By: Elon Alas   On: 07/16/2014 00:03   Ct Head Wo Contrast  07/16/2014   CLINICAL DATA:  Syncopal episode.  Headache.  EXAM: CT HEAD WITHOUT CONTRAST  TECHNIQUE: Contiguous axial images were obtained from the base of the skull through the vertex without intravenous contrast.  COMPARISON:  MRI of 05/30/2014 and CT of 05/30/2014.  FINDINGS: Sinuses/Soft tissues: Cerumen in the left external ear canal. Clear paranasal sinuses and mastoid air cells.  Intracranial: Moderate to marked low density in the periventricular white matter likely related to small vessel disease. Advanced cerebral atrophy. Bilateral remote occipital lobe infarcts. Remote left cerebellar hemispheric infarct. Bilateral frontal and parietal remote infarcts. Ventriculomegaly, felt to be secondary to cerebral atrophy. Right basal ganglia remote lacunar infarct. No mass lesion, hemorrhage, hydrocephalus, acute infarct, intra-axial, or extra-axial fluid collection.  IMPRESSION: 1. Cerebral atrophy with extensive multi territory remote infarcts. Chronic small vessel  ischemic change. No convincing evidence of acute superimposed process. 2. As detailed on the prior MRI of 23/76/2831, embolic disease should be considered.   Electronically Signed   By: Abigail Miyamoto M.D.   On: 07/16/2014 12:41    Microbiology: Recent Results (from the past 240 hour(s))  Blood culture (routine x 2)     Status: None (Preliminary result)   Collection Time: 07/15/14 11:27 PM  Result Value Ref Range Status   Specimen Description BLOOD RIGHT ARM  Final   Special Requests BOTTLES DRAWN AEROBIC ONLY 2CC  Final   Culture   Final           BLOOD CULTURE RECEIVED NO GROWTH TO DATE CULTURE WILL BE HELD FOR 5 DAYS BEFORE ISSUING A FINAL NEGATIVE REPORT Performed at [facility]    Report Status PENDING  Incomplete  Blood culture (routine x 2)     Status: None (Preliminary result)   Collection Time: 07/16/14 12:42 AM  Result Value Ref Range Status   Specimen Description BLOOD RIGHT FOREARM  Final  Special Requests BOTTLES DRAWN AEROBIC AND ANAEROBIC 5CC  Final   Culture   Final           BLOOD CULTURE RECEIVED NO GROWTH TO DATE CULTURE WILL BE HELD FOR 5 DAYS BEFORE ISSUING A FINAL NEGATIVE REPORT Performed at [facility]    Report Status PENDING  Incomplete  Urine culture     Status: None   Collection Time: 07/16/14 12:56 AM  Result Value Ref Range Status   Specimen Description URINE, RANDOM  Final   Special Requests NONE  Final   Colony Count NO GROWTH Performed at [facility]   Final   Culture NO GROWTH Performed at [facility]   Final   Report Status 07/17/2014 FINAL  Final  MRSA PCR Screening     Status: None   Collection Time: 07/16/14  3:40 AM  Result Value Ref Range Status   MRSA by PCR NEGATIVE NEGATIVE Final    Comment:        The GeneXpert MRSA Assay (FDA approved for NASAL specimens only), is one component of a comprehensive MRSA colonization surveillance program. It is not intended to diagnose MRSA infection nor to  guide or monitor treatment for MRSA infections.      Labs: Basic Metabolic Panel:  Recent Labs Lab 07/15/14 2325 07/16/14 0411 07/18/14 0614  NA 138 140 140  K 4.4 4.0 4.0  CL 109 113* 109  CO2 26 28 26   GLUCOSE 279* 171* 82  BUN 20 19 12   CREATININE 1.50* 1.36* 1.09  CALCIUM 8.5 8.5 8.7   Liver Function Tests:  Recent Labs Lab 07/15/14 2325  AST 28  ALT 51  ALKPHOS 92  BILITOT 0.4  PROT 5.3*  ALBUMIN 2.6*   No results for input(s): LIPASE, AMYLASE in the last 168 hours. No results for input(s): AMMONIA in the last 168 hours. CBC:  Recent Labs Lab 07/15/14 2325 07/16/14 0411 07/18/14 0614  WBC 7.9 7.4 5.3  NEUTROABS 6.5  --  2.7  HGB 8.4* 8.0* 8.5*  HCT 26.1* 25.2* 26.4*  MCV 94.6 95.5 93.0  PLT 195 179 196   Cardiac Enzymes:  Recent Labs Lab 07/15/14 2325 07/16/14 0411 07/16/14 0935  TROPONINI <0.03 <0.03 <0.03   BNP: BNP (last 3 results) No results for input(s): BNP in the last 8760 hours.  ProBNP (last 3 results) No results for input(s): PROBNP in the last 8760 hours.  CBG:  Recent Labs Lab 07/18/14 0805 07/18/14 1136 07/18/14 1651 07/18/14 2211 07/19/14 0758  GLUCAP 66* 140* 93 102* 77       Signed:  Byrd Lara, [place]  [facility] 07/19/2014, 10:04 AM

## 2014-07-19 NOTE — Progress Notes (Signed)
Patient discharge teaching given, including activity, diet, follow-up appoints, and medications. Patient verbalized understanding of all discharge instructions. IV access was d/c'd. Vitals are stable. Skin is intact except as charted in most recent assessments. Pt to be escorted out by NT, to be driven home by family. 

## 2014-07-20 DIAGNOSIS — R55 Syncope and collapse: Secondary | ICD-10-CM | POA: Diagnosis not present

## 2014-07-20 DIAGNOSIS — E119 Type 2 diabetes mellitus without complications: Secondary | ICD-10-CM | POA: Diagnosis not present

## 2014-07-20 DIAGNOSIS — I2510 Atherosclerotic heart disease of native coronary artery without angina pectoris: Secondary | ICD-10-CM | POA: Diagnosis not present

## 2014-07-20 DIAGNOSIS — D649 Anemia, unspecified: Secondary | ICD-10-CM | POA: Diagnosis not present

## 2014-07-20 DIAGNOSIS — I5022 Chronic systolic (congestive) heart failure: Secondary | ICD-10-CM | POA: Diagnosis not present

## 2014-07-20 DIAGNOSIS — I959 Hypotension, unspecified: Secondary | ICD-10-CM | POA: Diagnosis not present

## 2014-07-22 LAB — CULTURE, BLOOD (ROUTINE X 2)
Culture: NO GROWTH
Culture: NO GROWTH

## 2014-07-27 ENCOUNTER — Emergency Department (HOSPITAL_COMMUNITY): Payer: Medicare Other

## 2014-07-27 ENCOUNTER — Encounter (HOSPITAL_COMMUNITY): Payer: Self-pay | Admitting: Neurology

## 2014-07-27 ENCOUNTER — Emergency Department (HOSPITAL_COMMUNITY)
Admission: EM | Admit: 2014-07-27 | Discharge: 2014-07-27 | Disposition: A | Discharge status: Home / Self Care | Payer: Medicare Other | Attending: Emergency Medicine | Admitting: Emergency Medicine

## 2014-07-27 DIAGNOSIS — Z95810 Presence of automatic (implantable) cardiac defibrillator: Secondary | ICD-10-CM | POA: Insufficient documentation

## 2014-07-27 DIAGNOSIS — Z86718 Personal history of other venous thrombosis and embolism: Secondary | ICD-10-CM | POA: Diagnosis not present

## 2014-07-27 DIAGNOSIS — Z8701 Personal history of pneumonia (recurrent): Secondary | ICD-10-CM | POA: Diagnosis not present

## 2014-07-27 DIAGNOSIS — Z79899 Other long term (current) drug therapy: Secondary | ICD-10-CM | POA: Diagnosis not present

## 2014-07-27 DIAGNOSIS — Z720 Tobacco use: Secondary | ICD-10-CM | POA: Insufficient documentation

## 2014-07-27 DIAGNOSIS — A419 Sepsis, unspecified organism: Secondary | ICD-10-CM

## 2014-07-27 DIAGNOSIS — R55 Syncope and collapse: Secondary | ICD-10-CM | POA: Diagnosis not present

## 2014-07-27 DIAGNOSIS — Z8739 Personal history of other diseases of the musculoskeletal system and connective tissue: Secondary | ICD-10-CM | POA: Insufficient documentation

## 2014-07-27 DIAGNOSIS — F0390 Unspecified dementia without behavioral disturbance: Secondary | ICD-10-CM | POA: Diagnosis not present

## 2014-07-27 DIAGNOSIS — E119 Type 2 diabetes mellitus without complications: Secondary | ICD-10-CM | POA: Insufficient documentation

## 2014-07-27 DIAGNOSIS — M1990 Unspecified osteoarthritis, unspecified site: Secondary | ICD-10-CM | POA: Insufficient documentation

## 2014-07-27 DIAGNOSIS — Z8719 Personal history of other diseases of the digestive system: Secondary | ICD-10-CM | POA: Diagnosis not present

## 2014-07-27 DIAGNOSIS — Z8673 Personal history of transient ischemic attack (TIA), and cerebral infarction without residual deficits: Secondary | ICD-10-CM | POA: Insufficient documentation

## 2014-07-27 DIAGNOSIS — Z7901 Long term (current) use of anticoagulants: Secondary | ICD-10-CM | POA: Diagnosis not present

## 2014-07-27 DIAGNOSIS — Z86711 Personal history of pulmonary embolism: Secondary | ICD-10-CM | POA: Diagnosis not present

## 2014-07-27 DIAGNOSIS — I10 Essential (primary) hypertension: Secondary | ICD-10-CM | POA: Diagnosis not present

## 2014-07-27 DIAGNOSIS — I5022 Chronic systolic (congestive) heart failure: Secondary | ICD-10-CM | POA: Diagnosis not present

## 2014-07-27 DIAGNOSIS — Z7982 Long term (current) use of aspirin: Secondary | ICD-10-CM | POA: Diagnosis not present

## 2014-07-27 DIAGNOSIS — I9589 Other hypotension: Secondary | ICD-10-CM

## 2014-07-27 DIAGNOSIS — Q211 Atrial septal defect: Secondary | ICD-10-CM | POA: Insufficient documentation

## 2014-07-27 DIAGNOSIS — Z794 Long term (current) use of insulin: Secondary | ICD-10-CM | POA: Insufficient documentation

## 2014-07-27 DIAGNOSIS — E785 Hyperlipidemia, unspecified: Secondary | ICD-10-CM | POA: Insufficient documentation

## 2014-07-27 DIAGNOSIS — Z87442 Personal history of urinary calculi: Secondary | ICD-10-CM | POA: Insufficient documentation

## 2014-07-27 DIAGNOSIS — E7211 Homocystinuria: Secondary | ICD-10-CM | POA: Insufficient documentation

## 2014-07-27 LAB — BASIC METABOLIC PANEL
ANION GAP: 8 (ref 5–15)
BUN: 16 mg/dL (ref 6–23)
CALCIUM: 8.9 mg/dL (ref 8.4–10.5)
CO2: 27 mmol/L (ref 19–32)
CREATININE: 1.28 mg/dL (ref 0.50–1.35)
Chloride: 105 mmol/L (ref 96–112)
GFR calc Af Amer: 61 mL/min — ABNORMAL LOW (ref >90)
GFR, EST NON AFRICAN AMERICAN: 53 mL/min — AB (ref >90)
GLUCOSE: 107 mg/dL — AB (ref 70–99)
Potassium: 3.9 mmol/L (ref 3.5–5.1)
Sodium: 140 mmol/L (ref 135–145)

## 2014-07-27 LAB — I-STAT TROPONIN, ED: Troponin i, poc: 0 ng/mL (ref 0.00–0.08)

## 2014-07-27 LAB — CBC WITH DIFFERENTIAL/PLATELET
BASOS ABS: 0 10*3/uL (ref 0.0–0.1)
Basophils Relative: 1 % (ref 0–1)
EOS PCT: 1 % (ref 0–5)
Eosinophils Absolute: 0 10*3/uL (ref 0.0–0.7)
HEMATOCRIT: 30.6 % — AB (ref 39.0–52.0)
Hemoglobin: 9.6 g/dL — ABNORMAL LOW (ref 13.0–17.0)
LYMPHS PCT: 12 % (ref 12–46)
Lymphs Abs: 0.9 10*3/uL (ref 0.7–4.0)
MCH: 29 pg (ref 26.0–34.0)
MCHC: 31.4 g/dL (ref 30.0–36.0)
MCV: 92.4 fL (ref 78.0–100.0)
Monocytes Absolute: 0.5 10*3/uL (ref 0.1–1.0)
Monocytes Relative: 7 % (ref 3–12)
Neutro Abs: 6.3 10*3/uL (ref 1.7–7.7)
Neutrophils Relative %: 81 % — ABNORMAL HIGH (ref 43–77)
PLATELETS: 210 10*3/uL (ref 150–400)
RBC: 3.31 MIL/uL — ABNORMAL LOW (ref 4.22–5.81)
RDW: 13.8 % (ref 11.5–15.5)
WBC: 7.8 10*3/uL (ref 4.0–10.5)

## 2014-07-27 LAB — CBG MONITORING, ED: Glucose-Capillary: 112 mg/dL — ABNORMAL HIGH (ref 70–99)

## 2014-07-27 NOTE — ED Notes (Signed)
Per EMS- pt coming from home where he had just finished PT and while sitting in the chair at home slumped over. Family member put pt in the floor and she reports pt was not responding during this time. When fire dept arrived pt was alert and responding. Has hx of dementia. Denies pain at current. BP 100/70, HR 60 paced, EMS reports when they began to move him around HR increased  To 110. CBG 205. Family reports recent hospitalization for same syncope.

## 2014-07-27 NOTE — ED Notes (Signed)
MD resident at bedside

## 2014-07-27 NOTE — Discharge Instructions (Signed)
Near-Syncope Near-syncope (commonly known as near fainting) is sudden weakness, dizziness, or feeling like you might pass out. During an episode of near-syncope, you may also develop pale skin, have tunnel vision, or feel sick to your stomach (nauseous). Near-syncope may occur when getting up after sitting or while standing for a long time. It is caused by a sudden decrease in blood flow to the brain. This decrease can result from various causes or triggers, most of which are not serious. However, because near-syncope can sometimes be a sign of something serious, a medical evaluation is required. The specific cause is often not determined. HOME CARE INSTRUCTIONS  Monitor your condition for any changes. The following actions may help to alleviate any discomfort you are experiencing:  Have someone stay with you until you feel stable.  Lie down right away and prop your feet up if you start feeling like you might faint. Breathe deeply and steadily. Wait until all the symptoms have passed. Most of these episodes last only a few minutes. You may feel tired for several hours.   Drink enough fluids to keep your urine clear or pale yellow.   If you are taking blood pressure or heart medicine, get up slowly when seated or lying down. Take several minutes to sit and then stand. This can reduce dizziness.  Follow up with your health care provider as directed. SEEK IMMEDIATE MEDICAL CARE IF:   You have a severe headache.   You have unusual pain in the chest, abdomen, or back.   You are bleeding from the mouth or rectum, or you have black or tarry stool.   You have an irregular or very fast heartbeat.   You have repeated fainting or have seizure-like jerking during an episode.   You faint when sitting or lying down.   You have confusion.   You have difficulty walking.   You have severe weakness.   You have vision problems.  MAKE SURE YOU:   Understand these instructions.  Will  watch your condition.  Will get help right away if you are not doing well or get worse. Document Released: 04/01/2005 Document Revised: 04/06/2013 Document Reviewed: 09/04/2012 Exit[employer] Patient Information 2015 [employer]. This information is not intended to replace advice given to you by your health care provider. Make sure you discuss any questions you have with your health care provider.  

## 2014-07-27 NOTE — ED Provider Notes (Signed)
CSN: [id number]     Arrival date & time 07/27/14  1439 History   None    Chief Complaint  Patient presents with  . Loss of Consciousness     (Consider location/radiation/quality/duration/timing/severity/associated sxs/prior Treatment) Patient is a 74 y.o. male presenting with syncope. The history is provided by the spouse. History limited by: severe dementia. No language interpreter was used.  Loss of Consciousness Episode history:  Multiple Most recent episode:  Today Progression:  Resolved Chronicity:  Recurrent Context: normal activity   Witnessed: yes   Relieved by:  Lying down Worsened by:  Nothing tried Ineffective treatments:  None tried Associated symptoms: confusion (dementia)   Associated symptoms: no chest pain, no difficulty breathing, no fever, no recent fall, no recent injury, no shortness of breath and no vomiting   Risk factors: coronary artery disease     Past Medical History  Diagnosis Date  . Hypertension   . Dilated cardiomyopathy     2/12: EF 30-35%, trivial AI, mild RAE.  EF 2014 40 -45%  . CAD (coronary artery disease)     LHC 9/05 with Dr. Einar Gip:  dLM 20-30%, LAD 85%, oD1 20-30%.  PCI:  Taxus DES to LAD; Dx jailed and tx with POBA.  Last myoview 12/10: inf scar, no ischemia, EF 29%.  . DVT (deep venous thrombosis)   . Pulmonary embolus     chronic coumadin  . Hyperhomocystinemia   . PFO (patent foramen ovale)     Not mentioned on 2014 echo.  [name] HLD (hyperlipidemia)   . Crohn's disease   . Nephrolithiasis   . Diabetes mellitus     11/05/11 "borderline; don't take medications"  . Stroke 1993    "left arm can't hold steady; leg too"  . Arthritis     "used to have a touch in my legs"  . Memory difficulties   . Pneumonia ~ 2011    09-22-13 denies any recent SOB or breathing problems  . Swelling of both ankles      09-22-13 occ.feet, but denies pain.  . Bradycardia 06/01/2014  . Dementia    Past Surgical History  Procedure Laterality Date  .  Colon surgery  1994; 1996    "for Crohn's disease"  . Appendectomy    . Implantable cardioverter defibrillator implant N/A 06/02/2014    Procedure: IMPLANTABLE CARDIOVERTER DEFIBRILLATOR IMPLANT;  Surgeon: [name], MD;  Location: [facility] CATH LAB;  Service: Cardiovascular;  Laterality: N/A;   Family History  Problem Relation Age of Onset  . Diabetes type II Mother   . CAD Father   . Diabetes type II Brother    History  Substance Use Topics  . Smoking status: Current Some Day Smoker -- 0.50 packs/day for 53 years    Types: Cigarettes  . Smokeless tobacco: Never Used  . Alcohol Use: No    Review of Systems  Unable to perform ROS: Dementia  Constitutional: Negative for fever.  Respiratory: Negative for cough and shortness of breath.   Cardiovascular: Positive for syncope. Negative for chest pain.  Gastrointestinal: Negative for vomiting and abdominal pain.  Neurological: Positive for syncope. Negative for light-headedness.  Psychiatric/Behavioral: Positive for confusion (dementia).  All other systems reviewed and are negative.     Allergies  Review of patient's allergies indicates no known allergies.  Home Medications   Prior to Admission medications   Medication Sig Start Date End Date Taking? Authorizing Provider  acetaminophen (TYLENOL) 325 MG tablet Take 2 tablets (650 mg total) by  mouth every 6 (six) hours as needed for mild pain, moderate pain or fever. 11/23/13   Modena Jansky, MD  aspirin EC 81 MG EC tablet Take 1 tablet (81 mg total) by mouth daily. Patient not taking: Reported on 07/16/2014 04/18/14   Velta Addison Mikhail, DO  atorvastatin (LIPITOR) 10 MG tablet Take 1 tablet (10 mg total) by mouth daily at 6 PM. 04/18/14   Maryann Mikhail, DO  donepezil (ARICEPT) 5 MG tablet Take 5 mg by mouth at bedtime.    Historical Provider, MD  insulin aspart (NOVOLOG) 100 UNIT/ML injection CBG 70 - 120: 0 units CBG 121 - 150: 1 unit CBG 151 - 200: 2 units CBG 201 - 250: 3  units CBG 251 - 300: 5 units CBG 301 - 350: 7 units CBG 351 - 400: 9 units Patient not taking: Reported on 07/16/2014 06/04/14   Hosie Poisson, MD  latanoprost (XALATAN) 0.005 % ophthalmic solution Place 1 drop into both eyes at bedtime. 06/29/13   Historical Provider, MD  rivaroxaban (XARELTO) 20 MG TABS tablet Take 1 tablet (20 mg total) by mouth daily with supper. 06/23/14   Hosie Poisson, MD  sertraline (ZOLOFT) 50 MG tablet Take 50 mg by mouth at bedtime.  05/18/14   Historical Provider, MD  tamsulosin (FLOMAX) 0.4 MG CAPS capsule Take 1 capsule (0.4 mg total) by mouth daily. 04/18/14   Maryann Mikhail, DO   BP 104/63 mmHg  Pulse 66  Temp(Src) 97.7 F (36.5 C) (Oral)  Resp 16  SpO2 100%   Physical Exam  Constitutional: He appears well-developed and well-nourished. No distress.  Thin, elderly male  HENT:  Head: Normocephalic and atraumatic.  Nose: Nose normal.  Mouth/Throat: Oropharynx is clear and moist. No oropharyngeal exudate.  Eyes: EOM are normal. Pupils are equal, round, and reactive to light.  Neck: Normal range of motion. Neck supple.  Cardiovascular: Normal rate, regular rhythm, normal heart sounds and intact distal pulses.   No murmur heard. ICD at left upper chest, nontender  Pulmonary/Chest: Effort normal and breath sounds normal. No stridor. No respiratory distress. He has no wheezes. He exhibits no tenderness.  Abdominal: Soft. He exhibits no distension. There is no tenderness. There is no guarding.  Musculoskeletal: Normal range of motion. He exhibits no edema or tenderness.  Neurological: He is alert. No cranial nerve deficit. Coordination normal.  Oriented to person and to family members but otherwise unable to assist with history or perform higher level cognitive questions.   Moving all 4 extremities symmetrically.  Able to perform finger to nose test bilaterally.  Full sensation throughout   Skin: Skin is warm and dry. He is not diaphoretic. No pallor.  Psychiatric:  Cognition and memory are impaired.  Nursing note and vitals reviewed.   ED Course  Procedures (including critical care time) Labs Review Labs Reviewed  CBC WITH DIFFERENTIAL/PLATELET - Abnormal; Notable for the following:    RBC 3.31 (*)    Hemoglobin 9.6 (*)    HCT 30.6 (*)    Neutrophils Relative % 81 (*)    All other components within normal limits  BASIC METABOLIC PANEL - Abnormal; Notable for the following:    Glucose, Bld 107 (*)    GFR calc non Af Amer 53 (*)    GFR calc Af Amer 61 (*)    All other components within normal limits  CBG MONITORING, ED - Abnormal; Notable for the following:    Glucose-Capillary 112 (*)    All other components within  normal limits  Randolm Idol, ED    Imaging Review Dg Chest 2 View  07/27/2014   CLINICAL DATA:  Loss of consciousness.  Abnormal EKG Cough  EXAM: CHEST  2 VIEW  COMPARISON:  07/17/2014  FINDINGS: Left chest wall pacer device is noted with lead in the right atrial appendage and right ventricle. Heart size is normal. There is no pleural effusion or edema. Right midlung opacity appears decreased from previous exam. This measures 14 mm, previously 24 mm. No new findings identified.  IMPRESSION: Decrease in size of previous noted right midlung opacity.   Electronically Signed   By: Kerby Moors M.D.   On: 07/27/2014 16:31     EKG Interpretation   Date/Time:  Wednesday July 27 2014 14:39:52 EDT Ventricular Rate:  69 PR Interval:  174 QRS Duration: 83 QT Interval:  414 QTC Calculation: 443 R Axis:   75 Text Interpretation:  Atrial-paced complexes Left ventricular hypertrophy  Nonspecific T abnormalities, lateral leads ST elevation, consider anterior  injury no significant change since July 15 2014 Confirmed by Regenia Skeeter  MD,  SCOTT [phone]) on 07/27/2014 3:01:35 PM      MDM   Final diagnoses:  Cardiac syncope    Patient is a 74 yo M with hx of dementia, DM, CVA, A fib (on xarelto), CHF (EF 30% in 2/16), bradycardia  with cardioverter/defibrillatory placed in Feb, CAD, and recent PE (8/15), who presents after an episode of syncope.  Had a stroke in Feb, then went to rehab facility for 1 month, and has been home for the past few weeks.  Wife reports 2 episodes of syncope since he has been home.  Previous episode was 4/1, he was found to be hypotensive and hypothermic at that time, with + lactic acidosis.  Syncope thought to be neurocardiogenic in nature.  He was admitted from 4/1 to 4/5 then discharged home with physical therapy and cardiac follow up.   This morning he worked out with PT at home, then was placed in a chair.  Wife saw him sit down, then slump over and was not responsive for approximately 2 minutes.  She helped him out of the chair and placed him on the ground.  He then regained consciousness but was still somewhat "dazed" when EMS arrived.  Wife denies seeing him shake, or any abnormal movements that could indicate that he was getting shocked by his ICD.  He was breathing the whole time and his eyes were open but "staring off into space" per wife.  Glucose 200+ and normotensive. Brought to [facility] ED for eval.  Denies current sx, but has severe dementia so unclear how reliable he is.  Normal vitals.  Oriented to person and to family, but otherwise not able to help with history.  Exam at baseline per wife.  Nonfocal neuro exam.    No history of falls recently (since his CT head on 4/1 that was atraumatic), so will hold on any additional imaging.  Is one week out of a very extensive cardiac work up for a similar episode.  Will work up with labs, EKG, and CXR.  Will interrogate his medtronic pacer.  Labs, EKG, and CXR returned benign.  Pacer had no episodes of discharging since the last check on 4/2, battery is good, and lead measurements are appropriate.  Patient remained stable while in the ED, with normal vitals throughout.   No acute emergent cause of his syncope at this time.  Based on PMH, his  recurrent episodes are  likely neurocardiogenic.   He has a Investment banker, corporate in place and wife is well educated on the way to treat him during these recurrent syncopal episodes.  Feel that he is safe for discharge home.  Wife ok to dc as well.   All questions were answered and ED return precautions were discussed.     Patient was seen with ED Attending, Dr. Brynda Greathouse, MD   Tori Milks, MD 07/28/14 Amanda, MD 07/30/14 2013

## 2014-08-04 ENCOUNTER — Telehealth: Payer: Self-pay | Admitting: Internal Medicine

## 2014-08-04 NOTE — Telephone Encounter (Signed)
New Message       Hospice calling stating that pt would like for his Defib to be turned off. Please call back and advise.

## 2014-08-04 NOTE — Telephone Encounter (Signed)
Forwarding to Dr. Caryl Comes to address

## 2014-08-08 NOTE — Telephone Encounter (Signed)
Can we arrnage this   Have someone call hospice to confirm and then send industry out to turn it off place

## 2014-08-09 DIAGNOSIS — I5042 Chronic combined systolic (congestive) and diastolic (congestive) heart failure: Secondary | ICD-10-CM | POA: Insufficient documentation

## 2014-08-09 DIAGNOSIS — R55 Syncope and collapse: Secondary | ICD-10-CM | POA: Insufficient documentation

## 2014-08-09 DIAGNOSIS — I9589 Other hypotension: Secondary | ICD-10-CM | POA: Insufficient documentation

## 2014-08-09 NOTE — Telephone Encounter (Signed)
Spoke w/Donna from Hospice. Name/number given to [employer] with MDT to correlate day/time to schedule for ICD to be turned off.

## 2014-08-09 NOTE — Telephone Encounter (Signed)
Forwarding to Farmer in device clinic to address/arrange.

## 2014-08-18 ENCOUNTER — Ambulatory Visit: Payer: Medicare Other | Admitting: Cardiology

## 2014-08-19 ENCOUNTER — Telehealth: Payer: Self-pay | Admitting: *Deleted

## 2014-08-19 NOTE — Telephone Encounter (Signed)
Acknowledgement of change of treatment signed and return faxed

## 2014-09-01 ENCOUNTER — Encounter: Payer: Self-pay | Admitting: Internal Medicine

## 2014-09-01 ENCOUNTER — Non-Acute Institutional Stay (SKILLED_NURSING_FACILITY): Payer: Medicare Other | Admitting: Internal Medicine

## 2014-09-01 DIAGNOSIS — I6990 Unspecified sequelae of unspecified cerebrovascular disease: Secondary | ICD-10-CM | POA: Diagnosis not present

## 2014-09-01 DIAGNOSIS — E1122 Type 2 diabetes mellitus with diabetic chronic kidney disease: Secondary | ICD-10-CM | POA: Diagnosis not present

## 2014-09-01 DIAGNOSIS — K50919 Crohn's disease, unspecified, with unspecified complications: Secondary | ICD-10-CM | POA: Diagnosis not present

## 2014-09-01 DIAGNOSIS — N189 Chronic kidney disease, unspecified: Secondary | ICD-10-CM | POA: Diagnosis not present

## 2014-09-01 DIAGNOSIS — R531 Weakness: Secondary | ICD-10-CM | POA: Diagnosis not present

## 2014-09-01 DIAGNOSIS — E785 Hyperlipidemia, unspecified: Secondary | ICD-10-CM

## 2014-09-01 DIAGNOSIS — I2699 Other pulmonary embolism without acute cor pulmonale: Secondary | ICD-10-CM

## 2014-09-01 DIAGNOSIS — F0150 Vascular dementia without behavioral disturbance: Secondary | ICD-10-CM

## 2014-09-01 DIAGNOSIS — F329 Major depressive disorder, single episode, unspecified: Secondary | ICD-10-CM | POA: Diagnosis not present

## 2014-09-01 DIAGNOSIS — I2510 Atherosclerotic heart disease of native coronary artery without angina pectoris: Secondary | ICD-10-CM

## 2014-09-01 DIAGNOSIS — I5022 Chronic systolic (congestive) heart failure: Secondary | ICD-10-CM | POA: Diagnosis not present

## 2014-09-01 DIAGNOSIS — I10 Essential (primary) hypertension: Secondary | ICD-10-CM

## 2014-09-01 DIAGNOSIS — F0280 Dementia in other diseases classified elsewhere without behavioral disturbance: Secondary | ICD-10-CM

## 2014-09-01 DIAGNOSIS — F0393 Unspecified dementia, unspecified severity, with mood disturbance: Secondary | ICD-10-CM

## 2014-09-01 NOTE — Progress Notes (Signed)
Patient ID: Grant Lara, male   DOB: [birth year], 74 y.o.   MRN: [id number]    HISTORY AND PHYSICAL   DATE: 09/01/14  Location:  [facility]    Place of Service: SNF [phone])   Extended Emergency Contact Information Primary Emergency Contact: Rudy,[name] Address: [address]          [place], [place] [address] [name] of [place] Phone: [phone] Mobile Phone: [phone] Relation: Spouse Secondary Emergency Contact: [name],[name]          [place], [place] of [place] Mobile Phone: [phone] Relation: Daughter  Advanced Directive information  DNR; Hospice pt  Chief Complaint  Patient presents with  . New Admit To SNF    HPI:  74 yo male seen today as a new admission into [facility] for respite care. He has no c/o. No nursing issues. He is a poor historian due to dementia. 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 He takes xeralto/ASA for anticoagulation.   He has depression and takes zoloft.  Dementia stable on aricept  He has DM but was not using SSI at home. No low BS reactions. Last A1c 6.5% in Feb 2016  He does not take any meds for chronic systolic HF. EF 20-25% with diffuse hypokinesis in Feb 2016 and he underwent dual chamber ICD.  He is taking flomax but no associated dx in chart   Wife is primary care giver and he lives at home with her. Hospice to follow  Past Medical History  Diagnosis Date  . Hypertension   . Dilated cardiomyopathy     2/12: EF 30-35%, trivial AI, mild RAE.  EF 2014 40 -45%  . CAD (coronary artery disease)     LHC 9/05 with Dr. Einar Gip:  dLM 20-30%, LAD 85%, oD1 20-30%.  PCI:  Taxus DES to LAD; Dx jailed and tx with POBA.  Last myoview 12/10: inf scar, no ischemia, EF 29%.  . DVT (deep venous thrombosis)   . Pulmonary embolus     chronic coumadin  . Hyperhomocystinemia   . PFO (patent foramen ovale)     Not mentioned on 2014 echo.  [name] HLD (hyperlipidemia)   . Crohn's  disease   . Nephrolithiasis   . Diabetes mellitus     11/05/11 "borderline; don't take medications"  . Stroke 1993    "left arm can't hold steady; leg too"  . Arthritis     "used to have a touch in my legs"  . Memory difficulties   . Pneumonia ~ 2011    09-22-13 denies any recent SOB or breathing problems  . Swelling of both ankles      09-22-13 occ.feet, but denies pain.  . Bradycardia 06/01/2014  . Dementia     Past Surgical History  Procedure Laterality Date  . Colon surgery  1994; 1996    "for Crohn's disease"  . Appendectomy    . Implantable cardioverter defibrillator implant N/A 06/02/2014    Procedure: IMPLANTABLE CARDIOVERTER DEFIBRILLATOR IMPLANT;  Surgeon: [name], MD;  Location: [facility] CATH LAB;  Service: Cardiovascular;  Laterality: N/A;    Patient Care Team: Maury Dus, MD as PCP - General (Family Medicine)  History   Social History  . Marital Status: Married    Spouse Name: N/A  . Number of Children: N/A  . Years of Education: N/A   Occupational History  . Not on file.   Social History Main  Topics  . Smoking status: Current Some Day Smoker -- 0.50 packs/day for 53 years    Types: Cigarettes  . Smokeless tobacco: Never Used  . Alcohol Use: No  . Drug Use: No  . Sexual Activity: Not Currently   Other Topics Concern  . Not on file   Social History Narrative     reports that he has been smoking Cigarettes.  He has a 26.5 pack-year smoking history. He has never used smokeless tobacco. He reports that he does not drink alcohol or use illicit drugs.  Family History  Problem Relation Age of Onset  . Diabetes type II Mother   . CAD Father   . Diabetes type II Brother    Family Status  Relation Status Death Age  . Mother Deceased   . Father Deceased     Immunization History  Administered Date(s) Administered  . Influenza Whole 02/09/2010  . PPD Test 11/05/2011  . Pneumococcal Polysaccharide-23 02/19/2010    No Known  Allergies  Medications: Patient's Medications  New Prescriptions   No medications on file  Previous Medications   ACETAMINOPHEN (TYLENOL) 325 MG TABLET    Take 2 tablets (650 mg total) by mouth every 6 (six) hours as needed for mild pain, moderate pain or fever.   ASPIRIN EC 81 MG EC TABLET    Take 1 tablet (81 mg total) by mouth daily.   ATORVASTATIN (LIPITOR) 10 MG TABLET    Take 1 tablet (10 mg total) by mouth daily at 6 PM.   DONEPEZIL (ARICEPT) 5 MG TABLET    Take 5 mg by mouth at bedtime.   INSULIN ASPART (NOVOLOG) 100 UNIT/ML INJECTION    CBG 70 - 120: 0 units CBG 121 - 150: 1 unit CBG 151 - 200: 2 units CBG 201 - 250: 3 units CBG 251 - 300: 5 units CBG 301 - 350: 7 units CBG 351 - 400: 9 units   LATANOPROST (XALATAN) 0.005 % OPHTHALMIC SOLUTION    Place 1 drop into both eyes at bedtime.   RIVAROXABAN (XARELTO) 20 MG TABS TABLET    Take 1 tablet (20 mg total) by mouth daily with supper.   SERTRALINE (ZOLOFT) 50 MG TABLET    Take 50 mg by mouth at bedtime.    TAMSULOSIN (FLOMAX) 0.4 MG CAPS CAPSULE    Take 1 capsule (0.4 mg total) by mouth daily.  Modified Medications   No medications on file  Discontinued Medications   No medications on file    Review of Systems  Unable to perform ROS: Dementia    Filed Vitals:   09/01/14 1402  BP: 122/80  Pulse: 72  Temp: 98 F (36.7 C)  SpO2: 98%   There is no weight on file to calculate BMI.  Physical Exam  Constitutional: He appears well-developed and well-nourished. No distress.  HENT:  Mouth/Throat: Oropharynx is clear and moist.  Eyes: Pupils are equal, round, and reactive to light. No scleral icterus.  Neck: Neck supple. No thyromegaly present.  Cardiovascular: Normal rate, regular rhythm, normal heart sounds and intact distal pulses.  Exam reveals no gallop and no friction rub.   No murmur heard. No carotid bruit b/l; no distal LE swelling  Pulmonary/Chest: Effort normal and breath sounds normal. He has no wheezes.  He has no rales. He exhibits no tenderness.  Abdominal: Soft. Bowel sounds are normal. He exhibits no distension, no abdominal bruit, no pulsatile midline mass and no mass. There is no tenderness. There is no  rebound and no guarding.  Lymphadenopathy:    He has no cervical adenopathy.  Neurological: He is alert. He has normal reflexes.  Skin: Skin is warm and dry. No rash noted.  Psychiatric: He has a normal mood and affect. His behavior is normal.     Labs reviewed: Admission on 07/27/2014, Discharged on 07/27/2014  Component Date Value Ref Range Status  . WBC 07/27/2014 7.8  4.0 - 10.5 K/uL Final  . RBC 07/27/2014 3.31* 4.22 - 5.81 MIL/uL Final  . Hemoglobin 07/27/2014 9.6* 13.0 - 17.0 g/dL Final  . HCT 07/27/2014 30.6* 39.0 - 52.0 % Final  . MCV 07/27/2014 92.4  78.0 - 100.0 fL Final  . MCH 07/27/2014 29.0  26.0 - 34.0 pg Final  . MCHC 07/27/2014 31.4  30.0 - 36.0 g/dL Final  . RDW 07/27/2014 13.8  11.5 - 15.5 % Final  . Platelets 07/27/2014 210  150 - 400 K/uL Final  . Neutrophils Relative % 07/27/2014 81* 43 - 77 % Final  . Neutro Abs 07/27/2014 6.3  1.7 - 7.7 K/uL Final  . Lymphocytes Relative 07/27/2014 12  12 - 46 % Final  . Lymphs Abs 07/27/2014 0.9  0.7 - 4.0 K/uL Final  . Monocytes Relative 07/27/2014 7  3 - 12 % Final  . Monocytes Absolute 07/27/2014 0.5  0.1 - 1.0 K/uL Final  . Eosinophils Relative 07/27/2014 1  0 - 5 % Final  . Eosinophils Absolute 07/27/2014 0.0  0.0 - 0.7 K/uL Final  . Basophils Relative 07/27/2014 1  0 - 1 % Final  . Basophils Absolute 07/27/2014 0.0  0.0 - 0.1 K/uL Final  . Sodium 07/27/2014 140  135 - 145 mmol/L Final  . Potassium 07/27/2014 3.9  3.5 - 5.1 mmol/L Final  . Chloride 07/27/2014 105  96 - 112 mmol/L Final  . CO2 07/27/2014 27  19 - 32 mmol/L Final  . Glucose, Bld 07/27/2014 107* 70 - 99 mg/dL Final  . BUN 07/27/2014 16  6 - 23 mg/dL Final  . Creatinine, Ser 07/27/2014 1.28  0.50 - 1.35 mg/dL Final  . Calcium 07/27/2014 8.9  8.4 -  10.5 mg/dL Final  . GFR calc non Af Amer 07/27/2014 53* >90 mL/min Final  . GFR calc Af Amer 07/27/2014 61* >90 mL/min Final   Comment: (NOTE) The eGFR has been calculated using the CKD EPI equation. This calculation has not been validated in all clinical situations. eGFR's persistently <90 mL/min signify possible Chronic Kidney Disease.   . Anion gap 07/27/2014 8  5 - 15 Final  . Glucose-Capillary 07/27/2014 112* 70 - 99 mg/dL Final  . Troponin i, poc 07/27/2014 0.00  0.00 - 0.08 ng/mL Final  . Comment 3 07/27/2014          Final   Comment: Due to the release kinetics of cTnI, a negative result within the first hours of the onset of symptoms does not rule out myocardial infarction with certainty. If myocardial infarction is still suspected, repeat the test at appropriate intervals.   Admission on 07/15/2014, Discharged on 07/19/2014  Component Date Value Ref Range Status  . Troponin I 07/15/2014 <0.03  <0.031 ng/mL Final   Comment:        NO INDICATION OF MYOCARDIAL INJURY.   . WBC 07/15/2014 7.9  4.0 - 10.5 K/uL Final  . RBC 07/15/2014 2.76* 4.22 - 5.81 MIL/uL Final  . Hemoglobin 07/15/2014 8.4* 13.0 - 17.0 g/dL Final  . HCT 07/15/2014 26.1* 39.0 - 52.0 %  Final  . MCV 07/15/2014 94.6  78.0 - 100.0 fL Final  . MCH 07/15/2014 30.4  26.0 - 34.0 pg Final  . MCHC 07/15/2014 32.2  30.0 - 36.0 g/dL Final  . RDW 07/15/2014 14.8  11.5 - 15.5 % Final  . Platelets 07/15/2014 195  150 - 400 K/uL Final  . Neutrophils Relative % 07/15/2014 83* 43 - 77 % Final  . Neutro Abs 07/15/2014 6.5  1.7 - 7.7 K/uL Final  . Lymphocytes Relative 07/15/2014 11* 12 - 46 % Final  . Lymphs Abs 07/15/2014 0.9  0.7 - 4.0 K/uL Final  . Monocytes Relative 07/15/2014 5  3 - 12 % Final  . Monocytes Absolute 07/15/2014 0.4  0.1 - 1.0 K/uL Final  . Eosinophils Relative 07/15/2014 1  0 - 5 % Final  . Eosinophils Absolute 07/15/2014 0.1  0.0 - 0.7 K/uL Final  . Basophils Relative 07/15/2014 0  0 - 1 % Final  .  Basophils Absolute 07/15/2014 0.0  0.0 - 0.1 K/uL Final  . Sodium 07/15/2014 138  135 - 145 mmol/L Final  . Potassium 07/15/2014 4.4  3.5 - 5.1 mmol/L Final  . Chloride 07/15/2014 109  96 - 112 mmol/L Final  . CO2 07/15/2014 26  19 - 32 mmol/L Final  . Glucose, Bld 07/15/2014 279* 70 - 99 mg/dL Final  . BUN 07/15/2014 20  6 - 23 mg/dL Final  . Creatinine, Ser 07/15/2014 1.50* 0.50 - 1.35 mg/dL Final  . Calcium 07/15/2014 8.5  8.4 - 10.5 mg/dL Final  . Total Protein 07/15/2014 5.3* 6.0 - 8.3 g/dL Final  . Albumin 07/15/2014 2.6* 3.5 - 5.2 g/dL Final  . AST 07/15/2014 28  0 - 37 U/L Final  . ALT 07/15/2014 51  0 - 53 U/L Final  . Alkaline Phosphatase 07/15/2014 92  39 - 117 U/L Final  . Total Bilirubin 07/15/2014 0.4  0.3 - 1.2 mg/dL Final  . GFR calc non Af Amer 07/15/2014 44* >90 mL/min Final  . GFR calc Af Amer 07/15/2014 51* >90 mL/min Final   Comment: (NOTE) The eGFR has been calculated using the CKD EPI equation. This calculation has not been validated in all clinical situations. eGFR's persistently <90 mL/min signify possible Chronic Kidney Disease.   . Anion gap 07/15/2014 3* 5 - 15 Final  . Color, Urine 07/16/2014 AMBER* YELLOW Final   BIOCHEMICALS MAY BE AFFECTED BY COLOR  . APPearance 07/16/2014 CLOUDY* CLEAR Final  . Specific Gravity, Urine 07/16/2014 1.031* 1.005 - 1.030 Final  . pH 07/16/2014 5.0  5.0 - 8.0 Final  . Glucose, UA 07/16/2014 250* NEGATIVE mg/dL Final  . Hgb urine dipstick 07/16/2014 MODERATE* NEGATIVE Final  . Bilirubin Urine 07/16/2014 SMALL* NEGATIVE Final  . Ketones, ur 07/16/2014 15* NEGATIVE mg/dL Final  . Protein, ur 07/16/2014 30* NEGATIVE mg/dL Final  . Urobilinogen, UA 07/16/2014 0.2  0.0 - 1.0 mg/dL Final  . Nitrite 07/16/2014 NEGATIVE  NEGATIVE Final  . Leukocytes, UA 07/16/2014 NEGATIVE  NEGATIVE Final  . Lactic Acid, Venous 07/15/2014 3.10* 0.5 - 2.0 mmol/L Final  . Comment 07/15/2014 NOTIFIED PHYSICIAN   Final  . Specimen Description  07/15/2014 BLOOD RIGHT ARM   Final  . Special Requests 07/15/2014 BOTTLES DRAWN AEROBIC ONLY 2CC   Final  . Culture 07/15/2014    Final                   Value:NO GROWTH 5 DAYS Performed at [facility]   . Report  Status 07/15/2014 07/22/2014 FINAL   Final  . Specimen Description 07/16/2014 BLOOD RIGHT FOREARM   Final  . Special Requests 07/16/2014 BOTTLES DRAWN AEROBIC AND ANAEROBIC 5CC   Final  . Culture 07/16/2014    Final                   Value:NO GROWTH 5 DAYS Performed at [facility]   . Report Status 07/16/2014 07/22/2014 FINAL   Final  . Specimen Description 07/16/2014 URINE, RANDOM   Final  . Special Requests 07/16/2014 NONE   Final  . Colony Count 07/16/2014    Final                   Value:NO GROWTH Performed at [facility]   . Culture 07/16/2014    Final                   Value:NO GROWTH Performed at [facility]   . Report Status 07/16/2014 07/17/2014 FINAL   Final  . Squamous Epithelial / LPF 07/16/2014 RARE  RARE Final  . WBC, UA 07/16/2014 3-6  <3 WBC/hpf Final  . RBC / HPF 07/16/2014 21-50  <3 RBC/hpf Final  . Casts 07/16/2014 HYALINE CASTS* NEGATIVE Final  . Urine-Other 07/16/2014 MUCOUS PRESENT   Final  . Lactic Acid, Venous 07/16/2014 2.04* 0.5 - 2.0 mmol/L Final  . Comment 07/16/2014 NOTIFIED PHYSICIAN   Final  . MRSA by PCR 07/16/2014 NEGATIVE  NEGATIVE Final   Comment:        The GeneXpert MRSA Assay (FDA approved for NASAL specimens only), is one component of a comprehensive MRSA colonization surveillance program. It is not intended to diagnose MRSA infection nor to guide or monitor treatment for MRSA infections.   . Troponin I 07/16/2014 <0.03  <0.031 ng/mL Final   Comment:        NO INDICATION OF MYOCARDIAL INJURY.   . Troponin I 07/16/2014 <0.03  <0.031 ng/mL Final   Comment:        NO INDICATION OF MYOCARDIAL INJURY.   . Sodium 07/16/2014 140  135 - 145 mmol/L Final  . Potassium 07/16/2014 4.0  3.5 -  5.1 mmol/L Final  . Chloride 07/16/2014 113* 96 - 112 mmol/L Final  . CO2 07/16/2014 28  19 - 32 mmol/L Final  . Glucose, Bld 07/16/2014 171* 70 - 99 mg/dL Final  . BUN 07/16/2014 19  6 - 23 mg/dL Final  . Creatinine, Ser 07/16/2014 1.36* 0.50 - 1.35 mg/dL Final  . Calcium 07/16/2014 8.5  8.4 - 10.5 mg/dL Final  . GFR calc non Af Amer 07/16/2014 49* >90 mL/min Final  . GFR calc Af Amer 07/16/2014 57* >90 mL/min Final   Comment: (NOTE) The eGFR has been calculated using the CKD EPI equation. This calculation has not been validated in all clinical situations. eGFR's persistently <90 mL/min signify possible Chronic Kidney Disease.   . Anion gap 07/16/2014 0.0* 5 - 15 Final   REPEATED TO VERIFY  . WBC 07/16/2014 7.4  4.0 - 10.5 K/uL Final  . RBC 07/16/2014 2.64* 4.22 - 5.81 MIL/uL Final  . Hemoglobin 07/16/2014 8.0* 13.0 - 17.0 g/dL Final  . HCT 07/16/2014 25.2* 39.0 - 52.0 % Final  . MCV 07/16/2014 95.5  78.0 - 100.0 fL Final  . MCH 07/16/2014 30.3  26.0 - 34.0 pg Final  . MCHC 07/16/2014 31.7  30.0 - 36.0 g/dL Final  . RDW 07/16/2014 14.9  11.5 - 15.5 %  Final  . Platelets 07/16/2014 179  150 - 400 K/uL Final  . Fecal Occult Bld 07/16/2014 NEGATIVE  NEGATIVE Final  . Prothrombin Time 07/16/2014 16.9* 11.6 - 15.2 seconds Final  . INR 07/16/2014 1.36  0.00 - 1.49 Final  . Glucose-Capillary 07/16/2014 64* 70 - 99 mg/dL Final  . Glucose-Capillary 07/16/2014 121* 70 - 99 mg/dL Final  . Glucose-Capillary 07/16/2014 87  70 - 99 mg/dL Final  . Comment 1 07/16/2014 Capillary Specimen   Final  . Glucose-Capillary 07/16/2014 113* 70 - 99 mg/dL Final  . Glucose-Capillary 07/16/2014 138* 70 - 99 mg/dL Final  . Comment 1 07/16/2014 Capillary Specimen   Final  . Glucose-Capillary 07/17/2014 161* 70 - 99 mg/dL Final  . Glucose-Capillary 07/17/2014 81  70 - 99 mg/dL Final  . Glucose-Capillary 07/17/2014 170* 70 - 99 mg/dL Final  . WBC 07/18/2014 5.3  4.0 - 10.5 K/uL Final  . RBC 07/18/2014 2.84*  4.22 - 5.81 MIL/uL Final  . Hemoglobin 07/18/2014 8.5* 13.0 - 17.0 g/dL Final  . HCT 07/18/2014 26.4* 39.0 - 52.0 % Final  . MCV 07/18/2014 93.0  78.0 - 100.0 fL Final  . MCH 07/18/2014 29.9  26.0 - 34.0 pg Final  . MCHC 07/18/2014 32.2  30.0 - 36.0 g/dL Final  . RDW 07/18/2014 14.6  11.5 - 15.5 % Final  . Platelets 07/18/2014 196  150 - 400 K/uL Final  . Neutrophils Relative % 07/18/2014 51  43 - 77 % Final  . Neutro Abs 07/18/2014 2.7  1.7 - 7.7 K/uL Final  . Lymphocytes Relative 07/18/2014 37  12 - 46 % Final  . Lymphs Abs 07/18/2014 2.0  0.7 - 4.0 K/uL Final  . Monocytes Relative 07/18/2014 8  3 - 12 % Final  . Monocytes Absolute 07/18/2014 0.4  0.1 - 1.0 K/uL Final  . Eosinophils Relative 07/18/2014 3  0 - 5 % Final  . Eosinophils Absolute 07/18/2014 0.2  0.0 - 0.7 K/uL Final  . Basophils Relative 07/18/2014 1  0 - 1 % Final  . Basophils Absolute 07/18/2014 0.0  0.0 - 0.1 K/uL Final  . Sodium 07/18/2014 140  135 - 145 mmol/L Final  . Potassium 07/18/2014 4.0  3.5 - 5.1 mmol/L Final  . Chloride 07/18/2014 109  96 - 112 mmol/L Final  . CO2 07/18/2014 26  19 - 32 mmol/L Final  . Glucose, Bld 07/18/2014 82  70 - 99 mg/dL Final  . BUN 07/18/2014 12  6 - 23 mg/dL Final  . Creatinine, Ser 07/18/2014 1.09  0.50 - 1.35 mg/dL Final  . Calcium 07/18/2014 8.7  8.4 - 10.5 mg/dL Final  . GFR calc non Af Amer 07/18/2014 64* >90 mL/min Final  . GFR calc Af Amer 07/18/2014 75* >90 mL/min Final   Comment: (NOTE) The eGFR has been calculated using the CKD EPI equation. This calculation has not been validated in all clinical situations. eGFR's persistently <90 mL/min signify possible Chronic Kidney Disease.   . Anion gap 07/18/2014 5  5 - 15 Final  . Glucose-Capillary 07/17/2014 83  70 - 99 mg/dL Final  . Glucose-Capillary 07/17/2014 98  70 - 99 mg/dL Final  . Glucose-Capillary 07/18/2014 66* 70 - 99 mg/dL Final  . Glucose-Capillary 07/18/2014 140* 70 - 99 mg/dL Final  . Glucose-Capillary  07/18/2014 93  70 - 99 mg/dL Final  . Glucose-Capillary 07/18/2014 102* 70 - 99 mg/dL Final  . Glucose-Capillary 07/19/2014 77  70 - 99 mg/dL Final  . Edmonia Lynch 07/19/2014  138* 70 - 99 mg/dL Final  Clinical Support on 06/23/2014  Component Date Value Ref Range Status  . Date Time Interrogation Session 06/23/2014 [id number]   Final  . Implantable Pulse Generator Manufa* 06/23/2014 Medtronic   Final  . Implantable Pulse Generator Model 06/23/2014 [id number] Evera MRI XT DR   Final  . Implantable Pulse Generator Serial* 06/23/2014 [id number]   Final  . Lead Channel Setting Sensing Sensi* 06/23/2014 0.3   Final  . Lead Channel Setting Pacing Amplit* 06/23/2014 3.5   Final  . Lead Channel Setting Pacing Pulse * 06/23/2014 0.4   Final  . Lead Channel Setting Pacing Amplit* 06/23/2014 3.5   Final  . Zone Setting Type Category 06/23/2014 VF   Final  . Zone Setting Detection Interval 06/23/2014 250   Final  . Zone Setting Type Category 06/23/2014 VT   Final  . Zone Setting Detection Interval 06/23/2014 Blank   Final  . Zone Setting Type Category 06/23/2014 VENTRICULAR_TACHYCARDIA_1   Final  . Zone Setting Detection Interval 06/23/2014 300   Final  . Zone Setting Type Category 06/23/2014 VENTRICULAR_TACHYCARDIA_2   Final  . Zone Setting Detection Interval 06/23/2014 370   Final  . Zone Setting Type Category 06/23/2014 ATRIAL_FIBRILLATION   Final  . Zone Setting Type Category 06/23/2014 ATAF   Final  . Zone Setting Detection Interval 06/23/2014 350   Final  . Lead Channel Impedance Value 06/23/2014 456   Final  . Lead Channel Sensing Intrinsic Amp* 06/23/2014 7   Final  . Lead Channel Sensing Intrinsic Amp* 06/23/2014 6.125   Final  . Lead Channel Pacing Threshold Ampl* 06/23/2014 0.5   Final  . Lead Channel Pacing Threshold Puls* 06/23/2014 0.4   Final  . Lead Channel Impedance Value 06/23/2014 399   Final  . Lead Channel Sensing Intrinsic Amp* 06/23/2014 23.125   Final  . Lead Channel  Sensing Intrinsic Amp* 06/23/2014 20   Final  . Lead Channel Pacing Threshold Ampl* 06/23/2014 0.5   Final  . Lead Channel Pacing Threshold Puls* 06/23/2014 0.4   Final  . HighPow Impedance 06/23/2014 190   Final  . HighPow Impedance 06/23/2014 71   Final  . Battery Status 06/23/2014 OK   Final  . Battery Remaining Longevity 06/23/2014 130   Final  . Battery Voltage 06/23/2014 3.14   Final  . Loletha Grayer Statistic RA Percent Paced 06/23/2014 21.49   Final  . Loletha Grayer Statistic RV Percent Paced 06/23/2014 0.07   Final  . Loletha Grayer Statistic AP VP Percent 06/23/2014 0.02   Final  . Loletha Grayer Statistic AS VP Percent 06/23/2014 0.04   Final  . Loletha Grayer Statistic AP VS Percent 06/23/2014 21.46   Final  . Loletha Grayer Statistic AS VS Percent 06/23/2014 78.47   Final  . Eval Rhythm 06/23/2014 SR@60    Final  . Miscellaneous Comment 06/23/2014    Final                   Value:Wound check appointment. Dermabond removed. Wound without redness or edema. Incision edges approximated, wound well healed. Normal device function. Thresholds, sensing, and impedances consistent with implant measurements. Device programmed at 3.5V for  extra safety margin until 3 month visit. Histogram distribution appropriate for patient and level of activity. No mode switches or ventricular arrhythmias noted. Patient educated about wound care, arm mobility, lifting restrictions, shock plan. ROV in 3  months with implanting physician.   Admission on 05/30/2014, Discharged on 06/04/2014  No results displayed because visit has over  200 results.      No results found.   Assessment/Plan   ICD-9-CM ICD-10-CM   1. Vascular dementia, without behavioral disturbance - cont aricept 290.40 F01.50   2. Type 2 diabetes mellitus with diabetic chronic kidney disease - cont SSI 250.40 E11.22    585.9 N18.9   3. Late effects of CVA (cerebrovascular accident) - cont ASA 438.9 I69.90   4. Essential hypertension, benign - stable 401.1 I10   5. Hyperlipidemia -  cont statin 272.4 E78.5   6. Chronic systolic CHF (congestive heart failure) s/p dual chamber ICD -  428.22 I50.22    428.0    7. Crohn's disease, unspecified complication - asymptomatic at this time 555.9 K50.919   8. Coronary artery disease involving native coronary artery of native heart without angina pectoris - cont ASA 414.01 I25.10   9. Generalized weakness 780.79 R53.1   10. 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 Communicated with pt and nursing.   Daimian Sudberry S. Perlie Gold  [facility] [address] [place], [place] [address] [phone] Cell (Monday-Friday 8 AM - 5 PM) [phone] After 5 PM and follow prompts

## 2014-09-02 NOTE — Discharge Summary (Signed)
Physician Discharge Summary  Grant Lara OMV:[id number] DOB: 11-Feb-1941 DOA: 07/15/2014  PCP: [name], MD  Admit date: 07/15/2014 Discharge date: 09/02/2014  Time spent: 45 minutes  Recommendations for Outpatient Follow-up:  1. Recommend c regular sodium amount in diet and at least 2 liters fluid per day 2. recommend consideration midodrine/FLorinef as OP.  Continue TED hose  3. Rec OP Cardiology f/o for PPM checks and further management-CC Cardiologist with this d/c note   Discharge Diagnoses:  Principal Problem:   Syncope Active Problems:   Coronary atherosclerosis   Chronic systolic heart failure   Crohn's disease   DM2 (diabetes mellitus, type 2)   Coronary artery disease involving native coronary artery of native heart without angina pectoris   Hypotension   Hypothermia   SIRS (systemic inflammatory response syndrome)   Pulmonary embolus   Late effects of CVA (cerebrovascular accident)   Discharge Condition: good  Diet recommendation: high salt, 2 litrs fluid/24 hrs  Filed Weights   07/16/14 0330  Weight: 53.3 kg (117 lb 8.1 oz)    History of pre73 y/o ? Hyperhomocysteinemia, multiple Embolic [first in 6283?] CVA 04/15/14 + prior DVT + bilate PE  4.11.2007 and CVA at that time/ new acute PE 8/29015 on Xarelto 11/2013, CAD s/p stent LAD,+ resultant Systolic HF EF 66-29% Jan 2016.  Had PPM placed 05/2014 for 2/2 prevention VT + Neurocardiogenic syncope DM ty 2. CKD III, Chr Sys HF + EF 25-30% Note that n a prior Echo-PFO was noted + intrasepetal anuersym   Antecedent Memory issues prior to CVA's as per chart on Aricept COPD SBO 07/2013 Documented Upper lung lesion 10/2011 Wife on phone tells me he was walking towards RR and he stumbled-Before he fell tot he floor was caught by wife.  Seems "out of it"  Got clammy and sweaty--wouldn'[t follow command.  Couldn't understand what he was saying.  He was out of it for ~ 2-3 min--Wouldn't sit up by himself. Paramedics  put IV iron-Smile per wife was altered Speech was slurred-this lasted ~ 10-15 min  Present illness:   1. Syncope-Cause-neurocardiogenic -patient also has dropped hemoglobin--I've hemocculted stool and this is negative-suspect hemodilution. PPM interrogation 4/2 wnl.  Appreciate cardiology. patient's last presentation to the hospital in 05/2014 was remarkably similar to this one wherein patient was hypothermic+ LOC-unlikely infectious at this stage other than one vital sign i.e. Hypothermia. Addition of alpha-2 antagonist as well as anticholinergic medications could also cause reasons for syncope.  CT head 4/2 no acute CVA.  Cardiology saw patient and felt this is neuro-cardiogenic syncope.  TED hose thigh high, [facility]ost[facility].  Will consider florinef/midodrine moving forward as an OP 2. Unlikely sepsis -doesn't have Pyelo-UA neg for nitrites/leuks-did however have ketones and  ? Sp Grav suggesting vol depletion-- antibiotics have been discontinued on 4/4. We will monitor fever curve.   3. Lung nodule? + Pulmonary emboli-He does have on chest x-ray 07/17/14 ? Irregular nodular opacity right mid lung - CT scan of the chest 04/2014 remarkable for resolving pulmonary emboli. No further workup at this stage. Repeat chest x-ray 4-6 weeks versus CT scan low dose contrast 4. History of multiple embolic strokes, first one 1995, status post CVA 04/15/2014-also documented history hyper homocystinemia, PFO and intraseptal no aneurysm. This patient represents an exorbitant risk for recurrent CVA-will need to continue on Xarelto regardless of hemoglobin levels 5. dilutional anemia-hemoccult 07/16/13 neg.  Transfusion trigger less than 8.0.  Repeat labs on d/c showed Hb 8.4 therefore no needs transfusion  6. Chronic systolic heart failure + ISch CM EF 25-30% 04/2014 + PPM [employer] placement for 2/2 prevention V. tach, acute coronary death-patient seems euvolemic.  IV saline cut back and ultimately discontinued 4/4 7. CAD S/P LAD  sten + PCIt-reasonable to continue aspirin + Xarelto at this stage. It appears he is not on any antecedent beta blocker or ACE inhibitor-relative contraindication given tachybradycardia syndrome, neurocardiogenic syncope. 8. 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 This usually use for organic brain disease or Alzheimer's dementia.-Defer to outpatient physician regarding its utility 9. Benign prostatic hypertrophy-on rectal exam on admission no specific prostate enlargement. Continue Flomax 0.4 daily.   10. Risk for aspiration-multiple strokes-consider speech input if needed moving forward 11. Moderate hypoglycemia-wouldn't cover him with any insulin/analogues as OP--did not contribute to symptoms on admit   Consultations:  Cardiology  Discharge Exam: Filed Vitals:   07/19/14 1014  BP: 107/54  Pulse: 67  Temp: 98.1 F (36.7 C)  Resp:     General: fair, sparse speech, no other issues Cardiovascular: s1 s 2no m/r/g Respiratory: clear Slow halting speech  Discharge Instructions   Discharge Instructions    Diet general    Complete by:  As directed   Do not eat a low-salt diet and I want you to drink 2 L of fluid a day     Discharge instructions    Complete by:  As directed   You were diagnosed with dizziness as well as fainting spells and falls probably from low blood pressure It was felt that your low blood sugars could've caused the dizziness that he initially had I would recommend as an outpatient talk with her primary care physician about various medications that can help increase her blood pressure I think you're stable to go home and will need close follow-up with her primary care physician          Discharge Medication List as of 07/19/2014 12:22 PM    CONTINUE these medications which have NOT CHANGED   Details  acetaminophen (TYLENOL) 325 MG tablet Take 2 tablets (650 mg total) by mouth every 6 (six) hours as  needed for mild pain, moderate pain or fever., Starting 11/23/2013, Until Discontinued, OTC    aspirin EC 81 MG EC tablet Take 1 tablet (81 mg total) by mouth daily., Starting 04/18/2014, Until Discontinued, Normal    atorvastatin (LIPITOR) 10 MG tablet Take 1 tablet (10 mg total) by mouth daily at 6 PM., Starting 04/18/2014, Until Discontinued, Normal    donepezil (ARICEPT) 5 MG tablet Take 5 mg by mouth at bedtime., Until Discontinued, Historical Med    latanoprost (XALATAN) 0.005 % ophthalmic solution Place 1 drop into both eyes at bedtime., Starting 06/29/2013, Until Discontinued, Historical Med    tamsulosin (FLOMAX) 0.4 MG CAPS capsule Take 1 capsule (0.4 mg total) by mouth daily., Starting 04/18/2014, Until Discontinued, Normal    insulin aspart (NOVOLOG) 100 UNIT/ML injection CBG 70 - 120: 0 units CBG 121 - 150: 1 unit CBG 151 - 200: 2 units CBG 201 - 250: 3 units CBG 251 - 300: 5 units CBG 301 - 350: 7 units CBG 351 - 400: 9 units, Print    rivaroxaban (XARELTO) 20 MG TABS tablet Take 1 tablet (20 mg total) by mouth daily with supper., Starting 06/23/2014, Until Discontinued, No Print    sertraline (ZOLOFT) 50 MG tablet Take 50 mg by mouth at bedtime. , Starting 05/18/2014, Until Discontinued, Historical Med  STOP taking these medications     glucose monitoring kit (FREESTYLE) monitoring kit        No Known Allergies Follow-up Information    Follow up with [name], MD On 07/21/2014.   Specialty:  Family Medicine   Why:  Appointment with Dr, Alyson Ingles is on 07/21/14 at 11:45am   Contact information:   [place] [address] [phone]       Follow up with Minus Breeding, MD On 08/18/2014.   Specialty:  Cardiology   Why:  Appointment with Dr. Percival Spanish is on May 5th at 3:15   Contact information:   [address] [place] [address] [phone]        The results of significant diagnostics from this hospitalization  (including imaging, microbiology, ancillary and laboratory) are listed below for reference.    Significant Diagnostic Studies: No results found.  Microbiology: No results found for this or any previous visit (from the past 240 hour(s)).   Labs: Basic Metabolic Panel: No results for input(s): NA, K, CL, CO2, GLUCOSE, BUN, CREATININE, CALCIUM, MG, PHOS in the last 168 hours. Liver Function Tests: No results for input(s): AST, ALT, ALKPHOS, BILITOT, PROT, ALBUMIN in the last 168 hours. No results for input(s): LIPASE, AMYLASE in the last 168 hours. No results for input(s): AMMONIA in the last 168 hours. CBC: No results for input(s): WBC, NEUTROABS, HGB, HCT, MCV, PLT in the last 168 hours. Cardiac Enzymes: No results for input(s): CKTOTAL, CKMB, CKMBINDEX, TROPONINI in the last 168 hours. BNP: BNP (last 3 results) No results for input(s): BNP in the last 8760 hours.  ProBNP (last 3 results) No results for input(s): PROBNP in the last 8760 hours.  CBG: No results for input(s): GLUCAP in the last 168 hours.     SignedNita Sells  [facility] 09/02/2014, 5:55 PM

## 2014-09-29 ENCOUNTER — Encounter: Payer: Medicare Other | Admitting: Internal Medicine

## 2014-10-04 DIAGNOSIS — R55 Syncope and collapse: Secondary | ICD-10-CM | POA: Insufficient documentation

## 2014-10-13 ENCOUNTER — Non-Acute Institutional Stay (SKILLED_NURSING_FACILITY): Payer: Medicare Other | Admitting: Internal Medicine

## 2014-10-13 ENCOUNTER — Encounter: Payer: Self-pay | Admitting: Internal Medicine

## 2014-10-13 DIAGNOSIS — E119 Type 2 diabetes mellitus without complications: Secondary | ICD-10-CM

## 2014-10-13 DIAGNOSIS — F0150 Vascular dementia without behavioral disturbance: Secondary | ICD-10-CM

## 2014-10-13 DIAGNOSIS — Z86711 Personal history of pulmonary embolism: Secondary | ICD-10-CM

## 2014-10-13 DIAGNOSIS — I5022 Chronic systolic (congestive) heart failure: Secondary | ICD-10-CM

## 2014-10-13 DIAGNOSIS — Z8669 Personal history of other diseases of the nervous system and sense organs: Secondary | ICD-10-CM | POA: Diagnosis not present

## 2014-10-13 DIAGNOSIS — Z8673 Personal history of transient ischemic attack (TIA), and cerebral infarction without residual deficits: Secondary | ICD-10-CM

## 2014-10-13 NOTE — Progress Notes (Signed)
MRN: [id number] Name: Grant Lara  Sex: male Age: 74 y.o. DOB: 11/03/1940  [place] [facility] Facility/Room:321 Level Of Care: SNF Provider: Inocencio Homes D Emergency Contacts: Extended Emergency Contact Information Primary Emergency Contact: Parkerson,[name] Address: [address]          [place], [place] [address] [name] of [place] Phone: [phone] Mobile Phone: [phone] Relation: Spouse Secondary Emergency Contact: [name],[name]          [place], [place] of [place] Phone: [phone] Relation: Daughter  Code Status: DNR  Allergies: Review of patient's allergies indicates no known allergies.  Chief Complaint  Patient presents with  . New Admit To SNF    HPI: Patient is 74 y.o. male who is Hospice pt admitted to SNF for respite care.  Past Medical History  Diagnosis Date  . Hypertension   . Dilated cardiomyopathy     2/12: EF 30-35%, trivial AI, mild RAE.  EF 2014 40 -45%  . CAD (coronary artery disease)     LHC 9/05 with Dr. Einar Gip:  dLM 20-30%, LAD 85%, oD1 20-30%.  PCI:  Taxus DES to LAD; Dx jailed and tx with POBA.  Last myoview 12/10: inf scar, no ischemia, EF 29%.  . DVT (deep venous thrombosis)   . Pulmonary embolus     chronic coumadin  . Hyperhomocystinemia   . PFO (patent foramen ovale)     Not mentioned on 2014 echo.  [name] HLD (hyperlipidemia)   . Crohn's disease   . Nephrolithiasis   . Diabetes mellitus     11/05/11 "borderline; don't take medications"  . Stroke 1993    "left arm can't hold steady; leg too"  . Arthritis     "used to have a touch in my legs"  . Memory difficulties   . Pneumonia ~ 2011    09-22-13 denies any recent SOB or breathing problems  . Swelling of both ankles      09-22-13 occ.feet, but denies pain.  . Bradycardia 06/01/2014  . Dementia     Past Surgical History  Procedure Laterality Date  . Colon surgery  1994; 1996    "for Crohn's disease"  . Appendectomy    . Implantable cardioverter  defibrillator implant N/A 06/02/2014    Procedure: IMPLANTABLE CARDIOVERTER DEFIBRILLATOR IMPLANT;  Surgeon: [name], MD;  Location[facility] CATH LAB;  Service: Cardiovascular;  Laterality: N/A;      Medication List       This list is accurate as of: 10/13/14 11:59 PM.  Always use your most recent med list.               acetaminophen 325 MG tablet  Commonly known as:  TYLENOL  Take 2 tablets (650 mg total) by mouth every 6 (six) hours as needed for mild pain, moderate pain or fever.     aspirin 81 MG EC tablet  Take 1 tablet (81 mg total) by mouth daily.     atorvastatin 10 MG tablet  Commonly known as:  LIPITOR  Take 1 tablet (10 mg total) by mouth daily at 6 PM.     donepezil 5 MG tablet  Commonly known as:  ARICEPT  Take 5 mg by mouth at bedtime.     insulin aspart 100 UNIT/ML injection  Commonly known as:  novoLOG  CBG 70 - 120: 0 units CBG 121 - 150: 1 unit CBG 151 - 200: 2 units CBG 201 - 250: 3 units CBG 251 - 300: 5 units  CBG 301 - 350: 7 units CBG 351 - 400: 9 units     latanoprost 0.005 % ophthalmic solution  Commonly known as:  XALATAN  Place 1 drop into both eyes at bedtime.     rivaroxaban 20 MG Tabs tablet  Commonly known as:  XARELTO  Take 1 tablet (20 mg total) by mouth daily with supper.     sertraline 50 MG tablet  Commonly known as:  ZOLOFT  Take 50 mg by mouth at bedtime.     tamsulosin 0.4 MG Caps capsule  Commonly known as:  FLOMAX  Take 1 capsule (0.4 mg total) by mouth daily.        No orders of the defined types were placed in this encounter.    Immunization History  Administered Date(s) Administered  . Influenza Whole 02/09/2010  . PPD Test 11/05/2011  . Pneumococcal Polysaccharide-23 02/19/2010    History  Substance Use Topics  . Smoking status: Current Some Day Smoker -- 0.50 packs/day for 53 years    Types: Cigarettes  . Smokeless tobacco: Never Used  . Alcohol Use: No    Family history is noncontributory    Review  of Systems - UTO;nursing voice no concerns.   Filed Vitals:   10/13/14 1412  BP: 104/53  Pulse: 78  Temp: 97.2 F (36.2 C)  Resp: 18    Physical Exam  GENERAL APPEARANCE: Alert,  No acute distress.  SKIN: No diaphoresis rash HEAD: Normocephalic, atraumatic  EYES: Conjunctiva/lids clear. Pupils round, reactive. EOMs intact.  EARS: External exam WNL, canals clear. Hearing grossly normal.  NOSE: No deformity or discharge.  MOUTH/THROAT: Lips w/o lesions  RESPIRATORY: Breathing is even, unlabored. Lung sounds are clear   CARDIOVASCULAR: Heart RRR no murmurs, rubs or gallops. No peripheral edema.   GASTROINTESTINAL: Abdomen is soft, non-tender, not distended w/ normal bowel sounds. GENITOURINARY: Bladder non tender, not distended  MUSCULOSKELETAL: No abnormal joints or musculature NEUROLOGIC:  Cranial nerves 2-12 grossly intact. PSYCHIATRIC: Mood and affect appropriate to situation, no behavioral issues  Patient Active Problem List   Diagnosis Date Noted  . Faintness   . Vascular dementia 09/01/2014  . Generalized weakness 09/01/2014  . 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 Cardiac syncope   . Other specified hypotension   . Chronic systolic CHF (congestive heart failure)   . Hypotension 07/16/2014  . Hypothermia 07/16/2014  . SIRS (systemic inflammatory response syndrome) 07/16/2014  . Pulmonary embolus 07/16/2014  . Late effects of CVA (cerebrovascular accident) 07/16/2014  . Bradycardia 06/01/2014  . Syncope 05/30/2014  . Stroke   . CVA (cerebral infarction)   . Hyperlipidemia   . Coronary artery disease involving native coronary artery of native heart without angina pectoris   . LVH (left ventricular hypertrophy)   . NSVT (nonsustained ventricular tachycardia) 04/16/2014  . CKD (chronic kidney disease) stage 3, GFR 30-59 ml/min 04/16/2014  . Aphasia 04/15/2014  . Abnormal EKG   . Anemia 11/21/2013  . DM2 (diabetes mellitus, type 2) 11/20/2013  . Hx pulmonary  embolism 11/20/2013  . Leukocytosis 11/20/2013  . Hyperglycemia 08/22/2013  . Dehydration 08/21/2013  . ARF (acute renal failure) 08/21/2013  . Abnormal ECG 08/21/2013  . Delirium 07/18/2013  . Small bowel obstruction 07/13/2013  . Encounter for therapeutic drug monitoring 05/11/2013  . Crohn's disease 02/16/2012  . Abnormal CT scan of lung 11/08/2011  . Bilateral leg and foot pain 11/06/2011  . SBO (small bowel obstruction) 11/05/2011  . HLD (hyperlipidemia) 03/28/2011  . Pulmonary  embolism 06/05/2010  . History of embolic stroke 52/48/1859  . DVT (deep venous thrombosis) 06/05/2010  . EMPHYSEMATOUS BLEB 04/06/2010  . C O P D 04/05/2010  . Chronic systolic heart failure 09/31/1216  . Memory loss 04/03/2009  . CHEST PAIN 03/08/2009  . Essential hypertension, benign 02/10/2009  . TOBACCO ABUSE 12/12/2008  . Coronary atherosclerosis 12/12/2008  . CARDIOMYOPATHY 12/12/2008  . ABNORMAL ELECTROCARDIOGRAM 12/12/2008    CBC    Component Value Date/Time   WBC 7.8 07/27/2014 1649   RBC 3.31* 07/27/2014 1649   RBC 3.58* 06/01/2014 1300   HGB 9.6* 07/27/2014 1649   HCT 30.6* 07/27/2014 1649   PLT 210 07/27/2014 1649   MCV 92.4 07/27/2014 1649   LYMPHSABS 0.9 07/27/2014 1649   MONOABS 0.5 07/27/2014 1649   EOSABS 0.0 07/27/2014 1649   BASOSABS 0.0 07/27/2014 1649    CMP     Component Value Date/Time   NA 140 07/27/2014 1649   K 3.9 07/27/2014 1649   CL 105 07/27/2014 1649   CO2 27 07/27/2014 1649   GLUCOSE 107* 07/27/2014 1649   BUN 16 07/27/2014 1649   CREATININE 1.28 07/27/2014 1649   CREATININE 1.34 12/13/2011 1722   CALCIUM 8.9 07/27/2014 1649   PROT 5.3* 07/15/2014 2325   ALBUMIN 2.6* 07/15/2014 2325   AST 28 07/15/2014 2325   ALT 51 07/15/2014 2325   ALKPHOS 92 07/15/2014 2325   BILITOT 0.4 07/15/2014 2325   GFRNONAA 53* 07/27/2014 1649   GFRAA 61* 07/27/2014 1649    Assessment and Plan  Vascular dementia Progressive and chronic- cont aricept and  zoloft  Hx pulmonary embolism Continue ASA and xarelto  History of embolic stroke Continue ASA and xarelto for prevention  Chronic systolic CHF (congestive heart failure) Chronic and stable and on no meds for same ; last EF -25%  DM2 (diabetes mellitus, type 2) Does not use insulin at home;a1c 2/32015 was 6.5    Hennie Duos, MD

## 2014-10-19 ENCOUNTER — Encounter: Payer: Self-pay | Admitting: Internal Medicine

## 2014-10-19 NOTE — Assessment & Plan Note (Signed)
Progressive and chronic- cont aricept and zoloft

## 2014-10-19 NOTE — Assessment & Plan Note (Signed)
Does not use insulin at home;a1c 2/32015 was 6.5

## 2014-10-19 NOTE — Assessment & Plan Note (Signed)
Continue ASA and xarelto for prevention

## 2014-10-19 NOTE — Assessment & Plan Note (Signed)
Continue ASA and xarelto

## 2014-10-19 NOTE — Assessment & Plan Note (Signed)
Chronic and stable and on no meds for same ; last EF -25%

## 2014-10-27 ENCOUNTER — Non-Acute Institutional Stay (SKILLED_NURSING_FACILITY): Payer: Medicare Other | Admitting: Internal Medicine

## 2014-10-27 ENCOUNTER — Encounter: Payer: Self-pay | Admitting: Internal Medicine

## 2014-10-27 DIAGNOSIS — E119 Type 2 diabetes mellitus without complications: Secondary | ICD-10-CM | POA: Diagnosis not present

## 2014-10-27 DIAGNOSIS — F0280 Dementia in other diseases classified elsewhere without behavioral disturbance: Secondary | ICD-10-CM

## 2014-10-27 DIAGNOSIS — I5022 Chronic systolic (congestive) heart failure: Secondary | ICD-10-CM | POA: Diagnosis not present

## 2014-10-27 DIAGNOSIS — F329 Major depressive disorder, single episode, unspecified: Secondary | ICD-10-CM

## 2014-10-27 DIAGNOSIS — I2699 Other pulmonary embolism without acute cor pulmonale: Secondary | ICD-10-CM | POA: Diagnosis not present

## 2014-10-27 DIAGNOSIS — F0150 Vascular dementia without behavioral disturbance: Secondary | ICD-10-CM | POA: Diagnosis not present

## 2014-10-27 DIAGNOSIS — F0393 Unspecified dementia, unspecified severity, with mood disturbance: Secondary | ICD-10-CM

## 2014-10-27 NOTE — Progress Notes (Signed)
MRN: [id number] Name: Grant Lara  Sex: male Age: 74 y.o. DOB: Feb 23, 1941  [place] #: Grant Lara Facility/Room: 205 Level Of Care: SNF Provider: Inocencio Homes D Emergency Contacts: Extended Emergency Contact Information Primary Emergency Contact: [name],[name] Address: [address]          [place], [place] [address] [name] of [place] Phone: [phone] Mobile Phone: [phone] Relation: Spouse Secondary Emergency Contact: [name],[name]          [place], [place] Phone: [phone] Relation: Daughter  Code Status: DNR  Allergies: Review of patient's allergies indicates no known allergies.  Chief Complaint  Patient presents with  . New Admit To SNF    HPI: Patient is 74 y.o. male who is admitted to SNF from home for respite care. He is a Hospice pt 2/2 progressive dementia and chronic progressive CHF.  Past Medical History  Diagnosis Date  . Hypertension   . Dilated cardiomyopathy     2/12: EF 30-35%, trivial AI, mild RAE.  EF 2014 40 -45%  . CAD (coronary artery disease)     LHC 9/05 with Dr. Einar Gip:  dLM 20-30%, LAD 85%, oD1 20-30%.  PCI:  Taxus DES to LAD; Dx jailed and tx with POBA.  Last myoview 12/10: inf scar, no ischemia, EF 29%.  . DVT (deep venous thrombosis)   . Pulmonary embolus     chronic coumadin  . Hyperhomocystinemia   . PFO (patent foramen ovale)     Not mentioned on 2014 echo.  [name] HLD (hyperlipidemia)   . Crohn's disease   . Nephrolithiasis   . Diabetes mellitus     11/05/11 "borderline; don't take medications"  . Stroke 1993    "left arm can't hold steady; leg too"  . Arthritis     "used to have a touch in my legs"  . Memory difficulties   . Pneumonia ~ 2011    09-22-13 denies any recent SOB or breathing problems  . Swelling of both ankles      09-22-13 occ.feet, but denies pain.  . Bradycardia 06/01/2014  . Dementia     Past Surgical History  Procedure Laterality Date  . Colon surgery  1994; 1996     "for Crohn's disease"  . Appendectomy    . Implantable cardioverter defibrillator implant N/A 06/02/2014    Procedure: IMPLANTABLE CARDIOVERTER DEFIBRILLATOR IMPLANT;  Surgeon: [name], MD;  Location: [facility] CATH LAB;  Service: Cardiovascular;  Laterality: N/A;      Medication List       This list is accurate as of: 10/27/14 11:59 PM.  Always use your most recent med list.               acetaminophen 325 MG tablet  Commonly known as:  TYLENOL  Take 2 tablets (650 mg total) by mouth every 6 (six) hours as needed for mild pain, moderate pain or fever.     aspirin 81 MG EC tablet  Take 1 tablet (81 mg total) by mouth daily.     atorvastatin 10 MG tablet  Commonly known as:  LIPITOR  Take 1 tablet (10 mg total) by mouth daily at 6 PM.     donepezil 5 MG tablet  Commonly known as:  ARICEPT  Take 5 mg by mouth at bedtime.     insulin aspart 100 UNIT/ML injection  Commonly known as:  novoLOG  CBG 70 - 120: 0 units CBG 121 - 150: 1 unit CBG 151 - 200: 2  units CBG 201 - 250: 3 units CBG 251 - 300: 5 units CBG 301 - 350: 7 units CBG 351 - 400: 9 units     latanoprost 0.005 % ophthalmic solution  Commonly known as:  XALATAN  Place 1 drop into both eyes at bedtime.     rivaroxaban 20 MG Tabs tablet  Commonly known as:  XARELTO  Take 1 tablet (20 mg total) by mouth daily with supper.     sertraline 50 MG tablet  Commonly known as:  ZOLOFT  Take 50 mg by mouth at bedtime.     tamsulosin 0.4 MG Caps capsule  Commonly known as:  FLOMAX  Take 1 capsule (0.4 mg total) by mouth daily.        No orders of the defined types were placed in this encounter.    Immunization History  Administered Date(s) Administered  . Influenza Whole 02/09/2010  . PPD Test 11/05/2011  . Pneumococcal Polysaccharide-23 02/19/2010    History  Substance Use Topics  . Smoking status: Current Some Day Smoker -- 0.50 packs/day for 53 years    Types: Cigarettes  . Smokeless tobacco: Never Used   . Alcohol Use: No    Family history is  + DM2  Review of Systems  DATA OBTAINED: from patient, nurse GENERAL:  no fevers, fatigue, appetite changes SKIN: No itching, rash or wounds EYES: No eye pain, redness, discharge EARS: No earache, tinnitus, change in hearing NOSE: No congestion, drainage or bleeding  MOUTH/THROAT: No mouth or tooth pain, No sore throat RESPIRATORY: No cough, wheezing, SOB CARDIAC: No chest pain, palpitations, lower extremity edema  GI: No abdominal pain, No N/V/D or constipation, No heartburn or reflux  GU: No dysuria, frequency or urgency, or incontinence  MUSCULOSKELETAL: No unrelieved bone/joint pain NEUROLOGIC: No headache, dizziness or focal weakness PSYCHIATRIC: No c/o anxiety or sadness   Filed Vitals:   10/27/14 2025  BP: 129/87  Pulse: 67  Temp: 98.2 F (36.8 C)  Resp: 18    Physical Exam  GENERAL APPEARANCE: Alert, minconversant,  No acute distress.  SKIN: No diaphoresis rash HEAD: Normocephalic, atraumatic  EYES: Conjunctiva/lids clear. Pupils round, reactive. EOMs intact.  EARS: External exam WNL, canals clear. Hearing grossly normal.  NOSE: No deformity or discharge.  MOUTH/THROAT: Lips w/o lesions  RESPIRATORY: Breathing is even, unlabored. Lung sounds are clear   CARDIOVASCULAR: Heart RRR no murmurs, rubs or gallops. No peripheral edema.   GASTROINTESTINAL: Abdomen is soft, non-tender, not distended w/ normal bowel sounds. GENITOURINARY: Bladder non tender, not distended  MUSCULOSKELETAL: No abnormal joints or musculature NEUROLOGIC:  Cranial nerves 2-12 grossly intact  PSYCHIATRIC: Mood and affect appropriate to situation, no behavioral issues  Patient Active Problem List   Diagnosis Date Noted  . Faintness   . Vascular dementia 09/01/2014  . Generalized weakness 09/01/2014  . 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 Cardiac syncope   . Other specified hypotension   . Chronic systolic CHF (congestive heart failure)   .  Hypotension 07/16/2014  . Hypothermia 07/16/2014  . SIRS (systemic inflammatory response syndrome) 07/16/2014  . Pulmonary embolus 07/16/2014  . Late effects of CVA (cerebrovascular accident) 07/16/2014  . Bradycardia 06/01/2014  . Syncope 05/30/2014  . Stroke   . CVA (cerebral infarction)   . Hyperlipidemia   . Coronary artery disease involving native coronary artery of native heart without angina pectoris   . LVH (left ventricular hypertrophy)   . NSVT (nonsustained ventricular tachycardia) 04/16/2014  . CKD (chronic kidney  disease) stage 3, GFR 30-59 ml/min 04/16/2014  . Aphasia 04/15/2014  . Abnormal EKG   . Anemia 11/21/2013  . DM2 (diabetes mellitus, type 2) 11/20/2013  . Hx pulmonary embolism 11/20/2013  . Leukocytosis 11/20/2013  . Hyperglycemia 08/22/2013  . Dehydration 08/21/2013  . ARF (acute renal failure) 08/21/2013  . Abnormal ECG 08/21/2013  . Delirium 07/18/2013  . Small bowel obstruction 07/13/2013  . Encounter for therapeutic drug monitoring 05/11/2013  . Crohn's disease 02/16/2012  . Abnormal CT scan of lung 11/08/2011  . Bilateral leg and foot pain 11/06/2011  . SBO (small bowel obstruction) 11/05/2011  . HLD (hyperlipidemia) 03/28/2011  . Pulmonary embolism 06/05/2010  . History of embolic stroke 44/51/4604  . DVT (deep venous thrombosis) 06/05/2010  . EMPHYSEMATOUS BLEB 04/06/2010  . C O P D 04/05/2010  . Chronic systolic heart failure 79/98/7215  . Memory loss 04/03/2009  . CHEST PAIN 03/08/2009  . Essential hypertension, benign 02/10/2009  . TOBACCO ABUSE 12/12/2008  . Coronary atherosclerosis 12/12/2008  . CARDIOMYOPATHY 12/12/2008  . ABNORMAL ELECTROCARDIOGRAM 12/12/2008    CBC    Component Value Date/Time   WBC 7.8 07/27/2014 1649   RBC 3.31* 07/27/2014 1649   RBC 3.58* 06/01/2014 1300   HGB 9.6* 07/27/2014 1649   HCT 30.6* 07/27/2014 1649   PLT 210 07/27/2014 1649   MCV 92.4 07/27/2014 1649   LYMPHSABS 0.9 07/27/2014 1649   MONOABS  0.5 07/27/2014 1649   EOSABS 0.0 07/27/2014 1649   BASOSABS 0.0 07/27/2014 1649    CMP     Component Value Date/Time   NA 140 07/27/2014 1649   K 3.9 07/27/2014 1649   CL 105 07/27/2014 1649   CO2 27 07/27/2014 1649   GLUCOSE 107* 07/27/2014 1649   BUN 16 07/27/2014 1649   CREATININE 1.28 07/27/2014 1649   CREATININE 1.34 12/13/2011 1722   CALCIUM 8.9 07/27/2014 1649   PROT 5.3* 07/15/2014 2325   ALBUMIN 2.6* 07/15/2014 2325   AST 28 07/15/2014 2325   ALT 51 07/15/2014 2325   ALKPHOS 92 07/15/2014 2325   BILITOT 0.4 07/15/2014 2325   GFRNONAA 53* 07/27/2014 1649   GFRAA 61* 07/27/2014 1649    Assessment and Plan  Pulmonary embolus Pt was on chronic coumadin, now on xarelto, which will be continued.  Chronic systolic CHF (congestive heart failure) Last EF was 25%; pt will be continued on no meds  DM2 (diabetes mellitus, type 2) Diet controlled now; no change in home regimen  Vascular dementia Chronic and proegressive ; Plan is to continue aricept  Depression due to dementia Plan to conti nue zoloft 50 mg qHS    Hennie Duos, MD

## 2014-10-30 ENCOUNTER — Emergency Department (HOSPITAL_COMMUNITY): Payer: Medicare Other

## 2014-10-30 ENCOUNTER — Inpatient Hospital Stay (HOSPITAL_COMMUNITY)
Admission: EM | Admit: 2014-10-30 | Discharge: 2014-11-01 | DRG: 640 | Disposition: A | Discharge status: Skilled Nursing Facility | Payer: Medicare Other | Attending: Internal Medicine | Admitting: Internal Medicine

## 2014-10-30 ENCOUNTER — Encounter: Payer: Self-pay | Admitting: Internal Medicine

## 2014-10-30 ENCOUNTER — Encounter (HOSPITAL_COMMUNITY): Payer: Self-pay | Admitting: *Deleted

## 2014-10-30 DIAGNOSIS — R627 Adult failure to thrive: Principal | ICD-10-CM | POA: Diagnosis present

## 2014-10-30 DIAGNOSIS — F1010 Alcohol abuse, uncomplicated: Secondary | ICD-10-CM | POA: Diagnosis present

## 2014-10-30 DIAGNOSIS — Z8673 Personal history of transient ischemic attack (TIA), and cerebral infarction without residual deficits: Secondary | ICD-10-CM

## 2014-10-30 DIAGNOSIS — F329 Major depressive disorder, single episode, unspecified: Secondary | ICD-10-CM | POA: Diagnosis present

## 2014-10-30 DIAGNOSIS — I420 Dilated cardiomyopathy: Secondary | ICD-10-CM | POA: Diagnosis present

## 2014-10-30 DIAGNOSIS — Z716 Tobacco abuse counseling: Secondary | ICD-10-CM | POA: Diagnosis present

## 2014-10-30 DIAGNOSIS — I10 Essential (primary) hypertension: Secondary | ICD-10-CM

## 2014-10-30 DIAGNOSIS — M1990 Unspecified osteoarthritis, unspecified site: Secondary | ICD-10-CM | POA: Diagnosis present

## 2014-10-30 DIAGNOSIS — N400 Enlarged prostate without lower urinary tract symptoms: Secondary | ICD-10-CM | POA: Diagnosis present

## 2014-10-30 DIAGNOSIS — Z86711 Personal history of pulmonary embolism: Secondary | ICD-10-CM | POA: Diagnosis not present

## 2014-10-30 DIAGNOSIS — E785 Hyperlipidemia, unspecified: Secondary | ICD-10-CM | POA: Diagnosis present

## 2014-10-30 DIAGNOSIS — F0393 Unspecified dementia, unspecified severity, with mood disturbance: Secondary | ICD-10-CM | POA: Diagnosis present

## 2014-10-30 DIAGNOSIS — F17200 Nicotine dependence, unspecified, uncomplicated: Secondary | ICD-10-CM | POA: Diagnosis present

## 2014-10-30 DIAGNOSIS — N390 Urinary tract infection, site not specified: Secondary | ICD-10-CM | POA: Diagnosis present

## 2014-10-30 DIAGNOSIS — I129 Hypertensive chronic kidney disease with stage 1 through stage 4 chronic kidney disease, or unspecified chronic kidney disease: Secondary | ICD-10-CM | POA: Diagnosis present

## 2014-10-30 DIAGNOSIS — I82409 Acute embolism and thrombosis of unspecified deep veins of unspecified lower extremity: Secondary | ICD-10-CM | POA: Diagnosis present

## 2014-10-30 DIAGNOSIS — F17210 Nicotine dependence, cigarettes, uncomplicated: Secondary | ICD-10-CM | POA: Diagnosis present

## 2014-10-30 DIAGNOSIS — N183 Chronic kidney disease, stage 3 unspecified: Secondary | ICD-10-CM | POA: Diagnosis present

## 2014-10-30 DIAGNOSIS — E43 Unspecified severe protein-calorie malnutrition: Secondary | ICD-10-CM | POA: Diagnosis present

## 2014-10-30 DIAGNOSIS — Z86718 Personal history of other venous thrombosis and embolism: Secondary | ICD-10-CM

## 2014-10-30 DIAGNOSIS — Z66 Do not resuscitate: Secondary | ICD-10-CM | POA: Diagnosis present

## 2014-10-30 DIAGNOSIS — Z681 Body mass index (BMI) 19 or less, adult: Secondary | ICD-10-CM

## 2014-10-30 DIAGNOSIS — F0150 Vascular dementia without behavioral disturbance: Secondary | ICD-10-CM | POA: Diagnosis present

## 2014-10-30 DIAGNOSIS — I110 Hypertensive heart disease with heart failure: Secondary | ICD-10-CM | POA: Diagnosis present

## 2014-10-30 DIAGNOSIS — R350 Frequency of micturition: Secondary | ICD-10-CM | POA: Diagnosis present

## 2014-10-30 DIAGNOSIS — Z720 Tobacco use: Secondary | ICD-10-CM

## 2014-10-30 DIAGNOSIS — I5022 Chronic systolic (congestive) heart failure: Secondary | ICD-10-CM | POA: Diagnosis present

## 2014-10-30 DIAGNOSIS — Z7901 Long term (current) use of anticoagulants: Secondary | ICD-10-CM

## 2014-10-30 DIAGNOSIS — I739 Peripheral vascular disease, unspecified: Secondary | ICD-10-CM | POA: Diagnosis present

## 2014-10-30 DIAGNOSIS — R531 Weakness: Secondary | ICD-10-CM

## 2014-10-30 DIAGNOSIS — R310 Gross hematuria: Secondary | ICD-10-CM | POA: Diagnosis present

## 2014-10-30 DIAGNOSIS — K5090 Crohn's disease, unspecified, without complications: Secondary | ICD-10-CM | POA: Diagnosis present

## 2014-10-30 DIAGNOSIS — E860 Dehydration: Secondary | ICD-10-CM | POA: Diagnosis present

## 2014-10-30 DIAGNOSIS — E1122 Type 2 diabetes mellitus with diabetic chronic kidney disease: Secondary | ICD-10-CM | POA: Diagnosis present

## 2014-10-30 DIAGNOSIS — I2510 Atherosclerotic heart disease of native coronary artery without angina pectoris: Secondary | ICD-10-CM | POA: Diagnosis present

## 2014-10-30 DIAGNOSIS — E119 Type 2 diabetes mellitus without complications: Secondary | ICD-10-CM

## 2014-10-30 DIAGNOSIS — R4182 Altered mental status, unspecified: Secondary | ICD-10-CM | POA: Diagnosis present

## 2014-10-30 DIAGNOSIS — F0280 Dementia in other diseases classified elsewhere without behavioral disturbance: Secondary | ICD-10-CM

## 2014-10-30 LAB — URINE MICROSCOPIC-ADD ON

## 2014-10-30 LAB — URINALYSIS, ROUTINE W REFLEX MICROSCOPIC
Glucose, UA: NEGATIVE mg/dL
Ketones, ur: NEGATIVE mg/dL
NITRITE: NEGATIVE
Protein, ur: 30 mg/dL — AB
SPECIFIC GRAVITY, URINE: 1.035 — AB (ref 1.005–1.030)
UROBILINOGEN UA: 0.2 mg/dL (ref 0.0–1.0)
pH: 5.5 (ref 5.0–8.0)

## 2014-10-30 LAB — CBC
HCT: 29.4 % — ABNORMAL LOW (ref 39.0–52.0)
Hemoglobin: 8.6 g/dL — ABNORMAL LOW (ref 13.0–17.0)
MCH: 21.7 pg — ABNORMAL LOW (ref 26.0–34.0)
MCHC: 29.3 g/dL — AB (ref 30.0–36.0)
MCV: 74.2 fL — ABNORMAL LOW (ref 78.0–100.0)
Platelets: 227 10*3/uL (ref 150–400)
RBC: 3.96 MIL/uL — ABNORMAL LOW (ref 4.22–5.81)
RDW: 16.6 % — ABNORMAL HIGH (ref 11.5–15.5)
WBC: 6.6 10*3/uL (ref 4.0–10.5)

## 2014-10-30 LAB — BASIC METABOLIC PANEL
Anion gap: 9 (ref 5–15)
BUN: 18 mg/dL (ref 6–20)
CALCIUM: 8.5 mg/dL — AB (ref 8.9–10.3)
CO2: 22 mmol/L (ref 22–32)
CREATININE: 1.29 mg/dL — AB (ref 0.61–1.24)
Chloride: 107 mmol/L (ref 101–111)
GFR calc Af Amer: >60 mL/min (ref >60)
GFR calc non Af Amer: 52 mL/min — ABNORMAL LOW (ref >60)
Glucose, Bld: 168 mg/dL — ABNORMAL HIGH (ref 65–99)
Potassium: 3.8 mmol/L (ref 3.5–5.1)
Sodium: 138 mmol/L (ref 135–145)

## 2014-10-30 LAB — TROPONIN I: Troponin I: <0.03 ng/mL (ref <0.031)

## 2014-10-30 MED ORDER — SODIUM CHLORIDE 0.9 % IV BOLUS (SEPSIS)
500.0000 mL | Freq: Once | INTRAVENOUS | Status: AC
  Administered 2014-10-30: 500 mL via INTRAVENOUS

## 2014-10-30 NOTE — Assessment & Plan Note (Signed)
Pt was on chronic coumadin, now on xarelto, which will be continued.

## 2014-10-30 NOTE — Assessment & Plan Note (Signed)
Chronic and proegressive ; Plan is to continue aricept

## 2014-10-30 NOTE — ED Notes (Signed)
Pt to CT

## 2014-10-30 NOTE — ED Provider Notes (Signed)
CSN: [id number]     Arrival date & time 10/30/14  1934 History   First MD Initiated Contact with Patient 10/30/14 1954     Chief Complaint  Patient presents with  . Weakness  . Generalized Body Aches     (Consider location/radiation/quality/duration/timing/severity/associated sxs/prior Treatment) HPI Comments: Patient presents to the emergency department for evaluation of generalized weakness.. Patient patient currently lives with his wife. She is his primary caregiver. He was previously under hospice care, but was released from hospice this week. Wife reports that she does not have any aids were nurses coming to the home to help her. She was unable to get him out of bed today   Past Medical History  Diagnosis Date  . Hypertension   . Dilated cardiomyopathy     2/12: EF 30-35%, trivial AI, mild RAE.  EF 2014 40 -45%  . CAD (coronary artery disease)     LHC 9/05 with Dr. Einar Gip:  dLM 20-30%, LAD 85%, oD1 20-30%.  PCI:  Taxus DES to LAD; Dx jailed and tx with POBA.  Last myoview 12/10: inf scar, no ischemia, EF 29%.  . DVT (deep venous thrombosis)   . Pulmonary embolus     chronic coumadin  . Hyperhomocystinemia   . PFO (patent foramen ovale)     Not mentioned on 2014 echo.  [name] HLD (hyperlipidemia)   . Crohn's disease   . Nephrolithiasis   . Diabetes mellitus     11/05/11 "borderline; don't take medications"  . Stroke 1993    "left arm can't hold steady; leg too"  . Arthritis     "used to have a touch in my legs"  . Memory difficulties   . Pneumonia ~ 2011    09-22-13 denies any recent SOB or breathing problems  . Swelling of both ankles      09-22-13 occ.feet, but denies pain.  . Bradycardia 06/01/2014  . Dementia    Past Surgical History  Procedure Laterality Date  . Colon surgery  1994; 1996    "for Crohn's disease"  . Appendectomy    . Implantable cardioverter defibrillator implant N/A 06/02/2014    Procedure: IMPLANTABLE CARDIOVERTER DEFIBRILLATOR IMPLANT;  Surgeon:  [name], MD;  Location: [facility] CATH LAB;  Service: Cardiovascular;  Laterality: N/A;   Family History  Problem Relation Age of Onset  . Diabetes type II Mother   . CAD Father   . Diabetes type II Brother    History  Substance Use Topics  . Smoking status: Current Some Day Smoker -- 0.50 packs/day for 53 years    Types: Cigarettes  . Smokeless tobacco: Never Used  . Alcohol Use: No    Review of Systems  Constitutional: Positive for fatigue.  Musculoskeletal: Positive for arthralgias.  All other systems reviewed and are negative.     Allergies  Review of patient's allergies indicates no known allergies.  Home Medications   Prior to Admission medications   Medication Sig Start Date End Date Taking? Authorizing Provider  acetaminophen (TYLENOL) 325 MG tablet Take 2 tablets (650 mg total) by mouth every 6 (six) hours as needed for mild pain, moderate pain or fever. 11/23/13  Yes Modena Jansky, MD  atorvastatin (LIPITOR) 10 MG tablet Take 1 tablet (10 mg total) by mouth daily at 6 PM. Patient taking differently: Take 10 mg by mouth daily.  04/18/14  Yes Maryann Mikhail, DO  donepezil (ARICEPT) 5 MG tablet Take 5 mg by mouth at bedtime.   Yes Historical  Provider, MD  eszopiclone (LUNESTA) 1 MG TABS tablet Take 1 mg by mouth at bedtime as needed for sleep. for sleep 10/20/14  Yes Historical Provider, MD  latanoprost (XALATAN) 0.005 % ophthalmic solution Place 1 drop into both eyes at bedtime. 06/29/13  Yes Historical Provider, MD  Oxycodone HCl 10 MG TABS Take 10-20 mg by mouth every 4 (four) hours as needed (pain).  08/24/14  Yes Historical Provider, MD  rivaroxaban (XARELTO) 20 MG TABS tablet Take 1 tablet (20 mg total) by mouth daily with supper. 06/23/14  Yes Hosie Poisson, MD  sertraline (ZOLOFT) 50 MG tablet Take 50 mg by mouth daily.  05/18/14  Yes Historical Provider, MD  tamsulosin (FLOMAX) 0.4 MG CAPS capsule Take 1 capsule (0.4 mg total) by mouth daily. Patient taking  differently: Take 0.4 mg by mouth daily after breakfast.  04/18/14  Yes Maryann Mikhail, DO  Vitamins A & D (VITAMIN A & D) ointment Apply 1 application topically daily as needed for dry skin.   Yes Historical Provider, MD  aspirin EC 81 MG EC tablet Take 1 tablet (81 mg total) by mouth daily. Patient not taking: Reported on 07/16/2014 04/18/14   Maryann Mikhail, DO  insulin aspart (NOVOLOG) 100 UNIT/ML injection CBG 70 - 120: 0 units CBG 121 - 150: 1 unit CBG 151 - 200: 2 units CBG 201 - 250: 3 units CBG 251 - 300: 5 units CBG 301 - 350: 7 units CBG 351 - 400: 9 units Patient not taking: Reported on 07/16/2014 06/04/14   Hosie Poisson, MD   BP 140/70 mmHg  Pulse 60  Temp(Src) 98.6 F (37 C) (Oral)  Resp 12  Ht 5' 7"  (1.702 m)  Wt 115 lb (52.164 kg)  BMI 18.01 kg/m2  SpO2 100% Physical Exam  Constitutional: He appears well-developed and well-nourished. He appears cachectic. No distress.  HENT:  Head: Normocephalic and atraumatic.  Right Ear: Hearing normal.  Left Ear: Hearing normal.  Nose: Nose normal.  Mouth/Throat: Oropharynx is clear and moist and mucous membranes are normal.  Eyes: Conjunctivae and EOM are normal. Pupils are equal, round, and reactive to light.  Neck: Normal range of motion. Neck supple.  Cardiovascular: Regular rhythm, S1 normal and S2 normal.  Exam reveals no gallop and no friction rub.   No murmur heard. Pulmonary/Chest: Effort normal and breath sounds normal. No respiratory distress. He exhibits no tenderness.  Abdominal: Soft. Normal appearance and bowel sounds are normal. There is no hepatosplenomegaly. There is no tenderness. There is no rebound, no guarding, no tenderness at McBurney's point and negative Murphy's sign. No hernia.  Musculoskeletal: Normal range of motion.  Neurological: He is alert. He has normal strength. No cranial nerve deficit or sensory deficit. Coordination normal. GCS eye subscore is 4. GCS verbal subscore is 4. GCS motor subscore is 6.   Patient extremely weak, cannot raise his head or sit up  Skin: Skin is warm, dry and intact. No rash noted. No cyanosis.  Psychiatric: He has a normal mood and affect. His speech is normal and behavior is normal. Thought content normal.  Nursing note and vitals reviewed.   ED Course  Procedures (including critical care time) Labs Review Labs Reviewed  BASIC METABOLIC PANEL - Abnormal; Notable for the following:    Glucose, Bld 168 (*)    Creatinine, Ser 1.29 (*)    Calcium 8.5 (*)    GFR calc non Af Amer 52 (*)    All other components within normal limits  CBC - Abnormal; Notable for the following:    RBC 3.96 (*)    Hemoglobin 8.6 (*)    HCT 29.4 (*)    MCV 74.2 (*)    MCH 21.7 (*)    MCHC 29.3 (*)    RDW 16.6 (*)    All other components within normal limits  URINALYSIS, ROUTINE W REFLEX MICROSCOPIC (NOT AT [facility]) - Abnormal; Notable for the following:    Color, Urine AMBER (*)    APPearance CLOUDY (*)    Specific Gravity, Urine 1.035 (*)    Hgb urine dipstick LARGE (*)    Bilirubin Urine SMALL (*)    Protein, ur 30 (*)    Leukocytes, UA SMALL (*)    All other components within normal limits  TROPONIN I  URINE MICROSCOPIC-ADD ON    Imaging Review Ct Head Wo Contrast  10/30/2014   CLINICAL DATA:  Acute onset of altered mental status. Initial encounter.  EXAM: CT HEAD WITHOUT CONTRAST  TECHNIQUE: Contiguous axial images were obtained from the base of the skull through the vertex without intravenous contrast.  COMPARISON:  CT of the head performed 07/16/2014  FINDINGS: There is no evidence of acute infarction, mass lesion, or intra- or extra-axial hemorrhage on CT.  Moderately severe cortical volume loss is noted. Underlying cerebellar atrophy is seen. Chronic lacunar infarcts are seen within the basal ganglia and right thalamus. Chronic infarcts are noted within the cerebellar hemispheres bilaterally. Underlying small chronic infarcts are noted near the convexities  bilaterally. Scattered periventricular and subcortical white matter change likely reflects small vessel ischemic microangiopathy.  The brainstem and fourth ventricle are within normal limits. No mass effect or midline shift is seen.  There is no evidence of fracture; visualized osseous structures are unremarkable in appearance. The orbits are within normal limits. The paranasal sinuses and mastoid air cells are well-aerated. No significant soft tissue abnormalities are seen.  IMPRESSION: 1. No acute intracranial pathology seen on CT. 2. Moderately severe cortical volume loss and scattered small vessel ischemic microangiopathy. 3. Chronic infarcts within the cerebellar hemispheres bilaterally, and small chronic infarcts near the convexities bilaterally. Chronic lacunar infarcts within the basal ganglia and right thalamus.   Electronically Signed   By: Garald Balding M.D.   On: 10/30/2014 21:21   Dg Chest Port 1 View  10/30/2014   CLINICAL DATA:  74 year old male with altered mental status  EXAM: PORTABLE CHEST - 1 VIEW  COMPARISON:  Radiograph dated 07/27/2014  FINDINGS: Single-view of the chest demonstrates clear lungs. There is no pleural effusion, pneumothorax. The cardiomediastinal silhouette is within normal limits. Left pectoral AICD device. The osseous structures are grossly unremarkable.  IMPRESSION: No active disease.   Electronically Signed   By: Anner Crete M.D.   On: 10/30/2014 20:42     EKG Interpretation   Date/Time:  Sunday October 30 2014 19:52:25 EDT Ventricular Rate:  64 PR Interval:  137 QRS Duration: 81 QT Interval:  400 QTC Calculation: 413 R Axis:   61 Text Interpretation:  Sinus rhythm Left ventricular hypertrophy Anterior Q  waves, possibly due to LVH Nonspecific T abnormalities, lateral leads No  significant change since last tracing Confirmed by POLLINA  MD,  CHRISTOPHER [phone]) on 10/30/2014 7:59:08 PM      MDM   Final diagnoses:  Mental status change     Patient presents to the ER for evaluation of generalized weakness. Patient primary care for by his wife. She was unable to get him out of bed  today because of severe weakness. Upon arrival he is complaining of generalized pain and weakness, does not answer questions appropriately. She has not been eating or drinking, likely dehydrated. He was hypotensive upon arrival, improved with IV fluids. Patient will require observation for further management.   Orpah Greek, MD 10/30/14 2312

## 2014-10-30 NOTE — ED Notes (Signed)
Provider notified of lack of pulse in L foot, provider bedside and verified that foot was receiving blood flow.

## 2014-10-30 NOTE — ED Notes (Signed)
Gave pt a few ice chips, per [place] - RN

## 2014-10-30 NOTE — Assessment & Plan Note (Signed)
Diet controlled now; no change in home regimen

## 2014-10-30 NOTE — Assessment & Plan Note (Signed)
Plan to conti nue zoloft 50 mg qHS

## 2014-10-30 NOTE — ED Notes (Signed)
Pt given Ginger Ale and [place] sandwich. Wife at bedside feeding him

## 2014-10-30 NOTE — H&P (Signed)
[facility] History and Physical  Grant Lara EVO:[id number] DOB: [birth year] DOA: 10/30/2014  Referring physician: ED physician PCP: [name], MD  Specialists:   Chief Complaint: Generalized weakness and increased urinary frequency  HPI: Grant Lara is a 74 y.o. male with PMH of dementia, hypertension, hyperlipidemia, diabetes mellitus, depression, CAD, DVT, PE on Xarelto, PFO, chronic disease, stroke, BPH, systolic congestive heart failure (EF 20-25%), who presents with generalized weakness and increased urinary frequency.  Patient reports that he had been under hospice care and was released from hospice on Friday this week. Since she came home, she has been feeling weak, which has been progressively getting worse. She denies dysuria, burning on urination, but states that he has an increased urinary frequency. Patient does not have chest pain, shortness breath, cough, abdominal pain, diarrhea, unilateral weakness or leg edema. Patient patient currently lives with his wife. She is his primary caregiver. Wife reports that she does not have any aids or nurses coming to the home to help her. She was unable to get him out of bed today.   In ED, patient was found to have negative troponin, normal temperature, no tachycardia, stable renal function, negative chest x-ray. Urinalysis showed small amount of leukocyte and macroscopic hematuria.  Where does patient live?   At home   Can patient participate in ADLs?   None  Review of Systems:   General: no fevers, chills, no changes in body weight, has poor appetite, has fatigue HEENT: no blurry vision, hearing changes or sore throat Pulm: no dyspnea, coughing, wheezing CV: no chest pain, palpitations Abd: no nausea, vomiting, abdominal pain, diarrhea, constipation GU: no dysuria, burning on urination, has an increased urinary frequency, no hematuria  Ext: no leg edema Neuro: no unilateral weakness, numbness, or tingling, no vision  change or hearing loss Skin: no rash MSK: No muscle spasm, no deformity, no limitation of range of movement in spin Heme: No easy bruising.  Travel history: No recent long distant travel.  Allergy: No Known Allergies  Past Medical History  Diagnosis Date  . Hypertension   . Dilated cardiomyopathy     2/12: EF 30-35%, trivial AI, mild RAE.  EF 2014 40 -45%  . CAD (coronary artery disease)     LHC 9/05 with Dr. Einar Gip:  dLM 20-30%, LAD 85%, oD1 20-30%.  PCI:  Taxus DES to LAD; Dx jailed and tx with POBA.  Last myoview 12/10: inf scar, no ischemia, EF 29%.  . DVT (deep venous thrombosis)   . Pulmonary embolus     chronic coumadin  . Hyperhomocystinemia   . PFO (patent foramen ovale)     Not mentioned on 2014 echo.  [name] HLD (hyperlipidemia)   . Crohn's disease   . Nephrolithiasis   . Diabetes mellitus     11/05/11 "borderline; don't take medications"  . Stroke 1993    "left arm can't hold steady; leg too"  . Arthritis     "used to have a touch in my legs"  . Memory difficulties   . Pneumonia ~ 2011    09-22-13 denies any recent SOB or breathing problems  . Swelling of both ankles      09-22-13 occ.feet, but denies pain.  . Bradycardia 06/01/2014  . Dementia     Past Surgical History  Procedure Laterality Date  . Colon surgery  1994; 1996    "for Crohn's disease"  . Appendectomy    . Implantable cardioverter defibrillator implant N/A 06/02/2014    Procedure:  IMPLANTABLE CARDIOVERTER DEFIBRILLATOR IMPLANT;  Surgeon: [name], MD;  Location: [facility] CATH LAB;  Service: Cardiovascular;  Laterality: N/A;    Social History:  reports that he has been smoking Cigarettes.  He has a 26.5 pack-year smoking history. He has never used smokeless tobacco. He reports that he does not drink alcohol or use illicit drugs.  Family History:  Family History  Problem Relation Age of Onset  . Diabetes type II Mother   . CAD Father   . Diabetes type II Brother      Prior to Admission  medications   Medication Sig Start Date End Date Taking? Authorizing Provider  acetaminophen (TYLENOL) 325 MG tablet Take 2 tablets (650 mg total) by mouth every 6 (six) hours as needed for mild pain, moderate pain or fever. 11/23/13  Yes Modena Jansky, MD  atorvastatin (LIPITOR) 10 MG tablet Take 1 tablet (10 mg total) by mouth daily at 6 PM. Patient taking differently: Take 10 mg by mouth daily.  04/18/14  Yes Maryann Mikhail, DO  donepezil (ARICEPT) 5 MG tablet Take 5 mg by mouth at bedtime.   Yes Historical Provider, MD  eszopiclone (LUNESTA) 1 MG TABS tablet Take 1 mg by mouth at bedtime as needed for sleep. for sleep 10/20/14  Yes Historical Provider, MD  latanoprost (XALATAN) 0.005 % ophthalmic solution Place 1 drop into both eyes at bedtime. 06/29/13  Yes Historical Provider, MD  Oxycodone HCl 10 MG TABS Take 10-20 mg by mouth every 4 (four) hours as needed (pain).  08/24/14  Yes Historical Provider, MD  rivaroxaban (XARELTO) 20 MG TABS tablet Take 1 tablet (20 mg total) by mouth daily with supper. 06/23/14  Yes Hosie Poisson, MD  sertraline (ZOLOFT) 50 MG tablet Take 50 mg by mouth daily.  05/18/14  Yes Historical Provider, MD  tamsulosin (FLOMAX) 0.4 MG CAPS capsule Take 1 capsule (0.4 mg total) by mouth daily. Patient taking differently: Take 0.4 mg by mouth daily after breakfast.  04/18/14  Yes Maryann Mikhail, DO  Vitamins A & D (VITAMIN A & D) ointment Apply 1 application topically daily as needed for dry skin.   Yes Historical Provider, MD  aspirin EC 81 MG EC tablet Take 1 tablet (81 mg total) by mouth daily. Patient not taking: Reported on 07/16/2014 04/18/14   Maryann Mikhail, DO  insulin aspart (NOVOLOG) 100 UNIT/ML injection CBG 70 - 120: 0 units CBG 121 - 150: 1 unit CBG 151 - 200: 2 units CBG 201 - 250: 3 units CBG 251 - 300: 5 units CBG 301 - 350: 7 units CBG 351 - 400: 9 units Patient not taking: Reported on 07/16/2014 06/04/14   Hosie Poisson, MD    Physical Exam: Filed Vitals:    10/30/14 2215 10/30/14 2230 10/30/14 2245 10/30/14 2315  BP: 132/65 130/68 140/70 151/71  Pulse: 60 59 60 59  Temp:      TempSrc:      Resp: 10 13 12 14   Height:      Weight:      SpO2: 100% 100% 100% 100%   General: Not in acute distress. Dry mucus and membrane, cachectic.  HEENT:       Eyes: PERRL, EOMI, no scleral icterus.       ENT: No discharge from the ears and nose, no pharynx injection, no tonsillar enlargement.        Neck: No JVD, no bruit, no mass felt. Heme: No neck lymph node enlargement. Cardiac: S1/S2, RRR, No murmurs,  No gallops or rubs. Pulm: No rales, wheezing, rhonchi or rubs. Abd: Soft, nondistended, nontender, no rebound pain, no organomegaly, BS present. Ext: No pitting leg edema bilaterally. 2+DP/PT pulse bilaterally. Musculoskeletal: No joint deformities, No joint redness or warmth, no limitation of ROM in spin. Skin: No rashes.  Neuro: Alert, oriented X3, cranial nerves II-XII grossly intact, symmetric muscle strength 3/5 in all extremities, sensation to light touch intact.  Psych: Patient is not psychotic, no suicidal or hemocidal ideation.  Labs on Admission:  Basic Metabolic Panel:  Recent Labs Lab 10/30/14 2003  NA 138  K 3.8  CL 107  CO2 22  GLUCOSE 168*  BUN 18  CREATININE 1.29*  CALCIUM 8.5*   Liver Function Tests: No results for input(s): AST, ALT, ALKPHOS, BILITOT, PROT, ALBUMIN in the last 168 hours. No results for input(s): LIPASE, AMYLASE in the last 168 hours. No results for input(s): AMMONIA in the last 168 hours. CBC:  Recent Labs Lab 10/30/14 2003  WBC 6.6  HGB 8.6*  HCT 29.4*  MCV 74.2*  PLT 227   Cardiac Enzymes:  Recent Labs Lab 10/30/14 2003  TROPONINI <0.03    BNP (last 3 results) No results for input(s): BNP in the last 8760 hours.  ProBNP (last 3 results) No results for input(s): PROBNP in the last 8760 hours.  CBG: No results for input(s): GLUCAP in the last 168 hours.  Radiological Exams on  Admission: Ct Head Wo Contrast  10/30/2014   CLINICAL DATA:  Acute onset of altered mental status. Initial encounter.  EXAM: CT HEAD WITHOUT CONTRAST  TECHNIQUE: Contiguous axial images were obtained from the base of the skull through the vertex without intravenous contrast.  COMPARISON:  CT of the head performed 07/16/2014  FINDINGS: There is no evidence of acute infarction, mass lesion, or intra- or extra-axial hemorrhage on CT.  Moderately severe cortical volume loss is noted. Underlying cerebellar atrophy is seen. Chronic lacunar infarcts are seen within the basal ganglia and right thalamus. Chronic infarcts are noted within the cerebellar hemispheres bilaterally. Underlying small chronic infarcts are noted near the convexities bilaterally. Scattered periventricular and subcortical white matter change likely reflects small vessel ischemic microangiopathy.  The brainstem and fourth ventricle are within normal limits. No mass effect or midline shift is seen.  There is no evidence of fracture; visualized osseous structures are unremarkable in appearance. The orbits are within normal limits. The paranasal sinuses and mastoid air cells are well-aerated. No significant soft tissue abnormalities are seen.  IMPRESSION: 1. No acute intracranial pathology seen on CT. 2. Moderately severe cortical volume loss and scattered small vessel ischemic microangiopathy. 3. Chronic infarcts within the cerebellar hemispheres bilaterally, and small chronic infarcts near the convexities bilaterally. Chronic lacunar infarcts within the basal ganglia and right thalamus.   Electronically Signed   By: Garald Balding M.D.   On: 10/30/2014 21:21   Dg Chest Port 1 View  10/30/2014   CLINICAL DATA:  74 year old male with altered mental status  EXAM: PORTABLE CHEST - 1 VIEW  COMPARISON:  Radiograph dated 07/27/2014  FINDINGS: Single-view of the chest demonstrates clear lungs. There is no pleural effusion, pneumothorax. The  cardiomediastinal silhouette is within normal limits. Left pectoral AICD device. The osseous structures are grossly unremarkable.  IMPRESSION: No active disease.   Electronically Signed   By: Anner Crete M.D.   On: 10/30/2014 20:42    EKG: Independently reviewed.  Abnormal findings: Mild ST depression in lateral leads.   Assessment/Plan Principal Problem:  Generalized weakness Active Problems:   TOBACCO ABUSE   Essential hypertension, benign   Chronic systolic heart failure   History of embolic stroke   DVT (deep venous thrombosis)   HLD (hyperlipidemia)   Crohn's disease   Dehydration   DM2 (diabetes mellitus, type 2)   Hx pulmonary embolism   CKD (chronic kidney disease) stage 3, GFR 30-59 ml/min   Hyperlipidemia   Coronary artery disease involving native coronary artery of native heart without angina pectoris   Vascular dementia   Depression due to dementia   Mental status change   Protein-calorie malnutrition, severe   Increased urinary frequency  Generalized weakness: It is most likely due to deconditioning secondary to multiple comorbidities. Possible UTI may have made this worse.  -will admit to tele bed given sCHF -treat possible UTI as below -PT/OT -consult to SW for SNF placement. His wife states that she can no long take care of him at home. Wants SNF placement, at least for short rehab.  Possible UTI: Patient has an increased urinary frequency. His urinalysis showed small amount of leukocyte and microscopic hematuria, indicating possible UTI. He is not septic on admission. He initial blood pressure was soft, which improved to 117/60 after 500 mL normal saline bolus. Hemodynamically stable currently. -  will start IV Rocephin - Follow up results of urine and blood cx and amend antibiotic regimen if needed per sensitivity results - prn Zofran for nausea - IVF: 500 cc of NS bolus and 75 cc/h for 6h (EF 20-25%, limiting aggressive IV fluids treatment).  Tobacco  abuse and Alcohol abuse: -Did counseling about importance of quitting smoking -Nicotine patch  Essential hypertension, benign: not on blood pressure medications, but on Flomax for BPH. Blood pressure is soft on admission. -Blood pressure closely  Systolic congestive heart failure: 2-D echo on 05/31/14 showed EF 20-25%. Patient is not on diuretics at home. He is clinically dry, no any leg edema. Stopped taking ASA since April. Patient is on Xarelto, will not start ASA -Check BNP  Hx of DVT and PE: On Xarelto. No bleeding tendency - continue Xarelto  History of embolic stroke: -On Xarelto and lipitor  HLD: Last LDL was 80 on 04/16/14 -Continue home medications: Lipitor  DM-II: Last A1c 6.5 on 05/30/14, well controled. Patient is taking SSI at home -SSI  CKD (chronic kidney disease) stage 3:  Baseline creatinine 1.1-1.4. His creatinine is 1.29, which is close to baseline. -Follow-up renal function by BMP  Vascular dementia: stable -Donepezil  BPH: stable - Continue Flomax  Depression: Stable, no suicidal or homicidal ideations. -Continue home medications: Zoloft  Protein-calorie malnutrition, severe: -Ensure  DVT ppx: on Xarelto Code Status: DNR Family Communication: Yes, patient's wife and daughter at bed side Disposition Plan: Admit to inpatient   Date of Service 10/30/2014    Ivor Costa [facility] Pager [phone]  If 7PM-7AM, please contact night-coverage [email or web address] Password [facility] 10/30/2014, 11:34 PM

## 2014-10-30 NOTE — Assessment & Plan Note (Signed)
Last EF was 25%; pt will be continued on no meds

## 2014-10-30 NOTE — ED Notes (Signed)
Patient presents with wife stating she noticed he was weaker today then usual.  Wife stated he could not walk to the bathroom.  Was with Hospice care and they released him on Friday.  Today he noticed he was weaker than usual

## 2014-10-31 DIAGNOSIS — R531 Weakness: Secondary | ICD-10-CM

## 2014-10-31 LAB — GLUCOSE, CAPILLARY
GLUCOSE-CAPILLARY: 96 mg/dL (ref 65–99)
Glucose-Capillary: 138 mg/dL — ABNORMAL HIGH (ref 65–99)
Glucose-Capillary: 149 mg/dL — ABNORMAL HIGH (ref 65–99)
Glucose-Capillary: 70 mg/dL (ref 65–99)
Glucose-Capillary: 95 mg/dL (ref 65–99)

## 2014-10-31 LAB — BRAIN NATRIURETIC PEPTIDE: B NATRIURETIC PEPTIDE 5: 42 pg/mL (ref 0.0–100.0)

## 2014-10-31 LAB — TSH: TSH: 0.559 u[IU]/mL (ref 0.350–4.500)

## 2014-10-31 LAB — PROTIME-INR
INR: 1.22 (ref 0.00–1.49)
PROTHROMBIN TIME: 15.6 s — AB (ref 11.6–15.2)

## 2014-10-31 LAB — VITAMIN B12: Vitamin B-12: 447 pg/mL (ref 180–914)

## 2014-10-31 MED ORDER — VITAMINS A & D EX OINT: 1.0000 "application " | TOPICAL_OINTMENT | Freq: Every day | CUTANEOUS | Status: DC | PRN

## 2014-10-31 MED ORDER — OXYCODONE HCL 5 MG PO TABS
10.0000 mg | ORAL_TABLET | ORAL | Status: DC | PRN
  Administered 2014-10-31: 10 mg via ORAL
  Filled 2014-10-31: qty 2

## 2014-10-31 MED ORDER — SODIUM CHLORIDE 0.9 % IV SOLN
INTRAVENOUS | Status: AC
  Administered 2014-10-31: via INTRAVENOUS

## 2014-10-31 MED ORDER — SERTRALINE HCL 50 MG PO TABS
50.0000 mg | ORAL_TABLET | Freq: Every day | ORAL | Status: DC
  Administered 2014-10-31 – 2014-11-01 (×2): 50 mg via ORAL
  Filled 2014-10-31 (×2): qty 1

## 2014-10-31 MED ORDER — LATANOPROST 0.005 % OP SOLN
1.0000 [drp] | Freq: Every day | OPHTHALMIC | Status: DC
  Administered 2014-10-31 (×2): 1 [drp] via OPHTHALMIC
  Filled 2014-10-31: qty 2.5

## 2014-10-31 MED ORDER — NICOTINE 21 MG/24HR TD PT24
21.0000 mg | MEDICATED_PATCH | Freq: Every day | TRANSDERMAL | Status: DC
  Administered 2014-11-01: 21 mg via TRANSDERMAL
  Filled 2014-10-31 (×2): qty 1

## 2014-10-31 MED ORDER — ONDANSETRON HCL 4 MG PO TABS
4.0000 mg | ORAL_TABLET | Freq: Four times a day (QID) | ORAL | Status: DC | PRN
Start: 2014-10-31 — End: 2014-11-01

## 2014-10-31 MED ORDER — DONEPEZIL HCL 5 MG PO TABS
5.0000 mg | ORAL_TABLET | Freq: Every day | ORAL | Status: DC
  Administered 2014-10-31 (×2): 5 mg via ORAL
  Filled 2014-10-31 (×3): qty 1

## 2014-10-31 MED ORDER — ENSURE ENLIVE PO LIQD
237.0000 mL | Freq: Two times a day (BID) | ORAL | Status: DC
  Administered 2014-10-31 – 2014-11-01 (×4): 237 mL via ORAL

## 2014-10-31 MED ORDER — ACETAMINOPHEN 325 MG PO TABS: 650.0000 mg | ORAL_TABLET | Freq: Four times a day (QID) | ORAL | Status: DC | PRN

## 2014-10-31 MED ORDER — ONDANSETRON HCL 4 MG/2ML IJ SOLN: 4.0000 mg | Freq: Four times a day (QID) | INTRAMUSCULAR | Status: DC | PRN

## 2014-10-31 MED ORDER — CEFTRIAXONE SODIUM IN DEXTROSE 20 MG/ML IV SOLN
1.0000 g | INTRAVENOUS | Status: DC
  Administered 2014-10-31 – 2014-11-01 (×2): 1 g via INTRAVENOUS
  Filled 2014-10-31 (×3): qty 50

## 2014-10-31 MED ORDER — RIVAROXABAN 20 MG PO TABS
20.0000 mg | ORAL_TABLET | Freq: Every day | ORAL | Status: DC
  Administered 2014-10-31 (×2): 20 mg via ORAL
  Filled 2014-10-31 (×3): qty 1

## 2014-10-31 MED ORDER — ZOLPIDEM TARTRATE 5 MG PO TABS: 5.0000 mg | ORAL_TABLET | Freq: Every evening | ORAL | Status: DC | PRN

## 2014-10-31 MED ORDER — INSULIN ASPART 100 UNIT/ML ~~LOC~~ SOLN
0.0000 [IU] | Freq: Three times a day (TID) | SUBCUTANEOUS | Status: DC
  Administered 2014-10-31: 1 [IU] via SUBCUTANEOUS
  Administered 2014-11-01: 2 [IU] via SUBCUTANEOUS

## 2014-10-31 MED ORDER — TAMSULOSIN HCL 0.4 MG PO CAPS
0.4000 mg | ORAL_CAPSULE | Freq: Every day | ORAL | Status: DC
  Administered 2014-10-31 – 2014-11-01 (×2): 0.4 mg via ORAL
  Filled 2014-10-31 (×2): qty 1

## 2014-10-31 MED ORDER — ATORVASTATIN CALCIUM 10 MG PO TABS
10.0000 mg | ORAL_TABLET | Freq: Every day | ORAL | Status: DC
  Administered 2014-10-31 – 2014-11-01 (×2): 10 mg via ORAL
  Filled 2014-10-31 (×2): qty 1

## 2014-10-31 NOTE — Evaluation (Signed)
Physical Therapy Evaluation Patient Details Name: Grant Lara MRN: [id number] DOB: August 09, 1940 Today's Date: 10/31/2014   History of Present Illness  Grant Lara is a 74 y.o. male with PMH of dementia, hypertension, hyperlipidemia, diabetes mellitus, depression, CAD, DVT, PE on Xarelto, PFO, chronic disease, stroke, BPH, systolic congestive heart failure (EF 20-25%), who presents with generalized weakness and increased urinary frequency.  Clinical Impression  Pt admitted with above diagnosis. Pt currently with functional limitations due to the deficits listed below (see PT Problem List).  Pt will benefit from skilled PT to increase their independence and safety with mobility to allow discharge to the venue listed below.       Follow Up Recommendations SNF;Supervision/Assistance - 24 hour    Equipment Recommendations  Rolling walker with 5" wheels;3in1 (PT)    Recommendations for Other Services OT consult     Precautions / Restrictions Precautions Precautions: Fall Restrictions Weight Bearing Restrictions: No      Mobility  Bed Mobility Overal bed mobility: Needs Assistance Bed Mobility: Supine to Sit     Supine to sit: Mod assist     General bed mobility comments: Heavy mod assist to come to sit; Hand held assist to pull to sit; assist to clear LEs from EOB  Transfers Overall transfer level: Needs assistance Equipment used: Rolling walker (2 wheeled) Transfers: Sit to/from Stand Sit to Stand: Mod assist         General transfer comment: Heavy mod assist to stand due to generalized weakness, LE pain, and significant L and posterior lean  Ambulation/Gait Ambulation/Gait assistance: +2 physical assistance;Mod assist Ambulation Distance (Feet):  (pivotal steps bed to chair) Assistive device: Rolling walker (2 wheeled) Gait Pattern/deviations: Shuffle     General Gait Details: 2 person assist to safely take small steps from bed to recliner; Minimal weight bearing  RLE; knees, hips, and trunk flexed  Stairs            Wheelchair Mobility    Modified Rankin (Stroke Patients Only)       Balance Overall balance assessment: Needs assistance Sitting-balance support: Bilateral upper extremity supported Sitting balance-Leahy Scale: Poor Sitting balance - Comments: Drifts into Left and posterior lean     Standing balance-Leahy Scale: Zero                               Pertinent Vitals/Pain Pain Assessment: Faces Faces Pain Scale: Hurts whole lot Pain Location: Pt states "both legs"; tender to palpation bil heels; states pain in whole foot; in standing, pt is very hesitant to put weight on R foot compared to left; Pain seems distal to knees Pain Descriptors / Indicators: Grimacing Pain Intervention(s): Limited activity within patient's tolerance;Monitored during session;Repositioned    Home Living Family/patient expects to be discharged to:: Private residence Living Arrangements: Spouse/significant other Available Help at Discharge: Family Type of Home: House Home Access: Stairs to enter Entrance Stairs-Rails: None Technical brewer of Steps: several Home Layout: One level Home Equipment: Environmental consultant - 2 wheels Additional Comments: pt with expressive difficulties and no family present; the above information was taken from an OT note from 5 months ago    Prior Function           Comments: Difficult to understand pt to discern PLOF; per previous admissions, he needed some assist with bathing; Given in H&P that one of the main reasons for coming in was that his wife had difficulty getting him  out of bed, it stands to reason that he is not at baseline     Hand Dominance   Dominant Hand: Right    Extremity/Trunk Assessment   Upper Extremity Assessment: Generalized weakness           Lower Extremity Assessment: Generalized weakness;RLE deficits/detail;LLE deficits/detail RLE Deficits / Details: Very hesitant to  put any weight on R foot; noted pt winced when assisted with stepping R foot, and very tender to palpation R heel  LLE Deficits / Details: Takes more weight on L side when standing     Communication   Communication: Expressive difficulties  Cognition Arousal/Alertness: Awake/alert Behavior During Therapy: WFL for tasks assessed/performed Overall Cognitive Status: No family/caregiver present to determine baseline cognitive functioning       Memory: Decreased short-term memory (with history of dementia)              General Comments General comments (skin integrity, edema, etc.): Mickel Baas, Nurse Tech arrived in room and assisted with getting to the chair; pt was able to use the urinal with some help while sitting in chair    Exercises        Assessment/Plan    PT Assessment Patient needs continued PT services  PT Diagnosis Difficulty walking;Generalized weakness;Acute pain   PT Problem List Decreased strength;Decreased range of motion;Decreased activity tolerance;Decreased balance;Decreased mobility;Decreased coordination;Decreased cognition;Decreased knowledge of use of DME;Decreased safety awareness;Decreased knowledge of precautions;Pain  PT Treatment Interventions DME instruction;Gait training;Stair training;Functional mobility training;Therapeutic activities;Therapeutic exercise;Balance training;Cognitive remediation;Patient/family education   PT Goals (Current goals can be found in the Care Plan section) Acute Rehab PT Goals Patient Stated Goal: Able to state he wants/needs to use the urinal PT Goal Formulation: Patient unable to participate in goal setting Time For Goal Achievement: 11/14/14 Potential to Achieve Goals: Good    Frequency Min 3X/week   Barriers to discharge Decreased caregiver support Wife is having difficulty managing pt safely at home    Co-evaluation               End of Session Equipment Utilized During Treatment: Gait belt Activity  Tolerance: Patient tolerated treatment well Patient left: in chair;with call bell/phone within reach;with chair alarm set;with nursing/sitter in room Nurse Communication: Mobility status         Time: 6438-3818 PT Time Calculation (min) (ACUTE ONLY): 22 min   Charges:   PT Evaluation $Initial PT Evaluation Tier I: 1 Procedure     PT G CodesQuin Hoop 10/31/2014, 1:34 PM  Roney Marion, [place] Pager [phone] Office [phone]

## 2014-10-31 NOTE — Clinical Social Work Note (Signed)
Talked with patient's wife by phone and completed assessment (full assessment to follow). She is agreeable to ST rehab for patient.  CSW initiated facility search process.   Favio Moder Givens, MSW, LCSW Licensed Clinical Social Worker [place] [phone]

## 2014-10-31 NOTE — Progress Notes (Signed)
PT Cancellation Note  Patient Details Name: Grant Lara MRN: [id number] DOB: 25-Feb-1941   Cancelled Treatment:    Reason Eval/Treat Not Completed: Other (comment)    Orders received, chart reviewed;   Pt's current activity order is for strict bedrest;   Will need increased activity orders in order to proceed with PT evaluation;  Thank you,  Roney Marion, PT  Acute Rehabilitation Services Pager [phone] Office [phone]    Roney Marion [facility] 10/31/2014, 10:42 AM

## 2014-10-31 NOTE — Progress Notes (Signed)
New Admission Note:  Arrival Method: Via stretcher  Mental Orientation: A&OX2 Telemetry: On box 7, CCMD notified Assessment: initiated Skin:dry and intact, old scar present on sacral area, foam applied IV: left forearm, normal saline infusing @  75 ml/hr Pain:denies pain Tubes: n/a Admission: initiated [place] Orientation: Patient has been orientated to the room, unit and the staff. Family: None at bedside  Orders have been reviewed and implemented. Will continue to monitor the patient. Call light has been placed within reach and bed alarm has been activated.   Leandro Reasoner BSN, RN  Phone Number: [phone] [place] [facility]

## 2014-10-31 NOTE — Progress Notes (Signed)
[facility] PROGRESS NOTE  Diane Mochizuki Duca KPQ:[id number] DOB: Jul 10, 1940 DOA: 10/30/2014 PCP: [name], MD  Assessment/Plan: Failure to Thrive/Generalized Weakness -Check TSH, B-12. -Doubt UTI, will continue antibiotics pending culture data. -Seen by physical therapy with recommendations for SNF versus home with 24-hour supervision. -Discussed with the wife [name] via phone, she is unable to currently meet his demands at home.  Chronic systolic CHF -Known ejection fraction of 20-25%, compensated.  History of pulmonary embolism -Continue anticoagulation with xarelto.  Questionable UTI -Does not meet clinical criteria, however will continue antibiotics pending culture data.  Chronic kidney disease stage III -Baseline creatinine of 1.1-1.4, currently at baseline.  Severe protein caloric malnutrition -Request dietitian input.  Dementia -Continue Aricept  Type 2 diabetes -Well-controlled, continue current management  Code Status: DO NOT RESUSCITATE Family Communication: Discussed with wife [name] via phone  Disposition Plan: Discharge to SNF once bed available   Consultants:  None   Antibiotics:  Rocephin   Subjective: No issues, seems confused  Objective: Filed Vitals:   10/30/14 2315 10/31/14 0011 10/31/14 0549 10/31/14 0832  BP: 151/71 139/64 112/51 148/63  Pulse: 59 61 59 60  Temp:  98 F (36.7 C) 98.1 F (36.7 C) 98.2 F (36.8 C)  TempSrc:  Oral Oral Oral  Resp: 14 18 18 17   Height:  5' 9"  (1.753 m)    Weight:  73.074 kg (161 lb 1.6 oz)    SpO2: 100% 100% 100% 100%    Intake/Output Summary (Last 24 hours) at 10/31/14 1528 Last data filed at 10/31/14 1256  Gross per 24 hour  Intake    545 ml  Output    350 ml  Net    195 ml   Filed Weights   10/30/14 1941 10/31/14 0011  Weight: 52.164 kg (115 lb) 73.074 kg (161 lb 1.6 oz)    Exam:   General:  Awake, oriented to person only, cachectic  Cardiovascular: Regular rate  and rhythm  Respiratory: Clear to auscultation bilaterally  Abdomen: Soft, nontender, nondistended, positive bowel sounds  Extremities: No clubbing, cyanosis or edema, positive pulses    Neurologic:  Moves all 4 spontaneously  Data Reviewed: Basic Metabolic Panel:  Recent Labs Lab 10/30/14 2003  NA 138  K 3.8  CL 107  CO2 22  GLUCOSE 168*  BUN 18  CREATININE 1.29*  CALCIUM 8.5*   Liver Function Tests: No results for input(s): AST, ALT, ALKPHOS, BILITOT, PROT, ALBUMIN in the last 168 hours. No results for input(s): LIPASE, AMYLASE in the last 168 hours. No results for input(s): AMMONIA in the last 168 hours. CBC:  Recent Labs Lab 10/30/14 2003  WBC 6.6  HGB 8.6*  HCT 29.4*  MCV 74.2*  PLT 227   Cardiac Enzymes:  Recent Labs Lab 10/30/14 2003  TROPONINI <0.03   BNP (last 3 results)  Recent Labs  10/31/14 0140  BNP 42.0    ProBNP (last 3 results) No results for input(s): PROBNP in the last 8760 hours.  CBG:  Recent Labs Lab 10/31/14 0052 10/31/14 0757 10/31/14 1118  GLUCAP 149* 70 138*    Recent Results (from the past 240 hour(s))  Culture, blood (routine x 2)     Status: None (Preliminary result)   Collection Time: 10/31/14  1:25 AM  Result Value Ref Range Status   Specimen Description BLOOD RIGHT ANTECUBITAL  Final   Special Requests IN PEDIATRIC BOTTLE 1.5CC  Final   Culture PENDING  Incomplete   Report Status PENDING  Incomplete     Studies: Ct Head Wo Contrast  10/30/2014   CLINICAL DATA:  Acute onset of altered mental status. Initial encounter.  EXAM: CT HEAD WITHOUT CONTRAST  TECHNIQUE: Contiguous axial images were obtained from the base of the skull through the vertex without intravenous contrast.  COMPARISON:  CT of the head performed 07/16/2014  FINDINGS: There is no evidence of acute infarction, mass lesion, or intra- or extra-axial hemorrhage on CT.  Moderately severe cortical volume loss is noted. Underlying cerebellar atrophy  is seen. Chronic lacunar infarcts are seen within the basal ganglia and right thalamus. Chronic infarcts are noted within the cerebellar hemispheres bilaterally. Underlying small chronic infarcts are noted near the convexities bilaterally. Scattered periventricular and subcortical white matter change likely reflects small vessel ischemic microangiopathy.  The brainstem and fourth ventricle are within normal limits. No mass effect or midline shift is seen.  There is no evidence of fracture; visualized osseous structures are unremarkable in appearance. The orbits are within normal limits. The paranasal sinuses and mastoid air cells are well-aerated. No significant soft tissue abnormalities are seen.  IMPRESSION: 1. No acute intracranial pathology seen on CT. 2. Moderately severe cortical volume loss and scattered small vessel ischemic microangiopathy. 3. Chronic infarcts within the cerebellar hemispheres bilaterally, and small chronic infarcts near the convexities bilaterally. Chronic lacunar infarcts within the basal ganglia and right thalamus.   Electronically Signed   By: Garald Balding M.D.   On: 10/30/2014 21:21   Dg Chest Port 1 View  10/30/2014   CLINICAL DATA:  74 year old male with altered mental status  EXAM: PORTABLE CHEST - 1 VIEW  COMPARISON:  Radiograph dated 07/27/2014  FINDINGS: Single-view of the chest demonstrates clear lungs. There is no pleural effusion, pneumothorax. The cardiomediastinal silhouette is within normal limits. Left pectoral AICD device. The osseous structures are grossly unremarkable.  IMPRESSION: No active disease.   Electronically Signed   By: Anner Crete M.D.   On: 10/30/2014 20:42    Scheduled Meds: . atorvastatin  10 mg Oral Daily  . cefTRIAXone (ROCEPHIN)  IV  1 g Intravenous Q24H  . donepezil  5 mg Oral QHS  . feeding supplement (ENSURE ENLIVE)  237 mL Oral BID BM  . insulin aspart  0-9 Units Subcutaneous TID WC  . latanoprost  1 drop Both Eyes QHS  .  nicotine  21 mg Transdermal Daily  . rivaroxaban  20 mg Oral Q supper  . sertraline  50 mg Oral Daily  . tamsulosin  0.4 mg Oral QPC breakfast   Continuous Infusions:   Principal Problem:   Generalized weakness Active Problems:   TOBACCO ABUSE   Essential hypertension, benign   Chronic systolic heart failure   History of embolic stroke   DVT (deep venous thrombosis)   HLD (hyperlipidemia)   Crohn's disease   Dehydration   DM2 (diabetes mellitus, type 2)   Hx pulmonary embolism   CKD (chronic kidney disease) stage 3, GFR 30-59 ml/min   Hyperlipidemia   Coronary artery disease involving native coronary artery of native heart without angina pectoris   Vascular dementia   Depression due to dementia   Mental status change   Protein-calorie malnutrition, severe   Increased urinary frequency    Time spent: 30 minutes. Greater than 50% of this time was spent in direct contact with the patient coordinating care.    Lelon Frohlich  [facility] Pager [phone]  If 7PM-7AM, please contact night-coverage at [email or web address], password [facility] 10/31/2014, 3:28 PM  LOS: 1 day

## 2014-11-01 DIAGNOSIS — N183 Chronic kidney disease, stage 3 (moderate): Secondary | ICD-10-CM

## 2014-11-01 LAB — GLUCOSE, CAPILLARY
GLUCOSE-CAPILLARY: 180 mg/dL — AB (ref 65–99)
Glucose-Capillary: 84 mg/dL (ref 65–99)

## 2014-11-01 LAB — URINE CULTURE

## 2014-11-01 MED ORDER — OXYCODONE HCL 10 MG PO TABS: 10.0000 mg | ORAL_TABLET | ORAL | Status: DC | PRN

## 2014-11-01 MED ORDER — CIPROFLOXACIN HCL 250 MG PO TABS: 250.0000 mg | ORAL_TABLET | Freq: Two times a day (BID) | ORAL | Status: DC

## 2014-11-01 NOTE — Progress Notes (Signed)
Utilization review completed. Emaline Karnes, RN, BSN. 

## 2014-11-01 NOTE — Progress Notes (Signed)
OT Cancellation Note  Patient Details Name: Grant Lara MRN: [id number] DOB: 1941/03/18   Cancelled Treatment:    Reason Eval/Treat Not Completed: Other (comment) Screened. Pt's current D/C plan is SNF (leaving today). No apparent immediate acute care OT needs, therefore will defer OT to SNF. If OT eval is needed please call Acute Rehab Dept. at [phone] or text page OT at [phone].    Benito Mccreedy OTR/L [phone] 11/01/2014, 3:02 PM

## 2014-11-01 NOTE — Care Management Note (Signed)
Case Management Note  Patient Details  Name: Grant Lara MRN: [id number] Date of Birth: October 30, 1940  Subjective/Objective:          CM following for progression and d/c planning.          Action/Plan: CSW, Shelle Iron working with pt and wife re SNF placement. No CM needs.   Expected Discharge Date:       11/01/2014           Expected Discharge Plan:  Skilled Nursing Facility  In-House Referral:  Clinical Social Work  Discharge planning Services  NA  Post Acute Care Choice:  NA Choice offered to:  NA  DME Arranged:    DME Agency:     HH Arranged:    [place] Agency:     Status of Service:  Completed, signed off  Medicare Important Message Given:    Date Medicare IM Given:    Medicare IM give by:    Date Additional Medicare IM Given:    Additional Medicare Important Message give by:     If discussed at [place] of Stay Meetings, dates discussed:    Additional Comments:  Hancel, Ion, RN 11/01/2014, 10:47 AM

## 2014-11-01 NOTE — Care Management Important Message (Signed)
Important Message  Patient Details  Name: Grant Lara MRN: [id number] Date of Birth: 03-Mar-1941   Medicare Important Message Given:  [facility] notification given    Nathen May 11/01/2014, 12:20 [place] Message  Patient Details  Name: Grant Lara MRN: [id number] Date of Birth: 1941-03-18   Medicare Important Message Given:  Yes-second notification given    Nathen May 11/01/2014, 12:20 PM

## 2014-11-01 NOTE — Clinical Social Work Note (Addendum)
Clinical Social Work Assessment  Patient Details  Name: Grant Lara MRN: [id number] Date of Birth: January 28, 1941  Date of referral:  10/31/14               Reason for consult:  Facility Placement                Permission sought to share information with:  Family Supports (CSW talked with wife, Duel Conrad as patient oriented to self and place) Permission granted to share information::  No (Did not talk with husband due to orientation status)  Name::     [facility]  Agency::     Relationship::  Wife  Contact Information:  [phone] and [phone] - home  Housing/Transportation Living arrangements for the past 2 months:  Single Family Home Source of Information:  Spouse Patient Interpreter Needed:  None Criminal Activity/Legal Involvement Pertinent to Current Situation/Hospitalization:  No - Comment as needed Significant Relationships:    Lives with:  Spouse Do you feel safe going back to the place where you live?  Yes Need for family participation in patient care:  Yes (Comment)  Care giving concerns:  Patient's wife is primary caregiver and prior to admission, Mrs. Flink was unable to get patient out of bed.   Social Worker assessment / plan:  On 7/18 CSW talked with Mrs. Smouse by phone regarding discharge planning and recommendation of ST rehab. Mrs. Kyler is in agreement and reported that her husband has been to [facility]. Her preference is [facility] as it will be more convenient for her.  CSW informed wife that search will be initiated and she will be given facility responses on Tuesday, 7/19.  Employment status:  Retired Nurse, adult PT Recommendations:  [place] / Referral to community resources:  Other (Comment Required) (None needed or requested at this time)  Patient/Family's Response to care:  No concerns expressed by spouse.  Patient/Family's Understanding of and  Emotional Response to Diagnosis, Current Treatment, and Prognosis:  Not discussed  Emotional Assessment Appearance:  Appears stated age Attitude/Demeanor/Rapport:  Unable to Assess (Talked with patient's wife by phone) Affect (typically observed):  Unable to Assess Orientation:  Oriented to Self, Oriented to Place Alcohol / Substance use:  Tobacco Use (Patient reported that he has been smoking cigarettes. He does not drink alcohol or use illicit drugs.) Psych involvement (Current and /or in the community):  No (Comment)  Discharge Needs  Concerns to be addressed:  Discharge Planning Concerns (Before coming to hospital, wife reported that she was unable to et patient out of the bed.) Readmission within the last 30 days:  No Current discharge risk:  None Barriers to Discharge:  No Barriers Identified   Sable Feil, LCSW 11/01/2014, 12:36 PM

## 2014-11-01 NOTE — Clinical Social Work Placement (Signed)
   CLINICAL SOCIAL WORK PLACEMENT  NOTE  Date:  11/01/2014  Patient Details  Name: Grant Lara MRN: [id number] Date of Birth: [birth year]  Clinical Social Work is seeking post-discharge placement for this patient at the [place] level of care (*CSW will initial, date and re-position this form in  chart as items are completed):  No (Wife had preferences for facility placement)   Patient/family provided with [place] Work Department's list of facilities offering this level of care within the geographic area requested by the patient (or if unable, by the patient's family).  Yes   Patient/family informed of their freedom to choose among providers that offer the needed level of care, that participate in Medicare, Medicaid or managed care program needed by the patient, have an available bed and are willing to accept the patient.  Yes   Patient/family informed of [facility] [place]'s ownership interest in [facility] and [facility], as well as of the fact that they are under no obligation to receive care at these facilities.  PASRR submitted to EDS on       PASRR number received on       Existing PASRR number confirmed on 10/31/14     FL2 transmitted to all facilities in geographic area requested by pt/family on 10/31/14     FL2 transmitted to all facilities within larger geographic area on       Patient informed that his/her managed care company has contracts with or will negotiate with certain facilities, including the following:        Yes (Offers given on 11/01/14)   Patient/family informed of bed offers received.  Patient chooses bed at [facility] recommends and patient chooses bed at      Patient to be transferred to [facility] on 11/01/14.  Patient to be transferred to facility by Ambulance [facility])     Patient family notified on 11/01/14 of transfer.  Name of family member notified:  Wife, Jensyn Cambria notified by phone.  PHYSICIAN       Additional Comment:    _______________________________________________ Sable Feil, LCSW 11/01/2014, 12:47 PM

## 2014-11-01 NOTE — Progress Notes (Signed)
Pt discharging to [facility]. Report given to nurse at facility. IV dc'd, Telemetry dc'd. Vitals stable for pt. Family informed of discharge plan. 11/01/2014 3:02 PM Maanasa Aderhold

## 2014-11-01 NOTE — Discharge Summary (Signed)
Physician Discharge Summary  Rolly Magri Neglia MAU:[id number] DOB: [birth year] DOA: 10/30/2014  PCP: Grant Austria, MD  Admit date: 10/30/2014 Discharge date: 11/01/2014  Time spent: 45 minutes  Recommendations for Outpatient Follow-up:  -Will be discharged to skilled nursing facility today. -Please follow results of urine culture.   Discharge Diagnoses:  Principal Problem:   Generalized weakness Active Problems:   TOBACCO ABUSE   Essential hypertension, benign   Chronic systolic heart failure   History of embolic stroke   DVT (deep venous thrombosis)   HLD (hyperlipidemia)   Crohn's disease   Dehydration   DM2 (diabetes mellitus, type 2)   Hx pulmonary embolism   CKD (chronic kidney disease) stage 3, GFR 30-59 ml/min   Hyperlipidemia   Coronary artery disease involving native coronary artery of native heart without angina pectoris   Vascular dementia   Depression due to dementia   Mental status change   Protein-calorie malnutrition, severe   Increased urinary frequency   Discharge Condition: Stable and improved  Filed Weights   10/30/14 1941 10/31/14 0011 10/31/14 2108  Weight: 52.164 kg (115 lb) 73.074 kg (161 lb 1.6 oz) 54.432 kg (120 lb)    History of present illness:  Grant Lara is a 74 y.o. male with PMH of dementia, hypertension, hyperlipidemia, diabetes mellitus, depression, CAD, DVT, PE on Xarelto, PFO, chronic disease, stroke, BPH, systolic congestive heart failure (EF 20-25%), who presents with generalized weakness and increased urinary frequency.  Patient reports that he had been under hospice care and was released from hospice on Friday this week. Since she came home, she has been feeling weak, which has been progressively getting worse. She denies dysuria, burning on urination, but states that he has an increased urinary frequency. Patient does not have chest pain, shortness breath, cough, abdominal pain, diarrhea, unilateral weakness or leg edema.  Patient patient currently lives with his wife. She is his primary caregiver. Wife reports that she does not have any aids or nurses coming to the home to help her. She was unable to get him out of bed today.   In ED, patient was found to have negative troponin, normal temperature, no tachycardia, stable renal function, negative chest x-ray. Urinalysis showed small amount of leukocyte and macroscopic hematuria.  Hospital Course:   Failure to Thrive/Generalized Weakness -TSH, B-12 within normal limits. -Doubt UTI, will continue antibiotics pending culture data. -Seen by physical therapy with recommendations for SNF versus home with 24-hour supervision. -Discussed with the wife [name] via phone, she is unable to currently meet his demands at home. -SNF bed available today.  Chronic systolic CHF -Known ejection fraction of 20-25%, compensated.  History of pulmonary embolism -Continue anticoagulation with xarelto.  Questionable UTI -Does not meet clinical criteria, however will continue antibiotics pending culture data as this is possibly the reason for his acute decompensation. -Cipro for 5 days.  Chronic kidney disease stage III -Baseline creatinine of 1.1-1.4, currently at baseline.  Severe protein caloric malnutrition -Dietary supplements.  Dementia -Continue Aricept  Type 2 diabetes -Well-controlled, continue current management   Procedures:  None   Consultations:  None  Discharge Instructions  Discharge Instructions    Diet - low sodium heart healthy    Complete by:  As directed      Increase activity slowly    Complete by:  As directed             Medication List    STOP taking these medications  aspirin 81 MG EC tablet      TAKE these medications        acetaminophen 325 MG tablet  Commonly known as:  TYLENOL  Take 2 tablets (650 mg total) by mouth every 6 (six) hours as needed for mild pain, moderate pain or fever.     atorvastatin 10 MG  tablet  Commonly known as:  LIPITOR  Take 1 tablet (10 mg total) by mouth daily at 6 PM.     ciprofloxacin 250 MG tablet  Commonly known as:  CIPRO  Take 1 tablet (250 mg total) by mouth 2 (two) times daily.     donepezil 5 MG tablet  Commonly known as:  ARICEPT  Take 5 mg by mouth at bedtime.     eszopiclone 1 MG Tabs tablet  Commonly known as:  LUNESTA  Take 1 mg by mouth at bedtime as needed for sleep. for sleep     insulin aspart 100 UNIT/ML injection  Commonly known as:  novoLOG  CBG 70 - 120: 0 units CBG 121 - 150: 1 unit CBG 151 - 200: 2 units CBG 201 - 250: 3 units CBG 251 - 300: 5 units CBG 301 - 350: 7 units CBG 351 - 400: 9 units     latanoprost 0.005 % ophthalmic solution  Commonly known as:  XALATAN  Place 1 drop into both eyes at bedtime.     Oxycodone HCl 10 MG Tabs  Take 1-2 tablets (10-20 mg total) by mouth every 4 (four) hours as needed (pain).     rivaroxaban 20 MG Tabs tablet  Commonly known as:  XARELTO  Take 1 tablet (20 mg total) by mouth daily with supper.     sertraline 50 MG tablet  Commonly known as:  ZOLOFT  Take 50 mg by mouth daily.     tamsulosin 0.4 MG Caps capsule  Commonly known as:  FLOMAX  Take 1 capsule (0.4 mg total) by mouth daily.     vitamin A & D ointment  Apply 1 application topically daily as needed for dry skin.       No Known Allergies    The results of significant diagnostics from this hospitalization (including imaging, microbiology, ancillary and laboratory) are listed below for reference.    Significant Diagnostic Studies: Ct Head Wo Contrast  10/30/2014   CLINICAL DATA:  Acute onset of altered mental status. Initial encounter.  EXAM: CT HEAD WITHOUT CONTRAST  TECHNIQUE: Contiguous axial images were obtained from the base of the skull through the vertex without intravenous contrast.  COMPARISON:  CT of the head performed 07/16/2014  FINDINGS: There is no evidence of acute infarction, mass lesion, or intra- or  extra-axial hemorrhage on CT.  Moderately severe cortical volume loss is noted. Underlying cerebellar atrophy is seen. Chronic lacunar infarcts are seen within the basal ganglia and right thalamus. Chronic infarcts are noted within the cerebellar hemispheres bilaterally. Underlying small chronic infarcts are noted near the convexities bilaterally. Scattered periventricular and subcortical white matter change likely reflects small vessel ischemic microangiopathy.  The brainstem and fourth ventricle are within normal limits. No mass effect or midline shift is seen.  There is no evidence of fracture; visualized osseous structures are unremarkable in appearance. The orbits are within normal limits. The paranasal sinuses and mastoid air cells are well-aerated. No significant soft tissue abnormalities are seen.  IMPRESSION: 1. No acute intracranial pathology seen on CT. 2. Moderately severe cortical volume loss and scattered small vessel ischemic microangiopathy.  3. Chronic infarcts within the cerebellar hemispheres bilaterally, and small chronic infarcts near the convexities bilaterally. Chronic lacunar infarcts within the basal ganglia and right thalamus.   Electronically Signed   By: Garald Balding M.D.   On: 10/30/2014 21:21   Dg Chest Port 1 View  10/30/2014   CLINICAL DATA:  74 year old male with altered mental status  EXAM: PORTABLE CHEST - 1 VIEW  COMPARISON:  Radiograph dated 07/27/2014  FINDINGS: Single-view of the chest demonstrates clear lungs. There is no pleural effusion, pneumothorax. The cardiomediastinal silhouette is within normal limits. Left pectoral AICD device. The osseous structures are grossly unremarkable.  IMPRESSION: No active disease.   Electronically Signed   By: Anner Crete M.D.   On: 10/30/2014 20:42    Microbiology: Recent Results (from the past 240 hour(s))  Culture, blood (routine x 2)     Status: None (Preliminary result)   Collection Time: 10/31/14  1:25 AM  Result Value  Ref Range Status   Specimen Description BLOOD RIGHT ANTECUBITAL  Final   Special Requests IN PEDIATRIC BOTTLE 1.5CC  Final   Culture PENDING  Incomplete   Report Status PENDING  Incomplete     Labs: Basic Metabolic Panel:  Recent Labs Lab 10/30/14 2003  NA 138  K 3.8  CL 107  CO2 22  GLUCOSE 168*  BUN 18  CREATININE 1.29*  CALCIUM 8.5*   Liver Function Tests: No results for input(s): AST, ALT, ALKPHOS, BILITOT, PROT, ALBUMIN in the last 168 hours. No results for input(s): LIPASE, AMYLASE in the last 168 hours. No results for input(s): AMMONIA in the last 168 hours. CBC:  Recent Labs Lab 10/30/14 2003  WBC 6.6  HGB 8.6*  HCT 29.4*  MCV 74.2*  PLT 227   Cardiac Enzymes:  Recent Labs Lab 10/30/14 2003  TROPONINI <0.03   BNP: BNP (last 3 results)  Recent Labs  10/31/14 0140  BNP 42.0    ProBNP (last 3 results) No results for input(s): PROBNP in the last 8760 hours.  CBG:  Recent Labs Lab 10/31/14 0757 10/31/14 1118 10/31/14 1619 10/31/14 2108 11/01/14 0734  GLUCAP 70 138* 95 96 84       Signed:  [facility] Pager: [phone] 11/01/2014, 9:59 AM

## 2014-11-03 ENCOUNTER — Non-Acute Institutional Stay (SKILLED_NURSING_FACILITY): Payer: Medicare Other | Admitting: Internal Medicine

## 2014-11-03 ENCOUNTER — Encounter: Payer: Self-pay | Admitting: Internal Medicine

## 2014-11-03 DIAGNOSIS — E43 Unspecified severe protein-calorie malnutrition: Secondary | ICD-10-CM

## 2014-11-03 DIAGNOSIS — I10 Essential (primary) hypertension: Secondary | ICD-10-CM | POA: Diagnosis not present

## 2014-11-03 DIAGNOSIS — N183 Chronic kidney disease, stage 3 unspecified: Secondary | ICD-10-CM

## 2014-11-03 DIAGNOSIS — I2699 Other pulmonary embolism without acute cor pulmonale: Secondary | ICD-10-CM

## 2014-11-03 DIAGNOSIS — R627 Adult failure to thrive: Secondary | ICD-10-CM | POA: Diagnosis not present

## 2014-11-03 DIAGNOSIS — E785 Hyperlipidemia, unspecified: Secondary | ICD-10-CM

## 2014-11-03 DIAGNOSIS — I5022 Chronic systolic (congestive) heart failure: Secondary | ICD-10-CM

## 2014-11-03 DIAGNOSIS — E119 Type 2 diabetes mellitus without complications: Secondary | ICD-10-CM

## 2014-11-03 DIAGNOSIS — F0150 Vascular dementia without behavioral disturbance: Secondary | ICD-10-CM

## 2014-11-03 NOTE — Progress Notes (Signed)
MRN: [id number] Name: Grant Lara  Sex: male Age: 74 y.o. DOB: 06-Aug-1940  [place] [facility] Facility/Room:216 Level Of Care: SNF Provider: Inocencio Homes D Emergency Contacts: Extended Emergency Contact Information Primary Emergency Contact: Bales,[name] Address: [address]          [place], [place] [address] [name] of [place] Phone: [phone] Mobile Phone: [phone] Relation: Spouse Secondary Emergency Contact: [name],[name]          [place], [place] of [place] Mobile Phone: [phone] Relation: Daughter  Code Status:DNR   Allergies: Review of patient's allergies indicates no known allergies.  Chief Complaint  Patient presents with  . New Admit To SNF    HPI: Patient is 74 y.o. male who is a 74 y.o. male with PMH of dementia, hypertension, hyperlipidemia, diabetes mellitus, depression, CAD, DVT, PE on Xarelto, PFO, chronic disease, stroke, BPH, systolic congestive heart failure (EF 20-25%), who presents with generalized weakness and increased urinary frequency. Pt was hospitalized from 7/17-19 with a neg w/u for acute problems but was treated with cipro for 5 days for a possible UTI. Pt is admitted to SNF for generalized weakness anfd for family's inability to care for him at home. At SNF he will be treated for hyperlipidemia with lipitor, DVT/PE with chronic xarelto and dementia with aricept.   Past Medical History  Diagnosis Date  . Hypertension   . Dilated cardiomyopathy     2/12: EF 30-35%, trivial AI, mild RAE.  EF 2014 40 -45%  . CAD (coronary artery disease)     LHC 9/05 with Dr. Einar Gip:  dLM 20-30%, LAD 85%, oD1 20-30%.  PCI:  Taxus DES to LAD; Dx jailed and tx with POBA.  Last myoview 12/10: inf scar, no ischemia, EF 29%.  . DVT (deep venous thrombosis)   . Pulmonary embolus     chronic coumadin  . Hyperhomocystinemia   . PFO (patent foramen ovale)     Not mentioned on 2014 echo.  [name] HLD (hyperlipidemia)   . Crohn's disease   .  Nephrolithiasis   . Diabetes mellitus     11/05/11 "borderline; don't take medications"  . Stroke 1993    "left arm can't hold steady; leg too"  . Arthritis     "used to have a touch in my legs"  . Memory difficulties   . Pneumonia ~ 2011    09-22-13 denies any recent SOB or breathing problems  . Swelling of both ankles      09-22-13 occ.feet, but denies pain.  . Bradycardia 06/01/2014  . Dementia     Past Surgical History  Procedure Laterality Date  . Colon surgery  1994; 1996    "for Crohn's disease"  . Appendectomy    . Implantable cardioverter defibrillator implant N/A 06/02/2014    Procedure: IMPLANTABLE CARDIOVERTER DEFIBRILLATOR IMPLANT;  Surgeon: [name], MD;  Location: [facility] CATH LAB;  Service: Cardiovascular;  Laterality: N/A;      Medication List       This list is accurate as of: 11/03/14 11:59 PM.  Always use your most recent med list.               acetaminophen 325 MG tablet  Commonly known as:  TYLENOL  Take 2 tablets (650 mg total) by mouth every 6 (six) hours as needed for mild pain, moderate pain or fever.     atorvastatin 10 MG tablet  Commonly known as:  LIPITOR  Take 1 tablet (10 mg total) by mouth  daily at 6 PM.     ciprofloxacin 250 MG tablet  Commonly known as:  CIPRO  Take 1 tablet (250 mg total) by mouth 2 (two) times daily.     donepezil 5 MG tablet  Commonly known as:  ARICEPT  Take 5 mg by mouth at bedtime.     eszopiclone 1 MG Tabs tablet  Commonly known as:  LUNESTA  Take 1 mg by mouth at bedtime as needed for sleep. for sleep     insulin aspart 100 UNIT/ML injection  Commonly known as:  novoLOG  CBG 70 - 120: 0 units CBG 121 - 150: 1 unit CBG 151 - 200: 2 units CBG 201 - 250: 3 units CBG 251 - 300: 5 units CBG 301 - 350: 7 units CBG 351 - 400: 9 units     latanoprost 0.005 % ophthalmic solution  Commonly known as:  XALATAN  Place 1 drop into both eyes at bedtime.     Oxycodone HCl 10 MG Tabs  Take 1-2 tablets (10-20 mg  total) by mouth every 4 (four) hours as needed (pain).     rivaroxaban 20 MG Tabs tablet  Commonly known as:  XARELTO  Take 1 tablet (20 mg total) by mouth daily with supper.     sertraline 50 MG tablet  Commonly known as:  ZOLOFT  Take 50 mg by mouth daily.     tamsulosin 0.4 MG Caps capsule  Commonly known as:  FLOMAX  Take 1 capsule (0.4 mg total) by mouth daily.     vitamin A & D ointment  Apply 1 application topically daily as needed for dry skin.        No orders of the defined types were placed in this encounter.    Immunization History  Administered Date(s) Administered  . Influenza Whole 02/09/2010  . PPD Test 11/05/2011  . Pneumococcal Polysaccharide-23 02/19/2010    History  Substance Use Topics  . Smoking status: Current Some Day Smoker -- 0.50 packs/day for 53 years    Types: Cigarettes  . Smokeless tobacco: Never Used  . Alcohol Use: No    Family history is  + for DM,CAD  Review of Systems  DATA OBTAINED: from patient, nurse; pt has aphasia but can gety most things out with enough time GENERAL:  no fevers, fatigue, appetite changes SKIN: No itching, rash or wounds EYES: No eye pain, redness, discharge EARS: No earache, tinnitus, change in hearing NOSE: No congestion, drainage or bleeding  MOUTH/THROAT: No mouth or tooth pain, No sore throat RESPIRATORY: No cough, wheezing, SOB CARDIAC: No chest pain, palpitations, lower extremity edema  GI: No abdominal pain, No N/V/D or constipation, No heartburn or reflux  GU: No dysuria, frequency or urgency, or incontinence  MUSCULOSKELETAL: No unrelieved bone/joint pain NEUROLOGIC: No headache, dizziness or focal weakness PSYCHIATRIC: No c/o anxiety or sadness   Filed Vitals:   11/03/14 2153  BP: 129/87  Pulse: 67  Temp: 98.2 F (36.8 C)  Resp: 18    SpO2 Readings from Last 1 Encounters:  11/01/14 100%        Physical Exam  GENERAL APPEARANCE: Alert, conversant,  Very pleasant BM,No acute  distress.  SKIN: No diaphoresis rash HEAD: Normocephalic, atraumatic  EYES: Conjunctiva/lids clear. Pupils round, reactive. EOMs intact.  EARS: External exam WNL, canals clear. Hearing grossly normal.  NOSE: No deformity or discharge.  MOUTH/THROAT: Lips w/o lesions  RESPIRATORY: Breathing is even, unlabored. Lung sounds are clear   CARDIOVASCULAR: Heart RRR  no murmurs, rubs or gallops. No peripheral edema.   GASTROINTESTINAL: Abdomen is soft, non-tender, not distended w/ normal bowel sounds. GENITOURINARY: Bladder non tender, not distended  MUSCULOSKELETAL: No abnormal joints or musculature NEUROLOGIC:  Cranial nerves 2-12 grossly intact PSYCHIATRIC: Mood and affect appropriate to situation, no behavioral issues  Patient Active Problem List   Diagnosis Date Noted  . FTT (failure to thrive) in adult 11/05/2014  . Mental status change 10/30/2014  . Protein-calorie malnutrition, severe 10/30/2014  . Increased urinary frequency 10/30/2014  . Faintness   . Vascular dementia 09/01/2014  . Generalized weakness 09/01/2014  . 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 Cardiac syncope   . Other specified hypotension   . Chronic systolic CHF (congestive heart failure)   . Hypotension 07/16/2014  . Hypothermia 07/16/2014  . SIRS (systemic inflammatory response syndrome) 07/16/2014  . Pulmonary embolus 07/16/2014  . Late effects of CVA (cerebrovascular accident) 07/16/2014  . Bradycardia 06/01/2014  . Syncope 05/30/2014  . Stroke   . CVA (cerebral infarction)   . Hyperlipidemia   . Coronary artery disease involving native coronary artery of native heart without angina pectoris   . LVH (left ventricular hypertrophy)   . NSVT (nonsustained ventricular tachycardia) 04/16/2014  . CKD (chronic kidney disease) stage 3, GFR 30-59 ml/min 04/16/2014  . Aphasia 04/15/2014  . Abnormal EKG   . Anemia 11/21/2013  . DM2 (diabetes mellitus, type 2) 11/20/2013  . Hx pulmonary embolism 11/20/2013  .  Leukocytosis 11/20/2013  . Hyperglycemia 08/22/2013  . Dehydration 08/21/2013  . ARF (acute renal failure) 08/21/2013  . Abnormal ECG 08/21/2013  . Delirium 07/18/2013  . Small bowel obstruction 07/13/2013  . Encounter for therapeutic drug monitoring 05/11/2013  . Crohn's disease 02/16/2012  . Abnormal CT scan of lung 11/08/2011  . Bilateral leg and foot pain 11/06/2011  . SBO (small bowel obstruction) 11/05/2011  . HLD (hyperlipidemia) 03/28/2011  . Pulmonary embolism 06/05/2010  . History of embolic stroke 39/76/7341  . DVT (deep venous thrombosis) 06/05/2010  . EMPHYSEMATOUS BLEB 04/06/2010  . C O P D 04/05/2010  . Chronic systolic heart failure 93/79/0240  . Memory loss 04/03/2009  . CHEST PAIN 03/08/2009  . Essential hypertension, benign 02/10/2009  . TOBACCO ABUSE 12/12/2008  . Coronary atherosclerosis 12/12/2008  . CARDIOMYOPATHY 12/12/2008  . ABNORMAL ELECTROCARDIOGRAM 12/12/2008    CBC    Component Value Date/Time   WBC 6.6 10/30/2014 2003   RBC 3.96* 10/30/2014 2003   RBC 3.58* 06/01/2014 1300   HGB 8.6* 10/30/2014 2003   HCT 29.4* 10/30/2014 2003   PLT 227 10/30/2014 2003   MCV 74.2* 10/30/2014 2003   LYMPHSABS 0.9 07/27/2014 1649   MONOABS 0.5 07/27/2014 1649   EOSABS 0.0 07/27/2014 1649   BASOSABS 0.0 07/27/2014 1649    CMP     Component Value Date/Time   NA 138 10/30/2014 2003   K 3.8 10/30/2014 2003   CL 107 10/30/2014 2003   CO2 22 10/30/2014 2003   GLUCOSE 168* 10/30/2014 2003   BUN 18 10/30/2014 2003   CREATININE 1.29* 10/30/2014 2003   CREATININE 1.34 12/13/2011 1722   CALCIUM 8.5* 10/30/2014 2003   PROT 5.3* 07/15/2014 2325   ALBUMIN 2.6* 07/15/2014 2325   AST 28 07/15/2014 2325   ALT 51 07/15/2014 2325   ALKPHOS 92 07/15/2014 2325   BILITOT 0.4 07/15/2014 2325   GFRNONAA 52* 10/30/2014 2003   GFRAA >60 10/30/2014 2003    Lab Results  Component Value Date   HGBA1C  6.5* 05/30/2014     Ct Head Wo Contrast  10/30/2014   CLINICAL  DATA:  Acute onset of altered mental status. Initial encounter.  EXAM: CT HEAD WITHOUT CONTRAST  TECHNIQUE: Contiguous axial images were obtained from the base of the skull through the vertex without intravenous contrast.  COMPARISON:  CT of the head performed 07/16/2014  FINDINGS: There is no evidence of acute infarction, mass lesion, or intra- or extra-axial hemorrhage on CT.  Moderately severe cortical volume loss is noted. Underlying cerebellar atrophy is seen. Chronic lacunar infarcts are seen within the basal ganglia and right thalamus. Chronic infarcts are noted within the cerebellar hemispheres bilaterally. Underlying small chronic infarcts are noted near the convexities bilaterally. Scattered periventricular and subcortical white matter change likely reflects small vessel ischemic microangiopathy.  The brainstem and fourth ventricle are within normal limits. No mass effect or midline shift is seen.  There is no evidence of fracture; visualized osseous structures are unremarkable in appearance. The orbits are within normal limits. The paranasal sinuses and mastoid air cells are well-aerated. No significant soft tissue abnormalities are seen.  IMPRESSION: 1. No acute intracranial pathology seen on CT. 2. Moderately severe cortical volume loss and scattered small vessel ischemic microangiopathy. 3. Chronic infarcts within the cerebellar hemispheres bilaterally, and small chronic infarcts near the convexities bilaterally. Chronic lacunar infarcts within the basal ganglia and right thalamus.   Electronically Signed   By: Garald Balding M.D.   On: 10/30/2014 21:21   Dg Chest Port 1 View  10/30/2014   CLINICAL DATA:  74 year old male with altered mental status  EXAM: PORTABLE CHEST - 1 VIEW  COMPARISON:  Radiograph dated 07/27/2014  FINDINGS: Single-view of the chest demonstrates clear lungs. There is no pleural effusion, pneumothorax. The cardiomediastinal silhouette is within normal limits. Left pectoral AICD  device. The osseous structures are grossly unremarkable.  IMPRESSION: No active disease.   Electronically Signed   By: Anner Crete M.D.   On: 10/30/2014 20:42    Not all labs, radiology exams or other studies done during hospitalization come through on my EPIC note; however they are reviewed by me.    Assessment and Plan  FTT (failure to thrive) in adult TSH, B-12 within normal limits. -Doubt UTI, will continue antibiotics pending culture data. -Seen by physical therapy with recommendations for SNF versus home with 24-hour supervision. -Discussed with the wife [name] via phone, she is unable to currently meet his demands at home. SNF plan - OT/PT  Chronic systolic heart failure Known ejection fraction of 20-25%, compensated.SNF plan - on no specific meds;will monitor for decompensation  Pulmonary embolism SNF plan -Continue anticoagulation with xarelto    Essential hypertension, benign Stable on no meds;SNF plan - daily monitoring  DM2 (diabetes mellitus, type 2) SNF plan - diabetic  Diet, on no meds, not on nACE 2/2 renal dx, is on statin  Vascular dementia SNF plan - continue aricept  CKD (chronic kidney disease) stage 3, GFR 30-59 ml/min SNF plan - BMP to follow BUN/Cr  Protein-calorie malnutrition, severe SNF plan - diet and supplements  Hyperlipidemia SNF Plan - continue lipitor ; if pt is to be resident will need baseline labs and FLP  Anemia SNF plan - routine CC to establish baseline    Hennie Duos, MD

## 2014-11-05 ENCOUNTER — Encounter: Payer: Self-pay | Admitting: Internal Medicine

## 2014-11-05 DIAGNOSIS — R627 Adult failure to thrive: Secondary | ICD-10-CM | POA: Insufficient documentation

## 2014-11-05 LAB — CULTURE, BLOOD (ROUTINE X 2)
Culture: NO GROWTH
Culture: NO GROWTH

## 2014-11-05 NOTE — Assessment & Plan Note (Signed)
TSH, B-12 within normal limits. -Doubt UTI, will continue antibiotics pending culture data. -Seen by physical therapy with recommendations for SNF versus home with 24-hour supervision. -Discussed with the wife [name] via phone, she is unable to currently meet his demands at home. SNF plan - OT/PT

## 2014-11-05 NOTE — Assessment & Plan Note (Signed)
Stable on no meds;SNF plan - daily monitoring

## 2014-11-05 NOTE — Assessment & Plan Note (Signed)
Known ejection fraction of 20-25%, compensated.SNF plan - on no specific meds;will monitor for decompensation

## 2014-11-05 NOTE — Assessment & Plan Note (Signed)
SNF plan - BMP to follow BUN/Cr

## 2014-11-05 NOTE — Assessment & Plan Note (Signed)
SNF plan - continue aricept

## 2014-11-05 NOTE — Assessment & Plan Note (Signed)
SNF plan -Continue anticoagulation with xarelto

## 2014-11-05 NOTE — Assessment & Plan Note (Addendum)
SNF plan - routine CBC to establish baseline

## 2014-11-05 NOTE — Assessment & Plan Note (Signed)
SNF plan - diet and supplements

## 2014-11-05 NOTE — Assessment & Plan Note (Signed)
SNF plan - diabetic  Diet, on no meds, not on nACE 2/2 renal dx, is on statin

## 2014-11-05 NOTE — Assessment & Plan Note (Signed)
SNF Plan - continue lipitor ; if pt is to be resident will need baseline labs and FLP

## 2014-11-16 ENCOUNTER — Non-Acute Institutional Stay (SKILLED_NURSING_FACILITY): Payer: Medicare Other | Admitting: Nurse Practitioner

## 2014-11-16 DIAGNOSIS — R627 Adult failure to thrive: Secondary | ICD-10-CM

## 2014-11-16 DIAGNOSIS — I2699 Other pulmonary embolism without acute cor pulmonale: Secondary | ICD-10-CM

## 2014-11-16 DIAGNOSIS — E785 Hyperlipidemia, unspecified: Secondary | ICD-10-CM | POA: Diagnosis not present

## 2014-11-16 DIAGNOSIS — N400 Enlarged prostate without lower urinary tract symptoms: Secondary | ICD-10-CM

## 2014-11-16 DIAGNOSIS — N390 Urinary tract infection, site not specified: Secondary | ICD-10-CM | POA: Diagnosis not present

## 2014-11-16 DIAGNOSIS — F0150 Vascular dementia without behavioral disturbance: Secondary | ICD-10-CM

## 2014-11-16 DIAGNOSIS — E119 Type 2 diabetes mellitus without complications: Secondary | ICD-10-CM | POA: Diagnosis not present

## 2014-11-16 NOTE — Progress Notes (Signed)
Patient ID: Grant Lara, male   DOB: February 27, 1941, 74 y.o.   MRN: [id number]    Nursing Home Location:  [facility]   Place of Service: SNF (31)  PCP: [name], MD  No Known Allergies  Chief Complaint  Patient presents with  . Discharge Note    HPI:  Patient is a 74 y.o. male seen today at [facility] for discharge home. Pt with a PMH of dementia, hypertension, hyperlipidemia, diabetes mellitus, depression, CAD, DVT, PE on Xarelto, PFO, chronic disease, stroke, BPH, systolic congestive heart failure (EF 20-25%). Pt was hospitalized from 7/17-19 due to weakness and possible UTI. Pt here at [facility] for strengthening due to weakness. Patient currently doing well with therapy, now stable to discharge home with home health.  Review of Systems:  Review of Systems  Constitutional: Negative for activity change, appetite change, fatigue and unexpected weight change.  HENT: Negative for congestion and hearing loss.   Eyes: Negative.   Respiratory: Negative for cough and shortness of breath.   Cardiovascular: Negative for chest pain, palpitations and leg swelling.  Gastrointestinal: Negative for abdominal pain, diarrhea and constipation.  Genitourinary: Negative for dysuria and difficulty urinating.  Musculoskeletal: Negative for myalgias and arthralgias.  Skin: Negative for color change and wound.  Neurological: Negative for dizziness and weakness.  Psychiatric/Behavioral: Negative for behavioral problems, confusion and agitation.    Past Medical History  Diagnosis Date  . Hypertension   . Dilated cardiomyopathy     2/12: EF 30-35%, trivial AI, mild RAE.  EF 2014 40 -45%  . CAD (coronary artery disease)     LHC 9/05 with Dr. Einar Gip:  dLM 20-30%, LAD 85%, oD1 20-30%.  PCI:  Taxus DES to LAD; Dx jailed and tx with POBA.  Last myoview 12/10: inf scar, no ischemia, EF 29%.  . DVT (deep venous thrombosis)   . Pulmonary embolus     chronic coumadin   . Hyperhomocystinemia   . PFO (patent foramen ovale)     Not mentioned on 2014 echo.  [name] HLD (hyperlipidemia)   . Crohn's disease   . Nephrolithiasis   . Diabetes mellitus     11/05/11 "borderline; don't take medications"  . Stroke 1993    "left arm can't hold steady; leg too"  . Arthritis     "used to have a touch in my legs"  . Memory difficulties   . Pneumonia ~ 2011    09-22-13 denies any recent SOB or breathing problems  . Swelling of both ankles      09-22-13 occ.feet, but denies pain.  . Bradycardia 06/01/2014  . Dementia    Past Surgical History  Procedure Laterality Date  . Colon surgery  1994; 1996    "for Crohn's disease"  . Appendectomy    . Implantable cardioverter defibrillator implant N/A 06/02/2014    Procedure: IMPLANTABLE CARDIOVERTER DEFIBRILLATOR IMPLANT;  Surgeon: [name], MD;  Location: [facility] CATH LAB;  Service: Cardiovascular;  Laterality: N/A;   Social History:   reports that he has been smoking Cigarettes.  He has a 26.5 pack-year smoking history. He has never used smokeless tobacco. He reports that he does not drink alcohol or use illicit drugs.  Family History  Problem Relation Age of Onset  . Diabetes type II Mother   . CAD Father   . Diabetes type II Brother     Medications: Patient's Medications  New Prescriptions   No medications on file  Previous Medications  ACETAMINOPHEN (TYLENOL) 325 MG TABLET    Take 2 tablets (650 mg total) by mouth every 6 (six) hours as needed for mild pain, moderate pain or fever.   ATORVASTATIN (LIPITOR) 10 MG TABLET    Take 1 tablet (10 mg total) by mouth daily at 6 PM.   DONEPEZIL (ARICEPT) 5 MG TABLET    Take 5 mg by mouth at bedtime.   ESZOPICLONE (LUNESTA) 1 MG TABS TABLET    Take 1 mg by mouth at bedtime as needed for sleep. for sleep   LATANOPROST (XALATAN) 0.005 % OPHTHALMIC SOLUTION    Place 1 drop into both eyes at bedtime.   OXYCODONE HCL 10 MG TABS    Take 1-2 tablets (10-20 mg total) by mouth every  4 (four) hours as needed (pain).   RIVAROXABAN (XARELTO) 20 MG TABS TABLET    Take 1 tablet (20 mg total) by mouth daily with supper.   SERTRALINE (ZOLOFT) 50 MG TABLET    Take 50 mg by mouth daily.    TAMSULOSIN (FLOMAX) 0.4 MG CAPS CAPSULE    Take 1 capsule (0.4 mg total) by mouth daily.   VITAMINS A & D (VITAMIN A & D) OINTMENT    Apply 1 application topically daily as needed for dry skin.  Modified Medications   No medications on file  Discontinued Medications   CIPROFLOXACIN (CIPRO) 250 MG TABLET    Take 1 tablet (250 mg total) by mouth 2 (two) times daily.   INSULIN ASPART (NOVOLOG) 100 UNIT/ML INJECTION    CBG 70 - 120: 0 units CBG 121 - 150: 1 unit CBG 151 - 200: 2 units CBG 201 - 250: 3 units CBG 251 - 300: 5 units CBG 301 - 350: 7 units CBG 351 - 400: 9 units     Physical Exam: Filed Vitals:   11/16/14 1147  BP: 118/72  Pulse: 72  Temp: 97.9 F (36.6 C)  Resp: 20  Weight: 113 lb (51.256 kg)    Physical Exam  Constitutional: He is oriented to person, place, and time. No distress.  Frail thin male.   HENT:  Head: Normocephalic and atraumatic.  Mouth/Throat: Oropharynx is clear and moist. No oropharyngeal exudate.  Eyes: Conjunctivae and EOM are normal. Pupils are equal, round, and reactive to light.  Neck: Normal range of motion. Neck supple.  Cardiovascular: Normal rate, regular rhythm and normal heart sounds.   Pulmonary/Chest: Effort normal and breath sounds normal.  Abdominal: Soft. Bowel sounds are normal.  Musculoskeletal: He exhibits no edema or tenderness.  Neurological: He is alert and oriented to person, place, and time.  Skin: Skin is warm and dry. He is not diaphoretic.  Psychiatric: He has a normal mood and affect.    Labs reviewed: Basic Metabolic Panel:  Recent Labs  04/16/14 0525  04/18/14 0912  07/18/14 0614 07/27/14 1649 10/30/14 2003  NA  --   < >  --   < > 140 140 138  K  --   < >  --   < > 4.0 3.9 3.8  CL  --   < >  --   < > 109  105 107  CO2  --   < >  --   < > 26 27 22   GLUCOSE  --   < >  --   < > 82 107* 168*  BUN  --   < >  --   < > 12 16 18   CREATININE  --   < >  --   < >  1.09 1.28 1.29*  CALCIUM  --   < >  --   < > 8.7 8.9 8.5*  MG 1.7  --  2.2  --   --   --   --   < > = values in this interval not displayed. Liver Function Tests:  Recent Labs  11/19/13 2039 04/15/14 1619 07/15/14 2325  AST 9 16 28   ALT 6 11 51  ALKPHOS 84 116 92  BILITOT 1.7* 0.6 0.4  PROT 7.0 6.4 5.3*  ALBUMIN 2.7* 3.4* 2.6*   No results for input(s): LIPASE, AMYLASE in the last 8760 hours. No results for input(s): AMMONIA in the last 8760 hours. CBC:  Recent Labs  07/15/14 2325  07/18/14 0614 07/27/14 1649 10/30/14 2003  WBC 7.9  < > 5.3 7.8 6.6  NEUTROABS 6.5  --  2.7 6.3  --   HGB 8.4*  < > 8.5* 9.6* 8.6*  HCT 26.1*  < > 26.4* 30.6* 29.4*  MCV 94.6  < > 93.0 92.4 74.2*  PLT 195  < > 196 210 227  < > = values in this interval not displayed. TSH:  Recent Labs  10/31/14 1636  TSH 0.559   A1C: Lab Results  Component Value Date   HGBA1C 6.5* 05/30/2014   Lipid Panel:  Recent Labs  04/16/14 0030  CHOL 149  HDL 48  LDLCALC 80  TRIG 104  CHOLHDL 3.1    Assessment/Plan 1. UTI (lower urinary tract infection) Resolved. No further symptoms  2. FTT (failure to thrive) in adult -worse in the setting of UTI, has improved, weight up since pt has been at [place] [facility], to cont supplements.    3. Vascular dementia, without behavioral disturbance -stable, conts on aricept 5 mg daily   4. Pulmonary embolism -cont on xarelto 20 mg daily, no signs of recurrence.   5. Type 2 diabetes mellitus without complication -SSI while at [facility], blood sugars well controlled on diet. Will dc SSI and PCP to follow up.   6. Hyperlipidemia -to cont lipitor 10 mg daily   7. BPH (benign prostatic hyperplasia) Stable, conts on flomax  pt is stable for discharge-will need PT/OT/HHA/SW per home health. Some issues with care  at home per wife, pt has improved since he has been at [facility] but will get outpt SW referral for ongoing follow up. No DME needed. Rx written.  will need to follow up with PCP within 2 weeks.    Carlos American. Harle Battiest  [facility] [phone] 8 am - 5 pm) [phone] (after hours)

## 2014-12-21 ENCOUNTER — Encounter (HOSPITAL_COMMUNITY): Payer: Self-pay | Admitting: Family Medicine

## 2014-12-21 ENCOUNTER — Emergency Department (HOSPITAL_COMMUNITY): Payer: Medicare Other

## 2014-12-21 ENCOUNTER — Emergency Department (HOSPITAL_COMMUNITY)
Admission: EM | Admit: 2014-12-21 | Discharge: 2014-12-21 | Disposition: A | Discharge status: Home / Self Care | Payer: Medicare Other | Attending: Emergency Medicine | Admitting: Emergency Medicine

## 2014-12-21 DIAGNOSIS — Z720 Tobacco use: Secondary | ICD-10-CM | POA: Diagnosis not present

## 2014-12-21 DIAGNOSIS — Z8701 Personal history of pneumonia (recurrent): Secondary | ICD-10-CM | POA: Diagnosis not present

## 2014-12-21 DIAGNOSIS — Z8719 Personal history of other diseases of the digestive system: Secondary | ICD-10-CM | POA: Insufficient documentation

## 2014-12-21 DIAGNOSIS — Z8673 Personal history of transient ischemic attack (TIA), and cerebral infarction without residual deficits: Secondary | ICD-10-CM | POA: Diagnosis not present

## 2014-12-21 DIAGNOSIS — I2510 Atherosclerotic heart disease of native coronary artery without angina pectoris: Secondary | ICD-10-CM | POA: Diagnosis not present

## 2014-12-21 DIAGNOSIS — N390 Urinary tract infection, site not specified: Secondary | ICD-10-CM

## 2014-12-21 DIAGNOSIS — F0390 Unspecified dementia without behavioral disturbance: Secondary | ICD-10-CM | POA: Diagnosis not present

## 2014-12-21 DIAGNOSIS — R531 Weakness: Secondary | ICD-10-CM

## 2014-12-21 DIAGNOSIS — Q211 Atrial septal defect: Secondary | ICD-10-CM | POA: Diagnosis not present

## 2014-12-21 DIAGNOSIS — I10 Essential (primary) hypertension: Secondary | ICD-10-CM | POA: Diagnosis not present

## 2014-12-21 DIAGNOSIS — Z87442 Personal history of urinary calculi: Secondary | ICD-10-CM | POA: Insufficient documentation

## 2014-12-21 DIAGNOSIS — Z79899 Other long term (current) drug therapy: Secondary | ICD-10-CM | POA: Diagnosis not present

## 2014-12-21 DIAGNOSIS — E785 Hyperlipidemia, unspecified: Secondary | ICD-10-CM | POA: Insufficient documentation

## 2014-12-21 DIAGNOSIS — Z86711 Personal history of pulmonary embolism: Secondary | ICD-10-CM | POA: Insufficient documentation

## 2014-12-21 DIAGNOSIS — Z8739 Personal history of other diseases of the musculoskeletal system and connective tissue: Secondary | ICD-10-CM | POA: Insufficient documentation

## 2014-12-21 DIAGNOSIS — Z86718 Personal history of other venous thrombosis and embolism: Secondary | ICD-10-CM | POA: Diagnosis not present

## 2014-12-21 LAB — URINE MICROSCOPIC-ADD ON

## 2014-12-21 LAB — URINALYSIS, ROUTINE W REFLEX MICROSCOPIC
Bilirubin Urine: NEGATIVE
Glucose, UA: NEGATIVE mg/dL
Ketones, ur: NEGATIVE mg/dL
Nitrite: NEGATIVE
PROTEIN: 100 mg/dL — AB
SPECIFIC GRAVITY, URINE: 1.017 (ref 1.005–1.030)
Urobilinogen, UA: 1 mg/dL (ref 0.0–1.0)
pH: 6 (ref 5.0–8.0)

## 2014-12-21 LAB — BASIC METABOLIC PANEL
ANION GAP: 7 (ref 5–15)
BUN: 10 mg/dL (ref 6–20)
CALCIUM: 8.7 mg/dL — AB (ref 8.9–10.3)
CHLORIDE: 109 mmol/L (ref 101–111)
CO2: 25 mmol/L (ref 22–32)
Creatinine, Ser: 1.11 mg/dL (ref 0.61–1.24)
GFR calc Af Amer: >60 mL/min (ref >60)
GFR calc non Af Amer: >60 mL/min (ref >60)
GLUCOSE: 161 mg/dL — AB (ref 65–99)
POTASSIUM: 3.6 mmol/L (ref 3.5–5.1)
Sodium: 141 mmol/L (ref 135–145)

## 2014-12-21 LAB — CBC
HEMATOCRIT: 28.5 % — AB (ref 39.0–52.0)
HEMOGLOBIN: 8.4 g/dL — AB (ref 13.0–17.0)
MCH: 22.2 pg — AB (ref 26.0–34.0)
MCHC: 29.5 g/dL — ABNORMAL LOW (ref 30.0–36.0)
MCV: 75.4 fL — AB (ref 78.0–100.0)
Platelets: 161 10*3/uL (ref 150–400)
RBC: 3.78 MIL/uL — AB (ref 4.22–5.81)
RDW: 21.5 % — ABNORMAL HIGH (ref 11.5–15.5)
WBC: 5 10*3/uL (ref 4.0–10.5)

## 2014-12-21 LAB — CBG MONITORING, ED: GLUCOSE-CAPILLARY: 164 mg/dL — AB (ref 65–99)

## 2014-12-21 MED ORDER — CEPHALEXIN 500 MG PO CAPS: 500.0000 mg | ORAL_CAPSULE | Freq: Two times a day (BID) | ORAL | Status: DC

## 2014-12-21 MED ORDER — CEPHALEXIN 250 MG PO CAPS
500.0000 mg | ORAL_CAPSULE | Freq: Once | ORAL | Status: AC
  Administered 2014-12-21: 500 mg via ORAL
  Filled 2014-12-21: qty 2

## 2014-12-21 MED ORDER — SODIUM CHLORIDE 0.9 % IV BOLUS (SEPSIS)
1000.0000 mL | Freq: Once | INTRAVENOUS | Status: AC
  Administered 2014-12-21: 1000 mL via INTRAVENOUS

## 2014-12-21 NOTE — ED Notes (Signed)
Per pt family pt became very weak this am after he ate. sts the therapist came to the house this am and he couldn't stand. Denies chest pain, SOB.

## 2014-12-21 NOTE — Discharge Instructions (Signed)
As discussed, your evaluation today has been largely reassuring.  But, it is important that you monitor your condition carefully, and do not hesitate to return to the ED if you develop new, or concerning changes in your condition. ? ?Otherwise, please follow-up with your physician for appropriate ongoing care. ? ?

## 2014-12-21 NOTE — ED Notes (Signed)
Pt stable, ambulatory, states understanding of discharge instructions 

## 2014-12-21 NOTE — ED Notes (Signed)
Pt is difficult IV stick, 2 nurses attempted, IV team notified.

## 2014-12-21 NOTE — ED Notes (Signed)
Pt assisted into bed, family member states that he has gotten increasingly weak today, normally is able to ambulate without help, was unable to stand on own today.

## 2014-12-21 NOTE — ED Notes (Signed)
Pt hooked back up to the monitor with a 5 lead, BP cuff and pulse ox

## 2014-12-21 NOTE — ED Provider Notes (Signed)
CSN: [id number]     Arrival date & time 12/21/14  1109 History   First MD Initiated Contact with Patient 12/21/14 1128     Chief Complaint  Patient presents with  . Weakness     (Consider location/radiation/quality/duration/timing/severity/associated sxs/prior Treatment) HPI Patient presents with 2 family members who provide the history of present illness. Patient has a history of dementia. 5 caveat. Family members, the patient had increasing weakness today, after going to bed in his usual state of health yesterday. Acknowledge the patient's dementia is waxing/waning in severity, with no clear precipitating factors. They state that today, the patient was less ambulatory than usual, almost well, seemingly due to weakness. They deny a new vomiting, fever, falling, chills, cough. The patient self denies any focal pain, acknowledges feeling weak all over Past Medical History  Diagnosis Date  . Hypertension   . Dilated cardiomyopathy     2/12: EF 30-35%, trivial AI, mild RAE.  EF 2014 40 -45%  . CAD (coronary artery disease)     LHC 9/05 with Dr. Einar Gip:  dLM 20-30%, LAD 85%, oD1 20-30%.  PCI:  Taxus DES to LAD; Dx jailed and tx with POBA.  Last myoview 12/10: inf scar, no ischemia, EF 29%.  . DVT (deep venous thrombosis)   . Pulmonary embolus     chronic coumadin  . Hyperhomocystinemia   . PFO (patent foramen ovale)     Not mentioned on 2014 echo.  [name] HLD (hyperlipidemia)   . Crohn's disease   . Nephrolithiasis   . Diabetes mellitus     11/05/11 "borderline; don't take medications"  . Stroke 1993    "left arm can't hold steady; leg too"  . Arthritis     "used to have a touch in my legs"  . Memory difficulties   . Pneumonia ~ 2011    09-22-13 denies any recent SOB or breathing problems  . Swelling of both ankles      09-22-13 occ.feet, but denies pain.  . Bradycardia 06/01/2014  . Dementia    Past Surgical History  Procedure Laterality Date  . Colon surgery  1994; 1996    "for  Crohn's disease"  . Appendectomy    . Implantable cardioverter defibrillator implant N/A 06/02/2014    Procedure: IMPLANTABLE CARDIOVERTER DEFIBRILLATOR IMPLANT;  Surgeon: [name], MD;  Location: [facility] CATH LAB;  Service: Cardiovascular;  Laterality: N/A;   Family History  Problem Relation Age of Onset  . Diabetes type II Mother   . CAD Father   . Diabetes type II Brother    Social History  Substance Use Topics  . Smoking status: Current Some Day Smoker -- 0.50 packs/day for 53 years    Types: Cigarettes  . Smokeless tobacco: Never Used  . Alcohol Use: No    Review of Systems  Unable to perform ROS: Dementia      Allergies  Review of patient's allergies indicates no known allergies.  Home Medications   Prior to Admission medications   Medication Sig Start Date End Date Taking? Authorizing Provider  acetaminophen (TYLENOL) 325 MG tablet Take 2 tablets (650 mg total) by mouth every 6 (six) hours as needed for mild pain, moderate pain or fever. 11/23/13   Modena Jansky, MD  atorvastatin (LIPITOR) 10 MG tablet Take 1 tablet (10 mg total) by mouth daily at 6 PM. 04/18/14   Maryann Mikhail, DO  donepezil (ARICEPT) 5 MG tablet Take 5 mg by mouth at bedtime.    Historical Provider,  MD  eszopiclone (LUNESTA) 1 MG TABS tablet Take 1 mg by mouth at bedtime as needed for sleep. for sleep 10/20/14   Historical Provider, MD  latanoprost (XALATAN) 0.005 % ophthalmic solution Place 1 drop into both eyes at bedtime. 06/29/13   Historical Provider, MD  Oxycodone HCl 10 MG TABS Take 1-2 tablets (10-20 mg total) by mouth every 4 (four) hours as needed (pain). 11/01/14   Erline Hau, MD  rivaroxaban (XARELTO) 20 MG TABS tablet Take 1 tablet (20 mg total) by mouth daily with supper. 06/23/14   Hosie Poisson, MD  sertraline (ZOLOFT) 50 MG tablet Take 50 mg by mouth daily.  05/18/14   Historical Provider, MD  tamsulosin (FLOMAX) 0.4 MG CAPS capsule Take 1 capsule (0.4 mg total) by mouth  daily. 04/18/14   Maryann Mikhail, DO  Vitamins A & D (VITAMIN A & D) ointment Apply 1 application topically daily as needed for dry skin.    Historical Provider, MD   BP 156/73 mmHg  Pulse 104  Temp(Src) 98.4 F (36.9 C) (Oral)  Resp 15  Ht 5' 8"  (1.727 m)  SpO2 99% Physical Exam  Constitutional: He appears cachectic. He has a sickly appearance. No distress.  HENT:  Head: Normocephalic and atraumatic.  Eyes: Conjunctivae and EOM are normal.  Cardiovascular: Normal rate and regular rhythm.   Pulmonary/Chest: Effort normal. No stridor. No respiratory distress.  Abdominal: He exhibits no distension.  Musculoskeletal: He exhibits no edema.  Neurological: He displays atrophy. He displays no tremor. No cranial nerve deficit or sensory deficit. He displays no seizure activity.  Skin: Skin is warm and dry.  Psychiatric: His speech is delayed. He is slowed. Cognition and memory are impaired.  Nursing note and vitals reviewed.   ED Course  Procedures (including critical care time) Labs Review Labs Reviewed  BASIC METABOLIC PANEL - Abnormal; Notable for the following:    Glucose, Bld 161 (*)    Calcium 8.7 (*)    All other components within normal limits  CBC - Abnormal; Notable for the following:    RBC 3.78 (*)    Hemoglobin 8.4 (*)    HCT 28.5 (*)    MCV 75.4 (*)    MCH 22.2 (*)    MCHC 29.5 (*)    RDW 21.5 (*)    All other components within normal limits  URINALYSIS, ROUTINE W REFLEX MICROSCOPIC (NOT AT [facility]) - Abnormal; Notable for the following:    APPearance CLOUDY (*)    Hgb urine dipstick LARGE (*)    Protein, ur 100 (*)    Leukocytes, UA TRACE (*)    All other components within normal limits  URINE MICROSCOPIC-ADD ON - Abnormal; Notable for the following:    Bacteria, UA FEW (*)    All other components within normal limits  CBG MONITORING, ED - Abnormal; Notable for the following:    Glucose-Capillary 164 (*)    All other components within normal limits     Imaging Review Dg Chest 2 View  12/21/2014   CLINICAL DATA:  Weakness of the arms for 1.5 hours.  EXAM: CHEST  2 VIEW  COMPARISON:  October 30, 2014  FINDINGS: The heart size and mediastinal contours are stable. Cardiac pacemaker is unchanged. There is mild scarring or atelectasis of right lung base. There is no focal infiltrate, pulmonary edema, or pleural effusion. The visualized skeletal structures are stable.  IMPRESSION: No active cardiopulmonary disease.   Electronically Signed   By: Seward Meth  Augustin Coupe M.D.   On: 12/21/2014 12:56   Ct Head Wo Contrast  12/21/2014   CLINICAL DATA:  Acute onset of weakness with inability to stand. Difficulty speaking.  EXAM: CT HEAD WITHOUT CONTRAST  TECHNIQUE: Contiguous axial images were obtained from the base of the skull through the vertex without intravenous contrast.  COMPARISON:  10/30/2014 an multiple previous  FINDINGS: The brain shows extensive generalized atrophy. There are old cerebellar infarctions most notable in the inferior cerebellum left more than right. There are old small vessel infarctions affecting the thalami basal ganglia. There is old infarction affecting both occipital and parietal regions, left more than right. No sign of acute infarction, mass lesion, hemorrhage, hydrocephalus or extra-axial collection. No calvarial abnormality. Sinuses, middle ears and mastoids are clear.  IMPRESSION: Generalized brain atrophy. Extensive old infarctions throughout the cerebellum and cerebral hemispheres. No acute finding by CT.   Electronically Signed   By: Nelson Chimes M.D.   On: 12/21/2014 16:04   I have personally reviewed and evaluated these images and lab results as part of my medical decision-making.   EKG Interpretation   Date/Time:  Wednesday December 21 2014 11:19:53 EDT Ventricular Rate:  88 PR Interval:  142 QRS Duration: 90 QT Interval:  356 QTC Calculation: 430 R Axis:   81 Text Interpretation:  Normal sinus rhythm Minimal voltage  criteria for  LVH, may be normal variant Septal infarct , age undetermined T wave  abnormality, consider inferior ischemia Abnormal ECG Sinus rhythm Left  ventricular hypertrophy T wave abnormality Early repolarization pattern  Abnormal ekg Confirmed by Carmin Muskrat  MD ([id number]) on 12/21/2014 11:28:36  AM     4:44 PM Patient sitting upright, drinking coffee.  No complaints.  MDM  This elderly male with dementia presents with family members to describe increasing weakness. Here the patient is awake, alert, afebrile, with no evidence for pneumonia, new intracranial pathology, substantial lab abnormalities. There is some suspicion for urinary tract infection, though the patient's sample is poor, and given his use of anticoagulated, some suspicion for examination. Patient was started on antibiotics, while urine culture was sent. Patient discharged in stable condition  Carmin Muskrat, MD 12/21/14 1645

## 2014-12-21 NOTE — ED Notes (Signed)
CHECKED CBG 164

## 2015-03-29 ENCOUNTER — Emergency Department (HOSPITAL_COMMUNITY)
Admission: EM | Admit: 2015-03-29 | Discharge: 2015-03-29 | Disposition: A | Discharge status: Home / Self Care | Payer: Medicare Other | Attending: Emergency Medicine | Admitting: Emergency Medicine

## 2015-03-29 ENCOUNTER — Encounter (HOSPITAL_COMMUNITY): Payer: Self-pay | Admitting: Vascular Surgery

## 2015-03-29 DIAGNOSIS — Z7901 Long term (current) use of anticoagulants: Secondary | ICD-10-CM | POA: Diagnosis not present

## 2015-03-29 DIAGNOSIS — M1990 Unspecified osteoarthritis, unspecified site: Secondary | ICD-10-CM | POA: Diagnosis not present

## 2015-03-29 DIAGNOSIS — Z8673 Personal history of transient ischemic attack (TIA), and cerebral infarction without residual deficits: Secondary | ICD-10-CM | POA: Insufficient documentation

## 2015-03-29 DIAGNOSIS — Y9389 Activity, other specified: Secondary | ICD-10-CM | POA: Diagnosis not present

## 2015-03-29 DIAGNOSIS — I2510 Atherosclerotic heart disease of native coronary artery without angina pectoris: Secondary | ICD-10-CM | POA: Diagnosis not present

## 2015-03-29 DIAGNOSIS — R001 Bradycardia, unspecified: Secondary | ICD-10-CM | POA: Diagnosis not present

## 2015-03-29 DIAGNOSIS — Z8719 Personal history of other diseases of the digestive system: Secondary | ICD-10-CM | POA: Diagnosis not present

## 2015-03-29 DIAGNOSIS — Z79899 Other long term (current) drug therapy: Secondary | ICD-10-CM | POA: Diagnosis not present

## 2015-03-29 DIAGNOSIS — Z87442 Personal history of urinary calculi: Secondary | ICD-10-CM | POA: Diagnosis not present

## 2015-03-29 DIAGNOSIS — Z043 Encounter for examination and observation following other accident: Secondary | ICD-10-CM | POA: Diagnosis not present

## 2015-03-29 DIAGNOSIS — Z8701 Personal history of pneumonia (recurrent): Secondary | ICD-10-CM | POA: Insufficient documentation

## 2015-03-29 DIAGNOSIS — Y998 Other external cause status: Secondary | ICD-10-CM | POA: Insufficient documentation

## 2015-03-29 DIAGNOSIS — Z86718 Personal history of other venous thrombosis and embolism: Secondary | ICD-10-CM | POA: Insufficient documentation

## 2015-03-29 DIAGNOSIS — E785 Hyperlipidemia, unspecified: Secondary | ICD-10-CM | POA: Insufficient documentation

## 2015-03-29 DIAGNOSIS — Z86711 Personal history of pulmonary embolism: Secondary | ICD-10-CM | POA: Insufficient documentation

## 2015-03-29 DIAGNOSIS — W19XXXA Unspecified fall, initial encounter: Secondary | ICD-10-CM | POA: Insufficient documentation

## 2015-03-29 DIAGNOSIS — Z95810 Presence of automatic (implantable) cardiac defibrillator: Secondary | ICD-10-CM | POA: Insufficient documentation

## 2015-03-29 DIAGNOSIS — Y9289 Other specified places as the place of occurrence of the external cause: Secondary | ICD-10-CM | POA: Insufficient documentation

## 2015-03-29 DIAGNOSIS — R531 Weakness: Secondary | ICD-10-CM | POA: Diagnosis not present

## 2015-03-29 DIAGNOSIS — F0390 Unspecified dementia without behavioral disturbance: Secondary | ICD-10-CM | POA: Diagnosis not present

## 2015-03-29 DIAGNOSIS — F17210 Nicotine dependence, cigarettes, uncomplicated: Secondary | ICD-10-CM | POA: Insufficient documentation

## 2015-03-29 DIAGNOSIS — I10 Essential (primary) hypertension: Secondary | ICD-10-CM | POA: Diagnosis not present

## 2015-03-29 LAB — URINE MICROSCOPIC-ADD ON

## 2015-03-29 LAB — BASIC METABOLIC PANEL
Anion gap: 7 (ref 5–15)
BUN: 12 mg/dL (ref 6–20)
CALCIUM: 8.7 mg/dL — AB (ref 8.9–10.3)
CHLORIDE: 107 mmol/L (ref 101–111)
CO2: 27 mmol/L (ref 22–32)
CREATININE: 1.11 mg/dL (ref 0.61–1.24)
GFR calc non Af Amer: >60 mL/min (ref >60)
Glucose, Bld: 97 mg/dL (ref 65–99)
Potassium: 4 mmol/L (ref 3.5–5.1)
Sodium: 141 mmol/L (ref 135–145)

## 2015-03-29 LAB — URINALYSIS, ROUTINE W REFLEX MICROSCOPIC
BILIRUBIN URINE: NEGATIVE
GLUCOSE, UA: NEGATIVE mg/dL
KETONES UR: NEGATIVE mg/dL
Leukocytes, UA: NEGATIVE
Nitrite: NEGATIVE
PROTEIN: 30 mg/dL — AB
Specific Gravity, Urine: 1.027 (ref 1.005–1.030)
pH: 6 (ref 5.0–8.0)

## 2015-03-29 LAB — CBC
HCT: 32.2 % — ABNORMAL LOW (ref 39.0–52.0)
Hemoglobin: 9.7 g/dL — ABNORMAL LOW (ref 13.0–17.0)
MCH: 23.5 pg — AB (ref 26.0–34.0)
MCHC: 30.1 g/dL (ref 30.0–36.0)
MCV: 78 fL (ref 78.0–100.0)
PLATELETS: 247 10*3/uL (ref 150–400)
RBC: 4.13 MIL/uL — AB (ref 4.22–5.81)
RDW: 17.4 % — AB (ref 11.5–15.5)
WBC: 4.9 10*3/uL (ref 4.0–10.5)

## 2015-03-29 LAB — HEPATIC FUNCTION PANEL
ALT: 15 U/L — ABNORMAL LOW (ref 17–63)
AST: 18 U/L (ref 15–41)
Albumin: 2.7 g/dL — ABNORMAL LOW (ref 3.5–5.0)
Alkaline Phosphatase: 77 U/L (ref 38–126)
BILIRUBIN TOTAL: 0.3 mg/dL (ref 0.3–1.2)
Total Protein: 5.9 g/dL — ABNORMAL LOW (ref 6.5–8.1)

## 2015-03-29 LAB — TROPONIN I

## 2015-03-29 NOTE — ED Provider Notes (Signed)
CSN: [id number]     Arrival date & time 03/29/15  1820 History   First MD Initiated Contact with Patient 03/29/15 1902     Chief Complaint  Patient presents with  . Fall  . Weakness     (Consider location/radiation/quality/duration/timing/severity/associated sxs/prior Treatment) Patient is a 74 y.o. male presenting with weakness. The history is provided by the spouse.  Weakness This is a chronic problem. The current episode started more than 1 week ago. The problem occurs constantly. The problem has been gradually worsening. Pertinent negatives include no chest pain, no headaches and no shortness of breath. Nothing aggravates the symptoms. Nothing relieves the symptoms. He has tried nothing for the symptoms.    Past Medical History  Diagnosis Date  . Hypertension   . Dilated cardiomyopathy ([place])     2/12: EF 30-35%, trivial AI, mild RAE.  EF 2014 40 -45%  . CAD (coronary artery disease)     LHC 9/05 with Dr. Einar Gip:  dLM 20-30%, LAD 85%, oD1 20-30%.  PCI:  Taxus DES to LAD; Dx jailed and tx with POBA.  Last myoview 12/10: inf scar, no ischemia, EF 29%.  . DVT (deep venous thrombosis) ([place])   . Pulmonary embolus ([facility])     chronic coumadin  . Hyperhomocystinemia ([place])   . PFO (patent foramen ovale)     Not mentioned on 2014 echo.  [name] HLD (hyperlipidemia)   . Crohn's disease ([place])   . Nephrolithiasis   . Diabetes mellitus     11/05/11 "borderline; don't take medications"  . Stroke [facility]) 1993    "left arm can't hold steady; leg too"  . Arthritis     "used to have a touch in my legs"  . Memory difficulties   . Pneumonia ~ 2011    09-22-13 denies any recent SOB or breathing problems  . Swelling of both ankles      09-22-13 occ.feet, but denies pain.  . Bradycardia 06/01/2014  . Dementia    Past Surgical History  Procedure Laterality Date  . Colon surgery  1994; 1996    "for Crohn's disease"  . Appendectomy    . Implantable cardioverter defibrillator implant N/A 06/02/2014   Procedure: IMPLANTABLE CARDIOVERTER DEFIBRILLATOR IMPLANT;  Surgeon: [name], MD;  Location: [facility] CATH LAB;  Service: Cardiovascular;  Laterality: N/A;   Family History  Problem Relation Age of Onset  . Diabetes type II Mother   . CAD Father   . Diabetes type II Brother    Social History  Substance Use Topics  . Smoking status: Current Some Day Smoker -- 0.50 packs/day for 53 years    Types: Cigarettes  . Smokeless tobacco: Never Used  . Alcohol Use: No    Review of Systems  Unable to perform ROS: Dementia  Respiratory: Negative for shortness of breath.   Cardiovascular: Negative for chest pain.  Neurological: Positive for weakness. Negative for headaches.      Allergies  Review of patient's allergies indicates no known allergies.  Home Medications   Prior to Admission medications   Medication Sig Start Date End Date Taking? Authorizing Provider  acetaminophen (TYLENOL) 325 MG tablet Take 2 tablets (650 mg total) by mouth every 6 (six) hours as needed for mild pain, moderate pain or fever. 11/23/13  Yes Modena Jansky, MD  ALPRAZolam Duanne Moron) 0.5 MG tablet Take 0.5 mg by mouth daily. 03/28/15  Yes Historical Provider, MD  atorvastatin (LIPITOR) 10 MG tablet Take 1 tablet (10 mg total) by mouth  daily at 6 PM. 04/18/14  Yes Maryann Mikhail, DO  donepezil (ARICEPT) 10 MG tablet Take 10 mg by mouth daily. 02/23/15  Yes Historical Provider, MD  eszopiclone (LUNESTA) 1 MG TABS tablet Take 1 mg by mouth at bedtime as needed for sleep. for sleep 10/20/14  Yes Historical Provider, MD  latanoprost (XALATAN) 0.005 % ophthalmic solution Place 1 drop into both eyes at bedtime. 06/29/13  Yes Historical Provider, MD  rivaroxaban (XARELTO) 20 MG TABS tablet Take 1 tablet (20 mg total) by mouth daily with supper. 06/23/14  Yes Hosie Poisson, MD  sertraline (ZOLOFT) 50 MG tablet Take 50 mg by mouth daily.  05/18/14  Yes Historical Provider, MD  tamsulosin (FLOMAX) 0.4 MG CAPS capsule Take 1 capsule  (0.4 mg total) by mouth daily. 04/18/14  Yes Maryann Mikhail, DO  Vitamins A & D (VITAMIN A & D) ointment Apply 1 application topically daily as needed for dry skin.   Yes Historical Provider, MD   BP 165/90 mmHg  Pulse 60  Temp(Src) 98 F (36.7 C) (Oral)  Resp 9  SpO2 99% Physical Exam  Constitutional: He appears well-developed and well-nourished.  HENT:  Head: Normocephalic and atraumatic.  Eyes: Conjunctivae are normal. Pupils are equal, round, and reactive to light.  Neck: Normal range of motion.  Cardiovascular: Normal rate and regular rhythm.   Pulmonary/Chest: He has no wheezes. He has no rales.  Abdominal: Soft. He exhibits no distension. There is no tenderness.  Musculoskeletal: Normal range of motion. He exhibits no edema or tenderness.  Neurological: He is alert. No cranial nerve deficit. Coordination normal.  Skin: Skin is warm and dry.  Nursing note and vitals reviewed.   ED Course  Procedures (including critical care time) Labs Review Labs Reviewed  BASIC METABOLIC PANEL - Abnormal; Notable for the following:    Calcium 8.7 (*)    All other components within normal limits  CBC - Abnormal; Notable for the following:    RBC 4.13 (*)    Hemoglobin 9.7 (*)    HCT 32.2 (*)    MCH 23.5 (*)    RDW 17.4 (*)    All other components within normal limits  URINALYSIS, ROUTINE W REFLEX MICROSCOPIC (NOT AT [facility]) - Abnormal; Notable for the following:    Hgb urine dipstick SMALL (*)    Protein, ur 30 (*)    All other components within normal limits  HEPATIC FUNCTION PANEL - Abnormal; Notable for the following:    Total Protein 5.9 (*)    Albumin 2.7 (*)    ALT 15 (*)    Bilirubin, Direct <0.1 (*)    All other components within normal limits  URINE MICROSCOPIC-ADD ON - Abnormal; Notable for the following:    Squamous Epithelial / LPF 0-5 (*)    Bacteria, UA RARE (*)    Casts HYALINE CASTS (*)    All other components within normal limits  TROPONIN I    Imaging  Review No results found. I have personally reviewed and evaluated these images and lab results as part of my medical decision-making.   EKG Interpretation   Date/Time:  Wednesday March 29 2015 18:46:55 EST Ventricular Rate:  63 PR Interval:  140 QRS Duration: 84 QT Interval:  414 QTC Calculation: 423 R Axis:   81 Text Interpretation:  Sinus rhythm with occasional Premature ventricular  complexes Moderate voltage criteria for LVH, may be normal variant Cannot  rule out Anteroseptal infarct , age undetermined Abnormal ECG Confirmed by  Callee Rohrig MD, Corene Cornea [phone]) on 03/29/2015 7:23:04 PM      MDM   Final diagnoses:  Weakness    Increasing weakness and increased falls. No recent head trauma or change in mental status. Symptoms likely secondary to worsening dementia. Hasn't been eating as well either. Labs negative here. No need for imaging at this time. D/W care management and will attempt to get help in home. Family will work on ALF placement through PCP.     Merrily Pew, MD 03/30/15 [phone]

## 2015-03-29 NOTE — ED Notes (Signed)
Notified Pt that we need a urine sample.

## 2015-03-29 NOTE — ED Notes (Signed)
Case management at the bedside.

## 2015-03-29 NOTE — ED Notes (Signed)
Pt reports to the ED for eval of generalized weakness and a fall. Pt has been weak for over a week and then today he fell because of the weakness. Pt denies any pain or injury r/t the fall. Pts family reports that his urine dark colored. Pt has hx of alzheimer's. Denies any HA, CP, or SOB. Has had a productive cough. Pt A&O x3, resp e/u, and skin warm and dry. Disoriented to time. Family member states he normally knows what month it is.

## 2015-03-29 NOTE — Care Management Note (Signed)
Case Management Note  Patient Details  Name: Grant Lara MRN: [id number] Date of Birth: 11-28-1940  Subjective/Objective:                  Admitted with fall and weakness  Action/Plan: Cm spoke to the wife at the bedside as the patient has dementia. Spouse said that the patient is getting [facility] services with [facility] and would like to use them for the additional services ordered. Cm advised that MD ordered PT/OT/Aide and spouse was agreeable. CM faxed orders along with face to face and MD note to [facility] through [facility] fax system. Spouse said that there are no DME needs. Patient has raised toilet seat, walker, wheelchair, cane. Cm asked about BSC and spouse declined. Spouse said that a SW is also working with them as needed. Spouse said they do not have any difficulty obtaining medications. No further CM needs identified at this time.   Expected Discharge Date:  03/29/15               Expected Discharge Plan:  [place]  In-House Referral:     Discharge planning Services  CM Consult  Post Acute Care Choice:  Home Health Choice offered to:  Spouse  DME Arranged:    DME Agency:     HH Arranged:  PT, OT, Nurse's Aide [place] Agency:  [place]  Status of Service:  Completed, signed off  Medicare Important Message Given:    Date Medicare IM Given:    Medicare IM give by:    Date Additional Medicare IM Given:    Additional Medicare Important Message give by:     If discussed at [place] of Stay Meetings, dates discussed:    Additional Comments:  Guido Sander, RN 03/29/2015, 10:33 PM

## 2015-04-06 ENCOUNTER — Non-Acute Institutional Stay (SKILLED_NURSING_FACILITY): Payer: Medicare Other | Admitting: Internal Medicine

## 2015-04-06 ENCOUNTER — Encounter: Payer: Self-pay | Admitting: Internal Medicine

## 2015-04-06 DIAGNOSIS — N400 Enlarged prostate without lower urinary tract symptoms: Secondary | ICD-10-CM

## 2015-04-06 DIAGNOSIS — I5022 Chronic systolic (congestive) heart failure: Secondary | ICD-10-CM

## 2015-04-06 DIAGNOSIS — E785 Hyperlipidemia, unspecified: Secondary | ICD-10-CM | POA: Diagnosis not present

## 2015-04-06 DIAGNOSIS — I110 Hypertensive heart disease with heart failure: Secondary | ICD-10-CM | POA: Diagnosis not present

## 2015-04-06 DIAGNOSIS — I2510 Atherosclerotic heart disease of native coronary artery without angina pectoris: Secondary | ICD-10-CM

## 2015-04-06 DIAGNOSIS — I2782 Chronic pulmonary embolism: Secondary | ICD-10-CM | POA: Diagnosis not present

## 2015-04-06 DIAGNOSIS — R627 Adult failure to thrive: Secondary | ICD-10-CM | POA: Diagnosis not present

## 2015-04-06 DIAGNOSIS — F0393 Unspecified dementia, unspecified severity, with mood disturbance: Secondary | ICD-10-CM

## 2015-04-06 DIAGNOSIS — F0280 Dementia in other diseases classified elsewhere without behavioral disturbance: Secondary | ICD-10-CM

## 2015-04-06 DIAGNOSIS — Z8669 Personal history of other diseases of the nervous system and sense organs: Secondary | ICD-10-CM

## 2015-04-06 DIAGNOSIS — F419 Anxiety disorder, unspecified: Secondary | ICD-10-CM | POA: Diagnosis not present

## 2015-04-06 DIAGNOSIS — Z8673 Personal history of transient ischemic attack (TIA), and cerebral infarction without residual deficits: Secondary | ICD-10-CM

## 2015-04-06 DIAGNOSIS — F329 Major depressive disorder, single episode, unspecified: Secondary | ICD-10-CM

## 2015-04-06 DIAGNOSIS — F0150 Vascular dementia without behavioral disturbance: Secondary | ICD-10-CM | POA: Diagnosis not present

## 2015-04-06 NOTE — Progress Notes (Signed)
MRN: [id number] Name: Grant Lara  Sex: male Age: 74 y.o. DOB: 05-Aug-1940  [place] #: Grant Lara Facility/Room:226 Level Of Care: SNF Provider: Inocencio Lara D Emergency Contacts: Extended Emergency Contact Information Primary Emergency Contact: Grant Lara Address: [address]          [place],  [address] Grant Lara Phone: [phone] Mobile Phone: [phone] Relation: Spouse Secondary Emergency Contact: Grant Lara          Grant Lara, Grant Lara Phone: [phone] Relation: Daughter  Code Status:   Allergies: Review of patient's allergies indicates no known allergies.  Chief Complaint  Patient presents with  . New Admit To SNF    HPI: Patient is 74 y.o. male with dementia who is being admitted to SNF with weakness.This is a chronic problem. The current episode started more than several  week ago. The problem occurs constantly. The problem has been gradually worsening. Pertinent negatives include no chest pain, no headaches and no shortness of breath. Nothing aggravates the symptoms. Nothing relieves the symptoms. He has tried nothing for the symptoms. Pt's spouse has had help at home but can't do it any longer.Pt is being admitted for residential care. While at SNF pt will be followed for Dementia, tx with aricept, chronic DVT, tx with xarelto and HLD, tx with lipitor.  Past Medical History  Diagnosis Date  . Hypertension   . Dilated cardiomyopathy ([place])     2/12: EF 30-35%, trivial AI, mild RAE.  EF 2014 40 -45%  . CAD (coronary artery disease)     LHC 9/05 with Dr. Einar Lara:  dLM 20-30%, LAD 85%, oD1 20-30%.  PCI:  Taxus DES to LAD; Dx jailed and tx with POBA.  Last myoview 12/10: inf scar, no ischemia, EF 29%.  . DVT (deep venous thrombosis) ([place])   . Pulmonary embolus (HCC)     chronic coumadin  . 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 PFO (patent foramen ovale)     Not mentioned on 2014 echo.  [name] HLD (hyperlipidemia)   .  Crohn's disease ([place])   . Nephrolithiasis   . Diabetes mellitus     11/05/11 "borderline; don't take medications"  . Stroke [facility]) 1993    "left arm can't hold steady; leg too"  . Arthritis     "used to have a touch in my legs"  . Memory difficulties   . Pneumonia ~ 2011    09-22-13 denies any recent SOB or breathing problems  . Swelling of both ankles      09-22-13 occ.feet, but denies pain.  . Bradycardia 06/01/2014  . Dementia     Past Surgical History  Procedure Laterality Date  . Colon surgery  1994; 1996    "for Crohn's disease"  . Appendectomy    . Implantable cardioverter defibrillator implant N/A 06/02/2014    Procedure: IMPLANTABLE CARDIOVERTER DEFIBRILLATOR IMPLANT;  Surgeon: Grant Sprang, MD;  Location: [facility] CATH LAB;  Service: Cardiovascular;  Laterality: N/A;      Medication List       This list is accurate as of: 04/06/15 11:59 PM.  Always use your most recent med list.               acetaminophen 325 MG tablet  Commonly known as:  TYLENOL  Take 2 tablets (650 mg total) by mouth every 6 (six) hours as needed for mild pain, moderate pain or fever.     ALPRAZolam 0.5 MG tablet  Commonly known as:  Duanne Moron  Take 0.5 mg by mouth daily.     atorvastatin 10 MG tablet  Commonly known as:  LIPITOR  Take 1 tablet (10 mg total) by mouth daily at 6 PM.     donepezil 10 MG tablet  Commonly known as:  ARICEPT  Take 10 mg by mouth daily.     eszopiclone 1 MG Tabs tablet  Commonly known as:  LUNESTA  Take 1 mg by mouth at bedtime as needed for sleep. for sleep     latanoprost 0.005 % ophthalmic solution  Commonly known as:  XALATAN  Place 1 drop into both eyes at bedtime.     rivaroxaban 20 MG Tabs tablet  Commonly known as:  XARELTO  Take 1 tablet (20 mg total) by mouth daily with supper.     sertraline 50 MG tablet  Commonly known as:  ZOLOFT  Take 50 mg by mouth daily.     tamsulosin 0.4 MG Caps capsule  Commonly known as:  FLOMAX  Take 1 capsule (0.4  mg total) by mouth daily.     vitamin A & D ointment  Apply 1 application topically daily as needed for dry skin.        No orders of the defined types were placed in this encounter.    Immunization History  Administered Date(s) Administered  . Influenza Whole 02/09/2010  . PPD Test 11/05/2011  . Pneumococcal Polysaccharide-23 02/19/2010    Social History  Substance Use Topics  . Smoking status: Current Some Day Smoker -- 0.50 packs/day for 53 years    Types: Cigarettes  . Smokeless tobacco: Never Used  . Alcohol Use: No    Family history is + DM2, CAD   Review of Systems  UTO from pt 2/2 dementia; nursing without concerns     Filed Vitals:   04/10/15 1207  BP: 139/76  Pulse: 66  Temp: 96.1 F (35.6 C)  Resp: 18    SpO2 Readings from Last 1 Encounters:  04/10/15 96%        Physical Exam  GENERAL APPEARANCE: Alert, non conversant,  No acute distress.  SKIN: No diaphoresis rash HEAD: Normocephalic, atraumatic  EYES: Conjunctiva/lids clear. Pupils round, reactive. EOMs intact.  EARS: External exam WNL, canals clear. Hearing grossly normal.  NOSE: No deformity or discharge.  MOUTH/THROAT: Lips w/o lesions  RESPIRATORY: Breathing is even, unlabored. Lung sounds are clear   CARDIOVASCULAR: Heart RRR no murmurs, rubs or gallops. No peripheral edema.   GASTROINTESTINAL: Abdomen is soft, non-tender, not distended w/ normal bowel sounds. GENITOURINARY: Bladder non tender, not distended  MUSCULOSKELETAL: No abnormal joints or musculature NEUROLOGIC:  Cranial nerves 2-12 grossly intact; aphasia, L side hemiparesis  PSYCHIATRIC: Mood and affect appropriate to situation with dementia, no behavioral issues  Patient Active Problem List   Diagnosis Date Noted  . BPH (benign prostatic hyperplasia) 04/10/2015  . Anxiety 04/10/2015  . FTT (failure to thrive) in adult 11/05/2014  . Mental status change 10/30/2014  . Protein-calorie malnutrition, severe ([place])  10/30/2014  . Increased urinary frequency 10/30/2014  . Faintness   . Vascular dementia 09/01/2014  . Generalized weakness 09/01/2014  . 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 Cardiac syncope   . Other specified hypotension   . Chronic systolic CHF (congestive heart failure) ([place])   . Hypotension 07/16/2014  . Hypothermia 07/16/2014  . SIRS (systemic inflammatory response syndrome) ([place]) 07/16/2014  . Pulmonary embolus ([place]) 07/16/2014  . Late effects of CVA (cerebrovascular accident) 07/16/2014  . Bradycardia 06/01/2014  .  Syncope 05/30/2014  . Stroke ([place])   . CVA (cerebral infarction)   . Hyperlipidemia   . Coronary artery disease involving native coronary artery of native heart without angina pectoris   . LVH (left ventricular hypertrophy)   . NSVT (nonsustained ventricular tachycardia) ([place]) 04/16/2014  . CKD (chronic kidney disease) stage 3, GFR 30-59 ml/min 04/16/2014  . Aphasia 04/15/2014  . Abnormal EKG   . Anemia, chronic disease 11/21/2013  . DM2 (diabetes mellitus, type 2) ([place]) 11/20/2013  . Hx pulmonary embolism 11/20/2013  . Leukocytosis 11/20/2013  . Hyperglycemia 08/22/2013  . Dehydration 08/21/2013  . ARF (acute renal failure) ([place]) 08/21/2013  . Abnormal ECG 08/21/2013  . Delirium 07/18/2013  . Small bowel obstruction ([place]) 07/13/2013  . Encounter for therapeutic drug monitoring 05/11/2013  . Crohn's disease ([place]) 02/16/2012  . Abnormal CT scan of lung 11/08/2011  . Bilateral leg and foot pain 11/06/2011  . SBO (small bowel obstruction) ([place]) 11/05/2011  . HLD (hyperlipidemia) 03/28/2011  . Pulmonary embolism ([place]) 06/05/2010  . History of embolic stroke 12/45/8099  . DVT (deep venous thrombosis) ([place]) 06/05/2010  . EMPHYSEMATOUS BLEB 04/06/2010  . C O P D 04/05/2010  . Chronic systolic heart failure ([place]) 01/10/2010  . Memory loss 04/03/2009  . CHEST PAIN 03/08/2009  . Hypertensive heart disease with CHF (congestive heart failure) ([place])  02/10/2009  . TOBACCO ABUSE 12/12/2008  . Coronary atherosclerosis 12/12/2008  . CARDIOMYOPATHY 12/12/2008  . ABNORMAL ELECTROCARDIOGRAM 12/12/2008    CBC    Component Value Date/Time   WBC 4.9 03/29/2015 1915   RBC 4.13* 03/29/2015 1915   RBC 3.58* 06/01/2014 1300   HGB 9.7* 03/29/2015 1915   HCT 32.2* 03/29/2015 1915   PLT 247 03/29/2015 1915   MCV 78.0 03/29/2015 1915   LYMPHSABS 0.9 07/27/2014 1649   MONOABS 0.5 07/27/2014 1649   EOSABS 0.0 07/27/2014 1649   BASOSABS 0.0 07/27/2014 1649    CMP     Component Value Date/Time   NA 141 03/29/2015 1915   K 4.0 03/29/2015 1915   CL 107 03/29/2015 1915   CO2 27 03/29/2015 1915   GLUCOSE 97 03/29/2015 1915   BUN 12 03/29/2015 1915   CREATININE 1.11 03/29/2015 1915   CREATININE 1.34 12/13/2011 1722   CALCIUM 8.7* 03/29/2015 1915   PROT 5.9* 03/29/2015 2130   ALBUMIN 2.7* 03/29/2015 2130   AST 18 03/29/2015 2130   ALT 15* 03/29/2015 2130   ALKPHOS 77 03/29/2015 2130   BILITOT 0.3 03/29/2015 2130   GFRNONAA >60 03/29/2015 1915   GFRAA >60 03/29/2015 1915    Lab Results  Component Value Date   HGBA1C 6.5* 05/30/2014     No results found.  Not all labs, radiology exams or other studies done during hospitalization come through on my EPIC note; however they are reviewed by me.    Assessment and Plan  Vascular dementia Progressive; continuing aricept to prevent decompensation  Depression due to dementia Not reported as unstable; cont zoloft  Pulmonary embolism  with DVT also; plan - cont xarelto  History of embolic stroke Multiple; with aphasia and L hemiaresis; plan - conr ASA 81 mg and xarelto as prophylaxis  Hypertensive heart disease with CHF (congestive heart failure) (HCC) Controlled on no meds; sometimes has hypotension; will monitor daliy  Chronic systolic CHF (congestive heart failure) Last EF 25%; pt is euvolemic on no meds; will monitor  Coronary artery disease involving native coronary  artery of native heart without angina pectoris Not reported as  unstable;no c/o CP or SOB; plan - cont ASA 81 mg daily  BPH (benign prostatic hyperplasia) No acute sx;plan - cont flomax  Hyperlipidemia Last FLP LDL 88, HDL 48 on lipitor 10 mg daily  FTT (failure to thrive) in adult Continues;pt was Hospice prior, may need to consider that in near future  Anxiety Pt has prn xanax for anxiety interfering with sleep;plan - will continue   Time spent > 45 min;> 50% of time with patient was spent reviewing records, labs, tests and studies, counseling and developing plan of care  Hennie Duos, MD

## 2015-04-10 ENCOUNTER — Encounter: Payer: Self-pay | Admitting: Internal Medicine

## 2015-04-10 DIAGNOSIS — N400 Enlarged prostate without lower urinary tract symptoms: Secondary | ICD-10-CM | POA: Insufficient documentation

## 2015-04-10 DIAGNOSIS — F418 Other specified anxiety disorders: Secondary | ICD-10-CM | POA: Insufficient documentation

## 2015-04-10 NOTE — Assessment & Plan Note (Signed)
Pt has prn xanax for anxiety interfering with sleep;plan - will continue

## 2015-04-10 NOTE — Assessment & Plan Note (Signed)
Multiple; with aphasia and L hemiaresis; plan - conr ASA 81 mg and xarelto as prophylaxis

## 2015-04-10 NOTE — Assessment & Plan Note (Signed)
Not reported as unstable;no c/o CP or SOB; plan - cont ASA 81 mg daily

## 2015-04-10 NOTE — Assessment & Plan Note (Signed)
Not reported as unstable; cont zoloft

## 2015-04-10 NOTE — Assessment & Plan Note (Signed)
No acute sx;plan - cont flomax

## 2015-04-10 NOTE — Assessment & Plan Note (Signed)
Last FLP LDL 88, HDL 48 on lipitor 10 mg daily

## 2015-04-10 NOTE — Assessment & Plan Note (Signed)
Continues;pt was Hospice prior, may need to consider that in near future

## 2015-04-10 NOTE — Assessment & Plan Note (Signed)
Controlled on no meds; sometimes has hypotension; will monitor daliy

## 2015-04-10 NOTE — Assessment & Plan Note (Signed)
Progressive; continuing aricept to prevent decompensation

## 2015-04-10 NOTE — Assessment & Plan Note (Signed)
Last EF 25%; pt is euvolemic on no meds; will monitor

## 2015-04-10 NOTE — Assessment & Plan Note (Signed)
with DVT also; plan - cont xarelto

## 2015-04-14 ENCOUNTER — Encounter: Payer: Self-pay | Admitting: *Deleted

## 2015-04-26 ENCOUNTER — Non-Acute Institutional Stay (SKILLED_NURSING_FACILITY): Payer: Medicare Other | Admitting: Adult Health

## 2015-04-26 DIAGNOSIS — M79604 Pain in right leg: Secondary | ICD-10-CM

## 2015-04-26 DIAGNOSIS — M79605 Pain in left leg: Secondary | ICD-10-CM

## 2015-05-10 ENCOUNTER — Non-Acute Institutional Stay (SKILLED_NURSING_FACILITY): Payer: Medicare Other | Admitting: Adult Health

## 2015-05-10 DIAGNOSIS — F0150 Vascular dementia without behavioral disturbance: Secondary | ICD-10-CM

## 2015-05-10 DIAGNOSIS — M79604 Pain in right leg: Secondary | ICD-10-CM

## 2015-05-10 DIAGNOSIS — M79605 Pain in left leg: Secondary | ICD-10-CM | POA: Diagnosis not present

## 2015-05-10 DIAGNOSIS — I5022 Chronic systolic (congestive) heart failure: Secondary | ICD-10-CM

## 2015-05-10 DIAGNOSIS — F329 Major depressive disorder, single episode, unspecified: Secondary | ICD-10-CM

## 2015-05-10 DIAGNOSIS — I6389 Other cerebral infarction: Secondary | ICD-10-CM

## 2015-05-10 DIAGNOSIS — I2782 Chronic pulmonary embolism: Secondary | ICD-10-CM | POA: Diagnosis not present

## 2015-05-10 DIAGNOSIS — I2510 Atherosclerotic heart disease of native coronary artery without angina pectoris: Secondary | ICD-10-CM | POA: Diagnosis not present

## 2015-05-10 DIAGNOSIS — F0393 Unspecified dementia, unspecified severity, with mood disturbance: Secondary | ICD-10-CM

## 2015-05-10 DIAGNOSIS — I638 Other cerebral infarction: Secondary | ICD-10-CM

## 2015-05-10 DIAGNOSIS — F0280 Dementia in other diseases classified elsewhere without behavioral disturbance: Secondary | ICD-10-CM

## 2015-05-14 ENCOUNTER — Encounter: Payer: Self-pay | Admitting: Adult Health

## 2015-05-14 DIAGNOSIS — M79604 Pain in right leg: Secondary | ICD-10-CM | POA: Insufficient documentation

## 2015-05-14 DIAGNOSIS — M79605 Pain in left leg: Principal | ICD-10-CM

## 2015-05-14 MED ORDER — ACETAMINOPHEN 500 MG PO TABS: 1000.0000 mg | ORAL_TABLET | Freq: Two times a day (BID) | ORAL | Status: DC

## 2015-05-14 NOTE — Progress Notes (Signed)
Patient ID: Grant Lara, male   DOB: 06/15/1940, 75 y.o.   MRN: [id number]    Facility:  [facility]       No Known Allergies  Chief Complaint  Patient presents with  . Acute Visit    pain management     HPI:  Staff reports that he is having pain. He cannot fully participate in the hpi or ros; but states that his legs and feet hurt. The nursing staff is concerned that his pain is not being adequately managed. He is not taking any pain medications at this time. There are reports of changes in his appetite.   Past Medical History  Diagnosis Date  . Hypertension   . Dilated cardiomyopathy ([place])     2/12: EF 30-35%, trivial AI, mild RAE.  EF 2014 40 -45%  . CAD (coronary artery disease)     LHC 9/05 with Dr. Einar Gip:  dLM 20-30%, LAD 85%, oD1 20-30%.  PCI:  Taxus DES to LAD; Dx jailed and tx with POBA.  Last myoview 12/10: inf scar, no ischemia, EF 29%.  . DVT (deep venous thrombosis) ([place])   . Pulmonary embolus (HCC)     chronic coumadin  . 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 PFO (patent foramen ovale)     Not mentioned on 2014 echo.  [name] HLD (hyperlipidemia)   . Crohn's disease ([place])   . Nephrolithiasis   . Diabetes mellitus     11/05/11 "borderline; don't take medications"  . Stroke [facility]) 1993    "left arm can't hold steady; leg too"  . Arthritis     "used to have a touch in my legs"  . Memory difficulties   . Pneumonia ~ 2011    09-22-13 denies any recent SOB or breathing problems  . Swelling of both ankles      09-22-13 occ.feet, but denies pain.  . Bradycardia 06/01/2014  . Dementia     Past Surgical History  Procedure Laterality Date  . Colon surgery  1994; 1996    "for Crohn's disease"  . Appendectomy    . Implantable cardioverter defibrillator implant N/A 06/02/2014    Procedure: IMPLANTABLE CARDIOVERTER DEFIBRILLATOR IMPLANT;  Surgeon: [name], MD;  Location: [facility] CATH LAB;  Service: Cardiovascular;  Laterality: N/A;    VITAL SIGNS BP 139/76 mmHg  Pulse 66  Wt  119 lb (53.978 kg)  SpO2 96%  Patient's Medications  New Prescriptions   No medications on file  Previous Medications   ACETAMINOPHEN (TYLENOL) 325 MG TABLET    Take 2 tablets (650 mg total) by mouth every 6 (six) hours as needed for mild pain, moderate pain or fever.   ALPRAZOLAM (XANAX) 0.5 MG TABLET    Take 0.5 mg by mouth daily.   ATORVASTATIN (LIPITOR) 10 MG TABLET    Take 1 tablet (10 mg total) by mouth daily at 6 PM.   DONEPEZIL (ARICEPT) 10 MG TABLET    Take 10 mg by mouth daily.   ESZOPICLONE (LUNESTA) 1 MG TABS TABLET    Take 1 mg by mouth at bedtime as needed for sleep. for sleep   LATANOPROST (XALATAN) 0.005 % OPHTHALMIC SOLUTION    Place 1 drop into both eyes at bedtime.   LORATADINE (CLARITIN) 10 MG TABLET    Take 10 mg by mouth daily as needed for allergies.   RIVAROXABAN (XARELTO) 20 MG TABS TABLET    Take 1 tablet (20 mg total) by mouth daily with supper.   SERTRALINE (ZOLOFT) 50  MG TABLET    Take 50 mg by mouth daily.    TAMSULOSIN (FLOMAX) 0.4 MG CAPS CAPSULE    Take 1 capsule (0.4 mg total) by mouth daily.   VITAMINS A & D (VITAMIN A & D) OINTMENT    Apply 1 application topically daily as needed for dry skin.  Modified Medications   No medications on file  Discontinued Medications   No medications on file     SIGNIFICANT DIAGNOSTIC EXAMS   LABS REVIEWED:   03-29-15: wbc 4.9; hgb 9.7; hct 32.2; mcv 78.0; plt 247; glucose 97; bun 12; creat 1.11; k+ 4.0; [id number]     Review of Systems  Unable to perform ROS: dementia    Physical Exam  Constitutional: No distress.  Frail   Eyes: Conjunctivae are normal.  Neck: Neck supple. No JVD present. No thyromegaly present.  Cardiovascular: Normal rate, regular rhythm and intact distal pulses.   Respiratory: Effort normal and breath sounds normal. No respiratory distress. He has no wheezes.  GI: Soft. Bowel sounds are normal. He exhibits no distension. There is no tenderness.  Musculoskeletal: He exhibits no edema.    Left hemiparesis  Lymphadenopathy:    He has no cervical adenopathy.  Neurological: He is alert.  Skin: Skin is warm and dry. He is not diaphoretic.  Psychiatric: He has a normal mood and affect.      ASSESSMENT/ PLAN:  1.  Bilateral lower extremity pain: will begin him on tylenol 1 gm twice daily for pain and will monitor his status and will make further adjustments as indicated.      Ok Edwards NP [facility]  Contact [phone] Monday through Friday 8am- 5pm  After hours call [phone]

## 2015-06-01 ENCOUNTER — Encounter: Payer: Self-pay | Admitting: Internal Medicine

## 2015-06-01 NOTE — Progress Notes (Signed)
MRN: [id number] Name: Grant Lara  Sex: male Age: 75 y.o. DOB: 02-10-1941  [place] #:  Facility/Room: Level Of Care: SNF Provider: Inocencio Homes D Emergency Contacts: Extended Emergency Contact Information Primary Emergency Contact: Kinnett,[name] Address: [address]          [place], [place] [address] [name] of [place] Phone: [phone] Mobile Phone: [phone] Relation: Spouse Secondary Emergency Contact: [name],[name]          [place], [place] of [place] Mobile Phone: [phone] Relation: Daughter  Code Status:   Allergies: Review of patient's allergies indicates no known allergies.  No chief complaint on file.   HPI: Patient is 75 y.o. male who   Past Medical History  Diagnosis Date  . Hypertension   . Dilated cardiomyopathy ([place])     2/12: EF 30-35%, trivial AI, mild RAE.  EF 2014 40 -45%  . CAD (coronary artery disease)     LHC 9/05 with Dr. Einar Gip:  dLM 20-30%, LAD 85%, oD1 20-30%.  PCI:  Taxus DES to LAD; Dx jailed and tx with POBA.  Last myoview 12/10: inf scar, no ischemia, EF 29%.  . DVT (deep venous thrombosis) ([place])   . Pulmonary embolus (H[facility])     chronic coumadin  . 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 PFO (patent foramen ovale)     Not mentioned on 2014 echo.  [name] HLD (hyperlipidemia)   . Crohn's disease ([place])   . Nephrolithiasis   . Diabetes mellitus     11/05/11 "borderline; don't take medications"  . Stroke [facility]) 1993    "left arm can't hold steady; leg too"  . Arthritis     "used to have a touch in my legs"  . Memory difficulties   . Pneumonia ~ 2011    09-22-13 denies any recent SOB or breathing problems  . Swelling of both ankles      09-22-13 occ.feet, but denies pain.  . Bradycardia 06/01/2014  . Dementia     Past Surgical History  Procedure Laterality Date  . Colon surgery  1994; 1996    "for Crohn's disease"  . Appendectomy    . Implantable cardioverter defibrillator implant N/A 06/02/2014    Procedure: IMPLANTABLE  CARDIOVERTER DEFIBRILLATOR IMPLANT;  Surgeon: [name], MD;  Location: [facility] CATH LAB;  Service: Cardiovascular;  Laterality: N/A;      Medication List       This list is accurate as of: 06/01/15 12:26 PM.  Always use your most recent med list.               acetaminophen 500 MG tablet  Commonly known as:  TYLENOL  Take 2 tablets (1,000 mg total) by mouth 2 (two) times daily.     ALPRAZolam 0.5 MG tablet  Commonly known as:  XANAX  Take 0.5 mg by mouth daily.     atorvastatin 10 MG tablet  Commonly known as:  LIPITOR  Take 1 tablet (10 mg total) by mouth daily at 6 PM.     donepezil 10 MG tablet  Commonly known as:  ARICEPT  Take 10 mg by mouth daily.     eszopiclone 1 MG Tabs tablet  Commonly known as:  LUNESTA  Take 1 mg by mouth at bedtime as needed for sleep. for sleep     latanoprost 0.005 % ophthalmic solution  Commonly known as:  XALATAN  Place 1 drop into both eyes at bedtime.     loratadine 10 MG tablet  Commonly known  as:  CLARITIN  Take 10 mg by mouth daily as needed for allergies.     rivaroxaban 20 MG Tabs tablet  Commonly known as:  XARELTO  Take 1 tablet (20 mg total) by mouth daily with supper.     sertraline 50 MG tablet  Commonly known as:  ZOLOFT  Take 50 mg by mouth daily.     tamsulosin 0.4 MG Caps capsule  Commonly known as:  FLOMAX  Take 1 capsule (0.4 mg total) by mouth daily.     vitamin A & D ointment  Apply 1 application topically daily as needed for dry skin.        No orders of the defined types were placed in this encounter.    Immunization History  Administered Date(s) Administered  . Influenza Whole 02/09/2010  . PPD Test 11/05/2011  . Pneumococcal Polysaccharide-23 02/19/2010    Social History  Substance Use Topics  . Smoking status: Current Some Day Smoker -- 0.50 packs/day for 53 years    Types: Cigarettes  . Smokeless tobacco: Never Used  . Alcohol Use: No    Review of Systems  DATA OBTAINED: from  patient, nurse, medical record, family member GENERAL:  no fevers, fatigue, appetite changes SKIN: No itching, rash HEENT: No complaint RESPIRATORY: No cough, wheezing, SOB CARDIAC: No chest pain, palpitations, lower extremity edema  GI: No abdominal pain, No N/V/D or constipation, No heartburn or reflux  GU: No dysuria, frequency or urgency, or incontinence  MUSCULOSKELETAL: No unrelieved bone/joint pain NEUROLOGIC: No headache, dizziness  PSYCHIATRIC: No overt anxiety or sadness  There were no vitals filed for this visit.  Physical Exam  GENERAL APPEARANCE: Alert, conversant, No acute distress  SKIN: No diaphoresis rash, or wounds HEENT: Unremarkable RESPIRATORY: Breathing is even, unlabored. Lung sounds are clear   CARDIOVASCULAR: Heart RRR no murmurs, rubs or gallops. No peripheral edema  GASTROINTESTINAL: Abdomen is soft, non-tender, not distended w/ normal bowel sounds.  GENITOURINARY: Bladder non tender, not distended  MUSCULOSKELETAL: No abnormal joints or musculature NEUROLOGIC: Cranial nerves 2-12 grossly intact. Moves all extremities PSYCHIATRIC: Mood and affect appropriate to situation, no behavioral issues  Patient Active Problem List   Diagnosis Date Noted  . Bilateral lower extremity pain 05/14/2015  . BPH (benign prostatic hyperplasia) 04/10/2015  . Anxiety 04/10/2015  . FTT (failure to thrive) in adult 11/05/2014  . Mental status change 10/30/2014  . Protein-calorie malnutrition, severe ([place]) 10/30/2014  . Increased urinary frequency 10/30/2014  . Faintness   . Vascular dementia 09/01/2014  . Generalized weakness 09/01/2014  . 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 Cardiac syncope   . Other specified hypotension   . Chronic systolic CHF (congestive heart failure) ([place])   . Hypotension 07/16/2014  . Hypothermia 07/16/2014  . SIRS (systemic inflammatory response syndrome) ([place]) 07/16/2014  . Pulmonary embolus ([place]) 07/16/2014  . Late effects of CVA  (cerebrovascular accident) 07/16/2014  . Bradycardia 06/01/2014  . Syncope 05/30/2014  . Stroke ([place])   . CVA (cerebral infarction)   . Hyperlipidemia   . Coronary artery disease involving native coronary artery of native heart without angina pectoris   . LVH (left ventricular hypertrophy)   . NSVT (nonsustained ventricular tachycardia) ([place]) 04/16/2014  . CKD (chronic kidney disease) stage 3, GFR 30-59 ml/min 04/16/2014  . Aphasia 04/15/2014  . Abnormal EKG   . Anemia, chronic disease 11/21/2013  . DM2 (diabetes mellitus, type 2) ([place]) 11/20/2013  . Hx pulmonary embolism 11/20/2013  . Leukocytosis 11/20/2013  .  Hyperglycemia 08/22/2013  . Dehydration 08/21/2013  . ARF (acute renal failure) ([place]) 08/21/2013  . Abnormal ECG 08/21/2013  . Delirium 07/18/2013  . Small bowel obstruction ([place]) 07/13/2013  . Encounter for therapeutic drug monitoring 05/11/2013  . Crohn's disease ([place]) 02/16/2012  . Abnormal CT scan of lung 11/08/2011  . Bilateral leg and foot pain 11/06/2011  . SBO (small bowel obstruction) ([place]) 11/05/2011  . HLD (hyperlipidemia) 03/28/2011  . Pulmonary embolism ([place]) 06/05/2010  . History of embolic stroke 45/99/7741  . DVT (deep venous thrombosis) ([place]) 06/05/2010  . EMPHYSEMATOUS BLEB 04/06/2010  . C O P D 04/05/2010  . Chronic systolic heart failure ([place]) 01/10/2010  . Memory loss 04/03/2009  . CHEST PAIN 03/08/2009  . Hypertensive heart disease with CHF (congestive heart failure) ([name]) 02/10/2009  . TOBACCO ABUSE 12/12/2008  . Coronary atherosclerosis 12/12/2008  . CARDIOMYOPATHY 12/12/2008  . ABNORMAL ELECTROCARDIOGRAM 12/12/2008    CBC    Component Value Date/Time   WBC 4.9 03/29/2015 1915   RBC 4.13* 03/29/2015 1915   RBC 3.58* 06/01/2014 1300   HGB 9.7* 03/29/2015 1915   HCT 32.2* 03/29/2015 1915   PLT 247 03/29/2015 1915   MCV 78.0 03/29/2015 1915   LYMPHSABS 0.9 07/27/2014 1649   MONOABS 0.5 07/27/2014 1649   EOSABS 0.0 07/27/2014 1649    BASOSABS 0.0 07/27/2014 1649    CMP     Component Value Date/Time   NA 141 03/29/2015 1915   K 4.0 03/29/2015 1915   CL 107 03/29/2015 1915   CO2 27 03/29/2015 1915   GLUCOSE 97 03/29/2015 1915   BUN 12 03/29/2015 1915   CREATININE 1.11 03/29/2015 1915   CREATININE 1.34 12/13/2011 1722   CALCIUM 8.7* 03/29/2015 1915   PROT 5.9* 03/29/2015 2130   ALBUMIN 2.7* 03/29/2015 2130   AST 18 03/29/2015 2130   ALT 15* 03/29/2015 2130   ALKPHOS 77 03/29/2015 2130   BILITOT 0.3 03/29/2015 2130   GFRNONAA >60 03/29/2015 1915   GFRAA >60 03/29/2015 1915    Assessment and Plan  No problem-specific assessment & plan notes found for this encounter.   Hennie Duos, MD     This encounter was created in error - please disregard.

## 2015-06-06 ENCOUNTER — Encounter: Payer: Self-pay | Admitting: Internal Medicine

## 2015-06-06 ENCOUNTER — Non-Acute Institutional Stay (SKILLED_NURSING_FACILITY): Payer: Medicare Other | Admitting: Internal Medicine

## 2015-06-06 DIAGNOSIS — M150 Primary generalized (osteo)arthritis: Secondary | ICD-10-CM

## 2015-06-06 DIAGNOSIS — F0150 Vascular dementia without behavioral disturbance: Secondary | ICD-10-CM

## 2015-06-06 DIAGNOSIS — I2782 Chronic pulmonary embolism: Secondary | ICD-10-CM | POA: Diagnosis not present

## 2015-06-06 DIAGNOSIS — M1990 Unspecified osteoarthritis, unspecified site: Secondary | ICD-10-CM | POA: Insufficient documentation

## 2015-06-06 DIAGNOSIS — M159 Polyosteoarthritis, unspecified: Secondary | ICD-10-CM

## 2015-06-06 NOTE — Progress Notes (Signed)
MRN: [id number] Name: Grant Lara  Sex: male Age: 75 y.o. DOB: Jan 05, 1941  [place] #: [name] Facility/Room:128 Level Of Care: SNF Provider: Inocencio Homes D Emergency Contacts: Extended Emergency Contact Information Primary Emergency Contact: Castelo,[name] Address: [address]          [place], [place] [address] [name] of [place] Phone: [phone] Mobile Phone: [phone] Relation: Spouse Secondary Emergency Contact: [name],[name]          [place], [place] of [place] Phone: [phone] Relation: Daughter  Code Status:   Allergies: Review of patient's allergies indicates no known allergies.  Chief Complaint  Patient presents with  . Medical Management of Chronic Issues    HPI: Patient is 75 y.o. male who is being seen for routine issues od PE, dementia and OA.  Past Medical History  Diagnosis Date  . Hypertension   . Dilated cardiomyopathy ([place])     2/12: EF 30-35%, trivial AI, mild RAE.  EF 2014 40 -45%  . CAD (coronary artery disease)     LHC 9/05 with Dr. Einar Gip:  dLM 20-30%, LAD 85%, oD1 20-30%.  PCI:  Taxus DES to LAD; Dx jailed and tx with POBA.  Last myoview 12/10: inf scar, no ischemia, EF 29%.  . DVT (deep venous thrombosis) ([place])   . Pulmonary embolus (HCC)     chronic coumadin  . 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 PFO (patent foramen ovale)     Not mentioned on 2014 echo.  [name] HLD (hyperlipidemia)   . Crohn's disease ([place])   . Nephrolithiasis   . Diabetes mellitus     11/05/11 "borderline; don't take medications"  . Stroke [facility]) 1993    "left arm can't hold steady; leg too"  . Arthritis     "used to have a touch in my legs"  . Memory difficulties   . Pneumonia ~ 2011    09-22-13 denies any recent SOB or breathing problems  . Swelling of both ankles      09-22-13 occ.feet, but denies pain.  . Bradycardia 06/01/2014  . Dementia     Past Surgical History  Procedure Laterality Date  . Colon surgery  1994; 1996    "for Crohn's  disease"  . Appendectomy    . Implantable cardioverter defibrillator implant N/A 06/02/2014    Procedure: IMPLANTABLE CARDIOVERTER DEFIBRILLATOR IMPLANT;  Surgeon: [name], MD;  Location: [facility] CATH LAB;  Service: Cardiovascular;  Laterality: N/A;      Medication List       This list is accurate as of: 06/06/15  8:42 PM.  Always use your most recent med list.               acetaminophen 500 MG tablet  Commonly known as:  TYLENOL  Take 2 tablets (1,000 mg total) by mouth 2 (two) times daily.     ALPRAZolam 0.5 MG tablet  Commonly known as:  XANAX  Take 0.5 mg by mouth daily.     atorvastatin 10 MG tablet  Commonly known as:  LIPITOR  Take 1 tablet (10 mg total) by mouth daily at 6 PM.     donepezil 10 MG tablet  Commonly known as:  ARICEPT  Take 10 mg by mouth daily.     eszopiclone 1 MG Tabs tablet  Commonly known as:  LUNESTA  Take 1 mg by mouth at bedtime as needed for sleep. for sleep     latanoprost 0.005 % ophthalmic solution  Commonly known as:  Ivin Poot  Place 1 drop into both eyes at bedtime.     loratadine 10 MG tablet  Commonly known as:  CLARITIN  Take 10 mg by mouth daily as needed for allergies.     rivaroxaban 20 MG Tabs tablet  Commonly known as:  XARELTO  Take 1 tablet (20 mg total) by mouth daily with supper.     sertraline 50 MG tablet  Commonly known as:  ZOLOFT  Take 50 mg by mouth daily.     tamsulosin 0.4 MG Caps capsule  Commonly known as:  FLOMAX  Take 1 capsule (0.4 mg total) by mouth daily.     vitamin A & D ointment  Apply 1 application topically daily as needed for dry skin.        No orders of the defined types were placed in this encounter.    Immunization History  Administered Date(s) Administered  . Influenza Whole 02/09/2010  . PPD Test 11/05/2011  . Pneumococcal Polysaccharide-23 02/19/2010    Social History  Substance Use Topics  . Smoking status: Current Some Day Smoker -- 0.50 packs/day for 53 years     Types: Cigarettes  . Smokeless tobacco: Never Used  . Alcohol Use: No    Review of Systems  DATA OBTAINED: from patient, nurse, GENERAL:  no fevers, fatigue, appetite changes SKIN: No itching, rash HEENT: No complaint RESPIRATORY: No cough, wheezing, SOB CARDIAC: No chest pain, palpitations, lower extremity edema  GI: No abdominal pain, No N/V/D or constipation, No heartburn or reflux  GU: No dysuria, frequency or urgency, or incontinence  MUSCULOSKELETAL: only c/owas i hurt all over;i think he means joints; he does nor appear very uncomfortable NEUROLOGIC: No headache, dizziness  PSYCHIATRIC: No overt anxiety or sadness  Filed Vitals:   06/06/15 2029  BP: 91/68  Pulse: 82  Temp: 97.1 F (36.2 C)  Resp: 16    Physical Exam  GENERAL APPEARANCE: Alert, min conversant, No acute distress, pleasant BM SKIN: No diaphoresis rash HEENT: Unremarkable RESPIRATORY: Breathing is even, unlabored. Lung sounds are clear   CARDIOVASCULAR: Heart RRR no murmurs, rubs or gallops. No peripheral edema  GASTROINTESTINAL: Abdomen is soft, non-tender, not distended w/ normal bowel sounds.  GENITOURINARY: Bladder non tender, not distended  MUSCULOSKELETAL: No abnormal joints or musculature NEUROLOGIC: Cranial nerves 2-12 grossly intact. Moves all extremities PSYCHIATRIC: Mood and affect appropriate to situation with dementia, no behavioral issues  Patient Active Problem List   Diagnosis Date Noted  . OA (osteoarthritis) 06/06/2015  . Bilateral lower extremity pain 05/14/2015  . BPH (benign prostatic hyperplasia) 04/10/2015  . Anxiety 04/10/2015  . FTT (failure to thrive) in adult 11/05/2014  . Mental status change 10/30/2014  . Protein-calorie malnutrition, severe ([place]) 10/30/2014  . Increased urinary frequency 10/30/2014  . Faintness   . Vascular dementia 09/01/2014  . Generalized weakness 09/01/2014  . 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 Cardiac syncope   . Other specified  hypotension   . Chronic systolic CHF (congestive heart failure) ([place])   . Hypotension 07/16/2014  . Hypothermia 07/16/2014  . SIRS (systemic inflammatory response syndrome) ([place]) 07/16/2014  . Pulmonary embolus ([place]) 07/16/2014  . Late effects of CVA (cerebrovascular accident) 07/16/2014  . Bradycardia 06/01/2014  . Syncope 05/30/2014  . Stroke ([place])   . CVA (cerebral infarction)   . Hyperlipidemia   . Coronary artery disease involving native coronary artery of native heart without angina pectoris   . LVH (left ventricular hypertrophy)   . NSVT (nonsustained ventricular tachycardia) ([place])  04/16/2014  . CKD (chronic kidney disease) stage 3, GFR 30-59 ml/min 04/16/2014  . Aphasia 04/15/2014  . Abnormal EKG   . Anemia, chronic disease 11/21/2013  . DM2 (diabetes mellitus, type 2) ([place]) 11/20/2013  . Hx pulmonary embolism 11/20/2013  . Leukocytosis 11/20/2013  . Hyperglycemia 08/22/2013  . Dehydration 08/21/2013  . ARF (acute renal failure) ([name]) 08/21/2013  . Abnormal ECG 08/21/2013  . Delirium 07/18/2013  . Small bowel obstruction ([place]) 07/13/2013  . Encounter for therapeutic drug monitoring 05/11/2013  . Crohn's disease ([place]) 02/16/2012  . Abnormal CT scan of lung 11/08/2011  . Bilateral leg and foot pain 11/06/2011  . SBO (small bowel obstruction) ([place]) 11/05/2011  . HLD (hyperlipidemia) 03/28/2011  . Pulmonary embolism ([place]) 06/05/2010  . History of embolic stroke 41/42/3953  . DVT (deep venous thrombosis) ([place]) 06/05/2010  . EMPHYSEMATOUS BLEB 04/06/2010  . C O P D 04/05/2010  . Chronic systolic heart failure ([place]) 01/10/2010  . Memory loss 04/03/2009  . CHEST PAIN 03/08/2009  . Hypertensive heart disease with CHF (congestive heart failure) ([place]) 02/10/2009  . TOBACCO ABUSE 12/12/2008  . Coronary atherosclerosis 12/12/2008  . CARDIOMYOPATHY 12/12/2008  . ABNORMAL ELECTROCARDIOGRAM 12/12/2008    CBC    Component Value Date/Time   WBC 4.9 03/29/2015 1915   RBC  4.13* 03/29/2015 1915   RBC 3.58* 06/01/2014 1300   HGB 9.7* 03/29/2015 1915   HCT 32.2* 03/29/2015 1915   PLT 247 03/29/2015 1915   MCV 78.0 03/29/2015 1915   LYMPHSABS 0.9 07/27/2014 1649   MONOABS 0.5 07/27/2014 1649   EOSABS 0.0 07/27/2014 1649   BASOSABS 0.0 07/27/2014 1649    CMP     Component Value Date/Time   NA 141 03/29/2015 1915   K 4.0 03/29/2015 1915   CL 107 03/29/2015 1915   CO2 27 03/29/2015 1915   GLUCOSE 97 03/29/2015 1915   BUN 12 03/29/2015 1915   CREATININE 1.11 03/29/2015 1915   CREATININE 1.34 12/13/2011 1722   CALCIUM 8.7* 03/29/2015 1915   PROT 5.9* 03/29/2015 2130   ALBUMIN 2.7* 03/29/2015 2130   AST 18 03/29/2015 2130   ALT 15* 03/29/2015 2130   ALKPHOS 77 03/29/2015 2130   BILITOT 0.3 03/29/2015 2130   GFRNONAA >60 03/29/2015 1915   GFRAA >60 03/29/2015 1915    Assessment and Plan  Pulmonary embolism With DVT as well; was on coumadin prior, now xarelto;cont xarelto  Vascular dementia Chronic and progressive; cont aricept  OA (osteoarthritis) I think his all over pain is joint pain ;pt regularly gets tylenol BID    Hennie Duos, MD

## 2015-06-06 NOTE — Assessment & Plan Note (Signed)
Chronic and progressive; cont aricept

## 2015-06-06 NOTE — Assessment & Plan Note (Signed)
I think his all over pain is joint pain ;pt regularly gets tylenol BID

## 2015-06-06 NOTE — Assessment & Plan Note (Signed)
With DVT as well; was on coumadin prior, now xarelto;cont xarelto

## 2015-06-19 ENCOUNTER — Encounter: Payer: Self-pay | Admitting: Internal Medicine

## 2015-06-19 ENCOUNTER — Non-Acute Institutional Stay (SKILLED_NURSING_FACILITY): Payer: Medicare Other | Admitting: Internal Medicine

## 2015-06-19 DIAGNOSIS — Z049 Encounter for examination and observation for unspecified reason: Secondary | ICD-10-CM | POA: Diagnosis not present

## 2015-06-19 NOTE — Progress Notes (Signed)
MRN: [id number] Name: Grant Lara  Sex: male Age: 75 y.o. DOB: May 02, 1940  [place] #: [name] Facility/Room: 128 Level Of Care: SNF Provider: Inocencio Homes D Emergency Contacts: Extended Emergency Contact Information Primary Emergency Contact: Mocarski,[name] Address: [address]          [place], [place] [address] [name] of [place] Phone: [phone] Mobile Phone: [phone] Relation: Spouse Secondary Emergency Contact: [name],[name]          [place], [place] of [place] Phone: [phone] Relation: Daughter  Code Status:   Allergies: Review of patient's allergies indicates no known allergies.  Chief Complaint  Patient presents with  . Acute Visit    HPI: Patient is 75 y.o. male who nursing asked me to see because they think he might have bedbugs. The weekend nurse reported she found 2 bugs in the shower at his feet.. Of course they did not keep the bugs. The pt denies any rash or itching. Pt denies any sx at all. Nursing today have reported no sx.  Past Medical History  Diagnosis Date  . Hypertension   . Dilated cardiomyopathy ([place])     2/12: EF 30-35%, trivial AI, mild RAE.  EF 2014 40 -45%  . CAD (coronary artery disease)     LHC 9/05 with Dr. Einar Gip:  dLM 20-30%, LAD 85%, oD1 20-30%.  PCI:  Taxus DES to LAD; Dx jailed and tx with POBA.  Last myoview 12/10: inf scar, no ischemia, EF 29%.  . DVT (deep venous thrombosis) ([place])   . Pulmonary embolus (HCC)     chronic coumadin  . 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 PFO (patent foramen ovale)     Not mentioned on 2014 echo.  [name] HLD (hyperlipidemia)   . Crohn's disease ([place])   . Nephrolithiasis   . Diabetes mellitus     11/05/11 "borderline; don't take medications"  . Stroke [facility]) 1993    "left arm can't hold steady; leg too"  . Arthritis     "used to have a touch in my legs"  . Memory difficulties   . Pneumonia ~ 2011    09-22-13 denies any recent SOB or breathing problems  . Swelling of both  ankles      09-22-13 occ.feet, but denies pain.  . Bradycardia 06/01/2014  . Dementia     Past Surgical History  Procedure Laterality Date  . Colon surgery  1994; 1996    "for Crohn's disease"  . Appendectomy    . Implantable cardioverter defibrillator implant N/A 06/02/2014    Procedure: IMPLANTABLE CARDIOVERTER DEFIBRILLATOR IMPLANT;  Surgeon: [name], MD;  Location: [facility] CATH LAB;  Service: Cardiovascular;  Laterality: N/A;      Medication List       This list is accurate as of: 06/19/15 11:59 PM.  Always use your most recent med list.               acetaminophen 500 MG tablet  Commonly known as:  TYLENOL  Take 2 tablets (1,000 mg total) by mouth 2 (two) times daily.     ALPRAZolam 0.5 MG tablet  Commonly known as:  XANAX  Take 0.5 mg by mouth daily.     atorvastatin 10 MG tablet  Commonly known as:  LIPITOR  Take 1 tablet (10 mg total) by mouth daily at 6 PM.     donepezil 10 MG tablet  Commonly known as:  ARICEPT  Take 10 mg by mouth daily.     eszopiclone  1 MG Tabs tablet  Commonly known as:  LUNESTA  Take 1 mg by mouth at bedtime as needed for sleep. for sleep     latanoprost 0.005 % ophthalmic solution  Commonly known as:  XALATAN  Place 1 drop into both eyes at bedtime.     loratadine 10 MG tablet  Commonly known as:  CLARITIN  Take 10 mg by mouth daily as needed for allergies.     rivaroxaban 20 MG Tabs tablet  Commonly known as:  XARELTO  Take 1 tablet (20 mg total) by mouth daily with supper.     sertraline 50 MG tablet  Commonly known as:  ZOLOFT  Take 50 mg by mouth daily.     tamsulosin 0.4 MG Caps capsule  Commonly known as:  FLOMAX  Take 1 capsule (0.4 mg total) by mouth daily.     vitamin A & D ointment  Apply 1 application topically daily as needed for dry skin.        No orders of the defined types were placed in this encounter.    Immunization History  Administered Date(s) Administered  . Influenza Whole 02/09/2010  .  PPD Test 11/05/2011  . Pneumococcal Polysaccharide-23 02/19/2010    Social History  Substance Use Topics  . Smoking status: Current Some Day Smoker -- 0.50 packs/day for 53 years    Types: Cigarettes  . Smokeless tobacco: Never Used  . Alcohol Use: No    Review of Systems  Pt with dementia but denies itching.rash; nursing as per HPI    Filed Vitals:   06/19/15 1643  BP: 91/68  Pulse: 80  Temp: 97.1 F (36.2 C)  Resp: 18    Physical Exam  GENERAL APPEARANCE: Alert, mod conversant, No acute distress  SKIN: no rash or wounds;nothing that looks like bites, nothing that looks like an infestation on skin surfaces esp hands, between fingers, toes and at waistline HEENT: Unremarkable RESPIRATORY: Breathing is even, unlabored. Lung sounds are clear   CARDIOVASCULAR: Heart RRR no murmurs, rubs or gallops. No peripheral edema  GASTROINTESTINAL: Abdomen is soft, non-tender, not distended w/ normal bowel sounds.  GENITOURINARY: Bladder non tender, not distended  MUSCULOSKELETAL: No abnormal joints or musculature NEUROLOGIC: Cranial nerves 2-12 grossly intact. Moves all extremities PSYCHIATRIC: Mood and affect appropriate to situation with dementia, no behavioral issues  Patient Active Problem List   Diagnosis Date Noted  . OA (osteoarthritis) 06/06/2015  . Bilateral lower extremity pain 05/14/2015  . BPH (benign prostatic hyperplasia) 04/10/2015  . Anxiety 04/10/2015  . FTT (failure to thrive) in adult 11/05/2014  . Mental status change 10/30/2014  . Protein-calorie malnutrition, severe ([place]) 10/30/2014  . Increased urinary frequency 10/30/2014  . Faintness   . Vascular dementia 09/01/2014  . Generalized weakness 09/01/2014  . 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 Cardiac syncope   . Other specified hypotension   . Chronic systolic CHF (congestive heart failure) ([place])   . Hypotension 07/16/2014  . Hypothermia 07/16/2014  . SIRS (systemic inflammatory response syndrome)  ([place]) 07/16/2014  . Pulmonary embolus ([place]) 07/16/2014  . Late effects of CVA (cerebrovascular accident) 07/16/2014  . Bradycardia 06/01/2014  . Syncope 05/30/2014  . Stroke ([place])   . CVA (cerebral infarction)   . Hyperlipidemia   . Coronary artery disease involving native coronary artery of native heart without angina pectoris   . LVH (left ventricular hypertrophy)   . NSVT (nonsustained ventricular tachycardia) ([place]) 04/16/2014  . CKD (chronic kidney disease) stage 3, GFR  30-59 ml/min 04/16/2014  . Aphasia 04/15/2014  . Abnormal EKG   . Anemia, chronic disease 11/21/2013  . DM2 (diabetes mellitus, type 2) ([place]) 11/20/2013  . Hx pulmonary embolism 11/20/2013  . Leukocytosis 11/20/2013  . Hyperglycemia 08/22/2013  . Dehydration 08/21/2013  . ARF (acute renal failure) ([place]) 08/21/2013  . Abnormal ECG 08/21/2013  . Delirium 07/18/2013  . Small bowel obstruction ([place]) 07/13/2013  . Encounter for therapeutic drug monitoring 05/11/2013  . Crohn's disease ([place]) 02/16/2012  . Abnormal CT scan of lung 11/08/2011  . Bilateral leg and foot pain 11/06/2011  . SBO (small bowel obstruction) ([place]) 11/05/2011  . HLD (hyperlipidemia) 03/28/2011  . Pulmonary embolism ([place]) 06/05/2010  . History of embolic stroke 98/26/4158  . DVT (deep venous thrombosis) ([place]) 06/05/2010  . EMPHYSEMATOUS BLEB 04/06/2010  . C O P D 04/05/2010  . Chronic systolic heart failure ([place]) 01/10/2010  . Memory loss 04/03/2009  . CHEST PAIN 03/08/2009  . Hypertensive heart disease with CHF (congestive heart failure) ([place]) 02/10/2009  . TOBACCO ABUSE 12/12/2008  . Coronary atherosclerosis 12/12/2008  . CARDIOMYOPATHY 12/12/2008  . ABNORMAL ELECTROCARDIOGRAM 12/12/2008    CBC    Component Value Date/Time   WBC 4.9 03/29/2015 1915   RBC 4.13* 03/29/2015 1915   RBC 3.58* 06/01/2014 1300   HGB 9.7* 03/29/2015 1915   HCT 32.2* 03/29/2015 1915   PLT 247 03/29/2015 1915   MCV 78.0 03/29/2015 1915   LYMPHSABS 0.9  07/27/2014 1649   MONOABS 0.5 07/27/2014 1649   EOSABS 0.0 07/27/2014 1649   BASOSABS 0.0 07/27/2014 1649    CMP     Component Value Date/Time   NA 141 03/29/2015 1915   K 4.0 03/29/2015 1915   CL 107 03/29/2015 1915   CO2 27 03/29/2015 1915   GLUCOSE 97 03/29/2015 1915   BUN 12 03/29/2015 1915   CREATININE 1.11 03/29/2015 1915   CREATININE 1.34 12/13/2011 1722   CALCIUM 8.7* 03/29/2015 1915   PROT 5.9* 03/29/2015 2130   ALBUMIN 2.7* 03/29/2015 2130   AST 18 03/29/2015 2130   ALT 15* 03/29/2015 2130   ALKPHOS 77 03/29/2015 2130   BILITOT 0.3 03/29/2015 2130   GFRNONAA >60 03/29/2015 1915   GFRAA >60 03/29/2015 1915    Assessment and Plan  EVALUATION FOR POSSIBLE BEDBUGS OR LICE- I see no bites on pt or other lesion that could be a bite from a bedbug ; no signs of lice/scabies on pt's skin; the need to look for bedbug infestation is the job of an Conservation officer, nature, and the job of the DON to initiate  Hennie Duos, MD

## 2015-06-20 ENCOUNTER — Encounter: Payer: Self-pay | Admitting: Internal Medicine

## 2015-07-03 NOTE — Progress Notes (Signed)
Patient ID: Grant Lara, male   DOB: Feb 26, 1941, 75 y.o.   MRN: [id number]   Facility:  [facility]       No Known Allergies  Chief Complaint  Patient presents with  . Medical Management of Chronic Issues    HPI:    Past Medical History  Diagnosis Date  . Hypertension   . Dilated cardiomyopathy ([place])     2/12: EF 30-35%, trivial AI, mild RAE.  EF 2014 40 -45%  . CAD (coronary artery disease)     LHC 9/05 with Dr. Einar Gip:  dLM 20-30%, LAD 85%, oD1 20-30%.  PCI:  Taxus DES to LAD; Dx jailed and tx with POBA.  Last myoview 12/10: inf scar, no ischemia, EF 29%.  . DVT (deep venous thrombosis) ([place])   . Pulmonary embolus (HCC)     chronic coumadin  . 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 PFO (patent foramen ovale)     Not mentioned on 2014 echo.  [name] HLD (hyperlipidemia)   . Crohn's disease ([place])   . Nephrolithiasis   . Diabetes mellitus     11/05/11 "borderline; don't take medications"  . Stroke [facility]) 1993    "left arm can't hold steady; leg too"  . Arthritis     "used to have a touch in my legs"  . Memory difficulties   . Pneumonia ~ 2011    09-22-13 denies any recent SOB or breathing problems  . Swelling of both ankles      09-22-13 occ.feet, but denies pain.  . Bradycardia 06/01/2014  . Dementia     Past Surgical History  Procedure Laterality Date  . Colon surgery  1994; 1996    "for Crohn's disease"  . Appendectomy    . Implantable cardioverter defibrillator implant N/A 06/02/2014    Procedure: IMPLANTABLE CARDIOVERTER DEFIBRILLATOR IMPLANT;  Surgeon: [name], MD;  Location: [facility] CATH LAB;  Service: Cardiovascular;  Laterality: N/A;    VITAL SIGNS BP 139/76 mmHg  Pulse 66  Wt 119 lb (53.978 kg)  SpO2 96%  Patient's Medications  New Prescriptions   No medications on file  Previous Medications   ACETAMINOPHEN (TYLENOL) 500 MG TABLET    Take 2 tablets (1,000 mg total) by mouth 2 (two) times daily.   ALPRAZOLAM (XANAX) 0.5 MG TABLET    Take 0.5 mg by mouth daily.     ATORVASTATIN (LIPITOR) 10 MG TABLET    Take 1 tablet (10 mg total) by mouth daily at 6 PM.   DONEPEZIL (ARICEPT) 10 MG TABLET    Take 10 mg by mouth daily.   ESZOPICLONE (LUNESTA) 1 MG TABS TABLET    Take 1 mg by mouth at bedtime as needed for sleep. for sleep   LATANOPROST (XALATAN) 0.005 % OPHTHALMIC SOLUTION    Place 1 drop into both eyes at bedtime.   LORATADINE (CLARITIN) 10 MG TABLET    Take 10 mg by mouth daily as needed for allergies.   RIVAROXABAN (XARELTO) 20 MG TABS TABLET    Take 1 tablet (20 mg total) by mouth daily with supper.   SERTRALINE (ZOLOFT) 50 MG TABLET    Take 50 mg by mouth daily.    TAMSULOSIN (FLOMAX) 0.4 MG CAPS CAPSULE    Take 1 capsule (0.4 mg total) by mouth daily.   VITAMINS A & D (VITAMIN A & D) OINTMENT    Apply 1 application topically daily as needed for dry skin.  Modified Medications   No medications on file  Discontinued  Medications   No medications on file     SIGNIFICANT DIAGNOSTIC EXAMS   LABS REVIEWED:   03-29-15: wbc 4.9; hgb 9.7; hct 32.2; mcv 78.0; plt 247; glucose 97; bun 12; creat 1.11; k+ 4.0; na++141     Review of Systems  Unable to perform ROS: dementia    Physical Exam  Constitutional: No distress.  Frail   Eyes: Conjunctivae are normal.  Neck: Neck supple. No JVD present. No thyromegaly present.  Cardiovascular: Normal rate, regular rhythm and intact distal pulses.   Respiratory: Effort normal and breath sounds normal. No respiratory distress. He has no wheezes.  GI: Soft. Bowel sounds are normal. He exhibits no distension. There is no tenderness.  Musculoskeletal: He exhibits no edema.  Left hemiparesis  Lymphadenopathy:    He has no cervical adenopathy.  Neurological: He is alert.  Skin: Skin is warm and dry. He is not diaphoretic.  Psychiatric: He has a normal mood and affect.      ASSESSMENT/ PLAN:  1.  Bilateral lower extremity pain: will begin him on tylenol 1 gm twice daily for pain and will monitor his  status and will make further adjustments as indicated.   2. Dyslipidemia: will continue lipitor 10 mg daily   3. Dementia: without significant change; his current weight is 119 pounds; will continue aricept 10 mg nightly   4. BPH: will continue flomax 0.4 mg daily   5. Chronic diastoic heart failure: is currently not on medications; will not make changes will monitor  6. PE: is stable will continue xarelto 20 mg daily   7. CVA: is neurologically stable will continue xarelto 20 mg daily   8. Depression: will continue zoloft 50 mg daily and takes xanax 0.5 mg nightly for anxiety   9. CAD: no indications of chest pain present; will continue asa 81 mg daily  10. Glaucoma: will continue xalatan to both eyes.      Ok Edwards NP [facility]  Contact [phone] Monday through Friday 8am- 5pm  After hours call [phone]

## 2015-07-10 ENCOUNTER — Non-Acute Institutional Stay (SKILLED_NURSING_FACILITY): Payer: Medicare Other | Admitting: Internal Medicine

## 2015-07-10 DIAGNOSIS — N400 Enlarged prostate without lower urinary tract symptoms: Secondary | ICD-10-CM | POA: Diagnosis not present

## 2015-07-10 DIAGNOSIS — I2510 Atherosclerotic heart disease of native coronary artery without angina pectoris: Secondary | ICD-10-CM | POA: Diagnosis not present

## 2015-07-10 DIAGNOSIS — F329 Major depressive disorder, single episode, unspecified: Secondary | ICD-10-CM

## 2015-07-10 DIAGNOSIS — F32A Depression, unspecified: Secondary | ICD-10-CM

## 2015-07-15 ENCOUNTER — Non-Acute Institutional Stay (SKILLED_NURSING_FACILITY): Payer: Medicare Other | Admitting: Internal Medicine

## 2015-07-15 ENCOUNTER — Encounter: Payer: Self-pay | Admitting: Internal Medicine

## 2015-07-15 DIAGNOSIS — N400 Enlarged prostate without lower urinary tract symptoms: Secondary | ICD-10-CM

## 2015-07-15 DIAGNOSIS — F329 Major depressive disorder, single episode, unspecified: Secondary | ICD-10-CM | POA: Insufficient documentation

## 2015-07-15 DIAGNOSIS — I2510 Atherosclerotic heart disease of native coronary artery without angina pectoris: Secondary | ICD-10-CM | POA: Diagnosis not present

## 2015-07-15 DIAGNOSIS — F32A Depression, unspecified: Secondary | ICD-10-CM

## 2015-07-15 NOTE — Progress Notes (Signed)
TORIN MODICA (MR# [id number])      Progress Notes Info    Author Note Status Last Update User Last Update Date/Time   Hennie Duos, MD Signed Hennie Duos, MD 07/15/2015 9:39 PM    Progress Notes    Expand All Collapse All   MRN: [id number] Name: Grant Lara  Sex: male Age: 75 y.o. DOB: 08/09/1940  [place] #: [name] Facility/Room: Level Of Care: SNF Provider: Inocencio Homes D Emergency Contacts: Extended Emergency Contact Information Primary Emergency Contact: Hogenson,[name] Address: [address]  [place], [place] [address] [name] of [place] Phone: [phone] Mobile Phone: [phone] Relation: Spouse Secondary Emergency Contact: [name],[name]  [place], [place] of [place] Phone: [phone] Relation: Daughter  Code Status:   Allergies: Review of patient's allergies indicates no known allergies.  Chief Complaint  Patient presents with  . Medical Management of Chronic Issues    HPI: Patient is 75 y.o. male who is being seen for routine issues of CAD, BPH and depression.  Past Medical History  Diagnosis Date  . Hypertension   . Dilated cardiomyopathy ([place])     2/12: EF 30-35%, trivial AI, mild RAE. EF 2014 40 -45%  . CAD (coronary artery disease)     LHC 9/05 with Dr. Einar Gip: dLM 20-30%, LAD 85%, oD1 20-30%. PCI: Taxus DES to LAD; Dx jailed and tx with POBA. Last myoview 12/10: inf scar, no ischemia, EF 29%.  . DVT (deep venous thrombosis) ([place])   . Pulmonary embolus (HCC)     chronic coumadin  . 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 PFO (patent foramen ovale)     Not mentioned on 2014 echo.  [name] HLD (hyperlipidemia)   . Crohn's disease ([place])   . Nephrolithiasis   . Diabetes mellitus     11/05/11 "borderline; don't take medications"  . Stroke [facility]) 1993    "left arm can't hold steady; leg too"  . Arthritis     "used to have a touch in my  legs"  . Memory difficulties   . Pneumonia ~ 2011    09-22-13 denies any recent SOB or breathing problems  . Swelling of both ankles     09-22-13 occ.feet, but denies pain.  . Bradycardia 06/01/2014  . Dementia     Past Surgical History  Procedure Laterality Date  . Colon surgery  1994; 1996    "for Crohn's disease"  . Appendectomy    . Implantable cardioverter defibrillator implant N/A 06/02/2014    Procedure: IMPLANTABLE CARDIOVERTER DEFIBRILLATOR IMPLANT; Surgeon: [name], MD; Location: [facility] CATH LAB; Service: Cardiovascular; Laterality: N/A;      Medication List       This list is accurate as of: 07/15/15 9:37 PM. Always use your most recent med list.              acetaminophen 500 MG tablet  Commonly known as: TYLENOL  Take 2 tablets (1,000 mg total) by mouth 2 (two) times daily.     ALPRAZolam 0.5 MG tablet  Commonly known as: XANAX  Take 0.5 mg by mouth daily.     atorvastatin 10 MG tablet  Commonly known as: LIPITOR  Take 1 tablet (10 mg total) by mouth daily at 6 PM.     donepezil 10 MG tablet  Commonly known as: ARICEPT  Take 10 mg by mouth daily.     eszopiclone 1 MG Tabs tablet  Commonly known as: LUNESTA  Take 1 mg by mouth at bedtime as  needed for sleep. for sleep     latanoprost 0.005 % ophthalmic solution  Commonly known as: XALATAN  Place 1 drop into both eyes at bedtime.     loratadine 10 MG tablet  Commonly known as: CLARITIN  Take 10 mg by mouth daily as needed for allergies.     rivaroxaban 20 MG Tabs tablet  Commonly known as: XARELTO  Take 1 tablet (20 mg total) by mouth daily with supper.     sertraline 50 MG tablet  Commonly known as: ZOLOFT  Take 50 mg by mouth daily.     tamsulosin 0.4 MG Caps capsule  Commonly known as: FLOMAX  Take 1 capsule (0.4 mg total) by mouth daily.     vitamin A & D  ointment  Apply 1 application topically daily as needed for dry skin.        No orders of the defined types were placed in this encounter.   Immunization History  Administered Date(s) Administered  . Influenza Whole 02/09/2010  . PPD Test 11/05/2011  . Pneumococcal Polysaccharide-23 02/19/2010    Social History  Substance Use Topics  . Smoking status: Current Some Day Smoker -- 0.50 packs/day for 53 years    Types: Cigarettes  . Smokeless tobacco: Never Used  . Alcohol Use: No    Review of Systems UTO 2/2 dementia; nursing -without concerns   Filed Vitals:   07/15/15 2126  BP: 91/68  Pulse: 82  Temp: 97.1 F (36.2 C)  Resp: 16    Physical Exam  GENERAL APPEARANCE: Alert, min conversant, No acute distress  SKIN: No diaphoresis rash, or wounds HEENT: Unremarkable RESPIRATORY: Breathing is even, unlabored. Lung sounds are clear  CARDIOVASCULAR: Heart RRR no murmurs, rubs or gallops. No peripheral edema  GASTROINTESTINAL: Abdomen is soft, non-tender, not distended w/ normal bowel sounds.  GENITOURINARY: Bladder non tender, not distended  MUSCULOSKELETAL: frail NEUROLOGIC: Cranial nerves 2-12 grossly intactl; L side weakness PSYCHIATRIC: dementiA, no behavioral issues  Patient Active Problem List   Diagnosis Date Noted  . Depression 07/15/2015  . OA (osteoarthritis) 06/06/2015  . Bilateral lower extremity pain 05/14/2015  . BPH (benign prostatic hyperplasia) 04/10/2015  . Anxiety 04/10/2015  . FTT (failure to thrive) in adult 11/05/2014  . Mental status change 10/30/2014  . Protein-calorie malnutrition, severe ([place]) 10/30/2014  . Increased urinary frequency 10/30/2014  . Faintness   . Vascular dementia 09/01/2014  . Generalized weakness 09/01/2014  . 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 Cardiac syncope   . Other specified hypotension   . Chronic  systolic CHF (congestive heart failure) ([place])   . Hypotension 07/16/2014  . Hypothermia 07/16/2014  . SIRS (systemic inflammatory response syndrome) ([place]) 07/16/2014  . Pulmonary embolus ([place]) 07/16/2014  . Late effects of CVA (cerebrovascular accident) 07/16/2014  . Bradycardia 06/01/2014  . Syncope 05/30/2014  . Stroke ([place])   . CVA (cerebral infarction)   . Hyperlipidemia   . Coronary artery disease involving native coronary artery of native heart without angina pectoris   . LVH (left ventricular hypertrophy)   . NSVT (nonsustained ventricular tachycardia) ([place]) 04/16/2014  . CKD (chronic kidney disease) stage 3, GFR 30-59 ml/min 04/16/2014  . Aphasia 04/15/2014  . Abnormal EKG   . Anemia, chronic disease 11/21/2013  . DM2 (diabetes mellitus, type 2) ([place]) 11/20/2013  . Hx pulmonary embolism 11/20/2013  . Leukocytosis 11/20/2013  . Hyperglycemia 08/22/2013  . Dehydration 08/21/2013  . ARF (acute renal failure) ([place]) 08/21/2013  . Abnormal ECG 08/21/2013  .  Delirium 07/18/2013  . Small bowel obstruction ([place]) 07/13/2013  . Encounter for therapeutic drug monitoring 05/11/2013  . Crohn's disease ([place]) 02/16/2012  . Abnormal CT scan of lung 11/08/2011  . Bilateral leg and foot pain 11/06/2011  . SBO (small bowel obstruction) ([place]) 11/05/2011  . HLD (hyperlipidemia) 03/28/2011  . Pulmonary embolism ([place]) 06/05/2010  . History of embolic stroke 11/57/2620  . DVT (deep venous thrombosis) ([place]) 06/05/2010  . EMPHYSEMATOUS BLEB 04/06/2010  . C O P D 04/05/2010  . Chronic systolic heart failure ([place]) 01/10/2010  . Memory loss 04/03/2009  . CHEST PAIN 03/08/2009  . Hypertensive heart disease with CHF (congestive heart failure) ([place]) 02/10/2009  . TOBACCO ABUSE 12/12/2008  . Coronary atherosclerosis 12/12/2008  . CARDIOMYOPATHY 12/12/2008  .  ABNORMAL ELECTROCARDIOGRAM 12/12/2008    CBC  Labs (Brief)       Component Value Date/Time   WBC 4.9 03/29/2015 1915   RBC 4.13* 03/29/2015 1915   RBC 3.58* 06/01/2014 1300   HGB 9.7* 03/29/2015 1915   HCT 32.2* 03/29/2015 1915   PLT 247 03/29/2015 1915   MCV 78.0 03/29/2015 1915   LYMPHSABS 0.9 07/27/2014 1649   MONOABS 0.5 07/27/2014 1649   EOSABS 0.0 07/27/2014 1649   BASOSABS 0.0 07/27/2014 1649      CMP  Labs (Brief)       Component Value Date/Time   NA 141 03/29/2015 1915   K 4.0 03/29/2015 1915   CL 107 03/29/2015 1915   CO2 27 03/29/2015 1915   GLUCOSE 97 03/29/2015 1915   BUN 12 03/29/2015 1915   CREATININE 1.11 03/29/2015 1915   CREATININE 1.34 12/13/2011 1722   CALCIUM 8.7* 03/29/2015 1915   PROT 5.9* 03/29/2015 2130   ALBUMIN 2.7* 03/29/2015 2130   AST 18 03/29/2015 2130   ALT 15* 03/29/2015 2130   ALKPHOS 77 03/29/2015 2130   BILITOT 0.3 03/29/2015 2130   GFRNONAA >60 03/29/2015 1915   GFRAA >60 03/29/2015 1915      Assessment and Plan  Coronary artery disease involving native coronary artery of native heart without angina pectoris no indications of chest pain present; will continue asa 81 mg daily   BPH (benign prostatic hyperplasia) will continue flomax 0.4 mg daily    Depression will continue zoloft 50 mg daily and takes xanax 0.5 mg nightly for anxiety     Hennie Duos, MD

## 2015-07-15 NOTE — Progress Notes (Signed)
MRN: [id number] Name: Grant Lara  Sex: male Age: 75 y.o. DOB: 1941/01/21  [place] #: [name] Facility/Room: Level Of Care: SNF Provider: Inocencio Homes D Emergency Contacts: Extended Emergency Contact Information Primary Emergency Contact: Carter,[name] Address: [address]          [place], [place] [address] [name] of [place] Phone: [phone] Mobile Phone: [phone] Relation: Spouse Secondary Emergency Contact: [name],[name]          [place], [place] of [place] Phone: [phone] Relation: Daughter  Code Status:   Allergies: Review of patient's allergies indicates no known allergies.  Chief Complaint  Patient presents with  . Medical Management of Chronic Issues    HPI: Patient is 75 y.o. male who is being seen for routine issues of CAD, BPH and depression.  Past Medical History  Diagnosis Date  . Hypertension   . Dilated cardiomyopathy ([place])     2/12: EF 30-35%, trivial AI, mild RAE.  EF 2014 40 -45%  . CAD (coronary artery disease)     LHC 9/05 with Dr. Einar Gip:  dLM 20-30%, LAD 85%, oD1 20-30%.  PCI:  Taxus DES to LAD; Dx jailed and tx with POBA.  Last myoview 12/10: inf scar, no ischemia, EF 29%.  . DVT (deep venous thrombosis) ([place])   . Pulmonary embolus (H[facility])     chronic coumadin  . 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 PFO (patent foramen ovale)     Not mentioned on 2014 echo.  [name] HLD (hyperlipidemia)   . Crohn's disease ([place])   . Nephrolithiasis   . Diabetes mellitus     11/05/11 "borderline; don't take medications"  . Stroke [facility]) 1993    "left arm can't hold steady; leg too"  . Arthritis     "used to have a touch in my legs"  . Memory difficulties   . Pneumonia ~ 2011    09-22-13 denies any recent SOB or breathing problems  . Swelling of both ankles      09-22-13 occ.feet, but denies pain.  . Bradycardia 06/01/2014  . Dementia     Past Surgical History  Procedure Laterality Date  . Colon surgery  1994; 1996    "for Crohn's  disease"  . Appendectomy    . Implantable cardioverter defibrillator implant N/A 06/02/2014    Procedure: IMPLANTABLE CARDIOVERTER DEFIBRILLATOR IMPLANT;  Surgeon: [name], MD;  Location: [facility] CATH LAB;  Service: Cardiovascular;  Laterality: N/A;      Medication List       This list is accurate as of: 07/15/15  9:37 PM.  Always use your most recent med list.               acetaminophen 500 MG tablet  Commonly known as:  TYLENOL  Take 2 tablets (1,000 mg total) by mouth 2 (two) times daily.     ALPRAZolam 0.5 MG tablet  Commonly known as:  XANAX  Take 0.5 mg by mouth daily.     atorvastatin 10 MG tablet  Commonly known as:  LIPITOR  Take 1 tablet (10 mg total) by mouth daily at 6 PM.     donepezil 10 MG tablet  Commonly known as:  ARICEPT  Take 10 mg by mouth daily.     eszopiclone 1 MG Tabs tablet  Commonly known as:  LUNESTA  Take 1 mg by mouth at bedtime as needed for sleep. for sleep     latanoprost 0.005 % ophthalmic solution  Commonly known as:  Ivin Poot  Place 1 drop into both eyes at bedtime.     loratadine 10 MG tablet  Commonly known as:  CLARITIN  Take 10 mg by mouth daily as needed for allergies.     rivaroxaban 20 MG Tabs tablet  Commonly known as:  XARELTO  Take 1 tablet (20 mg total) by mouth daily with supper.     sertraline 50 MG tablet  Commonly known as:  ZOLOFT  Take 50 mg by mouth daily.     tamsulosin 0.4 MG Caps capsule  Commonly known as:  FLOMAX  Take 1 capsule (0.4 mg total) by mouth daily.     vitamin A & D ointment  Apply 1 application topically daily as needed for dry skin.        No orders of the defined types were placed in this encounter.    Immunization History  Administered Date(s) Administered  . Influenza Whole 02/09/2010  . PPD Test 11/05/2011  . Pneumococcal Polysaccharide-23 02/19/2010    Social History  Substance Use Topics  . Smoking status: Current Some Day Smoker -- 0.50 packs/day for 53 years     Types: Cigarettes  . Smokeless tobacco: Never Used  . Alcohol Use: No    Review of Systems  UTO 2/2 dementia; nursing -without concerns   Filed Vitals:   07/15/15 2126  BP: 91/68  Pulse: 82  Temp: 97.1 F (36.2 C)  Resp: 16    Physical Exam  GENERAL APPEARANCE: Alert, min conversant, No acute distress  SKIN: No diaphoresis rash, or wounds HEENT: Unremarkable RESPIRATORY: Breathing is even, unlabored. Lung sounds are clear   CARDIOVASCULAR: Heart RRR no murmurs, rubs or gallops. No peripheral edema  GASTROINTESTINAL: Abdomen is soft, non-tender, not distended w/ normal bowel sounds.  GENITOURINARY: Bladder non tender, not distended  MUSCULOSKELETAL: frail NEUROLOGIC: Cranial nerves 2-12 grossly intactl;  L side weakness PSYCHIATRIC: dementiA, no behavioral issues  Patient Active Problem List   Diagnosis Date Noted  . Depression 07/15/2015  . OA (osteoarthritis) 06/06/2015  . Bilateral lower extremity pain 05/14/2015  . BPH (benign prostatic hyperplasia) 04/10/2015  . Anxiety 04/10/2015  . FTT (failure to thrive) in adult 11/05/2014  . Mental status change 10/30/2014  . Protein-calorie malnutrition, severe ([place]) 10/30/2014  . Increased urinary frequency 10/30/2014  . Faintness   . Vascular dementia 09/01/2014  . Generalized weakness 09/01/2014  . 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 Cardiac syncope   . Other specified hypotension   . Chronic systolic CHF (congestive heart failure) ([place])   . Hypotension 07/16/2014  . Hypothermia 07/16/2014  . SIRS (systemic inflammatory response syndrome) ([place]) 07/16/2014  . Pulmonary embolus ([place]) 07/16/2014  . Late effects of CVA (cerebrovascular accident) 07/16/2014  . Bradycardia 06/01/2014  . Syncope 05/30/2014  . Stroke ([place])   . CVA (cerebral infarction)   . Hyperlipidemia   . Coronary artery disease involving native coronary artery of native heart without angina pectoris   . LVH (left ventricular hypertrophy)   .  NSVT (nonsustained ventricular tachycardia) ([place]) 04/16/2014  . CKD (chronic kidney disease) stage 3, GFR 30-59 ml/min 04/16/2014  . Aphasia 04/15/2014  . Abnormal EKG   . Anemia, chronic disease 11/21/2013  . DM2 (diabetes mellitus, type 2) ([place]) 11/20/2013  . Hx pulmonary embolism 11/20/2013  . Leukocytosis 11/20/2013  . Hyperglycemia 08/22/2013  . Dehydration 08/21/2013  . ARF (acute renal failure) ([place]) 08/21/2013  . Abnormal ECG 08/21/2013  . Delirium 07/18/2013  . Small bowel obstruction ([place]) 07/13/2013  .  Encounter for therapeutic drug monitoring 05/11/2013  . Crohn's disease ([place]) 02/16/2012  . Abnormal CT scan of lung 11/08/2011  . Bilateral leg and foot pain 11/06/2011  . SBO (small bowel obstruction) ([place]) 11/05/2011  . HLD (hyperlipidemia) 03/28/2011  . Pulmonary embolism ([place]) 06/05/2010  . History of embolic stroke 16/60/6004  . DVT (deep venous thrombosis) ([place]) 06/05/2010  . EMPHYSEMATOUS BLEB 04/06/2010  . C O P D 04/05/2010  . Chronic systolic heart failure ([place]) 01/10/2010  . Memory loss 04/03/2009  . CHEST PAIN 03/08/2009  . Hypertensive heart disease with CHF (congestive heart failure) ([place]) 02/10/2009  . TOBACCO ABUSE 12/12/2008  . Coronary atherosclerosis 12/12/2008  . CARDIOMYOPATHY 12/12/2008  . ABNORMAL ELECTROCARDIOGRAM 12/12/2008    CBC    Component Value Date/Time   WBC 4.9 03/29/2015 1915   RBC 4.13* 03/29/2015 1915   RBC 3.58* 06/01/2014 1300   HGB 9.7* 03/29/2015 1915   HCT 32.2* 03/29/2015 1915   PLT 247 03/29/2015 1915   MCV 78.0 03/29/2015 1915   LYMPHSABS 0.9 07/27/2014 1649   MONOABS 0.5 07/27/2014 1649   EOSABS 0.0 07/27/2014 1649   BASOSABS 0.0 07/27/2014 1649    CMP     Component Value Date/Time   NA 141 03/29/2015 1915   K 4.0 03/29/2015 1915   CL 107 03/29/2015 1915   CO2 27 03/29/2015 1915   GLUCOSE 97 03/29/2015 1915   BUN 12 03/29/2015 1915   CREATININE 1.11 03/29/2015 1915   CREATININE 1.34 12/13/2011 1722    CALCIUM 8.7* 03/29/2015 1915   PROT 5.9* 03/29/2015 2130   ALBUMIN 2.7* 03/29/2015 2130   AST 18 03/29/2015 2130   ALT 15* 03/29/2015 2130   ALKPHOS 77 03/29/2015 2130   BILITOT 0.3 03/29/2015 2130   GFRNONAA >60 03/29/2015 1915   GFRAA >60 03/29/2015 1915    Assessment and Plan  Coronary artery disease involving native coronary artery of native heart without angina pectoris no indications of chest pain present; will continue asa 81 mg daily   BPH (benign prostatic hyperplasia) will continue flomax 0.4 mg daily    Depression will continue zoloft 50 mg daily and takes xanax 0.5 mg nightly for anxiety     Hennie Duos, MD

## 2015-07-15 NOTE — Assessment & Plan Note (Signed)
will continue flomax 0.4 mg daily

## 2015-07-15 NOTE — Assessment & Plan Note (Signed)
will continue zoloft 50 mg daily and takes xanax 0.5 mg nightly for anxiety

## 2015-07-15 NOTE — Addendum Note (Signed)
Addended by: Inocencio Homes D on: 07/15/2015 09:49 PM   Modules accepted: Level of Service, SmartSet

## 2015-07-15 NOTE — Assessment & Plan Note (Signed)
no indications of chest pain present; will continue asa 81 mg daily

## 2015-07-15 NOTE — Progress Notes (Signed)
This encounter was created in error - please disregard.

## 2015-08-07 ENCOUNTER — Encounter: Payer: Self-pay | Admitting: Internal Medicine

## 2015-08-07 ENCOUNTER — Non-Acute Institutional Stay (SKILLED_NURSING_FACILITY): Payer: Medicare Other | Admitting: Internal Medicine

## 2015-08-07 DIAGNOSIS — I110 Hypertensive heart disease with heart failure: Secondary | ICD-10-CM

## 2015-08-07 DIAGNOSIS — F0280 Dementia in other diseases classified elsewhere without behavioral disturbance: Secondary | ICD-10-CM | POA: Diagnosis not present

## 2015-08-07 DIAGNOSIS — I5022 Chronic systolic (congestive) heart failure: Secondary | ICD-10-CM | POA: Diagnosis not present

## 2015-08-07 DIAGNOSIS — F0393 Unspecified dementia, unspecified severity, with mood disturbance: Secondary | ICD-10-CM

## 2015-08-07 DIAGNOSIS — F329 Major depressive disorder, single episode, unspecified: Secondary | ICD-10-CM | POA: Diagnosis not present

## 2015-08-07 NOTE — Progress Notes (Signed)
MRN: [id number] Name: Grant Lara  Sex: male Age: 75 y.o. DOB: May 18, 1940  [place] #: [name] Facility/Room:128 Level Of Care: SNF Provider: Inocencio Homes MD Emergency Contacts: Extended Emergency Contact Information Primary Emergency Contact: Grant Lara,Grant Lara Address: [address]          [place], [place] [address] Grant Lara of [place] Phone: [phone] Mobile Phone: [phone] Relation: Spouse Secondary Emergency Contact: [name],Grant Lara          Grant Lara, Grant Lara of [place] Mobile Phone: [phone] Relation: Daughter  Code Status:   Allergies: Review of patient's allergies indicates no known allergies.  Chief Complaint  Patient presents with  . Medical Management of Chronic Issues    HPI: Patient is 75 y.o. male who is being seen for routine issues of HTN, CHF and depression 2/2 dementia.  Past Medical History  Diagnosis Date  . Hypertension   . Dilated cardiomyopathy ([place])     2/12: EF 30-35%, trivial AI, mild RAE.  EF 2014 40 -45%  . CAD (coronary artery disease)     LHC 9/05 with Dr. Einar Gip:  dLM 20-30%, LAD 85%, oD1 20-30%.  PCI:  Taxus DES to LAD; Dx jailed and tx with POBA.  Last myoview 12/10: inf scar, no ischemia, EF 29%.  . DVT (deep venous thrombosis) ([place])   . Pulmonary embolus (H[facility])     chronic coumadin  . 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 PFO (patent foramen ovale)     Not mentioned on 2014 echo.  [name] HLD (hyperlipidemia)   . Crohn's disease ([place])   . Nephrolithiasis   . Diabetes mellitus     11/05/11 "borderline; don't take medications"  . Stroke [facility]) 1993    "left arm can't hold steady; leg too"  . Arthritis     "used to have a touch in my legs"  . Memory difficulties   . Pneumonia ~ 2011    09-22-13 denies any recent SOB or breathing problems  . Swelling of both ankles      09-22-13 occ.feet, but denies pain.  . Bradycardia 06/01/2014  . Dementia     Past Surgical History  Procedure Laterality Date  . Colon surgery  1994; 1996     "for Crohn's disease"  . Appendectomy    . Implantable cardioverter defibrillator implant N/A 06/02/2014    Procedure: IMPLANTABLE CARDIOVERTER DEFIBRILLATOR IMPLANT;  Surgeon: [name], MD;  Location: [facility] CATH LAB;  Service: Cardiovascular;  Laterality: N/A;      Medication List       This list is accurate as of: 08/07/15 11:59 PM.  Always use your most recent med list.               acetaminophen 500 MG tablet  Commonly known as:  TYLENOL  Take 2 tablets (1,000 mg total) by mouth 2 (two) times daily.     ALPRAZolam 0.5 MG tablet  Commonly known as:  XANAX  Take 0.5 mg by mouth daily.     atorvastatin 10 MG tablet  Commonly known as:  LIPITOR  Take 1 tablet (10 mg total) by mouth daily at 6 PM.     donepezil 10 MG tablet  Commonly known as:  ARICEPT  Take 10 mg by mouth daily.     eszopiclone 1 MG Tabs tablet  Commonly known as:  LUNESTA  Take 1 mg by mouth at bedtime as needed for sleep. Reported on 08/07/2015     latanoprost 0.005 % ophthalmic solution  Commonly known as:  XALATAN  Place 1 drop into both eyes at bedtime.     loratadine 10 MG tablet  Commonly known as:  CLARITIN  Take 10 mg by mouth daily as needed for allergies.     rivaroxaban 20 MG Tabs tablet  Commonly known as:  XARELTO  Take 1 tablet (20 mg total) by mouth daily with supper.     sertraline 50 MG tablet  Commonly known as:  ZOLOFT  Take 50 mg by mouth daily.     tamsulosin 0.4 MG Caps capsule  Commonly known as:  FLOMAX  Take 1 capsule (0.4 mg total) by mouth daily.     vitamin A & D ointment  Apply 1 application topically daily as needed for dry skin.        No orders of the defined types were placed in this encounter.    Immunization History  Administered Date(s) Administered  . Influenza Whole 02/09/2010  . PPD Test 11/05/2011  . Pneumococcal Polysaccharide-23 02/19/2010    Social History  Substance Use Topics  . Smoking status: Current Some Day Smoker -- 0.50  packs/day for 53 years    Types: Cigarettes  . Smokeless tobacco: Never Used  . Alcohol Use: No    Review of Systems  UTO 2/2 dementia    Filed Vitals:   08/07/15 1525  BP: 91/68  Pulse: 82  Temp: 97.1 F (36.2 C)  Resp: 16    Physical Exam  GENERAL APPEARANCE: Alert, min  conversant, BM nicely dressed, No acute distress  SKIN: No diaphoresis rash HEENT: Unremarkable RESPIRATORY: Breathing is even, unlabored. Lung sounds are clear   CARDIOVASCULAR: Heart RRR no murmurs, rubs or gallops. No peripheral edema  GASTROINTESTINAL: Abdomen is soft, non-tender, not distended w/ normal bowel sounds.  GENITOURINARY: Bladder non tender, not distended  MUSCULOSKELETAL: No abnormal joints or musculature NEUROLOGIC: Cranial nerves 2-12 grossly intact; l side weakness PSYCHIATRIC: Mood and affect appropriate to situation, no behavioral issues  Patient Active Problem List   Diagnosis Date Noted  . Depression 07/15/2015  . OA (osteoarthritis) 06/06/2015  . Bilateral lower extremity pain 05/14/2015  . BPH (benign prostatic hyperplasia) 04/10/2015  . Anxiety 04/10/2015  . FTT (failure to thrive) in adult 11/05/2014  . Mental status change 10/30/2014  . Protein-calorie malnutrition, severe ([place]) 10/30/2014  . Increased urinary frequency 10/30/2014  . Faintness   . Vascular dementia 09/01/2014  . Generalized weakness 09/01/2014  . 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 Cardiac syncope   . Other specified hypotension   . Chronic systolic CHF (congestive heart failure) ([place])   . Hypotension 07/16/2014  . Hypothermia 07/16/2014  . SIRS (systemic inflammatory response syndrome) ([name]) 07/16/2014  . Pulmonary embolus ([place]) 07/16/2014  . Late effects of CVA (cerebrovascular accident) 07/16/2014  . Bradycardia 06/01/2014  . Syncope 05/30/2014  . Stroke ([name])   . CVA (cerebral infarction)   . Hyperlipidemia   . Coronary artery disease involving native coronary artery of native heart  without angina pectoris   . LVH (left ventricular hypertrophy)   . NSVT (nonsustained ventricular tachycardia) ([name]) 04/16/2014  . CKD (chronic kidney disease) stage 3, GFR 30-59 ml/min 04/16/2014  . Aphasia 04/15/2014  . Abnormal EKG   . Anemia, chronic disease 11/21/2013  . DM2 (diabetes mellitus, type 2) ([place]) 11/20/2013  . Hx pulmonary embolism 11/20/2013  . Leukocytosis 11/20/2013  . Hyperglycemia 08/22/2013  . Dehydration 08/21/2013  . ARF (acute renal failure) ([place]) 08/21/2013  . Abnormal ECG 08/21/2013  . Delirium  07/18/2013  . Small bowel obstruction ([place]) 07/13/2013  . Encounter for therapeutic drug monitoring 05/11/2013  . Crohn's disease ([place]) 02/16/2012  . Abnormal CT scan of lung 11/08/2011  . Bilateral leg and foot pain 11/06/2011  . SBO (small bowel obstruction) ([place]) 11/05/2011  . HLD (hyperlipidemia) 03/28/2011  . Pulmonary embolism ([place]) 06/05/2010  . History of embolic stroke 80/22/3361  . DVT (deep venous thrombosis) ([place]) 06/05/2010  . EMPHYSEMATOUS BLEB 04/06/2010  . C O P D 04/05/2010  . Chronic systolic heart failure ([place]) 01/10/2010  . Memory loss 04/03/2009  . CHEST PAIN 03/08/2009  . Hypertensive heart disease with CHF (congestive heart failure) ([place]) 02/10/2009  . TOBACCO ABUSE 12/12/2008  . Coronary atherosclerosis 12/12/2008  . CARDIOMYOPATHY 12/12/2008  . ABNORMAL ELECTROCARDIOGRAM 12/12/2008    CBC    Component Value Date/Time   WBC 4.9 03/29/2015 1915   RBC 4.13* 03/29/2015 1915   RBC 3.58* 06/01/2014 1300   HGB 9.7* 03/29/2015 1915   HCT 32.2* 03/29/2015 1915   PLT 247 03/29/2015 1915   MCV 78.0 03/29/2015 1915   LYMPHSABS 0.9 07/27/2014 1649   MONOABS 0.5 07/27/2014 1649   EOSABS 0.0 07/27/2014 1649   BASOSABS 0.0 07/27/2014 1649    CMP     Component Value Date/Time   NA 141 03/29/2015 1915   K 4.0 03/29/2015 1915   CL 107 03/29/2015 1915   CO2 27 03/29/2015 1915   GLUCOSE 97 03/29/2015 1915   BUN 12 03/29/2015 1915    CREATININE 1.11 03/29/2015 1915   CREATININE 1.34 12/13/2011 1722   CALCIUM 8.7* 03/29/2015 1915   PROT 5.9* 03/29/2015 2130   ALBUMIN 2.7* 03/29/2015 2130   AST 18 03/29/2015 2130   ALT 15* 03/29/2015 2130   ALKPHOS 77 03/29/2015 2130   BILITOT 0.3 03/29/2015 2130   GFRNONAA >60 03/29/2015 1915   GFRAA >60 03/29/2015 1915    Assessment and Plan  Hypertensive heart disease with CHF (congestive heart failure) ([place]) Pt continues to be controlled on no meds; probably needs to be terminated as a problem  Chronic systolic CHF (congestive heart failure) Pt continues to be controlled on no meds or diuretics; will continue to monitor.  Depression due to dementia Pt seems stable and content;plan =- cont zoloft 50 mg qHS    Inocencio Homes MD

## 2015-08-12 ENCOUNTER — Encounter: Payer: Self-pay | Admitting: Internal Medicine

## 2015-08-12 NOTE — Progress Notes (Signed)
This encounter was created in error - please disregard.

## 2015-08-12 NOTE — Assessment & Plan Note (Signed)
Pt continues to be controlled on no meds; probably needs to be terminated as a problem

## 2015-08-12 NOTE — Addendum Note (Signed)
Addended by: Inocencio Homes D on: 08/12/2015 07:40 PM   Modules accepted: Level of Service, SmartSet

## 2015-08-12 NOTE — Assessment & Plan Note (Signed)
Pt seems stable and content;plan =- cont zoloft 50 mg qHS

## 2015-08-12 NOTE — Assessment & Plan Note (Signed)
Pt continues to be controlled on no meds or diuretics; will continue to monitor.

## 2015-09-07 ENCOUNTER — Non-Acute Institutional Stay (SKILLED_NURSING_FACILITY): Payer: Medicare Other | Admitting: Internal Medicine

## 2015-09-07 ENCOUNTER — Encounter: Payer: Self-pay | Admitting: Internal Medicine

## 2015-09-07 DIAGNOSIS — I2782 Chronic pulmonary embolism: Secondary | ICD-10-CM | POA: Diagnosis not present

## 2015-09-07 DIAGNOSIS — N400 Enlarged prostate without lower urinary tract symptoms: Secondary | ICD-10-CM

## 2015-09-07 DIAGNOSIS — F419 Anxiety disorder, unspecified: Secondary | ICD-10-CM | POA: Diagnosis not present

## 2015-09-07 NOTE — Assessment & Plan Note (Signed)
Chronic and stable; cont xarelto 20 mg daily

## 2015-09-07 NOTE — Progress Notes (Signed)
MRN: [id number] Name: Grant Lara  Sex: male Age: 75 y.o. DOB: 1940-06-04  [place] #: [name] Facility/Room:233 A Level Of Care: SNF Provider: Noah Delaine. Sheppard Coil, MD Emergency Contacts: Extended Emergency Contact Information Primary Emergency Contact: Orf,[name] Address: [address]          [place],  [address] [name] of [place] Phone: [phone] Mobile Phone: [phone] Relation: Spouse Secondary Emergency Contact: [name],[name]          [place], [place] of [place] Mobile Phone: [phone] Relation: Daughter  Code Status: Full Code  Allergies: Review of patient's allergies indicates no known allergies.  Chief Complaint  Patient presents with  . Medical Management of Chronic Issues    Routine Visit    HPI: Patient is 75 y.o. male who is being seen for routine issues of h/o PE, BPH and anxiety.  Past Medical History  Diagnosis Date  . Hypertension   . Dilated cardiomyopathy ([place])     2/12: EF 30-35%, trivial AI, mild RAE.  EF 2014 40 -45%  . CAD (coronary artery disease)     LHC 9/05 with Dr. Einar Gip:  dLM 20-30%, LAD 85%, oD1 20-30%.  PCI:  Taxus DES to LAD; Dx jailed and tx with POBA.  Last myoview 12/10: inf scar, no ischemia, EF 29%.  . DVT (deep venous thrombosis) ([place])   . Pulmonary embolus ([facility])     chronic coumadin  . 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 PFO (patent foramen ovale)     Not mentioned on 2014 echo.  [name] HLD (hyperlipidemia)   . Crohn's disease ([place])   . Nephrolithiasis   . Diabetes mellitus     11/05/11 "borderline; don't take medications"  . Stroke [facility]) 1993    "left arm can't hold steady; leg too"  . Arthritis     "used to have a touch in my legs"  . Memory difficulties   . Pneumonia ~ 2011    09-22-13 denies any recent SOB or breathing problems  . Swelling of both ankles      09-22-13 occ.feet, but denies pain.  . Bradycardia 06/01/2014  . Dementia     Past Surgical History  Procedure Laterality Date  . Colon  surgery  1994; 1996    "for Crohn's disease"  . Appendectomy    . Implantable cardioverter defibrillator implant N/A 06/02/2014    Procedure: IMPLANTABLE CARDIOVERTER DEFIBRILLATOR IMPLANT;  Surgeon: [name], MD;  Location: [facility] CATH LAB;  Service: Cardiovascular;  Laterality: N/A;      Medication List       This list is accurate as of: 09/07/15  9:02 PM.  Always use your most recent med list.               acetaminophen 325 MG tablet  Commonly known as:  TYLENOL  Take 650 mg by mouth every 6 (six) hours as needed for mild pain or moderate pain.     acetaminophen 500 MG tablet  Commonly known as:  TYLENOL  Take 2 tablets (1,000 mg total) by mouth 2 (two) times daily.     ALPRAZolam 0.5 MG tablet  Commonly known as:  XANAX  Take 0.5 mg by mouth at bedtime as needed.     aspirin 81 MG tablet  Take 81 mg by mouth every morning.     atorvastatin 10 MG tablet  Commonly known as:  LIPITOR  Take 1 tablet (10 mg total) by mouth daily at 6 PM.     donepezil  10 MG tablet  Commonly known as:  ARICEPT  Take 10 mg by mouth at bedtime.     latanoprost 0.005 % ophthalmic solution  Commonly known as:  XALATAN  Place 1 drop into both eyes at bedtime.     loratadine 10 MG tablet  Commonly known as:  CLARITIN  Take 10 mg by mouth daily as needed for allergies.     rivaroxaban 20 MG Tabs tablet  Commonly known as:  XARELTO  Take 1 tablet (20 mg total) by mouth daily with supper.     sertraline 50 MG tablet  Commonly known as:  ZOLOFT  Take 50 mg by mouth every morning.     tamsulosin 0.4 MG Caps capsule  Commonly known as:  FLOMAX  Take 1 capsule (0.4 mg total) by mouth daily.        Meds ordered this encounter  Medications  . aspirin 81 MG tablet    Sig: Take 81 mg by mouth every morning.  [name] acetaminophen (TYLENOL) 325 MG tablet    Sig: Take 650 mg by mouth every 6 (six) hours as needed for mild pain or moderate pain.    Immunization History  Administered  Date(s) Administered  . Influenza Whole 02/09/2010  . PPD Test 11/05/2011  . Pneumococcal Polysaccharide-23 02/19/2010    Social History  Substance Use Topics  . Smoking status: Current Some Day Smoker -- 0.50 packs/day for 53 years    Types: Cigarettes  . Smokeless tobacco: Never Used  . Alcohol Use: No    Review of Systems  UTO 2/2 dementia; nursing without cncerns    Filed Vitals:   09/07/15 0946  BP: 106/54  Pulse: 80  Temp: 97.5 F (36.4 C)  Resp: 16    Physical Exam  GENERAL APPEARANCE: Alert, conversant, No acute distress  SKIN: No diaphoresis rash HEENT: Unremarkable RESPIRATORY: Breathing is even, unlabored. Lung sounds are clear   CARDIOVASCULAR: Heart RRR no murmurs, rubs or gallops. No peripheral edema  GASTROINTESTINAL: Abdomen is soft, non-tender, not distended w/ normal bowel sounds.  GENITOURINARY: Bladder non tender, not distended  MUSCULOSKELETAL: No abnormal joints or musculature NEUROLOGIC: Cranial nerves 2-12 grossly intact; L side weakness PSYCHIATRIC: Mood and affect appropriate to situation, no behavioral issues  Patient Active Problem List   Diagnosis Date Noted  . Depression 07/15/2015  . OA (osteoarthritis) 06/06/2015  . Bilateral lower extremity pain 05/14/2015  . BPH (benign prostatic hyperplasia) 04/10/2015  . Anxiety 04/10/2015  . FTT (failure to thrive) in adult 11/05/2014  . Mental status change 10/30/2014  . Protein-calorie malnutrition, severe ([place]) 10/30/2014  . Increased urinary frequency 10/30/2014  . Faintness   . Vascular dementia 09/01/2014  . Generalized weakness 09/01/2014  . 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 Cardiac syncope   . Other specified hypotension   . Chronic systolic CHF (congestive heart failure) ([place])   . Hypotension 07/16/2014  . Hypothermia 07/16/2014  . SIRS (systemic inflammatory response syndrome) ([place]) 07/16/2014  . Pulmonary embolus ([place]) 07/16/2014  . Late effects of CVA  (cerebrovascular accident) 07/16/2014  . Bradycardia 06/01/2014  . Syncope 05/30/2014  . Stroke ([place])   . CVA (cerebral infarction)   . Hyperlipidemia   . Coronary artery disease involving native coronary artery of native heart without angina pectoris   . LVH (left ventricular hypertrophy)   . NSVT (nonsustained ventricular tachycardia) ([place]) 04/16/2014  . CKD (chronic kidney disease) stage 3, GFR 30-59 ml/min 04/16/2014  . Aphasia 04/15/2014  . Abnormal EKG   .  Anemia, chronic disease 11/21/2013  . DM2 (diabetes mellitus, type 2) ([place]) 11/20/2013  . Hx pulmonary embolism 11/20/2013  . Leukocytosis 11/20/2013  . Hyperglycemia 08/22/2013  . Dehydration 08/21/2013  . ARF (acute renal failure) ([place]) 08/21/2013  . Abnormal ECG 08/21/2013  . Delirium 07/18/2013  . Small bowel obstruction ([place]) 07/13/2013  . Encounter for therapeutic drug monitoring 05/11/2013  . Crohn's disease ([place]) 02/16/2012  . Abnormal CT scan of lung 11/08/2011  . Bilateral leg and foot pain 11/06/2011  . SBO (small bowel obstruction) ([place]) 11/05/2011  . HLD (hyperlipidemia) 03/28/2011  . Pulmonary embolism ([place]) 06/05/2010  . History of embolic stroke 17/79/3903  . DVT (deep venous thrombosis) ([place]) 06/05/2010  . EMPHYSEMATOUS BLEB 04/06/2010  . C O P D 04/05/2010  . Chronic systolic heart failure ([place]) 01/10/2010  . Memory loss 04/03/2009  . CHEST PAIN 03/08/2009  . 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 TOBACCO ABUSE 12/12/2008  . Coronary atherosclerosis 12/12/2008  . CARDIOMYOPATHY 12/12/2008  . ABNORMAL ELECTROCARDIOGRAM 12/12/2008    CBC    Component Value Date/Time   WBC 4.9 03/29/2015 1915   RBC 4.13* 03/29/2015 1915   RBC 3.58* 06/01/2014 1300   HGB 9.7* 03/29/2015 1915   HCT 32.2* 03/29/2015 1915   PLT 247 03/29/2015 1915   MCV 78.0 03/29/2015 1915   LYMPHSABS 0.9 07/27/2014 1649   MONOABS 0.5 07/27/2014 1649   EOSABS 0.0 07/27/2014 1649    BASOSABS 0.0 07/27/2014 1649    CMP     Component Value Date/Time   NA 141 03/29/2015 1915   K 4.0 03/29/2015 1915   CL 107 03/29/2015 1915   CO2 27 03/29/2015 1915   GLUCOSE 97 03/29/2015 1915   BUN 12 03/29/2015 1915   CREATININE 1.11 03/29/2015 1915   CREATININE 1.34 12/13/2011 1722   CALCIUM 8.7* 03/29/2015 1915   PROT 5.9* 03/29/2015 2130   ALBUMIN 2.7* 03/29/2015 2130   AST 18 03/29/2015 2130   ALT 15* 03/29/2015 2130   ALKPHOS 77 03/29/2015 2130   BILITOT 0.3 03/29/2015 2130   GFRNONAA >60 03/29/2015 1915   GFRAA >60 03/29/2015 1915    Assessment and Plan  Pulmonary embolism Chronic and stable; cont xarelto 20 mg daily  BPH (benign prostatic hyperplasia) No reported problems with retention; cont flomax  Anxiety Chronic and stable; cont prn xanax    Noah Delaine. Sheppard Coil, MD

## 2015-09-07 NOTE — Assessment & Plan Note (Signed)
Chronic and stable; cont prn xanax

## 2015-09-07 NOTE — Assessment & Plan Note (Signed)
No reported problems with retention; cont flomax

## 2015-09-08 ENCOUNTER — Encounter (HOSPITAL_COMMUNITY): Payer: Self-pay | Admitting: *Deleted

## 2015-09-08 ENCOUNTER — Emergency Department (HOSPITAL_COMMUNITY): Payer: Medicare Other

## 2015-09-08 ENCOUNTER — Inpatient Hospital Stay (HOSPITAL_COMMUNITY): Payer: Medicare Other

## 2015-09-08 ENCOUNTER — Inpatient Hospital Stay (HOSPITAL_COMMUNITY)
Admission: EM | Admit: 2015-09-08 | Discharge: 2015-09-11 | DRG: 812 | Disposition: A | Discharge status: Skilled Nursing Facility | Payer: Medicare Other | Attending: Internal Medicine | Admitting: Internal Medicine

## 2015-09-08 DIAGNOSIS — N400 Enlarged prostate without lower urinary tract symptoms: Secondary | ICD-10-CM | POA: Diagnosis not present

## 2015-09-08 DIAGNOSIS — Z7984 Long term (current) use of oral hypoglycemic drugs: Secondary | ICD-10-CM

## 2015-09-08 DIAGNOSIS — F0150 Vascular dementia without behavioral disturbance: Secondary | ICD-10-CM | POA: Diagnosis not present

## 2015-09-08 DIAGNOSIS — Q211 Atrial septal defect: Secondary | ICD-10-CM

## 2015-09-08 DIAGNOSIS — Z86711 Personal history of pulmonary embolism: Secondary | ICD-10-CM | POA: Diagnosis not present

## 2015-09-08 DIAGNOSIS — I2510 Atherosclerotic heart disease of native coronary artery without angina pectoris: Secondary | ICD-10-CM | POA: Diagnosis present

## 2015-09-08 DIAGNOSIS — E440 Moderate protein-calorie malnutrition: Secondary | ICD-10-CM | POA: Diagnosis not present

## 2015-09-08 DIAGNOSIS — Z955 Presence of coronary angioplasty implant and graft: Secondary | ICD-10-CM | POA: Diagnosis not present

## 2015-09-08 DIAGNOSIS — E119 Type 2 diabetes mellitus without complications: Secondary | ICD-10-CM | POA: Diagnosis present

## 2015-09-08 DIAGNOSIS — D649 Anemia, unspecified: Secondary | ICD-10-CM | POA: Diagnosis present

## 2015-09-08 DIAGNOSIS — I420 Dilated cardiomyopathy: Secondary | ICD-10-CM | POA: Diagnosis not present

## 2015-09-08 DIAGNOSIS — I5022 Chronic systolic (congestive) heart failure: Secondary | ICD-10-CM | POA: Diagnosis not present

## 2015-09-08 DIAGNOSIS — Z7901 Long term (current) use of anticoagulants: Secondary | ICD-10-CM

## 2015-09-08 DIAGNOSIS — F419 Anxiety disorder, unspecified: Secondary | ICD-10-CM | POA: Diagnosis present

## 2015-09-08 DIAGNOSIS — E1100 Type 2 diabetes mellitus with hyperosmolarity without nonketotic hyperglycemic-hyperosmolar coma (NKHHC): Secondary | ICD-10-CM

## 2015-09-08 DIAGNOSIS — E860 Dehydration: Secondary | ICD-10-CM | POA: Diagnosis not present

## 2015-09-08 DIAGNOSIS — D638 Anemia in other chronic diseases classified elsewhere: Principal | ICD-10-CM | POA: Diagnosis present

## 2015-09-08 DIAGNOSIS — R627 Adult failure to thrive: Secondary | ICD-10-CM | POA: Diagnosis present

## 2015-09-08 DIAGNOSIS — D508 Other iron deficiency anemias: Secondary | ICD-10-CM

## 2015-09-08 DIAGNOSIS — R531 Weakness: Secondary | ICD-10-CM | POA: Diagnosis present

## 2015-09-08 DIAGNOSIS — E872 Acidosis: Secondary | ICD-10-CM | POA: Diagnosis present

## 2015-09-08 DIAGNOSIS — I110 Hypertensive heart disease with heart failure: Secondary | ICD-10-CM | POA: Diagnosis not present

## 2015-09-08 DIAGNOSIS — W19XXXA Unspecified fall, initial encounter: Secondary | ICD-10-CM

## 2015-09-08 DIAGNOSIS — Z9181 History of falling: Secondary | ICD-10-CM

## 2015-09-08 DIAGNOSIS — Z79899 Other long term (current) drug therapy: Secondary | ICD-10-CM

## 2015-09-08 DIAGNOSIS — Z8673 Personal history of transient ischemic attack (TIA), and cerebral infarction without residual deficits: Secondary | ICD-10-CM

## 2015-09-08 DIAGNOSIS — R413 Other amnesia: Secondary | ICD-10-CM | POA: Diagnosis present

## 2015-09-08 DIAGNOSIS — Z95810 Presence of automatic (implantable) cardiac defibrillator: Secondary | ICD-10-CM

## 2015-09-08 DIAGNOSIS — F17210 Nicotine dependence, cigarettes, uncomplicated: Secondary | ICD-10-CM | POA: Diagnosis present

## 2015-09-08 DIAGNOSIS — E785 Hyperlipidemia, unspecified: Secondary | ICD-10-CM | POA: Diagnosis present

## 2015-09-08 DIAGNOSIS — F418 Other specified anxiety disorders: Secondary | ICD-10-CM | POA: Diagnosis present

## 2015-09-08 HISTORY — DX: Heart failure, unspecified: I50.9

## 2015-09-08 HISTORY — DX: Hyperlipidemia, unspecified: E78.5

## 2015-09-08 LAB — CBC WITH DIFFERENTIAL/PLATELET
BASOS ABS: 0.1 10*3/uL (ref 0.0–0.1)
BASOS PCT: 1 %
EOS ABS: 0.1 10*3/uL (ref 0.0–0.7)
Eosinophils Relative: 1 %
HCT: 26.5 % — ABNORMAL LOW (ref 39.0–52.0)
HEMOGLOBIN: 7.3 g/dL — AB (ref 13.0–17.0)
Lymphocytes Relative: 15 %
Lymphs Abs: 1.1 10*3/uL (ref 0.7–4.0)
MCH: 19.2 pg — AB (ref 26.0–34.0)
MCHC: 27.5 g/dL — ABNORMAL LOW (ref 30.0–36.0)
MCV: 69.6 fL — ABNORMAL LOW (ref 78.0–100.0)
MONO ABS: 0.4 10*3/uL (ref 0.1–1.0)
Monocytes Relative: 5 %
NEUTROS PCT: 78 %
Neutro Abs: 5.5 10*3/uL (ref 1.7–7.7)
PLATELETS: UNDETERMINED 10*3/uL (ref 150–400)
RBC: 3.81 MIL/uL — ABNORMAL LOW (ref 4.22–5.81)
RDW: 16.5 % — ABNORMAL HIGH (ref 11.5–15.5)
WBC: 7.2 10*3/uL (ref 4.0–10.5)

## 2015-09-08 LAB — COMPREHENSIVE METABOLIC PANEL
ALBUMIN: 3 g/dL — AB (ref 3.5–5.0)
ALK PHOS: 86 U/L (ref 38–126)
ALT: 35 U/L (ref 17–63)
AST: 27 U/L (ref 15–41)
Anion gap: 8 (ref 5–15)
BUN: 15 mg/dL (ref 6–20)
CALCIUM: 8.6 mg/dL — AB (ref 8.9–10.3)
CHLORIDE: 107 mmol/L (ref 101–111)
CO2: 22 mmol/L (ref 22–32)
CREATININE: 1.31 mg/dL — AB (ref 0.61–1.24)
GFR calc Af Amer: 59 mL/min — ABNORMAL LOW (ref >60)
GFR calc non Af Amer: 51 mL/min — ABNORMAL LOW (ref >60)
GLUCOSE: 218 mg/dL — AB (ref 65–99)
Potassium: 4 mmol/L (ref 3.5–5.1)
SODIUM: 137 mmol/L (ref 135–145)
Total Bilirubin: 0.8 mg/dL (ref 0.3–1.2)
Total Protein: 6.2 g/dL — ABNORMAL LOW (ref 6.5–8.1)

## 2015-09-08 LAB — POC OCCULT BLOOD, ED: Fecal Occult Bld: NEGATIVE

## 2015-09-08 LAB — URINALYSIS, ROUTINE W REFLEX MICROSCOPIC
BILIRUBIN URINE: NEGATIVE
GLUCOSE, UA: NEGATIVE mg/dL
HGB URINE DIPSTICK: NEGATIVE
KETONES UR: NEGATIVE mg/dL
Leukocytes, UA: NEGATIVE
NITRITE: NEGATIVE
PH: 6 (ref 5.0–8.0)
Protein, ur: 100 mg/dL — AB
SPECIFIC GRAVITY, URINE: 1.025 (ref 1.005–1.030)

## 2015-09-08 LAB — URINE MICROSCOPIC-ADD ON
BACTERIA UA: NONE SEEN
SQUAMOUS EPITHELIAL / LPF: NONE SEEN

## 2015-09-08 LAB — PROTIME-INR
INR: 1.64 — ABNORMAL HIGH (ref 0.00–1.49)
Prothrombin Time: 19.4 seconds — ABNORMAL HIGH (ref 11.6–15.2)

## 2015-09-08 LAB — MAGNESIUM: Magnesium: 1.7 mg/dL (ref 1.7–2.4)

## 2015-09-08 LAB — PREPARE RBC (CROSSMATCH)

## 2015-09-08 LAB — TROPONIN I: Troponin I: <0.03 ng/mL (ref <0.031)

## 2015-09-08 LAB — LACTIC ACID, PLASMA
LACTIC ACID, VENOUS: 1.7 mmol/L (ref 0.5–2.0)
Lactic Acid, Venous: 1.6 mmol/L (ref 0.5–2.0)

## 2015-09-08 LAB — I-STAT CG4 LACTIC ACID, ED
LACTIC ACID, VENOUS: 3.93 mmol/L — AB (ref 0.5–2.0)
LACTIC ACID, VENOUS: 1.66 mmol/L (ref 0.5–2.0)

## 2015-09-08 LAB — TSH: TSH: 1.135 u[IU]/mL (ref 0.350–4.500)

## 2015-09-08 LAB — HEMOGLOBIN AND HEMATOCRIT, BLOOD
HCT: 25.9 % — ABNORMAL LOW (ref 39.0–52.0)
Hemoglobin: 7 g/dL — ABNORMAL LOW (ref 13.0–17.0)

## 2015-09-08 LAB — GLUCOSE, CAPILLARY
GLUCOSE-CAPILLARY: 160 mg/dL — AB (ref 65–99)
Glucose-Capillary: 103 mg/dL — ABNORMAL HIGH (ref 65–99)

## 2015-09-08 LAB — CK: Total CK: 42 U/L — ABNORMAL LOW (ref 49–397)

## 2015-09-08 LAB — ABO/RH: ABO/RH(D): B POS

## 2015-09-08 LAB — CBG MONITORING, ED: GLUCOSE-CAPILLARY: 223 mg/dL — AB (ref 65–99)

## 2015-09-08 LAB — AMMONIA: Ammonia: 26 umol/L (ref 9–35)

## 2015-09-08 MED ORDER — ONDANSETRON HCL 4 MG PO TABS: 4.0000 mg | ORAL_TABLET | Freq: Four times a day (QID) | ORAL | Status: DC | PRN

## 2015-09-08 MED ORDER — SODIUM CHLORIDE 0.9 % IV BOLUS (SEPSIS)
1000.0000 mL | Freq: Once | INTRAVENOUS | Status: AC
  Administered 2015-09-08: 1000 mL via INTRAVENOUS

## 2015-09-08 MED ORDER — ATORVASTATIN CALCIUM 10 MG PO TABS
10.0000 mg | ORAL_TABLET | Freq: Every day | ORAL | Status: DC
  Administered 2015-09-08 – 2015-09-10 (×3): 10 mg via ORAL
  Filled 2015-09-08 (×3): qty 1

## 2015-09-08 MED ORDER — ALPRAZOLAM 0.25 MG PO TABS: 0.2500 mg | ORAL_TABLET | Freq: Every evening | ORAL | Status: DC | PRN

## 2015-09-08 MED ORDER — INSULIN ASPART 100 UNIT/ML ~~LOC~~ SOLN
0.0000 [IU] | Freq: Three times a day (TID) | SUBCUTANEOUS | Status: DC
  Administered 2015-09-09 – 2015-09-10 (×3): 1 [IU] via SUBCUTANEOUS

## 2015-09-08 MED ORDER — FUROSEMIDE 10 MG/ML IJ SOLN
40.0000 mg | Freq: Once | INTRAMUSCULAR | Status: AC
  Administered 2015-09-08: 40 mg via INTRAVENOUS
  Filled 2015-09-08: qty 4

## 2015-09-08 MED ORDER — LATANOPROST 0.005 % OP SOLN
1.0000 [drp] | Freq: Every day | OPHTHALMIC | Status: DC
  Administered 2015-09-09 – 2015-09-10 (×2): 1 [drp] via OPHTHALMIC
  Filled 2015-09-08 (×2): qty 2.5

## 2015-09-08 MED ORDER — ONDANSETRON HCL 4 MG/2ML IJ SOLN: 4.0000 mg | Freq: Four times a day (QID) | INTRAMUSCULAR | Status: DC | PRN

## 2015-09-08 MED ORDER — TAMSULOSIN HCL 0.4 MG PO CAPS
0.4000 mg | ORAL_CAPSULE | Freq: Every day | ORAL | Status: DC
  Administered 2015-09-08 – 2015-09-11 (×4): 0.4 mg via ORAL
  Filled 2015-09-08 (×4): qty 1

## 2015-09-08 MED ORDER — DONEPEZIL HCL 10 MG PO TABS
10.0000 mg | ORAL_TABLET | Freq: Every day | ORAL | Status: DC
Start: 2015-09-08 — End: 2015-09-11
  Administered 2015-09-08 – 2015-09-10 (×3): 10 mg via ORAL
  Filled 2015-09-08 (×3): qty 1

## 2015-09-08 MED ORDER — LEVALBUTEROL HCL 0.63 MG/3ML IN NEBU: 0.6300 mg | INHALATION_SOLUTION | Freq: Four times a day (QID) | RESPIRATORY_TRACT | Status: DC | PRN

## 2015-09-08 MED ORDER — SERTRALINE HCL 50 MG PO TABS
50.0000 mg | ORAL_TABLET | ORAL | Status: DC
  Administered 2015-09-09 – 2015-09-11 (×3): 50 mg via ORAL
  Filled 2015-09-08 (×3): qty 1

## 2015-09-08 MED ORDER — SODIUM CHLORIDE 0.9 % IV SOLN: Freq: Once | INTRAVENOUS | Status: DC

## 2015-09-08 MED ORDER — ACETAMINOPHEN 325 MG PO TABS: 650.0000 mg | ORAL_TABLET | Freq: Four times a day (QID) | ORAL | Status: DC | PRN

## 2015-09-08 MED ORDER — TETRACAINE HCL 0.5 % OP SOLN: 1.0000 [drp] | Freq: Once | OPHTHALMIC | Status: DC

## 2015-09-08 MED ORDER — SODIUM CHLORIDE 0.9% FLUSH
3.0000 mL | Freq: Two times a day (BID) | INTRAVENOUS | Status: DC
  Administered 2015-09-08 – 2015-09-11 (×4): 3 mL via INTRAVENOUS

## 2015-09-08 MED ORDER — ACETAMINOPHEN 650 MG RE SUPP: 650.0000 mg | Freq: Four times a day (QID) | RECTAL | Status: DC | PRN

## 2015-09-08 MED ORDER — SODIUM CHLORIDE 0.9 % IV SOLN
INTRAVENOUS | Status: DC
  Administered 2015-09-08 – 2015-09-10 (×4): via INTRAVENOUS

## 2015-09-08 NOTE — ED Notes (Signed)
CBG 223

## 2015-09-08 NOTE — ED Notes (Signed)
Pt here via [facility] from [facility] for unwitnessed fall.  Was picked up off floor and placed on bed by staff.  Since then emesis x 3.  Hx of dementia and staff unclear to [facility] at what normal mental status.  Pupils sluggish, 2, reactive.  Redness to R ribs.  Neuro intact.  CBG 286.  VS 99/64, hr 70 (paced), rr 16, o2 96% RA.

## 2015-09-08 NOTE — H&P (Signed)
[facility] History and Physical  Grant Lara YME:[id number] DOB: 04/20/1940 DOA: 09/08/2015  Referring physician:   PCP: [name], MD   Chief Complaint: fall   HPI:  75 y.o. male with PMH of dementia, hypertension, hyperlipidemia, diabetes mellitus, depression, CAD, DVT, PE on Xarelto, PFO, chronic disease, stroke, BPH, systolic congestive heart failure (EF 20-25%), who presents with generalized weakness and increased urinary frequency brought in by EMS from [facility] for unwitnessed fall. Patient was on the floor for an unknown period of time, noted to have emesis x 3 after the episode. Because of dementia unable to obtain clear history,  . Pupils found to be sluggish, but reactive. Due to his history of dementia the patient was unable to give any history and most history is compatible some the patient's chart. No bradycardia seen on EKG. CBG 286. VS 99/64, hr 70 (paced), rr 16, o2 96% RA. . Initial lactic acid 3.93 that improved after IV fluids, initial hemoglobin 7.3 subsequently 7.0. Stool guaiac-negative 2 in the ER. Patient is on Xarelto for history of pulmonary embolism. Unable to state if the patient has had any melena or hematochezia.      Review of Systems: negative for the following   Unable to obtain history due to dementia    Past Medical History  Diagnosis Date  . Hypertension   . Dilated cardiomyopathy ([place])     2/12: EF 30-35%, trivial AI, mild RAE.  EF 2014 40 -45%  . CAD (coronary artery disease)     LHC 9/05 with Dr. Einar Gip:  dLM 20-30%, LAD 85%, oD1 20-30%.  PCI:  Taxus DES to LAD; Dx jailed and tx with POBA.  Last myoview 12/10: inf scar, no ischemia, EF 29%.  . DVT (deep venous thrombosis) ([place])   . Pulmonary embolus (HCC)     chronic coumadin  . 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 PFO (patent foramen ovale)     Not mentioned on 2014 echo.  [name] HLD (hyperlipidemia)   . Crohn's disease ([place])   . Nephrolithiasis   . Diabetes mellitus      11/05/11 "borderline; don't take medications"  . Stroke [facility]) 1993    "left arm can't hold steady; leg too"  . Arthritis     "used to have a touch in my legs"  . Memory difficulties   . Pneumonia ~ 2011    09-22-13 denies any recent SOB or breathing problems  . Swelling of both ankles      09-22-13 occ.feet, but denies pain.  . Bradycardia 06/01/2014  . Dementia   . Hyperlipidemia   . CHF (congestive heart failure) [facility])      Past Surgical History  Procedure Laterality Date  . Colon surgery  1994; 1996    "for Crohn's disease"  . Appendectomy    . Implantable cardioverter defibrillator implant N/A 06/02/2014    Procedure: IMPLANTABLE CARDIOVERTER DEFIBRILLATOR IMPLANT;  Surgeon: [name], MD;  Location: [facility] CATH LAB;  Service: Cardiovascular;  Laterality: N/A;      Social History:  reports that he has been smoking Cigarettes.  He has a 26.5 pack-year smoking history. He has never used smokeless tobacco. He reports that he does not drink alcohol or use illicit drugs.    No Known Allergies  Family History  Problem Relation Age of Onset  . Diabetes type II Mother   . CAD Father   . Diabetes type II Brother         Prior to Admission medications  Medication Sig Start Date End Date Taking? Authorizing Provider  acetaminophen (TYLENOL) 500 MG tablet Take 2 tablets (1,000 mg total) by mouth 2 (two) times daily. 05/14/15  Yes Gerlene Fee, NP  ALPRAZolam Duanne Moron) 0.5 MG tablet Take 0.5 mg by mouth at bedtime as needed.  03/28/15  Yes Historical Provider, MD  aspirin 81 MG tablet Take 81 mg by mouth every morning.   Yes Historical Provider, MD  atorvastatin (LIPITOR) 10 MG tablet Take 1 tablet (10 mg total) by mouth daily at 6 PM. 04/18/14  Yes Maryann Mikhail, DO  donepezil (ARICEPT) 10 MG tablet Take 10 mg by mouth at bedtime.  02/23/15  Yes Historical Provider, MD  latanoprost (XALATAN) 0.005 % ophthalmic solution Place 1 drop into both eyes at bedtime. 06/29/13  Yes  Historical Provider, MD  loratadine (CLARITIN) 10 MG tablet Take 10 mg by mouth daily as needed for allergies.   Yes Historical Provider, MD  rivaroxaban (XARELTO) 20 MG TABS tablet Take 1 tablet (20 mg total) by mouth daily with supper. 06/23/14  Yes Hosie Poisson, MD  sertraline (ZOLOFT) 50 MG tablet Take 50 mg by mouth every morning.  05/18/14  Yes Historical Provider, MD  tamsulosin (FLOMAX) 0.4 MG CAPS capsule Take 1 capsule (0.4 mg total) by mouth daily. 04/18/14  Yes Cristal Ford, DO     Physical Exam: Filed Vitals:   09/08/15 1615 09/08/15 1630 09/08/15 1645 09/08/15 1752  BP: 131/73 130/71 126/68 132/75  Pulse: 58 59 60 62  Temp:    98 F (36.7 C)  TempSrc:    Oral  Resp: 13 12 13 20   SpO2: 100% 100% 100% 100%      Constitutional: NAD, calm, comfortable Filed Vitals:   09/08/15 1615 09/08/15 1630 09/08/15 1645 09/08/15 1752  BP: 131/73 130/71 126/68 132/75  Pulse: 58 59 60 62  Temp:    98 F (36.7 C)  TempSrc:    Oral  Resp: 13 12 13 20   SpO2: 100% 100% 100% 100%   Eyes: PERRL, lids and conjunctivae normal, cachectic and delirious ENMT: Mucous membranes are dry. Posterior pharynx clear of any exudate or lesions.Normal dentition.  Neck: normal, supple, no masses, no thyromegaly Respiratory: clear to auscultation bilaterally, no wheezing, no crackles. Normal respiratory effort. No accessory muscle use.  Cardiovascular: Regular rate and rhythm, no murmurs / rubs / gallops. No extremity edema. 2+ pedal pulses. No carotid bruits.  Abdomen: no tenderness, no masses palpated. No hepatosplenomegaly. Bowel sounds positive.  Musculoskeletal: no clubbing / cyanosis. No joint deformity upper and lower extremities. Good ROM, no contractures. Normal muscle tone.  Skin: no rashes, lesions, ulcers. No induration Neurologic: CN 2-12 grossly intact. Sensation intact, DTR normal. Strength 5/5 in all 4.  Psychiatric: Invade judgment and insight. Confused, Normal mood. Disoriented     Labs on Admission: I have personally reviewed following labs and imaging studies  CBC:  Recent Labs Lab 09/08/15 1006 09/08/15 1607  WBC 7.2  --   NEUTROABS 5.5  --   HGB 7.3* 7.0*  HCT 26.5* 25.9*  MCV 69.6*  --   PLT PLATELET CLUMPS NOTED ON SMEAR, UNABLE TO ESTIMATE  --     Basic Metabolic Panel:  Recent Labs Lab 09/08/15 1006  NA 137  K 4.0  CL 107  CO2 22  GLUCOSE 218*  BUN 15  CREATININE 1.31*  CALCIUM 8.6*    GFR: Estimated Creatinine Clearance: 37.3 mL/min (by C-G formula based on Cr of 1.31).  Liver Function  Tests:  Recent Labs Lab 09/08/15 1006  AST 27  ALT 35  ALKPHOS 86  BILITOT 0.8  PROT 6.2*  ALBUMIN 3.0*   No results for input(s): LIPASE, AMYLASE in the last 168 hours.  Recent Labs Lab 09/08/15 1006  AMMONIA 26    Coagulation Profile:  Recent Labs Lab 09/08/15 1045  INR 1.64*   No results for input(s): DDIMER in the last 72 hours.  Cardiac Enzymes:  Recent Labs Lab 09/08/15 1006 09/08/15 1045  CKTOTAL  --  42*  TROPONINI <0.03  --     BNP (last 3 results) No results for input(s): PROBNP in the last 8760 hours.  HbA1C: No results for input(s): HGBA1C in the last 72 hours. Lab Results  Component Value Date   HGBA1C 6.5* 05/30/2014   HGBA1C 6.5* 04/16/2014   HGBA1C 10.7* 08/22/2013     CBG:  Recent Labs Lab 09/08/15 1008  GLUCAP 223*    Lipid Profile: No results for input(s): CHOL, HDL, LDLCALC, TRIG, CHOLHDL, LDLDIRECT in the last 72 hours.  Thyroid Function Tests: No results for input(s): TSH, T4TOTAL, FREET4, T3FREE, THYROIDAB in the last 72 hours.  Anemia Panel: No results for input(s): VITAMINB12, FOLATE, FERRITIN, TIBC, IRON, RETICCTPCT in the last 72 hours.  Urine analysis:    Component Value Date/Time   COLORURINE YELLOW 09/08/2015 [place] 09/08/2015 1145   LABSPEC 1.025 09/08/2015 1145   PHURINE 6.0 09/08/2015 1145   GLUCOSEU NEGATIVE 09/08/2015 1145   HGBUR  NEGATIVE 09/08/2015 1145   BILIRUBINUR NEGATIVE 09/08/2015 1145   KETONESUR NEGATIVE 09/08/2015 1145   PROTEINUR 100* 09/08/2015 1145   UROBILINOGEN 1.0 12/21/2014 1520   NITRITE NEGATIVE 09/08/2015 1145   LEUKOCYTESUR NEGATIVE 09/08/2015 1145    Sepsis Labs: @LABRCNTIP (procalcitonin:4,lacticidven:4) ) Recent Results (from the past 240 hour(s))  Blood culture (routine x 2)     Status: None (Preliminary result)   Collection Time: 09/08/15  9:57 AM  Result Value Ref Range Status   Specimen Description BLOOD RIGHT ANTECUBITAL  Final   Special Requests IN PEDIATRIC BOTTLE  3CC  Final   Culture PENDING  Incomplete   Report Status PENDING  Incomplete  Blood culture (routine x 2)     Status: None (Preliminary result)   Collection Time: 09/08/15 10:00 AM  Result Value Ref Range Status   Specimen Description BLOOD RIGHT HAND  Final   Special Requests BOTTLES DRAWN AEROBIC AND ANAEROBIC  4CC  Final   Culture PENDING  Incomplete   Report Status PENDING  Incomplete         Radiological Exams on Admission: Dg Chest 2 View  09/08/2015  CLINICAL DATA:  Shortness of breath and weakness. EXAM: CHEST  2 VIEW COMPARISON:  12/21/2014 FINDINGS: Left-sided pacemaker unchanged. Lungs are hyperexpanded without focal consolidation or effusion. Cardiomediastinal silhouette is within normal. Remainder of the exam is unchanged. IMPRESSION: No acute cardiopulmonary disease. COPD. Electronically Signed   By: Marin Olp M.D.   On: 09/08/2015 11:20   Ct Head Wo Contrast  09/08/2015  CLINICAL DATA:  Unwitnessed fall at nursing home. Initial encounter. EXAM: CT HEAD WITHOUT CONTRAST CT CERVICAL SPINE WITHOUT CONTRAST TECHNIQUE: Multidetector CT imaging of the head and cervical spine was performed following the standard protocol without intravenous contrast. Multiplanar CT image reconstructions of the cervical spine were also generated. COMPARISON:  Head CT 12/21/2014 FINDINGS: CT HEAD FINDINGS Skull and  Sinuses:Negative for fracture or hemo sinus. Benign ground-glass density in the left occipital bone that is  small and stable. Visualized orbits: Gaze to the right, nonspecific. No evidence of orbital injury. Brain: No evidence of acute infarction, hemorrhage, hydrocephalus, or mass lesion/mass effect. There is advanced chronic ischemic injury with numerous bilateral cerebellar infarcts, largest area in the inferior left cerebellum. Multiple bilateral subcortical infarcts, most discrete in the right thalamus and caudate head. Extensive fairly symmetric cortical infarct and atrophy, aligned in a watershed distribution. CT CERVICAL SPINE FINDINGS Negative for acute fracture or subluxation. No prevertebral edema. No gross cervical canal hematoma. Diffuse disc degeneration and spondylotic spurring. No indication of high-grade canal stenosis. IMPRESSION: 1. No evidence of acute intracranial or cervical spine injury. 2. Extensive chronic ischemic injury as described, stable from 12/21/2014 comparison. Electronically Signed   By: Monte Fantasia M.D.   On: 09/08/2015 11:04   Ct Cervical Spine Wo Contrast  09/08/2015  CLINICAL DATA:  Unwitnessed fall at nursing home. Initial encounter. EXAM: CT HEAD WITHOUT CONTRAST CT CERVICAL SPINE WITHOUT CONTRAST TECHNIQUE: Multidetector CT imaging of the head and cervical spine was performed following the standard protocol without intravenous contrast. Multiplanar CT image reconstructions of the cervical spine were also generated. COMPARISON:  Head CT 12/21/2014 FINDINGS: CT HEAD FINDINGS Skull and Sinuses:Negative for fracture or hemo sinus. Benign ground-glass density in the left occipital bone that is small and stable. Visualized orbits: Gaze to the right, nonspecific. No evidence of orbital injury. Brain: No evidence of acute infarction, hemorrhage, hydrocephalus, or mass lesion/mass effect. There is advanced chronic ischemic injury with numerous bilateral cerebellar infarcts,  largest area in the inferior left cerebellum. Multiple bilateral subcortical infarcts, most discrete in the right thalamus and caudate head. Extensive fairly symmetric cortical infarct and atrophy, aligned in a watershed distribution. CT CERVICAL SPINE FINDINGS Negative for acute fracture or subluxation. No prevertebral edema. No gross cervical canal hematoma. Diffuse disc degeneration and spondylotic spurring. No indication of high-grade canal stenosis. IMPRESSION: 1. No evidence of acute intracranial or cervical spine injury. 2. Extensive chronic ischemic injury as described, stable from 12/21/2014 comparison. Electronically Signed   By: Monte Fantasia M.D.   On: 09/08/2015 11:04   Dg Chest 2 View  09/08/2015  CLINICAL DATA:  Shortness of breath and weakness. EXAM: CHEST  2 VIEW COMPARISON:  12/21/2014 FINDINGS: Left-sided pacemaker unchanged. Lungs are hyperexpanded without focal consolidation or effusion. Cardiomediastinal silhouette is within normal. Remainder of the exam is unchanged. IMPRESSION: No acute cardiopulmonary disease. COPD. Electronically Signed   By: Marin Olp M.D.   On: 09/08/2015 11:20   Ct Head Wo Contrast  09/08/2015  CLINICAL DATA:  Unwitnessed fall at nursing home. Initial encounter. EXAM: CT HEAD WITHOUT CONTRAST CT CERVICAL SPINE WITHOUT CONTRAST TECHNIQUE: Multidetector CT imaging of the head and cervical spine was performed following the standard protocol without intravenous contrast. Multiplanar CT image reconstructions of the cervical spine were also generated. COMPARISON:  Head CT 12/21/2014 FINDINGS: CT HEAD FINDINGS Skull and Sinuses:Negative for fracture or hemo sinus. Benign ground-glass density in the left occipital bone that is small and stable. Visualized orbits: Gaze to the right, nonspecific. No evidence of orbital injury. Brain: No evidence of acute infarction, hemorrhage, hydrocephalus, or mass lesion/mass effect. There is advanced chronic ischemic injury with  numerous bilateral cerebellar infarcts, largest area in the inferior left cerebellum. Multiple bilateral subcortical infarcts, most discrete in the right thalamus and caudate head. Extensive fairly symmetric cortical infarct and atrophy, aligned in a watershed distribution. CT CERVICAL SPINE FINDINGS Negative for acute fracture or subluxation. No prevertebral edema. No gross  cervical canal hematoma. Diffuse disc degeneration and spondylotic spurring. No indication of high-grade canal stenosis. IMPRESSION: 1. No evidence of acute intracranial or cervical spine injury. 2. Extensive chronic ischemic injury as described, stable from 12/21/2014 comparison. Electronically Signed   By: Monte Fantasia M.D.   On: 09/08/2015 11:04   Ct Cervical Spine Wo Contrast  09/08/2015  CLINICAL DATA:  Unwitnessed fall at nursing home. Initial encounter. EXAM: CT HEAD WITHOUT CONTRAST CT CERVICAL SPINE WITHOUT CONTRAST TECHNIQUE: Multidetector CT imaging of the head and cervical spine was performed following the standard protocol without intravenous contrast. Multiplanar CT image reconstructions of the cervical spine were also generated. COMPARISON:  Head CT 12/21/2014 FINDINGS: CT HEAD FINDINGS Skull and Sinuses:Negative for fracture or hemo sinus. Benign ground-glass density in the left occipital bone that is small and stable. Visualized orbits: Gaze to the right, nonspecific. No evidence of orbital injury. Brain: No evidence of acute infarction, hemorrhage, hydrocephalus, or mass lesion/mass effect. There is advanced chronic ischemic injury with numerous bilateral cerebellar infarcts, largest area in the inferior left cerebellum. Multiple bilateral subcortical infarcts, most discrete in the right thalamus and caudate head. Extensive fairly symmetric cortical infarct and atrophy, aligned in a watershed distribution. CT CERVICAL SPINE FINDINGS Negative for acute fracture or subluxation. No prevertebral edema. No gross cervical canal  hematoma. Diffuse disc degeneration and spondylotic spurring. No indication of high-grade canal stenosis. IMPRESSION: 1. No evidence of acute intracranial or cervical spine injury. 2. Extensive chronic ischemic injury as described, stable from 12/21/2014 comparison. Electronically Signed   By: Monte Fantasia M.D.   On: 09/08/2015 11:04    EKG-sinus bradycardia    Assessment/Plan Falls -vasovagal syncope vs dehydration vs anemia Admit to telemetry, initial EKG shows normal sinus rhythm No signs of infection, negative UA, chest x-ray CT head does not show any evidence of acute intracranial or cervical spine injury, doubt CVA Consider EEG to rule out seizure PT OT evaluation  Anemia-hold Xarelto, will transfuse 2 units of packed red blood cells Continue to monitor FOBT Followed by Dr.Buccini  Had colonoscopy in 2012 that did not show any colonic mass, has a history of Crohn's disease but the colonoscopy did not show any evidence of Crohn's     Memory loss-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 with extensive chronic ischemic changes, continue Aricept    DM2 (diabetes mellitus, type 2) (HCC)-check hemoglobin A1c and start patient on sliding scale insulin   , lactic acidosis initially likely secondary to dehydration, resolved  Anxiety-continue prn Xanax at bedtime  Dilated cardiomyopathy with an EF of 20-25% based on 2-D echo in February 2016 History of coronary artery disease with prior PCI to LAD in 2015 Will place on telemetry and continue to cycle cardiac enzymes  History of pulmonary embolism chronically on Xarelto, after failing warfarin due to difficulty maintaining a therapeutic INR Given history of falls, need to reconsider long-term anticoagulation   [employer] AICD placed for primary prevention of SCD by Dr. Klein-interrogate because of recent falls     DVT prophylaxis: scd's  CODE STATUS-full code  Family Communication: No family by the bedside Emergency Contacts:  Extended Emergency Contact Information Primary Emergency Contact: Dresser,[name] Address: [address]  [place], [place] [address] [place] Phone: [phone] Mobile Phone: [phone] Relation: Spouse Secondary Emergency Contact: [name],[name]  [place], [place] Mobile Phone: [phone] Relation: Daughter  Disposition Plan:  Anticipate discharge in 2-3 days pending further workup and clinical progress  Consults called: none  Admission status:  inpatient    Total time spent 55 minutes.Greater than 50% of this time was spent in counseling, explanation of diagnosis, planning of further management, and coordination of care  [facility] MD [facility] Pager [phone]  If 7PM-7AM, please contact night-coverage [email or web address] Password [facility]  09/08/2015, 5:55 PM

## 2015-09-08 NOTE — ED Provider Notes (Signed)
CSN: [id number]     Arrival date & time 09/08/15  0945 History   First MD Initiated Contact with Patient 09/08/15 [phone]     Chief Complaint  Patient presents with  . Fall   PT IS HERE FROM [facility] B/C OF AN UNWITNESSED FALL.  PT HAS HX OF DEMENTIA AND DOES NOT REMEMBER THE FALL.  THE PT HAS HAD 3 EPISODES OF EMESIS EN ROUTE.  THE NH DOES NOT KNOW HOW LONG PT WAS ON THE GROUND.  (Consider location/radiation/quality/duration/timing/severity/associated sxs/prior Treatment) Patient is a 75 y.o. male presenting with fall. The history is provided by the EMS personnel. The history is limited by the condition of the patient.  Fall This is a new problem. Nothing aggravates the symptoms. Nothing relieves the symptoms.    Past Medical History  Diagnosis Date  . Hypertension   . Dilated cardiomyopathy ([place])     2/12: EF 30-35%, trivial AI, mild RAE.  EF 2014 40 -45%  . CAD (coronary artery disease)     LHC 9/05 with Dr. Einar Gip:  dLM 20-30%, LAD 85%, oD1 20-30%.  PCI:  Taxus DES to LAD; Dx jailed and tx with POBA.  Last myoview 12/10: inf scar, no ischemia, EF 29%.  . DVT (deep venous thrombosis) ([place])   . Pulmonary embolus ([facility])     chronic coumadin  . 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 PFO (patent foramen ovale)     Not mentioned on 2014 echo.  [name] HLD (hyperlipidemia)   . Crohn's disease ([place])   . Nephrolithiasis   . Diabetes mellitus     11/05/11 "borderline; don't take medications"  . Stroke [facility]) 1993    "left arm can't hold steady; leg too"  . Arthritis     "used to have a touch in my legs"  . Memory difficulties   . Pneumonia ~ 2011    09-22-13 denies any recent SOB or breathing problems  . Swelling of both ankles      09-22-13 occ.feet, but denies pain.  . Bradycardia 06/01/2014  . Dementia   . Hyperlipidemia   . CHF (congestive heart failure) [facility])    Past Surgical History  Procedure Laterality Date  . Colon surgery  1994; 1996    "for Crohn's disease"  . Appendectomy    .  Implantable cardioverter defibrillator implant N/A 06/02/2014    Procedure: IMPLANTABLE CARDIOVERTER DEFIBRILLATOR IMPLANT;  Surgeon: [name], MD;  Location: [facility] CATH LAB;  Service: Cardiovascular;  Laterality: N/A;   Family History  Problem Relation Age of Onset  . Diabetes type II Mother   . CAD Father   . Diabetes type II Brother    Social History  Substance Use Topics  . Smoking status: Current Some Day Smoker -- 0.50 packs/day for 53 years    Types: Cigarettes  . Smokeless tobacco: Never Used  . Alcohol Use: No    Review of Systems  Unable to perform ROS: Dementia      Allergies  Review of patient's allergies indicates no known allergies.  Home Medications   Prior to Admission medications   Medication Sig Start Date End Date Taking? Authorizing Provider  acetaminophen (TYLENOL) 500 MG tablet Take 2 tablets (1,000 mg total) by mouth 2 (two) times daily. 05/14/15  Yes Gerlene Fee, NP  ALPRAZolam Duanne Moron) 0.5 MG tablet Take 0.5 mg by mouth at bedtime as needed.  03/28/15  Yes Historical Provider, MD  aspirin 81 MG tablet Take 81 mg by mouth every morning.  Yes Historical Provider, MD  atorvastatin (LIPITOR) 10 MG tablet Take 1 tablet (10 mg total) by mouth daily at 6 PM. 04/18/14  Yes Maryann Mikhail, DO  donepezil (ARICEPT) 10 MG tablet Take 10 mg by mouth at bedtime.  02/23/15  Yes Historical Provider, MD  latanoprost (XALATAN) 0.005 % ophthalmic solution Place 1 drop into both eyes at bedtime. 06/29/13  Yes Historical Provider, MD  loratadine (CLARITIN) 10 MG tablet Take 10 mg by mouth daily as needed for allergies.   Yes Historical Provider, MD  rivaroxaban (XARELTO) 20 MG TABS tablet Take 1 tablet (20 mg total) by mouth daily with supper. 06/23/14  Yes Hosie Poisson, MD  sertraline (ZOLOFT) 50 MG tablet Take 50 mg by mouth every morning.  05/18/14  Yes Historical Provider, MD  tamsulosin (FLOMAX) 0.4 MG CAPS capsule Take 1 capsule (0.4 mg total) by mouth daily. 04/18/14   Yes Maryann Mikhail, DO   BP 114/60 mmHg  Pulse 63  Temp(Src) 95.6 F (35.3 C) (Rectal)  Resp 16  SpO2 100% Physical Exam  Constitutional: He appears cachectic.  HENT:  Head: Normocephalic and atraumatic.  Right Ear: External ear normal.  Left Ear: External ear normal.  Nose: Nose normal.  Mouth/Throat: Mucous membranes are dry.  Eyes: Conjunctivae and EOM are normal. Pupils are equal, round, and reactive to light.  Neck: Normal range of motion. Neck supple.  Cardiovascular: Normal rate, regular rhythm, normal heart sounds and intact distal pulses.   Pulmonary/Chest: Effort normal and breath sounds normal.  Abdominal: Soft. Bowel sounds are normal.  Musculoskeletal: Normal range of motion.  Neurological: He is alert. He is disoriented.  KNOWS NAME.  DOESN'T KNOW DATE.  Skin: Skin is warm and dry.  Psychiatric: He has a normal mood and affect. His behavior is normal. Judgment and thought content normal.  Nursing note and vitals reviewed.   ED Course  Procedures (including critical care time) Labs Review Labs Reviewed  CBC WITH DIFFERENTIAL/PLATELET - Abnormal; Notable for the following:    RBC 3.81 (*)    Hemoglobin 7.3 (*)    HCT 26.5 (*)    MCV 69.6 (*)    MCH 19.2 (*)    MCHC 27.5 (*)    RDW 16.5 (*)    All other components within normal limits  COMPREHENSIVE METABOLIC PANEL - Abnormal; Notable for the following:    Glucose, Bld 218 (*)    Creatinine, Ser 1.31 (*)    Calcium 8.6 (*)    Total Protein 6.2 (*)    Albumin 3.0 (*)    GFR calc non Af Amer 51 (*)    GFR calc Af Amer 59 (*)    All other components within normal limits  URINALYSIS, ROUTINE W REFLEX MICROSCOPIC (NOT AT [facility]) - Abnormal; Notable for the following:    Protein, ur 100 (*)    All other components within normal limits  PROTIME-INR - Abnormal; Notable for the following:    Prothrombin Time 19.4 (*)    INR 1.64 (*)    All other components within normal limits  CK - Abnormal; Notable for the  following:    Total CK 42 (*)    All other components within normal limits  URINE MICROSCOPIC-ADD ON - Abnormal; Notable for the following:    Casts HYALINE CASTS (*)    All other components within normal limits  CBG MONITORING, ED - Abnormal; Notable for the following:    Glucose-Capillary 223 (*)    All other components within  normal limits  I-STAT CG4 LACTIC ACID, ED - Abnormal; Notable for the following:    Lactic Acid, Venous 3.93 (*)    All other components within normal limits  CULTURE, BLOOD (ROUTINE X 2)  CULTURE, BLOOD (ROUTINE X 2)  AMMONIA  TROPONIN I  LACTIC ACID, PLASMA  MYOGLOBIN, SERUM  LACTIC ACID, PLASMA  I-STAT CG4 LACTIC ACID, ED    Imaging Review Dg Chest 2 View  09/08/2015  CLINICAL DATA:  Shortness of breath and weakness. EXAM: CHEST  2 VIEW COMPARISON:  12/21/2014 FINDINGS: Left-sided pacemaker unchanged. Lungs are hyperexpanded without focal consolidation or effusion. Cardiomediastinal silhouette is within normal. Remainder of the exam is unchanged. IMPRESSION: No acute cardiopulmonary disease. COPD. Electronically Signed   By: Marin Olp M.D.   On: 09/08/2015 11:20   Ct Head Wo Contrast  09/08/2015  CLINICAL DATA:  Unwitnessed fall at nursing home. Initial encounter. EXAM: CT HEAD WITHOUT CONTRAST CT CERVICAL SPINE WITHOUT CONTRAST TECHNIQUE: Multidetector CT imaging of the head and cervical spine was performed following the standard protocol without intravenous contrast. Multiplanar CT image reconstructions of the cervical spine were also generated. COMPARISON:  Head CT 12/21/2014 FINDINGS: CT HEAD FINDINGS Skull and Sinuses:Negative for fracture or hemo sinus. Benign ground-glass density in the left occipital bone that is small and stable. Visualized orbits: Gaze to the right, nonspecific. No evidence of orbital injury. Brain: No evidence of acute infarction, hemorrhage, hydrocephalus, or mass lesion/mass effect. There is advanced chronic ischemic injury with  numerous bilateral cerebellar infarcts, largest area in the inferior left cerebellum. Multiple bilateral subcortical infarcts, most discrete in the right thalamus and caudate head. Extensive fairly symmetric cortical infarct and atrophy, aligned in a watershed distribution. CT CERVICAL SPINE FINDINGS Negative for acute fracture or subluxation. No prevertebral edema. No gross cervical canal hematoma. Diffuse disc degeneration and spondylotic spurring. No indication of high-grade canal stenosis. IMPRESSION: 1. No evidence of acute intracranial or cervical spine injury. 2. Extensive chronic ischemic injury as described, stable from 12/21/2014 comparison. Electronically Signed   By: Monte Fantasia M.D.   On: 09/08/2015 11:04   Ct Cervical Spine Wo Contrast  09/08/2015  CLINICAL DATA:  Unwitnessed fall at nursing home. Initial encounter. EXAM: CT HEAD WITHOUT CONTRAST CT CERVICAL SPINE WITHOUT CONTRAST TECHNIQUE: Multidetector CT imaging of the head and cervical spine was performed following the standard protocol without intravenous contrast. Multiplanar CT image reconstructions of the cervical spine were also generated. COMPARISON:  Head CT 12/21/2014 FINDINGS: CT HEAD FINDINGS Skull and Sinuses:Negative for fracture or hemo sinus. Benign ground-glass density in the left occipital bone that is small and stable. Visualized orbits: Gaze to the right, nonspecific. No evidence of orbital injury. Brain: No evidence of acute infarction, hemorrhage, hydrocephalus, or mass lesion/mass effect. There is advanced chronic ischemic injury with numerous bilateral cerebellar infarcts, largest area in the inferior left cerebellum. Multiple bilateral subcortical infarcts, most discrete in the right thalamus and caudate head. Extensive fairly symmetric cortical infarct and atrophy, aligned in a watershed distribution. CT CERVICAL SPINE FINDINGS Negative for acute fracture or subluxation. No prevertebral edema. No gross cervical canal  hematoma. Diffuse disc degeneration and spondylotic spurring. No indication of high-grade canal stenosis. IMPRESSION: 1. No evidence of acute intracranial or cervical spine injury. 2. Extensive chronic ischemic injury as described, stable from 12/21/2014 comparison. Electronically Signed   By: Monte Fantasia M.D.   On: 09/08/2015 11:04   I have personally reviewed and evaluated these images and lab results as part of my  medical decision-making.   EKG Interpretation None    EKG NOT GOING THROUGH ON MUSE.  HR 60.  NO STEMI.  MDM  PT IS NOW LOOKING BETTER.  HIS INITIAL ISTAT LACTIC ACID WAS HIGH, BUT NO SIGNS OF INFXN.  AFTER IVFS.  LACTIC ACID IS NL.  HOWEVER, PT'S INITIAL HGB IS 7.3 WITH THE LAST HGB IN December OF 9.7.  I REPEATED THE H/H TO SEE IF THAT WAS A POSSIBLE LAB ERROR AND IT WAS SIMILAR.  THE PT'S STOOL WAS NEGATIVE FOR BLOOD BOTH WITH A RECTAL EXAM AND WITH A LARGE BM.  I AM NOT SURE WHY PT IS SO ANEMIC, BUT HE IS ON XARELTO, SO I THINK HE NEEDS TO STAY.  PT D/W DR. Allyson Sabal ([facility]) WHO WILL ADMIT PT FOR OBS.  Final diagnoses:  Fall, initial encounter  Dehydration   SYMPTOMATIC ANEMIA ACCIDENTAL FALL    Isla Pence, MD 09/11/15 [phone]

## 2015-09-08 NOTE — ED Notes (Addendum)
Bladder Scanner: 250cc or urine

## 2015-09-08 NOTE — ED Notes (Signed)
Attempted report 

## 2015-09-09 ENCOUNTER — Inpatient Hospital Stay (HOSPITAL_COMMUNITY): Payer: Medicare Other

## 2015-09-09 DIAGNOSIS — F419 Anxiety disorder, unspecified: Secondary | ICD-10-CM | POA: Diagnosis not present

## 2015-09-09 DIAGNOSIS — D638 Anemia in other chronic diseases classified elsewhere: Secondary | ICD-10-CM | POA: Diagnosis not present

## 2015-09-09 DIAGNOSIS — R413 Other amnesia: Secondary | ICD-10-CM

## 2015-09-09 DIAGNOSIS — R531 Weakness: Secondary | ICD-10-CM | POA: Diagnosis not present

## 2015-09-09 DIAGNOSIS — D508 Other iron deficiency anemias: Secondary | ICD-10-CM | POA: Diagnosis not present

## 2015-09-09 DIAGNOSIS — E1100 Type 2 diabetes mellitus with hyperosmolarity without nonketotic hyperglycemic-hyperosmolar coma (NKHHC): Secondary | ICD-10-CM | POA: Diagnosis not present

## 2015-09-09 LAB — MRSA PCR SCREENING: MRSA by PCR: NEGATIVE

## 2015-09-09 LAB — CBC
HCT: 31.2 % — ABNORMAL LOW (ref 39.0–52.0)
HEMATOCRIT: 31.3 % — AB (ref 39.0–52.0)
Hemoglobin: 9 g/dL — ABNORMAL LOW (ref 13.0–17.0)
Hemoglobin: 9 g/dL — ABNORMAL LOW (ref 13.0–17.0)
MCH: 20.7 pg — AB (ref 26.0–34.0)
MCH: 20.7 pg — AB (ref 26.0–34.0)
MCHC: 28.8 g/dL — AB (ref 30.0–36.0)
MCHC: 28.8 g/dL — ABNORMAL LOW (ref 30.0–36.0)
MCV: 71.9 fL — AB (ref 78.0–100.0)
MCV: 72.1 fL — AB (ref 78.0–100.0)
PLATELETS: 174 10*3/uL (ref 150–400)
PLATELETS: 174 10*3/uL (ref 150–400)
RBC: 4.34 MIL/uL (ref 4.22–5.81)
RBC: 4.34 MIL/uL (ref 4.22–5.81)
RDW: 17.5 % — AB (ref 11.5–15.5)
RDW: 17.4 % — AB (ref 11.5–15.5)
WBC: 7.9 10*3/uL (ref 4.0–10.5)
WBC: 7.5 10*3/uL (ref 4.0–10.5)

## 2015-09-09 LAB — C DIFFICILE QUICK SCREEN W PCR REFLEX
C DIFFICILE (CDIFF) INTERP: NEGATIVE
C Diff antigen: NEGATIVE
C Diff toxin: NEGATIVE

## 2015-09-09 LAB — COMPREHENSIVE METABOLIC PANEL
ALBUMIN: 2.9 g/dL — AB (ref 3.5–5.0)
ALT: 28 U/L (ref 17–63)
ANION GAP: 12 (ref 5–15)
AST: 20 U/L (ref 15–41)
Alkaline Phosphatase: 84 U/L (ref 38–126)
BUN: 15 mg/dL (ref 6–20)
CHLORIDE: 103 mmol/L (ref 101–111)
CO2: 22 mmol/L (ref 22–32)
CREATININE: 1.33 mg/dL — AB (ref 0.61–1.24)
Calcium: 8.3 mg/dL — ABNORMAL LOW (ref 8.9–10.3)
GFR calc Af Amer: 58 mL/min — ABNORMAL LOW (ref >60)
GFR calc non Af Amer: 50 mL/min — ABNORMAL LOW (ref >60)
Glucose, Bld: 149 mg/dL — ABNORMAL HIGH (ref 65–99)
POTASSIUM: 4 mmol/L (ref 3.5–5.1)
SODIUM: 137 mmol/L (ref 135–145)
Total Bilirubin: 1.8 mg/dL — ABNORMAL HIGH (ref 0.3–1.2)
Total Protein: 6.1 g/dL — ABNORMAL LOW (ref 6.5–8.1)

## 2015-09-09 LAB — TROPONIN I: Troponin I: <0.03 ng/mL (ref <0.031)

## 2015-09-09 LAB — IRON AND TIBC
Iron: 68 ug/dL (ref 45–182)
Saturation Ratios: 15 % — ABNORMAL LOW (ref 17.9–39.5)
TIBC: 449 ug/dL (ref 250–450)
UIBC: 381 ug/dL

## 2015-09-09 LAB — FERRITIN: FERRITIN: 4 ng/mL — AB (ref 24–336)

## 2015-09-09 LAB — VITAMIN B12: Vitamin B-12: 355 pg/mL (ref 180–914)

## 2015-09-09 LAB — HEMOGLOBIN A1C
HEMOGLOBIN A1C: 6.8 % — AB (ref 4.8–5.6)
MEAN PLASMA GLUCOSE: 148 mg/dL

## 2015-09-09 LAB — FOLATE: Folate: 10.7 ng/mL (ref >5.9)

## 2015-09-09 LAB — RETICULOCYTES
RBC.: 4.34 MIL/uL (ref 4.22–5.81)
RETIC COUNT ABSOLUTE: 34.7 10*3/uL (ref 19.0–186.0)
RETIC CT PCT: 0.8 % (ref 0.4–3.1)

## 2015-09-09 LAB — MYOGLOBIN, SERUM: Myoglobin: 78 ng/mL — ABNORMAL HIGH (ref 28–72)

## 2015-09-09 LAB — GLUCOSE, CAPILLARY
GLUCOSE-CAPILLARY: 82 mg/dL (ref 65–99)
GLUCOSE-CAPILLARY: 116 mg/dL — AB (ref 65–99)
Glucose-Capillary: 122 mg/dL — ABNORMAL HIGH (ref 65–99)
Glucose-Capillary: 80 mg/dL (ref 65–99)

## 2015-09-09 NOTE — Progress Notes (Signed)
Bedside EEG completed, results pending. 

## 2015-09-09 NOTE — Progress Notes (Signed)
[facility]                                                                              Patient Demographics  Grant Lara, is a 75 y.o. male, DOB - 06/14/1940, ZSW:[id number]  Admit date - 09/08/2015   Admitting Physician Reyne Dumas, MD  Outpatient Primary MD for the patient is [name], MD  Outpatient specialists:   LOS - 1  days    Chief Complaint  Patient presents with  . Fall       Brief summary   75 y.o. male with PMH of dementia, hypertension, hyperlipidemia, diabetes mellitus, depression, CAD, DVT, PE on Xarelto, PFO, chronic disease, stroke, BPH, systolic congestive heart failure (EF 20-25%), who presented with generalized weakness and increased urinary frequency brought in by EMS from [facility] for unwitnessed fall. Patient was on the floor for an unknown period of time, noted to have emesis x 3 after the episode. Because of dementia unable to obtain clear history, . Pupils found to be sluggish, but reactive. No bradycardia seen on EKG. CBG 286. VS 99/64, hr 70 (paced), rr 16, o2 96% RA. Initial lactic acid 3.93 that improved after IV fluids, initial hemoglobin 7.3 subsequently 7.0. Stool guaiac-negative 2 in the ER. Patient is on Xarelto for history of pulmonary embolism. Unable to state if the patient has had any melena or hematochezia.   Assessment & Plan    Anemia Of unclear etiology on xarelto prior to admission:  - Hemoglobin down to 7.0, baseline 8-9 - Currently receiving packed RBC transfusion, will check CBC - obtain anemia panel, FOBT - followed by Dr.Buccini, colonoscopy in 2012 that did not show any colonic mass, has a history of Crohn's disease but the colonoscopy did not show any evidence of Crohn's - If FOBT positive and hemoglobin trending down, we will consult gastroenterology   Advanced dementia - CT head showed no evidence of acute intracranial or cervical spine injury, extensive chronic ischemic changes  -  Continue Aricept    Diabetes mellitus type 2 -Hemoglobin A1c 6.8, continue sliding scale insulin   lactic acidosis -  initially likely secondary to dehydration, resolved  Anxiety, failure to thrive -continue prn Xanax at bedtime  Dilated cardiomyopathy with an EF of 20-25% based on 2-D echo in February 2016 History of coronary artery disease with prior PCI to LAD in 2015 - Cardiac enzymes negative so far  History of pulmonary embolism chronically on Xarelto, after failing warfarin due to difficulty maintaining a therapeutic INR - Patient also had history of embolic CVA in 3/22, 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 Erlinda Hong)   [employer] AICD placed for primary prevention of SCD by Dr. Klein-interrogate because of recent falls   moderate protein calorie malnutrition   Code Status: Full CODE STATUS  DVT Prophylaxis:  SCD's   Family Communication: no family member at the bedside    Disposition Plan:   Time Spent in minutes  25 minutes  Procedures:  CT head, CT C-spine  Consultants None  Antimicrobials :  None    Medications  Scheduled Meds: . sodium chloride   Intravenous Once  . atorvastatin  10 mg Oral q1800  . donepezil  10 mg Oral QHS  . insulin aspart  0-9 Units Subcutaneous TID WC  . latanoprost  1 drop Both Eyes QHS  . sertraline  50 mg Oral BH-q7a  . sodium chloride flush  3 mL Intravenous Q12H  . tamsulosin  0.4 mg Oral Daily   Continuous Infusions: . sodium chloride 75 mL/hr at 09/08/15 1823   PRN Meds:.acetaminophen **OR** acetaminophen, ALPRAZolam, levalbuterol, ondansetron **OR** ondansetron (ZOFRAN) IV   Antibiotics   Anti-infectives    None        Subjective:   Grant Lara was seen and examined today. Confused , on packed RBC transfusion, denies any specific complaints. Patient denies dizziness, chest pain,  shortness of breath, abdominal pain, N/V/D/C, new weakness, numbess, tingling. No acute events overnight.    Objective:   Filed Vitals:   09/09/15 0247 09/09/15 0510 09/09/15 0529 09/09/15 0909  BP: 102/57 109/60 105/57 104/61  Pulse: 69 68 63 65  Temp: 97.5 F (36.4 C) 97.7 F (36.5 C) 97.5 F (36.4 C) 97.2 F (36.2 C)  TempSrc: Oral Oral Oral Oral  Resp: 16 16 16 18   SpO2: 100% 100% 100% 100%    Intake/Output Summary (Last 24 hours) at 09/09/15 1017 Last data filed at 09/09/15 1000  Gross per 24 hour  Intake 1961.25 ml  Output   1145 ml  Net 816.25 ml     Wt Readings from Last 3 Encounters:  09/07/15 54.885 kg (121 lb)  08/07/15 55.52 kg (122 lb 6.4 oz)  06/19/15 53.978 kg (119 lb)     Exam  General: Alert and oriented x Self, confusedNAD  HEENT:  PERRLA, EOMI, Anicteric Sclera, mucous membranes moist.   Neck: Supple, no JVD, no masses  Cardiovascular: S1 S2 auscultated, no rubs, murmurs or gallops. Regular rate and rhythm.  Respiratory: Clear to auscultation bilaterally, no wheezing, rales or rhonchi  Gastrointestinal: Soft, nontender, nondistended, + bowel sounds  Ext: no cyanosis clubbing or edema  Neuro: moving all 4 extremities  Skin: No rashes  Psych: Confused    Data Reviewed:  I have personally reviewed following labs and imaging studies  Micro Results Recent Results (from the past 240 hour(s))  Blood culture (routine x 2)     Status: None (Preliminary result)   Collection Time: 09/08/15  9:57 AM  Result Value Ref Range Status   Specimen Description BLOOD RIGHT ANTECUBITAL  Final   Special Requests IN PEDIATRIC BOTTLE  3CC  Final   Culture NO GROWTH < 24 HOURS  Final   Report Status PENDING  Incomplete  Blood culture (routine x 2)     Status: None (Preliminary result)   Collection Time: 09/08/15 10:00 AM  Result Value Ref Range Status   Specimen Description BLOOD RIGHT HAND  Final   Special Requests BOTTLES DRAWN AEROBIC AND ANAEROBIC   4CC  Final   Culture NO GROWTH < 24 HOURS  Final   Report Status PENDING  Incomplete  C difficile quick scan w PCR reflex     Status: None   Collection Time: 09/09/15  2:18 AM  Result Value Ref Range Status   C Diff antigen NEGATIVE NEGATIVE Final   C Diff toxin NEGATIVE NEGATIVE Final   C Diff interpretation Negative for toxigenic C. difficile  Final    Radiology Reports Dg Chest 2 View  09/08/2015  CLINICAL  DATA:  Shortness of breath and weakness. EXAM: CHEST  2 VIEW COMPARISON:  12/21/2014 FINDINGS: Left-sided pacemaker unchanged. Lungs are hyperexpanded without focal consolidation or effusion. Cardiomediastinal silhouette is within normal. Remainder of the exam is unchanged. IMPRESSION: No acute cardiopulmonary disease. COPD. Electronically Signed   By: Marin Olp M.D.   On: 09/08/2015 11:20   Abd 1 View (kub)  09/08/2015  CLINICAL DATA:  75 year old male with diabetes, high blood pressure and hyperlipidemia presenting with generalized weakness and increased urinary frequency. On witnessed fall. Initial encounter. EXAM: ABDOMEN - 1 VIEW COMPARISON:  05/02/2014 and 07/16/2013. FINDINGS: No evidence of bowel obstruction. The possibility of free intraperitoneal air cannot be assessed on a supine view. No acute fracture noted. Vascular calcifications. IMPRESSION: No evidence of bowel obstruction. No acute fracture noted. Electronically Signed   By: Genia Del M.D.   On: 09/08/2015 23:37   Ct Head Wo Contrast  09/08/2015  CLINICAL DATA:  Unwitnessed fall at nursing home. Initial encounter. EXAM: CT HEAD WITHOUT CONTRAST CT CERVICAL SPINE WITHOUT CONTRAST TECHNIQUE: Multidetector CT imaging of the head and cervical spine was performed following the standard protocol without intravenous contrast. Multiplanar CT image reconstructions of the cervical spine were also generated. COMPARISON:  Head CT 12/21/2014 FINDINGS: CT HEAD FINDINGS Skull and Sinuses:Negative for fracture or hemo sinus. Benign  ground-glass density in the left occipital bone that is small and stable. Visualized orbits: Gaze to the right, nonspecific. No evidence of orbital injury. Brain: No evidence of acute infarction, hemorrhage, hydrocephalus, or mass lesion/mass effect. There is advanced chronic ischemic injury with numerous bilateral cerebellar infarcts, largest area in the inferior left cerebellum. Multiple bilateral subcortical infarcts, most discrete in the right thalamus and caudate head. Extensive fairly symmetric cortical infarct and atrophy, aligned in a watershed distribution. CT CERVICAL SPINE FINDINGS Negative for acute fracture or subluxation. No prevertebral edema. No gross cervical canal hematoma. Diffuse disc degeneration and spondylotic spurring. No indication of high-grade canal stenosis. IMPRESSION: 1. No evidence of acute intracranial or cervical spine injury. 2. Extensive chronic ischemic injury as described, stable from 12/21/2014 comparison. Electronically Signed   By: Monte Fantasia M.D.   On: 09/08/2015 11:04   Ct Cervical Spine Wo Contrast  09/08/2015  CLINICAL DATA:  Unwitnessed fall at nursing home. Initial encounter. EXAM: CT HEAD WITHOUT CONTRAST CT CERVICAL SPINE WITHOUT CONTRAST TECHNIQUE: Multidetector CT imaging of the head and cervical spine was performed following the standard protocol without intravenous contrast. Multiplanar CT image reconstructions of the cervical spine were also generated. COMPARISON:  Head CT 12/21/2014 FINDINGS: CT HEAD FINDINGS Skull and Sinuses:Negative for fracture or hemo sinus. Benign ground-glass density in the left occipital bone that is small and stable. Visualized orbits: Gaze to the right, nonspecific. No evidence of orbital injury. Brain: No evidence of acute infarction, hemorrhage, hydrocephalus, or mass lesion/mass effect. There is advanced chronic ischemic injury with numerous bilateral cerebellar infarcts, largest area in the inferior left cerebellum. Multiple  bilateral subcortical infarcts, most discrete in the right thalamus and caudate head. Extensive fairly symmetric cortical infarct and atrophy, aligned in a watershed distribution. CT CERVICAL SPINE FINDINGS Negative for acute fracture or subluxation. No prevertebral edema. No gross cervical canal hematoma. Diffuse disc degeneration and spondylotic spurring. No indication of high-grade canal stenosis. IMPRESSION: 1. No evidence of acute intracranial or cervical spine injury. 2. Extensive chronic ischemic injury as described, stable from 12/21/2014 comparison. Electronically Signed   By: Monte Fantasia M.D.   On: 09/08/2015 11:04  Lab Data:  CBC:  Recent Labs Lab 09/08/15 1006 09/08/15 1607  WBC 7.2  --   NEUTROABS 5.5  --   HGB 7.3* 7.0*  HCT 26.5* 25.9*  MCV 69.6*  --   PLT PLATELET CLUMPS NOTED ON SMEAR, UNABLE TO ESTIMATE  --    Basic Metabolic Panel:  Recent Labs Lab 09/08/15 1006 09/08/15 1902  NA 137  --   K 4.0  --   CL 107  --   CO2 22  --   GLUCOSE 218*  --   BUN 15  --   CREATININE 1.31*  --   CALCIUM 8.6*  --   MG  --  1.7   GFR: Estimated Creatinine Clearance: 37.3 mL/min (by C-G formula based on Cr of 1.31). Liver Function Tests:  Recent Labs Lab 09/08/15 1006  AST 27  ALT 35  ALKPHOS 86  BILITOT 0.8  PROT 6.2*  ALBUMIN 3.0*   No results for input(s): LIPASE, AMYLASE in the last 168 hours.  Recent Labs Lab 09/08/15 1006  AMMONIA 26   Coagulation Profile:  Recent Labs Lab 09/08/15 1045  INR 1.64*   Cardiac Enzymes:  Recent Labs Lab 09/08/15 1006 09/08/15 1045 09/08/15 1902 09/08/15 2312  CKTOTAL  --  42*  --   --   TROPONINI <0.03  --  <0.03 <0.03   BNP (last 3 results) No results for input(s): PROBNP in the last 8760 hours. HbA1C:  Recent Labs  09/08/15 1902  HGBA1C 6.8*   CBG:  Recent Labs Lab 09/08/15 1008 09/08/15 1808 09/08/15 2130 09/09/15 0758  GLUCAP 223* 103* 160* 82   Lipid Profile: No results for  input(s): CHOL, HDL, LDLCALC, TRIG, CHOLHDL, LDLDIRECT in the last 72 hours. Thyroid Function Tests:  Recent Labs  09/08/15 1902  TSH 1.135   Anemia Panel: No results for input(s): VITAMINB12, FOLATE, FERRITIN, TIBC, IRON, RETICCTPCT in the last 72 hours. Urine analysis:    Component Value Date/Time   COLORURINE YELLOW 09/08/2015 1145   APPEARANCEUR CLEAR 09/08/2015 1145   LABSPEC 1.025 09/08/2015 1145   PHURINE 6.0 09/08/2015 1145   GLUCOSEU NEGATIVE 09/08/2015 1145   HGBUR NEGATIVE 09/08/2015 1145   BILIRUBINUR NEGATIVE 09/08/2015 1145   KETONESUR NEGATIVE 09/08/2015 1145   PROTEINUR 100* 09/08/2015 1145   UROBILINOGEN 1.0 12/21/2014 1520   NITRITE NEGATIVE 09/08/2015 [place] 09/08/2015 [place] M.D. [facility] 09/09/2015, 10:17 AM  Pager: [phone] Between 7am to 7pm - call Pager - [phone]  After 7pm go to [email or web address] - password [email or web address]  Call night coverage person covering after 7pm

## 2015-09-09 NOTE — Procedures (Signed)
History: 75 year old male with a history of falls  Sedation: None  Technique: This is a 21 channel routine scalp EEG performed at the bedside with bipolar and monopolar montages arranged in accordance to the international 10/20 system of electrode placement. One channel was dedicated to EKG recording.    Background: The onset of the recording is consistent with sleep. With awakening, a very low voltage posterior dominant rhythm of 6-8 micro-volts with a frequency of 8 Hz is seen. Other than low voltage, the waking background is relatively normal.  Photic stimulation: Physiologic driving is not performed  EEG Abnormalities: 1) low voltage EEG, of uncertain clinical significance but not associated with seizure predisposition.  Clinical Interpretation: This essentially normal EEG is recorded in the waking and sleep state. There was no seizure or seizure predisposition recorded on this study. Please note that a normal EEG does not preclude the possibility of epilepsy.   Roland Rack, MD [facility] [phone]  If 7pm- 7am, please page neurology on call as listed in [place].

## 2015-09-10 DIAGNOSIS — D508 Other iron deficiency anemias: Secondary | ICD-10-CM | POA: Diagnosis not present

## 2015-09-10 DIAGNOSIS — D638 Anemia in other chronic diseases classified elsewhere: Secondary | ICD-10-CM | POA: Diagnosis not present

## 2015-09-10 DIAGNOSIS — F419 Anxiety disorder, unspecified: Secondary | ICD-10-CM | POA: Diagnosis not present

## 2015-09-10 DIAGNOSIS — R531 Weakness: Secondary | ICD-10-CM | POA: Diagnosis not present

## 2015-09-10 DIAGNOSIS — E1100 Type 2 diabetes mellitus with hyperosmolarity without nonketotic hyperglycemic-hyperosmolar coma (NKHHC): Secondary | ICD-10-CM | POA: Diagnosis not present

## 2015-09-10 LAB — GLUCOSE, CAPILLARY
GLUCOSE-CAPILLARY: 81 mg/dL (ref 65–99)
GLUCOSE-CAPILLARY: 127 mg/dL — AB (ref 65–99)
GLUCOSE-CAPILLARY: 133 mg/dL — AB (ref 65–99)
Glucose-Capillary: 149 mg/dL — ABNORMAL HIGH (ref 65–99)

## 2015-09-10 LAB — BASIC METABOLIC PANEL
ANION GAP: 8 (ref 5–15)
BUN: 14 mg/dL (ref 6–20)
CALCIUM: 8.9 mg/dL (ref 8.9–10.3)
CO2: 22 mmol/L (ref 22–32)
CREATININE: 1.16 mg/dL (ref 0.61–1.24)
Chloride: 109 mmol/L (ref 101–111)
GFR calc non Af Amer: 59 mL/min — ABNORMAL LOW (ref >60)
Glucose, Bld: 75 mg/dL (ref 65–99)
Potassium: 4.1 mmol/L (ref 3.5–5.1)
SODIUM: 139 mmol/L (ref 135–145)

## 2015-09-10 LAB — TYPE AND SCREEN
ABO/RH(D): B POS
ANTIBODY SCREEN: NEGATIVE
UNIT DIVISION: 0
UNIT DIVISION: 0

## 2015-09-10 LAB — CBC
HEMATOCRIT: 31.2 % — AB (ref 39.0–52.0)
HEMOGLOBIN: 9 g/dL — AB (ref 13.0–17.0)
MCH: 20.6 pg — ABNORMAL LOW (ref 26.0–34.0)
MCHC: 28.8 g/dL — ABNORMAL LOW (ref 30.0–36.0)
MCV: 71.6 fL — ABNORMAL LOW (ref 78.0–100.0)
Platelets: 179 10*3/uL (ref 150–400)
RBC: 4.36 MIL/uL (ref 4.22–5.81)
RDW: 17.5 % — AB (ref 11.5–15.5)
WBC: 9.8 10*3/uL (ref 4.0–10.5)

## 2015-09-10 MED ORDER — POLYETHYLENE GLYCOL 3350 17 G PO PACK
17.0000 g | PACK | Freq: Once | ORAL | Status: AC
  Administered 2015-09-10: 17 g via ORAL
  Filled 2015-09-10: qty 1

## 2015-09-10 MED ORDER — SENNOSIDES-DOCUSATE SODIUM 8.6-50 MG PO TABS
1.0000 | ORAL_TABLET | Freq: Two times a day (BID) | ORAL | Status: DC
  Administered 2015-09-10 – 2015-09-11 (×3): 1 via ORAL
  Filled 2015-09-10 (×3): qty 1

## 2015-09-10 NOTE — Progress Notes (Signed)
[facility]                                                                              Patient Demographics  Grant Lara, is a 75 y.o. male, DOB - 10-20-1940, XAJ:[id number]  Admit date - 09/08/2015   Admitting Physician Reyne Dumas, MD  Outpatient Primary MD for the patient is [name], MD  Outpatient specialists:   LOS - 2  days    Chief Complaint  Patient presents with  . Fall       Brief summary   75 y.o. male with PMH of dementia, hypertension, hyperlipidemia, diabetes mellitus, depression, CAD, DVT, PE on Xarelto, PFO, chronic disease, stroke, BPH, systolic congestive heart failure (EF 20-25%), who presented with generalized weakness and increased urinary frequency brought in by EMS from [facility] for unwitnessed fall. Patient was on the floor for an unknown period of time, noted to have emesis x 3 after the episode. Because of dementia unable to obtain clear history, . Pupils found to be sluggish, but reactive. No bradycardia seen on EKG. CBG 286. VS 99/64, hr 70 (paced), rr 16, o2 96% RA. Initial lactic acid 3.93 that improved after IV fluids, initial hemoglobin 7.3 subsequently 7.0. Stool guaiac-negative 2 in the ER. Patient is on Xarelto for history of pulmonary embolism. Unable to state if the patient has had any melena or hematochezia.   Assessment & Plan    Anemia Of unclear etiology on xarelto prior to admission:  - Hemoglobin down to 7.0, baseline 8-9 - H&H currently stable - followed by Dr.Buccini, colonoscopy in 2012 that did not show any colonic mass, has a history of Crohn's disease but the colonoscopy did not show any evidence of Crohn's - FOBT still pending, placed on stool softeners, no BM since yesterday. 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 Erlinda Hong)   [employer] AICD placed for primary prevention of SCD by Dr. Klein-interrogate because of recent falls   moderate protein calorie malnutrition   Code Status: Full CODE STATUS  DVT Prophylaxis:  SCD's   Family Communication: no family member at the bedside    Disposition Plan: If FOBT negative, may DC home in a.m.  Time Spent in minutes  25 minutes  Procedures:  CT  head, CT C-spine  Consultants None  Antimicrobials :   None    Medications  Scheduled Meds: . sodium chloride   Intravenous Once  . atorvastatin  10 mg Oral q1800  . donepezil  10 mg Oral QHS  . insulin aspart  0-9 Units Subcutaneous TID WC  . latanoprost  1 drop Both Eyes QHS  . senna-docusate  1 tablet Oral BID  . sertraline  50 mg Oral BH-q7a  . sodium chloride flush  3 mL Intravenous Q12H  . tamsulosin  0.4 mg Oral Daily   Continuous Infusions: . sodium chloride 75 mL/hr at 09/10/15 0602   PRN Meds:.acetaminophen **OR** acetaminophen, ALPRAZolam, levalbuterol, ondansetron **OR** ondansetron (ZOFRAN) IV   Antibiotics   Anti-infectives    None        Subjective:   Grant Lara was seen and examined today.  Denies any specific complaints. No  abdominal pain, nausea or vomiting or any obvious GI bleeding.  Patient denies dizziness, chest pain, shortness of breath, new weakness, numbess, tingling. No acute events overnight.  No BM since yesterday  Objective:   Filed Vitals:   09/09/15 0909 09/09/15 1356 09/09/15 2101 09/10/15 0519  BP: 104/61 110/58 113/59 125/64  Pulse: 65 60 62 62  Temp: 97.2 F (36.2 C)  97.8 F (36.6 C) 98.1 F (36.7 C)  TempSrc: Oral     Resp: 18 18 16 18   SpO2: 100% 99% 100% 98%    Intake/Output Summary (Last 24 hours) at 09/10/15 1044 Last data filed at 09/10/15 1000  Gross per 24 hour  Intake   1605 ml  Output    700 ml  Net    905 ml     Wt Readings from Last 3 Encounters:  09/07/15 54.885 kg (121 lb)  08/07/15 55.52 kg (122 lb 6.4 oz)  06/19/15 53.978 kg (119 lb)     Exam  General: Alert and oriented x Self, confusedNAD  HEENT:   Neck:   Cardiovascular: S1 S2 clear, RRR.  Respiratory: Clear to auscultation bilaterally, no wheezing, rales or rhonchi  Gastrointestinal: Soft, nontender, nondistended, + bowel sounds  Ext: no cyanosis clubbing or edema  Neuro: moving all 4 extremities  Skin: No rashes  Psych: Confused    Data Reviewed:  I have personally reviewed following labs and imaging studies  Micro Results Recent Results (from the past 240 hour(s))  Blood culture (routine x 2)     Status: None (Preliminary result)   Collection Time: 09/08/15  9:57 AM  Result Value Ref Range Status   Specimen Description BLOOD RIGHT ANTECUBITAL  Final   Special Requests IN PEDIATRIC BOTTLE  3CC  Final   Culture NO GROWTH < 24 HOURS  Final   Report Status PENDING  Incomplete  Blood culture (routine x 2)     Status: None (Preliminary result)   Collection Time: 09/08/15 10:00 AM  Result Value Ref Range Status   Specimen Description BLOOD RIGHT HAND  Final   Special Requests BOTTLES DRAWN AEROBIC AND ANAEROBIC  4CC  Final   Culture NO GROWTH < 24 HOURS  Final   Report Status  PENDING  Incomplete  C difficile quick scan w PCR reflex     Status: None   Collection Time: 09/09/15  2:18 AM  Result Value Ref Range Status   C Diff antigen NEGATIVE NEGATIVE Final   C Diff toxin NEGATIVE NEGATIVE Final   C Diff interpretation Negative for toxigenic C. difficile  Final  MRSA PCR Screening  Status: None   Collection Time: 09/09/15 12:19 PM  Result Value Ref Range Status   MRSA by PCR NEGATIVE NEGATIVE Final    Comment:        The GeneXpert MRSA Assay (FDA approved for NASAL specimens only), is one component of a comprehensive MRSA colonization surveillance program. It is not intended to diagnose MRSA infection nor to guide or monitor treatment for MRSA infections.     Radiology Reports Dg Chest 2 View  09/08/2015  CLINICAL DATA:  Shortness of breath and weakness. EXAM: CHEST  2 VIEW COMPARISON:  12/21/2014 FINDINGS: Left-sided pacemaker unchanged. Lungs are hyperexpanded without focal consolidation or effusion. Cardiomediastinal silhouette is within normal. Remainder of the exam is unchanged. IMPRESSION: No acute cardiopulmonary disease. COPD. Electronically Signed   By: Marin Olp M.D.   On: 09/08/2015 11:20   Abd 1 View (kub)  09/08/2015  CLINICAL DATA:  75 year old male with diabetes, high blood pressure and hyperlipidemia presenting with generalized weakness and increased urinary frequency. On witnessed fall. Initial encounter. EXAM: ABDOMEN - 1 VIEW COMPARISON:  05/02/2014 and 07/16/2013. FINDINGS: No evidence of bowel obstruction. The possibility of free intraperitoneal air cannot be assessed on a supine view. No acute fracture noted. Vascular calcifications. IMPRESSION: No evidence of bowel obstruction. No acute fracture noted. Electronically Signed   By: Genia Del M.D.   On: 09/08/2015 23:37   Ct Head Wo Contrast  09/08/2015  CLINICAL DATA:  Unwitnessed fall at nursing home. Initial encounter. EXAM: CT HEAD WITHOUT CONTRAST CT CERVICAL SPINE WITHOUT  CONTRAST TECHNIQUE: Multidetector CT imaging of the head and cervical spine was performed following the standard protocol without intravenous contrast. Multiplanar CT image reconstructions of the cervical spine were also generated. COMPARISON:  Head CT 12/21/2014 FINDINGS: CT HEAD FINDINGS Skull and Sinuses:Negative for fracture or hemo sinus. Benign ground-glass density in the left occipital bone that is small and stable. Visualized orbits: Gaze to the right, nonspecific. No evidence of orbital injury. Brain: No evidence of acute infarction, hemorrhage, hydrocephalus, or mass lesion/mass effect. There is advanced chronic ischemic injury with numerous bilateral cerebellar infarcts, largest area in the inferior left cerebellum. Multiple bilateral subcortical infarcts, most discrete in the right thalamus and caudate head. Extensive fairly symmetric cortical infarct and atrophy, aligned in a watershed distribution. CT CERVICAL SPINE FINDINGS Negative for acute fracture or subluxation. No prevertebral edema. No gross cervical canal hematoma. Diffuse disc degeneration and spondylotic spurring. No indication of high-grade canal stenosis. IMPRESSION: 1. No evidence of acute intracranial or cervical spine injury. 2. Extensive chronic ischemic injury as described, stable from 12/21/2014 comparison. Electronically Signed   By: Monte Fantasia M.D.   On: 09/08/2015 11:04   Ct Cervical Spine Wo Contrast  09/08/2015  CLINICAL DATA:  Unwitnessed fall at nursing home. Initial encounter. EXAM: CT HEAD WITHOUT CONTRAST CT CERVICAL SPINE WITHOUT CONTRAST TECHNIQUE: Multidetector CT imaging of the head and cervical spine was performed following the standard protocol without intravenous contrast. Multiplanar CT image reconstructions of the cervical spine were also generated. COMPARISON:  Head CT 12/21/2014 FINDINGS: CT HEAD FINDINGS Skull and Sinuses:Negative for fracture or hemo sinus. Benign ground-glass density in the left  occipital bone that is small and stable. Visualized orbits: Gaze to the right, nonspecific. No evidence of orbital injury. Brain: No evidence of acute infarction, hemorrhage, hydrocephalus, or mass lesion/mass effect. There is advanced chronic ischemic injury with numerous bilateral cerebellar infarcts, largest area in the inferior left cerebellum. Multiple bilateral subcortical infarcts, most discrete in the  right thalamus and caudate head. Extensive fairly symmetric cortical infarct and atrophy, aligned in a watershed distribution. CT CERVICAL SPINE FINDINGS Negative for acute fracture or subluxation. No prevertebral edema. No gross cervical canal hematoma. Diffuse disc degeneration and spondylotic spurring. No indication of high-grade canal stenosis. IMPRESSION: 1. No evidence of acute intracranial or cervical spine injury. 2. Extensive chronic ischemic injury as described, stable from 12/21/2014 comparison. Electronically Signed   By: Monte Fantasia M.D.   On: 09/08/2015 11:04    Lab Data:  CBC:  Recent Labs Lab 09/08/15 1006 09/08/15 1607 09/09/15 1036 09/10/15 0651  WBC 7.2  --  7.9  7.5 9.8  NEUTROABS 5.5  --   --   --   HGB 7.3* 7.0* 9.0*  9.0* 9.0*  HCT 26.5* 25.9* 31.2*  31.3* 31.2*  MCV 69.6*  --  71.9*  72.1* 71.6*  PLT PLATELET CLUMPS NOTED ON SMEAR, UNABLE TO ESTIMATE  --  174  174 884   Basic Metabolic Panel:  Recent Labs Lab 09/08/15 1006 09/08/15 1902 09/09/15 1036 09/10/15 0651  NA 137  --  137 139  K 4.0  --  4.0 4.1  CL 107  --  103 109  CO2 22  --  22 22  GLUCOSE 218*  --  149* 75  BUN 15  --  15 14  CREATININE 1.31*  --  1.33* 1.16  CALCIUM 8.6*  --  8.3* 8.9  MG  --  1.7  --   --    GFR: Estimated Creatinine Clearance: 42.1 mL/min (by C-G formula based on Cr of 1.16). Liver Function Tests:  Recent Labs Lab 09/08/15 1006 09/09/15 1036  AST 27 20  ALT 35 28  ALKPHOS 86 84  BILITOT 0.8 1.8*  PROT 6.2* 6.1*  ALBUMIN 3.0* 2.9*   No results  for input(s): LIPASE, AMYLASE in the last 168 hours.  Recent Labs Lab 09/08/15 1006  AMMONIA 26   Coagulation Profile:  Recent Labs Lab 09/08/15 1045  INR 1.64*   Cardiac Enzymes:  Recent Labs Lab 09/08/15 1006 09/08/15 1045 09/08/15 1902 09/08/15 2312 09/09/15 1036  CKTOTAL  --  42*  --   --   --   TROPONINI <0.03  --  <0.03 <0.03 <0.03   BNP (last 3 results) No results for input(s): PROBNP in the last 8760 hours. HbA1C:  Recent Labs  09/08/15 1902  HGBA1C 6.8*   CBG:  Recent Labs Lab 09/09/15 0758 09/09/15 1158 09/09/15 1631 09/09/15 2107 09/10/15 0747  GLUCAP 82 116* 122* 80 81   Lipid Profile: No results for input(s): CHOL, HDL, LDLCALC, TRIG, CHOLHDL, LDLDIRECT in the last 72 hours. Thyroid Function Tests:  Recent Labs  09/08/15 1902  TSH 1.135   Anemia Panel:  Recent Labs  09/09/15 1036  VITAMINB12 355  FOLATE 10.7  FERRITIN 4*  TIBC 449  IRON 68  RETICCTPCT 0.8   Urine analysis:    Component Value Date/Time   COLORURINE YELLOW 09/08/2015 1145   APPEARANCEUR CLEAR 09/08/2015 1145   LABSPEC 1.025 09/08/2015 1145   PHURINE 6.0 09/08/2015 1145   GLUCOSEU NEGATIVE 09/08/2015 1145   HGBUR NEGATIVE 09/08/2015 1145   BILIRUBINUR NEGATIVE 09/08/2015 1145   KETONESUR NEGATIVE 09/08/2015 1145   PROTEINUR 100* 09/08/2015 1145   UROBILINOGEN 1.0 12/21/2014 1520   NITRITE NEGATIVE 09/08/2015 1145   LEUKOCYTESUR NEGATIVE 09/08/2015 1145     RAI,RIPUDEEP M.D. [facility] 09/10/2015, 10:44 AM  Pager: [phone] Between 7am to 7pm - call  Pager - [phone]  After 7pm go to [email or web address] - password [email or web address]  Call night coverage person covering after 7pm

## 2015-09-11 DIAGNOSIS — R531 Weakness: Secondary | ICD-10-CM | POA: Diagnosis not present

## 2015-09-11 DIAGNOSIS — D638 Anemia in other chronic diseases classified elsewhere: Secondary | ICD-10-CM | POA: Diagnosis not present

## 2015-09-11 DIAGNOSIS — E1100 Type 2 diabetes mellitus with hyperosmolarity without nonketotic hyperglycemic-hyperosmolar coma (NKHHC): Secondary | ICD-10-CM | POA: Diagnosis not present

## 2015-09-11 DIAGNOSIS — D508 Other iron deficiency anemias: Secondary | ICD-10-CM | POA: Diagnosis not present

## 2015-09-11 DIAGNOSIS — F0150 Vascular dementia without behavioral disturbance: Secondary | ICD-10-CM

## 2015-09-11 DIAGNOSIS — F419 Anxiety disorder, unspecified: Secondary | ICD-10-CM | POA: Diagnosis not present

## 2015-09-11 LAB — CBC
HEMATOCRIT: 30.1 % — AB (ref 39.0–52.0)
HEMOGLOBIN: 8.7 g/dL — AB (ref 13.0–17.0)
MCH: 21 pg — AB (ref 26.0–34.0)
MCHC: 28.9 g/dL — AB (ref 30.0–36.0)
MCV: 72.5 fL — ABNORMAL LOW (ref 78.0–100.0)
Platelets: 208 10*3/uL (ref 150–400)
RBC: 4.15 MIL/uL — ABNORMAL LOW (ref 4.22–5.81)
RDW: 18.1 % — AB (ref 11.5–15.5)
WBC: 10.4 10*3/uL (ref 4.0–10.5)

## 2015-09-11 LAB — GLUCOSE, CAPILLARY
GLUCOSE-CAPILLARY: 85 mg/dL (ref 65–99)
Glucose-Capillary: 73 mg/dL (ref 65–99)

## 2015-09-11 MED ORDER — ALPRAZOLAM 0.5 MG PO TABS: 0.5000 mg | ORAL_TABLET | Freq: Every evening | ORAL | Status: DC | PRN

## 2015-09-11 MED ORDER — LEVALBUTEROL HCL 0.63 MG/3ML IN NEBU: 0.6300 mg | INHALATION_SOLUTION | Freq: Four times a day (QID) | RESPIRATORY_TRACT | Status: DC | PRN

## 2015-09-11 MED ORDER — RIVAROXABAN 20 MG PO TABS: 20.0000 mg | ORAL_TABLET | Freq: Every day | ORAL | Status: DC

## 2015-09-11 MED ORDER — POLYETHYLENE GLYCOL 3350 17 G PO PACK
17.0000 g | PACK | Freq: Once | ORAL | Status: AC
  Administered 2015-09-11: 17 g via ORAL
  Filled 2015-09-11: qty 1

## 2015-09-11 MED ORDER — SENNOSIDES-DOCUSATE SODIUM 8.6-50 MG PO TABS: 1.0000 | ORAL_TABLET | Freq: Two times a day (BID) | ORAL | Status: DC

## 2015-09-11 NOTE — Discharge Summary (Signed)
Physician Discharge Summary   Patient ID: Grant Lara MRN: [id number] DOB/AGE: 1941-01-03 75 y.o.  Admit date: 09/08/2015 Discharge date: 09/11/2015  Primary Care Physician:  [name], MD  Discharge Diagnoses:    . Anemia Of chronic disease  . Memory loss . Vascular dementia . diabetes mellitus type 2    Lactic acidosis with dehydration    Dilated cardiomyopathy, chronic systolic CHF    History of pulmonary embolism /DVT on xarelto   Moderate protein calorie malnutrition  . Anxiety with failure to thrive  Consults: None  Recommendations for Outpatient Follow-up:  1. Patient had hemoglobin of 7.0 at the time of admission requiring 2 units packed RBCs subsequently remained stable throughout the hospitalization, no GI bleeding was noticed. Xarelto restarted.  2. Please repeat CBC/BMET at next visit, if H&H trending down, may need outpatient hematology evaluation 3. Aspirin has been discontinued   DIET: Carb modified diet    Allergies:  No Known Allergies   DISCHARGE MEDICATIONS: Current Discharge Medication List    START taking these medications   Details  levalbuterol (XOPENEX) 0.63 MG/3ML nebulizer solution Take 3 mLs (0.63 mg total) by nebulization every 6 (six) hours as needed for wheezing or shortness of breath. Qty: 3 mL, Refills: 12    senna-docusate (SENOKOT-S) 8.6-50 MG tablet Take 1 tablet by mouth 2 (two) times daily.      CONTINUE these medications which have CHANGED   Details  ALPRAZolam (XANAX) 0.5 MG tablet Take 1 tablet (0.5 mg total) by mouth at bedtime as needed. Qty: 20 tablet, Refills: 0      CONTINUE these medications which have NOT CHANGED   Details  acetaminophen (TYLENOL) 500 MG tablet Take 2 tablets (1,000 mg total) by mouth 2 (two) times daily. Qty: 90 tablet, Refills: prn   Associated Diagnoses: Bilateral lower extremity pain    atorvastatin (LIPITOR) 10 MG tablet Take 1 tablet (10 mg total) by mouth daily at 6 PM. Qty:  30 tablet, Refills: 0   Associated Diagnoses: Hyperlipidemia    donepezil (ARICEPT) 10 MG tablet Take 10 mg by mouth at bedtime.     latanoprost (XALATAN) 0.005 % ophthalmic solution Place 1 drop into both eyes at bedtime.    loratadine (CLARITIN) 10 MG tablet Take 10 mg by mouth daily as needed for allergies.    rivaroxaban (XARELTO) 20 MG TABS tablet Take 1 tablet (20 mg total) by mouth daily with supper. Qty: 30 tablet    sertraline (ZOLOFT) 50 MG tablet Take 50 mg by mouth every morning.     tamsulosin (FLOMAX) 0.4 MG CAPS capsule Take 1 capsule (0.4 mg total) by mouth daily. Qty: 30 capsule, Refills: 0      STOP taking these medications     aspirin 81 MG tablet          Brief H and P: For complete details please refer to admission H and P, but in brief  75 y.o. male with PMH of dementia, hypertension, hyperlipidemia, diabetes mellitus, depression, CAD, DVT, PE on Xarelto, PFO, chronic disease, stroke, BPH, systolic congestive heart failure (EF 20-25%), who presented with generalized weakness and increased urinary frequency brought in by EMS from [facility] for unwitnessed fall. Patient was on the floor for an unknown period of time, noted to have emesis x 3 after the episode. Because of dementia unable to obtain clear history. Pupils found to be sluggish, but reactive. No bradycardia seen on EKG. CBG 286. VS 99/64, hr 70 (paced), rr 16, o2  96% RA. Initial lactic acid 3.93 that improved after IV fluids, initial hemoglobin 7.3 subsequently 7.0. Stool guaiac-negative 2 in the ER. Patient is on Xarelto for history of pulmonary embolism. Unable to state if the patient has had any melena or hematochezia.  Hospital Course:   Anemia Of unclear etiology on xarelto prior to admission:  - Hemoglobin down to 7.0 at the time of admission, baseline 8-9, received 2 units packed RBC transfusion, hemoglobin has remained stable at 8.7 -9.0 - Stool guaiac-negative 2 in the ED, with rectal  exam and a large BM. Patient is followed by Dr.Buccini, colonoscopy in 2012 that did not show any colonic mass, has a history of Crohn's disease but the colonoscopy did not show any evidence of Crohn's. C. difficile negative. Patient did not have any further stools during hospitalization or any GI bleeding noticed. Xarelto was restarted, please follow CBC in next 3 days. If trending down, may need hematology evaluation outpatient.   Advanced dementia - CT head showed no evidence of acute intracranial or cervical spine injury, extensive chronic ischemic changes  - Continue Aricept    Diabetes mellitus type 2 -Hemoglobin A1c 6.8, continue sliding scale insulin   lactic acidosis - initially likely secondary to dehydration, resolved  Anxiety, failure to thrive -continue prn Xanax at bedtime  Dilated cardiomyopathy with an EF of 20-25% based on 2-D echo in February 2016 History of coronary artery disease with prior PCI to LAD in 2015 - Cardiac enzymes negative so far  History of pulmonary embolism chronically on Xarelto, after failing warfarin due to difficulty maintaining a therapeutic INR - Patient also had history of embolic CVA in 3/53, 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 Erlinda Hong)   [employer] AICD placed for primary prevention of SCD by Dr. Klein-interrogate because of recent falls  moderate protein calorie malnutrition   Day of Discharge BP 120/63 mmHg  Pulse 61  Temp(Src) 98.5 F (36.9 C) (Oral)  Resp 16  SpO2 96%  Physical Exam: General: Alert and awake oriented not in any acute distress. HEENT: anicteric sclera, pupils reactive to light and accommodation CVS: S1-S2 clear no murmur rubs or gallops Chest: clear to auscultation bilaterally, no wheezing rales or rhonchi Abdomen: soft nontender, nondistended, normal bowel sounds Extremities: no  cyanosis, clubbing or edema noted bilaterally    The results of significant diagnostics from this hospitalization (including imaging, microbiology, ancillary and laboratory) are listed below for reference.    LAB RESULTS: Basic Metabolic Panel:  Recent Labs Lab 09/08/15 1902 09/09/15 1036 09/10/15 0651  NA  --  137 139  K  --  4.0 4.1  CL  --  103 109  CO2  --  22 22  GLUCOSE  --  149* 75  BUN  --  15 14  CREATININE  --  1.33* 1.16  CALCIUM  --  8.3* 8.9  MG 1.7  --   --    Liver Function Tests:  Recent Labs Lab 09/08/15 1006 09/09/15 1036  AST 27 20  ALT 35 28  ALKPHOS 86 84  BILITOT 0.8 1.8*  PROT 6.2* 6.1*  ALBUMIN 3.0* 2.9*   No results for input(s): LIPASE, AMYLASE in the last 168 hours.  Recent Labs Lab 09/08/15 1006  AMMONIA 26   CBC:  Recent Labs Lab 09/08/15 1006  09/10/15 0651 09/11/15 0432  WBC 7.2  < > 9.8  10.4  NEUTROABS 5.5  --   --   --   HGB 7.3*  < > 9.0* 8.7*  HCT 26.5*  < > 31.2* 30.1*  MCV 69.6*  < > 71.6* 72.5*  PLT PLATELET CLUMPS NOTED ON SMEAR, UNABLE TO ESTIMATE  < > 179 208  < > = values in this interval not displayed. Cardiac Enzymes:  Recent Labs Lab 09/08/15 1045  09/08/15 2312 09/09/15 1036  CKTOTAL 42*  --   --   --   TROPONINI  --   < > <0.03 <0.03  < > = values in this interval not displayed. BNP: Invalid input(s): POCBNP CBG:  Recent Labs Lab 09/10/15 2107 09/11/15 0815  GLUCAP 149* 73    Significant Diagnostic Studies:  Dg Chest 2 View  09/08/2015  CLINICAL DATA:  Shortness of breath and weakness. EXAM: CHEST  2 VIEW COMPARISON:  12/21/2014 FINDINGS: Left-sided pacemaker unchanged. Lungs are hyperexpanded without focal consolidation or effusion. Cardiomediastinal silhouette is within normal. Remainder of the exam is unchanged. IMPRESSION: No acute cardiopulmonary disease. COPD. Electronically Signed   By: Marin Olp M.D.   On: 09/08/2015 11:20   Abd 1 View (kub)  09/08/2015  CLINICAL DATA:   75 year old male with diabetes, high blood pressure and hyperlipidemia presenting with generalized weakness and increased urinary frequency. On witnessed fall. Initial encounter. EXAM: ABDOMEN - 1 VIEW COMPARISON:  05/02/2014 and 07/16/2013. FINDINGS: No evidence of bowel obstruction. The possibility of free intraperitoneal air cannot be assessed on a supine view. No acute fracture noted. Vascular calcifications. IMPRESSION: No evidence of bowel obstruction. No acute fracture noted. Electronically Signed   By: Genia Del M.D.   On: 09/08/2015 23:37   Ct Head Wo Contrast  09/08/2015  CLINICAL DATA:  Unwitnessed fall at nursing home. Initial encounter. EXAM: CT HEAD WITHOUT CONTRAST CT CERVICAL SPINE WITHOUT CONTRAST TECHNIQUE: Multidetector CT imaging of the head and cervical spine was performed following the standard protocol without intravenous contrast. Multiplanar CT image reconstructions of the cervical spine were also generated. COMPARISON:  Head CT 12/21/2014 FINDINGS: CT HEAD FINDINGS Skull and Sinuses:Negative for fracture or hemo sinus. Benign ground-glass density in the left occipital bone that is small and stable. Visualized orbits: Gaze to the right, nonspecific. No evidence of orbital injury. Brain: No evidence of acute infarction, hemorrhage, hydrocephalus, or mass lesion/mass effect. There is advanced chronic ischemic injury with numerous bilateral cerebellar infarcts, largest area in the inferior left cerebellum. Multiple bilateral subcortical infarcts, most discrete in the right thalamus and caudate head. Extensive fairly symmetric cortical infarct and atrophy, aligned in a watershed distribution. CT CERVICAL SPINE FINDINGS Negative for acute fracture or subluxation. No prevertebral edema. No gross cervical canal hematoma. Diffuse disc degeneration and spondylotic spurring. No indication of high-grade canal stenosis. IMPRESSION: 1. No evidence of acute intracranial or cervical spine injury. 2.  Extensive chronic ischemic injury as described, stable from 12/21/2014 comparison. Electronically Signed   By: Monte Fantasia M.D.   On: 09/08/2015 11:04   Ct Cervical Spine Wo Contrast  09/08/2015  CLINICAL DATA:  Unwitnessed fall at nursing home. Initial encounter. EXAM: CT HEAD WITHOUT CONTRAST CT CERVICAL SPINE WITHOUT CONTRAST TECHNIQUE: Multidetector CT imaging of the head and cervical spine was performed following the standard protocol without intravenous contrast. Multiplanar CT image reconstructions of the cervical spine were also generated. COMPARISON:  Head CT 12/21/2014 FINDINGS: CT HEAD FINDINGS Skull and Sinuses:Negative for fracture or hemo sinus. Benign ground-glass density in the left occipital bone that  is small and stable. Visualized orbits: Gaze to the right, nonspecific. No evidence of orbital injury. Brain: No evidence of acute infarction, hemorrhage, hydrocephalus, or mass lesion/mass effect. There is advanced chronic ischemic injury with numerous bilateral cerebellar infarcts, largest area in the inferior left cerebellum. Multiple bilateral subcortical infarcts, most discrete in the right thalamus and caudate head. Extensive fairly symmetric cortical infarct and atrophy, aligned in a watershed distribution. CT CERVICAL SPINE FINDINGS Negative for acute fracture or subluxation. No prevertebral edema. No gross cervical canal hematoma. Diffuse disc degeneration and spondylotic spurring. No indication of high-grade canal stenosis. IMPRESSION: 1. No evidence of acute intracranial or cervical spine injury. 2. Extensive chronic ischemic injury as described, stable from 12/21/2014 comparison. Electronically Signed   By: Monte Fantasia M.D.   On: 09/08/2015 11:04    2D ECHO:   Disposition and Follow-up:    DISPOSITION: [place] Follow-up Information    Follow up with [name], MD. Schedule an appointment as soon as possible for a  visit in 2 weeks.   Specialty:  Family Medicine   Why:  for hospital follow-up, obtain labs CBC   Contact information:   [place] [phone]        Time spent on Discharge: 35 minutes  Signed:   Dain Laseter M.D. [facility] 09/11/2015, 11:17 AM Pager: [phone]

## 2015-09-11 NOTE — Progress Notes (Signed)
Central tele notified RN of 5 beat run of Vtach with PVC's. Patient asymptomatic no complaints of pain.Dr. Real Cons  Made aware of patient status.

## 2015-09-11 NOTE — NC FL2 (Signed)
[place] LEVEL OF CARE SCREENING TOOL     IDENTIFICATION  Patient Name: Grant Lara Birthdate: 01-29-1941 Sex: male Admission Date (Current Location): 09/08/2015  [facility] and [place] Number:  Herbalist and Address:  [facility], [place] [address], [place], [place] [address]      Provider Number: [id number]  Attending Physician Name and Address:  Mendel Corning, MD  Relative Name and Phone Number:  [name], spouse, [phone]    Current Level of Care: Hospital Recommended Level of Care: [place] Prior Approval Number:    Date Approved/Denied:   PASRR Number: [id number]  Discharge Plan: SNF    Current Diagnoses: Patient Active Problem List   Diagnosis Date Noted  . Anemia 09/08/2015  . Fall 09/08/2015  . Depression 07/15/2015  . OA (osteoarthritis) 06/06/2015  . Bilateral lower extremity pain 05/14/2015  . BPH (benign prostatic hyperplasia) 04/10/2015  . Anxiety 04/10/2015  . FTT (failure to thrive) in adult 11/05/2014  . Mental status change 10/30/2014  . Protein-calorie malnutrition, severe ([place]) 10/30/2014  . Increased urinary frequency 10/30/2014  . Faintness   . Vascular dementia 09/01/2014  . Generalized weakness 09/01/2014  . 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 Cardiac syncope   . Other specified hypotension   . Chronic systolic CHF (congestive heart failure) ([place])   . Hypotension 07/16/2014  . Hypothermia 07/16/2014  . SIRS (systemic inflammatory response syndrome) ([place]) 07/16/2014  . Pulmonary embolus ([place]) 07/16/2014  . Late effects of CVA (cerebrovascular accident) 07/16/2014  . Bradycardia 06/01/2014  . Syncope 05/30/2014  . Stroke ([place])   . CVA (cerebral infarction)   . Hyperlipidemia   . Coronary artery disease involving native coronary artery of native heart without angina pectoris   . LVH (left ventricular hypertrophy)   . NSVT (nonsustained ventricular tachycardia) ([place])  04/16/2014  . CKD (chronic kidney disease) stage 3, GFR 30-59 ml/min 04/16/2014  . Aphasia 04/15/2014  . Abnormal EKG   . Anemia, chronic disease 11/21/2013  . DM2 (diabetes mellitus, type 2) ([place]) 11/20/2013  . Hx pulmonary embolism 11/20/2013  . Leukocytosis 11/20/2013  . Hyperglycemia 08/22/2013  . Dehydration 08/21/2013  . ARF (acute renal failure) ([place]) 08/21/2013  . Abnormal ECG 08/21/2013  . Delirium 07/18/2013  . Small bowel obstruction ([place]) 07/13/2013  . Encounter for therapeutic drug monitoring 05/11/2013  . Crohn's disease ([place]) 02/16/2012  . Abnormal CT scan of lung 11/08/2011  . Bilateral leg and foot pain 11/06/2011  . SBO (small bowel obstruction) ([place]) 11/05/2011  . HLD (hyperlipidemia) 03/28/2011  . Pulmonary embolism ([place]) 06/05/2010  . History of embolic stroke 81/82/9937  . DVT (deep venous thrombosis) ([place]) 06/05/2010  . EMPHYSEMATOUS BLEB 04/06/2010  . C O P D 04/05/2010  . Chronic systolic heart failure ([place]) 01/10/2010  . Memory loss 04/03/2009  . CHEST PAIN 03/08/2009  . Hypertensive heart disease with CHF (congestive heart failure) ([place]) 02/10/2009  . TOBACCO ABUSE 12/12/2008  . CARDIOMYOPATHY 12/12/2008  . ABNORMAL ELECTROCARDIOGRAM 12/12/2008    Orientation RESPIRATION BLADDER Height & Weight     Self, Time, Situation, Place  Normal Continent, Indwelling catheter (Urinary catheter) Weight:   Height:     BEHAVIORAL SYMPTOMS/MOOD NEUROLOGICAL BOWEL NUTRITION STATUS      Incontinent Diet (Please see DC Summary)  AMBULATORY STATUS COMMUNICATION OF NEEDS Skin   Extensive Assist Verbally Normal  Personal Care Assistance Level of Assistance  Bathing, Feeding, Dressing Bathing Assistance: Maximum assistance Feeding assistance: Maximum assistance Dressing Assistance: Independent     Functional Limitations Info             SPECIAL CARE FACTORS FREQUENCY                       Contractures       Additional Factors Info  Code Status, Allergies Code Status Info: Full Allergies Info: NKA           Current Medications (09/11/2015):  This is the current hospital active medication list Current Facility-Administered Medications  Medication Dose Route Frequency Provider Last Rate Last Dose  . 0.9 %  sodium chloride infusion   Intravenous Once Isla Pence, MD      . acetaminophen (TYLENOL) tablet 650 mg  650 mg Oral Q6H PRN Reyne Dumas, MD       Or  . acetaminophen (TYLENOL) suppository 650 mg  650 mg Rectal Q6H PRN Reyne Dumas, MD      . ALPRAZolam Duanne Moron) tablet 0.25 mg  0.25 mg Oral QHS PRN Reyne Dumas, MD      . atorvastatin (LIPITOR) tablet 10 mg  10 mg Oral q1800 Reyne Dumas, MD   10 mg at 09/10/15 1837  . donepezil (ARICEPT) tablet 10 mg  10 mg Oral QHS Reyne Dumas, MD   10 mg at 09/10/15 2216  . insulin aspart (novoLOG) injection 0-9 Units  0-9 Units Subcutaneous TID WC Reyne Dumas, MD   1 Units at 09/10/15 1800  . latanoprost (XALATAN) 0.005 % ophthalmic solution 1 drop  1 drop Both Eyes QHS Reyne Dumas, MD   1 drop at 09/10/15 2216  . levalbuterol (XOPENEX) nebulizer solution 0.63 mg  0.63 mg Nebulization Q6H PRN Reyne Dumas, MD      . ondansetron (ZOFRAN) tablet 4 mg  4 mg Oral Q6H PRN Reyne Dumas, MD       Or  . ondansetron (ZOFRAN) injection 4 mg  4 mg Intravenous Q6H PRN Reyne Dumas, MD      . polyethylene glycol (MIRALAX / GLYCOLAX) packet 17 g  17 g Oral Once Ripudeep K Rai, MD      . rivaroxaban (XARELTO) tablet 20 mg  20 mg Oral Q supper Ripudeep K Rai, MD      . senna-docusate (Senokot-S) tablet 1 tablet  1 tablet Oral BID Ripudeep Krystal Eaton, MD   1 tablet at 09/10/15 2216  . sertraline (ZOLOFT) tablet 50 mg  50 mg Oral Ivor Reining, MD   50 mg at 09/11/15 0704  . sodium chloride flush (NS) 0.9 % injection 3 mL  3 mL Intravenous Q12H Reyne Dumas, MD   3 mL at 09/10/15 2213  . tamsulosin (FLOMAX) capsule 0.4 mg  0.4 mg Oral Daily Reyne Dumas, MD    0.4 mg at 09/10/15 0920     Discharge Medications: Please see discharge summary for a list of discharge medications.  Relevant Imaging Results:  Relevant Lab Results:   Additional Information SSN:[id number] [place], LCSWA

## 2015-09-11 NOTE — Clinical Social Work Note (Signed)
Clinical Social Work Assessment  Patient Details  Name: Grant Lara MRN: [id number] Date of Birth: 25-Aug-1940  Date of referral:  09/11/15               Reason for consult:  Facility Placement                Permission sought to share information with:  Facility Sport and exercise psychologist, Family Supports Permission granted to share information::  Yes, Verbal Permission Granted  Name::     [name]  Agency::  [facility]  Relationship::  Spouse  Contact Information:  [phone]  Housing/Transportation Living arrangements for the past 2 months:  [place], [place] of Information:  Patient, Spouse Patient Interpreter Needed:  None Criminal Activity/Legal Involvement Pertinent to Current Situation/Hospitalization:  No - Comment as needed Significant Relationships:  Adult Children, Spouse Lives with:  Self, Spouse Do you feel safe going back to the place where you live?  Yes Need for family participation in patient care:  Yes (Comment)  Care giving concerns:  CSW received referral for patient to return to SNF. CSW spoke with patient's wife, as patient is a bit disoriented and states that he came from home. Per patient's wife, patient is from [place] and plan is to return there. CSW to continue to follow and assist with discharge planning needs.   Social Worker assessment / plan:  CSW spoke with patient and patient's wife concerning returning to SNF.  Employment status:  Retired Nurse, adult PT Recommendations:  Not assessed at this time Information / Referral to community resources:  [place]  Patient/Family's Response to care:  Patient's spouse reported agreement with plan to return to [facility] [place]. She stated that she had a medical appointment and would go to see her husband once he returned to the facility.  Patient/Family's Understanding of and Emotional Response to Diagnosis, Current Treatment, and  Prognosis:  Patient is realistic regarding therapy needs. No questions/concerns about plan or treatment.    Emotional Assessment Appearance:  Appears stated age Attitude/Demeanor/Rapport:  Other (Appropriate) Affect (typically observed):  Accepting, Appropriate Orientation:  Oriented to Self, Oriented to Place, Oriented to  Time, Oriented to Situation Alcohol / Substance use:  Not Applicable Psych involvement (Current and /or in the community):  No (Comment)  Discharge Needs  Concerns to be addressed:  Care Coordination Readmission within the last 30 days:  No Current discharge risk:  None Barriers to Discharge:  No Barriers Identified   Benard Halsted, [place] 09/11/2015, 9:54 AM

## 2015-09-11 NOTE — Evaluation (Signed)
Physical Therapy Evaluation Patient Details Name: MADHAV MOHON MRN: [id number] DOB: 27-Jul-1940 Today's Date: 09/11/2015   History of Present Illness  75 yo male admitted with anemia, fall. hx of dementia. HTN, DM, CAD, DVT, PE, CVA, CHF. Pt is from SNF  Clinical Impression  On eval, pt required Min assist for mobility-performed stand pivot x2 with RW, bed<>bsc. Mobility limited by R ankle pain. Noted pain with attempts at ROM and WBing (left sticky note in chart for MD). No family present during session. Recommend return to SNF.     Follow Up Recommendations SNF    Equipment Recommendations  Rolling walker with 5" wheels    Recommendations for Other Services       Precautions / Restrictions Precautions Precautions: Fall Restrictions Weight Bearing Restrictions: No      Mobility  Bed Mobility Overal bed mobility: Needs Assistance Bed Mobility: Supine to Sit     Supine to sit: Min assist     General bed mobility comments: Assist for trunk. Increased time. Multmodal cues for completion of task.  Transfers Overall transfer level: Needs assistance Equipment used: Rolling walker (2 wheeled) Transfers: Sit to/from [facility] Sit to Stand: Min assist Stand pivot transfers: Min assist       General transfer comment: Assist to rise, stabilize, control descent. Multimodal cues for hand placement, safety, technique. Stand pivot x2 , bed <>bsc, with RW  Ambulation/Gait             General Gait Details: NT-pt c/o R ankle pain with pivot  Stairs            Wheelchair Mobility    Modified Rankin (Stroke Patients Only)       Balance Overall balance assessment: Needs assistance;History of Falls         Standing balance support: Bilateral upper extremity supported;During functional activity Standing balance-Leahy Scale: Poor                               Pertinent Vitals/Pain Pain Assessment: Faces Faces Pain Scale: Hurts  even more Pain Location: R ankle with attempts at ROM and WBing Pain Descriptors / Indicators: Sore;Tender Pain Intervention(s): Limited activity within patient's tolerance;Repositioned    Home Living Family/patient expects to be discharged to:: Skilled nursing facility                 Additional Comments: pt with expressive difficulties and no family present    Prior Function Level of Independence: Needs assistance               Hand Dominance        Extremity/Trunk Assessment   Upper Extremity Assessment: Overall WFL for tasks assessed           Lower Extremity Assessment: Generalized weakness      Cervical / Trunk Assessment: Normal  Communication   Communication: Expressive difficulties  Cognition Arousal/Alertness: Awake/alert Behavior During Therapy: WFL for tasks assessed/performed Overall Cognitive Status: History of cognitive impairments - at baseline                      General Comments      Exercises        Assessment/Plan    PT Assessment Patient needs continued PT services  PT Diagnosis Difficulty walking;Abnormality of gait;Generalized weakness;Acute pain   PT Problem List Decreased strength;Decreased range of motion;Decreased activity tolerance;Decreased balance;Decreased mobility;Decreased knowledge of use of DME;Pain  PT Treatment Interventions DME instruction;Gait training;Functional mobility training;Therapeutic activities;Patient/family education;Balance training;Therapeutic exercise   PT Goals (Current goals can be found in the Care Plan section) Acute Rehab PT Goals Patient Stated Goal: none stated PT Goal Formulation: With patient Time For Goal Achievement: 09/25/15 Potential to Achieve Goals: Good    Frequency Min 3X/week   Barriers to discharge        Co-evaluation               End of Session Equipment Utilized During Treatment: Gait belt Activity Tolerance: Patient limited by pain Patient  left: in bed;with call bell/phone within reach;with bed alarm set           Time: 0922-0955 PT Time Calculation (min) (ACUTE ONLY): 33 min   Charges:   PT Evaluation $PT Eval Low Complexity: 1 Procedure PT Treatments $Therapeutic Activity: 8-22 mins   PT G Codes:        Weston Anna, MPT Pager: [phone]

## 2015-09-11 NOTE — Progress Notes (Signed)
Patient will DC to: [facility] Anticipated DC date: 09/11/15 Family notified: Spouse by phone Transport by: Corey Harold   Per MD patient ready for DC back to [facility]. RN, patient, patient's family, and facility notified of DC. RN given number for report. DC packet on chart. Ambulance transport requested for patient.   CSW signing off.  Cedric Fishman, [place] Social Worker [phone]

## 2015-09-11 NOTE — Care Management Important Message (Signed)
Important Message  Patient Details  Name: Grant Lara MRN: [id number] Date of Birth: 1940-09-07   Medicare Important Message Given:  Yes    [name] P [place] 09/11/2015, 10:02 AM

## 2015-09-12 ENCOUNTER — Non-Acute Institutional Stay (SKILLED_NURSING_FACILITY): Payer: Medicare Other | Admitting: Adult Health

## 2015-09-12 ENCOUNTER — Encounter: Payer: Self-pay | Admitting: Adult Health

## 2015-09-12 DIAGNOSIS — I2782 Chronic pulmonary embolism: Secondary | ICD-10-CM | POA: Diagnosis not present

## 2015-09-12 DIAGNOSIS — F0150 Vascular dementia without behavioral disturbance: Secondary | ICD-10-CM

## 2015-09-12 DIAGNOSIS — I638 Other cerebral infarction: Secondary | ICD-10-CM

## 2015-09-12 DIAGNOSIS — J449 Chronic obstructive pulmonary disease, unspecified: Secondary | ICD-10-CM

## 2015-09-12 DIAGNOSIS — I5022 Chronic systolic (congestive) heart failure: Secondary | ICD-10-CM | POA: Diagnosis not present

## 2015-09-12 DIAGNOSIS — I6389 Other cerebral infarction: Secondary | ICD-10-CM

## 2015-09-12 DIAGNOSIS — I2510 Atherosclerotic heart disease of native coronary artery without angina pectoris: Secondary | ICD-10-CM | POA: Diagnosis not present

## 2015-09-12 NOTE — Progress Notes (Signed)
Patient ID: Grant Lara, male   DOB: 1941/03/03, 75 y.o.   MRN: [id number]   Location:  [facility] Room Number: 694-H Place of Service:  SNF (31)   CODE STATUS: Full Code  No Known Allergies  Chief Complaint  Patient presents with  . Hospitalization Follow-up    Hospital follow up    HPI:  He was hospitalized after a fall at this facility. He was found to have a hgb 7.3 with + guaiac stools. He was transfused. He has returned back to the facility. He tells me that he is feeling much better. He has no complaints. There are no nursing concerns at this time.   Past Medical History  Diagnosis Date  . Hypertension   . Dilated cardiomyopathy ([place])     2/12: EF 30-35%, trivial AI, mild RAE.  EF 2014 40 -45%  . CAD (coronary artery disease)     LHC 9/05 with Dr. Einar Gip:  dLM 20-30%, LAD 85%, oD1 20-30%.  PCI:  Taxus DES to LAD; Dx jailed and tx with POBA.  Last myoview 12/10: inf scar, no ischemia, EF 29%.  . DVT (deep venous thrombosis) ([place])   . Pulmonary embolus ([facility])     chronic coumadin  . 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 PFO (patent foramen ovale)     Not mentioned on 2014 echo.  [name] HLD (hyperlipidemia)   . Crohn's disease ([place])   . Nephrolithiasis   . Diabetes mellitus     11/05/11 "borderline; don't take medications"  . Stroke [facility]) 1993    "left arm can't hold steady; leg too"  . Arthritis     "used to have a touch in my legs"  . Memory difficulties   . Pneumonia ~ 2011    09-22-13 denies any recent SOB or breathing problems  . Swelling of both ankles      09-22-13 occ.feet, but denies pain.  . Bradycardia 06/01/2014  . Dementia   . Hyperlipidemia   . CHF (congestive heart failure) [facility])     Past Surgical History  Procedure Laterality Date  . Colon surgery  1994; 1996    "for Crohn's disease"  . Appendectomy    . Implantable cardioverter defibrillator implant N/A 06/02/2014    Procedure: IMPLANTABLE CARDIOVERTER DEFIBRILLATOR IMPLANT;   Surgeon: [name], MD;  Location: [facility] CATH LAB;  Service: Cardiovascular;  Laterality: N/A;    Social History   Social History  . Marital Status: Married    Spouse Name: N/A  . Number of Children: N/A  . Years of Education: N/A   Occupational History  . Not on file.   Social History Main Topics  . Smoking status: Current Some Day Smoker -- 0.50 packs/day for 53 years    Types: Cigarettes  . Smokeless tobacco: Never Used  . Alcohol Use: No  . Drug Use: No  . Sexual Activity: Not Currently   Other Topics Concern  . Not on file   Social History Narrative   Family History  Problem Relation Age of Onset  . Diabetes type II Mother   . CAD Father   . Diabetes type II Brother       VITAL SIGNS BP 120/64 mmHg  Pulse 68  Temp(Src) 98 F (36.7 C) (Oral)  Resp 18  Ht 5' 3"  (1.6 m)  Wt 121 lb (54.885 kg)  BMI 21.44 kg/m2  SpO2 100%  Patient's Medications  New Prescriptions   No medications on file  Previous Medications   ACETAMINOPHEN (TYLENOL) 500 MG TABLET    Take 2 tablets (1,000 mg total) by mouth 2 (two) times daily.   ALPRAZOLAM (XANAX) 0.5 MG TABLET    Take 1 tablet (0.5 mg total) by mouth at bedtime as needed.   ASPIRIN 81 MG TABLET    Take 81 mg by mouth daily.   ATORVASTATIN (LIPITOR) 10 MG TABLET    Take 1 tablet (10 mg total) by mouth daily at 6 PM.   DONEPEZIL (ARICEPT) 10 MG TABLET    Take 10 mg by mouth at bedtime.    LATANOPROST (XALATAN) 0.005 % OPHTHALMIC SOLUTION    Place 1 drop into both eyes at bedtime.   LEVALBUTEROL (XOPENEX) 0.63 MG/3ML NEBULIZER SOLUTION    Take 3 mLs (0.63 mg total) by nebulization every 6 (six) hours as needed for wheezing or shortness of breath.   LORATADINE (CLARITIN) 10 MG TABLET    Take 10 mg by mouth daily as needed for allergies.   RIVAROXABAN (XARELTO) 20 MG TABS TABLET    Take 1 tablet (20 mg total) by mouth daily with supper.   SENNA-DOCUSATE (SENOKOT-S) 8.6-50 MG TABLET    Take 1 tablet by mouth 2 (two) times  daily.   SERTRALINE (ZOLOFT) 50 MG TABLET    Take 50 mg by mouth every morning.    TAMSULOSIN (FLOMAX) 0.4 MG CAPS CAPSULE    Take 1 capsule (0.4 mg total) by mouth daily.  Modified Medications   No medications on file  Discontinued Medications   No medications on file     SIGNIFICANT DIAGNOSTIC EXAMS  09-08-15: KUB: No evidence of bowel obstruction. No acute fracture noted.  09-08-15: chest x-ray: No acute cardiopulmonary disease. COPD.  09-08-15: ct of head and cervical spine: 1. No evidence of acute intracranial or cervical spine injury. 2. Extensive chronic ischemic injury as described, stable from 12/21/2014 comparison.  09-09-15: EEG: Clinical Interpretation: This essentially normal EEG is recorded in the waking and sleep state. There was no seizure or seizure predisposition recorded on this study. Please note that a normal EEG does not preclude the possibility of epilepsy.   LABS REVIEWED:   03-29-15: wbc 4.9; hgb 9.7; hct 32.2; mcv 78.0; plt 247; glucose 97; bun 12; creat 1.11; k+ 4.0; na++141  09-08-15: wbc 7.2; hgb 7.3; hct 26.5; mcv 69.6; plt clump; glucose 218; bun 15; creat 1.31; k+ 4.0; na++ 137; liver normal albumin 3.0; mag 1.7; hgb a1c 6.8; tsh 1.135 09-10-15: wbc 9.8; hgb 9.0; hxct 31.2. mcv 71.6; plt 179; glucose 75; bun 14; creat 1.16; k+ 4.1; na++ 139 09-11-15: wbc 10.4; hg 8.7; hct 30.1; mcv 72.5; plt 208      Review of Systems  Unable to perform ROS: dementia    Physical Exam  Constitutional: No distress.  Frail   Eyes: Conjunctivae are normal.  Neck: Neck supple. No JVD present. No thyromegaly present.  Cardiovascular: Normal rate, regular rhythm and intact distal pulses.   Respiratory: Effort normal and breath sounds normal. No respiratory distress. He has no wheezes.  GI: Soft. Bowel sounds are normal. He exhibits no distension. There is no tenderness.  Musculoskeletal: He exhibits no edema.  Left hemiparesis  Lymphadenopathy:    He has no cervical  adenopathy.  Neurological: He is alert.  Skin: Skin is warm and dry. He is not diaphoretic.  Psychiatric: He has a normal mood and affect.      ASSESSMENT/ PLAN:  1.  Bilateral lower extremity pain: will  continue tylenol 1 gm twice daily for pain and will monitor his status   2. Dyslipidemia: will continue lipitor 10 mg daily   3. Dementia: without significant change; his current weight is 121 pounds; will continue aricept 10 mg nightly   4. BPH: will continue flomax 0.4 mg daily   5. Chronic diastoic heart failure: EF 20-25% (Feb 2016)  is currently not on medications; will not make changes will monitor  6. PE: is stable will continue xarelto 20 mg daily   7. CVA: is neurologically stable will continue xarelto 20 mg daily   8. Depression: will continue zoloft 50 mg daily and takes xanax 0.5 mg nightly for anxiety  As needed   9. CAD: no indications of chest pain present; will continue asa 81 mg daily  10. Glaucoma: will continue xalatan to both eyes.     Will check cbc; cmp   Time spent with patient  50   minutes >50% time spent counseling; reviewing medical record; tests; labs; and developing future plan of care    Ok Edwards NP [facility]  Contact [phone] Monday through Friday 8am- 5pm  After hours call [phone]

## 2015-09-13 ENCOUNTER — Non-Acute Institutional Stay (SKILLED_NURSING_FACILITY): Payer: Medicare Other | Admitting: Internal Medicine

## 2015-09-13 ENCOUNTER — Encounter: Payer: Self-pay | Admitting: Internal Medicine

## 2015-09-13 DIAGNOSIS — I420 Dilated cardiomyopathy: Secondary | ICD-10-CM | POA: Diagnosis not present

## 2015-09-13 DIAGNOSIS — R627 Adult failure to thrive: Secondary | ICD-10-CM | POA: Diagnosis not present

## 2015-09-13 DIAGNOSIS — D508 Other iron deficiency anemias: Secondary | ICD-10-CM | POA: Diagnosis not present

## 2015-09-13 DIAGNOSIS — E1122 Type 2 diabetes mellitus with diabetic chronic kidney disease: Secondary | ICD-10-CM | POA: Diagnosis not present

## 2015-09-13 DIAGNOSIS — F0150 Vascular dementia without behavioral disturbance: Secondary | ICD-10-CM | POA: Diagnosis not present

## 2015-09-13 DIAGNOSIS — Z95810 Presence of automatic (implantable) cardiac defibrillator: Secondary | ICD-10-CM

## 2015-09-13 DIAGNOSIS — Z86711 Personal history of pulmonary embolism: Secondary | ICD-10-CM

## 2015-09-13 DIAGNOSIS — E440 Moderate protein-calorie malnutrition: Secondary | ICD-10-CM

## 2015-09-13 DIAGNOSIS — F419 Anxiety disorder, unspecified: Secondary | ICD-10-CM | POA: Diagnosis not present

## 2015-09-13 DIAGNOSIS — E872 Acidosis, unspecified: Secondary | ICD-10-CM

## 2015-09-13 DIAGNOSIS — N183 Chronic kidney disease, stage 3 (moderate): Secondary | ICD-10-CM

## 2015-09-13 LAB — CULTURE, BLOOD (ROUTINE X 2)
CULTURE: NO GROWTH
Culture: NO GROWTH

## 2015-09-13 NOTE — Progress Notes (Signed)
MRN: [id number] Name: Grant Lara [place]  Sex: male Age: 75 y.o. DOB: [birth year]  [place] #: [name] Facility/Room: 223 A Level Of Care: SNF Provider: Noah Delaine. Sheppard Coil, MD Emergency Contacts: Extended Emergency Contact Information Primary Emergency Contact: Hippert,[name] Address: [address]          [place], [place] [address] [name] of [place] Phone: [phone] Mobile Phone: [phone] Relation: Spouse Secondary Emergency Contact: [name],[name]          [place], [place] of [place] Mobile Phone: [phone] Relation: Daughter  Code Status: Full Code  Allergies: Review of patient's allergies indicates no known allergies.  Chief Complaint  Patient presents with  . New Admit To SNF    Admission to facility    HPI: Patient is 75 y.o. male with dementia, hypertension, hyperlipidemia, diabetes mellitus, depression, CAD, DVT, PE on Xarelto, PFO, chronic disease, stroke, BPH, systolic congestive heart failure (EF 20-25%), who presented with generalized weakness and increased urinary frequency brought in by EMS from [facility] for unwitnessed fall. Patient was on the floor for an unknown period of time, noted to have emesis x 3 after the episode. Because of dementia unable to obtain clear history. Pupils found to be sluggish, but reactive. No bradycardia seen on EKG. CBG 286. VS 99/64, hr 70 (paced), rr 16, o2 96% RA. Initial lactic acid 3.93 that improved after IV fluids, initial hemoglobin 7.3 subsequently 7.0. Stool guaiac-negative 2 in the ER. Patient is on Xarelto for history of pulmonary embolism. Unable to state if the patient has had any melena or hematochezia. Pt was admitted to [facility] from 5/26-29 where no source was found for bleeding and pt received a transfusion of 2 u and once HB remained stable he was d/c to SNF. Pt is admitted to SNF with generalized weakness and for residential care. While at SNF pt will be followed for dementia, tx with aricept, anxiety, tx  with xanax and DM, tx with SSI.  Past Medical History  Diagnosis Date  . Hypertension   . Dilated cardiomyopathy ([place])     2/12: EF 30-35%, trivial AI, mild RAE.  EF 2014 40 -45%  . CAD (coronary artery disease)     LHC 9/05 with Dr. Einar Gip:  dLM 20-30%, LAD 85%, oD1 20-30%.  PCI:  Taxus DES to LAD; Dx jailed and tx with POBA.  Last myoview 12/10: inf scar, no ischemia, EF 29%.  . DVT (deep venous thrombosis) ([place])   . Pulmonary embolus (HCC)     chronic coumadin  . 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 PFO (patent foramen ovale)     Not mentioned on 2014 echo.  [name] HLD (hyperlipidemia)   . Crohn's disease ([place])   . Nephrolithiasis   . Diabetes mellitus     11/05/11 "borderline; don't take medications"  . Stroke [facility]) 1993    "left arm can't hold steady; leg too"  . Arthritis     "used to have a touch in my legs"  . Memory difficulties   . Swelling of both ankles      09-22-13 occ.feet, but denies pain.  . Bradycardia 06/01/2014  . Dementia   . Hyperlipidemia   . CHF (congestive heart failure) ([place])   . Pneumonia ~ 2011    09-22-13 denies any recent SOB or breathing problems  . HCAP (healthcare-associated pneumonia) 09/29/2015  . AICD (automatic cardioverter/defibrillator) present     Past Surgical History  Procedure Laterality Date  . Colon surgery  1994; 1996    "for  Crohn's disease"  . Appendectomy    . Implantable cardioverter defibrillator implant N/A 06/02/2014    Procedure: IMPLANTABLE CARDIOVERTER DEFIBRILLATOR IMPLANT;  Surgeon: [name], MD;  Location: [facility] CATH LAB;  Service: Cardiovascular;  Laterality: N/A;      Medication List       This list is accurate as of: 09/13/15 11:59 PM.  Always use your most recent med list.               acetaminophen 500 MG tablet  Commonly known as:  TYLENOL  Take 2 tablets (1,000 mg total) by mouth 2 (two) times daily.     ALPRAZolam 0.5 MG tablet  Commonly known as:  XANAX  Take 1 tablet (0.5 mg total) by mouth at bedtime  as needed.     aspirin 81 MG tablet  Take 81 mg by mouth daily.     atorvastatin 10 MG tablet  Commonly known as:  LIPITOR  Take 1 tablet (10 mg total) by mouth daily at 6 PM.     donepezil 10 MG tablet  Commonly known as:  ARICEPT  Take 10 mg by mouth at bedtime.     latanoprost 0.005 % ophthalmic solution  Commonly known as:  XALATAN  Place 1 drop into both eyes at bedtime.     levalbuterol 0.63 MG/3ML nebulizer solution  Commonly known as:  XOPENEX  Take 3 mLs (0.63 mg total) by nebulization every 6 (six) hours as needed for wheezing or shortness of breath.     loratadine 10 MG tablet  Commonly known as:  CLARITIN  Take 10 mg by mouth daily as needed for allergies.     rivaroxaban 20 MG Tabs tablet  Commonly known as:  XARELTO  Take 1 tablet (20 mg total) by mouth daily with supper.     senna-docusate 8.6-50 MG tablet  Commonly known as:  Senokot-S  Take 1 tablet by mouth 2 (two) times daily.     sertraline 50 MG tablet  Commonly known as:  ZOLOFT  Take 50 mg by mouth every morning.     tamsulosin 0.4 MG Caps capsule  Commonly known as:  FLOMAX  Take 1 capsule (0.4 mg total) by mouth daily.        No orders of the defined types were placed in this encounter.    Immunization History  Administered Date(s) Administered  . Influenza Whole 02/09/2010  . PPD Test 11/05/2011  . Pneumococcal Polysaccharide-23 02/19/2010    Social History  Substance Use Topics  . Smoking status: Current Some Day Smoker -- 0.50 packs/day for 53 years    Types: Cigarettes  . Smokeless tobacco: Never Used  . Alcohol Use: No    Family history is   Family History  Problem Relation Age of Onset  . Diabetes type II Mother   . CAD Father   . Diabetes type II Brother       Review of Systems  DATA OBTAINED: from nurse GENERAL:  no fevers, fatigue, appetite changes SKIN: No itching, rash or wounds EYES: No eye pain, redness, discharge EARS: No earache, tinnitus, change in  hearing NOSE: No congestion, drainage or bleeding  MOUTH/THROAT: No mouth or tooth pain, No sore throat RESPIRATORY: No cough, wheezing, SOB CARDIAC: No chest pain, palpitations, lower extremity edema  GI: No abdominal pain, No N/V/D or constipation, No heartburn or reflux  GU: No dysuria, frequency or urgency, or incontinence  MUSCULOSKELETAL: No unrelieved bone/joint pain NEUROLOGIC: No headache, dizziness or  focal weakness PSYCHIATRIC: No c/o anxiety or sadness   Filed Vitals:   09/13/15 0905  BP: 120/64  Pulse: 68  Temp: 98 F (36.7 C)  Resp: 18    SpO2 Readings from Last 1 Encounters:  09/30/15 99%        Physical Exam  GENERAL APPEARANCE: Alert, No acute distress.  SKIN: No diaphoresis rash HEAD: Normocephalic, atraumatic  EYES: Conjunctiva/lids clear. Pupils round, reactive. EOMs intact.  EARS: External exam WNL, canals clear. Hearing grossly normal.  NOSE: No deformity or discharge.  MOUTH/THROAT: Lips w/o lesions  RESPIRATORY: Breathing is even, unlabored. Lung sounds are clear   CARDIOVASCULAR: Heart RRR no murmurs, rubs or gallops. No peripheral edema.   GASTROINTESTINAL: Abdomen is soft, non-tender, not distended w/ normal bowel sounds. GENITOURINARY: Bladder non tender, not distended  MUSCULOSKELETAL: frail NEUROLOGIC:  Cranial nerves 2-12 grossly intact; L side weakness  PSYCHIATRIC: dememntia, no behavioral issues  Patient Active Problem List   Diagnosis Date Noted  . ICD (implantable cardioverter-defibrillator) in place 09/29/2015  . HCAP (healthcare-associated pneumonia) 09/29/2015  . Hypoxia 09/29/2015  . Vascular dementia without behavioral disturbance 09/25/2015  . Anemia 09/08/2015  . Fall 09/08/2015  . Depression 07/15/2015  . OA (osteoarthritis) 06/06/2015  . Bilateral lower extremity pain 05/14/2015  . BPH (benign prostatic hyperplasia) 04/10/2015  . Anxiety 04/10/2015  . FTT (failure to thrive) in adult 11/05/2014  . Mental status  change 10/30/2014  . Protein-calorie malnutrition, severe ([place]) 10/30/2014  . Increased urinary frequency 10/30/2014  . Faintness   . Generalized weakness 09/01/2014  . 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 Cardiac syncope   . Other specified hypotension   . Chronic systolic CHF (congestive heart failure) ([place])   . Late effects of CVA (cerebrovascular accident) 07/16/2014  . Bradycardia 06/01/2014  . Syncope 05/30/2014  . Stroke ([place])   . CVA (cerebral infarction)   . Hyperlipidemia   . Coronary artery disease involving native coronary artery of native heart without angina pectoris   . LVH (left ventricular hypertrophy)   . NSVT (nonsustained ventricular tachycardia) ([place]) 04/16/2014  . CKD (chronic kidney disease) stage 3, GFR 30-59 ml/min 04/16/2014  . Aphasia 04/15/2014  . Abnormal EKG   . Anemia, chronic disease 11/21/2013  . DM2 (diabetes mellitus, type 2) ([place]) 11/20/2013  . Hx pulmonary embolism 11/20/2013  . Leukocytosis 11/20/2013  . Hyperglycemia 08/22/2013  . Dehydration 08/21/2013  . ARF (acute renal failure) ([place]) 08/21/2013  . Abnormal ECG 08/21/2013  . Delirium 07/18/2013  . Small bowel obstruction ([place]) 07/13/2013  . Encounter for therapeutic drug monitoring 05/11/2013  . Crohn's disease ([place]) 02/16/2012  . Abnormal CT scan of lung 11/08/2011  . Bilateral leg and foot pain 11/06/2011  . HLD (hyperlipidemia) 03/28/2011  . Pulmonary embolism ([place]) 06/05/2010  . History of embolic stroke 48/88/9169  . DVT (deep venous thrombosis) ([place]) 06/05/2010  . EMPHYSEMATOUS BLEB 04/06/2010  . COPD (chronic obstructive pulmonary disease) ([place]) 04/05/2010  . Chronic systolic heart failure ([place]) 01/10/2010  . Memory loss 04/03/2009  . CHEST PAIN 03/08/2009  . Hypertensive heart disease with CHF (congestive heart failure) ([place]) 02/10/2009  . TOBACCO ABUSE 12/12/2008  . Dilated cardiomyopathy ([place]) 12/12/2008  . ABNORMAL ELECTROCARDIOGRAM 12/12/2008       Component  Value Date/Time   WBC 18.4* 09/30/2015 0512   RBC 4.10* 09/30/2015 0512   RBC 4.34 09/09/2015 1036   HGB 8.4* 09/30/2015 0512   HCT 29.6* 09/30/2015 0512   PLT 332 09/30/2015 4503  MCV 72.2* 09/30/2015 0512   LYMPHSABS 1.5 09/29/2015 1210   MONOABS 2.1* 09/29/2015 1210   EOSABS 0.0 09/29/2015 1210   BASOSABS 0.0 09/29/2015 1210        Component Value Date/Time   NA 135 09/30/2015 0512   K 4.0 09/30/2015 0512   CL 101 09/30/2015 0512   CO2 25 09/30/2015 0512   GLUCOSE 103* 09/30/2015 0512   BUN 20 09/30/2015 0512   CREATININE 1.22 09/30/2015 0512   CREATININE 1.34 12/13/2011 1722   CALCIUM 8.8* 09/30/2015 0512   PROT 6.3* 09/30/2015 0512   ALBUMIN 2.5* 09/30/2015 0512   AST 19 09/30/2015 0512   ALT 37 09/30/2015 0512   ALKPHOS 109 09/30/2015 0512   BILITOT 1.0 09/30/2015 0512   GFRNONAA 56* 09/30/2015 0512   GFRAA >60 09/30/2015 0512    Lab Results  Component Value Date   HGBA1C 6.8* 09/08/2015    Lab Results  Component Value Date   CHOL 149 04/16/2014   HDL 48 04/16/2014   LDLCALC 80 04/16/2014   TRIG 104 04/16/2014   CHOLHDL 3.1 04/16/2014     Dg Chest 2 View  09/08/2015  CLINICAL DATA:  Shortness of breath and weakness. EXAM: CHEST  2 VIEW COMPARISON:  12/21/2014 FINDINGS: Left-sided pacemaker unchanged. Lungs are hyperexpanded without focal consolidation or effusion. Cardiomediastinal silhouette is within normal. Remainder of the exam is unchanged. IMPRESSION: No acute cardiopulmonary disease. COPD. Electronically Signed   By: Marin Olp M.D.   On: 09/08/2015 11:20   Abd 1 View (kub)  09/08/2015  CLINICAL DATA:  75 year old male with diabetes, high blood pressure and hyperlipidemia presenting with generalized weakness and increased urinary frequency. On witnessed fall. Initial encounter. EXAM: ABDOMEN - 1 VIEW COMPARISON:  05/02/2014 and 07/16/2013. FINDINGS: No evidence of bowel obstruction. The possibility of free intraperitoneal air cannot be assessed on  a supine view. No acute fracture noted. Vascular calcifications. IMPRESSION: No evidence of bowel obstruction. No acute fracture noted. Electronically Signed   By: Genia Del M.D.   On: 09/08/2015 23:37   Ct Head Wo Contrast  09/08/2015  CLINICAL DATA:  Unwitnessed fall at nursing home. Initial encounter. EXAM: CT HEAD WITHOUT CONTRAST CT CERVICAL SPINE WITHOUT CONTRAST TECHNIQUE: Multidetector CT imaging of the head and cervical spine was performed following the standard protocol without intravenous contrast. Multiplanar CT image reconstructions of the cervical spine were also generated. COMPARISON:  Head CT 12/21/2014 FINDINGS: CT HEAD FINDINGS Skull and Sinuses:Negative for fracture or hemo sinus. Benign ground-glass density in the left occipital bone that is small and stable. Visualized orbits: Gaze to the right, nonspecific. No evidence of orbital injury. Brain: No evidence of acute infarction, hemorrhage, hydrocephalus, or mass lesion/mass effect. There is advanced chronic ischemic injury with numerous bilateral cerebellar infarcts, largest area in the inferior left cerebellum. Multiple bilateral subcortical infarcts, most discrete in the right thalamus and caudate head. Extensive fairly symmetric cortical infarct and atrophy, aligned in a watershed distribution. CT CERVICAL SPINE FINDINGS Negative for acute fracture or subluxation. No prevertebral edema. No gross cervical canal hematoma. Diffuse disc degeneration and spondylotic spurring. No indication of high-grade canal stenosis. IMPRESSION: 1. No evidence of acute intracranial or cervical spine injury. 2. Extensive chronic ischemic injury as described, stable from 12/21/2014 comparison. Electronically Signed   By: Monte Fantasia M.D.   On: 09/08/2015 11:04   Ct Cervical Spine Wo Contrast  09/08/2015  CLINICAL DATA:  Unwitnessed fall at nursing home. Initial encounter. EXAM: CT HEAD WITHOUT CONTRAST CT  CERVICAL SPINE WITHOUT CONTRAST TECHNIQUE:  Multidetector CT imaging of the head and cervical spine was performed following the standard protocol without intravenous contrast. Multiplanar CT image reconstructions of the cervical spine were also generated. COMPARISON:  Head CT 12/21/2014 FINDINGS: CT HEAD FINDINGS Skull and Sinuses:Negative for fracture or hemo sinus. Benign ground-glass density in the left occipital bone that is small and stable. Visualized orbits: Gaze to the right, nonspecific. No evidence of orbital injury. Brain: No evidence of acute infarction, hemorrhage, hydrocephalus, or mass lesion/mass effect. There is advanced chronic ischemic injury with numerous bilateral cerebellar infarcts, largest area in the inferior left cerebellum. Multiple bilateral subcortical infarcts, most discrete in the right thalamus and caudate head. Extensive fairly symmetric cortical infarct and atrophy, aligned in a watershed distribution. CT CERVICAL SPINE FINDINGS Negative for acute fracture or subluxation. No prevertebral edema. No gross cervical canal hematoma. Diffuse disc degeneration and spondylotic spurring. No indication of high-grade canal stenosis. IMPRESSION: 1. No evidence of acute intracranial or cervical spine injury. 2. Extensive chronic ischemic injury as described, stable from 12/21/2014 comparison. Electronically Signed   By: Monte Fantasia M.D.   On: 09/08/2015 11:04    Not all labs, radiology exams or other studies done during hospitalization come through on my EPIC note; however they are reviewed by me.    Assessment and Plan  Anemia Of unclear etiology on xarelto prior to admission:  - Hemoglobin down to 7.0 at the time of admission, baseline 8-9, received 2 units packed RBC transfusion, hemoglobin has remained stable at 8.7 -9.0 - Stool guaiac-negative 2 in the ED, with rectal exam and a large BM. Patient is followed by Dr.Buccini, colonoscopy in 2012 that did not show any colonic mass, has a history of Crohn's disease but  the colonoscopy did not show any evidence of Crohn's. C. difficile negative. Patient did not have any further stools during hospitalization or any GI bleeding noticed. Xarelto was restarted SNF - will f/u CBC   Advanced dementia - CT head showed no evidence of acute intracranial or cervical spine injury, extensive chronic ischemic changes  SNF - - Continue Aricept    Diabetes mellitus type 2 SNF -Hemoglobin A1c 6.8, continue sliding scale insulin   lactic acidosis - initially likely secondary to dehydration, resolved  Anxiety, failure to thrive SNF -continue prn Xanax at bedtime  Dilated cardiomyopathy with an EF of 20-25% based on 2-D echo in February 2016 History of coronary artery disease with prior PCI to LAD in 2015 - Cardiac enzymes negative so far  History of pulmonary embolism chronically on Xarelto, after failing warfarin due to difficulty maintaining a therapeutic INR - Patient also had history of embolic CVA in 9/76, DVT/PE chronically on xarelto  SNF - If patient has any GI bleed, will need to reconsider chronic anticoagulation, will defer to outpatient PCP and neurology (follows Dr. Erlinda Hong) ; will f/u CBC  [employer] AICD placed for primary prevention of SCD by Dr. Klein-interrogate because of recent falls  moderate protein calorie malnutrition    SNF - dietary supplements will be given and weight monitored       Time spent > 45 min;> 50% of time with patient was spent reviewing records, labs, tests and studies, counseling and developing plan of care  Webb Silversmith D. Sheppard Coil, MD

## 2015-09-15 LAB — LIPID PANEL
Cholesterol: 135 mg/dL (ref 0–200)
HDL: 81 mg/dL — AB (ref 35–70)
LDL Cholesterol: 37 mg/dL
Triglycerides: 82 mg/dL (ref 40–160)

## 2015-09-15 LAB — HEPATIC FUNCTION PANEL
ALT: 24 U/L (ref 10–40)
AST: 21 U/L (ref 14–40)
Alkaline Phosphatase: 102 U/L (ref 25–125)
Bilirubin, Total: 0.4 mg/dL

## 2015-09-15 LAB — TSH: TSH: 1.64 u[IU]/mL (ref 0.41–5.90)

## 2015-09-15 LAB — PSA: PSA: 3.6

## 2015-09-15 LAB — BASIC METABOLIC PANEL
BUN: 18 mg/dL (ref 4–21)
Creatinine: 1 mg/dL (ref 0.6–1.3)
Glucose: 133 mg/dL
Potassium: 4.6 mmol/L (ref 3.4–5.3)
Sodium: 140 mmol/L (ref 137–147)

## 2015-09-15 LAB — CBC AND DIFFERENTIAL
HEMATOCRIT: 36 % — AB (ref 41–53)
HEMOGLOBIN: 9.8 g/dL — AB (ref 13.5–17.5)
Platelets: 251 10*3/uL (ref 150–399)
WBC: 9.2 10^3/mL

## 2015-09-15 LAB — HEMOGLOBIN A1C: Hemoglobin A1C: 5.5

## 2015-09-25 DIAGNOSIS — F0150 Vascular dementia without behavioral disturbance: Secondary | ICD-10-CM | POA: Insufficient documentation

## 2015-09-29 ENCOUNTER — Encounter (HOSPITAL_COMMUNITY): Payer: Self-pay | Admitting: *Deleted

## 2015-09-29 ENCOUNTER — Emergency Department (HOSPITAL_COMMUNITY): Payer: Medicare Other

## 2015-09-29 ENCOUNTER — Inpatient Hospital Stay (HOSPITAL_COMMUNITY)
Admission: EM | Admit: 2015-09-29 | Discharge: 2015-10-04 | DRG: 190 | Disposition: A | Discharge status: Skilled Nursing Facility | Payer: Medicare Other | Attending: Internal Medicine | Admitting: Internal Medicine

## 2015-09-29 DIAGNOSIS — Z794 Long term (current) use of insulin: Secondary | ICD-10-CM | POA: Diagnosis not present

## 2015-09-29 DIAGNOSIS — R05 Cough: Secondary | ICD-10-CM | POA: Diagnosis present

## 2015-09-29 DIAGNOSIS — K5090 Crohn's disease, unspecified, without complications: Secondary | ICD-10-CM | POA: Diagnosis present

## 2015-09-29 DIAGNOSIS — R4701 Aphasia: Secondary | ICD-10-CM | POA: Diagnosis present

## 2015-09-29 DIAGNOSIS — Y95 Nosocomial condition: Secondary | ICD-10-CM | POA: Diagnosis present

## 2015-09-29 DIAGNOSIS — I130 Hypertensive heart and chronic kidney disease with heart failure and stage 1 through stage 4 chronic kidney disease, or unspecified chronic kidney disease: Secondary | ICD-10-CM | POA: Diagnosis present

## 2015-09-29 DIAGNOSIS — E119 Type 2 diabetes mellitus without complications: Secondary | ICD-10-CM

## 2015-09-29 DIAGNOSIS — E785 Hyperlipidemia, unspecified: Secondary | ICD-10-CM | POA: Diagnosis present

## 2015-09-29 DIAGNOSIS — Z95810 Presence of automatic (implantable) cardiac defibrillator: Secondary | ICD-10-CM

## 2015-09-29 DIAGNOSIS — D638 Anemia in other chronic diseases classified elsewhere: Secondary | ICD-10-CM | POA: Diagnosis present

## 2015-09-29 DIAGNOSIS — F0150 Vascular dementia without behavioral disturbance: Secondary | ICD-10-CM | POA: Diagnosis present

## 2015-09-29 DIAGNOSIS — N183 Chronic kidney disease, stage 3 unspecified: Secondary | ICD-10-CM | POA: Diagnosis present

## 2015-09-29 DIAGNOSIS — I420 Dilated cardiomyopathy: Secondary | ICD-10-CM | POA: Diagnosis present

## 2015-09-29 DIAGNOSIS — Z7982 Long term (current) use of aspirin: Secondary | ICD-10-CM

## 2015-09-29 DIAGNOSIS — E1165 Type 2 diabetes mellitus with hyperglycemia: Secondary | ICD-10-CM | POA: Diagnosis present

## 2015-09-29 DIAGNOSIS — Z87891 Personal history of nicotine dependence: Secondary | ICD-10-CM

## 2015-09-29 DIAGNOSIS — J189 Pneumonia, unspecified organism: Secondary | ICD-10-CM | POA: Diagnosis present

## 2015-09-29 DIAGNOSIS — Z8673 Personal history of transient ischemic attack (TIA), and cerebral infarction without residual deficits: Secondary | ICD-10-CM

## 2015-09-29 DIAGNOSIS — R0902 Hypoxemia: Secondary | ICD-10-CM | POA: Diagnosis present

## 2015-09-29 DIAGNOSIS — E43 Unspecified severe protein-calorie malnutrition: Secondary | ICD-10-CM | POA: Diagnosis present

## 2015-09-29 DIAGNOSIS — I5042 Chronic combined systolic (congestive) and diastolic (congestive) heart failure: Secondary | ICD-10-CM | POA: Diagnosis present

## 2015-09-29 DIAGNOSIS — N179 Acute kidney failure, unspecified: Secondary | ICD-10-CM | POA: Diagnosis present

## 2015-09-29 DIAGNOSIS — R627 Adult failure to thrive: Secondary | ICD-10-CM | POA: Diagnosis present

## 2015-09-29 DIAGNOSIS — Z8669 Personal history of other diseases of the nervous system and sense organs: Secondary | ICD-10-CM

## 2015-09-29 DIAGNOSIS — E1100 Type 2 diabetes mellitus with hyperosmolarity without nonketotic hyperglycemic-hyperosmolar coma (NKHHC): Secondary | ICD-10-CM | POA: Diagnosis not present

## 2015-09-29 DIAGNOSIS — I5022 Chronic systolic (congestive) heart failure: Secondary | ICD-10-CM | POA: Diagnosis present

## 2015-09-29 DIAGNOSIS — Z7901 Long term (current) use of anticoagulants: Secondary | ICD-10-CM

## 2015-09-29 DIAGNOSIS — E1122 Type 2 diabetes mellitus with diabetic chronic kidney disease: Secondary | ICD-10-CM | POA: Diagnosis present

## 2015-09-29 DIAGNOSIS — R739 Hyperglycemia, unspecified: Secondary | ICD-10-CM | POA: Diagnosis present

## 2015-09-29 DIAGNOSIS — N400 Enlarged prostate without lower urinary tract symptoms: Secondary | ICD-10-CM | POA: Diagnosis present

## 2015-09-29 DIAGNOSIS — I2510 Atherosclerotic heart disease of native coronary artery without angina pectoris: Secondary | ICD-10-CM | POA: Diagnosis present

## 2015-09-29 DIAGNOSIS — D72829 Elevated white blood cell count, unspecified: Secondary | ICD-10-CM | POA: Diagnosis not present

## 2015-09-29 DIAGNOSIS — R531 Weakness: Secondary | ICD-10-CM

## 2015-09-29 DIAGNOSIS — R55 Syncope and collapse: Secondary | ICD-10-CM | POA: Diagnosis present

## 2015-09-29 DIAGNOSIS — Z6821 Body mass index (BMI) 21.0-21.9, adult: Secondary | ICD-10-CM | POA: Diagnosis not present

## 2015-09-29 DIAGNOSIS — J440 Chronic obstructive pulmonary disease with acute lower respiratory infection: Principal | ICD-10-CM | POA: Diagnosis present

## 2015-09-29 DIAGNOSIS — Z86711 Personal history of pulmonary embolism: Secondary | ICD-10-CM

## 2015-09-29 DIAGNOSIS — J449 Chronic obstructive pulmonary disease, unspecified: Secondary | ICD-10-CM

## 2015-09-29 DIAGNOSIS — J181 Lobar pneumonia, unspecified organism: Secondary | ICD-10-CM

## 2015-09-29 DIAGNOSIS — F17200 Nicotine dependence, unspecified, uncomplicated: Secondary | ICD-10-CM

## 2015-09-29 DIAGNOSIS — I110 Hypertensive heart disease with heart failure: Secondary | ICD-10-CM

## 2015-09-29 DIAGNOSIS — I6990 Unspecified sequelae of unspecified cerebrovascular disease: Secondary | ICD-10-CM

## 2015-09-29 HISTORY — DX: Presence of automatic (implantable) cardiac defibrillator: Z95.810

## 2015-09-29 HISTORY — DX: Pneumonia, unspecified organism: J18.9

## 2015-09-29 LAB — I-STAT TROPONIN, ED: Troponin i, poc: 0.01 ng/mL (ref 0.00–0.08)

## 2015-09-29 LAB — BASIC METABOLIC PANEL WITH GFR
Anion gap: 8 (ref 5–15)
BUN: 23 mg/dL — ABNORMAL HIGH (ref 6–20)
CO2: 23 mmol/L (ref 22–32)
Calcium: 8.7 mg/dL — ABNORMAL LOW (ref 8.9–10.3)
Chloride: 105 mmol/L (ref 101–111)
Creatinine, Ser: 1.61 mg/dL — ABNORMAL HIGH (ref 0.61–1.24)
GFR calc Af Amer: 46 mL/min — ABNORMAL LOW (ref >60)
GFR calc non Af Amer: 40 mL/min — ABNORMAL LOW (ref >60)
Glucose, Bld: 126 mg/dL — ABNORMAL HIGH (ref 65–99)
Potassium: 4 mmol/L (ref 3.5–5.1)
Sodium: 136 mmol/L (ref 135–145)

## 2015-09-29 LAB — DIFFERENTIAL
Basophils Absolute: 0 K/uL (ref 0.0–0.1)
Basophils Relative: 0 %
Eosinophils Absolute: 0 K/uL (ref 0.0–0.7)
Eosinophils Relative: 0 %
Lymphocytes Relative: 8 %
Lymphs Abs: 1.5 K/uL (ref 0.7–4.0)
Monocytes Absolute: 2.1 K/uL — ABNORMAL HIGH (ref 0.1–1.0)
Monocytes Relative: 11 %
Neutro Abs: 15.6 K/uL — ABNORMAL HIGH (ref 1.7–7.7)
Neutrophils Relative %: 81 %

## 2015-09-29 LAB — URINALYSIS, ROUTINE W REFLEX MICROSCOPIC
Bilirubin Urine: NEGATIVE
GLUCOSE, UA: NEGATIVE mg/dL
Hgb urine dipstick: NEGATIVE
KETONES UR: NEGATIVE mg/dL
Nitrite: NEGATIVE
PH: 6 (ref 5.0–8.0)
Protein, ur: NEGATIVE mg/dL
SPECIFIC GRAVITY, URINE: 1.019 (ref 1.005–1.030)

## 2015-09-29 LAB — CBC
HEMATOCRIT: 32.1 % — AB (ref 39.0–52.0)
Hemoglobin: 9.2 g/dL — ABNORMAL LOW (ref 13.0–17.0)
MCH: 21.1 pg — ABNORMAL LOW (ref 26.0–34.0)
MCHC: 28.7 g/dL — AB (ref 30.0–36.0)
MCV: 73.6 fL — AB (ref 78.0–100.0)
Platelets: 309 10*3/uL (ref 150–400)
RBC: 4.36 MIL/uL (ref 4.22–5.81)
RDW: 20.9 % — AB (ref 11.5–15.5)
WBC: 19.2 10*3/uL — AB (ref 4.0–10.5)

## 2015-09-29 LAB — LACTIC ACID, PLASMA: LACTIC ACID, VENOUS: 1.7 mmol/L (ref 0.5–2.0)

## 2015-09-29 LAB — URINE MICROSCOPIC-ADD ON
Bacteria, UA: NONE SEEN
RBC / HPF: NONE SEEN RBC/hpf (ref 0–5)

## 2015-09-29 LAB — CBG MONITORING, ED: Glucose-Capillary: 116 mg/dL — ABNORMAL HIGH (ref 65–99)

## 2015-09-29 LAB — BRAIN NATRIURETIC PEPTIDE: B NATRIURETIC PEPTIDE 5: 88.7 pg/mL (ref 0.0–100.0)

## 2015-09-29 LAB — GLUCOSE, CAPILLARY: Glucose-Capillary: 128 mg/dL — ABNORMAL HIGH (ref 65–99)

## 2015-09-29 MED ORDER — SODIUM CHLORIDE 0.9 % IV SOLN: 250.0000 mL | INTRAVENOUS | Status: DC | PRN

## 2015-09-29 MED ORDER — ACETAMINOPHEN 325 MG PO TABS
650.0000 mg | ORAL_TABLET | Freq: Four times a day (QID) | ORAL | Status: DC | PRN
  Administered 2015-10-01 – 2015-10-03 (×3): 650 mg via ORAL
  Filled 2015-09-29 (×2): qty 2

## 2015-09-29 MED ORDER — VANCOMYCIN HCL IN DEXTROSE 1-5 GM/200ML-% IV SOLN
1000.0000 mg | Freq: Once | INTRAVENOUS | Status: AC
  Administered 2015-09-29: 1000 mg via INTRAVENOUS
  Filled 2015-09-29: qty 200

## 2015-09-29 MED ORDER — RIVAROXABAN 20 MG PO TABS
20.0000 mg | ORAL_TABLET | Freq: Every day | ORAL | Status: DC
  Administered 2015-09-29 – 2015-10-03 (×5): 20 mg via ORAL
  Filled 2015-09-29 (×5): qty 1

## 2015-09-29 MED ORDER — INSULIN ASPART 100 UNIT/ML ~~LOC~~ SOLN
0.0000 [IU] | Freq: Three times a day (TID) | SUBCUTANEOUS | Status: DC
  Administered 2015-09-30 (×2): 2 [IU] via SUBCUTANEOUS
  Administered 2015-10-01: 3 [IU] via SUBCUTANEOUS
  Administered 2015-10-01: 2 [IU] via SUBCUTANEOUS
  Administered 2015-10-02: 3 [IU] via SUBCUTANEOUS
  Administered 2015-10-03: 2 [IU] via SUBCUTANEOUS
  Administered 2015-10-03: 5 [IU] via SUBCUTANEOUS
  Administered 2015-10-03: 1 [IU] via SUBCUTANEOUS
  Administered 2015-10-04: 2 [IU] via SUBCUTANEOUS
  Administered 2015-10-04: 1 [IU] via SUBCUTANEOUS

## 2015-09-29 MED ORDER — ATORVASTATIN CALCIUM 10 MG PO TABS
10.0000 mg | ORAL_TABLET | Freq: Every day | ORAL | Status: DC
  Administered 2015-09-30 – 2015-10-03 (×4): 10 mg via ORAL
  Filled 2015-09-29 (×4): qty 1

## 2015-09-29 MED ORDER — VANCOMYCIN HCL IN DEXTROSE 750-5 MG/150ML-% IV SOLN
750.0000 mg | INTRAVENOUS | Status: DC
  Administered 2015-09-30 – 2015-10-02 (×3): 750 mg via INTRAVENOUS
  Filled 2015-09-29 (×4): qty 150

## 2015-09-29 MED ORDER — ASPIRIN EC 81 MG PO TBEC
81.0000 mg | DELAYED_RELEASE_TABLET | Freq: Every day | ORAL | Status: DC
  Administered 2015-09-29 – 2015-10-04 (×6): 81 mg via ORAL
  Filled 2015-09-29 (×6): qty 1

## 2015-09-29 MED ORDER — SODIUM CHLORIDE 0.9 % IV SOLN
Freq: Once | INTRAVENOUS | Status: AC
Start: 2015-09-29 — End: 2015-09-29
  Administered 2015-09-29: 17:00:00 via INTRAVENOUS

## 2015-09-29 MED ORDER — ALBUTEROL SULFATE (2.5 MG/3ML) 0.083% IN NEBU
2.5000 mg | INHALATION_SOLUTION | Freq: Four times a day (QID) | RESPIRATORY_TRACT | Status: DC
  Administered 2015-09-29 – 2015-09-30 (×2): 2.5 mg via RESPIRATORY_TRACT
  Filled 2015-09-29 (×3): qty 3

## 2015-09-29 MED ORDER — IPRATROPIUM BROMIDE 0.02 % IN SOLN
0.5000 mg | Freq: Four times a day (QID) | RESPIRATORY_TRACT | Status: DC
  Administered 2015-09-29 – 2015-09-30 (×2): 0.5 mg via RESPIRATORY_TRACT
  Filled 2015-09-29 (×3): qty 2.5

## 2015-09-29 MED ORDER — TAMSULOSIN HCL 0.4 MG PO CAPS
0.4000 mg | ORAL_CAPSULE | Freq: Every day | ORAL | Status: DC
  Administered 2015-09-30 – 2015-10-04 (×5): 0.4 mg via ORAL
  Filled 2015-09-29 (×5): qty 1

## 2015-09-29 MED ORDER — SODIUM CHLORIDE 0.9% FLUSH: 3.0000 mL | INTRAVENOUS | Status: DC | PRN

## 2015-09-29 MED ORDER — PIPERACILLIN-TAZOBACTAM 3.375 G IVPB
3.3750 g | Freq: Three times a day (TID) | INTRAVENOUS | Status: DC
  Administered 2015-09-29 – 2015-10-04 (×14): 3.375 g via INTRAVENOUS
  Filled 2015-09-29 (×19): qty 50

## 2015-09-29 MED ORDER — SERTRALINE HCL 50 MG PO TABS
50.0000 mg | ORAL_TABLET | ORAL | Status: DC
  Administered 2015-09-30 – 2015-10-04 (×5): 50 mg via ORAL
  Filled 2015-09-29 (×5): qty 1

## 2015-09-29 MED ORDER — DONEPEZIL HCL 10 MG PO TABS
10.0000 mg | ORAL_TABLET | Freq: Every day | ORAL | Status: DC
  Administered 2015-09-29 – 2015-10-03 (×5): 10 mg via ORAL
  Filled 2015-09-29 (×5): qty 1

## 2015-09-29 MED ORDER — LATANOPROST 0.005 % OP SOLN
1.0000 [drp] | Freq: Every day | OPHTHALMIC | Status: DC
  Administered 2015-09-29 – 2015-10-03 (×5): 1 [drp] via OPHTHALMIC
  Filled 2015-09-29 (×2): qty 2.5

## 2015-09-29 MED ORDER — SENNA 8.6 MG PO TABS
1.0000 | ORAL_TABLET | Freq: Every morning | ORAL | Status: DC
  Administered 2015-09-30 – 2015-10-04 (×5): 8.6 mg via ORAL
  Filled 2015-09-29 (×5): qty 1

## 2015-09-29 MED ORDER — ALPRAZOLAM 0.5 MG PO TABS: 0.5000 mg | ORAL_TABLET | Freq: Every evening | ORAL | Status: DC | PRN

## 2015-09-29 MED ORDER — PIPERACILLIN-TAZOBACTAM 3.375 G IVPB 30 MIN
3.3750 g | Freq: Once | INTRAVENOUS | Status: AC
Start: 2015-09-29 — End: 2015-09-29
  Administered 2015-09-29: 3.375 g via INTRAVENOUS
  Filled 2015-09-29: qty 50

## 2015-09-29 MED ORDER — SODIUM CHLORIDE 0.9% FLUSH
3.0000 mL | Freq: Two times a day (BID) | INTRAVENOUS | Status: DC
  Administered 2015-10-01 – 2015-10-03 (×3): 3 mL via INTRAVENOUS

## 2015-09-29 MED ORDER — ACETAMINOPHEN 650 MG RE SUPP: 650.0000 mg | Freq: Four times a day (QID) | RECTAL | Status: DC | PRN

## 2015-09-29 NOTE — ED Provider Notes (Signed)
CSN: [id number]     Arrival date & time 09/29/15  1159 History   First MD Initiated Contact with Patient 09/29/15 1201     Chief Complaint  Patient presents with  . Loss of Consciousness     (Consider location/radiation/quality/duration/timing/severity/associated sxs/prior Treatment) HPI  Level V caveat due to dementia.  75 year old male who presents with loss of consciousness. History of dilated cardiomyopathy with AICD, coronary artery disease, PE/DVT, and dementia. History is provided by EMS who states that patient had episode of "unresponsiveness" where he had rightward gaze and was not responsive to staff. He subsequently returned to normal but was complaining of generalized weakness. Patient does not remember any of the events but states that he has been having a "terrible cough". On arrival was noted by EMS to be hypoxic to 80% requiring supplemental oxygen. Denies shortness of breath or chest pain. Past Medical History  Diagnosis Date  . Hypertension   . Dilated cardiomyopathy ([place])     2/12: EF 30-35%, trivial AI, mild RAE.  EF 2014 40 -45%  . CAD (coronary artery disease)     LHC 9/05 with Dr. Einar Gip:  dLM 20-30%, LAD 85%, oD1 20-30%.  PCI:  Taxus DES to LAD; Dx jailed and tx with POBA.  Last myoview 12/10: inf scar, no ischemia, EF 29%.  . DVT (deep venous thrombosis) ([place])   . Pulmonary embolus ([facility])     chronic coumadin  . 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 PFO (patent foramen ovale)     Not mentioned on 2014 echo.  [name] HLD (hyperlipidemia)   . Crohn's disease ([place])   . Nephrolithiasis   . Diabetes mellitus     11/05/11 "borderline; don't take medications"  . Stroke [facility]) 1993    "left arm can't hold steady; leg too"  . Arthritis     "used to have a touch in my legs"  . Memory difficulties   . Pneumonia ~ 2011    09-22-13 denies any recent SOB or breathing problems  . Swelling of both ankles      09-22-13 occ.feet, but denies pain.  . Bradycardia 06/01/2014  . Dementia   .  Hyperlipidemia   . CHF (congestive heart failure) [facility])    Past Surgical History  Procedure Laterality Date  . Colon surgery  1994; 1996    "for Crohn's disease"  . Appendectomy    . Implantable cardioverter defibrillator implant N/A 06/02/2014    Procedure: IMPLANTABLE CARDIOVERTER DEFIBRILLATOR IMPLANT;  Surgeon: [name], MD;  Location: [facility] CATH LAB;  Service: Cardiovascular;  Laterality: N/A;   Family History  Problem Relation Age of Onset  . Diabetes type II Mother   . CAD Father   . Diabetes type II Brother    Social History  Substance Use Topics  . Smoking status: Current Some Day Smoker -- 0.50 packs/day for 53 years    Types: Cigarettes  . Smokeless tobacco: Never Used  . Alcohol Use: No    Review of Systems  Unable to perform ROS: Dementia      Allergies  Review of patient's allergies indicates no known allergies.  Home Medications   Prior to Admission medications   Medication Sig Start Date End Date Taking? Authorizing Provider  acetaminophen (TYLENOL) 500 MG tablet Take 2 tablets (1,000 mg total) by mouth 2 (two) times daily. 05/14/15   Gerlene Fee, NP  ALPRAZolam Duanne Moron) 0.5 MG tablet Take 1 tablet (0.5 mg total) by mouth at bedtime as needed. 09/11/15  Ripudeep Krystal Eaton, MD  aspirin 81 MG tablet Take 81 mg by mouth daily.    Historical Provider, MD  atorvastatin (LIPITOR) 10 MG tablet Take 1 tablet (10 mg total) by mouth daily at 6 PM. 04/18/14   Maryann Mikhail, DO  donepezil (ARICEPT) 10 MG tablet Take 10 mg by mouth at bedtime.  02/23/15   Historical Provider, MD  latanoprost (XALATAN) 0.005 % ophthalmic solution Place 1 drop into both eyes at bedtime. 06/29/13   Historical Provider, MD  levalbuterol Penne Lash) 0.63 MG/3ML nebulizer solution Take 3 mLs (0.63 mg total) by nebulization every 6 (six) hours as needed for wheezing or shortness of breath. 09/11/15   Ripudeep Krystal Eaton, MD  loratadine (CLARITIN) 10 MG tablet Take 10 mg by mouth daily as needed for  allergies.    Historical Provider, MD  rivaroxaban (XARELTO) 20 MG TABS tablet Take 1 tablet (20 mg total) by mouth daily with supper. 06/23/14   Hosie Poisson, MD  senna-docusate (SENOKOT-S) 8.6-50 MG tablet Take 1 tablet by mouth 2 (two) times daily. 09/11/15   Ripudeep Krystal Eaton, MD  sertraline (ZOLOFT) 50 MG tablet Take 50 mg by mouth every morning.  05/18/14   Historical Provider, MD  tamsulosin (FLOMAX) 0.4 MG CAPS capsule Take 1 capsule (0.4 mg total) by mouth daily. 04/18/14   Maryann Mikhail, DO   BP 113/62 mmHg  Pulse 58  Temp(Src) 98.1 F (36.7 C) (Oral)  Resp 17  SpO2 99% Physical Exam Physical Exam  Nursing note and vitals reviewed. Constitutional: elderly man, non-toxic, and in no acute distress Head: Normocephalic and atraumatic.  Mouth/Throat: Oropharynx is clear. Dry mucous membranes.  Neck: Normal range of motion. Neck supple.  Cardiovascular: Normal rate and regular rhythm.  No edema. Pulmonary/Chest: Effort normal and breath sounds normal. bibasilar rhonchi. Abdominal: Soft. There is no tenderness. There is no rebound and no guarding.  Musculoskeletal: Normal range of motion.  Neurological: Alert, no facial droop, fluent speech, moves all extremities symmetrically Skin: Skin is warm and dry.  Psychiatric: Cooperative  ED Course  Procedures (including critical care time) Labs Review Labs Reviewed  BASIC METABOLIC PANEL - Abnormal; Notable for the following:    Glucose, Bld 126 (*)    BUN 23 (*)    Creatinine, Ser 1.61 (*)    Calcium 8.7 (*)    GFR calc non Af Amer 40 (*)    GFR calc Af Amer 46 (*)    All other components within normal limits  CBC - Abnormal; Notable for the following:    WBC 19.2 (*)    Hemoglobin 9.2 (*)    HCT 32.1 (*)    MCV 73.6 (*)    MCH 21.1 (*)    MCHC 28.7 (*)    RDW 20.9 (*)    All other components within normal limits  URINALYSIS, ROUTINE W REFLEX MICROSCOPIC (NOT AT [facility]) - Abnormal; Notable for the following:    Leukocytes, UA  SMALL (*)    All other components within normal limits  DIFFERENTIAL - Abnormal; Notable for the following:    Neutro Abs 15.6 (*)    Monocytes Absolute 2.1 (*)    All other components within normal limits  URINE MICROSCOPIC-ADD ON - Abnormal; Notable for the following:    Squamous Epithelial / LPF 0-5 (*)    Casts HYALINE CASTS (*)    All other components within normal limits  CBG MONITORING, ED - Abnormal; Notable for the following:    Glucose-Capillary 116 (*)  All other components within normal limits  BRAIN NATRIURETIC PEPTIDE  I-STAT TROPOININ, ED    Imaging Review Dg Chest 2 View  09/29/2015  CLINICAL DATA:  Hypoxia EXAM: CHEST  2 VIEW COMPARISON:  09/08/2015 FINDINGS: Cardiac shadow is stable. Defibrillator is again noted. Lungs are well aerated bilaterally. No focal infiltrate or effusion is noted. No bony abnormality is seen. IMPRESSION: No active cardiopulmonary disease. Electronically Signed   By: Inez Catalina M.D.   On: 09/29/2015 13:49   Ct Head Wo Contrast  09/29/2015  CLINICAL DATA:  Possible seizure today. The patient was unresponsive for 30 seconds. Initial encounter. EXAM: CT HEAD WITHOUT CONTRAST TECHNIQUE: Contiguous axial images were obtained from the base of the skull through the vertex without intravenous contrast. COMPARISON:  Head CT scan 09/08/2015 and 12/21/2014. FINDINGS: Atrophy and extensive chronic microvascular ischemic change are again seen. Bilateral cerebellar infarcts, most conspicuous in the left cerebellar hemisphere and bilateral subcortical infarcts best seen in the right caudate and thalamus are again identified. Anterior and posterior watershed territory infarcts bilaterally are also again identified. No evidence of acute abnormality including hemorrhage, infarct, mass lesion, mass effect, midline shift or abnormal extra-axial fluid collection. No hydrocephalus or pneumocephalus. The calvarium is intact. Imaged paranasal sinuses mastoid air cells are  clear. IMPRESSION: No acute abnormality. Extensive atrophy and multiple remote infarctions as seen on prior exams. Electronically Signed   By: Inge Rise M.D.   On: 09/29/2015 13:14   Ct Chest Wo Contrast  09/29/2015  CLINICAL DATA:  75 year old male with a cough for the past 2 days. History of CHF. EXAM: CT CHEST WITHOUT CONTRAST TECHNIQUE: Multidetector CT imaging of the chest was performed following the standard protocol without IV contrast. COMPARISON:  Chest x-ray obtained earlier today ; prior chest CT 04/18/2014 FINDINGS: Mediastinum: Unremarkable CT appearance of the thyroid gland. No suspicious mediastinal or hilar adenopathy. No soft tissue mediastinal mass. The thoracic esophagus is unremarkable. Heart/Vascular: Limited evaluation in the absence of intravenous contrast. Left subclavian approach cardiac rhythm maintenance device with leads terminating in the right atrium and right ventricular apex. The heart is normal in size. No pericardial effusion. Atherosclerotic calcifications present throughout the coronary arteries. No pericardial effusion. Lungs/Pleura: Stable 2 cm thin walled cystic structure with adjacent pleural parenchymal scarring in the right upper lobe. Mild background centrilobular emphysema. Interval development of patchy ground-glass attenuation airspace opacities in a peribronchovascular distribution in the right greater than left lower lobes. Findings superimposed on a background of chronic scarring. Bones/Soft Tissues: No acute fracture or aggressive appearing lytic or blastic osseous lesion. Upper Abdomen: Multiple hypoechoic lesions throughout both kidneys which are incompletely characterized in the absence of intravenous contrast material. However, prior imaging demonstrated multiple simple cysts. No nephrolithiasis visualized. Upper abdominal organs are unremarkable. IMPRESSION: 1. Findings most consistent with an infectious/ inflammatory process involving the bilateral  lower lobes. Given the distribution, aspiration is a consideration as is multilobar pneumonia. 2. Coronary artery calcifications. 3. Bilateral renal cysts. Electronically Signed   By: Jacqulynn Cadet M.D.   On: 09/29/2015 16:00   I have personally reviewed and evaluated these images and lab results as part of my medical decision-making.   EKG Interpretation   Date/Time:  Friday September 29 2015 12:00:28 EDT Ventricular Rate:  69 PR Interval:  143 QRS Duration: 87 QT Interval:  414 QTC Calculation: 443 R Axis:   70 Text Interpretation:  Atrial-paced complexes ST elevation, consider  anterior injury similar to prior EKG  Confirmed by Magdalene Tardiff  MD, Hinton Dyer [phone]) on  09/29/2015 12:02:31 PM Also confirmed by Oleta Mouse MD, Ger Ringenberg [phone]), editor Stout  CT, Leda Gauze [phone])  on 09/29/2015 12:17:50 PM      MDM   Final diagnoses:  HCAP (healthcare-associated pneumonia)  Lobar pneumonia ([place])   Presenting with ? Of syncopal versus seizure episode; although history is not convincing for either. Initially hypoxic, but subsequently with normal oxygenation on room air. With rhonchi at bases of bilateral lungs. CXR without infiltrate but clinical pneumonia possible given leukocytosis of 19. With mild AKI as well. CT chest suggestive of multifocal pna bibasilarly. Treated for HCAP with vancomycin and zosyn. Requested AICD interrogation, and pending for ? Of syncope. EKG without stigmata of arrhythmia or acute ischemic changes. Discussed with hospitalist who will admit to med surg for ongoing treatment.   Forde Dandy, MD 09/29/15 [phone]

## 2015-09-29 NOTE — ED Notes (Signed)
Patient transported to CT 

## 2015-09-29 NOTE — ED Notes (Signed)
MD at bedside. 

## 2015-09-29 NOTE — H&P (Addendum)
Admission History and Physical  Grant Lara MEB:[id number] DOB: [birth year] DOA: 09/29/2015  Referring physician: Oleta Mouse, MD PCP: [name], MD   Chief Complaint: almost passed out  Historian: ED physician, records, Pt unable to participate  HPI: Grant Lara is a 75 y.o. male who was recently discharged presents via [facility] from [facility] for possible syncopal episode vs seizure. Pt was at PT when he had a 30 second episode of "unresponsiveness" with a right sided gaze. Pt was sitting, no fall or head injury. After episode pt had generalized bilateral weakness. No history of seizure known. Pt responsive to verbal stimuli. GCS reported 13. Follows commands. EMS reported rhonchi in lower bases with nonproductive cough. Oxygen sat in  80s RA on arrival.  He has a PMH of dementia, hypertension, hyperlipidemia, diabetes mellitus, depression, CAD, DVT, PE on Xarelto, PFO, chronic disease, stroke, BPH, systolic congestive heart failure (EF 20-25%), who presents with generalized weakness and increased cough, chest congestion and shortness of breath.The patient was evaluated in the ED and found to have an acute leukocytosis, hypoxia and abnormal lung sounds.  A CT of the chest was obtained that was significant for multifocal bilateral pneumonia.  Given that patient lives in a nursing facility and recently discharged from the hospital he is being admitted for presumed healthcare-associated pneumonia.  He denies chest pain.  He is a heavy smoker for over 25 years and has COPD.   Review of Systems: Unable to obtained as patient has severe dementia.   Past Medical History  Diagnosis Date  . Hypertension   . Dilated cardiomyopathy ([place])     2/12: EF 30-35%, trivial AI, mild RAE.  EF 2014 40 -45%  . CAD (coronary artery disease)     LHC 9/05 with Dr. Einar Gip:  dLM 20-30%, LAD 85%, oD1 20-30%.  PCI:  Taxus DES to LAD; Dx jailed and tx with POBA.  Last myoview 12/10: inf scar,  no ischemia, EF 29%.  . DVT (deep venous thrombosis) ([place])   . Pulmonary embolus (HCC)     chronic coumadin  . 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 PFO (patent foramen ovale)     Not mentioned on 2014 echo.  [name] HLD (hyperlipidemia)   . Crohn's disease ([place])   . Nephrolithiasis   . Diabetes mellitus     11/05/11 "borderline; don't take medications"  . Stroke [facility]) 1993    "left arm can't hold steady; leg too"  . Arthritis     "used to have a touch in my legs"  . Memory difficulties   . Pneumonia ~ 2011    09-22-13 denies any recent SOB or breathing problems  . Swelling of both ankles      09-22-13 occ.feet, but denies pain.  . Bradycardia 06/01/2014  . Dementia   . Hyperlipidemia   . CHF (congestive heart failure) [facility])    Past Surgical History  Procedure Laterality Date  . Colon surgery  1994; 1996    "for Crohn's disease"  . Appendectomy    . Implantable cardioverter defibrillator implant N/A 06/02/2014    Procedure: IMPLANTABLE CARDIOVERTER DEFIBRILLATOR IMPLANT;  Surgeon: [name], MD;  Location: [facility] CATH LAB;  Service: Cardiovascular;  Laterality: N/A;   Social History:  Pt quit tobacco Dec 2016 per wife.  He has a 26.5 pack-year smoking history. He has never used smokeless tobacco. He reports that he does not drink alcohol or use illicit drugs.   No Known Allergies  Family History  Problem Relation Age of Onset  . Diabetes type II Mother   . CAD Father   . Diabetes type II Brother       Prior to Admission medications   Medication Sig Start Date End Date Taking? Authorizing Provider  acetaminophen (TYLENOL) 500 MG tablet Take 2 tablets (1,000 mg total) by mouth 2 (two) times daily. 05/14/15   Gerlene Fee, NP  ALPRAZolam Duanne Moron) 0.5 MG tablet Take 1 tablet (0.5 mg total) by mouth at bedtime as needed. 09/11/15   Ripudeep Krystal Eaton, MD  aspirin 81 MG tablet Take 81 mg by mouth daily.    Historical Provider, MD  atorvastatin (LIPITOR) 10 MG tablet Take 1 tablet (10 mg total)  by mouth daily at 6 PM. 04/18/14   Maryann Mikhail, DO  donepezil (ARICEPT) 10 MG tablet Take 10 mg by mouth at bedtime.  02/23/15   Historical Provider, MD  latanoprost (XALATAN) 0.005 % ophthalmic solution Place 1 drop into both eyes at bedtime. 06/29/13   Historical Provider, MD  levalbuterol Penne Lash) 0.63 MG/3ML nebulizer solution Take 3 mLs (0.63 mg total) by nebulization every 6 (six) hours as needed for wheezing or shortness of breath. 09/11/15   Ripudeep Krystal Eaton, MD  loratadine (CLARITIN) 10 MG tablet Take 10 mg by mouth daily as needed for allergies.    Historical Provider, MD  rivaroxaban (XARELTO) 20 MG TABS tablet Take 1 tablet (20 mg total) by mouth daily with supper. 06/23/14   Hosie Poisson, MD  senna-docusate (SENOKOT-S) 8.6-50 MG tablet Take 1 tablet by mouth 2 (two) times daily. 09/11/15   Ripudeep Krystal Eaton, MD  sertraline (ZOLOFT) 50 MG tablet Take 50 mg by mouth every morning.  05/18/14   Historical Provider, MD  tamsulosin (FLOMAX) 0.4 MG CAPS capsule Take 1 capsule (0.4 mg total) by mouth daily. 04/18/14   Cristal Ford, DO   Physical Exam: Filed Vitals:   09/29/15 1400 09/29/15 1430 09/29/15 1500 09/29/15 1530  BP: 114/67 109/67 107/65 113/62  Pulse: 63 60 58 58  Temp:      TempSrc:      Resp: 19 16 12 17   SpO2: 100% 98% 98% 99%     General exam: elderly male patient, lying comfortably supine on the gurney in no obvious distress.  Head, eyes and ENT: Nontraumatic and normocephalic. Pupils equally reacting to light and accommodation. Oral mucosa dry.  Neck: Supple. No JVD, carotid bruit or thyromegaly.  Lymphatics: No lymphadenopathy.  Respiratory system: bibasilar exp wheeze and rhonchi,  No increased work of breathing.  Cardiovascular system: S1 and S2 heard, No JVD, murmurs, gallops, clicks or pedal edema.  Gastrointestinal system: Abdomen is nondistended, soft and mild nonspecific tenderness, no guarding, no rebound tenderness. Normal bowel sounds heard. No organomegaly  or masses appreciated.  Central nervous system: Alert and oriented. No focal neurological deficits.  Extremities:  Peripheral pulses symmetrically felt.   Skin: No rashes or acute findings.  Musculoskeletal system: Negative exam.  Psychiatry:  demented.   Labs on Admission:  Basic Metabolic Panel:  Recent Labs Lab 09/29/15 1210  NA 136  K 4.0  CL 105  CO2 23  GLUCOSE 126*  BUN 23*  CREATININE 1.61*  CALCIUM 8.7*   Liver Function Tests: No results for input(s): AST, ALT, ALKPHOS, BILITOT, PROT, ALBUMIN in the last 168 hours. No results for input(s): LIPASE, AMYLASE in the last 168 hours. No results for input(s): AMMONIA in the last 168 hours. CBC:  Recent Labs Lab 09/29/15 1210  WBC 19.2*  NEUTROABS 15.6*  HGB 9.2*  HCT 32.1*  MCV 73.6*  PLT 309   Cardiac Enzymes: No results for input(s): CKTOTAL, CKMB, CKMBINDEX, TROPONINI in the last 168 hours.  BNP (last 3 results) No results for input(s): PROBNP in the last 8760 hours. CBG:  Recent Labs Lab 09/29/15 1209  GLUCAP 116*    Radiological Exams on Admission: Dg Chest 2 View  09/29/2015  CLINICAL DATA:  Hypoxia EXAM: CHEST  2 VIEW COMPARISON:  09/08/2015 FINDINGS: Cardiac shadow is stable. Defibrillator is again noted. Lungs are well aerated bilaterally. No focal infiltrate or effusion is noted. No bony abnormality is seen. IMPRESSION: No active cardiopulmonary disease. Electronically Signed   By: Inez Catalina M.D.   On: 09/29/2015 13:49   Ct Head Wo Contrast  09/29/2015  CLINICAL DATA:  Possible seizure today. The patient was unresponsive for 30 seconds. Initial encounter. EXAM: CT HEAD WITHOUT CONTRAST TECHNIQUE: Contiguous axial images were obtained from the base of the skull through the vertex without intravenous contrast. COMPARISON:  Head CT scan 09/08/2015 and 12/21/2014. FINDINGS: Atrophy and extensive chronic microvascular ischemic change are again seen. Bilateral cerebellar infarcts, most conspicuous  in the left cerebellar hemisphere and bilateral subcortical infarcts best seen in the right caudate and thalamus are again identified. Anterior and posterior watershed territory infarcts bilaterally are also again identified. No evidence of acute abnormality including hemorrhage, infarct, mass lesion, mass effect, midline shift or abnormal extra-axial fluid collection. No hydrocephalus or pneumocephalus. The calvarium is intact. Imaged paranasal sinuses mastoid air cells are clear. IMPRESSION: No acute abnormality. Extensive atrophy and multiple remote infarctions as seen on prior exams. Electronically Signed   By: Inge Rise M.D.   On: 09/29/2015 13:14   Ct Chest Wo Contrast  09/29/2015  CLINICAL DATA:  75 year old male with a cough for the past 2 days. History of CHF. EXAM: CT CHEST WITHOUT CONTRAST TECHNIQUE: Multidetector CT imaging of the chest was performed following the standard protocol without IV contrast. COMPARISON:  Chest x-ray obtained earlier today ; prior chest CT 04/18/2014 FINDINGS: Mediastinum: Unremarkable CT appearance of the thyroid gland. No suspicious mediastinal or hilar adenopathy. No soft tissue mediastinal mass. The thoracic esophagus is unremarkable. Heart/Vascular: Limited evaluation in the absence of intravenous contrast. Left subclavian approach cardiac rhythm maintenance device with leads terminating in the right atrium and right ventricular apex. The heart is normal in size. No pericardial effusion. Atherosclerotic calcifications present throughout the coronary arteries. No pericardial effusion. Lungs/Pleura: Stable 2 cm thin walled cystic structure with adjacent pleural parenchymal scarring in the right upper lobe. Mild background centrilobular emphysema. Interval development of patchy ground-glass attenuation airspace opacities in a peribronchovascular distribution in the right greater than left lower lobes. Findings superimposed on a background of chronic scarring.  Bones/Soft Tissues: No acute fracture or aggressive appearing lytic or blastic osseous lesion. Upper Abdomen: Multiple hypoechoic lesions throughout both kidneys which are incompletely characterized in the absence of intravenous contrast material. However, prior imaging demonstrated multiple simple cysts. No nephrolithiasis visualized. Upper abdominal organs are unremarkable. IMPRESSION: 1. Findings most consistent with an infectious/ inflammatory process involving the bilateral lower lobes. Given the distribution, aspiration is a consideration as is multilobar pneumonia. 2. Coronary artery calcifications. 3. Bilateral renal cysts. Electronically Signed   By: Jacqulynn Cadet M.D.   On: 09/29/2015 16:00    EKG: Independently reviewed.  Assessment/Plan Principal Problem:   HCAP (healthcare-associated pneumonia) Active Problems:   Hypertensive heart disease with CHF (congestive heart failure) ([place])  Dilated cardiomyopathy (HCC)   Leukocytosis   CKD (chronic kidney disease) stage 3, GFR 30-59 ml/min   Vascular dementia without behavioral disturbance   ICD (implantable cardioverter-defibrillator) in place   TOBACCO ABUSE   Chronic systolic heart failure (HCC)   COPD (chronic obstructive pulmonary disease) (HCC)   History of embolic stroke   Hyperglycemia   DM2 (diabetes mellitus, type 2) (HCC)   Hx pulmonary embolism   Anemia, chronic disease   Aphasia   Coronary artery disease involving native coronary artery of native heart without angina pectoris   Late effects of CVA (cerebrovascular accident)   Chronic systolic CHF (congestive heart failure) (HCC)   Generalized weakness   Protein-calorie malnutrition, severe (HCC)   Crohn's disease ([place])   Cardiac syncope  Healthcare-Associated Pneumonia with SIRS - Admit for IV zosyn and vancomycin per pharmacist, already started in ED.   - continue respiratory support - Nebs ordered as needed for COPD symptoms - Follow chest xray, follow CBC,  blood cultures - check urine strep and legionella antigens - check lactic acid as patient was initially hypotensive per EMS report, responded well to gentle IVF challenge.  Follow closely.  - I'm very concerned about aspiration as this is a multifocal pneumonia, I am asking for SLP consult and aspiration precautions ordered.  - Hypoxia - resolved now, Pt sat 95% on room air.    Possible Syncope - Pt does have history of cardiac syncope, but presenting history not convincing that he had a syncopal event, monitor for now. - Fall precautions recommended.  - Does not sound like he had seizure given presenting history and has no known history of epilepsy. Does not appear that patient had a fall which would be concerning with him being on xarelto. He did have a normal EEG on 09/09/15 done during last hospitalization.   Anemia Of Chronic Disease - Pt received received 2 units packed RBC transfusion during last admission and Hg is >9 today.  - Recently tested Stool guaiac-negative 2 with rectal exam and a large BM.  Patient is followed by Dr.Buccini, colonoscopy in 2012 that did not show any colonic mass, has a history of Crohn's disease but the colonoscopy did not show any evidence of Crohn's. C. difficile negative. Patient did not have any further stools during recent hospitalization, will monitor.   Advanced dementia - CT head showed no evidence of acute intracranial or cervical spine injury, extensive chronic ischemic changes  - Continue Aricept   Diabetes mellitus type 2 -Most recent Hemoglobin A1c 6.8, will prescribe sliding scale insulin with accu-check monitoring.   COPD and Tobacco Abuse - Nebs ordered and respiratory support as needed - pt quit tobacco Dec 2016.   Stage 3, CKD - creatinine stable at baseline, will follow - avoid nephrotoxins and renally dose as appropriate - ask pharmacist to assist with antibiotic dosing  Anxiety Disorder, failure to thrive -continue prn Xanax at  bedtime   Dilated cardiomyopathy with an EF of 20-25% based on 2-D echo in February 2016 History of coronary artery disease with prior PCI to LAD in 2015 - Cardiac enzymes negative so far  History of pulmonary embolism chronically on Xarelto, after failing warfarin due to difficulty maintaining a therapeutic INR - Patient also had history of embolic CVA in 3/71, 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 Erlinda Hong)   [employer] AICD placed for primary prevention  of SCD by Dr. Caryl Comes - was recently interrogated because of recent falls.  ED requested it be interrogated once more.   Severe protein calorie malnutrition - will request dietitian to evaluate while in hospital.     DVT Prophylaxis: Xarelto Code Status: Full  M.O.S.T. Form reviewed Family Communication: Family Communication: I called wife and discussed over phone Emergency Contacts: Extended Emergency Contact Information Primary Emergency Contact: Teegarden,[name] Address: [address]  [place], [place] [address] [place] Phone: [phone] Mobile Phone: [phone] Relation: Spouse Secondary Emergency Contact: [name],[name]  [place], [place] Phone: [phone] Relation: Daughter Disposition Plan: SNF in 3-5 days  Time spent: 52 mins  Irwin Brakeman, MD [facility] Pager [phone]  If 7PM-7AM, please contact night-coverage [email or web address] Password [facility] 09/29/2015, 4:33 PM

## 2015-09-29 NOTE — Progress Notes (Signed)
Pharmacy Antibiotic Note  Grant Lara is a 75 y.o. male admitted on 09/29/2015 with pneumonia.  Pharmacy has been consulted for vancomycin and zosyn dosing. Patient received Vanc 1g*1 and Zosyn 3.375g*1 in the ED.  Plan: Vancomycin 750 IV every 24 hours.  Goal trough 15-20 mcg/mL. Zosyn 3.375g IV q8h (4 hour infusion).  Follow renal function closely, patient is borderline for dose adjustment  Weight: 121 lb 0.5 oz (54.9 kg)  Temp (24hrs), Avg:98.1 F (36.7 C), Min:98.1 F (36.7 C), Max:98.1 F (36.7 C)   Recent Labs Lab 09/29/15 1210  WBC 19.2*  CREATININE 1.61*    Estimated Creatinine Clearance: 30.3 mL/min (by C-G formula based on Cr of 1.61).    No Known Allergies  Antimicrobials this admission: 6/16 vanc >>  6/16 zosyn >>   Dose adjustments this admission: NA  Microbiology results: NA  Thank you for allowing pharmacy to be a part of this patient's care.   Melburn Popper, PharmD Clinical Pharmacy Resident Pager: [phone] 09/29/2015 6:54 PM

## 2015-09-29 NOTE — ED Notes (Signed)
Pt presents via [facility] from [facility] for possible syncopal episode vs seizure.  Pt was at PT when he had a 30 second episode of "unresponsiveness" with a right sided gaze.  Pt was sitting, no fall or head injury.  After episode pt had generalized BL weakness.  No hx of sx.  Pt responsive to verbal stimuli.  GCS reported 13.  Follows commands.  EMS reports rhonchi in lower bases with nonproductive cough.  O2 80s RA on arrival.  O2 applied.  EKG A paced.  BP-108/71 P-66.

## 2015-09-29 NOTE — ED Notes (Signed)
BNP added on to blood work.  Lab notified.

## 2015-09-29 NOTE — Progress Notes (Signed)
Grant Lara is a 75 y.o. male patient admitted from ED awake, alert - oriented  X 2 - no acute distress noted.  VSS - Blood pressure 119/61, pulse 67, temperature 98.1 F (36.7 C), temperature source Oral, resp. rate 20, weight 54.9 kg (121 lb 0.5 oz), SpO2 92 %.    IV in place, occlusive dsg intact without redness.  Orientation to room, and floor completed with information packet given to patient.  Patient declined safety video at this time.  Admission INP armband ID verified with patient/family, and in place.   SR up x 2, fall assessment complete. Patient educated on calling nursing staff before getting out of bed.   Will cont to eval and treat per MD orders.  Lynann Beaver, RN 09/29/2015 6:51 PM

## 2015-09-29 NOTE — ED Notes (Signed)
[employer] notified charge RN that pt did not have any episodes although ventricular function is turned off, admitting MD Wynetta Emery paged to make aware.

## 2015-09-30 ENCOUNTER — Encounter: Payer: Self-pay | Admitting: Internal Medicine

## 2015-09-30 ENCOUNTER — Inpatient Hospital Stay (HOSPITAL_COMMUNITY): Payer: Medicare Other

## 2015-09-30 DIAGNOSIS — E1122 Type 2 diabetes mellitus with diabetic chronic kidney disease: Secondary | ICD-10-CM | POA: Insufficient documentation

## 2015-09-30 DIAGNOSIS — E872 Acidosis, unspecified: Secondary | ICD-10-CM | POA: Insufficient documentation

## 2015-09-30 DIAGNOSIS — E440 Moderate protein-calorie malnutrition: Secondary | ICD-10-CM | POA: Insufficient documentation

## 2015-09-30 DIAGNOSIS — N183 Chronic kidney disease, stage 3 unspecified: Secondary | ICD-10-CM | POA: Insufficient documentation

## 2015-09-30 LAB — COMPREHENSIVE METABOLIC PANEL
ALBUMIN: 2.5 g/dL — AB (ref 3.5–5.0)
ALK PHOS: 109 U/L (ref 38–126)
ALT: 37 U/L (ref 17–63)
AST: 19 U/L (ref 15–41)
Anion gap: 9 (ref 5–15)
BUN: 20 mg/dL (ref 6–20)
CALCIUM: 8.8 mg/dL — AB (ref 8.9–10.3)
CHLORIDE: 101 mmol/L (ref 101–111)
CO2: 25 mmol/L (ref 22–32)
CREATININE: 1.22 mg/dL (ref 0.61–1.24)
GFR calc non Af Amer: 56 mL/min — ABNORMAL LOW (ref >60)
GLUCOSE: 103 mg/dL — AB (ref 65–99)
Potassium: 4 mmol/L (ref 3.5–5.1)
SODIUM: 135 mmol/L (ref 135–145)
Total Bilirubin: 1 mg/dL (ref 0.3–1.2)
Total Protein: 6.3 g/dL — ABNORMAL LOW (ref 6.5–8.1)

## 2015-09-30 LAB — GLUCOSE, CAPILLARY
Glucose-Capillary: 120 mg/dL — ABNORMAL HIGH (ref 65–99)
Glucose-Capillary: 180 mg/dL — ABNORMAL HIGH (ref 65–99)
Glucose-Capillary: 172 mg/dL — ABNORMAL HIGH (ref 65–99)
Glucose-Capillary: 126 mg/dL — ABNORMAL HIGH (ref 65–99)

## 2015-09-30 LAB — CBC
HCT: 29.6 % — ABNORMAL LOW (ref 39.0–52.0)
HEMOGLOBIN: 8.4 g/dL — AB (ref 13.0–17.0)
MCH: 20.5 pg — AB (ref 26.0–34.0)
MCHC: 28.4 g/dL — ABNORMAL LOW (ref 30.0–36.0)
MCV: 72.2 fL — ABNORMAL LOW (ref 78.0–100.0)
PLATELETS: 332 10*3/uL (ref 150–400)
RBC: 4.1 MIL/uL — AB (ref 4.22–5.81)
RDW: 21.1 % — ABNORMAL HIGH (ref 11.5–15.5)
WBC: 18.4 10*3/uL — AB (ref 4.0–10.5)

## 2015-09-30 MED ORDER — ENSURE ENLIVE PO LIQD
237.0000 mL | Freq: Two times a day (BID) | ORAL | Status: DC
  Administered 2015-09-30 – 2015-10-04 (×7): 237 mL via ORAL

## 2015-09-30 MED ORDER — IPRATROPIUM-ALBUTEROL 0.5-2.5 (3) MG/3ML IN SOLN: 3.0000 mL | Freq: Four times a day (QID) | RESPIRATORY_TRACT | Status: DC

## 2015-09-30 MED ORDER — LEVALBUTEROL HCL 0.63 MG/3ML IN NEBU: 0.6300 mg | INHALATION_SOLUTION | Freq: Four times a day (QID) | RESPIRATORY_TRACT | Status: DC | PRN

## 2015-09-30 MED ORDER — IPRATROPIUM-ALBUTEROL 0.5-2.5 (3) MG/3ML IN SOLN: 3.0000 mL | Freq: Four times a day (QID) | RESPIRATORY_TRACT | Status: DC | PRN

## 2015-09-30 NOTE — NC FL2 (Signed)
[place] LEVEL OF CARE SCREENING TOOL     IDENTIFICATION  Patient Name: Grant Lara Birthdate: [birth year] Sex: male Admission Date (Current Location): 09/29/2015  [facility] and [place] Number:  Herbalist and Address:  The . [facility], [place] [address], [place], [place] [address]      Provider Number: [id number]  Attending Physician Name and Address:  Hosie Poisson, MD  Relative Name and Phone Number:  [name], spouse, [phone]    Current Level of Care: Hospital Recommended Level of Care: [place] Prior Approval Number:    Date Approved/Denied:   PASRR Number: [id number]  Discharge Plan: SNF    Current Diagnoses: Patient Active Problem List   Diagnosis Date Noted  . ICD (implantable cardioverter-defibrillator) in place 09/29/2015  . HCAP (healthcare-associated pneumonia) 09/29/2015  . Hypoxia 09/29/2015  . Vascular dementia without behavioral disturbance 09/25/2015  . Anemia 09/08/2015  . Fall 09/08/2015  . Depression 07/15/2015  . OA (osteoarthritis) 06/06/2015  . Bilateral lower extremity pain 05/14/2015  . BPH (benign prostatic hyperplasia) 04/10/2015  . Anxiety 04/10/2015  . FTT (failure to thrive) in adult 11/05/2014  . Mental status change 10/30/2014  . Protein-calorie malnutrition, severe ([place]) 10/30/2014  . Increased urinary frequency 10/30/2014  . Faintness   . Generalized weakness 09/01/2014  . 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 Cardiac syncope   . Other specified hypotension   . Chronic systolic CHF (congestive heart failure) ([place])   . Late effects of CVA (cerebrovascular accident) 07/16/2014  . Bradycardia 06/01/2014  . Syncope 05/30/2014  . Stroke ([place])   . CVA (cerebral infarction)   . Hyperlipidemia   . Coronary artery disease involving native coronary artery of native heart without angina pectoris   . LVH (left ventricular hypertrophy)   . NSVT (nonsustained ventricular  tachycardia) ([place]) 04/16/2014  . CKD (chronic kidney disease) stage 3, GFR 30-59 ml/min 04/16/2014  . Aphasia 04/15/2014  . Abnormal EKG   . Anemia, chronic disease 11/21/2013  . DM2 (diabetes mellitus, type 2) ([place]) 11/20/2013  . Hx pulmonary embolism 11/20/2013  . Leukocytosis 11/20/2013  . Hyperglycemia 08/22/2013  . Dehydration 08/21/2013  . ARF (acute renal failure) ([place]) 08/21/2013  . Abnormal ECG 08/21/2013  . Delirium 07/18/2013  . Small bowel obstruction ([place]) 07/13/2013  . Encounter for therapeutic drug monitoring 05/11/2013  . Crohn's disease ([place]) 02/16/2012  . Abnormal CT scan of lung 11/08/2011  . Bilateral leg and foot pain 11/06/2011  . HLD (hyperlipidemia) 03/28/2011  . Pulmonary embolism ([place]) 06/05/2010  . History of embolic stroke 02/63/7858  . DVT (deep venous thrombosis) ([place]) 06/05/2010  . EMPHYSEMATOUS BLEB 04/06/2010  . COPD (chronic obstructive pulmonary disease) ([place]) 04/05/2010  . Chronic systolic heart failure ([place]) 01/10/2010  . Memory loss 04/03/2009  . CHEST PAIN 03/08/2009  . Hypertensive heart disease with CHF (congestive heart failure) ([place]) 02/10/2009  . TOBACCO ABUSE 12/12/2008  . Dilated cardiomyopathy ([place]) 12/12/2008  . ABNORMAL ELECTROCARDIOGRAM 12/12/2008    Orientation RESPIRATION BLADDER Height & Weight     Self, Place  Normal Continent Weight: 54.9 kg (121 lb 0.5 oz) Height:     BEHAVIORAL SYMPTOMS/MOOD NEUROLOGICAL BOWEL NUTRITION STATUS      Continent Diet (Please see DC Summary)  AMBULATORY STATUS COMMUNICATION OF NEEDS Skin   Extensive Assist Verbally Normal                       Personal Care Assistance Level of  Assistance  Bathing, Feeding, Dressing Bathing Assistance: Maximum assistance Feeding assistance: Maximum assistance Dressing Assistance: Limited assistance     Functional Limitations Info             SPECIAL CARE FACTORS FREQUENCY                       Contractures      Additional  Factors Info  Code Status, Allergies Code Status Info: Full Allergies Info: NKA           Current Medications (09/30/2015):  This is the current hospital active medication list Current Facility-Administered Medications  Medication Dose Route Frequency Provider Last Rate Last Dose  . 0.9 %  sodium chloride infusion  250 mL Intravenous PRN Clanford Marisa Hua, MD      . acetaminophen (TYLENOL) tablet 650 mg  650 mg Oral Q6H PRN Clanford Marisa Hua, MD       Or  . acetaminophen (TYLENOL) suppository 650 mg  650 mg Rectal Q6H PRN Clanford Marisa Hua, MD      . ALPRAZolam Duanne Moron) tablet 0.5 mg  0.5 mg Oral QHS PRN Clanford Marisa Hua, MD      . aspirin EC tablet 81 mg  81 mg Oral Daily Clanford Marisa Hua, MD   81 mg at 09/30/15 0916  . atorvastatin (LIPITOR) tablet 10 mg  10 mg Oral q1800 Clanford L Johnson, MD      . donepezil (ARICEPT) tablet 10 mg  10 mg Oral QHS Clanford Marisa Hua, MD   10 mg at 09/29/15 2207  . feeding supplement (ENSURE ENLIVE) (ENSURE ENLIVE) liquid 237 mL  237 mL Oral BID BM Hosie Poisson, MD   237 mL at 09/30/15 1302  . insulin aspart (novoLOG) injection 0-9 Units  0-9 Units Subcutaneous TID WC Clanford Marisa Hua, MD   2 Units at 09/30/15 1302  . latanoprost (XALATAN) 0.005 % ophthalmic solution 1 drop  1 drop Both Eyes QHS Clanford Marisa Hua, MD   1 drop at 09/29/15 2207  . levalbuterol (XOPENEX) nebulizer solution 0.63 mg  0.63 mg Nebulization Q6H PRN Clanford L Johnson, MD      . piperacillin-tazobactam (ZOSYN) IVPB 3.375 g  3.375 g Intravenous Q8H Melburn Popper, RPH   3.375 g at 09/30/15 1302  . rivaroxaban (XARELTO) tablet 20 mg  20 mg Oral Q supper Clanford Marisa Hua, MD   20 mg at 09/29/15 2206  . senna (SENOKOT) tablet 8.6 mg  1 tablet Oral q morning - 10a Clanford Marisa Hua, MD   8.6 mg at 09/30/15 0916  . sertraline (ZOLOFT) tablet 50 mg  50 mg Oral BH-q7a Clanford Marisa Hua, MD   50 mg at 09/30/15 0618  . sodium chloride flush (NS) 0.9 % injection 3 mL  3 mL  Intravenous Q12H Clanford L Johnson, MD   3 mL at 09/29/15 2200  . sodium chloride flush (NS) 0.9 % injection 3 mL  3 mL Intravenous PRN Clanford L Johnson, MD      . tamsulosin (FLOMAX) capsule 0.4 mg  0.4 mg Oral Daily Clanford Marisa Hua, MD   0.4 mg at 09/30/15 0916  . vancomycin (VANCOCIN) IVPB 750 mg/150 ml premix  750 mg Intravenous Q24H Melburn Popper, [facility]         Discharge Medications: Please see discharge summary for a list of discharge medications.  Relevant Imaging Results:  Relevant Lab Results:   Additional Information SSN:[id number]  LCSWA

## 2015-09-30 NOTE — Clinical Social Work Note (Signed)
Clinical Social Work Assessment  Patient Details  Name: Grant Lara MRN: [id number] Date of Birth: Aug 29, 1940  Date of referral:  09/30/15               Reason for consult:  Facility Placement                Permission sought to share information with:  Facility Sport and exercise psychologist, Family Supports Permission granted to share information::  Yes, Verbal Permission Granted  Name::     Grant Lara  Agency::  [facility]  Relationship::  Spouse  Contact Information:  [phone]  Housing/Transportation Living arrangements for the past 2 months:  [place] of Information:  Spouse (Patient is disoriented) Patient Interpreter Needed:  None Criminal Activity/Legal Involvement Pertinent to Current Situation/Hospitalization:  No - Comment as needed Significant Relationships:  Adult Children, Spouse Lives with:  Self, Spouse Do you feel safe going back to the place where you live?  Yes Need for family participation in patient care:  Yes (Comment)  Care giving concerns:   CSW received assessment regarding patient discharge plan. Patient is disoriented. CSW spoke with patient's wife.    Social Worker assessment / plan:  Patient is from [place] and will return there at discharge by [place].  Employment status:  Retired Nurse, adult PT Recommendations:  Not assessed at this time Information / Referral to community resources:  [place]  Patient/Family's Response to care:  Patient's wife expressed agreement with discharge plan.  Patient/Family's Understanding of and Emotional Response to Diagnosis, Current Treatment, and Prognosis:  Patient is realistic regarding therapy needs. No questions/concerns about plan or treatment.    Emotional Assessment Appearance:  Appears stated age Attitude/Demeanor/Rapport:  Unable to Assess Affect (typically observed):  Unable to Assess Orientation:  Oriented to Self, Oriented to Place Alcohol /  Substance use:  Not Applicable Psych involvement (Current and /or in the community):  No (Comment)  Discharge Needs  Concerns to be addressed:  Care Coordination Readmission within the last 30 days:  Yes Current discharge risk:  None Barriers to Discharge:  Continued Medical Work up   [employer], [place] 09/30/2015, 4:17 PM

## 2015-09-30 NOTE — Evaluation (Signed)
Clinical/Bedside Swallow Evaluation Patient Details  Name: Grant Lara MRN: [id number] Date of Birth: 05/11/1940  Today's Date: 09/30/2015 Time: SLP Start Time (ACUTE ONLY): 1000 SLP Stop Time (ACUTE ONLY): 1015 SLP Time Calculation (min) (ACUTE ONLY): 15 min  Past Medical History:  Past Medical History  Diagnosis Date  . Hypertension   . Dilated cardiomyopathy ([place])     2/12: EF 30-35%, trivial AI, mild RAE.  EF 2014 40 -45%  . CAD (coronary artery disease)     LHC 9/05 with Dr. Einar Gip:  dLM 20-30%, LAD 85%, oD1 20-30%.  PCI:  Taxus DES to LAD; Dx jailed and tx with POBA.  Last myoview 12/10: inf scar, no ischemia, EF 29%.  . DVT (deep venous thrombosis) ([place])   . Pulmonary embolus ([facility])     chronic coumadin  . 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 PFO (patent foramen ovale)     Not mentioned on 2014 echo.  [name] HLD (hyperlipidemia)   . Crohn's disease ([place])   . Nephrolithiasis   . Diabetes mellitus     11/05/11 "borderline; don't take medications"  . Stroke [facility]) 1993    "left arm can't hold steady; leg too"  . Arthritis     "used to have a touch in my legs"  . Memory difficulties   . Swelling of both ankles      09-22-13 occ.feet, but denies pain.  . Bradycardia 06/01/2014  . Dementia   . Hyperlipidemia   . CHF (congestive heart failure) ([place])   . Pneumonia ~ 2011    09-22-13 denies any recent SOB or breathing problems  . HCAP (healthcare-associated pneumonia) 09/29/2015  . AICD (automatic cardioverter/defibrillator) present    Past Surgical History:  Past Surgical History  Procedure Laterality Date  . Colon surgery  1994; 1996    "for Crohn's disease"  . Appendectomy    . Implantable cardioverter defibrillator implant N/A 06/02/2014    Procedure: IMPLANTABLE CARDIOVERTER DEFIBRILLATOR IMPLANT;  Surgeon: [name], MD;  Location: [facility] C[facility] LAB;  Service: Cardiovascular;  Laterality: N/A;   HPI:  Grant Lara is a 75 y.o. male who was recently discharged presents via [facility] from  [facility] for possible syncopal episode vs seizure. Pt was at PT when he had a 30 second episode of "unresponsiveness" with a right sided gaze. Pt was sitting, no fall or head injury. After episode pt had generalized bilateral weakness. No history of seizure known. Pt responsive to verbal stimuli. GCS reported 13. Follows commands. EMS reported rhonchi in lower bases with nonproductive cough. Oxygen sat in 80s RA on arrival. He has a PMH of dementia, hypertension, hyperlipidemia, diabetes mellitus, depression, CAD, DVT, PE on Xarelto, PFO, chronic disease, stroke, BPH, systolic congestive heart failure (EF 20-25%), who presents with generalized weakness and increased cough, chest congestion and shortness of breath.The patient was evaluated in the ED and found to have an acute leukocytosis, hypoxia and abnormal lung sounds. A CT of the chest was obtained that was significant for multifocal bilateral pneumonia.   Assessment / Plan / Recommendation Clinical Impression  Pt demonstrates very subtle signs of dysphagia with thin liquids which may or may not be indicative of aspiration. Given concern for an aspiration pna, pt would benefit from objective testing to determine tolerance of current regular texture diet with thin liquids. Will attempt to address this today.     Aspiration Risk  Mild aspiration risk    Diet Recommendation Regular;Thin liquid   Liquid Administration via:  Cup;Straw Medication Administration: Whole meds with liquid Supervision: Staff to assist with self feeding Compensations: Slow rate;Small sips/bites Postural Changes: Seated upright at 90 degrees    Other  Recommendations   MBS for objective assessment of swallowing    Swallow Study   General HPI: Grant Lara is a 75 y.o. male who was recently discharged presents via [place] from [facility] for possible syncopal episode vs seizure. Pt was at PT when he had a 30 second episode of  "unresponsiveness" with a right sided gaze. Pt was sitting, no fall or head injury. After episode pt had generalized bilateral weakness. No history of seizure known. Pt responsive to verbal stimuli. GCS reported 13. Follows commands. EMS reported rhonchi in lower bases with nonproductive cough. Oxygen sat in 80s RA on arrival. He has a PMH of dementia, hypertension, hyperlipidemia, diabetes mellitus, depression, CAD, DVT, PE on Xarelto, PFO, chronic disease, stroke, BPH, systolic congestive heart failure (EF 20-25%), who presents with generalized weakness and increased cough, chest congestion and shortness of breath.The patient was evaluated in the ED and found to have an acute leukocytosis, hypoxia and abnormal lung sounds. A CT of the chest was obtained that was significant for multifocal bilateral pneumonia. Type of Study: Bedside Swallow Evaluation Previous Swallow Assessment: none Diet Prior to this Study: Regular;Thin liquids Temperature Spikes Noted: No Respiratory Status: Room air History of Recent Intubation: No Behavior/Cognition: Alert;Cooperative;Confused;Requires cueing Oral Cavity Assessment: Within Functional Limits Oral Care Completed by SLP: No Oral Cavity - Dentition: Poor condition Vision: Functional for self-feeding Self-Feeding Abilities: Needs assist Patient Positioning: Upright in chair Baseline Vocal Quality: Normal Volitional Cough: Strong Volitional Swallow: Able to elicit    Oral/Motor/Sensory Function Overall Oral Motor/Sensory Function: Within functional limits   Ice Chips     Thin Liquid Thin Liquid: Impaired Pharyngeal  Phase Impairments: Wet Vocal Quality    Nectar Thick Nectar Thick Liquid: Within functional limits   Honey Thick Honey Thick Liquid: Not tested   Puree Puree: Within functional limits   Solid   GO   Solid: Within functional limits       [facility], MA CCC-SLP [phone]  Grant Lara, Grant Lara 09/30/2015,10:36  AM

## 2015-09-30 NOTE — Evaluation (Signed)
Physical Therapy Evaluation Patient Details Name: Grant Lara MRN: [id number] DOB: December 16, 1940 Today's Date: 09/30/2015   History of Present Illness  Grant Lara is a 75 y.o. male who was recently discharged presents via [facility] from [facility] for possible syncopal episode vs seizure. Pt was at PT when he had a 30 second episode of "unresponsiveness" with a right sided gaze. Pt was sitting, no fall or head injury. After episode pt had generalized bilateral weakness. No history of seizure known. Pt responsive to verbal stimuli. GCS reported 13. Follows commands. EMS reported rhonchi in lower bases with nonproductive cough. Oxygen sat in 80s RA on arrival. He has a PMH of dementia, hypertension, hyperlipidemia, diabetes mellitus, depression, CAD, DVT, PE on Xarelto, PFO, chronic disease, stroke, BPH, systolic congestive heart failure (EF 20-25%), who presents with generalized weakness and increased cough, chest congestion and shortness of breath.The patient was evaluated in the ED and found to have an acute leukocytosis, hypoxia and abnormal lung sounds. A CT of the chest was obtained that was significant for multifocal bilateral pneumonia.   Clinical Impression  Pt admitted with above diagnosis. Pt currently with functional limitations due to the deficits listed below (see PT Problem List). Pt was able to ambulate with RW but with some decr safety needing incr cues for safety.  Will need to continue therapy at SNF prior to d/c home.  Pt agrees.  Pt will benefit from skilled PT to increase their independence and safety with mobility to allow discharge to the venue listed below.      Follow Up Recommendations SNF;Supervision/Assistance - 24 hour    Equipment Recommendations  Other (comment) (TBA)    Recommendations for Other Services       Precautions / Restrictions Precautions Precautions: Fall Restrictions Weight Bearing Restrictions: No      Mobility  Bed  Mobility Overal bed mobility: Needs Assistance Bed Mobility: Sit to Supine       Sit to supine: Min assist   General bed mobility comments: Assist for trunk. Increased time. Multmodal cues for completion of task.  Transfers Overall transfer level: Needs assistance Equipment used: Rolling walker (2 wheeled) Transfers: Sit to/from Stand Sit to Stand: Min assist Stand pivot transfers: Min assist       General transfer comment: Assist to rise, stabilize, control descent. Multimodal cues for hand placement, safety, technique.  Ambulation/Gait Ambulation/Gait assistance: Min assist;+2 safety/equipment Ambulation Distance (Feet): 100 Feet Assistive device: Rolling walker (2 wheeled) Gait Pattern/deviations: Step-to pattern;Decreased step length - left;Decreased stance time - left;Decreased weight shift to left;Decreased dorsiflexion - left;Drifts right/left;Narrow base of support;Trunk flexed   Gait velocity interpretation: Below normal speed for age/gender General Gait Details: Pt needed incr cues for safety with RW with pt not ambulating close encough to RW.  Constant cues to get close to rW. Pt leaving left foot behind and even with cues could not take larger steps with steps worsening as pt fatigued. Pt with decr postural control as he fatigued as well.  Pt with overall poor safety awareness.   Stairs            Wheelchair Mobility    Modified Rankin (Stroke Patients Only)       Balance Overall balance assessment: Needs assistance;History of Falls Sitting-balance support: Bilateral upper extremity supported;Feet supported Sitting balance-Leahy Scale: Poor Sitting balance - Comments: needed Ue support for balance   Standing balance support: Bilateral upper extremity supported;During functional activity Standing balance-Leahy Scale: Poor Standing balance comment: relies  on RW for balance.              High level balance activites: Direction changes;Turns;Sudden  stops High Level Balance Comments: pt needed min assist as he needed guidance around obstacles.              Pertinent Vitals/Pain Pain Assessment: No/denies pain  VSS    Home Living Family/patient expects to be discharged to:: Skilled nursing facility Living Arrangements: Spouse/significant other Wife can only provide intermittent assist as she works.                 Additional Comments: pt with expressive difficulties and no family present    Prior Function Level of Independence: Needs assistance   Gait / Transfers Assistance Needed: A by staff at SNF with RW  ADL's / Homemaking Assistance Needed: A by staff at SNF  Comments: Pt was receiving therapy at SNF prior to admit     Hand Dominance   Dominant Hand: Right    Extremity/Trunk Assessment   Upper Extremity Assessment: Defer to OT evaluation           Lower Extremity Assessment: Generalized weakness      Cervical / Trunk Assessment: Normal  Communication   Communication: Expressive difficulties  Cognition Arousal/Alertness: Awake/alert Behavior During Therapy: Flat affect Overall Cognitive Status: History of cognitive impairments - at baseline                      General Comments      Exercises        Assessment/Plan    PT Assessment Patient needs continued PT services  PT Diagnosis Generalized weakness   PT Problem List Decreased strength;Decreased range of motion;Decreased activity tolerance;Decreased balance;Decreased mobility;Decreased knowledge of use of DME;Pain  PT Treatment Interventions DME instruction;Gait training;Functional mobility training;Therapeutic activities;Patient/family education;Balance training;Therapeutic exercise   PT Goals (Current goals can be found in the Care Plan section) Acute Rehab PT Goals Patient Stated Goal: to get better PT Goal Formulation: With patient Time For Goal Achievement: 10/14/15 Potential to Achieve Goals: Good    Frequency Min  2X/week   Barriers to discharge Decreased caregiver support      Co-evaluation               End of Session Equipment Utilized During Treatment: Gait belt Activity Tolerance: Patient limited by fatigue Patient left: in bed;with call bell/phone within reach;with bed alarm set (ST asked for pt to be back in bed due to going for MBS) Nurse Communication: Mobility status         Time: 1423-9532 PT Time Calculation (min) (ACUTE ONLY): 15 min   Charges:   PT Evaluation $PT Eval Moderate Complexity: 1 Procedure     PT G CodesIrwin Brakeman F 10-03-15, 12:24 PM Keylani Perlstein,PT Acute Rehabilitation [phone] (pager)

## 2015-09-30 NOTE — Progress Notes (Signed)
Initial Nutrition Assessment  DOCUMENTATION CODES:  Not applicable   Thin, but does not meet malnutrition criteria. If was consulted on basis of albumin, this is likely falsely low as it is a negative acute phase reactant  INTERVENTION:  Ensure Enlive po BID, each supplement provides 350 kcal and 20 grams of protein   NUTRITION DIAGNOSIS:  Increased nutrient needs related to acute illness as evidenced by estimated nutritional requirements for this condition  GOAL:  Patient will meet greater than or equal to 90% of their needs  MONITOR:  PO intake, Supplement acceptance  REASON FOR ASSESSMENT:  Consult  ("Severe malnutrition")  ASSESSMENT:  75 y/o male PMHx HTN, CAD, Crohns. DM, CHF, HLD, dementia, CKD3, CVA, Aphasia, COPD. Presents from SNF with "unresponsiveness". CT shows multifocal bilateral pna. Admitted for HCAP  Pt has dx of dementia, though presented well and answered questions appropriately. [name] However his reported UBW was very off base from documentation. He is slightly hard to understand with aphasia  Pt says that at his SNF he eats well, 3 meals a day. He has a good appetite and today says he is very hungry-documentation shows he ate 75% of his breakfast. He did not know what diet order he was on at [facility]. He did not receive any supplements or vitamins/minerals there.   He says his UBW is 175 lbs. Per EMR documentation, he not weighed this much in the last 7 years, if ever. When told he is 121 lbs he adamantly denies: "No, no, I disagree with that".  Inquired about Crohns history. He notes no recent issues.   RD talked about importance of protein/fluid during acute illness. He was agreeable to supplementation. Asked what, if anything specific he wanted for lunch, He didn't seem to care.   Noted SLP evaluation and possible concern for Aspiration PNA  Physical assessment reveals mild-moderate muscle wasting in lower body, though could be muscle atrophy from little  ambulation, unsure of baseline. He is noted to still have significant tricep fat/muscle stores.   Pt has slowly lost weight. He was in the 130's in early 2016/late 2015,  And 150-160 lbs the years prior to that.  However, he appears to have been able to maintain his weight ~120 lbs x 1 year. This rate of loss is not in line with malnutrition criteria.   H/H: 8.4/29.6, WBC: 18.4, Albumin: 2.5, total pro: 6.3  Recent Labs Lab 09/29/15 1210 09/30/15 0512  NA 136 135  K 4.0 4.0  CL 105 101  CO2 23 25  BUN 23* 20  CREATININE 1.61* 1.22  CALCIUM 8.7* 8.8*  GLUCOSE 126* 103*   Diet Order:  Diet heart healthy/carb modified Room service appropriate?: Yes; Fluid consistency:: Thin  Skin:  Reviewed, no issues  Last BM:  Unknown  Height:  Ht Readings from Last 1 Encounters:  09/13/15 5' 3"  (1.6 m)   Weight:  Wt Readings from Last 1 Encounters:  09/30/15 121 lb 0.5 oz (54.9 kg)   Wt Readings from Last 10 Encounters:  09/30/15 121 lb 0.5 oz (54.9 kg)  09/13/15 121 lb (54.885 kg)  09/12/15 121 lb (54.885 kg)  09/07/15 121 lb (54.885 kg)  08/07/15 122 lb 6.4 oz (55.52 kg)  06/19/15 119 lb (53.978 kg)  05/10/15 119 lb (53.978 kg)  04/26/15 119 lb (53.978 kg)  11/16/14 113 lb (51.256 kg)  10/31/14 120 lb (54.432 kg)   Ideal Body Weight:  56.36 kg  BMI:  Body mass index is 21.45 kg/(m^2).  Estimated Nutritional Needs:  Kcal:  1600-1800 (29-33 kcal/kg bw) Protein:  66-77 g pro (1.2-1.4g/kg bw) Fluid:  >1.7 liters  EDUCATION NEEDS:  No education needs identified at this time  Burtis Junes RD, LDN, [facility] Pager: [phone] 09/30/2015 11:03 AM

## 2015-09-30 NOTE — Progress Notes (Signed)
MBSS complete. Full report located under chart review in imaging section. Ranika Mcniel, MA CCC-SLP [phone]  

## 2015-09-30 NOTE — Progress Notes (Signed)
PROGRESS NOTE    Grant Lara  PRF:[id number] DOB: [birth year] DOA: 09/29/2015 PCP: [name], MD    Brief Narrative: Grant Lara is a 75 y.o. male who was recently discharged presents via [facility] from [facility] for possible  presyncopal episode vs seizure. He was found to have multilobar pneumonia, possibly aspiration.   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)   Dilated cardiomyopathy (HCC)   Chronic systolic heart failure (HCC)   COPD (chronic obstructive pulmonary disease) (HCC)   History of embolic stroke   Crohn's disease ([place])   Hyperglycemia   DM2 (diabetes mellitus, type 2) (HCC)   Hx pulmonary embolism   Leukocytosis   Anemia, chronic disease   Aphasia   CKD (chronic kidney disease) stage 3, GFR 30-59 ml/min   Coronary artery disease involving native coronary artery of native heart without angina pectoris   Late effects of CVA (cerebrovascular accident)   Cardiac syncope   Chronic systolic CHF (congestive heart failure) (HCC)   Generalized weakness   Protein-calorie malnutrition, severe (HCC)   Vascular dementia without behavioral disturbance   ICD (implantable cardioverter-defibrillator) in place   Hypoxia  Multifocal pneumonia, possibly aspiration: Admitted to telemetry.  Started on Broad spectrum antibiotics with IV vancomycin and zosyn, urine for strep and legionella antigen  Ordered. Blood cultures ordered and pending.  Sputum cultures ordered/  SLP eval ordered and plan for MBS today.  Lactic acid normalized.    Diabetes mellitus: CBG (last 3)   Recent Labs  09/29/15 2354 09/30/15 0754 09/30/15 1158  GLUCAP 128* 120* 180*    Resume SSI   Chronic systolic heart failure: appears compensated.  No fluid over load.  D/c IV fluids.    Dementia: no behavioral abnormalities.     Dilated cardiomyopathy with an EF of 20-25% based on 2-D echo in February 2016 History of coronary artery disease with prior PCI to LAD in 2015  Currently chest pain free.    H/o PE on xarelto. Resume the same.    Acute Kidney Injury: Resolved with gentle hydration.        DVT prophylaxis: xarelto Code Status: (Full/) Family Communication: none at bedside.  Disposition Plan pending further management.    Consultants:   none   Procedures: none   Antimicrobials:  Vancomycin  Zosyn 6/16  Subjective: Denies any new complaints.   Objective: Filed Vitals:   09/29/15 2238 09/30/15 0226 09/30/15 0508 09/30/15 1314  BP: 110/53  110/52 109/52  Pulse: 120 78 78 67  Temp:   99.4 F (37.4 C) 98.3 F (36.8 C)  TempSrc:   Oral   Resp: 20 18 20 19   Weight:   54.9 kg (121 lb 0.5 oz)   SpO2: 93% 96% 100% 99%    Intake/Output Summary (Last 24 hours) at 09/30/15 1436 Last data filed at 09/30/15 1335  Gross per 24 hour  Intake    460 ml  Output    700 ml  Net   -240 ml   Filed Weights   09/29/15 1815 09/30/15 0508  Weight: 54.9 kg (121 lb 0.5 oz) 54.9 kg (121 lb 0.5 oz)    Examination:  General exam: Appears calm and comfortable  Respiratory system: Clear to auscultation. Respiratory effort normal. Cardiovascular system: S1 & S2 heard, RRR. No JVD, murmurs, rubs, gallops or clicks. No pedal edema. Gastrointestinal system:  Abdomen is nondistended, soft and nontender. No organomegaly or masses felt. Normal bowel sounds heard. Central nervous system: Alert but confused. No focal neurological deficits. Extremities: Symmetric 5 x 5 power. Skin: No rashes, lesions or ulcers    Data Reviewed: I have personally reviewed following labs and imaging studies  CBC:  Recent Labs Lab 09/29/15 1210 09/30/15 0512  WBC 19.2* 18.4*  NEUTROABS 15.6*  --   HGB 9.2* 8.4*  HCT 32.1* 29.6*  MCV 73.6* 72.2*  PLT 309 397   Basic Metabolic Panel:  Recent Labs Lab  09/29/15 1210 09/30/15 0512  NA 136 135  K 4.0 4.0  CL 105 101  CO2 23 25  GLUCOSE 126* 103*  BUN 23* 20  CREATININE 1.61* 1.22  CALCIUM 8.7* 8.8*   GFR: Estimated Creatinine Clearance: 40 mL/min (by C-G formula based on Cr of 1.22). Liver Function Tests:  Recent Labs Lab 09/30/15 0512  AST 19  ALT 37  ALKPHOS 109  BILITOT 1.0  PROT 6.3*  ALBUMIN 2.5*   No results for input(s): LIPASE, AMYLASE in the last 168 hours. No results for input(s): AMMONIA in the last 168 hours. Coagulation Profile: No results for input(s): INR, PROTIME in the last 168 hours. Cardiac Enzymes: No results for input(s): CKTOTAL, CKMB, CKMBINDEX, TROPONINI in the last 168 hours. BNP (last 3 results) No results for input(s): PROBNP in the last 8760 hours. HbA1C: No results for input(s): HGBA1C in the last 72 hours. CBG:  Recent Labs Lab 09/29/15 1209 09/29/15 2354 09/30/15 0754 09/30/15 1158  GLUCAP 116* 128* 120* 180*   Lipid Profile: No results for input(s): CHOL, HDL, LDLCALC, TRIG, CHOLHDL, LDLDIRECT in the last 72 hours. Thyroid Function Tests: No results for input(s): TSH, T4TOTAL, FREET4, T3FREE, THYROIDAB in the last 72 hours. Anemia Panel: No results for input(s): VITAMINB12, FOLATE, FERRITIN, TIBC, IRON, RETICCTPCT in the last 72 hours. Sepsis Labs:  Recent Labs Lab 09/29/15 1900  LATICACIDVEN 1.7    No results found for this or any previous visit (from the past 240 hour(s)).       Radiology Studies: Dg Chest 2 View  09/29/2015  CLINICAL DATA:  Hypoxia EXAM: CHEST  2 VIEW COMPARISON:  09/08/2015 FINDINGS: Cardiac shadow is stable. Defibrillator is again noted. Lungs are well aerated bilaterally. No focal infiltrate or effusion is noted. No bony abnormality is seen. IMPRESSION: No active cardiopulmonary disease. Electronically Signed   By: Inez Catalina M.D.   On: 09/29/2015 13:49   Ct Head Wo Contrast  09/29/2015  CLINICAL DATA:  Possible seizure today. The patient  was unresponsive for 30 seconds. Initial encounter. EXAM: CT HEAD WITHOUT CONTRAST TECHNIQUE: Contiguous axial images were obtained from the base of the skull through the vertex without intravenous contrast. COMPARISON:  Head CT scan 09/08/2015 and 12/21/2014. FINDINGS: Atrophy and extensive chronic microvascular ischemic change are again seen. Bilateral cerebellar infarcts, most conspicuous in the left cerebellar hemisphere and bilateral subcortical infarcts best seen in the right caudate and thalamus are again identified. Anterior and posterior watershed territory infarcts bilaterally are also again identified. No evidence of acute abnormality including hemorrhage, infarct, mass lesion, mass effect, midline shift or abnormal extra-axial fluid collection. No hydrocephalus or pneumocephalus. The calvarium is intact. Imaged paranasal sinuses mastoid air cells are clear. IMPRESSION: No acute abnormality. Extensive atrophy and multiple remote infarctions as seen on prior exams. Electronically Signed   By: Inge Rise M.D.   On: 09/29/2015 13:14   Ct Chest Wo Contrast  09/29/2015  CLINICAL DATA:  75 year old male with a cough for the past 2 days. History of CHF. EXAM: CT CHEST WITHOUT CONTRAST TECHNIQUE: Multidetector CT imaging of the chest was performed following the standard protocol without IV contrast. COMPARISON:  Chest x-ray obtained earlier today ; prior chest CT 04/18/2014 FINDINGS: Mediastinum: Unremarkable CT appearance of the thyroid gland. No suspicious mediastinal or hilar adenopathy. No soft tissue mediastinal mass. The thoracic esophagus is unremarkable. Heart/Vascular: Limited evaluation in the absence of intravenous contrast. Left subclavian approach cardiac rhythm maintenance device with leads terminating in the right atrium and right ventricular apex. The heart is normal in size. No pericardial effusion. Atherosclerotic calcifications present throughout the coronary arteries. No pericardial  effusion. Lungs/Pleura: Stable 2 cm thin walled cystic structure with adjacent pleural parenchymal scarring in the right upper lobe. Mild background centrilobular emphysema. Interval development of patchy ground-glass attenuation airspace opacities in a peribronchovascular distribution in the right greater than left lower lobes. Findings superimposed on a background of chronic scarring. Bones/Soft Tissues: No acute fracture or aggressive appearing lytic or blastic osseous lesion. Upper Abdomen: Multiple hypoechoic lesions throughout both kidneys which are incompletely characterized in the absence of intravenous contrast material. However, prior imaging demonstrated multiple simple cysts. No nephrolithiasis visualized. Upper abdominal organs are unremarkable. IMPRESSION: 1. Findings most consistent with an infectious/ inflammatory process involving the bilateral lower lobes. Given the distribution, aspiration is a consideration as is multilobar pneumonia. 2. Coronary artery calcifications. 3. Bilateral renal cysts. Electronically Signed   By: Jacqulynn Cadet M.D.   On: 09/29/2015 16:00   Portable Chest 1 View  09/30/2015  CLINICAL DATA:  Health care associated pneumonia. EXAM: PORTABLE CHEST 1 VIEW COMPARISON:  Radiograph of September 29, 2015. FINDINGS: The heart size and mediastinal contours are within normal limits. Both lungs are clear. No pneumothorax or pleural effusion is noted. Left-sided pacemaker is unchanged in position. The visualized skeletal structures are unremarkable. IMPRESSION: No acute cardiopulmonary abnormality seen. Electronically Signed   By: Marijo Conception, M.D.   On: 09/30/2015 10:08   Dg Swallowing Func-speech Pathology  09/30/2015  Objective Swallowing Evaluation: Type of Study: MBS-Modified Barium Swallow Study Patient Details Name: MAZIAH KEELING MRN: [id number] Date of Birth: 05/20/1940 Today's Date: 09/30/2015 Time: SLP Start Time (ACUTE ONLY): 1100-SLP Stop Time (ACUTE ONLY): 1120 SLP  Time Calculation (min) (ACUTE ONLY): 20 min Past Medical History: Past Medical History Diagnosis Date . Hypertension  . Dilated cardiomyopathy ([place])    2/12: EF 30-35%, trivial AI, mild RAE.  EF 2014 40 -45% . CAD (coronary artery disease)    LHC 9/05 with Dr. Einar Gip:  dLM 20-30%, LAD 85%, oD1 20-30%.  PCI:  Taxus DES to LAD; Dx jailed and tx with POBA.  Last myoview 12/10: inf scar, no ischemia, EF 29%. . DVT (deep venous thrombosis) ([place])  . Pulmonary embolus (HCC)    chronic coumadin . 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 PFO (patent foramen ovale)    Not mentioned on 2014 echo. [name] HLD (hyperlipidemia)  . Crohn's disease ([place])  . Nephrolithiasis  . Diabetes mellitus    11/05/11 "borderline; don't take medications" . Stroke [facility]) 1993   "left arm can't hold steady; leg too" . Arthritis    "used to have a touch in my legs" . Memory difficulties  . Swelling of both ankles     09-22-13 occ.feet, but denies pain. . Bradycardia 06/01/2014 . Dementia  . Hyperlipidemia  . CHF (congestive heart failure) ([place])  . Pneumonia ~ 2011   09-22-13  denies any recent SOB or breathing problems . HCAP (healthcare-associated pneumonia) 09/29/2015 . AICD (automatic cardioverter/defibrillator) present  Past Surgical History: Past Surgical History Procedure Laterality Date . Colon surgery  1994; 1996   "for Crohn's disease" . Appendectomy   . Implantable cardioverter defibrillator implant N/A 06/02/2014   Procedure: IMPLANTABLE CARDIOVERTER DEFIBRILLATOR IMPLANT;  Surgeon: [name], MD;  Location: [facility] CATH LAB;  Service: Cardiovascular;  Laterality: N/A; HPI: Shayan Bramhall Debono is a 75 y.o. male who was recently discharged presents via [facility] from [facility] for possible syncopal episode vs seizure. Pt was at PT when he had a 30 second episode of "unresponsiveness" with a right sided gaze. Pt was sitting, no fall or head injury. After episode pt had generalized bilateral weakness. No history of seizure known. Pt responsive to verbal  stimuli. GCS reported 13. Follows commands. EMS reported rhonchi in lower bases with nonproductive cough. Oxygen sat in 80s RA on arrival. He has a PMH of dementia, hypertension, hyperlipidemia, diabetes mellitus, depression, CAD, DVT, PE on Xarelto, PFO, chronic disease, stroke, BPH, systolic congestive heart failure (EF 20-25%), who presents with generalized weakness and increased cough, chest congestion and shortness of breath.The patient was evaluated in the ED and found to have an acute leukocytosis, hypoxia and abnormal lung sounds. A CT of the chest was obtained that was significant for multifocal bilateral pneumonia. No Data Recorded Assessment / Plan / Recommendation CHL IP CLINICAL IMPRESSIONS 09/30/2015 Therapy Diagnosis Mild pharyngeal phase dysphagia Clinical Impression Pt demonstrates mild oropharyngeal dysphagia characterized by delayed swallow initiation with consistent flash penetration of liquids. Laryngeal closure is adequate and airway remained protected throughout assessment despite large consecutive swallows. Recommend pt consume a regular diet and thin liquids. No SLP f/u needed, will sign off.  Impact on safety and function Mild aspiration risk   CHL IP TREATMENT RECOMMENDATION 09/30/2015 Treatment Recommendations No treatment recommended at this time   No flowsheet data found. CHL IP DIET RECOMMENDATION 09/30/2015 SLP Diet Recommendations Regular solids;Thin liquid Liquid Administration via Cup;Straw Medication Administration Whole meds with liquid Compensations Slow rate;Small sips/bites Postural Changes Seated upright at 90 degrees   CHL IP OTHER RECOMMENDATIONS 09/30/2015 Recommended Consults -- Oral Care Recommendations Oral care BID Other Recommendations --   CHL IP FOLLOW UP RECOMMENDATIONS 09/30/2015 Follow up Recommendations None   CHL IP FREQUENCY AND DURATION 04/18/2014 Speech Therapy Frequency (ACUTE ONLY) min 2x/week Treatment Duration --      CHL IP ORAL PHASE 09/30/2015 Oral  Phase WFL Oral - Pudding Teaspoon -- Oral - Pudding Cup -- Oral - Honey Teaspoon -- Oral - Honey Cup -- Oral - Nectar Teaspoon -- Oral - Nectar Cup -- Oral - Nectar Straw -- Oral - Thin Teaspoon -- Oral - Thin Cup -- Oral - Thin Straw -- Oral - Puree -- Oral - Mech Soft -- Oral - Regular -- Oral - Multi-Consistency -- Oral - Pill -- Oral Phase - Comment --  CHL IP PHARYNGEAL PHASE 09/30/2015 Pharyngeal Phase Impaired Pharyngeal- Pudding Teaspoon -- Pharyngeal -- Pharyngeal- Pudding Cup -- Pharyngeal -- Pharyngeal- Honey Teaspoon -- Pharyngeal -- Pharyngeal- Honey Cup -- Pharyngeal -- Pharyngeal- Nectar Teaspoon -- Pharyngeal -- Pharyngeal- Nectar Cup -- Pharyngeal -- Pharyngeal- Nectar Straw -- Pharyngeal -- Pharyngeal- Thin Teaspoon -- Pharyngeal -- Pharyngeal- Thin Cup Delayed swallow initiation-pyriform sinuses;Penetration/Aspiration before swallow;Penetration/Aspiration during swallow Pharyngeal Material enters airway, remains ABOVE vocal cords then ejected out Pharyngeal- Thin Straw Delayed swallow initiation-pyriform sinuses;Penetration/Aspiration before swallow;Penetration/Aspiration during swallow Pharyngeal Material enters airway, remains  ABOVE vocal cords then ejected out Pharyngeal- Puree Delayed swallow initiation-pyriform sinuses Pharyngeal Material does not enter airway Pharyngeal- Mechanical Soft -- Pharyngeal -- Pharyngeal- Regular Delayed swallow initiation-pyriform sinuses Pharyngeal -- Pharyngeal- Multi-consistency -- Pharyngeal -- Pharyngeal- Pill Delayed swallow initiation-pyriform sinuses;Penetration/Aspiration before swallow;Penetration/Aspiration during swallow Pharyngeal Material enters airway, remains ABOVE vocal cords then ejected out Pharyngeal Comment --  No flowsheet data found. No flowsheet data found. Herbie [place], [place] CCC-SLP [phone] Lynann Beaver 09/30/2015, 12:49 PM                   Scheduled Meds: . aspirin EC  81 mg Oral Daily  . atorvastatin  10 mg Oral  q1800  . donepezil  10 mg Oral QHS  . feeding supplement (ENSURE ENLIVE)  237 mL Oral BID BM  . insulin aspart  0-9 Units Subcutaneous TID WC  . latanoprost  1 drop Both Eyes QHS  . piperacillin-tazobactam (ZOSYN)  IV  3.375 g Intravenous Q8H  . rivaroxaban  20 mg Oral Q supper  . senna  1 tablet Oral q morning - 10a  . sertraline  50 mg Oral [id number]  . sodium chloride flush  3 mL Intravenous Q12H  . tamsulosin  0.4 mg Oral Daily  . vancomycin  750 mg Intravenous Q24H   Continuous Infusions:    LOS: 1 day    Time spent: 25 minutes.     Hosie Poisson, MD [facility] Pager [phone] If 7PM-7AM, please contact night-coverage [email or web address] Password [facility] 09/30/2015, 2:36 PM

## 2015-10-01 DIAGNOSIS — I2510 Atherosclerotic heart disease of native coronary artery without angina pectoris: Secondary | ICD-10-CM

## 2015-10-01 LAB — GLUCOSE, CAPILLARY
GLUCOSE-CAPILLARY: 105 mg/dL — AB (ref 65–99)
GLUCOSE-CAPILLARY: 223 mg/dL — AB (ref 65–99)
GLUCOSE-CAPILLARY: 164 mg/dL — AB (ref 65–99)
GLUCOSE-CAPILLARY: 138 mg/dL — AB (ref 65–99)

## 2015-10-01 NOTE — Evaluation (Signed)
Occupational Therapy Evaluation Patient Details Name: Grant Lara MRN: [id number] DOB: 06/26/1940 Today's Date: 10/01/2015    History of Present Illness Grant Lara is a 75 y.o. male who was recently discharged presents via [facility] from [facility] for possible syncopal episode vs seizure. Pt was at PT when he had a 30 second episode of "unresponsiveness" with a right sided gaze. Pt was sitting, no fall or head injury. After episode pt had generalized bilateral weakness. No history of seizure known. Pt responsive to verbal stimuli. GCS reported 13. Follows commands. EMS reported rhonchi in lower bases with nonproductive cough. Oxygen sat in 80s RA on arrival. He has a PMH of dementia, hypertension, hyperlipidemia, diabetes mellitus, depression, CAD, DVT, PE on Xarelto, PFO, chronic disease, stroke, BPH, systolic congestive heart failure (EF 20-25%), who presents with generalized weakness and increased cough, chest congestion and shortness of breath.The patient was evaluated in the ED and found to have an acute leukocytosis, hypoxia and abnormal lung sounds. A CT of the chest was obtained that was significant for multifocal bilateral pneumonia.    Clinical Impression   Pt admitted with above. He demonstrates the below listed deficits and will benefit from continued OT to maximize safety and independence with BADLs.  Pt presents to OT with generalized weakness, impaired balance, and cognitive deficits.  He requires mod - max A for ADLs and min A for functional transfers.  Recommend return to SNF.  Will follow acutely.       Follow Up Recommendations   SNF    Equipment Recommendations    None   Recommendations for Other Services  None     Precautions / Restrictions Precautions Precautions: Fall      Mobility Bed Mobility Overal bed mobility: Needs Assistance Bed Mobility: Supine to Sit     Supine to sit: Min assist     General bed mobility comments: Pt  is easily distracted and requires assist for attention, sequencing and problem solving to scoot hips to EOB   Transfers Overall transfer level: Needs assistance Equipment used: Rolling walker (2 wheeled) Transfers: Sit to/from [facility] Sit to Stand: Min assist Stand pivot transfers: Min assist       General transfer comment: Min A to rise and assist for balance and to maneuver RW     Balance Overall balance assessment: Needs assistance Sitting-balance support: Feet supported Sitting balance-Leahy Scale: Fair     Standing balance support: Bilateral upper extremity supported Standing balance-Leahy Scale: Poor                              ADL Overall ADL's : Needs assistance/impaired Eating/Feeding: Set up;Sitting   Grooming: Wash/dry hands;Wash/dry face;Set up;Supervision/safety;Sitting   Upper Body Bathing: Moderate assistance;Sitting   Lower Body Bathing: Maximal assistance;Sit to/from stand   Upper Body Dressing : Maximal assistance;Sitting   Lower Body Dressing: Total assistance;Sit to/from stand Lower Body Dressing Details (indicate cue type and reason): Pt unable to access feet  Toilet Transfer: Minimal assistance;Stand-pivot;BSC;RW Toilet Transfer Details (indicate cue type and reason): Min A to move into standing and for balance  Toileting- Clothing Manipulation and Hygiene: Total assistance;Sit to/from stand       Functional mobility during ADLs: Minimal assistance;Rolling walker       Vision     Perception     Praxis      Pertinent Vitals/Pain Pain Assessment: Faces Faces Pain Scale: No hurt  Hand Dominance Right   Extremity/Trunk Assessment Upper Extremity Assessment Upper Extremity Assessment: Generalized weakness   Lower Extremity Assessment Lower Extremity Assessment: Defer to PT evaluation   Cervical / Trunk Assessment Cervical / Trunk Assessment: Kyphotic   Communication  Communication Communication: Expressive difficulties   Cognition Arousal/Alertness: Awake/alert Behavior During Therapy: Flat affect Overall Cognitive Status: History of cognitive impairments - at baseline                     General Comments       Exercises       Shoulder Instructions      Home Living Family/patient expects to be discharged to:: Skilled nursing facility                                        Prior Functioning/Environment Level of Independence: Needs assistance  Gait / Transfers Assistance Needed: A by staff at SNF with RW ADL's / Homemaking Assistance Needed: A by staff at SNF   Comments: Pt was receiving therapy at SNF prior to admit    OT Diagnosis:     OT Problem List:     OT Treatment/Interventions:      OT Goals(Current goals can be found in the care plan section) ADL Goals Pt Will Perform Grooming: with min assist;standing Pt Will Perform Upper Body Bathing: with min assist;sitting Pt Will Perform Lower Body Bathing: with mod assist;sit to/from stand Pt Will Transfer to Toilet: ambulating;regular height toilet;bedside commode;grab bars;with min assist Pt Will Perform Toileting - Clothing Manipulation and hygiene: with mod assist;sit to/from stand  OT Frequency:     Barriers to D/C:            Co-evaluation              End of Session    Activity Tolerance:   Patient left:     Time:  -    Charges:    G-CodesLucille Passy M 10/03/15, 11:54 AM

## 2015-10-01 NOTE — Progress Notes (Signed)
PROGRESS NOTE                                                                                                                                                                                                             Patient Demographics:    Grant Lara, is a 75 y.o. male, DOB - May 31, 1940, ZRA:[id number]  Admit date - 09/29/2015   Admitting Physician Murlean Iba, MD  Outpatient Primary MD for the patient is [name], MD  LOS - 2  Chief Complaint  Patient presents with  . Loss of Consciousness       Brief Narrative   75 y.o. male with PMH of dementia, hypertension, hyperlipidemia, diabetes mellitus, depression, CAD, DVT, PE on Xarelto, PFO, chronic disease, stroke, BPH, systolic congestive heart failure (EF 20-25%), recently discharged for anemia workup, presents with generalized weakness and presyncope, workup significant for multilobar pneumonia .   Subjective:    Grant Lara today has, No headache, No chest pain, No abdominal pain - Complaints of cough and generalized weakness.  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)   Dilated cardiomyopathy (HCC)   Chronic systolic heart failure (HCC)   COPD (chronic obstructive pulmonary disease) (HCC)   History of embolic stroke   Crohn's disease ([place])   Hyperglycemia   DM2 (diabetes mellitus, type 2) (HCC)   Hx pulmonary embolism   Leukocytosis   Anemia, chronic disease   Aphasia   CKD (chronic kidney disease) stage 3, GFR 30-59 ml/min   Coronary artery disease involving native coronary artery of native heart without angina pectoris   Late effects of CVA (cerebrovascular accident)   Cardiac syncope   Chronic systolic CHF (congestive heart failure) (HCC)   Generalized weakness   Protein-calorie malnutrition, severe (HCC)  Vascular dementia without behavioral disturbance   ICD (implantable cardioverter-defibrillator) in place   Hypoxia   HCAP - CT chest significant for bilateral lower lobe pneumonia - seen by SLP, MBS on 09/30/2015 showing only mild dysphagia, condition for regular diet with thin liquid - Remains febrile, fever 100.4 yesterday evening, still with significant leukocytosis, but trending down - Continue with IV vac and Zosyn - follow on sputum cultures, will send blood cultures  Advanced dementia - Continue denepezil   Diabetes mellitus type 2 -Hemoglobin A1c 6.8, continue sliding scale insulin   Dilated cardiomyopathy with an EF of 20-25% based on 2-D echo in February 2016 History of coronary artery disease with prior PCI to LAD in 2015 - Denies any chest pain or shortness of breath - Continue with aspirin and statin, appears euvolemic  History of pulmonary embolism - on Xarelto  AKI - Resolved with gentle hydration  Code Status : Full  Family Communication  : none at bedside  Disposition Plan  : PT Recommending sniff  Consults  :  none  Procedures  : none  DVT Prophylaxis  : Xarelto  Lab Results  Component Value Date   PLT 332 09/30/2015    Antibiotics  :   Anti-infectives    Start     Dose/Rate Route Frequency Ordered Stop   09/30/15 1700  vancomycin (VANCOCIN) IVPB 750 mg/150 ml premix     750 mg 150 mL/hr over 60 Minutes Intravenous Every 24 hours 09/29/15 1852     09/29/15 1850  piperacillin-tazobactam (ZOSYN) IVPB 3.375 g     3.375 g 12.5 mL/hr over 240 Minutes Intravenous Every 8 hours 09/29/15 1852     09/29/15 1615  vancomycin (VANCOCIN) IVPB 1000 mg/200 mL premix     1,000 mg 200 mL/hr over 60 Minutes Intravenous  Once 09/29/15 1606 09/29/15 1808   09/29/15 1615  piperacillin-tazobactam (ZOSYN) IVPB 3.375 g     3.375 g 100 mL/hr over 30 Minutes Intravenous  Once 09/29/15 1606 09/29/15 1708        Objective:   Filed Vitals:   09/30/15 2124  10/01/15 0431 10/01/15 0635 10/01/15 1336  BP: 108/58  113/54 108/59  Pulse: 68  62 89  Temp: 100.4 F (38 C)  99.4 F (37.4 C) 98.4 F (36.9 C)  TempSrc: Oral  Oral   Resp: 18  19 19   Weight:  55 kg (121 lb 4.1 oz)    SpO2: 100%  100% 100%    Wt Readings from Last 3 Encounters:  10/01/15 55 kg (121 lb 4.1 oz)  09/13/15 54.885 kg (121 lb)  09/12/15 54.885 kg (121 lb)     Intake/Output Summary (Last 24 hours) at 10/01/15 1633 Last data filed at 10/01/15 1309  Gross per 24 hour  Intake   4410 ml  Output    300 ml  Net   4110 ml     Physical Exam  Awake Alert, Oriented X 3, Supple Neck,No JVD,  Symmetrical Chest wall movement, Good air movement bilaterally, No wheezing RRR,No Gallops,Rubs or new Murmurs, No Parasternal Heave +ve B.Sounds, Abd Soft, No tenderness,  No rebound - guarding or rigidity. No Cyanosis, Clubbing or edema, No new Rash or bruise      Data Review:    CBC  Recent Labs Lab 09/29/15 1210 09/30/15 0512  WBC 19.2* 18.4*  HGB 9.2* 8.4*  HCT 32.1* 29.6*  PLT 309 332  MCV 73.6* 72.2*  MCH 21.1* 20.5*  MCHC 28.7* 28.4*  RDW 20.9* 21.1*  LYMPHSABS 1.5  --   MONOABS 2.1*  --   EOSABS 0.0  --   BASOSABS 0.0  --     Chemistries   Recent Labs Lab 09/29/15 1210 09/30/15 0512  NA 136 135  K 4.0 4.0  CL 105 101  CO2 23 25  GLUCOSE 126* 103*  BUN 23* 20  CREATININE 1.61* 1.22  CALCIUM 8.7* 8.8*  AST  --  19  ALT  --  37  ALKPHOS  --  109  BILITOT  --  1.0   ------------------------------------------------------------------------------------------------------------------ No results for input(s): CHOL, HDL, LDLCALC, TRIG, CHOLHDL, LDLDIRECT in the last 72 hours.  Lab Results  Component Value Date   HGBA1C 6.8* 09/08/2015   ------------------------------------------------------------------------------------------------------------------ No results for input(s): TSH, T4TOTAL, T3FREE, THYROIDAB in the last 72 hours.  Invalid  input(s): FREET3 ------------------------------------------------------------------------------------------------------------------ No results for input(s): VITAMINB12, FOLATE, FERRITIN, TIBC, IRON, RETICCTPCT in the last 72 hours.  Coagulation profile No results for input(s): INR, PROTIME in the last 168 hours.  No results for input(s): DDIMER in the last 72 hours.  Cardiac Enzymes No results for input(s): CKMB, TROPONINI, MYOGLOBIN in the last 168 hours.  Invalid input(s): CK ------------------------------------------------------------------------------------------------------------------    Component Value Date/Time   BNP 88.7 09/29/2015 1210    Inpatient Medications  Scheduled Meds: . aspirin EC  81 mg Oral Daily  . atorvastatin  10 mg Oral q1800  . donepezil  10 mg Oral QHS  . feeding supplement (ENSURE ENLIVE)  237 mL Oral BID BM  . insulin aspart  0-9 Units Subcutaneous TID WC  . latanoprost  1 drop Both Eyes QHS  . piperacillin-tazobactam (ZOSYN)  IV  3.375 g Intravenous Q8H  . rivaroxaban  20 mg Oral Q supper  . senna  1 tablet Oral q morning - 10a  . sertraline  50 mg Oral BH-q7a  . sodium chloride flush  3 mL Intravenous Q12H  . tamsulosin  0.4 mg Oral Daily  . vancomycin  750 mg Intravenous Q24H   Continuous Infusions:  PRN Meds:.sodium chloride, acetaminophen **OR** acetaminophen, ALPRAZolam, levalbuterol, sodium chloride flush  Micro Results No results found for this or any previous visit (from the past 240 hour(s)).  Radiology Reports Dg Chest 2 View  09/29/2015  CLINICAL DATA:  Hypoxia EXAM: CHEST  2 VIEW COMPARISON:  09/08/2015 FINDINGS: Cardiac shadow is stable. Defibrillator is again noted. Lungs are well aerated bilaterally. No focal infiltrate or effusion is noted. No bony abnormality is seen. IMPRESSION: No active cardiopulmonary disease. Electronically Signed   By: Inez Catalina M.D.   On: 09/29/2015 13:49   Dg Chest 2 View  09/08/2015  CLINICAL  DATA:  Shortness of breath and weakness. EXAM: CHEST  2 VIEW COMPARISON:  12/21/2014 FINDINGS: Left-sided pacemaker unchanged. Lungs are hyperexpanded without focal consolidation or effusion. Cardiomediastinal silhouette is within normal. Remainder of the exam is unchanged. IMPRESSION: No acute cardiopulmonary disease. COPD. Electronically Signed   By: Marin Olp M.D.   On: 09/08/2015 11:20   Abd 1 View (kub)  09/08/2015  CLINICAL DATA:  75 year old male with diabetes, high blood pressure and hyperlipidemia presenting with generalized weakness and increased urinary frequency. On witnessed fall. Initial encounter. EXAM: ABDOMEN - 1 VIEW COMPARISON:  05/02/2014 and 07/16/2013. FINDINGS: No evidence of bowel obstruction. The possibility of free intraperitoneal air cannot be assessed on a supine view. No acute fracture noted. Vascular calcifications. IMPRESSION: No evidence of bowel obstruction. No acute fracture noted. Electronically Signed   By: Genia Del M.D.   On: 09/08/2015 23:37   Ct Head Wo Contrast  09/29/2015  CLINICAL DATA:  Possible seizure today. The patient was unresponsive for 30 seconds. Initial encounter. EXAM: CT HEAD WITHOUT CONTRAST TECHNIQUE: Contiguous axial images were obtained from the base of the skull through the vertex without intravenous contrast. COMPARISON:  Head CT scan 09/08/2015 and 12/21/2014. FINDINGS: Atrophy and extensive chronic microvascular ischemic change are again seen. Bilateral  cerebellar infarcts, most conspicuous in the left cerebellar hemisphere and bilateral subcortical infarcts best seen in the right caudate and thalamus are again identified. Anterior and posterior watershed territory infarcts bilaterally are also again identified. No evidence of acute abnormality including hemorrhage, infarct, mass lesion, mass effect, midline shift or abnormal extra-axial fluid collection. No hydrocephalus or pneumocephalus. The calvarium is intact. Imaged paranasal sinuses  mastoid air cells are clear. IMPRESSION: No acute abnormality. Extensive atrophy and multiple remote infarctions as seen on prior exams. Electronically Signed   By: Inge Rise M.D.   On: 09/29/2015 13:14   Ct Head Wo Contrast  09/08/2015  CLINICAL DATA:  Unwitnessed fall at nursing home. Initial encounter. EXAM: CT HEAD WITHOUT CONTRAST CT CERVICAL SPINE WITHOUT CONTRAST TECHNIQUE: Multidetector CT imaging of the head and cervical spine was performed following the standard protocol without intravenous contrast. Multiplanar CT image reconstructions of the cervical spine were also generated. COMPARISON:  Head CT 12/21/2014 FINDINGS: CT HEAD FINDINGS Skull and Sinuses:Negative for fracture or hemo sinus. Benign ground-glass density in the left occipital bone that is small and stable. Visualized orbits: Gaze to the right, nonspecific. No evidence of orbital injury. Brain: No evidence of acute infarction, hemorrhage, hydrocephalus, or mass lesion/mass effect. There is advanced chronic ischemic injury with numerous bilateral cerebellar infarcts, largest area in the inferior left cerebellum. Multiple bilateral subcortical infarcts, most discrete in the right thalamus and caudate head. Extensive fairly symmetric cortical infarct and atrophy, aligned in a watershed distribution. CT CERVICAL SPINE FINDINGS Negative for acute fracture or subluxation. No prevertebral edema. No gross cervical canal hematoma. Diffuse disc degeneration and spondylotic spurring. No indication of high-grade canal stenosis. IMPRESSION: 1. No evidence of acute intracranial or cervical spine injury. 2. Extensive chronic ischemic injury as described, stable from 12/21/2014 comparison. Electronically Signed   By: Monte Fantasia M.D.   On: 09/08/2015 11:04   Ct Chest Wo Contrast  09/29/2015  CLINICAL DATA:  75 year old male with a cough for the past 2 days. History of CHF. EXAM: CT CHEST WITHOUT CONTRAST TECHNIQUE: Multidetector CT imaging of  the chest was performed following the standard protocol without IV contrast. COMPARISON:  Chest x-ray obtained earlier today ; prior chest CT 04/18/2014 FINDINGS: Mediastinum: Unremarkable CT appearance of the thyroid gland. No suspicious mediastinal or hilar adenopathy. No soft tissue mediastinal mass. The thoracic esophagus is unremarkable. Heart/Vascular: Limited evaluation in the absence of intravenous contrast. Left subclavian approach cardiac rhythm maintenance device with leads terminating in the right atrium and right ventricular apex. The heart is normal in size. No pericardial effusion. Atherosclerotic calcifications present throughout the coronary arteries. No pericardial effusion. Lungs/Pleura: Stable 2 cm thin walled cystic structure with adjacent pleural parenchymal scarring in the right upper lobe. Mild background centrilobular emphysema. Interval development of patchy ground-glass attenuation airspace opacities in a peribronchovascular distribution in the right greater than left lower lobes. Findings superimposed on a background of chronic scarring. Bones/Soft Tissues: No acute fracture or aggressive appearing lytic or blastic osseous lesion. Upper Abdomen: Multiple hypoechoic lesions throughout both kidneys which are incompletely characterized in the absence of intravenous contrast material. However, prior imaging demonstrated multiple simple cysts. No nephrolithiasis visualized. Upper abdominal organs are unremarkable. IMPRESSION: 1. Findings most consistent with an infectious/ inflammatory process involving the bilateral lower lobes. Given the distribution, aspiration is a consideration as is multilobar pneumonia. 2. Coronary artery calcifications. 3. Bilateral renal cysts. Electronically Signed   By: Jacqulynn Cadet M.D.   On: 09/29/2015 16:00   Ct  Cervical Spine Wo Contrast  09/08/2015  CLINICAL DATA:  Unwitnessed fall at nursing home. Initial encounter. EXAM: CT HEAD WITHOUT CONTRAST CT  CERVICAL SPINE WITHOUT CONTRAST TECHNIQUE: Multidetector CT imaging of the head and cervical spine was performed following the standard protocol without intravenous contrast. Multiplanar CT image reconstructions of the cervical spine were also generated. COMPARISON:  Head CT 12/21/2014 FINDINGS: CT HEAD FINDINGS Skull and Sinuses:Negative for fracture or hemo sinus. Benign ground-glass density in the left occipital bone that is small and stable. Visualized orbits: Gaze to the right, nonspecific. No evidence of orbital injury. Brain: No evidence of acute infarction, hemorrhage, hydrocephalus, or mass lesion/mass effect. There is advanced chronic ischemic injury with numerous bilateral cerebellar infarcts, largest area in the inferior left cerebellum. Multiple bilateral subcortical infarcts, most discrete in the right thalamus and caudate head. Extensive fairly symmetric cortical infarct and atrophy, aligned in a watershed distribution. CT CERVICAL SPINE FINDINGS Negative for acute fracture or subluxation. No prevertebral edema. No gross cervical canal hematoma. Diffuse disc degeneration and spondylotic spurring. No indication of high-grade canal stenosis. IMPRESSION: 1. No evidence of acute intracranial or cervical spine injury. 2. Extensive chronic ischemic injury as described, stable from 12/21/2014 comparison. Electronically Signed   By: Monte Fantasia M.D.   On: 09/08/2015 11:04   Portable Chest 1 View  09/30/2015  CLINICAL DATA:  Health care associated pneumonia. EXAM: PORTABLE CHEST 1 VIEW COMPARISON:  Radiograph of September 29, 2015. FINDINGS: The heart size and mediastinal contours are within normal limits. Both lungs are clear. No pneumothorax or pleural effusion is noted. Left-sided pacemaker is unchanged in position. The visualized skeletal structures are unremarkable. IMPRESSION: No acute cardiopulmonary abnormality seen. Electronically Signed   By: Marijo Conception, M.D.   On: 09/30/2015 10:08   Dg  Swallowing Func-speech Pathology  09/30/2015  Objective Swallowing Evaluation: Type of Study: MBS-Modified Barium Swallow Study Patient Details Name: Grant Lara MRN: [id number] Date of Birth: May 29, 1940 Today's Date: 09/30/2015 Time: SLP Start Time (ACUTE ONLY): 1100-SLP Stop Time (ACUTE ONLY): 1120 SLP Time Calculation (min) (ACUTE ONLY): 20 min Past Medical History: Past Medical History Diagnosis Date . Hypertension  . Dilated cardiomyopathy ([place])    2/12: EF 30-35%, trivial AI, mild RAE.  EF 2014 40 -45% . CAD (coronary artery disease)    LHC 9/05 with Dr. Einar Gip:  dLM 20-30%, LAD 85%, oD1 20-30%.  PCI:  Taxus DES to LAD; Dx jailed and tx with POBA.  Last myoview 12/10: inf scar, no ischemia, EF 29%. . DVT (deep venous thrombosis) ([place])  . Pulmonary embolus (HCC)    chronic coumadin . 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 PFO (patent foramen ovale)    Not mentioned on 2014 echo. [name] HLD (hyperlipidemia)  . Crohn's disease ([place])  . Nephrolithiasis  . Diabetes mellitus    11/05/11 "borderline; don't take medications" . Stroke [facility]) 1993   "left arm can't hold steady; leg too" . Arthritis    "used to have a touch in my legs" . Memory difficulties  . Swelling of both ankles     09-22-13 occ.feet, but denies pain. . Bradycardia 06/01/2014 . Dementia  . Hyperlipidemia  . CHF (congestive heart failure) ([place])  . Pneumonia ~ 2011   09-22-13 denies any recent SOB or breathing problems . HCAP (healthcare-associated pneumonia) 09/29/2015 . AICD (automatic cardioverter/defibrillator) present  Past Surgical History: Past Surgical History Procedure Laterality Date . Colon surgery  1994; 1996   "for Crohn's disease" . Appendectomy   . Implantable cardioverter  defibrillator implant N/A 06/02/2014   Procedure: IMPLANTABLE CARDIOVERTER DEFIBRILLATOR IMPLANT;  Surgeon: [name], MD;  Location[facility] CATH LAB;  Service: Cardiovascular;  Laterality: N/A; HPI: Grant Lara is a 75 y.o. male who was recently discharged presents via [facility] from [facility] for possible syncopal episode vs seizure. Pt was at PT when he had a 30 second episode of "unresponsiveness" with a right sided gaze. Pt was sitting, no fall or head injury. After episode pt had generalized bilateral weakness. No history of seizure known. Pt responsive to verbal stimuli. GCS reported 13. Follows commands. EMS reported rhonchi in lower bases with nonproductive cough. Oxygen sat in 80s RA on arrival. He has a PMH of dementia, hypertension, hyperlipidemia, diabetes mellitus, depression, CAD, DVT, PE on Xarelto, PFO, chronic disease, stroke, BPH, systolic congestive heart failure (EF 20-25%), who presents with generalized weakness and increased cough, chest congestion and shortness of breath.The patient was evaluated in the ED and found to have an acute leukocytosis, hypoxia and abnormal lung sounds. A CT of the chest was obtained that was significant for multifocal bilateral pneumonia. No Data Recorded Assessment / Plan / Recommendation CHL IP CLINICAL IMPRESSIONS 09/30/2015 Therapy Diagnosis Mild pharyngeal phase dysphagia Clinical Impression Pt demonstrates mild oropharyngeal dysphagia characterized by delayed swallow initiation with consistent flash penetration of liquids. Laryngeal closure is adequate and airway remained protected throughout assessment despite large consecutive swallows. Recommend pt consume a regular diet and thin liquids. No SLP f/u needed, will sign off.  Impact on safety and function Mild aspiration risk   CHL IP TREATMENT RECOMMENDATION 09/30/2015 Treatment Recommendations No treatment recommended at this time   No flowsheet data found. CHL IP DIET RECOMMENDATION 09/30/2015 SLP Diet Recommendations Regular solids;Thin liquid Liquid Administration via Cup;Straw Medication Administration Whole meds with liquid Compensations Slow rate;Small sips/bites Postural Changes Seated upright at 90 degrees   CHL IP OTHER RECOMMENDATIONS 09/30/2015 Recommended  Consults -- Oral Care Recommendations Oral care BID Other Recommendations --   CHL IP FOLLOW UP RECOMMENDATIONS 09/30/2015 Follow up Recommendations None   CHL IP FREQUENCY AND DURATION 04/18/2014 Speech Therapy Frequency (ACUTE ONLY) min 2x/week Treatment Duration --      CHL IP ORAL PHASE 09/30/2015 Oral Phase WFL Oral - Pudding Teaspoon -- Oral - Pudding Cup -- Oral - Honey Teaspoon -- Oral - Honey Cup -- Oral - Nectar Teaspoon -- Oral - Nectar Cup -- Oral - Nectar Straw -- Oral - Thin Teaspoon -- Oral - Thin Cup -- Oral - Thin Straw -- Oral - Puree -- Oral - Mech Soft -- Oral - Regular -- Oral - Multi-Consistency -- Oral - Pill -- Oral Phase - Comment --  CHL IP PHARYNGEAL PHASE 09/30/2015 Pharyngeal Phase Impaired Pharyngeal- Pudding Teaspoon -- Pharyngeal -- Pharyngeal- Pudding Cup -- Pharyngeal -- Pharyngeal- Honey Teaspoon -- Pharyngeal -- Pharyngeal- Honey Cup -- Pharyngeal -- Pharyngeal- Nectar Teaspoon -- Pharyngeal -- Pharyngeal- Nectar Cup -- Pharyngeal -- Pharyngeal- Nectar Straw -- Pharyngeal -- Pharyngeal- Thin Teaspoon -- Pharyngeal -- Pharyngeal- Thin Cup Delayed swallow initiation-pyriform sinuses;Penetration/Aspiration before swallow;Penetration/Aspiration during swallow Pharyngeal Material enters airway, remains ABOVE vocal cords then ejected out Pharyngeal- Thin Straw Delayed swallow initiation-pyriform sinuses;Penetration/Aspiration before swallow;Penetration/Aspiration during swallow Pharyngeal Material enters airway, remains ABOVE vocal cords then ejected out Pharyngeal- Puree Delayed swallow initiation-pyriform sinuses Pharyngeal Material does not enter airway Pharyngeal- Mechanical Soft -- Pharyngeal -- Pharyngeal- Regular Delayed swallow initiation-pyriform sinuses Pharyngeal -- Pharyngeal- Multi-consistency -- Pharyngeal -- Pharyngeal- Pill Delayed swallow initiation-pyriform sinuses;Penetration/Aspiration before swallow;Penetration/Aspiration  during swallow Pharyngeal Material enters airway,  remains ABOVE vocal cords then ejected out Pharyngeal Comment --  No flowsheet data found. No flowsheet data found. Herbie [place], [place] CCC-SLP [phone] Grant Lara 09/30/2015, 12:49 PM               Time Spent in minutes  25 minutes   Damone Fancher M.D on 10/01/2015 at 4:33 PM  Between 7am to 7pm - Pager - [phone]  After 7pm go to [email or web address] - password [facility]  [facility] -  Office  [phone]

## 2015-10-02 LAB — BASIC METABOLIC PANEL
Anion gap: 7 (ref 5–15)
BUN: 18 mg/dL (ref 6–20)
CALCIUM: 8.8 mg/dL — AB (ref 8.9–10.3)
CO2: 26 mmol/L (ref 22–32)
CREATININE: 1.1 mg/dL (ref 0.61–1.24)
Chloride: 104 mmol/L (ref 101–111)
GFR calc Af Amer: >60 mL/min (ref >60)
Glucose, Bld: 114 mg/dL — ABNORMAL HIGH (ref 65–99)
POTASSIUM: 4.3 mmol/L (ref 3.5–5.1)
SODIUM: 137 mmol/L (ref 135–145)

## 2015-10-02 LAB — GLUCOSE, CAPILLARY
GLUCOSE-CAPILLARY: 107 mg/dL — AB (ref 65–99)
Glucose-Capillary: 114 mg/dL — ABNORMAL HIGH (ref 65–99)
Glucose-Capillary: 287 mg/dL — ABNORMAL HIGH (ref 65–99)
Glucose-Capillary: 242 mg/dL — ABNORMAL HIGH (ref 65–99)

## 2015-10-02 LAB — CBC
HCT: 29.2 % — ABNORMAL LOW (ref 39.0–52.0)
Hemoglobin: 8.2 g/dL — ABNORMAL LOW (ref 13.0–17.0)
MCH: 20.4 pg — AB (ref 26.0–34.0)
MCHC: 28.1 g/dL — AB (ref 30.0–36.0)
MCV: 72.6 fL — ABNORMAL LOW (ref 78.0–100.0)
PLATELETS: 371 10*3/uL (ref 150–400)
RBC: 4.02 MIL/uL — AB (ref 4.22–5.81)
RDW: 21 % — ABNORMAL HIGH (ref 11.5–15.5)
WBC: 16.1 10*3/uL — ABNORMAL HIGH (ref 4.0–10.5)

## 2015-10-02 NOTE — Progress Notes (Signed)
PROGRESS NOTE                                                                                                                                                                                                             Patient Demographics:    Grant Lara, is a 75 y.o. male, DOB - 09/15/1940, XBW:[id number]  Admit date - 09/29/2015   Admitting Physician Murlean Iba, MD  Outpatient Primary MD for the patient is [name], MD  LOS - 3  Chief Complaint  Patient presents with  . Loss of Consciousness       Brief Narrative   75 y.o. male with PMH of dementia, hypertension, hyperlipidemia, diabetes mellitus, depression, CAD, DVT, PE on Xarelto, PFO, chronic disease, stroke, BPH, systolic congestive heart failure (EF 20-25%), recently discharged for anemia workup, presents with generalized weakness and presyncope, workup significant for multilobar pneumonia .   Subjective:    Grant Lara today has, No headache, No chest pain, No abdominal pain - Complaints of cough and generalized weakness.  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)   Dilated cardiomyopathy (HCC)   Chronic systolic heart failure (HCC)   COPD (chronic obstructive pulmonary disease) (HCC)   History of embolic stroke   Crohn's disease ([place])   Hyperglycemia   DM2 (diabetes mellitus, type 2) (HCC)   Hx pulmonary embolism   Leukocytosis   Anemia, chronic disease   Aphasia   CKD (chronic kidney disease) stage 3, GFR 30-59 ml/min   Coronary artery disease involving native coronary artery of native heart without angina pectoris   Late effects of CVA (cerebrovascular accident)   Cardiac syncope   Chronic systolic CHF (congestive heart failure) (HCC)   Generalized weakness   Protein-calorie malnutrition, severe ([place])  Vascular dementia without behavioral disturbance   ICD (implantable cardioverter-defibrillator) in place   Hypoxia   1. HCAP CT chest significant for bilateral lower lobe pneumonia. Pt is seen by SLP, MBS on 09/30/2015 showing only mild dysphagia, condition for regular diet with thin liquid - febrile overnight, still with significant leukocytosis, but trending down. We will cont IV vac and Zosyn, pend sputum cultures, and blood cultures  2. Advanced dementia Continue denepezil  3. Diabetes mellitus type 2 Hemoglobin A1c 6.8, continue sliding scale insulin   4. Dilated cardiomyopathy with an EF of 20-25% based on 2-D echo in February 2016. History of coronary artery disease with prior PCI to LAD in 2015 - Denies any chest pain or shortness of breath, Continue with aspirin and statin, appears euvolemic  5. History of pulmonary embolism on Xarelto  6. AKI Resolved with gentle hydration  Code Status : Full  Family Communication  : none at bedside  Disposition Plan  : PT Recommending sniff  Consults  :  none  Procedures  : none  DVT Prophylaxis  : Xarelto  Lab Results  Component Value Date   PLT 371 10/02/2015    Antibiotics  :   Anti-infectives    Start     Dose/Rate Route Frequency Ordered Stop   09/30/15 1700  vancomycin (VANCOCIN) IVPB 750 mg/150 ml premix     750 mg 150 mL/hr over 60 Minutes Intravenous Every 24 hours 09/29/15 1852     09/29/15 1850  piperacillin-tazobactam (ZOSYN) IVPB 3.375 g     3.375 g 12.5 mL/hr over 240 Minutes Intravenous Every 8 hours 09/29/15 1852     09/29/15 1615  vancomycin (VANCOCIN) IVPB 1000 mg/200 mL premix     1,000 mg 200 mL/hr over 60 Minutes Intravenous  Once 09/29/15 1606 09/29/15 1808   09/29/15 1615  piperacillin-tazobactam (ZOSYN) IVPB 3.375 g     3.375 g 100 mL/hr over 30 Minutes Intravenous  Once 09/29/15 1606 09/29/15 1708        Objective:   Filed Vitals:   10/01/15 1336 10/01/15 2128 10/02/15 0511 10/02/15 0618    BP: 108/59 122/56 126/68   Pulse: 89 81 75   Temp: 98.4 F (36.9 C) 100.7 F (38.2 C) 98.7 F (37.1 C)   TempSrc:  Oral Oral   Resp: 19 19 18    Weight:    54.1 kg (119 lb 4.3 oz)  SpO2: 100% 98% 98%     Wt Readings from Last 3 Encounters:  10/02/15 54.1 kg (119 lb 4.3 oz)  09/13/15 54.885 kg (121 lb)  09/12/15 54.885 kg (121 lb)     Intake/Output Summary (Last 24 hours) at 10/02/15 1123 Last data filed at 10/02/15 0755  Gross per 24 hour  Intake    660 ml  Output    520 ml  Net    140 ml     Physical Exam  Awake Alert, Oriented X 3, Supple Neck,No JVD,  Symmetrical Chest wall movement, Good air movement bilaterally, No wheezing RRR,No Gallops,Rubs or new Murmurs, No Parasternal Heave +ve B.Sounds, Abd Soft, No tenderness,  No rebound - guarding or rigidity. No Cyanosis, Clubbing or edema, No new Rash or bruise      Data Review:    CBC  Recent Labs Lab 09/29/15 1210 09/30/15 0512 10/02/15 0634  WBC 19.2* 18.4* 16.1*  HGB 9.2* 8.4* 8.2*  HCT 32.1* 29.6* 29.2*  PLT 309 332 371  MCV 73.6* 72.2* 72.6*  MCH 21.1* 20.5* 20.4*  MCHC 28.7* 28.4* 28.1*  RDW 20.9* 21.1* 21.0*  LYMPHSABS 1.5  --   --   MONOABS 2.1*  --   --   EOSABS 0.0  --   --   BASOSABS 0.0  --   --     Chemistries   Recent Labs Lab 09/29/15 1210 09/30/15 0512 10/02/15 0634  NA 136 135 137  K 4.0 4.0 4.3  CL 105 101 104  CO2 23 25 26   GLUCOSE 126* 103* 114*  BUN 23* 20 18  CREATININE 1.61* 1.22 1.10  CALCIUM 8.7* 8.8* 8.8*  AST  --  19  --   ALT  --  37  --   ALKPHOS  --  109  --   BILITOT  --  1.0  --    ------------------------------------------------------------------------------------------------------------------ No results for input(s): CHOL, HDL, LDLCALC, TRIG, CHOLHDL, LDLDIRECT in the last 72 hours.  Lab Results  Component Value Date   HGBA1C 6.8* 09/08/2015    ------------------------------------------------------------------------------------------------------------------ No results for input(s): TSH, T4TOTAL, T3FREE, THYROIDAB in the last 72 hours.  Invalid input(s): FREET3 ------------------------------------------------------------------------------------------------------------------ No results for input(s): VITAMINB12, FOLATE, FERRITIN, TIBC, IRON, RETICCTPCT in the last 72 hours.  Coagulation profile No results for input(s): INR, PROTIME in the last 168 hours.  No results for input(s): DDIMER in the last 72 hours.  Cardiac Enzymes No results for input(s): CKMB, TROPONINI, MYOGLOBIN in the last 168 hours.  Invalid input(s): CK ------------------------------------------------------------------------------------------------------------------    Component Value Date/Time   BNP 88.7 09/29/2015 1210    Inpatient Medications  Scheduled Meds: . aspirin EC  81 mg Oral Daily  . atorvastatin  10 mg Oral q1800  . donepezil  10 mg Oral QHS  . feeding supplement (ENSURE ENLIVE)  237 mL Oral BID BM  . insulin aspart  0-9 Units Subcutaneous TID WC  . latanoprost  1 drop Both Eyes QHS  . piperacillin-tazobactam (ZOSYN)  IV  3.375 g Intravenous Q8H  . rivaroxaban  20 mg Oral Q supper  . senna  1 tablet Oral q morning - 10a  . sertraline  50 mg Oral BH-q7a  . sodium chloride flush  3 mL Intravenous Q12H  . tamsulosin  0.4 mg Oral Daily  . vancomycin  750 mg Intravenous Q24H   Continuous Infusions:  PRN Meds:.sodium chloride, acetaminophen **OR** acetaminophen, ALPRAZolam, levalbuterol, sodium chloride flush  Micro Results No results found for this or any previous visit (from the past 240 hour(s)).  Radiology Reports Dg Chest 2 View  09/29/2015  CLINICAL DATA:  Hypoxia EXAM: CHEST  2 VIEW COMPARISON:  09/08/2015 FINDINGS: Cardiac shadow is stable. Defibrillator is again noted. Lungs are well aerated bilaterally. No focal infiltrate or  effusion is noted. No bony abnormality is seen. IMPRESSION: No active cardiopulmonary disease. Electronically Signed   By: Inez Catalina M.D.   On: 09/29/2015 13:49   Dg Chest 2 View  09/08/2015  CLINICAL DATA:  Shortness of breath and weakness. EXAM: CHEST  2 VIEW COMPARISON:  12/21/2014 FINDINGS: Left-sided pacemaker unchanged. Lungs are hyperexpanded without focal consolidation or effusion. Cardiomediastinal silhouette is within normal. Remainder of the exam is unchanged. IMPRESSION: No acute cardiopulmonary disease. COPD. Electronically Signed   By: Marin Olp M.D.   On: 09/08/2015 11:20   Abd 1 View (kub)  09/08/2015  CLINICAL DATA:  75 year old male with diabetes, high blood pressure and hyperlipidemia presenting with generalized weakness and increased urinary frequency. On witnessed fall. Initial encounter. EXAM: ABDOMEN - 1 VIEW COMPARISON:  05/02/2014 and 07/16/2013. FINDINGS: No evidence of bowel obstruction. The possibility of free intraperitoneal air cannot be assessed on a supine view. No acute fracture noted. Vascular calcifications. IMPRESSION: No evidence of bowel obstruction. No acute fracture noted. Electronically Signed   By: Genia Del M.D.   On: 09/08/2015 23:37   Ct Head Wo Contrast  09/29/2015  CLINICAL DATA:  Possible seizure today. The patient was unresponsive for 30 seconds. Initial encounter. EXAM: CT  HEAD WITHOUT CONTRAST TECHNIQUE: Contiguous axial images were obtained from the base of the skull through the vertex without intravenous contrast. COMPARISON:  Head CT scan 09/08/2015 and 12/21/2014. FINDINGS: Atrophy and extensive chronic microvascular ischemic change are again seen. Bilateral cerebellar infarcts, most conspicuous in the left cerebellar hemisphere and bilateral subcortical infarcts best seen in the right caudate and thalamus are again identified. Anterior and posterior watershed territory infarcts bilaterally are also again identified. No evidence of acute  abnormality including hemorrhage, infarct, mass lesion, mass effect, midline shift or abnormal extra-axial fluid collection. No hydrocephalus or pneumocephalus. The calvarium is intact. Imaged paranasal sinuses mastoid air cells are clear. IMPRESSION: No acute abnormality. Extensive atrophy and multiple remote infarctions as seen on prior exams. Electronically Signed   By: Inge Rise M.D.   On: 09/29/2015 13:14   Ct Head Wo Contrast  09/08/2015  CLINICAL DATA:  Unwitnessed fall at nursing home. Initial encounter. EXAM: CT HEAD WITHOUT CONTRAST CT CERVICAL SPINE WITHOUT CONTRAST TECHNIQUE: Multidetector CT imaging of the head and cervical spine was performed following the standard protocol without intravenous contrast. Multiplanar CT image reconstructions of the cervical spine were also generated. COMPARISON:  Head CT 12/21/2014 FINDINGS: CT HEAD FINDINGS Skull and Sinuses:Negative for fracture or hemo sinus. Benign ground-glass density in the left occipital bone that is small and stable. Visualized orbits: Gaze to the right, nonspecific. No evidence of orbital injury. Brain: No evidence of acute infarction, hemorrhage, hydrocephalus, or mass lesion/mass effect. There is advanced chronic ischemic injury with numerous bilateral cerebellar infarcts, largest area in the inferior left cerebellum. Multiple bilateral subcortical infarcts, most discrete in the right thalamus and caudate head. Extensive fairly symmetric cortical infarct and atrophy, aligned in a watershed distribution. CT CERVICAL SPINE FINDINGS Negative for acute fracture or subluxation. No prevertebral edema. No gross cervical canal hematoma. Diffuse disc degeneration and spondylotic spurring. No indication of high-grade canal stenosis. IMPRESSION: 1. No evidence of acute intracranial or cervical spine injury. 2. Extensive chronic ischemic injury as described, stable from 12/21/2014 comparison. Electronically Signed   By: Monte Fantasia M.D.    On: 09/08/2015 11:04   Ct Chest Wo Contrast  09/29/2015  CLINICAL DATA:  75 year old male with a cough for the past 2 days. History of CHF. EXAM: CT CHEST WITHOUT CONTRAST TECHNIQUE: Multidetector CT imaging of the chest was performed following the standard protocol without IV contrast. COMPARISON:  Chest x-ray obtained earlier today ; prior chest CT 04/18/2014 FINDINGS: Mediastinum: Unremarkable CT appearance of the thyroid gland. No suspicious mediastinal or hilar adenopathy. No soft tissue mediastinal mass. The thoracic esophagus is unremarkable. Heart/Vascular: Limited evaluation in the absence of intravenous contrast. Left subclavian approach cardiac rhythm maintenance device with leads terminating in the right atrium and right ventricular apex. The heart is normal in size. No pericardial effusion. Atherosclerotic calcifications present throughout the coronary arteries. No pericardial effusion. Lungs/Pleura: Stable 2 cm thin walled cystic structure with adjacent pleural parenchymal scarring in the right upper lobe. Mild background centrilobular emphysema. Interval development of patchy ground-glass attenuation airspace opacities in a peribronchovascular distribution in the right greater than left lower lobes. Findings superimposed on a background of chronic scarring. Bones/Soft Tissues: No acute fracture or aggressive appearing lytic or blastic osseous lesion. Upper Abdomen: Multiple hypoechoic lesions throughout both kidneys which are incompletely characterized in the absence of intravenous contrast material. However, prior imaging demonstrated multiple simple cysts. No nephrolithiasis visualized. Upper abdominal organs are unremarkable. IMPRESSION: 1. Findings most consistent with an infectious/ inflammatory process  involving the bilateral lower lobes. Given the distribution, aspiration is a consideration as is multilobar pneumonia. 2. Coronary artery calcifications. 3. Bilateral renal cysts.  Electronically Signed   By: Jacqulynn Cadet M.D.   On: 09/29/2015 16:00   Ct Cervical Spine Wo Contrast  09/08/2015  CLINICAL DATA:  Unwitnessed fall at nursing home. Initial encounter. EXAM: CT HEAD WITHOUT CONTRAST CT CERVICAL SPINE WITHOUT CONTRAST TECHNIQUE: Multidetector CT imaging of the head and cervical spine was performed following the standard protocol without intravenous contrast. Multiplanar CT image reconstructions of the cervical spine were also generated. COMPARISON:  Head CT 12/21/2014 FINDINGS: CT HEAD FINDINGS Skull and Sinuses:Negative for fracture or hemo sinus. Benign ground-glass density in the left occipital bone that is small and stable. Visualized orbits: Gaze to the right, nonspecific. No evidence of orbital injury. Brain: No evidence of acute infarction, hemorrhage, hydrocephalus, or mass lesion/mass effect. There is advanced chronic ischemic injury with numerous bilateral cerebellar infarcts, largest area in the inferior left cerebellum. Multiple bilateral subcortical infarcts, most discrete in the right thalamus and caudate head. Extensive fairly symmetric cortical infarct and atrophy, aligned in a watershed distribution. CT CERVICAL SPINE FINDINGS Negative for acute fracture or subluxation. No prevertebral edema. No gross cervical canal hematoma. Diffuse disc degeneration and spondylotic spurring. No indication of high-grade canal stenosis. IMPRESSION: 1. No evidence of acute intracranial or cervical spine injury. 2. Extensive chronic ischemic injury as described, stable from 12/21/2014 comparison. Electronically Signed   By: Monte Fantasia M.D.   On: 09/08/2015 11:04   Portable Chest 1 View  09/30/2015  CLINICAL DATA:  Health care associated pneumonia. EXAM: PORTABLE CHEST 1 VIEW COMPARISON:  Radiograph of September 29, 2015. FINDINGS: The heart size and mediastinal contours are within normal limits. Both lungs are clear. No pneumothorax or pleural effusion is noted. Left-sided  pacemaker is unchanged in position. The visualized skeletal structures are unremarkable. IMPRESSION: No acute cardiopulmonary abnormality seen. Electronically Signed   By: Marijo Conception, M.D.   On: 09/30/2015 10:08   Dg Swallowing Func-speech Pathology  09/30/2015  Objective Swallowing Evaluation: Type of Study: MBS-Modified Barium Swallow Study Patient Details Name: SHYNE LEHRKE MRN: [id number] Date of Birth: November 11, 1940 Today's Date: 09/30/2015 Time: SLP Start Time (ACUTE ONLY): 1100-SLP Stop Time (ACUTE ONLY): 1120 SLP Time Calculation (min) (ACUTE ONLY): 20 min Past Medical History: Past Medical History Diagnosis Date . Hypertension  . Dilated cardiomyopathy ([place])    2/12: EF 30-35%, trivial AI, mild RAE.  EF 2014 40 -45% . CAD (coronary artery disease)    LHC 9/05 with Dr. Einar Gip:  dLM 20-30%, LAD 85%, oD1 20-30%.  PCI:  Taxus DES to LAD; Dx jailed and tx with POBA.  Last myoview 12/10: inf scar, no ischemia, EF 29%. . DVT (deep venous thrombosis) ([place])  . Pulmonary embolus (HCC)    chronic coumadin . 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 PFO (patent foramen ovale)    Not mentioned on 2014 echo. [name] HLD (hyperlipidemia)  . Crohn's disease ([place])  . Nephrolithiasis  . Diabetes mellitus    11/05/11 "borderline; don't take medications" . Stroke [facility]) 1993   "left arm can't hold steady; leg too" . Arthritis    "used to have a touch in my legs" . Memory difficulties  . Swelling of both ankles     09-22-13 occ.feet, but denies pain. . Bradycardia 06/01/2014 . Dementia  . Hyperlipidemia  . CHF (congestive heart failure) ([place])  . Pneumonia ~ 2011   09-22-13 denies any recent SOB  or breathing problems . HCAP (healthcare-associated pneumonia) 09/29/2015 . AICD (automatic cardioverter/defibrillator) present  Past Surgical History: Past Surgical History Procedure Laterality Date . Colon surgery  1994; 1996   "for Crohn's disease" . Appendectomy   . Implantable cardioverter defibrillator implant N/A 06/02/2014   Procedure: IMPLANTABLE  CARDIOVERTER DEFIBRILLATOR IMPLANT;  Surgeon: [name], MD;  Location: [facility] CATH LAB;  Service: Cardiovascular;  Laterality: N/A; HPI: Grant Lara is a 75 y.o. male who was recently discharged presents via [facility] for possible syncopal episode vs seizure. Pt was at PT when he had a 30 second episode of "unresponsiveness" with a right sided gaze. Pt was sitting, no fall or head injury. After episode pt had generalized bilateral weakness. No history of seizure known. Pt responsive to verbal stimuli. GCS reported 13. Follows commands. EMS reported rhonchi in lower bases with nonproductive cough. Oxygen sat in 80s RA on arrival. He has a PMH of dementia, hypertension, hyperlipidemia, diabetes mellitus, depression, CAD, DVT, PE on Xarelto, PFO, chronic disease, stroke, BPH, systolic congestive heart failure (EF 20-25%), who presents with generalized weakness and increased cough, chest congestion and shortness of breath.The patient was evaluated in the ED and found to have an acute leukocytosis, hypoxia and abnormal lung sounds. A CT of the chest was obtained that was significant for multifocal bilateral pneumonia. No Data Recorded Assessment / Plan / Recommendation CHL IP CLINICAL IMPRESSIONS 09/30/2015 Therapy Diagnosis Mild pharyngeal phase dysphagia Clinical Impression Pt demonstrates mild oropharyngeal dysphagia characterized by delayed swallow initiation with consistent flash penetration of liquids. Laryngeal closure is adequate and airway remained protected throughout assessment despite large consecutive swallows. Recommend pt consume a regular diet and thin liquids. No SLP f/u needed, will sign off.  Impact on safety and function Mild aspiration risk   CHL IP TREATMENT RECOMMENDATION 09/30/2015 Treatment Recommendations No treatment recommended at this time   No flowsheet data found. CHL IP DIET RECOMMENDATION 09/30/2015 SLP Diet Recommendations Regular solids;Thin  liquid Liquid Administration via Cup;Straw Medication Administration Whole meds with liquid Compensations Slow rate;Small sips/bites Postural Changes Seated upright at 90 degrees   CHL IP OTHER RECOMMENDATIONS 09/30/2015 Recommended Consults -- Oral Care Recommendations Oral care BID Other Recommendations --   CHL IP FOLLOW UP RECOMMENDATIONS 09/30/2015 Follow up Recommendations None   CHL IP FREQUENCY AND DURATION 04/18/2014 Speech Therapy Frequency (ACUTE ONLY) min 2x/week Treatment Duration --      CHL IP ORAL PHASE 09/30/2015 Oral Phase WFL Oral - Pudding Teaspoon -- Oral - Pudding Cup -- Oral - Honey Teaspoon -- Oral - Honey Cup -- Oral - Nectar Teaspoon -- Oral - Nectar Cup -- Oral - Nectar Straw -- Oral - Thin Teaspoon -- Oral - Thin Cup -- Oral - Thin Straw -- Oral - Puree -- Oral - Mech Soft -- Oral - Regular -- Oral - Multi-Consistency -- Oral - Pill -- Oral Phase - Comment --  CHL IP PHARYNGEAL PHASE 09/30/2015 Pharyngeal Phase Impaired Pharyngeal- Pudding Teaspoon -- Pharyngeal -- Pharyngeal- Pudding Cup -- Pharyngeal -- Pharyngeal- Honey Teaspoon -- Pharyngeal -- Pharyngeal- Honey Cup -- Pharyngeal -- Pharyngeal- Nectar Teaspoon -- Pharyngeal -- Pharyngeal- Nectar Cup -- Pharyngeal -- Pharyngeal- Nectar Straw -- Pharyngeal -- Pharyngeal- Thin Teaspoon -- Pharyngeal -- Pharyngeal- Thin Cup Delayed swallow initiation-pyriform sinuses;Penetration/Aspiration before swallow;Penetration/Aspiration during swallow Pharyngeal Material enters airway, remains ABOVE vocal cords then ejected out Pharyngeal- Thin Straw Delayed swallow initiation-pyriform sinuses;Penetration/Aspiration before swallow;Penetration/Aspiration during swallow Pharyngeal Material enters airway, remains ABOVE vocal cords then  ejected out Pharyngeal- Puree Delayed swallow initiation-pyriform sinuses Pharyngeal Material does not enter airway Pharyngeal- Mechanical Soft -- Pharyngeal -- Pharyngeal- Regular Delayed swallow initiation-pyriform sinuses  Pharyngeal -- Pharyngeal- Multi-consistency -- Pharyngeal -- Pharyngeal- Pill Delayed swallow initiation-pyriform sinuses;Penetration/Aspiration before swallow;Penetration/Aspiration during swallow Pharyngeal Material enters airway, remains ABOVE vocal cords then ejected out Pharyngeal Comment --  No flowsheet data found. No flowsheet data found. Herbie [place], [place] CCC-SLP [id number] Lynann Beaver 09/30/2015, 12:49 PM               Time Spent in minutes  25 minutes   Kinnie Feil M.D on 10/02/2015 at 11:23 AM  Between 7am to 7pm - Pager - [phone] After 7pm go to [email or web address] - password [email or web address]  [facility] -  Office  [phone]

## 2015-10-02 NOTE — Care Management Important Message (Signed)
Important Message  Patient Details  Name: Grant Lara MRN: [id number] Date of Birth: [birth year]   Medicare Important Message Given:  Yes    Nathen May 10/02/2015, 11:39 AM

## 2015-10-02 NOTE — Progress Notes (Signed)
Physical Therapy Treatment Patient Details Name: Grant Lara MRN: [id number] DOB: 25-Jul-1940 Today's Date: 10/02/2015    History of Present Illness Grant Lara is a 75 y.o. male who was recently discharged presents via [facility] from [facility] for possible syncopal episode vs seizure. Pt was at PT when he had a 30 second episode of "unresponsiveness" with a right sided gaze. Pt was sitting, no fall or head injury. After episode pt had generalized bilateral weakness. No history of seizure known. Pt responsive to verbal stimuli. GCS reported 13. Follows commands. EMS reported rhonchi in lower bases with nonproductive cough. Oxygen sat in 80s RA on arrival. He has a PMH of dementia, hypertension, hyperlipidemia, diabetes mellitus, depression, CAD, DVT, PE on Xarelto, PFO, chronic disease, stroke, BPH, systolic congestive heart failure (EF 20-25%), who presents with generalized weakness and increased cough, chest congestion and shortness of breath.The patient was evaluated in the ED and found to have an acute leukocytosis, hypoxia and abnormal lung sounds. A CT of the chest was obtained that was significant for multifocal bilateral pneumonia.     PT Comments    Patient continues to need assistance for safe OOB mobiltiy due to balance deficits and decreased safety awareness. Pt c/o pain in L hip this session although demo'd improved L step length vs previous session.  Current plan remains appropriate.   Follow Up Recommendations  SNF;Supervision/Assistance - 24 hour     Equipment Recommendations  Other (comment) (TBA)    Recommendations for Other Services       Precautions / Restrictions Precautions Precautions: Fall Restrictions Weight Bearing Restrictions: No    Mobility  Bed Mobility Overal bed mobility: Needs Assistance Bed Mobility: Supine to Sit     Supine to sit: Min guard;HOB elevated     General bed mobility comments: min guard for safety; increased  time and cues for initiation and completion of task but no physical assist needed  Transfers Overall transfer level: Needs assistance Equipment used: Rolling walker (2 wheeled) Transfers: Sit to/from Stand Sit to Stand: Min guard         General transfer comment: cues for inititaion; min guard for safety  Ambulation/Gait Ambulation/Gait assistance: Min assist Ambulation Distance (Feet): 150 Feet Assistive device: Rolling walker (2 wheeled) Gait Pattern/deviations: Shuffle;Step-through pattern;Decreased step length - right;Decreased step length - left;Trunk flexed     General Gait Details: multimodal cues for posture, vc for bilat step length, and assist to manage RW with cues for position; pt ran into objects in hallway and in room without physical assist to direct RW; pt with improved L step length but continues to demo shuffling gait at times which is worse with fatigue   Stairs            Wheelchair Mobility    Modified Rankin (Stroke Patients Only)       Balance   Sitting-balance support: Feet supported;Bilateral upper extremity supported Sitting balance-Leahy Scale: Poor Sitting balance - Comments: needed Ue support for balance   Standing balance support: Bilateral upper extremity supported Standing balance-Leahy Scale: Poor                 High Level Balance Comments: pt unable to attempt head turns    Cognition Arousal/Alertness: Awake/alert Behavior During Therapy: Flat affect Overall Cognitive Status: History of cognitive impairments - at baseline                      Exercises  General Comments        Pertinent Vitals/Pain Pain Assessment: Faces Faces Pain Scale: Hurts little more Pain Location: L hip Pain Descriptors / Indicators: Sore Pain Intervention(s): Limited activity within patient's tolerance;Monitored during session;Repositioned    Home Living                      Prior Function            PT  Goals (current goals can now be found in the care plan section) Acute Rehab PT Goals Patient Stated Goal: to get better PT Goal Formulation: With patient Time For Goal Achievement: 10/14/15 Potential to Achieve Goals: Good Progress towards PT goals: Progressing toward goals    Frequency  Min 2X/week    PT Plan Current plan remains appropriate    Co-evaluation             End of Session Equipment Utilized During Treatment: Gait belt Activity Tolerance: Patient tolerated treatment well Patient left: with call bell/phone within reach;in chair;with chair alarm set (ST asked for pt to be back in bed due to going for MBS)     Time: 0931-1000 PT Time Calculation (min) (ACUTE ONLY): 29 min  Charges:  $Gait Training: 8-22 mins $Therapeutic Activity: 8-22 mins                    G Codes:      Salina April, PTA Pager: [phone]   10/02/2015, 10:09 AM

## 2015-10-02 NOTE — Progress Notes (Signed)
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 Currently on day 3 of Zosyn and vancomycin.  Plan: 1. Continue current vancomycin dose of 750 mg Q24 hours for now; will likely obtain trough before 6/20 pm dose to evaluate current dosing regimen; goal trough 15 - 20. 2. SCr Q72H while on vancomycin  3. Continue extended infusion Zosyn 3.375 grams IV every 8 hours   Weight: 119 lb 4.3 oz (54.1 kg)  Temp (24hrs), Avg:99.3 F (37.4 C), Min:98.4 F (36.9 C), Max:100.7 F (38.2 C)   Recent Labs Lab 09/29/15 1210 09/29/15 1900 09/30/15 0512 10/02/15 0634  WBC 19.2*  --  18.4* 16.1*  CREATININE 1.61*  --  1.22 1.10  LATICACIDVEN  --  1.7  --   --     Estimated Creatinine Clearance: 43.7 mL/min (by C-G formula based on Cr of 1.1).    No Known Allergies  Antimicrobials this admission: 6/16 Vancomycin  >>  6/16 Zosyn >>   Dose adjustments this admission: n/a  Microbiology results: 6/16 BCx: in process    Thank you for allowing pharmacy to be a part of this patient's care.  Vincenza Hews, PharmD, BCPS 10/02/2015, 10:13 AM Pager: [phone]

## 2015-10-02 NOTE — Care Management Note (Signed)
Case Management Note  Patient Details  Name: RAYHAAN HUSTER MRN: [id number] Date of Birth: [birth year]  Subjective/Objective:                 Patient admitted from [facility] for HCAP and confusion. Patient still showing signs of confusion, alert to 2 per bedside RN this AM.   Action/Plan:  DC to SNF as facilitated by CSW when medically stable.   Expected Discharge Date:                  Expected Discharge Plan:  Skilled Nursing Facility  In-House Referral:  Clinical Social Work  Discharge planning Services  CM Consult  Post Acute Care Choice:    Choice offered to:     DME Arranged:    DME Agency:     HH Arranged:    [place] Agency:     Status of Service:  In process, will continue to follow  Medicare Important Message Given:    Date Medicare IM Given:    Medicare IM give by:    Date Additional Medicare IM Given:    Additional Medicare Important Message give by:     If discussed at [place] of Stay Meetings, dates discussed:    Additional Comments:  Carles Collet, RN 10/02/2015, 10:22 AM

## 2015-10-03 LAB — BASIC METABOLIC PANEL
ANION GAP: 7 (ref 5–15)
BUN: 21 mg/dL (ref 4–21)
BUN: 21 mg/dL — ABNORMAL HIGH (ref 6–20)
CALCIUM: 8.7 mg/dL — AB (ref 8.9–10.3)
CO2: 25 mmol/L (ref 22–32)
CREATININE: 1.2 mg/dL (ref 0.6–1.3)
Chloride: 103 mmol/L (ref 101–111)
Creatinine, Ser: 1.21 mg/dL (ref 0.61–1.24)
GFR, EST NON AFRICAN AMERICAN: 56 mL/min — AB (ref >60)
GLUCOSE: 207 mg/dL
GLUCOSE: 207 mg/dL — AB (ref 65–99)
Potassium: 4 mmol/L (ref 3.4–5.3)
Potassium: 4 mmol/L (ref 3.5–5.1)
SODIUM: 135 mmol/L (ref 135–145)
Sodium: 135 mmol/L — AB (ref 137–147)

## 2015-10-03 LAB — STREP PNEUMONIAE URINARY ANTIGEN: STREP PNEUMO URINARY ANTIGEN: NEGATIVE

## 2015-10-03 LAB — GLUCOSE, CAPILLARY
GLUCOSE-CAPILLARY: 144 mg/dL — AB (ref 65–99)
GLUCOSE-CAPILLARY: 268 mg/dL — AB (ref 65–99)
GLUCOSE-CAPILLARY: 198 mg/dL — AB (ref 65–99)
GLUCOSE-CAPILLARY: 163 mg/dL — AB (ref 65–99)

## 2015-10-03 LAB — VANCOMYCIN, TROUGH: VANCOMYCIN TR: 5 ug/mL — AB (ref 10.0–20.0)

## 2015-10-03 MED ORDER — VANCOMYCIN HCL IN DEXTROSE 1-5 GM/200ML-% IV SOLN
1000.0000 mg | Freq: Two times a day (BID) | INTRAVENOUS | Status: DC
Start: 2015-10-03 — End: 2015-10-04
  Administered 2015-10-03 – 2015-10-04 (×2): 1000 mg via INTRAVENOUS
  Filled 2015-10-03 (×4): qty 200

## 2015-10-03 NOTE — Progress Notes (Signed)
PROGRESS NOTE                                                                                                                                                                                                             Patient Demographics:    Grant Lara, is a 75 y.o. male, DOB - [birth year], HYW:[id number]  Admit date - 09/29/2015   Admitting Physician Murlean Iba, MD  Outpatient Primary MD for the patient is [name], MD  LOS - 4  Chief Complaint  Patient presents with  . Loss of Consciousness       Brief Narrative   75 y.o. male with PMH of dementia, hypertension, hyperlipidemia, diabetes mellitus, depression, CAD, DVT, PE on Xarelto, PFO, chronic disease, stroke, BPH, systolic congestive heart failure (EF 20-25%), recently discharged for anemia workup, presents with generalized weakness and presyncope, workup significant for multilobar pneumonia .   Subjective:    Grant Lara today has, No headache, No chest pain, No abdominal pain - Complaints of cough and generalized weakness.  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)   Dilated cardiomyopathy (HCC)   Chronic systolic heart failure (HCC)   COPD (chronic obstructive pulmonary disease) (HCC)   History of embolic stroke   Crohn's disease ([place])   Hyperglycemia   DM2 (diabetes mellitus, type 2) (HCC)   Hx pulmonary embolism   Leukocytosis   Anemia, chronic disease   Aphasia   CKD (chronic kidney disease) stage 3, GFR 30-59 ml/min   Coronary artery disease involving native coronary artery of native heart without angina pectoris   Late effects of CVA (cerebrovascular accident)   Cardiac syncope   Chronic systolic CHF (congestive heart failure) (HCC)   Generalized weakness   Protein-calorie malnutrition, severe ([place])  Vascular dementia without behavioral disturbance   ICD (implantable cardioverter-defibrillator) in place   Hypoxia   1. HCAP CT chest significant for bilateral lower lobe pneumonia. Pt is seen by SLP, MBS on 09/30/2015 showing only mild dysphagia, condition for regular diet with thin liquid -febrile overnight, leukocytosis is  trending down. We will cont IV vac and Zosyn, prelim blood cultures: NGTD. Aspiration precautions    2. Advanced dementia Continue denepezil  3. Diabetes mellitus type 2 Hemoglobin A1c 6.8, continue sliding scale insulin   4. Dilated cardiomyopathy with an EF of 20-25% based on 2-D echo in February 2016. History of coronary artery disease with prior PCI to LAD in 2015 - Denies any chest pain or shortness of breath, Continue with aspirin and statin, appears euvolemic  5. History of pulmonary embolism on Xarelto  6. AKI Resolved with gentle hydration  Code Status : Full  Family Communication  : none at bedside  Disposition Plan  : PT Recommending snf  Consults  :  none  Procedures  : none  DVT Prophylaxis  : Xarelto  Lab Results  Component Value Date   PLT 371 10/02/2015    Antibiotics  :   Anti-infectives    Start     Dose/Rate Route Frequency Ordered Stop   09/30/15 1700  vancomycin (VANCOCIN) IVPB 750 mg/150 ml premix     750 mg 150 mL/hr over 60 Minutes Intravenous Every 24 hours 09/29/15 1852     09/29/15 1850  piperacillin-tazobactam (ZOSYN) IVPB 3.375 g     3.375 g 12.5 mL/hr over 240 Minutes Intravenous Every 8 hours 09/29/15 1852     09/29/15 1615  vancomycin (VANCOCIN) IVPB 1000 mg/200 mL premix     1,000 mg 200 mL/hr over 60 Minutes Intravenous  Once 09/29/15 1606 09/29/15 1808   09/29/15 1615  piperacillin-tazobactam (ZOSYN) IVPB 3.375 g     3.375 g 100 mL/hr over 30 Minutes Intravenous  Once 09/29/15 1606 09/29/15 1708        Objective:   Filed Vitals:   10/02/15 0618 10/02/15 1339 10/02/15 2154 10/03/15 0531  BP:  115/61  138/67 131/60  Pulse:  68 116 112  Temp:  98.4 F (36.9 C) 102.1 F (38.9 C) 99 F (37.2 C)  TempSrc:  Oral    Resp:  14 16 16   Weight: 54.1 kg (119 lb 4.3 oz)   55.2 kg (121 lb 11.1 oz)  SpO2:  99% 91% 100%    Wt Readings from Last 3 Encounters:  10/03/15 55.2 kg (121 lb 11.1 oz)  09/13/15 54.885 kg (121 lb)  09/12/15 54.885 kg (121 lb)     Intake/Output Summary (Last 24 hours) at 10/03/15 1124 Last data filed at 10/03/15 0532  Gross per 24 hour  Intake    450 ml  Output    725 ml  Net   -275 ml     Physical Exam  Awake Alert, Oriented X 3, Supple Neck,No JVD,  Symmetrical Chest wall movement, Good air movement bilaterally, No wheezing RRR,No Gallops,Rubs or new Murmurs, No Parasternal Heave +ve B.Sounds, Abd Soft, No tenderness,  No rebound - guarding or rigidity. No Cyanosis, Clubbing or edema, No new Rash or bruise      Data Review:    CBC  Recent Labs Lab 09/29/15 1210 09/30/15 0512 10/02/15 0634  WBC 19.2* 18.4* 16.1*  HGB 9.2* 8.4* 8.2*  HCT 32.1* 29.6* 29.2*  PLT 309 332 371  MCV 73.6* 72.2* 72.6*  MCH 21.1* 20.5* 20.4*  MCHC 28.7* 28.4* 28.1*  RDW 20.9* 21.1* 21.0*  LYMPHSABS 1.5  --   --   MONOABS 2.1*  --   --   EOSABS 0.0  --   --   BASOSABS 0.0  --   --     Chemistries   Recent Labs Lab 09/29/15 1210 09/30/15 0512 10/02/15 0634  NA 136 135 137  K 4.0 4.0 4.3  CL 105  101 104  CO2 23 25 26   GLUCOSE 126* 103* 114*  BUN 23* 20 18  CREATININE 1.61* 1.22 1.10  CALCIUM 8.7* 8.8* 8.8*  AST  --  19  --   ALT  --  37  --   ALKPHOS  --  109  --   BILITOT  --  1.0  --    ------------------------------------------------------------------------------------------------------------------ No results for input(s): CHOL, HDL, LDLCALC, TRIG, CHOLHDL, LDLDIRECT in the last 72 hours.  Lab Results  Component Value Date   HGBA1C 6.8* 09/08/2015    ------------------------------------------------------------------------------------------------------------------ No results for input(s): TSH, T4TOTAL, T3FREE, THYROIDAB in the last 72 hours.  Invalid input(s): FREET3 ------------------------------------------------------------------------------------------------------------------ No results for input(s): VITAMINB12, FOLATE, FERRITIN, TIBC, IRON, RETICCTPCT in the last 72 hours.  Coagulation profile No results for input(s): INR, PROTIME in the last 168 hours.  No results for input(s): DDIMER in the last 72 hours.  Cardiac Enzymes No results for input(s): CKMB, TROPONINI, MYOGLOBIN in the last 168 hours.  Invalid input(s): CK ------------------------------------------------------------------------------------------------------------------    Component Value Date/Time   BNP 88.7 09/29/2015 1210    Inpatient Medications  Scheduled Meds: . aspirin EC  81 mg Oral Daily  . atorvastatin  10 mg Oral q1800  . donepezil  10 mg Oral QHS  . feeding supplement (ENSURE ENLIVE)  237 mL Oral BID BM  . insulin aspart  0-9 Units Subcutaneous TID WC  . latanoprost  1 drop Both Eyes QHS  . piperacillin-tazobactam (ZOSYN)  IV  3.375 g Intravenous Q8H  . rivaroxaban  20 mg Oral Q supper  . senna  1 tablet Oral q morning - 10a  . sertraline  50 mg Oral BH-q7a  . sodium chloride flush  3 mL Intravenous Q12H  . tamsulosin  0.4 mg Oral Daily  . vancomycin  750 mg Intravenous Q24H   Continuous Infusions:  PRN Meds:.sodium chloride, acetaminophen **OR** acetaminophen, ALPRAZolam, levalbuterol, sodium chloride flush  Micro Results Recent Results (from the past 240 hour(s))  Culture, blood (Routine X 2) w Reflex to ID Panel     Status: None (Preliminary result)   Collection Time: 10/01/15  6:42 PM  Result Value Ref Range Status   Specimen Description BLOOD RIGHT ARM  Final   Special Requests BOTTLES DRAWN AEROBIC ONLY 5CC  Final   Culture NO  GROWTH < 24 HOURS  Final   Report Status PENDING  Incomplete  Culture, blood (Routine X 2) w Reflex to ID Panel     Status: None (Preliminary result)   Collection Time: 10/01/15  6:50 PM  Result Value Ref Range Status   Specimen Description BLOOD RIGHT HAND  Final   Special Requests IN PEDIATRIC BOTTLE 2CC  Final   Culture NO GROWTH < 24 HOURS  Final   Report Status PENDING  Incomplete    Radiology Reports Dg Chest 2 View  09/29/2015  CLINICAL DATA:  Hypoxia EXAM: CHEST  2 VIEW COMPARISON:  09/08/2015 FINDINGS: Cardiac shadow is stable. Defibrillator is again noted. Lungs are well aerated bilaterally. No focal infiltrate or effusion is noted. No bony abnormality is seen. IMPRESSION: No active cardiopulmonary disease. Electronically Signed   By: Inez Catalina M.D.   On: 09/29/2015 13:49   Dg Chest 2 View  09/08/2015  CLINICAL DATA:  Shortness of breath and weakness. EXAM: CHEST  2 VIEW COMPARISON:  12/21/2014 FINDINGS: Left-sided pacemaker unchanged. Lungs are hyperexpanded without focal consolidation or effusion. Cardiomediastinal silhouette is within normal. Remainder of the exam is unchanged. IMPRESSION: No  acute cardiopulmonary disease. COPD. Electronically Signed   By: Marin Olp M.D.   On: 09/08/2015 11:20   Abd 1 View (kub)  09/08/2015  CLINICAL DATA:  75 year old male with diabetes, high blood pressure and hyperlipidemia presenting with generalized weakness and increased urinary frequency. On witnessed fall. Initial encounter. EXAM: ABDOMEN - 1 VIEW COMPARISON:  05/02/2014 and 07/16/2013. FINDINGS: No evidence of bowel obstruction. The possibility of free intraperitoneal air cannot be assessed on a supine view. No acute fracture noted. Vascular calcifications. IMPRESSION: No evidence of bowel obstruction. No acute fracture noted. Electronically Signed   By: Genia Del M.D.   On: 09/08/2015 23:37   Ct Head Wo Contrast  09/29/2015  CLINICAL DATA:  Possible seizure today. The patient  was unresponsive for 30 seconds. Initial encounter. EXAM: CT HEAD WITHOUT CONTRAST TECHNIQUE: Contiguous axial images were obtained from the base of the skull through the vertex without intravenous contrast. COMPARISON:  Head CT scan 09/08/2015 and 12/21/2014. FINDINGS: Atrophy and extensive chronic microvascular ischemic change are again seen. Bilateral cerebellar infarcts, most conspicuous in the left cerebellar hemisphere and bilateral subcortical infarcts best seen in the right caudate and thalamus are again identified. Anterior and posterior watershed territory infarcts bilaterally are also again identified. No evidence of acute abnormality including hemorrhage, infarct, mass lesion, mass effect, midline shift or abnormal extra-axial fluid collection. No hydrocephalus or pneumocephalus. The calvarium is intact. Imaged paranasal sinuses mastoid air cells are clear. IMPRESSION: No acute abnormality. Extensive atrophy and multiple remote infarctions as seen on prior exams. Electronically Signed   By: Inge Rise M.D.   On: 09/29/2015 13:14   Ct Head Wo Contrast  09/08/2015  CLINICAL DATA:  Unwitnessed fall at nursing home. Initial encounter. EXAM: CT HEAD WITHOUT CONTRAST CT CERVICAL SPINE WITHOUT CONTRAST TECHNIQUE: Multidetector CT imaging of the head and cervical spine was performed following the standard protocol without intravenous contrast. Multiplanar CT image reconstructions of the cervical spine were also generated. COMPARISON:  Head CT 12/21/2014 FINDINGS: CT HEAD FINDINGS Skull and Sinuses:Negative for fracture or hemo sinus. Benign ground-glass density in the left occipital bone that is small and stable. Visualized orbits: Gaze to the right, nonspecific. No evidence of orbital injury. Brain: No evidence of acute infarction, hemorrhage, hydrocephalus, or mass lesion/mass effect. There is advanced chronic ischemic injury with numerous bilateral cerebellar infarcts, largest area in the inferior  left cerebellum. Multiple bilateral subcortical infarcts, most discrete in the right thalamus and caudate head. Extensive fairly symmetric cortical infarct and atrophy, aligned in a watershed distribution. CT CERVICAL SPINE FINDINGS Negative for acute fracture or subluxation. No prevertebral edema. No gross cervical canal hematoma. Diffuse disc degeneration and spondylotic spurring. No indication of high-grade canal stenosis. IMPRESSION: 1. No evidence of acute intracranial or cervical spine injury. 2. Extensive chronic ischemic injury as described, stable from 12/21/2014 comparison. Electronically Signed   By: Monte Fantasia M.D.   On: 09/08/2015 11:04   Ct Chest Wo Contrast  09/29/2015  CLINICAL DATA:  75 year old male with a cough for the past 2 days. History of CHF. EXAM: CT CHEST WITHOUT CONTRAST TECHNIQUE: Multidetector CT imaging of the chest was performed following the standard protocol without IV contrast. COMPARISON:  Chest x-ray obtained earlier today ; prior chest CT 04/18/2014 FINDINGS: Mediastinum: Unremarkable CT appearance of the thyroid gland. No suspicious mediastinal or hilar adenopathy. No soft tissue mediastinal mass. The thoracic esophagus is unremarkable. Heart/Vascular: Limited evaluation in the absence of intravenous contrast. Left subclavian approach cardiac rhythm maintenance device  with leads terminating in the right atrium and right ventricular apex. The heart is normal in size. No pericardial effusion. Atherosclerotic calcifications present throughout the coronary arteries. No pericardial effusion. Lungs/Pleura: Stable 2 cm thin walled cystic structure with adjacent pleural parenchymal scarring in the right upper lobe. Mild background centrilobular emphysema. Interval development of patchy ground-glass attenuation airspace opacities in a peribronchovascular distribution in the right greater than left lower lobes. Findings superimposed on a background of chronic scarring. Bones/Soft  Tissues: No acute fracture or aggressive appearing lytic or blastic osseous lesion. Upper Abdomen: Multiple hypoechoic lesions throughout both kidneys which are incompletely characterized in the absence of intravenous contrast material. However, prior imaging demonstrated multiple simple cysts. No nephrolithiasis visualized. Upper abdominal organs are unremarkable. IMPRESSION: 1. Findings most consistent with an infectious/ inflammatory process involving the bilateral lower lobes. Given the distribution, aspiration is a consideration as is multilobar pneumonia. 2. Coronary artery calcifications. 3. Bilateral renal cysts. Electronically Signed   By: Jacqulynn Cadet M.D.   On: 09/29/2015 16:00   Ct Cervical Spine Wo Contrast  09/08/2015  CLINICAL DATA:  Unwitnessed fall at nursing home. Initial encounter. EXAM: CT HEAD WITHOUT CONTRAST CT CERVICAL SPINE WITHOUT CONTRAST TECHNIQUE: Multidetector CT imaging of the head and cervical spine was performed following the standard protocol without intravenous contrast. Multiplanar CT image reconstructions of the cervical spine were also generated. COMPARISON:  Head CT 12/21/2014 FINDINGS: CT HEAD FINDINGS Skull and Sinuses:Negative for fracture or hemo sinus. Benign ground-glass density in the left occipital bone that is small and stable. Visualized orbits: Gaze to the right, nonspecific. No evidence of orbital injury. Brain: No evidence of acute infarction, hemorrhage, hydrocephalus, or mass lesion/mass effect. There is advanced chronic ischemic injury with numerous bilateral cerebellar infarcts, largest area in the inferior left cerebellum. Multiple bilateral subcortical infarcts, most discrete in the right thalamus and caudate head. Extensive fairly symmetric cortical infarct and atrophy, aligned in a watershed distribution. CT CERVICAL SPINE FINDINGS Negative for acute fracture or subluxation. No prevertebral edema. No gross cervical canal hematoma. Diffuse disc  degeneration and spondylotic spurring. No indication of high-grade canal stenosis. IMPRESSION: 1. No evidence of acute intracranial or cervical spine injury. 2. Extensive chronic ischemic injury as described, stable from 12/21/2014 comparison. Electronically Signed   By: Monte Fantasia M.D.   On: 09/08/2015 11:04   Portable Chest 1 View  09/30/2015  CLINICAL DATA:  Health care associated pneumonia. EXAM: PORTABLE CHEST 1 VIEW COMPARISON:  Radiograph of September 29, 2015. FINDINGS: The heart size and mediastinal contours are within normal limits. Both lungs are clear. No pneumothorax or pleural effusion is noted. Left-sided pacemaker is unchanged in position. The visualized skeletal structures are unremarkable. IMPRESSION: No acute cardiopulmonary abnormality seen. Electronically Signed   By: Marijo Conception, M.D.   On: 09/30/2015 10:08   Dg Swallowing Func-speech Pathology  09/30/2015  Objective Swallowing Evaluation: Type of Study: MBS-Modified Barium Swallow Study Patient Details Name: Grant Lara MRN: [id number] Date of Birth: 08-Sep-1940 Today's Date: 09/30/2015 Time: SLP Start Time (ACUTE ONLY): 1100-SLP Stop Time (ACUTE ONLY): 1120 SLP Time Calculation (min) (ACUTE ONLY): 20 min Past Medical History: Past Medical History Diagnosis Date . Hypertension  . Dilated cardiomyopathy ([place])    2/12: EF 30-35%, trivial AI, mild RAE.  EF 2014 40 -45% . CAD (coronary artery disease)    LHC 9/05 with Dr. Einar Gip:  dLM 20-30%, LAD 85%, oD1 20-30%.  PCI:  Taxus DES to LAD; Dx jailed and tx with POBA.  Last myoview 12/10: inf scar, no ischemia, EF 29%. . DVT (deep venous thrombosis) ([place])  . Pulmonary embolus (HCC)    chronic coumadin . 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 PFO (patent foramen ovale)    Not mentioned on 2014 echo. [name] HLD (hyperlipidemia)  . Crohn's disease ([place])  . Nephrolithiasis  . Diabetes mellitus    11/05/11 "borderline; don't take medications" . Stroke [facility]) 1993   "left arm can't hold steady; leg too" . Arthritis     "used to have a touch in my legs" . Memory difficulties  . Swelling of both ankles     09-22-13 occ.feet, but denies pain. . Bradycardia 06/01/2014 . Dementia  . Hyperlipidemia  . CHF (congestive heart failure) ([place])  . Pneumonia ~ 2011   09-22-13 denies any recent SOB or breathing problems . HCAP (healthcare-associated pneumonia) 09/29/2015 . AICD (automatic cardioverter/defibrillator) present  Past Surgical History: Past Surgical History Procedure Laterality Date . Colon surgery  1994; 1996   "for Crohn's disease" . Appendectomy   . Implantable cardioverter defibrillator implant N/A 06/02/2014   Procedure: IMPLANTABLE CARDIOVERTER DEFIBRILLATOR IMPLANT;  Surgeon: [name], MD;  Location: [facility] CATH LAB;  Service: Cardiovascular;  Laterality: N/A; HPI: Grant Lara is a 75 y.o. male who was recently discharged presents via [facility] from [facility] for possible syncopal episode vs seizure. Pt was at PT when he had a 30 second episode of "unresponsiveness" with a right sided gaze. Pt was sitting, no fall or head injury. After episode pt had generalized bilateral weakness. No history of seizure known. Pt responsive to verbal stimuli. GCS reported 13. Follows commands. EMS reported rhonchi in lower bases with nonproductive cough. Oxygen sat in 80s RA on arrival. He has a PMH of dementia, hypertension, hyperlipidemia, diabetes mellitus, depression, CAD, DVT, PE on Xarelto, PFO, chronic disease, stroke, BPH, systolic congestive heart failure (EF 20-25%), who presents with generalized weakness and increased cough, chest congestion and shortness of breath.The patient was evaluated in the ED and found to have an acute leukocytosis, hypoxia and abnormal lung sounds. A CT of the chest was obtained that was significant for multifocal bilateral pneumonia. No Data Recorded Assessment / Plan / Recommendation CHL IP CLINICAL IMPRESSIONS 09/30/2015 Therapy Diagnosis Mild pharyngeal phase dysphagia Clinical  Impression Pt demonstrates mild oropharyngeal dysphagia characterized by delayed swallow initiation with consistent flash penetration of liquids. Laryngeal closure is adequate and airway remained protected throughout assessment despite large consecutive swallows. Recommend pt consume a regular diet and thin liquids. No SLP f/u needed, will sign off.  Impact on safety and function Mild aspiration risk   CHL IP TREATMENT RECOMMENDATION 09/30/2015 Treatment Recommendations No treatment recommended at this time   No flowsheet data found. CHL IP DIET RECOMMENDATION 09/30/2015 SLP Diet Recommendations Regular solids;Thin liquid Liquid Administration via Cup;Straw Medication Administration Whole meds with liquid Compensations Slow rate;Small sips/bites Postural Changes Seated upright at 90 degrees   CHL IP OTHER RECOMMENDATIONS 09/30/2015 Recommended Consults -- Oral Care Recommendations Oral care BID Other Recommendations --   CHL IP FOLLOW UP RECOMMENDATIONS 09/30/2015 Follow up Recommendations None   CHL IP FREQUENCY AND DURATION 04/18/2014 Speech Therapy Frequency (ACUTE ONLY) min 2x/week Treatment Duration --      CHL IP ORAL PHASE 09/30/2015 Oral Phase WFL Oral - Pudding Teaspoon -- Oral - Pudding Cup -- Oral - Honey Teaspoon -- Oral - Honey Cup -- Oral - Nectar Teaspoon -- Oral - Nectar Cup -- Oral - Nectar Straw -- Oral - Thin Teaspoon --  Oral - Thin Cup -- Oral - Thin Straw -- Oral - Puree -- Oral - Mech Soft -- Oral - Regular -- Oral - Multi-Consistency -- Oral - Pill -- Oral Phase - Comment --  CHL IP PHARYNGEAL PHASE 09/30/2015 Pharyngeal Phase Impaired Pharyngeal- Pudding Teaspoon -- Pharyngeal -- Pharyngeal- Pudding Cup -- Pharyngeal -- Pharyngeal- Honey Teaspoon -- Pharyngeal -- Pharyngeal- Honey Cup -- Pharyngeal -- Pharyngeal- Nectar Teaspoon -- Pharyngeal -- Pharyngeal- Nectar Cup -- Pharyngeal -- Pharyngeal- Nectar Straw -- Pharyngeal -- Pharyngeal- Thin Teaspoon -- Pharyngeal -- Pharyngeal- Thin Cup Delayed  swallow initiation-pyriform sinuses;Penetration/Aspiration before swallow;Penetration/Aspiration during swallow Pharyngeal Material enters airway, remains ABOVE vocal cords then ejected out Pharyngeal- Thin Straw Delayed swallow initiation-pyriform sinuses;Penetration/Aspiration before swallow;Penetration/Aspiration during swallow Pharyngeal Material enters airway, remains ABOVE vocal cords then ejected out Pharyngeal- Puree Delayed swallow initiation-pyriform sinuses Pharyngeal Material does not enter airway Pharyngeal- Mechanical Soft -- Pharyngeal -- Pharyngeal- Regular Delayed swallow initiation-pyriform sinuses Pharyngeal -- Pharyngeal- Multi-consistency -- Pharyngeal -- Pharyngeal- Pill Delayed swallow initiation-pyriform sinuses;Penetration/Aspiration before swallow;Penetration/Aspiration during swallow Pharyngeal Material enters airway, remains ABOVE vocal cords then ejected out Pharyngeal Comment --  No flowsheet data found. No flowsheet data found. Grant Lara, [place] CCC-SLP [phone] Lynann Beaver 09/30/2015, 12:49 PM               Time Spent in minutes  25 minutes   Rowe Clack N M.D on 10/03/2015 at 11:24 AM  Between 7am to 7pm - Pager - [phone] After 7pm go to [email or web address] - password [facility]  [facility] -  Office  [phone]

## 2015-10-03 NOTE — Progress Notes (Signed)
Pharmacy Antibiotic Note Grant Lara is a 75 y.o. male admitted on 09/29/2015 with bilateral PNA. Currently on day 4 of Zosyn and vancomycin.  A trough was drawn tonight which is low at 48mg/mL. SCr has been stable (1.1-1.2), no doses have been missed.  Plan: -Increase vancomycin to 1g IV q12h; goal trough 15 - 20. -SCr Q72H minimum while on vancomycin  -Continue extended infusion Zosyn 3.375 grams IV every 8 hours   Weight: 121 lb 11.1 oz (55.2 kg)  Temp (24hrs), Avg:99.8 F (37.7 C), Min:98.3 F (36.8 C), Max:102.1 F (38.9 C)   Recent Labs Lab 09/29/15 1210 09/29/15 1900 09/30/15 0512 10/02/15 0634 10/03/15 1536 10/03/15 1538  WBC 19.2*  --  18.4* 16.1*  --   --   CREATININE 1.61*  --  1.22 1.10  --  1.21  LATICACIDVEN  --  1.7  --   --   --   --   VANCOTROUGH  --   --   --   --  5*  --     Estimated Creatinine Clearance: 40.6 mL/min (by C-G formula based on Cr of 1.21).    No Known Allergies  Antimicrobials this admission: 6/16 Vancomycin  >>  6/16 Zosyn >>   Dose adjustments this admission: 6/20: VT = 5 on 750 q24> dose incr to 1g q12  Microbiology results: 6/16 BCx: ngtd   Thank you for allowing pharmacy to be a part of this patient's care.  Meilin Brosh D. Annis Lagoy, PharmD, BCPS Clinical Pharmacist Pager: [phone]/20/2017 5:07 PM

## 2015-10-03 NOTE — Progress Notes (Signed)
Inpatient Diabetes Program Recommendations  AACE/ADA: New Consensus Statement on Inpatient Glycemic Control (2015)  Target Ranges:  Prepandial:   less than 140 mg/dL      Peak postprandial:   less than 180 mg/dL (1-2 hours)      Critically ill patients:  140 - 180 mg/dL   Results for LIONEL, WOODBERRY (MRN [id number]) as of 10/03/2015 09:54  Ref. Range 10/02/2015 07:52 10/02/2015 11:55 10/02/2015 17:06 10/02/2015 21:49  Glucose-Capillary Latest Ref Range: 65-99 mg/dL 114 (H) 287 (H) 107 (H) 242 (H)    Admit with: Pneumonia  History: DM, Dementia, CVA  Home DM Meds: None listed  Current Insulin Orders: Novolog Sensitive Correction Scale/ SSI (0-9 units) TID AC       MD- Please consider the following in-hospital insulin adjustments:  Start low dose Novolog Meal Coverage- Novolog 3 units tid with meals (hold if pt eats <50% of meal)     --Will follow patient during hospitalization--  Wyn Quaker RN, MSN, CDE Diabetes Coordinator Inpatient Glycemic Control Team Team Pager: [phone] (8a-5p)

## 2015-10-03 NOTE — Progress Notes (Signed)
Occupational Therapy Treatment Patient Details Name: Grant Lara MRN: [id number] DOB: 07/16/1940 Today's Date: 10/03/2015    History of present illness Grant Lara is a 75 y.o. male who was recently discharged presents via [facility] from [facility] for possible syncopal episode vs seizure. Pt was at PT when he had a 30 second episode of "unresponsiveness" with a right sided gaze. Pt was sitting, no fall or head injury. After episode pt had generalized bilateral weakness. No history of seizure known. Pt responsive to verbal stimuli. GCS reported 13. Follows commands. EMS reported rhonchi in lower bases with nonproductive cough. Oxygen sat in 80s RA on arrival. He has a PMH of dementia, hypertension, hyperlipidemia, diabetes mellitus, depression, CAD, DVT, PE on Xarelto, PFO, chronic disease, stroke, BPH, systolic congestive heart failure (EF 20-25%), who presents with generalized weakness and increased cough, chest congestion and shortness of breath.The patient was evaluated in the ED and found to have an acute leukocytosis, hypoxia and abnormal lung sounds. A CT of the chest was obtained that was significant for multifocal bilateral pneumonia.    OT comments  Pt requiring more assistance today. He appears more confused, requiring more cues for sequencing ADLs and use of walker.  Required min A for simple grooming and functional transfers.  Pt grimacing and guarding during session, indicates pain, but is unable to provide details.  RN notified.   Follow Up Recommendations  SNF    Equipment Recommendations  None recommended by OT    Recommendations for Other Services      Precautions / Restrictions Precautions Precautions: Fall       Mobility Bed Mobility Overal bed mobility: Needs Assistance Bed Mobility: Supine to Sit     Supine to sit: Mod assist     General bed mobility comments: Pt very slow to initiate today.  Required mod A to move LEs off EOB    Transfers Overall transfer level: Needs assistance Equipment used: Rolling walker (2 wheeled) Transfers: Sit to/from [facility] Sit to Stand: Min assist Stand pivot transfers: Min assist       General transfer comment: Requires min A to move into standing and min A for balance.  He moves very slowly, and requires max cues for use of walker     Balance Overall balance assessment: Needs assistance Sitting-balance support: Feet supported Sitting balance-Leahy Scale: Poor     Standing balance support: Single extremity supported;During functional activity Standing balance-Leahy Scale: Poor Standing balance comment: requires min A                    ADL Overall ADL's : Needs assistance/impaired     Grooming: Wash/dry face;Oral care;Minimal assistance;Standing Grooming Details (indicate cue type and reason): Pt requires assist to initiate activity and for standing balance.  Mod cues for sequencing `                 Toilet Transfer: Minimal assistance;BSC;RW;Ambulation;Comfort height toilet           Functional mobility during ADLs: Minimal assistance;Rolling walker        Vision                     Perception     Praxis      Cognition   Behavior During Therapy: Agitated Overall Cognitive Status: History of cognitive impairments - at baseline  Extremity/Trunk Assessment               Exercises     Shoulder Instructions       General Comments      Pertinent Vitals/ Pain       Pain Assessment: Faces Faces Pain Scale: Hurts even more Pain Location: unable to determine  Pain Descriptors / Indicators: Grimacing;Guarding Pain Intervention(s): Monitored during session;Limited activity within patient's tolerance;Repositioned  Home Living                                          Prior Functioning/Environment              Frequency Min 2X/week     Progress  Toward Goals  OT Goals(current goals can now be found in the care plan section)  Progress towards OT goals: Not progressing toward goals - comment (more lethargic and requires more assist )  ADL Goals Pt Will Perform Grooming: with min assist;standing Pt Will Perform Upper Body Bathing: with min assist;sitting Pt Will Perform Lower Body Bathing: with mod assist;sit to/from stand Pt Will Transfer to Toilet: ambulating;regular height toilet;bedside commode;grab bars;with min assist Pt Will Perform Toileting - Clothing Manipulation and hygiene: with mod assist;sit to/from stand  Plan Discharge plan remains appropriate    Co-evaluation                 End of Session Equipment Utilized During Treatment: Rolling walker   Activity Tolerance Patient limited by lethargy;Patient limited by pain   Patient Left in chair;with call bell/phone within reach;with chair alarm set   Nurse Communication Mobility status        Time: 1445-1510 OT Time Calculation (min): 25 min  Charges: OT General Charges $OT Visit: 1 Procedure OT Treatments $Self Care/Home Management : 23-37 mins  Grant Lara 10/03/2015, 3:13 PM

## 2015-10-03 NOTE — Progress Notes (Signed)
CSW is continuing to follow for pt return to [facility] when stable  Domenica Reamer, [place] Social Worker [phone]

## 2015-10-04 LAB — LEGIONELLA PNEUMOPHILA SEROGP 1 UR AG: L. PNEUMOPHILA SEROGP 1 UR AG: NEGATIVE

## 2015-10-04 LAB — GLUCOSE, CAPILLARY
GLUCOSE-CAPILLARY: 128 mg/dL — AB (ref 65–99)
Glucose-Capillary: 184 mg/dL — ABNORMAL HIGH (ref 65–99)

## 2015-10-04 MED ORDER — AMOXICILLIN-POT CLAVULANATE 875-125 MG PO TABS: 1.0000 | ORAL_TABLET | Freq: Two times a day (BID) | ORAL | Status: DC

## 2015-10-04 MED ORDER — ALPRAZOLAM 0.5 MG PO TABS: 0.5000 mg | ORAL_TABLET | Freq: Every evening | ORAL | Status: DC | PRN

## 2015-10-04 MED ORDER — LEVOFLOXACIN 750 MG PO TABS: 750.0000 mg | ORAL_TABLET | Freq: Every day | ORAL | Status: DC

## 2015-10-04 MED ORDER — ENSURE ENLIVE PO LIQD: 237.0000 mL | Freq: Two times a day (BID) | ORAL | Status: DC

## 2015-10-04 NOTE — Progress Notes (Signed)
Patient will discharge to [facility] Anticipated discharge date: 6/21 Family notified: wife Hamilton Capri by [employer]- scheduled for 3pm Report #: [phone]  [place] signing off.  Domenica Reamer, [place] Social Worker [phone]

## 2015-10-04 NOTE — Progress Notes (Signed)
Report called to Juliann Pulse, receiving nurse at [facility]. All questions answered. Pt will be transported via [facility].

## 2015-10-04 NOTE — Progress Notes (Signed)
Nutrition Follow-up  DOCUMENTATION CODES:   Not applicable  INTERVENTION:   -Continue Ensure Enlive po BID, each supplement provides 350 kcal and 20 grams of protein  NUTRITION DIAGNOSIS:   Increased nutrient needs related to acute illness as evidenced by estimated needs.  Progressing  GOAL:   Patient will meet greater than or equal to 90% of their needs  Met  MONITOR:   PO intake, Supplement acceptance  REASON FOR ASSESSMENT:   Consult  ("Severe malnutrition")  ASSESSMENT:   Grant Lara is a 75 y.o. male who was recently discharged presents via [facility] from [place] for possible syncopal episode vs seizure.  Pt underwent MBSS on 09/30/15; pt found to have mild dysphagia and recommended a regular consistency diet with thin liquids.   Spoke with pt at bedside. He reports he feels "terrible". Pt reveals his appetite continues to be good and he consumed 100% of his breakfast this morning. He shares that he has been consuming most of the food off his trays during hospitalization. Meal completion 75-100%.   Pt confirms he is consuming the Ensure supplements and would like to continue taking them. Discussed importance of good PO intake and supplement consumption to promote healing.   Labs reviewed: CGS: 168-198. Glucose: 207.   Diet Order:  Diet heart healthy/carb modified Room service appropriate?: Yes; Fluid consistency:: Thin  Skin:  Reviewed, no issues  Last BM:  10/01/15  Height:   Ht Readings from Last 1 Encounters:  09/13/15 _0  (1.6 m)    Weight:   Wt Readings from Last 1 Encounters:  10/04/15 125 lb (56.7 kg)    Ideal Body Weight:  56.36 kg  BMI:  Body mass index is 22.15 kg/(m^2).  Estimated Nutritional Needs:   Kcal:  1600-1800 (29-33 kcal/kg bw)  Protein:  66-77 g pro (1.2-1.4g/kg bw)  Fluid:  >1.7 liters  EDUCATION NEEDS:   No education needs identified at this time  Jaden Abreu A. Jimmye Norman, RD, LDN, CDE Pager:  [phone] After hours Pager: [phone]

## 2015-10-04 NOTE — Discharge Summary (Signed)
Physician Discharge Summary  Grant Lara LTR:[id number] DOB: 1941/01/21 DOA: 09/29/2015  PCP: Grant Austria, MD  Admit date: 09/29/2015 Discharge date: 10/04/2015  Admitted From: SNF Disposition: SNF.  Recommendations for Outpatient Follow-up:  1. Follow up with PCP in 1-2 weeks 2. Please obtain BMP/CBC in 2 DAYS to check wbc count is trending down. 3. Please follow up with SLP on discharge.     Discharge Condition:guarded.  CODE STATUS:FULL.  Diet recommendation: carb modified with thin liquids.   Brief/Interim Summary: Grant Lara is a 75 y.o. male who was recently discharged presents via [facility] from [facility] presents for possible presyncopal episode vs seizure. He was found to have multilobar pneumonia, possibly aspiration.   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)   Dilated cardiomyopathy (HCC)   Chronic systolic heart failure (HCC)   COPD (chronic obstructive pulmonary disease) (HCC)   History of embolic stroke   Crohn's disease ([place])   Hyperglycemia   DM2 (diabetes mellitus, type 2) (HCC)   Hx pulmonary embolism   Leukocytosis   Anemia, chronic disease   Aphasia   CKD (chronic kidney disease) stage 3, GFR 30-59 ml/min   Coronary artery disease involving native coronary artery of native heart without angina pectoris   Late effects of CVA (cerebrovascular accident)   Cardiac syncope   Chronic systolic CHF (congestive heart failure) (HCC)   Generalized weakness   Protein-calorie malnutrition, severe (HCC)   Vascular dementia without behavioral disturbance   ICD (implantable cardioverter-defibrillator) in place   Hypoxia  Aspiration Pneumonia/ health care associated pneumonia: Admitted for IV antibiotics, was on broad spectrum antibiotics for 6 days, . No fever and leukocytosis is improving.  Recommend repeating CT chest or cxr in 2 weeks to evaluate for resolution of the pneumonia.  Blood cultures have been negative till date.  Urine for strep pneumon urinary antigen is negative.    Diabetes mellitus: CBG (last 3)   Recent Labs  10/03/15 2157 10/04/15 0741 10/04/15 1229  GLUCAP 163* 128* 184*    Resume SSI.    SEVERE protein energy malnutrition: Dietician consulted and recommendations given.    Chronic systolic heart failure: Compensated.    Anemia of chronic disease:  baseline hemoglobin around 8.  Hemoglobin today is 8.4  Discharge Instructions      Discharge Instructions    Discharge instructions    Complete by:  As directed   Please obtain a CXR in 2 weeks to evaluate for resolution of the pneumonia.  Please check CBC in 2 days to make sure the wbc count is trending down.            Medication List    STOP taking these medications        senna-docusate 8.6-50 MG tablet  Commonly known as:  Senokot-S      TAKE these medications        acetaminophen 500 MG tablet  Commonly known as:  TYLENOL  Take 2 tablets (1,000 mg total) by mouth 2 (two) times daily.     ALPRAZolam 0.5 MG tablet  Commonly known as:  XANAX  Take 1 tablet (0.5 mg total) by mouth at bedtime as needed.     aspirin 81 MG tablet  Take 81 mg by mouth daily.     atorvastatin 10 MG tablet  Commonly known as:  LIPITOR  Take 1 tablet (10  mg total) by mouth daily at 6 PM.     donepezil 10 MG tablet  Commonly known as:  ARICEPT  Take 10 mg by mouth at bedtime.     feeding supplement (ENSURE ENLIVE) Liqd  Take 237 mLs by mouth 2 (two) times daily between meals.     latanoprost 0.005 % ophthalmic solution  Commonly known as:  XALATAN  Place 1 drop into both eyes at bedtime.     levalbuterol 0.63 MG/3ML nebulizer solution  Commonly known as:  XOPENEX  Take 3 mLs (0.63 mg total) by nebulization every 6 (six) hours as needed for wheezing or shortness of breath.      levofloxacin 750 MG tablet  Commonly known as:  LEVAQUIN  Take 1 tablet (750 mg total) by mouth daily.     loratadine 10 MG tablet  Commonly known as:  CLARITIN  Take 10 mg by mouth daily as needed for allergies.     rivaroxaban 20 MG Tabs tablet  Commonly known as:  XARELTO  Take 1 tablet (20 mg total) by mouth daily with supper.     senna 8.6 MG Tabs tablet  Commonly known as:  SENOKOT  Take 1 tablet by mouth every morning.     sertraline 50 MG tablet  Commonly known as:  ZOLOFT  Take 50 mg by mouth every morning.     tamsulosin 0.4 MG Caps capsule  Commonly known as:  FLOMAX  Take 1 capsule (0.4 mg total) by mouth daily.       Follow-up Information    Follow up with Grant Austria, MD. Schedule an appointment as soon as possible for a visit in 1 week.   Specialty:  Family Medicine   Contact information:   [place] [address] [phone]      No Known Allergies  Consultations:  SLP eval.   Procedures/Studies: Dg Chest 2 View  09/29/2015  CLINICAL DATA:  Hypoxia EXAM: CHEST  2 VIEW COMPARISON:  09/08/2015 FINDINGS: Cardiac shadow is stable. Defibrillator is again noted. Lungs are well aerated bilaterally. No focal infiltrate or effusion is noted. No bony abnormality is seen. IMPRESSION: No active cardiopulmonary disease. Electronically Signed   By: Inez Catalina M.D.   On: 09/29/2015 13:49   Dg Chest 2 View  09/08/2015  CLINICAL DATA:  Shortness of breath and weakness. EXAM: CHEST  2 VIEW COMPARISON:  12/21/2014 FINDINGS: Left-sided pacemaker unchanged. Lungs are hyperexpanded without focal consolidation or effusion. Cardiomediastinal silhouette is within normal. Remainder of the exam is unchanged. IMPRESSION: No acute cardiopulmonary disease. COPD. Electronically Signed   By: Marin Olp M.D.   On: 09/08/2015 11:20   Abd 1 View (kub)  09/08/2015  CLINICAL DATA:  75 year old male with diabetes, high blood pressure and  hyperlipidemia presenting with generalized weakness and increased urinary frequency. On witnessed fall. Initial encounter. EXAM: ABDOMEN - 1 VIEW COMPARISON:  05/02/2014 and 07/16/2013. FINDINGS: No evidence of bowel obstruction. The possibility of free intraperitoneal air cannot be assessed on a supine view. No acute fracture noted. Vascular calcifications. IMPRESSION: No evidence of bowel obstruction. No acute fracture noted. Electronically Signed   By: Genia Del M.D.   On: 09/08/2015 23:37   Ct Head Wo Contrast  09/29/2015  CLINICAL DATA:  Possible seizure today. The patient was unresponsive for 30 seconds. Initial encounter. EXAM: CT HEAD WITHOUT CONTRAST TECHNIQUE: Contiguous axial images were obtained from the base of the skull through the vertex without intravenous contrast. COMPARISON:  Head CT scan 09/08/2015 and 12/21/2014. FINDINGS: Atrophy and extensive chronic microvascular ischemic change are again seen. Bilateral cerebellar infarcts, most conspicuous in the left cerebellar hemisphere and bilateral subcortical infarcts best seen in the right caudate and thalamus are again identified. Anterior and posterior watershed territory infarcts bilaterally are also again identified. No evidence of acute abnormality including hemorrhage, infarct, mass lesion, mass effect, midline shift or abnormal extra-axial fluid collection. No hydrocephalus or pneumocephalus. The calvarium is intact. Imaged paranasal sinuses mastoid air cells are clear. IMPRESSION: No acute abnormality. Extensive atrophy and multiple remote infarctions as seen on prior exams. Electronically Signed   By: Inge Rise M.D.   On: 09/29/2015 13:14   Ct Head Wo Contrast  09/08/2015  CLINICAL DATA:  Unwitnessed fall at nursing home. Initial encounter. EXAM: CT HEAD WITHOUT CONTRAST CT CERVICAL SPINE WITHOUT CONTRAST TECHNIQUE: Multidetector CT imaging of the head and cervical spine was performed following the standard protocol without  intravenous contrast. Multiplanar CT image reconstructions of the cervical spine were also generated. COMPARISON:  Head CT 12/21/2014 FINDINGS: CT HEAD FINDINGS Skull and Sinuses:Negative for fracture or hemo sinus. Benign ground-glass density in the left occipital bone that is small and stable. Visualized orbits: Gaze to the right, nonspecific. No evidence of orbital injury. Brain: No evidence of acute infarction, hemorrhage, hydrocephalus, or mass lesion/mass effect. There is advanced chronic ischemic injury with numerous bilateral cerebellar infarcts, largest area in the inferior left cerebellum. Multiple bilateral subcortical infarcts, most discrete in the right thalamus and caudate head. Extensive fairly symmetric cortical infarct and atrophy, aligned in a watershed distribution. CT CERVICAL SPINE FINDINGS Negative for acute fracture or subluxation. No prevertebral edema. No gross cervical canal hematoma. Diffuse disc degeneration and spondylotic spurring. No indication of high-grade canal stenosis. IMPRESSION: 1. No evidence of acute intracranial or cervical spine injury. 2. Extensive chronic ischemic injury as described, stable from 12/21/2014 comparison. Electronically Signed   By: Monte Fantasia M.D.   On: 09/08/2015 11:04   Ct Chest Wo Contrast  09/29/2015  CLINICAL DATA:  75 year old male with a cough for the past 2 days. History of CHF. EXAM: CT CHEST WITHOUT CONTRAST TECHNIQUE: Multidetector CT imaging of the chest was performed following the standard protocol without IV contrast. COMPARISON:  Chest x-ray obtained earlier today ; prior chest CT 04/18/2014 FINDINGS: Mediastinum: Unremarkable CT appearance of the thyroid gland. No suspicious mediastinal or hilar adenopathy. No soft tissue mediastinal mass. The thoracic esophagus is unremarkable. Heart/Vascular: Limited evaluation in the absence of intravenous contrast. Left subclavian approach cardiac rhythm maintenance device with leads terminating  in the right atrium and right ventricular apex. The heart is normal in size. No pericardial effusion. Atherosclerotic calcifications present throughout the coronary arteries. No pericardial effusion. Lungs/Pleura: Stable 2 cm thin walled cystic structure with adjacent pleural parenchymal scarring in the right upper lobe. Mild background centrilobular emphysema. Interval development of patchy ground-glass attenuation airspace opacities in a peribronchovascular distribution in the right greater than left lower lobes. Findings superimposed on a background of chronic scarring. Bones/Soft Tissues: No acute fracture or aggressive appearing lytic or blastic osseous lesion. Upper Abdomen: Multiple hypoechoic lesions throughout both kidneys which are incompletely characterized in the absence of intravenous contrast material. However, prior imaging demonstrated multiple simple cysts. No nephrolithiasis visualized. Upper abdominal organs are unremarkable. IMPRESSION: 1. Findings most consistent with an infectious/ inflammatory process involving the bilateral lower lobes. Given the distribution, aspiration is a consideration as is multilobar pneumonia. 2. Coronary artery calcifications. 3. Bilateral renal  cysts. Electronically Signed   By: Jacqulynn Cadet M.D.   On: 09/29/2015 16:00   Ct Cervical Spine Wo Contrast  09/08/2015  CLINICAL DATA:  Unwitnessed fall at nursing home. Initial encounter. EXAM: CT HEAD WITHOUT CONTRAST CT CERVICAL SPINE WITHOUT CONTRAST TECHNIQUE: Multidetector CT imaging of the head and cervical spine was performed following the standard protocol without intravenous contrast. Multiplanar CT image reconstructions of the cervical spine were also generated. COMPARISON:  Head CT 12/21/2014 FINDINGS: CT HEAD FINDINGS Skull and Sinuses:Negative for fracture or hemo sinus. Benign ground-glass density in the left occipital bone that is small and stable. Visualized orbits: Gaze to the right, nonspecific. No  evidence of orbital injury. Brain: No evidence of acute infarction, hemorrhage, hydrocephalus, or mass lesion/mass effect. There is advanced chronic ischemic injury with numerous bilateral cerebellar infarcts, largest area in the inferior left cerebellum. Multiple bilateral subcortical infarcts, most discrete in the right thalamus and caudate head. Extensive fairly symmetric cortical infarct and atrophy, aligned in a watershed distribution. CT CERVICAL SPINE FINDINGS Negative for acute fracture or subluxation. No prevertebral edema. No gross cervical canal hematoma. Diffuse disc degeneration and spondylotic spurring. No indication of high-grade canal stenosis. IMPRESSION: 1. No evidence of acute intracranial or cervical spine injury. 2. Extensive chronic ischemic injury as described, stable from 12/21/2014 comparison. Electronically Signed   By: Monte Fantasia M.D.   On: 09/08/2015 11:04   Portable Chest 1 View  09/30/2015  CLINICAL DATA:  Health care associated pneumonia. EXAM: PORTABLE CHEST 1 VIEW COMPARISON:  Radiograph of September 29, 2015. FINDINGS: The heart size and mediastinal contours are within normal limits. Both lungs are clear. No pneumothorax or pleural effusion is noted. Left-sided pacemaker is unchanged in position. The visualized skeletal structures are unremarkable. IMPRESSION: No acute cardiopulmonary abnormality seen. Electronically Signed   By: Marijo Conception, M.D.   On: 09/30/2015 10:08   Dg Swallowing Func-speech Pathology  09/30/2015  Objective Swallowing Evaluation: Type of Study: MBS-Modified Barium Swallow Study Patient Details Name: CAPRI RABEN MRN: [id number] Date of Birth: 1940/09/30 Today's Date: 09/30/2015 Time: SLP Start Time (ACUTE ONLY): 1100-SLP Stop Time (ACUTE ONLY): 1120 SLP Time Calculation (min) (ACUTE ONLY): 20 min Past Medical History: Past Medical History Diagnosis Date . Hypertension  . Dilated cardiomyopathy ([place])    2/12: EF 30-35%, trivial AI, mild RAE.  EF 2014 40  -45% . CAD (coronary artery disease)    LHC 9/05 with Dr. Einar Gip:  dLM 20-30%, LAD 85%, oD1 20-30%.  PCI:  Taxus DES to LAD; Dx jailed and tx with POBA.  Last myoview 12/10: inf scar, no ischemia, EF 29%. . DVT (deep venous thrombosis) ([place])  . Pulmonary embolus (HCC)    chronic coumadin . 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 PFO (patent foramen ovale)    Not mentioned on 2014 echo. [name] HLD (hyperlipidemia)  . Crohn's disease ([place])  . Nephrolithiasis  . Diabetes mellitus    11/05/11 "borderline; don't take medications" . Stroke [facility]) 1993   "left arm can't hold steady; leg too" . Arthritis    "used to have a touch in my legs" . Memory difficulties  . Swelling of both ankles     09-22-13 occ.feet, but denies pain. . Bradycardia 06/01/2014 . Dementia  . Hyperlipidemia  . CHF (congestive heart failure) ([place])  . Pneumonia ~ 2011   09-22-13 denies any recent SOB or breathing problems . HCAP (healthcare-associated pneumonia) 09/29/2015 . AICD (automatic cardioverter/defibrillator) present  Past Surgical History: Past Surgical History Procedure Laterality Date .  Colon surgery  1994; 1996   "for Crohn's disease" . Appendectomy   . Implantable cardioverter defibrillator implant N/A 06/02/2014   Procedure: IMPLANTABLE CARDIOVERTER DEFIBRILLATOR IMPLANT;  Surgeon: [name], MD;  Location: [facility] C[facility] LAB;  Service: Cardiovascular;  Laterality: N/A; HPI: Liban Guedes Balik is a 75 y.o. male who was recently discharged presents via [facility] from [facility] for possible syncopal episode vs seizure. Pt was at PT when he had a 30 second episode of "unresponsiveness" with a right sided gaze. Pt was sitting, no fall or head injury. After episode pt had generalized bilateral weakness. No history of seizure known. Pt responsive to verbal stimuli. GCS reported 13. Follows commands. EMS reported rhonchi in lower bases with nonproductive cough. Oxygen sat in 80s RA on arrival. He has a PMH of dementia, hypertension, hyperlipidemia,  diabetes mellitus, depression, CAD, DVT, PE on Xarelto, PFO, chronic disease, stroke, BPH, systolic congestive heart failure (EF 20-25%), who presents with generalized weakness and increased cough, chest congestion and shortness of breath.The patient was evaluated in the ED and found to have an acute leukocytosis, hypoxia and abnormal lung sounds. A CT of the chest was obtained that was significant for multifocal bilateral pneumonia. No Data Recorded Assessment / Plan / Recommendation CHL IP CLINICAL IMPRESSIONS 09/30/2015 Therapy Diagnosis Mild pharyngeal phase dysphagia Clinical Impression Pt demonstrates mild oropharyngeal dysphagia characterized by delayed swallow initiation with consistent flash penetration of liquids. Laryngeal closure is adequate and airway remained protected throughout assessment despite large consecutive swallows. Recommend pt consume a regular diet and thin liquids. No SLP f/u needed, will sign off.  Impact on safety and function Mild aspiration risk   CHL IP TREATMENT RECOMMENDATION 09/30/2015 Treatment Recommendations No treatment recommended at this time   No flowsheet data found. CHL IP DIET RECOMMENDATION 09/30/2015 SLP Diet Recommendations Regular solids;Thin liquid Liquid Administration via Cup;Straw Medication Administration Whole meds with liquid Compensations Slow rate;Small sips/bites Postural Changes Seated upright at 90 degrees   CHL IP OTHER RECOMMENDATIONS 09/30/2015 Recommended Consults -- Oral Care Recommendations Oral care BID Other Recommendations --   CHL IP FOLLOW UP RECOMMENDATIONS 09/30/2015 Follow up Recommendations None   CHL IP FREQUENCY AND DURATION 04/18/2014 Speech Therapy Frequency (ACUTE ONLY) min 2x/week Treatment Duration --      CHL IP ORAL PHASE 09/30/2015 Oral Phase WFL Oral - Pudding Teaspoon -- Oral - Pudding Cup -- Oral - Honey Teaspoon -- Oral - Honey Cup -- Oral - Nectar Teaspoon -- Oral - Nectar Cup -- Oral - Nectar Straw -- Oral - Thin Teaspoon -- Oral  - Thin Cup -- Oral - Thin Straw -- Oral - Puree -- Oral - Mech Soft -- Oral - Regular -- Oral - Multi-Consistency -- Oral - Pill -- Oral Phase - Comment --  CHL IP PHARYNGEAL PHASE 09/30/2015 Pharyngeal Phase Impaired Pharyngeal- Pudding Teaspoon -- Pharyngeal -- Pharyngeal- Pudding Cup -- Pharyngeal -- Pharyngeal- Honey Teaspoon -- Pharyngeal -- Pharyngeal- Honey Cup -- Pharyngeal -- Pharyngeal- Nectar Teaspoon -- Pharyngeal -- Pharyngeal- Nectar Cup -- Pharyngeal -- Pharyngeal- Nectar Straw -- Pharyngeal -- Pharyngeal- Thin Teaspoon -- Pharyngeal -- Pharyngeal- Thin Cup Delayed swallow initiation-pyriform sinuses;Penetration/Aspiration before swallow;Penetration/Aspiration during swallow Pharyngeal Material enters airway, remains ABOVE vocal cords then ejected out Pharyngeal- Thin Straw Delayed swallow initiation-pyriform sinuses;Penetration/Aspiration before swallow;Penetration/Aspiration during swallow Pharyngeal Material enters airway, remains ABOVE vocal cords then ejected out Pharyngeal- Puree Delayed swallow initiation-pyriform sinuses Pharyngeal Material does not enter airway Pharyngeal- Mechanical Soft -- Pharyngeal -- Pharyngeal- Regular Delayed swallow  initiation-pyriform sinuses Pharyngeal -- Pharyngeal- Multi-consistency -- Pharyngeal -- Pharyngeal- Pill Delayed swallow initiation-pyriform sinuses;Penetration/Aspiration before swallow;Penetration/Aspiration during swallow Pharyngeal Material enters airway, remains ABOVE vocal cords then ejected out Pharyngeal Comment --  No flowsheet data found. No flowsheet data found. Herbie [place], [place] CCC-SLP [phone] Grant Lara 09/30/2015, 12:49 PM                 Subjective: No new complaints.   Discharge Exam: Filed Vitals:   10/03/15 2158 10/04/15 0604  BP: 119/59 121/68  Pulse: 67 65  Temp: 98.6 F (37 C) 97.9 F (36.6 C)  Resp: 16 16   Filed Vitals:   10/03/15 0531 10/03/15 1429 10/03/15 2158 10/04/15 0604  BP: 131/60 113/59  119/59 121/68  Pulse: 112 72 67 65  Temp: 99 F (37.2 C) 98.3 F (36.8 C) 98.6 F (37 C) 97.9 F (36.6 C)  TempSrc:      Resp: 16 16 16 16   Weight: 55.2 kg (121 lb 11.1 oz)   56.7 kg (125 lb)  SpO2: 100% 100% 100% 100%    General: Pt is alert, awake, not in acute distress Cardiovascular: RRR, S1/S2 +, no rubs, no gallops Respiratory: CTA bilaterally, no wheezing, no rhonchi Abdominal: Soft, NT, ND, bowel sounds + Extremities: no edema, no cyanosis    The results of significant diagnostics from this hospitalization (including imaging, microbiology, ancillary and laboratory) are listed below for reference.     Microbiology: Recent Results (from the past 240 hour(s))  Culture, blood (Routine X 2) w Reflex to ID Panel     Status: None (Preliminary result)   Collection Time: 10/01/15  6:42 PM  Result Value Ref Range Status   Specimen Description BLOOD RIGHT ARM  Final   Special Requests BOTTLES DRAWN AEROBIC ONLY 5CC  Final   Culture NO GROWTH 2 DAYS  Final   Report Status PENDING  Incomplete  Culture, blood (Routine X 2) w Reflex to ID Panel     Status: None (Preliminary result)   Collection Time: 10/01/15  6:50 PM  Result Value Ref Range Status   Specimen Description BLOOD RIGHT HAND  Final   Special Requests IN PEDIATRIC BOTTLE 2CC  Final   Culture NO GROWTH 2 DAYS  Final   Report Status PENDING  Incomplete     Labs: BNP (last 3 results)  Recent Labs  10/31/14 0140 09/29/15 1210  BNP 42.0 96.2   Basic Metabolic Panel:  Recent Labs Lab 09/29/15 1210 09/30/15 0512 10/02/15 0634 10/03/15 1538  NA 136 135 137 135  K 4.0 4.0 4.3 4.0  CL 105 101 104 103  CO2 23 25 26 25   GLUCOSE 126* 103* 114* 207*  BUN 23* 20 18 21*  CREATININE 1.61* 1.22 1.10 1.21  CALCIUM 8.7* 8.8* 8.8* 8.7*   Liver Function Tests:  Recent Labs Lab 09/30/15 0512  AST 19  ALT 37  ALKPHOS 109  BILITOT 1.0  PROT 6.3*  ALBUMIN 2.5*   No results for input(s): LIPASE, AMYLASE in  the last 168 hours. No results for input(s): AMMONIA in the last 168 hours. CBC:  Recent Labs Lab 09/29/15 1210 09/30/15 0512 10/02/15 0634  WBC 19.2* 18.4* 16.1*  NEUTROABS 15.6*  --   --   HGB 9.2* 8.4* 8.2*  HCT 32.1* 29.6* 29.2*  MCV 73.6* 72.2* 72.6*  PLT 309 332 371   Cardiac Enzymes: No results for input(s): CKTOTAL, CKMB, CKMBINDEX, TROPONINI in the last 168 hours. BNP: Invalid input(s): POCBNP CBG:  Recent Labs Lab 10/03/15 1154 10/03/15 1653 10/03/15 2157 10/04/15 0741 10/04/15 1229  GLUCAP 268* 198* 163* 128* 184*   D-Dimer No results for input(s): DDIMER in the last 72 hours. Hgb A1c No results for input(s): HGBA1C in the last 72 hours. Lipid Profile No results for input(s): CHOL, HDL, LDLCALC, TRIG, CHOLHDL, LDLDIRECT in the last 72 hours. Thyroid function studies No results for input(s): TSH, T4TOTAL, T3FREE, THYROIDAB in the last 72 hours.  Invalid input(s): FREET3 Anemia work up No results for input(s): VITAMINB12, FOLATE, FERRITIN, TIBC, IRON, RETICCTPCT in the last 72 hours. Urinalysis    Component Value Date/Time   COLORURINE YELLOW 09/29/2015 1508   APPEARANCEUR CLEAR 09/29/2015 1508   LABSPEC 1.019 09/29/2015 1508   PHURINE 6.0 09/29/2015 1508   GLUCOSEU NEGATIVE 09/29/2015 1508   HGBUR NEGATIVE 09/29/2015 1508   BILIRUBINUR NEGATIVE 09/29/2015 1508   KETONESUR NEGATIVE 09/29/2015 1508   PROTEINUR NEGATIVE 09/29/2015 1508   UROBILINOGEN 1.0 12/21/2014 1520   NITRITE NEGATIVE 09/29/2015 1508   LEUKOCYTESUR SMALL* 09/29/2015 1508   Sepsis Labs Invalid input(s): PROCALCITONIN,  WBC,  LACTICIDVEN Microbiology Recent Results (from the past 240 hour(s))  Culture, blood (Routine X 2) w Reflex to ID Panel     Status: None (Preliminary result)   Collection Time: 10/01/15  6:42 PM  Result Value Ref Range Status   Specimen Description BLOOD RIGHT ARM  Final   Special Requests BOTTLES DRAWN AEROBIC ONLY 5CC  Final   Culture NO GROWTH 2 DAYS   Final   Report Status PENDING  Incomplete  Culture, blood (Routine X 2) w Reflex to ID Panel     Status: None (Preliminary result)   Collection Time: 10/01/15  6:50 PM  Result Value Ref Range Status   Specimen Description BLOOD RIGHT HAND  Final   Special Requests IN PEDIATRIC BOTTLE 2CC  Final   Culture NO GROWTH 2 DAYS  Final   Report Status PENDING  Incomplete     Time coordinating discharge: Over 30 minutes  SIGNED:   Hosie Poisson, MD  [facility] 10/04/2015, 1:52 PM Pager [phone]  If 7PM-7AM, please contact night-coverage [email or web address] Password [email or web address]

## 2015-10-05 ENCOUNTER — Emergency Department (HOSPITAL_COMMUNITY): Payer: Medicare Other

## 2015-10-05 ENCOUNTER — Encounter: Payer: Self-pay | Admitting: Internal Medicine

## 2015-10-05 ENCOUNTER — Non-Acute Institutional Stay (SKILLED_NURSING_FACILITY): Payer: Medicare Other | Admitting: Internal Medicine

## 2015-10-05 ENCOUNTER — Other Ambulatory Visit: Payer: Self-pay

## 2015-10-05 ENCOUNTER — Observation Stay (HOSPITAL_COMMUNITY)
Admission: EM | Admit: 2015-10-05 | Discharge: 2015-10-09 | Disposition: A | Discharge status: Skilled Nursing Facility | Payer: Medicare Other | Attending: Internal Medicine | Admitting: Internal Medicine

## 2015-10-05 DIAGNOSIS — Z95810 Presence of automatic (implantable) cardiac defibrillator: Secondary | ICD-10-CM | POA: Diagnosis not present

## 2015-10-05 DIAGNOSIS — K5090 Crohn's disease, unspecified, without complications: Secondary | ICD-10-CM | POA: Insufficient documentation

## 2015-10-05 DIAGNOSIS — N183 Chronic kidney disease, stage 3 unspecified: Secondary | ICD-10-CM | POA: Diagnosis present

## 2015-10-05 DIAGNOSIS — E43 Unspecified severe protein-calorie malnutrition: Secondary | ICD-10-CM | POA: Insufficient documentation

## 2015-10-05 DIAGNOSIS — D509 Iron deficiency anemia, unspecified: Secondary | ICD-10-CM | POA: Diagnosis not present

## 2015-10-05 DIAGNOSIS — F0280 Dementia in other diseases classified elsewhere without behavioral disturbance: Secondary | ICD-10-CM | POA: Diagnosis present

## 2015-10-05 DIAGNOSIS — Z8669 Personal history of other diseases of the nervous system and sense organs: Secondary | ICD-10-CM | POA: Diagnosis not present

## 2015-10-05 DIAGNOSIS — E785 Hyperlipidemia, unspecified: Secondary | ICD-10-CM

## 2015-10-05 DIAGNOSIS — F0150 Vascular dementia without behavioral disturbance: Secondary | ICD-10-CM | POA: Diagnosis not present

## 2015-10-05 DIAGNOSIS — D72829 Elevated white blood cell count, unspecified: Secondary | ICD-10-CM | POA: Diagnosis present

## 2015-10-05 DIAGNOSIS — R0902 Hypoxemia: Secondary | ICD-10-CM

## 2015-10-05 DIAGNOSIS — J449 Chronic obstructive pulmonary disease, unspecified: Secondary | ICD-10-CM | POA: Insufficient documentation

## 2015-10-05 DIAGNOSIS — I5022 Chronic systolic (congestive) heart failure: Secondary | ICD-10-CM | POA: Diagnosis present

## 2015-10-05 DIAGNOSIS — F0390 Unspecified dementia without behavioral disturbance: Secondary | ICD-10-CM | POA: Insufficient documentation

## 2015-10-05 DIAGNOSIS — R55 Syncope and collapse: Principal | ICD-10-CM | POA: Diagnosis present

## 2015-10-05 DIAGNOSIS — I130 Hypertensive heart and chronic kidney disease with heart failure and stage 1 through stage 4 chronic kidney disease, or unspecified chronic kidney disease: Secondary | ICD-10-CM | POA: Diagnosis not present

## 2015-10-05 DIAGNOSIS — F329 Major depressive disorder, single episode, unspecified: Secondary | ICD-10-CM | POA: Insufficient documentation

## 2015-10-05 DIAGNOSIS — F17210 Nicotine dependence, cigarettes, uncomplicated: Secondary | ICD-10-CM | POA: Diagnosis not present

## 2015-10-05 DIAGNOSIS — Z86711 Personal history of pulmonary embolism: Secondary | ICD-10-CM | POA: Diagnosis present

## 2015-10-05 DIAGNOSIS — F0393 Unspecified dementia, unspecified severity, with mood disturbance: Secondary | ICD-10-CM | POA: Diagnosis present

## 2015-10-05 DIAGNOSIS — D649 Anemia, unspecified: Secondary | ICD-10-CM | POA: Insufficient documentation

## 2015-10-05 DIAGNOSIS — I2782 Chronic pulmonary embolism: Secondary | ICD-10-CM

## 2015-10-05 DIAGNOSIS — Z8249 Family history of ischemic heart disease and other diseases of the circulatory system: Secondary | ICD-10-CM | POA: Diagnosis not present

## 2015-10-05 DIAGNOSIS — I110 Hypertensive heart disease with heart failure: Secondary | ICD-10-CM | POA: Diagnosis not present

## 2015-10-05 DIAGNOSIS — K7689 Other specified diseases of liver: Secondary | ICD-10-CM | POA: Insufficient documentation

## 2015-10-05 DIAGNOSIS — K8020 Calculus of gallbladder without cholecystitis without obstruction: Secondary | ICD-10-CM | POA: Insufficient documentation

## 2015-10-05 DIAGNOSIS — Z7901 Long term (current) use of anticoagulants: Secondary | ICD-10-CM | POA: Insufficient documentation

## 2015-10-05 DIAGNOSIS — R7989 Other specified abnormal findings of blood chemistry: Secondary | ICD-10-CM | POA: Diagnosis not present

## 2015-10-05 DIAGNOSIS — J189 Pneumonia, unspecified organism: Secondary | ICD-10-CM

## 2015-10-05 DIAGNOSIS — Z681 Body mass index (BMI) 19 or less, adult: Secondary | ICD-10-CM | POA: Insufficient documentation

## 2015-10-05 DIAGNOSIS — Z86718 Personal history of other venous thrombosis and embolism: Secondary | ICD-10-CM | POA: Insufficient documentation

## 2015-10-05 DIAGNOSIS — I2510 Atherosclerotic heart disease of native coronary artery without angina pectoris: Secondary | ICD-10-CM | POA: Diagnosis present

## 2015-10-05 DIAGNOSIS — E1122 Type 2 diabetes mellitus with diabetic chronic kidney disease: Secondary | ICD-10-CM | POA: Insufficient documentation

## 2015-10-05 DIAGNOSIS — Q211 Atrial septal defect: Secondary | ICD-10-CM | POA: Diagnosis not present

## 2015-10-05 DIAGNOSIS — I5042 Chronic combined systolic (congestive) and diastolic (congestive) heart failure: Secondary | ICD-10-CM | POA: Insufficient documentation

## 2015-10-05 DIAGNOSIS — R945 Abnormal results of liver function studies: Secondary | ICD-10-CM

## 2015-10-05 DIAGNOSIS — Z8673 Personal history of transient ischemic attack (TIA), and cerebral infarction without residual deficits: Secondary | ICD-10-CM

## 2015-10-05 DIAGNOSIS — Y95 Nosocomial condition: Secondary | ICD-10-CM | POA: Diagnosis not present

## 2015-10-05 DIAGNOSIS — Z7982 Long term (current) use of aspirin: Secondary | ICD-10-CM | POA: Insufficient documentation

## 2015-10-05 DIAGNOSIS — E440 Moderate protein-calorie malnutrition: Secondary | ICD-10-CM | POA: Diagnosis present

## 2015-10-05 DIAGNOSIS — J690 Pneumonitis due to inhalation of food and vomit: Secondary | ICD-10-CM | POA: Diagnosis not present

## 2015-10-05 DIAGNOSIS — I420 Dilated cardiomyopathy: Secondary | ICD-10-CM | POA: Insufficient documentation

## 2015-10-05 DIAGNOSIS — E119 Type 2 diabetes mellitus without complications: Secondary | ICD-10-CM

## 2015-10-05 LAB — CBG MONITORING, ED: GLUCOSE-CAPILLARY: 171 mg/dL — AB (ref 65–99)

## 2015-10-05 LAB — CBC AND DIFFERENTIAL
HEMATOCRIT: 26 % — AB (ref 41–53)
HEMOGLOBIN: 7.4 g/dL — AB (ref 13.5–17.5)
Platelets: 363 10*3/uL (ref 150–399)
WBC: 17 10*3/mL

## 2015-10-05 LAB — COMPREHENSIVE METABOLIC PANEL
ALBUMIN: 2.3 g/dL — AB (ref 3.5–5.0)
ALT: 263 U/L — ABNORMAL HIGH (ref 17–63)
ANION GAP: 7 (ref 5–15)
AST: 155 U/L — ABNORMAL HIGH (ref 15–41)
Alkaline Phosphatase: 170 U/L — ABNORMAL HIGH (ref 38–126)
BILIRUBIN TOTAL: 0.3 mg/dL (ref 0.3–1.2)
BUN: 17 mg/dL (ref 6–20)
CO2: 26 mmol/L (ref 22–32)
Calcium: 9 mg/dL (ref 8.9–10.3)
Chloride: 104 mmol/L (ref 101–111)
Creatinine, Ser: 1.34 mg/dL — ABNORMAL HIGH (ref 0.61–1.24)
GFR calc non Af Amer: 50 mL/min — ABNORMAL LOW (ref >60)
GFR, EST AFRICAN AMERICAN: 58 mL/min — AB (ref >60)
GLUCOSE: 141 mg/dL — AB (ref 65–99)
POTASSIUM: 5.1 mmol/L (ref 3.5–5.1)
SODIUM: 137 mmol/L (ref 135–145)
TOTAL PROTEIN: 7 g/dL (ref 6.5–8.1)

## 2015-10-05 LAB — CBC WITH DIFFERENTIAL/PLATELET
BASOS ABS: 0 10*3/uL (ref 0.0–0.1)
Basophils Relative: 0 %
EOS ABS: 0 10*3/uL (ref 0.0–0.7)
EOS PCT: 0 %
HCT: 26.3 % — ABNORMAL LOW (ref 39.0–52.0)
Hemoglobin: 7.4 g/dL — ABNORMAL LOW (ref 13.0–17.0)
LYMPHS ABS: 1.4 10*3/uL (ref 0.7–4.0)
LYMPHS PCT: 8 %
MCH: 20.4 pg — ABNORMAL LOW (ref 26.0–34.0)
MCHC: 28.1 g/dL — AB (ref 30.0–36.0)
MCV: 72.5 fL — ABNORMAL LOW (ref 78.0–100.0)
MONOS PCT: 7 %
Monocytes Absolute: 1.2 10*3/uL — ABNORMAL HIGH (ref 0.1–1.0)
NEUTROS ABS: 14.4 10*3/uL — AB (ref 1.7–7.7)
Neutrophils Relative %: 85 %
Platelets: 363 10*3/uL (ref 150–400)
RBC: 3.63 MIL/uL — ABNORMAL LOW (ref 4.22–5.81)
RDW: 20.3 % — AB (ref 11.5–15.5)
WBC: 17 10*3/uL — ABNORMAL HIGH (ref 4.0–10.5)

## 2015-10-05 LAB — BASIC METABOLIC PANEL
BUN: 17 mg/dL (ref 4–21)
Creatinine: 1.3 mg/dL (ref 0.6–1.3)
Glucose: 141 mg/dL
Potassium: 5.1 mmol/L (ref 3.4–5.3)
SODIUM: 137 mmol/L (ref 137–147)

## 2015-10-05 LAB — HEPATIC FUNCTION PANEL
Alkaline Phosphatase: 170 U/L — AB (ref 25–125)
Bilirubin, Total: 0.3 mg/dL

## 2015-10-05 LAB — I-STAT TROPONIN, ED: TROPONIN I, POC: 0.02 ng/mL (ref 0.00–0.08)

## 2015-10-05 NOTE — Assessment & Plan Note (Signed)
SNF - tx with IV broad spectrum in hospital, d/c with levaquin ; pt not requiring O2

## 2015-10-05 NOTE — Progress Notes (Signed)
MRN: [id number] Name: Grant Lara  Sex: male Age: 75 y.o. DOB: [birth year]  [employer] #:  Facility/Room:[facility]/223-A Level Of Care: SNF Provider: Inocencio Homes MD  Emergency Contacts: Extended Emergency Contact Information Primary Emergency Contact: Dempsey,[name] Address: [address]          [place], [place] [address] [name] of [place] Phone: [phone] Mobile Phone: [phone] Relation: Spouse Secondary Emergency Contact: [name],[name]          [place], [place] of [place] Phone: [phone] Relation: Daughter  Code Status: Full Code  Allergies: Review of patient's allergies indicates no known allergies.  Chief Complaint  Patient presents with  . Readmit To SNF    HPI: Patient is 75 y.o. male who was recently discharged [facility] via [facility] from [facility] presents for possible presyncopal episode vs seizure. Pt was admitted to [facility] from 6/16-21 where  he was dx with  multilobar pneumonia, possibly aspiration. Pt was tx with broad spectrum abx for 6 days with no fever or leukocytosis and BC have been negative. Pt is admitted to SNF for generalized weakness for residential care. While at SNF pt will be followed for h/o embolic stroke tx with xarelto, dementia, tx with aricept and HLD, tx with lipitor.  Past Medical History  Diagnosis Date  . Hypertension   . Dilated cardiomyopathy ([place])     2/12: EF 30-35%, trivial AI, mild RAE.  EF 2014 40 -45%  . CAD (coronary artery disease)     LHC 9/05 with Dr. Einar Gip:  dLM 20-30%, LAD 85%, oD1 20-30%.  PCI:  Taxus DES to LAD; Dx jailed and tx with POBA.  Last myoview 12/10: inf scar, no ischemia, EF 29%.  . DVT (deep venous thrombosis) ([place])   . Pulmonary embolus (HCC)     chronic coumadin  . 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 PFO (patent foramen ovale)     Not mentioned on 2014 echo.  [name] HLD (hyperlipidemia)   . Crohn's disease ([place])   . Nephrolithiasis   . Diabetes mellitus     11/05/11  "borderline; don't take medications"  . Stroke [facility]) 1993    "left arm can't hold steady; leg too"  . Arthritis     "used to have a touch in my legs"  . Memory difficulties   . Swelling of both ankles      09-22-13 occ.feet, but denies pain.  . Bradycardia 06/01/2014  . Dementia   . Hyperlipidemia   . CHF (congestive heart failure) ([place])   . Pneumonia ~ 2011    09-22-13 denies any recent SOB or breathing problems  . HCAP (healthcare-associated pneumonia) 09/29/2015  . AICD (automatic cardioverter/defibrillator) present     Past Surgical History  Procedure Laterality Date  . Colon surgery  1994; 1996    "for Crohn's disease"  . Appendectomy    . Implantable cardioverter defibrillator implant N/A 06/02/2014    Procedure: IMPLANTABLE CARDIOVERTER DEFIBRILLATOR IMPLANT;  Surgeon: [name], MD;  Location[facility] C[facility] LAB;  Service: Cardiovascular;  Laterality: N/A;      Medication List       This list is accurate as of: 10/05/15  9:06 PM.  Always use your most recent med list.               acetaminophen 500 MG tablet  Commonly known as:  TYLENOL  Take 2 tablets (1,000 mg total) by mouth 2 (two) times daily.     ALPRAZolam 0.5 MG tablet  Commonly  known as:  XANAX  Take 1 tablet (0.5 mg total) by mouth at bedtime as needed.     aspirin 81 MG tablet  Take 81 mg by mouth daily.     atorvastatin 10 MG tablet  Commonly known as:  LIPITOR  Take 1 tablet (10 mg total) by mouth daily at 6 PM.     donepezil 10 MG tablet  Commonly known as:  ARICEPT  Take 10 mg by mouth at bedtime.     feeding supplement (ENSURE ENLIVE) Liqd  Take 237 mLs by mouth 2 (two) times daily between meals.     latanoprost 0.005 % ophthalmic solution  Commonly known as:  XALATAN  Place 1 drop into both eyes at bedtime.     levalbuterol 0.63 MG/3ML nebulizer solution  Commonly known as:  XOPENEX  Take 3 mLs (0.63 mg total) by nebulization every 6 (six) hours as needed for wheezing or shortness of  breath.     levofloxacin 750 MG tablet  Commonly known as:  LEVAQUIN  Take 1 tablet (750 mg total) by mouth daily.     loratadine 10 MG tablet  Commonly known as:  CLARITIN  Take 10 mg by mouth daily as needed for allergies.     rivaroxaban 20 MG Tabs tablet  Commonly known as:  XARELTO  Take 1 tablet (20 mg total) by mouth daily with supper.     senna 8.6 MG Tabs tablet  Commonly known as:  SENOKOT  Take 1 tablet by mouth every morning.     sertraline 50 MG tablet  Commonly known as:  ZOLOFT  Take 50 mg by mouth every morning.     tamsulosin 0.4 MG Caps capsule  Commonly known as:  FLOMAX  Take 1 capsule (0.4 mg total) by mouth daily.        No orders of the defined types were placed in this encounter.    Immunization History  Administered Date(s) Administered  . Influenza Whole 02/09/2010  . PPD Test 11/05/2011  . Pneumococcal Polysaccharide-23 02/19/2010    Social History  Substance Use Topics  . Smoking status: Current Some Day Smoker -- 0.50 packs/day for 53 years    Types: Cigarettes  . Smokeless tobacco: Never Used  . Alcohol Use: No    Family history is   Family History  Problem Relation Age of Onset  . Diabetes type II Mother   . CAD Father   . Diabetes type II Brother       Review of Systems  DATA OBTAINED: from patient GENERAL:  no fevers, fatigue, appetite changes SKIN: No itching, rash or wounds EYES: No eye pain, redness, discharge EARS: No earache, tinnitus, change in hearing NOSE: No congestion, drainage or bleeding  MOUTH/THROAT: No mouth or tooth pain, No sore throat RESPIRATORY: No cough, wheezing, SOB CARDIAC: No chest pain, palpitations, lower extremity edema  GI: No abdominal pain, No N/V/D or constipation, No heartburn or reflux  GU: No dysuria, frequency or urgency, or incontinence  MUSCULOSKELETAL: No unrelieved bone/joint pain NEUROLOGIC: No headache, dizziness or focal weakness PSYCHIATRIC: No c/o anxiety or sadness    Filed Vitals:   10/05/15 1213  BP: 130/68  Pulse: 79  Temp: 97.6 F (36.4 C)  Resp: 20    SpO2 Readings from Last 1 Encounters:  10/05/15 100%        Physical Exam  GENERAL APPEARANCE: Alert, mod conversant,  No acute distress.  SKIN: No diaphoresis rash HEAD: Normocephalic, atraumatic  EYES: Conjunctiva/lids  clear. Pupils round, reactive. EOMs intact.  EARS: External exam WNL, canals clear. Hearing grossly normal.  NOSE: No deformity or discharge.  MOUTH/THROAT: Lips w/o lesions  RESPIRATORY: Breathing is even, unlabored. Lung sounds are clear   CARDIOVASCULAR: Heart RRR no murmurs, rubs or gallops. No peripheral edema.   GASTROINTESTINAL: Abdomen is soft, non-tender, not distended w/ normal bowel sounds. GENITOURINARY: Bladder non tender, not distended  MUSCULOSKELETAL: No abnormal joints or musculature NEUROLOGIC:  Cranial nerves 2-12 grossly intact.L side weakness PSYCHIATRIC: Mood and affect appropriate to situation with dementia, no behavioral issues  Patient Active Problem List   Diagnosis Date Noted  . Lactic acidosis 09/30/2015  . Protein-calorie malnutrition, moderate ([place]) 09/30/2015  . 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 ICD (implantable cardioverter-defibrillator) in place 09/29/2015  . HCAP (healthcare-associated pneumonia) 09/29/2015  . Hypoxia 09/29/2015  . Vascular dementia without behavioral disturbance 09/25/2015  . Anemia 09/08/2015  . Fall 09/08/2015  . Depression 07/15/2015  . OA (osteoarthritis) 06/06/2015  . Bilateral lower extremity pain 05/14/2015  . BPH (benign prostatic hyperplasia) 04/10/2015  . Anxiety 04/10/2015  . FTT (failure to thrive) in adult 11/05/2014  . Mental status change 10/30/2014  . Protein-calorie malnutrition, severe ([place]) 10/30/2014  . Increased urinary frequency 10/30/2014  . Faintness   . Generalized weakness 09/01/2014  . 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 Cardiac syncope   . Other  specified hypotension   . Chronic systolic CHF (congestive heart failure) ([place])   . Late effects of CVA (cerebrovascular accident) 07/16/2014  . Bradycardia 06/01/2014  . Syncope 05/30/2014  . Stroke ([place])   . CVA (cerebral infarction)   . Hyperlipidemia   . Coronary artery disease involving native coronary artery of native heart without angina pectoris   . LVH (left ventricular hypertrophy)   . NSVT (nonsustained ventricular tachycardia) ([place]) 04/16/2014  . CKD (chronic kidney disease) stage 3, GFR 30-59 ml/min 04/16/2014  . Aphasia 04/15/2014  . Abnormal EKG   . Anemia, chronic disease 11/21/2013  . DM2 (diabetes mellitus, type 2) ([place]) 11/20/2013  . Hx pulmonary embolism 11/20/2013  . Leukocytosis 11/20/2013  . Hyperglycemia 08/22/2013  . Dehydration 08/21/2013  . ARF (acute renal failure) ([place]) 08/21/2013  . Abnormal ECG 08/21/2013  . Delirium 07/18/2013  . Small bowel obstruction ([place]) 07/13/2013  . Encounter for therapeutic drug monitoring 05/11/2013  . Crohn's disease ([place]) 02/16/2012  . Abnormal CT scan of lung 11/08/2011  . Bilateral leg and foot pain 11/06/2011  . HLD (hyperlipidemia) 03/28/2011  . Pulmonary embolism ([place]) 06/05/2010  . History of embolic stroke 42/70/6237  . DVT (deep venous thrombosis) ([place]) 06/05/2010  . EMPHYSEMATOUS BLEB 04/06/2010  . COPD (chronic obstructive pulmonary disease) ([place]) 04/05/2010  . Chronic systolic heart failure ([place]) 01/10/2010  . Memory loss 04/03/2009  . CHEST PAIN 03/08/2009  . Hypertensive heart disease with CHF (congestive heart failure) ([place]) 02/10/2009  . TOBACCO ABUSE 12/12/2008  . Dilated cardiomyopathy ([place]) 12/12/2008  . ABNORMAL ELECTROCARDIOGRAM 12/12/2008       Component Value Date/Time   WBC 16.1* 10/02/2015 0634   RBC 4.02* 10/02/2015 0634   RBC 4.34 09/09/2015 1036   HGB 8.2* 10/02/2015 0634   HCT 29.2* 10/02/2015 0634   PLT 371 10/02/2015 0634   MCV 72.6* 10/02/2015 0634   LYMPHSABS 1.5 09/29/2015  1210   MONOABS 2.1* 09/29/2015 1210   EOSABS 0.0 09/29/2015 1210   BASOSABS 0.0 09/29/2015 1210        Component Value Date/Time  NA 135 10/03/2015 1538   K 4.0 10/03/2015 1538   CL 103 10/03/2015 1538   CO2 25 10/03/2015 1538   GLUCOSE 207* 10/03/2015 1538   BUN 21* 10/03/2015 1538   CREATININE 1.21 10/03/2015 1538   CREATININE 1.34 12/13/2011 1722   CALCIUM 8.7* 10/03/2015 1538   PROT 6.3* 09/30/2015 0512   ALBUMIN 2.5* 09/30/2015 0512   AST 19 09/30/2015 0512   ALT 37 09/30/2015 0512   ALKPHOS 109 09/30/2015 0512   BILITOT 1.0 09/30/2015 0512   GFRNONAA 56* 10/03/2015 1538   GFRAA >60 10/03/2015 1538    Lab Results  Component Value Date   HGBA1C 6.8* 09/08/2015    Lab Results  Component Value Date   CHOL 149 04/16/2014   HDL 48 04/16/2014   LDLCALC 80 04/16/2014   TRIG 104 04/16/2014   CHOLHDL 3.1 04/16/2014     Dg Chest 2 View  09/29/2015  CLINICAL DATA:  Hypoxia EXAM: CHEST  2 VIEW COMPARISON:  09/08/2015 FINDINGS: Cardiac shadow is stable. Defibrillator is again noted. Lungs are well aerated bilaterally. No focal infiltrate or effusion is noted. No bony abnormality is seen. IMPRESSION: No active cardiopulmonary disease. Electronically Signed   By: Inez Catalina M.D.   On: 09/29/2015 13:49   Ct Head Wo Contrast  09/29/2015  CLINICAL DATA:  Possible seizure today. The patient was unresponsive for 30 seconds. Initial encounter. EXAM: CT HEAD WITHOUT CONTRAST TECHNIQUE: Contiguous axial images were obtained from the base of the skull through the vertex without intravenous contrast. COMPARISON:  Head CT scan 09/08/2015 and 12/21/2014. FINDINGS: Atrophy and extensive chronic microvascular ischemic change are again seen. Bilateral cerebellar infarcts, most conspicuous in the left cerebellar hemisphere and bilateral subcortical infarcts best seen in the right caudate and thalamus are again identified. Anterior and posterior watershed territory infarcts bilaterally are also  again identified. No evidence of acute abnormality including hemorrhage, infarct, mass lesion, mass effect, midline shift or abnormal extra-axial fluid collection. No hydrocephalus or pneumocephalus. The calvarium is intact. Imaged paranasal sinuses mastoid air cells are clear. IMPRESSION: No acute abnormality. Extensive atrophy and multiple remote infarctions as seen on prior exams. Electronically Signed   By: Inge Rise M.D.   On: 09/29/2015 13:14   Ct Chest Wo Contrast  09/29/2015  CLINICAL DATA:  75 year old male with a cough for the past 2 days. History of CHF. EXAM: CT CHEST WITHOUT CONTRAST TECHNIQUE: Multidetector CT imaging of the chest was performed following the standard protocol without IV contrast. COMPARISON:  Chest x-ray obtained earlier today ; prior chest CT 04/18/2014 FINDINGS: Mediastinum: Unremarkable CT appearance of the thyroid gland. No suspicious mediastinal or hilar adenopathy. No soft tissue mediastinal mass. The thoracic esophagus is unremarkable. Heart/Vascular: Limited evaluation in the absence of intravenous contrast. Left subclavian approach cardiac rhythm maintenance device with leads terminating in the right atrium and right ventricular apex. The heart is normal in size. No pericardial effusion. Atherosclerotic calcifications present throughout the coronary arteries. No pericardial effusion. Lungs/Pleura: Stable 2 cm thin walled cystic structure with adjacent pleural parenchymal scarring in the right upper lobe. Mild background centrilobular emphysema. Interval development of patchy ground-glass attenuation airspace opacities in a peribronchovascular distribution in the right greater than left lower lobes. Findings superimposed on a background of chronic scarring. Bones/Soft Tissues: No acute fracture or aggressive appearing lytic or blastic osseous lesion. Upper Abdomen: Multiple hypoechoic lesions throughout both kidneys which are incompletely characterized in the absence  of intravenous contrast material. However, prior imaging demonstrated multiple  simple cysts. No nephrolithiasis visualized. Upper abdominal organs are unremarkable. IMPRESSION: 1. Findings most consistent with an infectious/ inflammatory process involving the bilateral lower lobes. Given the distribution, aspiration is a consideration as is multilobar pneumonia. 2. Coronary artery calcifications. 3. Bilateral renal cysts. Electronically Signed   By: Jacqulynn Cadet M.D.   On: 09/29/2015 16:00   Portable Chest 1 View  09/30/2015  CLINICAL DATA:  Health care associated pneumonia. EXAM: PORTABLE CHEST 1 VIEW COMPARISON:  Radiograph of September 29, 2015. FINDINGS: The heart size and mediastinal contours are within normal limits. Both lungs are clear. No pneumothorax or pleural effusion is noted. Left-sided pacemaker is unchanged in position. The visualized skeletal structures are unremarkable. IMPRESSION: No acute cardiopulmonary abnormality seen. Electronically Signed   By: Marijo Conception, M.D.   On: 09/30/2015 10:08   Dg Swallowing Func-speech Pathology  09/30/2015  Objective Swallowing Evaluation: Type of Study: MBS-Modified Barium Swallow Study Patient Details Name: Grant Lara MRN: [id number] Date of Birth: 1941-03-18 Today's Date: 09/30/2015 Time: SLP Start Time (ACUTE ONLY): 1100-SLP Stop Time (ACUTE ONLY): 1120 SLP Time Calculation (min) (ACUTE ONLY): 20 min Past Medical History: Past Medical History Diagnosis Date . Hypertension  . Dilated cardiomyopathy ([place])    2/12: EF 30-35%, trivial AI, mild RAE.  EF 2014 40 -45% . CAD (coronary artery disease)    LHC 9/05 with Dr. Einar Gip:  dLM 20-30%, LAD 85%, oD1 20-30%.  PCI:  Taxus DES to LAD; Dx jailed and tx with POBA.  Last myoview 12/10: inf scar, no ischemia, EF 29%. . DVT (deep venous thrombosis) ([name])  . Pulmonary embolus (HCC)    chronic coumadin . 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 PFO (patent foramen ovale)    Not mentioned on 2014 echo. [name] HLD (hyperlipidemia)  .  Crohn's disease ([place])  . Nephrolithiasis  . Diabetes mellitus    11/05/11 "borderline; don't take medications" . Stroke [facility]) 1993   "left arm can't hold steady; leg too" . Arthritis    "used to have a touch in my legs" . Memory difficulties  . Swelling of both ankles     09-22-13 occ.feet, but denies pain. . Bradycardia 06/01/2014 . Dementia  . Hyperlipidemia  . CHF (congestive heart failure) ([place])  . Pneumonia ~ 2011   09-22-13 denies any recent SOB or breathing problems . HCAP (healthcare-associated pneumonia) 09/29/2015 . AICD (automatic cardioverter/defibrillator) present  Past Surgical History: Past Surgical History Procedure Laterality Date . Colon surgery  1994; 1996   "for Crohn's disease" . Appendectomy   . Implantable cardioverter defibrillator implant N/A 06/02/2014   Procedure: IMPLANTABLE CARDIOVERTER DEFIBRILLATOR IMPLANT;  Surgeon: [name], MD;  Location: [facility] CATH LAB;  Service: Cardiovascular;  Laterality: N/A; HPI: Grant Lara is a 75 y.o. male who was recently discharged [facility] for possible syncopal episode vs seizure. Pt was at PT when he had a 30 second episode of "unresponsiveness" with a right sided gaze. Pt was sitting, no fall or head injury. After episode pt had generalized bilateral weakness. No history of seizure known. Pt responsive to verbal stimuli. GCS reported 13. Follows commands. EMS reported rhonchi in lower bases with nonproductive cough. Oxygen sat in 80s RA on arrival. He has a PMH of dementia, hypertension, hyperlipidemia, diabetes mellitus, depression, CAD, DVT, PE on Xarelto, PFO, chronic disease, stroke, BPH, systolic congestive heart failure (EF 20-25%), who presents with generalized weakness and increased cough, chest congestion and shortness of breath.The patient was evaluated in  the ED and found to have an acute leukocytosis, hypoxia and abnormal lung sounds. A CT of the chest was obtained that was significant for  multifocal bilateral pneumonia. No Data Recorded Assessment / Plan / Recommendation CHL IP CLINICAL IMPRESSIONS 09/30/2015 Therapy Diagnosis Mild pharyngeal phase dysphagia Clinical Impression Pt demonstrates mild oropharyngeal dysphagia characterized by delayed swallow initiation with consistent flash penetration of liquids. Laryngeal closure is adequate and airway remained protected throughout assessment despite large consecutive swallows. Recommend pt consume a regular diet and thin liquids. No SLP f/u needed, will sign off.  Impact on safety and function Mild aspiration risk   CHL IP TREATMENT RECOMMENDATION 09/30/2015 Treatment Recommendations No treatment recommended at this time   No flowsheet data found. CHL IP DIET RECOMMENDATION 09/30/2015 SLP Diet Recommendations Regular solids;Thin liquid Liquid Administration via Cup;Straw Medication Administration Whole meds with liquid Compensations Slow rate;Small sips/bites Postural Changes Seated upright at 90 degrees   CHL IP OTHER RECOMMENDATIONS 09/30/2015 Recommended Consults -- Oral Care Recommendations Oral care BID Other Recommendations --   CHL IP FOLLOW UP RECOMMENDATIONS 09/30/2015 Follow up Recommendations None   CHL IP FREQUENCY AND DURATION 04/18/2014 Speech Therapy Frequency (ACUTE ONLY) min 2x/week Treatment Duration --      CHL IP ORAL PHASE 09/30/2015 Oral Phase WFL Oral - Pudding Teaspoon -- Oral - Pudding Cup -- Oral - Honey Teaspoon -- Oral - Honey Cup -- Oral - Nectar Teaspoon -- Oral - Nectar Cup -- Oral - Nectar Straw -- Oral - Thin Teaspoon -- Oral - Thin Cup -- Oral - Thin Straw -- Oral - Puree -- Oral - Mech Soft -- Oral - Regular -- Oral - Multi-Consistency -- Oral - Pill -- Oral Phase - Comment --  CHL IP PHARYNGEAL PHASE 09/30/2015 Pharyngeal Phase Impaired Pharyngeal- Pudding Teaspoon -- Pharyngeal -- Pharyngeal- Pudding Cup -- Pharyngeal -- Pharyngeal- Honey Teaspoon -- Pharyngeal -- Pharyngeal- Honey Cup -- Pharyngeal -- Pharyngeal- Nectar  Teaspoon -- Pharyngeal -- Pharyngeal- Nectar Cup -- Pharyngeal -- Pharyngeal- Nectar Straw -- Pharyngeal -- Pharyngeal- Thin Teaspoon -- Pharyngeal -- Pharyngeal- Thin Cup Delayed swallow initiation-pyriform sinuses;Penetration/Aspiration before swallow;Penetration/Aspiration during swallow Pharyngeal Material enters airway, remains ABOVE vocal cords then ejected out Pharyngeal- Thin Straw Delayed swallow initiation-pyriform sinuses;Penetration/Aspiration before swallow;Penetration/Aspiration during swallow Pharyngeal Material enters airway, remains ABOVE vocal cords then ejected out Pharyngeal- Puree Delayed swallow initiation-pyriform sinuses Pharyngeal Material does not enter airway Pharyngeal- Mechanical Soft -- Pharyngeal -- Pharyngeal- Regular Delayed swallow initiation-pyriform sinuses Pharyngeal -- Pharyngeal- Multi-consistency -- Pharyngeal -- Pharyngeal- Pill Delayed swallow initiation-pyriform sinuses;Penetration/Aspiration before swallow;Penetration/Aspiration during swallow Pharyngeal Material enters airway, remains ABOVE vocal cords then ejected out Pharyngeal Comment --  No flowsheet data found. No flowsheet data found. Herbie [place], [place] CCC-SLP [phone] [place], [name] 09/30/2015, 12:49 PM               Not all labs, radiology exams or other studies done during hospitalization come through on my EPIC note; however they are reviewed by me.    Assessment and Plan  HCAP (healthcare-associated pneumonia) SNF - tx with IV broad spectrum in hospital, d/c with levaquin ; pt not requiring O2  COPD (chronic obstructive pulmonary disease) ([place]) SNF - stable, no exacerbation;cont xopenex prn  Hypoxia SNF - resolved  Chronic systolic heart failure ([place]) SNF - controlled on no diuretic or bblocker  Hypertensive heart disease with CHF (congestive heart failure) ([place]) SNF - controlled on no meds,will cont to  monitor  Pulmonary embolism SNF - chronic and stable and o  xarelto 20 mg  daily  Vascular dementia without behavioral disturbance SNF - chronic and stable;cont aricpet  CKD (chronic kidney disease) stage 3, GFR 30-59 ml/min SNF - not reported as uncontrolled;will f/u BMP  HLD (hyperlipidemia) SNF - chronic ans stable;cont lipitor 10 mg daily    Time spent > 45 min;> 50% of time with patient was spent reviewing records, labs, tests and studies, counseling and developing plan of care  Alexander,Anne  MD

## 2015-10-05 NOTE — Assessment & Plan Note (Signed)
SNF - controlled on no meds,will cont to  monitor

## 2015-10-05 NOTE — Assessment & Plan Note (Signed)
SNF - not reported as uncontrolled;will f/u BMP

## 2015-10-05 NOTE — ED Notes (Signed)
EMS reports patient had near syncope episode at assisted living, assisted to wheelchair. Staff at SNF states he did not fall, did not lose conciousness. Patient arrives to ED alert to self and place. Hx dementia. Patient has a pacemaker. Patient pleasant. No evidence of injury. Patient c/o right arm pain that has been going on all day. 99.4 F temp, HR, 70, BP 125/75, resp 20.

## 2015-10-05 NOTE — Assessment & Plan Note (Signed)
SNF - chronic and stable;cont aricpet

## 2015-10-05 NOTE — Assessment & Plan Note (Signed)
SNF - resolved

## 2015-10-05 NOTE — ED Notes (Signed)
Patient transported to X-ray 

## 2015-10-05 NOTE — Assessment & Plan Note (Signed)
SNF -chronic and stable; cont ASA and xarelto

## 2015-10-05 NOTE — Assessment & Plan Note (Signed)
SNF - stable, no exacerbation;cont xopenex prn

## 2015-10-05 NOTE — Assessment & Plan Note (Signed)
SNF - chronic ans stable;cont lipitor 10 mg daily

## 2015-10-05 NOTE — Assessment & Plan Note (Signed)
SNF - controlled on no diuretic or bblocker

## 2015-10-05 NOTE — ED Notes (Signed)
Pt given ginger ale at RN's request.

## 2015-10-05 NOTE — Assessment & Plan Note (Signed)
SNF - chronic and stable and o xarelto 20 mg daily

## 2015-10-05 NOTE — ED Provider Notes (Signed)
CSN: [id number]     Arrival date & time 10/05/15  2106 History   First MD Initiated Contact with Patient 10/05/15 2108     Chief Complaint  Patient presents with  . Near Syncope   HPI Comments: 75 y.o. male with a recent diagnosis of pneumonia, hypertension, CAD, PE on Xarelto presents to the ED after a near syncopal episode at his facility. He was reportedly lowered into a wheelchair. He is unable to provide any details of the history. I called his facility, and there was no answer or answering service. History is limited.  Patient is a 75 y.o. male presenting with near-syncope. The history is provided by the patient. The history is limited by the condition of the patient and the absence of a caregiver (Dementia; SNF did not answer phone).  Near Syncope This is a new problem. The current episode started today. Episode frequency: Unknown. The problem has been resolved. Nothing aggravates the symptoms. He has tried nothing for the symptoms. Improvement on treatment: n/a.    Past Medical History  Diagnosis Date  . Hypertension   . Dilated cardiomyopathy ([place])     2/12: EF 30-35%, trivial AI, mild RAE.  EF 2014 40 -45%  . CAD (coronary artery disease)     LHC 9/05 with Dr. Einar Gip:  dLM 20-30%, LAD 85%, oD1 20-30%.  PCI:  Taxus DES to LAD; Dx jailed and tx with POBA.  Last myoview 12/10: inf scar, no ischemia, EF 29%.  . DVT (deep venous thrombosis) ([place])   . Pulmonary embolus ([facility])     chronic coumadin  . 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 PFO (patent foramen ovale)     Not mentioned on 2014 echo.  [name] HLD (hyperlipidemia)   . Crohn's disease ([place])   . Nephrolithiasis   . Diabetes mellitus     11/05/11 "borderline; don't take medications"  . Stroke [facility]) 1993    "left arm can't hold steady; leg too"  . Arthritis     "used to have a touch in my legs"  . Memory difficulties   . Swelling of both ankles      09-22-13 occ.feet, but denies pain.  . Bradycardia 06/01/2014  . Dementia   .  Hyperlipidemia   . CHF (congestive heart failure) ([place])   . Pneumonia ~ 2011    09-22-13 denies any recent SOB or breathing problems  . HCAP (healthcare-associated pneumonia) 09/29/2015  . AICD (automatic cardioverter/defibrillator) present    Past Surgical History  Procedure Laterality Date  . Colon surgery  1994; 1996    "for Crohn's disease"  . Appendectomy    . Implantable cardioverter defibrillator implant N/A 06/02/2014    Procedure: IMPLANTABLE CARDIOVERTER DEFIBRILLATOR IMPLANT;  Surgeon: [name], MD;  Location: [facility] CATH LAB;  Service: Cardiovascular;  Laterality: N/A;   Family History  Problem Relation Age of Onset  . Diabetes type II Mother   . CAD Father   . Diabetes type II Brother    Social History  Substance Use Topics  . Smoking status: Current Some Day Smoker -- 0.50 packs/day for 53 years    Types: Cigarettes  . Smokeless tobacco: Never Used  . Alcohol Use: No    Review of Systems  Unable to perform ROS: Dementia  Cardiovascular: Positive for near-syncope.   Patient is unsure of what happened and why he is in the ED. States he feels fine now.    Allergies  Review of patient's allergies indicates no known allergies.  Home Medications   Prior to Admission medications   Medication Sig Start Date End Date Taking? Authorizing Provider  acetaminophen (TYLENOL) 500 MG tablet Take 2 tablets (1,000 mg total) by mouth 2 (two) times daily. 05/14/15   Gerlene Fee, NP  ALPRAZolam Duanne Moron) 0.5 MG tablet Take 1 tablet (0.5 mg total) by mouth at bedtime as needed. 10/04/15   Hosie Poisson, MD  aspirin 81 MG tablet Take 81 mg by mouth daily.    Historical Provider, MD  atorvastatin (LIPITOR) 10 MG tablet Take 1 tablet (10 mg total) by mouth daily at 6 PM. 04/18/14   Maryann Mikhail, DO  donepezil (ARICEPT) 10 MG tablet Take 10 mg by mouth at bedtime.  02/23/15   Historical Provider, MD  feeding supplement, ENSURE ENLIVE, (ENSURE ENLIVE) LIQD Take 237 mLs by mouth 2  (two) times daily between meals. 10/04/15   Hosie Poisson, MD  latanoprost (XALATAN) 0.005 % ophthalmic solution Place 1 drop into both eyes at bedtime. 06/29/13   Historical Provider, MD  levalbuterol Penne Lash) 0.63 MG/3ML nebulizer solution Take 3 mLs (0.63 mg total) by nebulization every 6 (six) hours as needed for wheezing or shortness of breath. 09/11/15   Ripudeep Krystal Eaton, MD  levofloxacin (LEVAQUIN) 750 MG tablet Take 1 tablet (750 mg total) by mouth daily. 10/04/15   Hosie Poisson, MD  loratadine (CLARITIN) 10 MG tablet Take 10 mg by mouth daily as needed for allergies.    Historical Provider, MD  rivaroxaban (XARELTO) 20 MG TABS tablet Take 1 tablet (20 mg total) by mouth daily with supper. 06/23/14   Hosie Poisson, MD  senna (SENOKOT) 8.6 MG TABS tablet Take 1 tablet by mouth every morning.    Historical Provider, MD  sertraline (ZOLOFT) 50 MG tablet Take 50 mg by mouth every morning.  05/18/14   Historical Provider, MD  tamsulosin (FLOMAX) 0.4 MG CAPS capsule Take 1 capsule (0.4 mg total) by mouth daily. 04/18/14   Maryann Mikhail, DO   BP 125/75 mmHg  Pulse 70  Temp(Src) 99.4 F (37.4 C) (Oral)  Ht 5' 9"  (1.753 m)  Wt 54.432 kg  BMI 17.71 kg/m2  SpO2 100% Physical Exam  Constitutional: He appears well-developed and well-nourished. No distress.  HENT:  Head: Normocephalic and atraumatic.  Right Ear: External ear normal.  Left Ear: External ear normal.  Neck: Normal range of motion.  Cardiovascular: Normal rate and regular rhythm.   Pulses:      Radial pulses are 2+ on the right side, and 2+ on the left side.  Pulmonary/Chest: Effort normal and breath sounds normal.  Abdominal: Soft. Bowel sounds are normal.  Neurological: He is alert.  Face symmetric, speech clear, moves all extremities spontaneously  Skin: Skin is warm and dry. He is not diaphoretic.  Psychiatric: He has a normal mood and affect.  Vitals reviewed.   ED Course  Procedures (including critical care time) Labs  Review Labs Reviewed  CBC WITH DIFFERENTIAL/PLATELET - Abnormal; Notable for the following:    WBC 17.0 (*)    RBC 3.63 (*)    Hemoglobin 7.4 (*)    HCT 26.3 (*)    MCV 72.5 (*)    MCH 20.4 (*)    MCHC 28.1 (*)    RDW 20.3 (*)    Neutro Abs 14.4 (*)    Monocytes Absolute 1.2 (*)    All other components within normal limits  COMPREHENSIVE METABOLIC PANEL - Abnormal; Notable for the following:    Glucose, Bld 141 (*)  Creatinine, Ser 1.34 (*)    Albumin 2.3 (*)    AST 155 (*)    ALT 263 (*)    Alkaline Phosphatase 170 (*)    GFR calc non Af Amer 50 (*)    GFR calc Af Amer 58 (*)    All other components within normal limits  CBG MONITORING, ED - Abnormal; Notable for the following:    Glucose-Capillary 171 (*)    All other components within normal limits  BRAIN NATRIURETIC PEPTIDE  I-STAT TROPOININ, ED    Imaging Review Dg Chest 2 View  10/05/2015  CLINICAL DATA:  75 year old male with near syncope EXAM: CHEST  2 VIEW COMPARISON:  Chest radiograph dated 09/30/2015 and chest CT dated 09/29/2015 FINDINGS: Two views of the chest demonstrate an area of increased density in the retrocardiac region, likely atelectasis/ scarring versus infiltrate. There is mild blunting of the left costophrenic angle which may represent trace pleural effusion. No pneumothorax. The cardiac silhouette is within normal limits. Left pectoral AICD device. There is degenerative changes of the spine. No acute osseous pathology. IMPRESSION: Retrocardiac density may represent atelectasis versus infiltrate. Electronically Signed   By: Anner Crete M.D.   On: 10/05/2015 22:58   I have personally reviewed and evaluated these images and lab results as part of my medical decision-making.   EKG Interpretation None       EKG Rate 74, NSR PR 167, QTc 451 No acute ischemic changes  MDM   Final diagnoses:  Near syncope  Anemia, unspecified anemia type    75 y.o. male with past and should dementia, CAD,  PE/DVT on Xarelto presents to the ED after a near syncopal episode per report from EMS. Level V ER due to dementia. The patient is unable to provide any details of what happened today or why he is here. He states he is feeling otherwise okay right now.  His ICD was interrogated and showed no arrhythmias. CBC notable for a leukocytosis of 17, hemoglobin of 7.4 which is down about 1 point since a few days ago. CMP notable for elevated liver function tests. Troponin within normal limits.  Given his near syncope with decreasing hemoglobin, I will discuss his case with the hospitalist for readmission. He was discharged yesterday from the hospital after being admitted for pneumonia. His chest x-ray did show a retrocardiac opacity concerning for pneumonia. He is breathing well on room air and has no respiratory complaints. He is currently still on Levaquin for pneumonia.  Discussed with Dr. Myna Hidalgo, hospitalist, who will admit the patient.   Case managed in conjunction with my attending, Dr. Vanita Panda.    Berenice Primas, MD 10/06/15 5701  Carmin Muskrat, MD 10/07/15 Laureen Abrahams

## 2015-10-06 ENCOUNTER — Other Ambulatory Visit (HOSPITAL_COMMUNITY): Payer: Medicare Other

## 2015-10-06 ENCOUNTER — Observation Stay (HOSPITAL_COMMUNITY): Payer: Medicare Other

## 2015-10-06 ENCOUNTER — Encounter (HOSPITAL_COMMUNITY): Payer: Self-pay | Admitting: Family Medicine

## 2015-10-06 ENCOUNTER — Observation Stay (HOSPITAL_BASED_OUTPATIENT_CLINIC_OR_DEPARTMENT_OTHER): Payer: Medicare Other

## 2015-10-06 DIAGNOSIS — R55 Syncope and collapse: Secondary | ICD-10-CM | POA: Diagnosis present

## 2015-10-06 DIAGNOSIS — D649 Anemia, unspecified: Secondary | ICD-10-CM | POA: Diagnosis not present

## 2015-10-06 DIAGNOSIS — I2510 Atherosclerotic heart disease of native coronary artery without angina pectoris: Secondary | ICD-10-CM

## 2015-10-06 DIAGNOSIS — N183 Chronic kidney disease, stage 3 (moderate): Secondary | ICD-10-CM

## 2015-10-06 DIAGNOSIS — I5022 Chronic systolic (congestive) heart failure: Secondary | ICD-10-CM | POA: Diagnosis not present

## 2015-10-06 DIAGNOSIS — F0280 Dementia in other diseases classified elsewhere without behavioral disturbance: Secondary | ICD-10-CM

## 2015-10-06 DIAGNOSIS — F329 Major depressive disorder, single episode, unspecified: Secondary | ICD-10-CM

## 2015-10-06 DIAGNOSIS — D509 Iron deficiency anemia, unspecified: Secondary | ICD-10-CM | POA: Diagnosis present

## 2015-10-06 LAB — COMPREHENSIVE METABOLIC PANEL
ALK PHOS: 152 U/L — AB (ref 38–126)
ALT: 221 U/L — AB (ref 17–63)
AST: 122 U/L — ABNORMAL HIGH (ref 15–41)
Albumin: 2.1 g/dL — ABNORMAL LOW (ref 3.5–5.0)
Anion gap: 6 (ref 5–15)
BUN: 17 mg/dL (ref 6–20)
CALCIUM: 8.5 mg/dL — AB (ref 8.9–10.3)
CO2: 25 mmol/L (ref 22–32)
CREATININE: 1.33 mg/dL — AB (ref 0.61–1.24)
Chloride: 102 mmol/L (ref 101–111)
GFR calc non Af Amer: 50 mL/min — ABNORMAL LOW (ref >60)
GFR, EST AFRICAN AMERICAN: 58 mL/min — AB (ref >60)
GLUCOSE: 234 mg/dL — AB (ref 65–99)
Potassium: 4.3 mmol/L (ref 3.5–5.1)
SODIUM: 133 mmol/L — AB (ref 135–145)
Total Bilirubin: 0.2 mg/dL — ABNORMAL LOW (ref 0.3–1.2)
Total Protein: 6.1 g/dL — ABNORMAL LOW (ref 6.5–8.1)

## 2015-10-06 LAB — CULTURE, BLOOD (ROUTINE X 2)
CULTURE: NO GROWTH
CULTURE: NO GROWTH

## 2015-10-06 LAB — ECHOCARDIOGRAM COMPLETE
CHL CUP MV DEC (S): 303
CHL CUP TV REG PEAK VELOCITY: 280 cm/s
E/e' ratio: 4.34
EWDT: 303 ms
FS: 16 % — AB (ref 28–44)
Height: 69 in
IV/PV OW: 0.63
LA vol index: 35.2 mL/m2
LA vol: 58 mL
LADIAMINDEX: 1.94 cm/m2
LASIZE: 32 mm
LAVOLA4C: 37.6 mL
LEFT ATRIUM END SYS DIAM: 32 mm
LV E/e'average: 4.34
LV PW d: 13.4 mm — AB (ref 0.6–1.1)
LVEEMED: 4.34
LVELAT: 12.3 cm/s
LVOT area: 4.52 cm2
LVOT diameter: 24 mm
MV pk E vel: 53.4 m/s
MVPKAVEL: 61.6 m/s
TDI e' lateral: 12.3
TDI e' medial: 7.83
TRMAXVEL: 280 cm/s
Weight: 1894.19 oz

## 2015-10-06 LAB — URINALYSIS, ROUTINE W REFLEX MICROSCOPIC
Bilirubin Urine: NEGATIVE
GLUCOSE, UA: NEGATIVE mg/dL
HGB URINE DIPSTICK: NEGATIVE
Ketones, ur: NEGATIVE mg/dL
Leukocytes, UA: NEGATIVE
Nitrite: NEGATIVE
Protein, ur: NEGATIVE mg/dL
SPECIFIC GRAVITY, URINE: 1.013 (ref 1.005–1.030)
pH: 6.5 (ref 5.0–8.0)

## 2015-10-06 LAB — BASIC METABOLIC PANEL
BUN: 17 mg/dL (ref 4–21)
Creatinine: 1.3 mg/dL (ref 0.6–1.3)
GLUCOSE: 234 mg/dL
POTASSIUM: 4.3 mmol/L (ref 3.4–5.3)
Sodium: 133 mmol/L — AB (ref 137–147)

## 2015-10-06 LAB — PREPARE RBC (CROSSMATCH)

## 2015-10-06 LAB — GLUCOSE, CAPILLARY: Glucose-Capillary: 246 mg/dL — ABNORMAL HIGH (ref 65–99)

## 2015-10-06 LAB — BRAIN NATRIURETIC PEPTIDE: B NATRIURETIC PEPTIDE 5: 77.1 pg/mL (ref 0.0–100.0)

## 2015-10-06 LAB — HEPATIC FUNCTION PANEL: Bilirubin, Total: 0.2 mg/dL

## 2015-10-06 LAB — PROCALCITONIN: PROCALCITONIN: 0.19 ng/mL

## 2015-10-06 MED ORDER — RIVAROXABAN 20 MG PO TABS
20.0000 mg | ORAL_TABLET | Freq: Every day | ORAL | Status: DC
Start: 2015-10-06 — End: 2015-10-09
  Administered 2015-10-06 – 2015-10-08 (×3): 20 mg via ORAL
  Filled 2015-10-06 (×3): qty 1

## 2015-10-06 MED ORDER — LEVOFLOXACIN IN D5W 750 MG/150ML IV SOLN
750.0000 mg | INTRAVENOUS | Status: DC
  Administered 2015-10-06: 750 mg via INTRAVENOUS
  Filled 2015-10-06: qty 150

## 2015-10-06 MED ORDER — AMOXICILLIN-POT CLAVULANATE 250-125 MG PO TABS
1.0000 | ORAL_TABLET | Freq: Three times a day (TID) | ORAL | Status: DC
  Administered 2015-10-06 – 2015-10-09 (×11): 1 via ORAL
  Filled 2015-10-06 (×11): qty 1

## 2015-10-06 MED ORDER — LATANOPROST 0.005 % OP SOLN
1.0000 [drp] | Freq: Every day | OPHTHALMIC | Status: DC
  Administered 2015-10-06 – 2015-10-08 (×4): 1 [drp] via OPHTHALMIC
  Filled 2015-10-06: qty 2.5

## 2015-10-06 MED ORDER — ACETAMINOPHEN 325 MG PO TABS
650.0000 mg | ORAL_TABLET | Freq: Four times a day (QID) | ORAL | Status: DC | PRN
  Administered 2015-10-08: 650 mg via ORAL
  Filled 2015-10-06: qty 2

## 2015-10-06 MED ORDER — FUROSEMIDE 10 MG/ML IJ SOLN
40.0000 mg | Freq: Once | INTRAMUSCULAR | Status: AC
  Administered 2015-10-06: 40 mg via INTRAVENOUS
  Filled 2015-10-06: qty 4

## 2015-10-06 MED ORDER — POLYETHYLENE GLYCOL 3350 17 G PO PACK: 17.0000 g | PACK | Freq: Every day | ORAL | Status: DC | PRN

## 2015-10-06 MED ORDER — ONDANSETRON HCL 4 MG/2ML IJ SOLN: 4.0000 mg | Freq: Four times a day (QID) | INTRAMUSCULAR | Status: DC | PRN

## 2015-10-06 MED ORDER — ATORVASTATIN CALCIUM 10 MG PO TABS
10.0000 mg | ORAL_TABLET | Freq: Every day | ORAL | Status: DC
  Administered 2015-10-06 – 2015-10-08 (×3): 10 mg via ORAL
  Filled 2015-10-06 (×3): qty 1

## 2015-10-06 MED ORDER — SERTRALINE HCL 50 MG PO TABS
50.0000 mg | ORAL_TABLET | Freq: Every day | ORAL | Status: DC
Start: 2015-10-06 — End: 2015-10-07
  Administered 2015-10-06 – 2015-10-07 (×2): 50 mg via ORAL
  Filled 2015-10-06 (×2): qty 1

## 2015-10-06 MED ORDER — HYDROCODONE-ACETAMINOPHEN 5-325 MG PO TABS: 1.0000 | ORAL_TABLET | ORAL | Status: DC | PRN

## 2015-10-06 MED ORDER — ACETAMINOPHEN 650 MG RE SUPP: 650.0000 mg | Freq: Four times a day (QID) | RECTAL | Status: DC | PRN

## 2015-10-06 MED ORDER — SODIUM CHLORIDE 0.9 % IV SOLN
INTRAVENOUS | Status: AC
  Administered 2015-10-06: 03:00:00 via INTRAVENOUS

## 2015-10-06 MED ORDER — BISACODYL 5 MG PO TBEC: 5.0000 mg | DELAYED_RELEASE_TABLET | Freq: Every day | ORAL | Status: DC | PRN

## 2015-10-06 MED ORDER — ASPIRIN EC 81 MG PO TBEC
81.0000 mg | DELAYED_RELEASE_TABLET | Freq: Every day | ORAL | Status: DC
Start: 2015-10-06 — End: 2015-10-09
  Administered 2015-10-06 – 2015-10-09 (×4): 81 mg via ORAL
  Filled 2015-10-06 (×4): qty 1

## 2015-10-06 MED ORDER — DONEPEZIL HCL 5 MG PO TABS
10.0000 mg | ORAL_TABLET | Freq: Every day | ORAL | Status: DC
Start: 2015-10-06 — End: 2015-10-09
  Administered 2015-10-06 – 2015-10-08 (×4): 10 mg via ORAL
  Filled 2015-10-06 (×4): qty 2

## 2015-10-06 MED ORDER — ENSURE ENLIVE PO LIQD
237.0000 mL | Freq: Three times a day (TID) | ORAL | Status: DC
  Administered 2015-10-06 – 2015-10-09 (×9): 237 mL via ORAL

## 2015-10-06 MED ORDER — ONDANSETRON HCL 4 MG PO TABS: 4.0000 mg | ORAL_TABLET | Freq: Four times a day (QID) | ORAL | Status: DC | PRN

## 2015-10-06 MED ORDER — SODIUM CHLORIDE 0.9 % IV SOLN
Freq: Once | INTRAVENOUS | Status: AC
  Administered 2015-10-06: 11:00:00 via INTRAVENOUS

## 2015-10-06 MED ORDER — ALBUTEROL SULFATE (2.5 MG/3ML) 0.083% IN NEBU: 2.5000 mg | INHALATION_SOLUTION | RESPIRATORY_TRACT | Status: DC | PRN

## 2015-10-06 MED ORDER — SODIUM CHLORIDE 0.9% FLUSH
3.0000 mL | Freq: Two times a day (BID) | INTRAVENOUS | Status: DC
  Administered 2015-10-06 – 2015-10-09 (×8): 3 mL via INTRAVENOUS

## 2015-10-06 MED ORDER — ENSURE ENLIVE PO LIQD
237.0000 mL | Freq: Two times a day (BID) | ORAL | Status: DC
  Administered 2015-10-06: 237 mL via ORAL

## 2015-10-06 MED ORDER — TAMSULOSIN HCL 0.4 MG PO CAPS
0.4000 mg | ORAL_CAPSULE | Freq: Every day | ORAL | Status: DC
  Administered 2015-10-06 – 2015-10-09 (×4): 0.4 mg via ORAL
  Filled 2015-10-06 (×4): qty 1

## 2015-10-06 MED ORDER — SENNA 8.6 MG PO TABS
1.0000 | ORAL_TABLET | Freq: Every morning | ORAL | Status: DC
  Administered 2015-10-06 – 2015-10-09 (×4): 8.6 mg via ORAL
  Filled 2015-10-06 (×4): qty 1

## 2015-10-06 NOTE — Care Management Obs Status (Signed)
[place] NOTIFICATION   Patient Details  Name: Grant Lara MRN: [id number] Date of Birth: 1941/01/18   Medicare Observation Status Notification Given:  Yes    Carles Collet, RN 10/06/2015, 11:23 AM

## 2015-10-06 NOTE — Progress Notes (Addendum)
Initial Nutrition Assessment  DOCUMENTATION CODES:   Underweight, Severe malnutrition in context of chronic illness  INTERVENTION:   -Ensure Enlive po TID, each supplement provides 350 kcal and 20 grams of protein  NUTRITION DIAGNOSIS:   Malnutrition related to chronic illness as evidenced by severe depletion of body fat, severe depletion of muscle mass.  GOAL:   Patient will meet greater than or equal to 90% of their needs  MONITOR:   PO intake, Supplement acceptance, Labs, Weight trends, Skin, I & O's  REASON FOR ASSESSMENT:   Consult Assessment of nutrition requirement/status  ASSESSMENT:   Grant Lara is a 75 y.o. male with medical history significant for hypertension, coronary artery disease with stents, chronic systolic CHF, history of embolic stroke, history of pulmonary embolism, COPD, and dementia who presents the emergency department from his SNF for evaluation of the near syncopal episode just prior to arrival.  Pt admitted with near syncope and HCAP. Pt is familiar to this RD, due to recent discharge on 10/04/15. He is resident of [facility].   Spoke with pt at bedside. Diet was just advanced to Heart healthy after being NPO for abdominal x-ray. Pt confirms good appetite both currently and PTA ("I'm ready for some food"). Pt reports he consumes Ensure supplements at SNF and would like to continue them while here.   Pt endorses weight loss, but is unable to quantify amount of time frame for weight loss. He shares that his UBW was 160# "when I was still working". He reports his clothes have gotten looser over the years. Accurate hx was difficult to obtain with pt, as he would sometime answer questions appropriately, but answer inappropriately at times (ex when asked about pt's ability to chew foods with multiple missing teeth, pt replied "I take my glasses off when I eat").   Nutrition-Focused physical exam completed. Findings are severe fat depletion, severe muscle  depletion, and no edema.   Case discussed with RN.  Labs reviewed: Na: 133 (on IV supplementation), CBGS: 246.   Diet Order:  Diet Heart Room service appropriate?: Yes; Fluid consistency:: Thin; Fluid restriction:: 1500 mL Fluid  Skin:  Reviewed, no issues  Last BM:  PTA  Height:   Ht Readings from Last 1 Encounters:  10/06/15 5' 9"  (1.753 m)    Weight:   Wt Readings from Last 1 Encounters:  10/06/15 118 lb 6.2 oz (53.7 kg)    Ideal Body Weight:  72.7 kg  BMI:  Body mass index is 17.47 kg/(m^2).  Estimated Nutritional Needs:   Kcal:  1600-1800  Protein:  80-95 grams  Fluid:  1.6-1.8 L  EDUCATION NEEDS:   Education needs addressed  Keiston Manley A. Jimmye Norman, RD, LDN, CDE Pager: [phone] After hours Pager: [phone]

## 2015-10-06 NOTE — Progress Notes (Signed)
Echocardiogram 2D Echocardiogram has been performed.  Tresa Res 10/06/2015, 2:23 PM

## 2015-10-06 NOTE — Progress Notes (Signed)
Patient seen and examined. Admitted after midnight secondary to near syncope event. Currently w/o acute complaints. Patient chronically ill in appearance and just recently discharge secondary to HCAP vs aspiration PNA. Please refer to H&P written by Dr. Myna Hidalgo for further info/details on admission.  Plan: -given low Hgb and hx of CAD will transfuse 2 units of PRBC's -follow echo results -antibiotics changed to cover potential aspiration component  -follow clinical response -at discharge once stable will be back to [facility]  Barton Dubois [phone]

## 2015-10-06 NOTE — H&P (Signed)
History and Physical    Grant Lara:[id number] DOB: 06-26-1940 DOA: 10/05/2015  PCP: [name], MD   Patient coming from: SNF  Chief Complaint: Near syncope   HPI: Grant Lara is a 75 y.o. male with medical history significant for hypertension, coronary artery disease with stents, chronic systolic CHF, history of embolic stroke, history of pulmonary embolism, COPD, and dementia who presents the emergency department from his SNF for evaluation of the near syncopal episode just prior to arrival. Patient was recently admitted to this institution from 09/29/2015 to 10/04/2015, initially presenting with presyncope, but diagnosed with multifocal pneumonia and treated with broad-spectrum antibiotics, ultimately being discharged back to the SNF in much improved and stable condition. Two weeks prior to that admission, the patient was hospitalized for anemia with hemoglobin of 7.0, requiring transfusion 2 units packed red blood cells, and with workup negative for GI blood loss. He had reportedly been in his usual state back to the SNF today until becoming extremely lightheaded while standing. He was helped down to a seated position without actual loss of consciousness or fall. Patient reports a momentary loss in vision during the episode but denies associated chest pain, palpitations, or headache. He has an ICD which was recently interrogated and no problems identified. Patient is currently under treatment with Levaquin for his resolving HCAP. He denies fevers or chills, dyspnea, or cough.  ED Course: Upon arrival to the ED, patient is found to be afebrile, saturating well on room air, and with vital signs stable. EKG demonstrates an atrial paced rhythm with LVH by voltage criteria and secondary repolarization abnormality that is unchanged from priors. Chest x-ray demonstrates retrocardiac density suspected be atelectasis versus infiltrate. CMP features a serum creatinine 1.34, up from an  apparent baseline of ~1.1. AST and ALT are elevated to a value of 155 and 263, respectively. CBC features a leukocytosis to 17,000, up slightly from time recent discharge, and hemoglobin of 7.4, down from recent values in the 8-9 range following transfusion 1 month ago. MCV is 72.5. Troponin is 0.02. Patient remained hemodynamically stable in the emergency department and admission for further evaluation of presyncope was requested. Patient will be observed on the telemetry unit for ongoing evaluation and management of near syncope.  Review of Systems:  All other systems reviewed and apart from HPI, are negative.  Past Medical History  Diagnosis Date  . Hypertension   . Dilated cardiomyopathy ([place])     2/12: EF 30-35%, trivial AI, mild RAE.  EF 2014 40 -45%  . CAD (coronary artery disease)     LHC 9/05 with Dr. Einar Gip:  dLM 20-30%, LAD 85%, oD1 20-30%.  PCI:  Taxus DES to LAD; Dx jailed and tx with POBA.  Last myoview 12/10: inf scar, no ischemia, EF 29%.  . DVT (deep venous thrombosis) ([place])   . Pulmonary embolus (H[facility])     chronic coumadin  . 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 PFO (patent foramen ovale)     Not mentioned on 2014 echo.  [name] HLD (hyperlipidemia)   . Crohn's disease ([place])   . Nephrolithiasis   . Diabetes mellitus     11/05/11 "borderline; don't take medications"  . Stroke [facility]) 1993    "left arm can't hold steady; leg too"  . Arthritis     "used to have a touch in my legs"  . Memory difficulties   . Swelling of both ankles      09-22-13 occ.feet, but denies pain.  . Bradycardia  06/01/2014  . Dementia   . Hyperlipidemia   . CHF (congestive heart failure) ([place])   . Pneumonia ~ 2011    09-22-13 denies any recent SOB or breathing problems  . HCAP (healthcare-associated pneumonia) 09/29/2015  . AICD (automatic cardioverter/defibrillator) present     Past Surgical History  Procedure Laterality Date  . Colon surgery  1994; 1996    "for Crohn's disease"  . Appendectomy    .  Implantable cardioverter defibrillator implant N/A 06/02/2014    Procedure: IMPLANTABLE CARDIOVERTER DEFIBRILLATOR IMPLANT;  Surgeon: [name], MD;  Location: [facility] CATH LAB;  Service: Cardiovascular;  Laterality: N/A;     reports that he has been smoking Cigarettes.  He has a 26.5 pack-year smoking history. He has never used smokeless tobacco. He reports that he does not drink alcohol or use illicit drugs.  No Known Allergies  Family History  Problem Relation Age of Onset  . Diabetes type II Mother   . CAD Father   . Diabetes type II Brother      Prior to Admission medications   Medication Sig Start Date End Date Taking? Authorizing Provider  acetaminophen (TYLENOL) 500 MG tablet Take 2 tablets (1,000 mg total) by mouth 2 (two) times daily. 05/14/15  Yes Gerlene Fee, NP  aspirin 81 MG tablet Take 81 mg by mouth daily.   Yes Historical Provider, MD  atorvastatin (LIPITOR) 10 MG tablet Take 1 tablet (10 mg total) by mouth daily at 6 PM. 04/18/14  Yes Maryann Mikhail, DO  donepezil (ARICEPT) 10 MG tablet Take 10 mg by mouth at bedtime.  02/23/15  Yes Historical Provider, MD  feeding supplement, ENSURE ENLIVE, (ENSURE ENLIVE) LIQD Take 237 mLs by mouth 2 (two) times daily between meals. 10/04/15  Yes Hosie Poisson, MD  latanoprost (XALATAN) 0.005 % ophthalmic solution Place 1 drop into both eyes at bedtime. 06/29/13  Yes Historical Provider, MD  levalbuterol Penne Lash) 0.63 MG/3ML nebulizer solution Take 3 mLs (0.63 mg total) by nebulization every 6 (six) hours as needed for wheezing or shortness of breath. 09/11/15  Yes Ripudeep Krystal Eaton, MD  levofloxacin (LEVAQUIN) 750 MG tablet Take 1 tablet (750 mg total) by mouth daily. 10/04/15  Yes Hosie Poisson, MD  rivaroxaban (XARELTO) 20 MG TABS tablet Take 1 tablet (20 mg total) by mouth daily with supper. 06/23/14  Yes Hosie Poisson, MD  senna (SENOKOT) 8.6 MG TABS tablet Take 1 tablet by mouth every morning.   Yes Historical Provider, MD  sertraline  (ZOLOFT) 50 MG tablet Take 50 mg by mouth every morning.  05/18/14  Yes Historical Provider, MD  tamsulosin (FLOMAX) 0.4 MG CAPS capsule Take 1 capsule (0.4 mg total) by mouth daily. 04/18/14  Yes Maryann Mikhail, DO  ALPRAZolam (XANAX) 0.5 MG tablet Take 1 tablet (0.5 mg total) by mouth at bedtime as needed. 10/04/15   Hosie Poisson, MD    Physical Exam: Filed Vitals:   10/05/15 2117 10/05/15 2245 10/05/15 2345 10/06/15 0045  BP: 125/75 116/73 122/77 130/67  Pulse: 70 151 73 74  Temp: 99.4 F (37.4 C)     TempSrc: Oral     Resp:  26 18 23   Height: 5' 9"  (1.753 m)     Weight: 54.432 kg (120 lb)     SpO2: 100% 93% 97% 97%      Constitutional: NAD, calm, comfortable, cachectic  Eyes: PERTLA, lids and conjunctivae normal ENMT: Mucous membranes are moist. Posterior pharynx clear of any exudate or lesions.  Neck: normal, supple, no masses, no thyromegaly Respiratory: Coarse rhonchi b/l, no wheezing, no crackles. Normal respiratory effort.    Cardiovascular: S1 & S2 heard, regular rate and rhythm, no significant murmurs / rubs / gallops. No extremity edema. No significant JVD. Abdomen: No distension, no tenderness, no masses palpated. Bowel sounds normal.  Musculoskeletal: no clubbing / cyanosis. No joint deformity upper and lower extremities. Normal muscle tone.  Skin: no significant rashes, lesions, ulcers. Warm, dry, well-perfused. Neurologic: CN 2-12 grossly intact. Sensation intact, DTR normal. Subtle weakness in left upper and lower extremities.  Psychiatric: Normal judgment and insight. Alert and oriented x 3. Normal mood and affect.     Labs on Admission: I have personally reviewed following labs and imaging studies  CBC:  Recent Labs Lab 09/29/15 1210 09/30/15 0512 10/02/15 0634 10/05/15 2257  WBC 19.2* 18.4* 16.1* 17.0*  NEUTROABS 15.6*  --   --  14.4*  HGB 9.2* 8.4* 8.2* 7.4*  HCT 32.1* 29.6* 29.2* 26.3*  MCV 73.6* 72.2* 72.6* 72.5*  PLT 309 332 371 595   Basic  Metabolic Panel:  Recent Labs Lab 09/29/15 1210 09/30/15 0512 10/02/15 0634 10/03/15 1538 10/05/15 2257  NA 136 135 137 135 137  K 4.0 4.0 4.3 4.0 5.1  CL 105 101 104 103 104  CO2 23 25 26 25 26   GLUCOSE 126* 103* 114* 207* 141*  BUN 23* 20 18 21* 17  CREATININE 1.61* 1.22 1.10 1.21 1.34*  CALCIUM 8.7* 8.8* 8.8* 8.7* 9.0   GFR: Estimated Creatinine Clearance: 36.1 mL/min (by C-G formula based on Cr of 1.34). Liver Function Tests:  Recent Labs Lab 09/30/15 0512 10/05/15 2257  AST 19 155*  ALT 37 263*  ALKPHOS 109 170*  BILITOT 1.0 0.3  PROT 6.3* 7.0  ALBUMIN 2.5* 2.3*   No results for input(s): LIPASE, AMYLASE in the last 168 hours. No results for input(s): AMMONIA in the last 168 hours. Coagulation Profile: No results for input(s): INR, PROTIME in the last 168 hours. Cardiac Enzymes: No results for input(s): CKTOTAL, CKMB, CKMBINDEX, TROPONINI in the last 168 hours. BNP (last 3 results) No results for input(s): PROBNP in the last 8760 hours. HbA1C: No results for input(s): HGBA1C in the last 72 hours. CBG:  Recent Labs Lab 10/03/15 1653 10/03/15 2157 10/04/15 0741 10/04/15 1229 10/05/15 2121  GLUCAP 198* 163* 128* 184* 171*   Lipid Profile: No results for input(s): CHOL, HDL, LDLCALC, TRIG, CHOLHDL, LDLDIRECT in the last 72 hours. Thyroid Function Tests: No results for input(s): TSH, T4TOTAL, FREET4, T3FREE, THYROIDAB in the last 72 hours. Anemia Panel: No results for input(s): VITAMINB12, FOLATE, FERRITIN, TIBC, IRON, RETICCTPCT in the last 72 hours. Urine analysis:    Component Value Date/Time   COLORURINE YELLOW 09/29/2015 1508   APPEARANCEUR CLEAR 09/29/2015 1508   LABSPEC 1.019 09/29/2015 1508   PHURINE 6.0 09/29/2015 1508   GLUCOSEU NEGATIVE 09/29/2015 1508   HGBUR NEGATIVE 09/29/2015 1508   BILIRUBINUR NEGATIVE 09/29/2015 1508   KETONESUR NEGATIVE 09/29/2015 1508   PROTEINUR NEGATIVE 09/29/2015 1508   UROBILINOGEN 1.0 12/21/2014 1520    NITRITE NEGATIVE 09/29/2015 1508   LEUKOCYTESUR SMALL* 09/29/2015 1508   Sepsis Labs: @LABRCNTIP (procalcitonin:4,lacticidven:4) ) Recent Results (from the past 240 hour(s))  Culture, blood (Routine X 2) w Reflex to ID Panel     Status: None (Preliminary result)   Collection Time: 10/01/15  6:42 PM  Result Value Ref Range Status   Specimen Description BLOOD RIGHT ARM  Final   Special Requests  BOTTLES DRAWN AEROBIC ONLY 5CC  Final   Culture NO GROWTH 4 DAYS  Final   Report Status PENDING  Incomplete  Culture, blood (Routine X 2) w Reflex to ID Panel     Status: None (Preliminary result)   Collection Time: 10/01/15  6:50 PM  Result Value Ref Range Status   Specimen Description BLOOD RIGHT HAND  Final   Special Requests IN PEDIATRIC BOTTLE 2CC  Final   Culture NO GROWTH 4 DAYS  Final   Report Status PENDING  Incomplete     Radiological Exams on Admission: Dg Chest 2 View  10/05/2015  CLINICAL DATA:  75 year old male with near syncope EXAM: CHEST  2 VIEW COMPARISON:  Chest radiograph dated 09/30/2015 and chest CT dated 09/29/2015 FINDINGS: Two views of the chest demonstrate an area of increased density in the retrocardiac region, likely atelectasis/ scarring versus infiltrate. There is mild blunting of the left costophrenic angle which may represent trace pleural effusion. No pneumothorax. The cardiac silhouette is within normal limits. Left pectoral AICD device. There is degenerative changes of the spine. No acute osseous pathology. IMPRESSION: Retrocardiac density may represent atelectasis versus infiltrate. Electronically Signed   By: Anner Crete M.D.   On: 10/05/2015 22:58    EKG: Independently reviewed. Accellerated junctional rhythm, LVH by voltage criteria with secondary repolarization abnormality unchanged from priors  Assessment/Plan  1. Near syncope  - Pt reports lightheadedness and near-syncope upon standing for ~1 month and was just discharged after presenting with the  same  - Suspect a neurally-mediated reflex syncope  - Monitor on telemetry, check orthostatic vitals, check TTE  - Fall precautions   2. HCAP  - Diagnosed at time of admission on 6/16 and treated with broad-spectrums before returning to SNF with Levaquin  - WBC is 17.0 on admission, up from 16.1 at time of recent discharge, but no fever or hypoxia  - Continue Levaquin to complete the course; check PCT   3. Chronic systolic CHF  - Appears dehydrated at time of admission  - TTE (05/21/14) with EF 20-25%, diffuse hypokinesis  - Not on diuretic or beta-blocker  - Follow daily wts and I/O's  - Update TTE in setting of near-syncope   4. CAD - No anginal complaints; no change in EKG; troponin wnl  - No longer on ASA d/t anemia requiring recent transfusions  - Continue Lipitor    5. Microcytic anemia   - Hgb 7.4; MCV 72.5 on admission  - Admitted in May 2017 for anemia workup and FOBT was negative x2  - Required transfusion of 2 units pRBCs during that admission  - No sign of active blood-loss; etiology still uncertain  - Type and screen performed; plan to transfuse for Hgb <7 or active bleed    6. Dementia - Appears to be stable; continue current management with Aricept    7. Depression  - Appears to be stable on admission  - Continue current management with Zoloft    8. Hx of PE   - Continue Xarelto    9. CKD stage III  - SCr 1.34 on admission, up from apparent baseline of ~1.1  - Appears dehydrated on admission; providing gentle IVF hydration with NS at 75 cc/hr overnight  - Avoid nephrotoxins; renally-dose medications as applicable    10. COPD  - Stable with no hypoxia or increase in dyspnea or cough  - Continue prn Xopenex    11. Protein-calorie malnutrition  - Pt appears cachectic with bitemporal wasting, BMI  17.5, albumin 2.3  - Continue nutritional supplements  - Dietary consultation requested    DVT prophylaxis: Xarelto   Code Status: Full Family Communication:  Discussed with patient  Disposition Plan: Observe on telemetry   Consults called: None   Admission status: Observation    Vianne Bulls, MD [facility] Pager [phone]  If 7PM-7AM, please contact night-coverage [email or web address] Password [facility]  10/06/2015, 12:57 AM

## 2015-10-06 NOTE — Care Management Note (Signed)
Case Management Note  Patient Details  Name: MASAI KIDD MRN: [id number] Date of Birth: 1940/09/20  Subjective/Objective:                 Pt DC's 6/21 to [facility] was admitted for Asp PNA. In obs for near syncopal event, Hgb 7.4 EF20% will receive transfusion.    Action/Plan:  Anticipate DC to SNF when stable.  Expected Discharge Date:                  Expected Discharge Plan:  [place] [name])  In-House Referral:  Clinical Social Work  Discharge planning Services  CM Consult  Post Acute Care Choice:    Choice offered to:  Patient  DME Arranged:    DME Agency:     HH Arranged:    [place] Agency:     Status of Service:  In process, will continue to follow  If discussed at Long Length of Stay Meetings, dates discussed:    Additional Comments:  Carles Collet, RN 10/06/2015, 11:24 AM

## 2015-10-06 NOTE — Progress Notes (Signed)
Noted that patient does have history of diabetes. HgbA1C is 6.8%. Not taking any medications for DM at this time.  May benefit from an oral DM medication at discharge. Will continue to monitor blood sugars while in the hospital. Harvel Ricks RN BSN CDE

## 2015-10-06 NOTE — Progress Notes (Signed)
NURSING PROGRESS NOTE  Grant Mussa RoyalMRN: [id number] Admission Data: 10/06/2015 at 2:15AM Attending Provider: Barton Dubois, MD PCP: [name], MD Code status: Full  Allergies: No Known Allergies  Past Medical History:  Past Medical History  Diagnosis Date  . Hypertension   . Dilated cardiomyopathy ([place])     2/12: EF 30-35%, trivial AI, mild RAE.  EF 2014 40 -45%  . CAD (coronary artery disease)     LHC 9/05 with Dr. Einar Gip:  dLM 20-30%, LAD 85%, oD1 20-30%.  PCI:  Taxus DES to LAD; Dx jailed and tx with POBA.  Last myoview 12/10: inf scar, no ischemia, EF 29%.  . DVT (deep venous thrombosis) ([place])   . Pulmonary embolus (HCC)     chronic coumadin  . 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 PFO (patent foramen ovale)     Not mentioned on 2014 echo.  [name] HLD (hyperlipidemia)   . Crohn's disease ([place])   . Nephrolithiasis   . Diabetes mellitus     11/05/11 "borderline; don't take medications"  . Stroke [facility]) 1993    "left arm can't hold steady; leg too"  . Arthritis     "used to have a touch in my legs"  . Memory difficulties   . Swelling of both ankles      09-22-13 occ.feet, but denies pain.  . Bradycardia 06/01/2014  . Dementia   . Hyperlipidemia   . CHF (congestive heart failure) ([place])   . Pneumonia ~ 2011    09-22-13 denies any recent SOB or breathing problems  . HCAP (healthcare-associated pneumonia) 09/29/2015  . AICD (automatic cardioverter/defibrillator) present     Past Surgical History:  Past Surgical History  Procedure Laterality Date  . Colon surgery  1994; 1996    "for Crohn's disease"  . Appendectomy    . Implantable cardioverter defibrillator implant N/A 06/02/2014    Procedure: IMPLANTABLE CARDIOVERTER DEFIBRILLATOR IMPLANT;  Surgeon: [name], MD;  Location: [facility] CATH LAB;  Service: Cardiovascular;  Laterality: N/A;    Grant Lara is a 75 y.o. male patient, arrived to floor in room [phone] via stretcher, transferred from ED. Patient alert and  oriented X 3. No acute distress noted. Denies pain.  Vital signs: Oral temperature 98.4 F (36.9 C), Blood pressure 113/62, Pulse 69, RR 18, SpO2 97 % on room air. Height 5'9", weight 53.7 kg.   Cardiac monitoring: Telemetry box 5W # 9 in place.  IV access: Right anterior forearm infusing normal saline at 62m/hr; condition patent and no redness; dressing is clean, dry, and intact   Skin: intact, no pressure ulcer noted in sacral area.   Patient's ID armband verified with patient and in place. Information packet given to patient. Fall risk assessed, SR up X2, patient able to verbalize understanding of risks associated with falls and to call nurse or staff to assist before getting out of bed. Patient oriented to room and equipment. Call bell within reach.

## 2015-10-07 DIAGNOSIS — E43 Unspecified severe protein-calorie malnutrition: Secondary | ICD-10-CM

## 2015-10-07 DIAGNOSIS — I2510 Atherosclerotic heart disease of native coronary artery without angina pectoris: Secondary | ICD-10-CM | POA: Diagnosis not present

## 2015-10-07 DIAGNOSIS — R55 Syncope and collapse: Secondary | ICD-10-CM | POA: Diagnosis not present

## 2015-10-07 DIAGNOSIS — D649 Anemia, unspecified: Secondary | ICD-10-CM | POA: Diagnosis not present

## 2015-10-07 DIAGNOSIS — N183 Chronic kidney disease, stage 3 (moderate): Secondary | ICD-10-CM | POA: Diagnosis not present

## 2015-10-07 LAB — TYPE AND SCREEN
ABO/RH(D): B POS
ANTIBODY SCREEN: NEGATIVE
UNIT DIVISION: 0
Unit division: 0

## 2015-10-07 LAB — CBC
HCT: 31.7 % — ABNORMAL LOW (ref 39.0–52.0)
HEMOGLOBIN: 9.8 g/dL — AB (ref 13.0–17.0)
MCH: 22 pg — AB (ref 26.0–34.0)
MCHC: 30 g/dL (ref 30.0–36.0)
MCV: 73.4 fL — AB (ref 78.0–100.0)
Platelets: 362 10*3/uL (ref 150–400)
RBC: 4.32 MIL/uL (ref 4.22–5.81)
RDW: 19.6 % — ABNORMAL HIGH (ref 11.5–15.5)
WBC: 13.3 10*3/uL — AB (ref 4.0–10.5)

## 2015-10-07 LAB — CBC AND DIFFERENTIAL
HEMATOCRIT: 32 % — AB (ref 41–53)
Hemoglobin: 9.8 g/dL — AB (ref 13.5–17.5)
PLATELETS: 362 10*3/uL (ref 150–399)
WBC: 13.3 10^3/mL

## 2015-10-07 LAB — GLUCOSE, CAPILLARY: Glucose-Capillary: 143 mg/dL — ABNORMAL HIGH (ref 65–99)

## 2015-10-07 LAB — URINE CULTURE: Culture: >=100000 — AB

## 2015-10-07 MED ORDER — SERTRALINE HCL 25 MG PO TABS
25.0000 mg | ORAL_TABLET | Freq: Every day | ORAL | Status: DC
  Administered 2015-10-08 – 2015-10-09 (×2): 25 mg via ORAL
  Filled 2015-10-07 (×2): qty 1

## 2015-10-07 NOTE — Progress Notes (Signed)
[facility] PROGRESS NOTE  Grant Lara SVX:[id number] DOB: 01-14-1941 DOA: 10/05/2015 PCP: [name], MD Interim summary and HPI 75 y.o. male with medical history significant for hypertension, coronary artery disease with stents, chronic systolic CHF, history of embolic stroke, history of pulmonary embolism, COPD, and dementia who presents the emergency department from his SNF for evaluation of the near syncopal episode just prior to arrival. Patient was recently admitted to this institution from 09/29/2015 to 10/04/2015, initially presenting with presyncope, but diagnosed with multifocal pneumonia and treated with broad-spectrum antibiotics, ultimately being discharged back to the SNF in much improved and stable condition. Two weeks prior to that admission, the patient was hospitalized for anemia with hemoglobin of 7.0, requiring transfusion 2 units packed red blood cells, and with workup negative for GI blood loss. He had reportedly been in his usual state back to the SNF today until becoming extremely lightheaded while standing. He was helped down to a seated position without actual loss of consciousness or fall. Patient reports a momentary loss in vision during the episode but denies associated chest pain, palpitations, or headache. He has an ICD which was recently interrogated and no problems identified. Patient is currently under treatment with Levaquin for his resolving HCAP. He denies fevers or chills, dyspnea, or cough.  Assessment/Plan: 1. Near syncope  -Pt reports lightheadedness and near-syncope upon standing for ~1 month and was just discharged after presenting with the same  -Suspect a neurally-mediated reflex syncope along with severe deconditioning  -echo w/o abnormalities to explain symptoms -ICD has not fire -will discontinue telemetry -reassess labs (renal function, electrolytes and Hgb in am) -will benefit of palliative care to follow him at SNF  2.  HCAP/aspiration PNA -Diagnosed at time of admission on 6/16 and treated with broad-spectrums before returning to SNF with Levaquin  -WBC is 17.0 on admission, up from 16.1 at time of recent discharge, but no fever or hypoxia  -most likely demargination and dehydration -abx's switch to augmentin to cover better for aspiration concerns   3. Chronic combined systolic/diastolic CHF  -Appears dehydrated at time of admission  -overall compensated and with echo demonstrating slight improvement in EF; see below   -Not on diuretic or beta-blocker  -Follow daily wts and I/O's   4. CAD -No anginal complaints; no change in EKG; troponin wnl  -continue ASA and Lipitor   5. Microcytic anemia  -Hgb 7.4; MCV 72.5 on admission  -given hx of CAD and chronic anticoagulation received 2 units of PRBC's -Hgb 9.8 after transfusion -will monitor  6. Dementia -At baseline -will continue aricept  7. Depression  -Appears to be stable on admission  -continue zoloft, but dose adjusted to decrease risk for orthostatic changes   8. Hx of PE  -Continue Xarelto -no overt bleeding appreciated    9. CKD stage III  -SCr 1.34 on admission, up from apparent baseline of ~1.1  -received gentle hydration and 2 units of PRBC's -will Avoid nephrotoxins; renally-dose medications as applicable -follow BMET in am   10. COPD  -Stable with no hypoxia or increase in dyspnea or cough  -Continue prn Xopenex   11. Protein-calorie malnutrition, Severe -Pt appears cachectic with bitemporal wasting, BMI 17.5, albumin 2.3  -Continue feeding supplements and will follow up recommendations from nutritional service   Code Status: Full Family Communication: no family at bedside Disposition Plan: back to SNF most likely tomorrow or Monday (base on facility availability to received patient over the weekend)   Consultants:  None  Procedures:  2-D echo - Left ventricle: The cavity size was normal.  Systolic function was  moderately to severely reduced. The estimated ejection fraction  was in the range of 30% to 35%. Diffuse hypokinesis. Doppler  parameters are consistent with abnormal left ventricular  relaxation (grade 1 diastolic dysfunction). There was no evidence  of elevated ventricular filling pressure by Doppler parameters. - Aortic valve: There was mild regurgitation. - Aortic root: The aortic root was normal in size. - Mitral valve: Structurally normal valve. There was mild  regurgitation. - Left atrium: The atrium was mildly dilated. - Right ventricle: Pacer wire or catheter noted in right ventricle.  Systolic function was normal. - Right atrium: The atrium was moderately dilated. Pacer wire or  catheter noted in right atrium. - Tricuspid valve: There was mild regurgitation. - Pulmonary arteries: Systolic pressure was mildly increased. PA  peak pressure: 39 mm Hg (S). - Inferior vena cava: The vessel was dilated. The respirophasic  diameter changes were blunted (< 50%), consistent with elevated  central venous pressure. - Pericardium, extracardiac: There was no pericardial effusion.  Impressions: - When compared to the prior study from 05/31/14 the LVEF has  improved, now 30-35% with diffuse hypokinesis.  Antibiotics:  Augmentin   HPI/Subjective: Afebrile, denies CP and SOB. Slightly lightheaded this morning.   Objective: Filed Vitals:   10/07/15 0619 10/07/15 1432  BP: 110/68 111/66  Pulse: 66 66  Temp: 98.2 F (36.8 C) 98.4 F (36.9 C)  Resp: 20 20    Intake/Output Summary (Last 24 hours) at 10/07/15 1717 Last data filed at 10/07/15 1253  Gross per 24 hour  Intake    895 ml  Output   2500 ml  Net  -1605 ml   Filed Weights   10/05/15 2117 10/06/15 0522 10/07/15 0619  Weight: 54.432 kg (120 lb) 53.7 kg (118 lb 6.2 oz) 52.118 kg (114 lb 14.4 oz)    Exam:   General: awake and oriented X2; cachetic and chronically ill in appearance.  Denies CP and SOB. Patient reports feeling slightly lightheaded this morning, has not been out bed today yet.  Cardiovascular: S1 and S2, no rubs, no gallops, regular rate  Respiratory: scattered rhonchi, no wheezing   Abdomen: soft, NT, ND, positive BS  Musculoskeletal: no edema or cyanosis   Data Reviewed: Basic Metabolic Panel:  Recent Labs Lab 10/02/15 0634 10/03/15 1538 10/05/15 2257 10/06/15 0519  NA 137 135 137 133*  K 4.3 4.0 5.1 4.3  CL 104 103 104 102  CO2 26 25 26 25   GLUCOSE 114* 207* 141* 234*  BUN 18 21* 17 17  CREATININE 1.10 1.21 1.34* 1.33*  CALCIUM 8.8* 8.7* 9.0 8.5*   Liver Function Tests:  Recent Labs Lab 10/05/15 2257 10/06/15 0519  AST 155* 122*  ALT 263* 221*  ALKPHOS 170* 152*  BILITOT 0.3 0.2*  PROT 7.0 6.1*  ALBUMIN 2.3* 2.1*   CBC:  Recent Labs Lab 10/02/15 0634 10/05/15 2257 10/07/15 0543  WBC 16.1* 17.0* 13.3*  NEUTROABS  --  14.4*  --   HGB 8.2* 7.4* 9.8*  HCT 29.2* 26.3* 31.7*  MCV 72.6* 72.5* 73.4*  PLT 371 363 362   BNP (last 3 results)  Recent Labs  10/31/14 0140 09/29/15 1210 10/05/15 2257  BNP 42.0 88.7 77.1   CBG:  Recent Labs Lab 10/04/15 0741 10/04/15 1229 10/05/15 2121 10/06/15 0519 10/07/15 0650  GLUCAP 128* 184* 171* 246* 143*    Recent Results (from the past 240  hour(s))  Culture, blood (Routine X 2) w Reflex to ID Panel     Status: None   Collection Time: 10/01/15  6:42 PM  Result Value Ref Range Status   Specimen Description BLOOD RIGHT ARM  Final   Special Requests BOTTLES DRAWN AEROBIC ONLY 5CC  Final   Culture NO GROWTH 5 DAYS  Final   Report Status 10/06/2015 FINAL  Final  Culture, blood (Routine X 2) w Reflex to ID Panel     Status: None   Collection Time: 10/01/15  6:50 PM  Result Value Ref Range Status   Specimen Description BLOOD RIGHT HAND  Final   Special Requests IN PEDIATRIC BOTTLE [facility]  Final   Culture NO GROWTH 5 DAYS  Final   Report Status 10/06/2015 FINAL  Final   Urine culture     Status: Abnormal   Collection Time: 10/06/15 12:04 PM  Result Value Ref Range Status   Specimen Description URINE, CATHETERIZED  Final   Special Requests NONE  Final   Culture >=100,000 COLONIES/mL YEAST (A)  Final   Report Status 10/07/2015 FINAL  Final     Studies: Dg Chest 2 View  10/05/2015  CLINICAL DATA:  75 year old male with near syncope EXAM: CHEST  2 VIEW COMPARISON:  Chest radiograph dated 09/30/2015 and chest CT dated 09/29/2015 FINDINGS: Two views of the chest demonstrate an area of increased density in the retrocardiac region, likely atelectasis/ scarring versus infiltrate. There is mild blunting of the left costophrenic angle which may represent trace pleural effusion. No pneumothorax. The cardiac silhouette is within normal limits. Left pectoral AICD device. There is degenerative changes of the spine. No acute osseous pathology. IMPRESSION: Retrocardiac density may represent atelectasis versus infiltrate. Electronically Signed   By: Anner Crete M.D.   On: 10/05/2015 22:58   US Abdomen Limited Ruq  10/06/2015  CLINICAL DATA:  Elevated LFTs. EXAM: US ABDOMEN LIMITED - RIGHT UPPER QUADRANT COMPARISON:  05/02/2014. FINDINGS: Gallbladder: Small 9 mm gallstone noted. Gallbladder wall thickness 1.2 mm. No pericholecystic fluid collection. Negative Murphy sign. Common bile duct: Diameter: 4.7 mm Liver: 9 mm simple cyst is noted in the right lobe of the liver. Liver is slightly echogenic suggesting fatty infiltration. IMPRESSION: 1.  Small gallstone.  No biliary distention. 2. Liver is slightly echogenic suggesting fatty infiltration . Electronically Signed   By: Marcello Moores  Register   On: 10/06/2015 11:39    Scheduled Meds: . amoxicillin-clavulanate  1 tablet Oral Q8H  . aspirin EC  81 mg Oral Daily  . atorvastatin  10 mg Oral q1800  . donepezil  10 mg Oral QHS  . feeding supplement (ENSURE ENLIVE)  237 mL Oral TID BM  . latanoprost  1 drop Both Eyes QHS  .  rivaroxaban  20 mg Oral Q supper  . senna  1 tablet Oral q morning - 10a  . sertraline  50 mg Oral Daily  . sodium chloride flush  3 mL Intravenous Q12H  . tamsulosin  0.4 mg Oral Daily   Continuous Infusions:   Principal Problem:   Near syncope Active Problems:   Chronic systolic heart failure (HCC)   History of embolic stroke   DM2 (diabetes mellitus, type 2) (HCC)   Hx pulmonary embolism   Leukocytosis   CKD (chronic kidney disease) stage 3, GFR 30-59 ml/min   Coronary artery disease involving native coronary artery of native heart without angina pectoris   Depression due to dementia   Protein-calorie malnutrition, moderate (HCC)   Microcytic  anemia   Absolute anemia    Time spent: 35 minutes    Barton Dubois  [facility] Pager [phone]. If 7PM-7AM, please contact night-coverage at [email or web address], password [email or web address] 10/07/2015, 5:17 PM

## 2015-10-08 DIAGNOSIS — R55 Syncope and collapse: Secondary | ICD-10-CM

## 2015-10-08 DIAGNOSIS — Z86711 Personal history of pulmonary embolism: Secondary | ICD-10-CM | POA: Diagnosis not present

## 2015-10-08 DIAGNOSIS — E440 Moderate protein-calorie malnutrition: Secondary | ICD-10-CM

## 2015-10-08 DIAGNOSIS — I5022 Chronic systolic (congestive) heart failure: Secondary | ICD-10-CM | POA: Diagnosis not present

## 2015-10-08 LAB — GLUCOSE, CAPILLARY: Glucose-Capillary: 158 mg/dL — ABNORMAL HIGH (ref 65–99)

## 2015-10-08 LAB — BASIC METABOLIC PANEL
ANION GAP: 6 (ref 5–15)
BUN: 23 mg/dL — AB (ref 4–21)
BUN: 23 mg/dL — ABNORMAL HIGH (ref 6–20)
CALCIUM: 9.4 mg/dL (ref 8.9–10.3)
CO2: 29 mmol/L (ref 22–32)
CREATININE: 1.2 mg/dL (ref 0.61–1.24)
Chloride: 101 mmol/L (ref 101–111)
Creatinine: 1.2 mg/dL (ref 0.6–1.3)
GFR, EST NON AFRICAN AMERICAN: 57 mL/min — AB (ref >60)
GLUCOSE: 113 mg/dL
GLUCOSE: 113 mg/dL — AB (ref 65–99)
Potassium: 4.6 mmol/L (ref 3.5–5.1)
SODIUM: 136 mmol/L — AB (ref 137–147)
Sodium: 136 mmol/L (ref 135–145)

## 2015-10-08 LAB — CBC
HCT: 33.3 % — ABNORMAL LOW (ref 39.0–52.0)
Hemoglobin: 9.9 g/dL — ABNORMAL LOW (ref 13.0–17.0)
MCH: 22 pg — ABNORMAL LOW (ref 26.0–34.0)
MCHC: 29.7 g/dL — AB (ref 30.0–36.0)
MCV: 74 fL — ABNORMAL LOW (ref 78.0–100.0)
PLATELETS: 375 10*3/uL (ref 150–400)
RBC: 4.5 MIL/uL (ref 4.22–5.81)
RDW: 20.3 % — AB (ref 11.5–15.5)
WBC: 13.7 10*3/uL — AB (ref 4.0–10.5)

## 2015-10-08 LAB — PROCALCITONIN: PROCALCITONIN: 0.14 ng/mL

## 2015-10-08 LAB — CBC AND DIFFERENTIAL: WBC: 13.7 10^3/mL

## 2015-10-08 NOTE — NC FL2 (Signed)
[place] LEVEL OF CARE SCREENING TOOL     IDENTIFICATION  Patient Name: Grant Lara Birthdate: [birth year] Sex: male Admission Date (Current Location): 10/05/2015  [facility] and [place] Number:  Herbalist and Address:  The [place], [place] [address] [place], [place] [address]      Provider Number: [id number]  Attending Physician Name and Address:  Kelvin Cellar, MD  Relative Name and Phone Number:  [name], spouse, [phone]    Current Level of Care: Hospital Recommended Level of Care: [place] Prior Approval Number:    Date Approved/Denied:   PASRR Number: [id number]  Discharge Plan: SNF    Current Diagnoses: Patient Active Problem List   Diagnosis Date Noted  . Near syncope 10/06/2015  . Microcytic anemia 10/06/2015  . Absolute anemia   . Lactic acidosis 09/30/2015  . Protein-calorie malnutrition, moderate ([place]) 09/30/2015  . DM type 2 causing CKD stage 3 ([place]) 09/30/2015  . ICD (implantable cardioverter-defibrillator) in place 09/29/2015  . HCAP (healthcare-associated pneumonia) 09/29/2015  . Hypoxia 09/29/2015  . Vascular dementia without behavioral disturbance 09/25/2015  . Anemia 09/08/2015  . Fall 09/08/2015  . Depression 07/15/2015  . OA (osteoarthritis) 06/06/2015  . Bilateral lower extremity pain 05/14/2015  . BPH (benign prostatic hyperplasia) 04/10/2015  . Anxiety 04/10/2015  . FTT (failure to thrive) in adult 11/05/2014  . Mental status change 10/30/2014  . Protein-calorie malnutrition, severe ([place]) 10/30/2014  . Increased urinary frequency 10/30/2014  . Faintness   . Generalized weakness 09/01/2014  . 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 Cardiac syncope   . Other specified hypotension   . Chronic systolic CHF (congestive heart failure) ([place])   . Late effects of CVA (cerebrovascular accident) 07/16/2014  . Bradycardia 06/01/2014  . Syncope 05/30/2014  . Stroke ([place])   .  CVA (cerebral infarction)   . Hyperlipidemia   . Coronary artery disease involving native coronary artery of native heart without angina pectoris   . LVH (left ventricular hypertrophy)   . NSVT (nonsustained ventricular tachycardia) ([place]) 04/16/2014  . CKD (chronic kidney disease) stage 3, GFR 30-59 ml/min 04/16/2014  . Aphasia 04/15/2014  . Abnormal EKG   . Anemia, chronic disease 11/21/2013  . DM2 (diabetes mellitus, type 2) ([place]) 11/20/2013  . Hx pulmonary embolism 11/20/2013  . Leukocytosis 11/20/2013  . Hyperglycemia 08/22/2013  . Dehydration 08/21/2013  . ARF (acute renal failure) ([place]) 08/21/2013  . Abnormal ECG 08/21/2013  . Delirium 07/18/2013  . Small bowel obstruction ([place]) 07/13/2013  . Encounter for therapeutic drug monitoring 05/11/2013  . Crohn's disease ([place]) 02/16/2012  . Abnormal CT scan of lung 11/08/2011  . Bilateral leg and foot pain 11/06/2011  . HLD (hyperlipidemia) 03/28/2011  . Pulmonary embolism ([place]) 06/05/2010  . History of embolic stroke 18/84/1660  . DVT (deep venous thrombosis) ([place]) 06/05/2010  . EMPHYSEMATOUS BLEB 04/06/2010  . COPD (chronic obstructive pulmonary disease) ([place]) 04/05/2010  . Chronic systolic heart failure ([place]) 01/10/2010  . Memory loss 04/03/2009  . CHEST PAIN 03/08/2009  . Hypertensive heart disease with CHF (congestive heart failure) ([place]) 02/10/2009  . TOBACCO ABUSE 12/12/2008  . Dilated cardiomyopathy ([place]) 12/12/2008  . ABNORMAL ELECTROCARDIOGRAM 12/12/2008    Orientation RESPIRATION BLADDER Height & Weight     Self, Time, Place  Normal Continent Weight: 116 lb 3.2 oz (52.708 kg) Height:  5' 9"  (175.3 cm)  BEHAVIORAL SYMPTOMS/MOOD NEUROLOGICAL BOWEL NUTRITION STATUS      Continent Diet  AMBULATORY STATUS  COMMUNICATION OF NEEDS Skin   Extensive Assist Verbally Normal                       Personal Care Assistance Level of Assistance  Bathing, Feeding, Dressing Bathing Assistance: Maximum assistance Feeding  assistance: Limited assistance Dressing Assistance: Maximum assistance     Functional Limitations Info  Sight, Hearing, Speech Sight Info: Adequate Hearing Info: Adequate Speech Info: Adequate    SPECIAL CARE FACTORS FREQUENCY  PT (By licensed PT), OT (By licensed OT)                    Contractures Contractures Info: Not present    Additional Factors Info  Code Status, Allergies Code Status Info: FULL Allergies Info: NKA           Current Medications (10/08/2015):  This is the current hospital active medication list Current Facility-Administered Medications  Medication Dose Route Frequency Provider Last Rate Last Dose  . acetaminophen (TYLENOL) tablet 650 mg  650 mg Oral Q6H PRN Vianne Bulls, MD       Or  . acetaminophen (TYLENOL) suppository 650 mg  650 mg Rectal Q6H PRN Ilene Qua Opyd, MD      . albuterol (PROVENTIL) (2.5 MG/3ML) 0.083% nebulizer solution 2.5 mg  2.5 mg Nebulization Q4H PRN Vianne Bulls, MD      . amoxicillin-clavulanate (AUGMENTIN) 250-125 MG per tablet 1 tablet  1 tablet Oral Q8H Barton Dubois, MD   1 tablet at 10/08/15 [id number]  . aspirin EC tablet 81 mg  81 mg Oral Daily Vianne Bulls, MD   81 mg at 10/08/15 1016  . atorvastatin (LIPITOR) tablet 10 mg  10 mg Oral q1800 Vianne Bulls, MD   10 mg at 10/07/15 1843  . bisacodyl (DULCOLAX) EC tablet 5 mg  5 mg Oral Daily PRN Vianne Bulls, MD      . donepezil (ARICEPT) tablet 10 mg  10 mg Oral QHS Vianne Bulls, MD   10 mg at 10/07/15 2152  . feeding supplement (ENSURE ENLIVE) (ENSURE ENLIVE) liquid 237 mL  237 mL Oral TID BM Barton Dubois, MD   237 mL at 10/08/15 1016  . HYDROcodone-acetaminophen (NORCO/VICODIN) 5-325 MG per tablet 1-2 tablet  1-2 tablet Oral Q4H PRN Ilene Qua Opyd, MD      . latanoprost (XALATAN) 0.005 % ophthalmic solution 1 drop  1 drop Both Eyes QHS Vianne Bulls, MD   1 drop at 10/07/15 2152  . ondansetron (ZOFRAN) tablet 4 mg  4 mg Oral Q6H PRN Vianne Bulls, MD       Or   . ondansetron (ZOFRAN) injection 4 mg  4 mg Intravenous Q6H PRN Timothy S Opyd, MD      . polyethylene glycol (MIRALAX / GLYCOLAX) packet 17 g  17 g Oral Daily PRN Vianne Bulls, MD      . rivaroxaban (XARELTO) tablet 20 mg  20 mg Oral Q supper Vianne Bulls, MD   20 mg at 10/07/15 1843  . senna (SENOKOT) tablet 8.6 mg  1 tablet Oral q morning - 10a Ilene Qua Opyd, MD   8.6 mg at 10/08/15 1016  . sertraline (ZOLOFT) tablet 25 mg  25 mg Oral Daily Barton Dubois, MD   25 mg at 10/08/15 1016  . sodium chloride flush (NS) 0.9 % injection 3 mL  3 mL Intravenous Q12H Ilene Qua Opyd, MD   3 mL at 10/08/15 1016  .  tamsulosin (FLOMAX) capsule 0.4 mg  0.4 mg Oral Daily Vianne Bulls, MD   0.4 mg at 10/08/15 1016     Discharge Medications: Please see discharge summary for a list of discharge medications.  Relevant Imaging Results:  Relevant Lab Results:   Additional Information SSN:[id number] [place], LCSW

## 2015-10-08 NOTE — Clinical Social Work Note (Signed)
Clinical Social Work Assessment  Patient Details  Name: Grant Lara MRN: [id number] Date of Birth: [birth year]  Date of referral:  10/08/15               Reason for consult:  Facility Placement                Permission sought to share information with:  Case Manager, Family Supports Permission granted to share information::  Yes, Verbal Permission Granted  Name::     [name]  Agency::  [facility]  Relationship::  Spouse  Contact Information:  [phone]  Housing/Transportation Living arrangements for the past 2 months:  [place] of Information:  Spouse Patient Interpreter Needed:  None Criminal Activity/Legal Involvement Pertinent to Current Situation/Hospitalization:  No - Comment as needed Significant Relationships:  Adult Children, Spouse Lives with:  Facility Resident Do you feel safe going back to the place where you live?  Yes Need for family participation in patient care:  Yes (Comment)  Care giving concerns:  Wife [name] does not report any concerns at this time. Wife stated she would like for the patient to return back to [facility] upon discharge.      Social Worker assessment / plan:  CSW spoke with wife regarding disposition plan. CSW introduced self and explained CSW role. Wife [name] has requested for patient to return back to [facility]. CSW informed patient and family that [facility] will contact facility to explore any concerns regarding the patient returning. CSW will then fax over requested patient information. CSW to complete FL2 for MD signature.    Employment status:  Disabled (Comment on whether or not currently receiving Disability) Insurance information:  Managed Medicare PT Recommendations:  Not assessed at this time Information / Referral to community resources:     Patient/Family's Response to care:  Wife was appreciative of CSW services. Wife aware that unit CSW will follow back up on tomorrow.   Patient/Family's  Understanding of and Emotional Response to Diagnosis, Current Treatment, and Prognosis:  Wife expressed understanding of current treatment and diagnosis. Wife is hopefully for better progress.   Emotional Assessment Appearance:  Appears stated age Attitude/Demeanor/Rapport:  Unable to Assess Affect (typically observed):  Unable to Assess Orientation:  Oriented to Self, Oriented to Place Alcohol / Substance use:  Not Applicable Psych involvement (Current and /or in the community):  No (Comment)  Discharge Needs  Concerns to be addressed:  Discharge Planning Concerns Readmission within the last 30 days:  Yes Current discharge risk:  Physical Impairment Barriers to Discharge:  Continued Medical Work up   [employer], LCSW 10/08/2015, 2:21 PM

## 2015-10-08 NOTE — Progress Notes (Signed)
Pt c/o moderate sharp pain to RUQ and right lower lateral chest. VS WDL, NSR on cardiac monitor, tylenol 676m PO given for pain. MD paged at 1141. Instructions given to monitor patient.

## 2015-10-08 NOTE — NC FL2 (Deleted)
[place] LEVEL OF CARE SCREENING TOOL     IDENTIFICATION  Patient Name: Grant Lara Birthdate: [birth year] Sex: male Admission Date (Current Location): 10/05/2015  [facility] and [place] Number:      Facility and Address:         Provider Number: [id number]  Attending Physician Name and Address:  Kelvin Cellar, MD  Relative Name and Phone Number:       Current Level of Care:   Recommended Level of Care:   Prior Approval Number:    Date Approved/Denied:   PASRR Number:    Discharge Plan:      Current Diagnoses: Patient Active Problem List   Diagnosis Date Noted  . Near syncope 10/06/2015  . Microcytic anemia 10/06/2015  . Absolute anemia   . Lactic acidosis 09/30/2015  . Protein-calorie malnutrition, moderate ([place]) 09/30/2015  . DM type 2 causing CKD stage 3 ([place]) 09/30/2015  . ICD (implantable cardioverter-defibrillator) in place 09/29/2015  . HCAP (healthcare-associated pneumonia) 09/29/2015  . Hypoxia 09/29/2015  . Vascular dementia without behavioral disturbance 09/25/2015  . Anemia 09/08/2015  . Fall 09/08/2015  . Depression 07/15/2015  . OA (osteoarthritis) 06/06/2015  . Bilateral lower extremity pain 05/14/2015  . BPH (benign prostatic hyperplasia) 04/10/2015  . Anxiety 04/10/2015  . FTT (failure to thrive) in adult 11/05/2014  . Mental status change 10/30/2014  . Protein-calorie malnutrition, severe ([place]) 10/30/2014  . Increased urinary frequency 10/30/2014  . Faintness   . Generalized weakness 09/01/2014  . 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 Cardiac syncope   . Other specified hypotension   . Chronic systolic CHF (congestive heart failure) ([place])   . Late effects of CVA (cerebrovascular accident) 07/16/2014  . Bradycardia 06/01/2014  . Syncope 05/30/2014  . Stroke ([place])   . CVA (cerebral infarction)   . Hyperlipidemia   . Coronary artery disease involving native coronary artery of native heart without angina pectoris   .  LVH (left ventricular hypertrophy)   . NSVT (nonsustained ventricular tachycardia) ([place]) 04/16/2014  . CKD (chronic kidney disease) stage 3, GFR 30-59 ml/min 04/16/2014  . Aphasia 04/15/2014  . Abnormal EKG   . Anemia, chronic disease 11/21/2013  . DM2 (diabetes mellitus, type 2) ([place]) 11/20/2013  . Hx pulmonary embolism 11/20/2013  . Leukocytosis 11/20/2013  . Hyperglycemia 08/22/2013  . Dehydration 08/21/2013  . ARF (acute renal failure) ([place]) 08/21/2013  . Abnormal ECG 08/21/2013  . Delirium 07/18/2013  . Small bowel obstruction ([place]) 07/13/2013  . Encounter for therapeutic drug monitoring 05/11/2013  . Crohn's disease ([place]) 02/16/2012  . Abnormal CT scan of lung 11/08/2011  . Bilateral leg and foot pain 11/06/2011  . HLD (hyperlipidemia) 03/28/2011  . Pulmonary embolism ([place]) 06/05/2010  . History of embolic stroke 83/38/2505  . DVT (deep venous thrombosis) ([place]) 06/05/2010  . EMPHYSEMATOUS BLEB 04/06/2010  . COPD (chronic obstructive pulmonary disease) ([place]) 04/05/2010  . Chronic systolic heart failure ([place]) 01/10/2010  . Memory loss 04/03/2009  . CHEST PAIN 03/08/2009  . Hypertensive heart disease with CHF (congestive heart failure) ([place]) 02/10/2009  . TOBACCO ABUSE 12/12/2008  . Dilated cardiomyopathy ([place]) 12/12/2008  . ABNORMAL ELECTROCARDIOGRAM 12/12/2008    Orientation RESPIRATION BLADDER Height & Weight            Weight: 116 lb 3.2 oz (52.708 kg) Height:  5' 9"  (175.3 cm)  BEHAVIORAL SYMPTOMS/MOOD NEUROLOGICAL BOWEL NUTRITION STATUS           AMBULATORY STATUS COMMUNICATION OF NEEDS Skin  Personal Care Assistance Level of Assistance              Functional Limitations Info             SPECIAL CARE FACTORS FREQUENCY                       Contractures      Additional Factors Info                  Current Medications (10/08/2015):  This is the current hospital active medication list Current  Facility-Administered Medications  Medication Dose Route Frequency Provider Last Rate Last Dose  . acetaminophen (TYLENOL) tablet 650 mg  650 mg Oral Q6H PRN Vianne Bulls, MD       Or  . acetaminophen (TYLENOL) suppository 650 mg  650 mg Rectal Q6H PRN Ilene Qua Opyd, MD      . albuterol (PROVENTIL) (2.5 MG/3ML) 0.083% nebulizer solution 2.5 mg  2.5 mg Nebulization Q4H PRN Vianne Bulls, MD      . amoxicillin-clavulanate (AUGMENTIN) 250-125 MG per tablet 1 tablet  1 tablet Oral Q8H Barton Dubois, MD   1 tablet at 10/08/15 [phone]  . aspirin EC tablet 81 mg  81 mg Oral Daily Vianne Bulls, MD   81 mg at 10/08/15 1016  . atorvastatin (LIPITOR) tablet 10 mg  10 mg Oral q1800 Vianne Bulls, MD   10 mg at 10/07/15 1843  . bisacodyl (DULCOLAX) EC tablet 5 mg  5 mg Oral Daily PRN Vianne Bulls, MD      . donepezil (ARICEPT) tablet 10 mg  10 mg Oral QHS Vianne Bulls, MD   10 mg at 10/07/15 2152  . feeding supplement (ENSURE ENLIVE) (ENSURE ENLIVE) liquid 237 mL  237 mL Oral TID BM Barton Dubois, MD   237 mL at 10/08/15 1016  . HYDROcodone-acetaminophen (NORCO/VICODIN) 5-325 MG per tablet 1-2 tablet  1-2 tablet Oral Q4H PRN Ilene Qua Opyd, MD      . latanoprost (XALATAN) 0.005 % ophthalmic solution 1 drop  1 drop Both Eyes QHS Vianne Bulls, MD   1 drop at 10/07/15 2152  . ondansetron (ZOFRAN) tablet 4 mg  4 mg Oral Q6H PRN Vianne Bulls, MD       Or  . ondansetron (ZOFRAN) injection 4 mg  4 mg Intravenous Q6H PRN Timothy S Opyd, MD      . polyethylene glycol (MIRALAX / GLYCOLAX) packet 17 g  17 g Oral Daily PRN Vianne Bulls, MD      . rivaroxaban (XARELTO) tablet 20 mg  20 mg Oral Q supper Vianne Bulls, MD   20 mg at 10/07/15 1843  . senna (SENOKOT) tablet 8.6 mg  1 tablet Oral q morning - 10a Ilene Qua Opyd, MD   8.6 mg at 10/08/15 1016  . sertraline (ZOLOFT) tablet 25 mg  25 mg Oral Daily Barton Dubois, MD   25 mg at 10/08/15 1016  . sodium chloride flush (NS) 0.9 % injection 3 mL  3 mL  Intravenous Q12H Ilene Qua Opyd, MD   3 mL at 10/08/15 1016  . tamsulosin (FLOMAX) capsule 0.4 mg  0.4 mg Oral Daily Vianne Bulls, MD   0.4 mg at 10/08/15 1016     Discharge Medications: Please see discharge summary for a list of discharge medications.  Relevant Imaging Results:  Relevant Lab Results:   Additional Information    Blanch Media  S Verdie Barrows, LCSW

## 2015-10-08 NOTE — Discharge Instructions (Signed)
Information on my medicine - XARELTO (rivaroxaban)  WHY WAS XARELTO PRESCRIBED FOR YOU? Xarelto was prescribed to treat blood clots that may have been found in the veins of your legs (deep vein thrombosis) or in your lungs (pulmonary embolism) and to reduce the risk of them occurring again.  What do you need to know about Xarelto? Take one 20 mg tablet taken ONCE A DAY with your evening meal.  DO NOT stop taking Xarelto without talking to the health care provider who prescribed the medication.  Refill your prescription for 20 mg tablets before you run out.  After discharge, you should have regular check-up appointments with your healthcare provider that is prescribing your Xarelto.  In the future your dose may need to be changed if your kidney function changes by a significant amount.  What do you do if you miss a dose? If you are taking Xarelto TWICE DAILY and you miss a dose, take it as soon as you remember. You may take two 15 mg tablets (total 30 mg) at the same time then resume your regularly scheduled 15 mg twice daily the next day.  If you are taking Xarelto ONCE DAILY and you miss a dose, take it as soon as you remember on the same day then continue your regularly scheduled once daily regimen the next day. Do not take two doses of Xarelto at the same time.   Important Safety Information Xarelto is a blood thinner medicine that can cause bleeding. You should call your healthcare provider right away if you experience any of the following: ? Bleeding from an injury or your nose that does not stop. ? Unusual colored urine (red or dark brown) or unusual colored stools (red or black). ? Unusual bruising for unknown reasons. ? A serious fall or if you hit your head (even if there is no bleeding).  Some medicines may interact with Xarelto and might increase your risk of bleeding while on Xarelto. To help avoid this, consult your healthcare provider or pharmacist prior to using any  new prescription or non-prescription medications, including herbals, vitamins, non-steroidal anti-inflammatory drugs (NSAIDs) and supplements.  This website has more information on Xarelto: [email or web address]

## 2015-10-08 NOTE — Progress Notes (Signed)
[facility] PROGRESS NOTE  Grant Lara ZOX:[id number] DOB: 04/11/1941 DOA: 10/05/2015 PCP: [name], MD Interim summary and HPI 75 y.o. male with medical history significant for hypertension, coronary artery disease with stents, chronic systolic CHF, history of embolic stroke, history of pulmonary embolism, COPD, and dementia who presents the emergency department from his SNF for evaluation of the near syncopal episode just prior to arrival. Patient was recently admitted to this institution from 09/29/2015 to 10/04/2015, initially presenting with presyncope, but diagnosed with multifocal pneumonia and treated with broad-spectrum antibiotics, ultimately being discharged back to the SNF in much improved and stable condition. Two weeks prior to that admission, the patient was hospitalized for anemia with hemoglobin of 7.0, requiring transfusion 2 units packed red blood cells, and with workup negative for GI blood loss. He had reportedly been in his usual state back to the SNF today until becoming extremely lightheaded while standing. He was helped down to a seated position without actual loss of consciousness or fall. Patient reports a momentary loss in vision during the episode but denies associated chest pain, palpitations, or headache. He has an ICD which was recently interrogated and no problems identified. Patient is currently under treatment with Levaquin for his resolving HCAP. He denies fevers or chills, dyspnea, or cough.  Assessment/Plan: 1. Near syncope  -Pt reports lightheadedness and near-syncope upon standing for ~1 month and was just discharged after presenting with the same  -Suspect a neurally-mediated reflex syncope along with severe deconditioning  -echo w/o abnormalities to explain symptoms -ICD has not fire -will discontinue telemetry -Lab work performed on 10/08/2015 showing improved renal function, with creatinine trending down to 1.2 -Will benefit of  palliative care to follow him at SNF  2. HCAP/aspiration PNA -Diagnosed at time of admission on 6/16 and treated with broad-spectrums before returning to SNF with Levaquin  -WBC is 17.0 on admission, up from 16.1 at time of recent discharge, but no fever or hypoxia  -most likely demargination and dehydration -abx's switch to augmentin to cover better for aspiration concerns  -On 10/08/2015 he remains afebrile with white count stable at 13,700, plan to complete course of antibiotic therapy with Augmentin  3. Chronic combined systolic/diastolic CHF  -Appears dehydrated at time of admission  -overall compensated and with echo demonstrating slight improvement in EF; see below   -Not on diuretic or beta-blocker  -Follow daily wts and I/O's   4. CAD -No anginal complaints; no change in EKG; troponin wnl  -continue ASA and Lipitor   5. Microcytic anemia  -Hgb 7.4; MCV 72.5 on admission  -given hx of CAD and chronic anticoagulation received 2 units of PRBC's -Hgb 9.8 after transfusion -Repeat labs on 10/08/2015 showing stable hemoglobin of 9.9  6. Dementia -At baseline -will continue aricept  7. Depression  -Appears to be stable on admission  -continue zoloft, but dose adjusted to decrease risk for orthostatic changes   8. Hx of PE  -Continue Xarelto -no overt bleeding appreciated    9. CKD stage III  -SCr 1.34 on admission, up from apparent baseline of ~1.1  -received gentle hydration and 2 units of PRBC's -On 10/08/2015 labs show an improvement to kidney function with creatinine of 1.2.  10. COPD  -Stable with no hypoxia or increase in dyspnea or cough  -Continue prn Xopenex   11. Protein-calorie malnutrition, Severe -Pt appears cachectic with bitemporal wasting, BMI 17.5, albumin 2.3  -Continue feeding supplements and will follow up recommendations from nutritional service   Code  Status: Full Family Communication: no family at bedside Disposition  Plan: back to SNF most likely tomorrow or Monday (base on facility availability to received patient over the weekend)   Consultants:  None   Procedures:  2-D echo - Left ventricle: The cavity size was normal. Systolic function was  moderately to severely reduced. The estimated ejection fraction  was in the range of 30% to 35%. Diffuse hypokinesis. Doppler  parameters are consistent with abnormal left ventricular  relaxation (grade 1 diastolic dysfunction). There was no evidence  of elevated ventricular filling pressure by Doppler parameters. - Aortic valve: There was mild regurgitation. - Aortic root: The aortic root was normal in size. - Mitral valve: Structurally normal valve. There was mild  regurgitation. - Left atrium: The atrium was mildly dilated. - Right ventricle: Pacer wire or catheter noted in right ventricle.  Systolic function was normal. - Right atrium: The atrium was moderately dilated. Pacer wire or  catheter noted in right atrium. - Tricuspid valve: There was mild regurgitation. - Pulmonary arteries: Systolic pressure was mildly increased. PA  peak pressure: 39 mm Hg (S). - Inferior vena cava: The vessel was dilated. The respirophasic  diameter changes were blunted (< 50%), consistent with elevated  central venous pressure. - Pericardium, extracardiac: There was no pericardial effusion.  Impressions: - When compared to the prior study from 05/31/14 the LVEF has  improved, now 30-35% with diffuse hypokinesis.  Antibiotics:  Augmentin   HPI/Subjective: He is sitting up, no acute distress, tolerating by mouth intake   Objective: Filed Vitals:   10/07/15 2206 10/08/15 0518  BP: 123/66 111/64  Pulse: 62 69  Temp: 98.1 F (36.7 C) 98.3 F (36.8 C)  Resp: 16 16    Intake/Output Summary (Last 24 hours) at 10/08/15 1133 Last data filed at 10/08/15 0517  Gross per 24 hour  Intake    320 ml  Output    750 ml  Net   -430 ml   Filed  Weights   10/06/15 0522 10/07/15 0619 10/08/15 0500  Weight: 53.7 kg (118 lb 6.2 oz) 52.118 kg (114 lb 14.4 oz) 52.708 kg (116 lb 3.2 oz)    Exam:   General: awake and oriented X2; cachetic and chronically ill in appearance. Denies CP and SOB. Patient reports feeling slightly lightheaded this morning, has not been out bed today yet.  Cardiovascular: S1 and S2, no rubs, no gallops, regular rate  Respiratory: scattered rhonchi, no wheezing   Abdomen: soft, NT, ND, positive BS  Musculoskeletal: no edema or cyanosis   Data Reviewed: Basic Metabolic Panel:  Recent Labs Lab 10/02/15 0634 10/03/15 1538 10/05/15 2257 10/06/15 0519 10/08/15 0446  NA 137 135 137 133* 136  K 4.3 4.0 5.1 4.3 4.6  CL 104 103 104 102 101  CO2 26 25 26 25 29   GLUCOSE 114* 207* 141* 234* 113*  BUN 18 21* 17 17 23*  CREATININE 1.10 1.21 1.34* 1.33* 1.20  CALCIUM 8.8* 8.7* 9.0 8.5* 9.4   Liver Function Tests:  Recent Labs Lab 10/05/15 2257 10/06/15 0519  AST 155* 122*  ALT 263* 221*  ALKPHOS 170* 152*  BILITOT 0.3 0.2*  PROT 7.0 6.1*  ALBUMIN 2.3* 2.1*   CBC:  Recent Labs Lab 10/02/15 0634 10/05/15 2257 10/07/15 0543 10/08/15 0446  WBC 16.1* 17.0* 13.3* 13.7*  NEUTROABS  --  14.4*  --   --   HGB 8.2* 7.4* 9.8* 9.9*  HCT 29.2* 26.3* 31.7* 33.3*  MCV 72.6* 72.5* 73.4*  74.0*  PLT 371 363 362 375   BNP (last 3 results)  Recent Labs  10/31/14 0140 09/29/15 1210 10/05/15 2257  BNP 42.0 88.7 77.1   CBG:  Recent Labs Lab 10/04/15 1229 10/05/15 2121 12-Oct-2015 0519 10/07/15 0650 10/08/15 0514  GLUCAP 184* 171* 246* 143* 158*    Recent Results (from the past 240 hour(s))  Culture, blood (Routine X 2) w Reflex to ID Panel     Status: None   Collection Time: 10/01/15  6:42 PM  Result Value Ref Range Status   Specimen Description BLOOD RIGHT ARM  Final   Special Requests BOTTLES DRAWN AEROBIC ONLY 5CC  Final   Culture NO GROWTH 5 DAYS  Final   Report Status 12-Oct-2015 FINAL   Final  Culture, blood (Routine X 2) w Reflex to ID Panel     Status: None   Collection Time: 10/01/15  6:50 PM  Result Value Ref Range Status   Specimen Description BLOOD RIGHT HAND  Final   Special Requests IN PEDIATRIC BOTTLE [facility]  Final   Culture NO GROWTH 5 DAYS  Final   Report Status 12-Oct-2015 FINAL  Final  Urine culture     Status: Abnormal   Collection Time: 12-Oct-2015 12:04 PM  Result Value Ref Range Status   Specimen Description URINE, CATHETERIZED  Final   Special Requests NONE  Final   Culture >=100,000 COLONIES/mL YEAST (A)  Final   Report Status 10/07/2015 FINAL  Final     Studies: US Abdomen Limited Ruq  10/12/15  CLINICAL DATA:  Elevated LFTs. EXAM: US ABDOMEN LIMITED - RIGHT UPPER QUADRANT COMPARISON:  05/02/2014. FINDINGS: Gallbladder: Small 9 mm gallstone noted. Gallbladder wall thickness 1.2 mm. No pericholecystic fluid collection. Negative Murphy sign. Common bile duct: Diameter: 4.7 mm Liver: 9 mm simple cyst is noted in the right lobe of the liver. Liver is slightly echogenic suggesting fatty infiltration. IMPRESSION: 1.  Small gallstone.  No biliary distention. 2. Liver is slightly echogenic suggesting fatty infiltration . Electronically Signed   By: Marcello Moores  Register   On: 2015/10/12 11:39    Scheduled Meds: . amoxicillin-clavulanate  1 tablet Oral Q8H  . aspirin EC  81 mg Oral Daily  . atorvastatin  10 mg Oral q1800  . donepezil  10 mg Oral QHS  . feeding supplement (ENSURE ENLIVE)  237 mL Oral TID BM  . latanoprost  1 drop Both Eyes QHS  . rivaroxaban  20 mg Oral Q supper  . senna  1 tablet Oral q morning - 10a  . sertraline  25 mg Oral Daily  . sodium chloride flush  3 mL Intravenous Q12H  . tamsulosin  0.4 mg Oral Daily   Continuous Infusions:   Principal Problem:   Near syncope Active Problems:   Chronic systolic heart failure (HCC)   History of embolic stroke   DM2 (diabetes mellitus, type 2) (HCC)   Hx pulmonary embolism   Leukocytosis   CKD  (chronic kidney disease) stage 3, GFR 30-59 ml/min   Coronary artery disease involving native coronary artery of native heart without angina pectoris   Depression due to dementia   Protein-calorie malnutrition, moderate (HCC)   Microcytic anemia   Absolute anemia    Time spent: 20 minutes    Kelvin Cellar  [facility] Pager [phone]. If 7PM-7AM, please contact night-coverage at [email or web address], password [facility] 10/08/2015, 11:33 AM

## 2015-10-09 DIAGNOSIS — I5022 Chronic systolic (congestive) heart failure: Secondary | ICD-10-CM | POA: Diagnosis not present

## 2015-10-09 DIAGNOSIS — E440 Moderate protein-calorie malnutrition: Secondary | ICD-10-CM | POA: Diagnosis not present

## 2015-10-09 DIAGNOSIS — Z86711 Personal history of pulmonary embolism: Secondary | ICD-10-CM | POA: Diagnosis not present

## 2015-10-09 DIAGNOSIS — J189 Pneumonia, unspecified organism: Secondary | ICD-10-CM

## 2015-10-09 DIAGNOSIS — R55 Syncope and collapse: Secondary | ICD-10-CM | POA: Diagnosis not present

## 2015-10-09 LAB — GLUCOSE, CAPILLARY: GLUCOSE-CAPILLARY: 141 mg/dL — AB (ref 65–99)

## 2015-10-09 MED ORDER — ALPRAZOLAM 0.5 MG PO TABS: 0.5000 mg | ORAL_TABLET | Freq: Every evening | ORAL | Status: DC | PRN

## 2015-10-09 MED ORDER — AMOXICILLIN-POT CLAVULANATE 250-125 MG PO TABS: 1.0000 | ORAL_TABLET | Freq: Two times a day (BID) | ORAL | Status: DC

## 2015-10-09 NOTE — Discharge Summary (Signed)
Physician Discharge Summary  Grant Lara WNU:[id number] DOB: 02-21-1941 DOA: 10/05/2015  PCP: [name], MD  Admit date: 10/05/2015 Discharge date: 10/09/2015  Time spent: 35 minutes  Recommendations for Outpatient Follow-up:  1. He was covered with antibiotics for possible aspiration pneumonia versus HCAP, recommend 4 more days of Augmentin.  2. Please repeat CBC and BMP in 5-7 days. On 6 06/18/2015 he had a creatinine of 1.2 with BUN of 23. His white count was 13.7.    Discharge Diagnoses:  Principal Problem:   Near syncope Active Problems:   Chronic systolic heart failure (HCC)   History of embolic stroke   DM2 (diabetes mellitus, type 2) (HCC)   Hx pulmonary embolism   Leukocytosis   CKD (chronic kidney disease) stage 3, GFR 30-59 ml/min   Coronary artery disease involving native coronary artery of native heart without angina pectoris   Depression due to dementia   Protein-calorie malnutrition, moderate (HCC)   Microcytic anemia   Absolute anemia   Discharge Condition: Stable  Diet recommendation: Heart healthy diet  Filed Weights   10/07/15 0619 10/08/15 0500 10/09/15 0419  Weight: 52.118 kg (114 lb 14.4 oz) 52.708 kg (116 lb 3.2 oz) 53.6 kg (118 lb 2.7 oz)    History of present illness:  Grant Lara is a 75 y.o. male with medical history significant for hypertension, coronary artery disease with stents, chronic systolic CHF, history of embolic stroke, history of pulmonary embolism, COPD, and dementia who presents the emergency department from his SNF for evaluation of the near syncopal episode just prior to arrival. Patient was recently admitted to this institution from 09/29/2015 to 10/04/2015, initially presenting with presyncope, but diagnosed with multifocal pneumonia and treated with broad-spectrum antibiotics, ultimately being discharged back to the SNF in much improved and stable condition. Two weeks prior to that admission, the patient was hospitalized  for anemia with hemoglobin of 7.0, requiring transfusion 2 units packed red blood cells, and with workup negative for GI blood loss. He had reportedly been in his usual state back to the SNF today until becoming extremely lightheaded while standing. He was helped down to a seated position without actual loss of consciousness or fall. Patient reports a momentary loss in vision during the episode but denies associated chest pain, palpitations, or headache. He has an ICD which was recently interrogated and no problems identified. Patient is currently under treatment with Levaquin for his resolving HCAP. He denies fevers or chills, dyspnea, or cough.  Hospital Course:  1. Near syncope  -Pt reports lightheadedness and near-syncope upon standing for ~1 month and was just discharged after presenting with the same  -Suspect a neurally-mediated reflex syncope along with severe deconditioning  -echo w/o abnormalities to explain symptoms -ICD has not fire -Lab work performed on 10/08/2015 showing improved renal function, with creatinine trending down to 1.2 -By 10/09/2015 he seemed to be doing much better, did not report any further episodes of near syncope. He was tolerating by mouth intake, remained afebrile, appeared to be functioning at his baseline. -On 10/09/2015 he was discharged back to his skilled nursing facility  2. HCAP/aspiration PNA -White count coming down to 13,700 from 17,000 -Showing clinical improvement -He was discharged on Augmentin  3. Chronic combined systolic/diastolic CHF  -Appeared dehydrated at time of admission  -overall compensated and with echo demonstrating slight improvement in EF -Not on diuretic or beta-blocker  -Continue to follow volume status  4. CAD -No anginal complaints; no change in EKG; troponin wnl  -  continue ASA and Lipitor   5. Microcytic anemia  -Hgb 7.4; MCV 72.5 on admission  -given hx of CAD and chronic anticoagulation received 2 units of  PRBC's -Hgb 9.8 after transfusion -Repeat labs on 10/08/2015 showing stable hemoglobin of 9.9  6. Dementia -At baseline -will continue aricept  7. Depression  -Appears to be stable on admission  -continue zoloft, but dose adjusted to decrease risk for orthostatic changes   8. Hx of PE  -Continue Xarelto -no overt bleeding appreciated    9. CKD stage III  -SCr 1.34 on admission, up from apparent baseline of ~1.1  -received gentle hydration and 2 units of PRBC's -On 10/08/2015 labs show an improvement to kidney function with creatinine of 1.2.  10. Protein-calorie malnutrition, Severe -Pt appears cachectic with bitemporal wasting, BMI 17.5, albumin 2.3  -Continue feeding supplements    Discharge Exam: Filed Vitals:   10/09/15 0618 10/09/15 1125  BP: 114/63 114/70  Pulse: 69 74  Temp: 97.9 F (36.6 C) 97.5 F (36.4 C)  Resp: 16 18     General: awake and oriented X2; cachetic and chronically ill in appearance.   Cardiovascular: S1 and S2, no rubs, no gallops, regular rate  Respiratory: scattered rhonchi, no wheezing   Abdomen: soft, NT, ND, positive BS  Musculoskeletal: no edema or cyanosis  Discharge Instructions   Discharge Instructions    Call MD for:  difficulty breathing, headache or visual disturbances    Complete by:  As directed      Call MD for:  extreme fatigue    Complete by:  As directed      Call MD for:  hives    Complete by:  As directed      Call MD for:  persistant dizziness or light-headedness    Complete by:  As directed      Call MD for:  persistant nausea and vomiting    Complete by:  As directed      Call MD for:  redness, tenderness, or signs of infection (pain, swelling, redness, odor or green/yellow discharge around incision site)    Complete by:  As directed      Call MD for:  severe uncontrolled pain    Complete by:  As directed      Call MD for:  temperature >100.4    Complete by:  As directed      Call MD for:     Complete by:  As directed      Diet - low sodium heart healthy    Complete by:  As directed      Increase activity slowly    Complete by:  As directed           Current Discharge Medication List    START taking these medications   Details  amoxicillin-clavulanate (AUGMENTIN) 250-125 MG tablet Take 1 tablet by mouth 2 (two) times daily. Qty: 8 tablet, Refills: 0      CONTINUE these medications which have CHANGED   Details  ALPRAZolam (XANAX) 0.5 MG tablet Take 1 tablet (0.5 mg total) by mouth at bedtime as needed. Qty: 15 tablet, Refills: 0      CONTINUE these medications which have NOT CHANGED   Details  acetaminophen (TYLENOL) 500 MG tablet Take 2 tablets (1,000 mg total) by mouth 2 (two) times daily. Qty: 90 tablet, Refills: prn   Associated Diagnoses: Bilateral lower extremity pain    aspirin 81 MG tablet Take 81 mg by mouth daily.  atorvastatin (LIPITOR) 10 MG tablet Take 1 tablet (10 mg total) by mouth daily at 6 PM. Qty: 30 tablet, Refills: 0   Associated Diagnoses: Hyperlipidemia    donepezil (ARICEPT) 10 MG tablet Take 10 mg by mouth at bedtime.     feeding supplement, ENSURE ENLIVE, (ENSURE ENLIVE) LIQD Take 237 mLs by mouth 2 (two) times daily between meals. Qty: 237 mL, Refills: 12    latanoprost (XALATAN) 0.005 % ophthalmic solution Place 1 drop into both eyes at bedtime.    levalbuterol (XOPENEX) 0.63 MG/3ML nebulizer solution Take 3 mLs (0.63 mg total) by nebulization every 6 (six) hours as needed for wheezing or shortness of breath. Qty: 3 mL, Refills: 12    rivaroxaban (XARELTO) 20 MG TABS tablet Take 1 tablet (20 mg total) by mouth daily with supper. Qty: 30 tablet    senna (SENOKOT) 8.6 MG TABS tablet Take 1 tablet by mouth every morning.    sertraline (ZOLOFT) 50 MG tablet Take 50 mg by mouth every morning.     tamsulosin (FLOMAX) 0.4 MG CAPS capsule Take 1 capsule (0.4 mg total) by mouth daily. Qty: 30 capsule, Refills: 0      STOP taking  these medications     levofloxacin (LEVAQUIN) 750 MG tablet        No Known Allergies Follow-up Information    Follow up with [facility] .   Specialty:  [place] information:   [address] [place] [phone]       The results of significant diagnostics from this hospitalization (including imaging, microbiology, ancillary and laboratory) are listed below for reference.    Significant Diagnostic Studies: Dg Chest 2 View  10/05/2015  CLINICAL DATA:  75 year old male with near syncope EXAM: CHEST  2 VIEW COMPARISON:  Chest radiograph dated 09/30/2015 and chest CT dated 09/29/2015 FINDINGS: Two views of the chest demonstrate an area of increased density in the retrocardiac region, likely atelectasis/ scarring versus infiltrate. There is mild blunting of the left costophrenic angle which may represent trace pleural effusion. No pneumothorax. The cardiac silhouette is within normal limits. Left pectoral AICD device. There is degenerative changes of the spine. No acute osseous pathology. IMPRESSION: Retrocardiac density may represent atelectasis versus infiltrate. Electronically Signed   By: Anner Crete M.D.   On: 10/05/2015 22:58   Dg Chest 2 View  09/29/2015  CLINICAL DATA:  Hypoxia EXAM: CHEST  2 VIEW COMPARISON:  09/08/2015 FINDINGS: Cardiac shadow is stable. Defibrillator is again noted. Lungs are well aerated bilaterally. No focal infiltrate or effusion is noted. No bony abnormality is seen. IMPRESSION: No active cardiopulmonary disease. Electronically Signed   By: Inez Catalina M.D.   On: 09/29/2015 13:49   Ct Head Wo Contrast  09/29/2015  CLINICAL DATA:  Possible seizure today. The patient was unresponsive for 30 seconds. Initial encounter. EXAM: CT HEAD WITHOUT CONTRAST TECHNIQUE: Contiguous axial images were obtained from the base of the skull through the vertex without intravenous contrast.  COMPARISON:  Head CT scan 09/08/2015 and 12/21/2014. FINDINGS: Atrophy and extensive chronic microvascular ischemic change are again seen. Bilateral cerebellar infarcts, most conspicuous in the left cerebellar hemisphere and bilateral subcortical infarcts best seen in the right caudate and thalamus are again identified. Anterior and posterior watershed territory infarcts bilaterally are also again identified. No evidence of acute abnormality including hemorrhage, infarct, mass lesion, mass effect, midline shift or abnormal extra-axial fluid collection. No hydrocephalus or pneumocephalus.  The calvarium is intact. Imaged paranasal sinuses mastoid air cells are clear. IMPRESSION: No acute abnormality. Extensive atrophy and multiple remote infarctions as seen on prior exams. Electronically Signed   By: Inge Rise M.D.   On: 09/29/2015 13:14   Ct Chest Wo Contrast  09/29/2015  CLINICAL DATA:  75 year old male with a cough for the past 2 days. History of CHF. EXAM: CT CHEST WITHOUT CONTRAST TECHNIQUE: Multidetector CT imaging of the chest was performed following the standard protocol without IV contrast. COMPARISON:  Chest x-ray obtained earlier today ; prior chest CT 04/18/2014 FINDINGS: Mediastinum: Unremarkable CT appearance of the thyroid gland. No suspicious mediastinal or hilar adenopathy. No soft tissue mediastinal mass. The thoracic esophagus is unremarkable. Heart/Vascular: Limited evaluation in the absence of intravenous contrast. Left subclavian approach cardiac rhythm maintenance device with leads terminating in the right atrium and right ventricular apex. The heart is normal in size. No pericardial effusion. Atherosclerotic calcifications present throughout the coronary arteries. No pericardial effusion. Lungs/Pleura: Stable 2 cm thin walled cystic structure with adjacent pleural parenchymal scarring in the right upper lobe. Mild background centrilobular emphysema. Interval development of patchy  ground-glass attenuation airspace opacities in a peribronchovascular distribution in the right greater than left lower lobes. Findings superimposed on a background of chronic scarring. Bones/Soft Tissues: No acute fracture or aggressive appearing lytic or blastic osseous lesion. Upper Abdomen: Multiple hypoechoic lesions throughout both kidneys which are incompletely characterized in the absence of intravenous contrast material. However, prior imaging demonstrated multiple simple cysts. No nephrolithiasis visualized. Upper abdominal organs are unremarkable. IMPRESSION: 1. Findings most consistent with an infectious/ inflammatory process involving the bilateral lower lobes. Given the distribution, aspiration is a consideration as is multilobar pneumonia. 2. Coronary artery calcifications. 3. Bilateral renal cysts. Electronically Signed   By: Jacqulynn Cadet M.D.   On: 09/29/2015 16:00   Portable Chest 1 View  09/30/2015  CLINICAL DATA:  Health care associated pneumonia. EXAM: PORTABLE CHEST 1 VIEW COMPARISON:  Radiograph of September 29, 2015. FINDINGS: The heart size and mediastinal contours are within normal limits. Both lungs are clear. No pneumothorax or pleural effusion is noted. Left-sided pacemaker is unchanged in position. The visualized skeletal structures are unremarkable. IMPRESSION: No acute cardiopulmonary abnormality seen. Electronically Signed   By: Marijo Conception, M.D.   On: 09/30/2015 10:08   Dg Swallowing Func-speech Pathology  09/30/2015  Objective Swallowing Evaluation: Type of Study: MBS-Modified Barium Swallow Study Patient Details Name: OBIE KALLENBACH MRN: [id number] Date of Birth: 11/08/1940 Today's Date: 09/30/2015 Time: SLP Start Time (ACUTE ONLY): 1100-SLP Stop Time (ACUTE ONLY): 1120 SLP Time Calculation (min) (ACUTE ONLY): 20 min Past Medical History: Past Medical History Diagnosis Date . Hypertension  . Dilated cardiomyopathy ([place])    2/12: EF 30-35%, trivial AI, mild RAE.  EF 2014 40 -45%  . CAD (coronary artery disease)    LHC 9/05 with Dr. Einar Gip:  dLM 20-30%, LAD 85%, oD1 20-30%.  PCI:  Taxus DES to LAD; Dx jailed and tx with POBA.  Last myoview 12/10: inf scar, no ischemia, EF 29%. . DVT (deep venous thrombosis) ([place])  . Pulmonary embolus (HCC)    chronic coumadin . 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 PFO (patent foramen ovale)    Not mentioned on 2014 echo. [name] HLD (hyperlipidemia)  . Crohn's disease ([place])  . Nephrolithiasis  . Diabetes mellitus    11/05/11 "borderline; don't take medications" . Stroke [facility]) 1993   "left arm can't hold steady; leg too" . Arthritis    "  used to have a touch in my legs" . Memory difficulties  . Swelling of both ankles     09-22-13 occ.feet, but denies pain. . Bradycardia 06/01/2014 . Dementia  . Hyperlipidemia  . CHF (congestive heart failure) ([place])  . Pneumonia ~ 2011   09-22-13 denies any recent SOB or breathing problems . HCAP (healthcare-associated pneumonia) 09/29/2015 . AICD (automatic cardioverter/defibrillator) present  Past Surgical History: Past Surgical History Procedure Laterality Date . Colon surgery  1994; 1996   "for Crohn's disease" . Appendectomy   . Implantable cardioverter defibrillator implant N/A 06/02/2014   Procedure: IMPLANTABLE CARDIOVERTER DEFIBRILLATOR IMPLANT;  Surgeon: [name], MD;  Location[facility] C[facility];  Service: Cardiovascular;  Laterality: N/A; HPI: Majour Frei Doucet is a 75 y.o. male who was recently discharged presents via [facility] from [facility] for possible syncopal episode vs seizure. Pt was at PT when he had a 30 second episode of "unresponsiveness" with a right sided gaze. Pt was sitting, no fall or head injury. After episode pt had generalized bilateral weakness. No history of seizure known. Pt responsive to verbal stimuli. GCS reported 13. Follows commands. EMS reported rhonchi in lower bases with nonproductive cough. Oxygen sat in 80s RA on arrival. He has a PMH of dementia, hypertension, hyperlipidemia, diabetes  mellitus, depression, CAD, DVT, PE on Xarelto, PFO, chronic disease, stroke, BPH, systolic congestive heart failure (EF 20-25%), who presents with generalized weakness and increased cough, chest congestion and shortness of breath.The patient was evaluated in the ED and found to have an acute leukocytosis, hypoxia and abnormal lung sounds. A CT of the chest was obtained that was significant for multifocal bilateral pneumonia. No Data Recorded Assessment / Plan / Recommendation CHL IP CLINICAL IMPRESSIONS 09/30/2015 Therapy Diagnosis Mild pharyngeal phase dysphagia Clinical Impression Pt demonstrates mild oropharyngeal dysphagia characterized by delayed swallow initiation with consistent flash penetration of liquids. Laryngeal closure is adequate and airway remained protected throughout assessment despite large consecutive swallows. Recommend pt consume a regular diet and thin liquids. No SLP f/u needed, will sign off.  Impact on safety and function Mild aspiration risk   CHL IP TREATMENT RECOMMENDATION 09/30/2015 Treatment Recommendations No treatment recommended at this time   No flowsheet data found. CHL IP DIET RECOMMENDATION 09/30/2015 SLP Diet Recommendations Regular solids;Thin liquid Liquid Administration via Cup;Straw Medication Administration Whole meds with liquid Compensations Slow rate;Small sips/bites Postural Changes Seated upright at 90 degrees   CHL IP OTHER RECOMMENDATIONS 09/30/2015 Recommended Consults -- Oral Care Recommendations Oral care BID Other Recommendations --   CHL IP FOLLOW UP RECOMMENDATIONS 09/30/2015 Follow up Recommendations None   CHL IP FREQUENCY AND DURATION 04/18/2014 Speech Therapy Frequency (ACUTE ONLY) min 2x/week Treatment Duration --      CHL IP ORAL PHASE 09/30/2015 Oral Phase WFL Oral - Pudding Teaspoon -- Oral - Pudding Cup -- Oral - Honey Teaspoon -- Oral - Honey Cup -- Oral - Nectar Teaspoon -- Oral - Nectar Cup -- Oral - Nectar Straw -- Oral - Thin Teaspoon -- Oral - Thin  Cup -- Oral - Thin Straw -- Oral - Puree -- Oral - Mech Soft -- Oral - Regular -- Oral - Multi-Consistency -- Oral - Pill -- Oral Phase - Comment --  CHL IP PHARYNGEAL PHASE 09/30/2015 Pharyngeal Phase Impaired Pharyngeal- Pudding Teaspoon -- Pharyngeal -- Pharyngeal- Pudding Cup -- Pharyngeal -- Pharyngeal- Honey Teaspoon -- Pharyngeal -- Pharyngeal- Honey Cup -- Pharyngeal -- Pharyngeal- Nectar Teaspoon -- Pharyngeal -- Pharyngeal- Nectar Cup -- Pharyngeal -- Pharyngeal-  Nectar Straw -- Pharyngeal -- Pharyngeal- Thin Teaspoon -- Pharyngeal -- Pharyngeal- Thin Cup Delayed swallow initiation-pyriform sinuses;Penetration/Aspiration before swallow;Penetration/Aspiration during swallow Pharyngeal Material enters airway, remains ABOVE vocal cords then ejected out Pharyngeal- Thin Straw Delayed swallow initiation-pyriform sinuses;Penetration/Aspiration before swallow;Penetration/Aspiration during swallow Pharyngeal Material enters airway, remains ABOVE vocal cords then ejected out Pharyngeal- Puree Delayed swallow initiation-pyriform sinuses Pharyngeal Material does not enter airway Pharyngeal- Mechanical Soft -- Pharyngeal -- Pharyngeal- Regular Delayed swallow initiation-pyriform sinuses Pharyngeal -- Pharyngeal- Multi-consistency -- Pharyngeal -- Pharyngeal- Pill Delayed swallow initiation-pyriform sinuses;Penetration/Aspiration before swallow;Penetration/Aspiration during swallow Pharyngeal Material enters airway, remains ABOVE vocal cords then ejected out Pharyngeal Comment --  No flowsheet data found. No flowsheet data found. Herbie Baltimore, MA CCC-SLP [phone] Lynann Beaver 09/30/2015, 12:49 PM              US Abdomen Limited Ruq  10/06/2015  CLINICAL DATA:  Elevated LFTs. EXAM: US ABDOMEN LIMITED - RIGHT UPPER QUADRANT COMPARISON:  05/02/2014. FINDINGS: Gallbladder: Small 9 mm gallstone noted. Gallbladder wall thickness 1.2 mm. No pericholecystic fluid collection. Negative Murphy sign. Common bile  duct: Diameter: 4.7 mm Liver: 9 mm simple cyst is noted in the right lobe of the liver. Liver is slightly echogenic suggesting fatty infiltration. IMPRESSION: 1.  Small gallstone.  No biliary distention. 2. Liver is slightly echogenic suggesting fatty infiltration . Electronically Signed   By: Marcello Moores  Register   On: 10/06/2015 11:39    Microbiology: Recent Results (from the past 240 hour(s))  Culture, blood (Routine X 2) w Reflex to ID Panel     Status: None   Collection Time: 10/01/15  6:42 PM  Result Value Ref Range Status   Specimen Description BLOOD RIGHT ARM  Final   Special Requests BOTTLES DRAWN AEROBIC ONLY 5CC  Final   Culture NO GROWTH 5 DAYS  Final   Report Status 10/06/2015 FINAL  Final  Culture, blood (Routine X 2) w Reflex to ID Panel     Status: None   Collection Time: 10/01/15  6:50 PM  Result Value Ref Range Status   Specimen Description BLOOD RIGHT HAND  Final   Special Requests IN PEDIATRIC BOTTLE [facility]  Final   Culture NO GROWTH 5 DAYS  Final   Report Status 10/06/2015 FINAL  Final  Urine culture     Status: Abnormal   Collection Time: 10/06/15 12:04 PM  Result Value Ref Range Status   Specimen Description URINE, CATHETERIZED  Final   Special Requests NONE  Final   Culture >=100,000 COLONIES/mL YEAST (A)  Final   Report Status 10/07/2015 FINAL  Final     Labs: Basic Metabolic Panel:  Recent Labs Lab 10/03/15 1538 10/05/15 2257 10/06/15 0519 10/08/15 0446  NA 135 137 133* 136  K 4.0 5.1 4.3 4.6  CL 103 104 102 101  CO2 25 26 25 29   GLUCOSE 207* 141* 234* 113*  BUN 21* 17 17 23*  CREATININE 1.21 1.34* 1.33* 1.20  CALCIUM 8.7* 9.0 8.5* 9.4   Liver Function Tests:  Recent Labs Lab 10/05/15 2257 10/06/15 0519  AST 155* 122*  ALT 263* 221*  ALKPHOS 170* 152*  BILITOT 0.3 0.2*  PROT 7.0 6.1*  ALBUMIN 2.3* 2.1*   No results for input(s): LIPASE, AMYLASE in the last 168 hours. No results for input(s): AMMONIA in the last 168 hours. CBC:  Recent  Labs Lab 10/05/15 2257 10/07/15 0543 10/08/15 0446  WBC 17.0* 13.3* 13.7*  NEUTROABS 14.4*  --   --   HGB  7.4* 9.8* 9.9*  HCT 26.3* 31.7* 33.3*  MCV 72.5* 73.4* 74.0*  PLT 363 362 375   Cardiac Enzymes: No results for input(s): CKTOTAL, CKMB, CKMBINDEX, TROPONINI in the last 168 hours. BNP: BNP (last 3 results)  Recent Labs  10/31/14 0140 09/29/15 1210 10/05/15 2257  BNP 42.0 88.7 77.1    ProBNP (last 3 results) No results for input(s): PROBNP in the last 8760 hours.  CBG:  Recent Labs Lab 10/05/15 2121 10/06/15 0519 10/07/15 0650 10/08/15 0514 10/09/15 0618  GLUCAP 171* 246* 143* 158* 141*       Signed:  Kelvin Cellar MD.  [facility] 10/09/2015, 12:01 PM

## 2015-10-09 NOTE — Progress Notes (Signed)
Patient will discharge to [facility] SNF Anticipated discharge date: 6/26 Family notified: pt wife, Garment/textile technologist by [employer]- scheduled for 2pm  CSW signing off.  Domenica Reamer, [place] Social Worker [phone]

## 2015-10-09 NOTE — Progress Notes (Signed)
Balinda Quails Dooley to be D/C'd back to skilled nursing facility per MD order.  Discussed with the patient and all questions fully answered.  VSS, Skin clean, dry and intact without evidence of skin break down, no evidence of skin tears noted. IV catheter discontinued intact. Site without signs and symptoms of complications. Dressing and pressure applied.  An After Visit Summary was printed and given to PTAR.   Patient instructed to return to ED, call 911, or call MD for any changes in condition.   Patient escorted via stretcher, and D/C to [facility] via [place].  Morley Kos Price 10/09/2015 2:56 PM

## 2015-10-10 ENCOUNTER — Non-Acute Institutional Stay (SKILLED_NURSING_FACILITY): Payer: Medicare Other | Admitting: Adult Health

## 2015-10-10 ENCOUNTER — Encounter: Payer: Self-pay | Admitting: Adult Health

## 2015-10-10 DIAGNOSIS — I6990 Unspecified sequelae of unspecified cerebrovascular disease: Secondary | ICD-10-CM | POA: Diagnosis not present

## 2015-10-10 DIAGNOSIS — E1122 Type 2 diabetes mellitus with diabetic chronic kidney disease: Secondary | ICD-10-CM

## 2015-10-10 DIAGNOSIS — N183 Chronic kidney disease, stage 3 (moderate): Secondary | ICD-10-CM | POA: Diagnosis not present

## 2015-10-10 DIAGNOSIS — I2510 Atherosclerotic heart disease of native coronary artery without angina pectoris: Secondary | ICD-10-CM | POA: Diagnosis not present

## 2015-10-10 DIAGNOSIS — I110 Hypertensive heart disease with heart failure: Secondary | ICD-10-CM

## 2015-10-10 DIAGNOSIS — I5022 Chronic systolic (congestive) heart failure: Secondary | ICD-10-CM

## 2015-10-10 NOTE — Progress Notes (Signed)
Patient ID: Grant Lara, male   DOB: 11/11/1940, 75 y.o.   MRN: [id number]   Location:     [place] Room Number: 542-H Place of Service:  SNF (31)   CODE STATUS: Full Code  No Known Allergies  Chief Complaint  Patient presents with  . Hospitalization Follow-up    Hospital Follow up    HPI:  He has been hospitalized for pneumonia and near syncope.  He is presently on augmentin for his pneumonia. He is unable to fully participate in the hpi or ros. There are no nursing concerns at this time.    Past Medical History  Diagnosis Date  . Hypertension   . Dilated cardiomyopathy ([place])     2/12: EF 30-35%, trivial AI, mild RAE.  EF 2014 40 -45%  . CAD (coronary artery disease)     LHC 9/05 with Dr. Einar Gip:  dLM 20-30%, LAD 85%, oD1 20-30%.  PCI:  Taxus DES to LAD; Dx jailed and tx with POBA.  Last myoview 12/10: inf scar, no ischemia, EF 29%.  . DVT (deep venous thrombosis) ([place])   . Pulmonary embolus ([facility])     chronic coumadin  . 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 PFO (patent foramen ovale)     Not mentioned on 2014 echo.  [name] HLD (hyperlipidemia)   . Crohn's disease ([place])   . Nephrolithiasis   . Diabetes mellitus     11/05/11 "borderline; don't take medications"  . Stroke [facility]) 1993    "left arm can't hold steady; leg too"  . Arthritis     "used to have a touch in my legs"  . Memory difficulties   . Swelling of both ankles      09-22-13 occ.feet, but denies pain.  . Bradycardia 06/01/2014  . Dementia   . Hyperlipidemia   . CHF (congestive heart failure) ([place])   . Pneumonia ~ 2011    09-22-13 denies any recent SOB or breathing problems  . HCAP (healthcare-associated pneumonia) 09/29/2015  . AICD (automatic cardioverter/defibrillator) present     Past Surgical History  Procedure Laterality Date  . Colon surgery  1994; 1996    "for Crohn's disease"  . Appendectomy    . Implantable cardioverter defibrillator implant N/A 06/02/2014    Procedure: IMPLANTABLE  CARDIOVERTER DEFIBRILLATOR IMPLANT;  Surgeon: [name], MD;  Location: [facility] CATH LAB;  Service: Cardiovascular;  Laterality: N/A;    Social History   Social History  . Marital Status: Married    Spouse Name: N/A  . Number of Children: N/A  . Years of Education: N/A   Occupational History  . Not on file.   Social History Main Topics  . Smoking status: Current Some Day Smoker -- 0.50 packs/day for 53 years    Types: Cigarettes  . Smokeless tobacco: Never Used  . Alcohol Use: No  . Drug Use: No  . Sexual Activity: Not Currently   Other Topics Concern  . Not on file   Social History Narrative   Family History  Problem Relation Age of Onset  . Diabetes type II Mother   . CAD Father   . Diabetes type II Brother       VITAL SIGNS BP 138/86 mmHg  Pulse 70  Temp(Src) 97.6 F (36.4 C) (Oral)  Resp 18  Ht 5' 3"  (1.6 m)  Wt 123 lb (55.792 kg)  BMI 21.79 kg/m2  SpO2 92%  Patient's Medications  New Prescriptions   No medications on file  Previous Medications   ACETAMINOPHEN (TYLENOL) 500 MG TABLET    Take 1,000 mg by mouth daily.   ALPRAZOLAM (XANAX) 0.5 MG TABLET    Take 1 tablet (0.5 mg total) by mouth at bedtime as needed.   AMOXICILLIN-CLAVULANATE (AUGMENTIN) 250-125 MG TABLET    Take 1 tablet by mouth 2 (two) times daily.   ASPIRIN 81 MG TABLET    Take 81 mg by mouth daily.   ATORVASTATIN (LIPITOR) 10 MG TABLET    Take 1 tablet (10 mg total) by mouth daily at 6 PM.   DONEPEZIL (ARICEPT) 10 MG TABLET    Take 10 mg by mouth at bedtime.    FEEDING SUPPLEMENT, ENSURE ENLIVE, (ENSURE ENLIVE) LIQD    Take 237 mLs by mouth 2 (two) times daily between meals.   LATANOPROST (XALATAN) 0.005 % OPHTHALMIC SOLUTION    Place 1 drop into both eyes at bedtime.   LEVALBUTEROL (XOPENEX) 0.63 MG/3ML NEBULIZER SOLUTION    Take 3 mLs (0.63 mg total) by nebulization every 6 (six) hours as needed for wheezing or shortness of breath.   RIVAROXABAN (XARELTO) 20 MG TABS TABLET    Take 1  tablet (20 mg total) by mouth daily with supper.   SENNA (SENOKOT) 8.6 MG TABS TABLET    Take 1 tablet by mouth every morning.   SERTRALINE (ZOLOFT) 50 MG TABLET    Take 50 mg by mouth every morning.    TAMSULOSIN (FLOMAX) 0.4 MG CAPS CAPSULE    Take 1 capsule (0.4 mg total) by mouth daily.  Modified Medications   No medications on file  Discontinued Medications   ACETAMINOPHEN (TYLENOL) 500 MG TABLET    Take 2 tablets (1,000 mg total) by mouth 2 (two) times daily.     SIGNIFICANT DIAGNOSTIC EXAMS  09-08-15: KUB: No evidence of bowel obstruction. No acute fracture noted.  09-08-15: chest x-ray: No acute cardiopulmonary disease. COPD.  09-08-15: ct of head and cervical spine: 1. No evidence of acute intracranial or cervical spine injury. 2. Extensive chronic ischemic injury as described, stable from 12/21/2014 comparison.  09-09-15: EEG: Clinical Interpretation: This essentially normal EEG is recorded in the waking and sleep state. There was no seizure or seizure predisposition recorded on this study. Please note that a normal EEG does not preclude the possibility of epilepsy.  6-16 ct of head: No acute abnormality. Extensive atrophy and multiple remote infarctions as seen on prior exams  09-30-15: chest x-ray: No acute cardiopulmonary abnormality seen.  09-30-15: swallow study: Regular solids;Thin liquid   10-05-15: chest x-ray: Retrocardiac density may represent atelectasis versus infiltrate.  10-06-15: right upper quad ultrasound: 1.  Small gallstone.  No biliary distention. 2. Liver is slightly echogenic suggesting fatty infiltration .  10-06-15: 2-d echo:   - Left ventricle: The cavity size was normal. Systolic function was moderately to severely reduced. The estimated ejection fraction was in the range of 30% to 35%. Diffuse hypokinesis. Doppler parameters are consistent with abnormal left ventricular relaxation (grade 1 diastolic dysfunction). There was no evidence of elevated  ventricular filling pressure by Doppler parameters. - Aortic valve: There was mild regurgitation. - Aortic root: The aortic root was normal in size. - Mitral valve: Structurally normal valve. There was mild regurgitation. - Left atrium: The atrium was mildly dilated. - Right ventricle: Pacer wire or catheter noted in right ventricle. Systolic function was normal. - Right atrium: The atrium was moderately dilated. Pacer wire or catheter noted in right atrium. - Tricuspid valve: There was  mild regurgitation. - Pulmonary arteries: Systolic pressure was mildly increased. PA peak pressure: 39 mm Hg (S). - Inferior vena cava: The vessel was dilated. The respirophasic diameter changes were blunted (< 50%), consistent with elevated central venous pressure. - Pericardium, extracardiac: There was no pericardial effusion. Impressions: - When compared to the prior study from 05/31/14 the LVEF has improved, now 30-35% with diffuse hypokinesis.    LABS REVIEWED:   03-29-15: wbc 4.9; hgb 9.7; hct 32.2; mcv 78.0; plt 247; glucose 97; bun 12; creat 1.11; k+ 4.0; na++141  09-08-15: wbc 7.2; hgb 7.3; hct 26.5; mcv 69.6; plt clump; glucose 218; bun 15; creat 1.31; k+ 4.0; na++ 137; liver normal albumin 3.0; mag 1.7; hgb a1c 6.8; tsh 1.135 09-10-15: wbc 9.8; hgb 9.0; hxct 31.2. mcv 71.6; plt 179; glucose 75; bun 14; creat 1.16; k+ 4.1; na++ 139 09-11-15: wbc 10.4; hg 8.7; hct 30.1; mcv 72.5; plt 208  09-15-15: wbc 9.2; hgb 9.8; hct 35.6; mcv 72.0; plt 251; glucose 133; bun 18.1; creat 1.04; k+ 4.6; na++ 140; liver normal albumin 3.0; chol 135; ldl 37; tri 82; hdl 81; hgb a1c 5.5 PSA 3.60  10-08-15: wbc 13.7; hgb 9.9; hct 33.3; mcv 74.0; plt 375; glucose 113; bun 23; creat 1.20; k+ 4.6; na++ 136      Review of Systems  Unable to perform ROS: dementia    Physical Exam  Constitutional: No distress.  Frail   Eyes: Conjunctivae are normal.  Neck: Neck supple. No JVD present. No thyromegaly present.    Cardiovascular: Normal rate, regular rhythm and intact distal pulses.   Respiratory: Effort normal and breath sounds normal. No respiratory distress. He has no wheezes.  GI: Soft. Bowel sounds are normal. He exhibits no distension. There is no tenderness.  Musculoskeletal: He exhibits no edema.  Left hemiparesis  Lymphadenopathy:    He has no cervical adenopathy.  Neurological: He is alert.  Skin: Skin is warm and dry. He is not diaphoretic.  Psychiatric: He has a normal mood and affect.      ASSESSMENT/ PLAN:  1.  Bilateral lower extremity pain: will continue tylenol 1 gm  daily for pain and will monitor his status   2. Dyslipidemia: will continue lipitor 10 mg daily ldl is 37   3. Dementia: without significant change; his current weight is 123 pounds is presently stable; will continue aricept 10 mg nightly   4. BPH: will continue flomax 0.4 mg daily   5. Chronic diastoic heart failure: EF 30-35% (10-06-15) is status post ICD placement   is currently not on medications; will not make changes will monitor  6. PE: is stable will continue xarelto 20 mg daily   7. CVA: is neurologically stable will continue xarelto 20 mg daily   8. Depression: will continue zoloft 50 mg daily and takes xanax 0.5 mg nightly for anxiety  As needed   9. CAD: no indications of chest pain present; will continue asa 81 mg daily  10. Glaucoma: will continue xalatan to both eyes.   11. Constipation: will continue senna daily   12. Pneumonia: will complete augmentin and will monitor his status    Time spent with patient  50   minutes >50% time spent counseling; reviewing medical record; tests; labs; and developing future plan of care     Ok Edwards NP [facility]  Contact [phone] Monday through Friday 8am- 5pm  After hours call [phone]

## 2015-10-12 ENCOUNTER — Encounter: Payer: Self-pay | Admitting: Internal Medicine

## 2015-10-12 ENCOUNTER — Non-Acute Institutional Stay (SKILLED_NURSING_FACILITY): Payer: Medicare Other | Admitting: Internal Medicine

## 2015-10-12 DIAGNOSIS — F329 Major depressive disorder, single episode, unspecified: Secondary | ICD-10-CM | POA: Diagnosis not present

## 2015-10-12 DIAGNOSIS — F0280 Dementia in other diseases classified elsewhere without behavioral disturbance: Secondary | ICD-10-CM

## 2015-10-12 DIAGNOSIS — F0150 Vascular dementia without behavioral disturbance: Secondary | ICD-10-CM

## 2015-10-12 DIAGNOSIS — I2510 Atherosclerotic heart disease of native coronary artery without angina pectoris: Secondary | ICD-10-CM | POA: Diagnosis not present

## 2015-10-12 DIAGNOSIS — Z86711 Personal history of pulmonary embolism: Secondary | ICD-10-CM | POA: Diagnosis not present

## 2015-10-12 DIAGNOSIS — E43 Unspecified severe protein-calorie malnutrition: Secondary | ICD-10-CM | POA: Diagnosis not present

## 2015-10-12 DIAGNOSIS — D509 Iron deficiency anemia, unspecified: Secondary | ICD-10-CM

## 2015-10-12 DIAGNOSIS — J189 Pneumonia, unspecified organism: Secondary | ICD-10-CM | POA: Diagnosis not present

## 2015-10-12 DIAGNOSIS — R55 Syncope and collapse: Secondary | ICD-10-CM | POA: Diagnosis not present

## 2015-10-12 DIAGNOSIS — I5042 Chronic combined systolic (congestive) and diastolic (congestive) heart failure: Secondary | ICD-10-CM

## 2015-10-12 DIAGNOSIS — N183 Chronic kidney disease, stage 3 unspecified: Secondary | ICD-10-CM

## 2015-10-12 DIAGNOSIS — F0393 Unspecified dementia, unspecified severity, with mood disturbance: Secondary | ICD-10-CM

## 2015-10-12 NOTE — Progress Notes (Signed)
MRN: [id number] Name: Grant Lara  Sex: male Age: 75 y.o. DOB: 1940-08-24  [place] #:  Facility/Room: [facility] / 096 A Level Of Care: SNF Provider: Noah Delaine. Sheppard Coil, MD Emergency Contacts: Extended Emergency Contact Information Primary Emergency Contact: Candy,[name] Address: [address]          [place], [place] [address] [name] of [place] Phone: [phone] Mobile Phone: [phone] Relation: Spouse Secondary Emergency Contact: [name],[name]          [place], [place] of [place] Mobile Phone: [phone] Relation: Daughter  Code Status: Full Code  Allergies: Review of patient's allergies indicates no known allergies.  Chief Complaint  Patient presents with  . New Admit To SNF    Admit to Facility    HPI: Patient is 75 y.o. male with hypertension, coronary artery disease with stents, chronic systolic CHF, history of embolic stroke, history of pulmonary embolism, COPD, and dementia who presents the emergency department from his SNF for evaluation of the near syncopal episode just prior to arrival. Patient was recently admitted to this institution from 09/29/2015 to 10/04/2015, initially presenting with presyncope, but diagnosed with multifocal pneumonia and treated with broad-spectrum antibiotics, ultimately being discharged back to the SNF in much improved and stable condition. Two weeks prior to that admission, the patient was hospitalized for anemia with hemoglobin of 7.0, requiring transfusion 2 units packed red blood cells, and with workup negative for GI blood loss. He had reportedly been in his usual state back to the SNF today until becoming extremely lightheaded while standing. He was helped down to a seated position without actual loss of consciousness or fall. Patient reports a momentary loss in vision during the episode but denies associated chest pain, palpitations, or headache. He has an ICD which was recently interrogated and no problems identified.  Patient is currently under treatment with Levaquin for his resolving HCAP. He denies fevers or chills, dyspnea, or cough.Pt was admitted to [facility] from 6/22-26 for work up of near syncope. Hospital course was complicated by anemia requiring transfusion and dehydration and acute on chronic KD tx with IVF. Pt is admitted to SNF for generlized weakness and for residential care. While ay SNF pt will be followed for CAD, tx with ASA and lipitor, dementia, tx wit aricept and hx PE, tx with xarelto.  Past Medical History  Diagnosis Date  . Hypertension   . Dilated cardiomyopathy ([place])     2/12: EF 30-35%, trivial AI, mild RAE.  EF 2014 40 -45%  . CAD (coronary artery disease)     LHC 9/05 with Dr. Einar Gip:  dLM 20-30%, LAD 85%, oD1 20-30%.  PCI:  Taxus DES to LAD; Dx jailed and tx with POBA.  Last myoview 12/10: inf scar, no ischemia, EF 29%.  . DVT (deep venous thrombosis) ([place])   . Pulmonary embolus ([facility])     chronic coumadin  . 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 PFO (patent foramen ovale)     Not mentioned on 2014 echo.  [name] HLD (hyperlipidemia)   . Crohn's disease ([place])   . Nephrolithiasis   . Diabetes mellitus     11/05/11 "borderline; don't take medications"  . Stroke [facility]) 1993    "left arm can't hold steady; leg too"  . Arthritis     "used to have a touch in my legs"  . Memory difficulties   . Swelling of both ankles      09-22-13 occ.feet, but denies pain.  . Bradycardia 06/01/2014  . Dementia   .  Hyperlipidemia   . CHF (congestive heart failure) ([place])   . Pneumonia ~ 2011    09-22-13 denies any recent SOB or breathing problems  . HCAP (healthcare-associated pneumonia) 09/29/2015  . AICD (automatic cardioverter/defibrillator) present     Past Surgical History  Procedure Laterality Date  . Colon surgery  1994; 1996    "for Crohn's disease"  . Appendectomy    . Implantable cardioverter defibrillator implant N/A 06/02/2014    Procedure: IMPLANTABLE CARDIOVERTER DEFIBRILLATOR IMPLANT;   Surgeon: [name], MD;  Location: [facility] CATH LAB;  Service: Cardiovascular;  Laterality: N/A;      Medication List       This list is accurate as of: 10/12/15 11:59 PM.  Always use your most recent med list.               acetaminophen 500 MG tablet  Commonly known as:  TYLENOL  Take 1,000 mg by mouth 2 (two) times daily.     ALPRAZolam 0.5 MG tablet  Commonly known as:  XANAX  Take 1 tablet (0.5 mg total) by mouth at bedtime as needed.     amoxicillin-clavulanate 250-125 MG tablet  Commonly known as:  AUGMENTIN  Take 1 tablet by mouth 2 (two) times daily.     aspirin 81 MG tablet  Take 81 mg by mouth daily.     atorvastatin 10 MG tablet  Commonly known as:  LIPITOR  Take 1 tablet (10 mg total) by mouth daily at 6 PM.     donepezil 10 MG tablet  Commonly known as:  ARICEPT  Take 10 mg by mouth at bedtime.     feeding supplement (ENSURE ENLIVE) Liqd  Take 237 mLs by mouth 2 (two) times daily between meals.     latanoprost 0.005 % ophthalmic solution  Commonly known as:  XALATAN  Place 1 drop into both eyes at bedtime.     levalbuterol 0.63 MG/3ML nebulizer solution  Commonly known as:  XOPENEX  Take 3 mLs (0.63 mg total) by nebulization every 6 (six) hours as needed for wheezing or shortness of breath.     rivaroxaban 20 MG Tabs tablet  Commonly known as:  XARELTO  Take 1 tablet (20 mg total) by mouth daily with supper.     senna 8.6 MG Tabs tablet  Commonly known as:  SENOKOT  Take 1 tablet by mouth every morning.     sertraline 50 MG tablet  Commonly known as:  ZOLOFT  Take 50 mg by mouth every morning.     tamsulosin 0.4 MG Caps capsule  Commonly known as:  FLOMAX  Take 1 capsule (0.4 mg total) by mouth daily.        No orders of the defined types were placed in this encounter.    Immunization History  Administered Date(s) Administered  . Influenza Whole 02/09/2010  . PPD Test 11/05/2011  . Pneumococcal Polysaccharide-23 02/19/2010     Social History  Substance Use Topics  . Smoking status: Current Some Day Smoker -- 0.50 packs/day for 53 years    Types: Cigarettes  . Smokeless tobacco: Never Used  . Alcohol Use: No    Family history is   Family History  Problem Relation Age of Onset  . Diabetes type II Mother   . CAD Father   . Diabetes type II Brother       Review of Systems UTO 2/2 dementia     Filed Vitals:   10/12/15 0855  BP: 120/88  Pulse: 66  Temp: 98 F (36.7 C)  Resp: 19    SpO2 Readings from Last 1 Encounters:  10/12/15 97%        Physical Exam  GENERAL APPEARANCE: Alert,min  conversant, very nice BM No acute distress.  SKIN: No diaphoresis rash HEAD: Normocephalic, atraumatic  EYES: Conjunctiva/lids clear. Pupils round, reactive. EOMs intact.  EARS: External exam WNL, canals clear. Hearing grossly normal.  NOSE: No deformity or discharge.  MOUTH/THROAT: Lips w/o lesions  RESPIRATORY: Breathing is even, unlabored. Lung sounds are clear   CARDIOVASCULAR: Heart RRR no murmurs, rubs or gallops. No peripheral edema.   GASTROINTESTINAL: Abdomen is soft, non-tender, not distended w/ normal bowel sounds. GENITOURINARY: Bladder non tender, not distended  MUSCULOSKELETAL: No abnormal joints or musculature NEUROLOGIC:  Cranial nerves 2-12 grossly intact; L side weakness  PSYCHIATRIC: dementia, no behavioral issues  Patient Active Problem List   Diagnosis Date Noted  . Near syncope 10/06/2015  . Microcytic anemia 10/06/2015  . Absolute anemia   . Lactic acidosis 09/30/2015  . Protein-calorie malnutrition, moderate ([place]) 09/30/2015  . 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 ICD (implantable cardioverter-defibrillator) in place 09/29/2015  . HCAP (healthcare-associated pneumonia) 09/29/2015  . Hypoxia 09/29/2015  . Vascular dementia without behavioral disturbance 09/25/2015  . Anemia 09/08/2015  . Fall 09/08/2015  . Depression 07/15/2015  . OA (osteoarthritis)  06/06/2015  . Bilateral lower extremity pain 05/14/2015  . BPH (benign prostatic hyperplasia) 04/10/2015  . Anxiety 04/10/2015  . FTT (failure to thrive) in adult 11/05/2014  . Mental status change 10/30/2014  . Protein-calorie malnutrition, severe ([place]) 10/30/2014  . Increased urinary frequency 10/30/2014  . Faintness   . Generalized weakness 09/01/2014  . 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 Cardiac syncope   . Other specified hypotension   . Chronic combined systolic and diastolic CHF (congestive heart failure) ([place])   . Late effects of CVA (cerebrovascular accident) 07/16/2014  . Bradycardia 06/01/2014  . Syncope 05/30/2014  . CVA (cerebral infarction)   . Hyperlipidemia   . Coronary artery disease involving native coronary artery of native heart without angina pectoris   . LVH (left ventricular hypertrophy)   . NSVT (nonsustained ventricular tachycardia) ([place]) 04/16/2014  . CKD (chronic kidney disease) stage 3, GFR 30-59 ml/min 04/16/2014  . Aphasia 04/15/2014  . Abnormal EKG   . Anemia, chronic disease 11/21/2013  . DM2 (diabetes mellitus, type 2) ([place]) 11/20/2013  . Hx pulmonary embolism 11/20/2013  . Leukocytosis 11/20/2013  . Hyperglycemia 08/22/2013  . Dehydration 08/21/2013  . ARF (acute renal failure) ([place]) 08/21/2013  . Abnormal ECG 08/21/2013  . Delirium 07/18/2013  . Small bowel obstruction ([place]) 07/13/2013  . Encounter for therapeutic drug monitoring 05/11/2013  . Crohn's disease ([place]) 02/16/2012  . Abnormal CT scan of lung 11/08/2011  . Bilateral leg and foot pain 11/06/2011  . HLD (hyperlipidemia) 03/28/2011  . Personal history of PE (pulmonary embolism) 06/05/2010  . History of embolic stroke 74/11/1446  . DVT (deep venous thrombosis) ([place]) 06/05/2010  . EMPHYSEMATOUS BLEB 04/06/2010  . COPD (chronic obstructive pulmonary disease) ([place]) 04/05/2010  . Chronic systolic heart failure ([place]) 01/10/2010  . Memory loss 04/03/2009  . CHEST PAIN 03/08/2009   . Hypertensive heart disease with CHF (congestive heart failure) ([place]) 02/10/2009  . TOBACCO ABUSE 12/12/2008  . Dilated cardiomyopathy ([place]) 12/12/2008  . ABNORMAL ELECTROCARDIOGRAM 12/12/2008       Component Value Date/Time   WBC 13.7* 10/08/2015 0446   WBC 13.7  10/08/2015   RBC 4.50 10/08/2015 0446   RBC 4.34 09/09/2015 1036   HGB 9.9* 10/08/2015 0446   HCT 33.3* 10/08/2015 0446   PLT 375 10/08/2015 0446   MCV 74.0* 10/08/2015 0446   LYMPHSABS 1.4 10/05/2015 2257   MONOABS 1.2* 10/05/2015 2257   EOSABS 0.0 10/05/2015 2257   BASOSABS 0.0 10/05/2015 2257        Component Value Date/Time   NA 136 10/08/2015 0446   NA 136* 10/08/2015   K 4.6 10/08/2015 0446   CL 101 10/08/2015 0446   CO2 29 10/08/2015 0446   GLUCOSE 113* 10/08/2015 0446   BUN 23* 10/08/2015 0446   BUN 23* 10/08/2015   CREATININE 1.20 10/08/2015 0446   CREATININE 1.2 10/08/2015   CREATININE 1.34 12/13/2011 1722   CALCIUM 9.4 10/08/2015 0446   PROT 6.1* 10/06/2015 0519   ALBUMIN 2.1* 10/06/2015 0519   AST 122* 10/06/2015 0519   ALT 221* 10/06/2015 0519   ALKPHOS 152* 10/06/2015 0519   BILITOT 0.2* 10/06/2015 0519   GFRNONAA 57* 10/08/2015 0446   GFRAA >60 10/08/2015 0446    Lab Results  Component Value Date   HGBA1C 5.5 09/15/2015    Lab Results  Component Value Date   CHOL 135 09/15/2015   HDL 81* 09/15/2015   LDLCALC 37 09/15/2015   TRIG 82 09/15/2015   CHOLHDL 3.1 04/16/2014     Dg Chest 2 View  10/05/2015  CLINICAL DATA:  75 year old male with near syncope EXAM: CHEST  2 VIEW COMPARISON:  Chest radiograph dated 09/30/2015 and chest CT dated 09/29/2015 FINDINGS: Two views of the chest demonstrate an area of increased density in the retrocardiac region, likely atelectasis/ scarring versus infiltrate. There is mild blunting of the left costophrenic angle which may represent trace pleural effusion. No pneumothorax. The cardiac silhouette is within normal limits. Left pectoral AICD device.  There is degenerative changes of the spine. No acute osseous pathology. IMPRESSION: Retrocardiac density may represent atelectasis versus infiltrate. Electronically Signed   By: Anner Crete M.D.   On: 10/05/2015 22:58   US Abdomen Limited Ruq  10/06/2015  CLINICAL DATA:  Elevated LFTs. EXAM: US ABDOMEN LIMITED - RIGHT UPPER QUADRANT COMPARISON:  05/02/2014. FINDINGS: Gallbladder: Small 9 mm gallstone noted. Gallbladder wall thickness 1.2 mm. No pericholecystic fluid collection. Negative Murphy sign. Common bile duct: Diameter: 4.7 mm Liver: 9 mm simple cyst is noted in the right lobe of the liver. Liver is slightly echogenic suggesting fatty infiltration. IMPRESSION: 1.  Small gallstone.  No biliary distention. 2. Liver is slightly echogenic suggesting fatty infiltration . Electronically Signed   By: Marcello Moores  Register   On: 10/06/2015 11:39    Not all labs, radiology exams or other studies done during hospitalization come through on my EPIC note; however they are reviewed by me.    Assessment and Plan  Near syncope  -Pt reports lightheadedness and near-syncope upon standing for ~1 month and was just discharged after presenting with the same  -Suspect a neurally-mediated reflex syncope along with severe deconditioning  -echo w/o abnormalities to explain symptoms -ICD has not fire -Lab work performed on 10/08/2015 showing improved renal function, with creatinine trending down to 1.2 -By 10/09/2015 he seemed to be doing much better, did not report any further episodes of near syncope. He was tolerating by mouth intake, remained afebrile, appeared to be functioning at his baseline. SNF - admitted for residential care and OT/PT  2. HCAP/aspiration PNA -White count coming down to 13,700 from 17,000 -  Showing clinical improvement SNF - cont  Augmentin for 4 more days  3. Chronic combined systolic/diastolic CHF  -Appeared dehydrated at time of admission  -overall compensated and with echo  demonstrating slight improvement in EF SNF - was on diuretic and  beta-blocker prior; Continue to follow volume status  4. CAD -No anginal complaints; no change in EKG; troponin wnl  SNF -continue ASA and Lipitor   5. Microcytic anemia  -Hgb 7.4; MCV 72.5 on admission  -given hx of CAD and chronic anticoagulation received 2 units of PRBC's -Hgb 9.8 after transfusion -Repeat labs on 10/08/2015 showing stable hemoglobin of 9.9 SNF - will check CBC; need to start FeSO4 325 mg daily  6. Dementia -At baseline SNF-will continue aricept  7. Depression  -Appears to be stable on admission  SNF-continue zoloft, but dose adjusted to decrease risk for orthostatic changes   8. Hx of PE  -no overt bleeding appreciated SNF - cont xarelto    9. CKD stage III  -SCr 1.34 on admission, up from apparent baseline of ~1.1  -received gentle hydration and 2 units of PRBC's -On 10/08/2015 labs show an improvement to kidney function with creatinine of 1.2. SNF - will follow up BMP  10. Protein-calorie malnutrition, Severe -Pt appears cachectic with bitemporal wasting, BMI 17.5, albumin 2.3  SNF -Continue feeding supplements     Time spent > 45 min;> 50% of time with patient was spent reviewing records, labs, tests and studies, counseling and developing plan of care  Webb Silversmith D. Sheppard Coil, MD

## 2015-10-29 ENCOUNTER — Encounter: Payer: Self-pay | Admitting: Internal Medicine

## 2015-10-30 NOTE — Progress Notes (Signed)
   09/11/15 1025  PT Time Calculation  PT Start Time (ACUTE ONLY) 0922  PT Stop Time (ACUTE ONLY) 0955  PT Time Calculation (min) (ACUTE ONLY) 33 min  PT G-Codes **NOT FOR INPATIENT CLASS**  Functional Assessment Tool Used (clinical judgement)  Functional Limitation Mobility: Walking and moving around  Mobility: Walking and Moving Around Current Status (W9090) CJ  Mobility: Walking and Moving Around Goal Status (B0149) CI  PT General Charges  $$ ACUTE PT VISIT 1 Procedure  PT Evaluation  $PT Eval Low Complexity 1 Procedure  PT Treatments  $Therapeutic Activity 8-22 mins   Weston Anna, MPT [phone]

## 2015-11-10 ENCOUNTER — Non-Acute Institutional Stay (SKILLED_NURSING_FACILITY): Payer: Medicare Other | Admitting: Adult Health

## 2015-11-10 ENCOUNTER — Encounter: Payer: Self-pay | Admitting: Adult Health

## 2015-11-10 DIAGNOSIS — Z95810 Presence of automatic (implantable) cardiac defibrillator: Secondary | ICD-10-CM

## 2015-11-10 DIAGNOSIS — I5042 Chronic combined systolic (congestive) and diastolic (congestive) heart failure: Secondary | ICD-10-CM | POA: Diagnosis not present

## 2015-11-10 DIAGNOSIS — E785 Hyperlipidemia, unspecified: Secondary | ICD-10-CM | POA: Diagnosis not present

## 2015-11-10 DIAGNOSIS — I110 Hypertensive heart disease with heart failure: Secondary | ICD-10-CM

## 2015-11-10 DIAGNOSIS — M79672 Pain in left foot: Secondary | ICD-10-CM

## 2015-11-10 DIAGNOSIS — M79671 Pain in right foot: Secondary | ICD-10-CM

## 2015-11-10 DIAGNOSIS — I6340 Cerebral infarction due to embolism of unspecified cerebral artery: Secondary | ICD-10-CM

## 2015-11-10 DIAGNOSIS — N400 Enlarged prostate without lower urinary tract symptoms: Secondary | ICD-10-CM

## 2015-11-10 DIAGNOSIS — F0150 Vascular dementia without behavioral disturbance: Secondary | ICD-10-CM

## 2015-11-10 DIAGNOSIS — M79604 Pain in right leg: Secondary | ICD-10-CM

## 2015-11-10 DIAGNOSIS — M79605 Pain in left leg: Secondary | ICD-10-CM | POA: Diagnosis not present

## 2015-11-10 NOTE — Progress Notes (Signed)
Patient ID: Grant Lara, male   DOB: 08/25/1940, 75 y.o.   MRN: [id number]    Location:   [place] Room Number: 107-B Place of Service:  SNF (31)   CODE STATUS: Full Code  No Known Allergies  Chief Complaint  Patient presents with  . Medical Management of Chronic Issues    Follow up    HPI:  He is a long term resident of this facility being seen for the management of his chronic illnesses. Overall his status is without change in his status. His weight is at 123 pounds.  He is unable to fully participate in the hpi or ros. There are no nursing concerns at this time.   Past Medical History:  Diagnosis Date  . AICD (automatic cardioverter/defibrillator) present   . Arthritis    "used to have a touch in my legs"  . Bradycardia 06/01/2014  . CAD (coronary artery disease)    LHC 9/05 with Dr. Einar Gip:  dLM 20-30%, LAD 85%, oD1 20-30%.  PCI:  Taxus DES to LAD; Dx jailed and tx with POBA.  Last myoview 12/10: inf scar, no ischemia, EF 29%.  . CHF (congestive heart failure) ([place])   . Crohn's disease ([place])   . Dementia   . Diabetes mellitus    11/05/11 "borderline; don't take medications"  . Dilated cardiomyopathy ([place])    2/12: EF 30-35%, trivial AI, mild RAE.  EF 2014 40 -45%  . DVT (deep venous thrombosis) ([place])   . HCAP (healthcare-associated pneumonia) 09/29/2015  . HLD (hyperlipidemia)   . 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 Hyperlipidemia   . Hypertension   . Memory difficulties   . Nephrolithiasis   . PFO (patent foramen ovale)    Not mentioned on 2014 echo.  . Pneumonia ~ 2011   09-22-13 denies any recent SOB or breathing problems  . Pulmonary embolus (HCC)    chronic coumadin  . Stroke [facility]) 1993   "left arm can't hold steady; leg too"  . Swelling of both ankles     09-22-13 occ.feet, but denies pain.    Past Surgical History:  Procedure Laterality Date  . APPENDECTOMY    . COLON SURGERY  1994; 1996   "for Crohn's disease"  . IMPLANTABLE CARDIOVERTER  DEFIBRILLATOR IMPLANT N/A 06/02/2014   Procedure: IMPLANTABLE CARDIOVERTER DEFIBRILLATOR IMPLANT;  Surgeon: [name], MD;  Location: [facility] CATH LAB;  Service: Cardiovascular;  Laterality: N/A;    Social History   Social History  . Marital status: Married    Spouse name: N/A  . Number of children: N/A  . Years of education: N/A   Occupational History  . Not on file.   Social History Main Topics  . Smoking status: Current Some Day Smoker    Packs/day: 0.50    Years: 53.00    Types: Cigarettes  . Smokeless tobacco: Never Used  . Alcohol use No  . Drug use: No  . Sexual activity: Not Currently   Other Topics Concern  . Not on file   Social History Narrative  . No narrative on file   Family History  Problem Relation Age of Onset  . Diabetes type II Mother   . CAD Father   . Diabetes type II Brother       VITAL SIGNS BP 112/63   Pulse 80   Temp 97.7 F (36.5 C) (Oral)   Resp 17   Ht 5' 3"  (1.6 m)   Wt 123 lb (55.8 kg)  SpO2 97%   BMI 21.79 kg/m   Patient's Medications  New Prescriptions   No medications on file  Previous Medications   ACETAMINOPHEN (TYLENOL) 500 MG TABLET    Take 1,000 mg by mouth daily.    ALPRAZOLAM (XANAX) 0.5 MG TABLET    Take 1 tablet (0.5 mg total) by mouth at bedtime as needed.   ASPIRIN 81 MG TABLET    Take 81 mg by mouth daily.   ATORVASTATIN (LIPITOR) 10 MG TABLET    Take 1 tablet (10 mg total) by mouth daily at 6 PM.   DONEPEZIL (ARICEPT) 10 MG TABLET    Take 10 mg by mouth at bedtime.    FEEDING SUPPLEMENT, ENSURE ENLIVE, (ENSURE ENLIVE) LIQD    Take 237 mLs by mouth 2 (two) times daily between meals.   LATANOPROST (XALATAN) 0.005 % OPHTHALMIC SOLUTION    Place 1 drop into both eyes at bedtime.   LEVALBUTEROL (XOPENEX) 0.63 MG/3ML NEBULIZER SOLUTION    Take 3 mLs (0.63 mg total) by nebulization every 6 (six) hours as needed for wheezing or shortness of breath.   RIVAROXABAN (XARELTO) 20 MG TABS TABLET    Take 1 tablet (20 mg  total) by mouth daily with supper.   SENNA (SENOKOT) 8.6 MG TABS TABLET    Take 1 tablet by mouth every morning.   SERTRALINE (ZOLOFT) 50 MG TABLET    Take 50 mg by mouth every morning.    TAMSULOSIN (FLOMAX) 0.4 MG CAPS CAPSULE    Take 1 capsule (0.4 mg total) by mouth daily.  Modified Medications   No medications on file  Discontinued Medications   AMOXICILLIN-CLAVULANATE (AUGMENTIN) 250-125 MG TABLET    Take 1 tablet by mouth 2 (two) times daily.     SIGNIFICANT DIAGNOSTIC EXAMS  09-08-15: KUB: No evidence of bowel obstruction. No acute fracture noted.  09-08-15: chest x-ray: No acute cardiopulmonary disease. COPD.  09-08-15: ct of head and cervical spine: 1. No evidence of acute intracranial or cervical spine injury. 2. Extensive chronic ischemic injury as described, stable from 12/21/2014 comparison.  09-09-15: EEG: Clinical Interpretation: This essentially normal EEG is recorded in the waking and sleep state. There was no seizure or seizure predisposition recorded on this study. Please note that a normal EEG does not preclude the possibility of epilepsy.  6-16 ct of head: No acute abnormality. Extensive atrophy and multiple remote infarctions as seen on prior exams  09-30-15: chest x-ray: No acute cardiopulmonary abnormality seen.  09-30-15: swallow study: Regular solids;Thin liquid   10-05-15: chest x-ray: Retrocardiac density may represent atelectasis versus infiltrate.  10-06-15: right upper quad ultrasound: 1.  Small gallstone.  No biliary distention. 2. Liver is slightly echogenic suggesting fatty infiltration .  10-06-15: 2-d echo:   - Left ventricle: The cavity size was normal. Systolic function was moderately to severely reduced. The estimated ejection fraction was in the range of 30% to 35%. Diffuse hypokinesis. Doppler parameters are consistent with abnormal left ventricular relaxation (grade 1 diastolic dysfunction). There was no evidence of elevated ventricular filling  pressure by Doppler parameters. - Aortic valve: There was mild regurgitation. - Aortic root: The aortic root was normal in size. - Mitral valve: Structurally normal valve. There was mild regurgitation. - Left atrium: The atrium was mildly dilated. - Right ventricle: Pacer wire or catheter noted in right ventricle. Systolic function was normal. - Right atrium: The atrium was moderately dilated. Pacer wire or catheter noted in right atrium. - Tricuspid valve: There was  mild regurgitation. - Pulmonary arteries: Systolic pressure was mildly increased. PA peak pressure: 39 mm Hg (S). - Inferior vena cava: The vessel was dilated. The respirophasic diameter changes were blunted (< 50%), consistent with elevated central venous pressure. - Pericardium, extracardiac: There was no pericardial effusion. Impressions: - When compared to the prior study from 05/31/14 the LVEF has improved, now 30-35% with diffuse hypokinesis.  10-11-15: chest x-ray: right lower lobe atelectasis; no active TB.     LABS REVIEWED:   03-29-15: wbc 4.9; hgb 9.7; hct 32.2; mcv 78.0; plt 247; glucose 97; bun 12; creat 1.11; k+ 4.0; na++141  09-08-15: wbc 7.2; hgb 7.3; hct 26.5; mcv 69.6; plt clump; glucose 218; bun 15; creat 1.31; k+ 4.0; na++ 137; liver normal albumin 3.0; mag 1.7; hgb a1c 6.8; tsh 1.135 09-10-15: wbc 9.8; hgb 9.0; hxct 31.2. mcv 71.6; plt 179; glucose 75; bun 14; creat 1.16; k+ 4.1; na++ 139 09-11-15: wbc 10.4; hg 8.7; hct 30.1; mcv 72.5; plt 208  09-15-15: wbc 9.2; hgb 9.8; hct 35.6; mcv 72.0; plt 251; glucose 133; bun 18.1; creat 1.04; k+ 4.6; na++ 140; liver normal albumin 3.0; chol 135; ldl 37; tri 82; hdl 81; hgb a1c 5.5 PSA 3.60  10-08-15: wbc 13.7; hgb 9.9; hct 33.3; mcv 74.0; plt 375; glucose 113; bun 23; creat 1.20; k+ 4.6; na++ 136      Review of Systems  Unable to perform ROS: dementia    Physical Exam  Constitutional: No distress.  Frail   Eyes: Conjunctivae are normal.  Neck: Neck supple. No  JVD present. No thyromegaly present.  Cardiovascular: Normal rate, regular rhythm and intact distal pulses.   Respiratory: Effort normal and breath sounds normal. No respiratory distress. He has no wheezes.  GI: Soft. Bowel sounds are normal. He exhibits no distension. There is no tenderness.  Musculoskeletal: He exhibits no edema.  Left hemiparesis  Lymphadenopathy:    He has no cervical adenopathy.  Neurological: He is alert.  Skin: Skin is warm and dry. He is not diaphoretic.  Psychiatric: He has a normal mood and affect.      ASSESSMENT/ PLAN:  1.  Bilateral lower extremity pain: will continue tylenol 1 gm  daily for pain and will monitor his status   2. Dyslipidemia: will continue lipitor 10 mg daily ldl is 37   3. Dementia: without significant change; his current weight is 123 pounds is presently stable; will continue aricept 10 mg nightly   4. BPH: will continue flomax 0.4 mg daily   5. Chronic diastoic heart failure: EF 30-35% (10-06-15) is status post ICD placement   is currently not on medications; will not make changes will monitor  6. PE: is stable will continue xarelto 20 mg daily   7. CVA: is neurologically stable will continue xarelto 20 mg daily   8. Depression: will continue zoloft 50 mg daily and takes xanax 0.5 mg nightly for anxiety  As needed   9. CAD: no indications of chest pain present; will continue asa 81 mg daily  10. Glaucoma: will continue xalatan to both eyes.   11. Constipation: will continue senna daily    Ok Edwards NP [facility]  Contact [phone] Monday through Friday 8am- 5pm  After hours call [phone]

## 2015-11-21 ENCOUNTER — Non-Acute Institutional Stay (SKILLED_NURSING_FACILITY): Payer: Medicare Other | Admitting: Adult Health

## 2015-11-21 ENCOUNTER — Encounter: Payer: Self-pay | Admitting: Adult Health

## 2015-11-21 DIAGNOSIS — R55 Syncope and collapse: Secondary | ICD-10-CM | POA: Diagnosis not present

## 2015-11-21 DIAGNOSIS — I6340 Cerebral infarction due to embolism of unspecified cerebral artery: Secondary | ICD-10-CM

## 2015-11-21 NOTE — Progress Notes (Signed)
Patient ID: Grant Lara, male   DOB: Jul 24, 1940, 75 y.o.   MRN: [id number]   Location:   [place] Room Number: 107-B Place of Service:  SNF (31)   CODE STATUS: Full Code  No Known Allergies  Chief Complaint  Patient presents with  . Acute Visit    HPI:  Staff reports that while on the commode he passed out. He quickly recovered and is in bed. There is signs of any change in his status. His vital signs are stable. He is unable to participate in the hpi or ros.    Past Medical History:  Diagnosis Date  . AICD (automatic cardioverter/defibrillator) present   . Arthritis    "used to have a touch in my legs"  . Bradycardia 06/01/2014  . CAD (coronary artery disease)    LHC 9/05 with Dr. Einar Gip:  dLM 20-30%, LAD 85%, oD1 20-30%.  PCI:  Taxus DES to LAD; Dx jailed and tx with POBA.  Last myoview 12/10: inf scar, no ischemia, EF 29%.  . CHF (congestive heart failure) ([place])   . Crohn's disease ([place])   . Dementia   . Diabetes mellitus    11/05/11 "borderline; don't take medications"  . Dilated cardiomyopathy ([place])    2/12: EF 30-35%, trivial AI, mild RAE.  EF 2014 40 -45%  . DVT (deep venous thrombosis) ([place])   . HCAP (healthcare-associated pneumonia) 09/29/2015  . HLD (hyperlipidemia)   . 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 Hyperlipidemia   . Hypertension   . Memory difficulties   . Nephrolithiasis   . PFO (patent foramen ovale)    Not mentioned on 2014 echo.  . Pneumonia ~ 2011   09-22-13 denies any recent SOB or breathing problems  . Pulmonary embolus (HCC)    chronic coumadin  . Stroke [facility]) 1993   "left arm can't hold steady; leg too"  . Swelling of both ankles     09-22-13 occ.feet, but denies pain.    Past Surgical History:  Procedure Laterality Date  . APPENDECTOMY    . COLON SURGERY  1994; 1996   "for Crohn's disease"  . IMPLANTABLE CARDIOVERTER DEFIBRILLATOR IMPLANT N/A 06/02/2014   Procedure: IMPLANTABLE CARDIOVERTER DEFIBRILLATOR IMPLANT;  Surgeon:  [name], MD;  Location: [facility] CATH LAB;  Service: Cardiovascular;  Laterality: N/A;    Social History   Social History  . Marital status: Married    Spouse name: N/A  . Number of children: N/A  . Years of education: N/A   Occupational History  . Not on file.   Social History Main Topics  . Smoking status: Current Some Day Smoker    Packs/day: 0.50    Years: 53.00    Types: Cigarettes  . Smokeless tobacco: Never Used  . Alcohol use No  . Drug use: No  . Sexual activity: Not Currently   Other Topics Concern  . Not on file   Social History Narrative  . No narrative on file   Family History  Problem Relation Age of Onset  . Diabetes type II Mother   . CAD Father   . Diabetes type II Brother       VITAL SIGNS BP 105/78   Pulse 88   Temp 98.1 F (36.7 C) (Oral)   Resp 20   Ht 5' 3"  (1.6 m)   Wt 121 lb 4 oz (55 kg)   SpO2 96%   BMI 21.48 kg/m   Patient's Medications  New Prescriptions   No medications  on file  Previous Medications   ACETAMINOPHEN (TYLENOL) 500 MG TABLET    Take 1,000 mg by mouth daily.    ALPRAZOLAM (XANAX) 0.5 MG TABLET    Take 1 tablet (0.5 mg total) by mouth at bedtime as needed.   ASPIRIN 81 MG TABLET    Take 81 mg by mouth daily.   ATORVASTATIN (LIPITOR) 10 MG TABLET    Take 1 tablet (10 mg total) by mouth daily at 6 PM.   DONEPEZIL (ARICEPT) 10 MG TABLET    Take 10 mg by mouth at bedtime.    FEEDING SUPPLEMENT, ENSURE ENLIVE, (ENSURE ENLIVE) LIQD    Take 237 mLs by mouth 2 (two) times daily between meals.   LATANOPROST (XALATAN) 0.005 % OPHTHALMIC SOLUTION    Place 1 drop into both eyes at bedtime.   LEVALBUTEROL (XOPENEX) 0.63 MG/3ML NEBULIZER SOLUTION    Take 3 mLs (0.63 mg total) by nebulization every 6 (six) hours as needed for wheezing or shortness of breath.   RIVAROXABAN (XARELTO) 20 MG TABS TABLET    Take 1 tablet (20 mg total) by mouth daily with supper.   SENNA (SENOKOT) 8.6 MG TABS TABLET    Take 1 tablet by mouth every  morning.   SERTRALINE (ZOLOFT) 50 MG TABLET    Take 50 mg by mouth every morning.    TAMSULOSIN (FLOMAX) 0.4 MG CAPS CAPSULE    Take 1 capsule (0.4 mg total) by mouth daily.  Modified Medications   No medications on file  Discontinued Medications   No medications on file     SIGNIFICANT DIAGNOSTIC EXAMS  09-08-15: KUB: No evidence of bowel obstruction. No acute fracture noted.  09-08-15: chest x-ray: No acute cardiopulmonary disease. COPD.  09-08-15: ct of head and cervical spine: 1. No evidence of acute intracranial or cervical spine injury. 2. Extensive chronic ischemic injury as described, stable from 12/21/2014 comparison.  09-09-15: EEG: Clinical Interpretation: This essentially normal EEG is recorded in the waking and sleep state. There was no seizure or seizure predisposition recorded on this study. Please note that a normal EEG does not preclude the possibility of epilepsy.  6-16 ct of head: No acute abnormality. Extensive atrophy and multiple remote infarctions as seen on prior exams  09-30-15: chest x-ray: No acute cardiopulmonary abnormality seen.  09-30-15: swallow study: Regular solids;Thin liquid   10-05-15: chest x-ray: Retrocardiac density may represent atelectasis versus infiltrate.  10-06-15: right upper quad ultrasound: 1.  Small gallstone.  No biliary distention. 2. Liver is slightly echogenic suggesting fatty infiltration .  10-06-15: 2-d echo:   - Left ventricle: The cavity size was normal. Systolic function was moderately to severely reduced. The estimated ejection fraction was in the range of 30% to 35%. Diffuse hypokinesis. Doppler parameters are consistent with abnormal left ventricular relaxation (grade 1 diastolic dysfunction). There was no evidence of elevated ventricular filling pressure by Doppler parameters. - Aortic valve: There was mild regurgitation. - Aortic root: The aortic root was normal in size. - Mitral valve: Structurally normal valve. There was  mild regurgitation. - Left atrium: The atrium was mildly dilated. - Right ventricle: Pacer wire or catheter noted in right ventricle. Systolic function was normal. - Right atrium: The atrium was moderately dilated. Pacer wire or catheter noted in right atrium. - Tricuspid valve: There was mild regurgitation. - Pulmonary arteries: Systolic pressure was mildly increased. PA peak pressure: 39 mm Hg (S). - Inferior vena cava: The vessel was dilated. The respirophasic diameter changes were blunted (<  50%), consistent with elevated central venous pressure. - Pericardium, extracardiac: There was no pericardial effusion. Impressions: - When compared to the prior study from 05/31/14 the LVEF has improved, now 30-35% with diffuse hypokinesis.  10-11-15: chest x-ray: right lower lobe atelectasis; no active TB.     LABS REVIEWED:   03-29-15: wbc 4.9; hgb 9.7; hct 32.2; mcv 78.0; plt 247; glucose 97; bun 12; creat 1.11; k+ 4.0; na++141  09-08-15: wbc 7.2; hgb 7.3; hct 26.5; mcv 69.6; plt clump; glucose 218; bun 15; creat 1.31; k+ 4.0; na++ 137; liver normal albumin 3.0; mag 1.7; hgb a1c 6.8; tsh 1.135 09-10-15: wbc 9.8; hgb 9.0; hxct 31.2. mcv 71.6; plt 179; glucose 75; bun 14; creat 1.16; k+ 4.1; na++ 139 09-11-15: wbc 10.4; hg 8.7; hct 30.1; mcv 72.5; plt 208  09-15-15: wbc 9.2; hgb 9.8; hct 35.6; mcv 72.0; plt 251; glucose 133; bun 18.1; creat 1.04; k+ 4.6; na++ 140; liver normal albumin 3.0; chol 135; ldl 37; tri 82; hdl 81; hgb a1c 5.5 PSA 3.60  10-08-15: wbc 13.7; hgb 9.9; hct 33.3; mcv 74.0; plt 375; glucose 113; bun 23; creat 1.20; k+ 4.6; na++ 136      Review of Systems  Unable to perform ROS: dementia    Physical Exam  Constitutional: No distress.  Frail   Eyes: Conjunctivae are normal.  Neck: Neck supple. No JVD present. No thyromegaly present.  Cardiovascular: Normal rate, regular rhythm and intact distal pulses.   Respiratory: Effort normal and breath sounds normal. No respiratory  distress. He has no wheezes.  GI: Soft. Bowel sounds are normal. He exhibits no distension. There is no tenderness.  Musculoskeletal: He exhibits no edema.  Left hemiparesis  Lymphadenopathy:    He has no cervical adenopathy.  Neurological: He is alert.  Skin: Skin is warm and dry. He is not diaphoretic.  Psychiatric: He has a normal mood and affect.      ASSESSMENT/ PLAN:  1. CVA: is neurologically without change; will  continue xarelto 20 mg daily   Will have nursing monitor blood pressure twice daily; will check cbc; cmp; and will monitor his constipation in order for him to avoid straining.     MD is aware of resident's narcotic use and is in agreement with current plan of care. We will attempt to wean resident as apropriate   Ok Edwards NP [facility]  Contact [phone] Monday through Friday 8am- 5pm  After hours call [phone]

## 2015-12-13 ENCOUNTER — Non-Acute Institutional Stay (SKILLED_NURSING_FACILITY): Payer: Medicare Other | Admitting: Adult Health

## 2015-12-13 ENCOUNTER — Encounter: Payer: Self-pay | Admitting: Adult Health

## 2015-12-13 DIAGNOSIS — I2510 Atherosclerotic heart disease of native coronary artery without angina pectoris: Secondary | ICD-10-CM

## 2015-12-13 DIAGNOSIS — F0280 Dementia in other diseases classified elsewhere without behavioral disturbance: Secondary | ICD-10-CM

## 2015-12-13 DIAGNOSIS — F0393 Unspecified dementia, unspecified severity, with mood disturbance: Secondary | ICD-10-CM

## 2015-12-13 DIAGNOSIS — I110 Hypertensive heart disease with heart failure: Secondary | ICD-10-CM | POA: Diagnosis not present

## 2015-12-13 DIAGNOSIS — J449 Chronic obstructive pulmonary disease, unspecified: Secondary | ICD-10-CM | POA: Diagnosis not present

## 2015-12-13 DIAGNOSIS — F329 Major depressive disorder, single episode, unspecified: Secondary | ICD-10-CM | POA: Diagnosis not present

## 2015-12-13 DIAGNOSIS — I5022 Chronic systolic (congestive) heart failure: Secondary | ICD-10-CM

## 2015-12-13 DIAGNOSIS — F0150 Vascular dementia without behavioral disturbance: Secondary | ICD-10-CM

## 2015-12-13 DIAGNOSIS — Z86711 Personal history of pulmonary embolism: Secondary | ICD-10-CM

## 2015-12-13 NOTE — Progress Notes (Signed)
Patient ID: Grant Lara, male   DOB: 04/19/1940, 75 y.o.   MRN: [id number]   Location:    [place] Room Number: 107-B Place of Service:  SNF (31)   CODE STATUS: Full Code  No Known Allergies  Chief Complaint  Patient presents with  . Medical Management of Chronic Issues    Follow up    HPI:    Past Medical History:  Diagnosis Date  . AICD (automatic cardioverter/defibrillator) present   . Arthritis    "used to have a touch in my legs"  . Bradycardia 06/01/2014  . CAD (coronary artery disease)    LHC 9/05 with Dr. Einar Gip:  dLM 20-30%, LAD 85%, oD1 20-30%.  PCI:  Taxus DES to LAD; Dx jailed and tx with POBA.  Last myoview 12/10: inf scar, no ischemia, EF 29%.  . CHF (congestive heart failure) ([place])   . Crohn's disease ([place])   . Dementia   . Diabetes mellitus    11/05/11 "borderline; don't take medications"  . Dilated cardiomyopathy ([place])    2/12: EF 30-35%, trivial AI, mild RAE.  EF 2014 40 -45%  . DVT (deep venous thrombosis) ([place])   . HCAP (healthcare-associated pneumonia) 09/29/2015  . HLD (hyperlipidemia)   . 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 Hyperlipidemia   . Hypertension   . Memory difficulties   . Nephrolithiasis   . PFO (patent foramen ovale)    Not mentioned on 2014 echo.  . Pneumonia ~ 2011   09-22-13 denies any recent SOB or breathing problems  . Pulmonary embolus (HCC)    chronic coumadin  . Stroke [facility]) 1993   "left arm can't hold steady; leg too"  . Swelling of both ankles     09-22-13 occ.feet, but denies pain.    Past Surgical History:  Procedure Laterality Date  . APPENDECTOMY    . COLON SURGERY  1994; 1996   "for Crohn's disease"  . IMPLANTABLE CARDIOVERTER DEFIBRILLATOR IMPLANT N/A 06/02/2014   Procedure: IMPLANTABLE CARDIOVERTER DEFIBRILLATOR IMPLANT;  Surgeon: [name], MD;  Location: [facility] CATH LAB;  Service: Cardiovascular;  Laterality: N/A;    Social History   Social History  . Marital status: Married    Spouse name: N/A   . Number of children: N/A  . Years of education: N/A   Occupational History  . Not on file.   Social History Main Topics  . Smoking status: Current Some Day Smoker    Packs/day: 0.50    Years: 53.00    Types: Cigarettes  . Smokeless tobacco: Never Used  . Alcohol use No  . Drug use: No  . Sexual activity: Not Currently   Other Topics Concern  . Not on file   Social History Narrative  . No narrative on file   Family History  Problem Relation Age of Onset  . Diabetes type II Mother   . CAD Father   . Diabetes type II Brother       VITAL SIGNS BP 102/61   Pulse 82   Temp 97.6 F (36.4 C) (Oral)   Resp 20   Ht 5' 3"  (1.6 m)   Wt 124 lb (56.2 kg)   SpO2 99%   BMI 21.97 kg/m   Patient's Medications  New Prescriptions   No medications on file  Previous Medications   ACETAMINOPHEN (TYLENOL) 500 MG TABLET    Take 1,000 mg by mouth daily.    ALPRAZOLAM (XANAX) 0.5 MG TABLET    Take 1 tablet (  0.5 mg total) by mouth at bedtime as needed.   ASPIRIN 81 MG TABLET    Take 81 mg by mouth daily.   ATORVASTATIN (LIPITOR) 10 MG TABLET    Take 1 tablet (10 mg total) by mouth daily at 6 PM.   DONEPEZIL (ARICEPT) 10 MG TABLET    Take 10 mg by mouth at bedtime.    FEEDING SUPPLEMENT, ENSURE ENLIVE, (ENSURE ENLIVE) LIQD    Take 237 mLs by mouth 2 (two) times daily between meals.   LATANOPROST (XALATAN) 0.005 % OPHTHALMIC SOLUTION    Place 1 drop into both eyes at bedtime.   LEVALBUTEROL (XOPENEX) 0.63 MG/3ML NEBULIZER SOLUTION    Take 3 mLs (0.63 mg total) by nebulization every 6 (six) hours as needed for wheezing or shortness of breath.   RIVAROXABAN (XARELTO) 20 MG TABS TABLET    Take 1 tablet (20 mg total) by mouth daily with supper.   SENNA (SENOKOT) 8.6 MG TABS TABLET    Take 1 tablet by mouth every morning.   SERTRALINE (ZOLOFT) 50 MG TABLET    Take 50 mg by mouth every morning.    TAMSULOSIN (FLOMAX) 0.4 MG CAPS CAPSULE    Take 1 capsule (0.4 mg total) by mouth daily.    Modified Medications   No medications on file  Discontinued Medications   No medications on file     SIGNIFICANT DIAGNOSTIC EXAMS  09-08-15: KUB: No evidence of bowel obstruction. No acute fracture noted.  09-08-15: chest x-ray: No acute cardiopulmonary disease. COPD.  09-08-15: ct of head and cervical spine: 1. No evidence of acute intracranial or cervical spine injury. 2. Extensive chronic ischemic injury as described, stable from 12/21/2014 comparison.  09-09-15: EEG: Clinical Interpretation: This essentially normal EEG is recorded in the waking and sleep state. There was no seizure or seizure predisposition recorded on this study. Please note that a normal EEG does not preclude the possibility of epilepsy.  6-16 ct of head: No acute abnormality. Extensive atrophy and multiple remote infarctions as seen on prior exams  09-30-15: chest x-ray: No acute cardiopulmonary abnormality seen.  09-30-15: swallow study: Regular solids;Thin liquid   10-05-15: chest x-ray: Retrocardiac density may represent atelectasis versus infiltrate.  10-06-15: right upper quad ultrasound: 1.  Small gallstone.  No biliary distention. 2. Liver is slightly echogenic suggesting fatty infiltration .  10-06-15: 2-d echo:   - Left ventricle: The cavity size was normal. Systolic function was moderately to severely reduced. The estimated ejection fraction was in the range of 30% to 35%. Diffuse hypokinesis. Doppler parameters are consistent with abnormal left ventricular relaxation (grade 1 diastolic dysfunction). There was no evidence of elevated ventricular filling pressure by Doppler parameters. - Aortic valve: There was mild regurgitation. - Aortic root: The aortic root was normal in size. - Mitral valve: Structurally normal valve. There was mild regurgitation. - Left atrium: The atrium was mildly dilated. - Right ventricle: Pacer wire or catheter noted in right ventricle. Systolic function was normal. - Right  atrium: The atrium was moderately dilated. Pacer wire or catheter noted in right atrium. - Tricuspid valve: There was mild regurgitation. - Pulmonary arteries: Systolic pressure was mildly increased. PA peak pressure: 39 mm Hg (S). - Inferior vena cava: The vessel was dilated. The respirophasic diameter changes were blunted (< 50%), consistent with elevated central venous pressure. - Pericardium, extracardiac: There was no pericardial effusion. Impressions: - When compared to the prior study from 05/31/14 the LVEF has improved, now 30-35% with diffuse  hypokinesis.  10-11-15: chest x-ray: right lower lobe atelectasis; no active TB.     LABS REVIEWED:   03-29-15: wbc 4.9; hgb 9.7; hct 32.2; mcv 78.0; plt 247; glucose 97; bun 12; creat 1.11; k+ 4.0; na++141  09-08-15: wbc 7.2; hgb 7.3; hct 26.5; mcv 69.6; plt clump; glucose 218; bun 15; creat 1.31; k+ 4.0; na++ 137; liver normal albumin 3.0; mag 1.7; hgb a1c 6.8; tsh 1.135 09-10-15: wbc 9.8; hgb 9.0; hxct 31.2. mcv 71.6; plt 179; glucose 75; bun 14; creat 1.16; k+ 4.1; na++ 139 09-11-15: wbc 10.4; hg 8.7; hct 30.1; mcv 72.5; plt 208  09-15-15: wbc 9.2; hgb 9.8; hct 35.6; mcv 72.0; plt 251; glucose 133; bun 18.1; creat 1.04; k+ 4.6; na++ 140; liver normal albumin 3.0; chol 135; ldl 37; tri 82; hdl 81; hgb a1c 5.5 PSA 3.60  10-08-15: wbc 13.7; hgb 9.9; hct 33.3; mcv 74.0; plt 375; glucose 113; bun 23; creat 1.20; k+ 4.6; na++ 136      Review of Systems  Unable to perform ROS: dementia    Physical Exam  Constitutional: No distress.  Frail   Eyes: Conjunctivae are normal.  Neck: Neck supple. No JVD present. No thyromegaly present.  Cardiovascular: Normal rate, regular rhythm and intact distal pulses.   Respiratory: Effort normal and breath sounds normal. No respiratory distress. He has no wheezes.  GI: Soft. Bowel sounds are normal. He exhibits no distension. There is no tenderness.  Musculoskeletal: He exhibits no edema.  Left hemiparesis    Lymphadenopathy:    He has no cervical adenopathy.  Neurological: He is alert.  Skin: Skin is warm and dry. He is not diaphoretic.  Psychiatric: He has a normal mood and affect.      ASSESSMENT/ PLAN:   1.  Bilateral lower extremity pain: will continue tylenol 1 gm  daily for pain and will monitor his status   2. Dyslipidemia: will continue lipitor 10 mg daily ldl is 37   3. Dementia: without significant change; his current weight is 124 pounds is presently stable; will continue aricept 10 mg nightly   4. BPH: will continue flomax 0.4 mg daily   5. Chronic diastoic heart failure: EF 30-35% (10-06-15) is status post ICD placement   is currently not on medications; will not make changes will monitor  6. PE: is stable will continue xarelto 20 mg daily   7. CVA: is neurologically stable will continue xarelto 20 mg daily   8. Depression: will continue zoloft 50 mg daily and takes xanax 0.5 mg nightly for anxiety  As needed   9. CAD: no indications of chest pain present; will continue asa 81 mg daily  10. Glaucoma: will continue xalatan to both eyes.   11. Constipation: will continue senna daily   12. COPD: will not make changes will monitor his status.      MD is aware of resident's narcotic use and is in agreement with current plan of care. We will attempt to wean resident as apropriate   Ok Edwards NP [facility]  Contact [phone] Monday through Friday 8am- 5pm  After hours call [phone]

## 2016-01-01 IMAGING — CR DG KNEE 1-2V*R*
2 series · 2 of 2 positions shown · non-contrast
Comparison: None.

CLINICAL DATA: Leg pain.

EXAM:
RIGHT KNEE - 1-2 VIEW

[x knee ap right]
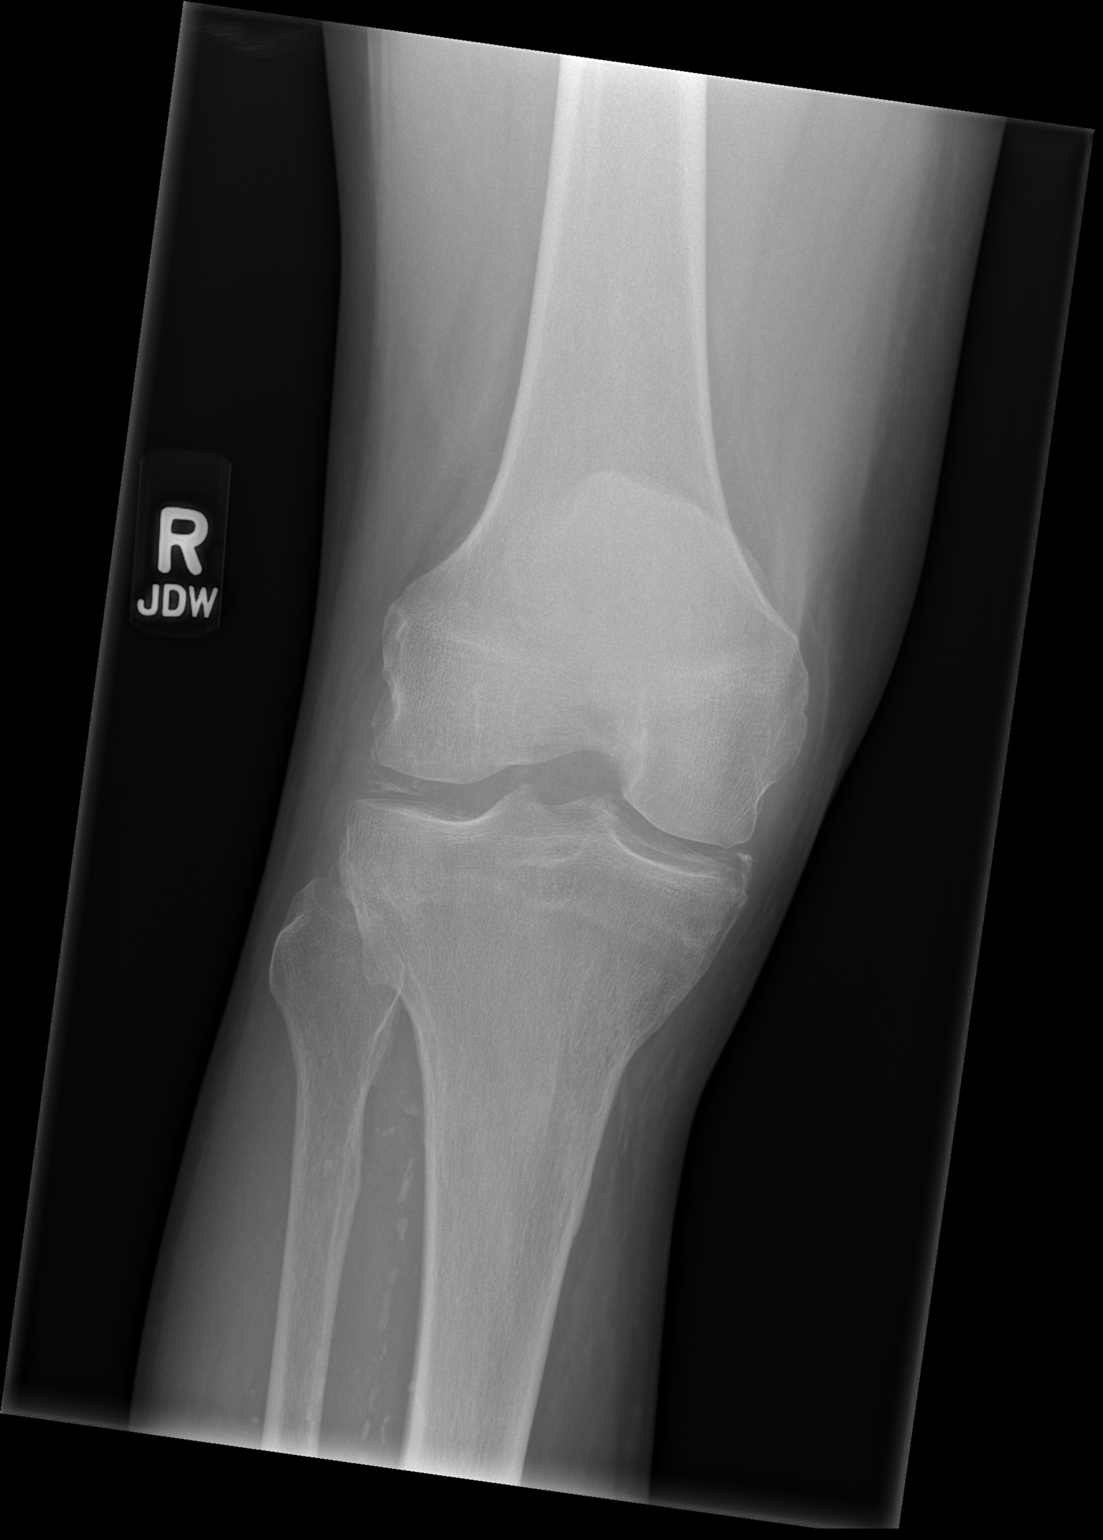

[x knee lat right]
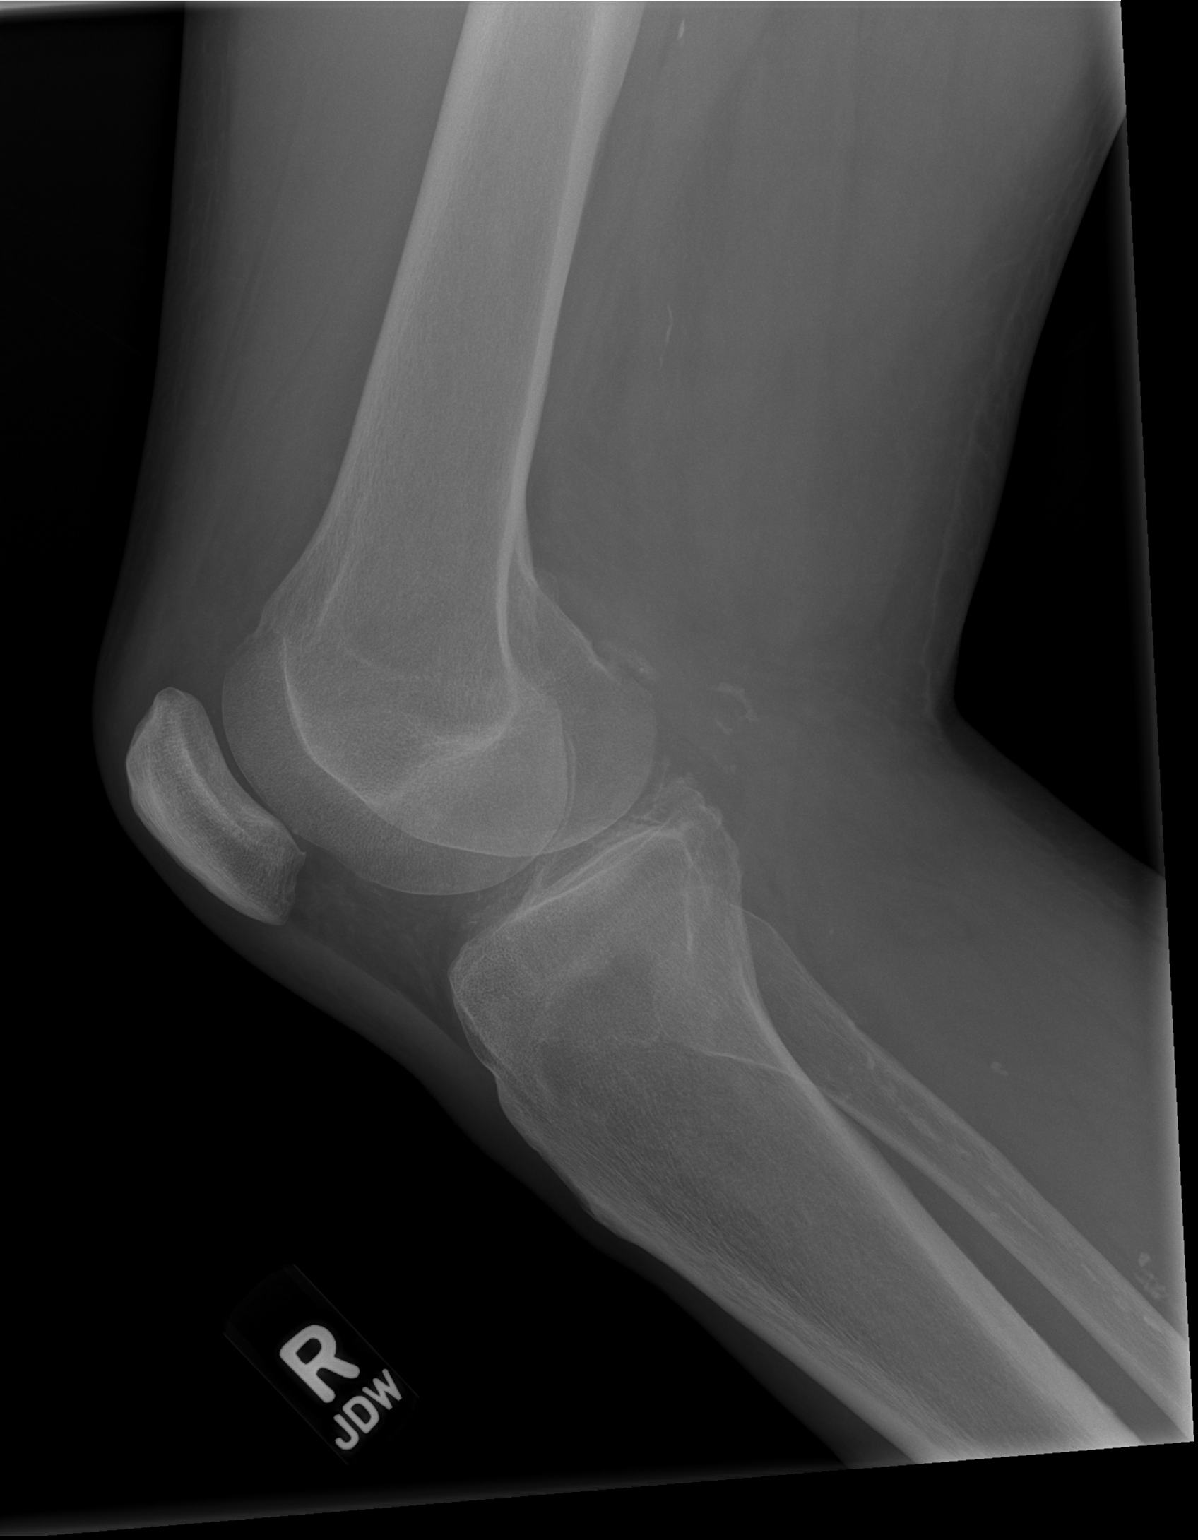

[2 of 2 positions shown; findings below may reference images not displayed]

FINDINGS: The mineralization and alignment are normal. There is no evidence of
acute fracture or dislocation. The joint spaces are maintained.
There is meniscal chondrocalcinosis both medially and laterally. No
significant joint effusion is seen. There are scattered vascular
calcifications.
IMPRESSION: No acute osseous findings or malalignment. Meniscal
chondrocalcinosis.

## 2016-01-01 IMAGING — CR DG KNEE 1-2V*L*
2 series · 2 of 2 positions shown · non-contrast
Comparison: None.

CLINICAL DATA: Bilateral knee pain for 1 year, worsening over the
last week. Bilateral knee swelling. No known injury.

EXAM:
LEFT KNEE - 1-2 VIEW

[x knee ap left]
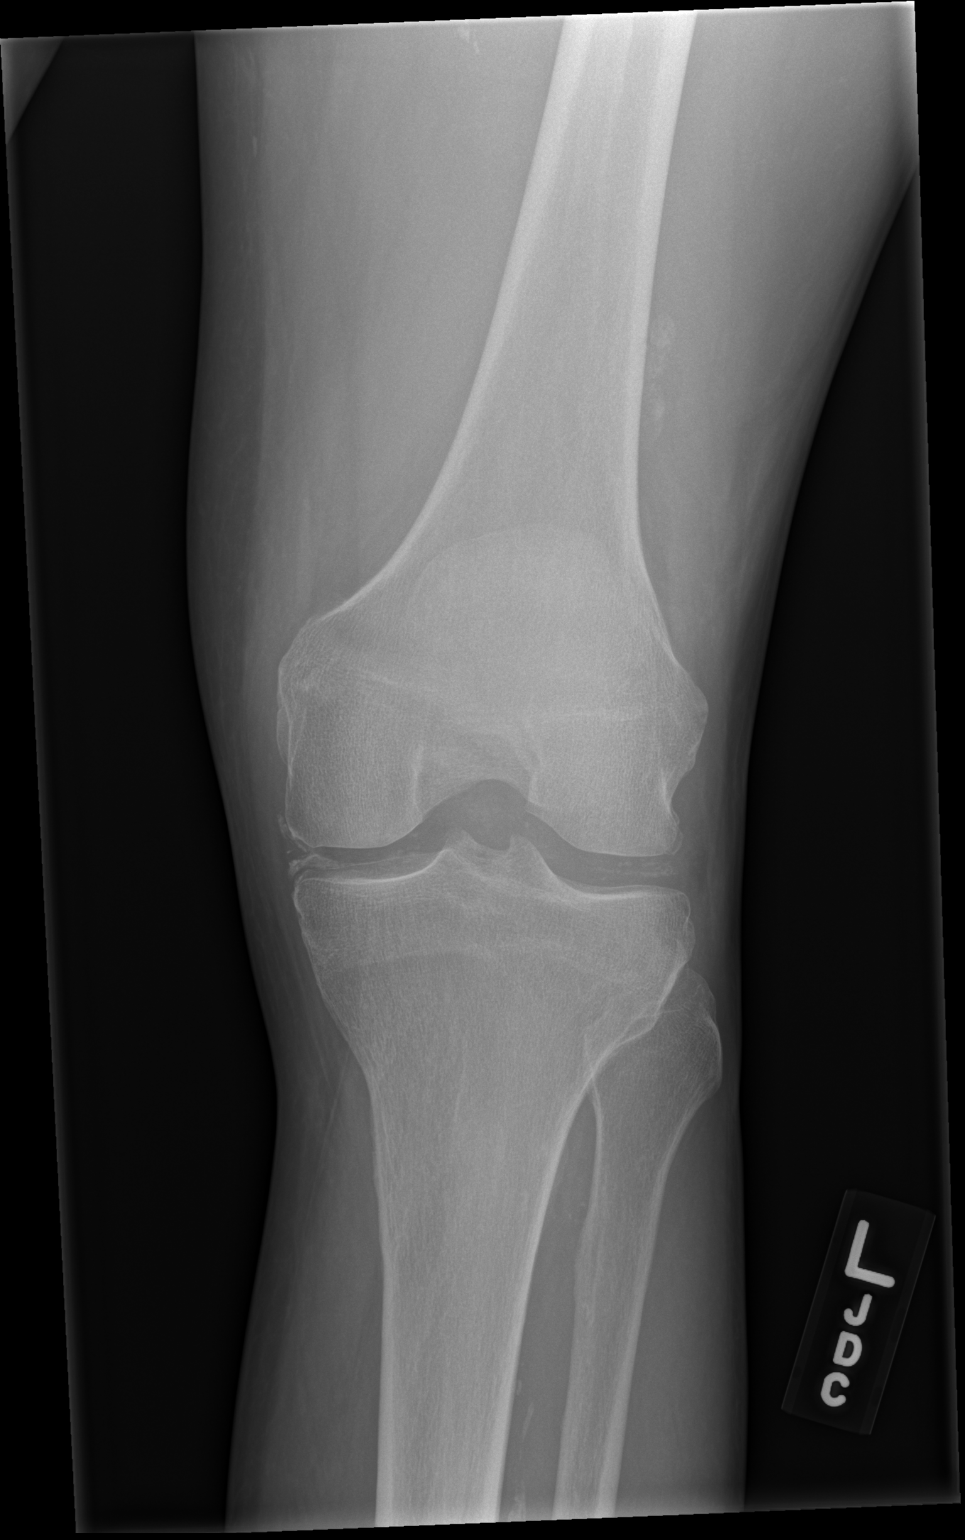

[x knee lat left]
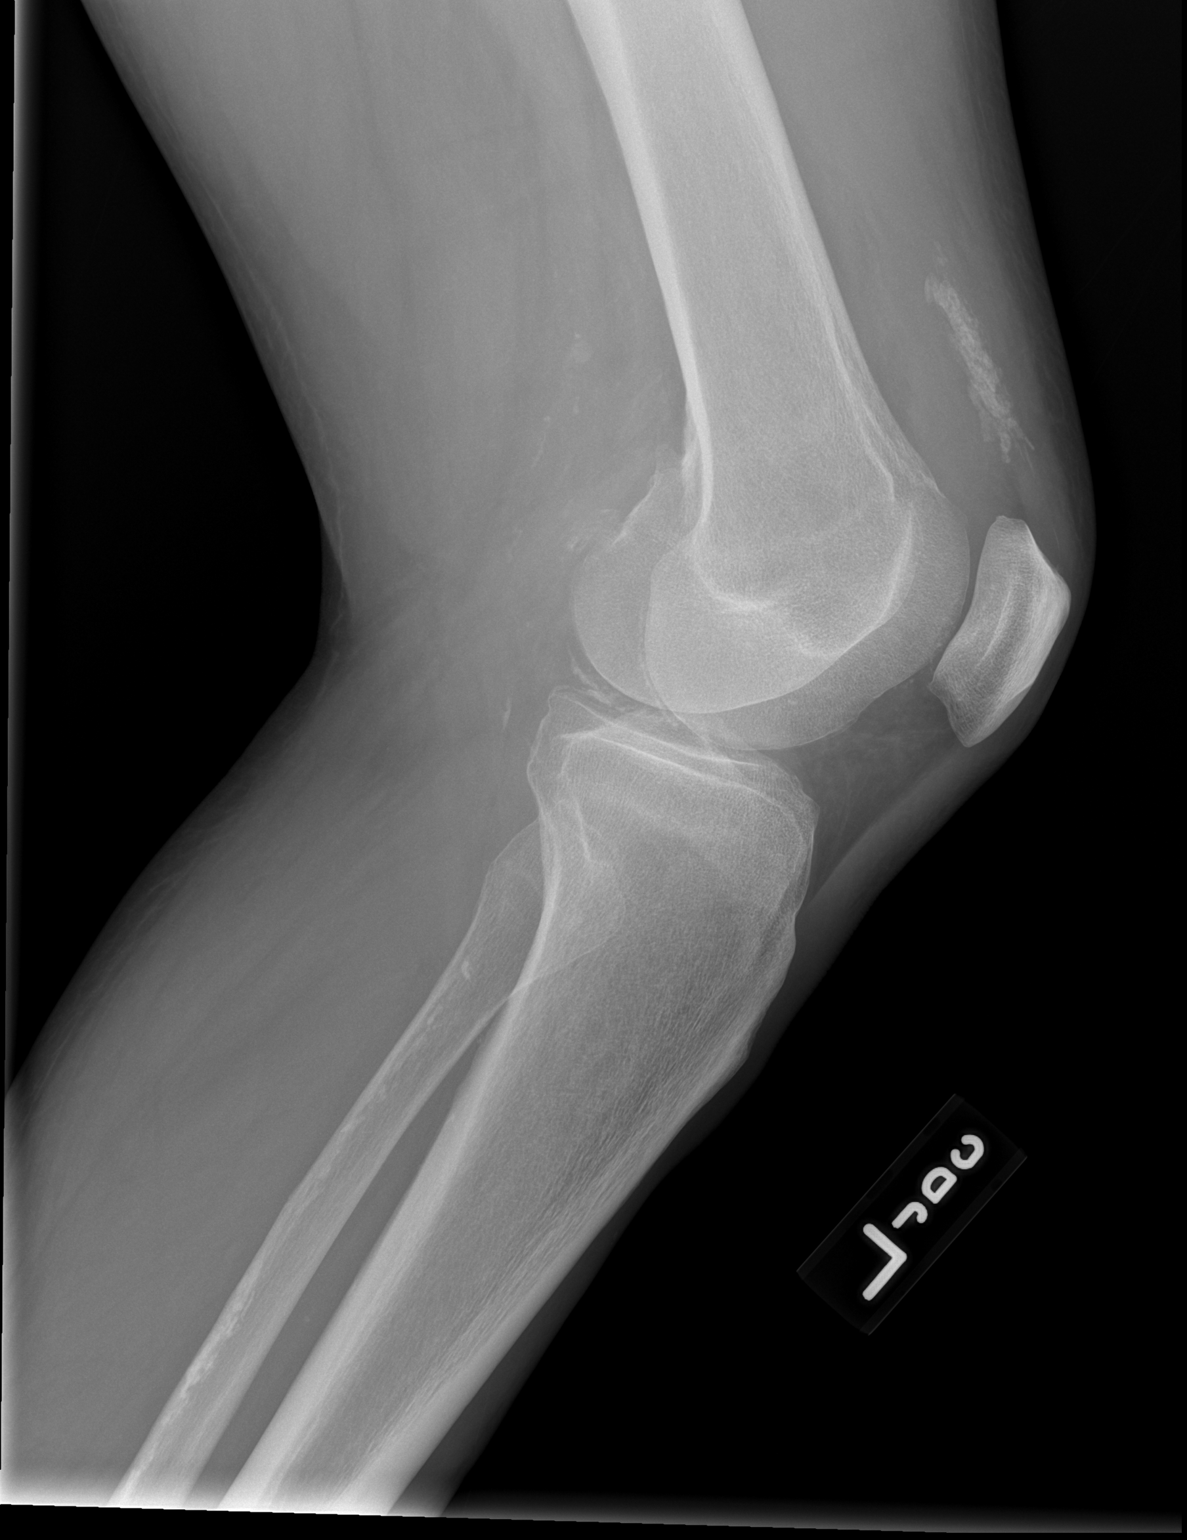

[2 of 2 positions shown; findings below may reference images not displayed]

FINDINGS: The mineralization and alignment are normal. There is no evidence of
acute fracture or dislocation. The joint spaces are maintained.
There is meniscal chondrocalcinosis both medially and laterally.
There is also soft tissue calcification superior to the patella,
best seen on the lateral view and possibly associated with the
quadriceps tendon. There is a small knee joint effusion. The
patellar tendon appears normal. There are scattered vascular
calcifications.
IMPRESSION: 1. No acute osseous findings.
2. Meniscal chondrocalcinosis.
3. Calcification anterior to the distal femur may be within the
quadriceps tendon and related to tendon degeneration or prior
injury. The extensor mechanism appears grossly intact.

## 2016-01-12 ENCOUNTER — Non-Acute Institutional Stay (SKILLED_NURSING_FACILITY): Payer: Medicare Other | Admitting: Adult Health

## 2016-01-12 ENCOUNTER — Encounter: Payer: Self-pay | Admitting: Adult Health

## 2016-01-12 DIAGNOSIS — N183 Chronic kidney disease, stage 3 unspecified: Secondary | ICD-10-CM

## 2016-01-12 DIAGNOSIS — E782 Mixed hyperlipidemia: Secondary | ICD-10-CM

## 2016-01-12 DIAGNOSIS — I6340 Cerebral infarction due to embolism of unspecified cerebral artery: Secondary | ICD-10-CM | POA: Diagnosis not present

## 2016-01-12 DIAGNOSIS — F0280 Dementia in other diseases classified elsewhere without behavioral disturbance: Secondary | ICD-10-CM | POA: Diagnosis not present

## 2016-01-12 DIAGNOSIS — F0150 Vascular dementia without behavioral disturbance: Secondary | ICD-10-CM | POA: Diagnosis not present

## 2016-01-12 DIAGNOSIS — I5022 Chronic systolic (congestive) heart failure: Secondary | ICD-10-CM | POA: Diagnosis not present

## 2016-01-12 DIAGNOSIS — I110 Hypertensive heart disease with heart failure: Secondary | ICD-10-CM | POA: Diagnosis not present

## 2016-01-12 DIAGNOSIS — F329 Major depressive disorder, single episode, unspecified: Secondary | ICD-10-CM | POA: Diagnosis not present

## 2016-01-12 DIAGNOSIS — I2782 Chronic pulmonary embolism: Secondary | ICD-10-CM | POA: Diagnosis not present

## 2016-01-12 DIAGNOSIS — J449 Chronic obstructive pulmonary disease, unspecified: Secondary | ICD-10-CM | POA: Diagnosis not present

## 2016-01-12 DIAGNOSIS — N400 Enlarged prostate without lower urinary tract symptoms: Secondary | ICD-10-CM | POA: Diagnosis not present

## 2016-01-12 DIAGNOSIS — F0393 Unspecified dementia, unspecified severity, with mood disturbance: Secondary | ICD-10-CM

## 2016-01-12 NOTE — Progress Notes (Signed)
Patient ID: Grant Lara, male   DOB: 14-Jun-1940, 75 y.o.   MRN: [id number]    Location:   [place] Room Number: 107-B Place of Service:  SNF (31)   CODE STATUS: Full Code  No Known Allergies  Chief Complaint  Patient presents with  . Medical Management of Chronic Issues    Follow up    HPI:  He is a long term resident of this facility being seen for the management of his chronic illnesses. Overall tere is little change in his status. He is unable to fully participate in the hpi or ros. There are no nursing concerns at this time.    Past Medical History:  Diagnosis Date  . AICD (automatic cardioverter/defibrillator) present   . Arthritis    "used to have a touch in my legs"  . Bradycardia 06/01/2014  . CAD (coronary artery disease)    LHC 9/05 with Dr. Einar Gip:  dLM 20-30%, LAD 85%, oD1 20-30%.  PCI:  Taxus DES to LAD; Dx jailed and tx with POBA.  Last myoview 12/10: inf scar, no ischemia, EF 29%.  . CHF (congestive heart failure) ([place])   . Crohn's disease ([place])   . Dementia   . Diabetes mellitus    11/05/11 "borderline; don't take medications"  . Dilated cardiomyopathy ([place])    2/12: EF 30-35%, trivial AI, mild RAE.  EF 2014 40 -45%  . DVT (deep venous thrombosis) ([place])   . HCAP (healthcare-associated pneumonia) 09/29/2015  . HLD (hyperlipidemia)   . 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 Hyperlipidemia   . Hypertension   . Memory difficulties   . Nephrolithiasis   . PFO (patent foramen ovale)    Not mentioned on 2014 echo.  . Pneumonia ~ 2011   09-22-13 denies any recent SOB or breathing problems  . Pulmonary embolus (HCC)    chronic coumadin  . Stroke [facility]) 1993   "left arm can't hold steady; leg too"  . Swelling of both ankles     09-22-13 occ.feet, but denies pain.    Past Surgical History:  Procedure Laterality Date  . APPENDECTOMY    . COLON SURGERY  1994; 1996   "for Crohn's disease"  . IMPLANTABLE CARDIOVERTER DEFIBRILLATOR IMPLANT N/A 06/02/2014   Procedure: IMPLANTABLE CARDIOVERTER DEFIBRILLATOR IMPLANT;  Surgeon: [name], MD;  Location: [facility] CATH LAB;  Service: Cardiovascular;  Laterality: N/A;    Social History   Social History  . Marital status: Married    Spouse name: N/A  . Number of children: N/A  . Years of education: N/A   Occupational History  . Not on file.   Social History Main Topics  . Smoking status: Current Some Day Smoker    Packs/day: 0.50    Years: 53.00    Types: Cigarettes  . Smokeless tobacco: Never Used  . Alcohol use No  . Drug use: No  . Sexual activity: Not Currently   Other Topics Concern  . Not on file   Social History Narrative  . No narrative on file   Family History  Problem Relation Age of Onset  . Diabetes type II Mother   . CAD Father   . Diabetes type II Brother       VITAL SIGNS BP 133/70   Pulse 73   Temp 98 F (36.7 C) (Oral)   Resp 16   Ht 5' 3"  (1.6 m)   Wt 122 lb 9 oz (55.6 kg)   SpO2 97%   BMI  21.71 kg/m   Patient's Medications  New Prescriptions   No medications on file  Previous Medications   ACETAMINOPHEN (TYLENOL) 500 MG TABLET    Take 1,000 mg by mouth daily.    ALPRAZOLAM (XANAX) 0.5 MG TABLET    Take 1 tablet (0.5 mg total) by mouth at bedtime as needed.   ASPIRIN 81 MG TABLET    Take 81 mg by mouth daily.   ATORVASTATIN (LIPITOR) 10 MG TABLET    Take 1 tablet (10 mg total) by mouth daily at 6 PM.   DONEPEZIL (ARICEPT) 10 MG TABLET    Take 10 mg by mouth at bedtime.    FEEDING SUPPLEMENT, ENSURE ENLIVE, (ENSURE ENLIVE) LIQD    Take 237 mLs by mouth 2 (two) times daily between meals.   LATANOPROST (XALATAN) 0.005 % OPHTHALMIC SOLUTION    Place 1 drop into both eyes at bedtime.   LEVALBUTEROL (XOPENEX) 0.63 MG/3ML NEBULIZER SOLUTION    Take 3 mLs (0.63 mg total) by nebulization every 6 (six) hours as needed for wheezing or shortness of breath.   RIVAROXABAN (XARELTO) 20 MG TABS TABLET    Take 1 tablet (20 mg total) by mouth daily with supper.    SENNA (SENOKOT) 8.6 MG TABS TABLET    Take 1 tablet by mouth every morning.   SERTRALINE (ZOLOFT) 50 MG TABLET    Take 50 mg by mouth every morning.    TAMSULOSIN (FLOMAX) 0.4 MG CAPS CAPSULE    Take 1 capsule (0.4 mg total) by mouth daily.  Modified Medications   No medications on file  Discontinued Medications   No medications on file     SIGNIFICANT DIAGNOSTIC EXAMS  09-08-15: KUB: No evidence of bowel obstruction. No acute fracture noted.  09-08-15: chest x-ray: No acute cardiopulmonary disease. COPD.  09-08-15: ct of head and cervical spine: 1. No evidence of acute intracranial or cervical spine injury. 2. Extensive chronic ischemic injury as described, stable from 12/21/2014 comparison.  09-09-15: EEG: Clinical Interpretation: This essentially normal EEG is recorded in the waking and sleep state. There was no seizure or seizure predisposition recorded on this study. Please note that a normal EEG does not preclude the possibility of epilepsy.  6-16 ct of head: No acute abnormality. Extensive atrophy and multiple remote infarctions as seen on prior exams  09-30-15: chest x-ray: No acute cardiopulmonary abnormality seen.  09-30-15: swallow study: Regular solids;Thin liquid   10-05-15: chest x-ray: Retrocardiac density may represent atelectasis versus infiltrate.  10-06-15: right upper quad ultrasound: 1.  Small gallstone.  No biliary distention. 2. Liver is slightly echogenic suggesting fatty infiltration .  10-06-15: 2-d echo:   - Left ventricle: The cavity size was normal. Systolic function was moderately to severely reduced. The estimated ejection fraction was in the range of 30% to 35%. Diffuse hypokinesis. Doppler parameters are consistent with abnormal left ventricular relaxation (grade 1 diastolic dysfunction). There was no evidence of elevated ventricular filling pressure by Doppler parameters. - Aortic valve: There was mild regurgitation. - Aortic root: The aortic root was  normal in size. - Mitral valve: Structurally normal valve. There was mild regurgitation. - Left atrium: The atrium was mildly dilated. - Right ventricle: Pacer wire or catheter noted in right ventricle. Systolic function was normal. - Right atrium: The atrium was moderately dilated. Pacer wire or catheter noted in right atrium. - Tricuspid valve: There was mild regurgitation. - Pulmonary arteries: Systolic pressure was mildly increased. PA peak pressure: 39 mm Hg (S). -  Inferior vena cava: The vessel was dilated. The respirophasic diameter changes were blunted (< 50%), consistent with elevated central venous pressure. - Pericardium, extracardiac: There was no pericardial effusion. Impressions: - When compared to the prior study from 05/31/14 the LVEF has improved, now 30-35% with diffuse hypokinesis.  10-11-15: chest x-ray: right lower lobe atelectasis; no active TB.   11-16-15: left hip and pelvis: no acute osseous abnormalities.     LABS REVIEWED:   03-29-15: wbc 4.9; hgb 9.7; hct 32.2; mcv 78.0; plt 247; glucose 97; bun 12; creat 1.11; k+ 4.0; na++141  09-08-15: wbc 7.2; hgb 7.3; hct 26.5; mcv 69.6; plt clump; glucose 218; bun 15; creat 1.31; k+ 4.0; na++ 137; liver normal albumin 3.0; mag 1.7; hgb a1c 6.8; tsh 1.135 09-10-15: wbc 9.8; hgb 9.0; hxct 31.2. mcv 71.6; plt 179; glucose 75; bun 14; creat 1.16; k+ 4.1; na++ 139 09-11-15: wbc 10.4; hg 8.7; hct 30.1; mcv 72.5; plt 208  09-15-15: wbc 9.2; hgb 9.8; hct 35.6; mcv 72.0; plt 251; glucose 133; bun 18.1; creat 1.04; k+ 4.6; na++ 140; liver normal albumin 3.0; chol 135; ldl 37; tri 82; hdl 81; hgb a1c 5.5 PSA 3.60  10-08-15: wbc 13.7; hgb 9.9; hct 33.3; mcv 74.0; plt 375; glucose 113; bun 23; creat 1.20; k+ 4.6; na++ 136      Review of Systems  Unable to perform ROS: dementia    Physical Exam  Constitutional: No distress.  Frail   Eyes: Conjunctivae are normal.  Neck: Neck supple. No JVD present. No thyromegaly present.    Cardiovascular: Normal rate, regular rhythm and intact distal pulses.   Respiratory: Effort normal and breath sounds normal. No respiratory distress. He has no wheezes.  GI: Soft. Bowel sounds are normal. He exhibits no distension. There is no tenderness.  Musculoskeletal: He exhibits no edema.  Left hemiparesis  Lymphadenopathy:    He has no cervical adenopathy.  Neurological: He is alert.  Skin: Skin is warm and dry. He is not diaphoretic.  Psychiatric: He has a normal mood and affect.      ASSESSMENT/ PLAN:   1.  Bilateral lower extremity pain: will continue tylenol 1 gm  daily for pain and will monitor his status   2. Dyslipidemia: will continue lipitor 10 mg daily ldl is 37   3. Dementia: without significant change; his current weight is 124 pounds is presently stable; will continue aricept 10 mg nightly   4. BPH: will continue flomax 0.4 mg daily   5. Chronic diastoic heart failure: EF 30-35% (10-06-15) is status post ICD placement   is currently not on medications; will not make changes will monitor  6. PE: is stable will continue xarelto 20 mg daily   7. CVA: is neurologically stable will continue xarelto 20 mg daily   8. Depression: will continue zoloft 50 mg daily and takes xanax 0.5 mg nightly for anxiety  As needed   9. CAD: no indications of chest pain present; will continue asa 81 mg daily  10. Glaucoma: will continue xalatan to both eyes.   11. Constipation: will continue senna daily   12. COPD: will not make changes will monitor his status.   13. CKD stage III: bun/creat 23/1.20    MD is aware of resident's narcotic use and is in agreement with current plan of care. We will attempt to wean resident as appropriate.     Ok Edwards NP [facility]  Contact [phone] Monday through Friday 8am- 5pm  After hours  call [phone]

## 2016-01-30 ENCOUNTER — Non-Acute Institutional Stay (SKILLED_NURSING_FACILITY): Payer: Medicare Other | Admitting: Internal Medicine

## 2016-01-30 DIAGNOSIS — M25579 Pain in unspecified ankle and joints of unspecified foot: Secondary | ICD-10-CM

## 2016-01-30 NOTE — Progress Notes (Signed)
This is an acute visit.    Level care skilled.  Facility is Psychologist, sport and exercise complaint-bilateral foot pain.   History of present illness.  Patient is a 75 year old male who apparently has been complaining to nursing staff of bilateral foot pain.  Per chart review it appears he does have a history of bilateral lower extremity pain he is on Tylenol thousand milligrams once a day.  There has been no documented history of trauma  Patient is a poor historian  secondary to dementia states the pain has been there for years-possibly has worsened recently.  The pain is somewhat difficult to localize since patient is a poor historian but appears to be more in his feet possibly toes although again this is difficult to fully tell    .  Past Medical History:  Diagnosis Date  . AICD (automatic cardioverter/defibrillator) present   . Arthritis    "used to have a touch in my legs"  . Bradycardia 06/01/2014  . CAD (coronary artery disease)    LHC 9/05 with Dr. Einar Gip:  dLM 20-30%, LAD 85%, oD1 20-30%.  PCI:  Taxus DES to LAD; Dx jailed and tx with POBA.  Last myoview 12/10: inf scar, no ischemia, EF 29%.  . CHF (congestive heart failure) ([place])   . Crohn's disease ([place])   . Dementia   . Diabetes mellitus    11/05/11 "borderline; don't take medications"  . Dilated cardiomyopathy ([place])    2/12: EF 30-35%, trivial AI, mild RAE.  EF 2014 40 -45%  . DVT (deep venous thrombosis) ([place])   . HCAP (healthcare-associated pneumonia) 09/29/2015  . HLD (hyperlipidemia)   . 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 Hyperlipidemia   . Hypertension   . Memory difficulties   . Nephrolithiasis   . PFO (patent foramen ovale)    Not mentioned on 2014 echo.  . Pneumonia ~ 2011   09-22-13 denies any recent SOB or breathing problems  . Pulmonary embolus (HCC)    chronic coumadin  . Stroke [facility]) 1993   "left arm can't hold steady; leg too"  . Swelling of both ankles      09-22-13 occ.feet, but denies pain.         Past Surgical History:  Procedure Laterality Date  . APPENDECTOMY    . COLON SURGERY  1994; 1996   "for Crohn's disease"  . IMPLANTABLE CARDIOVERTER DEFIBRILLATOR IMPLANT N/A 06/02/2014   Procedure: IMPLANTABLE CARDIOVERTER DEFIBRILLATOR IMPLANT;  Surgeon: [name], MD;  Location: [facility] CATH LAB;  Service: Cardiovascular;  Laterality: N/A;    Social History        Social History  . Marital status: Married    Spouse name: N/A  . Number of children: N/A  . Years of education: N/A      Occupational History  . Not on file.        Social History Main Topics  . Smoking status: Current Some Day Smoker    Packs/day: 0.50    Years: 53.00    Types: Cigarettes  . Smokeless tobacco: Never Used  . Alcohol use No  . Drug use: No  . Sexual activity: Not Currently       Other Topics Concern  . Not on file      Social History Narrative  . No narrative on file        Family History  Problem Relation Age of Onset  . Diabetes type II Mother   . CAD Father   .  Diabetes type II Brother       VITAL SIGNS   Temperature 98.1 pulse 64 respirations 18 blood pressure 133/82        Patient's Medications  New Prescriptions   No medications on file  Previous Medications   ACETAMINOPHEN (TYLENOL) 500 MG TABLET    Take 1,000 mg by mouth daily.    ALPRAZOLAM (XANAX) 0.5 MG TABLET    Take 1 tablet (0.5 mg total) by mouth at bedtime as needed.   ASPIRIN 81 MG TABLET    Take 81 mg by mouth daily.   ATORVASTATIN (LIPITOR) 10 MG TABLET    Take 1 tablet (10 mg total) by mouth daily at 6 PM.   DONEPEZIL (ARICEPT) 10 MG TABLET    Take 10 mg by mouth at bedtime.    FEEDING SUPPLEMENT, ENSURE ENLIVE, (ENSURE ENLIVE) LIQD    Take 237 mLs by mouth 2 (two) times daily between meals.   LATANOPROST (XALATAN) 0.005 % OPHTHALMIC SOLUTION    Place 1 drop into both eyes at bedtime.   LEVALBUTEROL (XOPENEX) 0.63  MG/3ML NEBULIZER SOLUTION    Take 3 mLs (0.63 mg total) by nebulization every 6 (six) hours as needed for wheezing or shortness of breath.   RIVAROXABAN (XARELTO) 20 MG TABS TABLET    Take 1 tablet (20 mg total) by mouth daily with supper.   SENNA (SENOKOT) 8.6 MG TABS TABLET    Take 1 tablet by mouth every morning.   SERTRALINE (ZOLOFT) 50 MG TABLET    Take 50 mg by mouth every morning.    TAMSULOSIN (FLOMAX) 0.4 MG CAPS CAPSULE    Take 1 capsule (0.4 mg total) by mouth daily.  Modified Medications   No medications on file  Discontinued Medications   No medications on file     SIGNIFICANT DIAGNOSTIC EXAMS  09-08-15: KUB: No evidence of bowel obstruction. No acute fracture noted.  09-08-15: chest x-ray: No acute cardiopulmonary disease. COPD.  09-08-15: ct of head and cervical spine: 1. No evidence of acute intracranial or cervical spine injury. 2. Extensive chronic ischemic injury as described, stable from 12/21/2014 comparison.  09-09-15: EEG: Clinical Interpretation: This essentially normal EEG is recorded in the waking and sleep state. There was no seizure or seizure predisposition recorded on this study. Please note that a normal EEG does not preclude the possibility of epilepsy.  6-16 ct of head: No acute abnormality. Extensive atrophy and multiple remote infarctions as seen on prior exams  09-30-15: chest x-ray: No acute cardiopulmonary abnormality seen.  09-30-15: swallow study: Regular solids;Thin liquid  10-05-15: chest x-ray: Retrocardiac density may represent atelectasis versus infiltrate.  10-06-15: right upper quad ultrasound: 1. Small gallstone. No biliary distention. 2. Liver is slightly echogenic suggesting fatty infiltration .  10-06-15: 2-d echo:  - Left ventricle: The cavity size was normal. Systolic function was moderately to severely reduced. The estimated ejection fraction was in the range of 30% to 35%. Diffuse hypokinesis. Doppler parameters  are consistent with abnormal left ventricular relaxation (grade 1 diastolic dysfunction). There was no evidence of elevated ventricular filling pressure by Doppler parameters. - Aortic valve: There was mild regurgitation. - Aortic root: The aortic root was normal in size. - Mitral valve: Structurally normal valve. There was mild regurgitation. - Left atrium: The atrium was mildly dilated. - Right ventricle: Pacer wire or catheter noted in right ventricle. Systolic function was normal. - Right atrium: The atrium was moderately dilated. Pacer wire or catheter noted in right atrium. -  Tricuspid valve: There was mild regurgitation. - Pulmonary arteries: Systolic pressure was mildly increased. PA peak pressure: 39 mm Hg (S). - Inferior vena cava: The vessel was dilated. The respirophasic diameter changes were blunted (< 50%), consistent with elevated central venous pressure. - Pericardium, extracardiac: There was no pericardial effusion. Impressions: - When compared to the prior study from 05/31/14 the LVEF has improved, now 30-35% with diffuse hypokinesis.  10-11-15: chest x-ray: right lower lobe atelectasis; no active TB.   11-16-15: left hip and pelvis: no acute osseous abnormalities.     LABS REVIEWED:   03-29-15: wbc 4.9; hgb 9.7; hct 32.2; mcv 78.0; plt 247; glucose 97; bun 12; creat 1.11; k+ 4.0; na++141  09-08-15: wbc 7.2; hgb 7.3; hct 26.5; mcv 69.6; plt clump; glucose 218; bun 15; creat 1.31; k+ 4.0; na++ 137; liver normal albumin 3.0; mag 1.7; hgb a1c 6.8; tsh 1.135 09-10-15: wbc 9.8; hgb 9.0; hxct 31.2. mcv 71.6; plt 179; glucose 75; bun 14; creat 1.16; k+ 4.1; na++ 139 09-11-15: wbc 10.4; hg 8.7; hct 30.1; mcv 72.5; plt 208  09-15-15: wbc 9.2; hgb 9.8; hct 35.6; mcv 72.0; plt 251; glucose 133; bun 18.1; creat 1.04; k+ 4.6; na++ 140; liver normal albumin 3.0; chol 135; ldl 37; tri 82; hdl 81; hgb a1c 5.5 PSA 3.60  10-08-15: wbc 13.7; hgb 9.9; hct 33.3; mcv 74.0; plt 375; glucose 113;  bun 23; creat 1.20; k+ 4.6; na++ 136      Review of Systems  Unable to perform ROS: dementia  Please see history of present illness   Physical Exam  Constitutional: No distress. Lying comfortably in bed  Frail   Eyes: Conjunctivae are normal.     Cardiovascular: Normal rate, regular rhythm and intact distal pulses.   Respiratory: Effort normal and breath sounds normal. No respiratory distress. He has no wheezes.  GI: Soft. Bowel sounds are normal. He exhibits no distension. There is no tenderness.  Musculoskeletal: He exhibits no edema.  Left hemiparesis  Has age-related arthritic changes of his knees bilaterally-foot exam was quite benign and did not note any deformity swelling erythema or drainage from his toes.  There is some diffuse tenderness to palpation of the feet although this is very difficult to localize on either foot  Pedal pulses bilaterally are somewhat reduced.     Neurological: He is alert.  Skin: Skin is warm and dry. He is not diaphoretic.  Psychiatric: He has a normal mood and affect.   Assessment and plan  Bilateral foot pain which apparently is getting somewhat worse although again history is somewhat sketchy-he does not really complain of foot pain to his nurse today however nursing has left a note to this effect.  He is on Tylenol thousand milligrams daily--- will increase to twice a day routine dosing to see if this will give some relief-continue to monitor for any changes here if pain persists suspect we will be more aggressive-- but exam today again was fairly benign  [phone]

## 2016-02-13 ENCOUNTER — Encounter: Payer: Self-pay | Admitting: Adult Health

## 2016-02-13 ENCOUNTER — Non-Acute Institutional Stay (SKILLED_NURSING_FACILITY): Payer: Medicare Other | Admitting: Adult Health

## 2016-02-13 DIAGNOSIS — I2782 Chronic pulmonary embolism: Secondary | ICD-10-CM

## 2016-02-13 DIAGNOSIS — I6340 Cerebral infarction due to embolism of unspecified cerebral artery: Secondary | ICD-10-CM

## 2016-02-13 DIAGNOSIS — I5042 Chronic combined systolic (congestive) and diastolic (congestive) heart failure: Secondary | ICD-10-CM

## 2016-02-13 DIAGNOSIS — I2510 Atherosclerotic heart disease of native coronary artery without angina pectoris: Secondary | ICD-10-CM

## 2016-02-13 DIAGNOSIS — F0280 Dementia in other diseases classified elsewhere without behavioral disturbance: Secondary | ICD-10-CM | POA: Diagnosis not present

## 2016-02-13 DIAGNOSIS — N183 Chronic kidney disease, stage 3 unspecified: Secondary | ICD-10-CM

## 2016-02-13 DIAGNOSIS — F0150 Vascular dementia without behavioral disturbance: Secondary | ICD-10-CM

## 2016-02-13 DIAGNOSIS — J449 Chronic obstructive pulmonary disease, unspecified: Secondary | ICD-10-CM

## 2016-02-13 DIAGNOSIS — F329 Major depressive disorder, single episode, unspecified: Secondary | ICD-10-CM

## 2016-02-13 DIAGNOSIS — I110 Hypertensive heart disease with heart failure: Secondary | ICD-10-CM | POA: Diagnosis not present

## 2016-02-13 DIAGNOSIS — M79605 Pain in left leg: Secondary | ICD-10-CM | POA: Diagnosis not present

## 2016-02-13 DIAGNOSIS — M79604 Pain in right leg: Secondary | ICD-10-CM | POA: Diagnosis not present

## 2016-02-13 DIAGNOSIS — F0393 Unspecified dementia, unspecified severity, with mood disturbance: Secondary | ICD-10-CM

## 2016-02-13 NOTE — Progress Notes (Signed)
Patient ID: Grant Lara, male   DOB: 28-Feb-1941, 75 y.o.   MRN: [id number]    Location:   [facility] Room Number: 107-B Place of Service:  SNF (31)   CODE STATUS: Full Code  No Known Allergies  Chief Complaint  Patient presents with  . Medical Management of Chronic Issues    Follow up    HPI:  He is a long term resident of this facility being seen for the management of his chronic illnesses. He is unable to fully participate in the hpi or ros. There are no nursing concerns at this time.   Past Medical History:  Diagnosis Date  . AICD (automatic cardioverter/defibrillator) present   . Arthritis    "used to have a touch in my legs"  . Bradycardia 06/01/2014  . CAD (coronary artery disease)    LHC 9/05 with Dr. Einar Gip:  dLM 20-30%, LAD 85%, oD1 20-30%.  PCI:  Taxus DES to LAD; Dx jailed and tx with POBA.  Last myoview 12/10: inf scar, no ischemia, EF 29%.  . CHF (congestive heart failure) ([place])   . Crohn's disease ([place])   . Dementia   . Diabetes mellitus    11/05/11 "borderline; don't take medications"  . Dilated cardiomyopathy ([place])    2/12: EF 30-35%, trivial AI, mild RAE.  EF 2014 40 -45%  . DVT (deep venous thrombosis) ([place])   . HCAP (healthcare-associated pneumonia) 09/29/2015  . HLD (hyperlipidemia)   . 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 Hyperlipidemia   . Hypertension   . Memory difficulties   . Nephrolithiasis   . PFO (patent foramen ovale)    Not mentioned on 2014 echo.  . Pneumonia ~ 2011   09-22-13 denies any recent SOB or breathing problems  . Pulmonary embolus (HCC)    chronic coumadin  . Stroke [facility]) 1993   "left arm can't hold steady; leg too"  . Swelling of both ankles     09-22-13 occ.feet, but denies pain.    Past Surgical History:  Procedure Laterality Date  . APPENDECTOMY    . COLON SURGERY  1994; 1996   "for Crohn's disease"  . IMPLANTABLE CARDIOVERTER DEFIBRILLATOR IMPLANT N/A 06/02/2014   Procedure: IMPLANTABLE CARDIOVERTER  DEFIBRILLATOR IMPLANT;  Surgeon: [name], MD;  Location: [facility] CATH LAB;  Service: Cardiovascular;  Laterality: N/A;    Social History   Social History  . Marital status: Married    Spouse name: N/A  . Number of children: N/A  . Years of education: N/A   Occupational History  . Not on file.   Social History Main Topics  . Smoking status: Current Some Day Smoker    Packs/day: 0.50    Years: 53.00    Types: Cigarettes  . Smokeless tobacco: Never Used  . Alcohol use No  . Drug use: No  . Sexual activity: Not Currently   Other Topics Concern  . Not on file   Social History Narrative  . No narrative on file   Family History  Problem Relation Age of Onset  . Diabetes type II Mother   . CAD Father   . Diabetes type II Brother       VITAL SIGNS BP 133/70   Pulse 73   Temp 97.5 F (36.4 C) (Oral)   Resp 20   Ht 5' 3"  (1.6 m)   Wt 129 lb (58.5 kg)   SpO2 97%   BMI 22.85 kg/m   Patient's Medications  New Prescriptions  No medications on file  Previous Medications   ACETAMINOPHEN (TYLENOL) 500 MG TABLET    Take 1,000 mg by mouth daily.    ALPRAZOLAM (XANAX) 0.5 MG TABLET    Take 1 tablet (0.5 mg total) by mouth at bedtime as needed.   ASPIRIN 81 MG TABLET    Take 81 mg by mouth daily.   ATORVASTATIN (LIPITOR) 10 MG TABLET    Take 1 tablet (10 mg total) by mouth daily at 6 PM.   DONEPEZIL (ARICEPT) 10 MG TABLET    Take 10 mg by mouth at bedtime.    FEEDING SUPPLEMENT, ENSURE ENLIVE, (ENSURE ENLIVE) LIQD    Take 237 mLs by mouth 2 (two) times daily between meals.   LATANOPROST (XALATAN) 0.005 % OPHTHALMIC SOLUTION    Place 1 drop into both eyes at bedtime.   LEVALBUTEROL (XOPENEX) 0.63 MG/3ML NEBULIZER SOLUTION    Take 3 mLs (0.63 mg total) by nebulization every 6 (six) hours as needed for wheezing or shortness of breath.   RIVAROXABAN (XARELTO) 20 MG TABS TABLET    Take 1 tablet (20 mg total) by mouth daily with supper.   SENNA (SENOKOT) 8.6 MG TABS TABLET     Take 1 tablet by mouth every morning.   SERTRALINE (ZOLOFT) 50 MG TABLET    Take 50 mg by mouth every morning.    TAMSULOSIN (FLOMAX) 0.4 MG CAPS CAPSULE    Take 1 capsule (0.4 mg total) by mouth daily.  Modified Medications   No medications on file  Discontinued Medications   No medications on file     SIGNIFICANT DIAGNOSTIC EXAMS  09-08-15: KUB: No evidence of bowel obstruction. No acute fracture noted.  09-08-15: chest x-ray: No acute cardiopulmonary disease. COPD.  09-08-15: ct of head and cervical spine: 1. No evidence of acute intracranial or cervical spine injury. 2. Extensive chronic ischemic injury as described, stable from 12/21/2014 comparison.  09-09-15: EEG: Clinical Interpretation: This essentially normal EEG is recorded in the waking and sleep state. There was no seizure or seizure predisposition recorded on this study. Please note that a normal EEG does not preclude the possibility of epilepsy.  6-16 ct of head: No acute abnormality. Extensive atrophy and multiple remote infarctions as seen on prior exams  09-30-15: chest x-ray: No acute cardiopulmonary abnormality seen.  09-30-15: swallow study: Regular solids;Thin liquid   10-05-15: chest x-ray: Retrocardiac density may represent atelectasis versus infiltrate.  10-06-15: right upper quad ultrasound: 1.  Small gallstone.  No biliary distention. 2. Liver is slightly echogenic suggesting fatty infiltration .  10-06-15: 2-d echo:   - Left ventricle: The cavity size was normal. Systolic function was moderately to severely reduced. The estimated ejection fraction was in the range of 30% to 35%. Diffuse hypokinesis. Doppler parameters are consistent with abnormal left ventricular relaxation (grade 1 diastolic dysfunction). There was no evidence of elevated ventricular filling pressure by Doppler parameters. - Aortic valve: There was mild regurgitation. - Aortic root: The aortic root was normal in size. - Mitral valve:  Structurally normal valve. There was mild regurgitation. - Left atrium: The atrium was mildly dilated. - Right ventricle: Pacer wire or catheter noted in right ventricle. Systolic function was normal. - Right atrium: The atrium was moderately dilated. Pacer wire or catheter noted in right atrium. - Tricuspid valve: There was mild regurgitation. - Pulmonary arteries: Systolic pressure was mildly increased. PA peak pressure: 39 mm Hg (S). - Inferior vena cava: The vessel was dilated. The respirophasic diameter changes  were blunted (< 50%), consistent with elevated central venous pressure. - Pericardium, extracardiac: There was no pericardial effusion. Impressions: - When compared to the prior study from 05/31/14 the LVEF has improved, now 30-35% with diffuse hypokinesis.  10-11-15: chest x-ray: right lower lobe atelectasis; no active TB.   11-16-15: left hip and pelvis: no acute osseous abnormalities.     LABS REVIEWED:   03-29-15: wbc 4.9; hgb 9.7; hct 32.2; mcv 78.0; plt 247; glucose 97; bun 12; creat 1.11; k+ 4.0; na++141  09-08-15: wbc 7.2; hgb 7.3; hct 26.5; mcv 69.6; plt clump; glucose 218; bun 15; creat 1.31; k+ 4.0; na++ 137; liver normal albumin 3.0; mag 1.7; hgb a1c 6.8; tsh 1.135 09-10-15: wbc 9.8; hgb 9.0; hxct 31.2. mcv 71.6; plt 179; glucose 75; bun 14; creat 1.16; k+ 4.1; na++ 139 09-11-15: wbc 10.4; hg 8.7; hct 30.1; mcv 72.5; plt 208  09-15-15: wbc 9.2; hgb 9.8; hct 35.6; mcv 72.0; plt 251; glucose 133; bun 18.1; creat 1.04; k+ 4.6; na++ 140; liver normal albumin 3.0; chol 135; ldl 37; tri 82; hdl 81; hgb a1c 5.5 PSA 3.60  10-08-15: wbc 13.7; hgb 9.9; hct 33.3; mcv 74.0; plt 375; glucose 113; bun 23; creat 1.20; k+ 4.6; na++ 136      Review of Systems  Unable to perform ROS: dementia    Physical Exam  Constitutional: No distress.  Frail   Eyes: Conjunctivae are normal.  Neck: Neck supple. No JVD present. No thyromegaly present.  Cardiovascular: Normal rate, regular rhythm  and intact distal pulses.   Respiratory: Effort normal and breath sounds normal. No respiratory distress. He has no wheezes.  GI: Soft. Bowel sounds are normal. He exhibits no distension. There is no tenderness.  Musculoskeletal: He exhibits no edema.  Left hemiparesis  Lymphadenopathy:    He has no cervical adenopathy.  Neurological: He is alert.  Skin: Skin is warm and dry. He is not diaphoretic.  Psychiatric: He has a normal mood and affect.      ASSESSMENT/ PLAN:   1.  Bilateral lower extremity pain: will continue tylenol 1 gm  daily for pain and will monitor his status   2. Dyslipidemia: will continue lipitor 10 mg daily ldl is 37   3. Dementia: without significant change; his current weight is 124 pounds is presently stable; will continue aricept 10 mg nightly   4. BPH: will continue flomax 0.4 mg daily   5. Chronic diastoic heart failure: EF 30-35% (10-06-15) is status post ICD placement   is currently not on medications; will not make changes will monitor  6. PE: is stable will continue xarelto 20 mg daily   7. CVA: is neurologically stable will continue xarelto 20 mg daily   8. Depression: will continue zoloft 50 mg daily and takes xanax 0.5 mg nightly for anxiety  As needed   9. CAD: no indications of chest pain present; will continue asa 81 mg daily  10. Glaucoma: will continue xalatan to both eyes.   11. Constipation: will continue senna daily   12. COPD: will not make changes will monitor his status.   13. CKD stage III: bun/creat 23/1.20   14. Hypertension: will continue to monitor his status.   Ok Edwards NP [facility]  Contact [phone] Monday through Friday 8am- 5pm  After hours call [phone]

## 2016-03-12 ENCOUNTER — Encounter: Payer: Self-pay | Admitting: Internal Medicine

## 2016-03-12 ENCOUNTER — Non-Acute Institutional Stay (SKILLED_NURSING_FACILITY): Payer: Medicare Other | Admitting: Internal Medicine

## 2016-03-12 DIAGNOSIS — M79604 Pain in right leg: Secondary | ICD-10-CM | POA: Diagnosis not present

## 2016-03-12 DIAGNOSIS — I2782 Chronic pulmonary embolism: Secondary | ICD-10-CM | POA: Diagnosis not present

## 2016-03-12 DIAGNOSIS — N183 Chronic kidney disease, stage 3 unspecified: Secondary | ICD-10-CM

## 2016-03-12 DIAGNOSIS — I6340 Cerebral infarction due to embolism of unspecified cerebral artery: Secondary | ICD-10-CM

## 2016-03-12 DIAGNOSIS — I5042 Chronic combined systolic (congestive) and diastolic (congestive) heart failure: Secondary | ICD-10-CM | POA: Diagnosis not present

## 2016-03-12 DIAGNOSIS — F0150 Vascular dementia without behavioral disturbance: Secondary | ICD-10-CM | POA: Diagnosis not present

## 2016-03-12 DIAGNOSIS — M79605 Pain in left leg: Secondary | ICD-10-CM

## 2016-03-12 NOTE — Progress Notes (Signed)
Patient ID: Grant Lara, male   DOB: 1940/07/30, 75 y.o.   MRN: [id number]   Location:  [place] Room Number: 107-B Place of Service:  SNF [phone]) Provider:  Granville Lewis PA-C  [name], MD  Patient Care Team: Maury Dus, MD as PCP - General [facility])  Extended Emergency Contact Information Primary Emergency Contact: Adames,[name] Address: [address]          [place], [place] [address] [name] of [place] Phone: [phone] Mobile Phone: [phone] Relation: Spouse Secondary Emergency Contact: [name],[name]          [place], [place] of [place] Phone: [phone] Relation: Daughter   Goals of care: Advanced Directive information Advanced Directives 03/12/2016  Does Patient Have a Medical Advance Directive? No  Type of Advance Directive -  Does patient want to make changes to medical advance directive? -  Copy of [place] in Chart? -  Would patient like information on creating a medical advance directive? -  Pre-existing out of facility DNR order (yellow form or pink MOST form) -     Chief Complaint  Patient presents with  . Medical Management of Chronic Issues    Follow up   Of chronic medical conditions HPI:  Pt is a 75 y.o. male seen today for medical management of chronic diseases.Including dementia- history of CVA and coronary artery disease-history of pulmonary embolism as well as chronic diastolic CHF.  COPD-chronic kidney disease-hypertension-chronic pain   Nursing staff has not really reported any recent concerns-patient is a poor historian and cannot give any significant review of systems.  His vital signs appear to be stable his weight is stable at 126.7.  He does have a history of dementia but appears to be doing relatively well with supportive care he is on Aricept 10 mg a day-.  In regards to a history of pulmonary embolism in the past he is on Xarelto one  appears to have tolerated this well he has not complain of shortness of breath or chest pain.  He does have a history of bilateral lower extremity pain which appears relatively chronic she is receiving Tylenol this point he is not really complaining of pain today.  In regards to history of BPH continues on Flomax.     Past Medical History:  Diagnosis Date  . AICD (automatic cardioverter/defibrillator) present   . Arthritis    "used to have a touch in my legs"  . Bradycardia 06/01/2014  . CAD (coronary artery disease)    LHC 9/05 with Dr. Einar Gip:  dLM 20-30%, LAD 85%, oD1 20-30%.  PCI:  Taxus DES to LAD; Dx jailed and tx with POBA.  Last myoview 12/10: inf scar, no ischemia, EF 29%.  . CHF (congestive heart failure) ([place])   . Crohn's disease ([place])   . Dementia   . Diabetes mellitus    11/05/11 "borderline; don't take medications"  . Dilated cardiomyopathy ([place])    2/12: EF 30-35%, trivial AI, mild RAE.  EF 2014 40 -45%  . DVT (deep venous thrombosis) ([place])   . HCAP (healthcare-associated pneumonia) 09/29/2015  . HLD (hyperlipidemia)   . 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 Hyperlipidemia   . Hypertension   . Memory difficulties   . Nephrolithiasis   . PFO (patent foramen ovale)    Not mentioned on 2014 echo.  . Pneumonia ~ 2011   09-22-13 denies any recent SOB or breathing problems  . Pulmonary embolus ([place])  chronic coumadin  . Stroke [facility]) 1993   "left arm can't hold steady; leg too"  . Swelling of both ankles     09-22-13 occ.feet, but denies pain.   Past Surgical History:  Procedure Laterality Date  . APPENDECTOMY    . COLON SURGERY  1994; 1996   "for Crohn's disease"  . IMPLANTABLE CARDIOVERTER DEFIBRILLATOR IMPLANT N/A 06/02/2014   Procedure: IMPLANTABLE CARDIOVERTER DEFIBRILLATOR IMPLANT;  Surgeon: [name], MD;  Location: [facility] CATH LAB;  Service: Cardiovascular;  Laterality: N/A;    No Known Allergies    Medication List       Accurate as of 03/12/16 10:54 AM.  Always use your most recent med list.          acetaminophen 500 MG tablet Commonly known as:  TYLENOL Take 1,000 mg by mouth daily.   ALPRAZolam 0.5 MG tablet Commonly known as:  XANAX Take 1 tablet (0.5 mg total) by mouth at bedtime as needed.   aspirin 81 MG tablet Take 81 mg by mouth daily.   atorvastatin 10 MG tablet Commonly known as:  LIPITOR Take 1 tablet (10 mg total) by mouth daily at 6 PM.   donepezil 10 MG tablet Commonly known as:  ARICEPT Take 10 mg by mouth at bedtime.   feeding supplement (ENSURE ENLIVE) Liqd Take 237 mLs by mouth 2 (two) times daily between meals.   latanoprost 0.005 % ophthalmic solution Commonly known as:  XALATAN Place 1 drop into both eyes at bedtime.   levalbuterol 0.63 MG/3ML nebulizer solution Commonly known as:  XOPENEX Take 3 mLs (0.63 mg total) by nebulization every 6 (six) hours as needed for wheezing or shortness of breath.   rivaroxaban 20 MG Tabs tablet Commonly known as:  XARELTO Take 1 tablet (20 mg total) by mouth daily with supper.   senna 8.6 MG Tabs tablet Commonly known as:  SENOKOT Take 1 tablet by mouth every morning.   sertraline 50 MG tablet Commonly known as:  ZOLOFT Take 50 mg by mouth every morning.   tamsulosin 0.4 MG Caps capsule Commonly known as:  FLOMAX Take 1 capsule (0.4 mg total) by mouth daily.       Review of Systems   Largely unobtainable secondary to dementia but he is not complaining of any shortness of breath  Or  chest pain is not really complaining of leg pain today-  Immunization History  Administered Date(s) Administered  . Influenza Whole 02/09/2010  . PPD Test 11/05/2011  . Pneumococcal Polysaccharide-23 02/19/2010   Pertinent  Health Maintenance Due  Topic Date Due  . FOOT EXAM  04/15/2016 (Originally 10/16/1950)  . OPHTHALMOLOGY EXAM  04/15/2016 (Originally 10/16/1950)  . URINE MICROALBUMIN  04/15/2016 (Originally 10/16/1950)  . PNA vac Low Risk Adult (2 of 2 - PCV13)  04/16/2023 (Originally 02/20/2011)  . HEMOGLOBIN A1C  03/16/2016  . INFLUENZA VACCINE  Completed   No flowsheet data found. Functional Status Survey:    Vitals:   03/12/16 1052  BP: 133/70  Pulse: 73  Resp: 20  Temp: 97.5 F (36.4 C)  TempSrc: Oral  SpO2: 97%  Weight: 126 lb (57.2 kg)  Height: 5' 3"  (1.6 m)   Body mass index is 22.32 kg/m. Physical Exam   Constitutional: No distress. Lying comfortably in bed pleasant smiling  Frail   Eyes: Conjunctivae are normal.  Neck: Neck supple. No JVD present. No thyromegaly present.  Cardiovascular: Normal rate, regular rhythm and intact distal pulses.   Respiratory: Effort normal and  breath sounds normal. No respiratory distress. He has no wheezes.  GI: Soft. Bowel sounds are normal. He exhibits no distension. There is no tenderness.  Musculoskeletal: He exhibits no edema.  Left hemiparesis  Lymphadenopathy:    He has no cervical adenopathy.  Neurological: He is alert. Pleasant and smiling Skin: Skin is warm and dry. He is not diaphoretic.  Psychiatric: He has a normal mood and affect.     Labs reviewed:  Recent Labs  09/08/15 1902  10/05/15 2257 10/06/15 10/06/15 0519 10/08/15 10/08/15 0446  NA  --   < > 137 133* 133* 136* 136  K  --   < > 5.1 4.3 4.3  --  4.6  CL  --   < > 104  --  102  --  101  CO2  --   < > 26  --  25  --  29  GLUCOSE  --   < > 141*  --  234*  --  113*  BUN  --   < > _0 23* 23*  CREATININE  --   < > 1.34* 1.3 1.33* 1.2 1.20  CALCIUM  --   < > 9.0  --  8.5*  --  9.4  MG 1.7  --   --   --   --   --   --   < > = values in this interval not displayed.  Recent Labs  09/30/15 0512 10/05/15 10/05/15 2257 10/06/15 0519  AST 19  --  155* 122*  ALT 37  --  263* 221*  ALKPHOS 109 170* 170* 152*  BILITOT 1.0  --  0.3 0.2*  PROT 6.3*  --  7.0 6.1*  ALBUMIN 2.5*  --  2.3* 2.1*    Recent Labs  09/08/15 1006  09/29/15 1210  10/05/15 2257 10/07/15 10/07/15 0543 10/08/15 10/08/15 0446    WBC 7.2  < > 19.2*  < > 17.0* 13.3 13.3* 13.7 13.7*  NEUTROABS 5.5  --  15.6*  --  14.4*  --   --   --   --   HGB 7.3*  < > 9.2*  < > 7.4* 9.8* 9.8*  --  9.9*  HCT 26.5*  < > 32.1*  < > 26.3* 32* 31.7*  --  33.3*  MCV 69.6*  < > 73.6*  < > 72.5*  --  73.4*  --  74.0*  PLT PLATELET CLUMPS NOTED ON SMEAR, UNABLE TO ESTIMATE  < > 309  < > 363 362 362  --  375  < > = values in this interval not displayed. Lab Results  Component Value Date   TSH 1.64 09/15/2015   Lab Results  Component Value Date   HGBA1C 5.5 09/15/2015   Lab Results  Component Value Date   CHOL 135 09/15/2015   HDL 81 (A) 09/15/2015   LDLCALC 37 09/15/2015   TRIG 82 09/15/2015   CHOLHDL 3.1 04/16/2014    Significant Diagnostic Results in last 30 days:  No results found.  Assessment/Plan  1 dementia-he continues to do well with supportive care his weight is stable he is on Aricept 10 mg daily at bedtime.  #2 history of chronic diastolic CHF feed does not show evidence of edema here or chest congestion he does have a listed ejection fraction of 30-35 percent on echo done in June 2017 he is status post ICD placement.  Appears to be stable despite not being on medications currently.  #3 history  of pulmonary embolism this appears stable he is on Xarelto 20 mg a day.  #4 history of CVA neurologically appears to be stable he is again on Xarelto 20 mg a day.  #5 history of bilateral leg pain this appears at this point be controlled with his Tylenol is not really complaining of pain today.  #6 history of dyslipidemia he is on Lipitor 10 mg a day LDL is 37.  #7 history of leukocytosis this appears to be chronic per chart review with white count baseline about 13,000 on recent labs-we will update this.  He does not show any evidence of fever chills or sepsis.  #8 history of renal insufficiency creatinine of 1.2 on lab done on June 2017 appears relatively baseline Will update this as well.  #9 history coronary  artery disease no complaints of chest pain to my knowledge he is on aspirin 81 mg daily.   #10 history of depression this appears stable he continues to be in good spirits pleasant smiling confused with his history of dementia he is on Zoloft as well as Xanax at night as needed.  #11 history BPH this appears to be controlled on Flomax 0.4 mg a day.  COPD-this appears to have been stable for some time he continues on levalbuterol every 6 hours when necessary  #12 history of elevated liver function tests per chart review it appears he's had some elevated liver function tests in June with AST of 120-ALT of 221 and alk phosphatase 152-albumin was also 2.1 will have this updated he is not complaining of any abdominal discomfort nausea or vomiting   Again will update a CBC to keep an eye on anemia and note hemoglobin of 9.9 appears relatively baseline also keep an eye on leukocytosis which appears to be chronic.  Also update a metabolic panel with his history of renal insufficiency as well as elevated liver function tests.  [id number] note greater than 35 minutes spent assessing patient-reviewing his chart reviewing his labs-and coordinating and formulating plan of care for numerous diagnoses-of note greater than 50% of time spent coordinating plan of care

## 2016-03-25 LAB — HM DIABETES EYE EXAM

## 2016-03-26 ENCOUNTER — Observation Stay (HOSPITAL_COMMUNITY)
Admission: EM | Admit: 2016-03-26 | Discharge: 2016-03-28 | Disposition: A | Discharge status: Skilled Nursing Facility | Payer: Medicare Other | Attending: Internal Medicine | Admitting: Internal Medicine

## 2016-03-26 ENCOUNTER — Emergency Department (HOSPITAL_COMMUNITY): Payer: Medicare Other

## 2016-03-26 ENCOUNTER — Encounter (HOSPITAL_COMMUNITY): Payer: Self-pay

## 2016-03-26 DIAGNOSIS — N183 Chronic kidney disease, stage 3 (moderate): Secondary | ICD-10-CM | POA: Diagnosis not present

## 2016-03-26 DIAGNOSIS — Z955 Presence of coronary angioplasty implant and graft: Secondary | ICD-10-CM | POA: Diagnosis not present

## 2016-03-26 DIAGNOSIS — Z95810 Presence of automatic (implantable) cardiac defibrillator: Secondary | ICD-10-CM | POA: Diagnosis present

## 2016-03-26 DIAGNOSIS — D631 Anemia in chronic kidney disease: Secondary | ICD-10-CM | POA: Diagnosis not present

## 2016-03-26 DIAGNOSIS — Z8673 Personal history of transient ischemic attack (TIA), and cerebral infarction without residual deficits: Secondary | ICD-10-CM | POA: Diagnosis not present

## 2016-03-26 DIAGNOSIS — F329 Major depressive disorder, single episode, unspecified: Secondary | ICD-10-CM | POA: Diagnosis not present

## 2016-03-26 DIAGNOSIS — Z7982 Long term (current) use of aspirin: Secondary | ICD-10-CM | POA: Diagnosis not present

## 2016-03-26 DIAGNOSIS — R410 Disorientation, unspecified: Secondary | ICD-10-CM | POA: Diagnosis present

## 2016-03-26 DIAGNOSIS — Z8249 Family history of ischemic heart disease and other diseases of the circulatory system: Secondary | ICD-10-CM | POA: Diagnosis not present

## 2016-03-26 DIAGNOSIS — Z7901 Long term (current) use of anticoagulants: Secondary | ICD-10-CM | POA: Insufficient documentation

## 2016-03-26 DIAGNOSIS — E1122 Type 2 diabetes mellitus with diabetic chronic kidney disease: Secondary | ICD-10-CM | POA: Insufficient documentation

## 2016-03-26 DIAGNOSIS — M1990 Unspecified osteoarthritis, unspecified site: Secondary | ICD-10-CM | POA: Insufficient documentation

## 2016-03-26 DIAGNOSIS — I5042 Chronic combined systolic (congestive) and diastolic (congestive) heart failure: Secondary | ICD-10-CM | POA: Diagnosis not present

## 2016-03-26 DIAGNOSIS — F17210 Nicotine dependence, cigarettes, uncomplicated: Secondary | ICD-10-CM | POA: Diagnosis not present

## 2016-03-26 DIAGNOSIS — Z86711 Personal history of pulmonary embolism: Secondary | ICD-10-CM | POA: Insufficient documentation

## 2016-03-26 DIAGNOSIS — I2510 Atherosclerotic heart disease of native coronary artery without angina pectoris: Secondary | ICD-10-CM | POA: Diagnosis not present

## 2016-03-26 DIAGNOSIS — I130 Hypertensive heart and chronic kidney disease with heart failure and stage 1 through stage 4 chronic kidney disease, or unspecified chronic kidney disease: Secondary | ICD-10-CM | POA: Diagnosis not present

## 2016-03-26 DIAGNOSIS — F0150 Vascular dementia without behavioral disturbance: Secondary | ICD-10-CM | POA: Diagnosis not present

## 2016-03-26 DIAGNOSIS — J449 Chronic obstructive pulmonary disease, unspecified: Secondary | ICD-10-CM | POA: Diagnosis not present

## 2016-03-26 DIAGNOSIS — I420 Dilated cardiomyopathy: Secondary | ICD-10-CM | POA: Diagnosis not present

## 2016-03-26 DIAGNOSIS — N400 Enlarged prostate without lower urinary tract symptoms: Secondary | ICD-10-CM | POA: Insufficient documentation

## 2016-03-26 DIAGNOSIS — Z833 Family history of diabetes mellitus: Secondary | ICD-10-CM | POA: Insufficient documentation

## 2016-03-26 DIAGNOSIS — E785 Hyperlipidemia, unspecified: Secondary | ICD-10-CM | POA: Diagnosis not present

## 2016-03-26 DIAGNOSIS — K5090 Crohn's disease, unspecified, without complications: Secondary | ICD-10-CM | POA: Insufficient documentation

## 2016-03-26 LAB — CBC WITH DIFFERENTIAL/PLATELET
BASOS PCT: 0 %
Basophils Absolute: 0 10*3/uL (ref 0.0–0.1)
EOS ABS: 0.1 10*3/uL (ref 0.0–0.7)
EOS PCT: 1 %
HCT: 34.5 % — ABNORMAL LOW (ref 39.0–52.0)
Hemoglobin: 10 g/dL — ABNORMAL LOW (ref 13.0–17.0)
LYMPHS ABS: 1.3 10*3/uL (ref 0.7–4.0)
Lymphocytes Relative: 11 %
MCH: 20.8 pg — AB (ref 26.0–34.0)
MCHC: 29 g/dL — ABNORMAL LOW (ref 30.0–36.0)
MCV: 71.7 fL — AB (ref 78.0–100.0)
MONO ABS: 0.9 10*3/uL (ref 0.1–1.0)
Monocytes Relative: 8 %
NEUTROS PCT: 80 %
Neutro Abs: 9.1 10*3/uL — ABNORMAL HIGH (ref 1.7–7.7)
PLATELETS: 216 10*3/uL (ref 150–400)
RBC: 4.81 MIL/uL (ref 4.22–5.81)
RDW: 17.5 % — AB (ref 11.5–15.5)
WBC: 11.4 10*3/uL — AB (ref 4.0–10.5)

## 2016-03-26 LAB — COMPREHENSIVE METABOLIC PANEL
ALK PHOS: 91 U/L (ref 38–126)
ALT: 30 U/L (ref 17–63)
AST: 25 U/L (ref 15–41)
Albumin: 3.2 g/dL — ABNORMAL LOW (ref 3.5–5.0)
Anion gap: 8 (ref 5–15)
BUN: 16 mg/dL (ref 6–20)
CALCIUM: 9.3 mg/dL (ref 8.9–10.3)
CHLORIDE: 107 mmol/L (ref 101–111)
CO2: 23 mmol/L (ref 22–32)
CREATININE: 1.45 mg/dL — AB (ref 0.61–1.24)
GFR, EST AFRICAN AMERICAN: 52 mL/min — AB (ref >60)
GFR, EST NON AFRICAN AMERICAN: 45 mL/min — AB (ref >60)
Glucose, Bld: 136 mg/dL — ABNORMAL HIGH (ref 65–99)
Potassium: 4.2 mmol/L (ref 3.5–5.1)
Sodium: 138 mmol/L (ref 135–145)
Total Bilirubin: 0.7 mg/dL (ref 0.3–1.2)
Total Protein: 7.1 g/dL (ref 6.5–8.1)

## 2016-03-26 LAB — I-STAT TROPONIN, ED: TROPONIN I, POC: 0.01 ng/mL (ref 0.00–0.08)

## 2016-03-26 MED ORDER — DONEPEZIL HCL 10 MG PO TABS
10.0000 mg | ORAL_TABLET | Freq: Every day | ORAL | Status: DC
  Administered 2016-03-27 – 2016-03-28 (×2): 10 mg via ORAL
  Filled 2016-03-26 (×2): qty 1

## 2016-03-26 MED ORDER — TAMSULOSIN HCL 0.4 MG PO CAPS
0.4000 mg | ORAL_CAPSULE | Freq: Every day | ORAL | Status: DC
  Administered 2016-03-27 – 2016-03-28 (×2): 0.4 mg via ORAL
  Filled 2016-03-26 (×2): qty 1

## 2016-03-26 MED ORDER — ENSURE ENLIVE PO LIQD
237.0000 mL | Freq: Two times a day (BID) | ORAL | Status: DC
  Administered 2016-03-27 – 2016-03-28 (×2): 237 mL via ORAL
  Filled 2016-03-26: qty 237

## 2016-03-26 MED ORDER — SENNA 8.6 MG PO TABS
1.0000 | ORAL_TABLET | Freq: Every morning | ORAL | Status: DC
  Administered 2016-03-27 – 2016-03-28 (×2): 8.6 mg via ORAL
  Filled 2016-03-26 (×2): qty 1

## 2016-03-26 MED ORDER — LATANOPROST 0.005 % OP SOLN
1.0000 [drp] | Freq: Every day | OPHTHALMIC | Status: DC
  Administered 2016-03-27 (×2): 1 [drp] via OPHTHALMIC
  Filled 2016-03-26: qty 2.5

## 2016-03-26 MED ORDER — ALPRAZOLAM 0.5 MG PO TABS: 0.5000 mg | ORAL_TABLET | Freq: Every evening | ORAL | Status: DC | PRN

## 2016-03-26 MED ORDER — RIVAROXABAN 20 MG PO TABS
20.0000 mg | ORAL_TABLET | Freq: Every day | ORAL | Status: DC
  Administered 2016-03-27: 20 mg via ORAL
  Filled 2016-03-26: qty 1

## 2016-03-26 MED ORDER — ASPIRIN 81 MG PO CHEW
81.0000 mg | CHEWABLE_TABLET | Freq: Every day | ORAL | Status: DC
  Administered 2016-03-27 – 2016-03-28 (×2): 81 mg via ORAL
  Filled 2016-03-26 (×2): qty 1

## 2016-03-26 MED ORDER — ATORVASTATIN CALCIUM 10 MG PO TABS
10.0000 mg | ORAL_TABLET | Freq: Every day | ORAL | Status: DC
  Administered 2016-03-27: 10 mg via ORAL
  Filled 2016-03-26: qty 1

## 2016-03-26 MED ORDER — SERTRALINE HCL 50 MG PO TABS
50.0000 mg | ORAL_TABLET | Freq: Every day | ORAL | Status: DC
  Administered 2016-03-27 – 2016-03-28 (×2): 50 mg via ORAL
  Filled 2016-03-26 (×2): qty 1

## 2016-03-26 MED ORDER — LEVALBUTEROL HCL 0.63 MG/3ML IN NEBU: 0.6300 mg | INHALATION_SOLUTION | Freq: Four times a day (QID) | RESPIRATORY_TRACT | Status: DC | PRN

## 2016-03-26 MED ORDER — ACETAMINOPHEN 500 MG PO TABS
1000.0000 mg | ORAL_TABLET | Freq: Every day | ORAL | Status: DC
  Administered 2016-03-27 – 2016-03-28 (×2): 1000 mg via ORAL
  Filled 2016-03-26 (×2): qty 2

## 2016-03-26 NOTE — H&P (Signed)
History and Physical    HARLEM BULA KZS:[id number] DOB: 1940/07/27 DOA: 03/26/2016   PCP: [name], MD Chief Complaint:  Chief Complaint  Patient presents with  . Altered Mental Status    pt had a brief episodde of where he stared off and would not respond to staff lasting two minutes at around 1800    HPI: Grant Lara is a 75 y.o. male with medical history significant of CAD, CHF, stroke, dementia, AICD placement in 2016.  Patient presents to the ED after a 2 min duration "staring" episode of unresponsiveness that occurred earlier this evening at his SNF.  Reported drooling during episode, no seizure like activity.  Patient then returned to baseline.  Patient currently has no complaints.  ED Course: CT head just shows old strokes.  Review of Systems: As per HPI otherwise 10 point review of systems negative.    Past Medical History:  Diagnosis Date  . AICD (automatic cardioverter/defibrillator) present   . Arthritis    "used to have a touch in my legs"  . Bradycardia 06/01/2014  . CAD (coronary artery disease)    LHC 9/05 with Dr. Einar Gip:  dLM 20-30%, LAD 85%, oD1 20-30%.  PCI:  Taxus DES to LAD; Dx jailed and tx with POBA.  Last myoview 12/10: inf scar, no ischemia, EF 29%.  . CHF (congestive heart failure) ([place])   . Crohn's disease ([place])   . Dementia   . Diabetes mellitus    11/05/11 "borderline; don't take medications"  . Dilated cardiomyopathy ([place])    2/12: EF 30-35%, trivial AI, mild RAE.  EF 2014 40 -45%  . DVT (deep venous thrombosis) ([place])   . HCAP (healthcare-associated pneumonia) 09/29/2015  . HLD (hyperlipidemia)   . 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 Hyperlipidemia   . Hypertension   . Memory difficulties   . Nephrolithiasis   . PFO (patent foramen ovale)    Not mentioned on 2014 echo.  . Pneumonia ~ 2011   09-22-13 denies any recent SOB or breathing problems  . Pulmonary embolus ([facility])    chronic coumadin  . Stroke [facility]) 1993   "left arm can't hold  steady; leg too"  . Swelling of both ankles     09-22-13 occ.feet, but denies pain.    Past Surgical History:  Procedure Laterality Date  . APPENDECTOMY    . COLON SURGERY  1994; 1996   "for Crohn's disease"  . IMPLANTABLE CARDIOVERTER DEFIBRILLATOR IMPLANT N/A 06/02/2014   Procedure: IMPLANTABLE CARDIOVERTER DEFIBRILLATOR IMPLANT;  Surgeon: [name], MD;  Location: [facility] CATH LAB;  Service: Cardiovascular;  Laterality: N/A;     reports that he has been smoking Cigarettes.  He has a 26.50 pack-year smoking history. He has never used smokeless tobacco. He reports that he does not drink alcohol or use drugs.  Allergies  Allergen Reactions  . Tuberculin Tests     Per MAR (no description provided)    Family History  Problem Relation Age of Onset  . Diabetes type II Mother   . CAD Father   . Diabetes type II Brother       Prior to Admission medications   Medication Sig Start Date End Date Taking? Authorizing Provider  acetaminophen (TYLENOL) 500 MG tablet Take 1,000 mg by mouth daily.    Yes Historical Provider, MD  ALPRAZolam Duanne Moron) 0.5 MG tablet Take 1 tablet (0.5 mg total) by mouth at bedtime as needed. 10/09/15  Yes Kelvin Cellar, MD  aspirin 81 MG tablet  Take 81 mg by mouth daily.   Yes Historical Provider, MD  atorvastatin (LIPITOR) 10 MG tablet Take 1 tablet (10 mg total) by mouth daily at 6 PM. 04/18/14  Yes Maryann Mikhail, DO  donepezil (ARICEPT) 10 MG tablet Take 10 mg by mouth at bedtime.  02/23/15  Yes Historical Provider, MD  feeding supplement, ENSURE ENLIVE, (ENSURE ENLIVE) LIQD Take 237 mLs by mouth 2 (two) times daily between meals. 10/04/15  Yes Hosie Poisson, MD  latanoprost (XALATAN) 0.005 % ophthalmic solution Place 1 drop into both eyes at bedtime. 06/29/13  Yes Historical Provider, MD  levalbuterol Penne Lash) 0.63 MG/3ML nebulizer solution Take 3 mLs (0.63 mg total) by nebulization every 6 (six) hours as needed for wheezing or shortness of breath. 09/11/15  Yes  Ripudeep Krystal Eaton, MD  rivaroxaban (XARELTO) 20 MG TABS tablet Take 1 tablet (20 mg total) by mouth daily with supper. 06/23/14  Yes Hosie Poisson, MD  senna (SENOKOT) 8.6 MG TABS tablet Take 1 tablet by mouth every morning.   Yes Historical Provider, MD  sertraline (ZOLOFT) 50 MG tablet Take 50 mg by mouth every morning.  05/18/14  Yes Historical Provider, MD  tamsulosin (FLOMAX) 0.4 MG CAPS capsule Take 1 capsule (0.4 mg total) by mouth daily. 04/18/14  Yes Cristal Ford, DO    Physical Exam: Vitals:   03/26/16 1844 03/26/16 1951 03/26/16 2030  BP:  118/68 121/69  Pulse:  60 62  Resp:  10 13  Temp:  97.8 F (36.6 C)   TempSrc:  Oral   SpO2:  100% 98%  Weight: 57.2 kg (126 lb)    Height: 5' 4"  (1.626 m)        Constitutional: NAD, calm, comfortable Eyes: PERRL, lids and conjunctivae normal ENMT: Mucous membranes are moist. Posterior pharynx clear of any exudate or lesions.Normal dentition.  Neck: normal, supple, no masses, no thyromegaly Respiratory: clear to auscultation bilaterally, no wheezing, no crackles. Normal respiratory effort. No accessory muscle use.  Cardiovascular: Regular rate and rhythm, no murmurs / rubs / gallops. No extremity edema. 2+ pedal pulses. No carotid bruits.  Abdomen: no tenderness, no masses palpated. No hepatosplenomegaly. Bowel sounds positive.  Musculoskeletal: no clubbing / cyanosis. No joint deformity upper and lower extremities. Good ROM, no contractures. Normal muscle tone.  Skin: no rashes, lesions, ulcers. No induration Neurologic: CN 2-12 grossly intact. Sensation intact, DTR normal. Strength 5/5 in all 4.  Psychiatric: Normal judgment and insight. Alert and oriented x 3. Normal mood.    Labs on Admission: I have personally reviewed following labs and imaging studies  CBC:  Recent Labs Lab 03/26/16 2008  WBC 11.4*  NEUTROABS 9.1*  HGB 10.0*  HCT 34.5*  MCV 71.7*  PLT 656   Basic Metabolic Panel:  Recent Labs Lab 03/26/16 2008  NA  138  K 4.2  CL 107  CO2 23  GLUCOSE 136*  BUN 16  CREATININE 1.45*  CALCIUM 9.3   GFR: Estimated Creatinine Clearance: 34.5 mL/min (by C-G formula based on SCr of 1.45 mg/dL (H)). Liver Function Tests:  Recent Labs Lab 03/26/16 2008  AST 25  ALT 30  ALKPHOS 91  BILITOT 0.7  PROT 7.1  ALBUMIN 3.2*   No results for input(s): LIPASE, AMYLASE in the last 168 hours. No results for input(s): AMMONIA in the last 168 hours. Coagulation Profile: No results for input(s): INR, PROTIME in the last 168 hours. Cardiac Enzymes: No results for input(s): CKTOTAL, CKMB, CKMBINDEX, TROPONINI in the last 168 hours.  BNP (last 3 results) No results for input(s): PROBNP in the last 8760 hours. HbA1C: No results for input(s): HGBA1C in the last 72 hours. CBG: No results for input(s): GLUCAP in the last 168 hours. Lipid Profile: No results for input(s): CHOL, HDL, LDLCALC, TRIG, CHOLHDL, LDLDIRECT in the last 72 hours. Thyroid Function Tests: No results for input(s): TSH, T4TOTAL, FREET4, T3FREE, THYROIDAB in the last 72 hours. Anemia Panel: No results for input(s): VITAMINB12, FOLATE, FERRITIN, TIBC, IRON, RETICCTPCT in the last 72 hours. Urine analysis:    Component Value Date/Time   COLORURINE YELLOW 10/06/2015 [place] 10/06/2015 1204   LABSPEC 1.013 10/06/2015 1204   PHURINE 6.5 10/06/2015 1204   GLUCOSEU NEGATIVE 10/06/2015 1204   HGBUR NEGATIVE 10/06/2015 1204   BILIRUBINUR NEGATIVE 10/06/2015 1204   KETONESUR NEGATIVE 10/06/2015 1204   PROTEINUR NEGATIVE 10/06/2015 1204   UROBILINOGEN 1.0 12/21/2014 1520   NITRITE NEGATIVE 10/06/2015 1204   LEUKOCYTESUR NEGATIVE 10/06/2015 1204   Sepsis Labs: @LABRCNTIP (procalcitonin:4,lacticidven:4) )No results found for this or any previous visit (from the past 240 hour(s)).   Radiological Exams on Admission: Ct Head Wo Contrast  Result Date: 03/26/2016 CLINICAL DATA:  Altered mental status EXAM: CT HEAD WITHOUT  CONTRAST TECHNIQUE: Contiguous axial images were obtained from the base of the skull through the vertex without intravenous contrast. COMPARISON:  09/29/2015 FINDINGS: Brain: Prior left peripheral cerebellar infarct with small lacunar infarct in the right posterior cerebellum, stable. Prior left PCA distribution infarct with prior suspected watershed infarcts, unchanged from prior. Stable lacunar infarcts in the right thalamus and left lentiform nuclei. Periventricular white matter and corona radiata hypodensities favor chronic ischemic microvascular white matter disease. Vascular: Unremarkable Skull: Unremarkable Sinuses/Orbits: Unremarkable Other: Calcified transverse ligament at C1. IMPRESSION: 1. Old cerebral and cerebellar infarcts without acute findings. 2. Periventricular white matter and corona radiata hypodensities favor chronic ischemic microvascular white matter disease. Electronically Signed   By: Van Clines M.D.   On: 03/26/2016 21:35    EKG: Independently reviewed.  Assessment/Plan Principal Problem:   Confusion Active Problems:   ICD (implantable cardioverter-defibrillator) in place    1. Confusion / unresponsive episode - DDx includes seizure, arrythmia 1. Interrogating AICD 2. Ordering EEG per neuro recs 2. AICD - 1. Interrogating 2. Worth noting that this does appear to be an MRI compatible model if MRI needed for more neuro imaging during stay.   DVT prophylaxis: Xarelto Code Status: Full Family Communication: No family in room Consults called: EDP curbsided neuro who recommended EEG Admission status: Admit to obs   Timber Lucarelli, [facility] Pager [phone] from 7PM-7AM  If 7AM-7PM, please contact the day physician for the patient [email or web address] Password [facility]  03/26/2016, 11:49 PM

## 2016-03-26 NOTE — ED Provider Notes (Signed)
[facility] DEPT Provider Note   CSN: [id number] Arrival date & time: 03/26/16  1839     History   Chief Complaint Chief Complaint  Patient presents with  . Altered Mental Status    pt had a brief episodde of where he stared off and would not respond to staff lasting two minutes at around 1800    HPI Grant Lara is a 75 y.o. male.  Patient with h/o dementia, AICD placement, CAD with stents, CHF, on blood thinner -- presents with c/o staring spell for approx 2 minutes where patient was unresponsive to the staff at his nursing facility. Reported drooling. No seizure-like activity. Patient then came back to his baseline. Patient currently has no complaints. Denies HA, chest pain, shortness of breath. The onset of this condition was acute. The course is constant. Aggravating factors: none. Alleviating factors: none.        Past Medical History:  Diagnosis Date  . AICD (automatic cardioverter/defibrillator) present   . Arthritis    "used to have a touch in my legs"  . Bradycardia 06/01/2014  . CAD (coronary artery disease)    LHC 9/05 with Dr. Einar Gip:  dLM 20-30%, LAD 85%, oD1 20-30%.  PCI:  Taxus DES to LAD; Dx jailed and tx with POBA.  Last myoview 12/10: inf scar, no ischemia, EF 29%.  . CHF (congestive heart failure) ([place])   . Crohn's disease ([place])   . Dementia   . Diabetes mellitus    11/05/11 "borderline; don't take medications"  . Dilated cardiomyopathy ([place])    2/12: EF 30-35%, trivial AI, mild RAE.  EF 2014 40 -45%  . DVT (deep venous thrombosis) ([place])   . HCAP (healthcare-associated pneumonia) 09/29/2015  . HLD (hyperlipidemia)   . 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 Hyperlipidemia   . Hypertension   . Memory difficulties   . Nephrolithiasis   . PFO (patent foramen ovale)    Not mentioned on 2014 echo.  . Pneumonia ~ 2011   09-22-13 denies any recent SOB or breathing problems  . Pulmonary embolus (HCC)    chronic coumadin  . Stroke Grant Lara) 1993   "left arm can't  hold steady; leg too"  . Swelling of both ankles     09-22-13 occ.feet, but denies pain.    Patient Active Problem List   Diagnosis Date Noted  . Protein-calorie malnutrition, moderate ([place]) 09/30/2015  . 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 ICD (implantable cardioverter-defibrillator) in place 09/29/2015  . Vascular dementia without behavioral disturbance 09/25/2015  . Anemia 09/08/2015  . OA (osteoarthritis) 06/06/2015  . Bilateral lower extremity pain 05/14/2015  . BPH (benign prostatic hyperplasia) 04/10/2015  . FTT (failure to thrive) in adult 11/05/2014  . Protein-calorie malnutrition, severe ([place]) 10/30/2014  . 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 Chronic combined systolic and diastolic CHF (congestive heart failure) ([place])   . Pulmonary embolus ([place]) 07/16/2014  . Late effects of CVA (cerebrovascular accident) 07/16/2014  . Cerebral infarction ([place])   . Hyperlipidemia   . Coronary artery disease involving native coronary artery of native heart without angina pectoris   . CKD (chronic kidney disease) stage 3, GFR 30-59 ml/min 04/16/2014  . Anemia, chronic disease 11/21/2013  . DM2 (diabetes mellitus, type 2) ([place]) 11/20/2013  . Small bowel obstruction 07/13/2013  . Crohn's disease ([place]) 02/16/2012  . DVT (deep venous thrombosis) ([place]) 06/05/2010  . COPD (chronic obstructive pulmonary disease) ([place]) 04/05/2010  . Hypertensive heart disease with CHF (congestive  heart failure) ([place]) 02/10/2009  . TOBACCO ABUSE 12/12/2008  . Dilated cardiomyopathy ([place]) 12/12/2008    Past Surgical History:  Procedure Laterality Date  . APPENDECTOMY    . COLON SURGERY  1994; 1996   "for Crohn's disease"  . IMPLANTABLE CARDIOVERTER DEFIBRILLATOR IMPLANT N/A 06/02/2014   Procedure: IMPLANTABLE CARDIOVERTER DEFIBRILLATOR IMPLANT;  Surgeon: [name], MD;  Location: [facility] CATH LAB;  Service: Cardiovascular;  Laterality: N/A;       Home Medications    Prior to Admission  medications   Medication Sig Start Date End Date Taking? Authorizing Provider  acetaminophen (TYLENOL) 500 MG tablet Take 1,000 mg by mouth daily.     Historical Provider, MD  ALPRAZolam Duanne Moron) 0.5 MG tablet Take 1 tablet (0.5 mg total) by mouth at bedtime as needed. 10/09/15   Kelvin Cellar, MD  aspirin 81 MG tablet Take 81 mg by mouth daily.    Historical Provider, MD  atorvastatin (LIPITOR) 10 MG tablet Take 1 tablet (10 mg total) by mouth daily at 6 PM. 04/18/14   Maryann Mikhail, DO  donepezil (ARICEPT) 10 MG tablet Take 10 mg by mouth at bedtime.  02/23/15   Historical Provider, MD  feeding supplement, ENSURE ENLIVE, (ENSURE ENLIVE) LIQD Take 237 mLs by mouth 2 (two) times daily between meals. 10/04/15   Hosie Poisson, MD  latanoprost (XALATAN) 0.005 % ophthalmic solution Place 1 drop into both eyes at bedtime. 06/29/13   Historical Provider, MD  levalbuterol Penne Lash) 0.63 MG/3ML nebulizer solution Take 3 mLs (0.63 mg total) by nebulization every 6 (six) hours as needed for wheezing or shortness of breath. 09/11/15   Ripudeep Krystal Eaton, MD  rivaroxaban (XARELTO) 20 MG TABS tablet Take 1 tablet (20 mg total) by mouth daily with supper. 06/23/14   Hosie Poisson, MD  senna (SENOKOT) 8.6 MG TABS tablet Take 1 tablet by mouth every morning.    Historical Provider, MD  sertraline (ZOLOFT) 50 MG tablet Take 50 mg by mouth every morning.  05/18/14   Historical Provider, MD  tamsulosin (FLOMAX) 0.4 MG CAPS capsule Take 1 capsule (0.4 mg total) by mouth daily. 04/18/14   Cristal Ford, DO    Family History Family History  Problem Relation Age of Onset  . Diabetes type II Mother   . CAD Father   . Diabetes type II Brother     Social History Social History  Substance Use Topics  . Smoking status: Current Some Day Smoker    Packs/day: 0.50    Years: 53.00    Types: Cigarettes  . Smokeless tobacco: Never Used  . Alcohol use No     Allergies   Patient has no known allergies.   Review of  Systems Review of Systems  Constitutional: Negative for fever.  HENT: Negative for rhinorrhea and sore throat.   Eyes: Negative for redness.  Respiratory: Negative for cough.   Cardiovascular: Negative for chest pain.  Gastrointestinal: Negative for abdominal pain, diarrhea, nausea and vomiting.  Genitourinary: Negative for dysuria.  Musculoskeletal: Negative for myalgias.  Skin: Negative for rash.  Neurological: Positive for syncope (staring spell). Negative for numbness and headaches.     Physical Exam Updated Vital Signs BP 118/68 (BP Location: Left Arm)   Pulse 60   Temp 97.8 F (36.6 C) (Oral)   Resp 10   Ht 5' 4"  (1.626 m)   Wt 57.2 kg   SpO2 100%   BMI 21.63 kg/m   Physical Exam  Constitutional: He is oriented to person,  place, and time. He appears well-developed and well-nourished.  HENT:  Head: Normocephalic and atraumatic.  Right Ear: Tympanic membrane, external ear and ear canal normal.  Left Ear: Tympanic membrane, external ear and ear canal normal.  Nose: Nose normal.  Mouth/Throat: Uvula is midline, oropharynx is clear and moist and mucous membranes are normal.  Eyes: Conjunctivae, EOM and lids are normal. Pupils are equal, round, and reactive to light. Right eye exhibits no discharge. Left eye exhibits no discharge.  Neck: Normal range of motion. Neck supple.  Cardiovascular: Normal rate, regular rhythm and normal heart sounds.   Pulmonary/Chest: Effort normal and breath sounds normal.  Abdominal: Soft. There is no tenderness.  Musculoskeletal: Normal range of motion.       Cervical back: He exhibits normal range of motion, no tenderness and no bony tenderness.  Neurological: He is alert and oriented to person, place, and time. He has normal strength and normal reflexes. No cranial nerve deficit or sensory deficit. He exhibits normal muscle tone. He displays a negative Romberg sign. Coordination and gait normal. GCS eye subscore is 4. GCS verbal subscore is  5. GCS motor subscore is 6.  Skin: Skin is warm and dry.  Psychiatric: He has a normal mood and affect.  Nursing note and vitals reviewed.    ED Treatments / Results  Labs (all labs ordered are listed, but only abnormal results are displayed) Labs Reviewed  COMPREHENSIVE METABOLIC PANEL  CBC WITH DIFFERENTIAL/PLATELET  I-STAT TROPOININ, ED    EKG  EKG Interpretation None       Radiology No results found.  Procedures Procedures (including critical care time)  Medications Ordered in ED Medications - No data to display   Initial Impression / Assessment and Plan / ED Course  I have reviewed the triage vital signs and the nursing notes.  Pertinent labs & imaging results that were available during my care of the patient were reviewed by me and considered in my medical decision making (see chart for details).  Clinical Course    Patient seen and examined. Work-up initiated. No current CP, pt has no complaints currently.   Vital signs reviewed and are as follows: BP 118/68 (BP Location: Left Arm)   Pulse 60   Temp 97.8 F (36.6 C) (Oral)   Resp 10   Ht 5' 4"  (1.626 m)   Wt 57.2 kg   SpO2 100%   BMI 21.63 kg/m   EKG reviewed with Dr. Roderic Palau. EKG unchanged from previous.   8:21 PM Handoff to Dr. Roderic Palau at shift change who will dispo.   Final Clinical Impressions(s) / ED Diagnoses   Final diagnoses:  None    New Prescriptions New Prescriptions   No medications on file     Carlisle Cater, PA-C 03/26/16 2021    Carlisle Cater, PA-C 03/26/16 1610    Milton Ferguson, MD 03/26/16 [phone]

## 2016-03-26 NOTE — ED Notes (Signed)
Attempted IV x 2 without success; two other attempts by previous RN

## 2016-03-26 NOTE — ED Triage Notes (Signed)
No deficits noted pt has a history of dementia as per staff he is now alert to his baseline

## 2016-03-26 NOTE — ED Provider Notes (Signed)
Patient will be admitted to obs  Telemetry.  Neurology recommended EEG   Milton Ferguson, MD 03/26/16 2328

## 2016-03-27 ENCOUNTER — Observation Stay (HOSPITAL_BASED_OUTPATIENT_CLINIC_OR_DEPARTMENT_OTHER)
Admit: 2016-03-27 | Discharge: 2016-03-27 | Disposition: A | Discharge status: Home / Self Care | Payer: Medicare Other | Attending: Internal Medicine | Admitting: Internal Medicine

## 2016-03-27 DIAGNOSIS — R569 Unspecified convulsions: Secondary | ICD-10-CM

## 2016-03-27 DIAGNOSIS — Z95810 Presence of automatic (implantable) cardiac defibrillator: Secondary | ICD-10-CM | POA: Diagnosis not present

## 2016-03-27 DIAGNOSIS — E784 Other hyperlipidemia: Secondary | ICD-10-CM | POA: Diagnosis not present

## 2016-03-27 DIAGNOSIS — R410 Disorientation, unspecified: Secondary | ICD-10-CM | POA: Diagnosis not present

## 2016-03-27 LAB — MRSA PCR SCREENING: MRSA BY PCR: NEGATIVE

## 2016-03-27 MED ORDER — LEVETIRACETAM 500 MG/5ML IV SOLN
500.0000 mg | Freq: Two times a day (BID) | INTRAVENOUS | Status: DC
  Administered 2016-03-27 – 2016-03-28 (×2): 500 mg via INTRAVENOUS
  Filled 2016-03-27 (×4): qty 5

## 2016-03-27 NOTE — Procedures (Signed)
ELECTROENCEPHALOGRAM REPORT  Date of Study: 03/27/2016  Patient's Name: Grant Lara MRN: [id number] Date of Birth: 1940/09/13  Referring Provider: Dr. Jennette Kettle  Clinical History: This is a 75 year old man with an episode of staring and unresponsiveness.   Medications: acetaminophen (TYLENOL) tablet 1,000 mg  ALPRAZolam (XANAX) tablet 0.5 mg  aspirin chewable tablet 81 mg  atorvastatin (LIPITOR) tablet 10 mg donepezil (ARICEPT) tablet 10 mg  feeding supplement (ENSURE ENLIVE) (ENSURE ENLIVE) liquid 237 mL  latanoprost (XALATAN) 0.005 % ophthalmic solution 1 drop  levalbuterol (XOPENEX) nebulizer solution 0.63 mg  rivaroxaban (XARELTO) tablet 20 mg  senna (SENOKOT) tablet 8.6 mg  sertraline (ZOLOFT) tablet 50 mg  tamsulosin (FLOMAX) capsule 0.4 mg   Technical Summary: A multichannel digital EEG recording measured by the international 10-20 system with electrodes applied with paste and impedances below 5000 ohms performed as portable with EKG monitoring in an awake and drowsy patient.  Hyperventilation and photic stimulation were not performed.  The digital EEG was referentially recorded, reformatted, and digitally filtered in a variety of bipolar and referential montages for optimal display.   Description: The patient is awake and drowsy during the recording.  During brief period of  maximal wakefulness, there is a poorly sustained symmetric, medium voltage 7 Hz posterior dominant rhythm that attenuates with eye opening. This is admixed with a small amount of diffuse 4-5 Hz theta and 2-3 Hz delta slowing of the waking background. There is additional focal 3-4 Hz slowing seen over the left temporal region. During drowsiness, there is an increase in theta and delta slowing of the background.  Normal sleep architecture is not seen. Hyperventilation and photic stimulation were not performed.  There were rare broad sharp waves over the left temporal region. No electrographic seizures  seen.    EKG lead was unremarkable.  Impression: This awake and drowsy EEG is abnormal due to the presence of: 1. Mild diffuse slowing of the waking background 2. Focal slowing over the left temporal region 3. Rare sharp waves over the left temporal region  Clinical Correlation of the above findings indicates diffuse cerebral dysfunction that is non-specific in etiology and can be seen with hypoxic/ischemic injury, toxic/metabolic encephalopathies, neurodegenerative disorders, or medication effect. Focal slowing over the left temporal region indicates focal cerebral dysfunction in this region suggestive of underlying structural or physiologic abnormality. There is a possible tendency for seizures to arise from this region. There were no electrographic seizures in this study. Clinical correlation is advised.   Ellouise Newer, M.D.

## 2016-03-27 NOTE — Care Management Obs Status (Signed)
[place] NOTIFICATION   Patient Details  Name: Grant Lara MRN: [id number] Date of Birth: [birth year]   Medicare Observation Status Notification Given:  Yes    Pollie Friar, RN 03/27/2016, 1:44 PM

## 2016-03-27 NOTE — ED Notes (Signed)
IV team at bedside 

## 2016-03-27 NOTE — Consult Note (Signed)
NEURO HOSPITALIST CONSULT NOTE   Requestig physician: Dr.  Armida Sans   Reason for Consult: seizure   History obtained from:    Patient and Chart     HPI:                                                                                                                                          Grant Lara is an 75 y.o. male with medical history significant of CAD, CHF, stroke, dementia, AICD placement in 2016.  Patient presents to the ED after a 2 min duration "staring" episode of unresponsiveness that occurred earlier this evening at his SNF.  Reported drooling during episode, no seizure like activity.  Patient then returned to baseline.    EEG shows:  Mild diffuse slowing of the waking background,  Focal slowing over the left temporal region,  Rare sharp waves over the left temporal region  Past Medical History:  Diagnosis Date  . AICD (automatic cardioverter/defibrillator) present   . Arthritis    "used to have a touch in my legs"  . Bradycardia 06/01/2014  . CAD (coronary artery disease)    LHC 9/05 with Dr. Einar Gip:  dLM 20-30%, LAD 85%, oD1 20-30%.  PCI:  Taxus DES to LAD; Dx jailed and tx with POBA.  Last myoview 12/10: inf scar, no ischemia, EF 29%.  . CHF (congestive heart failure) ([place])   . Crohn's disease ([place])   . Dementia   . Diabetes mellitus    11/05/11 "borderline; don't take medications"  . Dilated cardiomyopathy ([place])    2/12: EF 30-35%, trivial AI, mild RAE.  EF 2014 40 -45%  . DVT (deep venous thrombosis) ([place])   . HCAP (healthcare-associated pneumonia) 09/29/2015  . HLD (hyperlipidemia)   . 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 Hyperlipidemia   . Hypertension   . Memory difficulties   . Nephrolithiasis   . PFO (patent foramen ovale)    Not mentioned on 2014 echo.  . Pneumonia ~ 2011   09-22-13 denies any recent SOB or breathing problems  . Pulmonary embolus (HCC)    chronic coumadin  . Stroke [facility]) 1993   "left arm can't hold steady; leg too"  .  Swelling of both ankles     09-22-13 occ.feet, but denies pain.    Past Surgical History:  Procedure Laterality Date  . APPENDECTOMY    . COLON SURGERY  1994; 1996   "for Crohn's disease"  . IMPLANTABLE CARDIOVERTER DEFIBRILLATOR IMPLANT N/A 06/02/2014   Procedure: IMPLANTABLE CARDIOVERTER DEFIBRILLATOR IMPLANT;  Surgeon: [name], MD;  Location: [facility] CATH LAB;  Service: Cardiovascular;  Laterality: N/A;    Family History  Problem Relation Age of Onset  . Diabetes type II Mother   . CAD Father   . Diabetes  type II Brother       Social History:  reports that he has been smoking Cigarettes.  He has a 26.50 pack-year smoking history. He has never used smokeless tobacco. He reports that he does not drink alcohol or use drugs.  Allergies  Allergen Reactions  . Tuberculin Tests     Per MAR (no description provided)    MEDICATIONS:                                                                                                                     Prior to Admission:  Prescriptions Prior to Admission  Medication Sig Dispense Refill Last Dose  . acetaminophen (TYLENOL) 500 MG tablet Take 1,000 mg by mouth daily.    03/26/2016 at 1000  . ALPRAZolam (XANAX) 0.5 MG tablet Take 1 tablet (0.5 mg total) by mouth at bedtime as needed. 15 tablet 0  at prn  . aspirin 81 MG tablet Take 81 mg by mouth daily.   03/26/2016 at 0800  . atorvastatin (LIPITOR) 10 MG tablet Take 1 tablet (10 mg total) by mouth daily at 6 PM. 30 tablet 0 03/25/2016 at 1800  . donepezil (ARICEPT) 10 MG tablet Take 10 mg by mouth at bedtime.    03/25/2016 at 2000  . feeding supplement, ENSURE ENLIVE, (ENSURE ENLIVE) LIQD Take 237 mLs by mouth 2 (two) times daily between meals. 237 mL 12 03/26/2016 at 1200  . latanoprost (XALATAN) 0.005 % ophthalmic solution Place 1 drop into both eyes at bedtime.   03/25/2016 at 2000  . levalbuterol (XOPENEX) 0.63 MG/3ML nebulizer solution Take 3 mLs (0.63 mg total) by nebulization every  6 (six) hours as needed for wheezing or shortness of breath. 3 mL 12  at prn  . rivaroxaban (XARELTO) 20 MG TABS tablet Take 1 tablet (20 mg total) by mouth daily with supper. 30 tablet  03/26/2016 at 0800  . senna (SENOKOT) 8.6 MG TABS tablet Take 1 tablet by mouth every morning.   03/26/2016 at 0800  . sertraline (ZOLOFT) 50 MG tablet Take 50 mg by mouth every morning.    03/26/2016 at 0800  . tamsulosin (FLOMAX) 0.4 MG CAPS capsule Take 1 capsule (0.4 mg total) by mouth daily. 30 capsule 0 03/26/2016 at 0800   Scheduled: . acetaminophen  1,000 mg Oral Daily  . aspirin  81 mg Oral Daily  . atorvastatin  10 mg Oral q1800  . donepezil  10 mg Oral QHS  . feeding supplement (ENSURE ENLIVE)  237 mL Oral BID BM  . latanoprost  1 drop Both Eyes QHS  . rivaroxaban  20 mg Oral Q supper  . senna  1 tablet Oral q morning - 10a  . sertraline  50 mg Oral Daily  . tamsulosin  0.4 mg Oral Daily     ROS:  History obtained from the patient  General ROS: negative for - chills, fatigue, fever, night sweats, weight gain or weight loss Psychological ROS: negative for - behavioral disorder, hallucinations, memory difficulties, mood swings or suicidal ideation Ophthalmic ROS: negative for - blurry vision, double vision, eye pain or loss of vision ENT ROS: negative for - epistaxis, nasal discharge, oral lesions, sore throat, tinnitus or vertigo Allergy and Immunology ROS: negative for - hives or itchy/watery eyes Hematological and Lymphatic ROS: negative for - bleeding problems, bruising or swollen lymph nodes Endocrine ROS: negative for - galactorrhea, hair pattern changes, polydipsia/polyuria or temperature intolerance Respiratory ROS: negative for - cough, hemoptysis, shortness of breath or wheezing Cardiovascular ROS: negative for - chest pain, dyspnea on exertion, edema  or irregular heartbeat Gastrointestinal ROS: negative for - abdominal pain, diarrhea, hematemesis, nausea/vomiting or stool incontinence Genito-Urinary ROS: negative for - dysuria, hematuria, incontinence or urinary frequency/urgency Musculoskeletal ROS: negative for - joint swelling or muscular weakness Neurological ROS: as noted in HPI Dermatological ROS: negative for rash and skin lesion changes   Blood pressure 128/73, pulse 60, temperature 97.9 F (36.6 C), temperature source Oral, resp. rate 18, height 5' 9"  (1.753 m), weight 59.3 kg (130 lb 11.2 oz), SpO2 100 %.   Neurologic Examination:                                                                                                      HEENT-  Normocephalic, no lesions, without obvious abnormality.  Normal external eye and conjunctiva.  Normal TM's bilaterally.  Normal auditory canals and external ears. Normal external nose, mucus membranes and septum.  Normal pharynx. Cardiovascular- S1, S2 normal, pulses palpable throughout   Lungs- chest clear, no wheezing, rales, normal symmetric air entry Abdomen- normal findings: bowel sounds normal Extremities- no edema Lymph-no adenopathy palpable Musculoskeletal-no joint tenderness, deformity or swelling Skin-warm and dry, no hyperpigmentation, vitiligo, or suspicious lesions  Neurological Examination Mental Status: Follow minimal commands such as lifting arms and looking to left and right. He is unaware of where he is. During exam he gets frustrated and then agitated Cranial Nerves: II: Blinks to threat bilaterally pupils equal, round, reactive to light and accommodation III,IV, VI: ptosis not present, extra-ocular motions intact bilaterally V,VII: Face symmetric, facial light touch sensation normal bilaterally VIII: hearing normal bilaterally IX,X: uvula rises symmetrically XI: bilateral shoulder shrug XII: midline tongue extension Motor: Moving all extremities  antigravity Sensory: Withdraws from noxious stimuli Deep Tendon Reflexes: 2+ and symmetric throughout Plantars: Right: downgoing   Left: downgoing Cerebellar: Would not take part in exam Gait: Was not tested      Lab Results: Basic Metabolic Panel:  Recent Labs Lab 03/26/16 2008  NA 138  K 4.2  CL 107  CO2 23  GLUCOSE 136*  BUN 16  CREATININE 1.45*  CALCIUM 9.3    Liver Function Tests:  Recent Labs Lab 03/26/16 2008  AST 25  ALT 30  ALKPHOS 91  BILITOT 0.7  PROT 7.1  ALBUMIN 3.2*   No results for input(s): LIPASE, AMYLASE in the last 168 hours.  No results for input(s): AMMONIA in the last 168 hours.  CBC:  Recent Labs Lab 03/26/16 2008  WBC 11.4*  NEUTROABS 9.1*  HGB 10.0*  HCT 34.5*  MCV 71.7*  PLT 216    Cardiac Enzymes: No results for input(s): CKTOTAL, CKMB, CKMBINDEX, TROPONINI in the last 168 hours.  Lipid Panel: No results for input(s): CHOL, TRIG, HDL, CHOLHDL, VLDL, LDLCALC in the last 168 hours.  CBG: No results for input(s): GLUCAP in the last 168 hours.  Microbiology: Results for orders placed or performed during the hospital encounter of 03/26/16  MRSA PCR Screening     Status: None   Collection Time: 03/27/16  1:21 AM  Result Value Ref Range Status   MRSA by PCR NEGATIVE NEGATIVE Final    Comment:        The GeneXpert MRSA Assay (FDA approved for NASAL specimens only), is one component of a comprehensive MRSA colonization surveillance program. It is not intended to diagnose MRSA infection nor to guide or monitor treatment for MRSA infections.     Coagulation Studies: No results for input(s): LABPROT, INR in the last 72 hours.  Imaging: Ct Head Wo Contrast  Result Date: 03/26/2016 CLINICAL DATA:  Altered mental status EXAM: CT HEAD WITHOUT CONTRAST TECHNIQUE: Contiguous axial images were obtained from the base of the skull through the vertex without intravenous contrast. COMPARISON:  09/29/2015 FINDINGS: Brain:  Prior left peripheral cerebellar infarct with small lacunar infarct in the right posterior cerebellum, stable. Prior left PCA distribution infarct with prior suspected watershed infarcts, unchanged from prior. Stable lacunar infarcts in the right thalamus and left lentiform nuclei. Periventricular white matter and corona radiata hypodensities favor chronic ischemic microvascular white matter disease. Vascular: Unremarkable Skull: Unremarkable Sinuses/Orbits: Unremarkable Other: Calcified transverse ligament at C1. IMPRESSION: 1. Old cerebral and cerebellar infarcts without acute findings. 2. Periventricular white matter and corona radiata hypodensities favor chronic ischemic microvascular white matter disease. Electronically Signed   By: Van Clines M.D.   On: 03/26/2016 21:35       Assessment and plan per attending neurologist  Etta Quill PA-C [facility] [phone]  03/27/2016, 11:40 AM   Assessment/Plan: New onset seizure with known intercranial risk factors such as dementia and stroke. At this time would recommend starting Keppra 500 mg BID. Per [facility] statutes, patients with seizures are not allowed to drive until  they have been seizure-free for six months. Use caution when using heavy equipment or power tools. Avoid working on ladders or at heights. Take showers instead of baths. Ensure the water temperature is not too high on the home water heater. Do not go swimming alone. When caring for infants or small children, sit down when holding, feeding, or changing them to minimize risk of injury to the child in the event you have a seizure.

## 2016-03-27 NOTE — NC FL2 (Signed)
[place] LEVEL OF CARE SCREENING TOOL     IDENTIFICATION  Patient Name: Grant Lara Birthdate: 10-Mar-1941 Sex: male Admission Date (Current Location): 03/26/2016  [facility] and [place] Number:  Herbalist and Address:  [facility] [place], [place] [address], [place], [place] [address]      Provider Number: [id number]  Attending Physician Name and Address:  Mendel Corning, MD  Relative Name and Phone Number:       Current Level of Care: Hospital Recommended Level of Care: [place] Prior Approval Number:    Date Approved/Denied:   PASRR Number: [id number]  Discharge Plan: SNF    Current Diagnoses: Patient Active Problem List   Diagnosis Date Noted  . Confusion 03/26/2016  . Protein-calorie malnutrition, moderate ([place]) 09/30/2015  . DM type 2 causing CKD stage 3 ([place]) 09/30/2015  . ICD (implantable cardioverter-defibrillator) in place 09/29/2015  . Vascular dementia without behavioral disturbance 09/25/2015  . Anemia 09/08/2015  . OA (osteoarthritis) 06/06/2015  . Bilateral lower extremity pain 05/14/2015  . BPH (benign prostatic hyperplasia) 04/10/2015  . FTT (failure to thrive) in adult 11/05/2014  . Protein-calorie malnutrition, severe ([place]) 10/30/2014  . 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 Chronic combined systolic and diastolic CHF (congestive heart failure) ([place])   . Pulmonary embolus ([place]) 07/16/2014  . Late effects of CVA (cerebrovascular accident) 07/16/2014  . Cerebral infarction ([place])   . Hyperlipidemia   . Coronary artery disease involving native coronary artery of native heart without angina pectoris   . CKD (chronic kidney disease) stage 3, GFR 30-59 ml/min 04/16/2014  . Anemia, chronic disease 11/21/2013  . DM2 (diabetes mellitus, type 2) ([place]) 11/20/2013  . Small bowel obstruction 07/13/2013  . Crohn's disease ([place]) 02/16/2012  . DVT (deep venous thrombosis) ([place]) 06/05/2010  . COPD  (chronic obstructive pulmonary disease) ([place]) 04/05/2010  . Hypertensive heart disease with CHF (congestive heart failure) ([place]) 02/10/2009  . TOBACCO ABUSE 12/12/2008  . Dilated cardiomyopathy ([place]) 12/12/2008    Orientation RESPIRATION BLADDER Height & Weight     Self, Place  Normal Continent Weight: 130 lb 11.2 oz (59.3 kg) Height:  5' 9"  (175.3 cm)  BEHAVIORAL SYMPTOMS/MOOD NEUROLOGICAL BOWEL NUTRITION STATUS      Continent Diet (Heart Healthy, Thin Liquids)  AMBULATORY STATUS COMMUNICATION OF NEEDS Skin   Limited Assist Verbally Normal                       Personal Care Assistance Level of Assistance  Bathing, Feeding, Dressing Bathing Assistance: Limited assistance Feeding assistance: Independent Dressing Assistance: Limited assistance     Functional Limitations Info  Sight, Hearing, Speech Sight Info: Adequate Hearing Info: Adequate Speech Info: Adequate    SPECIAL CARE FACTORS FREQUENCY  PT (By licensed PT), OT (By licensed OT)     PT Frequency: 5 OT Frequency: 5            Contractures Contractures Info: Not present    Additional Factors Info  Code Status, Allergies Code Status Info: Full Code Allergies Info: Tuberculin Tests           Current Medications (03/27/2016):  This is the current hospital active medication list Current Facility-Administered Medications  Medication Dose Route Frequency Provider Last Rate Last Dose  . acetaminophen (TYLENOL) tablet 1,000 mg  1,000 mg Oral Daily Etta Quill, DO   1,000 mg at 03/27/16 4008  . ALPRAZolam (XANAX) tablet 0.5 mg  0.5  mg Oral QHS PRN Etta Quill, DO      . aspirin chewable tablet 81 mg  81 mg Oral Daily Etta Quill, DO   81 mg at 03/27/16 7412  . atorvastatin (LIPITOR) tablet 10 mg  10 mg Oral q1800 Etta Quill, DO      . donepezil (ARICEPT) tablet 10 mg  10 mg Oral QHS Etta Quill, DO   10 mg at 03/27/16 0135  . feeding supplement (ENSURE ENLIVE) (ENSURE ENLIVE) liquid  237 mL  237 mL Oral BID BM Etta Quill, DO      . latanoprost (XALATAN) 0.005 % ophthalmic solution 1 drop  1 drop Both Eyes QHS Etta Quill, DO   1 drop at 03/27/16 0139  . levalbuterol (XOPENEX) nebulizer solution 0.63 mg  0.63 mg Nebulization Q6H PRN Etta Quill, DO      . rivaroxaban (XARELTO) tablet 20 mg  20 mg Oral Q supper Etta Quill, DO      . senna [facility]) tablet 8.6 mg  1 tablet Oral q morning - 10a Etta Quill, DO   8.6 mg at 03/27/16 8786  . sertraline (ZOLOFT) tablet 50 mg  50 mg Oral Daily Etta Quill, DO   50 mg at 03/27/16 7672  . tamsulosin (FLOMAX) capsule 0.4 mg  0.4 mg Oral Daily Etta Quill, DO   0.4 mg at 03/27/16 0947     Discharge Medications: Please see discharge summary for a list of discharge medications.  Relevant Imaging Results:  Relevant Lab Results:   Additional Information SSN:  [id number]  Darden Dates, LCSW

## 2016-03-27 NOTE — Progress Notes (Signed)
EEG Completed; Results Pending  

## 2016-03-27 NOTE — Progress Notes (Signed)
[facility]                                                                              Patient Demographics  Grant Lara, is a 75 y.o. male, DOB - [birth year], RCB:[id number]  Admit date - 03/26/2016   Admitting Physician Etta Quill, DO  Outpatient Primary MD for the patient is [name], MD  Outpatient specialists:   LOS - 0  days    Chief Complaint  Patient presents with  . Altered Mental Status    pt had a brief episodde of where he stared off and would not respond to staff lasting two minutes at around 1800       Brief summary   Grant Lara is a 75 y.o. male with medical history significant of CAD, CHF, stroke, dementia, AICD placement in 2016.  Patient presented to the ED after a 2 min duration "staring" episode of unresponsiveness that occurred his SNF.  Reported drooling during episode, no seizure like activity.  Patient then returned to baseline.    Assessment & Plan    Principal Problem:   Confusion/Staring spell - Unclear etiology if patient had a seizure episode, no repeat seizures since admission - EEG done today showed mild diffuse slowing of the waking background, focal slowing over the left temporal region, rare sharp waves over the left temporal region - Neurology consulted, spoke with Dr. Aram Beecham. Per admitting physician, patient's ICD is MRI compatible, if MRI is recommended by neurology. Will follow neurology recommendations  Active Problems:   ICD (implantable cardioverter-defibrillator) in place - ICD has been interrogated, device is functioning as pacemaker only  CAD -Currently stable, no chest pain or shortness of breath - Place on aspirin, Lipitor,  Hyperlipidemia Continue Lipitor   BPH - Continue Flomax   Dementia - Currently stable, continue Aricept, Xanax, Zoloft   History of pulmonary embolus, DVT  - Continue xarelto   Code Status: full  DVT Prophylaxis: xarelto Family Communication: Discussed in  detail with the patient, all imaging results, lab results explained to the patient. No family member at the bedside    Disposition Plan  Time Spent in minutes 25 minutes  Procedures:  EEG  Consultants :   Neurology  Antimicrobials :      Medications  Scheduled Meds: . acetaminophen  1,000 mg Oral Daily  . aspirin  81 mg Oral Daily  . atorvastatin  10 mg Oral q1800  . donepezil  10 mg Oral QHS  . feeding supplement (ENSURE ENLIVE)  237 mL Oral BID BM  . latanoprost  1 drop Both Eyes QHS  . rivaroxaban  20 mg Oral Q supper  . senna  1 tablet Oral q morning - 10a  . sertraline  50 mg Oral Daily  . tamsulosin  0.4 mg Oral Daily   Continuous Infusions: PRN Meds:.ALPRAZolam, levalbuterol   Antibiotics   Anti-infectives    None        Subjective:   Grant Lara was seen and examined today.  Denies any specific complaints, eating breakfast at the time of my encounter. Patient denies dizziness, chest pain, shortness of breath, abdominal  pain, N/V/D/C, new weakness, numbess, tingling. No acute events overnight.    Objective:   Vitals:   03/26/16 2330 03/26/16 2358 03/27/16 0046 03/27/16 0529  BP: 112/71  111/62 128/73  Pulse: (!) 59  60 60  Resp: 16  18 18   Temp:  97.8 F (36.6 C) 98 F (36.7 C) 97.9 F (36.6 C)  TempSrc:  Oral Oral Oral  SpO2: 100%  100% 100%  Weight:   59.3 kg (130 lb 11.2 oz)   Height:   5' 9"  (1.753 m)    No intake or output data in the 24 hours ending 03/27/16 1124   Wt Readings from Last 3 Encounters:  03/27/16 59.3 kg (130 lb 11.2 oz)  03/12/16 57.2 kg (126 lb)  02/13/16 58.5 kg (129 lb)     Exam  General: Alert and oriented x 2, NAD, comfortable  HEENT:  PERRLA, EOMI, Anicteric Sclera, mucous membranes moist.   Neck: Supple, no JVD  Cardiovascular: S1 S2 auscultated, no rubs, murmurs or gallops. Regular rate and rhythm.  Respiratory: Clear to auscultation bilaterally, no wheezing, rales or rhonchi  Gastrointestinal:  Soft, nontender, nondistended, + bowel sounds  Ext: no cyanosis clubbing or edema  Neuro:  Strength 5/5 upper and lower extremities bilaterally  Skin: No rashes  Psych: Normal affect and demeanor, alert and oriented x2     Data Reviewed:  I have personally reviewed following labs and imaging studies  Micro Results Recent Results (from the past 240 hour(s))  MRSA PCR Screening     Status: None   Collection Time: 03/27/16  1:21 AM  Result Value Ref Range Status   MRSA by PCR NEGATIVE NEGATIVE Final    Comment:        The GeneXpert MRSA Assay (FDA approved for NASAL specimens only), is one component of a comprehensive MRSA colonization surveillance program. It is not intended to diagnose MRSA infection nor to guide or monitor treatment for MRSA infections.     Radiology Reports Ct Head Wo Contrast  Result Date: 03/26/2016 CLINICAL DATA:  Altered mental status EXAM: CT HEAD WITHOUT CONTRAST TECHNIQUE: Contiguous axial images were obtained from the base of the skull through the vertex without intravenous contrast. COMPARISON:  09/29/2015 FINDINGS: Brain: Prior left peripheral cerebellar infarct with small lacunar infarct in the right posterior cerebellum, stable. Prior left PCA distribution infarct with prior suspected watershed infarcts, unchanged from prior. Stable lacunar infarcts in the right thalamus and left lentiform nuclei. Periventricular white matter and corona radiata hypodensities favor chronic ischemic microvascular white matter disease. Vascular: Unremarkable Skull: Unremarkable Sinuses/Orbits: Unremarkable Other: Calcified transverse ligament at C1. IMPRESSION: 1. Old cerebral and cerebellar infarcts without acute findings. 2. Periventricular white matter and corona radiata hypodensities favor chronic ischemic microvascular white matter disease. Electronically Signed   By: Van Clines M.D.   On: 03/26/2016 21:35    Lab Data:  CBC:  Recent Labs Lab  03/26/16 2008  WBC 11.4*  NEUTROABS 9.1*  HGB 10.0*  HCT 34.5*  MCV 71.7*  PLT 161   Basic Metabolic Panel:  Recent Labs Lab 03/26/16 2008  NA 138  K 4.2  CL 107  CO2 23  GLUCOSE 136*  BUN 16  CREATININE 1.45*  CALCIUM 9.3   GFR: Estimated Creatinine Clearance: 35.8 mL/min (by C-G formula based on SCr of 1.45 mg/dL (H)). Liver Function Tests:  Recent Labs Lab 03/26/16 2008  AST 25  ALT 30  ALKPHOS 91  BILITOT 0.7  PROT 7.1  ALBUMIN 3.2*  No results for input(s): LIPASE, AMYLASE in the last 168 hours. No results for input(s): AMMONIA in the last 168 hours. Coagulation Profile: No results for input(s): INR, PROTIME in the last 168 hours. Cardiac Enzymes: No results for input(s): CKTOTAL, CKMB, CKMBINDEX, TROPONINI in the last 168 hours. BNP (last 3 results) No results for input(s): PROBNP in the last 8760 hours. HbA1C: No results for input(s): HGBA1C in the last 72 hours. CBG: No results for input(s): GLUCAP in the last 168 hours. Lipid Profile: No results for input(s): CHOL, HDL, LDLCALC, TRIG, CHOLHDL, LDLDIRECT in the last 72 hours. Thyroid Function Tests: No results for input(s): TSH, T4TOTAL, FREET4, T3FREE, THYROIDAB in the last 72 hours. Anemia Panel: No results for input(s): VITAMINB12, FOLATE, FERRITIN, TIBC, IRON, RETICCTPCT in the last 72 hours. Urine analysis:    Component Value Date/Time   COLORURINE YELLOW 10/06/2015 [place] 10/06/2015 1204   LABSPEC 1.013 10/06/2015 1204   PHURINE 6.5 10/06/2015 1204   GLUCOSEU NEGATIVE 10/06/2015 1204   HGBUR NEGATIVE 10/06/2015 1204   BILIRUBINUR NEGATIVE 10/06/2015 1204   KETONESUR NEGATIVE 10/06/2015 1204   PROTEINUR NEGATIVE 10/06/2015 1204   UROBILINOGEN 1.0 12/21/2014 1520   NITRITE NEGATIVE 10/06/2015 1204   LEUKOCYTESUR NEGATIVE 10/06/2015 1204     RAI,RIPUDEEP M.D. [facility] 03/27/2016, 11:24 AM  Pager: [phone] Between 7am to 7pm - call Pager -  [phone]  After 7pm go to [email or web address] - password [email or web address]  Call night coverage person covering after 7pm

## 2016-03-27 NOTE — Discharge Instructions (Signed)

## 2016-03-27 NOTE — Care Management Note (Signed)
Case Management Note  Patient Details  Name: Grant Lara MRN: [id number] Date of Birth: [birth year]  Subjective/Objective:   Pt in with confusion. He is from [facility].                  Action/Plan: Plan is for patient to return to [facility] when medically ready. CM following.   Expected Discharge Date:                  Expected Discharge Plan:  [place]  In-House Referral:     Discharge planning Services     Post Acute Care Choice:    Choice offered to:     DME Arranged:    DME Agency:     HH Arranged:    [place] Agency:     Status of Service:  In process, will continue to follow  If discussed at Long Length of Stay Meetings, dates discussed:    Additional Comments:  Pollie Friar, RN 03/27/2016, 11:24 AM

## 2016-03-27 NOTE — Clinical Social Work Note (Signed)
Clinical Social Work Assessment  Patient Details  Name: Grant Lara MRN: [id number] Date of Birth: December 21, 1940  Date of referral:  03/27/16               Reason for consult:  Facility Placement, Discharge Planning                Permission sought to share information with:  Family Supports Permission granted to share information::     Name::     Grant Lara  Agency::     Relationship::  wife  Contact Information:  [phone]  Housing/Transportation Living arrangements for the past 2 months:  [place] of Information:  Spouse Patient Interpreter Needed:  None Criminal Activity/Legal Involvement Pertinent to Current Situation/Hospitalization:  No - Comment as needed Significant Relationships:  Adult Children, Spouse Lives with:  Facility Resident Do you feel safe going back to the place where you live?  Yes Need for family participation in patient care:  Yes (Comment)  Care giving concerns:  No care giving concerns identifiefd.   Social Worker assessment / plan:  CSW spoke with pt's wife, as pt has dementia. CSW introduced herself and explained role of social work. Pt is a LTC resident of [facility]. Per pt's wife he has been there for about a year. Pt's wife is agreeable to pt returning at discharge. Per facility, pt is able to return when stable. CSW completed FL2. CSW will continue to follow.   Employment status:  Retired Nurse, adult PT Recommendations:  Not assessed at this time Information / Referral to community resources:   Personal assistant)  Patient/Family's Response to care:  Pt's wife was appreciative of CSW support.  Patient/Family's Understanding of and Emotional Response to Diagnosis, Current Treatment, and Prognosis:  Pt's wife understands that pt is to return to facility at discharge.   Emotional Assessment Appearance:  Other (Comment Required Attitude/Demeanor/Rapport:  Other Affect (typically observed):   Other Orientation:  Oriented to Self, Oriented to Place Alcohol / Substance use:  Not Applicable Psych involvement (Current and /or in the community):  No (Comment)  Discharge Needs  Concerns to be addressed:  Adjustment to Illness Readmission within the last 30 days:  No Current discharge risk:  Chronically ill Barriers to Discharge:  Continued Medical Work up   Darden Dates, LCSW 03/27/2016, 2:32 PM

## 2016-03-27 NOTE — ED Provider Notes (Signed)
12:30 AM Report received via phone from [employer]. [employer] reports that from 10/05/2015 until today there have been no episodes of defibrillation; however, the detections for high-powered therapy have been turned off. This means that the patient will continue to be paced if needed, but will not be shocked. The device is functioning as a PACEMAKER ONLY. Even if the patient did have criteria for shocking, there are no detections for this given current settings. Patient has been in atrial paced approximately 22% of the time. Formal report to be faxed by [employer].   Antonietta Breach, PA-C 03/27/16 1031    Milton Ferguson, MD 03/27/16 [phone]

## 2016-03-28 DIAGNOSIS — Z95810 Presence of automatic (implantable) cardiac defibrillator: Secondary | ICD-10-CM | POA: Diagnosis not present

## 2016-03-28 DIAGNOSIS — R410 Disorientation, unspecified: Secondary | ICD-10-CM

## 2016-03-28 LAB — BASIC METABOLIC PANEL
Anion gap: 7 (ref 5–15)
BUN: 20 mg/dL (ref 6–20)
CO2: 26 mmol/L (ref 22–32)
CREATININE: 1.43 mg/dL — AB (ref 0.61–1.24)
Calcium: 9.4 mg/dL (ref 8.9–10.3)
Chloride: 105 mmol/L (ref 101–111)
GFR calc Af Amer: 53 mL/min — ABNORMAL LOW (ref >60)
GFR, EST NON AFRICAN AMERICAN: 46 mL/min — AB (ref >60)
GLUCOSE: 114 mg/dL — AB (ref 65–99)
Potassium: 4.2 mmol/L (ref 3.5–5.1)
SODIUM: 138 mmol/L (ref 135–145)

## 2016-03-28 MED ORDER — LEVETIRACETAM 500 MG PO TABS: 500.0000 mg | ORAL_TABLET | Freq: Two times a day (BID) | ORAL | 0 refills | Status: AC

## 2016-03-28 NOTE — Progress Notes (Signed)
NEURO HOSPITALIST PROGRESS NOTE   SUBJECTIVE:                                                                                                                        No new neurological developments. Patient has no new complains today.  Started on low dose keppra without noticeable side effects so far.   OBJECTIVE:                                                                                                                           Vital signs in last 24 hours: Temp:  [97.7 F (36.5 C)-98.4 F (36.9 C)] 97.7 F (36.5 C) (12/14 0854) Pulse Rate:  [59-73] 59 (12/14 0854) Resp:  [16-18] 16 (12/14 0854) BP: (105-130)/(55-73) 130/69 (12/14 0854) SpO2:  [96 %-100 %] 100 % (12/14 0854)  Intake/Output from previous day: No intake/output data recorded. Intake/Output this shift: No intake/output data recorded. Nutritional status: Diet Heart Room service appropriate? Yes; Fluid consistency: Thin  Past Medical History:  Diagnosis Date  . AICD (automatic cardioverter/defibrillator) present   . Arthritis    "used to have a touch in my legs"  . Bradycardia 06/01/2014  . CAD (coronary artery disease)    LHC 9/05 with Dr. Einar Gip:  dLM 20-30%, LAD 85%, oD1 20-30%.  PCI:  Taxus DES to LAD; Dx jailed and tx with POBA.  Last myoview 12/10: inf scar, no ischemia, EF 29%.  . CHF (congestive heart failure) ([place])   . Crohn's disease ([place])   . Dementia   . Diabetes mellitus    11/05/11 "borderline; don't take medications"  . Dilated cardiomyopathy ([place])    2/12: EF 30-35%, trivial AI, mild RAE.  EF 2014 40 -45%  . DVT (deep venous thrombosis) ([place])   . HCAP (healthcare-associated pneumonia) 09/29/2015  . HLD (hyperlipidemia)   . 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 Hyperlipidemia   . Hypertension   . Memory difficulties   . Nephrolithiasis   . PFO (patent foramen ovale)    Not mentioned on 2014 echo.  . Pneumonia ~ 2011   09-22-13 denies any recent SOB or  breathing problems  . Pulmonary embolus (HCC)    chronic coumadin  . Stroke [facility]) 1993   "left  arm can't hold steady; leg too"  . Swelling of both ankles     09-22-13 occ.feet, but denies pain.    Neurologic ROS negative with exception of above. Musculoskeletal ROS: no joint pain or swelling.   Physical exam:  Constitutional: well developed, pleasant male in no apparent distress. Eyes: no jaundice or exophthalmos.  Head: normocephalic. Neck: supple, no bruits, no JVD. Cardiac: no murmurs. Lungs: clear. Abdomen: soft, no tender, no mass. Extremities: no edema, clubbing, or cyanosis.  Skin: no rash   Neurologic Exam:  Mental Status: Alert and awake, oriented to place and year, follows simple commands. No dysarthria or aphasia. Cranial Nerves: II: Blinks to threat bilaterally pupils equal, round, reactive to light and accommodation III,IV, VI: ptosis not present, extra-ocular motions intact bilaterally V,VII: Face symmetric, facial light touch sensation normal bilaterally VIII: hearing normal bilaterally IX,X: uvula rises symmetrically XI: bilateral shoulder shrug XII: midline tongue extension Motor: Moving all extremities antigravity Sensory: Withdraws from noxious stimuli Deep Tendon Reflexes: 2+ and symmetric throughout Plantars: Right: downgoing                                Left: downgoing Cerebellar: Would not take part in exam Gait: Was not tested  Lab Results: Lab Results  Component Value Date/Time   CHOL 135 09/15/2015   Lipid Panel No results for input(s): CHOL, TRIG, HDL, CHOLHDL, VLDL, LDLCALC in the last 72 hours.  Studies/Results: Ct Head Wo Contrast  Result Date: 03/26/2016 CLINICAL DATA:  Altered mental status EXAM: CT HEAD WITHOUT CONTRAST TECHNIQUE: Contiguous axial images were obtained from the base of the skull through the vertex without intravenous contrast. COMPARISON:  09/29/2015 FINDINGS: Brain: Prior left peripheral cerebellar infarct  with small lacunar infarct in the right posterior cerebellum, stable. Prior left PCA distribution infarct with prior suspected watershed infarcts, unchanged from prior. Stable lacunar infarcts in the right thalamus and left lentiform nuclei. Periventricular white matter and corona radiata hypodensities favor chronic ischemic microvascular white matter disease. Vascular: Unremarkable Skull: Unremarkable Sinuses/Orbits: Unremarkable Other: Calcified transverse ligament at C1. IMPRESSION: 1. Old cerebral and cerebellar infarcts without acute findings. 2. Periventricular white matter and corona radiata hypodensities favor chronic ischemic microvascular white matter disease. Electronically Signed   By: Van Clines M.D.   On: 03/26/2016 21:35    MEDICATIONS                                                                                                                       I have reviewed the patient's current medications.  ASSESSMENT/PLAN:  75 y.o. male with medical history significant of CAD, CHF, stroke, dementia, AICD placement in 2016. Patient presents to the ED after a 2 min duration "staring" episode of unresponsiveness that occurred earlier this evening at his SNF. Reported drooling during episode, no seizure like activity. Patient then returned to baseline.  EEG shows:  Milddiffuse slowing of the waking background,  Focal slowing over the left temporal region,  Rare sharp waves over the left temporal region. 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 Continue current dose keppra and adjust as outpatient. Neurology will sign off.  Dorian Pod, MD [facility] [phone]  03/28/2016, 1:32 PM

## 2016-03-28 NOTE — Clinical Social Work Note (Signed)
Pt is ready for discharge today and will return to [place]. Pt's wife is aware and agreeable to discharge plan. Facility has received discharge information and ready to admit pt. RN has called report. [facility] will provide transportation. CSW is signing off no further needs identified.   Darden Dates, MSW, LCSW  Clinical Social Worker  [phone]

## 2016-03-28 NOTE — Discharge Summary (Signed)
Physician Discharge Summary  Grant Lara QZE:[id number] DOB: Dec 05, 1940 DOA: 03/26/2016  PCP: [name], MD  Admit date: 03/26/2016 Discharge date: 03/28/2016  Admitted From: SNF Disposition:  SNF.  Recommendations for Outpatient Follow-up:  1. Follow up with PCP in 1-2 weeks 2. Please obtain BMP/CBC in one week 3. Please follow up WITH neurology in 1 month    Discharge Condition:stable.  CODE STATUS:full code.  Diet recommendation:Regular   Brief/Interim Summary: Grant Lara a 75 y.o.malewith medical history significant of CAD, CHF, stroke, dementia, AICD placement in 2016. Patient presented to the ED after a 2 min duration "staring" episode of unresponsiveness that occurred his SNF. Reported drooling during episode, no seizure like activity. Patient then returned to baseline.  Discharge Diagnoses:  Principal Problem:   Confusion Active Problems:   ICD (implantable cardioverter-defibrillator) in place  Confusion/Staring spell - possibly a seizure  - EEG done  showed mild diffuse slowing of the waking background, focal slowing over the left temporal region, rare sharp waves over the left temporal region - Neurology consulted, spoke with Dr. 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 - he was able to tolerate keppra 500 mg , will discharge him on the same and outpatient follow up with neurology.   Active Problems:   ICD (implantable cardioverter-defibrillator) in place - ICD has been interrogated, device is functioning as pacemaker only  CAD -Currently stable, no chest pain or shortness of breath - Place on aspirin, Lipitor,  Hyperlipidemia Continue Lipitor   BPH - Continue Flomax   Dementia - Currently stable, continue Aricept, Xanax, Zoloft   History of pulmonary embolus, DVT  - Continue xarelto    Discharge Instructions  Discharge Instructions    Diet general    Complete by:  As directed    Discharge instructions    Complete by:  As directed    Please follow up with neurology in 4 weeks. Please follow up with PCP in one week, post hospitalization visit.  Per [facility] statutes, patients with seizures are not allowed to drive until  they have been seizure-free for six months. Use caution when using heavy equipment or power tools. Avoid working on ladders or at heights. Take showers instead of baths. Ensure the water temperature is not too high on the home water heater. Do not go swimming alone. When caring for infants or small children, sit down when holding, feeding, or changing them to minimize risk of injury to the child in the event you have a seizure       Medication List    TAKE these medications   acetaminophen 500 MG tablet Commonly known as:  TYLENOL Take 1,000 mg by mouth daily.   ALPRAZolam 0.5 MG tablet Commonly known as:  XANAX Take 1 tablet (0.5 mg total) by mouth at bedtime as needed.   aspirin 81 MG tablet Take 81 mg by mouth daily.   atorvastatin 10 MG tablet Commonly known as:  LIPITOR Take 1 tablet (10 mg total) by mouth daily at 6 PM.   donepezil 10 MG tablet Commonly known as:  ARICEPT Take 10 mg by mouth at bedtime.   feeding supplement (ENSURE ENLIVE) Liqd Take 237 mLs by mouth 2 (two) times daily between meals.   latanoprost 0.005 % ophthalmic solution Commonly known as:  XALATAN Place 1 drop into both eyes at bedtime.  levalbuterol 0.63 MG/3ML nebulizer solution Commonly known as:  XOPENEX Take 3 mLs (0.63 mg total) by nebulization every 6 (six) hours as needed for wheezing or shortness of breath.   levETIRAcetam 500 MG tablet Commonly known as:  KEPPRA Take 1 tablet (500 mg total) by mouth 2 (two) times daily.   rivaroxaban 20 MG Tabs tablet Commonly known as:  XARELTO Take 1 tablet (20 mg total) by mouth daily with  supper.   senna 8.6 MG Tabs tablet Commonly known as:  SENOKOT Take 1 tablet by mouth every morning.   sertraline 50 MG tablet Commonly known as:  ZOLOFT Take 50 mg by mouth every morning.   tamsulosin 0.4 MG Caps capsule Commonly known as:  FLOMAX Take 1 capsule (0.4 mg total) by mouth daily.      Follow-up Information    [name], MD. Schedule an appointment as soon as possible for a visit in 1 week(s).   Specialty:  Family Medicine Why:  post hospitalization visit.  Contact information: [address] [place] [address] [phone]          Allergies  Allergen Reactions  . Tuberculin Tests     Per MAR (no description provided)    Consultations:  Neurology.    Procedures/Studies: Ct Head Wo Contrast  Result Date: 03/26/2016 CLINICAL DATA:  Altered mental status EXAM: CT HEAD WITHOUT CONTRAST TECHNIQUE: Contiguous axial images were obtained from the base of the skull through the vertex without intravenous contrast. COMPARISON:  09/29/2015 FINDINGS: Brain: Prior left peripheral cerebellar infarct with small lacunar infarct in the right posterior cerebellum, stable. Prior left PCA distribution infarct with prior suspected watershed infarcts, unchanged from prior. Stable lacunar infarcts in the right thalamus and left lentiform nuclei. Periventricular white matter and corona radiata hypodensities favor chronic ischemic microvascular white matter disease. Vascular: Unremarkable Skull: Unremarkable Sinuses/Orbits: Unremarkable Other: Calcified transverse ligament at C1. IMPRESSION: 1. Old cerebral and cerebellar infarcts without acute findings. 2. Periventricular white matter and corona radiata hypodensities favor chronic ischemic microvascular white matter disease. Electronically Signed   By: Van Clines M.D.   On: 03/26/2016 21:35    Subjective:  No complaints today.  Discharge Exam: Vitals:   03/28/16 0854 03/28/16 1335  BP:  130/69 118/65  Pulse: (!) 59 (!) 58  Resp: 16 15  Temp: 97.7 F (36.5 C) 97.7 F (36.5 C)   Vitals:   03/28/16 0149 03/28/16 0549 03/28/16 0854 03/28/16 1335  BP: 118/64 120/73 130/69 118/65  Pulse: (!) 59 67 (!) 59 (!) 58  Resp: 18 18 16 15   Temp: 97.7 F (36.5 C) 98.4 F (36.9 C) 97.7 F (36.5 C) 97.7 F (36.5 C)  TempSrc: Oral Oral Oral Oral  SpO2: 97% 98% 100% 99%  Weight:      Height:        General: Pt is alert, awake, not in acute distress Cardiovascular: RRR, S1/S2 +, no rubs, no gallops Respiratory: CTA bilaterally, no wheezing, no rhonchi Abdominal: Soft, NT, ND, bowel sounds + Extremities: no edema, no cyanosis    The results of significant diagnostics from this hospitalization (including imaging, microbiology, ancillary and laboratory) are listed below for reference.     Microbiology: Recent Results (from the past 240 hour(s))  MRSA PCR Screening     Status: None   Collection Time: 03/27/16  1:21 AM  Result Value Ref Range Status   MRSA by PCR NEGATIVE NEGATIVE Final    Comment:  The GeneXpert MRSA Assay (FDA approved for NASAL specimens only), is one component of a comprehensive MRSA colonization surveillance program. It is not intended to diagnose MRSA infection nor to guide or monitor treatment for MRSA infections.      Labs: BNP (last 3 results)  Recent Labs  09/29/15 1210 10/05/15 2257  BNP 88.7 97.2   Basic Metabolic Panel:  Recent Labs Lab 03/26/16 2008  NA 138  K 4.2  CL 107  CO2 23  GLUCOSE 136*  BUN 16  CREATININE 1.45*  CALCIUM 9.3   Liver Function Tests:  Recent Labs Lab 03/26/16 2008  AST 25  ALT 30  ALKPHOS 91  BILITOT 0.7  PROT 7.1  ALBUMIN 3.2*   No results for input(s): LIPASE, AMYLASE in the last 168 hours. No results for input(s): AMMONIA in the last 168 hours. CBC:  Recent Labs Lab 03/26/16 2008  WBC 11.4*  NEUTROABS 9.1*  HGB 10.0*  HCT 34.5*  MCV 71.7*  PLT 216   Cardiac  Enzymes: No results for input(s): CKTOTAL, CKMB, CKMBINDEX, TROPONINI in the last 168 hours. BNP: Invalid input(s): POCBNP CBG: No results for input(s): GLUCAP in the last 168 hours. D-Dimer No results for input(s): DDIMER in the last 72 hours. Hgb A1c No results for input(s): HGBA1C in the last 72 hours. Lipid Profile No results for input(s): CHOL, HDL, LDLCALC, TRIG, CHOLHDL, LDLDIRECT in the last 72 hours. Thyroid function studies No results for input(s): TSH, T4TOTAL, T3FREE, THYROIDAB in the last 72 hours.  Invalid input(s): FREET3 Anemia work up No results for input(s): VITAMINB12, FOLATE, FERRITIN, TIBC, IRON, RETICCTPCT in the last 72 hours. Urinalysis    Component Value Date/Time   COLORURINE YELLOW 10/06/2015 1204   APPEARANCEUR CLEAR 10/06/2015 1204   LABSPEC 1.013 10/06/2015 1204   PHURINE 6.5 10/06/2015 1204   GLUCOSEU NEGATIVE 10/06/2015 1204   HGBUR NEGATIVE 10/06/2015 1204   BILIRUBINUR NEGATIVE 10/06/2015 1204   KETONESUR NEGATIVE 10/06/2015 1204   PROTEINUR NEGATIVE 10/06/2015 1204   UROBILINOGEN 1.0 12/21/2014 1520   NITRITE NEGATIVE 10/06/2015 1204   LEUKOCYTESUR NEGATIVE 10/06/2015 1204   Sepsis Labs Invalid input(s): PROCALCITONIN,  WBC,  LACTICIDVEN Microbiology Recent Results (from the past 240 hour(s))  MRSA PCR Screening     Status: None   Collection Time: 03/27/16  1:21 AM  Result Value Ref Range Status   MRSA by PCR NEGATIVE NEGATIVE Final    Comment:        The GeneXpert MRSA Assay (FDA approved for NASAL specimens only), is one component of a comprehensive MRSA colonization surveillance program. It is not intended to diagnose MRSA infection nor to guide or monitor treatment for MRSA infections.      Time coordinating discharge: Over 30 minutes  SIGNED:   Hosie Poisson, MD  [facility] 03/28/2016, 1:59 PM Pager   If 7PM-7AM, please contact night-coverage [email or web address] Password [email or web address]

## 2016-03-29 ENCOUNTER — Non-Acute Institutional Stay (SKILLED_NURSING_FACILITY): Payer: Medicare Other | Admitting: Adult Health

## 2016-03-29 ENCOUNTER — Encounter: Payer: Self-pay | Admitting: Adult Health

## 2016-03-29 DIAGNOSIS — E1122 Type 2 diabetes mellitus with diabetic chronic kidney disease: Secondary | ICD-10-CM | POA: Diagnosis not present

## 2016-03-29 DIAGNOSIS — I2510 Atherosclerotic heart disease of native coronary artery without angina pectoris: Secondary | ICD-10-CM

## 2016-03-29 DIAGNOSIS — N183 Chronic kidney disease, stage 3 unspecified: Secondary | ICD-10-CM

## 2016-03-29 DIAGNOSIS — F0150 Vascular dementia without behavioral disturbance: Secondary | ICD-10-CM | POA: Diagnosis not present

## 2016-03-29 DIAGNOSIS — R569 Unspecified convulsions: Secondary | ICD-10-CM | POA: Diagnosis not present

## 2016-03-29 DIAGNOSIS — I5042 Chronic combined systolic (congestive) and diastolic (congestive) heart failure: Secondary | ICD-10-CM

## 2016-03-29 DIAGNOSIS — I2782 Chronic pulmonary embolism: Secondary | ICD-10-CM

## 2016-03-29 DIAGNOSIS — J449 Chronic obstructive pulmonary disease, unspecified: Secondary | ICD-10-CM | POA: Diagnosis not present

## 2016-03-29 DIAGNOSIS — I6340 Cerebral infarction due to embolism of unspecified cerebral artery: Secondary | ICD-10-CM | POA: Diagnosis not present

## 2016-03-29 NOTE — Progress Notes (Signed)
Patient ID: Grant Lara, male   DOB: 1940/05/12, 75 y.o.   MRN: [id number]   Location:   [facility] Room Number: 107-B Place of Service:  SNF (31)   CODE STATUS: Full Code  Allergies  Allergen Reactions  . Tuberculin Tests     Per MAR (no description provided)    Chief Complaint  Patient presents with  . Hospitalization Follow-up    HPI:  He is long term resident being seen after being hospitalized for new onset seizure activity. He did have an EEG done; was started on keppra. He will need to follow up with neurology. He is unable to fully participate in the hpi or ros; but did tell me that he is feeling good. There are no nursing concerns at this time.    Past Medical History:  Diagnosis Date  . AICD (automatic cardioverter/defibrillator) present   . Arthritis    "used to have a touch in my legs"  . Bradycardia 06/01/2014  . CAD (coronary artery disease)    LHC 9/05 with Dr. Einar Gip:  dLM 20-30%, LAD 85%, oD1 20-30%.  PCI:  Taxus DES to LAD; Dx jailed and tx with POBA.  Last myoview 12/10: inf scar, no ischemia, EF 29%.  . CHF (congestive heart failure) ([place])   . Crohn's disease ([place])   . Dementia   . Diabetes mellitus    11/05/11 "borderline; don't take medications"  . Dilated cardiomyopathy ([place])    2/12: EF 30-35%, trivial AI, mild RAE.  EF 2014 40 -45%  . DVT (deep venous thrombosis) ([place])   . HCAP (healthcare-associated pneumonia) 09/29/2015  . HLD (hyperlipidemia)   . 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 Hyperlipidemia   . Hypertension   . Memory difficulties   . Nephrolithiasis   . PFO (patent foramen ovale)    Not mentioned on 2014 echo.  . Pneumonia ~ 2011   09-22-13 denies any recent SOB or breathing problems  . Pulmonary embolus ([facility])    chronic coumadin  . Stroke [facility]) 1993   "left arm can't hold steady; leg too"  . Swelling of both ankles     09-22-13 occ.feet, but denies pain.    Past Surgical History:  Procedure Laterality Date  .  APPENDECTOMY    . COLON SURGERY  1994; 1996   "for Crohn's disease"  . IMPLANTABLE CARDIOVERTER DEFIBRILLATOR IMPLANT N/A 06/02/2014   Procedure: IMPLANTABLE CARDIOVERTER DEFIBRILLATOR IMPLANT;  Surgeon: [name], MD;  Location[facility] CATH LAB;  Service: Cardiovascular;  Laterality: N/A;    Social History   Social History  . Marital status: Married    Spouse name: N/A  . Number of children: N/A  . Years of education: N/A   Occupational History  . Not on file.   Social History Main Topics  . Smoking status: Current Some Day Smoker    Packs/day: 0.50    Years: 53.00    Types: Cigarettes  . Smokeless tobacco: Never Used  . Alcohol use No  . Drug use: No  . Sexual activity: Not Currently   Other Topics Concern  . Not on file   Social History Narrative  . No narrative on file   Family History  Problem Relation Age of Onset  . Diabetes type II Mother   . CAD Father   . Diabetes type II Brother       VITAL SIGNS BP (!) 80/40   Pulse 87   Temp 97 F (36.1 C) (Oral)   Resp  14   Ht 5' 3"  (1.6 m)   Wt 125 lb (56.7 kg)   SpO2 97%   BMI 22.14 kg/m   Patient's Medications  New Prescriptions   No medications on file  Previous Medications   ACETAMINOPHEN (TYLENOL) 500 MG TABLET    Take 1,000 mg by mouth daily.    ALPRAZOLAM (XANAX) 0.5 MG TABLET    Take 1 tablet (0.5 mg total) by mouth at bedtime as needed.   ASPIRIN 81 MG TABLET    Take 81 mg by mouth daily.   ATORVASTATIN (LIPITOR) 10 MG TABLET    Take 1 tablet (10 mg total) by mouth daily at 6 PM.   DONEPEZIL (ARICEPT) 10 MG TABLET    Take 10 mg by mouth at bedtime.    FEEDING SUPPLEMENT, ENSURE ENLIVE, (ENSURE ENLIVE) LIQD    Take 237 mLs by mouth 2 (two) times daily between meals.   LATANOPROST (XALATAN) 0.005 % OPHTHALMIC SOLUTION    Place 1 drop into both eyes at bedtime.   LEVALBUTEROL (XOPENEX) 0.63 MG/3ML NEBULIZER SOLUTION    Take 3 mLs (0.63 mg total) by nebulization every 6 (six) hours as needed for  wheezing or shortness of breath.   LEVETIRACETAM (KEPPRA) 500 MG TABLET    Take 1 tablet (500 mg total) by mouth 2 (two) times daily.   RIVAROXABAN (XARELTO) 20 MG TABS TABLET    Take 1 tablet (20 mg total) by mouth daily with supper.   SENNA (SENOKOT) 8.6 MG TABS TABLET    Take 1 tablet by mouth every morning.   SERTRALINE (ZOLOFT) 50 MG TABLET    Take 50 mg by mouth every morning.    TAMSULOSIN (FLOMAX) 0.4 MG CAPS CAPSULE    Take 1 capsule (0.4 mg total) by mouth daily.  Modified Medications   No medications on file  Discontinued Medications   No medications on file     SIGNIFICANT DIAGNOSTIC EXAMS  09-08-15: ct of head and cervical spine: 1. No evidence of acute intracranial or cervical spine injury. 2. Extensive chronic ischemic injury as described, stable from 12/21/2014 comparison.  09-09-15: EEG: Clinical Interpretation: This essentially normal EEG is recorded in the waking and sleep state. There was no seizure or seizure predisposition recorded on this study. Please note that a normal EEG does not preclude the possibility of epilepsy.  09-29-15 ct of head: No acute abnormality. Extensive atrophy and multiple remote infarctions as seen on prior exams  09-30-15: swallow study: Regular solids;Thin liquid   10-05-15: chest x-ray: Retrocardiac density may represent atelectasis versus infiltrate.  10-06-15: right upper quad ultrasound: 1.  Small gallstone.  No biliary distention. 2. Liver is slightly echogenic suggesting fatty infiltration .  10-06-15: 2-d echo:   - Left ventricle: The cavity size was normal. Systolic function was moderately to severely reduced. The estimated ejection fraction was in the range of 30% to 35%. Diffuse hypokinesis. Doppler parameters are consistent with abnormal left ventricular relaxation (grade 1 diastolic dysfunction). There was no evidence of elevated ventricular filling pressure by Doppler parameters. - Aortic valve: There was mild regurgitation. -  Aortic root: The aortic root was normal in size. - Mitral valve: Structurally normal valve. There was mild regurgitation. - Left atrium: The atrium was mildly dilated. - Right ventricle: Pacer wire or catheter noted in right ventricle. Systolic function was normal. - Right atrium: The atrium was moderately dilated. Pacer wire or catheter noted in right atrium. - Tricuspid valve: There was mild regurgitation. - Pulmonary  arteries: Systolic pressure was mildly increased. PA peak pressure: 39 mm Hg (S). - Inferior vena cava: The vessel was dilated. The respirophasic diameter changes were blunted (< 50%), consistent with elevated central venous pressure. - Pericardium, extracardiac: There was no pericardial effusion. Impressions: - When compared to the prior study from 05/31/14 the LVEF has improved, now 30-35% with diffuse hypokinesis.  10-11-15: chest x-ray: right lower lobe atelectasis; no active TB.   11-16-15: left hip and pelvis: no acute osseous abnormalities.   03-26-16: ct of head: 1. Old cerebral and cerebellar infarcts without acute findings. 2. Periventricular white matter and corona radiata hypodensities favor chronic ischemic microvascular white matter disease.   03-27-16: EEG: This awake and drowsy EEG is abnormal due to the presence of: 1. Mild diffuse slowing of the waking background 2. Focal slowing over the left temporal region 3. Rare sharp waves over the left temporal region Clinical Correlation of the above findings indicates diffuse cerebral dysfunction that is non-specific in etiology and can be seen with hypoxic/ischemic injury, toxic/metabolic encephalopathies, neurodegenerative disorders, or medication effect. Focal slowing over the left temporal region indicates focal cerebral dysfunction in this region suggestive of underlying structural or physiologic abnormality. There is a possible tendency for seizures to arise from this region. There were no electrographic seizures in  this study. Clinical correlation is advised.    LABS REVIEWED:   03-29-15: wbc 4.9; hgb 9.7; hct 32.2; mcv 78.0; plt 247; glucose 97; bun 12; creat 1.11; k+ 4.0; na++141  09-08-15: wbc 7.2; hgb 7.3; hct 26.5; mcv 69.6; plt clump; glucose 218; bun 15; creat 1.31; k+ 4.0; na++ 137; liver normal albumin 3.0; mag 1.7; hgb a1c 6.8; tsh 1.135 09-10-15: wbc 9.8; hgb 9.0; hxct 31.2. mcv 71.6; plt 179; glucose 75; bun 14; creat 1.16; k+ 4.1; na++ 139 09-11-15: wbc 10.4; hg 8.7; hct 30.1; mcv 72.5; plt 208  09-15-15: wbc 9.2; hgb 9.8; hct 35.6; mcv 72.0; plt 251; glucose 133; bun 18.1; creat 1.04; k+ 4.6; na++ 140; liver normal albumin 3.0; chol 135; ldl 37; tri 82; hdl 81; hgb a1c 5.5 PSA 3.60  10-08-15: wbc 13.7; hgb 9.9; hct 33.3; mcv 74.0; plt 375; glucose 113; bun 23; creat 1.20; k+ 4.6; na++ 136  03-26-16: wbc 11.4 hgb 10.0; hct 34.5; mcv 71.7; plt 216; glucose 136; bun 16; creat 1.45; k+ 4.2; na++ 138; liver normal albumin 3.2 03-28-16: glucose 114; bun 20; creat 1.45; k+ 4.2; na++ 138     Review of Systems  Unable to perform ROS: dementia    Physical Exam  Constitutional: No distress.  Frail   Eyes: Conjunctivae are normal.  Neck: Neck supple. No JVD present. No thyromegaly present.  Cardiovascular: Normal rate, regular rhythm and intact distal pulses.   Respiratory: Effort normal and breath sounds normal. No respiratory distress. He has no wheezes.  GI: Soft. Bowel sounds are normal. He exhibits no distension. There is no tenderness.  Musculoskeletal: He exhibits no edema.  Left hemiparesis  Lymphadenopathy:    He has no cervical adenopathy.  Neurological: He is alert.  Skin: Skin is warm and dry. He is not diaphoretic.  Psychiatric: He has a normal mood and affect.    ASSESSMENT/ PLAN:  1.  Bilateral lower extremity pain: will continue tylenol 1 gm  daily for pain and will monitor his status   2. Dyslipidemia: will continue lipitor 10 mg daily ldl is 37   3. Dementia: without  significant change; his current weight is 125 pounds is presently  stable; will continue aricept 10 mg nightly   4. BPH: will continue flomax 0.4 mg daily   5. Chronic diastoic heart failure: EF 30-35% (10-06-15) is status post ICD placement (interrogated; now pace maker function only)   is currently not on medications; will not make changes will monitor  6. PE: is stable will continue xarelto 20 mg daily   7. CVA: is neurologically stable will continue xarelto 20 mg daily   8. Depression: will continue zoloft 50 mg daily and takes xanax 0.5 mg nightly for anxiety  As needed   9. CAD: no indications of chest pain present; will continue asa 81 mg daily  10. Glaucoma: will continue xalatan to both eyes.   11. Constipation: will continue senna daily   12. COPD: has xopenex neb every 6 hours as needed  will not make changes will monitor his status.   13. CKD stage III: bun/creat 23/1.20   14. Hypotension: is currently not on medications; will not make changes will monitor   15. Seizures: no further seizure activities; will continue keppra 500 mg twice daily and will monitor  16. Diabetes: currently not on medications; unable to tolerate ace due to low blood pressure    Will check cbc; cmp Will have him follow up with neurology in one month    Time spent with patient  50   minutes >50% time spent counseling; reviewing medical record; tests; labs; and developing future plan of care    MD is aware of resident's narcotic use and is in agreement with current plan of care. We will attempt to wean resident as apropriate   Ok Edwards NP [facility]  Contact [phone] Monday through Friday 8am- 5pm  After hours call [phone]

## 2016-04-01 LAB — CBC AND DIFFERENTIAL
HCT: 35 % — AB (ref 41–53)
HEMOGLOBIN: 9.9 g/dL — AB (ref 13.5–17.5)
NEUTROS ABS: 3 /uL
PLATELETS: 210 10*3/uL (ref 150–399)
WBC: 6.3 10^3/mL

## 2016-04-01 LAB — HEMOGLOBIN A1C: Hemoglobin A1C: 6.6

## 2016-04-01 LAB — BASIC METABOLIC PANEL
BUN: 18 mg/dL (ref 4–21)
Creatinine: 1.2 mg/dL (ref 0.6–1.3)
Glucose: 86 mg/dL
Potassium: 4.7 mmol/L (ref 3.4–5.3)
Sodium: 140 mmol/L (ref 137–147)

## 2016-04-01 LAB — HEPATIC FUNCTION PANEL
ALK PHOS: 103 U/L (ref 25–125)
ALT: 21 U/L (ref 10–40)
AST: 19 U/L (ref 14–40)
Bilirubin, Total: 0.7 mg/dL

## 2016-04-01 LAB — LIPID PANEL
CHOLESTEROL: 157 mg/dL (ref 0–200)
HDL: 66 mg/dL (ref 35–70)
LDL CALC: 67 mg/dL
TRIGLYCERIDES: 119 mg/dL (ref 40–160)

## 2016-04-04 LAB — BASIC METABOLIC PANEL
BUN: 20 mg/dL (ref 4–21)
CREATININE: 1.3 mg/dL (ref <=1.3)
Glucose: 199 mg/dL
Potassium: 4.2 mmol/L (ref 3.4–5.3)
Sodium: 140 mmol/L (ref 137–147)

## 2016-04-04 LAB — CBC AND DIFFERENTIAL
HCT: 37 % — AB (ref 41–53)
HEMOGLOBIN: 10.5 g/dL — AB (ref 13.5–17.5)
PLATELETS: 229 10*3/uL (ref 150–399)
WBC: 10.1 10^3/mL

## 2016-04-12 ENCOUNTER — Other Ambulatory Visit: Payer: Self-pay | Admitting: *Deleted

## 2016-04-16 ENCOUNTER — Non-Acute Institutional Stay (SKILLED_NURSING_FACILITY): Payer: Medicare Other | Admitting: Internal Medicine

## 2016-04-16 ENCOUNTER — Encounter: Payer: Self-pay | Admitting: Internal Medicine

## 2016-04-16 DIAGNOSIS — E8809 Other disorders of plasma-protein metabolism, not elsewhere classified: Secondary | ICD-10-CM

## 2016-04-16 DIAGNOSIS — F0150 Vascular dementia without behavioral disturbance: Secondary | ICD-10-CM | POA: Diagnosis not present

## 2016-04-16 DIAGNOSIS — N183 Chronic kidney disease, stage 3 (moderate): Secondary | ICD-10-CM

## 2016-04-16 DIAGNOSIS — I2782 Chronic pulmonary embolism: Secondary | ICD-10-CM

## 2016-04-16 DIAGNOSIS — F0393 Unspecified dementia, unspecified severity, with mood disturbance: Secondary | ICD-10-CM

## 2016-04-16 DIAGNOSIS — F0280 Dementia in other diseases classified elsewhere without behavioral disturbance: Secondary | ICD-10-CM

## 2016-04-16 DIAGNOSIS — R569 Unspecified convulsions: Secondary | ICD-10-CM

## 2016-04-16 DIAGNOSIS — I5042 Chronic combined systolic (congestive) and diastolic (congestive) heart failure: Secondary | ICD-10-CM

## 2016-04-16 DIAGNOSIS — F329 Major depressive disorder, single episode, unspecified: Secondary | ICD-10-CM

## 2016-04-16 DIAGNOSIS — Z95810 Presence of automatic (implantable) cardiac defibrillator: Secondary | ICD-10-CM

## 2016-04-16 DIAGNOSIS — Z8673 Personal history of transient ischemic attack (TIA), and cerebral infarction without residual deficits: Secondary | ICD-10-CM

## 2016-04-16 DIAGNOSIS — E1122 Type 2 diabetes mellitus with diabetic chronic kidney disease: Secondary | ICD-10-CM

## 2016-04-16 DIAGNOSIS — Z8679 Personal history of other diseases of the circulatory system: Secondary | ICD-10-CM

## 2016-04-16 NOTE — Progress Notes (Signed)
Patient ID: Grant Lara, male   DOB: 09-29-1940, 76 y.o.   MRN: [id number]    HISTORY AND PHYSICAL   DATE: 04/16/2016  Location:    [facility] Room Number: 107 B Place of Service: SNF (31)   Extended Emergency Contact Information Primary Emergency Contact: Barrick,[name] Address: [address]          [place], [place] [address] [place] Phone: [phone] Mobile Phone: [phone] Relation: Spouse Secondary Emergency Contact: [name],[name]          [place], [place] Mobile Phone: [phone] Relation: Daughter  Advanced Directive information Does Patient Have a Medical Advance Directive?: Yes, Would patient like information on creating a medical advance directive?: No - Patient declined, Type of Advance Directive: Out of facility DNR (pink MOST or yellow form), Pre-existing out of facility DNR order (yellow form or pink MOST form): Pink MOST form placed in chart (order not valid for inpatient use) (Attemp CPR)  Chief Complaint  Patient presents with  . Readmit To SNF    HPI:  76 yo male long term resident seen today as a readmission into SNF following hospital stay for confusion and possible sz, ICD in place, dementia, hx CVA, CAD, hyperlipidemia, BPH, hx PE/DVT. He had a "staring" spell prior to admission. EEG revealed electroclinical syndrome c/w temporal lobe sz. Neuro recommended anticonvulsant due to EEG and hx dementia and prior PCA CVA. Keppra rx. ICD interrogated. He presents to SNF for long term care.  Today he has no concerns. No sz since readmission. No nursing concerns. No falls. Appetite ok. Sleeps well. He is a poor historian due to dementia. Hx obtained from chart.  Bilateral lower extremity pain - stable on tylenol 1 gm  daily for pain    Dyslipidemia - stable on lipitor 10 mg daily. LDL 37   Dementia - stable on aricept 10 mg nightly. Albumin 3.2  BPH - stable on flomax 0.4 mg daily   Chronic diastoic heart  failure - EF 30-35% (10-06-15); s/p ICD placement (interrogated; now pace maker function only). He takes ASA daily  Hx PE/DVT -stable on xarelto 20 mg daily   Hx CVA - stable on ASA and xarelto 20 mg daily   Depression - mood stable on zoloft 50 mg daily and takes xanax 0.5 mg nightly for anxiety prn  CAD - asymptomatic. He takes ASA 81 mg daily  Glaucoma - stable on xalatan to both eyes.   Constipation - stable on senna daily   COPD -  Stable. No recent exacerbation. Has xopenex neb every 6 hours as needed   CKD - stage 3. Cr 1.43   HTN - diet controlled  DM - diet controlled. unable to tolerate ACEI due to low blood pressure. A1c 6.6%  Past Medical History:  Diagnosis Date  . AICD (automatic cardioverter/defibrillator) present   . Arthritis    "used to have a touch in my legs"  . Bradycardia 06/01/2014  . CAD (coronary artery disease)    LHC 9/05 with Dr. Einar Gip:  dLM 20-30%, LAD 85%, oD1 20-30%.  PCI:  Taxus DES to LAD; Dx jailed and tx with POBA.  Last myoview 12/10: inf scar, no ischemia, EF 29%.  . CHF (congestive heart failure) ([facility])   . Crohn's disease ([facility])   . Dementia   . Diabetes mellitus    11/05/11 "borderline; don't take medications"  . Dilated cardiomyopathy ([facility])    2/12: EF 30-35%, trivial AI, mild  RAE.  EF 2014 40 -45%  . DVT (deep venous thrombosis) ([facility])   . HCAP (healthcare-associated pneumonia) 09/29/2015  . HLD (hyperlipidemia)   . 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 Hyperlipidemia   . Hypertension   . Memory difficulties   . Nephrolithiasis   . PFO (patent foramen ovale)    Not mentioned on 2014 echo.  . Pneumonia ~ 2011   09-22-13 denies any recent SOB or breathing problems  . Pulmonary embolus ([facility])    chronic coumadin  . Stroke [facility]) 1993   "left arm can't hold steady; leg too"  . Swelling of both ankles     09-22-13 occ.feet, but denies pain.    Past Surgical History:  Procedure Laterality Date  . APPENDECTOMY    . COLON SURGERY  1994; 1996     "for Crohn's disease"  . IMPLANTABLE CARDIOVERTER DEFIBRILLATOR IMPLANT N/A 06/02/2014   Procedure: IMPLANTABLE CARDIOVERTER DEFIBRILLATOR IMPLANT;  Surgeon: [name], MD;  Location: [facility] CATH LAB;  Service: Cardiovascular;  Laterality: N/A;    Patient Care Team: Maury Dus, MD as PCP - General (Family Medicine)  Social History   Social History  . Marital status: Married    Spouse name: N/A  . Number of children: N/A  . Years of education: N/A   Occupational History  . Not on file.   Social History Main Topics  . Smoking status: Current Some Day Smoker    Packs/day: 0.50    Years: 53.00    Types: Cigarettes  . Smokeless tobacco: Never Used  . Alcohol use No  . Drug use: No  . Sexual activity: Not Currently   Other Topics Concern  . Not on file   Social History Narrative  . No narrative on file     reports that he has been smoking Cigarettes.  He has a 26.50 pack-year smoking history. He has never used smokeless tobacco. He reports that he does not drink alcohol or use drugs.  Family History  Problem Relation Age of Onset  . Diabetes type II Mother   . CAD Father   . Diabetes type II Brother    Family Status  Relation Status  . Mother Deceased  . Father Deceased  . Paternal Grandfather Deceased  . Paternal Grandmother Deceased  . Maternal Grandfather Deceased  . Maternal Grandmother Deceased  . Brother     Immunization History  Administered Date(s) Administered  . Influenza Whole 02/09/2010  . Influenza-Unspecified 01/21/2016  . PPD Test 11/05/2011  . Pneumococcal Polysaccharide-23 02/19/2010    Allergies  Allergen Reactions  . Tuberculin Tests     Per MAR (no description provided)    Medications: Patient's Medications  New Prescriptions   No medications on file  Previous Medications   ACETAMINOPHEN (TYLENOL) 500 MG TABLET    Take 1,000 mg by mouth daily.    ALPRAZOLAM (XANAX) 0.5 MG TABLET    Take 1 tablet (0.5 mg total) by mouth at  bedtime as needed.   ASPIRIN 81 MG TABLET    Take 81 mg by mouth daily.   ATORVASTATIN (LIPITOR) 10 MG TABLET    Take 1 tablet (10 mg total) by mouth daily at 6 PM.   DONEPEZIL (ARICEPT) 10 MG TABLET    Take 10 mg by mouth at bedtime.    FEEDING SUPPLEMENT, ENSURE ENLIVE, (ENSURE ENLIVE) LIQD    Take 237 mLs by mouth 2 (two) times daily between meals.   LATANOPROST (XALATAN) 0.005 % OPHTHALMIC SOLUTION  Place 1 drop into both eyes at bedtime.   LEVALBUTEROL (XOPENEX) 0.63 MG/3ML NEBULIZER SOLUTION    Take 3 mLs (0.63 mg total) by nebulization every 6 (six) hours as needed for wheezing or shortness of breath.   LEVETIRACETAM (KEPPRA) 500 MG TABLET    Take 1 tablet (500 mg total) by mouth 2 (two) times daily.   RIVAROXABAN (XARELTO) 20 MG TABS TABLET    Take 1 tablet (20 mg total) by mouth daily with supper.   SENNA (SENOKOT) 8.6 MG TABS TABLET    Take 1 tablet by mouth every morning.   SERTRALINE (ZOLOFT) 50 MG TABLET    Take 50 mg by mouth every morning.    TAMSULOSIN (FLOMAX) 0.4 MG CAPS CAPSULE    Take 1 capsule (0.4 mg total) by mouth daily.  Modified Medications   No medications on file  Discontinued Medications   No medications on file    Review of Systems  Unable to perform ROS: Dementia    Vitals:   04/16/16 1121  BP: 100/80  Pulse: 87  Resp: 16  Temp: 98.6 F (37 C)  TempSrc: Oral  SpO2: 98%  Weight: 125 lb 3.2 oz (56.8 kg)  Height: 5' 3"  (1.6 m)   Body mass index is 22.18 kg/m.  Physical Exam  Constitutional: He appears well-developed.  Frail appearing sitting on bed in NAD  HENT:  Mouth/Throat: Oropharynx is clear and moist.  Eyes: Pupils are equal, round, and reactive to light. No scleral icterus.  Neck: Neck supple. Carotid bruit is not present. No thyromegaly present.  Cardiovascular: Normal rate, regular rhythm and intact distal pulses.  Exam reveals no gallop and no friction rub.   Murmur (1/6 SEM) heard. no distal LE swelling. No calf TTP    Pulmonary/Chest: Effort normal and breath sounds normal. He has no wheezes. He has no rales. He exhibits no tenderness.  Abdominal: Soft. Bowel sounds are normal. He exhibits no distension, no abdominal bruit, no pulsatile midline mass and no mass. There is no hepatomegaly. There is no tenderness. There is no rebound and no guarding.  Musculoskeletal: He exhibits edema (b/l ankle).  Lymphadenopathy:    He has no cervical adenopathy.  Neurological: He is alert.  Skin: Skin is warm and dry. No rash noted.  Psychiatric: He has a normal mood and affect. His behavior is normal. Cognition and memory are impaired.     Labs reviewed: Lab on 04/12/2016  Component Date Value Ref Range Status  . Hemoglobin 04/04/2016 10.5* 13.5 - 17.5 g/dL Final  . HCT 04/04/2016 37* 41 - 53 % Final  . Platelets 04/04/2016 229  150 - 399 K/L Final  . WBC 04/04/2016 10.1  10^3/mL Final  . Glucose 04/04/2016 199  mg/dL Final  . BUN 04/04/2016 20  4 - 21 mg/dL Final  . Creatinine 04/04/2016 1.3  0.6 - 1.3 mg/dL Final  . Potassium 04/04/2016 4.2  3.4 - 5.3 mmol/L Final  . Sodium 04/04/2016 140  137 - 147 mmol/L Final  Admission on 03/26/2016, Discharged on 03/28/2016  Component Date Value Ref Range Status  . Sodium 03/26/2016 138  135 - 145 mmol/L Final  . Potassium 03/26/2016 4.2  3.5 - 5.1 mmol/L Final  . Chloride 03/26/2016 107  101 - 111 mmol/L Final  . CO2 03/26/2016 23  22 - 32 mmol/L Final  . Glucose, Bld 03/26/2016 136* 65 - 99 mg/dL Final  . BUN 03/26/2016 16  6 - 20 mg/dL Final  . Creatinine,  Ser 03/26/2016 1.45* 0.61 - 1.24 mg/dL Final  . Calcium 03/26/2016 9.3  8.9 - 10.3 mg/dL Final  . Total Protein 03/26/2016 7.1  6.5 - 8.1 g/dL Final  . Albumin 03/26/2016 3.2* 3.5 - 5.0 g/dL Final  . AST 03/26/2016 25  15 - 41 U/L Final  . ALT 03/26/2016 30  17 - 63 U/L Final  . Alkaline Phosphatase 03/26/2016 91  38 - 126 U/L Final  . Total Bilirubin 03/26/2016 0.7  0.3 - 1.2 mg/dL Final  . GFR calc non Af  Amer 03/26/2016 45* >60 mL/min Final  . GFR calc Af Amer 03/26/2016 52* >60 mL/min Final   Comment: (NOTE) The eGFR has been calculated using the CKD EPI equation. This calculation has not been validated in all clinical situations. eGFR's persistently <60 mL/min signify possible Chronic Kidney Disease.   . Anion gap 03/26/2016 8  5 - 15 Final  . WBC 03/26/2016 11.4* 4.0 - 10.5 K/uL Final  . RBC 03/26/2016 4.81  4.22 - 5.81 MIL/uL Final  . Hemoglobin 03/26/2016 10.0* 13.0 - 17.0 g/dL Final  . HCT 03/26/2016 34.5* 39.0 - 52.0 % Final  . MCV 03/26/2016 71.7* 78.0 - 100.0 fL Final  . MCH 03/26/2016 20.8* 26.0 - 34.0 pg Final  . MCHC 03/26/2016 29.0* 30.0 - 36.0 g/dL Final  . RDW 03/26/2016 17.5* 11.5 - 15.5 % Final  . Platelets 03/26/2016 216  150 - 400 K/uL Final  . Neutrophils Relative % 03/26/2016 80  % Final  . Lymphocytes Relative 03/26/2016 11  % Final  . Monocytes Relative 03/26/2016 8  % Final  . Eosinophils Relative 03/26/2016 1  % Final  . Basophils Relative 03/26/2016 0  % Final  . Neutro Abs 03/26/2016 9.1* 1.7 - 7.7 K/uL Final  . Lymphs Abs 03/26/2016 1.3  0.7 - 4.0 K/uL Final  . Monocytes Absolute 03/26/2016 0.9  0.1 - 1.0 K/uL Final  . Eosinophils Absolute 03/26/2016 0.1  0.0 - 0.7 K/uL Final  . Basophils Absolute 03/26/2016 0.0  0.0 - 0.1 K/uL Final  . RBC Morphology 03/26/2016 ELLIPTOCYTES   Final  . Troponin i, poc 03/26/2016 0.01  0.00 - 0.08 ng/mL Final  . Comment 3 03/26/2016          Final   Comment: Due to the release kinetics of cTnI, a negative result within the first hours of the onset of symptoms does not rule out myocardial infarction with certainty. If myocardial infarction is still suspected, repeat the test at appropriate intervals.   [name] MRSA by PCR 03/27/2016 NEGATIVE  NEGATIVE Final   Comment:        The GeneXpert MRSA Assay (FDA approved for NASAL specimens only), is one component of a comprehensive MRSA colonization surveillance program. It is  not intended to diagnose MRSA infection nor to guide or monitor treatment for MRSA infections.   . Sodium 03/28/2016 138  135 - 145 mmol/L Final  . Potassium 03/28/2016 4.2  3.5 - 5.1 mmol/L Final  . Chloride 03/28/2016 105  101 - 111 mmol/L Final  . CO2 03/28/2016 26  22 - 32 mmol/L Final  . Glucose, Bld 03/28/2016 114* 65 - 99 mg/dL Final  . BUN 03/28/2016 20  6 - 20 mg/dL Final  . Creatinine, Ser 03/28/2016 1.43* 0.61 - 1.24 mg/dL Final  . Calcium 03/28/2016 9.4  8.9 - 10.3 mg/dL Final  . GFR calc non Af Amer 03/28/2016 46* >60 mL/min Final  . GFR calc Af Amer 03/28/2016 53* >  60 mL/min Final   Comment: (NOTE) The eGFR has been calculated using the CKD EPI equation. This calculation has not been validated in all clinical situations. eGFR's persistently <60 mL/min signify possible Chronic Kidney Disease.   . Anion gap 03/28/2016 7  5 - 15 Final    Ct Head Wo Contrast  Result Date: 03/26/2016 CLINICAL DATA:  Altered mental status EXAM: CT HEAD WITHOUT CONTRAST TECHNIQUE: Contiguous axial images were obtained from the base of the skull through the vertex without intravenous contrast. COMPARISON:  09/29/2015 FINDINGS: Brain: Prior left peripheral cerebellar infarct with small lacunar infarct in the right posterior cerebellum, stable. Prior left PCA distribution infarct with prior suspected watershed infarcts, unchanged from prior. Stable lacunar infarcts in the right thalamus and left lentiform nuclei. Periventricular white matter and corona radiata hypodensities favor chronic ischemic microvascular white matter disease. Vascular: Unremarkable Skull: Unremarkable Sinuses/Orbits: Unremarkable Other: Calcified transverse ligament at C1. IMPRESSION: 1. Old cerebral and cerebellar infarcts without acute findings. 2. Periventricular white matter and corona radiata hypodensities favor chronic ischemic microvascular white matter disease. Electronically Signed   By: Van Clines M.D.   On:  03/26/2016 21:35     Assessment/Plan   ICD-9-CM ICD-10-CM   1. Seizures ([place]) 780.39 R56.9    temporal lobe  2. Vascular dementia without behavioral disturbance 290.40 F01.50   3. History of embolic stroke J95.36 V22.30   4. ICD (implantable cardioverter-defibrillator) in place V45.02 Z95.810   5. Other chronic pulmonary embolism without acute cor pulmonale (HCC) 416.2 I27.82   6. Chronic combined systolic and diastolic CHF (congestive heart failure) (HCC) 428.42 I50.42    428.0    7. Depression due to dementia 311 F32.9    294.10 F02.80   8. Type 2 diabetes mellitus with stage 3 chronic kidney disease, without long-term current use of insulin (HCC) 250.40 E11.22    585.3 N18.3   9. Hypoalbuminemia 273.8 E88.09    Check BMP and CBC  f/u with neurology in 1 month for sz   f/u with other specialists as scheduled  Cont current meds as ordered  Nutritional supplements as ordered  PT/OT/ST as indicated  GOAL: long term care. Communicated with pt and nursing.  Will follow  Nil Xiong S. Perlie Gold  [facility] [address] [place], [place] [address] [phone] Cell (Monday-Friday 8 AM - 5 PM) [phone] After 5 PM and follow prompts

## 2016-05-08 ENCOUNTER — Non-Acute Institutional Stay (SKILLED_NURSING_FACILITY): Payer: Medicare Other | Admitting: Adult Health

## 2016-05-08 DIAGNOSIS — R627 Adult failure to thrive: Secondary | ICD-10-CM | POA: Diagnosis not present

## 2016-05-08 DIAGNOSIS — I2510 Atherosclerotic heart disease of native coronary artery without angina pectoris: Secondary | ICD-10-CM | POA: Diagnosis not present

## 2016-05-08 DIAGNOSIS — N183 Chronic kidney disease, stage 3 unspecified: Secondary | ICD-10-CM

## 2016-05-08 DIAGNOSIS — E1122 Type 2 diabetes mellitus with diabetic chronic kidney disease: Secondary | ICD-10-CM

## 2016-05-08 DIAGNOSIS — I110 Hypertensive heart disease with heart failure: Secondary | ICD-10-CM | POA: Diagnosis not present

## 2016-05-08 DIAGNOSIS — I5042 Chronic combined systolic (congestive) and diastolic (congestive) heart failure: Secondary | ICD-10-CM

## 2016-05-08 DIAGNOSIS — F0150 Vascular dementia without behavioral disturbance: Secondary | ICD-10-CM

## 2016-05-08 DIAGNOSIS — J449 Chronic obstructive pulmonary disease, unspecified: Secondary | ICD-10-CM

## 2016-05-08 DIAGNOSIS — Z95810 Presence of automatic (implantable) cardiac defibrillator: Secondary | ICD-10-CM

## 2016-05-08 DIAGNOSIS — R569 Unspecified convulsions: Secondary | ICD-10-CM | POA: Diagnosis not present

## 2016-05-11 ENCOUNTER — Emergency Department (HOSPITAL_COMMUNITY)
Admission: EM | Admit: 2016-05-11 | Discharge: 2016-05-11 | Disposition: A | Discharge status: Home / Self Care | Payer: Medicare Other | Attending: Emergency Medicine | Admitting: Emergency Medicine

## 2016-05-11 ENCOUNTER — Emergency Department (HOSPITAL_COMMUNITY): Payer: Medicare Other

## 2016-05-11 ENCOUNTER — Encounter (HOSPITAL_COMMUNITY): Payer: Self-pay | Admitting: *Deleted

## 2016-05-11 DIAGNOSIS — Y999 Unspecified external cause status: Secondary | ICD-10-CM | POA: Insufficient documentation

## 2016-05-11 DIAGNOSIS — I130 Hypertensive heart and chronic kidney disease with heart failure and stage 1 through stage 4 chronic kidney disease, or unspecified chronic kidney disease: Secondary | ICD-10-CM | POA: Diagnosis not present

## 2016-05-11 DIAGNOSIS — I2510 Atherosclerotic heart disease of native coronary artery without angina pectoris: Secondary | ICD-10-CM | POA: Diagnosis not present

## 2016-05-11 DIAGNOSIS — Z8673 Personal history of transient ischemic attack (TIA), and cerebral infarction without residual deficits: Secondary | ICD-10-CM | POA: Diagnosis not present

## 2016-05-11 DIAGNOSIS — R51 Headache: Secondary | ICD-10-CM | POA: Insufficient documentation

## 2016-05-11 DIAGNOSIS — W19XXXA Unspecified fall, initial encounter: Secondary | ICD-10-CM

## 2016-05-11 DIAGNOSIS — Y939 Activity, unspecified: Secondary | ICD-10-CM | POA: Diagnosis not present

## 2016-05-11 DIAGNOSIS — E1122 Type 2 diabetes mellitus with diabetic chronic kidney disease: Secondary | ICD-10-CM | POA: Diagnosis not present

## 2016-05-11 DIAGNOSIS — N183 Chronic kidney disease, stage 3 (moderate): Secondary | ICD-10-CM | POA: Insufficient documentation

## 2016-05-11 DIAGNOSIS — Z95810 Presence of automatic (implantable) cardiac defibrillator: Secondary | ICD-10-CM | POA: Diagnosis not present

## 2016-05-11 DIAGNOSIS — Z7982 Long term (current) use of aspirin: Secondary | ICD-10-CM | POA: Diagnosis not present

## 2016-05-11 DIAGNOSIS — F17210 Nicotine dependence, cigarettes, uncomplicated: Secondary | ICD-10-CM | POA: Insufficient documentation

## 2016-05-11 DIAGNOSIS — W228XXA Striking against or struck by other objects, initial encounter: Secondary | ICD-10-CM | POA: Insufficient documentation

## 2016-05-11 DIAGNOSIS — Y929 Unspecified place or not applicable: Secondary | ICD-10-CM | POA: Diagnosis not present

## 2016-05-11 DIAGNOSIS — Z7901 Long term (current) use of anticoagulants: Secondary | ICD-10-CM | POA: Diagnosis not present

## 2016-05-11 DIAGNOSIS — I5042 Chronic combined systolic (congestive) and diastolic (congestive) heart failure: Secondary | ICD-10-CM | POA: Insufficient documentation

## 2016-05-11 LAB — URINALYSIS, ROUTINE W REFLEX MICROSCOPIC
BILIRUBIN URINE: NEGATIVE
GLUCOSE, UA: 50 mg/dL — AB
Ketones, ur: NEGATIVE mg/dL
LEUKOCYTES UA: NEGATIVE
NITRITE: NEGATIVE
PH: 5 (ref 5.0–8.0)
Protein, ur: 100 mg/dL — AB
Specific Gravity, Urine: 1.027 (ref 1.005–1.030)

## 2016-05-11 LAB — CBC
HCT: 33.6 % — ABNORMAL LOW (ref 39.0–52.0)
HEMOGLOBIN: 9.8 g/dL — AB (ref 13.0–17.0)
MCH: 21 pg — ABNORMAL LOW (ref 26.0–34.0)
MCHC: 29.2 g/dL — ABNORMAL LOW (ref 30.0–36.0)
MCV: 71.9 fL — ABNORMAL LOW (ref 78.0–100.0)
Platelets: 198 10*3/uL (ref 150–400)
RBC: 4.67 MIL/uL (ref 4.22–5.81)
RDW: 17.7 % — ABNORMAL HIGH (ref 11.5–15.5)
WBC: 9.4 10*3/uL (ref 4.0–10.5)

## 2016-05-11 LAB — BASIC METABOLIC PANEL
ANION GAP: 7 (ref 5–15)
BUN: 18 mg/dL (ref 6–20)
CO2: 24 mmol/L (ref 22–32)
Calcium: 9.4 mg/dL (ref 8.9–10.3)
Chloride: 104 mmol/L (ref 101–111)
Creatinine, Ser: 1.44 mg/dL — ABNORMAL HIGH (ref 0.61–1.24)
GFR calc Af Amer: 53 mL/min — ABNORMAL LOW (ref >60)
GFR, EST NON AFRICAN AMERICAN: 45 mL/min — AB (ref >60)
Glucose, Bld: 190 mg/dL — ABNORMAL HIGH (ref 65–99)
POTASSIUM: 4.5 mmol/L (ref 3.5–5.1)
SODIUM: 135 mmol/L (ref 135–145)

## 2016-05-11 LAB — CBG MONITORING, ED: Glucose-Capillary: 172 mg/dL — ABNORMAL HIGH (ref 65–99)

## 2016-05-11 NOTE — Discharge Instructions (Signed)
Primary care follow-up as needed.

## 2016-05-11 NOTE — ED Triage Notes (Addendum)
Pt here from [facility] via [facility] for fall.  [facility] states they were not given report by staff, but the pt's roommate's family was there and they stated pt was moving from walker to chair, missed the chair and hit his head on the floor.  No loc.  Pt now says he's dizzy after fall.  CBG 186.  [facility] stated pt was AO X 4, but he has dementia.  Alert self and situation.  Pt on blood thinners, per [facility].  Pt now c/o occipital pain and neck pain (arrived wearing C-collar)

## 2016-05-11 NOTE — ED Notes (Signed)
Pt CBG was 172, notified Brenda(RN)

## 2016-05-11 NOTE — ED Provider Notes (Signed)
[facility] DEPT Provider Note   CSN: [id number] Arrival date & time: 05/11/16  1513     History   Chief Complaint Chief Complaint  Patient presents with  . Fall    on blood thinners    HPI Grant Lara is a 76 y.o. male. Complaint is fall  HPI:  Patient presents for evaluation after a fall. Had a witnessed fall at his care facility. Currently the patient's roommate's family witnessed him miss stepping when transferring from walker to chair and fell and hit his head on the floor. No loss of consciousness but complained of dizziness. Patient is anticoagulated he complained of headache and neck pain. Moving all extremities in route and upon arrival.  Past Medical History:  Diagnosis Date  . AICD (automatic cardioverter/defibrillator) present   . Arthritis    "used to have a touch in my legs"  . Bradycardia 06/01/2014  . CAD (coronary artery disease)    LHC 9/05 with Dr. Einar Gip:  dLM 20-30%, LAD 85%, oD1 20-30%.  PCI:  Taxus DES to LAD; Dx jailed and tx with POBA.  Last myoview 12/10: inf scar, no ischemia, EF 29%.  . CHF (congestive heart failure) ([place])   . Crohn's disease ([place])   . Dementia   . Diabetes mellitus    11/05/11 "borderline; don't take medications"  . Dilated cardiomyopathy ([place])    2/12: EF 30-35%, trivial AI, mild RAE.  EF 2014 40 -45%  . DVT (deep venous thrombosis) ([place])   . HCAP (healthcare-associated pneumonia) 09/29/2015  . HLD (hyperlipidemia)   . 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 Hyperlipidemia   . Hypertension   . Memory difficulties   . Nephrolithiasis   . PFO (patent foramen ovale)    Not mentioned on 2014 echo.  . Pneumonia ~ 2011   09-22-13 denies any recent SOB or breathing problems  . Pulmonary embolus (HCC)    chronic coumadin  . Stroke [facility]) 1993   "left arm can't hold steady; leg too"  . Swelling of both ankles     09-22-13 occ.feet, but denies pain.    Patient Active Problem List   Diagnosis Date Noted  . Seizures ([place]) 03/29/2016  .  Confusion 03/26/2016  . Protein-calorie malnutrition, moderate ([place]) 09/30/2015  . 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 ICD (implantable cardioverter-defibrillator) in place 09/29/2015  . Vascular dementia without behavioral disturbance 09/25/2015  . Anemia 09/08/2015  . OA (osteoarthritis) 06/06/2015  . Bilateral lower extremity pain 05/14/2015  . BPH (benign prostatic hyperplasia) 04/10/2015  . FTT (failure to thrive) in adult 11/05/2014  . Protein-calorie malnutrition, severe ([place]) 10/30/2014  . 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 Chronic combined systolic and diastolic CHF (congestive heart failure) ([place])   . Pulmonary embolus ([place]) 07/16/2014  . Late effects of CVA (cerebrovascular accident) 07/16/2014  . Cerebral infarction ([place])   . Hyperlipidemia   . Coronary artery disease involving native coronary artery of native heart without angina pectoris   . CKD (chronic kidney disease) stage 3, GFR 30-59 ml/min 04/16/2014  . Anemia, chronic disease 11/21/2013  . DM2 (diabetes mellitus, type 2) ([place]) 11/20/2013  . Crohn's disease ([place]) 02/16/2012  . DVT (deep venous thrombosis) ([place]) 06/05/2010  . COPD (chronic obstructive pulmonary disease) ([place]) 04/05/2010  . Hypertensive heart disease with CHF (congestive heart failure) ([place]) 02/10/2009  . TOBACCO ABUSE 12/12/2008  . Dilated cardiomyopathy ([place]) 12/12/2008    Past Surgical History:  Procedure Laterality Date  . APPENDECTOMY    .  COLON SURGERY  1994; 1996   "for Crohn's disease"  . IMPLANTABLE CARDIOVERTER DEFIBRILLATOR IMPLANT N/A 06/02/2014   Procedure: IMPLANTABLE CARDIOVERTER DEFIBRILLATOR IMPLANT;  Surgeon: [name], MD;  Location: [facility] CATH LAB;  Service: Cardiovascular;  Laterality: N/A;       Home Medications    Prior to Admission medications   Medication Sig Start Date End Date Taking? Authorizing Provider  acetaminophen (TYLENOL) 500 MG tablet Take 1,000 mg by mouth daily.    Yes Historical  Provider, MD  ALPRAZolam Duanne Moron) 0.5 MG tablet Take 1 tablet (0.5 mg total) by mouth at bedtime as needed. Patient taking differently: Take 0.5 mg by mouth at bedtime as needed for anxiety or sleep.  10/09/15  Yes Kelvin Cellar, MD  aspirin 81 MG tablet Take 81 mg by mouth daily.   Yes Historical Provider, MD  atorvastatin (LIPITOR) 10 MG tablet Take 1 tablet (10 mg total) by mouth daily at 6 PM. Patient taking differently: Take 10 mg by mouth every evening.  04/18/14  Yes Maryann Mikhail, DO  donepezil (ARICEPT) 10 MG tablet Take 10 mg by mouth at bedtime.  02/23/15  Yes Historical Provider, MD  feeding supplement, ENSURE ENLIVE, (ENSURE ENLIVE) LIQD Take 237 mLs by mouth 2 (two) times daily between meals. Patient taking differently: Take 237 mLs by mouth 2 (two) times daily.  10/04/15  Yes Hosie Poisson, MD  latanoprost (XALATAN) 0.005 % ophthalmic solution Place 1 drop into both eyes at bedtime. 06/29/13  Yes Historical Provider, MD  levalbuterol Penne Lash) 0.63 MG/3ML nebulizer solution Take 3 mLs (0.63 mg total) by nebulization every 6 (six) hours as needed for wheezing or shortness of breath. 09/11/15  Yes Ripudeep Krystal Eaton, MD  levETIRAcetam (KEPPRA) 500 MG tablet Take 1 tablet (500 mg total) by mouth 2 (two) times daily. 03/28/16  Yes Hosie Poisson, MD  rivaroxaban (XARELTO) 20 MG TABS tablet Take 1 tablet (20 mg total) by mouth daily with supper. Patient taking differently: Take 20 mg by mouth at bedtime.  06/23/14  Yes Hosie Poisson, MD  senna (SENOKOT) 8.6 MG TABS tablet Take 1 tablet by mouth every morning.   Yes Historical Provider, MD  sertraline (ZOLOFT) 50 MG tablet Take 50 mg by mouth every morning.  05/18/14  Yes Historical Provider, MD  tamsulosin (FLOMAX) 0.4 MG CAPS capsule Take 1 capsule (0.4 mg total) by mouth daily. 04/18/14  Yes Maryann Mikhail, DO    Family History Family History  Problem Relation Age of Onset  . Diabetes type II Mother   . CAD Father   . Diabetes type II Brother       Social History Social History  Substance Use Topics  . Smoking status: Current Some Day Smoker    Packs/day: 0.50    Years: 53.00    Types: Cigarettes  . Smokeless tobacco: Never Used  . Alcohol use No     Allergies   Tuberculin tests   Review of Systems Review of Systems  Unable to perform ROS: Dementia     Physical Exam Updated Vital Signs BP 104/80 (BP Location: Right Arm)   Pulse 67   Temp 98.4 F (36.9 C) (Oral)   Resp 16   Wt 130 lb (59 kg)   SpO2 100%   BMI 23.03 kg/m   Physical Exam  Constitutional: He appears well-developed and well-nourished. No distress.  HENT:  Head: Normocephalic.  No sinus tract to the head. No reported tenderness on the midline cervical spine. No blood collection  over the TMs, mastoids, or from ears nose or mouth.  Eyes: Conjunctivae are normal. Pupils are equal, round, and reactive to light. No scleral icterus.  Neck: Normal range of motion. Neck supple. No thyromegaly present.  Cardiovascular: Normal rate and regular rhythm.  Exam reveals no gallop and no friction rub.   No murmur heard. Pulmonary/Chest: Effort normal and breath sounds normal. No respiratory distress. He has no wheezes. He has no rales.  Abdominal: Soft. Bowel sounds are normal. He exhibits no distension. There is no tenderness. There is no rebound.  Musculoskeletal: Normal range of motion.  Neurological: He is alert.  Skin: Skin is warm and dry. No rash noted.  Psychiatric: He has a normal mood and affect. His behavior is normal.     ED Treatments / Results  Labs (all labs ordered are listed, but only abnormal results are displayed) Labs Reviewed  BASIC METABOLIC PANEL - Abnormal; Notable for the following:       Result Value   Glucose, Bld 190 (*)    Creatinine, Ser 1.44 (*)    GFR calc non Af Amer 45 (*)    GFR calc Af Amer 53 (*)    All other components within normal limits  CBC - Abnormal; Notable for the following:    Hemoglobin 9.8 (*)     HCT 33.6 (*)    MCV 71.9 (*)    MCH 21.0 (*)    MCHC 29.2 (*)    RDW 17.7 (*)    All other components within normal limits  URINALYSIS, ROUTINE W REFLEX MICROSCOPIC - Abnormal; Notable for the following:    Color, Urine AMBER (*)    APPearance CLOUDY (*)    Glucose, UA 50 (*)    Hgb urine dipstick LARGE (*)    Protein, ur 100 (*)    Bacteria, UA MANY (*)    Squamous Epithelial / LPF 0-5 (*)    All other components within normal limits  CBG MONITORING, ED - Abnormal; Notable for the following:    Glucose-Capillary 172 (*)    All other components within normal limits    EKG  EKG Interpretation  Date/Time:  Saturday May 11 2016 15:15:32 EST Ventricular Rate:  64 PR Interval:  160 QRS Duration: 94 QT Interval:  408 QTC Calculation: 420 R Axis:   68 Text Interpretation:  Normal sinus rhythm Nonspecific ST and T wave abnormality Abnormal ECG Confirmed by Jeneen Rinks  MD, [place] ([id number]) on 05/11/2016 3:26:21 PM Also confirmed by Jeneen Rinks  MD, [place] ([id number]), editor [place] [address], [place] [phone])  on 05/11/2016 4:06:48 PM       Radiology Ct Head Wo Contrast  Result Date: 05/11/2016 CLINICAL DATA:  Fall with trauma to the head.  Patient on Xarelto. EXAM: CT HEAD WITHOUT CONTRAST TECHNIQUE: Contiguous axial images were obtained from the base of the skull through the vertex without intravenous contrast. COMPARISON:  03/26/2016 FINDINGS: Brain: No evidence of acute infarction, hemorrhage, hydrocephalus, extra-axial collection or mass lesion/mass effect. Old cerebellar and cerebral infarcts are stable. Moderate brain parenchymal volume loss. Mild periventricular microangiopathy. Vascular: Calcific atherosclerotic disease at the skullbase. Skull: Normal. Negative for fracture or focal lesion. Sinuses/Orbits: No acute finding. Other: None. IMPRESSION: No acute intracranial abnormality. Chronic brain parenchymal atrophy, periventricular microvascular disease, and encephalomalacia from old cerebral and  cerebellar infarct. Electronically Signed   By: Fidela Salisbury M.D.   On: 05/11/2016 16:37   Ct Cervical Spine Wo Contrast  Result Date: 05/11/2016 CLINICAL DATA:  Status post  fall. EXAM: CT CERVICAL SPINE WITHOUT CONTRAST TECHNIQUE: Multidetector CT imaging of the cervical spine was performed without intravenous contrast. Multiplanar CT image reconstructions were also generated. COMPARISON:  09/08/2015 FINDINGS: Alignment: Normal. Skull base and vertebrae: No acute fracture. No primary bone lesion or focal pathologic process. 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 Fragmentation of the posterior aspect of the posterior processes of C7 and T1, either congenital or remote posttraumatic changes. Soft tissues and spinal canal: No prevertebral fluid or swelling. No visible canal hematoma. Disc levels: Multilevel osteoarthritic changes, most prominent at C4-C5-C5-C6 and C6-C7, with disc space narrowing, vertebral body remodeling and large osteophyte formation. Upper chest: Negative. Other: None. IMPRESSION: No evidence of acute traumatic injury to the cervical spine. Multilevel osteoarthritic changes. Electronically Signed   By: Fidela Salisbury M.D.   On: 05/11/2016 18:43    Procedures Procedures (including critical care time)  Medications Ordered in ED Medications - No data to display   Initial Impression / Assessment and Plan / ED Course  I have reviewed the triage vital signs and the nursing notes.  Pertinent labs & imaging results that were available during my care of the patient were reviewed by me and considered in my medical decision making (see chart for details).     CT head and neck show no acute findings. Patient appropriate for discharge back to his facility.  Final Clinical Impressions(s) / ED Diagnoses   Final diagnoses:  Fall, initial encounter    New Prescriptions New Prescriptions   No medications on file     Tanna Furry, MD 05/11/16 2026

## 2016-05-11 NOTE — ED Notes (Signed)
[facility] contacted to transport patient to [facility]

## 2016-05-11 NOTE — ED Notes (Signed)
Report given to [facility] at [place].  [facility] notified for transport.

## 2016-05-11 NOTE — ED Notes (Signed)
Leaving with [facility] at this time.

## 2016-05-11 NOTE — ED Notes (Signed)
Pt did not need anything at this time  

## 2016-05-15 ENCOUNTER — Non-Acute Institutional Stay (SKILLED_NURSING_FACILITY): Payer: Medicare Other | Admitting: Internal Medicine

## 2016-05-15 DIAGNOSIS — R269 Unspecified abnormalities of gait and mobility: Secondary | ICD-10-CM | POA: Diagnosis not present

## 2016-05-15 DIAGNOSIS — R1011 Right upper quadrant pain: Secondary | ICD-10-CM

## 2016-05-15 NOTE — Progress Notes (Signed)
This is an acute visit.  Level care skilled.  Facility is [facility] complaint-acute visit secondary to nursing notes stating patient has had some right upper quadrant pain.  History of present illness.  Patient is a pleasant 76 year old male with a long-term resident here with a history of numerous issues including dementia chronic diastolic CHF history of pulmonary embolism on Xarelto as well as history of CVA.  Nursing has left a note about some possible right upper quadrant pain.  I do note that he did have an abdominal ultrasound done in June 2017 secondary to elevated liver function tests showed a small gallstone.  Liver function tests have since normalized.  Talking with patient today he says he is not having any stomach pain he says this was a couple days ago and apparently of short duration--does not complain of any nausea and vomiting  I do note he did fall on the facility back on January 27 and did go to the ER where workup was fairly unremarkable CT of the head and cervical spine did not show any acute issues.  He is on Xarelto with a history of CVA as well as pulmonary embolism  He appears at his baseline today resting in bed comfortably he is alert somewhat confused but very pleasant and conversant   Past Medical History:  Diagnosis Date  . AICD (automatic cardioverter/defibrillator) present   . Arthritis    "used to have a touch in my legs"  . Bradycardia 06/01/2014  . CAD (coronary artery disease)    LHC 9/05 with Dr. Einar Gip:  dLM 20-30%, LAD 85%, oD1 20-30%.  PCI:  Taxus DES to LAD; Dx jailed and tx with POBA.  Last myoview 12/10: inf scar, no ischemia, EF 29%.  . CHF (congestive heart failure) ([place])   . Crohn's disease ([place])   . Dementia   . Diabetes mellitus    11/05/11 "borderline; don't take medications"  . Dilated cardiomyopathy ([place])    2/12: EF 30-35%, trivial AI, mild RAE.  EF 2014 40 -45%  . DVT (deep venous  thrombosis) ([place])   . HCAP (healthcare-associated pneumonia) 09/29/2015  . HLD (hyperlipidemia)   . 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 Hyperlipidemia   . Hypertension   . Memory difficulties   . Nephrolithiasis   . PFO (patent foramen ovale)    Not mentioned on 2014 echo.  . Pneumonia ~ 2011   09-22-13 denies any recent SOB or breathing problems  . Pulmonary embolus (HCC)    chronic coumadin  . Stroke [facility]) 1993   "left arm can't hold steady; leg too"  . Swelling of both ankles     09-22-13 occ.feet, but denies pain.         Past Surgical History:  Procedure Laterality Date  . APPENDECTOMY    . COLON SURGERY  1994; 1996   "for Crohn's disease"  . IMPLANTABLE CARDIOVERTER DEFIBRILLATOR IMPLANT N/A 06/02/2014   Procedure: IMPLANTABLE CARDIOVERTER DEFIBRILLATOR IMPLANT;  Surgeon: [name], MD;  Location: [facility] CATH LAB;  Service: Cardiovascular;  Laterality: N/A;    Social History        Social History  . Marital status: Married    Spouse name: N/A  . Number of children: N/A  . Years of education: N/A      Occupational History  . Not on file.        Social History Main Topics  . Smoking status: Current Some Day Smoker    Packs/day: 0.50  Years: 53.00    Types: Cigarettes  . Smokeless tobacco: Never Used  . Alcohol use No  . Drug use: No  . Sexual activity: Not Currently       Other Topics Concern  . Not on file      Social History Narrative  . No narrative on file        Family History  Problem Relation Age of Onset  . Diabetes type II Mother   . CAD Father   . Diabetes type II Brother       VITAL SIGNS  Temperature is 97.7 pulse 70 respirations 17 blood pressure 116/80      Patient's Medications  New Prescriptions   No medications on file  Previous Medications   ACETAMINOPHEN (TYLENOL) 500 MG TABLET    Take 1,000 mg by mouth daily.    ALPRAZOLAM (XANAX) 0.5 MG TABLET    Take 1 tablet (0.5 mg  total) by mouth at bedtime as needed.   ASPIRIN 81 MG TABLET    Take 81 mg by mouth daily.   ATORVASTATIN (LIPITOR) 10 MG TABLET    Take 1 tablet (10 mg total) by mouth daily at 6 PM.   DONEPEZIL (ARICEPT) 10 MG TABLET    Take 10 mg by mouth at bedtime.    FEEDING SUPPLEMENT, ENSURE ENLIVE, (ENSURE ENLIVE) LIQD    Take 237 mLs by mouth 2 (two) times daily between meals.   LATANOPROST (XALATAN) 0.005 % OPHTHALMIC SOLUTION    Place 1 drop into both eyes at bedtime.   LEVALBUTEROL (XOPENEX) 0.63 MG/3ML NEBULIZER SOLUTION    Take 3 mLs (0.63 mg total) by nebulization every 6 (six) hours as needed for wheezing or shortness of breath.   LEVETIRACETAM (KEPPRA) 500 MG TABLET    Take 1 tablet (500 mg total) by mouth 2 (two) times daily.   RIVAROXABAN (XARELTO) 20 MG TABS TABLET    Take 1 tablet (20 mg total) by mouth daily with supper.   SENNA (SENOKOT) 8.6 MG TABS TABLET    Take 1 tablet by mouth every morning.   SERTRALINE (ZOLOFT) 50 MG TABLET    Take 50 mg by mouth every morning.    TAMSULOSIN (FLOMAX) 0.4 MG CAPS CAPSULE    Take 1 capsule (0.4 mg total) by mouth daily.  Modified Medications   No medications on file  Discontinued Medications   No medications on file     SIGNIFICANT DIAGNOSTIC EXAMS  09-08-15: ct of head and cervical spine: 1. No evidence of acute intracranial or cervical spine injury. 2. Extensive chronic ischemic injury as described, stable from 12/21/2014 comparison.  09-09-15: EEG: Clinical Interpretation: This essentially normal EEG is recorded in the waking and sleep state. There was no seizure or seizure predisposition recorded on this study. Please note that a normal EEG does not preclude the possibility of epilepsy.  09-29-15 ct of head: No acute abnormality. Extensive atrophy and multiple remote infarctions as seen on prior exams  09-30-15: swallow study: Regular solids;Thin liquid  10-05-15: chest x-ray: Retrocardiac density may represent  atelectasis versus infiltrate.  10-06-15: right upper quad ultrasound: 1. Small gallstone. No biliary distention. 2. Liver is slightly echogenic suggesting fatty infiltration .  10-06-15: 2-d echo:  - Left ventricle: The cavity size was normal. Systolic function was moderately to severely reduced. The estimated ejection fraction was in the range of 30% to 35%. Diffuse hypokinesis. Doppler parameters are consistent with abnormal left ventricular relaxation (grade 1 diastolic dysfunction). There was no  evidence of elevated ventricular filling pressure by Doppler parameters. - Aortic valve: There was mild regurgitation. - Aortic root: The aortic root was normal in size. - Mitral valve: Structurally normal valve. There was mild regurgitation. - Left atrium: The atrium was mildly dilated. - Right ventricle: Pacer wire or catheter noted in right ventricle. Systolic function was normal. - Right atrium: The atrium was moderately dilated. Pacer wire or catheter noted in right atrium. - Tricuspid valve: There was mild regurgitation. - Pulmonary arteries: Systolic pressure was mildly increased. PA peak pressure: 39 mm Hg (S). - Inferior vena cava: The vessel was dilated. The respirophasic diameter changes were blunted (< 50%), consistent with elevated central venous pressure. - Pericardium, extracardiac: There was no pericardial effusion. Impressions: - When compared to the prior study from 05/31/14 the LVEF has improved, now 30-35% with diffuse hypokinesis.  10-11-15: chest x-ray: right lower lobe atelectasis; no active TB.   11-16-15: left hip and pelvis: no acute osseous abnormalities.   03-26-16: ct of head: 1. Old cerebral and cerebellar infarcts without acute findings. 2. Periventricular white matter and corona radiata hypodensities favor chronic ischemic microvascular white matter disease.  03-27-16: EEG: This awake and drowsyEEG is abnormal due to the presence of: 1. Milddiffuse slowing  of the waking background 2. Focal slowing over the left temporal region 3. Rare sharp waves over the left temporal region Clinical Correlation of the above findings indicates diffuse cerebral dysfunction that is non-specific in etiology and can be seen with hypoxic/ischemic injury, toxic/metabolic encephalopathies, neurodegenerative disorders, or medication effect. Focal slowing over the left temporal region indicates focal cerebral dysfunction in this region suggestive of underlying structural or physiologic abnormality. There is a possible tendency for seizures to arise from this region. There were no electrographic seizures in this study. Clinical correlation is advised.    LABS REVIEWED:   03-29-15: wbc 4.9; hgb 9.7; hct 32.2; mcv 78.0; plt 247; glucose 97; bun 12; creat 1.11; k+ 4.0; na++141  09-08-15: wbc 7.2; hgb 7.3; hct 26.5; mcv 69.6; plt clump; glucose 218; bun 15; creat 1.31; k+ 4.0; na++ 137; liver normal albumin 3.0; mag 1.7; hgb a1c 6.8; tsh 1.135 09-10-15: wbc 9.8; hgb 9.0; hxct 31.2. mcv 71.6; plt 179; glucose 75; bun 14; creat 1.16; k+ 4.1; na++ 139 09-11-15: wbc 10.4; hg 8.7; hct 30.1; mcv 72.5; plt 208  09-15-15: wbc 9.2; hgb 9.8; hct 35.6; mcv 72.0; plt 251; glucose 133; bun 18.1; creat 1.04; k+ 4.6; na++ 140; liver normal albumin 3.0; chol 135; ldl 37; tri 82; hdl 81; hgb a1c 5.5 PSA 3.60  10-08-15: wbc 13.7; hgb 9.9; hct 33.3; mcv 74.0; plt 375; glucose 113; bun 23; creat 1.20; k+ 4.6; na++ 136  03-26-16: wbc 11.4 hgb 10.0; hct 34.5; mcv 71.7; plt 216; glucose 136; bun 16; creat 1.45; k+ 4.2; na++ 138; liver normal albumin 3.2 03-28-16: glucose 114; bun 20; creat 1.45; k+ 4.2; na++ 138  05/11/2016.  WBC 9.4 hemoglobin 9.8 platelets 198.  Sodium 135 potassium 4.5 BUN 18 creatinine 1.44.  Urinalysis was negative for nitrites or leukocytes   Review of Systems  Limited secondary to dementia please see history of present illness again he is denying any abdominal  discomfort nausea or vomiting says he ate well today He is not complaining of any dysuria  Physical Exam  Constitutional: No distress. Lying comfortably in bed  Frail   Eyes: Conjunctivae are normal.  His skin is warm and dry  Cardiovascular: Normal rate, regular rhythm and intact  distal pulses.   Respiratory: Effort normal and breath sounds normal. No respiratory distress. He has no wheezes.  GI: Soft. Bowel sounds are normal. He exhibits no distension. There is no tenderness. Bowel sounds are active there is no tenderness in any quadrant  Musculoskeletal: He exhibits no edema.  Left hemiparesis  Lymphadenopathy:    He has no cervical adenopathy.  Neurological: He is alert.  Skin: Skin is warm and dry. He is not diaphoretic.  Psychiatric: He has a normal mood and affect. Pleasant and follows simple verbal commands without difficulty  Assessment and plan.  #1-right upper quadrant pain he says this was a couple days ago and has not reoccurred appears to be doing well today Will monitor for any changes again ultrasound back in June 2017 showed a small gallstone-since then his liver function tests have normalized.  Urinalysis in ER was negative for nitrites and leukocytes did show 6-30 white blood cells some blood but he is not complaining of any dysuria today --no hematuria has been noted at this point will monitor  #2 history of fall with no apparent injury again he did have the ER evaluation CT scan of the head and neck were nonacute.  He is not complaining of any headache dizziness today is resting in bed comfortably at this point will monitor   [phone]

## 2016-05-28 ENCOUNTER — Encounter: Payer: Self-pay | Admitting: Adult Health

## 2016-05-28 NOTE — Progress Notes (Signed)
Location:   [facility]   Place of Service:  SNF (31)   CODE STATUS: dnr  Allergies  Allergen Reactions  . Tuberculin Tests Other (See Comments)    Per MAR (no description provided)    Chief Complaint  Patient presents with  . Medical Management of Chronic Issues    HPI:  He is a long term resident of this facility being seen for the management of his chronic illnesses. He is unable to participate in the hpi or ros. There are no nursing concerns at this time.    Past Medical History:  Diagnosis Date  . AICD (automatic cardioverter/defibrillator) present   . Arthritis    "used to have a touch in my legs"  . Bradycardia 06/01/2014  . CAD (coronary artery disease)    LHC 9/05 with Dr. Einar Gip:  dLM 20-30%, LAD 85%, oD1 20-30%.  PCI:  Taxus DES to LAD; Dx jailed and tx with POBA.  Last myoview 12/10: inf scar, no ischemia, EF 29%.  . CHF (congestive heart failure) ([place])   . Crohn's disease ([place])   . Dementia   . Diabetes mellitus    11/05/11 "borderline; don't take medications"  . Dilated cardiomyopathy ([place])    2/12: EF 30-35%, trivial AI, mild RAE.  EF 2014 40 -45%  . DVT (deep venous thrombosis) ([place])   . HCAP (healthcare-associated pneumonia) 09/29/2015  . HLD (hyperlipidemia)   . 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 Hyperlipidemia   . Hypertension   . Memory difficulties   . Nephrolithiasis   . PFO (patent foramen ovale)    Not mentioned on 2014 echo.  . Pneumonia ~ 2011   09-22-13 denies any recent SOB or breathing problems  . Pulmonary embolus ([facility])    chronic coumadin  . Stroke [facility]) 1993   "left arm can't hold steady; leg too"  . Swelling of both ankles     09-22-13 occ.feet, but denies pain.    Past Surgical History:  Procedure Laterality Date  . APPENDECTOMY    . COLON SURGERY  1994; 1996   "for Crohn's disease"  . IMPLANTABLE CARDIOVERTER DEFIBRILLATOR IMPLANT N/A 06/02/2014   Procedure: IMPLANTABLE CARDIOVERTER DEFIBRILLATOR IMPLANT;  Surgeon: [name], MD;  Location: [facility] CATH LAB;  Service: Cardiovascular;  Laterality: N/A;    Social History   Social History  . Marital status: Married    Spouse name: N/A  . Number of children: N/A  . Years of education: N/A   Occupational History  . Not on file.   Social History Main Topics  . Smoking status: Current Some Day Smoker    Packs/day: 0.50    Years: 53.00    Types: Cigarettes  . Smokeless tobacco: Never Used  . Alcohol use No  . Drug use: No  . Sexual activity: Not Currently   Other Topics Concern  . Not on file   Social History Narrative  . No narrative on file   Family History  Problem Relation Age of Onset  . Diabetes type II Mother   . CAD Father   . Diabetes type II Brother       VITAL SIGNS BP 120/70   Pulse 70   Temp 98.6 F (37 C)   Resp 18   Ht 5' 3"  (1.6 m)   Wt 131 lb 9.6 oz (59.7 kg)   SpO2 98%   BMI 23.31 kg/m   Patient's Medications  New Prescriptions   No medications on file  Previous Medications  ACETAMINOPHEN (TYLENOL) 500 MG TABLET    Take 1,000 mg by mouth daily.    ALPRAZOLAM (XANAX) 0.5 MG TABLET    Take 0.5 mg by mouth daily as needed for anxiety.   ASPIRIN 81 MG TABLET    Take 81 mg by mouth daily.   ATORVASTATIN (LIPITOR) 10 MG TABLET    Take 1 tablet (10 mg total) by mouth daily at 6 PM.   DONEPEZIL (ARICEPT) 10 MG TABLET    Take 10 mg by mouth at bedtime.    FEEDING SUPPLEMENT, ENSURE ENLIVE, (ENSURE ENLIVE) LIQD    Take 237 mLs by mouth 2 (two) times daily between meals.   LATANOPROST (XALATAN) 0.005 % OPHTHALMIC SOLUTION    Place 1 drop into both eyes at bedtime.   LEVALBUTEROL (XOPENEX) 0.63 MG/3ML NEBULIZER SOLUTION    Take 3 mLs (0.63 mg total) by nebulization every 6 (six) hours as needed for wheezing or shortness of breath.   LEVETIRACETAM (KEPPRA) 500 MG TABLET    Take 1 tablet (500 mg total) by mouth 2 (two) times daily.   RIVAROXABAN (XARELTO) 20 MG TABS TABLET    Take 1 tablet (20 mg total) by mouth daily with  supper.   SENNA (SENOKOT) 8.6 MG TABS TABLET    Take 1 tablet by mouth every morning.   SERTRALINE (ZOLOFT) 50 MG TABLET    Take 50 mg by mouth every morning.    TAMSULOSIN (FLOMAX) 0.4 MG CAPS CAPSULE    Take 1 capsule (0.4 mg total) by mouth daily.  Modified Medications   No medications on file  Discontinued Medications   ALPRAZOLAM (XANAX) 0.5 MG TABLET    Take 1 tablet (0.5 mg total) by mouth at bedtime as needed.     SIGNIFICANT DIAGNOSTIC EXAMS   09-08-15: ct of head and cervical spine: 1. No evidence of acute intracranial or cervical spine injury. 2. Extensive chronic ischemic injury as described, stable from 12/21/2014 comparison.  09-09-15: EEG: Clinical Interpretation: This essentially normal EEG is recorded in the waking and sleep state. There was no seizure or seizure predisposition recorded on this study. Please note that a normal EEG does not preclude the possibility of epilepsy.  09-29-15 ct of head: No acute abnormality. Extensive atrophy and multiple remote infarctions as seen on prior exams  09-30-15: swallow study: Regular solids;Thin liquid   10-05-15: chest x-ray: Retrocardiac density may represent atelectasis versus infiltrate.  10-06-15: right upper quad ultrasound: 1.  Small gallstone.  No biliary distention. 2. Liver is slightly echogenic suggesting fatty infiltration .  10-06-15: 2-d echo:   - Left ventricle: The cavity size was normal. Systolic function was moderately to severely reduced. The estimated ejection fraction was in the range of 30% to 35%. Diffuse hypokinesis. Doppler parameters are consistent with abnormal left ventricular relaxation (grade 1 diastolic dysfunction). There was no evidence of elevated ventricular filling pressure by Doppler parameters. - Aortic valve: There was mild regurgitation. - Aortic root: The aortic root was normal in size. - Mitral valve: Structurally normal valve. There was mild regurgitation. - Left atrium: The atrium was  mildly dilated. - Right ventricle: Pacer wire or catheter noted in right ventricle. Systolic function was normal. - Right atrium: The atrium was moderately dilated. Pacer wire or catheter noted in right atrium. - Tricuspid valve: There was mild regurgitation. - Pulmonary arteries: Systolic pressure was mildly increased. PA peak pressure: 39 mm Hg (S). - Inferior vena cava: The vessel was dilated. The respirophasic diameter changes were blunted (<  50%), consistent with elevated central venous pressure. - Pericardium, extracardiac: There was no pericardial effusion. Impressions: - When compared to the prior study from 05/31/14 the LVEF has improved, now 30-35% with diffuse hypokinesis.  10-11-15: chest x-ray: right lower lobe atelectasis; no active TB.   11-16-15: left hip and pelvis: no acute osseous abnormalities.   03-26-16: ct of head: 1. Old cerebral and cerebellar infarcts without acute findings. 2. Periventricular white matter and corona radiata hypodensities favor chronic ischemic microvascular white matter disease.   03-27-16: EEG: This awake and drowsy EEG is abnormal due to the presence of: 1. Mild diffuse slowing of the waking background 2. Focal slowing over the left temporal region 3. Rare sharp waves over the left temporal region Clinical Correlation of the above findings indicates diffuse cerebral dysfunction that is non-specific in etiology and can be seen with hypoxic/ischemic injury, toxic/metabolic encephalopathies, neurodegenerative disorders, or medication effect. Focal slowing over the left temporal region indicates focal cerebral dysfunction in this region suggestive of underlying structural or physiologic abnormality. There is a possible tendency for seizures to arise from this region. There were no electrographic seizures in this study. Clinical correlation is advised.    LABS REVIEWED:   09-08-15: wbc 7.2; hgb 7.3; hct 26.5; mcv 69.6; plt clump; glucose 218; bun 15;  creat 1.31; k+ 4.0; na++ 137; liver normal albumin 3.0; mag 1.7; hgb a1c 6.8; tsh 1.135 09-10-15: wbc 9.8; hgb 9.0; hxct 31.2. mcv 71.6; plt 179; glucose 75; bun 14; creat 1.16; k+ 4.1; na++ 139 09-11-15: wbc 10.4; hg 8.7; hct 30.1; mcv 72.5; plt 208  09-15-15: wbc 9.2; hgb 9.8; hct 35.6; mcv 72.0; plt 251; glucose 133; bun 18.1; creat 1.04; k+ 4.6; na++ 140; liver normal albumin 3.0; chol 135; ldl 37; tri 82; hdl 81; hgb a1c 5.5 PSA 3.60  10-08-15: wbc 13.7; hgb 9.9; hct 33.3; mcv 74.0; plt 375; glucose 113; bun 23; creat 1.20; k+ 4.6; na++ 136  03-26-16: wbc 11.4 hgb 10.0; hct 34.5; mcv 71.7; plt 216; glucose 136; bun 16; creat 1.45; k+ 4.2; na++ 138; liver normal albumin 3.2 03-28-16: glucose 114; bun 20; creat 1.45; k+ 4.2; na++ 138  04-01-16: wbc 6.3; hgb 9.9; hct 35.4; mcv 75.0; plt 210; glucose 86; bun 17.8; creat 1.15; k+ 4.7; na++ 140; liver normal albumin 3.8; chol 157; ldl 67; trig 119; hdl 66 04-04-16: wbc 10.1; hgb 10.5; hct 37.3; mcv 75.4; plt 229; glucose 199; bun 19.6; creat 1.28; k+ 4.2; na++ 140    Review of Systems  Unable to perform ROS: dementia    Physical Exam  Constitutional: No distress.  Frail   Eyes: Conjunctivae are normal.  Neck: Neck supple. No JVD present. No thyromegaly present.  Cardiovascular: Normal rate, regular rhythm and intact distal pulses.   Respiratory: Effort normal and breath sounds normal. No respiratory distress. He has no wheezes.  GI: Soft. Bowel sounds are normal. He exhibits no distension. There is no tenderness.  Musculoskeletal: He exhibits no edema.  Left hemiparesis  Lymphadenopathy:    He has no cervical adenopathy.  Neurological: He is alert.  Skin: Skin is warm and dry. He is not diaphoretic.  Psychiatric: He has a normal mood and affect.    ASSESSMENT/ PLAN:  1.  Bilateral lower extremity pain: will continue tylenol 1 gm  daily for pain and will monitor his status   2. Dyslipidemia: will continue lipitor 10 mg daily ldl is 67    3. Dementia: without significant change; his current weight  is 125 pounds is presently stable; will continue aricept 10 mg nightly   4. BPH: will continue flomax 0.4 mg daily   5. Chronic diastoic heart failure: EF 30-35% (10-06-15) is status post ICD placement (interrogated; now pace maker function only)   is currently not on medications; will not make changes will monitor  6. PE: is stable will continue xarelto 20 mg daily   7. CVA: is neurologically stable will continue xarelto 20 mg daily   8. Depression: will continue zoloft 50 mg daily and takes xanax 0.5 mg daily as needed   9. CAD: no indications of chest pain present; will continue asa 81 mg daily  10. Glaucoma: will continue xalatan to both eyes.   11. Constipation: will continue senna daily   12. COPD: has xopenex neb every 6 hours as needed  will not make changes will monitor his status.   13. CKD stage III: bun/creat 19.6/1.28   14. Hypotension: is currently not on medications; will not make changes will monitor   15. Seizures: no further seizure activities; will continue keppra 500 mg twice daily and will monitor  16. Diabetes: currently not on medications; unable to tolerate ace due to low blood pressure     MD is aware of resident's narcotic use and is in agreement with current plan of care. We will attempt to wean resident as apropriate   Ok Edwards NP [facility]  Contact [phone] Monday through Friday 8am- 5pm  After hours call [phone]

## 2016-06-03 ENCOUNTER — Non-Acute Institutional Stay (SKILLED_NURSING_FACILITY): Payer: Medicare Other | Admitting: Internal Medicine

## 2016-06-03 ENCOUNTER — Encounter: Payer: Self-pay | Admitting: Internal Medicine

## 2016-06-03 DIAGNOSIS — F0150 Vascular dementia without behavioral disturbance: Secondary | ICD-10-CM

## 2016-06-03 DIAGNOSIS — R627 Adult failure to thrive: Secondary | ICD-10-CM | POA: Diagnosis not present

## 2016-06-03 DIAGNOSIS — R269 Unspecified abnormalities of gait and mobility: Secondary | ICD-10-CM | POA: Diagnosis not present

## 2016-06-03 DIAGNOSIS — N183 Chronic kidney disease, stage 3 (moderate): Secondary | ICD-10-CM | POA: Diagnosis not present

## 2016-06-03 DIAGNOSIS — M79671 Pain in right foot: Secondary | ICD-10-CM

## 2016-06-03 DIAGNOSIS — E1122 Type 2 diabetes mellitus with diabetic chronic kidney disease: Secondary | ICD-10-CM

## 2016-06-03 DIAGNOSIS — M79672 Pain in left foot: Secondary | ICD-10-CM

## 2016-06-03 NOTE — Progress Notes (Signed)
Patient ID: Grant Lara, male   DOB: 08-07-1940, 76 y.o.   MRN: [id number]    DATE:  06/03/2016  Location:    [facility] Room Number: [place] of Service: SNF (31)   Extended Emergency Contact Information Primary Emergency Contact: Buerkle,[name] Address: [address]          [place], [place] [address] [place] Phone: [phone] Mobile Phone: [phone] Relation: Spouse Secondary Emergency Contact: [name],[name]          [place], [place] Mobile Phone: [phone] Relation: Daughter  Advanced Directive information Does Patient Have a Medical Advance Directive?: Yes, Type of Advance Directive: Out of facility DNR (pink MOST or yellow form), Pre-existing out of facility DNR order (yellow form or pink MOST form): Pink MOST form placed in chart (order not valid for inpatient use) (Attempt CPR)  Chief Complaint  Patient presents with  . Foot Problem    HPI:  76 yo male long term resident seen today for toe/foot pain x unknown duration. Pain worsens with weightbearing and improves when he sits down and elevates feet. He reports pain is like pins and needles, sharp but no dull or achy sensation. No fall in the last week but he did fall towards the end of January 2018. He is a poor historian due to dementia. Hx obtained from chart. He does have a hx b/l LE pain and takes tylenol daily for pain  Dyslipidemia - stable on lipitor 10 mg daily. LDL 67  Dementia - stable on aricept 10 mg nightly   BPH - stable on flomax 0.4 mg daily   Chronic diastoic heart failure - EF 30-35% (10-06-15). He is s/p ICD placement (interrogated; now pace maker function only). He is asymptomatic and does not take any meds  Hx PE - stable on xarelto 20 mg daily. No signs of bleeding  Hx CVA - stable. He takes xarelto 20 mg daily   Depression - stable on zoloft 50 mg daily and takes xanax 0.5 mg daily as needed   CAD - no angina sx's. Takes ASA 81 mg  daily  Glaucoma - stable on xalatan to both eyes. Followed by eye specialist  Constipation - stable on senna daily   COPD - no exacerbations. He has xopenex neb every 6 hours as needed    CKD - stage 3. Cr 1.28   Hypotension - no recent issues  Seizures - no recent sz. He takes keppra 500 mg twice daily  DM - diet controlled  Past Medical History:  Diagnosis Date  . AICD (automatic cardioverter/defibrillator) present   . Arthritis    "used to have a touch in my legs"  . Bradycardia 06/01/2014  . CAD (coronary artery disease)    LHC 9/05 with Dr. Einar Gip:  dLM 20-30%, LAD 85%, oD1 20-30%.  PCI:  Taxus DES to LAD; Dx jailed and tx with POBA.  Last myoview 12/10: inf scar, no ischemia, EF 29%.  . CHF (congestive heart failure) ([place])   . Crohn's disease ([facility])   . Dementia   . Diabetes mellitus    11/05/11 "borderline; don't take medications"  . Dilated cardiomyopathy ([place])    2/12: EF 30-35%, trivial AI, mild RAE.  EF 2014 40 -45%  . DVT (deep venous thrombosis) ([place])   . HCAP (healthcare-associated pneumonia) 09/29/2015  . HLD (hyperlipidemia)   . 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 Hyperlipidemia   . Hypertension   . Memory difficulties   .  Nephrolithiasis   . PFO (patent foramen ovale)    Not mentioned on 2014 echo.  . Pneumonia ~ 2011   09-22-13 denies any recent SOB or breathing problems  . Pulmonary embolus (HCC)    chronic coumadin  . Stroke [facility] 1993   "left arm can't hold steady; leg too"  . Swelling of both ankles     09-22-13 occ.feet, but denies pain.    Past Surgical History:  Procedure Laterality Date  . APPENDECTOMY    . COLON SURGERY  1994; 1996   "for Crohn's disease"  . IMPLANTABLE CARDIOVERTER DEFIBRILLATOR IMPLANT N/A 06/02/2014   Procedure: IMPLANTABLE CARDIOVERTER DEFIBRILLATOR IMPLANT;  Surgeon: [name], MD;  Location: [facility] CATH LAB;  Service: Cardiovascular;  Laterality: N/A;    Patient Care Team: Maury Dus, MD as PCP - General (Family  Medicine)  Social History   Social History  . Marital status: Married    Spouse name: N/A  . Number of children: N/A  . Years of education: N/A   Occupational History  . Not on file.   Social History Main Topics  . Smoking status: Current Some Day Smoker    Packs/day: 0.50    Years: 53.00    Types: Cigarettes  . Smokeless tobacco: Never Used  . Alcohol use No  . Drug use: No  . Sexual activity: Not Currently   Other Topics Concern  . Not on file   Social History Narrative  . No narrative on file     reports that he has been smoking Cigarettes.  He has a 26.50 pack-year smoking history. He has never used smokeless tobacco. He reports that he does not drink alcohol or use drugs.  Family History  Problem Relation Age of Onset  . Diabetes type II Mother   . CAD Father   . Diabetes type II Brother    Family Status  Relation Status  . Mother Deceased  . Father Deceased  . Paternal Grandfather Deceased  . Paternal Grandmother Deceased  . Maternal Grandfather Deceased  . Maternal Grandmother Deceased  . Brother     Immunization History  Administered Date(s) Administered  . Influenza Whole 02/09/2010  . Influenza-Unspecified 01/21/2016  . PPD Test 11/05/2011, 10/09/2015  . Pneumococcal Polysaccharide-23 02/19/2010    Allergies  Allergen Reactions  . Tuberculin Tests Other (See Comments)    Per MAR (no description provided)    Medications: Patient's Medications  New Prescriptions   No medications on file  Previous Medications   ACETAMINOPHEN (TYLENOL) 500 MG TABLET    Take 1,000 mg by mouth daily.    ALPRAZOLAM (XANAX) 0.5 MG TABLET    Take 0.5 mg by mouth daily as needed for anxiety.   ASPIRIN 81 MG TABLET    Take 81 mg by mouth daily.   ATORVASTATIN (LIPITOR) 10 MG TABLET    Take 1 tablet (10 mg total) by mouth daily at 6 PM.   DONEPEZIL (ARICEPT) 10 MG TABLET    Take 10 mg by mouth at bedtime.    FEEDING SUPPLEMENT, ENSURE ENLIVE, (ENSURE ENLIVE) LIQD     Take 237 mLs by mouth 2 (two) times daily between meals.   LATANOPROST (XALATAN) 0.005 % OPHTHALMIC SOLUTION    Place 1 drop into both eyes at bedtime.   LEVALBUTEROL (XOPENEX) 0.63 MG/3ML NEBULIZER SOLUTION    Take 3 mLs (0.63 mg total) by nebulization every 6 (six) hours as needed for wheezing or shortness of breath.   LEVETIRACETAM (KEPPRA) 500 MG  TABLET    Take 1 tablet (500 mg total) by mouth 2 (two) times daily.   RIVAROXABAN (XARELTO) 20 MG TABS TABLET    Take 1 tablet (20 mg total) by mouth daily with supper.   SENNA (SENOKOT) 8.6 MG TABS TABLET    Take 1 tablet by mouth every morning.   SERTRALINE (ZOLOFT) 50 MG TABLET    Take 50 mg by mouth every morning.    TAMSULOSIN (FLOMAX) 0.4 MG CAPS CAPSULE    Take 1 capsule (0.4 mg total) by mouth daily.  Modified Medications   No medications on file  Discontinued Medications   No medications on file    Review of Systems  Unable to perform ROS: Dementia    Vitals:   06/03/16 1420  BP: 117/62  Pulse: 68  Resp: 17  Temp: 97.4 F (36.3 C)  TempSrc: Oral  SpO2: 97%  Weight: 131 lb 9.6 oz (59.7 kg)  Height: _0  (1.6 m)   Body mass index is 23.31 kg/m.  Physical Exam  Constitutional: He appears well-developed.  Frail appearing in NAD, lying in bed  Cardiovascular:  DP/PT pulses intact b/l. No swelling in foot/ankle; no calf TTP  Musculoskeletal: He exhibits edema and tenderness.  Increased swelling and TTP right 1st/2nd MTP joint and left 2nd MTP joint TTP and swelling; FROM at ankle b/l; no bony deformities  Neurological: He is alert.  Skin: Skin is warm and dry. No rash noted.  Left plantar calluses but no ulcerations  Psychiatric: He has a normal mood and affect. His behavior is normal.     Labs reviewed: Admission on 05/11/2016, Discharged on 05/11/2016  Component Date Value Ref Range Status  . Sodium 05/11/2016 135  135 - 145 mmol/L Final  . Potassium 05/11/2016 4.5  3.5 - 5.1 mmol/L Final  . Chloride  05/11/2016 104  101 - 111 mmol/L Final  . CO2 05/11/2016 24  22 - 32 mmol/L Final  . Glucose, Bld 05/11/2016 190* 65 - 99 mg/dL Final  . BUN 05/11/2016 18  6 - 20 mg/dL Final  . Creatinine, Ser 05/11/2016 1.44* 0.61 - 1.24 mg/dL Final  . Calcium 05/11/2016 9.4  8.9 - 10.3 mg/dL Final  . GFR calc non Af Amer 05/11/2016 45* >60 mL/min Final  . GFR calc Af Amer 05/11/2016 53* >60 mL/min Final   Comment: (NOTE) The eGFR has been calculated using the CKD EPI equation. This calculation has not been validated in all clinical situations. eGFR's persistently <60 mL/min signify possible Chronic Kidney Disease.   . Anion gap 05/11/2016 7  5 - 15 Final  . WBC 05/11/2016 9.4  4.0 - 10.5 K/uL Final  . RBC 05/11/2016 4.67  4.22 - 5.81 MIL/uL Final  . Hemoglobin 05/11/2016 9.8* 13.0 - 17.0 g/dL Final  . HCT 05/11/2016 33.6* 39.0 - 52.0 % Final  . MCV 05/11/2016 71.9* 78.0 - 100.0 fL Final  . MCH 05/11/2016 21.0* 26.0 - 34.0 pg Final  . MCHC 05/11/2016 29.2* 30.0 - 36.0 g/dL Final  . RDW 05/11/2016 17.7* 11.5 - 15.5 % Final  . Platelets 05/11/2016 198  150 - 400 K/uL Final  . Color, Urine 05/11/2016 AMBER* YELLOW Final  . APPearance 05/11/2016 CLOUDY* CLEAR Final  . Specific Gravity, Urine 05/11/2016 1.027  1.005 - 1.030 Final  . pH 05/11/2016 5.0  5.0 - 8.0 Final  . Glucose, UA 05/11/2016 50* NEGATIVE mg/dL Final  . Hgb urine dipstick 05/11/2016 LARGE* NEGATIVE Final  . Bilirubin Urine 05/11/2016  NEGATIVE  NEGATIVE Final  . Ketones, ur 05/11/2016 NEGATIVE  NEGATIVE mg/dL Final  . Protein, ur 05/11/2016 100* NEGATIVE mg/dL Final  . Nitrite 05/11/2016 NEGATIVE  NEGATIVE Final  . Leukocytes, UA 05/11/2016 NEGATIVE  NEGATIVE Final  . RBC / HPF 05/11/2016 TOO NUMEROUS TO COUNT  0 - 5 RBC/hpf Final  . WBC, UA 05/11/2016 6-30  0 - 5 WBC/hpf Final  . Bacteria, UA 05/11/2016 MANY* NONE SEEN Final  . Squamous Epithelial / LPF 05/11/2016 0-5* NONE SEEN Final  . Mucous 05/11/2016 PRESENT   Final  .  Hyaline Casts, UA 05/11/2016 PRESENT   Final  . Ca Oxalate Crys, UA 05/11/2016 PRESENT   Final  . Glucose-Capillary 05/11/2016 172* 65 - 99 mg/dL Final  . Comment 1 05/11/2016 Notify RN   Final  . Comment 2 05/11/2016 Document in Chart   Final  Lab on 04/12/2016  Component Date Value Ref Range Status  . Hemoglobin 04/04/2016 10.5* 13.5 - 17.5 g/dL Final  . HCT 04/04/2016 37* 41 - 53 % Final  . Platelets 04/04/2016 229  150 - 399 K/L Final  . WBC 04/04/2016 10.1  10^3/mL Final  . Glucose 04/04/2016 199  mg/dL Final  . BUN 04/04/2016 20  4 - 21 mg/dL Final  . Creatinine 04/04/2016 1.3  0.6 - 1.3 mg/dL Final  . Potassium 04/04/2016 4.2  3.4 - 5.3 mmol/L Final  . Sodium 04/04/2016 140  137 - 147 mmol/L Final  Admission on 03/26/2016, Discharged on 03/28/2016  Component Date Value Ref Range Status  . Sodium 03/26/2016 138  135 - 145 mmol/L Final  . Potassium 03/26/2016 4.2  3.5 - 5.1 mmol/L Final  . Chloride 03/26/2016 107  101 - 111 mmol/L Final  . CO2 03/26/2016 23  22 - 32 mmol/L Final  . Glucose, Bld 03/26/2016 136* 65 - 99 mg/dL Final  . BUN 03/26/2016 16  6 - 20 mg/dL Final  . Creatinine, Ser 03/26/2016 1.45* 0.61 - 1.24 mg/dL Final  . Calcium 03/26/2016 9.3  8.9 - 10.3 mg/dL Final  . Total Protein 03/26/2016 7.1  6.5 - 8.1 g/dL Final  . Albumin 03/26/2016 3.2* 3.5 - 5.0 g/dL Final  . AST 03/26/2016 25  15 - 41 U/L Final  . ALT 03/26/2016 30  17 - 63 U/L Final  . Alkaline Phosphatase 03/26/2016 91  38 - 126 U/L Final  . Total Bilirubin 03/26/2016 0.7  0.3 - 1.2 mg/dL Final  . GFR calc non Af Amer 03/26/2016 45* >60 mL/min Final  . GFR calc Af Amer 03/26/2016 52* >60 mL/min Final   Comment: (NOTE) The eGFR has been calculated using the CKD EPI equation. This calculation has not been validated in all clinical situations. eGFR's persistently <60 mL/min signify possible Chronic Kidney Disease.   . Anion gap 03/26/2016 8  5 - 15 Final  . WBC 03/26/2016 11.4* 4.0 - 10.5 K/uL  Final  . RBC 03/26/2016 4.81  4.22 - 5.81 MIL/uL Final  . Hemoglobin 03/26/2016 10.0* 13.0 - 17.0 g/dL Final  . HCT 03/26/2016 34.5* 39.0 - 52.0 % Final  . MCV 03/26/2016 71.7* 78.0 - 100.0 fL Final  . MCH 03/26/2016 20.8* 26.0 - 34.0 pg Final  . MCHC 03/26/2016 29.0* 30.0 - 36.0 g/dL Final  . RDW 03/26/2016 17.5* 11.5 - 15.5 % Final  . Platelets 03/26/2016 216  150 - 400 K/uL Final  . Neutrophils Relative % 03/26/2016 80  % Final  . Lymphocytes Relative 03/26/2016 11  %  Final  . Monocytes Relative 03/26/2016 8  % Final  . Eosinophils Relative 03/26/2016 1  % Final  . Basophils Relative 03/26/2016 0  % Final  . Neutro Abs 03/26/2016 9.1* 1.7 - 7.7 K/uL Final  . Lymphs Abs 03/26/2016 1.3  0.7 - 4.0 K/uL Final  . Monocytes Absolute 03/26/2016 0.9  0.1 - 1.0 K/uL Final  . Eosinophils Absolute 03/26/2016 0.1  0.0 - 0.7 K/uL Final  . Basophils Absolute 03/26/2016 0.0  0.0 - 0.1 K/uL Final  . RBC Morphology 03/26/2016 ELLIPTOCYTES   Final  . Troponin i, poc 03/26/2016 0.01  0.00 - 0.08 ng/mL Final  . Comment 3 03/26/2016          Final   Comment: Due to the release kinetics of cTnI, a negative result within the first hours of the onset of symptoms does not rule out myocardial infarction with certainty. If myocardial infarction is still suspected, repeat the test at appropriate intervals.   [name] MRSA by PCR 03/27/2016 NEGATIVE  NEGATIVE Final   Comment:        The GeneXpert MRSA Assay (FDA approved for NASAL specimens only), is one component of a comprehensive MRSA colonization surveillance program. It is not intended to diagnose MRSA infection nor to guide or monitor treatment for MRSA infections.   . Sodium 03/28/2016 138  135 - 145 mmol/L Final  . Potassium 03/28/2016 4.2  3.5 - 5.1 mmol/L Final  . Chloride 03/28/2016 105  101 - 111 mmol/L Final  . CO2 03/28/2016 26  22 - 32 mmol/L Final  . Glucose, Bld 03/28/2016 114* 65 - 99 mg/dL Final  . BUN 03/28/2016 20  6 - 20 mg/dL  Final  . Creatinine, Ser 03/28/2016 1.43* 0.61 - 1.24 mg/dL Final  . Calcium 03/28/2016 9.4  8.9 - 10.3 mg/dL Final  . GFR calc non Af Amer 03/28/2016 46* >60 mL/min Final  . GFR calc Af Amer 03/28/2016 53* >60 mL/min Final   Comment: (NOTE) The eGFR has been calculated using the CKD EPI equation. This calculation has not been validated in all clinical situations. eGFR's persistently <60 mL/min signify possible Chronic Kidney Disease.   . Anion gap 03/28/2016 7  5 - 15 Final    Ct Head Wo Contrast  Result Date: 05/11/2016 CLINICAL DATA:  Fall with trauma to the head.  Patient on Xarelto. EXAM: CT HEAD WITHOUT CONTRAST TECHNIQUE: Contiguous axial images were obtained from the base of the skull through the vertex without intravenous contrast. COMPARISON:  03/26/2016 FINDINGS: Brain: No evidence of acute infarction, hemorrhage, hydrocephalus, extra-axial collection or mass lesion/mass effect. Old cerebellar and cerebral infarcts are stable. Moderate brain parenchymal volume loss. Mild periventricular microangiopathy. Vascular: Calcific atherosclerotic disease at the skullbase. Skull: Normal. Negative for fracture or focal lesion. Sinuses/Orbits: No acute finding. Other: None. IMPRESSION: No acute intracranial abnormality. Chronic brain parenchymal atrophy, periventricular microvascular disease, and encephalomalacia from old cerebral and cerebellar infarct. Electronically Signed   By: Fidela Salisbury M.D.   On: 05/11/2016 16:37   Ct Cervical Spine Wo Contrast  Result Date: 05/11/2016 CLINICAL DATA:  Status post fall. EXAM: CT CERVICAL SPINE WITHOUT CONTRAST TECHNIQUE: Multidetector CT imaging of the cervical spine was performed without intravenous contrast. Multiplanar CT image reconstructions were also generated. COMPARISON:  09/08/2015 FINDINGS: Alignment: Normal. Skull base and vertebrae: No acute fracture. No primary bone lesion or focal pathologic process. 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 Fragmentation of the posterior aspect of the posterior processes of C7  and T1, either congenital or remote posttraumatic changes. Soft tissues and spinal canal: No prevertebral fluid or swelling. No visible canal hematoma. Disc levels: Multilevel osteoarthritic changes, most prominent at C4-C5-C5-C6 and C6-C7, with disc space narrowing, vertebral body remodeling and large osteophyte formation. Upper chest: Negative. Other: None. IMPRESSION: No evidence of acute traumatic injury to the cervical spine. Multilevel osteoarthritic changes. Electronically Signed   By: Fidela Salisbury M.D.   On: 05/11/2016 18:43     Assessment/Plan   ICD-9-CM ICD-10-CM   1. Foot pain, bilateral 729.5 M79.671     M79.672   2. Gait abnormality 781.2 R26.9   3. FTT (failure to thrive) in adult 783.7 R62.7   4. Type 2 diabetes mellitus with stage 3 chronic kidney disease, without long-term current use of insulin (HCC) 250.40 E11.22    585.3 N18.3   5. Vascular dementia without behavioral disturbance 290.40 F01.50     Check b/l foot xray  Start voltaren gel 1% apply to foot b/l 4 grns QID for joint pain  T/c Ortho vs podiatry referral if no better  T/c PT/OT for nonpharmacologic pain control recommendations  Optimize BS control  Cont other meds as ordered  Will follow   Millee Denise S. Perlie Gold  [facility] [address] [place], [place] [address] [phone] Cell (Monday-Friday 8 AM - 5 PM) [phone] After 5 PM and follow prompts

## 2016-06-04 ENCOUNTER — Encounter: Payer: Self-pay | Admitting: Adult Health

## 2016-06-04 ENCOUNTER — Non-Acute Institutional Stay (SKILLED_NURSING_FACILITY): Payer: Medicare Other | Admitting: Adult Health

## 2016-06-04 DIAGNOSIS — F0150 Vascular dementia without behavioral disturbance: Secondary | ICD-10-CM

## 2016-06-04 DIAGNOSIS — I6340 Cerebral infarction due to embolism of unspecified cerebral artery: Secondary | ICD-10-CM

## 2016-06-04 DIAGNOSIS — I825Z9 Chronic embolism and thrombosis of unspecified deep veins of unspecified distal lower extremity: Secondary | ICD-10-CM

## 2016-06-04 DIAGNOSIS — I2510 Atherosclerotic heart disease of native coronary artery without angina pectoris: Secondary | ICD-10-CM

## 2016-06-04 DIAGNOSIS — Z95810 Presence of automatic (implantable) cardiac defibrillator: Secondary | ICD-10-CM

## 2016-06-04 DIAGNOSIS — I2782 Chronic pulmonary embolism: Secondary | ICD-10-CM

## 2016-06-04 DIAGNOSIS — I110 Hypertensive heart disease with heart failure: Secondary | ICD-10-CM

## 2016-06-04 DIAGNOSIS — I5042 Chronic combined systolic (congestive) and diastolic (congestive) heart failure: Secondary | ICD-10-CM

## 2016-06-04 DIAGNOSIS — J449 Chronic obstructive pulmonary disease, unspecified: Secondary | ICD-10-CM

## 2016-06-04 NOTE — Progress Notes (Signed)
Location:   [place] Room Number: 107 B Place of Service:  SNF (31)   CODE STATUS: Full Code  Allergies  Allergen Reactions  . Tuberculin Tests Other (See Comments)    Per MAR (no description provided)    Chief Complaint  Patient presents with  . Medical Management of Chronic Issues    routine follow up    HPI:  He is a long term resident of this facility being seen for the management of his chronic illnesses. He is unable to fully participate in the hpi or ros; but does tell me that his feet hurt all the time. He can be resistant to nursing care at times. He does get out of bed daily. There are no nursing concerns at this time.    Past Medical History:  Diagnosis Date  . AICD (automatic cardioverter/defibrillator) present   . Arthritis    "used to have a touch in my legs"  . Bradycardia 06/01/2014  . CAD (coronary artery disease)    LHC 9/05 with Dr. Einar Gip:  dLM 20-30%, LAD 85%, oD1 20-30%.  PCI:  Taxus DES to LAD; Dx jailed and tx with POBA.  Last myoview 12/10: inf scar, no ischemia, EF 29%.  . CHF (congestive heart failure) ([place])   . Crohn's disease ([place])   . Dementia   . Diabetes mellitus    11/05/11 "borderline; don't take medications"  . Dilated cardiomyopathy ([place])    2/12: EF 30-35%, trivial AI, mild RAE.  EF 2014 40 -45%  . DVT (deep venous thrombosis) ([place])   . HCAP (healthcare-associated pneumonia) 09/29/2015  . HLD (hyperlipidemia)   . 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 Hyperlipidemia   . Hypertension   . Memory difficulties   . Nephrolithiasis   . PFO (patent foramen ovale)    Not mentioned on 2014 echo.  . Pneumonia ~ 2011   09-22-13 denies any recent SOB or breathing problems  . Pulmonary embolus ([facility])    chronic coumadin  . Stroke [facility]) 1993   "left arm can't hold steady; leg too"  . Swelling of both ankles     09-22-13 occ.feet, but denies pain.    Past Surgical History:  Procedure Laterality Date  . APPENDECTOMY    . COLON SURGERY   1994; 1996   "for Crohn's disease"  . IMPLANTABLE CARDIOVERTER DEFIBRILLATOR IMPLANT N/A 06/02/2014   Procedure: IMPLANTABLE CARDIOVERTER DEFIBRILLATOR IMPLANT;  Surgeon: [name], MD;  Location: [facility] CATH LAB;  Service: Cardiovascular;  Laterality: N/A;    Social History   Social History  . Marital status: Married    Spouse name: N/A  . Number of children: N/A  . Years of education: N/A   Occupational History  . Not on file.   Social History Main Topics  . Smoking status: Current Some Day Smoker    Packs/day: 0.50    Years: 53.00    Types: Cigarettes  . Smokeless tobacco: Never Used  . Alcohol use No  . Drug use: No  . Sexual activity: Not Currently   Other Topics Concern  . Not on file   Social History Narrative  . No narrative on file   Family History  Problem Relation Age of Onset  . Diabetes type II Mother   . CAD Father   . Diabetes type II Brother     Vitals:   06/04/16 0940  BP: 117/62  Pulse: 68  Resp: 17  Temp: 97.4 F (36.3 C)  SpO2: 97%  Weight:  131 lb 10 oz (59.7 kg)  Height: 5' 3"  (1.6 m)      Patient's Medications  New Prescriptions   No medications on file  Previous Medications   ACETAMINOPHEN (TYLENOL) 500 MG TABLET    Take 1,000 mg by mouth daily.    ALPRAZOLAM (XANAX) 0.5 MG TABLET    Take 0.5 mg by mouth daily as needed for anxiety.   ASPIRIN 81 MG TABLET    Take 81 mg by mouth daily.   ATORVASTATIN (LIPITOR) 10 MG TABLET    Take 1 tablet (10 mg total) by mouth daily at 6 PM.   DONEPEZIL (ARICEPT) 10 MG TABLET    Take 10 mg by mouth at bedtime.    FEEDING SUPPLEMENT, ENSURE ENLIVE, (ENSURE ENLIVE) LIQD    Take 237 mLs by mouth 2 (two) times daily between meals.   LATANOPROST (XALATAN) 0.005 % OPHTHALMIC SOLUTION    Place 1 drop into both eyes at bedtime.   LEVALBUTEROL (XOPENEX) 0.63 MG/3ML NEBULIZER SOLUTION    Take 3 mLs (0.63 mg total) by nebulization every 6 (six) hours as needed for wheezing or shortness of breath.    LEVETIRACETAM (KEPPRA) 500 MG TABLET    Take 1 tablet (500 mg total) by mouth 2 (two) times daily.   RIVAROXABAN (XARELTO) 20 MG TABS TABLET    Take 1 tablet (20 mg total) by mouth daily with supper.   SENNA (SENOKOT) 8.6 MG TABS TABLET    Take 1 tablet by mouth every morning.   SERTRALINE (ZOLOFT) 50 MG TABLET    Take 50 mg by mouth every morning.    TAMSULOSIN (FLOMAX) 0.4 MG CAPS CAPSULE    Take 1 capsule (0.4 mg total) by mouth daily.  Modified Medications   No medications on file  Discontinued Medications   No medications on file     SIGNIFICANT DIAGNOSTIC EXAMS  09-08-15: ct of head and cervical spine: 1. No evidence of acute intracranial or cervical spine injury. 2. Extensive chronic ischemic injury as described, stable from 12/21/2014 comparison.  09-09-15: EEG: Clinical Interpretation: This essentially normal EEG is recorded in the waking and sleep state. There was no seizure or seizure predisposition recorded on this study. Please note that a normal EEG does not preclude the possibility of epilepsy.  09-29-15 ct of head: No acute abnormality. Extensive atrophy and multiple remote infarctions as seen on prior exams  09-30-15: swallow study: Regular solids;Thin liquid   10-05-15: chest x-ray: Retrocardiac density may represent atelectasis versus infiltrate.  10-06-15: right upper quad ultrasound: 1.  Small gallstone.  No biliary distention. 2. Liver is slightly echogenic suggesting fatty infiltration .  10-06-15: 2-d echo:   - Left ventricle: The cavity size was normal. Systolic function was moderately to severely reduced. The estimated ejection fraction was in the range of 30% to 35%. Diffuse hypokinesis. Doppler parameters are consistent with abnormal left ventricular relaxation (grade 1 diastolic dysfunction). There was no evidence of elevated ventricular filling pressure by Doppler parameters. - Aortic valve: There was mild regurgitation. - Aortic root: The aortic root was normal  in size. - Mitral valve: Structurally normal valve. There was mild regurgitation. - Left atrium: The atrium was mildly dilated. - Right ventricle: Pacer wire or catheter noted in right ventricle. Systolic function was normal. - Right atrium: The atrium was moderately dilated. Pacer wire or catheter noted in right atrium. - Tricuspid valve: There was mild regurgitation. - Pulmonary arteries: Systolic pressure was mildly increased. PA peak pressure: 39 mm  Hg (S). - Inferior vena cava: The vessel was dilated. The respirophasic diameter changes were blunted (< 50%), consistent with elevated central venous pressure. - Pericardium, extracardiac: There was no pericardial effusion. Impressions: - When compared to the prior study from 05/31/14 the LVEF has improved, now 30-35% with diffuse hypokinesis.  10-11-15: chest x-ray: right lower lobe atelectasis; no active TB.   11-16-15: left hip and pelvis: no acute osseous abnormalities.   03-26-16: ct of head: 1. Old cerebral and cerebellar infarcts without acute findings. 2. Periventricular white matter and corona radiata hypodensities favor chronic ischemic microvascular white matter disease.   03-27-16: EEG: This awake and drowsy EEG is abnormal due to the presence of: 1. Mild diffuse slowing of the waking background 2. Focal slowing over the left temporal region 3. Rare sharp waves over the left temporal region Clinical Correlation of the above findings indicates diffuse cerebral dysfunction that is non-specific in etiology and can be seen with hypoxic/ischemic injury, toxic/metabolic encephalopathies, neurodegenerative disorders, or medication effect. Focal slowing over the left temporal region indicates focal cerebral dysfunction in this region suggestive of underlying structural or physiologic abnormality. There is a possible tendency for seizures to arise from this region. There were no electrographic seizures in this study. Clinical correlation is  advised.    LABS REVIEWED:   09-08-15: wbc 7.2; hgb 7.3; hct 26.5; mcv 69.6; plt clump; glucose 218; bun 15; creat 1.31; k+ 4.0; na++ 137; liver normal albumin 3.0; mag 1.7; hgb a1c 6.8; tsh 1.135 09-10-15: wbc 9.8; hgb 9.0; hxct 31.2. mcv 71.6; plt 179; glucose 75; bun 14; creat 1.16; k+ 4.1; na++ 139 09-11-15: wbc 10.4; hg 8.7; hct 30.1; mcv 72.5; plt 208  09-15-15: wbc 9.2; hgb 9.8; hct 35.6; mcv 72.0; plt 251; glucose 133; bun 18.1; creat 1.04; k+ 4.6; na++ 140; liver normal albumin 3.0; chol 135; ldl 37; tri 82; hdl 81; hgb a1c 5.5 PSA 3.60  10-08-15: wbc 13.7; hgb 9.9; hct 33.3; mcv 74.0; plt 375; glucose 113; bun 23; creat 1.20; k+ 4.6; na++ 136  03-26-16: wbc 11.4 hgb 10.0; hct 34.5; mcv 71.7; plt 216; glucose 136; bun 16; creat 1.45; k+ 4.2; na++ 138; liver normal albumin 3.2 03-28-16: glucose 114; bun 20; creat 1.45; k+ 4.2; na++ 138  04-01-16: wbc 6.3; hgb 9.9; hct 35.4; mcv 75.0; plt 210; glucose 86; bun 17.8; creat 1.15; k+ 4.7; na++ 140; liver normal albumin 3.8; chol 157; ldl 67; trig 119; hdl 66 04-04-16: wbc 10.1; hgb 10.5; hct 37.3; mcv 75.4; plt 229; glucose 199; bun 19.6; creat 1.28; k+ 4.2; na++ 140    Review of Systems  Unable to perform ROS: dementia    Physical Exam  Constitutional: No distress.  Frail   Eyes: Conjunctivae are normal.  Neck: Neck supple. No JVD present. No thyromegaly present.  Cardiovascular: Normal rate, regular rhythm and intact distal pulses.   Respiratory: Effort normal and breath sounds normal. No respiratory distress. He has no wheezes.  GI: Soft. Bowel sounds are normal. He exhibits no distension. There is no tenderness.  Musculoskeletal: He exhibits no edema.  Left hemiparesis  Lymphadenopathy:    He has no cervical adenopathy.  Neurological: He is alert.  Skin: Skin is warm and dry. He is not diaphoretic.  Psychiatric: He has a normal mood and affect.    ASSESSMENT/ PLAN:  1.  Bilateral lower extremity pain:  Will change his tylenol  to 1 gm twice daily and will begin ultram 50 mg twice daily and will  monitor his status.   2. Dyslipidemia: will continue lipitor 10 mg daily ldl is 67   3. Dementia: without significant change; his current weight is 131 pounds is presently stable; will continue aricept 10 mg nightly   4. BPH: will continue flomax 0.4 mg daily   5. Chronic diastoic heart failure: EF 30-35% (10-06-15) is status post ICD placement (interrogated; now pace maker function only)   is currently not on medications; will not make changes will monitor  6. PE: is stable will continue xarelto 20 mg daily   7. CVA: is neurologically stable will continue xarelto 20 mg daily   8. Depression: will continue zoloft 50 mg daily and takes xanax 0.5 mg daily as needed   9. CAD: no indications of chest pain present; will continue asa 81 mg daily  10. Glaucoma: will continue xalatan to both eyes.   11. Constipation: will continue senna daily   12. COPD: has xopenex neb every 6 hours as needed  will not make changes will monitor his status.   13. CKD stage III: bun/creat 19.6/1.28   14. Hypotension: is currently not on medications; will not make changes will monitor   15. Seizures: no further seizure activities; will continue keppra 500 mg twice daily and will monitor  16. Diabetes: currently not on medications; unable to tolerate ace due to low blood pressure    Will check hgb a1c   MD is aware of resident's narcotic use and is in agreement with current plan of care. We will attempt to wean resident as apropriate   Ok Edwards NP [facility]  Contact [phone] Monday through Friday 8am- 5pm  After hours call [phone]

## 2016-06-05 ENCOUNTER — Encounter (HOSPITAL_COMMUNITY): Payer: Self-pay | Admitting: Emergency Medicine

## 2016-06-05 ENCOUNTER — Emergency Department (HOSPITAL_COMMUNITY): Payer: Medicare Other

## 2016-06-05 ENCOUNTER — Emergency Department (HOSPITAL_COMMUNITY)
Admission: EM | Admit: 2016-06-05 | Discharge: 2016-06-05 | Disposition: A | Discharge status: Home / Self Care | Payer: Medicare Other | Attending: Emergency Medicine | Admitting: Emergency Medicine

## 2016-06-05 DIAGNOSIS — R930 Abnormal findings on diagnostic imaging of skull and head, not elsewhere classified: Secondary | ICD-10-CM | POA: Insufficient documentation

## 2016-06-05 DIAGNOSIS — W19XXXA Unspecified fall, initial encounter: Secondary | ICD-10-CM | POA: Insufficient documentation

## 2016-06-05 DIAGNOSIS — F17210 Nicotine dependence, cigarettes, uncomplicated: Secondary | ICD-10-CM | POA: Diagnosis not present

## 2016-06-05 DIAGNOSIS — Y939 Activity, unspecified: Secondary | ICD-10-CM | POA: Insufficient documentation

## 2016-06-05 DIAGNOSIS — Z8673 Personal history of transient ischemic attack (TIA), and cerebral infarction without residual deficits: Secondary | ICD-10-CM | POA: Diagnosis not present

## 2016-06-05 DIAGNOSIS — I5042 Chronic combined systolic (congestive) and diastolic (congestive) heart failure: Secondary | ICD-10-CM | POA: Diagnosis not present

## 2016-06-05 DIAGNOSIS — S0081XA Abrasion of other part of head, initial encounter: Secondary | ICD-10-CM | POA: Insufficient documentation

## 2016-06-05 DIAGNOSIS — N183 Chronic kidney disease, stage 3 (moderate): Secondary | ICD-10-CM | POA: Insufficient documentation

## 2016-06-05 DIAGNOSIS — Z7902 Long term (current) use of antithrombotics/antiplatelets: Secondary | ICD-10-CM | POA: Insufficient documentation

## 2016-06-05 DIAGNOSIS — Y999 Unspecified external cause status: Secondary | ICD-10-CM | POA: Insufficient documentation

## 2016-06-05 DIAGNOSIS — I130 Hypertensive heart and chronic kidney disease with heart failure and stage 1 through stage 4 chronic kidney disease, or unspecified chronic kidney disease: Secondary | ICD-10-CM | POA: Diagnosis not present

## 2016-06-05 DIAGNOSIS — J449 Chronic obstructive pulmonary disease, unspecified: Secondary | ICD-10-CM | POA: Insufficient documentation

## 2016-06-05 DIAGNOSIS — Y929 Unspecified place or not applicable: Secondary | ICD-10-CM | POA: Insufficient documentation

## 2016-06-05 DIAGNOSIS — Z7982 Long term (current) use of aspirin: Secondary | ICD-10-CM | POA: Diagnosis not present

## 2016-06-05 DIAGNOSIS — E1122 Type 2 diabetes mellitus with diabetic chronic kidney disease: Secondary | ICD-10-CM | POA: Insufficient documentation

## 2016-06-05 DIAGNOSIS — I2510 Atherosclerotic heart disease of native coronary artery without angina pectoris: Secondary | ICD-10-CM | POA: Diagnosis not present

## 2016-06-05 DIAGNOSIS — S0091XA Abrasion of unspecified part of head, initial encounter: Secondary | ICD-10-CM

## 2016-06-05 MED ORDER — ACETAMINOPHEN 325 MG PO TABS
650.0000 mg | ORAL_TABLET | Freq: Once | ORAL | Status: AC
  Administered 2016-06-05: 650 mg via ORAL
  Filled 2016-06-05: qty 2

## 2016-06-05 MED ORDER — TETANUS-DIPHTH-ACELL PERTUSSIS 5-2.5-18.5 LF-MCG/0.5 IM SUSP
0.5000 mL | Freq: Once | INTRAMUSCULAR | Status: AC
  Administered 2016-06-05: 0.5 mL via INTRAMUSCULAR
  Filled 2016-06-05: qty 0.5

## 2016-06-05 NOTE — ED Notes (Signed)
Bed: WA17 Expected date:  Expected time:  Means of arrival:  Comments: EMS Fall/,laceration

## 2016-06-05 NOTE — ED Provider Notes (Signed)
[place] DEPT Provider Note   CSN: [id number] Arrival date & time: 06/05/16  4327  LEVEL 5 CAVEAT - DEMENTIA   History   Chief Complaint Chief Complaint  Patient presents with  . Fall    brow lac    HPI CAVON NICOLLS is a 76 y.o. male.  HPI  76 year old male presents from his nursing home after a fall. The rounding tech found him on the floor in the bathroom. He was presumed a fall in the bathroom. Patient states to me that he fell but cannot remember how or if he passed out. The nurse that I spoke to at the facility states that he is not supposed to be out of bed and had a shuffling gait when they got him up today. He is on Xarelto.  Patient answers "yes" to all yes/no questions (such as about pain in locations). Was complaining of a headache to staff. They noticed an abrasion and head swelling over left temple. Staff states patient is at mental baseline and has not been ill recently.  Past Medical History:  Diagnosis Date  . AICD (automatic cardioverter/defibrillator) present   . Arthritis    "used to have a touch in my legs"  . Bradycardia 06/01/2014  . CAD (coronary artery disease)    LHC 9/05 with Dr. Einar Gip:  dLM 20-30%, LAD 85%, oD1 20-30%.  PCI:  Taxus DES to LAD; Dx jailed and tx with POBA.  Last myoview 12/10: inf scar, no ischemia, EF 29%.  . CHF (congestive heart failure) ([place])   . Crohn's disease ([place])   . Dementia   . Diabetes mellitus    11/05/11 "borderline; don't take medications"  . Dilated cardiomyopathy ([place])    2/12: EF 30-35%, trivial AI, mild RAE.  EF 2014 40 -45%  . DVT (deep venous thrombosis) ([place])   . HCAP (healthcare-associated pneumonia) 09/29/2015  . HLD (hyperlipidemia)   . 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 Hyperlipidemia   . Hypertension   . Memory difficulties   . Nephrolithiasis   . PFO (patent foramen ovale)    Not mentioned on 2014 echo.  . Pneumonia ~ 2011   09-22-13 denies any recent SOB or breathing problems  . Pulmonary embolus  (HCC)    chronic coumadin  . Stroke [facility]) 1993   "left arm can't hold steady; leg too"  . Swelling of both ankles     09-22-13 occ.feet, but denies pain.    Patient Active Problem List   Diagnosis Date Noted  . Seizures ([place]) 03/29/2016  . Confusion 03/26/2016  . Protein-calorie malnutrition, moderate ([place]) 09/30/2015  . 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 ICD (implantable cardioverter-defibrillator) in place 09/29/2015  . Vascular dementia without behavioral disturbance 09/25/2015  . Anemia 09/08/2015  . OA (osteoarthritis) 06/06/2015  . Bilateral lower extremity pain 05/14/2015  . BPH (benign prostatic hyperplasia) 04/10/2015  . FTT (failure to thrive) in adult 11/05/2014  . Protein-calorie malnutrition, severe ([place]) 10/30/2014  . 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 Chronic combined systolic and diastolic CHF (congestive heart failure) ([place])   . Pulmonary embolus ([place]) 07/16/2014  . Late effects of CVA (cerebrovascular accident) 07/16/2014  . Cerebral infarction ([place])   . Hyperlipidemia   . Coronary artery disease involving native coronary artery of native heart without angina pectoris   . CKD (chronic kidney disease) stage 3, GFR 30-59 ml/min 04/16/2014  . Anemia, chronic disease 11/21/2013  . DM2 (diabetes mellitus, type 2) ([place]) 11/20/2013  .  Crohn's disease ([place]) 02/16/2012  . DVT (deep venous thrombosis) ([place]) 06/05/2010  . COPD (chronic obstructive pulmonary disease) ([place]) 04/05/2010  . Hypertensive heart disease with CHF (congestive heart failure) ([place]) 02/10/2009  . TOBACCO ABUSE 12/12/2008  . Dilated cardiomyopathy ([place]) 12/12/2008    Past Surgical History:  Procedure Laterality Date  . APPENDECTOMY    . COLON SURGERY  1994; 1996   "for Crohn's disease"  . IMPLANTABLE CARDIOVERTER DEFIBRILLATOR IMPLANT N/A 06/02/2014   Procedure: IMPLANTABLE CARDIOVERTER DEFIBRILLATOR IMPLANT;  Surgeon: [name], MD;  Location: [facility] C[facility] LAB;  Service:  Cardiovascular;  Laterality: N/A;       Home Medications    Prior to Admission medications   Medication Sig Start Date End Date Taking? Authorizing Provider  acetaminophen (TYLENOL) 500 MG tablet Take 1,000 mg by mouth 2 (two) times daily.    Yes Historical Provider, MD  ALPRAZolam Duanne Moron) 0.5 MG tablet Take 0.5 mg by mouth daily as needed for anxiety.   Yes Historical Provider, MD  aspirin 81 MG tablet Take 81 mg by mouth daily.   Yes Historical Provider, MD  atorvastatin (LIPITOR) 10 MG tablet Take 1 tablet (10 mg total) by mouth daily at 6 PM. Patient taking differently: Take 10 mg by mouth at bedtime.  04/18/14  Yes Maryann Mikhail, DO  donepezil (ARICEPT) 10 MG tablet Take 10 mg by mouth at bedtime.  02/23/15  Yes Historical Provider, MD  feeding supplement, ENSURE ENLIVE, (ENSURE ENLIVE) LIQD Take 237 mLs by mouth 2 (two) times daily between meals. 10/04/15  Yes Hosie Poisson, MD  latanoprost (XALATAN) 0.005 % ophthalmic solution Place 1 drop into both eyes at bedtime. 06/29/13  Yes Historical Provider, MD  levalbuterol Penne Lash) 0.63 MG/3ML nebulizer solution Take 3 mLs (0.63 mg total) by nebulization every 6 (six) hours as needed for wheezing or shortness of breath. 09/11/15  Yes Ripudeep Krystal Eaton, MD  levETIRAcetam (KEPPRA) 500 MG tablet Take 1 tablet (500 mg total) by mouth 2 (two) times daily. 03/28/16  Yes Hosie Poisson, MD  rivaroxaban (XARELTO) 20 MG TABS tablet Take 1 tablet (20 mg total) by mouth daily with supper. Patient taking differently: Take 20 mg by mouth at bedtime.  06/23/14  Yes Hosie Poisson, MD  senna (SENOKOT) 8.6 MG TABS tablet Take 1 tablet by mouth every morning.   Yes Historical Provider, MD  sertraline (ZOLOFT) 50 MG tablet Take 50 mg by mouth every morning.  05/18/14  Yes Historical Provider, MD  tamsulosin (FLOMAX) 0.4 MG CAPS capsule Take 1 capsule (0.4 mg total) by mouth daily. 04/18/14  Yes Maryann Mikhail, DO  traMADol (ULTRAM) 50 MG tablet Take 50 mg by mouth 2 (two)  times daily.   Yes Historical Provider, MD    Family History Family History  Problem Relation Age of Onset  . Diabetes type II Mother   . CAD Father   . Diabetes type II Brother     Social History Social History  Substance Use Topics  . Smoking status: Current Some Day Smoker    Packs/day: 0.50    Years: 53.00    Types: Cigarettes  . Smokeless tobacco: Never Used  . Alcohol use No     Allergies   Tuberculin tests   Review of Systems Review of Systems  Unable to perform ROS: Dementia     Physical Exam Updated Vital Signs BP 143/80 (BP Location: Right Arm)   Pulse 63   Temp 98 F (36.7 C) (Oral)   Resp 18  SpO2 100%   Physical Exam  Constitutional: He appears well-developed and well-nourished.  HENT:  Head: Normocephalic. Head is with laceration.    Right Ear: External ear normal.  Left Ear: External ear normal.  Nose: Nose normal.  Eyes: Right eye exhibits no discharge. Left eye exhibits no discharge.  Neck: Neck supple.  Cardiovascular: Normal rate, regular rhythm and normal heart sounds.   Pulmonary/Chest: Effort normal and breath sounds normal.  Abdominal: Soft. There is no tenderness.  Musculoskeletal: He exhibits no edema.  Neurological: He is alert. He is disoriented.  Awake, knows location and name but confused on date/time/year. He has no facial droop. Equal strength in BUE. Gets angry when I ask him to move lower extremities. When I try he actively resists, unclear if in pain or just resisting. Strength appears equal.  Skin: Skin is warm and dry.  Nursing note and vitals reviewed.    ED Treatments / Results  Labs (all labs ordered are listed, but only abnormal results are displayed) Labs Reviewed - No data to display  EKG  EKG Interpretation  Date/Time:  Wednesday June 05 2016 10:30:11 EST Ventricular Rate:  63 PR Interval:    QRS Duration: 87 QT Interval:  420 QTC Calculation: 430 R Axis:   38 Text Interpretation:   Atrial-paced complexes Left ventricular hypertrophy Anterior Q waves, possibly due to LVH Diffuse ST elevation similar to prior ECGs Confirmed by Jolyne Laye MD, Lexia Vandevender [phone]) on 06/05/2016 10:40:06 AM       Radiology Ct Head Wo Contrast  Result Date: 06/05/2016 CLINICAL DATA:  76 year old male with history of trauma from a fall while going to the bathroom with injury to the head against a wall. Leading overlying the left eyebrow. No loss of consciousness. History of dementia. EXAM: CT HEAD WITHOUT CONTRAST CT CERVICAL SPINE WITHOUT CONTRAST TECHNIQUE: Multidetector CT imaging of the head and cervical spine was performed following the standard protocol without intravenous contrast. Multiplanar CT image reconstructions of the cervical spine were also generated. COMPARISON:  Head CT 05/11/2016.  Cervical spine CT 05/11/2016. FINDINGS: CT HEAD FINDINGS Brain: No evidence of acute infarction, hemorrhage, hydrocephalus, extra-axial collection or mass lesion/mass effect. Well-defined areas of low attenuation in the cerebellar hemispheres bilaterally (left greater than right), compatible with areas of encephalomalacia/gliosis from remote infarctions. Well-defined focus of low attenuation in the right thalamus, anterior aspect of the right basal ganglia and in the inferior aspect of the left basal ganglia, compatible with old lacunar infarcts. Mild cerebral atrophy. Patchy and confluent areas of decreased attenuation are noted throughout the deep and periventricular white matter of the cerebral hemispheres bilaterally, compatible with chronic microvascular ischemic disease. Vascular: Cerebrovascular atherosclerosis without acute abnormalities. Skull: Normal. Negative for fracture or focal lesion. Sinuses/Orbits: No acute finding. Other: None. CT CERVICAL SPINE FINDINGS Alignment: Normal. Skull base and vertebrae: No acute fracture. No primary bone lesion or focal pathologic process. Soft tissues and spinal canal: No  prevertebral fluid or swelling. No visible canal hematoma. Disc levels: Multilevel degenerative disc disease, most pronounced at C4-C5, C5-C6 and C6-C7. Mild multilevel facet arthropathy. Upper chest: Visualized portions of the upper thorax are unremarkable. Other: None. IMPRESSION: 1. No evidence of significant acute traumatic injury to the skull, brain or cervical spine. 2. Mild cerebral atrophy with extensive chronic microvascular ischemic changes, multiple lacunar infarcts in the basal ganglia and right thalamus, and old bilateral (left greater than right) cerebellar infarcts, as above. 3. Multilevel degenerative disc disease and cervical spondylosis, as above. Electronically Signed  By: Vinnie Langton M.D.   On: 06/05/2016 10:29   Ct Cervical Spine Wo Contrast  Result Date: 06/05/2016 CLINICAL DATA:  76 year old male with history of trauma from a fall while going to the bathroom with injury to the head against a wall. Leading overlying the left eyebrow. No loss of consciousness. History of dementia. EXAM: CT HEAD WITHOUT CONTRAST CT CERVICAL SPINE WITHOUT CONTRAST TECHNIQUE: Multidetector CT imaging of the head and cervical spine was performed following the standard protocol without intravenous contrast. Multiplanar CT image reconstructions of the cervical spine were also generated. COMPARISON:  Head CT 05/11/2016.  Cervical spine CT 05/11/2016. FINDINGS: CT HEAD FINDINGS Brain: No evidence of acute infarction, hemorrhage, hydrocephalus, extra-axial collection or mass lesion/mass effect. Well-defined areas of low attenuation in the cerebellar hemispheres bilaterally (left greater than right), compatible with areas of encephalomalacia/gliosis from remote infarctions. Well-defined focus of low attenuation in the right thalamus, anterior aspect of the right basal ganglia and in the inferior aspect of the left basal ganglia, compatible with old lacunar infarcts. Mild cerebral atrophy. Patchy and confluent  areas of decreased attenuation are noted throughout the deep and periventricular white matter of the cerebral hemispheres bilaterally, compatible with chronic microvascular ischemic disease. Vascular: Cerebrovascular atherosclerosis without acute abnormalities. Skull: Normal. Negative for fracture or focal lesion. Sinuses/Orbits: No acute finding. Other: None. CT CERVICAL SPINE FINDINGS Alignment: Normal. Skull base and vertebrae: No acute fracture. No primary bone lesion or focal pathologic process. Soft tissues and spinal canal: No prevertebral fluid or swelling. No visible canal hematoma. Disc levels: Multilevel degenerative disc disease, most pronounced at C4-C5, C5-C6 and C6-C7. Mild multilevel facet arthropathy. Upper chest: Visualized portions of the upper thorax are unremarkable. Other: None. IMPRESSION: 1. No evidence of significant acute traumatic injury to the skull, brain or cervical spine. 2. Mild cerebral atrophy with extensive chronic microvascular ischemic changes, multiple lacunar infarcts in the basal ganglia and right thalamus, and old bilateral (left greater than right) cerebellar infarcts, as above. 3. Multilevel degenerative disc disease and cervical spondylosis, as above. Electronically Signed   By: Vinnie Langton M.D.   On: 06/05/2016 10:29   Dg Hips Bilat W Or Wo Pelvis 3-4 Views  Result Date: 06/05/2016 CLINICAL DATA:  Fall. EXAM: DG HIP (WITH OR WITHOUT PELVIS) 3-4V BILAT COMPARISON:  No recent prior. FINDINGS: Degenerative changes lumbar spine and both hips. No evidence of fracture or dislocation. Aortoiliac atherosclerotic vascular calcification. Pelvic calcifications consistent with phleboliths. IMPRESSION: 1. Degenerative changes lumbar spine and both hips. No acute abnormality. 2.  Aortoiliac atherosclerotic vascular disease. Electronically Signed   By: Marcello Moores  Register   On: 06/05/2016 10:30    Procedures Procedures (including critical care time)  Medications Ordered in  ED Medications  acetaminophen (TYLENOL) tablet 650 mg (650 mg Oral Given 06/05/16 1023)  Tdap (BOOSTRIX) injection 0.5 mL (0.5 mLs Intramuscular Given 06/05/16 1023)     Initial Impression / Assessment and Plan / ED Course  I have reviewed the triage vital signs and the nursing notes.  Pertinent labs & imaging results that were available during my care of the patient were reviewed by me and considered in my medical decision making (see chart for details).  Clinical Course as of Jun 05 1145  Wed Jun 05, 2016  0955 Will get CT head/c-spine and hip xrays (I think he is resisting rather than in pain but given fall will check). Appears comfortable, otherwise resting without appearing in pain unless directly asked.  [SG]  7209 Given unwitnessed  fall will get ECG. Otherwise VSS, hasn't been ill or febrile, do not think labs necessary at this time  [SG]    Clinical Course User Index [SG] Sherwood Gambler, MD    Patient's only sign of injury is a small superficial abrasion to his left temple. CT is without obvious pathology or bleeding. He is able to ambulate without difficulty with a walker and is bearing weight. I have very low suspicion of occult fracture. Plan to discharge back to his facility.  Final Clinical Impressions(s) / ED Diagnoses   Final diagnoses:  Fall, initial encounter  Abrasion of head, initial encounter    New Prescriptions New Prescriptions   No medications on file     Sherwood Gambler, MD 06/05/16 1148

## 2016-06-05 NOTE — ED Notes (Signed)
[facility] been called

## 2016-06-05 NOTE — ED Notes (Signed)
Called [facility] facility for report, caregiver .

## 2016-06-05 NOTE — ED Triage Notes (Signed)
Pt from [facility], pt had fall when going to bathroom, hit head against wall. Presents with left brow. Bleeding controled. No blood thinners. No loc. Hx dementia. Alert and oriented x 3 normal to baseline.

## 2016-06-05 NOTE — ED Notes (Signed)
Pt. Ambulated with no assist. Pt walked with a walker from the bed to hall but had guidance by the techs. Nurse aware.

## 2016-06-19 ENCOUNTER — Other Ambulatory Visit: Payer: Self-pay | Admitting: *Deleted

## 2016-06-19 MED ORDER — ALPRAZOLAM 0.5 MG PO TABS: ORAL_TABLET | ORAL | 5 refills | Status: DC

## 2016-06-19 NOTE — Telephone Encounter (Signed)
[facility] [phone] EAD:[phone]

## 2016-07-03 ENCOUNTER — Encounter: Payer: Self-pay | Admitting: Adult Health

## 2016-07-03 ENCOUNTER — Non-Acute Institutional Stay (SKILLED_NURSING_FACILITY): Payer: Medicare Other | Admitting: Adult Health

## 2016-07-03 DIAGNOSIS — I2510 Atherosclerotic heart disease of native coronary artery without angina pectoris: Secondary | ICD-10-CM

## 2016-07-03 DIAGNOSIS — M79605 Pain in left leg: Secondary | ICD-10-CM

## 2016-07-03 DIAGNOSIS — I2782 Chronic pulmonary embolism: Secondary | ICD-10-CM

## 2016-07-03 DIAGNOSIS — I5042 Chronic combined systolic (congestive) and diastolic (congestive) heart failure: Secondary | ICD-10-CM

## 2016-07-03 DIAGNOSIS — R569 Unspecified convulsions: Secondary | ICD-10-CM

## 2016-07-03 DIAGNOSIS — E1122 Type 2 diabetes mellitus with diabetic chronic kidney disease: Secondary | ICD-10-CM

## 2016-07-03 DIAGNOSIS — N183 Chronic kidney disease, stage 3 unspecified: Secondary | ICD-10-CM

## 2016-07-03 DIAGNOSIS — J449 Chronic obstructive pulmonary disease, unspecified: Secondary | ICD-10-CM

## 2016-07-03 DIAGNOSIS — I110 Hypertensive heart disease with heart failure: Secondary | ICD-10-CM

## 2016-07-03 DIAGNOSIS — M79604 Pain in right leg: Secondary | ICD-10-CM

## 2016-07-03 DIAGNOSIS — I6340 Cerebral infarction due to embolism of unspecified cerebral artery: Secondary | ICD-10-CM

## 2016-07-03 DIAGNOSIS — D638 Anemia in other chronic diseases classified elsewhere: Secondary | ICD-10-CM

## 2016-07-03 NOTE — Progress Notes (Signed)
Location:   [facility] Room Number: 107 B Place of Service:  SNF (31)   CODE STATUS: Full Code  Allergies  Allergen Reactions  . Tuberculin Tests Other (See Comments)    Per MAR (no description provided)    Chief Complaint  Patient presents with  . Medical Management of Chronic Issues    1 month follow up    HPI:  He is a long term resident of this facility being seen for the management of his medical illnesses. Overall there is little change in his status.  He has had a fall this past was taken to the ED without further complications. There are no nursing concerns at this time.    Past Medical History:  Diagnosis Date  . AICD (automatic cardioverter/defibrillator) present   . Arthritis    "used to have a touch in my legs"  . Bradycardia 06/01/2014  . CAD (coronary artery disease)    LHC 9/05 with Dr. Einar Gip:  dLM 20-30%, LAD 85%, oD1 20-30%.  PCI:  Taxus DES to LAD; Dx jailed and tx with POBA.  Last myoview 12/10: inf scar, no ischemia, EF 29%.  . CHF (congestive heart failure) ([place])   . Crohn's disease ([place])   . Dementia   . Diabetes mellitus    11/05/11 "borderline; don't take medications"  . Dilated cardiomyopathy ([place])    2/12: EF 30-35%, trivial AI, mild RAE.  EF 2014 40 -45%  . DVT (deep venous thrombosis) ([place])   . HCAP (healthcare-associated pneumonia) 09/29/2015  . HLD (hyperlipidemia)   . 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 Hyperlipidemia   . Hypertension   . Memory difficulties   . Nephrolithiasis   . PFO (patent foramen ovale)    Not mentioned on 2014 echo.  . Pneumonia ~ 2011   09-22-13 denies any recent SOB or breathing problems  . Pulmonary embolus (HCC)    chronic coumadin  . Stroke [facility]) 1993   "left arm can't hold steady; leg too"  . Swelling of both ankles     09-22-13 occ.feet, but denies pain.    Past Surgical History:  Procedure Laterality Date  . APPENDECTOMY    . COLON SURGERY  1994; 1996   "for Crohn's disease"  . IMPLANTABLE  CARDIOVERTER DEFIBRILLATOR IMPLANT N/A 06/02/2014   Procedure: IMPLANTABLE CARDIOVERTER DEFIBRILLATOR IMPLANT;  Surgeon: [name], MD;  Location: [facility] CATH LAB;  Service: Cardiovascular;  Laterality: N/A;    Social History   Social History  . Marital status: Married    Spouse name: N/A  . Number of children: N/A  . Years of education: N/A   Occupational History  . Not on file.   Social History Main Topics  . Smoking status: Current Some Day Smoker    Packs/day: 0.50    Years: 53.00    Types: Cigarettes  . Smokeless tobacco: Never Used  . Alcohol use No  . Drug use: No  . Sexual activity: Not Currently   Other Topics Concern  . Not on file   Social History Narrative  . No narrative on file   Family History  Problem Relation Age of Onset  . Diabetes type II Mother   . CAD Father   . Diabetes type II Brother       VITAL SIGNS BP 122/65   Pulse 76   Temp 97.5 F (36.4 C)   Resp 16   Ht 5' 3"  (1.6 m)   Wt 131 lb 4.8 oz (59.6 kg)  SpO2 97%   BMI 23.26 kg/m   Patient's Medications  New Prescriptions   No medications on file  Previous Medications   ACETAMINOPHEN (TYLENOL) 500 MG TABLET    Take 1,000 mg by mouth 2 (two) times daily.    ALPRAZOLAM (XANAX) 0.5 MG TABLET    Take one tablet by mouth every 24 hours as needed for anxiety   ASPIRIN 81 MG TABLET    Take 81 mg by mouth daily.   ATORVASTATIN (LIPITOR) 10 MG TABLET    Take 10 mg by mouth at bedtime.   DONEPEZIL (ARICEPT) 10 MG TABLET    Take 10 mg by mouth at bedtime.    FEEDING SUPPLEMENT, ENSURE ENLIVE, (ENSURE ENLIVE) LIQD    Take 237 mLs by mouth 2 (two) times daily between meals.   LATANOPROST (XALATAN) 0.005 % OPHTHALMIC SOLUTION    Place 1 drop into both eyes at bedtime.   LEVALBUTEROL (XOPENEX) 0.63 MG/3ML NEBULIZER SOLUTION    Take 3 mLs (0.63 mg total) by nebulization every 6 (six) hours as needed for wheezing or shortness of breath.   LEVETIRACETAM (KEPPRA) 500 MG TABLET    Take 1 tablet  (500 mg total) by mouth 2 (two) times daily.   RIVAROXABAN (XARELTO) 20 MG TABS TABLET    Take 20 mg by mouth at bedtime.   SENNA (SENOKOT) 8.6 MG TABS TABLET    Take 1 tablet by mouth every morning.   SERTRALINE (ZOLOFT) 50 MG TABLET    Take 50 mg by mouth every morning.    TAMSULOSIN (FLOMAX) 0.4 MG CAPS CAPSULE    Take 1 capsule (0.4 mg total) by mouth daily.   TRAMADOL (ULTRAM) 50 MG TABLET    Take 50 mg by mouth 2 (two) times daily.  Modified Medications   No medications on file  Discontinued Medications   ATORVASTATIN (LIPITOR) 10 MG TABLET    Take 1 tablet (10 mg total) by mouth daily at 6 PM.   RIVAROXABAN (XARELTO) 20 MG TABS TABLET    Take 1 tablet (20 mg total) by mouth daily with supper.     SIGNIFICANT DIAGNOSTIC EXAMS  09-08-15: ct of head and cervical spine: 1. No evidence of acute intracranial or cervical spine injury. 2. Extensive chronic ischemic injury as described, stable from 12/21/2014 comparison.  09-09-15: EEG: Clinical Interpretation: This essentially normal EEG is recorded in the waking and sleep state. There was no seizure or seizure predisposition recorded on this study. Please note that a normal EEG does not preclude the possibility of epilepsy.  09-29-15 ct of head: No acute abnormality. Extensive atrophy and multiple remote infarctions as seen on prior exams  09-30-15: swallow study: Regular solids;Thin liquid   10-05-15: chest x-ray: Retrocardiac density may represent atelectasis versus infiltrate.  10-06-15: right upper quad ultrasound: 1.  Small gallstone.  No biliary distention. 2. Liver is slightly echogenic suggesting fatty infiltration .  10-06-15: 2-d echo:   - Left ventricle: The cavity size was normal. Systolic function was moderately to severely reduced. The estimated ejection fraction was in the range of 30% to 35%. Diffuse hypokinesis. Doppler parameters are consistent with abnormal left ventricular relaxation (grade 1 diastolic dysfunction). There  was no evidence of elevated ventricular filling pressure by Doppler parameters. - Aortic valve: There was mild regurgitation. - Aortic root: The aortic root was normal in size. - Mitral valve: Structurally normal valve. There was mild regurgitation. - Left atrium: The atrium was mildly dilated. - Right ventricle: Pacer wire or  catheter noted in right ventricle. Systolic function was normal. - Right atrium: The atrium was moderately dilated. Pacer wire or catheter noted in right atrium. - Tricuspid valve: There was mild regurgitation. - Pulmonary arteries: Systolic pressure was mildly increased. PA peak pressure: 39 mm Hg (S). - Inferior vena cava: The vessel was dilated. The respirophasic diameter changes were blunted (< 50%), consistent with elevated central venous pressure. - Pericardium, extracardiac: There was no pericardial effusion. Impressions: - When compared to the prior study from 05/31/14 the LVEF has improved, now 30-35% with diffuse hypokinesis.  10-11-15: chest x-ray: right lower lobe atelectasis; no active TB.   11-16-15: left hip and pelvis: no acute osseous abnormalities.   03-26-16: ct of head: 1. Old cerebral and cerebellar infarcts without acute findings. 2. Periventricular white matter and corona radiata hypodensities favor chronic ischemic microvascular white matter disease.   03-27-16: EEG: This awake and drowsy EEG is abnormal due to the presence of: 1. Mild diffuse slowing of the waking background 2. Focal slowing over the left temporal region 3. Rare sharp waves over the left temporal region Clinical Correlation of the above findings indicates diffuse cerebral dysfunction that is non-specific in etiology and can be seen with hypoxic/ischemic injury, toxic/metabolic encephalopathies, neurodegenerative disorders, or medication effect. Focal slowing over the left temporal region indicates focal cerebral dysfunction in this region suggestive of underlying structural or  physiologic abnormality. There is a possible tendency for seizures to arise from this region. There were no electrographic seizures in this study. Clinical correlation is advised.   06-05-16: bilateral hip x-ray: 1. Degenerative changes lumbar spine and both hips. No acute abnormality. 2.  Aortoiliac atherosclerotic vascular disease.   06-05-16: ct of head and cervical spine: 1. No evidence of significant acute traumatic injury to the skull, brain or cervical spine. 2. Mild cerebral atrophy with extensive chronic microvascular ischemic changes, multiple lacunar infarcts in the basal ganglia and right thalamus, and old bilateral (left greater than right) cerebellar infarcts, as above. 3. Multilevel degenerative disc disease and cervical spondylosis, as above.    LABS REVIEWED:   09-08-15: wbc 7.2; hgb 7.3; hct 26.5; mcv 69.6; plt clump; glucose 218; bun 15; creat 1.31; k+ 4.0; na++ 137; liver normal albumin 3.0; mag 1.7; hgb a1c 6.8; tsh 1.135 09-10-15: wbc 9.8; hgb 9.0; hxct 31.2. mcv 71.6; plt 179; glucose 75; bun 14; creat 1.16; k+ 4.1; na++ 139 09-11-15: wbc 10.4; hg 8.7; hct 30.1; mcv 72.5; plt 208  09-15-15: wbc 9.2; hgb 9.8; hct 35.6; mcv 72.0; plt 251; glucose 133; bun 18.1; creat 1.04; k+ 4.6; na++ 140; liver normal albumin 3.0; chol 135; ldl 37; tri 82; hdl 81; hgb a1c 5.5 PSA 3.60  10-08-15: wbc 13.7; hgb 9.9; hct 33.3; mcv 74.0; plt 375; glucose 113; bun 23; creat 1.20; k+ 4.6; na++ 136  03-26-16: wbc 11.4 hgb 10.0; hct 34.5; mcv 71.7; plt 216; glucose 136; bun 16; creat 1.45; k+ 4.2; na++ 138; liver normal albumin 3.2 03-28-16: glucose 114; bun 20; creat 1.45; k+ 4.2; na++ 138  04-01-16: wbc 6.3; hgb 9.9; hct 35.4; mcv 75.0; plt 210; glucose 86; bun 17.8; creat 1.15; k+ 4.7; na++ 140; liver normal albumin 3.8; chol 157; ldl 67; trig 119; hdl 66 04-04-16: wbc 10.1; hgb 10.5; hct 37.3; mcv 75.4; plt 229; glucose 199; bun 19.6; creat 1.28; k+ 4.2; na++ 140  04-15-16: hgb a1c 6.9  05-11-16: wbc  9.4; hgb 9.8; hct 33.6; mcv 71.9; plt 198; glucose 190; bun 18; creat 1.44;  k+ 4.5; na++ 135       Review of Systems  Unable to perform ROS: dementia    Physical Exam  Constitutional: No distress.  Frail   Eyes: Conjunctivae are normal.  Neck: Neck supple. No JVD present. No thyromegaly present.  Cardiovascular: Normal rate, regular rhythm and intact distal pulses.   Respiratory: Effort normal and breath sounds normal. No respiratory distress. He has no wheezes.  GI: Soft. Bowel sounds are normal. He exhibits no distension. There is no tenderness.  Musculoskeletal: He exhibits no edema.  Left hemiparesis  Lymphadenopathy:    He has no cervical adenopathy.  Neurological: He is alert.  Skin: Skin is warm and dry. He is not diaphoretic.  Psychiatric: He has a normal mood and affect.    ASSESSMENT/ PLAN:  1.  Bilateral lower extremity pain:  Continue  tylenol  1 gm twice daily and ultram 50 mg twice daily  2. Dyslipidemia: will continue lipitor 10 mg daily ldl is 67   3. Dementia: without significant change; his current weight is 131 pounds is presently stable; will continue aricept 10 mg nightly   4. BPH: will continue flomax 0.4 mg daily   5. Chronic diastoic heart failure: EF 30-35% (10-06-15) is status post ICD placement (interrogated; now pace maker function only)   is currently not on medications; will not make changes will monitor  6. PE: is stable will continue xarelto 20 mg daily   7. CVA: is neurologically stable will continue xarelto 20 mg daily   8. Depression: will continue zoloft 50 mg daily and takes xanax 0.5 mg daily as needed   9. CAD: no indications of chest pain present; will continue asa 81 mg daily  10. Glaucoma: will continue xalatan to both eyes.   11. Constipation: will continue senna daily   12. COPD: has xopenex neb every 6 hours as needed  will not make changes will monitor his status.   13. CKD stage III: bun/creat 19.6/1.28   14.  Hypotension: is currently not on medications; will not make changes will monitor   15. Seizures: no further seizure activities; will continue keppra 500 mg twice daily and will monitor  16. Diabetes: currently not on medications; unable to tolerate ace due to low blood pressure    Will get urine for micro-albumin    MD is aware of resident's narcotic use and is in agreement with current plan of care. We will attempt to wean resident as apropriate   Ok Edwards NP [facility]  Contact [phone] Monday through Friday 8am- 5pm  After hours call [phone]

## 2016-07-05 ENCOUNTER — Encounter: Payer: Self-pay | Admitting: Adult Health

## 2016-07-05 NOTE — Progress Notes (Signed)
ENTERED IN ERROR

## 2016-07-15 LAB — HEPATIC FUNCTION PANEL
ALK PHOS: 94 U/L (ref 25–125)
ALT: 14 U/L (ref 10–40)
AST: 16 U/L (ref 14–40)
Bilirubin, Total: 0.5 mg/dL

## 2016-07-15 LAB — BASIC METABOLIC PANEL
BUN: 21 mg/dL (ref 4–21)
CREATININE: 1.3 mg/dL (ref 0.6–1.3)
Glucose: 236 mg/dL
POTASSIUM: 4.5 mmol/L (ref 3.4–5.3)
Sodium: 144 mmol/L (ref 137–147)

## 2016-07-15 LAB — CBC AND DIFFERENTIAL
HCT: 33 % — AB (ref 41–53)
Hemoglobin: 9.5 g/dL — AB (ref 13.5–17.5)
NEUTROS ABS: 4 /uL
Platelets: 219 10*3/uL (ref 150–399)
WBC: 6.9 10^3/mL

## 2016-07-30 ENCOUNTER — Encounter: Payer: Self-pay | Admitting: Internal Medicine

## 2016-07-30 ENCOUNTER — Non-Acute Institutional Stay (SKILLED_NURSING_FACILITY): Payer: Medicare Other | Admitting: Internal Medicine

## 2016-07-30 DIAGNOSIS — F0150 Vascular dementia without behavioral disturbance: Secondary | ICD-10-CM | POA: Diagnosis not present

## 2016-07-30 DIAGNOSIS — I2510 Atherosclerotic heart disease of native coronary artery without angina pectoris: Secondary | ICD-10-CM

## 2016-07-30 DIAGNOSIS — I5042 Chronic combined systolic (congestive) and diastolic (congestive) heart failure: Secondary | ICD-10-CM | POA: Diagnosis not present

## 2016-07-30 DIAGNOSIS — I825Z9 Chronic embolism and thrombosis of unspecified deep veins of unspecified distal lower extremity: Secondary | ICD-10-CM | POA: Diagnosis not present

## 2016-07-30 DIAGNOSIS — I6990 Unspecified sequelae of unspecified cerebrovascular disease: Secondary | ICD-10-CM

## 2016-07-30 DIAGNOSIS — R569 Unspecified convulsions: Secondary | ICD-10-CM

## 2016-07-30 NOTE — Progress Notes (Signed)
Location:   [facility] Room Number: 107/B Place of Service:  SNF (31) Provider:  Georges Mouse, MD  Patient Care Team: Maury Dus, MD as PCP - [facility])  Extended Emergency Contact Information Primary Emergency Contact: Level,[name] Address: [address]          [place], [place] [address] [name] of [place] Phone: [phone] Mobile Phone: [phone] Relation: Spouse Secondary Emergency Contact: [name],[name]          [place], [place] Phone: [phone] Relation: Daughter  Code Status:  MOST Goals of care: Advanced Directive information Advanced Directives 07/30/2016  Does Patient Have a Medical Advance Directive? Yes  Type of Advance Directive Out of facility DNR (pink MOST or yellow form)  Does patient want to make changes to medical advance directive? No - Patient declined  Copy of [place] in Chart? -  Would patient like information on creating a medical advance directive? No - Patient declined  Pre-existing out of facility DNR order (yellow form or pink MOST form) -     Chief Complaint  Patient presents with  . Medical Management of Chronic Issues    Routine Visit   For medical management of chronic medical issues including bilateral leg leg pain-dementia-chronic diastolic CHF-history of pulmonary embolism and CVA-coronary artery disease-depression-COPD-chronic kidney disease stage III-seizures.   HPI:  Pt is a 76 y.o. male seen today for medical management of chronic diseases.  Nursing does not report any issues at this time.  He does have significant dementia his weight is stable at 131 pounds.  This pain at this point appears to be controlled on tramadol 50 mg twice a day as well as Tylenol thousand milligrams twice a day.  He does have significant dementia which appears to be progressing he is on Aricept again clinically appears  stable his weight is stable.  He does have a history chronic diastolic CHF with an ejection fraction between 30 and 35% on echo done in June 2017 he also has ICD placement now only pacemaker function only-not on any current medications but he does not appear symptomatic weights been stable edema appears to be stable respiratory status has been stable.  He has a history of pulmonary embolism continues on Xarelto 20 mg a day he's also on this for a history of CVA.  Coronary artery disease appears to be stable he is on aspirin as well as a statin.  He also on Keppra for history seizure disorder there been no recent seizures to my knowledge.  Currently he is resting in bed comfortably is not quite as interactive as I've seen him before I suspect his dementia is slowly progressing.          Past Medical History:  Diagnosis Date  . AICD (automatic cardioverter/defibrillator) present   . Arthritis    "used to have a touch in my legs"  . Bradycardia 06/01/2014  . CAD (coronary artery disease)    LHC 9/05 with Dr. Einar Gip:  dLM 20-30%, LAD 85%, oD1 20-30%.  PCI:  Taxus DES to LAD; Dx jailed and tx with POBA.  Last myoview 12/10: inf scar, no ischemia, EF 29%.  . CHF (congestive heart failure) ([place])   . Crohn's disease ([place])   . Dementia   . Diabetes mellitus    11/05/11 "borderline; don't take medications"  . Dilated cardiomyopathy ([place])    2/12: EF 30-35%, trivial AI, mild RAE.  EF 2014  40 -45%  . DVT (deep venous thrombosis) ([place])   . HCAP (healthcare-associated pneumonia) 09/29/2015  . HLD (hyperlipidemia)   . 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 Hyperlipidemia   . Hypertension   . Memory difficulties   . Nephrolithiasis   . PFO (patent foramen ovale)    Not mentioned on 2014 echo.  . Pneumonia ~ 2011   09-22-13 denies any recent SOB or breathing problems  . Pulmonary embolus (HCC)    chronic coumadin  . Stroke [facility]) 1993   "left arm can't hold steady; leg too"  . Swelling of both  ankles     09-22-13 occ.feet, but denies pain.   Past Surgical History:  Procedure Laterality Date  . APPENDECTOMY    . COLON SURGERY  1994; 1996   "for Crohn's disease"  . IMPLANTABLE CARDIOVERTER DEFIBRILLATOR IMPLANT N/A 06/02/2014   Procedure: IMPLANTABLE CARDIOVERTER DEFIBRILLATOR IMPLANT;  Surgeon: [name], MD;  Location: [facility] CATH LAB;  Service: Cardiovascular;  Laterality: N/A;    Allergies  Allergen Reactions  . Tuberculin Tests Other (See Comments)    Per MAR (no description provided)    Allergies as of 07/30/2016      Reactions   Tuberculin Tests Other (See Comments)   Per MAR (no description provided)      Medication List       Accurate as of 07/30/16  2:57 PM. Always use your most recent med list.          acetaminophen 500 MG tablet Commonly known as:  TYLENOL Take 1,000 mg by mouth 2 (two) times daily.   aspirin 81 MG tablet Take 81 mg by mouth daily.   atorvastatin 10 MG tablet Commonly known as:  LIPITOR Take 10 mg by mouth at bedtime.   donepezil 10 MG tablet Commonly known as:  ARICEPT Take 10 mg by mouth at bedtime.   feeding supplement (ENSURE ENLIVE) Liqd Take 237 mLs by mouth 2 (two) times daily between meals.   latanoprost 0.005 % ophthalmic solution Commonly known as:  XALATAN Place 1 drop into both eyes at bedtime.   levalbuterol 0.63 MG/3ML nebulizer solution Commonly known as:  XOPENEX Take 3 mLs (0.63 mg total) by nebulization every 6 (six) hours as needed for wheezing or shortness of breath.   levETIRAcetam 500 MG tablet Commonly known as:  KEPPRA Take 1 tablet (500 mg total) by mouth 2 (two) times daily.   rivaroxaban 20 MG Tabs tablet Commonly known as:  XARELTO Take 20 mg by mouth at bedtime.   senna 8.6 MG Tabs tablet Commonly known as:  SENOKOT Take 1 tablet by mouth every morning.   sertraline 50 MG tablet Commonly known as:  ZOLOFT Take 50 mg by mouth every morning.   tamsulosin 0.4 MG Caps  capsule Commonly known as:  FLOMAX Take 1 capsule (0.4 mg total) by mouth daily.   traMADol 50 MG tablet Commonly known as:  ULTRAM Take 50 mg by mouth 2 (two) times daily.       Review of Systems   Unable to obtain secondary to dementia please see history of present illness  Immunization History  Administered Date(s) Administered  . Influenza Whole 02/09/2010  . Influenza-Unspecified 01/21/2016  . PPD Test 11/05/2011, 10/09/2015  . Pneumococcal Polysaccharide-23 02/19/2010  . Tdap 06/05/2016   Pertinent  Health Maintenance Due  Topic Date Due  . URINE MICROALBUMIN  06/04/2017 (Originally 10/16/1950)  . PNA vac Low Risk Adult (2 of 2 - PCV13) 04/16/2023 (Originally 02/20/2011)  .  HEMOGLOBIN A1C  10/13/2016  . INFLUENZA VACCINE  11/13/2016  . FOOT EXAM  01/18/2017  . OPHTHALMOLOGY EXAM  03/25/2017   No flowsheet data found. Functional Status Survey:    Vitals:   07/30/16 1447  BP: 118/62  Pulse: 83  Resp: 17  Temp: 97.6 F (36.4 C)  TempSrc: Oral  SpO2: 99%  Weight: 131 lb 3.2 oz (59.5 kg)  Height: 5' 3"  (1.6 m)   Body mass index is 23.24 kg/m. Physical Exam  In general this is a frail elderly male in no distress lying comfortably in bed.  His skin is warm and dry.  Eyes he has prescription lenses sclera and conjunctiva are clear.  Oropharynx is clear mucous membranes moist.  Heart is regular rate and rhythm without murmur gallop or rub he does not have significant lower extremity edema.  GI abdomen soft nontender with positive bowel sounds.  Muscle skeletal skeletal is able to move his extremities on the right however has significant left-sided weakness compared to the right with a history of CVA.  Neurologic as noted above does have left-sided hemiparalysis.  He is alert does speak but not as much as he used to when I see him on previous visits.  Psych again does have dementia which appears to be progressing he is pleasant and cooperative.  He does  follow simple verbal commands     Labs reviewed:  Recent Labs  09/08/15 1902  03/26/16 2008 03/28/16 1355  04/04/16 05/11/16 1739 07/15/16  NA  --   < > 138 138  < > 140 135 144  K  --   < > 4.2 4.2  < > 4.2 4.5 4.5  CL  --   < > 107 105  --   --  104  --   CO2  --   < > 23 26  --   --  24  --   GLUCOSE  --   < > 136* 114*  --   --  190*  --   BUN  --   < > 16 20  < > 20 18 21   CREATININE  --   < > 1.45* 1.43*  < > 1.3 1.44* 1.3  CALCIUM  --   < > 9.3 9.4  --   --  9.4  --   MG 1.7  --   --   --   --   --   --   --   < > = values in this interval not displayed.  Recent Labs  10/05/15 2257 10/06/15 0519 03/26/16 2008 04/01/16 07/15/16  AST 155* 122* 25 19 16   ALT 263* 221* 30 21 14   ALKPHOS 170* 152* 91 103 94  BILITOT 0.3 0.2* 0.7  --   --   PROT 7.0 6.1* 7.1  --   --   ALBUMIN 2.3* 2.1* 3.2*  --   --     Recent Labs  10/08/15 0446 03/26/16 2008 04/01/16 04/04/16 05/11/16 1739 07/15/16  WBC 13.7* 11.4* 6.3 10.1 9.4 6.9  NEUTROABS  --  9.1* 3  --   --  4  HGB 9.9* 10.0* 9.9* 10.5* 9.8* 9.5*  HCT 33.3* 34.5* 35* 37* 33.6* 33*  MCV 74.0* 71.7*  --   --  71.9*  --   PLT 375 216 210 229 198 219   Lab Results  Component Value Date   TSH 1.64 09/15/2015   Lab Results  Component Value Date   HGBA1C  6.6 04/01/2016   Lab Results  Component Value Date   CHOL 157 04/01/2016   HDL 66 04/01/2016   LDLCALC 67 04/01/2016   TRIG 119 04/01/2016   CHOLHDL 3.1 04/16/2014    Significant Diagnostic Results in last 30 days:  No results found.  Assessment/Plan    1.  Bilateral lower extremity pain:  Continue  tylenol  1 gm twice daily and ultram 50 mg twice daily-at this point appears effective  2. Dyslipidemia: will continue lipitor 10 mg daily ldl is 67 on lab done December 2017   3. Dementia: without significant change; his current weight is 131 pounds is presently stable; will continue aricept 10 mg nightly --he appears to be having some progression of the  dementia  4. BPH: will continue flomax 0.4 mg daily   5. Chronic diastoic heart failure: EF 30-35% (10-06-15) is status post ICD placement (interrogated; now pace maker function only)   is currently not on medications; will not make changes will monitor-does not show evidence of increased edema shortness of breath or overt symptoms  6. PE: is stable will continue xarelto 20 mg daily   7. CVA: is neurologically stable will continue xarelto 20 mg daily   8. Depression: will continue zoloft 50 mg daily and takes xanax 0.5 mg daily as needed   9. CAD: no indications of chest pain present; will continue asa 81 mg daily  10. Glaucoma: will continue xalatan to both eyes.   11. Constipation: will continue senna daily   12. COPD: has xopenex neb every 6 hours as needed  will not make changes will monitor his status. Has not had any acute issues recently to my knowledge   30. CKD stage III: bun/creat 21/1.3 on lab done 07/15/2016 which is relatively baseline  14. Hypotension: is currently not on medications; will not make changes will monitor   15. Seizures: no further seizure activities; will continue keppra 500 mg twice daily and will monitor  16. Diabetes: currently not on medications; unable to tolerate ace due to low blood pressure - most-recent CBG was 112  #17 anemia suspect an element of chronic disease most recently 9.5 on lab done earlier this month which is relatively baseline Will monitor at periodic intervals    MD is aware of resident's narcotic use and is in agreement with current plan of care. We will attempt to wean resident as apropriate   [phone]

## 2016-07-31 LAB — HEMOGLOBIN A1C: HEMOGLOBIN A1C: 6.4

## 2016-07-31 LAB — CBC AND DIFFERENTIAL
HCT: 37 % — AB (ref 41–53)
Hemoglobin: 10.4 g/dL — AB (ref 13.5–17.5)
NEUTROS ABS: 2 /uL
Platelets: 236 10*3/uL (ref 150–399)
WBC: 5.6 10^3/mL

## 2016-07-31 LAB — BASIC METABOLIC PANEL
BUN: 22 mg/dL — AB (ref 4–21)
CREATININE: 1.2 mg/dL (ref 0.6–1.3)
Glucose: 74 mg/dL
Potassium: 4.9 mmol/L (ref 3.4–5.3)
SODIUM: 143 mmol/L (ref 137–147)

## 2016-07-31 LAB — HEPATIC FUNCTION PANEL
ALT: 11 U/L (ref 10–40)
AST: 14 U/L (ref 14–40)
Alkaline Phosphatase: 100 U/L (ref 25–125)
Bilirubin, Total: 0.6 mg/dL

## 2016-08-23 ENCOUNTER — Non-Acute Institutional Stay (SKILLED_NURSING_FACILITY): Payer: Medicare Other | Admitting: Adult Health

## 2016-08-23 ENCOUNTER — Encounter: Payer: Self-pay | Admitting: Adult Health

## 2016-08-23 DIAGNOSIS — J449 Chronic obstructive pulmonary disease, unspecified: Secondary | ICD-10-CM | POA: Diagnosis not present

## 2016-08-23 DIAGNOSIS — J3089 Other allergic rhinitis: Secondary | ICD-10-CM

## 2016-08-23 NOTE — Progress Notes (Signed)
Location:   [facility] Room Number: 107 B Place of Service:  SNF (31)   CODE STATUS: Full Code  Allergies  Allergen Reactions  . Tuberculin Tests Other (See Comments)    Per MAR (no description provided)    Chief Complaint  Patient presents with  . Acute Visit    Sore throat    HPI:  He is complaining of a sore throat and sinus congestion. He denies any cough or shortness of breath. He denies any headache. There are no reports of fever present.    Past Medical History:  Diagnosis Date  . AICD (automatic cardioverter/defibrillator) present   . Arthritis    "used to have a touch in my legs"  . Bradycardia 06/01/2014  . CAD (coronary artery disease)    LHC 9/05 with Dr. Einar Gip:  dLM 20-30%, LAD 85%, oD1 20-30%.  PCI:  Taxus DES to LAD; Dx jailed and tx with POBA.  Last myoview 12/10: inf scar, no ischemia, EF 29%.  . CHF (congestive heart failure) ([place])   . Crohn's disease ([place])   . Dementia   . Diabetes mellitus    11/05/11 "borderline; don't take medications"  . Dilated cardiomyopathy ([place])    2/12: EF 30-35%, trivial AI, mild RAE.  EF 2014 40 -45%  . DVT (deep venous thrombosis) ([place])   . HCAP (healthcare-associated pneumonia) 09/29/2015  . HLD (hyperlipidemia)   . 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 Hyperlipidemia   . Hypertension   . Memory difficulties   . Nephrolithiasis   . PFO (patent foramen ovale)    Not mentioned on 2014 echo.  . Pneumonia ~ 2011   09-22-13 denies any recent SOB or breathing problems  . Pulmonary embolus ([facility])    chronic coumadin  . Stroke [facility]) 1993   "left arm can't hold steady; leg too"  . Swelling of both ankles     09-22-13 occ.feet, but denies pain.    Past Surgical History:  Procedure Laterality Date  . APPENDECTOMY    . COLON SURGERY  1994; 1996   "for Crohn's disease"  . IMPLANTABLE CARDIOVERTER DEFIBRILLATOR IMPLANT N/A 06/02/2014   Procedure: IMPLANTABLE CARDIOVERTER DEFIBRILLATOR IMPLANT;  Surgeon: [name],  MD;  Location: [facility] CATH LAB;  Service: Cardiovascular;  Laterality: N/A;    Social History   Social History  . Marital status: Married    Spouse name: N/A  . Number of children: N/A  . Years of education: N/A   Occupational History  . Not on file.   Social History Main Topics  . Smoking status: Current Some Day Smoker    Packs/day: 0.50    Years: 53.00    Types: Cigarettes  . Smokeless tobacco: Never Used  . Alcohol use No  . Drug use: No  . Sexual activity: Not Currently   Other Topics Concern  . Not on file   Social History Narrative  . No narrative on file   Family History  Problem Relation Age of Onset  . Diabetes type II Mother   . CAD Father   . Diabetes type II Brother       VITAL SIGNS BP (!) 152/82   Pulse 91   Temp 97.8 F (36.6 C)   Resp 20   Ht 5' 3"  (1.6 m)   Wt 131 lb 3 oz (59.5 kg)   SpO2 98%   BMI 23.24 kg/m   Patient's Medications  New Prescriptions   No medications on file  Previous Medications  ACETAMINOPHEN (TYLENOL) 500 MG TABLET    Take 1,000 mg by mouth 2 (two) times daily.    ASPIRIN 81 MG TABLET    Take 81 mg by mouth daily.   ATORVASTATIN (LIPITOR) 10 MG TABLET    Take 10 mg by mouth at bedtime.   DONEPEZIL (ARICEPT) 10 MG TABLET    Take 10 mg by mouth at bedtime.    FLUTICASONE (FLONASE) 50 MCG/ACT NASAL SPRAY    Place 2 sprays into both nostrils at bedtime.   LATANOPROST (XALATAN) 0.005 % OPHTHALMIC SOLUTION    Place 1 drop into both eyes at bedtime.   LEVALBUTEROL (XOPENEX) 0.63 MG/3ML NEBULIZER SOLUTION    Take 3 mLs (0.63 mg total) by nebulization every 6 (six) hours as needed for wheezing or shortness of breath.   LEVETIRACETAM (KEPPRA) 500 MG TABLET    Take 1 tablet (500 mg total) by mouth 2 (two) times daily.   NUTRITIONAL SUPPLEMENTS (NUTRITIONAL SUPPLEMENT PO)    Give 237 ml by mouth two times daily for Health Supplement   RIVAROXABAN (XARELTO) 20 MG TABS TABLET    Take 20 mg by mouth at bedtime.   SENNA (SENOKOT)  8.6 MG TABS TABLET    Take 1 tablet by mouth every morning.   SERTRALINE (ZOLOFT) 50 MG TABLET    Take 50 mg by mouth every morning.    TAMSULOSIN (FLOMAX) 0.4 MG CAPS CAPSULE    Take 0.4 mg by mouth at bedtime.   TRAMADOL (ULTRAM) 50 MG TABLET    Take 50 mg by mouth 2 (two) times daily.  Modified Medications   No medications on file  Discontinued Medications   FEEDING SUPPLEMENT, ENSURE ENLIVE, (ENSURE ENLIVE) LIQD    Take 237 mLs by mouth 2 (two) times daily between meals.   TAMSULOSIN (FLOMAX) 0.4 MG CAPS CAPSULE    Take 1 capsule (0.4 mg total) by mouth daily.     SIGNIFICANT DIAGNOSTIC EXAMS  09-08-15: ct of head and cervical spine: 1. No evidence of acute intracranial or cervical spine injury. 2. Extensive chronic ischemic injury as described, stable from 12/21/2014 comparison.  09-09-15: EEG: Clinical Interpretation: This essentially normal EEG is recorded in the waking and sleep state. There was no seizure or seizure predisposition recorded on this study. Please note that a normal EEG does not preclude the possibility of epilepsy.  09-29-15 ct of head: No acute abnormality. Extensive atrophy and multiple remote infarctions as seen on prior exams  09-30-15: swallow study: Regular solids;Thin liquid   10-05-15: chest x-ray: Retrocardiac density may represent atelectasis versus infiltrate.  10-06-15: right upper quad ultrasound: 1.  Small gallstone.  No biliary distention. 2. Liver is slightly echogenic suggesting fatty infiltration .  10-06-15: 2-d echo:   - Left ventricle: The cavity size was normal. Systolic function was moderately to severely reduced. The estimated ejection fraction was in the range of 30% to 35%. Diffuse hypokinesis. Doppler parameters are consistent with abnormal left ventricular relaxation (grade 1 diastolic dysfunction). There was no evidence of elevated ventricular filling pressure by Doppler parameters. - Aortic valve: There was mild regurgitation. - Aortic  root: The aortic root was normal in size. - Mitral valve: Structurally normal valve. There was mild regurgitation. - Left atrium: The atrium was mildly dilated. - Right ventricle: Pacer wire or catheter noted in right ventricle. Systolic function was normal. - Right atrium: The atrium was moderately dilated. Pacer wire or catheter noted in right atrium. - Tricuspid valve: There was mild regurgitation. -  Pulmonary arteries: Systolic pressure was mildly increased. PA peak pressure: 39 mm Hg (S). - Inferior vena cava: The vessel was dilated. The respirophasic diameter changes were blunted (< 50%), consistent with elevated central venous pressure. - Pericardium, extracardiac: There was no pericardial effusion. Impressions: - When compared to the prior study from 05/31/14 the LVEF has improved, now 30-35% with diffuse hypokinesis.  10-11-15: chest x-ray: right lower lobe atelectasis; no active TB.   11-16-15: left hip and pelvis: no acute osseous abnormalities.   03-26-16: ct of head: 1. Old cerebral and cerebellar infarcts without acute findings. 2. Periventricular white matter and corona radiata hypodensities favor chronic ischemic microvascular white matter disease.   03-27-16: EEG: This awake and drowsy EEG is abnormal due to the presence of: 1. Mild diffuse slowing of the waking background 2. Focal slowing over the left temporal region 3. Rare sharp waves over the left temporal region Clinical Correlation of the above findings indicates diffuse cerebral dysfunction that is non-specific in etiology and can be seen with hypoxic/ischemic injury, toxic/metabolic encephalopathies, neurodegenerative disorders, or medication effect. Focal slowing over the left temporal region indicates focal cerebral dysfunction in this region suggestive of underlying structural or physiologic abnormality. There is a possible tendency for seizures to arise from this region. There were no electrographic seizures in this  study. Clinical correlation is advised.   06-05-16: bilateral hip x-ray: 1. Degenerative changes lumbar spine and both hips. No acute abnormality. 2.  Aortoiliac atherosclerotic vascular disease.   06-05-16: ct of head and cervical spine: 1. No evidence of significant acute traumatic injury to the skull, brain or cervical spine. 2. Mild cerebral atrophy with extensive chronic microvascular ischemic changes, multiple lacunar infarcts in the basal ganglia and right thalamus, and old bilateral (left greater than right) cerebellar infarcts, as above. 3. Multilevel degenerative disc disease and cervical spondylosis, as above.    LABS REVIEWED:   09-08-15: wbc 7.2; hgb 7.3; hct 26.5; mcv 69.6; plt clump; glucose 218; bun 15; creat 1.31; k+ 4.0; na++ 137; liver normal albumin 3.0; mag 1.7; hgb a1c 6.8; tsh 1.135 09-10-15: wbc 9.8; hgb 9.0; hxct 31.2. mcv 71.6; plt 179; glucose 75; bun 14; creat 1.16; k+ 4.1; na++ 139 09-11-15: wbc 10.4; hg 8.7; hct 30.1; mcv 72.5; plt 208  09-15-15: wbc 9.2; hgb 9.8; hct 35.6; mcv 72.0; plt 251; glucose 133; bun 18.1; creat 1.04; k+ 4.6; na++ 140; liver normal albumin 3.0; chol 135; ldl 37; tri 82; hdl 81; hgb a1c 5.5 PSA 3.60  10-08-15: wbc 13.7; hgb 9.9; hct 33.3; mcv 74.0; plt 375; glucose 113; bun 23; creat 1.20; k+ 4.6; na++ 136  03-26-16: wbc 11.4 hgb 10.0; hct 34.5; mcv 71.7; plt 216; glucose 136; bun 16; creat 1.45; k+ 4.2; na++ 138; liver normal albumin 3.2 03-28-16: glucose 114; bun 20; creat 1.45; k+ 4.2; na++ 138  04-01-16: wbc 6.3; hgb 9.9; hct 35.4; mcv 75.0; plt 210; glucose 86; bun 17.8; creat 1.15; k+ 4.7; na++ 140; liver normal albumin 3.8; chol 157; ldl 67; trig 119; hdl 66 04-04-16: wbc 10.1; hgb 10.5; hct 37.3; mcv 75.4; plt 229; glucose 199; bun 19.6; creat 1.28; k+ 4.2; na++ 140  04-15-16: hgb a1c 6.9  05-11-16: wbc 9.4; hgb 9.8; hct 33.6; mcv 71.9; plt 198; glucose 190; bun 18; creat 1.44; k+ 4.5; na++ 135  07-31-16: wbc 5.6; hgb 10.4; hct 36.5; mcv  74.3; plt 236; glucose 74; bun 22.1; creat 1.19; k+ 4.9; na++ 143; liver normal albumin 3.8; hgb a1c 6.4  Review of Systems  Constitutional: Positive for fever.  HENT: Positive for congestion and sore throat.   Respiratory: Negative for cough and shortness of breath.   Cardiovascular: Negative for chest pain and leg swelling.  Gastrointestinal: Negative for abdominal pain and constipation.  Musculoskeletal: Negative for back pain.  Skin: Negative.   Neurological: Negative for dizziness.  Psychiatric/Behavioral: The patient is not nervous/anxious.      Physical Exam  Constitutional: No distress.  Frail Throat without signs of inflammation present Has mild tenderness to frontal sinuses    Eyes: Conjunctivae are normal.  Neck: Neck supple. No JVD present. No thyromegaly present.  Cardiovascular: Normal rate, regular rhythm and intact distal pulses.   Respiratory: Effort normal and breath sounds normal. No respiratory distress. He has no wheezes.  GI: Soft. Bowel sounds are normal. He exhibits no distension. There is no tenderness.  Musculoskeletal: He exhibits no edema.  Left hemiparesis  Lymphadenopathy:    He has no cervical adenopathy.  Neurological: He is alert.  Skin: Skin is warm and dry. He is not diaphoretic.  Psychiatric: He has a normal mood and affect.    ASSESSMENT/ PLAN:  1. COPD: has xopenex neb every 6 hours as needed  will not make changes will monitor his status.   2.  Allergic rhinitis: will begin flonase 2 puffs per nostril nightly and will monitor    MD is aware of resident's narcotic use and is in agreement with current plan of care. We will attempt to wean resident as apropriate    Ok Edwards NP [facility]  Contact [phone] Monday through Friday 8am- 5pm  After hours call [phone]

## 2016-08-30 ENCOUNTER — Non-Acute Institutional Stay (SKILLED_NURSING_FACILITY): Payer: Medicare Other | Admitting: Adult Health

## 2016-08-30 ENCOUNTER — Encounter: Payer: Self-pay | Admitting: Adult Health

## 2016-08-30 DIAGNOSIS — I5042 Chronic combined systolic (congestive) and diastolic (congestive) heart failure: Secondary | ICD-10-CM

## 2016-08-30 DIAGNOSIS — F0150 Vascular dementia without behavioral disturbance: Secondary | ICD-10-CM

## 2016-08-30 DIAGNOSIS — I110 Hypertensive heart disease with heart failure: Secondary | ICD-10-CM

## 2016-08-30 DIAGNOSIS — J449 Chronic obstructive pulmonary disease, unspecified: Secondary | ICD-10-CM

## 2016-08-30 DIAGNOSIS — I825Z9 Chronic embolism and thrombosis of unspecified deep veins of unspecified distal lower extremity: Secondary | ICD-10-CM

## 2016-08-30 DIAGNOSIS — I2510 Atherosclerotic heart disease of native coronary artery without angina pectoris: Secondary | ICD-10-CM | POA: Diagnosis not present

## 2016-08-30 DIAGNOSIS — E1122 Type 2 diabetes mellitus with diabetic chronic kidney disease: Secondary | ICD-10-CM

## 2016-08-30 DIAGNOSIS — N183 Chronic kidney disease, stage 3 (moderate): Secondary | ICD-10-CM

## 2016-08-30 DIAGNOSIS — I5032 Chronic diastolic (congestive) heart failure: Secondary | ICD-10-CM

## 2016-08-30 NOTE — Progress Notes (Signed)
Location:   [place] Room Number: 107 B Place of Service:  SNF (31)   CODE STATUS: Full Code  Allergies  Allergen Reactions  . Tuberculin Tests Other (See Comments)    Per MAR (no description provided)    Chief Complaint  Patient presents with  . Medical Management of Chronic Issues    1 month follow up    HPI:  He is a long term resident of this facility being seen for the management of his chronic illnesses. Overall there is little change in his status. He  does get out of bed daily. He tells me that he is feeling good. There are no nursing concerns at this time.    Past Medical History:  Diagnosis Date  . AICD (automatic cardioverter/defibrillator) present   . Arthritis    "used to have a touch in my legs"  . Bradycardia 06/01/2014  . CAD (coronary artery disease)    LHC 9/05 with Dr. Einar Gip:  dLM 20-30%, LAD 85%, oD1 20-30%.  PCI:  Taxus DES to LAD; Dx jailed and tx with POBA.  Last myoview 12/10: inf scar, no ischemia, EF 29%.  . CHF (congestive heart failure) ([place])   . Crohn's disease ([place])   . Dementia   . Diabetes mellitus    11/05/11 "borderline; don't take medications"  . Dilated cardiomyopathy ([place])    2/12: EF 30-35%, trivial AI, mild RAE.  EF 2014 40 -45%  . DVT (deep venous thrombosis) ([place])   . HCAP (healthcare-associated pneumonia) 09/29/2015  . HLD (hyperlipidemia)   . 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 Hyperlipidemia   . Hypertension   . Memory difficulties   . Nephrolithiasis   . PFO (patent foramen ovale)    Not mentioned on 2014 echo.  . Pneumonia ~ 2011   09-22-13 denies any recent SOB or breathing problems  . Pulmonary embolus (HCC)    chronic coumadin  . Stroke [facility]) 1993   "left arm can't hold steady; leg too"  . Swelling of both ankles     09-22-13 occ.feet, but denies pain.    Past Surgical History:  Procedure Laterality Date  . APPENDECTOMY    . COLON SURGERY  1994; 1996   "for Crohn's disease"  . IMPLANTABLE  CARDIOVERTER DEFIBRILLATOR IMPLANT N/A 06/02/2014   Procedure: IMPLANTABLE CARDIOVERTER DEFIBRILLATOR IMPLANT;  Surgeon: [name], MD;  Location: [facility] CATH LAB;  Service: Cardiovascular;  Laterality: N/A;    Social History   Social History  . Marital status: Married    Spouse name: N/A  . Number of children: N/A  . Years of education: N/A   Occupational History  . Not on file.   Social History Main Topics  . Smoking status: Current Some Day Smoker    Packs/day: 0.50    Years: 53.00    Types: Cigarettes  . Smokeless tobacco: Never Used  . Alcohol use No  . Drug use: No  . Sexual activity: Not Currently   Other Topics Concern  . Not on file   Social History Narrative  . No narrative on file   Family History  Problem Relation Age of Onset  . Diabetes type II Mother   . CAD Father   . Diabetes type II Brother       VITAL SIGNS BP 124/63   Pulse 63   Temp 97.4 F (36.3 C)   Resp 16   Ht 5' 3"  (1.6 m)   Wt 131 lb 9.6 oz (59.7 kg)  SpO2 93%   BMI 23.31 kg/m   Patient's Medications  New Prescriptions   No medications on file  Previous Medications   ACETAMINOPHEN (TYLENOL) 500 MG TABLET    Take 1,000 mg by mouth 2 (two) times daily.    ASPIRIN 81 MG TABLET    Take 81 mg by mouth daily.   ATORVASTATIN (LIPITOR) 10 MG TABLET    Take 10 mg by mouth at bedtime.   DONEPEZIL (ARICEPT) 10 MG TABLET    Take 10 mg by mouth at bedtime.    FLUTICASONE (FLONASE) 50 MCG/ACT NASAL SPRAY    Place 2 sprays into both nostrils at bedtime.   LATANOPROST (XALATAN) 0.005 % OPHTHALMIC SOLUTION    Place 1 drop into both eyes at bedtime.   LEVALBUTEROL (XOPENEX) 0.63 MG/3ML NEBULIZER SOLUTION    Take 3 mLs (0.63 mg total) by nebulization every 6 (six) hours as needed for wheezing or shortness of breath.   LEVETIRACETAM (KEPPRA) 500 MG TABLET    Take 1 tablet (500 mg total) by mouth 2 (two) times daily.   NUTRITIONAL SUPPLEMENTS (NUTRITIONAL SUPPLEMENT PO)    Give 237 ml by mouth  two times daily for Health Supplement   RIVAROXABAN (XARELTO) 20 MG TABS TABLET    Take 20 mg by mouth at bedtime.   SENNA (SENOKOT) 8.6 MG TABS TABLET    Take 1 tablet by mouth every morning.   SERTRALINE (ZOLOFT) 50 MG TABLET    Take 50 mg by mouth every morning.    TAMSULOSIN (FLOMAX) 0.4 MG CAPS CAPSULE    Take 0.4 mg by mouth at bedtime.   TRAMADOL (ULTRAM) 50 MG TABLET    Take 50 mg by mouth 2 (two) times daily.  Modified Medications   No medications on file  Discontinued Medications   No medications on file     SIGNIFICANT DIAGNOSTIC EXAMS   09-08-15: ct of head and cervical spine: 1. No evidence of acute intracranial or cervical spine injury. 2. Extensive chronic ischemic injury as described, stable from 12/21/2014 comparison.  09-09-15: EEG: Clinical Interpretation: This essentially normal EEG is recorded in the waking and sleep state. There was no seizure or seizure predisposition recorded on this study. Please note that a normal EEG does not preclude the possibility of epilepsy.  09-29-15 ct of head: No acute abnormality. Extensive atrophy and multiple remote infarctions as seen on prior exams  09-30-15: swallow study: Regular solids;Thin liquid   10-05-15: chest x-ray: Retrocardiac density may represent atelectasis versus infiltrate.  10-06-15: right upper quad ultrasound: 1.  Small gallstone.  No biliary distention. 2. Liver is slightly echogenic suggesting fatty infiltration .  10-06-15: 2-d echo:   - Left ventricle: The cavity size was normal. Systolic function was moderately to severely reduced. The estimated ejection fraction was in the range of 30% to 35%. Diffuse hypokinesis. Doppler parameters are consistent with abnormal left ventricular relaxation (grade 1 diastolic dysfunction). There was no evidence of elevated ventricular filling pressure by Doppler parameters. - Aortic valve: There was mild regurgitation. - Aortic root: The aortic root was normal in size. - Mitral  valve: Structurally normal valve. There was mild regurgitation. - Left atrium: The atrium was mildly dilated. - Right ventricle: Pacer wire or catheter noted in right ventricle. Systolic function was normal. - Right atrium: The atrium was moderately dilated. Pacer wire or catheter noted in right atrium. - Tricuspid valve: There was mild regurgitation. - Pulmonary arteries: Systolic pressure was mildly increased. PA peak pressure: 39  mm Hg (S). - Inferior vena cava: The vessel was dilated. The respirophasic diameter changes were blunted (< 50%), consistent with elevated central venous pressure. - Pericardium, extracardiac: There was no pericardial effusion. Impressions: - When compared to the prior study from 05/31/14 the LVEF has improved, now 30-35% with diffuse hypokinesis.  10-11-15: chest x-ray: right lower lobe atelectasis; no active TB.   11-16-15: left hip and pelvis: no acute osseous abnormalities.   03-26-16: ct of head: 1. Old cerebral and cerebellar infarcts without acute findings. 2. Periventricular white matter and corona radiata hypodensities favor chronic ischemic microvascular white matter disease.   03-27-16: EEG: This awake and drowsy EEG is abnormal due to the presence of: 1. Mild diffuse slowing of the waking background 2. Focal slowing over the left temporal region 3. Rare sharp waves over the left temporal region Clinical Correlation of the above findings indicates diffuse cerebral dysfunction that is non-specific in etiology and can be seen with hypoxic/ischemic injury, toxic/metabolic encephalopathies, neurodegenerative disorders, or medication effect. Focal slowing over the left temporal region indicates focal cerebral dysfunction in this region suggestive of underlying structural or physiologic abnormality. There is a possible tendency for seizures to arise from this region. There were no electrographic seizures in this study. Clinical correlation is advised.   06-05-16:  bilateral hip x-ray: 1. Degenerative changes lumbar spine and both hips. No acute abnormality. 2.  Aortoiliac atherosclerotic vascular disease.   06-05-16: ct of head and cervical spine: 1. No evidence of significant acute traumatic injury to the skull, brain or cervical spine. 2. Mild cerebral atrophy with extensive chronic microvascular ischemic changes, multiple lacunar infarcts in the basal ganglia and right thalamus, and old bilateral (left greater than right) cerebellar infarcts, as above. 3. Multilevel degenerative disc disease and cervical spondylosis, as above.    LABS REVIEWED:   09-08-15: wbc 7.2; hgb 7.3; hct 26.5; mcv 69.6; plt clump; glucose 218; bun 15; creat 1.31; k+ 4.0; na++ 137; liver normal albumin 3.0; mag 1.7; hgb a1c 6.8; tsh 1.135 09-10-15: wbc 9.8; hgb 9.0; hxct 31.2. mcv 71.6; plt 179; glucose 75; bun 14; creat 1.16; k+ 4.1; na++ 139 09-11-15: wbc 10.4; hg 8.7; hct 30.1; mcv 72.5; plt 208  09-15-15: wbc 9.2; hgb 9.8; hct 35.6; mcv 72.0; plt 251; glucose 133; bun 18.1; creat 1.04; k+ 4.6; na++ 140; liver normal albumin 3.0; chol 135; ldl 37; tri 82; hdl 81; hgb a1c 5.5 PSA 3.60  10-08-15: wbc 13.7; hgb 9.9; hct 33.3; mcv 74.0; plt 375; glucose 113; bun 23; creat 1.20; k+ 4.6; na++ 136  03-26-16: wbc 11.4 hgb 10.0; hct 34.5; mcv 71.7; plt 216; glucose 136; bun 16; creat 1.45; k+ 4.2; na++ 138; liver normal albumin 3.2 03-28-16: glucose 114; bun 20; creat 1.45; k+ 4.2; na++ 138  04-01-16: wbc 6.3; hgb 9.9; hct 35.4; mcv 75.0; plt 210; glucose 86; bun 17.8; creat 1.15; k+ 4.7; na++ 140; liver normal albumin 3.8; chol 157; ldl 67; trig 119; hdl 66 04-04-16: wbc 10.1; hgb 10.5; hct 37.3; mcv 75.4; plt 229; glucose 199; bun 19.6; creat 1.28; k+ 4.2; na++ 140  04-15-16: hgb a1c 6.9  05-11-16: wbc 9.4; hgb 9.8; hct 33.6; mcv 71.9; plt 198; glucose 190; bun 18; creat 1.44; k+ 4.5; na++ 135  07-31-16: wbc 5.6; hgb 10.4; hct 36.5; mcv 74.3; plt 236; glucose 74; bun 22.1; creat 1.19; k+ 4.9;  na++ 143; liver normal albumin 3.8; hgb a1c 6.4    Review of Systems  Constitutional: Negative for malaise/fatigue.  Respiratory: Negative for cough and shortness of breath.   Cardiovascular: Negative for chest pain, palpitations and leg swelling.  Gastrointestinal: Negative for abdominal pain, constipation and heartburn.  Musculoskeletal: Negative for back pain, joint pain and myalgias.  Skin: Negative.   Neurological: Negative for dizziness.  Psychiatric/Behavioral: The patient is not nervous/anxious.      Physical Exam  Constitutional: No distress.  Frail Eyes: Conjunctivae are normal.  Neck: Neck supple. No JVD present. No thyromegaly present.  Cardiovascular: Normal rate, regular rhythm and intact distal pulses.   Respiratory: Effort normal and breath sounds normal. No respiratory distress. He has no wheezes.  GI: Soft. Bowel sounds are normal. He exhibits no distension. There is no tenderness.  Musculoskeletal: He exhibits no edema.  Left hemiparesis  Lymphadenopathy:    He has no cervical adenopathy.  Neurological: He is alert.  Skin: Skin is warm and dry. He is not diaphoretic.  Psychiatric: He has a normal mood and affect.    ASSESSMENT/ PLAN:   1.  Bilateral lower extremity pain:  Continue  tylenol  1 gm twice daily and ultram 50 mg twice daily  2. Dyslipidemia: will continue lipitor 10 mg daily ldl is 67   3. Dementia: without significant change; his current weight is 131 pounds is presently stable; will continue aricept 10 mg nightly   4. BPH: will continue flomax 0.4 mg daily   5. Chronic diastoic heart failure: EF 30-35% (10-06-15) is status post ICD placement (interrogated; now pace maker function only)   is currently not on medications; will not make changes will monitor  6. PE: is stable will continue xarelto 20 mg daily   7. CVA: is neurologically stable will continue xarelto 20 mg daily   8. Depression: will continue zoloft 50 mg daily   9. CAD: no  indications of chest pain present; will continue asa 81 mg daily  10. Glaucoma: will continue xalatan to both eyes.   11. Crohn's disease: has constipation: will continue senna daily   12. COPD: has xopenex neb every 6 hours as needed  will not make changes will monitor his status.   13. CKD stage III: bun/creat 22.1/1.19  14. Hypotension: b/p 124/63  is currently not on medications; will not make changes will monitor   15. Seizures: no reports of recent seizure activities; will continue keppra 500 mg twice daily and will monitor  16. Diabetes: hgb a1c 6.4 currently not on medications; unable to tolerate ace due to low blood pressure      MD is aware of resident's narcotic use and is in agreement with current plan of care. We will attempt to wean resident as apropriate   Ok Edwards NP [facility]  Contact [phone] Monday through Friday 8am- 5pm  After hours call [phone]

## 2016-09-03 ENCOUNTER — Non-Acute Institutional Stay (SKILLED_NURSING_FACILITY): Payer: Medicare Other | Admitting: Adult Health

## 2016-09-03 ENCOUNTER — Encounter: Payer: Self-pay | Admitting: Adult Health

## 2016-09-03 DIAGNOSIS — R451 Restlessness and agitation: Secondary | ICD-10-CM | POA: Diagnosis not present

## 2016-09-03 DIAGNOSIS — F0151 Vascular dementia with behavioral disturbance: Secondary | ICD-10-CM

## 2016-09-03 DIAGNOSIS — F01518 Vascular dementia, unspecified severity, with other behavioral disturbance: Secondary | ICD-10-CM

## 2016-09-03 NOTE — Progress Notes (Signed)
Location:   [place] Room Number: 107 B Place of Service:  SNF (31)   CODE STATUS: Full Code  Allergies  Allergen Reactions  . Tuberculin Tests Other (See Comments)    Per MAR (no description provided)    Chief Complaint  Patient presents with  . Acute Visit    Increased Agitation    HPI:  Staff reports that last night he was agitated and was physically aggressive with others. He tells me this morning that he feels good. He has no concerns and told me that he did not sleep well. He did talk about missing his family.   Past Medical History:  Diagnosis Date  . AICD (automatic cardioverter/defibrillator) present   . Arthritis    "used to have a touch in my legs"  . Bradycardia 06/01/2014  . CAD (coronary artery disease)    LHC 9/05 with Dr. Einar Gip:  dLM 20-30%, LAD 85%, oD1 20-30%.  PCI:  Taxus DES to LAD; Dx jailed and tx with POBA.  Last myoview 12/10: inf scar, no ischemia, EF 29%.  . CHF (congestive heart failure) ([place])   . Crohn's disease ([place])   . Dementia   . Diabetes mellitus    11/05/11 "borderline; don't take medications"  . Dilated cardiomyopathy ([place])    2/12: EF 30-35%, trivial AI, mild RAE.  EF 2014 40 -45%  . DVT (deep venous thrombosis) ([place])   . HCAP (healthcare-associated pneumonia) 09/29/2015  . HLD (hyperlipidemia)   . 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 Hyperlipidemia   . Hypertension   . Memory difficulties   . Nephrolithiasis   . PFO (patent foramen ovale)    Not mentioned on 2014 echo.  . Pneumonia ~ 2011   09-22-13 denies any recent SOB or breathing problems  . Pulmonary embolus (HCC)    chronic coumadin  . Stroke [facility]) 1993   "left arm can't hold steady; leg too"  . Swelling of both ankles     09-22-13 occ.feet, but denies pain.    Past Surgical History:  Procedure Laterality Date  . APPENDECTOMY    . COLON SURGERY  1994; 1996   "for Crohn's disease"  . IMPLANTABLE CARDIOVERTER DEFIBRILLATOR IMPLANT N/A 06/02/2014   Procedure:  IMPLANTABLE CARDIOVERTER DEFIBRILLATOR IMPLANT;  Surgeon: [name], MD;  Location: [facility] CATH LAB;  Service: Cardiovascular;  Laterality: N/A;    Social History   Social History  . Marital status: Married    Spouse name: N/A  . Number of children: N/A  . Years of education: N/A   Occupational History  . Not on file.   Social History Main Topics  . Smoking status: Current Some Day Smoker    Packs/day: 0.50    Years: 53.00    Types: Cigarettes  . Smokeless tobacco: Never Used  . Alcohol use No  . Drug use: No  . Sexual activity: Not Currently   Other Topics Concern  . Not on file   Social History Narrative  . No narrative on file   Family History  Problem Relation Age of Onset  . Diabetes type II Mother   . CAD Father   . Diabetes type II Brother       VITAL SIGNS BP (!) 118/58   Pulse 93   Temp 97.1 F (36.2 C)   Resp 16   Ht 5' 3"  (1.6 m)   Wt 131 lb 9.6 oz (59.7 kg)   SpO2 99%   BMI 23.31 kg/m   Patient's Medications  New Prescriptions   No medications on file  Previous Medications   ACETAMINOPHEN (TYLENOL) 500 MG TABLET    Take 1,000 mg by mouth 2 (two) times daily.    ASPIRIN 81 MG TABLET    Take 81 mg by mouth daily.   ATORVASTATIN (LIPITOR) 10 MG TABLET    Take 10 mg by mouth at bedtime.   DONEPEZIL (ARICEPT) 10 MG TABLET    Take 10 mg by mouth at bedtime.    FLUTICASONE (FLONASE) 50 MCG/ACT NASAL SPRAY    Place 2 sprays into both nostrils at bedtime.   LATANOPROST (XALATAN) 0.005 % OPHTHALMIC SOLUTION    Place 1 drop into both eyes at bedtime.   LEVALBUTEROL (XOPENEX) 0.63 MG/3ML NEBULIZER SOLUTION    Take 3 mLs (0.63 mg total) by nebulization every 6 (six) hours as needed for wheezing or shortness of breath.   LEVETIRACETAM (KEPPRA) 500 MG TABLET    Take 1 tablet (500 mg total) by mouth 2 (two) times daily.   NUTRITIONAL SUPPLEMENTS (NUTRITIONAL SUPPLEMENT PO)    Give 237 ml by mouth two times daily for Health Supplement   RIVAROXABAN  (XARELTO) 20 MG TABS TABLET    Take 20 mg by mouth at bedtime.   SENNA (SENOKOT) 8.6 MG TABS TABLET    Take 1 tablet by mouth every morning.   SERTRALINE (ZOLOFT) 50 MG TABLET    Take 50 mg by mouth every morning.    TAMSULOSIN (FLOMAX) 0.4 MG CAPS CAPSULE    Take 0.4 mg by mouth at bedtime.   TRAMADOL (ULTRAM) 50 MG TABLET    Take 50 mg by mouth 2 (two) times daily.  Modified Medications   No medications on file  Discontinued Medications   No medications on file     SIGNIFICANT DIAGNOSTIC EXAMS   09-08-15: ct of head and cervical spine: 1. No evidence of acute intracranial or cervical spine injury. 2. Extensive chronic ischemic injury as described, stable from 12/21/2014 comparison.  09-09-15: EEG: Clinical Interpretation: This essentially normal EEG is recorded in the waking and sleep state. There was no seizure or seizure predisposition recorded on this study. Please note that a normal EEG does not preclude the possibility of epilepsy.  09-29-15 ct of head: No acute abnormality. Extensive atrophy and multiple remote infarctions as seen on prior exams  09-30-15: swallow study: Regular solids;Thin liquid   10-05-15: chest x-ray: Retrocardiac density may represent atelectasis versus infiltrate.  10-06-15: right upper quad ultrasound: 1.  Small gallstone.  No biliary distention. 2. Liver is slightly echogenic suggesting fatty infiltration .  10-06-15: 2-d echo:   - Left ventricle: The cavity size was normal. Systolic function was moderately to severely reduced. The estimated ejection fraction was in the range of 30% to 35%. Diffuse hypokinesis. Doppler parameters are consistent with abnormal left ventricular relaxation (grade 1 diastolic dysfunction). There was no evidence of elevated ventricular filling pressure by Doppler parameters. - Aortic valve: There was mild regurgitation. - Aortic root: The aortic root was normal in size. - Mitral valve: Structurally normal valve. There was mild  regurgitation. - Left atrium: The atrium was mildly dilated. - Right ventricle: Pacer wire or catheter noted in right ventricle. Systolic function was normal. - Right atrium: The atrium was moderately dilated. Pacer wire or catheter noted in right atrium. - Tricuspid valve: There was mild regurgitation. - Pulmonary arteries: Systolic pressure was mildly increased. PA peak pressure: 39 mm Hg (S). - Inferior vena cava: The vessel was dilated. The  respirophasic diameter changes were blunted (< 50%), consistent with elevated central venous pressure. - Pericardium, extracardiac: There was no pericardial effusion. Impressions: - When compared to the prior study from 05/31/14 the LVEF has improved, now 30-35% with diffuse hypokinesis.  10-11-15: chest x-ray: right lower lobe atelectasis; no active TB.   11-16-15: left hip and pelvis: no acute osseous abnormalities.   03-26-16: ct of head: 1. Old cerebral and cerebellar infarcts without acute findings. 2. Periventricular white matter and corona radiata hypodensities favor chronic ischemic microvascular white matter disease.   03-27-16: EEG: This awake and drowsy EEG is abnormal due to the presence of: 1. Mild diffuse slowing of the waking background 2. Focal slowing over the left temporal region 3. Rare sharp waves over the left temporal region Clinical Correlation of the above findings indicates diffuse cerebral dysfunction that is non-specific in etiology and can be seen with hypoxic/ischemic injury, toxic/metabolic encephalopathies, neurodegenerative disorders, or medication effect. Focal slowing over the left temporal region indicates focal cerebral dysfunction in this region suggestive of underlying structural or physiologic abnormality. There is a possible tendency for seizures to arise from this region. There were no electrographic seizures in this study. Clinical correlation is advised.   06-05-16: bilateral hip x-ray: 1. Degenerative changes  lumbar spine and both hips. No acute abnormality. 2.  Aortoiliac atherosclerotic vascular disease.   06-05-16: ct of head and cervical spine: 1. No evidence of significant acute traumatic injury to the skull, brain or cervical spine. 2. Mild cerebral atrophy with extensive chronic microvascular ischemic changes, multiple lacunar infarcts in the basal ganglia and right thalamus, and old bilateral (left greater than right) cerebellar infarcts, as above. 3. Multilevel degenerative disc disease and cervical spondylosis, as above.    LABS REVIEWED:   09-08-15: wbc 7.2; hgb 7.3; hct 26.5; mcv 69.6; plt clump; glucose 218; bun 15; creat 1.31; k+ 4.0; na++ 137; liver normal albumin 3.0; mag 1.7; hgb a1c 6.8; tsh 1.135 09-10-15: wbc 9.8; hgb 9.0; hxct 31.2. mcv 71.6; plt 179; glucose 75; bun 14; creat 1.16; k+ 4.1; na++ 139 09-11-15: wbc 10.4; hg 8.7; hct 30.1; mcv 72.5; plt 208  09-15-15: wbc 9.2; hgb 9.8; hct 35.6; mcv 72.0; plt 251; glucose 133; bun 18.1; creat 1.04; k+ 4.6; na++ 140; liver normal albumin 3.0; chol 135; ldl 37; tri 82; hdl 81; hgb a1c 5.5 PSA 3.60  10-08-15: wbc 13.7; hgb 9.9; hct 33.3; mcv 74.0; plt 375; glucose 113; bun 23; creat 1.20; k+ 4.6; na++ 136  03-26-16: wbc 11.4 hgb 10.0; hct 34.5; mcv 71.7; plt 216; glucose 136; bun 16; creat 1.45; k+ 4.2; na++ 138; liver normal albumin 3.2 03-28-16: glucose 114; bun 20; creat 1.45; k+ 4.2; na++ 138  04-01-16: wbc 6.3; hgb 9.9; hct 35.4; mcv 75.0; plt 210; glucose 86; bun 17.8; creat 1.15; k+ 4.7; na++ 140; liver normal albumin 3.8; chol 157; ldl 67; trig 119; hdl 66 04-04-16: wbc 10.1; hgb 10.5; hct 37.3; mcv 75.4; plt 229; glucose 199; bun 19.6; creat 1.28; k+ 4.2; na++ 140  04-15-16: hgb a1c 6.9  05-11-16: wbc 9.4; hgb 9.8; hct 33.6; mcv 71.9; plt 198; glucose 190; bun 18; creat 1.44; k+ 4.5; na++ 135  07-31-16: wbc 5.6; hgb 10.4; hct 36.5; mcv 74.3; plt 236; glucose 74; bun 22.1; creat 1.19; k+ 4.9; na++ 143; liver normal albumin 3.8; hgb a1c  6.4    Review of Systems  Constitutional: Negative for malaise/fatigue.  Respiratory: Negative for cough and shortness of breath.   Cardiovascular:  Negative for chest pain, palpitations and leg swelling.  Gastrointestinal: Negative for abdominal pain, constipation and heartburn.  Musculoskeletal: Negative for back pain, joint pain and myalgias.  Skin: Negative.   Neurological: Negative for dizziness.  Psychiatric/Behavioral: The patient is not nervous/anxious.      Physical Exam  Constitutional: No distress.  Frail Eyes: Conjunctivae are normal.  Neck: Neck supple. No JVD present. No thyromegaly present.  Cardiovascular: Normal rate, regular rhythm and intact distal pulses.   Respiratory: Effort normal and breath sounds normal. No respiratory distress. He has no wheezes.  GI: Soft. Bowel sounds are normal. He exhibits no distension. There is no tenderness.  Musculoskeletal: He exhibits no edema.  Left hemiparesis  Lymphadenopathy:    He has no cervical adenopathy.  Neurological: He is alert.  Skin: Skin is warm and dry. He is not diaphoretic.  Psychiatric: He has a normal mood and affect.    ASSESSMENT/ PLAN:  1. Dementia: without significant change; his current weight is 131 pounds is presently stable; will continue aricept 10 mg nightly   2. Agitation: staff reports he is having a difficult time sleeping at night; will begin him on trazodone 25 mg nightly to help with his sleep.    MD is aware of resident's narcotic use and is in agreement with current plan of care. We will attempt to wean resident as apropriate   Ok Edwards NP [facility]  Contact [phone] Monday through Friday 8am- 5pm  After hours call [phone]

## 2016-09-04 DIAGNOSIS — J309 Allergic rhinitis, unspecified: Secondary | ICD-10-CM | POA: Insufficient documentation

## 2016-09-09 DIAGNOSIS — F01518 Vascular dementia, unspecified severity, with other behavioral disturbance: Secondary | ICD-10-CM | POA: Insufficient documentation

## 2016-09-09 DIAGNOSIS — F0151 Vascular dementia with behavioral disturbance: Secondary | ICD-10-CM | POA: Insufficient documentation

## 2016-09-12 ENCOUNTER — Non-Acute Institutional Stay (SKILLED_NURSING_FACILITY): Payer: Medicare Other | Admitting: Internal Medicine

## 2016-09-12 ENCOUNTER — Encounter: Payer: Self-pay | Admitting: Internal Medicine

## 2016-09-12 DIAGNOSIS — R4182 Altered mental status, unspecified: Secondary | ICD-10-CM | POA: Diagnosis not present

## 2016-09-12 DIAGNOSIS — F01518 Vascular dementia, unspecified severity, with other behavioral disturbance: Secondary | ICD-10-CM

## 2016-09-12 DIAGNOSIS — F0280 Dementia in other diseases classified elsewhere without behavioral disturbance: Secondary | ICD-10-CM

## 2016-09-12 DIAGNOSIS — F329 Major depressive disorder, single episode, unspecified: Secondary | ICD-10-CM

## 2016-09-12 DIAGNOSIS — F0151 Vascular dementia with behavioral disturbance: Secondary | ICD-10-CM | POA: Diagnosis not present

## 2016-09-12 DIAGNOSIS — F0393 Unspecified dementia, unspecified severity, with mood disturbance: Secondary | ICD-10-CM

## 2016-09-12 NOTE — Progress Notes (Signed)
Patient ID: Grant Lara, male   DOB: 07-05-1940, 76 y.o.   MRN: [id number]    DATE: 09/12/2016   Location:    [place] Room Number: 107 B Place of Service: SNF (31)   Extended Emergency Contact Information Primary Emergency Contact: Allemand,[name] Address: [address]          [place], [place] [address] [place] Phone: [phone] Mobile Phone: [phone] Relation: Spouse Secondary Emergency Contact: [name],[name]          [place], [place] Mobile Phone: [phone] Relation: Daughter  Advanced Directive information Does Patient Have a Medical Advance Directive?: Yes, Type of Advance Directive: Out of facility DNR (pink MOST or yellow form), Pre-existing out of facility DNR order (yellow form or pink MOST form): Pink MOST form placed in chart (order not valid for inpatient use), Does patient want to make changes to medical advance directive?: No - Patient declined  Chief Complaint  Patient presents with  . Acute Visit    Confusion and agitation    HPI:  76 yo male long term resident seen today for confusion and agitation. Nursing reports pt with increased behavior issues that are cyclic and intermittent. No falls. No f/c. He is a poor historian due to dementia. Hx obtained from chart. He was recently seen by NP for agitation at night and was started on trazodone.  Dementia - cognition stable on aricept 10 mg nightly. He takes trazodone qhs to help sleep as he had increased difficulty sleeping at night.   Depression - currently on zoloft 50 mg daily    Past Medical History:  Diagnosis Date  . AICD (automatic cardioverter/defibrillator) present   . Arthritis    "used to have a touch in my legs"  . Bradycardia 06/01/2014  . CAD (coronary artery disease)    LHC 9/05 with Dr. Einar Gip:  dLM 20-30%, LAD 85%, oD1 20-30%.  PCI:  Taxus DES to LAD; Dx jailed and tx with POBA.  Last myoview 12/10: inf scar, no ischemia, EF 29%.  .  CHF (congestive heart failure) ([place])   . Crohn's disease ([place])   . Dementia   . Diabetes mellitus    11/05/11 "borderline; don't take medications"  . Dilated cardiomyopathy ([place])    2/12: EF 30-35%, trivial AI, mild RAE.  EF 2014 40 -45%  . DVT (deep venous thrombosis) ([place])   . HCAP (healthcare-associated pneumonia) 09/29/2015  . HLD (hyperlipidemia)   . 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 Hyperlipidemia   . Hypertension   . Memory difficulties   . Nephrolithiasis   . PFO (patent foramen ovale)    Not mentioned on 2014 echo.  . Pneumonia ~ 2011   09-22-13 denies any recent SOB or breathing problems  . Pulmonary embolus (HCC)    chronic coumadin  . Stroke [facility]) 1993   "left arm can't hold steady; leg too"  . Swelling of both ankles     09-22-13 occ.feet, but denies pain.    Past Surgical History:  Procedure Laterality Date  . APPENDECTOMY    . COLON SURGERY  1994; 1996   "for Crohn's disease"  . IMPLANTABLE CARDIOVERTER DEFIBRILLATOR IMPLANT N/A 06/02/2014   Procedure: IMPLANTABLE CARDIOVERTER DEFIBRILLATOR IMPLANT;  Surgeon: [name], MD;  Location: [facility] CATH LAB;  Service: Cardiovascular;  Laterality: N/A;    Patient Care Team: Maury Dus, MD as PCP - General (Family Medicine)  Social History   Social History  . Marital status: Married  Spouse name: N/A  . Number of children: N/A  . Years of education: N/A   Occupational History  . Not on file.   Social History Main Topics  . Smoking status: Current Some Day Smoker    Packs/day: 0.50    Years: 53.00    Types: Cigarettes  . Smokeless tobacco: Never Used  . Alcohol use No  . Drug use: No  . Sexual activity: Not Currently   Other Topics Concern  . Not on file   Social History Narrative  . No narrative on file     reports that he has been smoking Cigarettes.  He has a 26.50 pack-year smoking history. He has never used smokeless tobacco. He reports that he does not drink alcohol or use drugs.  Family  History  Problem Relation Age of Onset  . Diabetes type II Mother   . CAD Father   . Diabetes type II Brother    Family Status  Relation Status  . Mother Deceased  . Father Deceased  . PGF Deceased  . PGM Deceased  . MGF Deceased  . MGM Deceased  . Brother (Not Specified)    Immunization History  Administered Date(s) Administered  . Influenza Whole 02/09/2010  . Influenza-Unspecified 01/21/2016  . PPD Test 11/05/2011, 10/09/2015  . Pneumococcal Polysaccharide-23 02/19/2010  . Tdap 06/05/2016    Allergies  Allergen Reactions  . Tuberculin Tests Other (See Comments)    Per MAR (no description provided)    Medications: Patient's Medications  New Prescriptions   No medications on file  Previous Medications   ACETAMINOPHEN (TYLENOL) 500 MG TABLET    Take 1,000 mg by mouth 2 (two) times daily.    ASPIRIN 81 MG TABLET    Take 81 mg by mouth daily.   ATORVASTATIN (LIPITOR) 10 MG TABLET    Take 10 mg by mouth at bedtime.   DONEPEZIL (ARICEPT) 10 MG TABLET    Take 10 mg by mouth at bedtime.    FLUTICASONE (FLONASE) 50 MCG/ACT NASAL SPRAY    Place 2 sprays into both nostrils at bedtime.   LATANOPROST (XALATAN) 0.005 % OPHTHALMIC SOLUTION    Place 1 drop into both eyes at bedtime.   LEVALBUTEROL (XOPENEX) 0.63 MG/3ML NEBULIZER SOLUTION    Take 3 mLs (0.63 mg total) by nebulization every 6 (six) hours as needed for wheezing or shortness of breath.   LEVETIRACETAM (KEPPRA) 500 MG TABLET    Take 1 tablet (500 mg total) by mouth 2 (two) times daily.   NUTRITIONAL SUPPLEMENTS (NUTRITIONAL SUPPLEMENT PO)    Give 237 ml by mouth two times daily for Health Supplement   POLYVINYL ALCOHOL (LIQUIFILM TEARS) 1.4 % OPHTHALMIC SOLUTION    Place 1 drop into both eyes 2 (two) times daily.   RIVAROXABAN (XARELTO) 20 MG TABS TABLET    Take 20 mg by mouth at bedtime.   SENNA (SENOKOT) 8.6 MG TABS TABLET    Take 1 tablet by mouth every morning.   SERTRALINE (ZOLOFT) 50 MG TABLET    Take 50 mg by  mouth every morning.    TAMSULOSIN (FLOMAX) 0.4 MG CAPS CAPSULE    Take 0.4 mg by mouth at bedtime.   TRAMADOL (ULTRAM) 50 MG TABLET    Take 50 mg by mouth 2 (two) times daily.   TRAZODONE (DESYREL) 50 MG TABLET    Take 25 mg by mouth at bedtime.  Modified Medications   No medications on file  Discontinued Medications   No medications  on file    Review of Systems  Unable to perform ROS: Dementia    Vitals:   09/12/16 1541  BP: 139/66  Pulse: 74  Temp: 98.1 F (36.7 C)  TempSrc: Oral  SpO2: 94%  Weight: 131 lb 9.6 oz (59.7 kg)  Height: 5' 3"  (1.6 m)   Body mass index is 23.31 kg/m.  Physical Exam  Constitutional: He appears well-developed and well-nourished.  Frail appearing in NAD; sitting up in bed  HENT:  Mouth/Throat: Oropharynx is clear and moist.  MMM; no oral thrush  Eyes: Pupils are equal, round, and reactive to light. No scleral icterus.  Neck: Neck supple. Carotid bruit is not present.  Cardiovascular: Normal rate, regular rhythm and intact distal pulses.  Exam reveals no gallop and no friction rub.   Murmur (1/6 SEM) heard. No distal LE edema. No calf TTP  Pulmonary/Chest: Effort normal and breath sounds normal. No respiratory distress. He has no wheezes. He has no rales. He exhibits no tenderness.  Abdominal: Soft. Normal appearance and bowel sounds are normal. He exhibits no distension, no abdominal bruit, no pulsatile midline mass and no mass. There is no hepatomegaly. There is no tenderness. There is no rigidity, no rebound and no guarding. No hernia.  Musculoskeletal: He exhibits edema.  Lymphadenopathy:    He has no cervical adenopathy.  Neurological: He is alert.  Skin: Skin is warm and dry. No rash noted.  Psychiatric: He has a normal mood and affect. His behavior is normal.     Labs reviewed: Abstract on 07/31/2016  Component Date Value Ref Range Status  . Hemoglobin 07/31/2016 10.4* 13.5 - 17.5 g/dL Final  . HCT 07/31/2016 37* 41 - 53 % Final    . Neutrophils Absolute 07/31/2016 2  /L Final  . Platelets 07/31/2016 236  150 - 399 K/L Final  . WBC 07/31/2016 5.6  10^3/mL Final  . Glucose 07/31/2016 74  mg/dL Final  . BUN 07/31/2016 22* 4 - 21 mg/dL Final  . Creatinine 07/31/2016 1.2  0.6 - 1.3 mg/dL Final  . Potassium 07/31/2016 4.9  3.4 - 5.3 mmol/L Final  . Sodium 07/31/2016 143  137 - 147 mmol/L Final  . Alkaline Phosphatase 07/31/2016 100  25 - 125 U/L Final  . ALT 07/31/2016 11  10 - 40 U/L Final  . AST 07/31/2016 14  14 - 40 U/L Final  . Bilirubin, Total 07/31/2016 0.6  mg/dL Final  . Hemoglobin A1C 07/31/2016 6.4   Final  Abstract on 07/26/2016  Component Date Value Ref Range Status  . Hemoglobin 07/15/2016 9.5* 13.5 - 17.5 g/dL Final  . HCT 07/15/2016 33* 41 - 53 % Final  . Neutrophils Absolute 07/15/2016 4  /L Final  . Platelets 07/15/2016 219  150 - 399 K/L Final  . WBC 07/15/2016 6.9  10^3/mL Final  Abstract on 07/24/2016  Component Date Value Ref Range Status  . Glucose 07/15/2016 236  mg/dL Final  . BUN 07/15/2016 21  4 - 21 mg/dL Final  . Creatinine 07/15/2016 1.3  0.6 - 1.3 mg/dL Final  . Potassium 07/15/2016 4.5  3.4 - 5.3 mmol/L Final  . Sodium 07/15/2016 144  137 - 147 mmol/L Final  . Alkaline Phosphatase 07/15/2016 94  25 - 125 U/L Final  . ALT 07/15/2016 14  10 - 40 U/L Final  . AST 07/15/2016 16  14 - 40 U/L Final  . Bilirubin, Total 07/15/2016 0.5  mg/dL Final  Nursing Home on 07/03/2016  Component Date  Value Ref Range Status  . Hemoglobin 04/01/2016 9.9* 13.5 - 17.5 g/dL Final  . HCT 04/01/2016 35* 41 - 53 % Final  . Neutrophils Absolute 04/01/2016 3  /L Final  . Platelets 04/01/2016 210  150 - 399 K/L Final  . WBC 04/01/2016 6.3  10^3/mL Final  . Glucose 04/01/2016 86  mg/dL Final  . BUN 04/01/2016 18  4 - 21 mg/dL Final  . Creatinine 04/01/2016 1.2  0.6 - 1.3 mg/dL Final  . Potassium 04/01/2016 4.7  3.4 - 5.3 mmol/L Final  . Sodium 04/01/2016 140  137 - 147 mmol/L Final  .  Triglycerides 04/01/2016 119  40 - 160 mg/dL Final  . Cholesterol 04/01/2016 157  0 - 200 mg/dL Final  . HDL 04/01/2016 66  35 - 70 mg/dL Final  . LDL Cholesterol 04/01/2016 67  mg/dL Final  . Alkaline Phosphatase 04/01/2016 103  25 - 125 U/L Final  . ALT 04/01/2016 21  10 - 40 U/L Final  . AST 04/01/2016 19  14 - 40 U/L Final  . Bilirubin, Total 04/01/2016 0.7  mg/dL Final  . Hemoglobin A1C 04/01/2016 6.6   Final  . HM Diabetic Eye Exam 03/25/2016 No Retinopathy  No Retinopathy Final    No results found.   Assessment/Plan   ICD-10-CM   1. Altered mental status, unspecified altered mental status type R41.82    increased confusion  2. 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 CPerlie Gold PAkron [facility] LLCand Adult Medicine [address] [phone] (Monday-Friday 8 AM - 5 PM) [phone] 5 PM and follow prompts

## 2016-09-26 ENCOUNTER — Encounter: Payer: Self-pay | Admitting: Internal Medicine

## 2016-09-26 ENCOUNTER — Non-Acute Institutional Stay (SKILLED_NURSING_FACILITY): Payer: Medicare Other | Admitting: Internal Medicine

## 2016-09-26 DIAGNOSIS — F0280 Dementia in other diseases classified elsewhere without behavioral disturbance: Secondary | ICD-10-CM

## 2016-09-26 DIAGNOSIS — N183 Chronic kidney disease, stage 3 unspecified: Secondary | ICD-10-CM

## 2016-09-26 DIAGNOSIS — F0393 Unspecified dementia, unspecified severity, with mood disturbance: Secondary | ICD-10-CM

## 2016-09-26 DIAGNOSIS — E1122 Type 2 diabetes mellitus with diabetic chronic kidney disease: Secondary | ICD-10-CM

## 2016-09-26 DIAGNOSIS — F0151 Vascular dementia with behavioral disturbance: Secondary | ICD-10-CM | POA: Diagnosis not present

## 2016-09-26 DIAGNOSIS — R1032 Left lower quadrant pain: Secondary | ICD-10-CM

## 2016-09-26 DIAGNOSIS — Z8673 Personal history of transient ischemic attack (TIA), and cerebral infarction without residual deficits: Secondary | ICD-10-CM

## 2016-09-26 DIAGNOSIS — F01518 Vascular dementia, unspecified severity, with other behavioral disturbance: Secondary | ICD-10-CM

## 2016-09-26 DIAGNOSIS — F329 Major depressive disorder, single episode, unspecified: Secondary | ICD-10-CM | POA: Diagnosis not present

## 2016-09-26 DIAGNOSIS — R569 Unspecified convulsions: Secondary | ICD-10-CM

## 2016-09-26 DIAGNOSIS — R627 Adult failure to thrive: Secondary | ICD-10-CM

## 2016-09-26 DIAGNOSIS — I2782 Chronic pulmonary embolism: Secondary | ICD-10-CM

## 2016-09-26 NOTE — Progress Notes (Signed)
DATE:  September 26, 2016  Location:   [place] Room Number: [place] of Service: SNF (31)   Extended Emergency Contact Information Primary Emergency Contact: Glaze,[name] Address: [address]          [place],  [address] [place] Phone: [phone] Mobile Phone: [phone] Relation: Spouse Secondary Emergency Contact: [name],[name]          [place], [place] of [place] Mobile Phone: [phone] Relation: Daughter  Advanced Directive information Does Patient Have a Medical Advance Directive?: Yes, Type of Advance Directive: Out of facility DNR (pink MOST or yellow form), Pre-existing out of facility DNR order (yellow form or pink MOST form): Pink MOST form placed in chart (order not valid for inpatient use), Does patient want to make changes to medical advance directive?: No - Patient declined  Chief Complaint  Patient presents with  . Medical Management of Chronic Issues    1 month follow up    HPI:  76 yo male long term resident seen today for f/u. He c/o "stomach pain", generalized. No N/V, change in bowel/urinary habits. No f/c. He is a poor historian due to dementia. Hx obtained from chart. He was seen for increased agitation and confusion last month. Psych meds adjusted.  Bilateral lower extremity pain - stable on tylenol 1 gm twice daily and ultram 50 mg twice daily  Dyslipidemia - stable on lipitor 10 mg daily. LDL 67   Dementia -stable on aricept. Current weight down 3lbs (128 lb).   BPH - stable on flomax 0.4 mg daily   Chronic diastoic heart failure - EF 30-35% (10-06-15). S/p ICD placement (interrogated; now pace maker function only). He is not on a diuretic.   Hx PE/DVT - stable on xarelto 20 mg daily   Hx CVA - stable on xarelto 20 mg daily   Depression - mood improved on zoloft 75 mg daily. Sleep improved on trazodone 29m qhs  CAD - stable. No CP. Takes ASA 81 mg daily  Glaucoma - stable on  xalatan to both eyes.   Crohn's disease - has constipation and takes senna daily   COPD - stable on xopenex neb every 6 hours as needed. No recent exacerbations  CKD  - stage 3. Cr 1.19  Seizure d/o - none reported. Takes keppra 500 mg twice daily  DM - diet controlled. A1c  6.4%. Not on ACEI due to frequent hypotension. LDL 67 on statin tx  Anemia of chronic disease - likely 2/2 CKD. Hgb 10.4  Past Medical History:  Diagnosis Date  . AICD (automatic cardioverter/defibrillator) present   . Arthritis    "used to have a touch in my legs"  . Bradycardia 06/01/2014  . CAD (coronary artery disease)    LHC 9/05 with Dr. GEinar Gip  dLM 20-30%, LAD 85%, oD1 20-30%.  PCI:  Taxus DES to LAD; Dx jailed and tx with POBA.  Last myoview 12/10: inf scar, no ischemia, EF 29%.  . CHF (congestive heart failure) ([place]   . Crohn's disease ([place]   . Dementia   . Diabetes mellitus    11/05/11 "borderline; don't take medications"  . Dilated cardiomyopathy ([place]    2/12: EF 30-35%, trivial AI, mild RAE.  EF 2014 40 -45%  . DVT (deep venous thrombosis) ([place]   . HCAP (healthcare-associated pneumonia) 09/29/2015  . HLD (hyperlipidemia)   . 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 Hyperlipidemia   . Hypertension   . Memory difficulties   .  Nephrolithiasis   . PFO (patent foramen ovale)    Not mentioned on 2014 echo.  . Pneumonia ~ 2011   09-22-13 denies any recent SOB or breathing problems  . Pulmonary embolus (HCC)    chronic coumadin  . Stroke [facility]) 1993   "left arm can't hold steady; leg too"  . Swelling of both ankles     09-22-13 occ.feet, but denies pain.    Past Surgical History:  Procedure Laterality Date  . APPENDECTOMY    . COLON SURGERY  1994; 1996   "for Crohn's disease"  . IMPLANTABLE CARDIOVERTER DEFIBRILLATOR IMPLANT N/A 06/02/2014   Procedure: IMPLANTABLE CARDIOVERTER DEFIBRILLATOR IMPLANT;  Surgeon: [name], MD;  Location: [facility] CATH LAB;  Service: Cardiovascular;  Laterality: N/A;     Patient Care Team: Maury Dus, MD as PCP - General (Family Medicine)  Social History   Social History  . Marital status: Married    Spouse name: N/A  . Number of children: N/A  . Years of education: N/A   Occupational History  . Not on file.   Social History Main Topics  . Smoking status: Current Some Day Smoker    Packs/day: 0.50    Years: 53.00    Types: Cigarettes  . Smokeless tobacco: Never Used  . Alcohol use No  . Drug use: No  . Sexual activity: Not Currently   Other Topics Concern  . Not on file   Social History Narrative  . No narrative on file     reports that he has been smoking Cigarettes.  He has a 26.50 pack-year smoking history. He has never used smokeless tobacco. He reports that he does not drink alcohol or use drugs.  Family History  Problem Relation Age of Onset  . Diabetes type II Mother   . CAD Father   . Diabetes type II Brother    Family Status  Relation Status  . Mother Deceased  . Father Deceased  . PGF Deceased  . PGM Deceased  . MGF Deceased  . MGM Deceased  . Brother (Not Specified)    Immunization History  Administered Date(s) Administered  . Influenza Whole 02/09/2010  . Influenza-Unspecified 01/21/2016  . PPD Test 11/05/2011, 10/09/2015  . Pneumococcal Polysaccharide-23 02/19/2010  . Tdap 06/05/2016    Allergies  Allergen Reactions  . Tuberculin Tests Other (See Comments)    Per MAR (no description provided)    Medications: Patient's Medications  New Prescriptions   No medications on file  Previous Medications   ACETAMINOPHEN (TYLENOL) 500 MG TABLET    Take 1,000 mg by mouth 2 (two) times daily.    ASPIRIN 81 MG TABLET    Take 81 mg by mouth daily.   ATORVASTATIN (LIPITOR) 10 MG TABLET    Take 10 mg by mouth at bedtime.   DONEPEZIL (ARICEPT) 10 MG TABLET    Take 10 mg by mouth at bedtime.    FLUTICASONE (FLONASE) 50 MCG/ACT NASAL SPRAY    Place 2 sprays into both nostrils at bedtime.   LATANOPROST  (XALATAN) 0.005 % OPHTHALMIC SOLUTION    Place 1 drop into both eyes at bedtime.   LEVALBUTEROL (XOPENEX) 0.63 MG/3ML NEBULIZER SOLUTION    Take 3 mLs (0.63 mg total) by nebulization every 6 (six) hours as needed for wheezing or shortness of breath.   LEVETIRACETAM (KEPPRA) 500 MG TABLET    Take 1 tablet (500 mg total) by mouth 2 (two) times daily.   NUTRITIONAL SUPPLEMENTS (NUTRITIONAL SUPPLEMENT PO)  Give 237 ml by mouth two times daily for Health Supplement   POLYVINYL ALCOHOL (LIQUIFILM TEARS) 1.4 % OPHTHALMIC SOLUTION    Place 1 drop into both eyes 2 (two) times daily.   RIVAROXABAN (XARELTO) 20 MG TABS TABLET    Take 20 mg by mouth at bedtime.   SENNA (SENOKOT) 8.6 MG TABS TABLET    Take 1 tablet by mouth every morning.   SERTRALINE (ZOLOFT) 50 MG TABLET    Take 50 mg by mouth every morning.    TAMSULOSIN (FLOMAX) 0.4 MG CAPS CAPSULE    Take 0.4 mg by mouth at bedtime.   TRAMADOL (ULTRAM) 50 MG TABLET    Take 50 mg by mouth 2 (two) times daily.   TRAZODONE (DESYREL) 50 MG TABLET    Take 25 mg by mouth at bedtime.  Modified Medications   No medications on file  Discontinued Medications   No medications on file    Review of Systems  Unable to perform ROS: Dementia    Vitals:   09/26/16 1255  BP: 126/78  Pulse: 77  Resp: 16  Temp: 98 F (36.7 C)  SpO2: 99%  Weight: 128 lb 3.2 oz (58.2 kg)  Height: 5' 3"  (1.6 m)   Body mass index is 22.71 kg/m.  Physical Exam  Constitutional: He appears well-developed.  Lying in bed, moaning; looks ill; frail appearing  HENT:  Mouth/Throat: Oropharynx is clear and moist.  MMM; no oral thrush  Eyes: Pupils are equal, round, and reactive to light. No scleral icterus.  Neck: Neck supple. Carotid bruit is not present.  Cardiovascular: Normal rate, regular rhythm and intact distal pulses.   Occasional extrasystoles are present. Exam reveals no gallop and no friction rub.   Murmur (1/6 SEM) heard. No distal LE edema. No calf TTP   Pulmonary/Chest: Effort normal and breath sounds normal. He has no wheezes. He has no rales. He exhibits no tenderness.  Abdominal: Soft. Normal appearance and bowel sounds are normal. He exhibits no distension, no abdominal bruit, no pulsatile midline mass and no mass. There is no hepatomegaly. There is tenderness in the left lower quadrant. There is no rigidity, no rebound and no guarding. No hernia.  Musculoskeletal: He exhibits edema.  Lymphadenopathy:    He has no cervical adenopathy.  Neurological: He is alert.  Skin: Skin is warm and dry. No rash noted.  Psychiatric: He has a normal mood and affect. His behavior is normal.     Labs reviewed: Abstract on 07/31/2016  Component Date Value Ref Range Status  . Hemoglobin 07/31/2016 10.4* 13.5 - 17.5 g/dL Final  . HCT 07/31/2016 37* 41 - 53 % Final  . Neutrophils Absolute 07/31/2016 2  /L Final  . Platelets 07/31/2016 236  150 - 399 K/L Final  . WBC 07/31/2016 5.6  10^3/mL Final  . Glucose 07/31/2016 74  mg/dL Final  . BUN 07/31/2016 22* 4 - 21 mg/dL Final  . Creatinine 07/31/2016 1.2  0.6 - 1.3 mg/dL Final  . Potassium 07/31/2016 4.9  3.4 - 5.3 mmol/L Final  . Sodium 07/31/2016 143  137 - 147 mmol/L Final  . Alkaline Phosphatase 07/31/2016 100  25 - 125 U/L Final  . ALT 07/31/2016 11  10 - 40 U/L Final  . AST 07/31/2016 14  14 - 40 U/L Final  . Bilirubin, Total 07/31/2016 0.6  mg/dL Final  . Hemoglobin A1C 07/31/2016 6.4   Final  Abstract on 07/26/2016  Component Date Value Ref Range Status  .  Hemoglobin 07/15/2016 9.5* 13.5 - 17.5 g/dL Final  . HCT 07/15/2016 33* 41 - 53 % Final  . Neutrophils Absolute 07/15/2016 4  /L Final  . Platelets 07/15/2016 219  150 - 399 K/L Final  . WBC 07/15/2016 6.9  10^3/mL Final  Abstract on 07/24/2016  Component Date Value Ref Range Status  . Glucose 07/15/2016 236  mg/dL Final  . BUN 07/15/2016 21  4 - 21 mg/dL Final  . Creatinine 07/15/2016 1.3  0.6 - 1.3 mg/dL Final  . Potassium  07/15/2016 4.5  3.4 - 5.3 mmol/L Final  . Sodium 07/15/2016 144  137 - 147 mmol/L Final  . Alkaline Phosphatase 07/15/2016 94  25 - 125 U/L Final  . ALT 07/15/2016 14  10 - 40 U/L Final  . AST 07/15/2016 16  14 - 40 U/L Final  . Bilirubin, Total 07/15/2016 0.5  mg/dL Final  Nursing Home on 07/03/2016  Component Date Value Ref Range Status  . Hemoglobin 04/01/2016 9.9* 13.5 - 17.5 g/dL Final  . HCT 04/01/2016 35* 41 - 53 % Final  . Neutrophils Absolute 04/01/2016 3  /L Final  . Platelets 04/01/2016 210  150 - 399 K/L Final  . WBC 04/01/2016 6.3  10^3/mL Final  . Glucose 04/01/2016 86  mg/dL Final  . BUN 04/01/2016 18  4 - 21 mg/dL Final  . Creatinine 04/01/2016 1.2  0.6 - 1.3 mg/dL Final  . Potassium 04/01/2016 4.7  3.4 - 5.3 mmol/L Final  . Sodium 04/01/2016 140  137 - 147 mmol/L Final  . Triglycerides 04/01/2016 119  40 - 160 mg/dL Final  . Cholesterol 04/01/2016 157  0 - 200 mg/dL Final  . HDL 04/01/2016 66  35 - 70 mg/dL Final  . LDL Cholesterol 04/01/2016 67  mg/dL Final  . Alkaline Phosphatase 04/01/2016 103  25 - 125 U/L Final  . ALT 04/01/2016 21  10 - 40 U/L Final  . AST 04/01/2016 19  14 - 40 U/L Final  . Bilirubin, Total 04/01/2016 0.7  mg/dL Final  . Hemoglobin A1C 04/01/2016 6.6   Final  . HM Diabetic Eye Exam 03/25/2016 No Retinopathy  No Retinopathy Final    No results found.   Assessment/Plan   ICD-10-CM   1. Left lower quadrant pain - NEW R10.32    with hx constipation  2. 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 FTT (failure to thrive) in adult R62.7   5. Type 2 diabetes mellitus with stage 3 chronic kidney disease, without long-term current use of insulin (HCC) E11.22    N18.3   6. Seizures ([place]) R56.9   7. History of stroke Z86.73   8. Other chronic pulmonary embolism without acute cor pulmonale (HCC) I27.82     Check flat/upright abdominal xray to r/o obstruction  Cont current meds  as ordered. T/c adjusting bowel regimen  PT/OT as indicated  Will follow  Janasha Barkalow S. Perlie Gold  [facility] and Adult Medicine [address] [place], [place] [address] [phone] Cell (Monday-Friday 8 AM - 5 PM) [phone] After 5 PM and follow prompts

## 2016-10-24 ENCOUNTER — Non-Acute Institutional Stay (SKILLED_NURSING_FACILITY): Payer: Medicare Other | Admitting: Adult Health

## 2016-10-24 ENCOUNTER — Encounter: Payer: Self-pay | Admitting: Adult Health

## 2016-10-24 DIAGNOSIS — N400 Enlarged prostate without lower urinary tract symptoms: Secondary | ICD-10-CM | POA: Diagnosis not present

## 2016-10-24 DIAGNOSIS — F01518 Vascular dementia, unspecified severity, with other behavioral disturbance: Secondary | ICD-10-CM

## 2016-10-24 DIAGNOSIS — I5042 Chronic combined systolic (congestive) and diastolic (congestive) heart failure: Secondary | ICD-10-CM | POA: Diagnosis not present

## 2016-10-24 DIAGNOSIS — E782 Mixed hyperlipidemia: Secondary | ICD-10-CM | POA: Diagnosis not present

## 2016-10-24 DIAGNOSIS — F0151 Vascular dementia with behavioral disturbance: Secondary | ICD-10-CM

## 2016-10-24 NOTE — Progress Notes (Signed)
Location:   [facility] Room Number: 107 B Place of Service:  SNF (31)   CODE STATUS: full code   Allergies  Allergen Reactions  . Tuberculin Tests Other (See Comments)    Per MAR (no description provided)    Chief Complaint  Patient presents with  . Medical Management of Chronic Issues    Routine Visit    HPI:  He is a 76 year old resident of this facility being seen for the management of his chronic illnesses: bilateral lower extremity edema; dyslipidemia; dementia; bph; chronic combined systolic and  diastolic heart failure. He tells me that he has no complaints. His is not having pain; he says  he has a good appetite . He does get out of bed daily.  There are no nursing concerns at this time.    Past Medical History:  Diagnosis Date  . AICD (automatic cardioverter/defibrillator) present   . Arthritis    "used to have a touch in my legs"  . Bradycardia 06/01/2014  . CAD (coronary artery disease)    LHC 9/05 with Dr. Einar Gip:  dLM 20-30%, LAD 85%, oD1 20-30%.  PCI:  Taxus DES to LAD; Dx jailed and tx with POBA.  Last myoview 12/10: inf scar, no ischemia, EF 29%.  . CHF (congestive heart failure) ([place])   . Crohn's disease ([place])   . Dementia   . Diabetes mellitus    11/05/11 "borderline; don't take medications"  . Dilated cardiomyopathy ([place])    2/12: EF 30-35%, trivial AI, mild RAE.  EF 2014 40 -45%  . DVT (deep venous thrombosis) ([place])   . HCAP (healthcare-associated pneumonia) 09/29/2015  . HLD (hyperlipidemia)   . 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 Hyperlipidemia   . Hypertension   . Memory difficulties   . Nephrolithiasis   . PFO (patent foramen ovale)    Not mentioned on 2014 echo.  . Pneumonia ~ 2011   09-22-13 denies any recent SOB or breathing problems  . Pulmonary embolus (HCC)    chronic coumadin  . Stroke [facility]) 1993   "left arm can't hold steady; leg too"  . Swelling of both ankles     09-22-13 occ.feet, but denies pain.    Past Surgical  History:  Procedure Laterality Date  . APPENDECTOMY    . COLON SURGERY  1994; 1996   "for Crohn's disease"  . IMPLANTABLE CARDIOVERTER DEFIBRILLATOR IMPLANT N/A 06/02/2014   Procedure: IMPLANTABLE CARDIOVERTER DEFIBRILLATOR IMPLANT;  Surgeon: [name], MD;  Location: [facility] CATH LAB;  Service: Cardiovascular;  Laterality: N/A;    Social History   Social History  . Marital status: Married    Spouse name: N/A  . Number of children: N/A  . Years of education: N/A   Occupational History  . Not on file.   Social History Main Topics  . Smoking status: Current Some Day Smoker    Packs/day: 0.50    Years: 53.00    Types: Cigarettes  . Smokeless tobacco: Never Used  . Alcohol use No  . Drug use: No  . Sexual activity: Not Currently   Other Topics Concern  . Not on file   Social History Narrative  . No narrative on file   Family History  Problem Relation Age of Onset  . Diabetes type II Mother   . CAD Father   . Diabetes type II Brother       VITAL SIGNS BP 110/62   Pulse 80   Temp 98.1 F (36.7  C) (Oral)   Resp 20   Ht 5' 3"  (1.6 m)   Wt 126 lb 6.4 oz (57.3 kg)   BMI 22.39 kg/m   Patient's Medications  New Prescriptions   No medications on file  Previous Medications   ACETAMINOPHEN (TYLENOL) 500 MG TABLET    Take 1,000 mg by mouth 2 (two) times daily.    ASPIRIN 81 MG TABLET    Take 81 mg by mouth daily.   ATORVASTATIN (LIPITOR) 10 MG TABLET    Take 10 mg by mouth at bedtime.   DONEPEZIL (ARICEPT) 10 MG TABLET    Take 10 mg by mouth at bedtime.    FLUTICASONE (FLONASE) 50 MCG/ACT NASAL SPRAY    Place 2 sprays into both nostrils at bedtime.   LATANOPROST (XALATAN) 0.005 % OPHTHALMIC SOLUTION    Place 1 drop into both eyes at bedtime.   LEVALBUTEROL (XOPENEX) 0.63 MG/3ML NEBULIZER SOLUTION    Take 3 mLs (0.63 mg total) by nebulization every 6 (six) hours as needed for wheezing or shortness of breath.   LEVETIRACETAM (KEPPRA) 500 MG TABLET    Take 1 tablet (500  mg total) by mouth 2 (two) times daily.   NUTRITIONAL SUPPLEMENTS (NUTRITIONAL SUPPLEMENT PO)    Give 237 ml by mouth two times daily for Health Supplement   POLYVINYL ALCOHOL (LIQUIFILM TEARS) 1.4 % OPHTHALMIC SOLUTION    Place 1 drop into both eyes 2 (two) times daily.   RIVAROXABAN (XARELTO) 20 MG TABS TABLET    Take 20 mg by mouth at bedtime.   SENNA (SENOKOT) 8.6 MG TABS TABLET    Take 1 tablet by mouth every morning.   SERTRALINE (ZOLOFT) 50 MG TABLET    Take 50 mg by mouth every morning.    TAMSULOSIN (FLOMAX) 0.4 MG CAPS CAPSULE    Take 0.4 mg by mouth at bedtime.   TRAMADOL (ULTRAM) 50 MG TABLET    Take 50 mg by mouth 2 (two) times daily.   TRAZODONE (DESYREL) 50 MG TABLET    Take 25 mg by mouth at bedtime.  Modified Medications   No medications on file  Discontinued Medications   No medications on file     SIGNIFICANT DIAGNOSTIC EXAMS  PREVIOUS  09-08-15: ct of head and cervical spine: 1. No evidence of acute intracranial or cervical spine injury. 2. Extensive chronic ischemic injury as described, stable from 12/21/2014 comparison.  09-09-15: EEG: Clinical Interpretation: This essentially normal EEG is recorded in the waking and sleep state. There was no seizure or seizure predisposition recorded on this study. Please note that a normal EEG does not preclude the possibility of epilepsy.  09-29-15 ct of head: No acute abnormality. Extensive atrophy and multiple remote infarctions as seen on prior exams  09-30-15: swallow study: Regular solids;Thin liquid   10-05-15: chest x-ray: Retrocardiac density may represent atelectasis versus infiltrate.  10-06-15: right upper quad ultrasound: 1.  Small gallstone.  No biliary distention. 2. Liver is slightly echogenic suggesting fatty infiltration .  10-06-15: 2-d echo:   - Left ventricle: The cavity size was normal. Systolic function was moderately to severely reduced. The estimated ejection fraction was in the range of 30% to 35%. Diffuse  hypokinesis. Doppler parameters are consistent with abnormal left ventricular relaxation (grade 1 diastolic dysfunction). There was no evidence of elevated ventricular filling pressure by Doppler parameters. - Aortic valve: There was mild regurgitation. - Aortic root: The aortic root was normal in size. - Mitral valve: Structurally normal valve. There  was mild regurgitation. - Left atrium: The atrium was mildly dilated. - Right ventricle: Pacer wire or catheter noted in right ventricle. Systolic function was normal. - Right atrium: The atrium was moderately dilated. Pacer wire or catheter noted in right atrium. - Tricuspid valve: There was mild regurgitation. - Pulmonary arteries: Systolic pressure was mildly increased. PA peak pressure: 39 mm Hg (S). - Inferior vena cava: The vessel was dilated. The respirophasic diameter changes were blunted (< 50%), consistent with elevated central venous pressure. - Pericardium, extracardiac: There was no pericardial effusion. Impressions: - When compared to the prior study from 05/31/14 the LVEF has improved, now 30-35% with diffuse hypokinesis.  10-11-15: chest x-ray: right lower lobe atelectasis; no active TB.   11-16-15: left hip and pelvis: no acute osseous abnormalities.   03-26-16: ct of head: 1. Old cerebral and cerebellar infarcts without acute findings. 2. Periventricular white matter and corona radiata hypodensities favor chronic ischemic microvascular white matter disease.   03-27-16: EEG: This awake and drowsy EEG is abnormal due to the presence of: 1. Mild diffuse slowing of the waking background 2. Focal slowing over the left temporal region 3. Rare sharp waves over the left temporal region Clinical Correlation of the above findings indicates diffuse cerebral dysfunction that is non-specific in etiology and can be seen with hypoxic/ischemic injury, toxic/metabolic encephalopathies, neurodegenerative disorders, or medication effect. Focal  slowing over the left temporal region indicates focal cerebral dysfunction in this region suggestive of underlying structural or physiologic abnormality. There is a possible tendency for seizures to arise from this region. There were no electrographic seizures in this study. Clinical correlation is advised.   06-05-16: bilateral hip x-ray: 1. Degenerative changes lumbar spine and both hips. No acute abnormality. 2.  Aortoiliac atherosclerotic vascular disease.   06-05-16: ct of head and cervical spine: 1. No evidence of significant acute traumatic injury to the skull, brain or cervical spine. 2. Mild cerebral atrophy with extensive chronic microvascular ischemic changes, multiple lacunar infarcts in the basal ganglia and right thalamus, and old bilateral (left greater than right) cerebellar infarcts, as above. 3. Multilevel degenerative disc disease and cervical spondylosis, as above.  NO NEW EXAMS     LABS REVIEWED:   PREVIOUS  03-26-16: wbc 11.4 hgb 10.0; hct 34.5; mcv 71.7; plt 216; glucose 136; bun 16; creat 1.45; k+ 4.2; na++ 138; liver normal albumin 3.2 03-28-16: glucose 114; bun 20; creat 1.45; k+ 4.2; na++ 138  04-01-16: wbc 6.3; hgb 9.9; hct 35.4; mcv 75.0; plt 210; glucose 86; bun 17.8; creat 1.15; k+ 4.7; na++ 140; liver normal albumin 3.8; chol 157; ldl 67; trig 119; hdl 66 04-04-16: wbc 10.1; hgb 10.5; hct 37.3; mcv 75.4; plt 229; glucose 199; bun 19.6; creat 1.28; k+ 4.2; na++ 140  04-15-16: hgb a1c 6.9  05-11-16: wbc 9.4; hgb 9.8; hct 33.6; mcv 71.9; plt 198; glucose 190; bun 18; creat 1.44; k+ 4.5; na++ 135  07-31-16: wbc 5.6; hgb 10.4; hct 36.5; mcv 74.3; plt 236; glucose 74; bun 22.1; creat 1.19; k+ 4.9; na++ 143; liver normal albumin 3.8; hgb a1c 6.4   NO NEW LABS      Review of Systems  Constitutional: Negative for malaise/fatigue.  Respiratory: Negative for cough and shortness of breath.   Cardiovascular: Negative for chest pain, palpitations and leg swelling.   Gastrointestinal: Negative for abdominal pain, constipation and heartburn.  Musculoskeletal: Negative for back pain, joint pain and myalgias.  Skin: Negative.   Neurological: Negative for dizziness.  Psychiatric/Behavioral: The  patient is not nervous/anxious.        Physical Exam  Constitutional: No distress.  Thin   Eyes: Conjunctivae are normal.  Neck: Neck supple. No JVD present. No thyromegaly present.  Cardiovascular: Normal rate, regular rhythm and intact distal pulses.   Respiratory: Effort normal and breath sounds normal. No respiratory distress. He has no wheezes.  GI: Soft. Bowel sounds are normal. He exhibits no distension. There is no tenderness.  Musculoskeletal: He exhibits no edema.  Has left hemiparesis   Lymphadenopathy:    He has no cervical adenopathy.  Neurological: He is alert.  Skin: Skin is warm and dry. He is not diaphoretic.  Psychiatric: He has a normal mood and affect.   ASSESSMENT/ PLAN:  TODAY  1.  Bilateral lower extremity pain:  Continue  tylenol  1 gm twice daily and ultram 50 mg twice daily  2. Dyslipidemia: will continue lipitor 10 mg daily ldl is 67   3. Dementia: without significant change; his current weight is 126 pounds is presently stable; will continue aricept 10 mg nightly   4. BPH: will continue flomax 0.4 mg daily   5. Chronic diastoic heart failure: EF 30-35% (10-06-15) is status post ICD placement (interrogated; now pace maker function only)   is currently not on medications; will not make changes will monitor  PREVIOUS   6. PE: is stable will continue xarelto 20 mg daily   7. CVA: is neurologically stable will continue xarelto 20 mg daily   8. Depression: will continue zoloft 50 mg daily   9. CAD: no indications of chest pain present; will continue asa 81 mg daily  10. Glaucoma: will continue xalatan to both eyes.   11. Crohn's disease: has constipation: will continue senna daily   12. COPD: has xopenex neb every 6  hours as needed  will not make changes will monitor his status.   13. CKD stage III: bun/creat 22.1/1.19  14. Hypotension: b/p 124/63  is currently not on medications; will not make changes will monitor   15. Seizures: no reports of recent seizure activities; will continue keppra 500 mg twice daily and will monitor  16. Diabetes: hgb a1c 6.4 currently not on medications; unable to tolerate ace due to low blood pressure         ASSESSMENT/ PLAN:    MD is aware of resident's narcotic use and is in agreement with current plan of care. We will attempt to wean resident as apropriate   Ok Edwards NP [facility]  Contact [phone] Monday through Friday 8am- 5pm  After hours call [phone]

## 2016-11-11 ENCOUNTER — Non-Acute Institutional Stay (SKILLED_NURSING_FACILITY): Payer: Medicare Other

## 2016-11-11 DIAGNOSIS — Z0000 Encounter for general adult medical examination without abnormal findings: Secondary | ICD-10-CM | POA: Diagnosis not present

## 2016-11-11 NOTE — Progress Notes (Signed)
Subjective:   Grant Lara is a 76 y.o. male who presents for an Initial Medicare Annual Wellness Visit at [facility]  Objective:    Today's Vitals   11/11/16 1025  BP: 140/70  Pulse: (!) 59  Temp: 98 F (36.7 C)  TempSrc: Oral  SpO2: 97%  Weight: 126 lb (57.2 kg)  Height: 5' 3"  (1.6 m)   Body mass index is 22.32 kg/m.  Current Medications (verified) Outpatient Encounter Prescriptions as of 11/11/2016  Medication Sig  . acetaminophen (TYLENOL) 500 MG tablet Take 1,000 mg by mouth 2 (two) times daily.   [name] aspirin 81 MG tablet Take 81 mg by mouth daily.  [name] atorvastatin (LIPITOR) 10 MG tablet Take 10 mg by mouth at bedtime.  . donepezil (ARICEPT) 10 MG tablet Take 10 mg by mouth at bedtime.   . fluticasone (FLONASE) 50 MCG/ACT nasal spray Place 2 sprays into both nostrils at bedtime.  [name] latanoprost (XALATAN) 0.005 % ophthalmic solution Place 1 drop into both eyes at bedtime.  . levalbuterol (XOPENEX) 0.63 MG/3ML nebulizer solution Take 3 mLs (0.63 mg total) by nebulization every 6 (six) hours as needed for wheezing or shortness of breath.  . levETIRAcetam (KEPPRA) 500 MG tablet Take 1 tablet (500 mg total) by mouth 2 (two) times daily.  . Nutritional Supplements (NUTRITIONAL SUPPLEMENT PO) Give 237 ml by mouth two times daily for Health Supplement  . polyvinyl alcohol (LIQUIFILM TEARS) 1.4 % ophthalmic solution Place 1 drop into both eyes 2 (two) times daily.  . rivaroxaban (XARELTO) 20 MG TABS tablet Take 20 mg by mouth at bedtime.  . senna (SENOKOT) 8.6 MG TABS tablet Take 1 tablet by mouth every morning.  . sertraline (ZOLOFT) 50 MG tablet Take 50 mg by mouth every morning.   . tamsulosin (FLOMAX) 0.4 MG CAPS capsule Take 0.4 mg by mouth at bedtime.  . traMADol (ULTRAM) 50 MG tablet Take 50 mg by mouth 2 (two) times daily.  . traZODone (DESYREL) 50 MG tablet Take 25 mg by mouth at bedtime.   No facility-administered encounter medications on file as of 11/11/2016.      Allergies (verified) Tuberculin tests   History: Past Medical History:  Diagnosis Date  . AICD (automatic cardioverter/defibrillator) present   . Arthritis    "used to have a touch in my legs"  . Bradycardia 06/01/2014  . CAD (coronary artery disease)    LHC 9/05 with Dr. Einar Gip:  dLM 20-30%, LAD 85%, oD1 20-30%.  PCI:  Taxus DES to LAD; Dx jailed and tx with POBA.  Last myoview 12/10: inf scar, no ischemia, EF 29%.  . CHF (congestive heart failure) ([place])   . Crohn's disease ([place])   . Dementia   . Diabetes mellitus    11/05/11 "borderline; don't take medications"  . Dilated cardiomyopathy ([place])    2/12: EF 30-35%, trivial AI, mild RAE.  EF 2014 40 -45%  . DVT (deep venous thrombosis) ([place])   . HCAP (healthcare-associated pneumonia) 09/29/2015  . HLD (hyperlipidemia)   . 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 Hyperlipidemia   . Hypertension   . Memory difficulties   . Nephrolithiasis   . PFO (patent foramen ovale)    Not mentioned on 2014 echo.  . Pneumonia ~ 2011   09-22-13 denies any recent SOB or breathing problems  . Pulmonary embolus (HCC)    chronic coumadin  . Stroke [facility]) 1993   "left arm can't hold steady; leg too"  . Swelling of  both ankles     09-22-13 occ.feet, but denies pain.   Past Surgical History:  Procedure Laterality Date  . APPENDECTOMY    . COLON SURGERY  1994; 1996   "for Crohn's disease"  . IMPLANTABLE CARDIOVERTER DEFIBRILLATOR IMPLANT N/A 06/02/2014   Procedure: IMPLANTABLE CARDIOVERTER DEFIBRILLATOR IMPLANT;  Surgeon: [name], MD;  Location: [facility] CATH LAB;  Service: Cardiovascular;  Laterality: N/A;   Family History  Problem Relation Age of Onset  . Diabetes type II Mother   . CAD Father   . Diabetes type II Brother    Social History   Occupational History  . Not on file.   Social History Main Topics  . Smoking status: Current Some Day Smoker    Packs/day: 0.50    Years: 53.00    Types: Cigarettes  . Smokeless tobacco: Never Used  .  Alcohol use No  . Drug use: No  . Sexual activity: Not Currently   Tobacco Counseling Ready to quit: Not Answered Counseling given: Not Answered   Activities of Daily Living In your present state of health, do you have any difficulty performing the following activities: 11/11/2016 03/27/2016  Hearing? N N  Vision? N N  Difficulty concentrating or making decisions? Tempie Donning  Walking or climbing stairs? Y Y  Dressing or bathing? Y Y  Doing errands, shopping? Tempie Donning  Preparing Food and eating ? Y -  Using the Toilet? Y -  In the past six months, have you accidently leaked urine? Y -  Do you have problems with loss of bowel control? Y -  Managing your Medications? Y -  Managing your Finances? Y -  Housekeeping or managing your Housekeeping? Y -  Some recent data might be hidden    Immunizations and Health Maintenance Immunization History  Administered Date(s) Administered  . Influenza Whole 02/09/2010  . Influenza-Unspecified 01/21/2016  . PPD Test 11/05/2011, 10/09/2015  . Pneumococcal Polysaccharide-23 02/19/2010  . Tdap 06/05/2016   There are no preventive care reminders to display for this patient.  Patient Care Team: Gerlene Fee, NP as PCP - General (Geriatric Medicine)  Indicate any recent Medical Services you may have received from other than [facility] providers in the past year (date may be approximate).    Assessment:   This is a routine wellness examination for Grant Lara.   Hearing/Vision screen No exam data present  Dietary issues and exercise activities discussed: Current Exercise Habits: The patient does not participate in regular exercise at present, Exercise limited by: neurologic condition(s)  Goals    None     Depression Screen PHQ 2/9 Scores 11/11/2016  PHQ - 2 Score 0    Fall Risk Fall Risk  11/11/2016  Falls in the past year? Yes  Number falls in past yr: 2 or more  Injury with Fall? Yes    Cognitive Function:     6CIT Screen 11/11/2016  What  Year? 4 points  What month? 0 points  What time? 3 points  Count back from 20 4 points  Months in reverse 4 points  Repeat phrase 10 points  Total Score 25    Screening Tests Health Maintenance  Topic Date Due  . URINE MICROALBUMIN  06/04/2017 (Originally 10/16/1950)  . PNA vac Low Risk Adult (2 of 2 - PCV13) 04/16/2023 (Originally 02/20/2011)  . INFLUENZA VACCINE  11/13/2016  . FOOT EXAM  01/18/2017  . HEMOGLOBIN A1C  01/30/2017  . OPHTHALMOLOGY EXAM  03/25/2017  . TETANUS/TDAP  06/05/2026  Plan:    I have personally reviewed and addressed the Medicare Annual Wellness questionnaire and have noted the following in the patient's chart:  A. Medical and social history B. Use of alcohol, tobacco or illicit drugs  C. Current medications and supplements D. Functional ability and status E.  Nutritional status F.  Physical activity G. Advance directives H. List of other physicians I.  Hospitalizations, surgeries, and ER visits in previous 12 months J.  [place] to include hearing, vision, cognitive, depression L. Referrals and appointments - none  In addition, I have reviewed and discussed with patient certain preventive protocols, quality metrics, and best practice recommendations. A written personalized care plan for preventive services as well as general preventive health recommendations were provided to patient.  See attached scanned questionnaire for additional information.   Signed,   Rich Reining, RN Nurse Health Advisor   Quick Notes   Health Maintenance: PNA 13, urine microalbumin to creatine ratio due     Abnormal Screen: 6 CIT-25     Patient Concerns: None     Nurse Concerns: None

## 2016-11-11 NOTE — Patient Instructions (Signed)
Grant Lara , Thank you for taking time to come for your Medicare Wellness Visit. I appreciate your ongoing commitment to your health goals. Please review the following plan we discussed and let me know if I can assist you in the future.   Screening recommendations/referrals: Colonoscopy excluded, pt over age 78 Recommended yearly ophthalmology/optometry visit for glaucoma screening and checkup Recommended yearly dental visit for hygiene and checkup  Vaccinations: Influenza vaccine due 2018 fall season Pneumococcal vaccine 13 due, facility declines Tdap vaccine up to date. Due 06/05/26 Shingles vaccine not in records  Advanced directives: full code in chart, copies of health care power of attorney and living will are needed    Conditions/risks identified: None  Next appointment: Dr. Eulas Post makes rounds  Preventive Care 33 Years and Older, Male Preventive care refers to lifestyle choices and visits with your health care provider that can promote health and wellness. What does preventive care include?  A yearly physical exam. This is also called an annual well check.  Dental exams once or twice a year.  Routine eye exams. Ask your health care provider how often you should have your eyes checked.  Personal lifestyle choices, including:  Daily care of your teeth and gums.  Regular physical activity.  Eating a healthy diet.  Avoiding tobacco and drug use.  Limiting alcohol use.  Practicing safe sex.  Taking low doses of aspirin every day.  Taking vitamin and mineral supplements as recommended by your health care provider. What happens during an annual well check? The services and screenings done by your health care provider during your annual well check will depend on your age, overall health, lifestyle risk factors, and family history of disease. Counseling  Your health care provider may ask you questions about your:  Alcohol use.  Tobacco use.  Drug  use.  Emotional well-being.  Home and relationship well-being.  Sexual activity.  Eating habits.  History of falls.  Memory and ability to understand (cognition).  Work and work Statistician. Screening  You may have the following tests or measurements:  Height, weight, and BMI.  Blood pressure.  Lipid and cholesterol levels. These may be checked every 5 years, or more frequently if you are over 13 years old.  Skin check.  Lung cancer screening. You may have this screening every year starting at age 67 if you have a 30-pack-year history of smoking and currently smoke or have quit within the past 15 years.  Fecal occult blood test (FOBT) of the stool. You may have this test every year starting at age 28.  Flexible sigmoidoscopy or colonoscopy. You may have a sigmoidoscopy every 5 years or a colonoscopy every 10 years starting at age 38.  Prostate cancer screening. Recommendations will vary depending on your family history and other risks.  Hepatitis C blood test.  Hepatitis B blood test.  Sexually transmitted disease (STD) testing.  Diabetes screening. This is done by checking your blood sugar (glucose) after you have not eaten for a while (fasting). You may have this done every 1-3 years.  Abdominal aortic aneurysm (AAA) screening. You may need this if you are a current or former smoker.  Osteoporosis. You may be screened starting at age 68 if you are at high risk. Talk with your health care provider about your test results, treatment options, and if necessary, the need for more tests. Vaccines  Your health care provider may recommend certain vaccines, such as:  Influenza vaccine. This is recommended every year.  Tetanus,  diphtheria, and acellular pertussis (Tdap, Td) vaccine. You may need a Td booster every 10 years.  Zoster vaccine. You may need this after age 37.  Pneumococcal 13-valent conjugate (PCV13) vaccine. One dose is recommended after age  61.  Pneumococcal polysaccharide (PPSV23) vaccine. One dose is recommended after age 79. Talk to your health care provider about which screenings and vaccines you need and how often you need them. This information is not intended to replace advice given to you by your health care provider. Make sure you discuss any questions you have with your health care provider. Document Released: 04/28/2015 Document Revised: 12/20/2015 Document Reviewed: 01/31/2015 Elsevier Interactive Patient Education  2017 [place] Prevention in the Home Falls can cause injuries. They can happen to people of all ages. There are many things you can do to make your home safe and to help prevent falls. What can I do on the outside of my home?  Regularly fix the edges of walkways and driveways and fix any cracks.  Remove anything that might make you trip as you walk through a door, such as a raised step or threshold.  Trim any bushes or trees on the path to your home.  Use bright outdoor lighting.  Clear any walking paths of anything that might make someone trip, such as rocks or tools.  Regularly check to see if handrails are loose or broken. Make sure that both sides of any steps have handrails.  Any raised decks and porches should have guardrails on the edges.  Have any leaves, snow, or ice cleared regularly.  Use sand or salt on walking paths during winter.  Clean up any spills in your garage right away. This includes oil or grease spills. What can I do in the bathroom?  Use night lights.  Install grab bars by the toilet and in the tub and shower. Do not use towel bars as grab bars.  Use non-skid mats or decals in the tub or shower.  If you need to sit down in the shower, use a plastic, non-slip stool.  Keep the floor dry. Clean up any water that spills on the floor as soon as it happens.  Remove soap buildup in the tub or shower regularly.  Attach bath mats securely with double-sided  non-slip rug tape.  Do not have throw rugs and other things on the floor that can make you trip. What can I do in the bedroom?  Use night lights.  Make sure that you have a light by your bed that is easy to reach.  Do not use any sheets or blankets that are too big for your bed. They should not hang down onto the floor.  Have a firm chair that has side arms. You can use this for support while you get dressed.  Do not have throw rugs and other things on the floor that can make you trip. What can I do in the kitchen?  Clean up any spills right away.  Avoid walking on wet floors.  Keep items that you use a lot in easy-to-reach places.  If you need to reach something above you, use a strong step stool that has a grab bar.  Keep electrical cords out of the way.  Do not use floor polish or wax that makes floors slippery. If you must use wax, use non-skid floor wax.  Do not have throw rugs and other things on the floor that can make you trip. What can I do with  my stairs?  Do not leave any items on the stairs.  Make sure that there are handrails on both sides of the stairs and use them. Fix handrails that are broken or loose. Make sure that handrails are as long as the stairways.  Check any carpeting to make sure that it is firmly attached to the stairs. Fix any carpet that is loose or worn.  Avoid having throw rugs at the top or bottom of the stairs. If you do have throw rugs, attach them to the floor with carpet tape.  Make sure that you have a light switch at the top of the stairs and the bottom of the stairs. If you do not have them, ask someone to add them for you. What else can I do to help prevent falls?  Wear shoes that:  Do not have high heels.  Have rubber bottoms.  Are comfortable and fit you well.  Are closed at the toe. Do not wear sandals.  If you use a stepladder:  Make sure that it is fully opened. Do not climb a closed stepladder.  Make sure that both  sides of the stepladder are locked into place.  Ask someone to hold it for you, if possible.  Clearly mark and make sure that you can see:  Any grab bars or handrails.  First and last steps.  Where the edge of each step is.  Use tools that help you move around (mobility aids) if they are needed. These include:  Canes.  Walkers.  Scooters.  Crutches.  Turn on the lights when you go into a dark area. Replace any light bulbs as soon as they burn out.  Set up your furniture so you have a clear path. Avoid moving your furniture around.  If any of your floors are uneven, fix them.  If there are any pets around you, be aware of where they are.  Review your medicines with your doctor. Some medicines can make you feel dizzy. This can increase your chance of falling. Ask your doctor what other things that you can do to help prevent falls. This information is not intended to replace advice given to you by your health care provider. Make sure you discuss any questions you have with your health care provider. Document Released: 01/26/2009 Document Revised: 09/07/2015 Document Reviewed: 05/06/2014 Elsevier Interactive Patient Education  2017 [employer].

## 2016-11-12 LAB — MICROALBUMIN, URINE

## 2016-11-19 ENCOUNTER — Other Ambulatory Visit: Payer: Self-pay

## 2016-11-19 MED ORDER — TRAMADOL HCL 50 MG PO TABS: 50.0000 mg | ORAL_TABLET | Freq: Two times a day (BID) | ORAL | 0 refills | Status: DC

## 2016-11-19 NOTE — Telephone Encounter (Signed)
RX faxed to [facility] @ [phone], phone number [phone]

## 2016-11-20 ENCOUNTER — Non-Acute Institutional Stay (SKILLED_NURSING_FACILITY): Payer: Medicare Other | Admitting: Adult Health

## 2016-11-20 DIAGNOSIS — E785 Hyperlipidemia, unspecified: Secondary | ICD-10-CM

## 2016-11-20 DIAGNOSIS — R634 Abnormal weight loss: Secondary | ICD-10-CM | POA: Diagnosis not present

## 2016-11-20 DIAGNOSIS — F0150 Vascular dementia without behavioral disturbance: Secondary | ICD-10-CM

## 2016-11-21 ENCOUNTER — Encounter: Payer: Self-pay | Admitting: Adult Health

## 2016-11-21 NOTE — Progress Notes (Signed)
Location:   [facility] Room Number: 107 B Place of Service:  SNF (31)   CODE STATUS: Full code  Allergies  Allergen Reactions  . Tuberculin Tests Other (See Comments)    Per MAR (no description provided)    Chief Complaint  Patient presents with  . Acute Visit    Weight Loss    HPI:  He is a long term resident of this facility being seen for weight loss. Her weight in May was 131 pounds; weight in August is 124 pounds. There are no reports of poor appetite. He is poor historian; but does tell me that he feels good.   Past Medical History:  Diagnosis Date  . AICD (automatic cardioverter/defibrillator) present   . Arthritis    "used to have a touch in my legs"  . Bradycardia 06/01/2014  . CAD (coronary artery disease)    LHC 9/05 with Dr. Einar Gip:  dLM 20-30%, LAD 85%, oD1 20-30%.  PCI:  Taxus DES to LAD; Dx jailed and tx with POBA.  Last myoview 12/10: inf scar, no ischemia, EF 29%.  . CHF (congestive heart failure) ([place])   . Crohn's disease ([place])   . Dementia   . Diabetes mellitus    11/05/11 "borderline; don't take medications"  . Dilated cardiomyopathy ([place])    2/12: EF 30-35%, trivial AI, mild RAE.  EF 2014 40 -45%  . DVT (deep venous thrombosis) ([place])   . HCAP (healthcare-associated pneumonia) 09/29/2015  . HLD (hyperlipidemia)   . 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 Hyperlipidemia   . Hypertension   . Memory difficulties   . Nephrolithiasis   . PFO (patent foramen ovale)    Not mentioned on 2014 echo.  . Pneumonia ~ 2011   09-22-13 denies any recent SOB or breathing problems  . Pulmonary embolus ([facility])    chronic coumadin  . Stroke [facility]) 1993   "left arm can't hold steady; leg too"  . Swelling of both ankles     09-22-13 occ.feet, but denies pain.    Past Surgical History:  Procedure Laterality Date  . APPENDECTOMY    . COLON SURGERY  1994; 1996   "for Crohn's disease"  . IMPLANTABLE CARDIOVERTER DEFIBRILLATOR IMPLANT N/A 06/02/2014   Procedure:  IMPLANTABLE CARDIOVERTER DEFIBRILLATOR IMPLANT;  Surgeon: [name], MD;  Location: [facility] CATH LAB;  Service: Cardiovascular;  Laterality: N/A;    Social History   Social History  . Marital status: Married    Spouse name: N/A  . Number of children: N/A  . Years of education: N/A   Occupational History  . Not on file.   Social History Main Topics  . Smoking status: Current Some Day Smoker    Packs/day: 0.50    Years: 53.00    Types: Cigarettes  . Smokeless tobacco: Never Used  . Alcohol use No  . Drug use: No  . Sexual activity: Not Currently   Other Topics Concern  . Not on file   Social History Narrative  . No narrative on file   Family History  Problem Relation Age of Onset  . Diabetes type II Mother   . CAD Father   . Diabetes type II Brother       VITAL SIGNS BP 118/68   Pulse (!) 58   Temp (!) 97 F (36.1 C)   Resp 18   Ht 5' 3"  (1.6 m)   Wt 124 lb 12.8 oz (56.6 kg)   SpO2 97%   BMI 22.11 kg/m  Patient's Medications  New Prescriptions   No medications on file  Previous Medications   ASPIRIN 81 MG TABLET    Take 81 mg by mouth daily.   ATORVASTATIN (LIPITOR) 10 MG TABLET    Take 10 mg by mouth at bedtime.   DONEPEZIL (ARICEPT) 10 MG TABLET    Take 10 mg by mouth at bedtime.    FLUTICASONE (FLONASE) 50 MCG/ACT NASAL SPRAY    Place 2 sprays into both nostrils at bedtime.   LATANOPROST (XALATAN) 0.005 % OPHTHALMIC SOLUTION    Place 1 drop into both eyes at bedtime.   LEVALBUTEROL (XOPENEX) 0.63 MG/3ML NEBULIZER SOLUTION    Take 3 mLs (0.63 mg total) by nebulization every 6 (six) hours as needed for wheezing or shortness of breath.   LEVETIRACETAM (KEPPRA) 500 MG TABLET    Take 1 tablet (500 mg total) by mouth 2 (two) times daily.   NUTRITIONAL SUPPLEMENTS (NUTRITIONAL SUPPLEMENT PO)    Give 237 ml by mouth two times daily for Health Supplement   POLYVINYL ALCOHOL (LIQUIFILM TEARS) 1.4 % OPHTHALMIC SOLUTION    Place 1 drop into both eyes 2 (two) times  daily.   RIVAROXABAN (XARELTO) 20 MG TABS TABLET    Take 20 mg by mouth at bedtime.   SENNA (SENOKOT) 8.6 MG TABS TABLET    Take 1 tablet by mouth every morning.   SERTRALINE (ZOLOFT) 50 MG TABLET    Take 75 mg by mouth every morning.    TAMSULOSIN (FLOMAX) 0.4 MG CAPS CAPSULE    Take 0.4 mg by mouth at bedtime.   TRAMADOL (ULTRAM) 50 MG TABLET    Take 1 tablet (50 mg total) by mouth 2 (two) times daily.   TRAZODONE (DESYREL) 50 MG TABLET    Take 25 mg by mouth at bedtime.  Modified Medications   No medications on file  Discontinued Medications   ACETAMINOPHEN (TYLENOL) 500 MG TABLET    Take 1,000 mg by mouth 2 (two) times daily.      SIGNIFICANT DIAGNOSTIC EXAMS  PREVIOUS  09-09-15: EEG: Clinical Interpretation: This essentially normal EEG is recorded in the waking and sleep state. There was no seizure or seizure predisposition recorded on this study. Please note that a normal EEG does not preclude the possibility of epilepsy.  10-06-15: right upper quad ultrasound: 1.  Small gallstone.  No biliary distention. 2. Liver is slightly echogenic suggesting fatty infiltration .  10-06-15: 2-d echo:   - Left ventricle: The cavity size was normal. Systolic function was moderately to severely reduced. The estimated ejection fraction was in the range of 30% to 35%. Diffuse hypokinesis. Doppler parameters are consistent with abnormal left ventricular relaxation (grade 1 diastolic dysfunction). There was no evidence of elevated ventricular filling pressure by Doppler parameters. - Aortic valve: There was mild regurgitation. - Aortic root: The aortic root was normal in size. - Mitral valve: Structurally normal valve. There was mild regurgitation. - Left atrium: The atrium was mildly dilated. - Right ventricle: Pacer wire or catheter noted in right ventricle. Systolic function was normal. - Right atrium: The atrium was moderately dilated. Pacer wire or catheter noted in right atrium. - Tricuspid valve:  There was mild regurgitation. - Pulmonary arteries: Systolic pressure was mildly increased. PA peak pressure: 39 mm Hg (S). - Inferior vena cava: The vessel was dilated. The respirophasic diameter changes were blunted (< 50%), consistent with elevated central venous pressure. - Pericardium, extracardiac: There was no pericardial effusion. Impressions: - When compared to  the prior study from 05/31/14 the LVEF has improved, now 30-35% with diffuse hypokinesis.   03-27-16: EEG: This awake and drowsy EEG is abnormal due to the presence of: 1. Mild diffuse slowing of the waking background 2. Focal slowing over the left temporal region 3. Rare sharp waves over the left temporal region Clinical Correlation of the above findings indicates diffuse cerebral dysfunction that is non-specific in etiology and can be seen with hypoxic/ischemic injury, toxic/metabolic encephalopathies, neurodegenerative disorders, or medication effect. Focal slowing over the left temporal region indicates focal cerebral dysfunction in this region suggestive of underlying structural or physiologic abnormality. There is a possible tendency for seizures to arise from this region. There were no electrographic seizures in this study. Clinical correlation is advised.   06-05-16: bilateral hip x-ray: 1. Degenerative changes lumbar spine and both hips. No acute abnormality. 2.  Aortoiliac atherosclerotic vascular disease.   06-05-16: ct of head and cervical spine: 1. No evidence of significant acute traumatic injury to the skull, brain or cervical spine. 2. Mild cerebral atrophy with extensive chronic microvascular ischemic changes, multiple lacunar infarcts in the basal ganglia and right thalamus, and old bilateral (left greater than right) cerebellar infarcts, as above. 3. Multilevel degenerative disc disease and cervical spondylosis, as above.  NO NEW EXAMS     LABS REVIEWED:   PREVIOUS  03-26-16: wbc 11.4 hgb 10.0; hct 34.5;  mcv 71.7; plt 216; glucose 136; bun 16; creat 1.45; k+ 4.2; na++ 138; liver normal albumin 3.2 03-28-16: glucose 114; bun 20; creat 1.45; k+ 4.2; na++ 138  04-01-16: wbc 6.3; hgb 9.9; hct 35.4; mcv 75.0; plt 210; glucose 86; bun 17.8; creat 1.15; k+ 4.7; na++ 140; liver normal albumin 3.8; chol 157; ldl 67; trig 119; hdl 66 04-04-16: wbc 10.1; hgb 10.5; hct 37.3; mcv 75.4; plt 229; glucose 199; bun 19.6; creat 1.28; k+ 4.2; na++ 140  04-15-16: hgb a1c 6.9  05-11-16: wbc 9.4; hgb 9.8; hct 33.6; mcv 71.9; plt 198; glucose 190; bun 18; creat 1.44; k+ 4.5; na++ 135  07-31-16: wbc 5.6; hgb 10.4; hct 36.5; mcv 74.3; plt 236; glucose 74; bun 22.1; creat 1.19; k+ 4.9; na++ 143; liver normal albumin 3.8; hgb a1c 6.4   TODAY:  11-12-16: urine micro-albumin <1.2   Review of Systems  Reason unable to perform ROS: poor historian   Constitutional: Negative for malaise/fatigue.  Respiratory: Negative for cough and shortness of breath.   Cardiovascular: Negative for chest pain.  Gastrointestinal: Negative for abdominal pain.  Musculoskeletal: Negative for back pain and myalgias.  Skin: Negative.   Neurological: Negative for dizziness.  Psychiatric/Behavioral: The patient is not nervous/anxious.      Physical Exam  Constitutional: No distress.  thin  Eyes: Conjunctivae are normal.  Neck: Neck supple. No JVD present. No thyromegaly present.  Cardiovascular: Normal rate, regular rhythm and intact distal pulses.   Respiratory: Effort normal and breath sounds normal. No respiratory distress. He has no wheezes.  GI: Soft. Bowel sounds are normal. He exhibits no distension. There is no tenderness.  Musculoskeletal: He exhibits no edema.  Has left hemiparesis    Lymphadenopathy:    He has no cervical adenopathy.  Neurological: He is alert.  Skin: Skin is warm and dry. He is not diaphoretic.  Psychiatric: He has a normal mood and affect.    ASSESSMENT/ PLAN:  TODAY  1. Dyslipidemia: stable ldl 67; will  stop his lipitor due to weight loss and will monitor   2. Dementia: without significant change;  his current weight is 124 pounds is presently stable; will continue aricept 10 mg nightly is he continues to lose weight will need to consider stopping this medication  3. Weight loss: his current weight is 124 pounds; will stop his lipitor at this time      MD is aware of resident's narcotic use and is in agreement with current plan of care. We will attempt to wean resident as apropriate     Ok Edwards NP [facility]  Contact [phone] Monday through Friday 8am- 5pm  After hours call [phone]

## 2016-11-25 ENCOUNTER — Encounter: Payer: Self-pay | Admitting: Adult Health

## 2016-11-25 ENCOUNTER — Non-Acute Institutional Stay (SKILLED_NURSING_FACILITY): Payer: Medicare Other | Admitting: Adult Health

## 2016-11-25 DIAGNOSIS — F329 Major depressive disorder, single episode, unspecified: Secondary | ICD-10-CM | POA: Diagnosis not present

## 2016-11-25 DIAGNOSIS — F0280 Dementia in other diseases classified elsewhere without behavioral disturbance: Secondary | ICD-10-CM | POA: Diagnosis not present

## 2016-11-25 DIAGNOSIS — I2510 Atherosclerotic heart disease of native coronary artery without angina pectoris: Secondary | ICD-10-CM

## 2016-11-25 DIAGNOSIS — J449 Chronic obstructive pulmonary disease, unspecified: Secondary | ICD-10-CM

## 2016-11-25 DIAGNOSIS — F0393 Unspecified dementia, unspecified severity, with mood disturbance: Secondary | ICD-10-CM

## 2016-11-25 DIAGNOSIS — N183 Chronic kidney disease, stage 3 unspecified: Secondary | ICD-10-CM

## 2016-11-25 NOTE — Progress Notes (Signed)
Location:   [place] Room Number: 107 B Place of Service:  SNF (31)   CODE STATUS: Full Code  Allergies  Allergen Reactions  . Tuberculin Tests Other (See Comments)    Per MAR (no description provided)    Chief Complaint  Patient presents with  . Medical Management of Chronic Issues    1 month follow up    HPI:  He is an 76 year old long ter resident of this facility being seen for the management of his chronic illnesses: copd, cad, chronic renal disease; depression. He is unable to fully participate in the hpi or ros. There are no reports of behavioral issues. She has not lost further weight over th past month.  He denies any pain; and told me that he is sleeping at night.    Past Medical History:  Diagnosis Date  . AICD (automatic cardioverter/defibrillator) present   . Arthritis    "used to have a touch in my legs"  . Bradycardia 06/01/2014  . CAD (coronary artery disease)    LHC 9/05 with Dr. Einar Gip:  dLM 20-30%, LAD 85%, oD1 20-30%.  PCI:  Taxus DES to LAD; Dx jailed and tx with POBA.  Last myoview 12/10: inf scar, no ischemia, EF 29%.  . CHF (congestive heart failure) ([place])   . Crohn's disease ([place])   . Dementia   . Diabetes mellitus    11/05/11 "borderline; don't take medications"  . Dilated cardiomyopathy ([place])    2/12: EF 30-35%, trivial AI, mild RAE.  EF 2014 40 -45%  . DVT (deep venous thrombosis) ([place])   . HCAP (healthcare-associated pneumonia) 09/29/2015  . HLD (hyperlipidemia)   . 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 Hyperlipidemia   . Hypertension   . Memory difficulties   . Nephrolithiasis   . PFO (patent foramen ovale)    Not mentioned on 2014 echo.  . Pneumonia ~ 2011   09-22-13 denies any recent SOB or breathing problems  . Pulmonary embolus (HCC)    chronic coumadin  . Stroke [facility]) 1993   "left arm can't hold steady; leg too"  . Swelling of both ankles     09-22-13 occ.feet, but denies pain.    Past Surgical History:  Procedure  Laterality Date  . APPENDECTOMY    . COLON SURGERY  1994; 1996   "for Crohn's disease"  . IMPLANTABLE CARDIOVERTER DEFIBRILLATOR IMPLANT N/A 06/02/2014   Procedure: IMPLANTABLE CARDIOVERTER DEFIBRILLATOR IMPLANT;  Surgeon: [name], MD;  Location: [facility] CATH LAB;  Service: Cardiovascular;  Laterality: N/A;    Social History   Social History  . Marital status: Married    Spouse name: N/A  . Number of children: N/A  . Years of education: N/A   Occupational History  . Not on file.   Social History Main Topics  . Smoking status: Current Some Day Smoker    Packs/day: 0.50    Years: 53.00    Types: Cigarettes  . Smokeless tobacco: Never Used  . Alcohol use No  . Drug use: No  . Sexual activity: Not Currently   Other Topics Concern  . Not on file   Social History Narrative  . No narrative on file   Family History  Problem Relation Age of Onset  . Diabetes type II Mother   . CAD Father   . Diabetes type II Brother       VITAL SIGNS BP 122/78   Pulse (!) 58   Temp 98 F (36.7 C)  Resp 18   Ht 5' 3"  (1.6 m)   Wt 126 lb 12.8 oz (57.5 kg)   SpO2 97%   BMI 22.46 kg/m   Patient's Medications  New Prescriptions   No medications on file  Previous Medications   ASPIRIN 81 MG TABLET    Take 81 mg by mouth daily.   DONEPEZIL (ARICEPT) 10 MG TABLET    Take 10 mg by mouth at bedtime.    FLUTICASONE (FLONASE) 50 MCG/ACT NASAL SPRAY    Place 2 sprays into both nostrils at bedtime.   LATANOPROST (XALATAN) 0.005 % OPHTHALMIC SOLUTION    Place 1 drop into both eyes at bedtime.   LEVALBUTEROL (XOPENEX) 0.63 MG/3ML NEBULIZER SOLUTION    Take 3 mLs (0.63 mg total) by nebulization every 6 (six) hours as needed for wheezing or shortness of breath.   LEVETIRACETAM (KEPPRA) 500 MG TABLET    Take 1 tablet (500 mg total) by mouth 2 (two) times daily.   NUTRITIONAL SUPPLEMENTS (NUTRITIONAL SUPPLEMENT PO)    Med Pass - Give 120 ml by mouth two times daily for Health Supplement    NUTRITIONAL SUPPLEMENTS (NUTRITIONAL SUPPLEMENT PO)    House supplement - House Shake - Give 4 oz shakes at meals   POLYVINYL ALCOHOL (LIQUIFILM TEARS) 1.4 % OPHTHALMIC SOLUTION    Place 1 drop into both eyes 2 (two) times daily.   RIVAROXABAN (XARELTO) 20 MG TABS TABLET    Take 20 mg by mouth at bedtime.   SENNA (SENOKOT) 8.6 MG TABS TABLET    Take 1 tablet by mouth every morning.   SERTRALINE (ZOLOFT) 50 MG TABLET    Take 75 mg by mouth every morning.    TAMSULOSIN (FLOMAX) 0.4 MG CAPS CAPSULE    Take 0.4 mg by mouth at bedtime.   TRAMADOL (ULTRAM) 50 MG TABLET    Take 1 tablet (50 mg total) by mouth 2 (two) times daily.   TRAZODONE (DESYREL) 50 MG TABLET    Take 25 mg by mouth at bedtime.  Modified Medications   No medications on file  Discontinued Medications   ATORVASTATIN (LIPITOR) 10 MG TABLET    Take 10 mg by mouth at bedtime.     SIGNIFICANT DIAGNOSTIC EXAMS  PREVIOUS  10-06-15: 2-d echo:   - Left ventricle: The cavity size was normal. Systolic function was moderately to severely reduced. The estimated ejection fraction was in the range of 30% to 35%. Diffuse hypokinesis. Doppler parameters are consistent with abnormal left ventricular relaxation (grade 1 diastolic dysfunction). There was no evidence of elevated ventricular filling pressure by Doppler parameters. - Aortic valve: There was mild regurgitation. - Aortic root: The aortic root was normal in size. - Mitral valve: Structurally normal valve. There was mild regurgitation. - Left atrium: The atrium was mildly dilated. - Right ventricle: Pacer wire or catheter noted in right ventricle. Systolic function was normal. - Right atrium: The atrium was moderately dilated. Pacer wire or catheter noted in right atrium. - Tricuspid valve: There was mild regurgitation. - Pulmonary arteries: Systolic pressure was mildly increased. PA peak pressure: 39 mm Hg (S). - Inferior vena cava: The vessel was dilated. The respirophasic diameter  changes were blunted (< 50%), consistent with elevated central venous pressure. - Pericardium, extracardiac: There was no pericardial effusion. Impressions: - When compared to the prior study from 05/31/14 the LVEF has improved, now 30-35% with diffuse hypokinesis.   03-27-16: EEG: This awake and drowsy EEG is abnormal due to the presence of:  1. Mild diffuse slowing of the waking background 2. Focal slowing over the left temporal region 3. Rare sharp waves over the left temporal region Clinical Correlation of the above findings indicates diffuse cerebral dysfunction that is non-specific in etiology and can be seen with hypoxic/ischemic injury, toxic/metabolic encephalopathies, neurodegenerative disorders, or medication effect. Focal slowing over the left temporal region indicates focal cerebral dysfunction in this region suggestive of underlying structural or physiologic abnormality. There is a possible tendency for seizures to arise from this region. There were no electrographic seizures in this study. Clinical correlation is advised.   06-05-16: bilateral hip x-ray: 1. Degenerative changes lumbar spine and both hips. No acute abnormality. 2.  Aortoiliac atherosclerotic vascular disease.   06-05-16: ct of head and cervical spine: 1. No evidence of significant acute traumatic injury to the skull, brain or cervical spine. 2. Mild cerebral atrophy with extensive chronic microvascular ischemic changes, multiple lacunar infarcts in the basal ganglia and right thalamus, and old bilateral (left greater than right) cerebellar infarcts, as above. 3. Multilevel degenerative disc disease and cervical spondylosis, as above.  NO NEW EXAMS     LABS REVIEWED: PREVIOUS:  03-26-16: wbc 11.4 hgb 10.0; hct 34.5; mcv 71.7; plt 216; glucose 136; bun 16; creat 1.45; k+ 4.2; na++ 138; liver normal albumin 3.2 03-28-16: glucose 114; bun 20; creat 1.45; k+ 4.2; na++ 138  04-01-16: wbc 6.3; hgb 9.9; hct 35.4; mcv  75.0; plt 210; glucose 86; bun 17.8; creat 1.15; k+ 4.7; na++ 140; liver normal albumin 3.8; chol 157; ldl 67; trig 119; hdl 66 04-04-16: wbc 10.1; hgb 10.5; hct 37.3; mcv 75.4; plt 229; glucose 199; bun 19.6; creat 1.28; k+ 4.2; na++ 140  04-15-16: hgb a1c 6.9  05-11-16: wbc 9.4; hgb 9.8; hct 33.6; mcv 71.9; plt 198; glucose 190; bun 18; creat 1.44; k+ 4.5; na++ 135  07-31-16: wbc 5.6; hgb 10.4; hct 36.5; mcv 74.3; plt 236; glucose 74; bun 22.1; creat 1.19; k+ 4.9; na++ 143; liver normal albumin 3.8; hgb a1c 6.4  11-12-16: urine micro-albumin <1.2  NO NEW LABS   Review of Systems  Reason unable to perform ROS: He is a poor historian   Constitutional: Negative for malaise/fatigue.  Respiratory: Negative for cough.   Cardiovascular: Negative for chest pain.  Gastrointestinal: Negative for abdominal pain.  Musculoskeletal: Negative for back pain.  Skin: Negative.   Neurological: Negative for dizziness.  Psychiatric/Behavioral: The patient is not nervous/anxious.     Physical Exam  Constitutional: No distress.  Thin   Eyes: Conjunctivae are normal.  Neck: Neck supple. No JVD present. No thyromegaly present.  Cardiovascular: Normal rate, regular rhythm and intact distal pulses.   Respiratory: Effort normal and breath sounds normal. No respiratory distress. He has no wheezes.  GI: Soft. Bowel sounds are normal. He exhibits no distension. There is no tenderness.  Musculoskeletal: He exhibits no edema.  Left hemiparesis   Lymphadenopathy:    He has no cervical adenopathy.  Neurological: He is alert.  Skin: Skin is warm and dry. He is not diaphoretic.  Psychiatric: He has a normal mood and affect.    ASSESSMENT/ PLAN:  TODAY  1. CAD: no indications of chest pain present; will continue asa 81 mg daily  2. COPD: has xopenex neb every 6 hours as needed  will not make changes will monitor his status.   3.Depression: will continue zoloft 50 mg daily   4. CKD stage III: bun/creat  22.1/1.19  PREVIOUS:   5. PE: is stable  will continue xarelto 20 mg daily   6. CVA: is neurologically stable will continue xarelto 20 mg daily   7. Glaucoma: stable  will continue xalatan to both eyes.   8. Crohn's disease: has constipation: stable  will continue senna daily   9. Hypotension: b/p 124/63 stable  is currently not on medications; will not make changes will monitor   11. Seizures: stable no reports of recent seizure activities; will continue keppra 500 mg twice daily and will monitor  12. Diabetes: stable  hgb a1c 6.4 currently not on medications; unable to tolerate ace due to low blood pressure   13.  Bilateral lower extremity pain: stable  Continue  tylenol  1 gm twice daily and ultram 50 mg twice daily  14.  Dyslipidemia: ldl is 67 is stable his lipitor was stopped due to weight loss.   15. Dementia: without significant change; his current weight is 124 pounds previous weight 124 pounds is presently stable; will continue aricept 10 mg nightly   16  BPH: stable  will continue flomax 0.4 mg daily   17. C hronic diastoic heart failure: EF 30-35% (10-06-15) is status post ICD placement (interrogated; now pace maker function only) is stable   is currently not on medications; will not make changes will monitor    MD is aware of resident's narcotic use and is in agreement with current plan of care. We will attempt to wean resident as apropriate   Ok Edwards NP [facility]  Contact [phone] Monday through Friday 8am- 5pm  After hours call [phone]

## 2016-12-05 DIAGNOSIS — R634 Abnormal weight loss: Secondary | ICD-10-CM | POA: Insufficient documentation

## 2016-12-05 DIAGNOSIS — E785 Hyperlipidemia, unspecified: Secondary | ICD-10-CM | POA: Insufficient documentation

## 2016-12-20 ENCOUNTER — Other Ambulatory Visit: Payer: Self-pay

## 2016-12-20 MED ORDER — TRAMADOL HCL 50 MG PO TABS: 50.0000 mg | ORAL_TABLET | Freq: Two times a day (BID) | ORAL | 0 refills | Status: DC

## 2016-12-20 NOTE — Telephone Encounter (Signed)
RX faxed to [facility] @ [phone], phone number [phone]

## 2016-12-20 NOTE — Telephone Encounter (Signed)
RX faxed to [facility] @ [phone], phone number [phone]

## 2016-12-23 ENCOUNTER — Non-Acute Institutional Stay (SKILLED_NURSING_FACILITY): Payer: Medicare Other | Admitting: Internal Medicine

## 2016-12-23 ENCOUNTER — Encounter: Payer: Self-pay | Admitting: Internal Medicine

## 2016-12-23 DIAGNOSIS — F0150 Vascular dementia without behavioral disturbance: Secondary | ICD-10-CM

## 2016-12-23 DIAGNOSIS — I5042 Chronic combined systolic (congestive) and diastolic (congestive) heart failure: Secondary | ICD-10-CM

## 2016-12-23 DIAGNOSIS — R569 Unspecified convulsions: Secondary | ICD-10-CM | POA: Diagnosis not present

## 2016-12-23 DIAGNOSIS — Z8673 Personal history of transient ischemic attack (TIA), and cerebral infarction without residual deficits: Secondary | ICD-10-CM | POA: Diagnosis not present

## 2016-12-23 DIAGNOSIS — I2782 Chronic pulmonary embolism: Secondary | ICD-10-CM | POA: Diagnosis not present

## 2016-12-23 DIAGNOSIS — E782 Mixed hyperlipidemia: Secondary | ICD-10-CM | POA: Diagnosis not present

## 2016-12-23 DIAGNOSIS — E1140 Type 2 diabetes mellitus with diabetic neuropathy, unspecified: Secondary | ICD-10-CM

## 2016-12-23 DIAGNOSIS — Z79899 Other long term (current) drug therapy: Secondary | ICD-10-CM

## 2016-12-23 NOTE — Progress Notes (Signed)
Patient ID: Grant Lara, male   DOB: Jan 23, 1941, 76 y.o.   MRN: [id number]     DATE:  December 23, 2016  Location:   [place] Room Number: 14 B Place of Service: SNF (31)   Extended Emergency Contact Information Primary Emergency Contact: Hayashida,[name] Address: [address]          [place], [place] [address] [name] of [place] Phone: [phone] Mobile Phone: [phone] Relation: Spouse Secondary Emergency Contact: [name],[name]          [place], [place] of [place] Mobile Phone: [phone] Relation: Daughter  Advanced Directive information Does Patient Have a Medical Advance Directive?: Yes, Type of Advance Directive: Out of facility DNR (pink MOST or yellow form), Pre-existing out of facility DNR order (yellow form or pink MOST form): Pink MOST form placed in chart (order not valid for inpatient use), Does patient want to make changes to medical advance directive?: No - Patient declined  Chief Complaint  Patient presents with  . Medical Management of Chronic Issues    1 month follow up    HPI:  76 yo male long term resident seen today for f/u. He c/o numbness and burning in feet. His tongue feels numb also. No CP or SOB. Appetite ok. Sleeps well. No abdominal pain or constipation. No low BS reactions. He is a poor historian due to dementia. Hx obtained from chart.  CAD - no CP or SOB. He takes ASA 81 mg daily and xeralto 41m  daily. LDL 67 without statin tx  COPD - stable. No recent exacerbations. No longer on xopenex nebs.   Depression - mood stable on zoloft 75 mg daily   CKD - stage 3. Cr 1.2  Anemia of chronic disease - stable. Hgb 10.4  Hx chronic PE - stable on xarelto 20 mg daily. No bleeding  Hx CVA - stable on ASA daily and xarelto 20 mg daily   Glaucoma - stable on xalatan to both eyes. He uses artificial tears for dry eye  Crohn's disease - no recent exacerbations. He has constipation and takes senna daily   Hx  Hypotension - stable BP.  Seizure d/o - stable on keppra 500 mg twice daily. Na 143  DM - diet controlled. A1c 6.4%. Unable to tolerate ACEI/ARB due to hypotension. Urine microalbumin/Cr ratio <4.6  Bilateral lower extremity pain - stable on ultram 566mBID  Dyslipidemia - diet controlled. lipitor was stopped due to weight loss. LDL 67  Dementia/insomnia - cognition stable on aricept 10 mg nightly. He takes trazodone to help sleep   BPH - stable on flomax 0.4 mg daily   chronic diastoic heart failure - EF 30-35% (10-06-15); s/p ICD placement (interrogated; now pacemaker function only). Followed by cardio  Past Medical History:  Diagnosis Date  . AICD (automatic cardioverter/defibrillator) present   . Arthritis    "used to have a touch in my legs"  . Bradycardia 06/01/2014  . CAD (coronary artery disease)    LHC 9/05 with Dr. GaEinar Gip dLM 20-30%, LAD 85%, oD1 20-30%.  PCI:  Taxus DES to LAD; Dx jailed and tx with POBA.  Last myoview 12/10: inf scar, no ischemia, EF 29%.  . CH[place]ive heart failure) ([place]  . Crohn's disease ([place]  . Dementia   . Diabetes mellitus    11/05/11 "borderline; don't take medications"  . [place]ilated [place] ([place]   2/12: EF 30-35%, trivial AI, mild RAE.  EF 2014 40 -45%  .  DVT (deep venous thrombosis) ([place])   . HCAP (healthcare-associated pneumonia) 09/29/2015  . HLD (hyperlipidemia)   . 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 Hyperlipidemia   . Hypertension   . Memory difficulties   . Nephrolithiasis   . PFO (patent foramen ovale)    Not mentioned on 2014 echo.  . Pneumonia ~ 2011   09-22-13 denies any recent SOB or breathing problems  . Pulmonary embolus (HCC)    chronic coumadin  . Stroke [facility]) 1993   "left arm can't hold steady; leg too"  . Swelling of both ankles     09-22-13 occ.feet, but denies pain.    Past Surgical History:  Procedure Laterality Date  . APPENDECTOMY    . COLON SURGERY  1994; 1996   "for Crohn's disease"  .  IMPLANTABLE CARDIOVERTER DEFIBRILLATOR IMPLANT N/A 06/02/2014   Procedure: IMPLANTABLE CARDIOVERTER DEFIBRILLATOR IMPLANT;  Surgeon: [name], MD;  Location: [facility] CATH LAB;  Service: Cardiovascular;  Laterality: N/A;    Patient Care Team: Gerlene Fee, NP as PCP - General (Geriatric Medicine)  Social History   Social History  . Marital status: Married    Spouse name: N/A  . Number of children: N/A  . Years of education: N/A   Occupational History  . Not on file.   Social History Main Topics  . Smoking status: Current Some Day Smoker    Packs/day: 0.50    Years: 53.00    Types: Cigarettes  . Smokeless tobacco: Never Used  . Alcohol use No  . Drug use: No  . Sexual activity: Not Currently   Other Topics Concern  . Not on file   Social History Narrative  . No narrative on file     reports that he has been smoking Cigarettes.  He has a 26.50 pack-year smoking history. He has never used smokeless tobacco. He reports that he does not drink alcohol or use drugs.  Family History  Problem Relation Age of Onset  . Diabetes type II Mother   . CAD Father   . Diabetes type II Brother    Family Status  Relation Status  . Mother Deceased  . Father Deceased  . PGF Deceased  . PGM Deceased  . MGF Deceased  . MGM Deceased  . Brother (Not Specified)    Immunization History  Administered Date(s) Administered  . Influenza Whole 02/09/2010  . Influenza-Unspecified 01/21/2016  . PPD Test 11/05/2011, 10/09/2015  . Pneumococcal Polysaccharide-23 02/19/2010  . Tdap 06/05/2016    Allergies  Allergen Reactions  . Tuberculin Tests Other (See Comments)    Per MAR (no description provided)    Medications: Patient's Medications  New Prescriptions   No medications on file  Previous Medications   ASPIRIN 81 MG TABLET    Take 81 mg by mouth daily.   DONEPEZIL (ARICEPT) 10 MG TABLET    Take 10 mg by mouth at bedtime.    LATANOPROST (XALATAN) 0.005 % OPHTHALMIC SOLUTION     Place 1 drop into both eyes at bedtime.   LEVETIRACETAM (KEPPRA) 500 MG TABLET    Take 1 tablet (500 mg total) by mouth 2 (two) times daily.   NUTRITIONAL SUPPLEMENTS (NUTRITIONAL SUPPLEMENT PO)    Med Pass - Give 120 ml by mouth two times daily for Health Supplement   NUTRITIONAL SUPPLEMENTS (NUTRITIONAL SUPPLEMENT PO)    House supplement - House Shake - Give 4 oz shakes at meals   POLYVINYL ALCOHOL (LIQUIFILM TEARS) 1.4 % OPHTHALMIC SOLUTION  Place 1 drop into both eyes 2 (two) times daily.   RIVAROXABAN (XARELTO) 20 MG TABS TABLET    Take 20 mg by mouth at bedtime.   SENNA (SENOKOT) 8.6 MG TABS TABLET    Take 1 tablet by mouth every morning.   SERTRALINE (ZOLOFT) 50 MG TABLET    Take 75 mg by mouth every morning.    TAMSULOSIN (FLOMAX) 0.4 MG CAPS CAPSULE    Take 0.4 mg by mouth at bedtime.   TRAMADOL (ULTRAM) 50 MG TABLET    Take 1 tablet (50 mg total) by mouth 2 (two) times daily.   TRAZODONE (DESYREL) 50 MG TABLET    Take 25 mg by mouth at bedtime.  Modified Medications   No medications on file  Discontinued Medications   FLUTICASONE (FLONASE) 50 MCG/ACT NASAL SPRAY    Place 2 sprays into both nostrils at bedtime.   LEVALBUTEROL (XOPENEX) 0.63 MG/3ML NEBULIZER SOLUTION    Take 3 mLs (0.63 mg total) by nebulization every 6 (six) hours as needed for wheezing or shortness of breath.    Review of Systems  Unable to perform ROS: Dementia    Vitals:   12/23/16 0921  Weight: 126 lb 12.8 oz (57.5 kg)  Height: 5' 3"  (1.6 m)   Body mass index is 22.46 kg/m.  Physical Exam  Constitutional: He appears well-developed.  Frail appearing, sitting up in bed in NAD  HENT:  Mouth/Throat: Oropharynx is clear and moist.  MMM; no oral thrush  Eyes: Pupils are equal, round, and reactive to light. No scleral icterus.  Neck: Neck supple. Carotid bruit is not present. No thyromegaly present.  Cardiovascular: Normal rate, regular rhythm and intact distal pulses.  Exam reveals no gallop and no  friction rub.   Murmur (1/6 SEM) heard. No distal LE edema. No calf TTP  Pulmonary/Chest: Effort normal. He has no wheezes. He has rales (left basilar expiratory). He exhibits no tenderness.  Abdominal: Soft. Normal appearance and bowel sounds are normal. He exhibits no distension, no abdominal bruit, no pulsatile midline mass and no mass. There is no hepatomegaly. There is no tenderness. There is no rigidity, no rebound and no guarding. No hernia.  Musculoskeletal: He exhibits edema.  Lymphadenopathy:    He has no cervical adenopathy.  Neurological: He is alert.  Skin: Skin is warm and dry. No rash noted.  Psychiatric: He has a normal mood and affect. His behavior is normal. Thought content normal.   Diabetic Foot Exam - Simple   Simple Foot Form Diabetic Foot exam was performed with the following findings:  Yes 12/23/2016  3:26 PM  Visual Inspection See comments:  Yes Sensation Testing Pulse Check Posterior Tibialis and Dorsalis pulse intact bilaterally:  Yes Comments B/l toenail hypertrophy with dystrophic changes       Labs reviewed: Abstract on 11/15/2016  Component Date Value Ref Range Status  . Microalb, Ur 11/12/2016 <1.2   Final    No results found.   Assessment/Plan   ICD-10-CM   1. Type 2 diabetes mellitus with diabetic neuropathy, without long-term current use of insulin (HCC) E11.40   2. High risk medication use Z79.899   3. Vascular dementia without behavioral disturbance F01.50   4. Seizures ([place]) R56.9   5. History of stroke Z86.73   6. Mixed hyperlipidemia E78.2   7. Chronic combined systolic and diastolic CHF (congestive heart failure) (HCC) I50.42   8. Other chronic pulmonary embolism without acute cor pulmonale (HCC) I27.82     Check  cmp, cbc, A1c, lipid panel  Start gabapentin 141m po qhs for neuropathy  cont other meds as ordered  Seizure precautions  Pt/OT as indicated  F/u with specialists as indicated  Will follow   Jesica Goheen S.  CPerlie Gold [facility] [address] [id number]([phone] (Monday-Friday 8 AM - 5 PM) [phone] 5 PM and follow prompts

## 2016-12-24 LAB — CBC AND DIFFERENTIAL
HEMATOCRIT: 32 — AB (ref 41–53)
Hemoglobin: 9.6 — AB (ref 13.5–17.5)
NEUTROS ABS: 3
Platelets: 239 (ref 150–399)
WBC: 6

## 2016-12-24 LAB — BASIC METABOLIC PANEL
BUN: 14 (ref 4–21)
Creatinine: 1.1 (ref 0.6–1.3)
GLUCOSE: 74
Potassium: 4.7 (ref 3.4–5.3)
Sodium: 141 (ref 137–147)

## 2016-12-24 LAB — HEPATIC FUNCTION PANEL
ALT: 8 — AB (ref 10–40)
AST: 12 — AB (ref 14–40)
Alkaline Phosphatase: 95 (ref 25–125)
BILIRUBIN, TOTAL: 0.5

## 2017-01-09 ENCOUNTER — Non-Acute Institutional Stay (SKILLED_NURSING_FACILITY): Payer: Medicare Other | Admitting: Adult Health

## 2017-01-09 ENCOUNTER — Encounter: Payer: Self-pay | Admitting: Adult Health

## 2017-01-09 DIAGNOSIS — E1142 Type 2 diabetes mellitus with diabetic polyneuropathy: Secondary | ICD-10-CM | POA: Diagnosis not present

## 2017-01-09 DIAGNOSIS — G8929 Other chronic pain: Secondary | ICD-10-CM

## 2017-01-09 DIAGNOSIS — M5441 Lumbago with sciatica, right side: Secondary | ICD-10-CM

## 2017-01-09 NOTE — Progress Notes (Signed)
Location:   [facility] Room Number: 107 B Place of Service:  SNF (31)   CODE STATUS: Full code  Allergies  Allergen Reactions  . Tuberculin Tests Other (See Comments)    Per MAR (no description provided)    Chief Complaint  Patient presents with  . Acute Visit    Back Pain    HPI:  Staff reports that for the past several days he has been having back pain. The pain is interfering with his ability to get out of bed. He is a poor historian. He did tell me that he is in pain and his legs hurt.  He is taking ultram twice daily without full relief of pain.   Past Medical History:  Diagnosis Date  . AICD (automatic cardioverter/defibrillator) present   . Arthritis    "used to have a touch in my legs"  . Bradycardia 06/01/2014  . CAD (coronary artery disease)    LHC 9/05 with Dr. Einar Gip:  dLM 20-30%, LAD 85%, oD1 20-30%.  PCI:  Taxus DES to LAD; Dx jailed and tx with POBA.  Last myoview 12/10: inf scar, no ischemia, EF 29%.  . CHF (congestive heart failure) ([place])   . Crohn's disease ([place])   . Dementia   . Diabetes mellitus    11/05/11 "borderline; don't take medications"  . Dilated cardiomyopathy ([place])    2/12: EF 30-35%, trivial AI, mild RAE.  EF 2014 40 -45%  . DVT (deep venous thrombosis) ([place])   . HCAP (healthcare-associated pneumonia) 09/29/2015  . HLD (hyperlipidemia)   . 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 Hyperlipidemia   . Hypertension   . Memory difficulties   . Nephrolithiasis   . PFO (patent foramen ovale)    Not mentioned on 2014 echo.  . Pneumonia ~ 2011   09-22-13 denies any recent SOB or breathing problems  . Pulmonary embolus (HCC)    chronic coumadin  . Stroke [facility]) 1993   "left arm can't hold steady; leg too"  . Swelling of both ankles     09-22-13 occ.feet, but denies pain.    Past Surgical History:  Procedure Laterality Date  . APPENDECTOMY    . COLON SURGERY  1994; 1996   "for Crohn's disease"  . IMPLANTABLE CARDIOVERTER DEFIBRILLATOR  IMPLANT N/A 06/02/2014   Procedure: IMPLANTABLE CARDIOVERTER DEFIBRILLATOR IMPLANT;  Surgeon: [name], MD;  Location: [facility] CATH LAB;  Service: Cardiovascular;  Laterality: N/A;    Social History   Social History  . Marital status: Married    Spouse name: N/A  . Number of children: N/A  . Years of education: N/A   Occupational History  . Not on file.   Social History Main Topics  . Smoking status: Current Some Day Smoker    Packs/day: 0.50    Years: 53.00    Types: Cigarettes  . Smokeless tobacco: Never Used  . Alcohol use No  . Drug use: No  . Sexual activity: Not Currently   Other Topics Concern  . Not on file   Social History Narrative  . No narrative on file   Family History  Problem Relation Age of Onset  . Diabetes type II Mother   . CAD Father   . Diabetes type II Brother       VITAL SIGNS BP 128/70   Pulse 88   Temp 98.7 F (37.1 C)   Resp 18   Ht 5' 3"  (1.6 m)   Wt 124 lb 9.6 oz (56.5 kg)  SpO2 97%   BMI 22.07 kg/m   Patient's Medications  New Prescriptions   No medications on file  Previous Medications   ASPIRIN 81 MG TABLET    Take 81 mg by mouth daily.   DONEPEZIL (ARICEPT) 10 MG TABLET    Take 10 mg by mouth at bedtime.    GABAPENTIN (NEURONTIN) 100 MG CAPSULE    Take 100 mg by mouth at bedtime.   LATANOPROST (XALATAN) 0.005 % OPHTHALMIC SOLUTION    Place 1 drop into both eyes at bedtime.   LEVETIRACETAM (KEPPRA) 500 MG TABLET    Take 1 tablet (500 mg total) by mouth 2 (two) times daily.   NUTRITIONAL SUPPLEMENTS (NUTRITIONAL SUPPLEMENT PO)    Med Pass - Give 120 ml by mouth two times daily for Health Supplement   NUTRITIONAL SUPPLEMENTS (NUTRITIONAL SUPPLEMENT PO)    House supplement - House Shake - Give 4 oz shakes at meals   POLYVINYL ALCOHOL (LIQUIFILM TEARS) 1.4 % OPHTHALMIC SOLUTION    Place 1 drop into both eyes 2 (two) times daily.   RIVAROXABAN (XARELTO) 20 MG TABS TABLET    Take 20 mg by mouth at bedtime.   SENNA (SENOKOT)  8.6 MG TABS TABLET    Take 1 tablet by mouth every morning.   SERTRALINE (ZOLOFT) 50 MG TABLET    Take 75 mg by mouth every morning.    TAMSULOSIN (FLOMAX) 0.4 MG CAPS CAPSULE    Take 0.4 mg by mouth at bedtime.   TRAMADOL (ULTRAM) 50 MG TABLET    Take 1 tablet (50 mg total) by mouth 2 (two) times daily.   TRAZODONE (DESYREL) 50 MG TABLET    Take 25 mg by mouth at bedtime.  Modified Medications   No medications on file  Discontinued Medications   No medications on file     SIGNIFICANT DIAGNOSTIC EXAMS  PREVIOUS  10-06-15: 2-d echo:   - Left ventricle: The cavity size was normal. Systolic function was moderately to severely reduced. The estimated ejection fraction was in the range of 30% to 35%. Diffuse hypokinesis. Doppler parameters are consistent with abnormal left ventricular relaxation (grade 1 diastolic dysfunction). There was no evidence of elevated ventricular filling pressure by Doppler parameters. - Aortic valve: There was mild regurgitation. - Aortic root: The aortic root was normal in size. - Mitral valve: Structurally normal valve. There was mild regurgitation. - Left atrium: The atrium was mildly dilated. - Right ventricle: Pacer wire or catheter noted in right ventricle. Systolic function was normal. - Right atrium: The atrium was moderately dilated. Pacer wire or catheter noted in right atrium. - Tricuspid valve: There was mild regurgitation. - Pulmonary arteries: Systolic pressure was mildly increased. PA peak pressure: 39 mm Hg (S). - Inferior vena cava: The vessel was dilated. The respirophasic diameter changes were blunted (< 50%), consistent with elevated central venous pressure. - Pericardium, extracardiac: There was no pericardial effusion. Impressions: - When compared to the prior study from 05/31/14 the LVEF has improved, now 30-35% with diffuse hypokinesis.   03-27-16: EEG: This awake and drowsy EEG is abnormal due to the presence of: 1. Mild diffuse slowing of  the waking background 2. Focal slowing over the left temporal region 3. Rare sharp waves over the left temporal region Clinical Correlation of the above findings indicates diffuse cerebral dysfunction that is non-specific in etiology and can be seen with hypoxic/ischemic injury, toxic/metabolic encephalopathies, neurodegenerative disorders, or medication effect. Focal slowing over the left temporal region indicates focal cerebral  dysfunction in this region suggestive of underlying structural or physiologic abnormality. There is a possible tendency for seizures to arise from this region. There were no electrographic seizures in this study. Clinical correlation is advised.   06-05-16: bilateral hip x-ray: 1. Degenerative changes lumbar spine and both hips. No acute abnormality. 2.  Aortoiliac atherosclerotic vascular disease.   06-05-16: ct of head and cervical spine: 1. No evidence of significant acute traumatic injury to the skull, brain or cervical spine. 2. Mild cerebral atrophy with extensive chronic microvascular ischemic changes, multiple lacunar infarcts in the basal ganglia and right thalamus, and old bilateral (left greater than right) cerebellar infarcts, as above. 3. Multilevel degenerative disc disease and cervical spondylosis, as above.  NO NEW EXAMS     LABS REVIEWED: PREVIOUS:  03-26-16: wbc 11.4 hgb 10.0; hct 34.5; mcv 71.7; plt 216; glucose 136; bun 16; creat 1.45; k+ 4.2; na++ 138; liver normal albumin 3.2 03-28-16: glucose 114; bun 20; creat 1.45; k+ 4.2; na++ 138  04-01-16: wbc 6.3; hgb 9.9; hct 35.4; mcv 75.0; plt 210; glucose 86; bun 17.8; creat 1.15; k+ 4.7; na++ 140; liver normal albumin 3.8; chol 157; ldl 67; trig 119; hdl 66 04-04-16: wbc 10.1; hgb 10.5; hct 37.3; mcv 75.4; plt 229; glucose 199; bun 19.6; creat 1.28; k+ 4.2; na++ 140  04-15-16: hgb a1c 6.9  05-11-16: wbc 9.4; hgb 9.8; hct 33.6; mcv 71.9; plt 198; glucose 190; bun 18; creat 1.44; k+ 4.5; na++ 135  07-31-16:  wbc 5.6; hgb 10.4; hct 36.5; mcv 74.3; plt 236; glucose 74; bun 22.1; creat 1.19; k+ 4.9; na++ 143; liver normal albumin 3.8; hgb a1c 6.4  11-12-16: urine micro-albumin <1.2  NO NEW LABS   Review of Systems  Reason unable to perform ROS: poor historian   Constitutional: Negative for malaise/fatigue.  Respiratory: Negative for cough.   Cardiovascular: Negative for chest pain.  Gastrointestinal: Negative for abdominal pain.  Musculoskeletal: Positive for back pain and myalgias.       Has leg pain   Skin: Negative.   Psychiatric/Behavioral: The patient is not nervous/anxious.     Physical Exam  Constitutional: No distress.  thin  Eyes: Conjunctivae are normal.  Neck: Neck supple. No thyromegaly present.  Cardiovascular: Normal rate, regular rhythm, normal heart sounds and intact distal pulses.   Pulmonary/Chest: Effort normal and breath sounds normal. No respiratory distress. He has no wheezes.  Abdominal: Soft. Bowel sounds are normal. He exhibits no distension. There is no tenderness.  Musculoskeletal:  Has left hemiparesis  Has tenderness right side of lower back with pain in legs  Is able to turn on side with some pain present   Lymphadenopathy:    He has no cervical adenopathy.  Skin: Skin is warm and dry. He is not diaphoretic.  Psychiatric: He has a normal mood and affect.    ASSESSMENT/ PLAN:  TODAY  1. Acute on chronic low back pain: worsewill continue ultram 50 mg twice daily and wil begin tylenol 1 gm three times daily for 10 day then every 8 hours as needed   2. Diabetic peripheral neuropathy: worse; will increase neurontin to 200 mg nightly     MD is aware of resident's narcotic use and is in agreement with current plan of care. We will attempt to wean resident as apropriate     Ok Edwards NP [facility] Adult Medicine  Contact [phone] Monday through Friday 8am- 5pm  After hours call [phone]

## 2017-01-15 ENCOUNTER — Encounter: Payer: Self-pay | Admitting: Adult Health

## 2017-01-15 ENCOUNTER — Non-Acute Institutional Stay (SKILLED_NURSING_FACILITY): Payer: Medicare Other | Admitting: Adult Health

## 2017-01-15 DIAGNOSIS — F0280 Dementia in other diseases classified elsewhere without behavioral disturbance: Secondary | ICD-10-CM

## 2017-01-15 DIAGNOSIS — F329 Major depressive disorder, single episode, unspecified: Secondary | ICD-10-CM | POA: Diagnosis not present

## 2017-01-15 DIAGNOSIS — F0393 Unspecified dementia, unspecified severity, with mood disturbance: Secondary | ICD-10-CM

## 2017-01-15 NOTE — Progress Notes (Addendum)
Location:   [place] Room Number: 107 B Place of Service:  SNF (31)   CODE STATUS: Full Code  Allergies  Allergen Reactions  . Tuberculin Tests Other (See Comments)    Per MAR (no description provided)    Chief Complaint  Patient presents with  . Acute Visit    Behaviors    HPI:  Staff report that for the past week or so he has had increasing episodes of agitation present. He is a poor historian; but does tell me that he is anxious at times. He does deny any anger. He denies insomnia and denies any changes in appetite. The staff feel as though his depression is worse.   Past Medical History:  Diagnosis Date  . AICD (automatic cardioverter/defibrillator) present   . Arthritis    "used to have a touch in my legs"  . Bradycardia 06/01/2014  . CAD (coronary artery disease)    LHC 9/05 with Dr. Einar Gip:  dLM 20-30%, LAD 85%, oD1 20-30%.  PCI:  Taxus DES to LAD; Dx jailed and tx with POBA.  Last myoview 12/10: inf scar, no ischemia, EF 29%.  . CHF (congestive heart failure) ([place])   . Crohn's disease ([place])   . Dementia   . Diabetes mellitus    11/05/11 "borderline; don't take medications"  . Dilated cardiomyopathy ([place])    2/12: EF 30-35%, trivial AI, mild RAE.  EF 2014 40 -45%  . DVT (deep venous thrombosis) ([place])   . HCAP (healthcare-associated pneumonia) 09/29/2015  . HLD (hyperlipidemia)   . 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 Hyperlipidemia   . Hypertension   . Memory difficulties   . Nephrolithiasis   . PFO (patent foramen ovale)    Not mentioned on 2014 echo.  . Pneumonia ~ 2011   09-22-13 denies any recent SOB or breathing problems  . Pulmonary embolus (HCC)    chronic coumadin  . Stroke [facility]) 1993   "left arm can't hold steady; leg too"  . Swelling of both ankles     09-22-13 occ.feet, but denies pain.    Past Surgical History:  Procedure Laterality Date  . APPENDECTOMY    . COLON SURGERY  1994; 1996   "for Crohn's disease"  . IMPLANTABLE  CARDIOVERTER DEFIBRILLATOR IMPLANT N/A 06/02/2014   Procedure: IMPLANTABLE CARDIOVERTER DEFIBRILLATOR IMPLANT;  Surgeon: [name], MD;  Location: [facility] CATH LAB;  Service: Cardiovascular;  Laterality: N/A;    Social History   Social History  . Marital status: Married    Spouse name: N/A  . Number of children: N/A  . Years of education: N/A   Occupational History  . Not on file.   Social History Main Topics  . Smoking status: Current Some Day Smoker    Packs/day: 0.50    Years: 53.00    Types: Cigarettes  . Smokeless tobacco: Never Used  . Alcohol use No  . Drug use: No  . Sexual activity: Not Currently   Other Topics Concern  . Not on file   Social History Narrative  . No narrative on file   Family History  Problem Relation Age of Onset  . Diabetes type II Mother   . CAD Father   . Diabetes type II Brother       VITAL SIGNS BP 128/70   Pulse 88   Temp 98.7 F (37.1 C)   Resp 18   Ht 5' 3"  (1.6 m)   Wt 124 lb 9.6 oz (56.5 kg)   SpO2  97%   BMI 22.07 kg/m   Patient's Medications  New Prescriptions   No medications on file  Previous Medications   ASPIRIN 81 MG TABLET    Take 81 mg by mouth daily.   DONEPEZIL (ARICEPT) 10 MG TABLET    Take 10 mg by mouth at bedtime.    GABAPENTIN (NEURONTIN) 100 MG CAPSULE    Take 100 mg by mouth at bedtime.   LATANOPROST (XALATAN) 0.005 % OPHTHALMIC SOLUTION    Place 1 drop into both eyes at bedtime.   LEVETIRACETAM (KEPPRA) 500 MG TABLET    Take 1 tablet (500 mg total) by mouth 2 (two) times daily.   NUTRITIONAL SUPPLEMENTS (NUTRITIONAL SUPPLEMENT PO)    Med Pass - Give 120 ml by mouth two times daily for Health Supplement   NUTRITIONAL SUPPLEMENTS (NUTRITIONAL SUPPLEMENT PO)    House supplement - House Shake - Give 4 oz shakes at meals   POLYVINYL ALCOHOL (LIQUIFILM TEARS) 1.4 % OPHTHALMIC SOLUTION    Place 1 drop into both eyes 2 (two) times daily.   RIVAROXABAN (XARELTO) 20 MG TABS TABLET    Take 20 mg by mouth at  bedtime.   SENNA (SENOKOT) 8.6 MG TABS TABLET    Take 1 tablet by mouth every morning.   SERTRALINE (ZOLOFT) 50 MG TABLET    Take 75 mg by mouth every morning.    TAMSULOSIN (FLOMAX) 0.4 MG CAPS CAPSULE    Take 0.4 mg by mouth at bedtime.   TRAMADOL (ULTRAM) 50 MG TABLET    Take 1 tablet (50 mg total) by mouth 2 (two) times daily.   TRAZODONE (DESYREL) 50 MG TABLET    Take 25 mg by mouth at bedtime.  Modified Medications   No medications on file  Discontinued Medications   No medications on file     SIGNIFICANT DIAGNOSTIC EXAMS  PREVIOUS  10-06-15: 2-d echo:   - Left ventricle: The cavity size was normal. Systolic function was moderately to severely reduced. The estimated ejection fraction was in the range of 30% to 35%. Diffuse hypokinesis. Doppler parameters are consistent with abnormal left ventricular relaxation (grade 1 diastolic dysfunction). There was no evidence of elevated ventricular filling pressure by Doppler parameters. - Aortic valve: There was mild regurgitation. - Aortic root: The aortic root was normal in size. - Mitral valve: Structurally normal valve. There was mild regurgitation. - Left atrium: The atrium was mildly dilated. - Right ventricle: Pacer wire or catheter noted in right ventricle. Systolic function was normal. - Right atrium: The atrium was moderately dilated. Pacer wire or catheter noted in right atrium. - Tricuspid valve: There was mild regurgitation. - Pulmonary arteries: Systolic pressure was mildly increased. PA peak pressure: 39 mm Hg (S). - Inferior vena cava: The vessel was dilated. The respirophasic diameter changes were blunted (< 50%), consistent with elevated central venous pressure. - Pericardium, extracardiac: There was no pericardial effusion. Impressions: - When compared to the prior study from 05/31/14 the LVEF has improved, now 30-35% with diffuse hypokinesis.   03-27-16: EEG: This awake and drowsy EEG is abnormal due to the presence  of: 1. Mild diffuse slowing of the waking background 2. Focal slowing over the left temporal region 3. Rare sharp waves over the left temporal region Clinical Correlation of the above findings indicates diffuse cerebral dysfunction that is non-specific in etiology and can be seen with hypoxic/ischemic injury, toxic/metabolic encephalopathies, neurodegenerative disorders, or medication effect. Focal slowing over the left temporal region indicates focal cerebral dysfunction  in this region suggestive of underlying structural or physiologic abnormality. There is a possible tendency for seizures to arise from this region. There were no electrographic seizures in this study. Clinical correlation is advised.  06-05-16: bilateral hip x-ray: 1. Degenerative changes lumbar spine and both hips. No acute abnormality. 2.  Aortoiliac atherosclerotic vascular disease.   06-05-16: ct of head and cervical spine: 1. No evidence of significant acute traumatic injury to the skull, brain or cervical spine. 2. Mild cerebral atrophy with extensive chronic microvascular ischemic changes, multiple lacunar infarcts in the basal ganglia and right thalamus, and old bilateral (left greater than right) cerebellar infarcts, as above. 3. Multilevel degenerative disc disease and cervical spondylosis, as above.  NO NEW EXAMS     LABS REVIEWED: PREVIOUS:  03-26-16: wbc 11.4 hgb 10.0; hct 34.5; mcv 71.7; plt 216; glucose 136; bun 16; creat 1.45; k+ 4.2; na++ 138; liver normal albumin 3.2 03-28-16: glucose 114; bun 20; creat 1.45; k+ 4.2; na++ 138  04-01-16: wbc 6.3; hgb 9.9; hct 35.4; mcv 75.0; plt 210; glucose 86; bun 17.8; creat 1.15; k+ 4.7; na++ 140; liver normal albumin 3.8; chol 157; ldl 67; trig 119; hdl 66 04-04-16: wbc 10.1; hgb 10.5; hct 37.3; mcv 75.4; plt 229; glucose 199; bun 19.6; creat 1.28; k+ 4.2; na++ 140  04-15-16: hgb a1c 6.9  05-11-16: wbc 9.4; hgb 9.8; hct 33.6; mcv 71.9; plt 198; glucose 190; bun 18; creat  1.44; k+ 4.5; na++ 135  07-31-16: wbc 5.6; hgb 10.4; hct 36.5; mcv 74.3; plt 236; glucose 74; bun 22.1; creat 1.19; k+ 4.9; na++ 143; liver normal albumin 3.8; hgb a1c 6.4  11-12-16: urine micro-albumin <1.2  TODAY:  12-24-16: wbc 6.0; hgb 9.6; hct 32.3; mcv 74.7; plt 239; glucose 74; bun 14.2; creat 1.11; k+ 4.7; na++ 141; ca 9.3; liver normal albumin 3.6    Review of Systems  Reason unable to perform ROS: poor historian   Constitutional: Negative for malaise/fatigue.  Respiratory: Negative for cough.   Cardiovascular: Negative for chest pain.  Gastrointestinal: Negative for abdominal pain and heartburn.  Musculoskeletal: Negative for back pain and joint pain.  Skin: Negative.   Neurological: Negative for dizziness.  Psychiatric/Behavioral: Negative for suicidal ideas. The patient is nervous/anxious. The patient does not have insomnia.     Physical Exam  Constitutional: No distress.  Neck: Neck supple.  Cardiovascular: Normal rate, regular rhythm, normal heart sounds and intact distal pulses.   Pulmonary/Chest: Effort normal and breath sounds normal. No respiratory distress.  Abdominal: Soft. Bowel sounds are normal. He exhibits no distension.  Musculoskeletal: He exhibits no edema.  Has left hemiparesis   Lymphadenopathy:    He has no cervical adenopathy.  Neurological: He is alert.  Skin: Skin is warm and dry. He is not diaphoretic.  Psychiatric: He has a normal mood and affect.   ASSESSMENT/ PLAN:  TODAY:   1. 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    MD is aware of resident's narcotic use and is in agreement with current plan of care. We will attempt to wean resident as apropriate   Ok Edwards NP [facility] Adult Medicine  Contact [phone] Monday through Friday 8am- 5pm  After hours call [phone]

## 2017-01-16 ENCOUNTER — Other Ambulatory Visit: Payer: Self-pay

## 2017-01-16 DIAGNOSIS — G8929 Other chronic pain: Secondary | ICD-10-CM | POA: Insufficient documentation

## 2017-01-16 DIAGNOSIS — E1142 Type 2 diabetes mellitus with diabetic polyneuropathy: Secondary | ICD-10-CM | POA: Insufficient documentation

## 2017-01-16 DIAGNOSIS — M545 Low back pain: Secondary | ICD-10-CM

## 2017-01-16 MED ORDER — TRAMADOL HCL 50 MG PO TABS
50.0000 mg | ORAL_TABLET | Freq: Two times a day (BID) | ORAL | 0 refills | Status: DC
Start: 2017-01-16 — End: 2017-02-19

## 2017-01-16 NOTE — Telephone Encounter (Signed)
RX faxed to [facility] @ [phone], phone number [phone]

## 2017-01-23 ENCOUNTER — Encounter: Payer: Self-pay | Admitting: Adult Health

## 2017-01-23 ENCOUNTER — Non-Acute Institutional Stay (SKILLED_NURSING_FACILITY): Payer: Medicare Other | Admitting: Adult Health

## 2017-01-23 DIAGNOSIS — I69354 Hemiplegia and hemiparesis following cerebral infarction affecting left non-dominant side: Secondary | ICD-10-CM | POA: Diagnosis not present

## 2017-01-23 DIAGNOSIS — I2782 Chronic pulmonary embolism: Secondary | ICD-10-CM

## 2017-01-23 DIAGNOSIS — I9589 Other hypotension: Secondary | ICD-10-CM | POA: Diagnosis not present

## 2017-01-23 DIAGNOSIS — K50919 Crohn's disease, unspecified, with unspecified complications: Secondary | ICD-10-CM | POA: Diagnosis not present

## 2017-01-23 DIAGNOSIS — I6340 Cerebral infarction due to embolism of unspecified cerebral artery: Secondary | ICD-10-CM | POA: Diagnosis not present

## 2017-01-23 NOTE — Progress Notes (Signed)
Location:   [facility] Room Number: 107 B Place of Service:  SNF (31)   CODE STATUS:  Full Code  Allergies  Allergen Reactions  . Tuberculin Tests Other (See Comments)    Per MAR (no description provided)    Chief Complaint  Patient presents with  . 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 He is unable to fully participate in the phi or ros. There are no reports of pain; changes in appetite; his weight is stable and no reports of behavioral issues. There are no nursing concerns a this time.    Past Medical History:  Diagnosis Date  . AICD (automatic cardioverter/defibrillator) present   . Arthritis    "used to have a touch in my legs"  . Bradycardia 06/01/2014  . CAD (coronary artery disease)    LHC 9/05 with Dr. Einar Gip:  dLM 20-30%, LAD 85%, oD1 20-30%.  PCI:  Taxus DES to LAD; Dx jailed and tx with POBA.  Last myoview 12/10: inf scar, no ischemia, EF 29%.  . CHF (congestive heart failure) ([place])   . Crohn's disease ([place])   . Dementia   . Diabetes mellitus    11/05/11 "borderline; don't take medications"  . Dilated cardiomyopathy ([place])    2/12: EF 30-35%, trivial AI, mild RAE.  EF 2014 40 -45%  . DVT (deep venous thrombosis) ([place])   . HCAP (healthcare-associated pneumonia) 09/29/2015  . HLD (hyperlipidemia)   . 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 Hyperlipidemia   . Hypertension   . Memory difficulties   . Nephrolithiasis   . PFO (patent foramen ovale)    Not mentioned on 2014 echo.  . Pneumonia ~ 2011   09-22-13 denies any recent SOB or breathing problems  . Pulmonary embolus (HCC)    chronic coumadin  . Stroke [facility]) 1993   "left  arm can't hold steady; leg too"  . Swelling of both ankles     09-22-13 occ.feet, but denies pain.    Past Surgical History:  Procedure Laterality Date  . APPENDECTOMY    . COLON SURGERY  1994; 1996   "for Crohn's disease"  . IMPLANTABLE CARDIOVERTER DEFIBRILLATOR IMPLANT N/A 06/02/2014   Procedure: IMPLANTABLE CARDIOVERTER DEFIBRILLATOR IMPLANT;  Surgeon: [name], MD;  Location: [facility] CATH LAB;  Service: Cardiovascular;  Laterality: N/A;    Social History   Social History  . Marital status: Married    Spouse name: N/A  . Number of children: N/A  . Years of education: N/A   Occupational History  . Not on file.   Social History Main Topics  . Smoking status: Current Some Day Smoker    Packs/day: 0.50    Years: 53.00    Types: Cigarettes  . Smokeless tobacco: Never Used  . Alcohol use No  . Drug use: No  . Sexual activity: Not Currently   Other Topics Concern  . Not on file   Social History Narrative  . No narrative on file   Family History  Problem Relation Age of Onset  . Diabetes type II Mother   . CAD Father   . Diabetes type II Brother       VITAL  SIGNS BP 134/66   Pulse 88   Temp (!) 96.4 F (35.8 C)   Resp 18   Ht 5' 3"  (1.6 m)   Wt 122 lb 14.4 oz (55.7 kg)   SpO2 96%   BMI 21.77 kg/m    Patient's Medications  New Prescriptions   No medications on file  Previous Medications   ASPIRIN 81 MG TABLET    Take 81 mg by mouth daily.   DONEPEZIL (ARICEPT) 10 MG TABLET    Take 10 mg by mouth at bedtime.    GABAPENTIN (NEURONTIN) 100 MG CAPSULE    Take 100 mg by mouth at bedtime.   LATANOPROST (XALATAN) 0.005 % OPHTHALMIC SOLUTION    Place 1 drop into both eyes at bedtime.   LEVETIRACETAM (KEPPRA) 500 MG TABLET    Take 1 tablet (500 mg total) by mouth 2 (two) times daily.   NUTRITIONAL SUPPLEMENTS (NUTRITIONAL SUPPLEMENT PO)    Med Pass - Give 120 ml by mouth two times daily for Health Supplement   NUTRITIONAL SUPPLEMENTS (NUTRITIONAL SUPPLEMENT  PO)    House supplement - House Shake - Give 4 oz shakes at meals   POLYVINYL ALCOHOL (LIQUIFILM TEARS) 1.4 % OPHTHALMIC SOLUTION    Place 1 drop into both eyes 2 (two) times daily.   RIVAROXABAN (XARELTO) 20 MG TABS TABLET    Take 20 mg by mouth at bedtime.   SENNA (SENOKOT) 8.6 MG TABS TABLET    Take 1 tablet by mouth every morning.   SERTRALINE (ZOLOFT) 100 MG TABLET    Take 100 mg by mouth every morning.    TAMSULOSIN (FLOMAX) 0.4 MG CAPS CAPSULE    Take 0.4 mg by mouth at bedtime.   TRAMADOL (ULTRAM) 50 MG TABLET    Take 1 tablet (50 mg total) by mouth 2 (two) times daily.   TRAZODONE (DESYREL) 50 MG TABLET    Take 25 mg by mouth at bedtime.  Modified Medications   No medications on file  Discontinued Medications   No medications on file     SIGNIFICANT DIAGNOSTIC EXAMS   PREVIOUS  10-06-15: 2-d echo:   - Left ventricle: The cavity size was normal. Systolic function was moderately to severely reduced. The estimated ejection fraction was in the range of 30% to 35%. Diffuse hypokinesis. Doppler parameters are consistent with abnormal left ventricular relaxation (grade 1 diastolic dysfunction). There was no evidence of elevated ventricular filling pressure by Doppler parameters. - Aortic valve: There was mild regurgitation. - Aortic root: The aortic root was normal in size. - Mitral valve: Structurally normal valve. There was mild regurgitation. - Left atrium: The atrium was mildly dilated. - Right ventricle: Pacer wire or catheter noted in right ventricle. Systolic function was normal. - Right atrium: The atrium was moderately dilated. Pacer wire or catheter noted in right atrium. - Tricuspid valve: There was mild regurgitation. - Pulmonary arteries: Systolic pressure was mildly increased. PA peak pressure: 39 mm Hg (S). - Inferior vena cava: The vessel was dilated. The respirophasic diameter changes were blunted (< 50%), consistent with elevated central venous pressure. -  Pericardium, extracardiac: There was no pericardial effusion. Impressions: - When compared to the prior study from 05/31/14 the LVEF has improved, now 30-35% with diffuse hypokinesis.   03-27-16: EEG: This awake and drowsy EEG is abnormal due to the presence of: 1. Mild diffuse slowing of the waking background 2. Focal slowing over the left temporal region 3. Rare sharp waves over the left temporal region  Clinical Correlation of the above findings indicates diffuse cerebral dysfunction that is non-specific in etiology and can be seen with hypoxic/ischemic injury, toxic/metabolic encephalopathies, neurodegenerative disorders, or medication effect. Focal slowing over the left temporal region indicates focal cerebral dysfunction in this region suggestive of underlying structural or physiologic abnormality. There is a possible tendency for seizures to arise from this region. There were no electrographic seizures in this study. Clinical correlation is advised.  06-05-16: bilateral hip x-ray: 1. Degenerative changes lumbar spine and both hips. No acute abnormality. 2.  Aortoiliac atherosclerotic vascular disease.   06-05-16: ct of head and cervical spine: 1. No evidence of significant acute traumatic injury to the skull, brain or cervical spine. 2. Mild cerebral atrophy with extensive chronic microvascular ischemic changes, multiple lacunar infarcts in the basal ganglia and right thalamus, and old bilateral (left greater than right) cerebellar infarcts, as above. 3. Multilevel degenerative disc disease and cervical spondylosis, as above.  NO NEW EXAMS     LABS REVIEWED: PREVIOUS:  03-26-16: wbc 11.4 hgb 10.0; hct 34.5; mcv 71.7; plt 216; glucose 136; bun 16; creat 1.45; k+ 4.2; na++ 138; liver normal albumin 3.2 03-28-16: glucose 114; bun 20; creat 1.45; k+ 4.2; na++ 138  04-01-16: wbc 6.3; hgb 9.9; hct 35.4; mcv 75.0; plt 210; glucose 86; bun 17.8; creat 1.15; k+ 4.7; na++ 140; liver normal albumin  3.8; chol 157; ldl 67; trig 119; hdl 66 04-04-16: wbc 10.1; hgb 10.5; hct 37.3; mcv 75.4; plt 229; glucose 199; bun 19.6; creat 1.28; k+ 4.2; na++ 140  04-15-16: hgb a1c 6.9  05-11-16: wbc 9.4; hgb 9.8; hct 33.6; mcv 71.9; plt 198; glucose 190; bun 18; creat 1.44; k+ 4.5; na++ 135  07-31-16: wbc 5.6; hgb 10.4; hct 36.5; mcv 74.3; plt 236; glucose 74; bun 22.1; creat 1.19; k+ 4.9; na++ 143; liver normal albumin 3.8; hgb a1c 6.4  11-12-16: urine micro-albumin <1.2  TODAY:  12-24-16: wbc 6.0; hgb 9.6; hct 32.3; mcv 74.7; plt 239; glucose 74; bun 14.2; creat 1.11; k+ 4.7; na++ 141; ca 9.3; liver normal albumin 3.6  Hgb a1c 5.9; chol 173; ldl 86; trig 88; hdl 69    Review of Systems  Reason unable to perform ROS: poor historian   Constitutional: Negative for malaise/fatigue.  Respiratory: Negative for cough.   Cardiovascular: Negative for chest pain and leg swelling.  Gastrointestinal: Negative for abdominal pain and heartburn.  Musculoskeletal: Negative for back pain.  Skin: Negative.   Neurological: Negative for dizziness.  Psychiatric/Behavioral: The patient is not nervous/anxious.    Physical Exam  Constitutional: No distress.  Thin   Neck: Neck supple. No thyromegaly present.  Cardiovascular: Normal rate, regular rhythm and intact distal pulses.   Murmur heard. 1/6  Pulmonary/Chest: Effort normal and breath sounds normal. No respiratory distress.  Abdominal: Soft. Bowel sounds are normal. He exhibits no distension. There is no tenderness.  Musculoskeletal: He exhibits no edema.  Left hemiparesis   Lymphadenopathy:    He has no cervical adenopathy.  Neurological: He is alert.  Skin: Skin is warm and dry. He is not diaphoretic.  Psychiatric: He has a normal mood and affect.     ASSESSMENT/ PLAN:   TODAY  1. PE: is stable will continue xarelto 20 mg daily   2. CVA: with left hemiparesis:  is neurologically stable will continue xarelto 20 mg daily   3. Glaucoma: stable  will  continue xalatan to both eyes.   4. Crohn's disease: has constipation: stable  will continue senna  daily   5. Hypotension: b/p 134/66  stable  is currently not on medications; will not make changes will monitor   PREVIOUS:   6. Seizures: stable no reports of recent seizure activities; will continue keppra 500 mg twice daily and will monitor  7. Diabetes: stable  hgb a1c 5.9 currently not on medications; unable to tolerate ace due to low blood pressure   8.  Bilateral lower extremity pain: stable  Continue  tylenol  1 gm twice daily and ultram 50 mg twice daily  9.  Dyslipidemia: ldl is 67 is stable his lipitor was stopped due to weight loss.   10. Dementia: without significant change; his current weight is 122 pounds previous weight 124 pounds is presently stable; will continue aricept 10 mg nightly   11  BPH: stable  will continue flomax 0.4 mg daily   13. C hronic diastoic heart failure: EF 30-35% (10-06-15) is status post ICD placement (interrogated; now pace maker function only) is stable   is currently not on medications; will not make changes will monitor  14. CAD: no indications of chest pain present; will continue asa 81 mg daily  15. COPD: has xopenex neb every 6 hours as needed  will not make changes will monitor his status.   16.Depression: will continue zoloft 50 mg daily   17. CKD stage III: bun/creat 14.2/1.11   MD is aware of resident's narcotic use and is in agreement with current plan of care. We will attempt to wean resident as apropriate   Ok Edwards NP [facility]  Contact [phone] Monday through Friday 8am- 5pm  After hours call [phone]

## 2017-02-10 DIAGNOSIS — I69354 Hemiplegia and hemiparesis following cerebral infarction affecting left non-dominant side: Secondary | ICD-10-CM | POA: Insufficient documentation

## 2017-02-19 ENCOUNTER — Other Ambulatory Visit: Payer: Self-pay

## 2017-02-19 MED ORDER — TRAMADOL HCL 50 MG PO TABS
50.0000 mg | ORAL_TABLET | Freq: Two times a day (BID) | ORAL | 0 refills | Status: DC
Start: 2017-02-19 — End: 2017-03-28

## 2017-02-19 NOTE — Telephone Encounter (Signed)
RX faxed to [facility] @ [phone], phone number [phone]

## 2017-02-20 ENCOUNTER — Encounter: Payer: Self-pay | Admitting: Adult Health

## 2017-02-20 ENCOUNTER — Non-Acute Institutional Stay (SKILLED_NURSING_FACILITY): Payer: Medicare Other | Admitting: Adult Health

## 2017-02-20 DIAGNOSIS — M79605 Pain in left leg: Secondary | ICD-10-CM

## 2017-02-20 DIAGNOSIS — R569 Unspecified convulsions: Secondary | ICD-10-CM | POA: Diagnosis not present

## 2017-02-20 DIAGNOSIS — E1142 Type 2 diabetes mellitus with diabetic polyneuropathy: Secondary | ICD-10-CM | POA: Diagnosis not present

## 2017-02-20 DIAGNOSIS — N183 Chronic kidney disease, stage 3 unspecified: Secondary | ICD-10-CM

## 2017-02-20 DIAGNOSIS — E785 Hyperlipidemia, unspecified: Secondary | ICD-10-CM | POA: Diagnosis not present

## 2017-02-20 DIAGNOSIS — M79604 Pain in right leg: Secondary | ICD-10-CM | POA: Diagnosis not present

## 2017-02-20 DIAGNOSIS — E1169 Type 2 diabetes mellitus with other specified complication: Secondary | ICD-10-CM

## 2017-02-20 DIAGNOSIS — E1122 Type 2 diabetes mellitus with diabetic chronic kidney disease: Secondary | ICD-10-CM | POA: Diagnosis not present

## 2017-02-20 NOTE — Progress Notes (Signed)
Location:   [facility] Room Number: 107 B Place of Service:  SNF (31)   CODE STATUS: Full Code  Allergies  Allergen Reactions  . Tuberculin Tests Other (See Comments)    Per MAR (no description provided)    Chief Complaint  Patient presents with  . Medical Management of Chronic Issues    Seizures; diabetes; lower extremity pain; and dyslipidemia     HPI:  He is a 76 year old long term resident of this facility being seen for the management of his chronic illnesses: diabetic peripheral neuropathy associated with type 2 diabetes; DM type 2 causing ckd stage 3; dyslipidemia associated with type 2 diabetes; seizures; bilateral lower extremity pain. He is a poor historian; but denies any leg pain; no complaints of headache or changes in appetite. There are no nursing concerns at this time.   Past Medical History:  Diagnosis Date  . AICD (automatic cardioverter/defibrillator) present   . Arthritis    "used to have a touch in my legs"  . Bradycardia 06/01/2014  . CAD (coronary artery disease)    LHC 9/05 with Dr. Einar Gip:  dLM 20-30%, LAD 85%, oD1 20-30%.  PCI:  Taxus DES to LAD; Dx jailed and tx with POBA.  Last myoview 12/10: inf scar, no ischemia, EF 29%.  . CHF (congestive heart failure) ([place])   . Crohn's disease ([place])   . Dementia   . Diabetes mellitus    11/05/11 "borderline; don't take medications"  . Dilated cardiomyopathy ([place])    2/12: EF 30-35%, trivial AI, mild RAE.  EF 2014 40 -45%  . DVT (deep venous thrombosis) ([place])   . HCAP (healthcare-associated pneumonia) 09/29/2015  . HLD (hyperlipidemia)   . 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 Hyperlipidemia   . Hypertension   . Memory difficulties   . Nephrolithiasis   . PFO (patent foramen ovale)    Not mentioned on 2014 echo.  . Pneumonia ~ 2011   09-22-13 denies any recent SOB or breathing problems  . Pulmonary embolus (HCC)    chronic coumadin  . Stroke [facility]) 1993   "left arm can't hold steady; leg too"  .  Swelling of both ankles     09-22-13 occ.feet, but denies pain.    Past Surgical History:  Procedure Laterality Date  . APPENDECTOMY    . COLON SURGERY  1994; 1996   "for Crohn's disease"    Social History   Socioeconomic History  . Marital status: Married    Spouse name: Not on file  . Number of children: Not on file  . Years of education: Not on file  . Highest education level: Not on file  Social Needs  . Financial resource strain: Not on file  . Food insecurity - worry: Not on file  . Food insecurity - inability: Not on file  . Transportation needs - medical: Not on file  . Transportation needs - non-medical: Not on file  Occupational History  . Not on file  Tobacco Use  . Smoking status: Current Some Day Smoker    Packs/day: 0.50    Years: 53.00    Pack years: 26.50    Types: Cigarettes  . Smokeless tobacco: Never Used  Substance and Sexual Activity  . Alcohol use: No    Alcohol/week: 0.0 oz  . Drug use: No  . Sexual activity: Not Currently  Other Topics Concern  . Not on file  Social History Narrative  . Not on file   Family History  Problem Relation Age of Onset  . Diabetes type II Mother   . CAD Father   . Diabetes type II Brother       VITAL SIGNS BP 127/72   Pulse 88   Temp 98.8 F (37.1 C)   Resp 20   Ht 5' 3"  (1.6 m)   Wt 122 lb 14.4 oz (55.7 kg)   SpO2 94%   BMI 21.77 kg/m     Medication List        Accurate as of 02/20/17 10:59 AM. Always use your most recent med list.          aspirin 81 MG tablet   donepezil 10 MG tablet Commonly known as:  ARICEPT   gabapentin 100 MG capsule Commonly known as:  NEURONTIN   latanoprost 0.005 % ophthalmic solution Commonly known as:  XALATAN   levETIRAcetam 500 MG tablet Commonly known as:  KEPPRA Take 1 tablet (500 mg total) by mouth 2 (two) times daily.   * NUTRITIONAL SUPPLEMENT PO   * NUTRITIONAL SUPPLEMENT PO   polyvinyl alcohol 1.4 % ophthalmic solution Commonly known as:   LIQUIFILM TEARS   rivaroxaban 20 MG Tabs tablet Commonly known as:  XARELTO   senna 8.6 MG Tabs tablet Commonly known as:  SENOKOT   sertraline 100 MG tablet Commonly known as:  ZOLOFT   tamsulosin 0.4 MG Caps capsule Commonly known as:  FLOMAX   traMADol 50 MG tablet Commonly known as:  ULTRAM Take 1 tablet (50 mg total) 2 (two) times daily by mouth.   traZODone 50 MG tablet Commonly known as:  DESYREL      * This list has 2 medication(s) that are the same as other medications prescribed for you. Read the directions carefully, and ask your doctor or other care provider to review them with you.           SIGNIFICANT DIAGNOSTIC EXAMS  PREVIOUS  10-06-15: 2-d echo:   - Left ventricle: The cavity size was normal. Systolic function was moderately to severely reduced. The estimated ejection fraction was in the range of 30% to 35%. Diffuse hypokinesis. Doppler parameters are consistent with abnormal left ventricular relaxation (grade 1 diastolic dysfunction). There was no evidence of elevated ventricular filling pressure by Doppler parameters. - Aortic valve: There was mild regurgitation. - Aortic root: The aortic root was normal in size. - Mitral valve: Structurally normal valve. There was mild regurgitation. - Left atrium: The atrium was mildly dilated. - Right ventricle: Pacer wire or catheter noted in right ventricle. Systolic function was normal. - Right atrium: The atrium was moderately dilated. Pacer wire or catheter noted in right atrium. - Tricuspid valve: There was mild regurgitation. - Pulmonary arteries: Systolic pressure was mildly increased. PA peak pressure: 39 mm Hg (S). - Inferior vena cava: The vessel was dilated. The respirophasic diameter changes were blunted (< 50%), consistent with elevated central venous pressure. - Pericardium, extracardiac: There was no pericardial effusion. Impressions: - When compared to the prior study from 05/31/14 the LVEF has  improved, now 30-35% with diffuse hypokinesis.   03-27-16: EEG: This awake and drowsy EEG is abnormal due to the presence of: 1. Mild diffuse slowing of the waking background 2. Focal slowing over the left temporal region 3. Rare sharp waves over the left temporal region Clinical Correlation of the above findings indicates diffuse cerebral dysfunction that is non-specific in etiology and can be seen with hypoxic/ischemic injury, toxic/metabolic encephalopathies, neurodegenerative disorders, or medication effect. Focal slowing  over the left temporal region indicates focal cerebral dysfunction in this region suggestive of underlying structural or physiologic abnormality. There is a possible tendency for seizures to arise from this region. There were no electrographic seizures in this study. Clinical correlation is advised.  06-05-16: bilateral hip x-ray: 1. Degenerative changes lumbar spine and both hips. No acute abnormality. 2.  Aortoiliac atherosclerotic vascular disease.   06-05-16: ct of head and cervical spine: 1. No evidence of significant acute traumatic injury to the skull, brain or cervical spine. 2. Mild cerebral atrophy with extensive chronic microvascular ischemic changes, multiple lacunar infarcts in the basal ganglia and right thalamus, and old bilateral (left greater than right) cerebellar infarcts, as above. 3. Multilevel degenerative disc disease and cervical spondylosis, as above.  NO NEW EXAMS     LABS REVIEWED: PREVIOUS:  03-26-16: wbc 11.4 hgb 10.0; hct 34.5; mcv 71.7; plt 216; glucose 136; bun 16; creat 1.45; k+ 4.2; na++ 138; liver normal albumin 3.2 03-28-16: glucose 114; bun 20; creat 1.45; k+ 4.2; na++ 138  04-01-16: wbc 6.3; hgb 9.9; hct 35.4; mcv 75.0; plt 210; glucose 86; bun 17.8; creat 1.15; k+ 4.7; na++ 140; liver normal albumin 3.8; chol 157; ldl 67; trig 119; hdl 66 04-04-16: wbc 10.1; hgb 10.5; hct 37.3; mcv 75.4; plt 229; glucose 199; bun 19.6; creat 1.28; k+  4.2; na++ 140  04-15-16: hgb a1c 6.9  05-11-16: wbc 9.4; hgb 9.8; hct 33.6; mcv 71.9; plt 198; glucose 190; bun 18; creat 1.44; k+ 4.5; na++ 135  07-31-16: wbc 5.6; hgb 10.4; hct 36.5; mcv 74.3; plt 236; glucose 74; bun 22.1; creat 1.19; k+ 4.9; na++ 143; liver normal albumin 3.8; hgb a1c 6.4  11-12-16: urine micro-albumin <1.2 12-24-16: wbc 6.0; hgb 9.6; hct 32.3; mcv 74.7; plt 239; glucose 74; bun 14.2; creat 1.11; k+ 4.7; na++ 141; ca 9.3; liver normal albumin 3.6  Hgb a1c 5.9; chol 173; ldl 86; trig 88; hdl 69  NO NEW LABS     Review of Systems  Reason unable to perform ROS: poor historian   Constitutional: Negative for malaise/fatigue.  Respiratory: Negative for cough and shortness of breath.   Cardiovascular: Negative for chest pain and leg swelling.  Gastrointestinal: Negative for abdominal pain.  Musculoskeletal: Negative for back pain.  Skin: Negative.   Neurological: Negative for dizziness.  Psychiatric/Behavioral: The patient is not nervous/anxious.     Physical Exam  Constitutional: No distress.  Thin   Neck: Neck supple. No thyromegaly present.  Cardiovascular: Normal rate, regular rhythm and intact distal pulses.  Murmur heard. 1/6  Pulmonary/Chest: Effort normal and breath sounds normal. No respiratory distress.  Abdominal: Soft. Bowel sounds are normal. He exhibits no distension. There is no tenderness.  Musculoskeletal: He exhibits no edema.  Left hemiparesis   Lymphadenopathy:    He has no cervical adenopathy.  Neurological: He is alert.  Skin: Skin is warm and dry. He is not diaphoretic.  Psychiatric: He has a normal mood and affect.     ASSESSMENT/ PLAN:  TODAY  1. Seizures: stable no reports of recent seizure activities; will continue keppra 500 mg twice daily and will monitor  2. Diabetes: stable  hgb a1c 5.9 currently not on medications; unable to tolerate ace due to low blood pressure   3.  Bilateral lower extremity pain: stable  Continue  tylenol  1  gm twice daily and ultram 50 mg twice daily  4.  Dyslipidemia: ldl is 67 is stable his lipitor was stopped due to  weight loss.   PREVIOUS:   5. Dementia: without significant change; his current weight is 122 pounds previous weight 124 pounds is presently stable; will continue aricept 10 mg nightly   6  BPH: stable  will continue flomax 0.4 mg daily   7. Chronic diastoic heart failure: EF 30-35% (10-06-15) is status post ICD placement (interrogated; now pace maker function only) is stable   is currently not on medications; will not make changes will monitor  8. CAD: no indications of chest pain present; will continue asa 81 mg daily  9. COPD stable no currently on routine medications   will not make changes will monitor his status.   10.Depression: stable will continue zoloft 50 mg daily   11. CKD stage FAW:NOPWKHI change  bun/creat 14.2/1.11  12. 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 Glaucoma: stable  will continue xalatan to both eyes.   15. Crohn's disease: has constipation: stable  will continue senna daily   16. Hypotension: b/p 122/72  stable  is currently not on medications; will not make changes will monitor      MD is aware of resident's narcotic use and is in agreement with current plan of care. We will attempt to wean resident as apropriate     Ok Edwards NP [facility]  Contact [phone] Monday through Friday 8am- 5pm  After hours call [phone]

## 2017-03-05 ENCOUNTER — Encounter: Payer: Self-pay | Admitting: Adult Health

## 2017-03-05 ENCOUNTER — Non-Acute Institutional Stay (SKILLED_NURSING_FACILITY): Payer: Medicare Other | Admitting: Adult Health

## 2017-03-05 DIAGNOSIS — F418 Other specified anxiety disorders: Secondary | ICD-10-CM | POA: Diagnosis not present

## 2017-03-05 NOTE — Progress Notes (Signed)
Location:   [place] Room Number: 107 B Place of Service:  SNF (31)   CODE STATUS: Full Code  Allergies  Allergen Reactions  . Tuberculin Tests Other (See Comments)    Per MAR (no description provided)    Chief Complaint  Patient presents with  . Acute Visit    GDR    HPI:  He is on long term trazodone in order to better manage his depression with anxiety. He is currently taking zoloft 100 mg daily and trazodone 25 mg nightly. His emotional status is stable there are no reports of behavioral issues; no sleep disturbances; no episodes of crying present; he does socialize with others. There are no nursing concerns at this time.   Past Medical History:  Diagnosis Date  . AICD (automatic cardioverter/defibrillator) present   . Arthritis    "used to have a touch in my legs"  . Bradycardia 06/01/2014  . CAD (coronary artery disease)    LHC 9/05 with Dr. Einar Gip:  dLM 20-30%, LAD 85%, oD1 20-30%.  PCI:  Taxus DES to LAD; Dx jailed and tx with POBA.  Last myoview 12/10: inf scar, no ischemia, EF 29%.  . CHF (congestive heart failure) ([place])   . Crohn's disease ([place])   . Dementia   . Diabetes mellitus    11/05/11 "borderline; don't take medications"  . Dilated cardiomyopathy ([place])    2/12: EF 30-35%, trivial AI, mild RAE.  EF 2014 40 -45%  . DVT (deep venous thrombosis) ([place])   . HCAP (healthcare-associated pneumonia) 09/29/2015  . HLD (hyperlipidemia)   . 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 Hyperlipidemia   . Hypertension   . Memory difficulties   . Nephrolithiasis   . PFO (patent foramen ovale)    Not mentioned on 2014 echo.  . Pneumonia ~ 2011   09-22-13 denies any recent SOB or breathing problems  . Pulmonary embolus (HCC)    chronic coumadin  . Stroke [facility]) 1993   "left arm can't hold steady; leg too"  . Swelling of both ankles     09-22-13 occ.feet, but denies pain.    Past Surgical History:  Procedure Laterality Date  . APPENDECTOMY    . COLON SURGERY   1994; 1996   "for Crohn's disease"  . IMPLANTABLE CARDIOVERTER DEFIBRILLATOR IMPLANT N/A 06/02/2014   Procedure: IMPLANTABLE CARDIOVERTER DEFIBRILLATOR IMPLANT;  Surgeon: [name], MD;  Location: [facility] CATH LAB;  Service: Cardiovascular;  Laterality: N/A;    Social History   Socioeconomic History  . Marital status: Married    Spouse name: Not on file  . Number of children: Not on file  . Years of education: Not on file  . Highest education level: Not on file  Social Needs  . Financial resource strain: Not on file  . Food insecurity - worry: Not on file  . Food insecurity - inability: Not on file  . Transportation needs - medical: Not on file  . Transportation needs - non-medical: Not on file  Occupational History  . Not on file  Tobacco Use  . Smoking status: Current Some Day Smoker    Packs/day: 0.50    Years: 53.00    Pack years: 26.50    Types: Cigarettes  . Smokeless tobacco: Never Used  Substance and Sexual Activity  . Alcohol use: No    Alcohol/week: 0.0 oz  . Drug use: No  . Sexual activity: Not Currently  Other Topics Concern  . Not on file  Social History Narrative  .  Not on file   Family History  Problem Relation Age of Onset  . Diabetes type II Mother   . CAD Father   . Diabetes type II Brother       VITAL SIGNS BP 130/66   Temp 98 F (36.7 C)   Ht 5' 3"  (1.6 m)   Wt 121 lb 14.4 oz (55.3 kg)   BMI 21.59 kg/m    Outpatient Encounter Medications as of 03/05/2017  Medication Sig  . aspirin 81 MG tablet Take 81 mg by mouth daily.  [name] donepezil (ARICEPT) 10 MG tablet Take 10 mg by mouth at bedtime.   . gabapentin (NEURONTIN) 100 MG capsule Take 100 mg by mouth at bedtime.  [name] latanoprost (XALATAN) 0.005 % ophthalmic solution Place 1 drop into both eyes at bedtime.  . levETIRAcetam (KEPPRA) 500 MG tablet Take 1 tablet (500 mg total) by mouth 2 (two) times daily.  . Nutritional Supplements (NUTRITIONAL SUPPLEMENT PO) Med Pass - Give 120 ml by mouth  two times daily for Health Supplement  . Nutritional Supplements (NUTRITIONAL SUPPLEMENT PO) House supplement - House Shake - Give 4 oz shakes at meals  . polyvinyl alcohol (LIQUIFILM TEARS) 1.4 % ophthalmic solution Place 1 drop into both eyes 2 (two) times daily.  . rivaroxaban (XARELTO) 20 MG TABS tablet Take 20 mg by mouth at bedtime.  . senna (SENOKOT) 8.6 MG TABS tablet Take 1 tablet by mouth every morning.  . sertraline (ZOLOFT) 100 MG tablet Take 100 mg by mouth every morning.   . tamsulosin (FLOMAX) 0.4 MG CAPS capsule Take 0.4 mg by mouth at bedtime.  . traMADol (ULTRAM) 50 MG tablet Take 1 tablet (50 mg total) 2 (two) times daily by mouth.  . traZODone (DESYREL) 50 MG tablet Take 25 mg by mouth at bedtime.   No facility-administered encounter medications on file as of 03/05/2017.      SIGNIFICANT DIAGNOSTIC EXAMS  PREVIOUS  10-06-15: 2-d echo:   - Left ventricle: The cavity size was normal. Systolic function was moderately to severely reduced. The estimated ejection fraction was in the range of 30% to 35%. Diffuse hypokinesis. Doppler parameters are consistent with abnormal left ventricular relaxation (grade 1 diastolic dysfunction). There was no evidence of elevated ventricular filling pressure by Doppler parameters. - Aortic valve: There was mild regurgitation. - Aortic root: The aortic root was normal in size. - Mitral valve: Structurally normal valve. There was mild regurgitation. - Left atrium: The atrium was mildly dilated. - Right ventricle: Pacer wire or catheter noted in right ventricle. Systolic function was normal. - Right atrium: The atrium was moderately dilated. Pacer wire or catheter noted in right atrium. - Tricuspid valve: There was mild regurgitation. - Pulmonary arteries: Systolic pressure was mildly increased. PA peak pressure: 39 mm Hg (S). - Inferior vena cava: The vessel was dilated. The respirophasic diameter changes were blunted (< 50%), consistent with  elevated central venous pressure. - Pericardium, extracardiac: There was no pericardial effusion. Impressions: - When compared to the prior study from 05/31/14 the LVEF has improved, now 30-35% with diffuse hypokinesis.   03-27-16: EEG: This awake and drowsy EEG is abnormal due to the presence of: 1. Mild diffuse slowing of the waking background 2. Focal slowing over the left temporal region 3. Rare sharp waves over the left temporal region Clinical Correlation of the above findings indicates diffuse cerebral dysfunction that is non-specific in etiology and can be seen with hypoxic/ischemic injury, toxic/metabolic encephalopathies, neurodegenerative disorders, or medication effect.  Focal slowing over the left temporal region indicates focal cerebral dysfunction in this region suggestive of underlying structural or physiologic abnormality. There is a possible tendency for seizures to arise from this region. There were no electrographic seizures in this study. Clinical correlation is advised.  06-05-16: bilateral hip x-ray: 1. Degenerative changes lumbar spine and both hips. No acute abnormality. 2.  Aortoiliac atherosclerotic vascular disease.   06-05-16: ct of head and cervical spine: 1. No evidence of significant acute traumatic injury to the skull, brain or cervical spine. 2. Mild cerebral atrophy with extensive chronic microvascular ischemic changes, multiple lacunar infarcts in the basal ganglia and right thalamus, and old bilateral (left greater than right) cerebellar infarcts, as above. 3. Multilevel degenerative disc disease and cervical spondylosis, as above.  NO NEW EXAMS     LABS REVIEWED: PREVIOUS:  03-26-16: wbc 11.4 hgb 10.0; hct 34.5; mcv 71.7; plt 216; glucose 136; bun 16; creat 1.45; k+ 4.2; na++ 138; liver normal albumin 3.2 03-28-16: glucose 114; bun 20; creat 1.45; k+ 4.2; na++ 138  04-01-16: wbc 6.3; hgb 9.9; hct 35.4; mcv 75.0; plt 210; glucose 86; bun 17.8; creat 1.15; k+  4.7; na++ 140; liver normal albumin 3.8; chol 157; ldl 67; trig 119; hdl 66 04-04-16: wbc 10.1; hgb 10.5; hct 37.3; mcv 75.4; plt 229; glucose 199; bun 19.6; creat 1.28; k+ 4.2; na++ 140  04-15-16: hgb a1c 6.9  05-11-16: wbc 9.4; hgb 9.8; hct 33.6; mcv 71.9; plt 198; glucose 190; bun 18; creat 1.44; k+ 4.5; na++ 135  07-31-16: wbc 5.6; hgb 10.4; hct 36.5; mcv 74.3; plt 236; glucose 74; bun 22.1; creat 1.19; k+ 4.9; na++ 143; liver normal albumin 3.8; hgb a1c 6.4  11-12-16: urine micro-albumin <1.2 12-24-16: wbc 6.0; hgb 9.6; hct 32.3; mcv 74.7; plt 239; glucose 74; bun 14.2; creat 1.11; k+ 4.7; na++ 141; ca 9.3; liver normal albumin 3.6  Hgb a1c 5.9; chol 173; ldl 86; trig 88; hdl 69  NO NEW LABS     Review of Systems  Reason unable to perform ROS: poor historian   Constitutional: Negative for malaise/fatigue.  Respiratory: Positive for shortness of breath. Negative for cough.   Cardiovascular: Negative for chest pain and leg swelling.  Gastrointestinal: Negative for abdominal pain.  Musculoskeletal: Negative for back pain and myalgias.  Skin: Negative.   Neurological: Negative for dizziness.  Psychiatric/Behavioral: The patient is not nervous/anxious.     Physical Exam  Constitutional: No distress.  Thin   Neck: Neck supple. No thyromegaly present.  Cardiovascular: Normal rate, regular rhythm and intact distal pulses.  Murmur heard. 1/6  Pulmonary/Chest: Effort normal and breath sounds normal. No respiratory distress.  Abdominal: Soft. Bowel sounds are normal. He exhibits no distension. There is no tenderness.  Musculoskeletal: Normal range of motion. He exhibits no edema.  Lymphadenopathy:    He has no cervical adenopathy.  Neurological: He is alert.  Skin: Skin is warm and dry. He is not diaphoretic.  Psychiatric: He has a normal mood and affect.    ASSESSMENT/ PLAN:  TODAY:   1. Depression with anxiety: he is emotionally stable on zoloft 100 mg daily and trazodone 25 mg  nightly. At this time will not make any further changes. Lowering his medications could cause him emotional harm. Will monitor his status.   MD is aware of resident's narcotic use and is in agreement with current plan of care. We will attempt to wean resident as apropriate   Ok Edwards NP [facility] Adult Medicine  Contact [phone] Monday through Friday 8am- 5pm  After hours call [phone]

## 2017-03-09 DIAGNOSIS — E1169 Type 2 diabetes mellitus with other specified complication: Secondary | ICD-10-CM | POA: Insufficient documentation

## 2017-03-09 DIAGNOSIS — E785 Hyperlipidemia, unspecified: Secondary | ICD-10-CM

## 2017-03-12 ENCOUNTER — Non-Acute Institutional Stay (SKILLED_NURSING_FACILITY): Payer: Medicare Other | Admitting: Adult Health

## 2017-03-12 ENCOUNTER — Encounter: Payer: Self-pay | Admitting: Adult Health

## 2017-03-12 DIAGNOSIS — J449 Chronic obstructive pulmonary disease, unspecified: Secondary | ICD-10-CM | POA: Diagnosis not present

## 2017-03-12 DIAGNOSIS — I5042 Chronic combined systolic (congestive) and diastolic (congestive) heart failure: Secondary | ICD-10-CM

## 2017-03-12 NOTE — Progress Notes (Signed)
Location:   [place] Room Number: 107 B Place of Service:  SNF (31)   CODE STATUS: Full Code  Allergies  Allergen Reactions  . Tuberculin Tests Other (See Comments)    Per MAR (no description provided)    Chief Complaint  Patient presents with  . Acute Visit    Care Plan Meeting    HPI:  We have come together with the care plan team for his routine care plan meeting. There is no family present; and he is unable to participate in the meeting. His weight has remained stable; his appetite remains stable. There are no reports of behavioral issues present. He is presently not in therapy. There are no nursing concerns at this time.   Past Medical History:  Diagnosis Date  . AICD (automatic cardioverter/defibrillator) present   . Arthritis    "used to have a touch in my legs"  . Bradycardia 06/01/2014  . CAD (coronary artery disease)    LHC 9/05 with Dr. Einar Gip:  dLM 20-30%, LAD 85%, oD1 20-30%.  PCI:  Taxus DES to LAD; Dx jailed and tx with POBA.  Last myoview 12/10: inf scar, no ischemia, EF 29%.  . CHF (congestive heart failure) ([place])   . Crohn's disease ([place])   . Dementia   . Diabetes mellitus    11/05/11 "borderline; don't take medications"  . Dilated cardiomyopathy ([place])    2/12: EF 30-35%, trivial AI, mild RAE.  EF 2014 40 -45%  . DVT (deep venous thrombosis) ([place])   . HCAP (healthcare-associated pneumonia) 09/29/2015  . HLD (hyperlipidemia)   . 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 Hyperlipidemia   . Hypertension   . Memory difficulties   . Nephrolithiasis   . PFO (patent foramen ovale)    Not mentioned on 2014 echo.  . Pneumonia ~ 2011   09-22-13 denies any recent SOB or breathing problems  . Pulmonary embolus (HCC)    chronic coumadin  . Stroke [facility]) 1993   "left arm can't hold steady; leg too"  . Swelling of both ankles     09-22-13 occ.feet, but denies pain.    Past Surgical History:  Procedure Laterality Date  . APPENDECTOMY    . COLON SURGERY   1994; 1996   "for Crohn's disease"  . IMPLANTABLE CARDIOVERTER DEFIBRILLATOR IMPLANT N/A 06/02/2014   Procedure: IMPLANTABLE CARDIOVERTER DEFIBRILLATOR IMPLANT;  Surgeon: [name], MD;  Location: [facility] CATH LAB;  Service: Cardiovascular;  Laterality: N/A;    Social History   Socioeconomic History  . Marital status: Married    Spouse name: Not on file  . Number of children: Not on file  . Years of education: Not on file  . Highest education level: Not on file  Social Needs  . Financial resource strain: Not on file  . Food insecurity - worry: Not on file  . Food insecurity - inability: Not on file  . Transportation needs - medical: Not on file  . Transportation needs - non-medical: Not on file  Occupational History  . Not on file  Tobacco Use  . Smoking status: Current Some Day Smoker    Packs/day: 0.50    Years: 53.00    Pack years: 26.50    Types: Cigarettes  . Smokeless tobacco: Never Used  Substance and Sexual Activity  . Alcohol use: No    Alcohol/week: 0.0 oz  . Drug use: No  . Sexual activity: Not Currently  Other Topics Concern  . Not on file  Social History  Narrative  . Not on file   Family History  Problem Relation Age of Onset  . Diabetes type II Mother   . CAD Father   . Diabetes type II Brother       VITAL SIGNS BP (!) 145/78   Temp 98.6 F (37 C)   Resp 20   Ht 5' 3"  (1.6 m)   Wt 121 lb 14.4 oz (55.3 kg)   SpO2 95%   BMI 21.59 kg/m   Outpatient Encounter Medications as of 03/12/2017  Medication Sig  . aspirin 81 MG tablet Take 81 mg by mouth daily.  [name] donepezil (ARICEPT) 10 MG tablet Take 10 mg by mouth at bedtime.   . gabapentin (NEURONTIN) 100 MG capsule Take 100 mg by mouth at bedtime.  [name] latanoprost (XALATAN) 0.005 % ophthalmic solution Place 1 drop into both eyes at bedtime.  . levETIRAcetam (KEPPRA) 500 MG tablet Take 1 tablet (500 mg total) by mouth 2 (two) times daily.  . Nutritional Supplements (NUTRITIONAL SUPPLEMENT PO) Med  Pass - Give 120 ml by mouth two times daily for Health Supplement  . Nutritional Supplements (NUTRITIONAL SUPPLEMENT PO) House supplement - House Shake - Give 4 oz shakes at meals  . polyvinyl alcohol (LIQUIFILM TEARS) 1.4 % ophthalmic solution Place 1 drop into both eyes 2 (two) times daily.  . rivaroxaban (XARELTO) 20 MG TABS tablet Take 20 mg by mouth at bedtime.  . senna (SENOKOT) 8.6 MG TABS tablet Take 1 tablet by mouth every morning.  [name] SERTRALINE HCL PO Take 75 mg by mouth every morning.   . tamsulosin (FLOMAX) 0.4 MG CAPS capsule Take 0.4 mg by mouth at bedtime.  . traMADol (ULTRAM) 50 MG tablet Take 1 tablet (50 mg total) 2 (two) times daily by mouth.  . traZODone (DESYREL) 50 MG tablet Take 25 mg by mouth at bedtime.   No facility-administered encounter medications on file as of 03/12/2017.      SIGNIFICANT DIAGNOSTIC EXAMS PREVIOUS  10-06-15: 2-d echo:   - Left ventricle: The cavity size was normal. Systolic function was moderately to severely reduced. The estimated ejection fraction was in the range of 30% to 35%. Diffuse hypokinesis. Doppler parameters are consistent with abnormal left ventricular relaxation (grade 1 diastolic dysfunction). There was no evidence of elevated ventricular filling pressure by Doppler parameters. - Aortic valve: There was mild regurgitation. - Aortic root: The aortic root was normal in size. - Mitral valve: Structurally normal valve. There was mild regurgitation. - Left atrium: The atrium was mildly dilated. - Right ventricle: Pacer wire or catheter noted in right ventricle. Systolic function was normal. - Right atrium: The atrium was moderately dilated. Pacer wire or catheter noted in right atrium. - Tricuspid valve: There was mild regurgitation. - Pulmonary arteries: Systolic pressure was mildly increased. PA peak pressure: 39 mm Hg (S). - Inferior vena cava: The vessel was dilated. The respirophasic diameter changes were blunted (< 50%),  consistent with elevated central venous pressure. - Pericardium, extracardiac: There was no pericardial effusion. Impressions: - When compared to the prior study from 05/31/14 the LVEF has improved, now 30-35% with diffuse hypokinesis.   03-27-16: EEG: This awake and drowsy EEG is abnormal due to the presence of: 1. Mild diffuse slowing of the waking background 2. Focal slowing over the left temporal region 3. Rare sharp waves over the left temporal region Clinical Correlation of the above findings indicates diffuse cerebral dysfunction that is non-specific in etiology and can be seen with hypoxic/ischemic  injury, toxic/metabolic encephalopathies, neurodegenerative disorders, or medication effect. Focal slowing over the left temporal region indicates focal cerebral dysfunction in this region suggestive of underlying structural or physiologic abnormality. There is a possible tendency for seizures to arise from this region. There were no electrographic seizures in this study. Clinical correlation is advised.  06-05-16: bilateral hip x-ray: 1. Degenerative changes lumbar spine and both hips. No acute abnormality. 2.  Aortoiliac atherosclerotic vascular disease.   06-05-16: ct of head and cervical spine: 1. No evidence of significant acute traumatic injury to the skull, brain or cervical spine. 2. Mild cerebral atrophy with extensive chronic microvascular ischemic changes, multiple lacunar infarcts in the basal ganglia and right thalamus, and old bilateral (left greater than right) cerebellar infarcts, as above. 3. Multilevel degenerative disc disease and cervical spondylosis, as above.  NO NEW EXAMS     LABS REVIEWED: PREVIOUS:  03-26-16: wbc 11.4 hgb 10.0; hct 34.5; mcv 71.7; plt 216; glucose 136; bun 16; creat 1.45; k+ 4.2; na++ 138; liver normal albumin 3.2 03-28-16: glucose 114; bun 20; creat 1.45; k+ 4.2; na++ 138  04-01-16: wbc 6.3; hgb 9.9; hct 35.4; mcv 75.0; plt 210; glucose 86; bun  17.8; creat 1.15; k+ 4.7; na++ 140; liver normal albumin 3.8; chol 157; ldl 67; trig 119; hdl 66 04-04-16: wbc 10.1; hgb 10.5; hct 37.3; mcv 75.4; plt 229; glucose 199; bun 19.6; creat 1.28; k+ 4.2; na++ 140  04-15-16: hgb a1c 6.9  05-11-16: wbc 9.4; hgb 9.8; hct 33.6; mcv 71.9; plt 198; glucose 190; bun 18; creat 1.44; k+ 4.5; na++ 135  07-31-16: wbc 5.6; hgb 10.4; hct 36.5; mcv 74.3; plt 236; glucose 74; bun 22.1; creat 1.19; k+ 4.9; na++ 143; liver normal albumin 3.8; hgb a1c 6.4  11-12-16: urine micro-albumin <1.2 12-24-16: wbc 6.0; hgb 9.6; hct 32.3; mcv 74.7; plt 239; glucose 74; bun 14.2; creat 1.11; k+ 4.7; na++ 141; ca 9.3; liver normal albumin 3.6  Hgb a1c 5.9; chol 173; ldl 86; trig 88; hdl 69  NO NEW LABS     Review of Systems  Reason unable to perform ROS: poor historian   Constitutional: Negative for malaise/fatigue.  Respiratory: Negative for cough.   Cardiovascular: Negative for chest pain.  Gastrointestinal: Negative for abdominal pain.  Musculoskeletal: Negative for myalgias.  Skin: Negative.   Neurological: Negative for dizziness.  Psychiatric/Behavioral: The patient is not nervous/anxious.       Physical Exam  Constitutional: No distress.  Skin: He is not diaphoretic.    Physical Exam  Constitutional: No distress.  Thin   Neck: Neck supple. No thyromegaly present.  Cardiovascular: Normal rate, regular rhythm and intact distal pulses.  Murmur heard. 1/6  Pulmonary/Chest: Effort normal and breath sounds normal. No respiratory distress.  Abdominal: Soft. Bowel sounds are normal. He exhibits no distension. There is no tenderness.  Musculoskeletal: Normal range of motion. He exhibits no edema.  Lymphadenopathy:    He has no cervical adenopathy.  Neurological: He is alert.  Skin: Skin is warm and dry. He is not diaphoretic.  Psychiatric: He has a normal mood and affect.   ASSESSMENT/ PLAN:  TODAY:  1. Chronic combined systolic and diastolic chf; 2. COPD:   3.  Cerebral infarction  Will not make changes to his plan of care He does have MOST form; reviewed; no family present to discuss advanced directives      MD is aware of resident's narcotic use and is in agreement with current plan of care. We will attempt to  wean resident as apropriate   Ok Edwards NP [facility] Adult Medicine  Contact [phone] Monday through Friday 8am- 5pm  After hours call [phone]

## 2017-03-20 LAB — HM DIABETES EYE EXAM

## 2017-03-21 ENCOUNTER — Encounter: Payer: Self-pay | Admitting: Adult Health

## 2017-03-21 ENCOUNTER — Non-Acute Institutional Stay (SKILLED_NURSING_FACILITY): Payer: Medicare Other | Admitting: Adult Health

## 2017-03-21 DIAGNOSIS — F0280 Dementia in other diseases classified elsewhere without behavioral disturbance: Secondary | ICD-10-CM

## 2017-03-21 DIAGNOSIS — J449 Chronic obstructive pulmonary disease, unspecified: Secondary | ICD-10-CM | POA: Diagnosis not present

## 2017-03-21 DIAGNOSIS — F0151 Vascular dementia with behavioral disturbance: Secondary | ICD-10-CM

## 2017-03-21 DIAGNOSIS — F329 Major depressive disorder, single episode, unspecified: Secondary | ICD-10-CM

## 2017-03-21 DIAGNOSIS — E1169 Type 2 diabetes mellitus with other specified complication: Secondary | ICD-10-CM

## 2017-03-21 DIAGNOSIS — F01518 Vascular dementia, unspecified severity, with other behavioral disturbance: Secondary | ICD-10-CM

## 2017-03-21 DIAGNOSIS — E785 Hyperlipidemia, unspecified: Secondary | ICD-10-CM | POA: Diagnosis not present

## 2017-03-21 DIAGNOSIS — I5042 Chronic combined systolic (congestive) and diastolic (congestive) heart failure: Secondary | ICD-10-CM | POA: Diagnosis not present

## 2017-03-21 DIAGNOSIS — E1142 Type 2 diabetes mellitus with diabetic polyneuropathy: Secondary | ICD-10-CM

## 2017-03-21 DIAGNOSIS — I2510 Atherosclerotic heart disease of native coronary artery without angina pectoris: Secondary | ICD-10-CM

## 2017-03-21 DIAGNOSIS — I2782 Chronic pulmonary embolism: Secondary | ICD-10-CM | POA: Diagnosis not present

## 2017-03-21 DIAGNOSIS — I69354 Hemiplegia and hemiparesis following cerebral infarction affecting left non-dominant side: Secondary | ICD-10-CM

## 2017-03-21 DIAGNOSIS — F0393 Unspecified dementia, unspecified severity, with mood disturbance: Secondary | ICD-10-CM

## 2017-03-21 DIAGNOSIS — N183 Chronic kidney disease, stage 3 unspecified: Secondary | ICD-10-CM

## 2017-03-21 DIAGNOSIS — E1122 Type 2 diabetes mellitus with diabetic chronic kidney disease: Secondary | ICD-10-CM

## 2017-03-21 DIAGNOSIS — I825Z9 Chronic embolism and thrombosis of unspecified deep veins of unspecified distal lower extremity: Secondary | ICD-10-CM

## 2017-03-21 NOTE — Progress Notes (Signed)
Provider:  Ok Edwards, NP Location:  [facility] Room Number: [place] of Service:  SNF (31)   PCP: Gildardo Cranker, DO Patient Care Team: Gildardo Cranker, DO as PCP - General (Internal Medicine) Ny[place] Cowden Phylis Bougie, NP as Nurse Practitioner (Geriatric Medicine) [facility], [place] ([place])  Extended Emergency Contact Information Primary Emergency Contact: Cromie,[name] Address: [address]          [place], [place] [address] [name] of [place] Phone: [phone] Mobile Phone: [phone] Relation: Spouse Secondary Emergency Contact: [name],[name]          [place], [place] Phone: [phone] Relation: Daughter  Code Status: Full Code Goals of Care: Advanced Directive information Advanced Directives 03/21/2017  Does Patient Have a Medical Advance Directive? Yes  Type of Advance Directive Out of facility DNR (pink MOST or yellow form)  Does patient want to make changes to medical advance directive? No - Patient declined  Copy of [place] in Chart? -  Would patient like information on creating a medical advance directive? -  Pre-existing out of facility DNR order (yellow form or pink MOST form) Pink MOST form placed in chart (order not valid for inpatient use)      Allergies  Allergen Reactions  . Tuberculin Tests Other (See Comments)    Per MAR (no description provided)     Chief Complaint  Patient presents with  . Annual Exam        HPI: Patient is a 76 y.o. male seen today for an annual comprehensive examination. He has not been hospitalized over the past year. He continues to get out of bed daily and does propel self around. He is unable to fully participate in the hpi or ros. There are no reports of shortness of breath; lower extremity edema; chest pain. He will continue to be followed by his chronic illnesses including: chronic combined chf; pulmonary embolis; copd.  There are no nursing concerns at this time.   Past Medical History:  Diagnosis Date  . AICD (automatic cardioverter/defibrillator) present   . Arthritis    "used to have a touch in my legs"  . Bradycardia 06/01/2014  . CAD (coronary artery disease)    LHC 9/05 with Dr. Einar Gip:  dLM 20-30%, LAD 85%, oD1 20-30%.  PCI:  Taxus DES to LAD; Dx jailed and tx with POBA.  Last myoview 12/10: inf scar, no ischemia, EF 29%.  . CHF (congestive heart failure) ([place])   . Crohn's disease ([place])   . Dementia   . Diabetes mellitus    11/05/11 "borderline; don't take medications"  . Dilated cardiomyopathy ([place])    2/12: EF 30-35%, trivial AI, mild RAE.  EF 2014 40 -45%  . DVT (deep venous thrombosis) ([place])   . HCAP (healthcare-associated pneumonia) 09/29/2015  . HLD (hyperlipidemia)   . 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 Hyperlipidemia   . Hypertension   . Memory difficulties   . Nephrolithiasis   . PFO (patent foramen ovale)    Not mentioned on 2014 echo.  . Pneumonia ~ 2011   09-22-13 denies any recent SOB or breathing problems  . Pulmonary embolus (HCC)    chronic coumadin  . Stroke [facility]) 1993   "left arm can't hold steady; leg too"  . Swelling of both ankles     09-22-13 occ.feet, but denies pain.   Past Surgical History:  Procedure Laterality Date  . APPENDECTOMY    . May;  1996   "for Crohn's disease"  . IMPLANTABLE CARDIOVERTER DEFIBRILLATOR IMPLANT N/A 06/02/2014   Procedure: IMPLANTABLE CARDIOVERTER DEFIBRILLATOR IMPLANT;  Surgeon: [name], MD;  Location: [facility] CATH LAB;  Service: Cardiovascular;  Laterality: N/A;    reports that he has been smoking cigarettes.  He has a 26.50 pack-year smoking history. he has never used smokeless tobacco. He reports that he does not drink alcohol or use drugs. Social History   Socioeconomic History  . Marital status: Married    Spouse name: Not on file  . Number of children: Not on file  . Years of education: Not on file  . Highest  education level: Not on file  Social Needs  . Financial resource strain: Not on file  . Food insecurity - worry: Not on file  . Food insecurity - inability: Not on file  . Transportation needs - medical: Not on file  . Transportation needs - non-medical: Not on file  Occupational History  . Not on file  Tobacco Use  . Smoking status: Current Some Day Smoker    Packs/day: 0.50    Years: 53.00    Pack years: 26.50    Types: Cigarettes  . Smokeless tobacco: Never Used  Substance and Sexual Activity  . Alcohol use: No    Alcohol/week: 0.0 oz  . Drug use: No  . Sexual activity: Not Currently  Other Topics Concern  . Not on file  Social History Narrative  . Not on file   Family History  Problem Relation Age of Onset  . Diabetes type II Mother   . CAD Father   . Diabetes type II Brother     Vitals:   03/21/17 1036  BP: (!) 147/88  Pulse: 70  Resp: 16  Temp: 97.9 F (36.6 C)  SpO2: 95%  Weight: 121 lb 14.4 oz (55.3 kg)  Height: 5' 3"  (1.6 m)   Body mass index is 21.59 kg/m.  Allergies as of 03/21/2017      Reactions   Tuberculin Tests Other (See Comments)   Per MAR (no description provided)      Medication List        Accurate as of 03/21/17 10:51 AM. Always use your most recent med list.          aspirin 81 MG tablet Take 81 mg by mouth daily.   donepezil 10 MG tablet Commonly known as:  ARICEPT Take 10 mg by mouth at bedtime.   gabapentin 100 MG capsule Commonly known as:  NEURONTIN Take 100 mg by mouth at bedtime.   latanoprost 0.005 % ophthalmic solution Commonly known as:  XALATAN Place 1 drop into both eyes at bedtime.   levETIRAcetam 500 MG tablet Commonly known as:  KEPPRA Take 1 tablet (500 mg total) by mouth 2 (two) times daily.   NUTRITIONAL SUPPLEMENT PO Med Pass - Give 120 ml by mouth two times daily for Health Supplement   NUTRITIONAL SUPPLEMENT PO House supplement - House Shake - Give 4 oz shakes at meals   polyvinyl alcohol  1.4 % ophthalmic solution Commonly known as:  LIQUIFILM TEARS Place 1 drop into both eyes 2 (two) times daily.   rivaroxaban 20 MG Tabs tablet Commonly known as:  XARELTO Take 20 mg by mouth at bedtime.   senna 8.6 MG Tabs tablet Commonly known as:  SENOKOT Take 1 tablet by mouth every morning.   SERTRALINE HCL PO Take 75 mg by mouth every morning.   tamsulosin 0.4 MG Caps  capsule Commonly known as:  FLOMAX Take 0.4 mg by mouth at bedtime.   traMADol 50 MG tablet Commonly known as:  ULTRAM Take 1 tablet (50 mg total) 2 (two) times daily by mouth.   traZODone 50 MG tablet Commonly known as:  DESYREL Take 25 mg by mouth at bedtime.        SIGNIFICANT DIAGNOSTIC EXAMS  PREVIOUS  10-06-15: 2-d echo:   - Left ventricle: The cavity size was normal. Systolic function was moderately to severely reduced. The estimated ejection fraction was in the range of 30% to 35%. Diffuse hypokinesis. Doppler parameters are consistent with abnormal left ventricular relaxation (grade 1 diastolic dysfunction). There was no evidence of elevated ventricular filling pressure by Doppler parameters. - Aortic valve: There was mild regurgitation. - Aortic root: The aortic root was normal in size. - Mitral valve: Structurally normal valve. There was mild regurgitation. - Left atrium: The atrium was mildly dilated. - Right ventricle: Pacer wire or catheter noted in right ventricle. Systolic function was normal. - Right atrium: The atrium was moderately dilated. Pacer wire or catheter noted in right atrium. - Tricuspid valve: There was mild regurgitation. - Pulmonary arteries: Systolic pressure was mildly increased. PA peak pressure: 39 mm Hg (S). - Inferior vena cava: The vessel was dilated. The respirophasic diameter changes were blunted (< 50%), consistent with elevated central venous pressure. - Pericardium, extracardiac: There was no pericardial effusion. Impressions: - When compared to the prior  study from 05/31/14 the LVEF has improved, now 30-35% with diffuse hypokinesis.   03-27-16: EEG: This awake and drowsy EEG is abnormal due to the presence of: 1. Mild diffuse slowing of the waking background 2. Focal slowing over the left temporal region 3. Rare sharp waves over the left temporal region Clinical Correlation of the above findings indicates diffuse cerebral dysfunction that is non-specific in etiology and can be seen with hypoxic/ischemic injury, toxic/metabolic encephalopathies, neurodegenerative disorders, or medication effect. Focal slowing over the left temporal region indicates focal cerebral dysfunction in this region suggestive of underlying structural or physiologic abnormality. There is a possible tendency for seizures to arise from this region. There were no electrographic seizures in this study. Clinical correlation is advised.  06-05-16: bilateral hip x-ray: 1. Degenerative changes lumbar spine and both hips. No acute abnormality. 2.  Aortoiliac atherosclerotic vascular disease.   06-05-16: ct of head and cervical spine: 1. No evidence of significant acute traumatic injury to the skull, brain or cervical spine. 2. Mild cerebral atrophy with extensive chronic microvascular ischemic changes, multiple lacunar infarcts in the basal ganglia and right thalamus, and old bilateral (left greater than right) cerebellar infarcts, as above. 3. Multilevel degenerative disc disease and cervical spondylosis, as above.  NO NEW EXAMS     LABS REVIEWED: PREVIOUS:  03-26-16: wbc 11.4 hgb 10.0; hct 34.5; mcv 71.7; plt 216; glucose 136; bun 16; creat 1.45; k+ 4.2; na++ 138; liver normal albumin 3.2 03-28-16: glucose 114; bun 20; creat 1.45; k+ 4.2; na++ 138  04-01-16: wbc 6.3; hgb 9.9; hct 35.4; mcv 75.0; plt 210; glucose 86; bun 17.8; creat 1.15; k+ 4.7; na++ 140; liver normal albumin 3.8; chol 157; ldl 67; trig 119; hdl 66 04-04-16: wbc 10.1; hgb 10.5; hct 37.3; mcv 75.4; plt 229;  glucose 199; bun 19.6; creat 1.28; k+ 4.2; na++ 140  04-15-16: hgb a1c 6.9  05-11-16: wbc 9.4; hgb 9.8; hct 33.6; mcv 71.9; plt 198; glucose 190; bun 18; creat 1.44; k+ 4.5; na++ 135  07-31-16: wbc  5.6; hgb 10.4; hct 36.5; mcv 74.3; plt 236; glucose 74; bun 22.1; creat 1.19; k+ 4.9; na++ 143; liver normal albumin 3.8; hgb a1c 6.4  11-12-16: urine micro-albumin <1.2 12-24-16: wbc 6.0; hgb 9.6; hct 32.3; mcv 74.7; plt 239; glucose 74; bun 14.2; creat 1.11; k+ 4.7; na++ 141; ca 9.3; liver normal albumin 3.6  Hgb a1c 5.9; chol 173; ldl 86; trig 88; hdl 69  NO NEW LABS     Review of Systems  Reason unable to perform ROS: poor historian   Respiratory: Negative for cough.   Cardiovascular: Negative for chest pain.  Gastrointestinal: Negative for abdominal pain.  Musculoskeletal: Negative for back pain.  Psychiatric/Behavioral: The patient is not nervous/anxious.    Physical Exam  Constitutional: No distress.  Thin   Neck: No thyromegaly present.  Cardiovascular: Normal rate, regular rhythm and intact distal pulses.  Murmur heard. 1/6  Pulmonary/Chest: Effort normal and breath sounds normal. No respiratory distress.  Abdominal: Soft. Bowel sounds are normal. He exhibits no distension. There is no tenderness.  Musculoskeletal: Normal range of motion. He exhibits no edema.  Lymphadenopathy:    He has no cervical adenopathy.  Neurological: He is alert.  Skin: Skin is warm and dry. He is not diaphoretic.  Psychiatric: He has a normal mood and affect.    ASSESSMENT/ PLAN:  TODAY  1. Seizures: stable no reports of recent seizure activities; will continue keppra 500 mg twice daily and will monitor  2. Diabetes: stable  hgb a1c 5.9 currently not on medications; unable to tolerate ace due to low blood pressure   3.  Bilateral lower extremity pain: stable  Continue  tylenol  1 gm twice daily and ultram 50 mg twice daily  4.  Dyslipidemia: ldl is 67 is stable his lipitor was stopped due to weight  loss.   5. Dementia: without significant change; his current weight is 121 pounds previous weight 122 pounds is presently stable; will continue aricept 10 mg nightly   6  BPH: stable  will continue flomax 0.4 mg daily   7. Chronic diastoic heart failure: EF 30-35% (10-06-15) is status post ICD placement (interrogated; now pace maker function only) is stable   is currently not on medications; will not make changes will monitor  8. CAD: no indications of chest pain present; will continue asa 81 mg daily  9. COPD stable no currently on routine medications   will not make changes will monitor his status.   10.Depression: stable will continue zoloft 75 mg daily and trazodone 50 mg nightly GDR: done 03-05-17  11. CKD stage TGY:BWLSLHT change  bun/creat 14.2/1.11  12. 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 Glaucoma: stable  will continue xalatan to both eyes.   15. Crohn's disease: has constipation: stable  will continue senna daily   16. Hypotension: b/p 147/88  stable  is currently not on medications; will not make changes will monitor   His health care maintenance is up to date.   Time spent with patient: 40 minutes: reviewing medical record; lab work; discussing with staff his overall status.      MD is aware of resident's narcotic use and is in agreement with current plan of care. We will wean dosage as appropriate for resident   Ok Edwards NP [facility]  Contact [phone] Monday through Friday 8am- 5pm  After hours call  [phone]   

## 2017-03-28 ENCOUNTER — Other Ambulatory Visit: Payer: Self-pay

## 2017-03-28 MED ORDER — TRAMADOL HCL 50 MG PO TABS
50.0000 mg | ORAL_TABLET | Freq: Two times a day (BID) | ORAL | 0 refills | Status: DC
Start: 2017-03-28 — End: 2017-04-28

## 2017-03-28 NOTE — Telephone Encounter (Signed)
RX faxed to [employer] [facility] # [phone] Phone #1- [phone]

## 2017-04-17 ENCOUNTER — Non-Acute Institutional Stay (SKILLED_NURSING_FACILITY): Payer: Medicare Other | Admitting: Internal Medicine

## 2017-04-17 ENCOUNTER — Encounter: Payer: Self-pay | Admitting: Internal Medicine

## 2017-04-17 DIAGNOSIS — K50919 Crohn's disease, unspecified, with unspecified complications: Secondary | ICD-10-CM

## 2017-04-17 DIAGNOSIS — N183 Chronic kidney disease, stage 3 unspecified: Secondary | ICD-10-CM

## 2017-04-17 DIAGNOSIS — Z8673 Personal history of transient ischemic attack (TIA), and cerebral infarction without residual deficits: Secondary | ICD-10-CM

## 2017-04-17 DIAGNOSIS — F01518 Vascular dementia, unspecified severity, with other behavioral disturbance: Secondary | ICD-10-CM

## 2017-04-17 DIAGNOSIS — I69354 Hemiplegia and hemiparesis following cerebral infarction affecting left non-dominant side: Secondary | ICD-10-CM | POA: Diagnosis not present

## 2017-04-17 DIAGNOSIS — F0151 Vascular dementia with behavioral disturbance: Secondary | ICD-10-CM

## 2017-04-17 DIAGNOSIS — I5042 Chronic combined systolic (congestive) and diastolic (congestive) heart failure: Secondary | ICD-10-CM | POA: Diagnosis not present

## 2017-04-17 DIAGNOSIS — E1122 Type 2 diabetes mellitus with diabetic chronic kidney disease: Secondary | ICD-10-CM | POA: Diagnosis not present

## 2017-04-17 DIAGNOSIS — I2782 Chronic pulmonary embolism: Secondary | ICD-10-CM

## 2017-04-17 NOTE — Progress Notes (Signed)
Patient ID: Grant Lara, male   DOB: 12-Aug-1940, 77 y.o.   MRN: [id number]  Location:  [facility] Room Number: [place] of Service:  SNF (31) Provider:  DR Daziya Redmond Gwendalyn Ege, DO  Patient Care Team: Gildardo Cranker, DO as PCP - General (Internal Medicine) [facility], [place] ([place])  Extended Emergency Contact Information Primary Emergency Contact: Greeley,[name] Address: [address]          [place], [place] [address] [name] of [place] Phone: [phone] Mobile Phone: [phone] Relation: Spouse Secondary Emergency Contact: [name],[name]          [place], [place] Phone: [phone] Relation: Daughter  Code Status:  DNR Goals of care: Advanced Directive information Advanced Directives 04/17/2017  Does Patient Have a Medical Advance Directive? Yes  Type of Advance Directive Out of facility DNR (pink MOST or yellow form)  Does patient want to make changes to medical advance directive? No - Patient declined  Copy of [place] in Chart? -  Would patient like information on creating a medical advance directive? -  Pre-existing out of facility DNR order (yellow form or pink MOST form) Pink MOST form placed in chart (order not valid for inpatient use)     Chief Complaint  Patient presents with  . Medical Management of Chronic Issues    Routine- OPTUM    HPI:  Pt is a 77 y.o. male seen today for medical management of chronic diseases.  Today he reports no concerns. No recent falls. No CP, SOB, f/c. No constipation. Appetite is excellent and he sleeps well. He is a poor historian due to dementia. Hx obtained from chart.  Seizure d/o - stable on keppra 500 mg twice daily; no recent sz activity  DM - diet controlled. A1c 5.9%; not on ACEI/ARB 2/2 low BPs  Bilateral lower extremity pain - stable on tylenol  1 gm twice daily and ultram 50 mg twice  daily  Dyslipidemia - stable off lipitor 2/2 weight loss; LDL 67   Dementia - stable on aricept 10 mg nightly. 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 Stable without medication. He takes ASA daily  CAD - no angina. Stable on ASA 81 mg daily  COPD - stable without medication. No recent exacerbation   Depression - mood stable on zoloft 75 mg daily; trazodone 50 mg nightly; GDR done 03-05-17  CKD  - stage 3. Stable. 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   Crohn's disease - he has constipation and stable on senna daily   Hypotension - improved. Current BP 124/68     Past Medical History:  Diagnosis Date  . AICD (automatic cardioverter/defibrillator) present   . Arthritis    "used to have a touch in my legs"  . Bradycardia 06/01/2014  . CAD (coronary artery disease)    LHC 9/05 with Dr. Einar Gip:  dLM 20-30%, LAD 85%, oD1 20-30%.  PCI:  Taxus DES to LAD; Dx jailed and tx with POBA.  Last myoview 12/10: inf scar, no ischemia, EF 29%.  . CHF (congestive heart failure) ([place])   . Crohn's disease ([place])   .  Dementia   . Diabetes mellitus    11/05/11 "borderline; don't take medications"  . Dilated cardiomyopathy ([place])    2/12: EF 30-35%, trivial AI, mild RAE.  EF 2014 40 -45%  . DVT (deep venous thrombosis) ([place])   . HCAP (healthcare-associated pneumonia) 09/29/2015  . HLD (hyperlipidemia)   . 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 Hyperlipidemia   . Hypertension   . Memory difficulties   . Nephrolithiasis   . PFO (patent foramen ovale)    Not mentioned on 2014 echo.  . Pneumonia ~ 2011   09-22-13 denies any recent SOB or breathing problems  . Pulmonary embolus (HCC)    chronic coumadin  . Stroke [facility]) 1993   "left arm can't hold steady;  leg too"  . Swelling of both ankles     09-22-13 occ.feet, but denies pain.   Past Surgical History:  Procedure Laterality Date  . APPENDECTOMY    . COLON SURGERY  1994; 1996   "for Crohn's disease"  . IMPLANTABLE CARDIOVERTER DEFIBRILLATOR IMPLANT N/A 06/02/2014   Procedure: IMPLANTABLE CARDIOVERTER DEFIBRILLATOR IMPLANT;  Surgeon: [name], MD;  [facility] C[facility] LAB;  Service: Cardiovascular;  Laterality: N/A;    Allergies  Allergen Reactions  . Tuberculin Tests Other (See Comments)    Per MAR (no description provided)    Outpatient Encounter Medications as of 04/17/2017  Medication Sig  . aspirin 81 MG tablet Take 81 mg by mouth daily.  [name] donepezil (ARICEPT) 10 MG tablet Take 10 mg by mouth at bedtime.   . gabapentin (NEURONTIN) 100 MG capsule Take 100 mg by mouth at bedtime.  [name] latanoprost (XALATAN) 0.005 % ophthalmic solution Place 1 drop into both eyes at bedtime.  . levETIRAcetam (KEPPRA) 500 MG tablet Take 1 tablet (500 mg total) by mouth 2 (two) times daily.  . Nutritional Supplements (NUTRITIONAL SUPPLEMENT PO) Med Pass - Give 120 ml by mouth two times daily for Health Supplement  . Nutritional Supplements (NUTRITIONAL SUPPLEMENT PO) House supplement - House Shake - Give 4 oz shakes at meals  . polyvinyl alcohol (LIQUIFILM TEARS) 1.4 % ophthalmic solution Place 1 drop into both eyes 2 (two) times daily.  . rivaroxaban (XARELTO) 20 MG TABS tablet Take 20 mg by mouth at bedtime.  . senna (SENOKOT) 8.6 MG TABS tablet Take 1 tablet by mouth every morning.  [name] SERTRALINE HCL PO Take 75 mg by mouth every morning.   . tamsulosin (FLOMAX) 0.4 MG CAPS capsule Take 0.4 mg by mouth at bedtime.  . traMADol (ULTRAM) 50 MG tablet Take 1 tablet (50 mg total) by mouth 2 (two) times daily.  . [DISCONTINUED] traZODone (DESYREL) 50 MG tablet Take 25 mg by mouth at bedtime.   No facility-administered encounter medications on file as of 04/17/2017.     Review of Systems  Unable to  perform ROS: Dementia    Immunization History  Administered Date(s) Administered  . Influenza Whole 02/09/2010  . Influenza-Unspecified 01/21/2016  . PPD Test 11/05/2011, 10/09/2015  . Pneumococcal Polysaccharide-23 02/19/2010  . Tdap 06/05/2016   Pertinent  Health Maintenance Due  Topic Date Due  . INFLUENZA VACCINE  11/21/2017 (Originally 11/13/2016)  . PNA vac Low Risk Adult (2 of 2 - PCV13) 04/16/2023 (Originally 02/20/2011)  . HEMOGLOBIN A1C  06/23/2017  . URINE MICROALBUMIN  11/12/2017  . FOOT EXAM  12/23/2017  . OPHTHALMOLOGY EXAM  03/20/2018   Fall Risk  11/11/2016  Falls in the past year? Yes  Number falls in past yr: 2  or more  Injury with Fall? Yes   Functional Status Survey:    Vitals:   04/17/17 1303  BP: 124/68  Pulse: 89  Resp: 18  Temp: 98.1 F (36.7 C)  TempSrc: Oral  SpO2: 98%  Weight: 121 lb 12.8 oz (55.2 kg)  Height: 5' 3"  (1.6 m)   Body mass index is 21.58 kg/m. Physical Exam  Constitutional: He appears well-developed and well-nourished.  Looks well in NAD, lying in bed  HENT:  Mouth/Throat: Oropharynx is clear and moist.  MMM; no oral thrush  Eyes: Pupils are equal, round, and reactive to light. No scleral icterus.  Neck: Neck supple. Carotid bruit is not present. No thyromegaly present.  Cardiovascular: Normal rate, regular rhythm and intact distal pulses.  Occasional extrasystoles are present. Exam reveals no gallop and no friction rub.  Murmur (1/6 SEM) heard. No distal LE edema. No calf TTP  Pulmonary/Chest: Effort normal and breath sounds normal. He has no wheezes. He has no rales. He exhibits no tenderness.  Abdominal: Soft. Normal appearance and bowel sounds are normal. He exhibits no distension, no abdominal bruit, no pulsatile midline mass and no mass. There is no hepatomegaly. There is no tenderness. There is no rigidity, no rebound and no guarding. No hernia.  Musculoskeletal: Edema: small joints.  Lymphadenopathy:    He has no  cervical adenopathy.  Neurological: He is alert.  Skin: Skin is warm and dry. No rash noted.  Psychiatric: He has a normal mood and affect. His behavior is normal. Thought content normal.    Labs reviewed: Recent Labs    05/11/16 1739 07/15/16 07/31/16 12/24/16  NA 135 144 143 141  K 4.5 4.5 4.9 4.7  CL 104  --   --   --   CO2 24  --   --   --   GLUCOSE 190*  --   --   --   BUN 18 21 22* 14  CREATININE 1.44* 1.3 1.2 1.1  CALCIUM 9.4  --   --   --    Recent Labs    07/15/16 07/31/16 12/24/16  AST 16 14 12*  ALT 14 11 8*  ALKPHOS 94 100 95   Recent Labs    05/11/16 1739 07/15/16 07/31/16 12/24/16  WBC 9.4 6.9 5.6 6.0  NEUTROABS  --  4 2 3   HGB 9.8* 9.5* 10.4* 9.6*  HCT 33.6* 33* 37* 32*  MCV 71.9*  --   --   --   PLT 198 219 236 239   Lab Results  Component Value Date   TSH 1.64 09/15/2015   Lab Results  Component Value Date   HGBA1C 6.4 07/31/2016   Lab Results  Component Value Date   CHOL 157 04/01/2016   HDL 66 04/01/2016   LDLCALC 67 04/01/2016   TRIG 119 04/01/2016   CHOLHDL 3.1 04/16/2014    Significant Diagnostic Results in last 30 days:  No results found.  Assessment/Plan   ICD-10-CM   1. Type 2 diabetes mellitus with stage 3 chronic kidney disease, without long-term current use of insulin (HCC) E11.22    N18.3   2. Other chronic pulmonary embolism without acute cor pulmonale (HCC) I27.82   3. Chronic combined systolic and diastolic CHF (congestive heart failure) (HCC) I50.42   4. 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 Hemiparesis affecting left side as late effect of cerebrovascular accident (CVA) ([place]) I69.354   6. Crohn's disease with complication, unspecified gastrointestinal tract location [facility]) X41.287  7. History of stroke Z86.73      Cont current meds as ordered  PT/OT/ST as indicated  Nutritional supplements as ordered  OPTUM Np to follow   Will follow  Labs/tests ordered: none    Delois Tolbert S. Perlie Gold  [facility] [address] [place], [place] [address] [phone] Cell (Monday-Friday 8 AM - 5 PM) [phone] After 5 PM and follow prompts

## 2017-04-24 ENCOUNTER — Encounter (HOSPITAL_COMMUNITY): Payer: Self-pay | Admitting: Emergency Medicine

## 2017-04-24 ENCOUNTER — Emergency Department (HOSPITAL_COMMUNITY): Payer: Medicare Other

## 2017-04-24 ENCOUNTER — Observation Stay (HOSPITAL_COMMUNITY): Payer: Medicare Other

## 2017-04-24 ENCOUNTER — Observation Stay (HOSPITAL_COMMUNITY)
Admission: EM | Admit: 2017-04-24 | Discharge: 2017-04-25 | Disposition: A | Discharge status: Skilled Nursing Facility | Payer: Medicare Other | Attending: Family Medicine | Admitting: Family Medicine

## 2017-04-24 DIAGNOSIS — Z681 Body mass index (BMI) 19 or less, adult: Secondary | ICD-10-CM | POA: Insufficient documentation

## 2017-04-24 DIAGNOSIS — E1142 Type 2 diabetes mellitus with diabetic polyneuropathy: Secondary | ICD-10-CM | POA: Diagnosis not present

## 2017-04-24 DIAGNOSIS — Z7901 Long term (current) use of anticoagulants: Secondary | ICD-10-CM | POA: Insufficient documentation

## 2017-04-24 DIAGNOSIS — I255 Ischemic cardiomyopathy: Secondary | ICD-10-CM | POA: Insufficient documentation

## 2017-04-24 DIAGNOSIS — E43 Unspecified severe protein-calorie malnutrition: Secondary | ICD-10-CM | POA: Insufficient documentation

## 2017-04-24 DIAGNOSIS — I2510 Atherosclerotic heart disease of native coronary artery without angina pectoris: Secondary | ICD-10-CM | POA: Diagnosis not present

## 2017-04-24 DIAGNOSIS — R55 Syncope and collapse: Secondary | ICD-10-CM | POA: Diagnosis present

## 2017-04-24 DIAGNOSIS — I420 Dilated cardiomyopathy: Secondary | ICD-10-CM | POA: Diagnosis not present

## 2017-04-24 DIAGNOSIS — Z7982 Long term (current) use of aspirin: Secondary | ICD-10-CM | POA: Insufficient documentation

## 2017-04-24 DIAGNOSIS — I959 Hypotension, unspecified: Secondary | ICD-10-CM | POA: Insufficient documentation

## 2017-04-24 DIAGNOSIS — I5042 Chronic combined systolic (congestive) and diastolic (congestive) heart failure: Secondary | ICD-10-CM | POA: Diagnosis not present

## 2017-04-24 DIAGNOSIS — D638 Anemia in other chronic diseases classified elsewhere: Secondary | ICD-10-CM | POA: Diagnosis present

## 2017-04-24 DIAGNOSIS — Z887 Allergy status to serum and vaccine status: Secondary | ICD-10-CM | POA: Diagnosis not present

## 2017-04-24 DIAGNOSIS — R569 Unspecified convulsions: Secondary | ICD-10-CM | POA: Insufficient documentation

## 2017-04-24 DIAGNOSIS — Z95810 Presence of automatic (implantable) cardiac defibrillator: Secondary | ICD-10-CM | POA: Insufficient documentation

## 2017-04-24 DIAGNOSIS — Z86711 Personal history of pulmonary embolism: Secondary | ICD-10-CM | POA: Insufficient documentation

## 2017-04-24 DIAGNOSIS — I130 Hypertensive heart and chronic kidney disease with heart failure and stage 1 through stage 4 chronic kidney disease, or unspecified chronic kidney disease: Secondary | ICD-10-CM | POA: Diagnosis not present

## 2017-04-24 DIAGNOSIS — N170 Acute kidney failure with tubular necrosis: Secondary | ICD-10-CM

## 2017-04-24 DIAGNOSIS — E1122 Type 2 diabetes mellitus with diabetic chronic kidney disease: Secondary | ICD-10-CM | POA: Diagnosis not present

## 2017-04-24 DIAGNOSIS — N179 Acute kidney failure, unspecified: Secondary | ICD-10-CM | POA: Diagnosis not present

## 2017-04-24 DIAGNOSIS — F0390 Unspecified dementia without behavioral disturbance: Secondary | ICD-10-CM | POA: Insufficient documentation

## 2017-04-24 DIAGNOSIS — W19XXXA Unspecified fall, initial encounter: Secondary | ICD-10-CM | POA: Diagnosis not present

## 2017-04-24 DIAGNOSIS — I950 Idiopathic hypotension: Secondary | ICD-10-CM

## 2017-04-24 DIAGNOSIS — R7989 Other specified abnormal findings of blood chemistry: Secondary | ICD-10-CM | POA: Diagnosis not present

## 2017-04-24 DIAGNOSIS — J449 Chronic obstructive pulmonary disease, unspecified: Secondary | ICD-10-CM | POA: Diagnosis not present

## 2017-04-24 DIAGNOSIS — M542 Cervicalgia: Secondary | ICD-10-CM | POA: Insufficient documentation

## 2017-04-24 DIAGNOSIS — Z955 Presence of coronary angioplasty implant and graft: Secondary | ICD-10-CM | POA: Insufficient documentation

## 2017-04-24 DIAGNOSIS — K5090 Crohn's disease, unspecified, without complications: Secondary | ICD-10-CM | POA: Insufficient documentation

## 2017-04-24 DIAGNOSIS — R8290 Unspecified abnormal findings in urine: Secondary | ICD-10-CM | POA: Insufficient documentation

## 2017-04-24 DIAGNOSIS — I2699 Other pulmonary embolism without acute cor pulmonale: Secondary | ICD-10-CM | POA: Diagnosis present

## 2017-04-24 DIAGNOSIS — Z79899 Other long term (current) drug therapy: Secondary | ICD-10-CM | POA: Insufficient documentation

## 2017-04-24 LAB — COMPREHENSIVE METABOLIC PANEL
ALK PHOS: 81 U/L (ref 38–126)
ALT: 13 U/L — AB (ref 17–63)
ANION GAP: 12 (ref 5–15)
AST: 26 U/L (ref 15–41)
Albumin: 3 g/dL — ABNORMAL LOW (ref 3.5–5.0)
BILIRUBIN TOTAL: 0.5 mg/dL (ref 0.3–1.2)
BUN: 36 mg/dL — ABNORMAL HIGH (ref 6–20)
CALCIUM: 8.3 mg/dL — AB (ref 8.9–10.3)
CO2: 20 mmol/L — ABNORMAL LOW (ref 22–32)
CREATININE: 5 mg/dL — AB (ref 0.61–1.24)
Chloride: 103 mmol/L (ref 101–111)
GFR calc non Af Amer: 10 mL/min — ABNORMAL LOW (ref >60)
GFR, EST AFRICAN AMERICAN: 12 mL/min — AB (ref >60)
GLUCOSE: 149 mg/dL — AB (ref 65–99)
Potassium: 4.4 mmol/L (ref 3.5–5.1)
Sodium: 135 mmol/L (ref 135–145)
TOTAL PROTEIN: 6.6 g/dL (ref 6.5–8.1)

## 2017-04-24 LAB — CBC WITH DIFFERENTIAL/PLATELET
BASOS PCT: 0 %
Basophils Absolute: 0 10*3/uL (ref 0.0–0.1)
Basophils Absolute: 0 10*3/uL (ref 0.0–0.1)
Basophils Relative: 0 %
EOS ABS: 0.4 10*3/uL (ref 0.0–0.7)
EOS PCT: 0 %
Eosinophils Absolute: 0 10*3/uL (ref 0.0–0.7)
Eosinophils Relative: 4 %
HCT: 32.9 % — ABNORMAL LOW (ref 39.0–52.0)
HCT: 29.6 % — ABNORMAL LOW (ref 39.0–52.0)
Hemoglobin: 9.7 g/dL — ABNORMAL LOW (ref 13.0–17.0)
Hemoglobin: 8.4 g/dL — ABNORMAL LOW (ref 13.0–17.0)
Lymphocytes Relative: 8 %
Lymphocytes Relative: 17 %
Lymphs Abs: 0.9 10*3/uL (ref 0.7–4.0)
Lymphs Abs: 1.8 10*3/uL (ref 0.7–4.0)
MCH: 21.6 pg — AB (ref 26.0–34.0)
MCH: 20.5 pg — AB (ref 26.0–34.0)
MCHC: 29.5 g/dL — AB (ref 30.0–36.0)
MCHC: 28.4 g/dL — AB (ref 30.0–36.0)
MCV: 73.1 fL — AB (ref 78.0–100.0)
MCV: 72.4 fL — ABNORMAL LOW (ref 78.0–100.0)
MONO ABS: 0.7 10*3/uL (ref 0.1–1.0)
MONO ABS: 0.7 10*3/uL (ref 0.1–1.0)
Monocytes Relative: 6 %
Monocytes Relative: 7 %
NEUTROS ABS: 9.6 10*3/uL — AB (ref 1.7–7.7)
Neutro Abs: 7.5 10*3/uL (ref 1.7–7.7)
Neutrophils Relative %: 86 %
Neutrophils Relative %: 72 %
PLATELETS: 229 10*3/uL (ref 150–400)
PLATELETS: 213 10*3/uL (ref 150–400)
RBC: 4.5 MIL/uL (ref 4.22–5.81)
RBC: 4.09 MIL/uL — ABNORMAL LOW (ref 4.22–5.81)
RDW: 17.5 % — AB (ref 11.5–15.5)
RDW: 17.2 % — ABNORMAL HIGH (ref 11.5–15.5)
WBC: 11.2 10*3/uL — AB (ref 4.0–10.5)
WBC: 10.4 10*3/uL (ref 4.0–10.5)

## 2017-04-24 LAB — BASIC METABOLIC PANEL
Anion gap: 8 (ref 5–15)
BUN: 30 mg/dL — AB (ref 6–20)
CALCIUM: 7.9 mg/dL — AB (ref 8.9–10.3)
CO2: 20 mmol/L — ABNORMAL LOW (ref 22–32)
CREATININE: 2.47 mg/dL — AB (ref 0.61–1.24)
Chloride: 108 mmol/L (ref 101–111)
GFR calc Af Amer: 27 mL/min — ABNORMAL LOW (ref >60)
GFR, EST NON AFRICAN AMERICAN: 23 mL/min — AB (ref >60)
Glucose, Bld: 184 mg/dL — ABNORMAL HIGH (ref 65–99)
Potassium: 4.5 mmol/L (ref 3.5–5.1)
Sodium: 136 mmol/L (ref 135–145)

## 2017-04-24 LAB — URINALYSIS, ROUTINE W REFLEX MICROSCOPIC
Glucose, UA: NEGATIVE mg/dL
Ketones, ur: 15 mg/dL — AB
Nitrite: NEGATIVE
Protein, ur: NEGATIVE mg/dL
pH: 5 (ref 5.0–8.0)

## 2017-04-24 LAB — GLUCOSE, CAPILLARY: GLUCOSE-CAPILLARY: 122 mg/dL — AB (ref 65–99)

## 2017-04-24 LAB — CBG MONITORING, ED
GLUCOSE-CAPILLARY: 59 mg/dL — AB (ref 65–99)
GLUCOSE-CAPILLARY: 84 mg/dL (ref 65–99)
Glucose-Capillary: 102 mg/dL — ABNORMAL HIGH (ref 65–99)
Glucose-Capillary: 63 mg/dL — ABNORMAL LOW (ref 65–99)

## 2017-04-24 LAB — MAGNESIUM: MAGNESIUM: 1.7 mg/dL (ref 1.7–2.4)

## 2017-04-24 LAB — PROTIME-INR
INR: 2.17
Prothrombin Time: 24 seconds — ABNORMAL HIGH (ref 11.4–15.2)

## 2017-04-24 LAB — URINALYSIS, MICROSCOPIC (REFLEX)

## 2017-04-24 LAB — HEPATIC FUNCTION PANEL
ALK PHOS: 69 U/L (ref 38–126)
ALT: 17 U/L (ref 17–63)
AST: 40 U/L (ref 15–41)
Albumin: 2.6 g/dL — ABNORMAL LOW (ref 3.5–5.0)
Bilirubin, Direct: <0.1 mg/dL — ABNORMAL LOW (ref 0.1–0.5)
TOTAL PROTEIN: 5.8 g/dL — AB (ref 6.5–8.1)
Total Bilirubin: 0.6 mg/dL (ref 0.3–1.2)

## 2017-04-24 LAB — I-STAT CHEM 8, ED
BUN: 33 mg/dL — ABNORMAL HIGH (ref 6–20)
CALCIUM ION: 1.1 mmol/L — AB (ref 1.15–1.40)
Chloride: 105 mmol/L (ref 101–111)
Creatinine, Ser: 5 mg/dL — ABNORMAL HIGH (ref 0.61–1.24)
GLUCOSE: 149 mg/dL — AB (ref 65–99)
HCT: 34 % — ABNORMAL LOW (ref 39.0–52.0)
HEMOGLOBIN: 11.6 g/dL — AB (ref 13.0–17.0)
Potassium: 4.3 mmol/L (ref 3.5–5.1)
Sodium: 138 mmol/L (ref 135–145)
TCO2: 22 mmol/L (ref 22–32)

## 2017-04-24 LAB — CREATININE, URINE, RANDOM: Creatinine, Urine: 250.79 mg/dL

## 2017-04-24 LAB — TROPONIN I: Troponin I: <0.03 ng/mL (ref <0.03)

## 2017-04-24 LAB — LACTIC ACID, PLASMA: Lactic Acid, Venous: 2.5 mmol/L (ref 0.5–1.9)

## 2017-04-24 LAB — TSH: TSH: 0.153 u[IU]/mL — ABNORMAL LOW (ref 0.350–4.500)

## 2017-04-24 LAB — I-STAT TROPONIN, ED: TROPONIN I, POC: 0.01 ng/mL (ref 0.00–0.08)

## 2017-04-24 LAB — SODIUM, URINE, RANDOM: Sodium, Ur: 47 mmol/L

## 2017-04-24 MED ORDER — POLYVINYL ALCOHOL 1.4 % OP SOLN
1.0000 [drp] | Freq: Two times a day (BID) | OPHTHALMIC | Status: DC
  Administered 2017-04-24 – 2017-04-25 (×3): 1 [drp] via OPHTHALMIC
  Filled 2017-04-24 (×2): qty 15

## 2017-04-24 MED ORDER — ONDANSETRON HCL 4 MG PO TABS: 4.0000 mg | ORAL_TABLET | Freq: Four times a day (QID) | ORAL | Status: DC | PRN

## 2017-04-24 MED ORDER — INSULIN ASPART 100 UNIT/ML ~~LOC~~ SOLN: 0.0000 [IU] | Freq: Three times a day (TID) | SUBCUTANEOUS | Status: DC

## 2017-04-24 MED ORDER — SODIUM CHLORIDE 0.9 % IV BOLUS (SEPSIS)
1000.0000 mL | Freq: Once | INTRAVENOUS | Status: AC
  Administered 2017-04-24: 1000 mL via INTRAVENOUS

## 2017-04-24 MED ORDER — ONDANSETRON HCL 4 MG/2ML IJ SOLN
4.0000 mg | Freq: Four times a day (QID) | INTRAMUSCULAR | Status: DC | PRN
Start: 2017-04-24 — End: 2017-04-25

## 2017-04-24 MED ORDER — ACETAMINOPHEN 650 MG RE SUPP: 650.0000 mg | Freq: Four times a day (QID) | RECTAL | Status: DC | PRN

## 2017-04-24 MED ORDER — SERTRALINE HCL 50 MG PO TABS
75.0000 mg | ORAL_TABLET | Freq: Every day | ORAL | Status: DC
  Administered 2017-04-24 – 2017-04-25 (×2): 75 mg via ORAL
  Filled 2017-04-24 (×2): qty 1

## 2017-04-24 MED ORDER — LEVETIRACETAM 500 MG PO TABS
500.0000 mg | ORAL_TABLET | Freq: Two times a day (BID) | ORAL | Status: DC
  Administered 2017-04-24 – 2017-04-25 (×3): 500 mg via ORAL
  Filled 2017-04-24 (×3): qty 1

## 2017-04-24 MED ORDER — TAMSULOSIN HCL 0.4 MG PO CAPS
0.4000 mg | ORAL_CAPSULE | Freq: Every day | ORAL | Status: DC
  Administered 2017-04-24: 0.4 mg via ORAL
  Filled 2017-04-24: qty 1

## 2017-04-24 MED ORDER — ASPIRIN 81 MG PO CHEW
81.0000 mg | CHEWABLE_TABLET | Freq: Every day | ORAL | Status: DC
  Administered 2017-04-24 – 2017-04-25 (×2): 81 mg via ORAL
  Filled 2017-04-24 (×2): qty 1

## 2017-04-24 MED ORDER — SENNA 8.6 MG PO TABS
1.0000 | ORAL_TABLET | Freq: Every morning | ORAL | Status: DC
  Administered 2017-04-24: 8.6 mg via ORAL
  Filled 2017-04-24: qty 1

## 2017-04-24 MED ORDER — DONEPEZIL HCL 10 MG PO TABS
10.0000 mg | ORAL_TABLET | Freq: Every day | ORAL | Status: DC
  Administered 2017-04-24: 10 mg via ORAL
  Filled 2017-04-24 (×2): qty 1

## 2017-04-24 MED ORDER — SODIUM CHLORIDE 0.9 % IV BOLUS (SEPSIS)
500.0000 mL | Freq: Once | INTRAVENOUS | Status: AC
  Administered 2017-04-24: 500 mL via INTRAVENOUS

## 2017-04-24 MED ORDER — DEXTROSE 5 % IV SOLN
1.0000 g | Freq: Every day | INTRAVENOUS | Status: DC
  Administered 2017-04-24 – 2017-04-25 (×2): 1 g via INTRAVENOUS
  Filled 2017-04-24 (×2): qty 10

## 2017-04-24 MED ORDER — HEPARIN (PORCINE) IN NACL 100-0.45 UNIT/ML-% IJ SOLN
1100.0000 [IU]/h | INTRAMUSCULAR | Status: DC
  Administered 2017-04-24: 900 [IU]/h via INTRAVENOUS
  Filled 2017-04-24 (×3): qty 250

## 2017-04-24 MED ORDER — GABAPENTIN 100 MG PO CAPS
100.0000 mg | ORAL_CAPSULE | Freq: Every day | ORAL | Status: DC
  Administered 2017-04-24: 100 mg via ORAL
  Filled 2017-04-24: qty 1

## 2017-04-24 MED ORDER — SODIUM CHLORIDE 0.9 % IV SOLN
INTRAVENOUS | Status: AC
  Administered 2017-04-24 (×2): via INTRAVENOUS

## 2017-04-24 MED ORDER — ACETAMINOPHEN 325 MG PO TABS: 650.0000 mg | ORAL_TABLET | Freq: Four times a day (QID) | ORAL | Status: DC | PRN

## 2017-04-24 MED ORDER — LATANOPROST 0.005 % OP SOLN
1.0000 [drp] | Freq: Every day | OPHTHALMIC | Status: DC
  Administered 2017-04-24: 1 [drp] via OPHTHALMIC
  Filled 2017-04-24: qty 2.5

## 2017-04-24 NOTE — ED Notes (Signed)
Pt transported to floor after new cbg result of 84 was obtained.

## 2017-04-24 NOTE — Progress Notes (Signed)
EEG complete - results pending 

## 2017-04-24 NOTE — H&P (Addendum)
History and Physical    Grant Lara GHW:[id number] DOB: 01/23/1941 DOA: 04/24/2017  PCP: Gildardo Cranker, DO  Patient coming from: Skilled nursing facility.  Chief Complaint: Fall and possible loss of consciousness.  HPI: Grant Lara is a 77 y.o. male with 3 of CAD status post stenting, ischemic cardiomyopathy status post AICD placement, history of pulmonary embolism on Xarelto, chronic anemia, Crohn's disease, diabetes mellitus, dementia was brought to the ER after patient had a fall and syncopal episode at the nursing home.  As per the report received from the ER physician patient was walking to the bathroom and suddenly he lost consciousness and fell.  Patient on fall he was found to be hypotensive.  EMS was called and patient was brought to the ER.  Patient as per the reports did not have any nausea vomiting abdominal pain diarrhea chest pain or shortness of breath.  ED Course: In the ER patient is found to be hypotensive and was given 1 L fluid bolus.  EKG was concerning for ST-T changes in the anterior leads and cardiology was consulted and felt the changes were chronic.  CT of the head and neck was done which did not show anything acute.  UA shows features concerning for UTI.  Patient's creatinine is markedly elevated from the previous to have increased from 1.1-5.  As per the ER physician patient had some staring spells in the ER.  On my exam patient is oriented to his name and place and is oriented to the month.  Moves all extremities.  Generally weak.  Review of Systems: As per HPI, rest all negative.   Past Medical History:  Diagnosis Date  . AICD (automatic cardioverter/defibrillator) present   . Arthritis    "used to have a touch in my legs"  . Bradycardia 06/01/2014  . CAD (coronary artery disease)    LHC 9/05 with Dr. Einar Gip:  dLM 20-30%, LAD 85%, oD1 20-30%.  PCI:  Taxus DES to LAD; Dx jailed and tx with POBA.  Last myoview 12/10: inf scar, no ischemia, EF 29%.  . CHF  (congestive heart failure) ([place])   . Crohn's disease ([place])   . Dementia   . Diabetes mellitus    11/05/11 "borderline; don't take medications"  . Dilated cardiomyopathy ([place])    2/12: EF 30-35%, trivial AI, mild RAE.  EF 2014 40 -45%  . DVT (deep venous thrombosis) ([place])   . HCAP (healthcare-associated pneumonia) 09/29/2015  . HLD (hyperlipidemia)   . 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 Hyperlipidemia   . Hypertension   . Memory difficulties   . Nephrolithiasis   . PFO (patent foramen ovale)    Not mentioned on 2014 echo.  . Pneumonia ~ 2011   09-22-13 denies any recent SOB or breathing problems  . Pulmonary embolus (HCC)    chronic coumadin  . Stroke [facility]) 1993   "left arm can't hold steady; leg too"  . Swelling of both ankles     09-22-13 occ.feet, but denies pain.    Past Surgical History:  Procedure Laterality Date  . APPENDECTOMY    . COLON SURGERY  1994; 1996   "for Crohn's disease"  . IMPLANTABLE CARDIOVERTER DEFIBRILLATOR IMPLANT N/A 06/02/2014   Procedure: IMPLANTABLE CARDIOVERTER DEFIBRILLATOR IMPLANT;  Surgeon: [name], MD;  Location: [facility] CATH LAB;  Service: Cardiovascular;  Laterality: N/A;     reports that he has been smoking cigarettes.  He has a 26.50 pack-year smoking history. he has never used smokeless tobacco.  He reports that he does not drink alcohol or use drugs.  Allergies  Allergen Reactions  . Tuberculin Tests Other (See Comments)    Per MAR (no description provided)    Family History  Problem Relation Age of Onset  . Diabetes type II Mother   . CAD Father   . Diabetes type II Brother     Prior to Admission medications   Medication Sig Start Date End Date Taking? Authorizing Provider  aspirin 81 MG tablet Take 81 mg by mouth daily.   Yes [provider]  donepezil (ARICEPT) 10 MG tablet Take 10 mg by mouth at bedtime.  02/23/15  Yes [provider]  gabapentin (NEURONTIN) 100 MG capsule Take 100 mg by mouth at bedtime.   Yes  [provider]  latanoprost (XALATAN) 0.005 % ophthalmic solution Place 1 drop into both eyes at bedtime. 06/29/13  Yes [provider]  levETIRAcetam (KEPPRA) 500 MG tablet Take 1 tablet (500 mg total) by mouth 2 (two) times daily. 03/28/16  Yes Hosie Poisson, MD  Nutritional Supplements (NUTRITIONAL SUPPLEMENT PO) Take 120 mLs by mouth 3 (three) times daily with meals. House supplement - [employer]   Yes [provider]  polyvinyl alcohol (LIQUIFILM TEARS) 1.4 % ophthalmic solution Place 1 drop into both eyes 2 (two) times daily.   Yes [provider]  rivaroxaban (XARELTO) 20 MG TABS tablet Take 20 mg by mouth at bedtime.   Yes [provider]  senna (SENOKOT) 8.6 MG TABS tablet Take 1 tablet by mouth every morning.   Yes [provider]  sertraline (ZOLOFT) 100 MG tablet Take 75 mg by mouth daily.   Yes [provider]  tamsulosin (FLOMAX) 0.4 MG CAPS capsule Take 0.4 mg by mouth at bedtime.   Yes [provider]  traMADol (ULTRAM) 50 MG tablet Take 1 tablet (50 mg total) by mouth 2 (two) times daily. 03/28/17  Yes Gerlene Fee, NP  UNABLE TO FIND Take 120 mLs by mouth 2 (two) times daily. Med Name: Med Pass   Yes [provider]    Physical Exam: Vitals:   04/24/17 0445 04/24/17 0500 04/24/17 0515 04/24/17 0600  BP: (!) 110/57 (!) 106/54 (!) 118/54 (!) 101/54  Pulse:      Resp: 11 17 (!) 24 19  Temp:      TempSrc:      SpO2:          Constitutional: Moderately built and nourished. Vitals:   04/24/17 0445 04/24/17 0500 04/24/17 0515 04/24/17 0600  BP: (!) 110/57 (!) 106/54 (!) 118/54 (!) 101/54  Pulse:      Resp: 11 17 (!) 24 19  Temp:      TempSrc:      SpO2:       Eyes: Anicteric no pallor. ENMT: No discharge from the ears eyes nose or mouth. Neck: No mass felt.  No neck rigidity. Respiratory: No rhonchi or crepitations. Cardiovascular: S1-S2 heard no murmurs appreciated. Abdomen: Soft  nontender bowel sounds present. Musculoskeletal: No edema.  No joint effusion. Skin: No rash.  Skin appears warm. Neurologic: Alert awake oriented to his name and place.  Moves all extremities though generally weak. Psychiatric: Appears mildly confused.   Labs on Admission: I have personally reviewed following labs and imaging studies  CBC: Recent Labs  Lab 04/24/17 0325 04/24/17 0333  WBC 11.2*  --   NEUTROABS 9.6*  --   HGB 9.7* 11.6*  HCT 32.9* 34.0*  MCV 73.1*  --   PLT 229  --    Basic Metabolic Panel: Recent Labs  Lab 04/24/17 0325 04/24/17 0333  NA 135 138  K 4.4 4.3  CL 103 105  CO2 20*  --   GLUCOSE 149* 149*  BUN 36* 33*  CREATININE 5.00* 5.00*  CALCIUM 8.3*  --    GFR: Estimated Creatinine Clearance: 9.5 mL/min (A) (by C-G formula based on SCr of 5 mg/dL (H)). Liver Function Tests: Recent Labs  Lab 04/24/17 0325  AST 26  ALT 13*  ALKPHOS 81  BILITOT 0.5  PROT 6.6  ALBUMIN 3.0*   No results for input(s): LIPASE, AMYLASE in the last 168 hours. No results for input(s): AMMONIA in the last 168 hours. Coagulation Profile: Recent Labs  Lab 04/24/17 0325  INR 2.17   Cardiac Enzymes: Recent Labs  Lab 04/24/17 0325  TROPONINI <0.03   BNP (last 3 results) No results for input(s): PROBNP in the last 8760 hours. HbA1C: No results for input(s): HGBA1C in the last 72 hours. CBG: No results for input(s): GLUCAP in the last 168 hours. Lipid Profile: No results for input(s): CHOL, HDL, LDLCALC, TRIG, CHOLHDL, LDLDIRECT in the last 72 hours. Thyroid Function Tests: No results for input(s): TSH, T4TOTAL, FREET4, T3FREE, THYROIDAB in the last 72 hours. Anemia Panel: No results for input(s): VITAMINB12, FOLATE, FERRITIN, TIBC, IRON, RETICCTPCT in the last 72 hours. Urine analysis:    Component Value Date/Time   COLORURINE YELLOW 04/24/2017 0424   APPEARANCEUR CLOUDY (A) 04/24/2017 0424   LABSPEC >1.030 (H) 04/24/2017 0424   PHURINE 5.0 04/24/2017  0424   GLUCOSEU NEGATIVE 04/24/2017 0424   HGBUR SMALL (A) 04/24/2017 0424   BILIRUBINUR SMALL (A) 04/24/2017 0424   KETONESUR 15 (A) 04/24/2017 0424   PROTEINUR NEGATIVE 04/24/2017 0424   UROBILINOGEN 1.0 12/21/2014 1520   NITRITE NEGATIVE 04/24/2017 0424   LEUKOCYTESUR TRACE (A) 04/24/2017 0424   Sepsis Labs: @LABRCNTIP (procalcitonin:4,lacticidven:4) )No results found for this or any previous visit (from the past 240 hour(s)).   Radiological Exams on Admission: Ct Head Wo Contrast  Result Date: 04/24/2017 CLINICAL DATA:  Fall with neck pain.  Unwitnessed fall. EXAM: CT HEAD WITHOUT CONTRAST CT CERVICAL SPINE WITHOUT CONTRAST TECHNIQUE: Multidetector CT imaging of the head and cervical spine was performed following the standard protocol without intravenous contrast. Multiplanar CT image reconstructions of the cervical spine were also generated. COMPARISON:  Head CT and cervical spine CT 06/05/2016 FINDINGS: CT HEAD FINDINGS Brain: No mass lesion, intraparenchymal hemorrhage or extra-axial collection. No evidence of acute cortical infarct. There are multiple old infarcts, including bilateral basal ganglia lacunar infarcts, left occipital lobe infarct and bilateral There is periventricular hypoattenuation compatible with chronic microvascular disease. cerebellar infarcts. These are unchanged compared to the prior head CT. Vascular: No hyperdense vessel or unexpected calcification. Skull: Normal visualized skull base, calvarium and extracranial soft tissues. Sinuses/Orbits: No sinus fluid levels or advanced mucosal thickening. No mastoid effusion. Normal orbits. CT CERVICAL SPINE FINDINGS Alignment: No static subluxation. Facets are aligned. Occipital condyles are normally positioned. Skull base and vertebrae: No acute fracture. Soft tissues and spinal canal: No prevertebral fluid or swelling. No visible canal hematoma. Disc levels: There is multilevel osteophytosis and uncovertebral hypertrophy. No  bony spinal canal stenosis. Upper chest: No pneumothorax, pulmonary nodule or pleural effusion. Other: Normal visualized paraspinal cervical soft tissues. IMPRESSION: 1. 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 2. No acute fracture or static subluxation of the  cervical spine. Electronically Signed   By: Ulyses Jarred M.D.   On: 04/24/2017 03:56   Ct Cervical Spine Wo Contrast  Result Date: 04/24/2017 CLINICAL DATA:  Fall with neck pain.  Unwitnessed fall. EXAM: CT HEAD WITHOUT CONTRAST CT CERVICAL SPINE WITHOUT CONTRAST TECHNIQUE: Multidetector CT imaging of the head and cervical spine was performed following the standard protocol without intravenous contrast. Multiplanar CT image reconstructions of the cervical spine were also generated. COMPARISON:  Head CT and cervical spine CT 06/05/2016 FINDINGS: CT HEAD FINDINGS Brain: No mass lesion, intraparenchymal hemorrhage or extra-axial collection. No evidence of acute cortical infarct. There are multiple old infarcts, including bilateral basal ganglia lacunar infarcts, left occipital lobe infarct and bilateral There is periventricular hypoattenuation compatible with chronic microvascular disease. cerebellar infarcts. These are unchanged compared to the prior head CT. Vascular: No hyperdense vessel or unexpected calcification. Skull: Normal visualized skull base, calvarium and extracranial soft tissues. Sinuses/Orbits: No sinus fluid levels or advanced mucosal thickening. No mastoid effusion. Normal orbits. CT CERVICAL SPINE FINDINGS Alignment: No static subluxation. Facets are aligned. Occipital condyles are normally positioned. Skull base and vertebrae: No acute fracture. Soft tissues and spinal canal: No prevertebral fluid or swelling. No visible canal hematoma. Disc levels: There is multilevel osteophytosis and uncovertebral hypertrophy. No bony spinal canal stenosis. Upper chest: No  pneumothorax, pulmonary nodule or pleural effusion. Other: Normal visualized paraspinal cervical soft tissues. IMPRESSION: 1. 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 2. No acute fracture or static subluxation of the cervical spine. Electronically Signed   By: Ulyses Jarred M.D.   On: 04/24/2017 03:56   Dg Chest Portable 1 View  Result Date: 04/24/2017 CLINICAL DATA:  77 year old male with syncope. EXAM: PORTABLE CHEST 1 VIEW COMPARISON:  Chest radiograph dated 10/05/2015 FINDINGS: The lungs are clear. There is no pleural effusion or pneumothorax. The cardiac silhouette is within normal limits. Left pectoral AICD device. No acute osseous pathology. IMPRESSION: No active disease. Electronically Signed   By: Anner Crete M.D.   On: 04/24/2017 03:30   Dg Hips Bilat W Or Wo Pelvis 2 Views  Result Date: 04/24/2017 CLINICAL DATA:  77 year old male with fall and bilateral hip pain. EXAM: DG HIP (WITH OR WITHOUT PELVIS) 2V BILAT COMPARISON:  Radiograph dated 06/05/2016 FINDINGS: There is no acute fracture or dislocation. Mild bilateral hip arthritic changes. Degenerative changes of the lower lumbar spine. Vascular calcification noted. The soft tissues are unremarkable. IMPRESSION: No acute fracture or dislocation. Electronically Signed   By: Anner Crete M.D.   On: 04/24/2017 06:12    EKG: Independently reviewed.  Normal sinus rhythm with ST-T changes comparable to the old EKG.  Cardiology has already been notified about it.  Assessment/Plan Principal Problem:   Syncope Active Problems:   Dilated cardiomyopathy (HCC)   COPD (chronic obstructive pulmonary disease) (HCC)   Crohn's disease (HCC)   Anemia, chronic disease   Hypotension   Pulmonary embolus (HCC)   Seizures (HCC)   ARF (acute renal failure) ([place])    1. Syncope -patient was hypotensive probably causing his syncope.  During the cardiac history we will monitor in  telemetry cycle cardiac markers.  For now we will gently hydrate given that patient also has history of cardiomyopathy and closely monitor for respiratory status. 2. Acute renal failure -cause not clear.  Will check FENa and renal sonogram.  For now and gently hydrating. 3. History of seizures -patient had few staring spells in the ER as  per the ER physician.  Discussed with neurologist on-call.  Neurology advised to continue [place] which is at this time at his renal dosing.  Check EEG.  Given the patient's possible UTI and renal failure patient probably could be having metabolic encephalopathy. 4. CAD status post stenting -we will cycle cardiac markers.  Patient is on anticoagulation and aspirin. 5. Chronic anemia -hemoglobin appears to be at baseline.  Follow CBC. 6. History of PE -since patient's creatinine is markedly increased we will hold Xarelto and keep patient on heparin for now. 7. Diabetes mellitus type 2 as per the chart not on any medication at this time. -  will keep patient on sliding scale coverage. 8. Possible UTI -patient is on ceftriaxone.  Check urine cultures.  Patient has been complaining of low back pain and neck pain.  CT of the neck and x-ray of the hip has been unremarkable.  DVT prophylaxis: Heparin. Code Status: Full code. Family Communication: No family at the bedside. Disposition Plan: Skilled nursing facility. Consults called: None. Admission status: Observation.   Rise Patience MD [facility] Pager [phone].  If 7PM-7AM, please contact night-coverage [email or web address] Password [email or web address]  04/24/2017, 6:30 AM

## 2017-04-24 NOTE — ED Notes (Signed)
EDP notified of cbg. Crackers nad OJ given, rechecking cbg at 1730. Will notifiy admitting MD. Rec'ving unit RN (5W) also notified of changes.

## 2017-04-24 NOTE — Progress Notes (Signed)
CRITICAL VALUE ALERT  Critical Value:  Lactic Acid 2.5  Date & Time Notied: 04/24/17 7:47 PM   Provider Notified: [place]  Orders Received/Actions taken: No new orders at this time, will continue to monitor.

## 2017-04-24 NOTE — ED Provider Notes (Signed)
[facility] EMERGENCY DEPARTMENT Provider Note   CSN: [id number] Arrival date & time: 04/24/17  0246     History   Chief Complaint Chief Complaint  Patient presents with  . Fall  . Neck Pain    HPI Grant Lara is a 77 y.o. male.  The history is provided by the EMS personnel. The history is limited by the condition of the patient.  Fall  This is a new problem. Episode onset: unknown found at 18 at nursing home. The problem occurs constantly. The problem has not changed since onset.Associated symptoms comments: Neck pain. Nothing aggravates the symptoms. Nothing relieves the symptoms. He has tried nothing for the symptoms. The treatment provided no relief.    Past Medical History:  Diagnosis Date  . AICD (automatic cardioverter/defibrillator) present   . Arthritis    "used to have a touch in my legs"  . Bradycardia 06/01/2014  . CAD (coronary artery disease)    LHC 9/05 with Dr. Einar Gip:  dLM 20-30%, LAD 85%, oD1 20-30%.  PCI:  Taxus DES to LAD; Dx jailed and tx with POBA.  Last myoview 12/10: inf scar, no ischemia, EF 29%.  . CHF (congestive heart failure) ([place])   . Crohn's disease ([place])   . Dementia   . Diabetes mellitus    11/05/11 "borderline; don't take medications"  . Dilated cardiomyopathy ([place])    2/12: EF 30-35%, trivial AI, mild RAE.  EF 2014 40 -45%  . DVT (deep venous thrombosis) ([place])   . HCAP (healthcare-associated pneumonia) 09/29/2015  . HLD (hyperlipidemia)   . 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 Hyperlipidemia   . Hypertension   . Memory difficulties   . Nephrolithiasis   . PFO (patent foramen ovale)    Not mentioned on 2014 echo.  . Pneumonia ~ 2011   09-22-13 denies any recent SOB or breathing problems  . Pulmonary embolus (HCC)    chronic coumadin  . Stroke [facility]) 1993   "left arm can't hold steady; leg too"  . Swelling of both ankles     09-22-13 occ.feet, but denies pain.    Patient Active Problem List   Diagnosis Date Noted  .  Dyslipidemia associated with type 2 diabetes mellitus ([place]) 03/09/2017  . Hemiparesis affecting left side as late effect of cerebrovascular accident (CVA) ([place]) 02/10/2017  . Chronic low back pain 01/16/2017  . Diabetic peripheral neuropathy associated with type 2 diabetes mellitus ([place]) 01/16/2017  . Weight loss, non-intentional 12/05/2016  . Vascular dementia with behavior disturbance 09/09/2016  . Allergic rhinitis 09/04/2016  . Seizures ([place]) 03/29/2016  . DM type 2 causing CKD stage 3 ([place]) 09/30/2015  . ICD (implantable cardioverter-defibrillator) in place 09/29/2015  . Vascular dementia without behavioral disturbance 09/25/2015  . OA (osteoarthritis) 06/06/2015  . Bilateral lower extremity pain 05/14/2015  . BPH (benign prostatic hyperplasia) 04/10/2015  . Depression with anxiety 04/10/2015  . FTT (failure to thrive) in adult 11/05/2014  . Protein-calorie malnutrition, severe ([place]) 10/30/2014  . 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 Chronic combined systolic and diastolic CHF (congestive heart failure) ([place])   . Hypotension 07/16/2014  . Pulmonary embolus ([place]) 07/16/2014  . Late effects of CVA (cerebrovascular accident) 07/16/2014  . Cerebral infarction ([place])   . Hyperlipidemia   . Coronary artery disease involving native coronary artery of native heart without angina pectoris   . CKD (chronic kidney disease) stage 3, GFR 30-59 ml/min (HCC) 04/16/2014  . Anemia, chronic disease 11/21/2013  . Crohn's disease (  [place]) 02/16/2012  . DVT (deep venous thrombosis) ([place]) 06/05/2010  . COPD (chronic obstructive pulmonary disease) ([place]) 04/05/2010  . Hypertensive heart disease with CHF (congestive heart failure) ([place]) 02/10/2009  . Dilated cardiomyopathy ([place]) 12/12/2008    Past Surgical History:  Procedure Laterality Date  . APPENDECTOMY    . COLON SURGERY  1994; 1996   "for Crohn's disease"  . IMPLANTABLE CARDIOVERTER DEFIBRILLATOR IMPLANT N/A 06/02/2014   Procedure:  IMPLANTABLE CARDIOVERTER DEFIBRILLATOR IMPLANT;  Surgeon: [name], MD;  Location: [facility] CATH LAB;  Service: Cardiovascular;  Laterality: N/A;       Home Medications    Prior to Admission medications   Medication Sig Start Date End Date Taking? Authorizing Provider  aspirin 81 MG tablet Take 81 mg by mouth daily.   Yes [provider]  donepezil (ARICEPT) 10 MG tablet Take 10 mg by mouth at bedtime.  02/23/15  Yes [provider]  gabapentin (NEURONTIN) 100 MG capsule Take 100 mg by mouth at bedtime.   Yes [provider]  latanoprost (XALATAN) 0.005 % ophthalmic solution Place 1 drop into both eyes at bedtime. 06/29/13  Yes [provider]  levETIRAcetam (KEPPRA) 500 MG tablet Take 1 tablet (500 mg total) by mouth 2 (two) times daily. 03/28/16  Yes Hosie Poisson, MD  Nutritional Supplements (NUTRITIONAL SUPPLEMENT PO) Take 120 mLs by mouth 3 (three) times daily with meals. House supplement - [employer]   Yes [provider]  polyvinyl alcohol (LIQUIFILM TEARS) 1.4 % ophthalmic solution Place 1 drop into both eyes 2 (two) times daily.   Yes [provider]  rivaroxaban (XARELTO) 20 MG TABS tablet Take 20 mg by mouth at bedtime.   Yes [provider]  senna (SENOKOT) 8.6 MG TABS tablet Take 1 tablet by mouth every morning.   Yes [provider]  sertraline (ZOLOFT) 100 MG tablet Take 75 mg by mouth daily.   Yes [provider]  tamsulosin (FLOMAX) 0.4 MG CAPS capsule Take 0.4 mg by mouth at bedtime.   Yes [provider]  traMADol (ULTRAM) 50 MG tablet Take 1 tablet (50 mg total) by mouth 2 (two) times daily. 03/28/17  Yes Gerlene Fee, NP  UNABLE TO FIND Take 120 mLs by mouth 2 (two) times daily. Med Name: Med Pass   Yes [provider]    Family History Family History  Problem Relation Age of Onset  . Diabetes type II Mother   . CAD Father   . Diabetes type II Brother     Social  History Social History   Tobacco Use  . Smoking status: Current Some Day Smoker    Packs/day: 0.50    Years: 53.00    Pack years: 26.50    Types: Cigarettes  . Smokeless tobacco: Never Used  Substance Use Topics  . Alcohol use: No    Alcohol/week: 0.0 oz  . Drug use: No     Allergies   Tuberculin tests   Review of Systems Review of Systems  Unable to perform ROS: Dementia  Musculoskeletal: Positive for neck pain.     Physical Exam Updated Vital Signs BP 92/64   Pulse 76   Temp (!) 97.4 F (36.3 C) (Oral)   Resp 19   SpO2 92%   Physical Exam  Constitutional: He appears well-developed and well-nourished. No distress.  HENT:  Head: Normocephalic and atraumatic.  Right Ear: External ear normal.  Left Ear: External ear normal.  Nose: Nose normal.  Mouth/Throat: No oropharyngeal exudate.  Eyes: Conjunctivae are normal. Pupils are equal, round, and reactive to light.  Neck: Normal range of motion. Neck supple.  Cardiovascular: Normal rate, regular rhythm, normal heart sounds and intact distal pulses.  Pulmonary/Chest: Effort normal and breath sounds normal. No stridor. He has no wheezes. He has no rales.  Abdominal: Soft. Bowel sounds are normal. He exhibits no mass. There is no tenderness. There is no rebound and no guarding.  Musculoskeletal: Normal range of motion. He exhibits no deformity.       Right hip: Normal.       Left hip: Normal.  Neurological: He is alert.  Skin: Skin is warm and dry. Capillary refill takes less than 2 seconds.  Psychiatric: He has a normal mood and affect.     ED Treatments / Results  Labs (all labs ordered are listed, but only abnormal results are displayed)  Results for orders placed or performed during the hospital encounter of 04/24/17  CBC with Differential/Platelet  Result Value Ref Range   WBC 11.2 (H) 4.0 - 10.5 K/uL   RBC 4.50 4.22 - 5.81 MIL/uL   Hemoglobin 9.7 (L) 13.0 - 17.0 g/dL   HCT 32.9 (L) 39.0 - 52.0 %    MCV 73.1 (L) 78.0 - 100.0 fL   MCH 21.6 (L) 26.0 - 34.0 pg   MCHC 29.5 (L) 30.0 - 36.0 g/dL   RDW 17.5 (H) 11.5 - 15.5 %   Platelets 229 150 - 400 K/uL   Neutrophils Relative % PENDING %   Neutro Abs PENDING 1.7 - 7.7 K/uL   Band Neutrophils PENDING %   Lymphocytes Relative PENDING %   Lymphs Abs PENDING 0.7 - 4.0 K/uL   Monocytes Relative PENDING %   Monocytes Absolute PENDING 0.1 - 1.0 K/uL   Eosinophils Relative PENDING %   Eosinophils Absolute PENDING 0.0 - 0.7 K/uL   Basophils Relative PENDING %   Basophils Absolute PENDING 0.0 - 0.1 K/uL   WBC Morphology PENDING    RBC Morphology PENDING    Smear Review PENDING    nRBC PENDING 0 /100 WBC   Metamyelocytes Relative PENDING %   Myelocytes PENDING %   Promyelocytes Absolute PENDING %   Blasts PENDING %  Protime-INR  Result Value Ref Range   Prothrombin Time 24.0 (H) 11.4 - 15.2 seconds   INR 2.17   I-Stat Chem 8, ED  Result Value Ref Range   Sodium 138 135 - 145 mmol/L   Potassium 4.3 3.5 - 5.1 mmol/L   Chloride 105 101 - 111 mmol/L   BUN 33 (H) 6 - 20 mg/dL   Creatinine, Ser 5.00 (H) 0.61 - 1.24 mg/dL   Glucose, Bld 149 (H) 65 - 99 mg/dL   Calcium, Ion 1.10 (L) 1.15 - 1.40 mmol/L   TCO2 22 22 - 32 mmol/L   Hemoglobin 11.6 (L) 13.0 - 17.0 g/dL   HCT 34.0 (L) 39.0 - 52.0 %  I-stat troponin, ED  Result Value Ref Range   Troponin i, poc 0.01 0.00 - 0.08 ng/mL   Comment 3           Ct Head Wo Contrast  Result Date: 04/24/2017 CLINICAL DATA:  Fall with neck pain.  Unwitnessed fall. EXAM: CT HEAD WITHOUT CONTRAST CT CERVICAL SPINE WITHOUT CONTRAST TECHNIQUE: Multidetector CT imaging of the head and cervical spine was performed following the standard protocol without intravenous contrast. Multiplanar CT image reconstructions of the cervical spine were also generated. COMPARISON:  Head CT and cervical spine CT 06/05/2016 FINDINGS: CT HEAD FINDINGS Brain: No mass lesion, intraparenchymal hemorrhage or extra-axial collection.  No evidence of acute cortical infarct. There are multiple old infarcts, including bilateral basal ganglia lacunar infarcts, left occipital lobe infarct and bilateral There is periventricular hypoattenuation compatible with chronic microvascular disease. cerebellar infarcts. These are unchanged compared to the prior head CT. Vascular: No hyperdense vessel or unexpected calcification. Skull: Normal visualized skull base, calvarium and extracranial soft tissues. Sinuses/Orbits: No sinus fluid levels or advanced mucosal thickening. No mastoid effusion. Normal orbits. CT CERVICAL SPINE FINDINGS Alignment: No static subluxation. Facets are aligned. Occipital condyles are normally positioned. Skull base and vertebrae: No acute fracture. Soft tissues and spinal canal: No prevertebral fluid or swelling. No visible canal hematoma. Disc levels: There is multilevel osteophytosis and uncovertebral hypertrophy. No bony spinal canal stenosis. Upper chest: No pneumothorax, pulmonary nodule or pleural effusion. Other: Normal visualized paraspinal cervical soft tissues. IMPRESSION: 1. 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 2. No acute fracture or static subluxation of the cervical spine. Electronically Signed   By: Ulyses Jarred M.D.   On: 04/24/2017 03:56   Ct Cervical Spine Wo Contrast  Result Date: 04/24/2017 CLINICAL DATA:  Fall with neck pain.  Unwitnessed fall. EXAM: CT HEAD WITHOUT CONTRAST CT CERVICAL SPINE WITHOUT CONTRAST TECHNIQUE: Multidetector CT imaging of the head and cervical spine was performed following the standard protocol without intravenous contrast. Multiplanar CT image reconstructions of the cervical spine were also generated. COMPARISON:  Head CT and cervical spine CT 06/05/2016 FINDINGS: CT HEAD FINDINGS Brain: No mass lesion, intraparenchymal hemorrhage or extra-axial collection. No evidence of acute cortical infarct. There are  multiple old infarcts, including bilateral basal ganglia lacunar infarcts, left occipital lobe infarct and bilateral There is periventricular hypoattenuation compatible with chronic microvascular disease. cerebellar infarcts. These are unchanged compared to the prior head CT. Vascular: No hyperdense vessel or unexpected calcification. Skull: Normal visualized skull base, calvarium and extracranial soft tissues. Sinuses/Orbits: No sinus fluid levels or advanced mucosal thickening. No mastoid effusion. Normal orbits. CT CERVICAL SPINE FINDINGS Alignment: No static subluxation. Facets are aligned. Occipital condyles are normally positioned. Skull base and vertebrae: No acute fracture. Soft tissues and spinal canal: No prevertebral fluid or swelling. No visible canal hematoma. Disc levels: There is multilevel osteophytosis and uncovertebral hypertrophy. No bony spinal canal stenosis. Upper chest: No pneumothorax, pulmonary nodule or pleural effusion. Other: Normal visualized paraspinal cervical soft tissues. IMPRESSION: 1. 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 2. No acute fracture or static subluxation of the cervical spine. Electronically Signed   By: Ulyses Jarred M.D.   On: 04/24/2017 03:56   Dg Chest Portable 1 View  Result Date: 04/24/2017 CLINICAL DATA:  77 year old male with syncope. EXAM: PORTABLE CHEST 1 VIEW COMPARISON:  Chest radiograph dated 10/05/2015 FINDINGS: The lungs are clear. There is no pleural effusion or pneumothorax. The cardiac silhouette is within normal limits. Left pectoral AICD device. No acute osseous pathology. IMPRESSION: No active disease. Electronically Signed   By: Anner Crete M.D.   On: 04/24/2017 03:30    EKG  EKG Interpretation  Date/Time:  Thursday April 24 2017 03:03:40 EST Ventricular Rate:  65 PR Interval:    QRS Duration: 84 QT Interval:  405 QTC Calculation: 422 R Axis:   28 Text  Interpretation:  Sinus rhythm Atrial premature complexes Consider left atrial enlargement Abnormal R-wave progression, early transition LVH  by voltage Nonspecific T abnormalities, lateral leads ST elevation, consider anterior injury Confirmed by Randal Buba, Abbigal Radich ([id number]) on 04/24/2017 3:15:55 AM       Radiology No results found.  Procedures Procedures (including critical care time)  Medications Ordered in ED Medications  sodium chloride 0.9 % bolus 1,000 mL (not administered)    Code Stemi activated based on EKG and unknown syncope and pain in neck which could represent an anginal variant.  Case d/w Dr. Copper who states he has had previous EKG, to cancel.  Work up for other medical issues.  Final Clinical Impressions(s) / ED Diagnoses   Abnormal EKG fall vs syncope acute renal failure  will admit to medicine   Sowmya Partridge, MD 04/24/17 0254

## 2017-04-24 NOTE — Progress Notes (Addendum)
Interventional Cardiology Note:  Called by Dr Randal Buba to discuss possible STEMI. Mr Fritsch fell in his nursing home tonight, apparently when he got up to use the bathroom. He may have had syncope. EKG shows NSR with PAC's and anterior STE possible acute injury. However, in review of his old EKG tracings, this is unchanged and he has chronic anterior ST elevation on several tracings dating back at least 2 years. He's also had multiple ER evaluations and hospitalizations for falls, syncope, and altered mental status over the past 2 years. The patient has no chest pain, but he is hypotensive.   Labs and [place] data are all pending. After review of chart, and considering absence of chest pain and unchanged EKG, advised to continue patient's evaluation and call cardiology if a consultation is needed. However, this does not appear to be an acute STEMI.  Sherren Mocha 04/24/2017 3:30 AM

## 2017-04-24 NOTE — Progress Notes (Signed)
Grant Lara is a 77 y.o. male patient admitted from ED awake, alert - oriented  X 4 - no acute distress noted.  VSS - Blood pressure (!) 99/54, pulse (!) 120, temperature 98 F (36.7 C), temperature source Axillary, resp. rate 15, height 5' 3"  (1.6 m), weight 54.8 kg (120 lb 12.8 oz), SpO2 96 %.    IV in place, occlusive dsg intact without redness.  Orientation to room, and floor completed with information packet given to patient/family.  Patient declined safety video at this time.  Admission INP armband ID verified with patient/family, and in place.   SR up x 2, fall assessment complete, with patient and family able to verbalize understanding of risk associated with falls, and verbalized understanding to call nsg before up out of bed.  Call light within reach, patient. Skin assessment as noted.      Will cont to eval and treat per MD orders.  Richardean Chimera, RN 04/24/2017 6:11 PM

## 2017-04-24 NOTE — ED Notes (Signed)
Pt given crackers and 7.5oz soft drink for snack.

## 2017-04-24 NOTE — ED Notes (Signed)
Pt CBG 72 Rn notified. Pt offered food.

## 2017-04-24 NOTE — ED Notes (Signed)
Patient denies pain and is resting comfortably.  

## 2017-04-24 NOTE — ED Notes (Signed)
Patient transported to CT 

## 2017-04-24 NOTE — Progress Notes (Signed)
ANTICOAGULATION CONSULT NOTE - Initial Consult  Pharmacy Consult for heparin Indication: hx PE  Allergies  Allergen Reactions  . Tuberculin Tests Other (See Comments)    Per MAR (no description provided)    Patient Measurements:   Heparin Dosing Weight: 55.2kg  Vital Signs: Temp: 97.4 F (36.3 C) (01/10 0302) Temp Source: Oral (01/10 0302) BP: 100/58 (01/10 0646) Pulse Rate: 76 (01/10 0646)  Labs: Recent Labs    04/24/17 0325 04/24/17 0333  HGB 9.7* 11.6*  HCT 32.9* 34.0*  PLT 229  --   LABPROT 24.0*  --   INR 2.17  --   CREATININE 5.00* 5.00*  TROPONINI <0.03  --     Estimated Creatinine Clearance: 9.5 mL/min (A) (by C-G formula based on SCr of 5 mg/dL (H)).   Medical History: Past Medical History:  Diagnosis Date  . AICD (automatic cardioverter/defibrillator) present   . Arthritis    "used to have a touch in my legs"  . Bradycardia 06/01/2014  . CAD (coronary artery disease)    LHC 9/05 with Dr. Einar Gip:  dLM 20-30%, LAD 85%, oD1 20-30%.  PCI:  Taxus DES to LAD; Dx jailed and tx with POBA.  Last myoview 12/10: inf scar, no ischemia, EF 29%.  . CHF (congestive heart failure) ([place])   . Crohn's disease ([place])   . Dementia   . Diabetes mellitus    11/05/11 "borderline; don't take medications"  . Dilated cardiomyopathy ([place])    2/12: EF 30-35%, trivial AI, mild RAE.  EF 2014 40 -45%  . DVT (deep venous thrombosis) ([place])   . HCAP (healthcare-associated pneumonia) 09/29/2015  . HLD (hyperlipidemia)   . 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 Hyperlipidemia   . Hypertension   . Memory difficulties   . Nephrolithiasis   . PFO (patent foramen ovale)    Not mentioned on 2014 echo.  . Pneumonia ~ 2011   09-22-13 denies any recent SOB or breathing problems  . Pulmonary embolus (HCC)    chronic coumadin  . Stroke [facility]) 1993   "left arm can't hold steady; leg too"  . Swelling of both ankles     09-22-13 occ.feet, but denies pain.   Assessment: 19 yom presented to the ED  with fall and LOC. He is on chronic xarelto for history of PE. Last dose was last night at 2100. H/H slightly low and platelets are WNL. No bleeding noted.   Goal of Therapy:  Heparin level 0.3-0.7 units/ml aPTT 66-102 seconds Monitor platelets by anticoagulation protocol: Yes   Plan:  Heparin gtt 900 units/hr starting tonight at 2100 (~24 hours since last xarelto dose) Check an 8 hr aPTT Daily heparin level, aPTT and CBC  Muzammil Bruins, Rande Lawman 04/24/2017,8:07 AM

## 2017-04-24 NOTE — ED Notes (Signed)
Patient transported to X-ray 

## 2017-04-24 NOTE — ED Triage Notes (Signed)
Per EMS, pt from [facility]. Pt was found on floor at [facility]. Pt reports he was going to the bathroom and usually gets around in a wheelchair. Pt c/o neck pain for the last three days and c/o hip and head pain to EMS. No visible injury noted. Pt on a blood thinner. Pt baseline confused, hx of dementia. A&Ox3. Hx Diabetes, CBG 201. EMS VS BP 94/50, 60/70s HR paced rythym, 99% SpO2. Pt is a full code.

## 2017-04-24 NOTE — ED Notes (Signed)
Pt In and Outed. <27ms output of urine. Pt bladder scanned. 124m of urine scanned in bladder.

## 2017-04-24 NOTE — ED Notes (Signed)
MD Nettey paged with pt update.

## 2017-04-24 NOTE — Progress Notes (Signed)
Patient seen and examined at bedside, patient admitted after midnight, please see earlier detailed admission note by Gean Birchwood, MD. Briefly, patient presented with fall and possible LOC and found to have AKI with ?UTI. Mental status improved. Still awaiting lab draws from orders placed early this morning. No evidence of hydronephrosis. Continue ceftriaxone. Urine culture shown as sent but unsure if this is accurate. Patient has already received antibiotics.   Cordelia Poche, MD [facility] 04/24/2017, 1:31 PM Pager: [phone]

## 2017-04-24 NOTE — Procedures (Signed)
ELECTROENCEPHALOGRAM REPORT  Date of Study: 04/24/2017  Patient's Name: Grant Lara MRN: [id number] Date of Birth: 10-Mar-1941  Referring Provider: Dr. Gean Birchwood  Clinical History: This is a 77 year old man with staring spells.  Medications: Keppra Neurontin  Technical Summary: A multichannel digital EEG recording measured by the international 10-20 system with electrodes applied with paste and impedances below 5000 ohms performed as portable with EKG monitoring in a predominantly drowsy and asleep patient.  Hyperventilation and photic stimulation were not performed.  The digital EEG was referentially recorded, reformatted, and digitally filtered in a variety of bipolar and referential montages for optimal display.   Description: The patient is predominantly drowsy and asleep during the recording.  During brief period of wakefulness, there is a symmetric, medium voltage 6 Hz posterior dominant rhythm that attenuates with eye opening. The record is symmetric. During drowsiness and sleep, there is an increase in theta and delta slowing of the background with poorly formed vertex waves seen.  Hyperventilation and photic stimulation were not performed.  There were no epileptiform discharges or electrographic seizures seen.    EKG lead showed irregular rhythm.  Impression: This predominantly drowsy and asleep EEG is abnormal due to slowing of the posterior dominant rhythm.   Clinical Correlation of the above findings indicates diffuse cerebral dysfunction that is non-specific in etiology and can be seen with hypoxic/ischemic injury, toxic/metabolic encephalopathies, neurodegenerative disorders, medication effect, or due to excessive drowsiness.  The absence of epileptiform discharges does not rule out a clinical diagnosis of epilepsy.  Clinical correlation is advised.   Ellouise Newer, M.D.

## 2017-04-25 DIAGNOSIS — E43 Unspecified severe protein-calorie malnutrition: Secondary | ICD-10-CM | POA: Diagnosis not present

## 2017-04-25 DIAGNOSIS — J449 Chronic obstructive pulmonary disease, unspecified: Secondary | ICD-10-CM | POA: Diagnosis not present

## 2017-04-25 DIAGNOSIS — I5042 Chronic combined systolic (congestive) and diastolic (congestive) heart failure: Secondary | ICD-10-CM | POA: Diagnosis not present

## 2017-04-25 DIAGNOSIS — N170 Acute kidney failure with tubular necrosis: Secondary | ICD-10-CM | POA: Diagnosis not present

## 2017-04-25 DIAGNOSIS — R569 Unspecified convulsions: Secondary | ICD-10-CM

## 2017-04-25 DIAGNOSIS — K50919 Crohn's disease, unspecified, with unspecified complications: Secondary | ICD-10-CM

## 2017-04-25 DIAGNOSIS — I959 Hypotension, unspecified: Secondary | ICD-10-CM | POA: Diagnosis not present

## 2017-04-25 DIAGNOSIS — N179 Acute kidney failure, unspecified: Secondary | ICD-10-CM | POA: Diagnosis not present

## 2017-04-25 DIAGNOSIS — I420 Dilated cardiomyopathy: Secondary | ICD-10-CM

## 2017-04-25 DIAGNOSIS — R55 Syncope and collapse: Secondary | ICD-10-CM | POA: Diagnosis not present

## 2017-04-25 LAB — BASIC METABOLIC PANEL
ANION GAP: 6 (ref 5–15)
Anion gap: 10 (ref 5–15)
BUN: 22 mg/dL — ABNORMAL HIGH (ref 6–20)
BUN: 25 mg/dL — ABNORMAL HIGH (ref 6–20)
CALCIUM: 7.8 mg/dL — AB (ref 8.9–10.3)
CALCIUM: 8.5 mg/dL — AB (ref 8.9–10.3)
CO2: 19 mmol/L — AB (ref 22–32)
CO2: 21 mmol/L — ABNORMAL LOW (ref 22–32)
CREATININE: 1.54 mg/dL — AB (ref 0.61–1.24)
Chloride: 108 mmol/L (ref 101–111)
Chloride: 110 mmol/L (ref 101–111)
Creatinine, Ser: 1.88 mg/dL — ABNORMAL HIGH (ref 0.61–1.24)
GFR calc Af Amer: 38 mL/min — ABNORMAL LOW (ref >60)
GFR calc non Af Amer: 33 mL/min — ABNORMAL LOW (ref >60)
GFR, EST AFRICAN AMERICAN: 48 mL/min — AB (ref >60)
GFR, EST NON AFRICAN AMERICAN: 41 mL/min — AB (ref >60)
GLUCOSE: 108 mg/dL — AB (ref 65–99)
Glucose, Bld: 105 mg/dL — ABNORMAL HIGH (ref 65–99)
POTASSIUM: 4.6 mmol/L (ref 3.5–5.1)
Potassium: 4.9 mmol/L (ref 3.5–5.1)
SODIUM: 135 mmol/L (ref 135–145)
SODIUM: 139 mmol/L (ref 135–145)

## 2017-04-25 LAB — TROPONIN I

## 2017-04-25 LAB — CBC
HCT: 33.5 % — ABNORMAL LOW (ref 39.0–52.0)
Hemoglobin: 9.3 g/dL — ABNORMAL LOW (ref 13.0–17.0)
MCH: 20.2 pg — ABNORMAL LOW (ref 26.0–34.0)
MCHC: 27.8 g/dL — ABNORMAL LOW (ref 30.0–36.0)
MCV: 72.8 fL — AB (ref 78.0–100.0)
Platelets: 220 10*3/uL (ref 150–400)
RBC: 4.6 MIL/uL (ref 4.22–5.81)
RDW: 17.2 % — AB (ref 11.5–15.5)
WBC: 10.4 10*3/uL (ref 4.0–10.5)

## 2017-04-25 LAB — MRSA PCR SCREENING: MRSA by PCR: NEGATIVE

## 2017-04-25 LAB — HEPARIN LEVEL (UNFRACTIONATED): HEPARIN UNFRACTIONATED: 0.43 [IU]/mL (ref 0.30–0.70)

## 2017-04-25 LAB — APTT: aPTT: 32 seconds (ref 24–36)

## 2017-04-25 LAB — GLUCOSE, CAPILLARY
GLUCOSE-CAPILLARY: 86 mg/dL (ref 65–99)
GLUCOSE-CAPILLARY: 113 mg/dL — AB (ref 65–99)

## 2017-04-25 MED ORDER — CEFPODOXIME PROXETIL 200 MG PO TABS: 200.0000 mg | ORAL_TABLET | Freq: Two times a day (BID) | ORAL | 0 refills | Status: AC

## 2017-04-25 NOTE — Discharge Instructions (Signed)

## 2017-04-25 NOTE — Discharge Summary (Addendum)
Physician Discharge Summary  Grant Lara FAO:[id number] DOB: 1941/02/13 DOA: 04/24/2017  PCP: Gildardo Cranker, DO  Admit date: 04/24/2017 Discharge date: 04/25/2017  Admitted From: SNF Disposition: SNF  Recommendations for Outpatient Follow-up:  1. Follow up with PCP in 1 week 2. Please obtain BMP/CBC/TSH/free t4 in one week 3. Please follow up on the following pending results: Urine culture  Home Health: SNF Equipment/Devices: SNF  Discharge Condition: Stable CODE STATUS: Full code Diet recommendation: Heart healthy   Brief/Interim Summary:  Admission HPI written by Rise Patience, MD   Chief Complaint: Fall and possible loss of consciousness.  HPI: Grant Lara is a 77 y.o. male with 3 of CAD status post stenting, ischemic cardiomyopathy status post AICD placement, history of pulmonary embolism on Xarelto, chronic anemia, Crohn's disease, diabetes mellitus, dementia was brought to the ER after patient had a fall and syncopal episode at the nursing home.  As per the report received from the ER physician patient was walking to the bathroom and suddenly he lost consciousness and fell.  Patient on fall he was found to be hypotensive.  EMS was called and patient was brought to the ER.  Patient as per the reports did not have any nausea vomiting abdominal pain diarrhea chest pain or shortness of breath.  ED Course: In the ER patient is found to be hypotensive and was given 1 L fluid bolus.  EKG was concerning for ST-T changes in the anterior leads and cardiology was consulted and felt the changes were chronic.  CT of the head and neck was done which did not show anything acute.  UA shows features concerning for UTI.  Patient's creatinine is markedly elevated from the previous to have increased from 1.1-5.  As per the ER physician patient had some staring spells in the ER.  On my exam patient is oriented to his name and place and is oriented to the month.  Moves all extremities.   Generally weak.    Hospital course:  1. Syncope -patient was hypotensive probably causing his syncope. Improved with hydration. Urinalysis suggestive of UTI. Management below. 2. Acute renal failure - likely secondary to hypotension. improved with IV fluids. 3. History of seizures - patient had few staring spells in the ER as per the ER physician. Discussed with neurologist on-call.  Neurology advised to continue [place] which is at this time at his renal dosing.  EEG non-specific with no evidence of seizure. 4. CAD status post stenting - troponin cycled and negative. Continued anticoagulation and aspirin. 5. Chronic anemia -hemoglobin appears to be at baseline. 6. History of PE - Heparin secondary to renal failure. Restarted Xarelto on discharge. 7. Diabetes mellitus type 2 as per the chart not on any medication at this time. Sliding scale insulin while inpatient. 8. Possible UTI - Urinalysis suggests diagnosis. Patient started on ceftriaxone empirically. Urine culture pending. Transitioned to [place] on discharge. Recommend total 10 day course. 8 days remaining starting 1/12 with an end date of 05/04/17. 9. Low TSH: recommend repeat TSH with free T4. No symptoms of hyperthyroidism.    Discharge Diagnoses:  Principal Problem:   Syncope Active Problems:   Dilated cardiomyopathy (HCC)   COPD (chronic obstructive pulmonary disease) (HCC)   Crohn's disease (HCC)   Anemia, chronic disease   Hypotension   Pulmonary embolus (HCC)   Chronic combined systolic and diastolic CHF (congestive heart failure) (HCC)   Protein-calorie malnutrition, severe (HCC)   Seizures ([place])   ARF (acute renal failure) ([place])  Discharge Instructions  Discharge Instructions    Diet - low sodium heart healthy   Complete by:  As directed    Increase activity slowly   Complete by:  As directed      Allergies as of 04/25/2017      Reactions   Tuberculin Tests Other (See Comments)   Per MAR (no description  provided)      Medication List    TAKE these medications   aspirin 81 MG tablet Take 81 mg by mouth daily.   cefpodoxime 200 MG tablet Commonly known as:  VANTIN Take 1 tablet (200 mg total) by mouth 2 (two) times daily for 8 days. Start taking on:  04/26/2017   donepezil 10 MG tablet Commonly known as:  ARICEPT Take 10 mg by mouth at bedtime.   gabapentin 100 MG capsule Commonly known as:  NEURONTIN Take 100 mg by mouth at bedtime.   latanoprost 0.005 % ophthalmic solution Commonly known as:  XALATAN Place 1 drop into both eyes at bedtime.   levETIRAcetam 500 MG tablet Commonly known as:  KEPPRA Take 1 tablet (500 mg total) by mouth 2 (two) times daily.   NUTRITIONAL SUPPLEMENT PO Take 120 mLs by mouth 3 (three) times daily with meals. House supplement - House Shake   polyvinyl alcohol 1.4 % ophthalmic solution Commonly known as:  LIQUIFILM TEARS Place 1 drop into both eyes 2 (two) times daily.   rivaroxaban 20 MG Tabs tablet Commonly known as:  XARELTO Take 20 mg by mouth at bedtime.   senna 8.6 MG Tabs tablet Commonly known as:  SENOKOT Take 1 tablet by mouth every morning.   sertraline 100 MG tablet Commonly known as:  ZOLOFT Take 75 mg by mouth daily.   tamsulosin 0.4 MG Caps capsule Commonly known as:  FLOMAX Take 0.4 mg by mouth at bedtime.   traMADol 50 MG tablet Commonly known as:  ULTRAM Take 1 tablet (50 mg total) by mouth 2 (two) times daily.   UNABLE TO FIND Take 120 mLs by mouth 2 (two) times daily. Med Name: [place], [place], DO. Schedule an appointment as soon as possible for a visit in 1 week(s).   Specialty:  Internal Medicine Contact information: [place] [address] [phone]          Allergies  Allergen Reactions  . Tuberculin Tests Other (See Comments)    Per MAR (no description provided)    Consultations:  None   Procedures/Studies: Ct Head Wo  Contrast  Result Date: 04/24/2017 CLINICAL DATA:  Fall with neck pain.  Unwitnessed fall. EXAM: CT HEAD WITHOUT CONTRAST CT CERVICAL SPINE WITHOUT CONTRAST TECHNIQUE: Multidetector CT imaging of the head and cervical spine was performed following the standard protocol without intravenous contrast. Multiplanar CT image reconstructions of the cervical spine were also generated. COMPARISON:  Head CT and cervical spine CT 06/05/2016 FINDINGS: CT HEAD FINDINGS Brain: No mass lesion, intraparenchymal hemorrhage or extra-axial collection. No evidence of acute cortical infarct. There are multiple old infarcts, including bilateral basal ganglia lacunar infarcts, left occipital lobe infarct and bilateral There is periventricular hypoattenuation compatible with chronic microvascular disease. cerebellar infarcts. These are unchanged compared to the prior head CT. Vascular: No hyperdense vessel or unexpected calcification. Skull: Normal visualized skull base, calvarium and extracranial soft tissues. Sinuses/Orbits: No sinus fluid levels or advanced mucosal thickening. No mastoid effusion. Normal orbits. CT CERVICAL SPINE FINDINGS Alignment: No static  subluxation. Facets are aligned. Occipital condyles are normally positioned. Skull base and vertebrae: No acute fracture. Soft tissues and spinal canal: No prevertebral fluid or swelling. No visible canal hematoma. Disc levels: There is multilevel osteophytosis and uncovertebral hypertrophy. No bony spinal canal stenosis. Upper chest: No pneumothorax, pulmonary nodule or pleural effusion. Other: Normal visualized paraspinal cervical soft tissues. IMPRESSION: 1. 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 2. No acute fracture or static subluxation of the cervical spine. Electronically Signed   By: Ulyses Jarred M.D.   On: 04/24/2017 03:56   Ct Cervical Spine Wo Contrast  Result Date: 04/24/2017 CLINICAL DATA:   Fall with neck pain.  Unwitnessed fall. EXAM: CT HEAD WITHOUT CONTRAST CT CERVICAL SPINE WITHOUT CONTRAST TECHNIQUE: Multidetector CT imaging of the head and cervical spine was performed following the standard protocol without intravenous contrast. Multiplanar CT image reconstructions of the cervical spine were also generated. COMPARISON:  Head CT and cervical spine CT 06/05/2016 FINDINGS: CT HEAD FINDINGS Brain: No mass lesion, intraparenchymal hemorrhage or extra-axial collection. No evidence of acute cortical infarct. There are multiple old infarcts, including bilateral basal ganglia lacunar infarcts, left occipital lobe infarct and bilateral There is periventricular hypoattenuation compatible with chronic microvascular disease. cerebellar infarcts. These are unchanged compared to the prior head CT. Vascular: No hyperdense vessel or unexpected calcification. Skull: Normal visualized skull base, calvarium and extracranial soft tissues. Sinuses/Orbits: No sinus fluid levels or advanced mucosal thickening. No mastoid effusion. Normal orbits. CT CERVICAL SPINE FINDINGS Alignment: No static subluxation. Facets are aligned. Occipital condyles are normally positioned. Skull base and vertebrae: No acute fracture. Soft tissues and spinal canal: No prevertebral fluid or swelling. No visible canal hematoma. Disc levels: There is multilevel osteophytosis and uncovertebral hypertrophy. No bony spinal canal stenosis. Upper chest: No pneumothorax, pulmonary nodule or pleural effusion. Other: Normal visualized paraspinal cervical soft tissues. IMPRESSION: 1. 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 2. No acute fracture or static subluxation of the cervical spine. Electronically Signed   By: Ulyses Jarred M.D.   On: 04/24/2017 03:56   US Renal  Result Date: 04/24/2017 CLINICAL DATA:  Acute renal failure. EXAM: RENAL / URINARY TRACT ULTRASOUND COMPLETE  COMPARISON:  Renal ultrasound dated May 02, 2014. FINDINGS: Right Kidney: Length: 12.5 cm. Increased echogenicity. No mass or hydronephrosis visualized. Multiple simple appearing cysts are noted, the largest measuring up to 2.5 cm. Shadowing, 8 mm echogenic focus in the midpole likely represents a small calculus. Left Kidney: Length: 10.3 cm. Increased echogenicity. No mass or hydronephrosis visualized. Multiple simple appearing cysts are noted, the largest measuring up to 3.9 cm. Bladder: Appears normal for degree of bladder distention. IMPRESSION: 1. Increased cortical echogenicity of both kidneys, consistent with medical renal disease. No hydronephrosis. 2. 8 mm calculus in the midpole of the right kidney. Electronically Signed   By: Titus Dubin M.D.   On: 04/24/2017 08:11   Dg Chest Portable 1 View  Result Date: 04/24/2017 CLINICAL DATA:  77 year old male with syncope. EXAM: PORTABLE CHEST 1 VIEW COMPARISON:  Chest radiograph dated 10/05/2015 FINDINGS: The lungs are clear. There is no pleural effusion or pneumothorax. The cardiac silhouette is within normal limits. Left pectoral AICD device. No acute osseous pathology. IMPRESSION: No active disease. Electronically Signed   By: Anner Crete M.D.   On: 04/24/2017 03:30   Dg Hips Bilat W Or Wo Pelvis 2 Views  Result Date: 04/24/2017 CLINICAL DATA:  77 year old  male with fall and bilateral hip pain. EXAM: DG HIP (WITH OR WITHOUT PELVIS) 2V BILAT COMPARISON:  Radiograph dated 06/05/2016 FINDINGS: There is no acute fracture or dislocation. Mild bilateral hip arthritic changes. Degenerative changes of the lower lumbar spine. Vascular calcification noted. The soft tissues are unremarkable. IMPRESSION: No acute fracture or dislocation. Electronically Signed   By: Anner Crete M.D.   On: 04/24/2017 06:12      EEG (04/24/2017) Impression: This predominantly drowsy and asleep EEG is abnormal due to slowing of the posterior dominant rhythm.    Clinical Correlation of the above findings indicates diffuse cerebral dysfunction that is non-specific in etiology and can be seen with hypoxic/ischemic injury, toxic/metabolic encephalopathies, neurodegenerative disorders, medication effect, or due to excessive drowsiness.  The absence of epileptiform discharges does not rule out a clinical diagnosis of epilepsy.  Clinical correlation is advised.    Subjective: No concerns today.  Discharge Exam: Vitals:   04/25/17 0447 04/25/17 0447  BP: 116/62 116/62  Pulse: 60 60  Resp: 17 17  Temp: 98.5 F (36.9 C) 98.5 F (36.9 C)  SpO2: 96%    Vitals:   04/24/17 1802 04/24/17 2121 04/25/17 0447 04/25/17 0447  BP: (!) 99/54 114/62 116/62 116/62  Pulse: (!) 120 (!) 54 60 60  Resp: 15 18 17 17   Temp: 98 F (36.7 C) 97.7 F (36.5 C) 98.5 F (36.9 C) 98.5 F (36.9 C)  TempSrc: Axillary Oral Oral Oral  SpO2: 96% 98% 96%   Weight: 54.8 kg (120 lb 12.8 oz)  54.8 kg (120 lb 13 oz)   Height: 5' 9"  (1.753 m)       General: Pt is alert, awake, not in acute distress Cardiovascular: irregular rhythm, regular rate, S1/S2 + Respiratory: CTA bilaterally, no wheezing, no rhonchi Abdominal: Soft, NT, ND, bowel sounds + Extremities: no edema, no cyanosis Neuro: resting left thumb tremor    The results of significant diagnostics from this hospitalization (including imaging, microbiology, ancillary and laboratory) are listed below for reference.     Microbiology: Recent Results (from the past 240 hour(s))  MRSA PCR Screening     Status: None   Collection Time: 04/24/17  6:06 PM  Result Value Ref Range Status   MRSA by PCR NEGATIVE NEGATIVE Final    Comment:        The GeneXpert MRSA Assay (FDA approved for NASAL specimens only), is one component of a comprehensive MRSA colonization surveillance program. It is not intended to diagnose MRSA infection nor to guide or monitor treatment for MRSA infections.      Labs: BNP (last 3  results) No results for input(s): BNP in the last 8760 hours. Basic Metabolic Panel: Recent Labs  Lab 04/24/17 0325 04/24/17 0333 04/24/17 1818 04/25/17 0011 04/25/17 0815  NA 135 138 136 135 139  K 4.4 4.3 4.5 4.6 4.9  CL 103 105 108 108 110  CO2 20*  --  20* 21* 19*  GLUCOSE 149* 149* 184* 108* 105*  BUN 36* 33* 30* 25* 22*  CREATININE 5.00* 5.00* 2.47* 1.88* 1.54*  CALCIUM 8.3*  --  7.9* 7.8* 8.5*  MG  --   --  1.7  --   --    Liver Function Tests: Recent Labs  Lab 04/24/17 0325 04/24/17 1818  AST 26 40  ALT 13* 17  ALKPHOS 81 69  BILITOT 0.5 0.6  PROT 6.6 5.8*  ALBUMIN 3.0* 2.6*   No results for input(s): LIPASE, AMYLASE in the last 168  hours. No results for input(s): AMMONIA in the last 168 hours. CBC: Recent Labs  Lab 04/24/17 0325 04/24/17 0333 04/24/17 1818 04/25/17 0815  WBC 11.2*  --  10.4 10.4  NEUTROABS 9.6*  --  7.5  --   HGB 9.7* 11.6* 8.4* 9.3*  HCT 32.9* 34.0* 29.6* 33.5*  MCV 73.1*  --  72.4* 72.8*  PLT 229  --  213 220   Cardiac Enzymes: Recent Labs  Lab 04/24/17 0325 04/24/17 1818 04/25/17 0011  TROPONINI <0.03 <0.03 <0.03   BNP: Invalid input(s): POCBNP CBG: Recent Labs  Lab 04/24/17 1647 04/24/17 1727 04/24/17 2111 04/25/17 0815 04/25/17 1154  GLUCAP 59* 84 122* 86 113*   D-Dimer No results for input(s): DDIMER in the last 72 hours. Hgb A1c No results for input(s): HGBA1C in the last 72 hours. Lipid Profile No results for input(s): CHOL, HDL, LDLCALC, TRIG, CHOLHDL, LDLDIRECT in the last 72 hours. Thyroid function studies Recent Labs    04/24/17 1818  TSH 0.153*   Anemia work up No results for input(s): VITAMINB12, FOLATE, FERRITIN, TIBC, IRON, RETICCTPCT in the last 72 hours. Urinalysis    Component Value Date/Time   COLORURINE YELLOW 04/24/2017 0424   APPEARANCEUR CLOUDY (A) 04/24/2017 0424   LABSPEC >1.030 (H) 04/24/2017 0424   PHURINE 5.0 04/24/2017 0424   GLUCOSEU NEGATIVE 04/24/2017 0424   HGBUR SMALL  (A) 04/24/2017 0424   BILIRUBINUR SMALL (A) 04/24/2017 0424   KETONESUR 15 (A) 04/24/2017 0424   PROTEINUR NEGATIVE 04/24/2017 0424   UROBILINOGEN 1.0 12/21/2014 1520   NITRITE NEGATIVE 04/24/2017 0424   LEUKOCYTESUR TRACE (A) 04/24/2017 0424   Sepsis Labs Invalid input(s): PROCALCITONIN,  WBC,  LACTICIDVEN Microbiology Recent Results (from the past 240 hour(s))  MRSA PCR Screening     Status: None   Collection Time: 04/24/17  6:06 PM  Result Value Ref Range Status   MRSA by PCR NEGATIVE NEGATIVE Final    Comment:        The GeneXpert MRSA Assay (FDA approved for NASAL specimens only), is one component of a comprehensive MRSA colonization surveillance program. It is not intended to diagnose MRSA infection nor to guide or monitor treatment for MRSA infections.      Time coordinating discharge: Over 30 minutes  SIGNED:   Cordelia Poche, MD [facility] 04/25/2017, 1:08 PM Pager [phone]  If 7PM-7AM, please contact night-coverage [email or web address] Password [email or web address]

## 2017-04-25 NOTE — Progress Notes (Signed)
ANTICOAGULATION CONSULT NOTE - Follow Up Consult  Pharmacy Consult for Heparin Indication: hx PE and DVT (Xarelto on hold)  Allergies  Allergen Reactions  . Tuberculin Tests Other (See Comments)    Per MAR (no description provided)    Patient Measurements: Height: 5' 9"  (175.3 cm) Weight: 120 lb 13 oz (54.8 kg) IBW/kg (Calculated) : 70.7 Heparin Dosing Weight: 55 kg  Vital Signs: Temp: 98.5 F (36.9 C) (01/11 0447) Temp Source: Oral (01/11 0447) BP: 116/62 (01/11 0447) Pulse Rate: 60 (01/11 0447)  Labs: Recent Labs    04/24/17 0325 04/24/17 0333 04/24/17 1818 04/25/17 0011 04/25/17 0815  HGB 9.7* 11.6* 8.4*  --  9.3*  HCT 32.9* 34.0* 29.6*  --  33.5*  PLT 229  --  213  --  220  APTT  --   --   --   --  32  LABPROT 24.0*  --   --   --   --   INR 2.17  --   --   --   --   HEPARINUNFRC  --   --   --   --  0.43  CREATININE 5.00* 5.00* 2.47* 1.88* 1.54*  TROPONINI <0.03  --  <0.03 <0.03  --     Estimated Creatinine Clearance: 30.6 mL/min (A) (by C-G formula based on SCr of 1.54 mg/dL (H)).  Assessment:  33 yom presented to the ED on 1/10 with fall and LOC. He is on chronic Xarelto for history of PE (2016) and DVT (2012).  Last Xarelto dose 1/9 at 9pm.      Xarelto held and transitioned to IV heparin last night.     Initial heparin level is therapeutic (0.43) and aPTT is at baseline (32 seconds) on 900 units/hr.  Heparin level is usually falsely elevated due to recent Xarelto doses. Unsual for aPTT to be at baseline. No known infusion problems.  Goal of Therapy:  Heparin level 0.3-0.7 units/ml aPTT 66-102 seconds Monitor platelets by anticoagulation protocol: Yes   Plan:   Increase heparin drip to 1100 units/hr.  Heparin level and aPTT in ~8 hrs.  Daily heparin level, aPTT and CBC.  Xarelto on hold.  Arty Baumgartner, [place] Pager: [phone] 04/25/2017,10:48 AM

## 2017-04-25 NOTE — Progress Notes (Signed)
Grant Lara to be D/C'd [place] facility per MD order.  Discussed with the patient and given report to RN and NP at [facility]  and all questions fully answered.  VSS, Skin clean, dry and intact without evidence of skin break down, no evidence of skin tears noted. IV catheter discontinued intact. Site without signs and symptoms of complications. Dressing and pressure applied.  An After Visit Summary was printed and given to the patient. Patient received prescription.  D/c education completed with patient/family including follow up instructions, medication list, d/c activities limitations if indicated, with other d/c instructions as indicated by MD - patient able to verbalize understanding, all questions fully answered.   Patient instructed to return to ED, call 911, or call MD for any changes in condition.   Patient escorted via stretcher, and D/C to [place] via [facility] Bailiff 04/25/2017 1:45 PM

## 2017-04-25 NOTE — NC FL2 (Signed)
[place] LEVEL OF CARE SCREENING TOOL     IDENTIFICATION  Patient Name: Grant Lara Birthdate: 1941-01-18 Sex: male Admission Date (Current Location): 04/24/2017  [facility] and [place] Number:  Herbalist and Address:  [facility], [place] [address] [place], [place] [address]      Provider Number: [id number]  Attending Physician Name and Address:  Mariel Aloe, MD  Relative Name and Phone Number:  [name], spouse    Current Level of Care: Hospital Recommended Level of Care: [place] Prior Approval Number:    Date Approved/Denied:   PASRR Number:    Discharge Plan: SNF    Current Diagnoses: Patient Active Problem List   Diagnosis Date Noted  . Syncope 04/24/2017  . ARF (acute renal failure) ([place]) 04/24/2017  . Dyslipidemia associated with type 2 diabetes mellitus ([place]) 03/09/2017  . Hemiparesis affecting left side as late effect of cerebrovascular accident (CVA) ([place]) 02/10/2017  . Chronic low back pain 01/16/2017  . Diabetic peripheral neuropathy associated with type 2 diabetes mellitus ([place]) 01/16/2017  . Weight loss, non-intentional 12/05/2016  . Vascular dementia with behavior disturbance 09/09/2016  . Allergic rhinitis 09/04/2016  . Seizures ([place]) 03/29/2016  . DM type 2 causing CKD stage 3 ([place]) 09/30/2015  . ICD (implantable cardioverter-defibrillator) in place 09/29/2015  . Vascular dementia without behavioral disturbance 09/25/2015  . OA (osteoarthritis) 06/06/2015  . Bilateral lower extremity pain 05/14/2015  . BPH (benign prostatic hyperplasia) 04/10/2015  . Depression with anxiety 04/10/2015  . FTT (failure to thrive) in adult 11/05/2014  . Protein-calorie malnutrition, severe ([place]) 10/30/2014  . 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 Chronic combined systolic and diastolic CHF (congestive heart failure) ([place])   . Hypotension 07/16/2014  . Pulmonary embolus ([place]) 07/16/2014  . Late  effects of CVA (cerebrovascular accident) 07/16/2014  . Cerebral infarction ([place])   . Hyperlipidemia   . Coronary artery disease involving native coronary artery of native heart without angina pectoris   . CKD (chronic kidney disease) stage 3, GFR 30-59 ml/min (HCC) 04/16/2014  . Anemia, chronic disease 11/21/2013  . Crohn's disease ([place]) 02/16/2012  . DVT (deep venous thrombosis) ([place]) 06/05/2010  . COPD (chronic obstructive pulmonary disease) ([place]) 04/05/2010  . Hypertensive heart disease with CHF (congestive heart failure) ([place]) 02/10/2009  . Dilated cardiomyopathy ([place]) 12/12/2008    Orientation RESPIRATION BLADDER Height & Weight     Self, Place  O2(Nasal cannula 4L) Continent Weight: 54.8 kg (120 lb 13 oz) Height:  5' 9"  (175.3 cm)  BEHAVIORAL SYMPTOMS/MOOD NEUROLOGICAL BOWEL NUTRITION STATUS      Continent Diet(Please see DC Summary)  AMBULATORY STATUS COMMUNICATION OF NEEDS Skin   Limited Assist Verbally Normal                       Personal Care Assistance Level of Assistance  Bathing, Feeding, Dressing Bathing Assistance: Limited assistance Feeding assistance: Limited assistance Dressing Assistance: Limited assistance     Functional Limitations Info             SPECIAL CARE FACTORS FREQUENCY                       Contractures      Additional Factors Info  Code Status, Allergies Code Status Info: Full Allergies Info:  Tuberculin Tests           Current Medications (04/25/2017):  This is the current hospital active  medication list Current Facility-Administered Medications  Medication Dose Route Frequency Provider Last Rate Last Dose  . acetaminophen (TYLENOL) tablet 650 mg  650 mg Oral Q6H PRN Rise Patience, MD       Or  . acetaminophen (TYLENOL) suppository 650 mg  650 mg Rectal Q6H PRN Rise Patience, MD      . aspirin chewable tablet 81 mg  81 mg Oral Daily Rise Patience, MD   81 mg at 04/25/17 0940  . cefTRIAXone  (ROCEPHIN) 1 g in dextrose 5 % 50 mL IVPB  1 g Intravenous Daily Rise Patience, MD   Stopped at 04/25/17 1100  . donepezil (ARICEPT) tablet 10 mg  10 mg Oral QHS Rise Patience, MD   10 mg at 04/24/17 2326  . gabapentin (NEURONTIN) capsule 100 mg  100 mg Oral QHS Rise Patience, MD   100 mg at 04/24/17 2326  . heparin ADULT infusion 100 units/mL (25000 units/244m sodium chloride 0.45%)  1,100 Units/hr Intravenous Continuous ESkeet Simmer RPH 11 mL/hr at 04/25/17 1100 1,100 Units/hr at 04/25/17 1100  . insulin aspart (novoLOG) injection 0-9 Units  0-9 Units Subcutaneous TID WC KRise Patience MD   Stopped at 04/24/17 [id number] . latanoprost (XALATAN) 0.005 % ophthalmic solution 1 drop  1 drop Both Eyes QHS KRise Patience MD   1 drop at 04/24/17 2332  . levETIRAcetam (KEPPRA) tablet 500 mg  500 mg Oral BID KRise Patience MD   500 mg at 04/25/17 0940  . ondansetron (ZOFRAN) tablet 4 mg  4 mg Oral Q6H PRN KRise Patience MD       Or  . ondansetron ([facility] injection 4 mg  4 mg Intravenous Q6H PRN KRise Patience MD      . polyvinyl alcohol (LIQUIFILM TEARS) 1.4 % ophthalmic solution 1 drop  1 drop Both Eyes BID KRise Patience MD   1 drop at 04/25/17 0941  . senna (SENOKOT) tablet 8.6 mg  1 tablet Oral q morning - 10a KRise Patience MD   8.6 mg at 04/24/17 1052  . sertraline (ZOLOFT) tablet 75 mg  75 mg Oral Daily KRise Patience MD   75 mg at 04/25/17 0940  . tamsulosin (FLOMAX) capsule 0.4 mg  0.4 mg Oral QHS KRise Patience MD   0.4 mg at 04/24/17 2326     Discharge Medications: Please see discharge summary for a list of discharge medications.  Relevant Imaging Results:  Relevant Lab Results:   Additional Information SSN:  [id number] NBenard Halsted LCSWA

## 2017-04-25 NOTE — Clinical Social Work Note (Signed)
Clinical Social Work Assessment  Patient Details  Name: Grant Lara MRN: [id number] Date of Birth: 06-20-1940  Date of referral:  04/25/17               Reason for consult:  Discharge Planning                Permission sought to share information with:  Facility Sport and exercise psychologist, Family Supports Permission granted to share information::  No  Name::     Psychologist, educational::  [employer]  Relationship::  Son  Sport and exercise psychologist Information:     Housing/Transportation Living arrangements for the past 2 months:  [place] of Information:  Adult Children Patient Interpreter Needed:  None Criminal Activity/Legal Involvement Pertinent to Current Situation/Hospitalization:  No - Comment as needed Significant Relationships:  Adult Children Lives with:  Facility Resident Do you feel safe going back to the place where you live?  Yes Need for family participation in patient care:  Yes (Comment)  Care giving concerns:  CSW received consult regarding discharge planning. CSW spoke with patient's son. He reported that patient resides at [facility] and will return there at discharge. CSW to continue to follow and assist with discharge planning needs.   Social Worker assessment / plan:  CSW spoke with patient's son regarding discharge plan.   Employment status:  Retired Forensic scientist:  Medicare PT Recommendations:  Not assessed at this time Information / Referral to community resources:  [place]  Patient/Family's Response to care:  Patient's son  Reports understanding of discharge plan by [employer].   Patient/Family's Understanding of and Emotional Response to Diagnosis, Current Treatment, and Prognosis:  Patient/family is realistic regarding therapy needs and expressed being hopeful for return to SNF placement. Patient expressed understanding of CSW role and discharge process as well as medical condition. No questions/concerns about plan or treatment.    Emotional  Assessment Appearance:  Appears stated age Attitude/Demeanor/Rapport:  Unable to Assess Affect (typically observed):  Unable to Assess Orientation:  Oriented to Place, Oriented to Self Alcohol / Substance use:  Not Applicable Psych involvement (Current and /or in the community):  No (Comment)  Discharge Needs  Concerns to be addressed:  Care Coordination Readmission within the last 30 days:  No Current discharge risk:  None Barriers to Discharge:  No Barriers Identified   Benard Halsted, [place] 04/25/2017, 2:06 PM

## 2017-04-25 NOTE — Progress Notes (Signed)
Patient will DC to: [facility] Anticipated DC date: 04/25/17 Family notified: Son Transport by: Corey Harold   Per MD patient ready for DC to [facility]. RN, patient, patient's family, and facility notified of DC. Discharge Summary sent to facility. RN given number for report [phone] Room 107B). DC packet on chart. Ambulance transport requested for patient.   CSW signing off.  Cedric Fishman, [place] Social Worker [phone]

## 2017-04-25 NOTE — Progress Notes (Signed)
Report given to RN receiving pt at [facility].

## 2017-04-26 LAB — URINE CULTURE: Culture: NO GROWTH

## 2017-04-28 ENCOUNTER — Other Ambulatory Visit: Payer: Self-pay

## 2017-04-28 ENCOUNTER — Encounter: Payer: Self-pay | Admitting: Internal Medicine

## 2017-04-28 ENCOUNTER — Telehealth: Payer: Self-pay

## 2017-04-28 ENCOUNTER — Non-Acute Institutional Stay (SKILLED_NURSING_FACILITY): Payer: Medicare Other | Admitting: Internal Medicine

## 2017-04-28 DIAGNOSIS — I69354 Hemiplegia and hemiparesis following cerebral infarction affecting left non-dominant side: Secondary | ICD-10-CM | POA: Diagnosis not present

## 2017-04-28 DIAGNOSIS — F0151 Vascular dementia with behavioral disturbance: Secondary | ICD-10-CM | POA: Diagnosis not present

## 2017-04-28 DIAGNOSIS — I2782 Chronic pulmonary embolism: Secondary | ICD-10-CM

## 2017-04-28 DIAGNOSIS — I825Z9 Chronic embolism and thrombosis of unspecified deep veins of unspecified distal lower extremity: Secondary | ICD-10-CM

## 2017-04-28 DIAGNOSIS — M542 Cervicalgia: Secondary | ICD-10-CM | POA: Diagnosis not present

## 2017-04-28 DIAGNOSIS — I5042 Chronic combined systolic (congestive) and diastolic (congestive) heart failure: Secondary | ICD-10-CM

## 2017-04-28 DIAGNOSIS — N183 Chronic kidney disease, stage 3 (moderate): Secondary | ICD-10-CM | POA: Diagnosis not present

## 2017-04-28 DIAGNOSIS — E1122 Type 2 diabetes mellitus with diabetic chronic kidney disease: Secondary | ICD-10-CM

## 2017-04-28 DIAGNOSIS — R569 Unspecified convulsions: Secondary | ICD-10-CM | POA: Diagnosis not present

## 2017-04-28 DIAGNOSIS — F01518 Vascular dementia, unspecified severity, with other behavioral disturbance: Secondary | ICD-10-CM

## 2017-04-28 LAB — HEPATIC FUNCTION PANEL
ALT: 13 (ref 10–40)
AST: 15 (ref 14–40)
Alkaline Phosphatase: 80 (ref 25–125)
BILIRUBIN, TOTAL: 0.3

## 2017-04-28 LAB — BASIC METABOLIC PANEL
BUN: 18 (ref 4–21)
Creatinine: 1.1 (ref 0.6–1.3)
Glucose: 85
Potassium: 4.3 (ref 3.4–5.3)
SODIUM: 144 (ref 137–147)

## 2017-04-28 LAB — CBC AND DIFFERENTIAL
HCT: 28 — AB (ref 41–53)
Hemoglobin: 8.6 — AB (ref 13.5–17.5)
Neutrophils Absolute: 11
Platelets: 240 (ref 150–399)
WBC: 13.6

## 2017-04-28 MED ORDER — TRAMADOL HCL 50 MG PO TABS: 50.0000 mg | ORAL_TABLET | Freq: Three times a day (TID) | ORAL | 0 refills | Status: DC

## 2017-04-28 NOTE — Progress Notes (Signed)
Patient ID: Grant Lara, male   DOB: [birth year], 77 y.o.   MRN: [id number]   Provider:  DR Arletha Grippe Location:  [place] Room Number: [place] of Service:  SNF (31)  PCP: Gildardo Cranker, DO Patient Care Team: Gildardo Cranker, DO as PCP - [facility], [place] ([place])  Extended Emergency Contact Information Primary Emergency Contact: Wendling,Curtis          [place], [place] of [place] Phone: [phone] Relation: Son Secondary Emergency Contact: Aungst,[name] Address: [address]          [place], [place] [address] [name] of [place] Mobile Phone: [phone] Relation: Spouse  Code Status: Full Code Goals of Care: Advanced Directive information Advanced Directives 04/28/2017  Does Patient Have a Medical Advance Directive? Yes  Type of Advance Directive Out of facility DNR (pink MOST or yellow form)  Does patient want to make changes to medical advance directive? No - Patient declined  Copy of [place] in Chart? -  Would patient like information on creating a medical advance directive? No - Patient declined  Pre-existing out of facility DNR order (yellow form or pink MOST form) Pink MOST form placed in chart (order not valid for inpatient use)      Chief Complaint  Patient presents with  . Hospitalization Follow-up    [facility] - Readmission    HPI: Patient is a 77 y.o. male seen today for readmission to SNF following hospital stay for fall on 04/24/17, syncope, hypotension, AKI, low TSH, sz d/o, dementia. He presented to the ED after syncope and fall. He was tx with IVF. CT head showed no acute changes but did reveal old infarct. UA c/e UTI and he was given IV rocephin --> po vantin x 10 days (stop date 05/04/17).  Hgb 9.3; WBC 11.2K abs neutrophils 9.6K-->10.4K WBC; Cr 5-->1.54; albumin 2.6; INR 2.17; urine cx showed no growth; TSH 0.153 at d/c. He presents  to SNF for long term care.  Today he reports severe right neck pain. He fell last week. CT C-spine revealed multilevel osteophytosis/uncovertebral hypertrophy but no spinal canal stenosis, fx mass. He is currently on Tramadol BID for pain. He is a poor historian due to dementia. Hx obtained from chart.  Seizure d/o - stable on keppra 500 mg twice daily; no recent sz activity  DM - diet controlled. A1c 5.9%; not on ACEI/ARB 2/2 low BPs  Bilateral lower extremity pain - stable on tylenol  1 gm twice daily and ultram 50 mg twice daily  Dyslipidemia - stable off lipitor 2/2 weight loss; LDL 67   Dementia - stable on aricept 10 mg nightly. 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 Stable without medication. He takes ASA daily  CAD - no angina. Stable on ASA 81 mg daily  COPD - stable without medication. No recent exacerbation   Depression - mood stable on zoloft 75 mg daily; trazodone 50 mg nightly; GDR done 03-05-17  CKD  - stage 3. Stable. 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   Crohn's disease - he has constipation and stable on senna daily  Hypotension - improved. Current BP 124/68  Past Medical History:  Diagnosis Date  . AICD (automatic cardioverter/defibrillator) present   . Arthritis    "used to have a touch in my legs"  . Bradycardia 06/01/2014  . CAD (coronary artery disease)    LHC 9/05 with Dr. Einar Gip:  dLM 20-30%, LAD 85%, oD1 20-30%.  PCI:  Taxus DES to LAD; Dx jailed and tx with POBA.  Last myoview 12/10: inf scar, no ischemia, EF 29%.  . CHF (congestive heart failure) ([place])   . Crohn's disease ([place])   . Dementia   . Diabetes mellitus    11/05/11 "borderline; don't take medications"  . Dilated cardiomyopathy ([place])      2/12: EF 30-35%, trivial AI, mild RAE.  EF 2014 40 -45%  . DVT (deep venous thrombosis) ([place])   . HCAP (healthcare-associated pneumonia) 09/29/2015  . HLD (hyperlipidemia)   . 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 Hyperlipidemia   . Hypertension   . Memory difficulties   . Nephrolithiasis   . PFO (patent foramen ovale)    Not mentioned on 2014 echo.  . Pneumonia ~ 2011   09-22-13 denies any recent SOB or breathing problems  . Pulmonary embolus (HCC)    chronic coumadin  . Stroke [facility]) 1993   "left arm can't hold steady; leg too"  . Swelling of both ankles     09-22-13 occ.feet, but denies pain.   Past Surgical History:  Procedure Laterality Date  . APPENDECTOMY    . COLON SURGERY  1994; 1996   "for Crohn's disease"  . IMPLANTABLE CARDIOVERTER DEFIBRILLATOR IMPLANT N/A 06/02/2014   Procedure: IMPLANTABLE CARDIOVERTER DEFIBRILLATOR IMPLANT;  Surgeon: [name], MD;  [facility] C[facility] LAB;  Service: Cardiovascular;  Laterality: N/A;    reports that he has been smoking cigarettes.  He has a 26.50 pack-year smoking history. he has never used smokeless tobacco. He reports that he does not drink alcohol or use drugs. Social History   Socioeconomic History  . Marital status: Married    Spouse name: Not on file  . Number of children: Not on file  . Years of education: Not on file  . Highest education level: Not on file  Social Needs  . Financial resource strain: Not on file  . Food insecurity - worry: Not on file  . Food insecurity - inability: Not on file  . Transportation needs - medical: Not on file  . Transportation needs - non-medical: Not on file  Occupational History  . Not on file  Tobacco Use  . Smoking status: Current Some Day Smoker    Packs/day: 0.50    Years: 53.00    Pack years: 26.50    Types: Cigarettes  . Smokeless tobacco: Never Used  Substance and Sexual Activity  . Alcohol use: No    Alcohol/week: 0.0 oz  . Drug use: No  . Sexual activity: Not Currently   Other Topics Concern  . Not on file  Social History Narrative  . Not on file    Functional Status Survey:    Family History  Problem Relation Age of Onset  . Diabetes type II Mother   . CAD Father   . Diabetes type II Brother     Health Maintenance  Topic Date Due  . INFLUENZA VACCINE  11/21/2017 (Originally 11/13/2016)  . PNA vac Low Risk Adult (2 of 2 - PCV13) 04/16/2023 (Originally 02/20/2011)  . HEMOGLOBIN A1C  06/23/2017  . URINE MICROALBUMIN  11/12/2017  .  FOOT EXAM  12/23/2017  . OPHTHALMOLOGY EXAM  03/20/2018  . TETANUS/TDAP  06/05/2026    Allergies  Allergen Reactions  . Tuberculin Tests Other (See Comments)    Per MAR (no description provided)    Outpatient Encounter Medications as of 04/28/2017  Medication Sig  . aspirin 81 MG tablet Take 81 mg by mouth daily.  . cefpodoxime (VANTIN) 200 MG tablet Take 1 tablet (200 mg total) by mouth 2 (two) times daily for 8 days.  . cefTRIAXone (ROCEPHIN) 1 g injection Inject 1 g into the muscle once.  . donepezil (ARICEPT) 10 MG tablet Take 10 mg by mouth at bedtime.   . gabapentin (NEURONTIN) 100 MG capsule Take 100 mg by mouth at bedtime.  [name] latanoprost (XALATAN) 0.005 % ophthalmic solution Place 1 drop into both eyes at bedtime.  . levETIRAcetam (KEPPRA) 500 MG tablet Take 1 tablet (500 mg total) by mouth 2 (two) times daily.  . Nutritional Supplements (NUTRITIONAL SUPPLEMENT PO) Take 120 mLs by mouth 3 (three) times daily with meals. House supplement - House Shake  . rivaroxaban (XARELTO) 20 MG TABS tablet Take 20 mg by mouth at bedtime.  . senna (SENOKOT) 8.6 MG TABS tablet Take 1 tablet by mouth every morning.  . sertraline (ZOLOFT) 100 MG tablet Take 75 mg by mouth daily.  . tamsulosin (FLOMAX) 0.4 MG CAPS capsule Take 0.4 mg by mouth at bedtime.  . traMADol (ULTRAM) 50 MG tablet Take 1 tablet (50 mg total) by mouth 2 (two) times daily.  . [DISCONTINUED] polyvinyl alcohol (LIQUIFILM TEARS) 1.4 % ophthalmic solution  Place 1 drop into both eyes 2 (two) times daily.  . [DISCONTINUED] UNABLE TO FIND Take 120 mLs by mouth 3 (three) times daily. Med Name: Med Pass    No facility-administered encounter medications on file as of 04/28/2017.     Review of Systems  Unable to perform ROS: Dementia    Vitals:   04/28/17 1320  BP: 120/60  Pulse: 60  Resp: 16  Temp: (!) 100.4 F (38 C)  SpO2: 97%  Weight: 121 lb 12.8 oz (55.2 kg)  Height: 5' 3"  (1.6 m)   Body mass index is 21.58 kg/m. Physical Exam  Constitutional: He appears well-developed.  Frail appearing looks uncomfortable in NAD, sitting up in bed. [place] O2 intact  HENT:  Mouth/Throat: Oropharynx is clear and moist.  MMM; no oral thrush  Eyes: Pupils are equal, round, and reactive to light. No scleral icterus.  Neck: Trachea normal. Neck supple. Muscular tenderness present. No spinous process tenderness present. Carotid bruit is not present. Edema and decreased range of motion present. No thyromegaly present.    Cardiovascular: Normal rate and intact distal pulses. An irregularly irregular rhythm present. Exam reveals no gallop and no friction rub.  Murmur (1/6 SEM) heard. No distal LE edema. No calf TTP  Pulmonary/Chest: Effort normal and breath sounds normal. He has no wheezes. He has no rales. He exhibits no tenderness.  Left ACW AICD palpable  Abdominal: Soft. Normal appearance and bowel sounds are normal. He exhibits no distension, no abdominal bruit, no pulsatile midline mass and no mass. There is no hepatomegaly. There is no tenderness. There is no rigidity, no rebound and no guarding. No hernia.  Genitourinary:  Genitourinary Comments: Foley DTG clear yellow urine  Musculoskeletal: He exhibits edema (small joints).  Lymphadenopathy:    He has no cervical adenopathy.  Neurological: He is alert.  Skin: Skin is warm and dry. No rash noted.  Psychiatric:  He has a normal mood and affect. His behavior is normal. Thought content normal.     Labs reviewed: Basic Metabolic Panel: Recent Labs    04/24/17 1818 04/25/17 0011 04/25/17 0815  NA 136 135 139  K 4.5 4.6 4.9  CL 108 108 110  CO2 20* 21* 19*  GLUCOSE 184* 108* 105*  BUN 30* 25* 22*  CREATININE 2.47* 1.88* 1.54*  CALCIUM 7.9* 7.8* 8.5*  MG 1.7  --   --    Liver Function Tests: Recent Labs    12/24/16 04/24/17 0325 04/24/17 1818  AST 12* 26 40  ALT 8* 13* 17  ALKPHOS 95 81 69  BILITOT  --  0.5 0.6  PROT  --  6.6 5.8*  ALBUMIN  --  3.0* 2.6*   No results for input(s): LIPASE, AMYLASE in the last 8760 hours. No results for input(s): AMMONIA in the last 8760 hours. CBC: Recent Labs    04/24/17 0325  04/24/17 1818 04/25/17 0815 04/28/17  WBC 11.2*  --  10.4 10.4 13.6  NEUTROABS 9.6*  --  7.5  --  11  HGB 9.7*   < > 8.4* 9.3* 8.6*  HCT 32.9*   < > 29.6* 33.5* 28*  MCV 73.1*  --  72.4* 72.8*  --   PLT 229  --  213 220 240   < > = values in this interval not displayed.   Cardiac Enzymes: Recent Labs    04/24/17 0325 04/24/17 1818 04/25/17 0011  TROPONINI <0.03 <0.03 <0.03   BNP: Invalid input(s): POCBNP Lab Results  Component Value Date   HGBA1C 6.4 07/31/2016   Lab Results  Component Value Date   TSH 0.153 (L) 04/24/2017   Lab Results  Component Value Date   VITAMINB12 355 09/09/2015   Lab Results  Component Value Date   FOLATE 10.7 09/09/2015   Lab Results  Component Value Date   IRON 68 09/09/2015   TIBC 449 09/09/2015   FERRITIN 4 (L) 09/09/2015    Imaging and Procedures obtained prior to SNF admission: Ct Head Wo Contrast  Result Date: 04/24/2017 CLINICAL DATA:  Fall with neck pain.  Unwitnessed fall. EXAM: CT HEAD WITHOUT CONTRAST CT CERVICAL SPINE WITHOUT CONTRAST TECHNIQUE: Multidetector CT imaging of the head and cervical spine was performed following the standard protocol without intravenous contrast. Multiplanar CT image reconstructions of the cervical spine were also generated. COMPARISON:  Head CT and  cervical spine CT 06/05/2016 FINDINGS: CT HEAD FINDINGS Brain: No mass lesion, intraparenchymal hemorrhage or extra-axial collection. No evidence of acute cortical infarct. There are multiple old infarcts, including bilateral basal ganglia lacunar infarcts, left occipital lobe infarct and bilateral There is periventricular hypoattenuation compatible with chronic microvascular disease. cerebellar infarcts. These are unchanged compared to the prior head CT. Vascular: No hyperdense vessel or unexpected calcification. Skull: Normal visualized skull base, calvarium and extracranial soft tissues. Sinuses/Orbits: No sinus fluid levels or advanced mucosal thickening. No mastoid effusion. Normal orbits. CT CERVICAL SPINE FINDINGS Alignment: No static subluxation. Facets are aligned. Occipital condyles are normally positioned. Skull base and vertebrae: No acute fracture. Soft tissues and spinal canal: No prevertebral fluid or swelling. No visible canal hematoma. Disc levels: There is multilevel osteophytosis and uncovertebral hypertrophy. No bony spinal canal stenosis. Upper chest: No pneumothorax, pulmonary nodule or pleural effusion. Other: Normal visualized paraspinal cervical soft tissues. IMPRESSION: 1. 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 2. No acute fracture or static subluxation of the  cervical spine. Electronically Signed   By: Ulyses Jarred M.D.   On: 04/24/2017 03:56   Ct Cervical Spine Wo Contrast  Result Date: 04/24/2017 CLINICAL DATA:  Fall with neck pain.  Unwitnessed fall. EXAM: CT HEAD WITHOUT CONTRAST CT CERVICAL SPINE WITHOUT CONTRAST TECHNIQUE: Multidetector CT imaging of the head and cervical spine was performed following the standard protocol without intravenous contrast. Multiplanar CT image reconstructions of the cervical spine were also generated. COMPARISON:  Head CT and cervical spine CT 06/05/2016 FINDINGS: CT HEAD  FINDINGS Brain: No mass lesion, intraparenchymal hemorrhage or extra-axial collection. No evidence of acute cortical infarct. There are multiple old infarcts, including bilateral basal ganglia lacunar infarcts, left occipital lobe infarct and bilateral There is periventricular hypoattenuation compatible with chronic microvascular disease. cerebellar infarcts. These are unchanged compared to the prior head CT. Vascular: No hyperdense vessel or unexpected calcification. Skull: Normal visualized skull base, calvarium and extracranial soft tissues. Sinuses/Orbits: No sinus fluid levels or advanced mucosal thickening. No mastoid effusion. Normal orbits. CT CERVICAL SPINE FINDINGS Alignment: No static subluxation. Facets are aligned. Occipital condyles are normally positioned. Skull base and vertebrae: No acute fracture. Soft tissues and spinal canal: No prevertebral fluid or swelling. No visible canal hematoma. Disc levels: There is multilevel osteophytosis and uncovertebral hypertrophy. No bony spinal canal stenosis. Upper chest: No pneumothorax, pulmonary nodule or pleural effusion. Other: Normal visualized paraspinal cervical soft tissues. IMPRESSION: 1. 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 2. No acute fracture or static subluxation of the cervical spine. Electronically Signed   By: Ulyses Jarred M.D.   On: 04/24/2017 03:56   US Renal  Result Date: 04/24/2017 CLINICAL DATA:  Acute renal failure. EXAM: RENAL / URINARY TRACT ULTRASOUND COMPLETE COMPARISON:  Renal ultrasound dated May 02, 2014. FINDINGS: Right Kidney: Length: 12.5 cm. Increased echogenicity. No mass or hydronephrosis visualized. Multiple simple appearing cysts are noted, the largest measuring up to 2.5 cm. Shadowing, 8 mm echogenic focus in the midpole likely represents a small calculus. Left Kidney: Length: 10.3 cm. Increased echogenicity. No mass or hydronephrosis  visualized. Multiple simple appearing cysts are noted, the largest measuring up to 3.9 cm. Bladder: Appears normal for degree of bladder distention. IMPRESSION: 1. Increased cortical echogenicity of both kidneys, consistent with medical renal disease. No hydronephrosis. 2. 8 mm calculus in the midpole of the right kidney. Electronically Signed   By: Titus Dubin M.D.   On: 04/24/2017 08:11   Dg Chest Portable 1 View  Result Date: 04/24/2017 CLINICAL DATA:  77 year old male with syncope. EXAM: PORTABLE CHEST 1 VIEW COMPARISON:  Chest radiograph dated 10/05/2015 FINDINGS: The lungs are clear. There is no pleural effusion or pneumothorax. The cardiac silhouette is within normal limits. Left pectoral AICD device. No acute osseous pathology. IMPRESSION: No active disease. Electronically Signed   By: Anner Crete M.D.   On: 04/24/2017 03:30   Dg Hips Bilat W Or Wo Pelvis 2 Views  Result Date: 04/24/2017 CLINICAL DATA:  77 year old male with fall and bilateral hip pain. EXAM: DG HIP (WITH OR WITHOUT PELVIS) 2V BILAT COMPARISON:  Radiograph dated 06/05/2016 FINDINGS: There is no acute fracture or dislocation. Mild bilateral hip arthritic changes. Degenerative changes of the lower lumbar spine. Vascular calcification noted. The soft tissues are unremarkable. IMPRESSION: No acute fracture or dislocation. Electronically Signed   By: Anner Crete M.D.   On: 04/24/2017 06:12    Assessment/Plan   ICD-10-CM   1. Neck pain on right side  M54.2    due to cervical spine arthritic changes  2. Chronic combined systolic and diastolic CHF (congestive heart failure) (HCC) I50.42   3. Chronic deep vein thrombosis (DVT) of distal vein of lower extremity, unspecified laterality (HCC) I82.5Z9   4. Other chronic pulmonary embolism without acute cor pulmonale (HCC) I27.82   5. Type 2 diabetes mellitus with stage 3 chronic kidney disease, without long-term current use of insulin (HCC) E11.22    N18.3   6. Seizures  ([place]) R56.9   7. Hemiparesis affecting left side as late effect of cerebrovascular accident (CVA) ([place]) I69.354   8. 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 Communicated with pt and nursing.  Discussed plan with [facility] NP. Time spent >16 min  Will follow  Labs/tests ordered: TSH and free T4 in 4 weeks    Chrystopher Stangl S. CPerlie Gold PNorthwest Medical Centerand Adult Medicine [address] [address]([phone] (Monday-Friday 8 AM - 5 PM) ([phone] 5 PM and follow prompts

## 2017-04-28 NOTE — Telephone Encounter (Signed)
Possible re-admission to facility. This is a patient you were seeing at [facility] . [facility] F/U is needed if patient was re-admitted to facility upon discharge. Hospital discharge from [facility] on 04/25/2017

## 2017-04-28 NOTE — Telephone Encounter (Signed)
RX faxed to [facility] @ [phone], phone number [phone]

## 2017-04-29 LAB — TSH: TSH: 0.71 (ref 0.41–5.90)

## 2017-05-01 LAB — CBC AND DIFFERENTIAL
HEMATOCRIT: 27 — AB (ref 41–53)
Hemoglobin: 8.3 — AB (ref 13.5–17.5)
Neutrophils Absolute: 11
PLATELETS: 254 (ref 150–399)
WBC: 13.9

## 2017-05-01 LAB — HEPATIC FUNCTION PANEL
ALK PHOS: 99 (ref 25–125)
ALT: 32 (ref 10–40)
AST: 26 (ref 14–40)
Bilirubin, Total: 0.5

## 2017-05-01 LAB — BASIC METABOLIC PANEL
BUN: 16 (ref 4–21)
CREATININE: 1 (ref <=1.3)
Glucose: 99
Potassium: 4.1 (ref 3.4–5.3)
Sodium: 141 (ref 137–147)

## 2017-05-08 LAB — TSH: TSH: 1.77 (ref 0.41–5.90)

## 2017-05-09 LAB — CBC AND DIFFERENTIAL
HEMATOCRIT: 26 — AB (ref 41–53)
HEMOGLOBIN: 8.1 — AB (ref 13.5–17.5)
NEUTROS ABS: 9
Platelets: 502 — AB (ref 150–399)
WBC: 12

## 2017-05-09 LAB — BASIC METABOLIC PANEL
BUN: 11 (ref 4–21)
Creatinine: 1 (ref 0.6–1.3)
Glucose: 106
POTASSIUM: 3.6 (ref 3.4–5.3)
SODIUM: 141 (ref 137–147)

## 2017-06-11 LAB — CBC AND DIFFERENTIAL
HEMATOCRIT: 27 — AB (ref 41–53)
HEMOGLOBIN: 8 — AB (ref 13.5–17.5)
NEUTROS ABS: 8
PLATELETS: 253 (ref 150–399)
WBC: 10.2

## 2017-06-11 LAB — HEPATIC FUNCTION PANEL
ALK PHOS: 92 (ref 25–125)
ALT: 14 (ref 10–40)
AST: 19 (ref 14–40)
BILIRUBIN, TOTAL: 0.2

## 2017-06-11 LAB — BASIC METABOLIC PANEL
BUN: 25 — AB (ref 4–21)
Creatinine: 1.1 (ref 0.6–1.3)
Glucose: 186
Potassium: 4.7 (ref 3.4–5.3)
SODIUM: 139 (ref 137–147)

## 2017-06-14 LAB — BASIC METABOLIC PANEL
BUN: 23 — AB (ref 4–21)
Creatinine: 1.1 (ref 0.6–1.3)
Glucose: 186
Potassium: 4.7 (ref 3.4–5.3)
SODIUM: 140 (ref 137–147)

## 2017-06-19 ENCOUNTER — Non-Acute Institutional Stay (SKILLED_NURSING_FACILITY): Payer: Medicare Other | Admitting: Internal Medicine

## 2017-06-19 ENCOUNTER — Encounter: Payer: Self-pay | Admitting: Internal Medicine

## 2017-06-19 DIAGNOSIS — R634 Abnormal weight loss: Secondary | ICD-10-CM | POA: Diagnosis not present

## 2017-06-19 DIAGNOSIS — F0150 Vascular dementia without behavioral disturbance: Secondary | ICD-10-CM

## 2017-06-19 DIAGNOSIS — Z8673 Personal history of transient ischemic attack (TIA), and cerebral infarction without residual deficits: Secondary | ICD-10-CM

## 2017-06-19 DIAGNOSIS — N183 Chronic kidney disease, stage 3 unspecified: Secondary | ICD-10-CM

## 2017-06-19 DIAGNOSIS — I2782 Chronic pulmonary embolism: Secondary | ICD-10-CM | POA: Diagnosis not present

## 2017-06-19 DIAGNOSIS — R569 Unspecified convulsions: Secondary | ICD-10-CM | POA: Diagnosis not present

## 2017-06-19 DIAGNOSIS — J449 Chronic obstructive pulmonary disease, unspecified: Secondary | ICD-10-CM | POA: Diagnosis not present

## 2017-06-19 DIAGNOSIS — E1122 Type 2 diabetes mellitus with diabetic chronic kidney disease: Secondary | ICD-10-CM

## 2017-06-19 NOTE — Progress Notes (Signed)
Patient ID: Grant Lara, male   DOB: 06/16/1940, 77 y.o.   MRN: [id number]   Location:  [place] Room Number: [place] of Service:  SNF (31) Provider:  [place], [place], DO  Patient Care Team: Gildardo Cranker, DO as PCP - General (Internal Medicine) [facility], [place] ([place])  Extended Emergency Contact Information Primary Emergency Contact: Trombetta,Curtis          [place], [place] Phone: [phone] Relation: Son Secondary Emergency Contact: Okazaki,[name] Address: [address]          [place], [place] [address] [name] of [place] Mobile Phone: [phone] Relation: Spouse  Code Status:  DNR Goals of care: Advanced Directive information Advanced Directives 06/19/2017  Does Patient Have a Medical Advance Directive? Yes  Type of Advance Directive Out of facility DNR (pink MOST or yellow form)  Does patient want to make changes to medical advance directive? No - Patient declined  Copy of [place] in Chart? -  Would patient like information on creating a medical advance directive? No - Patient declined  Pre-existing out of facility DNR order (yellow form or pink MOST form) Pink MOST form placed in chart (order not valid for inpatient use)     Chief Complaint  Patient presents with  . Medical Management of Chronic Issues    [facility]    HPI:  Pt is a 77 y.o. male long term resident seen today for medical management of chronic diseases.  He has no concerns. Weight down 12 lbs. No falls. Appetite reduced and sleeps well. He is a poor historian due to dementia. Hx obtained from chart.  Seizure d/o - controlled on keppra 500 mg twice daily; no recent sz activity  DM - diet controlled. A1c 5.9%; not on ACEI/ARB 2/2 low BPs  Bilateral lower extremity pain - controlled on tylenol  1 gm twice daily and ultram 50 mg twice daily  Dyslipidemia - stable off lipitor 2/2 weight  loss; LDL 67   Dementia - progressive; he takes aricept 10 mg nightly. Weight down 12 lbs. Albumin 3  BPH - stable on flomax 0.4 mg daily   Chronic diastoic heart failure - EF 30-35% (10-06-15); s/p AICD placement (interrogated; now pace maker function only). Stable without medication. He takes ASA daily  CAD - no angina. Stable on ASA 81 mg daily  COPD - sx's stable without medication. No recent exacerbation   Depression - controlled on zoloft 75 mg daily; no longer on trazodone 50 mg nightly; GDR done 03-05-17  CKD  - stage 3. Stable. 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   Crohn's disease - he has constipation that is stable on senna daily   Hypotension - improved. Current BP 124/68   Past Medical History:  Diagnosis Date  . AICD (automatic cardioverter/defibrillator) present   . Arthritis    "used to have a touch in my legs"  . Bradycardia 06/01/2014  . CAD (coronary artery disease)    LHC 9/05 with Dr. Einar Gip:  dLM 20-30%, LAD 85%, oD1 20-30%.  PCI:  Taxus DES to LAD; Dx jailed and tx with POBA.  Last myoview 12/10: inf scar, no ischemia, EF 29%.  . CHF (congestive heart failure) ([place])   .  Crohn's disease ([place])   . Dementia   . Diabetes mellitus    11/05/11 "borderline; don't take medications"  . Dilated cardiomyopathy ([place])    2/12: EF 30-35%, trivial AI, mild RAE.  EF 2014 40 -45%  . DVT (deep venous thrombosis) ([place])   . HCAP (healthcare-associated pneumonia) 09/29/2015  . HLD (hyperlipidemia)   . 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 Hyperlipidemia   . Hypertension   . Memory difficulties   . Nephrolithiasis   . PFO (patent foramen ovale)    Not mentioned on 2014 echo.  . Pneumonia ~ 2011   09-22-13 denies any recent SOB or breathing problems  . Pulmonary embolus (HCC)    chronic coumadin  . Stroke [facility]) 1993   "left arm can't hold steady; leg too"   . Swelling of both ankles     09-22-13 occ.feet, but denies pain.   Past Surgical History:  Procedure Laterality Date  . APPENDECTOMY    . COLON SURGERY  1994; 1996   "for Crohn's disease"  . IMPLANTABLE CARDIOVERTER DEFIBRILLATOR IMPLANT N/A 06/02/2014   Procedure: IMPLANTABLE CARDIOVERTER DEFIBRILLATOR IMPLANT;  Surgeon: [name], MD;  [facility] LAB;  Service: Cardiovascular;  Laterality: N/A;    Allergies  Allergen Reactions  . Tuberculin Tests Other (See Comments)    Per MAR (no description provided)    Outpatient Encounter Medications as of 06/19/2017  Medication Sig  . acetaminophen (TYLENOL) 325 MG tablet Take 650 mg by mouth every 6 (six) hours as needed for mild pain or fever.  [name] aspirin 81 MG tablet Take 81 mg by mouth daily.  [name] donepezil (ARICEPT) 10 MG tablet Take 10 mg by mouth at bedtime.   . ENSURE (ENSURE) Take 120 mLs by mouth. Give 120cc by mouth three times daily  . gabapentin (NEURONTIN) 100 MG capsule Take 100 mg by mouth at bedtime.  [name] ipratropium-albuterol (DUONEB) 0.5-2.5 (3) MG/3ML SOLN Take 3 mLs by nebulization every 6 (six) hours as needed.  . latanoprost (XALATAN) 0.005 % ophthalmic solution Place 1 drop into both eyes at bedtime.  . levETIRAcetam (KEPPRA) 500 MG tablet Take 1 tablet (500 mg total) by mouth 2 (two) times daily.  [name] loperamide (IMODIUM A-D) 2 MG tablet Take 2 mg by mouth every 4 (four) hours as needed for diarrhea or loose stools. X 14 days  . mirtazapine (REMERON) 7.5 MG tablet Take 7.5 mg by mouth at bedtime.  . rivaroxaban (XARELTO) 20 MG TABS tablet Take 20 mg by mouth at bedtime.  . senna (SENOKOT) 8.6 MG TABS tablet Take 1 tablet by mouth every morning.  . sertraline (ZOLOFT) 100 MG tablet Take 75 mg by mouth daily.  . tamsulosin (FLOMAX) 0.4 MG CAPS capsule Take 0.4 mg by mouth at bedtime.  . traMADol (ULTRAM) 50 MG tablet Take 1 tablet (50 mg total) by mouth 3 (three) times daily. Hold for sedation  . [DISCONTINUED]  Nutritional Supplements (NUTRITIONAL SUPPLEMENT PO) Take 120 mLs by mouth 3 (three) times daily with meals. House supplement - House Shake   No facility-administered encounter medications on file as of 06/19/2017.     Review of Systems  Unable to perform ROS: Dementia    Immunization History  Administered Date(s) Administered  . Influenza Whole 02/09/2010  . Influenza-Unspecified 01/21/2016  . PPD Test 11/05/2011, 10/09/2015  . Pneumococcal Polysaccharide-23 02/19/2010  . Tdap 06/05/2016   Pertinent  Health Maintenance Due  Topic Date Due  . INFLUENZA VACCINE  11/21/2017 (Originally 11/13/2016)  .  PNA vac Low Risk Adult (2 of 2 - PCV13) 04/16/2023 (Originally 02/20/2011)  . HEMOGLOBIN A1C  06/23/2017  . URINE MICROALBUMIN  11/12/2017  . FOOT EXAM  12/23/2017  . OPHTHALMOLOGY EXAM  03/20/2018   Fall Risk  11/11/2016  Falls in the past year? Yes  Number falls in past yr: 2 or more  Injury with Fall? Yes   Functional Status Survey:    Vitals:   06/19/17 1106  BP: 104/74  Pulse: 68  Resp: 16  Temp: 98.5 F (36.9 C)  SpO2: 94%  Weight: 108 lb 8 oz (49.2 kg)  Height: 5' 3"  (1.6 m)   Body mass index is 19.22 kg/m. Physical Exam  Constitutional: He appears well-developed.  Frail appearing in NAD, quiet spoken  HENT:  Mouth/Throat: Oropharynx is clear and moist.  MMM; no oral thrush  Eyes: Pupils are equal, round, and reactive to light. No scleral icterus.  Neck: Neck supple. Carotid bruit is not present. No thyromegaly present.  Cardiovascular: Normal rate, regular rhythm and intact distal pulses. Exam reveals no gallop and no friction rub.  Murmur (1/6 SEM) heard. No distal LE edema. No calf TTP  Pulmonary/Chest: Effort normal. He has decreased breath sounds (b/l at base). He has no wheezes. He has no rales. He exhibits no tenderness.  Abdominal: Soft. Normal appearance and bowel sounds are normal. He exhibits no distension, no abdominal bruit, no pulsatile midline mass  and no mass. There is no hepatomegaly. There is no tenderness. There is no rigidity, no rebound and no guarding. No hernia.  Musculoskeletal: He exhibits edema (small and large joints).  Lymphadenopathy:    He has no cervical adenopathy.  Neurological: He is alert.  Skin: Skin is warm and dry. No rash noted.  Psychiatric: He has a normal mood and affect. His behavior is normal. Thought content normal.    Labs reviewed: Recent Labs    04/24/17 1818 04/25/17 0011 04/25/17 0815 04/28/17 05/01/17 06/14/17  NA 136 135 139 144 141 140  K 4.5 4.6 4.9 4.3 4.1 4.7  CL 108 108 110  --   --   --   CO2 20* 21* 19*  --   --   --   GLUCOSE 184* 108* 105*  --   --   --   BUN 30* 25* 22* 18 16 23*  CREATININE 2.47* 1.88* 1.54* 1.1 1.0 1.1  CALCIUM 7.9* 7.8* 8.5*  --   --   --   MG 1.7  --   --   --   --   --    Recent Labs    04/24/17 0325 04/24/17 1818 04/28/17 05/01/17  AST 26 40 15 26  ALT 13* 17 13 32  ALKPHOS 81 69 80 99  BILITOT 0.5 0.6  --   --   PROT 6.6 5.8*  --   --   ALBUMIN 3.0* 2.6*  --   --    Recent Labs    04/24/17 0325  04/24/17 1818 04/25/17 0815 04/28/17 05/01/17  WBC 11.2*  --  10.4 10.4 13.6 13.9  NEUTROABS 9.6*  --  7.5  --  11 11  HGB 9.7*   < > 8.4* 9.3* 8.6* 8.3*  HCT 32.9*   < > 29.6* 33.5* 28* 27*  MCV 73.1*  --  72.4* 72.8*  --   --   PLT 229  --  213 220 240 254   < > = values in this interval not displayed.  Lab Results  Component Value Date   TSH 1.77 05/08/2017   Lab Results  Component Value Date   HGBA1C 6.4 07/31/2016   Lab Results  Component Value Date   CHOL 157 04/01/2016   HDL 66 04/01/2016   LDLCALC 67 04/01/2016   TRIG 119 04/01/2016   CHOLHDL 3.1 04/16/2014    Significant Diagnostic Results in last 30 days:  No results found.  Assessment/Plan   ICD-10-CM   1. Weight loss, non-intentional R63.4   2. Vascular dementia without behavioral disturbance F01.50   3. History of stroke Z86.73   4. Type 2 diabetes mellitus with  stage 3 chronic kidney disease, without long-term current use of insulin (HCC) E11.22    N18.3   5. Chronic obstructive pulmonary disease, unspecified COPD type ([place]) J44.9   6. Other chronic pulmonary embolism without acute cor pulmonale (HCC) I27.82   7. Seizures ([place]) R56.9     Recommend nutritional supplements per facility protocol due to weight loss  Cont other meds as ordered  PT/OT/ST as indicated  F/u with specialists as indicated  [facility] NP to follow  Will follow  Labs/tests ordered: none   Rynn Markiewicz S. Perlie Gold  [facility] [address] [place], [place] [address] [phone] Cell (Monday-Friday 8 AM - 5 PM) [phone] After 5 PM and follow prompts

## 2017-06-22 ENCOUNTER — Emergency Department (HOSPITAL_COMMUNITY): Payer: Medicare Other

## 2017-06-22 ENCOUNTER — Inpatient Hospital Stay (HOSPITAL_COMMUNITY)
Admission: EM | Admit: 2017-06-22 | Discharge: 2017-06-26 | DRG: 811 | Disposition: A | Discharge status: Skilled Nursing Facility | Payer: Medicare Other | Attending: Internal Medicine | Admitting: Internal Medicine

## 2017-06-22 ENCOUNTER — Encounter (HOSPITAL_COMMUNITY): Payer: Self-pay | Admitting: Emergency Medicine

## 2017-06-22 DIAGNOSIS — D631 Anemia in chronic kidney disease: Secondary | ICD-10-CM | POA: Diagnosis present

## 2017-06-22 DIAGNOSIS — Z86718 Personal history of other venous thrombosis and embolism: Secondary | ICD-10-CM | POA: Diagnosis not present

## 2017-06-22 DIAGNOSIS — K50911 Crohn's disease, unspecified, with rectal bleeding: Secondary | ICD-10-CM | POA: Diagnosis present

## 2017-06-22 DIAGNOSIS — N183 Chronic kidney disease, stage 3 unspecified: Secondary | ICD-10-CM | POA: Diagnosis present

## 2017-06-22 DIAGNOSIS — I2510 Atherosclerotic heart disease of native coronary artery without angina pectoris: Secondary | ICD-10-CM | POA: Diagnosis present

## 2017-06-22 DIAGNOSIS — I420 Dilated cardiomyopathy: Secondary | ICD-10-CM | POA: Diagnosis present

## 2017-06-22 DIAGNOSIS — D600 Chronic acquired pure red cell aplasia: Secondary | ICD-10-CM | POA: Diagnosis not present

## 2017-06-22 DIAGNOSIS — E43 Unspecified severe protein-calorie malnutrition: Secondary | ICD-10-CM | POA: Diagnosis present

## 2017-06-22 DIAGNOSIS — Z7901 Long term (current) use of anticoagulants: Secondary | ICD-10-CM

## 2017-06-22 DIAGNOSIS — Z888 Allergy status to other drugs, medicaments and biological substances status: Secondary | ICD-10-CM | POA: Diagnosis not present

## 2017-06-22 DIAGNOSIS — F0150 Vascular dementia without behavioral disturbance: Secondary | ICD-10-CM | POA: Diagnosis present

## 2017-06-22 DIAGNOSIS — I6930 Unspecified sequelae of cerebral infarction: Secondary | ICD-10-CM | POA: Diagnosis not present

## 2017-06-22 DIAGNOSIS — S12401A Unspecified nondisplaced fracture of fifth cervical vertebra, initial encounter for closed fracture: Secondary | ICD-10-CM

## 2017-06-22 DIAGNOSIS — S12401D Unspecified nondisplaced fracture of fifth cervical vertebra, subsequent encounter for fracture with routine healing: Secondary | ICD-10-CM | POA: Diagnosis not present

## 2017-06-22 DIAGNOSIS — Z86711 Personal history of pulmonary embolism: Secondary | ICD-10-CM

## 2017-06-22 DIAGNOSIS — F0151 Vascular dementia with behavioral disturbance: Secondary | ICD-10-CM | POA: Diagnosis present

## 2017-06-22 DIAGNOSIS — Z79899 Other long term (current) drug therapy: Secondary | ICD-10-CM

## 2017-06-22 DIAGNOSIS — I110 Hypertensive heart disease with heart failure: Secondary | ICD-10-CM | POA: Diagnosis not present

## 2017-06-22 DIAGNOSIS — D62 Acute posthemorrhagic anemia: Secondary | ICD-10-CM | POA: Diagnosis present

## 2017-06-22 DIAGNOSIS — Z7982 Long term (current) use of aspirin: Secondary | ICD-10-CM

## 2017-06-22 DIAGNOSIS — Y92129 Unspecified place in nursing home as the place of occurrence of the external cause: Secondary | ICD-10-CM

## 2017-06-22 DIAGNOSIS — I130 Hypertensive heart and chronic kidney disease with heart failure and stage 1 through stage 4 chronic kidney disease, or unspecified chronic kidney disease: Secondary | ICD-10-CM | POA: Diagnosis present

## 2017-06-22 DIAGNOSIS — Z95810 Presence of automatic (implantable) cardiac defibrillator: Secondary | ICD-10-CM | POA: Diagnosis not present

## 2017-06-22 DIAGNOSIS — Z66 Do not resuscitate: Secondary | ICD-10-CM | POA: Diagnosis present

## 2017-06-22 DIAGNOSIS — I255 Ischemic cardiomyopathy: Secondary | ICD-10-CM | POA: Diagnosis present

## 2017-06-22 DIAGNOSIS — S0181XA Laceration without foreign body of other part of head, initial encounter: Secondary | ICD-10-CM | POA: Diagnosis present

## 2017-06-22 DIAGNOSIS — F17210 Nicotine dependence, cigarettes, uncomplicated: Secondary | ICD-10-CM | POA: Diagnosis present

## 2017-06-22 DIAGNOSIS — Q211 Atrial septal defect: Secondary | ICD-10-CM | POA: Diagnosis not present

## 2017-06-22 DIAGNOSIS — I2699 Other pulmonary embolism without acute cor pulmonale: Secondary | ICD-10-CM | POA: Diagnosis present

## 2017-06-22 DIAGNOSIS — D649 Anemia, unspecified: Secondary | ICD-10-CM

## 2017-06-22 DIAGNOSIS — I6990 Unspecified sequelae of unspecified cerebrovascular disease: Secondary | ICD-10-CM

## 2017-06-22 DIAGNOSIS — W1830XA Fall on same level, unspecified, initial encounter: Secondary | ICD-10-CM | POA: Diagnosis present

## 2017-06-22 DIAGNOSIS — J449 Chronic obstructive pulmonary disease, unspecified: Secondary | ICD-10-CM | POA: Diagnosis present

## 2017-06-22 DIAGNOSIS — I82409 Acute embolism and thrombosis of unspecified deep veins of unspecified lower extremity: Secondary | ICD-10-CM | POA: Diagnosis present

## 2017-06-22 DIAGNOSIS — I5020 Unspecified systolic (congestive) heart failure: Secondary | ICD-10-CM | POA: Diagnosis present

## 2017-06-22 DIAGNOSIS — E1122 Type 2 diabetes mellitus with diabetic chronic kidney disease: Secondary | ICD-10-CM | POA: Diagnosis present

## 2017-06-22 DIAGNOSIS — R634 Abnormal weight loss: Secondary | ICD-10-CM | POA: Diagnosis present

## 2017-06-22 DIAGNOSIS — L8990 Pressure ulcer of unspecified site, unspecified stage: Secondary | ICD-10-CM | POA: Diagnosis present

## 2017-06-22 DIAGNOSIS — W19XXXA Unspecified fall, initial encounter: Secondary | ICD-10-CM

## 2017-06-22 DIAGNOSIS — D638 Anemia in other chronic diseases classified elsewhere: Secondary | ICD-10-CM | POA: Diagnosis present

## 2017-06-22 LAB — CBC WITH DIFFERENTIAL/PLATELET
BASOS ABS: 0 10*3/uL (ref 0.0–0.1)
BASOS PCT: 0 %
EOS ABS: 0.1 10*3/uL (ref 0.0–0.7)
EOS PCT: 1 %
HCT: 21.4 % — ABNORMAL LOW (ref 39.0–52.0)
Hemoglobin: 6.1 g/dL — CL (ref 13.0–17.0)
Lymphocytes Relative: 15 %
Lymphs Abs: 1.5 10*3/uL (ref 0.7–4.0)
MCH: 21.1 pg — ABNORMAL LOW (ref 26.0–34.0)
MCHC: 28.5 g/dL — ABNORMAL LOW (ref 30.0–36.0)
MCV: 74 fL — ABNORMAL LOW (ref 78.0–100.0)
MONO ABS: 0.5 10*3/uL (ref 0.1–1.0)
Monocytes Relative: 5 %
Neutro Abs: 8 10*3/uL — ABNORMAL HIGH (ref 1.7–7.7)
Neutrophils Relative %: 79 %
PLATELETS: 381 10*3/uL (ref 150–400)
RBC: 2.89 MIL/uL — ABNORMAL LOW (ref 4.22–5.81)
RDW: 20.5 % — AB (ref 11.5–15.5)
WBC: 10.1 10*3/uL (ref 4.0–10.5)

## 2017-06-22 LAB — BASIC METABOLIC PANEL
ANION GAP: 11 (ref 5–15)
BUN: 14 mg/dL (ref 6–20)
CALCIUM: 8.8 mg/dL — AB (ref 8.9–10.3)
CO2: 23 mmol/L (ref 22–32)
CREATININE: 1.17 mg/dL (ref 0.61–1.24)
Chloride: 105 mmol/L (ref 101–111)
GFR calc non Af Amer: 58 mL/min — ABNORMAL LOW (ref >60)
GLUCOSE: 104 mg/dL — AB (ref 65–99)
Potassium: 4.8 mmol/L (ref 3.5–5.1)
Sodium: 139 mmol/L (ref 135–145)

## 2017-06-22 LAB — I-STAT CHEM 8, ED
BUN: 18 mg/dL (ref 6–20)
CALCIUM ION: 1.13 mmol/L — AB (ref 1.15–1.40)
CREATININE: 1.1 mg/dL (ref 0.61–1.24)
Chloride: 104 mmol/L (ref 101–111)
GLUCOSE: 110 mg/dL — AB (ref 65–99)
HCT: 20 % — ABNORMAL LOW (ref 39.0–52.0)
HEMOGLOBIN: 6.8 g/dL — AB (ref 13.0–17.0)
Potassium: 4.7 mmol/L (ref 3.5–5.1)
Sodium: 140 mmol/L (ref 135–145)
TCO2: 28 mmol/L (ref 22–32)

## 2017-06-22 LAB — POC OCCULT BLOOD, ED: Fecal Occult Bld: POSITIVE — AB

## 2017-06-22 LAB — PREPARE RBC (CROSSMATCH)

## 2017-06-22 LAB — HEMOGLOBIN AND HEMATOCRIT, BLOOD
HCT: 25.2 % — ABNORMAL LOW (ref 39.0–52.0)
HEMOGLOBIN: 7.3 g/dL — AB (ref 13.0–17.0)

## 2017-06-22 LAB — MAGNESIUM: MAGNESIUM: 1.8 mg/dL (ref 1.7–2.4)

## 2017-06-22 MED ORDER — SODIUM CHLORIDE 0.9% FLUSH
3.0000 mL | Freq: Two times a day (BID) | INTRAVENOUS | Status: DC
  Administered 2017-06-22 – 2017-06-26 (×6): 3 mL via INTRAVENOUS

## 2017-06-22 MED ORDER — GABAPENTIN 100 MG PO CAPS
100.0000 mg | ORAL_CAPSULE | Freq: Every day | ORAL | Status: DC
  Administered 2017-06-22 – 2017-06-25 (×4): 100 mg via ORAL
  Filled 2017-06-22 (×4): qty 1

## 2017-06-22 MED ORDER — MIRTAZAPINE 15 MG PO TABS
7.5000 mg | ORAL_TABLET | Freq: Every day | ORAL | Status: DC
  Administered 2017-06-22 – 2017-06-25 (×4): 7.5 mg via ORAL
  Filled 2017-06-22 (×4): qty 1

## 2017-06-22 MED ORDER — DONEPEZIL HCL 10 MG PO TABS
10.0000 mg | ORAL_TABLET | Freq: Every day | ORAL | Status: DC
  Administered 2017-06-22 – 2017-06-25 (×4): 10 mg via ORAL
  Filled 2017-06-22 (×4): qty 1

## 2017-06-22 MED ORDER — SERTRALINE HCL 50 MG PO TABS
75.0000 mg | ORAL_TABLET | Freq: Every day | ORAL | Status: DC
  Administered 2017-06-22 – 2017-06-26 (×5): 75 mg via ORAL
  Filled 2017-06-22 (×5): qty 1

## 2017-06-22 MED ORDER — LIDOCAINE-EPINEPHRINE (PF) 2 %-1:200000 IJ SOLN
20.0000 mL | Freq: Once | INTRAMUSCULAR | Status: AC
  Administered 2017-06-22: 20 mL via INTRADERMAL
  Filled 2017-06-22: qty 20

## 2017-06-22 MED ORDER — SODIUM CHLORIDE 0.9 % IV SOLN
INTRAVENOUS | Status: DC
  Administered 2017-06-22 – 2017-06-23 (×2): via INTRAVENOUS
  Administered 2017-06-23: 1000 mL via INTRAVENOUS
  Administered 2017-06-24: 11:00:00 via INTRAVENOUS

## 2017-06-22 MED ORDER — ACETAMINOPHEN 325 MG PO TABS
650.0000 mg | ORAL_TABLET | Freq: Four times a day (QID) | ORAL | Status: DC | PRN
Start: 2017-06-22 — End: 2017-06-26

## 2017-06-22 MED ORDER — OXYCODONE HCL 5 MG PO TABS
5.0000 mg | ORAL_TABLET | ORAL | Status: DC | PRN
  Administered 2017-06-26: 5 mg via ORAL
  Filled 2017-06-22: qty 1

## 2017-06-22 MED ORDER — LATANOPROST 0.005 % OP SOLN
1.0000 [drp] | Freq: Every day | OPHTHALMIC | Status: DC
  Administered 2017-06-22 – 2017-06-25 (×4): 1 [drp] via OPHTHALMIC
  Filled 2017-06-22 (×2): qty 2.5

## 2017-06-22 MED ORDER — SODIUM CHLORIDE 0.9 % IV BOLUS (SEPSIS)
250.0000 mL | Freq: Once | INTRAVENOUS | Status: AC
  Administered 2017-06-22: 250 mL via INTRAVENOUS

## 2017-06-22 MED ORDER — ONDANSETRON HCL 4 MG PO TABS: 4.0000 mg | ORAL_TABLET | Freq: Four times a day (QID) | ORAL | Status: DC | PRN

## 2017-06-22 MED ORDER — ONDANSETRON HCL 4 MG/2ML IJ SOLN: 4.0000 mg | Freq: Four times a day (QID) | INTRAMUSCULAR | Status: DC | PRN

## 2017-06-22 MED ORDER — LEVETIRACETAM 500 MG PO TABS
500.0000 mg | ORAL_TABLET | Freq: Two times a day (BID) | ORAL | Status: DC
  Administered 2017-06-22 – 2017-06-26 (×9): 500 mg via ORAL
  Filled 2017-06-22 (×9): qty 1

## 2017-06-22 MED ORDER — FUROSEMIDE 10 MG/ML IJ SOLN
40.0000 mg | Freq: Once | INTRAMUSCULAR | Status: AC
  Administered 2017-06-22: 40 mg via INTRAVENOUS
  Filled 2017-06-22: qty 4

## 2017-06-22 MED ORDER — ACETAMINOPHEN 650 MG RE SUPP: 650.0000 mg | Freq: Four times a day (QID) | RECTAL | Status: DC | PRN

## 2017-06-22 MED ORDER — SODIUM CHLORIDE 0.9 % IV SOLN: 10.0000 mL/h | Freq: Once | INTRAVENOUS | Status: DC

## 2017-06-22 MED ORDER — IPRATROPIUM-ALBUTEROL 0.5-2.5 (3) MG/3ML IN SOLN: 3.0000 mL | Freq: Four times a day (QID) | RESPIRATORY_TRACT | Status: DC | PRN

## 2017-06-22 MED ORDER — TAMSULOSIN HCL 0.4 MG PO CAPS
0.4000 mg | ORAL_CAPSULE | Freq: Every day | ORAL | Status: DC
  Administered 2017-06-22 – 2017-06-25 (×4): 0.4 mg via ORAL
  Filled 2017-06-22 (×4): qty 1

## 2017-06-22 MED ORDER — TRAMADOL HCL 50 MG PO TABS
50.0000 mg | ORAL_TABLET | Freq: Three times a day (TID) | ORAL | Status: DC
  Administered 2017-06-22 – 2017-06-26 (×12): 50 mg via ORAL
  Filled 2017-06-22 (×13): qty 1

## 2017-06-22 NOTE — ED Provider Notes (Signed)
[facility] EMERGENCY DEPARTMENT Provider Note   CSN: [id number] Arrival date & time: 06/22/17  0549     History   Chief Complaint Chief Complaint  Patient presents with  . Fall    HPI Grant Lara is a 77 y.o. male.  77 yo M with a chief complaint of a fall.  Per the nursing home the patient fell from standing this morning.  Was found face first on the ground.  The patient does not remember the event.  Complaining of headache and neck pain.  He denies chest pain abdominal pain back pain.  He states that his side hurts but is unable to tell me which one it is.  Level 5 caveat dementia.   The history is provided by the patient.  Fall  This is a new problem. The current episode started 1 to 2 hours ago. The problem occurs constantly. The problem has not changed since onset.Associated symptoms include headaches. Pertinent negatives include no chest pain, no abdominal pain and no shortness of breath. Nothing aggravates the symptoms. Nothing relieves the symptoms. He has tried nothing for the symptoms. The treatment provided no relief.    Past Medical History:  Diagnosis Date  . AICD (automatic cardioverter/defibrillator) present   . Arthritis    "used to have a touch in my legs"  . Bradycardia 06/01/2014  . CAD (coronary artery disease)    LHC 9/05 with Dr. Einar Gip:  dLM 20-30%, LAD 85%, oD1 20-30%.  PCI:  Taxus DES to LAD; Dx jailed and tx with POBA.  Last myoview 12/10: inf scar, no ischemia, EF 29%.  . CHF (congestive heart failure) ([place])   . Crohn's disease ([place])   . Dementia   . Diabetes mellitus    11/05/11 "borderline; don't take medications"  . Dilated cardiomyopathy ([place])    2/12: EF 30-35%, trivial AI, mild RAE.  EF 2014 40 -45%  . DVT (deep venous thrombosis) ([place])   . HCAP (healthcare-associated pneumonia) 09/29/2015  . HLD (hyperlipidemia)   . 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 Hyperlipidemia   . Hypertension   . Memory difficulties   .  Nephrolithiasis   . PFO (patent foramen ovale)    Not mentioned on 2014 echo.  . Pneumonia ~ 2011   09-22-13 denies any recent SOB or breathing problems  . Pulmonary embolus (HCC)    chronic coumadin  . Stroke [facility]) 1993   "left arm can't hold steady; leg too"  . Swelling of both ankles     09-22-13 occ.feet, but denies pain.    Patient Active Problem List   Diagnosis Date Noted  . Syncope 04/24/2017  . ARF (acute renal failure) ([place]) 04/24/2017  . Dyslipidemia associated with type 2 diabetes mellitus ([place]) 03/09/2017  . Hemiparesis affecting left side as late effect of cerebrovascular accident (CVA) ([place]) 02/10/2017  . Chronic low back pain 01/16/2017  . Diabetic peripheral neuropathy associated with type 2 diabetes mellitus ([place]) 01/16/2017  . Weight loss, non-intentional 12/05/2016  . Vascular dementia with behavior disturbance 09/09/2016  . Allergic rhinitis 09/04/2016  . Seizures ([place]) 03/29/2016  . DM type 2 causing CKD stage 3 ([place]) 09/30/2015  . ICD (implantable cardioverter-defibrillator) in place 09/29/2015  . Vascular dementia without behavioral disturbance 09/25/2015  . OA (osteoarthritis) 06/06/2015  . Bilateral lower extremity pain 05/14/2015  . BPH (benign prostatic hyperplasia) 04/10/2015  . Depression with anxiety 04/10/2015  . FTT (failure to thrive) in adult 11/05/2014  . Protein-calorie malnutrition, severe ([place]) 10/30/2014  .  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 Chronic combined systolic and diastolic CHF (congestive heart failure) ([place])   . Hypotension 07/16/2014  . Pulmonary embolus ([place]) 07/16/2014  . Late effects of CVA (cerebrovascular accident) 07/16/2014  . Cerebral infarction ([place])   . Hyperlipidemia   . Coronary artery disease involving native coronary artery of native heart without angina pectoris   . CKD (chronic kidney disease) stage 3, GFR 30-59 ml/min (HCC) 04/16/2014  . Anemia, chronic disease 11/21/2013  . Crohn's disease ([place])  02/16/2012  . DVT (deep venous thrombosis) ([place]) 06/05/2010  . COPD (chronic obstructive pulmonary disease) ([place]) 04/05/2010  . Hypertensive heart disease with CHF (congestive heart failure) ([place]) 02/10/2009  . Dilated cardiomyopathy ([place]) 12/12/2008    Past Surgical History:  Procedure Laterality Date  . APPENDECTOMY    . COLON SURGERY  1994; 1996   "for Crohn's disease"  . IMPLANTABLE CARDIOVERTER DEFIBRILLATOR IMPLANT N/A 06/02/2014   Procedure: IMPLANTABLE CARDIOVERTER DEFIBRILLATOR IMPLANT;  Surgeon: [name], MD;  Location: [facility] C[facility] LAB;  Service: Cardiovascular;  Laterality: N/A;       Home Medications    Prior to Admission medications   Medication Sig Start Date End Date Taking? Authorizing Provider  acetaminophen (TYLENOL) 325 MG tablet Take 650 mg by mouth every 6 (six) hours as needed for mild pain or fever. 05/01/17  Yes [provider]  aspirin 81 MG tablet Take 81 mg by mouth daily.   Yes [provider]  donepezil (ARICEPT) 10 MG tablet Take 10 mg by mouth at bedtime.  02/23/15  Yes [provider]  gabapentin (NEURONTIN) 100 MG capsule Take 100 mg by mouth at bedtime.   Yes [provider]  ipratropium-albuterol (DUONEB) 0.5-2.5 (3) MG/3ML SOLN Take 3 mLs by nebulization every 6 (six) hours as needed (for breathing).    Yes [provider]  latanoprost (XALATAN) 0.005 % ophthalmic solution Place 1 drop into both eyes at bedtime. 06/29/13  Yes [provider]  levETIRAcetam (KEPPRA) 500 MG tablet Take 1 tablet (500 mg total) by mouth 2 (two) times daily. 03/28/16  Yes Hosie Poisson, MD  loperamide (IMODIUM A-D) 2 MG tablet Take 2 mg by mouth every 4 (four) hours as needed for diarrhea or loose stools. X 14 days 06/16/17 06/30/17 Yes [provider]  mirtazapine (REMERON) 7.5 MG tablet Take 7.5 mg by mouth at bedtime.   Yes [provider]  rivaroxaban (XARELTO) 20 MG TABS tablet Take 20 mg by mouth  at bedtime.   Yes [provider]  senna (SENOKOT) 8.6 MG TABS tablet Take 1 tablet by mouth every morning.   Yes [provider]  sertraline (ZOLOFT) 50 MG tablet Take 75 mg by mouth daily. 04/23/17  Yes [provider]  tamsulosin (FLOMAX) 0.4 MG CAPS capsule Take 0.4 mg by mouth at bedtime.   Yes [provider]  traMADol (ULTRAM) 50 MG tablet Take 1 tablet (50 mg total) by mouth 3 (three) times daily. Hold for sedation 04/28/17  Yes Gildardo Cranker, DO    Family History Family History  Problem Relation Age of Onset  . Diabetes type II Mother   . CAD Father   . Diabetes type II Brother     Social History Social History   Tobacco Use  . Smoking status: Current Some Day Smoker    Packs/day: 0.50    Years: 53.00    Pack years: 26.50    Types: Cigarettes  . Smokeless tobacco: Never Used  Substance Use Topics  . Alcohol use: No    Alcohol/week: 0.0 oz  . Drug use: No     Allergies   Tuberculin tests   Review of Systems Review of Systems  Unable to perform ROS: Dementia  Constitutional: Negative for chills and fever.  HENT: Negative for congestion and facial swelling.   Eyes: Negative for discharge and visual disturbance.  Respiratory: Negative for shortness of breath.   Cardiovascular: Negative for chest pain and palpitations.  Gastrointestinal: Negative for abdominal pain, diarrhea and vomiting.  Musculoskeletal: Positive for neck pain. Negative for arthralgias and myalgias.  Skin: Negative for color change and rash.  Neurological: Positive for headaches. Negative for tremors and syncope.  Psychiatric/Behavioral: Negative for confusion and dysphoric mood.     Physical Exam Updated Vital Signs BP 126/65   Resp 20   Ht 5' 3"  (1.6 m)   Wt 49 kg (108 lb)   BMI 19.13 kg/m   Physical Exam  Constitutional: He is oriented to person, place, and time.  Cachectic  HENT:  Head: Normocephalic and atraumatic.  5.2cm lac to the  forehead.  Small superficial lac to the lower lip  Eyes: EOM are normal. Pupils are equal, round, and reactive to light.  Neck: Normal range of motion. Neck supple. No JVD present.  Cardiovascular: Normal rate and regular rhythm. Exam reveals no gallop and no friction rub.  No murmur heard. Pulmonary/Chest: No respiratory distress. He has no wheezes.  Abdominal: He exhibits no distension and no mass. There is no tenderness. There is no rebound and no guarding.  Musculoskeletal: Normal range of motion.  Neurological: He is alert and oriented to person, place, and time.  Skin: No rash noted. No pallor.  Psychiatric: He has a normal mood and affect. His behavior is normal.  Nursing note and vitals reviewed.    ED Treatments / Results  Labs (all labs ordered are listed, but only abnormal results are displayed) Labs Reviewed  I-STAT CHEM 8, ED - Abnormal; Notable for the following components:      Result Value   Glucose, Bld 110 (*)    Calcium, Ion 1.13 (*)    Hemoglobin 6.8 (*)    HCT 20.0 (*)    All other components within normal limits  POC OCCULT BLOOD, ED - Abnormal; Notable for the following components:   Fecal Occult Bld POSITIVE (*)    All other components within normal limits  CBC WITH DIFFERENTIAL/PLATELET  BASIC METABOLIC PANEL    EKG  EKG Interpretation  Date/Time:  Sunday June 22 2017 05:54:39 EDT Ventricular Rate:  80 PR Interval:    QRS Duration: 85 QT Interval:  377 QTC Calculation: 435 R Axis:   69 Text Interpretation:  Sinus rhythm Atrial premature complex Short PR interval Consider right atrial enlargement Left ventricular hypertrophy Nonspecific T abnormalities, lateral leads Baseline wander in lead(s) V3 V4 No significant change since last tracing Confirmed by Deno Etienne [phone]) on 06/22/2017 6:17:37 AM       Radiology Dg Pelvis 1-2 Views  Result Date: 06/22/2017 CLINICAL DATA:  Status post fall, with concern for pelvic injury. Initial encounter.  EXAM: PELVIS - 1-2 VIEW COMPARISON:  CT of the chest, abdomen and pelvis from 04/18/2014 FINDINGS: There is no evidence of fracture or dislocation. Both femoral heads are seated normally within their respective acetabula. No significant degenerative change is appreciated. The sacroiliac joints are unremarkable in appearance. The visualized bowel gas pattern is grossly unremarkable in appearance. IMPRESSION: No evidence  of fracture or dislocation. Electronically Signed   By: Garald Balding M.D.   On: 06/22/2017 06:37   Ct Head Wo Contrast  Result Date: 06/22/2017 CLINICAL DATA:  77 y/o M; fall. History of dementia on blood thinners. EXAM: CT HEAD WITHOUT CONTRAST CT CERVICAL SPINE WITHOUT CONTRAST TECHNIQUE: Multidetector CT imaging of the head and cervical spine was performed following the standard protocol without intravenous contrast. Multiplanar CT image reconstructions of the cervical spine were also generated. COMPARISON:  04/24/2017 CT head and cervical spine. FINDINGS: CT HEAD FINDINGS Brain: No evidence of acute infarction, hemorrhage, hydrocephalus, extra-axial collection or mass lesion/mass effect. 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 Stable chronic microvascular ischemic changes and parenchymal volume loss of the brain. Stable small chronic lacunar infarcts within bilateral basal ganglia. Vascular: Calcific atherosclerosis of carotid siphons. Skull: Frontal scalp laceration.  No calvarial fracture. Sinuses/Orbits: No acute finding. Other: None. CT CERVICAL SPINE FINDINGS Alignment: Normal. Skull base and vertebrae: C5 vertebral body anterior superior endplate acute minimally displaced limbus fracture and fracture of right transverse process (series 10, image 29 and series 11, image 13). No other fracture or dislocation identified. Soft tissues and spinal canal: No prevertebral fluid or swelling. No visible canal hematoma.  Disc levels: Moderate cervical spondylosis with multilevel disc and facet degenerative changes. No high-grade bony canal stenosis. Uncovertebral and facet hypertrophy encroaches on the bony neural foramina on the left side at C3-C7 and on the right side at C3-4. Upper chest: Negative. Other: Negative. IMPRESSION: CT head: 1. Frontal scalp laceration.  No calvarial fracture. 2. No acute intracranial abnormality. 3. Stable chronic infarctions, microvascular ischemic changes, and parenchymal volume loss of the brain. CT cervical spine: C5 vertebral body anterior superior endplate acute minimally displaced fracture and fracture of right transverse process. No malalignment. No other fracture identified. These results were called by telephone at the time of interpretation on 06/22/2017 at 6:46 am to Dr. Deno Etienne , who verbally acknowledged these results. Electronically Signed   By: Kristine Garbe M.D.   On: 06/22/2017 06:48   Ct Cervical Spine Wo Contrast  Result Date: 06/22/2017 CLINICAL DATA:  77 y/o M; fall. History of dementia on blood thinners. EXAM: CT HEAD WITHOUT CONTRAST CT CERVICAL SPINE WITHOUT CONTRAST TECHNIQUE: Multidetector CT imaging of the head and cervical spine was performed following the standard protocol without intravenous contrast. Multiplanar CT image reconstructions of the cervical spine were also generated. COMPARISON:  04/24/2017 CT head and cervical spine. FINDINGS: CT HEAD FINDINGS Brain: No evidence of acute infarction, hemorrhage, hydrocephalus, extra-axial collection or mass lesion/mass effect. 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 Stable chronic microvascular ischemic changes and parenchymal volume loss of the brain. Stable small chronic lacunar infarcts within bilateral basal ganglia. Vascular: Calcific atherosclerosis of carotid siphons. Skull: Frontal scalp laceration.  No calvarial fracture.  Sinuses/Orbits: No acute finding. Other: None. CT CERVICAL SPINE FINDINGS Alignment: Normal. Skull base and vertebrae: C5 vertebral body anterior superior endplate acute minimally displaced limbus fracture and fracture of right transverse process (series 10, image 29 and series 11, image 13). No other fracture or dislocation identified. Soft tissues and spinal canal: No prevertebral fluid or swelling. No visible canal hematoma. Disc levels: Moderate cervical spondylosis with multilevel disc and facet degenerative changes. No high-grade bony canal stenosis. Uncovertebral and facet hypertrophy encroaches on the bony neural foramina on the left side at C3-C7 and on the right side at  C3-4. Upper chest: Negative. Other: Negative. IMPRESSION: CT head: 1. Frontal scalp laceration.  No calvarial fracture. 2. No acute intracranial abnormality. 3. Stable chronic infarctions, microvascular ischemic changes, and parenchymal volume loss of the brain. CT cervical spine: C5 vertebral body anterior superior endplate acute minimally displaced fracture and fracture of right transverse process. No malalignment. No other fracture identified. These results were called by telephone at the time of interpretation on 06/22/2017 at 6:46 am to Dr. Deno Etienne , who verbally acknowledged these results. Electronically Signed   By: Kristine Garbe M.D.   On: 06/22/2017 06:48    Procedures .Marland KitchenLaceration Repair Date/Time: 06/22/2017 6:43 AM Performed by: Deno Etienne, DO Authorized by: Deno Etienne, DO   Consent:    Consent obtained:  Verbal   Consent given by:  Patient   Risks discussed:  Infection, pain, poor cosmetic result and poor wound healing   Alternatives discussed:  Delayed treatment and no treatment Anesthesia (see MAR for exact dosages):    Anesthesia method:  Local infiltration   Local anesthetic:  Lidocaine 1% WITH epi Laceration details:    Location:  Scalp   Scalp location:  Frontal   Length (cm):  5.2 Repair  type:    Repair type:  Intermediate Pre-procedure details:    Preparation:  Patient was prepped and draped in usual sterile fashion Exploration:    Hemostasis achieved with:  Direct pressure and epinephrine   Wound exploration: entire depth of wound probed and visualized     Contaminated: no   Treatment:    Wound cleansed with: Chlorhexidine.   Amount of cleaning:  Standard   Irrigation solution:  Sterile saline   Irrigation volume:  50   Irrigation method:  Pressure wash   Visualized foreign bodies/material removed: no   Skin repair:    Repair method:  Sutures   Suture size:  4-0   Suture material:  Nylon   Suture technique:  Simple interrupted   Number of sutures:  5 Approximation:    Approximation:  Close Post-procedure details:    Dressing:  Open (no dressing)   Patient tolerance of procedure:  Tolerated well, no immediate complications     (including critical care time)  Medications Ordered in ED Medications  lidocaine-EPINEPHrine (XYLOCAINE W/EPI) 2 %-1:200000 (PF) injection 20 mL (not administered)     Initial Impression / Assessment and Plan / ED Course  I have reviewed the triage vital signs and the nursing notes.  Pertinent labs & imaging results that were available during my care of the patient were reviewed by me and considered in my medical decision making (see chart for details).     77 yo M with a chief complaint of a fall from standing.  This happened at the nursing home this morning.  The patient does not remember the events.  He has a laceration to his forehead complaining of neck pain.  Will obtain a CT of the head and the neck.  He had some transient complaints of pelvis pain to EMS.  No signs of trauma no pain on exam.  Will obtain a plain film.  CT scan with a C5 vertebral body anterior superior endplate fracture.  I discussed this with Dr. Ronnald Ramp, he recommended placing the patient in a soft collar and following up with him in the office in 2-3  weeks.  Hgb low on istat.  Awaiting formal to recheck.  Rectal with soft brown stool, heme +.  Turned over to Dr. Rogene Houston, please see their  note for further details.   The patients results and plan were reviewed and discussed.   Any x-rays performed were independently reviewed by myself.   Differential diagnosis were considered with the presenting HPI.  Medications  lidocaine-EPINEPHrine (XYLOCAINE W/EPI) 2 %-1:200000 (PF) injection 20 mL (not administered)    Vitals:   06/22/17 0556 06/22/17 0600 06/22/17 0630 06/22/17 0700  BP:  119/74 122/67 126/65  Resp:  (!) 27 15 20   Weight: 49 kg (108 lb)     Height: 5' 3"  (1.6 m)       Final diagnoses:  Fall, initial encounter  Facial laceration, initial encounter  Closed nondisplaced fracture of fifth cervical vertebra, unspecified fracture morphology, initial encounter [facility])        Final Clinical Impressions(s) / ED Diagnoses   Final diagnoses:  Fall, initial encounter  Facial laceration, initial encounter  Closed nondisplaced fracture of fifth cervical vertebra, unspecified fracture morphology, initial encounter [facility])    ED Discharge Orders    None       Deno Etienne, DO 06/22/17 0745

## 2017-06-22 NOTE — Progress Notes (Signed)
Orthopedic Tech Progress Note Patient Details:  Grant Lara 04-04-1941 [id number]  Ortho Devices Type of Ortho Device: Soft collar       [name] 06/22/2017, 6:16 PM

## 2017-06-22 NOTE — ED Triage Notes (Signed)
Pt from [facility] via [facility]. Staff sts heard pt fall, face planted. Lac to forehead, bottom lip and c/o pain to neck, back & R side of body. Hx of dementia & on blood thinners. EDP @ bedside.

## 2017-06-22 NOTE — ED Notes (Signed)
Delay in lab draw,  Pt not in room at this time. 

## 2017-06-22 NOTE — Plan of Care (Signed)
Progressing

## 2017-06-22 NOTE — ED Notes (Signed)
Report attempt x 1 

## 2017-06-22 NOTE — ED Notes (Signed)
Attempted IV X2

## 2017-06-22 NOTE — Evaluation (Signed)
Clinical/Bedside Swallow Evaluation Patient Details  Name: Grant Lara MRN: [id number] Date of Birth: June 16, 1940  Today's Date: 06/22/2017 Time: SLP Start Time (ACUTE ONLY): 45 SLP Stop Time (ACUTE ONLY): 1608 SLP Time Calculation (min) (ACUTE ONLY): 18 min  Past Medical History:  Past Medical History:  Diagnosis Date  . AICD (automatic cardioverter/defibrillator) present   . Arthritis    "used to have a touch in my legs"  . Bradycardia 06/01/2014  . CAD (coronary artery disease)    LHC 9/05 with Dr. Einar Gip:  dLM 20-30%, LAD 85%, oD1 20-30%.  PCI:  Taxus DES to LAD; Dx jailed and tx with POBA.  Last myoview 12/10: inf scar, no ischemia, EF 29%.  . CHF (congestive heart failure) ([place])   . Crohn's disease ([place])   . Dementia   . Diabetes mellitus    11/05/11 "borderline; don't take medications"  . Dilated cardiomyopathy ([place])    2/12: EF 30-35%, trivial AI, mild RAE.  EF 2014 40 -45%  . DVT (deep venous thrombosis) ([place])   . HCAP (healthcare-associated pneumonia) 09/29/2015  . HLD (hyperlipidemia)   . 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 Hyperlipidemia   . Hypertension   . Memory difficulties   . Nephrolithiasis   . PFO (patent foramen ovale)    Not mentioned on 2014 echo.  . Pneumonia ~ 2011   09-22-13 denies any recent SOB or breathing problems  . Pulmonary embolus ([facility])    chronic coumadin  . Stroke [facility]) 1993   "left arm can't hold steady; leg too"  . Swelling of both ankles     09-22-13 occ.feet, but denies pain.   Past Surgical History:  Past Surgical History:  Procedure Laterality Date  . APPENDECTOMY    . COLON SURGERY  1994; 1996   "for Crohn's disease"  . IMPLANTABLE CARDIOVERTER DEFIBRILLATOR IMPLANT N/A 06/02/2014   Procedure: IMPLANTABLE CARDIOVERTER DEFIBRILLATOR IMPLANT;  Surgeon: [name], MD;  Location: [facility] CATH LAB;  Service: Cardiovascular;  Laterality: N/A;   HPI:  Grant Lara is a 77 y.o. 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 CT scan of the head and neck was obtained and showed a C5 vertebral body anterior superior endplate fracture. CT scan of the head further revealed chronic infarcts. Pt had MBS during prior admission on 09/30/15 with findings of mild oropharyngeal dysphagia characterized by delayed swallow initiation with consistent flash penetration of liquids. Laryngeal closure is adequate and airway remained protected throughout assessment despite large consecutive swallows. Regular diet/thin liquids recommended.   Assessment / Plan / Recommendation Clinical Impression  Patient presents with oropharyngeal swallow which appears at bedside to be within functional limits with adequate airway protection. No overt signs of aspiration observed despite challenging with consecutive straw sips of thin liquids in excess of 3oz. Pt is alert, oriented to self, location, situation, (date with cues); he follows all basic commands. Self feeds with some assistance due to tremor, restricted movement with C collar. Oral preparation and clearance adequate for solids. Recommend regular diet with thin liquids, meds whole in puree per pt preference. He will need assistance for feeding. No further skilled ST needs identified. Will s/o.   SLP Visit Diagnosis: Dysphagia, unspecified (R13.10)    Aspiration Risk  Mild aspiration risk    Diet Recommendation Regular;Thin liquid   Liquid Administration via: Cup;Straw Medication Administration: Whole meds with puree(pt prefers in applesauce) Supervision: Patient able to self feed Compensations: Slow rate;Small sips/bites Postural Changes: Seated upright at 90 degrees    Other  Recommendations Oral Care  Recommendations: Oral care BID   Follow up Recommendations Skilled Nursing facility      Frequency and Duration            Prognosis Prognosis for Safe Diet Advancement: Good      Swallow Study   General Date of Onset: 06/22/17 HPI: Grant Lara is a 77 y.o. 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 CT scan of the head and neck was obtained and showed a C5 vertebral body anterior superior endplate fracture. CT scan of the head further revealed chronic infarcts. Pt had MBS during prior admission on 09/30/15 with findings of mild oropharyngeal dysphagia characterized by delayed swallow initiation with consistent flash penetration of liquids. Laryngeal closure is adequate and airway remained protected throughout assessment despite large consecutive swallows. Regular diet/thin liquids recommended. Type of Study: Bedside Swallow Evaluation Previous Swallow Assessment: see HPI Diet Prior to this Study: Regular;Thin liquids Temperature Spikes Noted: No Respiratory Status: Nasal cannula History of Recent Intubation: No Behavior/Cognition: Alert;Cooperative;Pleasant mood Oral Cavity Assessment: Within Functional Limits Oral Care Completed by SLP: No Oral Cavity - Dentition: Dentures, top;Other (Comment)(partial, bottom) Vision: Functional for self-feeding Self-Feeding Abilities: Needs set up;Needs assist(may need some assistance with C collar) Patient Positioning: Upright in bed Baseline Vocal Quality: Normal Volitional Cough: Strong Volitional Swallow: Able to elicit    Oral/Motor/Sensory Function Overall Oral Motor/Sensory Function: Within functional limits   Ice Chips Ice chips:  Within functional limits   Thin Liquid Thin Liquid: Within functional limits Presentation: Cup;Straw;Self Fed    Nectar Thick Nectar Thick Liquid: Not tested   Honey Thick Honey Thick Liquid: Not tested   Puree Puree: Within functional limits Presentation: Self Fed;Spoon   Solid   GO   Solid: Within functional limits Presentation: [place], [place], [place] Speech-Language Pathologist [place] 06/22/2017,4:15 PM

## 2017-06-22 NOTE — H&P (Signed)
History and Physical    Grant Lara WJX:[id number] DOB: 03/27/1941 DOA: 06/22/2017  PCP: Gildardo Cranker, DO  Patient coming from: [facility] facility  I have personally briefly reviewed patient's old medical records in [place]  Chief Complaint: Fall from standing while on blood thinners at skilled facility.  HPI: Grant Lara is a 77 y.o. 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  May have been associated with syncope as the patient had a complete face plant and ended up with a frontal laceration requiring stitches.  He is presently complaining of a headache and neck pain.  Currently denies chest pain, abdominal pain, back pain, extremity pain.  History is limited by the patient's dementia.  There is no family member present to assist with history all of the history is obtained from the patient, Dr. Rogene Houston and medical record.  States that the fall from standing happened at the nursing facility this morning he does not remember the events.  CT scan of the head and neck was obtained and showed a C5 vertebral body anterior superior endplate fracture.  This was discussed with Dr. Ronnald Ramp from neurosurgery who recommended placing the patient a soft collar and following up with him in the office in 2-3 weeks.  Blood work was obtained and patient found to have a hemoglobin of 6.1 very low MCV of 74..  Echo examination revealed soft brown heme positive stool.  Review of patient's record reveals that his hemoglobin usually runs about 8.1-8.3 (he has chronic kidney disease stage III) with a MCV in the low 70s. CT scan of the head further revealed chronic infarcts.  Patient himself  complains of a poor appetite and weight loss.  And this has been documented in his medical record from prior evaluations by his primary care physician Dr. Gildardo Cranker.  Review of Systems: As per HPI otherwise all other systems reviewed and negative.  Limited by patient's dementia  Past Medical History:  Diagnosis Date  . AICD (automatic cardioverter/defibrillator) present   . Arthritis    "used to have a touch in my legs"  . Bradycardia 06/01/2014  . CAD (coronary artery disease)    LHC 9/05 with Dr. Einar Gip:  dLM 20-30%, LAD 85%, oD1 20-30%.  PCI:  Taxus DES to LAD; Dx jailed and tx with POBA.  Last myoview 12/10: inf scar, no ischemia, EF 29%.  . CHF (congestive heart failure) ([place])   . Crohn's disease ([place])   . Dementia   . Diabetes mellitus    11/05/11 "borderline; don't take medications"  . Dilated cardiomyopathy ([place])    2/12: EF 30-35%, trivial AI, mild RAE.  EF 2014 40 -45%  . DVT (deep venous thrombosis) ([place])   . HCAP (healthcare-associated pneumonia) 09/29/2015  . HLD (hyperlipidemia)   . 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 Hyperlipidemia   . Hypertension   . Memory difficulties   . Nephrolithiasis   . PFO (patent foramen ovale)    Not mentioned on 2014 echo.  . Pneumonia ~ 2011   09-22-13 denies any recent SOB or breathing problems  . Pulmonary embolus (HCC)    chronic coumadin  . Stroke [facility]) 1993   "left arm can't hold steady; leg too"  .  Swelling of both ankles     09-22-13 occ.feet, but denies pain.    Past Surgical History:  Procedure Laterality Date  . APPENDECTOMY    . COLON SURGERY  1994; 1996   "for Crohn's disease"  . IMPLANTABLE CARDIOVERTER DEFIBRILLATOR IMPLANT N/A 06/02/2014   Procedure: IMPLANTABLE CARDIOVERTER DEFIBRILLATOR IMPLANT;  Surgeon: [name], MD;  Location: [facility] CATH LAB;  Service: Cardiovascular;  Laterality: N/A;     reports that he has been smoking cigarettes.  He has a 26.50 pack-year smoking history. he has never used smokeless  tobacco. He reports that he does not drink alcohol or use drugs.  Allergies  Allergen Reactions  . Tuberculin Tests Other (See Comments)    Per MAR (no description provided)    Family History  Problem Relation Age of Onset  . Diabetes type II Mother   . CAD Father   . Diabetes type II Brother     Prior to Admission medications   Medication Sig Start Date End Date Taking? Authorizing Provider  aspirin 81 MG tablet Take 81 mg by mouth daily.   Yes [provider]  donepezil (ARICEPT) 10 MG tablet Take 10 mg by mouth at bedtime.  02/23/15  Yes [provider]  gabapentin (NEURONTIN) 100 MG capsule Take 100 mg by mouth at bedtime.   Yes [provider]  ipratropium-albuterol (DUONEB) 0.5-2.5 (3) MG/3ML SOLN Take 3 mLs by nebulization every 6 (six) hours as needed (for breathing).    Yes [provider]  latanoprost (XALATAN) 0.005 % ophthalmic solution Place 1 drop into both eyes at bedtime. 06/29/13  Yes [provider]  levETIRAcetam (KEPPRA) 500 MG tablet Take 1 tablet (500 mg total) by mouth 2 (two) times daily. 03/28/16  Yes Hosie Poisson, MD  loperamide (IMODIUM A-D) 2 MG tablet Take 2 mg by mouth every 4 (four) hours as needed for diarrhea or loose stools. X 14 days 06/16/17 06/30/17 Yes [provider]  mirtazapine (REMERON) 7.5 MG tablet Take 7.5 mg by mouth at bedtime.   Yes [provider]  rivaroxaban (XARELTO) 20 MG TABS tablet Take 20 mg by mouth at bedtime.   Yes [provider]  senna (SENOKOT) 8.6 MG TABS tablet Take 1 tablet by mouth every morning.   Yes [provider]  sertraline (ZOLOFT) 50 MG tablet Take 75 mg by mouth daily. 04/23/17  Yes [provider]  tamsulosin (FLOMAX) 0.4 MG CAPS capsule Take 0.4 mg by mouth at bedtime.   Yes [provider]  traMADol (ULTRAM) 50 MG tablet Take 1 tablet (50 mg total) by mouth 3 (three) times daily. Hold for sedation 04/28/17  Yes Gildardo Cranker, DO    Physical Exam: Vitals:   06/22/17 1050 06/22/17 1100 06/22/17 1200 06/22/17 1230  BP: 114/65 124/61 110/69 131/70  Pulse: 72     Resp: 17 13 17 12   Temp: 98.9 F (37.2 C)     TempSrc: Oral     SpO2: 95%  93%   Weight:      Height:       .TCS Constitutional: NAD, calm, comfortable Vitals:   06/22/17 1050 06/22/17 1100 06/22/17 1200 06/22/17 1230  BP: 114/65 124/61 110/69 131/70  Pulse: 72     Resp: 17 13 17 12   Temp: 98.9 F (37.2 C)     TempSrc: Oral     SpO2: 95%  93%   Weight:      Height:  Eyes: PERRL, lids and conjunctivae pale ENMT: Mucous membranes are moist. Posterior pharynx clear of any exudate or lesions.Normal dentition.  Neck: Currently in c-collar. Respiratory: clear to auscultation bilaterally, no wheezing, no crackles. Normal respiratory effort. No accessory muscle use.  Cardiovascular: Regular rate and rhythm, no murmurs / rubs / gallops. No extremity edema. 2+ pedal pulses. No carotid bruits.  Abdomen: no tenderness, no masses palpated. No hepatosplenomegaly. Bowel sounds positive.  Musculoskeletal: no clubbing / cyanosis. No joint deformity upper and lower extremities. Good ROM, no contractures. Normal muscle tone.  Skin: no rashes,  ulcers. No induration, forehead laceration stitched.  Palms are pale Neurologic: CN 2-12 grossly intact. Sensation intact, DTR normal. Strength 5/5 in all 4.  Psychiatric: Normal judgment and insight. Alert and oriented x 3. Normal mood.     Labs on Admission: I have personally reviewed following labs and imaging studies  CBC: Recent Labs  Lab 06/22/17 0707 06/22/17 0825  WBC  --  10.1  NEUTROABS  --  8.0*  HGB 6.8* 6.1*  HCT 20.0* 21.4*  MCV  --  74.0*  PLT  --  166   Basic Metabolic Panel: Recent Labs  Lab 06/22/17 0707 06/22/17 0825  NA 140 139  K 4.7 4.8  CL 104 105  CO2  --  23  GLUCOSE 110* 104*  BUN 18 14  CREATININE 1.10 1.17  CALCIUM  --  8.8*   GFR: Estimated Creatinine  Clearance: 36.1 mL/min (by C-G formula based on SCr of 1.17 mg/dL). Liver Function Tests: No results for input(s): AST, ALT, ALKPHOS, BILITOT, PROT, ALBUMIN in the last 168 hours. No results for input(s): LIPASE, AMYLASE in the last 168 hours. No results for input(s): AMMONIA in the last 168 hours. Coagulation Profile: No results for input(s): INR, PROTIME in the last 168 hours. Cardiac Enzymes: No results for input(s): CKTOTAL, CKMB, CKMBINDEX, TROPONINI in the last 168 hours. BNP (last 3 results) No results for input(s): PROBNP in the last 8760 hours. HbA1C: No results for input(s): HGBA1C in the last 72 hours. CBG: No results for input(s): GLUCAP in the last 168 hours. Lipid Profile: No results for input(s): CHOL, HDL, LDLCALC, TRIG, CHOLHDL, LDLDIRECT in the last 72 hours. Thyroid Function Tests: No results for input(s): TSH, T4TOTAL, FREET4, T3FREE, THYROIDAB in the last 72 hours. Anemia Panel: No results for input(s): VITAMINB12, FOLATE, FERRITIN, TIBC, IRON, RETICCTPCT in the last 72 hours. Urine analysis:    Component Value Date/Time   COLORURINE YELLOW 04/24/2017 0424   APPEARANCEUR CLOUDY (A) 04/24/2017 0424   LABSPEC >1.030 (H) 04/24/2017 0424   PHURINE 5.0 04/24/2017 0424   GLUCOSEU NEGATIVE 04/24/2017 0424   HGBUR SMALL (A) 04/24/2017 0424   BILIRUBINUR SMALL (A) 04/24/2017 0424   KETONESUR 15 (A) 04/24/2017 0424   PROTEINUR NEGATIVE 04/24/2017 0424   UROBILINOGEN 1.0 12/21/2014 1520   NITRITE NEGATIVE 04/24/2017 0424   LEUKOCYTESUR TRACE (A) 04/24/2017 0424    Radiological Exams on Admission: Dg Pelvis 1-2 Views  Result Date: 06/22/2017 CLINICAL DATA:  Status post fall, with concern for pelvic injury. Initial encounter. EXAM: PELVIS - 1-2 VIEW COMPARISON:  CT of the chest, abdomen and pelvis from 04/18/2014 FINDINGS: There is no evidence of fracture or dislocation. Both femoral heads are seated normally within their respective acetabula. No significant  degenerative change is appreciated. The sacroiliac joints are unremarkable in appearance. The visualized bowel gas pattern is grossly unremarkable in appearance. IMPRESSION: No evidence of fracture or dislocation. Electronically Signed   By:  Garald Balding M.D.   On: 06/22/2017 06:37   Ct Head Wo Contrast  Result Date: 06/22/2017 CLINICAL DATA:  77 y/o M; fall. History of dementia on blood thinners. EXAM: CT HEAD WITHOUT CONTRAST CT CERVICAL SPINE WITHOUT CONTRAST TECHNIQUE: Multidetector CT imaging of the head and cervical spine was performed following the standard protocol without intravenous contrast. Multiplanar CT image reconstructions of the cervical spine were also generated. COMPARISON:  04/24/2017 CT head and cervical spine. FINDINGS: CT HEAD FINDINGS Brain: No evidence of acute infarction, hemorrhage, hydrocephalus, extra-axial collection or mass lesion/mass effect. 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 Stable chronic microvascular ischemic changes and parenchymal volume loss of the brain. Stable small chronic lacunar infarcts within bilateral basal ganglia. Vascular: Calcific atherosclerosis of carotid siphons. Skull: Frontal scalp laceration.  No calvarial fracture. Sinuses/Orbits: No acute finding. Other: None. CT CERVICAL SPINE FINDINGS Alignment: Normal. Skull base and vertebrae: C5 vertebral body anterior superior endplate acute minimally displaced limbus fracture and fracture of right transverse process (series 10, image 29 and series 11, image 13). No other fracture or dislocation identified. Soft tissues and spinal canal: No prevertebral fluid or swelling. No visible canal hematoma. Disc levels: Moderate cervical spondylosis with multilevel disc and facet degenerative changes. No high-grade bony canal stenosis. Uncovertebral and facet hypertrophy encroaches on the bony neural foramina on the left side at C3-C7 and  on the right side at C3-4. Upper chest: Negative. Other: Negative. IMPRESSION: CT head: 1. Frontal scalp laceration.  No calvarial fracture. 2. No acute intracranial abnormality. 3. Stable chronic infarctions, microvascular ischemic changes, and parenchymal volume loss of the brain. CT cervical spine: C5 vertebral body anterior superior endplate acute minimally displaced fracture and fracture of right transverse process. No malalignment. No other fracture identified. These results were called by telephone at the time of interpretation on 06/22/2017 at 6:46 am to Dr. Deno Etienne , who verbally acknowledged these results. Electronically Signed   By: Kristine Garbe M.D.   On: 06/22/2017 06:48   Ct Cervical Spine Wo Contrast  Result Date: 06/22/2017 CLINICAL DATA:  77 y/o M; fall. History of dementia on blood thinners. EXAM: CT HEAD WITHOUT CONTRAST CT CERVICAL SPINE WITHOUT CONTRAST TECHNIQUE: Multidetector CT imaging of the head and cervical spine was performed following the standard protocol without intravenous contrast. Multiplanar CT image reconstructions of the cervical spine were also generated. COMPARISON:  04/24/2017 CT head and cervical spine. FINDINGS: CT HEAD FINDINGS Brain: No evidence of acute infarction, hemorrhage, hydrocephalus, extra-axial collection or mass lesion/mass effect. 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 Stable chronic microvascular ischemic changes and parenchymal volume loss of the brain. Stable small chronic lacunar infarcts within bilateral basal ganglia. Vascular: Calcific atherosclerosis of carotid siphons. Skull: Frontal scalp laceration.  No calvarial fracture. Sinuses/Orbits: No acute finding. Other: None. CT CERVICAL SPINE FINDINGS Alignment: Normal. Skull base and vertebrae: C5 vertebral body anterior superior endplate acute minimally displaced limbus fracture and fracture of right transverse  process (series 10, image 29 and series 11, image 13). No other fracture or dislocation identified. Soft tissues and spinal canal: No prevertebral fluid or swelling. No visible canal hematoma. Disc levels: Moderate cervical spondylosis with multilevel disc and facet degenerative changes. No high-grade bony canal stenosis. Uncovertebral and facet hypertrophy encroaches on the bony neural foramina on the left side at C3-C7 and on the right side at C3-4. Upper chest: Negative. Other: Negative. IMPRESSION: CT head:  1. Frontal scalp laceration.  No calvarial fracture. 2. No acute intracranial abnormality. 3. Stable chronic infarctions, microvascular ischemic changes, and parenchymal volume loss of the brain. CT cervical spine: C5 vertebral body anterior superior endplate acute minimally displaced fracture and fracture of right transverse process. No malalignment. No other fracture identified. These results were called by telephone at the time of interpretation on 06/22/2017 at 6:46 am to Dr. Deno Etienne , who verbally acknowledged these results. Electronically Signed   By: Kristine Garbe M.D.   On: 06/22/2017 06:48    EKG: Independently reviewed.  Sinus rhythm with PACs, right atrial enlargement, left ventricular hypertrophy and nonspecific ST-T wave abnormalities which appear old.  Assessment/Plan Principal Problem:   Acute on chronic blood loss anemia (HCC) Active Problems:   Anemia, chronic disease   Hypertensive heart disease with CHF (congestive heart failure) (HCC)   COPD (chronic obstructive pulmonary disease) (HCC)   CKD (chronic kidney disease) stage 3, GFR 30-59 ml/min (HCC)   Protein-calorie malnutrition, severe (HCC)   DM type 2 causing CKD stage 3 (HCC)   Weight loss, non-intentional   DVT (deep venous thrombosis) (HCC)   Coronary artery disease involving native coronary artery of native heart without angina pectoris   Pulmonary embolus ([place])   Late effects of CVA (cerebrovascular  accident)   Vascular dementia without behavioral disturbance   ICD (implantable cardioverter-defibrillator) in place  1.  Acute on chronic blood loss anemia: Patient will require transfusion of 2 units of packed red blood cells.  Given his cardiomyopathy he will get 40 mg of IV Lasix preceding the first unit.  Will monitor serial H&H's every 6 hours x6 more.  If hemoglobin is less than 2:08 units will require transfusion of more blood.  I have asked Dr. Rogene Houston to contact GI regarding possible GI evaluation for weight loss and heme positive stools with chronic anemia.  2.  Chronic anemia: Patient has a long-standing history of low hemoglobins with a very low MCV.  In the past his stool heme test have been negative (in 2016 and 2017) today however he does have blood in his stool.  His anemia is further complicated by his chronic kidney disease were likely low erythropoietin levels contribute to the anemia.  Will require GI evaluation.  3.  Hypertensive heart disease with congestive heart failure: Currently compensated no evidence of decompensated congestive heart failure will receive 40 of Lasix IV prior to first unit of blood.  4.  COPD: Continue home medication regimen which includes nebulizers.  Patient is not oxygen dependent.  5.  Hypertensive heart disease with congestive heart failure: Blood pressures are currently well controlled we will continue home medication regimen.  6.  COPD: Continue nebulizers and as needed oxygen.  Patient does not appear to require steroids at this point he is well compensated.  7.  Chronic kidney disease stage III: Avoid nephrotoxic medications.  Patient's creatinine is fairly stable at 1.1.  8.  Protein calorie malnutrition severe: We will consult nutrition for assistance.  Patient will likely require snacks.  9.  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  Please  note that at home patient is on no medications and apparently is diet controlled.  10.  Weight loss: Patient has had nonintentional weight loss which is been documented by his primary care physician as well as today.  Concern is of course for underlying GI malignancy.  Await results  of colonoscopy and consult nutrition.  11.  History of pulmonary embolism and deep venous thrombosis: Patient anticoagulated.  Will need to resume prior discharge.  His SCDs for now.  12.  Coronary artery disease involving native coronary artery of native heart without angina: Continue home medications.  Patient unable to take aspirin now due to GI bleeding.  13.  Late effects of stroke: Patient with some residual weakness and dementia continue home medications.  14.  Vascular dementia without behavioral disturbance: Continue aspirin when able.  Patient needs frequent redirection.  15.  AICD in place: Noted  ### 16.  C5 vertebra anterior and superior endplate fracture of right transverse process: Dr. Ronnald Ramp spoke with her Rogene Houston in the emergency department.  They will follow-up patient in 2 weeks in the office.  Please make appointment at discharge.  DVT prophylaxis: SCDs Code Status: DNR Family Communication: No family present. Disposition Plan: Back to skilled facility when hemoglobin stable Consults called: GI and Dr. Ronnald Ramp from neurosurgery (neurosurgery states they do not need to see patient in hospital he can follow-up with them in 2 weeks. Admission status: Inpatient   Lady Deutscher MD FACP [facility] Pager [phone]  If 7PM-7AM, please contact night-coverage [email or web address] Password [email or web address]  06/22/2017, 1:01 PM

## 2017-06-23 DIAGNOSIS — S12401D Unspecified nondisplaced fracture of fifth cervical vertebra, subsequent encounter for fracture with routine healing: Secondary | ICD-10-CM

## 2017-06-23 DIAGNOSIS — E1122 Type 2 diabetes mellitus with diabetic chronic kidney disease: Secondary | ICD-10-CM

## 2017-06-23 DIAGNOSIS — L8990 Pressure ulcer of unspecified site, unspecified stage: Secondary | ICD-10-CM | POA: Diagnosis present

## 2017-06-23 LAB — GLUCOSE, CAPILLARY
GLUCOSE-CAPILLARY: 78 mg/dL (ref 65–99)
Glucose-Capillary: 121 mg/dL — ABNORMAL HIGH (ref 65–99)
Glucose-Capillary: 146 mg/dL — ABNORMAL HIGH (ref 65–99)

## 2017-06-23 LAB — BASIC METABOLIC PANEL
ANION GAP: 10 (ref 5–15)
BUN: 14 mg/dL (ref 6–20)
CHLORIDE: 102 mmol/L (ref 101–111)
CO2: 24 mmol/L (ref 22–32)
Calcium: 8.4 mg/dL — ABNORMAL LOW (ref 8.9–10.3)
Creatinine, Ser: 1.08 mg/dL (ref 0.61–1.24)
GFR calc Af Amer: >60 mL/min (ref >60)
GFR calc non Af Amer: >60 mL/min (ref >60)
Glucose, Bld: 87 mg/dL (ref 65–99)
POTASSIUM: 4.1 mmol/L (ref 3.5–5.1)
SODIUM: 136 mmol/L (ref 135–145)

## 2017-06-23 LAB — HEMOGLOBIN AND HEMATOCRIT, BLOOD
HCT: 26.4 % — ABNORMAL LOW (ref 39.0–52.0)
HEMATOCRIT: 25.5 % — AB (ref 39.0–52.0)
HEMOGLOBIN: 8 g/dL — AB (ref 13.0–17.0)
HEMOGLOBIN: 7.6 g/dL — AB (ref 13.0–17.0)

## 2017-06-23 MED ORDER — FUROSEMIDE 10 MG/ML IJ SOLN
20.0000 mg | Freq: Once | INTRAMUSCULAR | Status: AC
  Administered 2017-06-24: 20 mg via INTRAVENOUS
  Filled 2017-06-23: qty 2

## 2017-06-23 MED ORDER — FUROSEMIDE 10 MG/ML IJ SOLN
20.0000 mg | Freq: Once | INTRAMUSCULAR | Status: AC
  Administered 2017-06-23: 20 mg via INTRAVENOUS
  Filled 2017-06-23: qty 2

## 2017-06-23 MED ORDER — INSULIN ASPART 100 UNIT/ML ~~LOC~~ SOLN
0.0000 [IU] | Freq: Three times a day (TID) | SUBCUTANEOUS | Status: DC
  Administered 2017-06-23 – 2017-06-26 (×3): 1 [IU] via SUBCUTANEOUS

## 2017-06-23 MED ORDER — SODIUM CHLORIDE 0.9 % IV SOLN
Freq: Once | INTRAVENOUS | Status: AC
  Administered 2017-06-23: 250 mL via INTRAVENOUS

## 2017-06-23 MED ORDER — WHITE PETROLATUM EX OINT
TOPICAL_OINTMENT | CUTANEOUS | Status: AC
  Administered 2017-06-23: 1
  Filled 2017-06-23: qty 28.35

## 2017-06-23 NOTE — Consult Note (Signed)
Referring Provider:  Dr. [name] Primary Care Physician:  Gildardo Cranker, DO Primary Gastroenterologist:  Dr. Cristina Gong  Reason for Consultation:  GI bleed  HPI: Grant Lara is a 77 y.o. male a past medical history of pulmonary embolism currently on Xarelto, history of cardiomyopathy, history of COPD, history of chronic kidney disease admitted to the hospital with fall. He was found to hemoglobin of 6.1. GI is consulted for further evaluation. Patient was also found to have T5 vertebral body and platelet fracture. Currently has a soft collar per neurosurgery recommendation.  Asian seen and examined at bedside. She denied any black stool or bright blood per rectum. He denies any abdominal pain, nausea or vomiting. Denied any GI symptoms today.  Patient with history of Crohn's disease status post partial colon  resection in 1990.   Past Medical History:  Diagnosis Date  . AICD (automatic cardioverter/defibrillator) present   . Arthritis    "used to have a touch in my legs"  . Bradycardia 06/01/2014  . CAD (coronary artery disease)    LHC 9/05 with Dr. Einar Gip:  dLM 20-30%, LAD 85%, oD1 20-30%.  PCI:  Taxus DES to LAD; Dx jailed and tx with POBA.  Last myoview 12/10: inf scar, no ischemia, EF 29%.  . CHF (congestive heart failure) ([place])   . Crohn's disease ([place])   . Dementia   . Diabetes mellitus    11/05/11 "borderline; don't take medications"  . Dilated cardiomyopathy ([place])    2/12: EF 30-35%, trivial AI, mild RAE.  EF 2014 40 -45%  . DVT (deep venous thrombosis) ([place])   . HCAP (healthcare-associated pneumonia) 09/29/2015  . HLD (hyperlipidemia)   . 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 Hyperlipidemia   . Hypertension   . Memory difficulties   . Nephrolithiasis   . PFO (patent foramen ovale)    Not mentioned on 2014 echo.  . Pneumonia ~ 2011   09-22-13 denies any recent SOB or breathing problems  . Pulmonary embolus ([facility])    chronic coumadin  . Stroke [facility]) 1993   "left arm can't hold  steady; leg too"  . Swelling of both ankles     09-22-13 occ.feet, but denies pain.    Past Surgical History:  Procedure Laterality Date  . APPENDECTOMY    . COLON SURGERY  1994; 1996   "for Crohn's disease"  . IMPLANTABLE CARDIOVERTER DEFIBRILLATOR IMPLANT N/A 06/02/2014   Procedure: IMPLANTABLE CARDIOVERTER DEFIBRILLATOR IMPLANT;  Surgeon: [name], MD;  Location: [facility] CATH LAB;  Service: Cardiovascular;  Laterality: N/A;    Prior to Admission medications   Medication Sig Start Date End Date Taking? Authorizing Provider  aspirin 81 MG tablet Take 81 mg by mouth daily.   Yes [provider]  donepezil (ARICEPT) 10 MG tablet Take 10 mg by mouth at bedtime.  02/23/15  Yes [provider]  gabapentin (NEURONTIN) 100 MG capsule Take 100 mg by mouth at bedtime.   Yes [provider]  ipratropium-albuterol (DUONEB) 0.5-2.5 (3) MG/3ML SOLN Take 3 mLs by nebulization every 6 (six) hours as needed (for breathing).    Yes [provider]  latanoprost (XALATAN) 0.005 % ophthalmic solution Place 1 drop into both eyes at bedtime. 06/29/13  Yes [provider]  levETIRAcetam (KEPPRA) 500 MG tablet Take 1 tablet (500 mg total) by mouth 2 (two) times daily. 03/28/16  Yes Hosie Poisson, MD  loperamide (IMODIUM A-D) 2 MG tablet Take 2 mg by mouth every 4 (four) hours as needed for  diarrhea or loose stools. X 14 days 06/16/17 06/30/17 Yes [provider]  mirtazapine (REMERON) 7.5 MG tablet Take 7.5 mg by mouth at bedtime.   Yes [provider]  rivaroxaban (XARELTO) 20 MG TABS tablet Take 20 mg by mouth at bedtime.   Yes [provider]  senna (SENOKOT) 8.6 MG TABS tablet Take 1 tablet by mouth every morning.   Yes [provider]  sertraline (ZOLOFT) 50 MG tablet Take 75 mg by mouth daily. 04/23/17  Yes [provider]  tamsulosin (FLOMAX) 0.4 MG CAPS capsule Take 0.4 mg by mouth at bedtime.   Yes [provider]  traMADol (ULTRAM) 50 MG tablet Take 1 tablet (50 mg total) by mouth 3 (three) times daily. Hold for sedation 04/28/17  Yes Eulas Post, [place], DO    Scheduled Meds: . donepezil  10 mg Oral QHS  . furosemide  20 mg Intravenous Once  . furosemide  20 mg Intravenous Once  . gabapentin  100 mg Oral QHS  . insulin aspart  0-9 Units Subcutaneous TID WC  . latanoprost  1 drop Both Eyes QHS  . levETIRAcetam  500 mg Oral BID  . mirtazapine  7.5 mg Oral QHS  . sertraline  75 mg Oral Daily  . sodium chloride flush  3 mL Intravenous Q12H  . tamsulosin  0.4 mg Oral QHS  . traMADol  50 mg Oral TID   Continuous Infusions: . sodium chloride 1,000 mL (06/23/17 1439)   PRN Meds:.acetaminophen **OR** acetaminophen, ipratropium-albuterol, ondansetron **OR** ondansetron (ZOFRAN) IV, oxyCODONE  Allergies as of 06/22/2017 - Review Complete 06/22/2017  Allergen Reaction Noted  . Tuberculin tests Other (See Comments) 03/26/2016    Family History  Problem Relation Age of Onset  . Diabetes type II Mother   . CAD Father   . Diabetes type II Brother     Social History   Socioeconomic History  . Marital status: Married    Spouse name: Not on file  . Number of children: Not on file  . Years of education: Not on file  . Highest education level: Not on file  Social Needs  . Financial resource strain: Not on file  . Food insecurity - worry: Not on file  . Food insecurity - inability: Not on file  . Transportation needs - medical: Not on file  . Transportation needs - non-medical: Not on file  Occupational History  . Not on file  Tobacco Use  . Smoking status: Current Some Day Smoker    Packs/day: 0.50    Years: 53.00    Pack years: 26.50    Types: Cigarettes  . Smokeless tobacco: Never Used  Substance and Sexual Activity  . Alcohol use: No    Alcohol/week: 0.0 oz  . Drug use: No  . Sexual activity: Not Currently  Other Topics Concern  . Not on file  Social History Narrative  . Not on  file    Review of Systems: Review of Systems  Constitutional: Positive for weight loss. Negative for chills and fever.  HENT: Negative for hearing loss and tinnitus.   Eyes: Negative for blurred vision and double vision.  Respiratory: Positive for shortness of breath. Negative for cough and hemoptysis.   Cardiovascular: Negative for chest pain and palpitations.  Gastrointestinal: Negative for abdominal pain, blood in stool, constipation, diarrhea, heartburn, melena, nausea and vomiting.  Musculoskeletal: Positive for falls and neck pain.  Skin: Negative for itching and rash.  Neurological: Positive for weakness. Negative for  seizures and loss of consciousness.  Endo/Heme/Allergies: Does not bruise/bleed easily.  Psychiatric/Behavioral: Negative for hallucinations and suicidal ideas.    Physical Exam: Vital signs: Vitals:   06/23/17 1522 06/23/17 1549  BP: 103/61 110/61  Pulse: 76 74  Resp: 18 20  Temp: 98.5 F (36.9 C) 98.7 F (37.1 C)  SpO2: 100% 99%   Last BM Date: (PTA) Physical Exam  Constitutional: He is oriented to person, place, and time. He appears well-developed and well-nourished. No distress.  Cervical collar in place  HENT:  Mouth/Throat: Oropharynx is clear and moist. No oropharyngeal exudate.  Eyes: EOM are normal. No scleral icterus.  Neck:  Soft collar in place  Cardiovascular: Normal rate, regular rhythm and normal heart sounds.  Pulmonary/Chest: Effort normal and breath sounds normal. No respiratory distress.  Abdominal: Soft. Bowel sounds are normal. He exhibits no distension. There is no tenderness. There is no rebound and no guarding.  Scar marks from previous surgery noted  Musculoskeletal: Normal range of motion. He exhibits no edema.  Neurological: He is alert and oriented to person, place, and time.  Skin: Skin is warm. No erythema.  Psychiatric: He has a normal mood and affect. Judgment and thought content normal.  Vitals reviewed.   GI:   Lab Results: Recent Labs    06/22/17 0825 06/22/17 1430 06/23/17 0621 06/23/17 1229  WBC 10.1  --   --   --   HGB 6.1* 7.3* 8.0* 7.6*  HCT 21.4* 25.2* 26.4* 25.5*  PLT 381  --   --   --    BMET Recent Labs    06/22/17 0707 06/22/17 0825 06/23/17 0621  NA 140 139 136  K 4.7 4.8 4.1  CL 104 105 102  CO2  --  23 24  GLUCOSE 110* 104* 87  BUN 18 14 14   CREATININE 1.10 1.17 1.08  CALCIUM  --  8.8* 8.4*   LFT No results for input(s): PROT, ALBUMIN, AST, ALT, ALKPHOS, BILITOT, BILIDIR, IBILI in the last 72 hours. PT/INR No results for input(s): LABPROT, INR in the last 72 hours.   Studies/Results: Dg Pelvis 1-2 Views  Result Date: 06/22/2017 CLINICAL DATA:  Status post fall, with concern for pelvic injury. Initial encounter. EXAM: PELVIS - 1-2 VIEW COMPARISON:  CT of the chest, abdomen and pelvis from 04/18/2014 FINDINGS: There is no evidence of fracture or dislocation. Both femoral heads are seated normally within their respective acetabula. No significant degenerative change is appreciated. The sacroiliac joints are unremarkable in appearance. The visualized bowel gas pattern is grossly unremarkable in appearance. IMPRESSION: No evidence of fracture or dislocation. Electronically Signed   By: Garald Balding M.D.   On: 06/22/2017 06:37   Ct Head Wo Contrast  Result Date: 06/22/2017 CLINICAL DATA:  77 y/o M; fall. History of dementia on blood thinners. EXAM: CT HEAD WITHOUT CONTRAST CT CERVICAL SPINE WITHOUT CONTRAST TECHNIQUE: Multidetector CT imaging of the head and cervical spine was performed following the standard protocol without intravenous contrast. Multiplanar CT image reconstructions of the cervical spine were also generated. COMPARISON:  04/24/2017 CT head and cervical spine. FINDINGS: CT HEAD FINDINGS Brain: No evidence of acute infarction, hemorrhage, hydrocephalus, extra-axial collection or mass lesion/mass effect. Multiple chronic infarcts in bilateral cerebellar  hemispheres, bilateral occipital lobes and frontal parietal lobes. Stable chronic microvascular ischemic changes and parenchymal volume loss of the brain. Stable small chronic lacunar infarcts within bilateral basal ganglia. Vascular: Calcific atherosclerosis of carotid siphons. Skull: Frontal scalp laceration.  No calvarial fracture.  Sinuses/Orbits: No acute finding. Other: None. CT CERVICAL SPINE FINDINGS Alignment: Normal. Skull base and vertebrae: C5 vertebral body anterior superior endplate acute minimally displaced limbus fracture and fracture of right transverse process (series 10, image 29 and series 11, image 13). No other fracture or dislocation identified. Soft tissues and spinal canal: No prevertebral fluid or swelling. No visible canal hematoma. Disc levels: Moderate cervical spondylosis with multilevel disc and facet degenerative changes. No high-grade bony canal stenosis. Uncovertebral and facet hypertrophy encroaches on the bony neural foramina on the left side at C3-C7 and on the right side at C3-4. Upper chest: Negative. Other: Negative. IMPRESSION: CT head: 1. Frontal scalp laceration.  No calvarial fracture. 2. No acute intracranial abnormality. 3. Stable chronic infarctions, microvascular ischemic changes, and parenchymal volume loss of the brain. CT cervical spine: C5 vertebral body anterior superior endplate acute minimally displaced fracture and fracture of right transverse process. No malalignment. No other fracture identified. These results were called by telephone at the time of interpretation on 06/22/2017 at 6:46 am to Dr. Deno Etienne , who verbally acknowledged these results. Electronically Signed   By: Kristine Garbe M.D.   On: 06/22/2017 06:48   Ct Cervical Spine Wo Contrast  Result Date: 06/22/2017 CLINICAL DATA:  77 y/o M; fall. History of dementia on blood thinners. EXAM: CT HEAD WITHOUT CONTRAST CT CERVICAL SPINE WITHOUT CONTRAST TECHNIQUE: Multidetector CT imaging of  the head and cervical spine was performed following the standard protocol without intravenous contrast. Multiplanar CT image reconstructions of the cervical spine were also generated. COMPARISON:  04/24/2017 CT head and cervical spine. FINDINGS: CT HEAD FINDINGS Brain: No evidence of acute infarction, hemorrhage, hydrocephalus, extra-axial collection or mass lesion/mass effect. 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 Stable chronic microvascular ischemic changes and parenchymal volume loss of the brain. Stable small chronic lacunar infarcts within bilateral basal ganglia. Vascular: Calcific atherosclerosis of carotid siphons. Skull: Frontal scalp laceration.  No calvarial fracture. Sinuses/Orbits: No acute finding. Other: None. CT CERVICAL SPINE FINDINGS Alignment: Normal. Skull base and vertebrae: C5 vertebral body anterior superior endplate acute minimally displaced limbus fracture and fracture of right transverse process (series 10, image 29 and series 11, image 13). No other fracture or dislocation identified. Soft tissues and spinal canal: No prevertebral fluid or swelling. No visible canal hematoma. Disc levels: Moderate cervical spondylosis with multilevel disc and facet degenerative changes. No high-grade bony canal stenosis. Uncovertebral and facet hypertrophy encroaches on the bony neural foramina on the left side at C3-C7 and on the right side at C3-4. Upper chest: Negative. Other: Negative. IMPRESSION: CT head: 1. Frontal scalp laceration.  No calvarial fracture. 2. No acute intracranial abnormality. 3. Stable chronic infarctions, microvascular ischemic changes, and parenchymal volume loss of the brain. CT cervical spine: C5 vertebral body anterior superior endplate acute minimally displaced fracture and fracture of right transverse process. No malalignment. No other fracture identified. These results were called by telephone at  the time of interpretation on 06/22/2017 at 6:46 am to Dr. Deno Etienne , who verbally acknowledged these results. Electronically Signed   By: Kristine Garbe M.D.   On: 06/22/2017 06:48    Impression/Plan: - Symptomatic anemia with occult blood positive stool. Hemoglobin 6.1 on admission. Patient denied any overt bleeding. - Status post fall with the C5 vertebral body endplate fracture. Currently has a  soft collar - History of  DVT/PE . Xarelto on hold./  - History of coronary artery disease  Recommendations --------------------------- - Conservative management for now. - monitor H&H. Transfuse to keep hemoglobin around 7. - Patient with the current C5 vertebral fracture. I am not sure it would be safe to do an endoscopy which will require manipulation of his neck.  - I Have discussed this with the admitting team. They  will discuss with neurosurgery. - Consider upper GI series along with barium edema if there is ongoing concern for possible  GI bleed to rule out any major lesion. - GI will follow   LOS: 1 day   Otis Brace  MD, FACP 06/23/2017, 3:55 PM  Contact #  [phone]

## 2017-06-23 NOTE — Progress Notes (Signed)
Initial Nutrition Assessment  DOCUMENTATION CODES:   Severe malnutrition in context of chronic illness  INTERVENTION:  Ensure Enlive po TID, each supplement provides 350 kcal and 20 grams of protein  NUTRITION DIAGNOSIS:   Severe Malnutrition related to chronic illness as evidenced by severe fat depletion, severe muscle depletion.  GOAL:   Patient will meet greater than or equal to 90% of their needs  MONITOR:   PO intake, I & O's, Labs, Weight trends, Supplement acceptance  REASON FOR ASSESSMENT:   Consult Assessment of nutrition requirement/status  ASSESSMENT:   PHU RECORD is a 77 y.o. male with medical history significant severe coronary artery disease status post stenting, ischemic cardiomyopathy status post AICD placement, history of pulmonary embolism on Xarelto, chronic anemia with low MCV, Crohn's disease, diabetes mellitus with chronic kidney disease stage III, and dementia who came into the ER after a fall at the facility CT revealed C5 vertebral body anterior superior endplate fracture.  Attempted to speak with patient at bedside but he is unable to provide any history. Per chart, it appears he has lost 10 pounds/8.2% of his total body weight in the past 2 months. PO intake documented yesterday was 75% It appears he gained 3 pounds over the past 4 days.  Labs reviewed Medications reviewed and include:  Remeron NS at 86m/hr  NUTRITION - FOCUSED PHYSICAL EXAM:    Most Recent Value  Orbital Region  Severe depletion  Upper Arm Region  Severe depletion  Thoracic and Lumbar Region  Severe depletion  Buccal Region  Severe depletion  Temple Region  Severe depletion  Clavicle Bone Region  Severe depletion  Clavicle and Acromion Bone Region  Severe depletion  Scapular Bone Region  Severe depletion  Dorsal Hand  Severe depletion  Patellar Region  Severe depletion  Anterior Thigh Region  Severe depletion  Posterior Calf Region  Severe depletion  Edema (RD  Assessment)  None  Hair  Reviewed  Eyes  Reviewed  Mouth  Reviewed  Skin  Reviewed  Nails  Reviewed       Diet Order:  Diet Heart Room service appropriate? Yes; Fluid consistency: Thin  EDUCATION NEEDS:   Not appropriate for education at this time  Skin:  Skin Assessment: Skin Integrity Issues: Skin Integrity Issues:: Stage II, Unstageable Stage II: to buttocks Unstageable: to heel  Last BM:  PTA  Height:   Ht Readings from Last 1 Encounters:  06/22/17 5' 3"  (1.6 m)    Weight:   Wt Readings from Last 1 Encounters:  06/23/17 111 lb 12.4 oz (50.7 kg)    Ideal Body Weight:  56.36 kg  BMI:  Body mass index is 19.8 kg/m.  Estimated Nutritional Needs:   Kcal:  1466-1600 calories (MSJ x1.3-1.4)  Protein:  71-86 grams (1.4-1.7g/kg)  Fluid:  >1.5L  WSatira Anis Chetan Mehring, MS, RD LDN Inpatient Clinical Dietitian Pager [phone]

## 2017-06-23 NOTE — Progress Notes (Signed)
PT Cancellation Note  Patient Details Name: Grant Lara MRN: [id number] DOB: [birth year]   Cancelled Treatment:    Reason Eval/Treat Not Completed: Medical issues which prohibited therapy.  Awaiting neurosurgery consult for C5 fracture, will ck later as time and pt allow.   Ramond Dial 06/23/2017, 9:20 AM   Mee Hives, PT MS Acute Rehab Dept. Number: [place] and [place]

## 2017-06-23 NOTE — Progress Notes (Signed)
PT Cancellation Note  Patient Details Name: Grant Lara MRN: [id number] DOB: 1941-01-19   Cancelled Treatment:    Reason Eval/Treat Not Completed: Other (comment).  Pt was receiving a blood transfusion when PT arrived and noted his bag was fairly full.  Talked with pt about asking nursing if enough time had elapsed to do therapy and he was suddenly adamant that he did not want to do this during transfusion.  Try again tomorrow.   Ramond Dial 06/23/2017, 4:26 PM   Mee Hives, PT MS Acute Rehab Dept. Number: [place] and [place]

## 2017-06-23 NOTE — Progress Notes (Signed)
PROGRESS NOTE  Grant Lara Mattie JSH:[id number] DOB: [birth year] DOA: 06/22/2017 PCP: Gildardo Cranker, DO  HPI/Recap of past 60 hours: 77 year old male with past medical history of severe cardiomyopathy, pulmonary embolism on Xarelto, diabetes mellitus with stage III chronic kidney disease and mild dementia who presented from his skilled nursing facility after a fall after passing out.  Patient found to be orthostatic as well as have a hemoglobin of 6.1.  Hemoglobin 2 months prior at 8.5.  Also found to have a C5 vertebral body superior endplate fracture.  Patient transfused 2 units packed red blood cells.  Case discussed with neurosurgery who recommended soft collar and office follow-up in 2-3-week.  Admitted to the hospitalist service.  Seen this morning and blood pressure still soft, although initial hemoglobin up to 8.  Patient himself complains of some neck pain, tolerating soft collar.  Follow-up hemoglobin 12 hours later down to 7.6.  Assessment/Plan: Principal Problem:   Acute on chronic blood loss anemia ([place]): Certainly concerning given that he is on Xarelto.  I do not think that this is from anemia of chronic disease from renal failure only given that and review of previous labs, hemoglobin 2 units higher 2 months ago, he presented symptomatically and orthostatic leading to a severe fall and Hemoccult positive.  Transfuse him 2 more units given drop again in hemoglobin, need to keep hemoglobin above 8 given heart tissues.  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  Currently on hold given bleeding issues.   Anemia, chronic disease   CKD (chronic kidney disease) stage 3, GFR 30-59 ml/min (HCC): Stable   Coronary artery disease involving native coronary artery of native heart without angina  pectoris    Protein-calorie malnutrition, severe ([place]): Patient meets criteria in the context of chronic illness, evidenced by severe fat depletion and severe muscle depletion.  On Ensure 3 times daily.  Seen by nutrition.   Vascular dementia without behavioral disturbance: Stable.  Oriented x2   ICD (implantable cardioverter-defibrillator) in place   DM type 2 causing CKD stage 3 ([place]): Sliding scale only   Weight loss, non-intentional C5 vertebral fracture: Soft, alert, outpatient neurosurgical follow-up    Code Status: DNR   Family Communication: Left message for family  Disposition Plan: Here for several days until hemoglobin stabilized, decision whether or not to restart Xarelto   Consultants:  GI  Procedures:  Blood unit transfusion 3/10 and 3/11  Antimicrobials:  None  DVT prophylaxis: SCDs   Objective: Vitals:   06/23/17 0600 06/23/17 0741 06/23/17 0744 06/23/17 0747  BP: (!) 110/55 (!) 108/54 (!) 90/55 (!) 88/50  Pulse: 70 74 91 (!) 102  Resp: 16 18    Temp: 98.6 F (37 C) 98.5 F (36.9 C)    TempSrc: Oral Oral    SpO2: 100% 90%    Weight:      Height:        Intake/Output Summary (Last 24 hours) at 06/23/2017 1440 Last data filed at 06/23/2017 1439 Gross per 24 hour  Intake 2937.75 ml  Output 425 ml  Net 2512.75 ml   Filed Weights   06/22/17 0556 06/23/17 0500  Weight: 49 kg (108 lb) 50.7 kg (111 lb 12.4 oz)   Body mass index is 19.8 kg/m.  Exam:   General: Oriented x2, no acute distress  HEENT: Normocephalic, mucous members dry  Neck: C-collar in place  Cardiovascular: Regular rate and rhythm, S1-S2, 2 out of 6 systolic ejection murmur  Respiratory: Clear to auscultation bilaterally  Abdomen: Soft, nontender, nondistended, positive bowel sounds  Musculoskeletal: No clubbing cyanosis or edema  Psychiatry: Appropriate, underlying dementia   Data Reviewed: CBC: Recent Labs  Lab 06/22/17 0707 06/22/17 0825 06/22/17 1430  06/23/17 0621 06/23/17 1229  WBC  --  10.1  --   --   --   NEUTROABS  --  8.0*  --   --   --   HGB 6.8* 6.1* 7.3* 8.0* 7.6*  HCT 20.0* 21.4* 25.2* 26.4* 25.5*  MCV  --  74.0*  --   --   --   PLT  --  381  --   --   --    Basic Metabolic Panel: Recent Labs  Lab 06/22/17 0707 06/22/17 0825 06/22/17 1430 06/23/17 0621  NA 140 139  --  136  K 4.7 4.8  --  4.1  CL 104 105  --  102  CO2  --  23  --  24  GLUCOSE 110* 104*  --  87  BUN 18 14  --  14  CREATININE 1.10 1.17  --  1.08  CALCIUM  --  8.8*  --  8.4*  MG  --   --  1.8  --    GFR: Estimated Creatinine Clearance: 40.4 mL/min (by C-G formula based on SCr of 1.08 mg/dL). Liver Function Tests: No results for input(s): AST, ALT, ALKPHOS, BILITOT, PROT, ALBUMIN in the last 168 hours. No results for input(s): LIPASE, AMYLASE in the last 168 hours. No results for input(s): AMMONIA in the last 168 hours. Coagulation Profile: No results for input(s): INR, PROTIME in the last 168 hours. Cardiac Enzymes: No results for input(s): CKTOTAL, CKMB, CKMBINDEX, TROPONINI in the last 168 hours. BNP (last 3 results) No results for input(s): PROBNP in the last 8760 hours. HbA1C: No results for input(s): HGBA1C in the last 72 hours. CBG: Recent Labs  Lab 06/23/17 0813  GLUCAP 78   Lipid Profile: No results for input(s): CHOL, HDL, LDLCALC, TRIG, CHOLHDL, LDLDIRECT in the last 72 hours. Thyroid Function Tests: No results for input(s): TSH, T4TOTAL, FREET4, T3FREE, THYROIDAB in the last 72 hours. Anemia Panel: No results for input(s): VITAMINB12, FOLATE, FERRITIN, TIBC, IRON, RETICCTPCT in the last 72 hours. Urine analysis:    Component Value Date/Time   COLORURINE YELLOW 04/24/2017 0424   APPEARANCEUR CLOUDY (A) 04/24/2017 0424   LABSPEC >1.030 (H) 04/24/2017 0424   PHURINE 5.0 04/24/2017 0424   GLUCOSEU NEGATIVE 04/24/2017 0424   HGBUR SMALL (A) 04/24/2017 0424   BILIRUBINUR SMALL (A) 04/24/2017 0424   KETONESUR 15 (A)  04/24/2017 0424   PROTEINUR NEGATIVE 04/24/2017 0424   UROBILINOGEN 1.0 12/21/2014 1520   NITRITE NEGATIVE 04/24/2017 0424   LEUKOCYTESUR TRACE (A) 04/24/2017 0424   Sepsis Labs: @LABRCNTIP (procalcitonin:4,lacticidven:4)  )No results found for this or any previous visit (from the past 240 hour(s)).    Studies: No results found.  Scheduled Meds: . donepezil  10 mg Oral QHS  . gabapentin  100 mg Oral QHS  . latanoprost  1 drop Both Eyes QHS  . levETIRAcetam  500 mg Oral BID  . mirtazapine  7.5 mg Oral QHS  . sertraline  75 mg Oral Daily  . sodium chloride flush  3 mL Intravenous Q12H  . tamsulosin  0.4 mg Oral QHS  . traMADol  50 mg Oral TID    Continuous Infusions: . sodium chloride 1,000 mL (06/23/17 1439)     LOS: 1 day     Annita Brod, MD [facility]  To reach me or the doctor on call, go to: [email or web address] Password [email or web address]  06/23/2017, 2:40 PM

## 2017-06-23 NOTE — Evaluation (Addendum)
Occupational Therapy Evaluation Patient Details Name: Grant Lara MRN: [id number] DOB: Aug 20, 1940 Today's Date: 06/23/2017    History of Present Illness Pt is a 77 y.o. male with medical history significant severe coronary artery disease status post stenting, ischemic cardiomyopathy status post AICD placement, history of pulmonary embolism on Xarelto, chronic anemia with low MCV, Crohn's disease, diabetes mellitus with chronic kidney disease stage III, and dementia who came into the ER after a fall at his facility. CT revealed C5 vertebral body anterior superior endplate fracture. Per H&P, Dr. Ronnald Ramp with nuerosurgery, pt to wear soft collar and follow-up in office in 2-3 weeks.   Clinical Impression   PTA, pt was living at [facility] and had assistance for ADL. He reports that he was able to ambulate with RW. Pt presents with generalized weakness, decreased ability to follow commands, and decreased ability to adhere to cervical precautions secondary to history of dementia. Pt required mod assist for stand-pivot simulated toilet transfer taking pivotal steps with RW today. He requires min assist for UB ADL and mod assist for LB ADL. Feel pt would benefit from continued OT services while admitted and recommend continued rehabilitation at SNF level post-acute D/C.    Follow Up Recommendations  SNF;Supervision/Assistance - 24 hour    Equipment Recommendations  Other (comment)(TBD at next venue of care)    Recommendations for Other Services       Precautions / Restrictions Precautions Precautions: Fall;Cervical Precaution Comments: Assisted pt to adhere to precautions. Wearing collar throughout.  Required Braces or Orthoses: Cervical Brace Cervical Brace: Soft collar(per MD) Restrictions Weight Bearing Restrictions: No      Mobility Bed Mobility Overal bed mobility: Needs Assistance Bed Mobility: Supine to Sit     Supine to sit: Min assist     General bed mobility comments: Min  assist to power trunk from [facility].  Transfers Overall transfer level: Needs assistance Equipment used: Rolling walker (2 wheeled) Transfers: Sit to/from Stand Sit to Stand: Min assist         General transfer comment: Min assist to power up and steady himself.     Balance Overall balance assessment: Needs assistance Sitting-balance support: No upper extremity supported;Feet supported Sitting balance-Leahy Scale: Fair     Standing balance support: Bilateral upper extremity supported;No upper extremity supported;During functional activity Standing balance-Leahy Scale: Poor Standing balance comment: Requires both handheld assistance and B UE support.                            ADL either performed or assessed with clinical judgement   ADL Overall ADL's : Needs assistance/impaired Eating/Feeding: Sitting;Supervision/ safety   Grooming: Sitting;Minimal assistance   Upper Body Bathing: Sitting;Minimal assistance   Lower Body Bathing: Sit to/from stand;Moderate assistance   Upper Body Dressing : Minimal assistance;Sitting   Lower Body Dressing: Sit to/from stand;Moderate assistance   Toilet Transfer: Moderate assistance;Ambulation;RW Toilet Transfer Details (indicate cue type and reason): Assist to maintain balance as well as to manage RW.  Toileting- Clothing Manipulation and Hygiene: Maximal assistance;Sit to/from stand       Functional mobility during ADLs: Moderate assistance;Rolling walker General ADL Comments: Mod assist for mobility throughout tasks. Multimodal cues for safety and processing.      Vision Patient Visual Report: No change from baseline Vision Assessment?: Vision impaired- to be further tested in functional context Additional Comments: Need to assess eye hand coordination as well as tracking, etc.  Noted poor ability to  reach to target.      Perception Perception Spatial deficits: Poor ability to reach toward self-care objects and utensils  for self-feeding.    Praxis      Pertinent Vitals/Pain Pain Assessment: No/denies pain     Hand Dominance     Extremity/Trunk Assessment Upper Extremity Assessment Upper Extremity Assessment: Generalized weakness   Lower Extremity Assessment Lower Extremity Assessment: Generalized weakness       Communication Communication Communication: Expressive difficulties   Cognition Arousal/Alertness: Awake/alert Behavior During Therapy: Anxious Overall Cognitive Status: History of cognitive impairments - at baseline                                 General Comments: History of dementia. Difficulty with word finding and processing. Able to follow directions with multimodal cues.    General Comments  Pt reports "I'm wet" but no leak from condom catheter noted with all linens clean and dry.     Exercises     Shoulder Instructions      Home Living Family/patient expects to be discharged to:: Skilled nursing facility                                 Additional Comments: Lives at [facility]       Prior Functioning/Environment Level of Independence: Needs assistance  Gait / Transfers Assistance Needed: Reports use of RW with staff assist ADL's / Homemaking Assistance Needed: Assist from staff at SNF   Comments: Pt unable to report PLOF this session.         OT Problem List: Decreased strength;Decreased range of motion;Decreased activity tolerance;Impaired balance (sitting and/or standing);Decreased safety awareness;Decreased knowledge of use of DME or AE;Decreased knowledge of precautions;Decreased cognition;Decreased coordination;Pain      OT Treatment/Interventions: Self-care/ADL training;Therapeutic exercise;DME and/or AE instruction;Energy conservation;Therapeutic activities;Cognitive remediation/compensation;Visual/perceptual remediation/compensation;Patient/family education;Balance training    OT Goals(Current goals can be found in the care  plan section) Acute Rehab OT Goals Patient Stated Goal: get hot coffee OT Goal Formulation: With patient Time For Goal Achievement: 07/07/17 Potential to Achieve Goals: Good  OT Frequency: Min 1X/week   Barriers to D/C:            Co-evaluation              AM-PAC PT "6 Clicks" Daily Activity     Outcome Measure Help from another person eating meals?: A Little Help from another person taking care of personal grooming?: A Little Help from another person toileting, which includes using toliet, bedpan, or urinal?: A Lot Help from another person bathing (including washing, rinsing, drying)?: A Lot Help from another person to put on and taking off regular upper body clothing?: A Little Help from another person to put on and taking off regular lower body clothing?: A Little 6 Click Score: 16   End of Session Equipment Utilized During Treatment: Gait belt;Rolling walker Nurse Communication: Mobility status  Activity Tolerance: Patient tolerated treatment well Patient left: in chair;with call bell/phone within reach  OT Visit Diagnosis: Other abnormalities of gait and mobility (R26.89);Other symptoms and signs involving cognitive function                Time: 6644-0347 OT Time Calculation (min): 29 min Charges:  OT General Charges $OT Visit: 1 Visit OT Evaluation $OT Eval Moderate Complexity: 1 Mod OT Treatments $Self Care/Home Management : 8-22 mins  G-Codes:     Norman Herrlich, MS OTR/L  Pager: Reid Hope King A Wasyl Dornfeld 06/23/2017, 2:11 PM

## 2017-06-24 LAB — CBC
HEMATOCRIT: 33.4 % — AB (ref 39.0–52.0)
Hemoglobin: 10.8 g/dL — ABNORMAL LOW (ref 13.0–17.0)
MCH: 25.9 pg — AB (ref 26.0–34.0)
MCHC: 32.3 g/dL (ref 30.0–36.0)
MCV: 80.1 fL (ref 78.0–100.0)
PLATELETS: 298 10*3/uL (ref 150–400)
RBC: 4.17 MIL/uL — ABNORMAL LOW (ref 4.22–5.81)
RDW: 19.7 % — AB (ref 11.5–15.5)
WBC: 10.9 10*3/uL — ABNORMAL HIGH (ref 4.0–10.5)

## 2017-06-24 LAB — PREPARE RBC (CROSSMATCH)

## 2017-06-24 LAB — BASIC METABOLIC PANEL
Anion gap: 15 (ref 5–15)
BUN: 11 mg/dL (ref 6–20)
CO2: 22 mmol/L (ref 22–32)
CREATININE: 1.15 mg/dL (ref 0.61–1.24)
Calcium: 8.6 mg/dL — ABNORMAL LOW (ref 8.9–10.3)
Chloride: 103 mmol/L (ref 101–111)
GFR calc Af Amer: >60 mL/min (ref >60)
GFR calc non Af Amer: 59 mL/min — ABNORMAL LOW (ref >60)
Glucose, Bld: 72 mg/dL (ref 65–99)
POTASSIUM: 4.2 mmol/L (ref 3.5–5.1)
Sodium: 140 mmol/L (ref 135–145)

## 2017-06-24 LAB — GLUCOSE, CAPILLARY
GLUCOSE-CAPILLARY: 111 mg/dL — AB (ref 65–99)
GLUCOSE-CAPILLARY: 205 mg/dL — AB (ref 65–99)
Glucose-Capillary: 72 mg/dL (ref 65–99)
Glucose-Capillary: 97 mg/dL (ref 65–99)

## 2017-06-24 MED ORDER — FUROSEMIDE 10 MG/ML IJ SOLN
20.0000 mg | Freq: Once | INTRAMUSCULAR | Status: AC
  Administered 2017-06-24: 20 mg via INTRAVENOUS
  Filled 2017-06-24: qty 2

## 2017-06-24 MED ORDER — LORAZEPAM 2 MG/ML IJ SOLN: 0.5000 mg | Freq: Four times a day (QID) | INTRAMUSCULAR | Status: DC | PRN

## 2017-06-24 NOTE — Evaluation (Signed)
Physical Therapy Evaluation Patient Details Name: Grant Lara MRN: [id number] DOB: [birth year] Today's Date: 06/24/2017   History of Present Illness  Pt is a 77 y.o. male with medical history significant severe coronary artery disease status post stenting, ischemic cardiomyopathy status post AICD placement, history of pulmonary embolism on Xarelto, chronic anemia with low MCV, Crohn's disease, diabetes mellitus with chronic kidney disease stage III, and dementia who came into the ER after a fall at his facility. CT revealed C5 vertebral body anterior superior endplate fracture. Per H&P, Dr. Ronnald Ramp with nuerosurgery, pt to wear soft collar and follow-up in office in 2-3 weeks.  Clinical Impression  PTA pt resident at [facility], walking with RW, assist with ADLs. Pt with baseline cognitive deficits and had difficulty recalling information about his prior level of function. Pt currently limited in his safe mobility by orthostatic hypotension (see Vitals). Pt is minA for bed mobility and transfers with RW. Pt will benefit from rehab in his SNF to regain his strength and balance to safely mobilize in his environment> PT to follow acutely until d/c.    Follow Up Recommendations SNF    Equipment Recommendations  None recommended by PT    Recommendations for Other Services       Precautions / Restrictions Precautions Precautions: Fall;Cervical Precaution Comments: Assisted pt to adhere to precautions. Wearing collar throughout.  Required Braces or Orthoses: Cervical Brace Cervical Brace: Hard collar(per MD) Restrictions Weight Bearing Restrictions: No      Mobility  Bed Mobility Overal bed mobility: Needs Assistance Bed Mobility: Supine to Sit     Supine to sit: Min assist     General bed mobility comments: Min assist to power trunk from [facility].  Transfers Overall transfer level: Needs assistance Equipment used: Rolling walker (2 wheeled) Transfers: Sit to/from Stand Sit to Stand:  Min assist;From elevated surface         General transfer comment: Min assist to power up and steady himself.   Ambulation/Gait             General Gait Details: did not attempt due to orthostatics       Balance Overall balance assessment: Needs assistance Sitting-balance support: No upper extremity supported;Feet supported Sitting balance-Leahy Scale: Fair     Standing balance support: Bilateral upper extremity supported;No upper extremity supported;During functional activity Standing balance-Leahy Scale: Poor Standing balance comment: Requires both handheld assistance and B UE support.                              Pertinent Vitals/Pain Pain Assessment: 0-10 Pain Score: 10-Worst pain ever Pain Location: neck and back  Pain Descriptors / Indicators: Aching;Grimacing Pain Intervention(s): Limited activity within patient's tolerance;Monitored during session;Repositioned  Prior to activity HR 68 bpm, SaO2 on RA 99% O2, with standing HR 76 bpm, SaO2 on RA 97% O2  Orthostatic BPs  Supine 106/64  Sitting 93/63  Standing 70/50  Returned to Supine 111/56   Unable to tolerate standing for 3 min for additional standing BP   Home Living Family/patient expects to be discharged to:: Skilled nursing facility                 Additional Comments: Lives at [facility]     Prior Function Level of Independence: Needs assistance   Gait / Transfers Assistance Needed: Reports use of RW with staff assist  ADL's / Homemaking Assistance Needed: Assist from staff at SNF  Comments: Pt  unable to report PLOF this session.         Extremity/Trunk Assessment   Upper Extremity Assessment Upper Extremity Assessment: Defer to OT evaluation    Lower Extremity Assessment Lower Extremity Assessment: Difficult to assess due to impaired cognition       Communication   Communication: Expressive difficulties  Cognition Arousal/Alertness: Awake/alert Behavior During  Therapy: Anxious Overall Cognitive Status: History of cognitive impairments - at baseline                                 General Comments: History of dementia. Difficulty with word finding and processing. Able to follow directions with multimodal cues.              Assessment/Plan    PT Assessment Patient needs continued PT services  PT Problem List Decreased activity tolerance;Decreased balance;Decreased mobility;Cardiopulmonary status limiting activity;Pain       PT Treatment Interventions Gait training;DME instruction;Functional mobility training;Therapeutic activities;Therapeutic exercise;Balance training;Cognitive remediation;Patient/family education    PT Goals (Current goals can be found in the Care Plan section)  Acute Rehab PT Goals Patient Stated Goal: none stated PT Goal Formulation: With patient Time For Goal Achievement: 07/08/17 Potential to Achieve Goals: Good    Frequency Min 2X/week    AM-PAC PT "6 Clicks" Daily Activity  Outcome Measure Difficulty turning over in bed (including adjusting bedclothes, sheets and blankets)?: Unable Difficulty moving from lying on back to sitting on the side of the bed? : Unable Difficulty sitting down on and standing up from a chair with arms (e.g., wheelchair, bedside commode, etc,.)?: Unable Help needed moving to and from a bed to chair (including a wheelchair)?: Total Help needed walking in hospital room?: Total Help needed climbing 3-5 steps with a railing? : Total 6 Click Score: 6    End of Session Equipment Utilized During Treatment: Gait belt Activity Tolerance: (Pt limited by orthostatic hypotension) Patient left: in bed;with call bell/phone within reach;with bed alarm set Nurse Communication: Mobility status PT Visit Diagnosis: Unsteadiness on feet (R26.81);Dizziness and giddiness (R42);Difficulty in walking, not elsewhere classified (R26.2);Other abnormalities of gait and mobility (R26.89);Muscle  weakness (generalized) (M62.81)    Time: 4193-7902 PT Time Calculation (min) (ACUTE ONLY): 27 min   Charges:   PT Evaluation $PT Eval Moderate Complexity: 1 Mod PT Treatments $Therapeutic Activity: 8-22 mins   PT G Codes:        Neftali Abair B. Migdalia Dk PT, DPT Acute Rehabilitation  [phone] Pager [phone]    [place] 06/24/2017, 4:09 PM

## 2017-06-24 NOTE — Progress Notes (Signed)
CSW left another voicemail for patient's wife and son to complete assessment for return to SNF.   Percell Locus Jaziyah Gradel LCSW [phone]

## 2017-06-24 NOTE — Progress Notes (Signed)
PROGRESS NOTE  Grant Lara ZOX:[id number] DOB: 27-Jul-1940 DOA: 06/22/2017 PCP: Gildardo Cranker, DO  HPI/Recap of past 86 hours: 77 year old male with past medical history of severe cardiomyopathy, pulmonary embolism on Xarelto, diabetes mellitus with stage III chronic kidney disease and mild dementia who presented from his skilled nursing facility after a fall after passing out.  Patient found to be orthostatic as well as have a hemoglobin of 6.1.  Hemoglobin 2 months prior at 8.5.  Also found to have a C5 vertebral body superior endplate fracture.  Patient transfused 2 units packed red blood cells.  Case discussed with neurosurgery who recommended soft collar and office follow-up in 2-3-week.  Admitted to the hospitalist service.  Following admission, blood pressures remained soft.  No evidence of further bleeding visually, however hemoglobin which did come up to as high as 8, 12 hours later down to 7.6 on 3/11.  Patient transfused 2 more units packed red blood cells.    Initially, patient with no complaints.  Hemoglobin this morning at 10.  GI holding off on plans for endoscopy.  Checking upper GI series.  Patient by this afternoon becoming more agitated, requiring as needed Ativan  Assessment/Plan: Principal Problem:   Acute on chronic blood loss anemia ([place]): Certainly concerning given that he is on Xarelto.  I do not think that this is from anemia of chronic disease from renal failure only, given that in review of previous labs, hemoglobin 2 units higher 2 months ago, he presented symptomatically and orthostatic leading to a severe fall and Hemoccult positive.  Transfused now total of 4 units, hemoglobin at 10.  Repeat hemoglobin in the morning.  GI following as well.  Because of his cervical fracture and collar, they are holding off on endoscopy.  Trying to confirm with neurosurgery on what he can and cannot tolerate.  In the meantime, upper GI series which if negative, anticoagulation could be  restarted.  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  Give additional dose of Lasix as patient still somewhat tachycardic and slightly volume overloaded   COPD (chronic obstructive pulmonary disease) (HCC)   DVT (deep venous thrombosis) (HCC)/PE: On Xarelto.  Currently on hold given bleeding issues.  Restart if upper GI series negative   Anemia, chronic disease   CKD (chronic kidney disease) stage 3, GFR 30-59 ml/min (HCC): Stable   Coronary artery disease involving native coronary artery of native heart without angina pectoris    Protein-calorie malnutrition, severe ([place]): Patient meets criteria in the context of chronic illness, evidenced by severe fat depletion and severe muscle depletion.  On Ensure 3 times daily.  Seen by nutrition.   Vascular dementia with behavioral disturbance: Initially stable without behavioral disturbance, but by afternoon of 3/12, at times agitated by nursing.  Have started on as needed Ativan   ICD (implantable cardioverter-defibrillator) in place   DM type 2 causing CKD stage 3 ([place]): Sliding scale only   Weight loss, non-intentional C5 vertebral fracture: Soft, alert, outpatient neurosurgical follow-up    Code Status: DNR   Family Communication: Left message for family  Disposition Plan: Here for several days until hemoglobin stabilized, decision whether or not to restart Xarelto, then back to skilled nursing   Consultants:  GI  Procedures:  Blood unit transfusion 3/10 and 3/11  Antimicrobials:  None  DVT prophylaxis: SCDs   Objective: Vitals:   06/23/17 2348 06/24/17 0550 06/24/17 0639 06/24/17 1424  BP: 128/68 (!) 114/59  (!) 95/57  Pulse: 61 65  97  Resp: 19 12  14   Temp: (!) 97.5 F (36.4 C) 98.2 F (36.8 C)  98.4 F (36.9 C)  TempSrc: Oral Oral  Oral  SpO2: 100% 100%    Weight:   51.5 kg (113 lb 8.6 oz)   Height:        Intake/Output  Summary (Last 24 hours) at 06/24/2017 1716 Last data filed at 06/24/2017 1500 Gross per 24 hour  Intake 2598.25 ml  Output 2950 ml  Net -351.75 ml   Filed Weights   06/22/17 0556 06/23/17 0500 06/24/17 0639  Weight: 49 kg (108 lb) 50.7 kg (111 lb 12.4 oz) 51.5 kg (113 lb 8.6 oz)   Body mass index is 20.11 kg/m.  Exam:   General: Oriented x2, no acute distress  HEENT: Normocephalic, mucous members dry  Neck: C-collar in place  Cardiovascular: Regular rate and rhythm, S1-S2, 2 out of 6 systolic ejection murmur  Respiratory: Clear to auscultation bilaterally  Abdomen: Soft, nontender, nondistended, positive bowel sounds  Musculoskeletal: No clubbing cyanosis or edema  Psychiatry: Appropriate, underlying dementia   Data Reviewed: CBC: Recent Labs  Lab 06/22/17 0825 06/22/17 1430 06/23/17 0621 06/23/17 1229 06/24/17 0503  WBC 10.1  --   --   --  10.9*  NEUTROABS 8.0*  --   --   --   --   HGB 6.1* 7.3* 8.0* 7.6* 10.8*  HCT 21.4* 25.2* 26.4* 25.5* 33.4*  MCV 74.0*  --   --   --  80.1  PLT 381  --   --   --  366   Basic Metabolic Panel: Recent Labs  Lab 06/22/17 0707 06/22/17 0825 06/22/17 1430 06/23/17 0621 06/24/17 0503  NA 140 139  --  136 140  K 4.7 4.8  --  4.1 4.2  CL 104 105  --  102 103  CO2  --  23  --  24 22  GLUCOSE 110* 104*  --  87 72  BUN 18 14  --  14 11  CREATININE 1.10 1.17  --  1.08 1.15  CALCIUM  --  8.8*  --  8.4* 8.6*  MG  --   --  1.8  --   --    GFR: Estimated Creatinine Clearance: 38.6 mL/min (by C-G formula based on SCr of 1.15 mg/dL). Liver Function Tests: No results for input(s): AST, ALT, ALKPHOS, BILITOT, PROT, ALBUMIN in the last 168 hours. No results for input(s): LIPASE, AMYLASE in the last 168 hours. No results for input(s): AMMONIA in the last 168 hours. Coagulation Profile: No results for input(s): INR, PROTIME in the last 168 hours. Cardiac Enzymes: No results for input(s): CKTOTAL, CKMB, CKMBINDEX, TROPONINI in  the last 168 hours. BNP (last 3 results) No results for input(s): PROBNP in the last 8760 hours. HbA1C: No results for input(s): HGBA1C in the last 72 hours. CBG: Recent Labs  Lab 06/23/17 1735 06/23/17 2131 06/24/17 0744 06/24/17 1150 06/24/17 1649  GLUCAP 121* 146* 72 97 111*   Lipid Profile: No results for input(s): CHOL, HDL, LDLCALC, TRIG, CHOLHDL, LDLDIRECT in the last 72 hours. Thyroid Function Tests: No results for input(s): TSH, T4TOTAL, FREET4, T3FREE, THYROIDAB in the last 72 hours. Anemia Panel: No results for input(s): VITAMINB12, FOLATE, FERRITIN, TIBC, IRON, RETICCTPCT in the last 72 hours. Urine analysis:    Component Value Date/Time   COLORURINE YELLOW 04/24/2017 0424   APPEARANCEUR CLOUDY (A) 04/24/2017 0424  LABSPEC >1.030 (H) 04/24/2017 0424   PHURINE 5.0 04/24/2017 0424   GLUCOSEU NEGATIVE 04/24/2017 0424   HGBUR SMALL (A) 04/24/2017 0424   BILIRUBINUR SMALL (A) 04/24/2017 0424   KETONESUR 15 (A) 04/24/2017 0424   PROTEINUR NEGATIVE 04/24/2017 0424   UROBILINOGEN 1.0 12/21/2014 1520   NITRITE NEGATIVE 04/24/2017 0424   LEUKOCYTESUR TRACE (A) 04/24/2017 0424   Sepsis Labs: @LABRCNTIP (procalcitonin:4,lacticidven:4)  )No results found for this or any previous visit (from the past 240 hour(s)).    Studies: No results found.  Scheduled Meds: . donepezil  10 mg Oral QHS  . gabapentin  100 mg Oral QHS  . insulin aspart  0-9 Units Subcutaneous TID WC  . latanoprost  1 drop Both Eyes QHS  . levETIRAcetam  500 mg Oral BID  . mirtazapine  7.5 mg Oral QHS  . sertraline  75 mg Oral Daily  . sodium chloride flush  3 mL Intravenous Q12H  . tamsulosin  0.4 mg Oral QHS  . traMADol  50 mg Oral TID    Continuous Infusions:    LOS: 2 days     Annita Brod, MD [facility]  To reach me or the doctor on call, go to: [email or web address] Password [email or web address]  06/24/2017, 5:16 PM

## 2017-06-24 NOTE — Progress Notes (Signed)
[facility] Gastroenterology Progress Note  Grant Lara 77 y.o. 1941-02-22  CC: Anemia, occult blood positive stool   Subjective: Patient denied any further bleeding episode. Hemoglobin improved after blood transfusion.  ROS : Negative for chest pain and shortness of breath.   Objective: Vital signs in last 24 hours: Vitals:   06/23/17 2348 06/24/17 0550  BP: 128/68 (!) 114/59  Pulse: 61 65  Resp: 19 12  Temp: (!) 97.5 F (36.4 C) 98.2 F (36.8 C)  SpO2: 100% 100%    Physical Exam: Gen. Alert but somewhat confused. Neck : Soft collar in place. Abdomen. Soft, nontender, nondistended, bowel sounds present. Scar marks from previous surgery noted. Lower extremity. No significant edema  Lab Results: Recent Labs    06/22/17 1430 06/23/17 0621 06/24/17 0503  NA  --  136 140  K  --  4.1 4.2  CL  --  102 103  CO2  --  24 22  GLUCOSE  --  87 72  BUN  --  14 11  CREATININE  --  1.08 1.15  CALCIUM  --  8.4* 8.6*  MG 1.8  --   --    No results for input(s): AST, ALT, ALKPHOS, BILITOT, PROT, ALBUMIN in the last 72 hours. Recent Labs    06/22/17 0825  06/23/17 1229 06/24/17 0503  WBC 10.1  --   --  10.9*  NEUTROABS 8.0*  --   --   --   HGB 6.1*   < > 7.6* 10.8*  HCT 21.4*   < > 25.5* 33.4*  MCV 74.0*  --   --  80.1  PLT 381  --   --  298   < > = values in this interval not displayed.   No results for input(s): LABPROT, INR in the last 72 hours.    Assessment/Plan: - Symptomatic anemia with occult blood positive stool. Hemoglobin 6.1 on admission. Patient denied any overt bleeding. - Status post fall with the C5 vertebral body endplate fracture. Currently has a soft collar - History of  DVT/PE . Xarelto on hold./  - History of coronary artery disease   Recommendations --------------------------- -  No evidence of overt bleeding. Hemoglobin improved to 10.8 after blood transfusion. - Patient with the current C5 vertebral fracture. I am not sure it would be safe to  do an endoscopy which will require manipulation of his neck.  - I Have discussed this with the admitting team. They  will discuss with neurosurgery. -  upper GI series to rule out ulcer disease. - If upper GI series negative, consider resuming anticoagulation. - Recommend outpatient barium anemia or virtual colonoscopy once acute issues are resolved. - GI will follow   Otis Brace MD, [place] 06/24/2017, 11:21 AM  Contact #  [phone]

## 2017-06-25 ENCOUNTER — Other Ambulatory Visit: Payer: Self-pay

## 2017-06-25 ENCOUNTER — Inpatient Hospital Stay (HOSPITAL_COMMUNITY): Payer: Medicare Other

## 2017-06-25 DIAGNOSIS — N183 Chronic kidney disease, stage 3 (moderate): Secondary | ICD-10-CM

## 2017-06-25 DIAGNOSIS — D600 Chronic acquired pure red cell aplasia: Secondary | ICD-10-CM

## 2017-06-25 LAB — BASIC METABOLIC PANEL
ANION GAP: 8 (ref 5–15)
BUN: 15 mg/dL (ref 6–20)
CO2: 27 mmol/L (ref 22–32)
Calcium: 8.9 mg/dL (ref 8.9–10.3)
Chloride: 104 mmol/L (ref 101–111)
Creatinine, Ser: 1.07 mg/dL (ref 0.61–1.24)
GFR calc Af Amer: >60 mL/min (ref >60)
Glucose, Bld: 57 mg/dL — ABNORMAL LOW (ref 65–99)
Potassium: 4.3 mmol/L (ref 3.5–5.1)
SODIUM: 139 mmol/L (ref 135–145)

## 2017-06-25 LAB — GLUCOSE, CAPILLARY
GLUCOSE-CAPILLARY: 65 mg/dL (ref 65–99)
GLUCOSE-CAPILLARY: 98 mg/dL (ref 65–99)
GLUCOSE-CAPILLARY: 99 mg/dL (ref 65–99)
Glucose-Capillary: 73 mg/dL (ref 65–99)
Glucose-Capillary: 130 mg/dL — ABNORMAL HIGH (ref 65–99)

## 2017-06-25 LAB — CBC
HCT: 37.8 % — ABNORMAL LOW (ref 39.0–52.0)
HEMOGLOBIN: 11.4 g/dL — AB (ref 13.0–17.0)
MCH: 24.9 pg — ABNORMAL LOW (ref 26.0–34.0)
MCHC: 30.2 g/dL (ref 30.0–36.0)
MCV: 82.5 fL (ref 78.0–100.0)
Platelets: 322 10*3/uL (ref 150–400)
RBC: 4.58 MIL/uL (ref 4.22–5.81)
RDW: 20.6 % — AB (ref 11.5–15.5)
WBC: 11.2 10*3/uL — AB (ref 4.0–10.5)

## 2017-06-25 LAB — MRSA PCR SCREENING: MRSA by PCR: NEGATIVE

## 2017-06-25 MED ORDER — RIVAROXABAN 20 MG PO TABS
20.0000 mg | ORAL_TABLET | Freq: Every day | ORAL | Status: DC
  Administered 2017-06-25: 20 mg via ORAL
  Filled 2017-06-25: qty 1

## 2017-06-25 NOTE — Clinical Social Work Note (Signed)
Clinical Social Work Assessment  Patient Details  Name: Grant Lara MRN: [id number] Date of Birth: [birth year]  Date of referral:  06/25/17               Reason for consult:  Discharge Planning                Permission sought to share information with:  Facility Sport and exercise psychologist, Family Supports Permission granted to share information::  No  Name::     Emergency planning/management officer::  [place]  Relationship::  Son  Sport and exercise psychologist Information:     Housing/Transportation Living arrangements for the past 2 months:  [name] of Information:  Spouse Patient Interpreter Needed:  None Criminal Activity/Legal Involvement Pertinent to Current Situation/Hospitalization:  No - Comment as needed Significant Relationships:  Adult Children, Spouse Lives with:  Facility Resident Do you feel safe going back to the place where you live?  Yes Need for family participation in patient care:  Yes (Comment)  Care giving concerns:  CSW received consult regarding discharge planning. CSW spoke with patient's wife. She reported that patient is long term care at [place] (now [place]). She would like him to return at discharge. CSW to continue to follow and assist with discharge planning needs.   Social Worker assessment / plan:  CSW spoke with patient's spouse concerning return to SNF.   Employment status:  Retired Nurse, adult PT Recommendations:  Not assessed at this time Information / Referral to community resources:  [place]  Patient/Family's Response to care:  Patient's spouse reported understanding of discharge plan and requests PTAR at discharge.   Patient/Family's Understanding of and Emotional Response to Diagnosis, Current Treatment, and Prognosis:  Patient/family is realistic regarding therapy needs and expressed being hopeful for return to SNF placement. Patient's spouse expressed understanding of CSW role and discharge process as  well as medical condition. No questions/concerns about plan or treatment.    Emotional Assessment Appearance:  Appears stated age Attitude/Demeanor/Rapport:  Unable to Assess Affect (typically observed):  Unable to Assess Orientation:  Oriented to Self, Oriented to Place Alcohol / Substance use:  Not Applicable Psych involvement (Current and /or in the community):  No (Comment)  Discharge Needs  Concerns to be addressed:  Care Coordination Readmission within the last 30 days:  No Current discharge risk:  None Barriers to Discharge:  Continued Medical Work up   [employer], [place] 06/25/2017, 2:35 PM

## 2017-06-25 NOTE — Progress Notes (Addendum)
PROGRESS NOTE                                                                                                                                                                                                             Patient Demographics:    Grant Lara, is a 77 y.o. male, DOB - [birth year], QPR:[id number]  Admit date - 06/22/2017   Admitting Physician Lady Deutscher, MD  Outpatient Primary MD for the patient is Gildardo Cranker, DO  LOS - 3  Outpatient Specialists:  Chief Complaint  Patient presents with  . Fall       Brief Narrative   77 year old male with a history of severe cardiomyopathy, PE on Xarelto, diabetes mellitus, stage III chronic kidney disease and mild dementia presented from SNF after a fall with orthostatic syncope.  He was found to have hemoglobin of 6.1 (hemoglobin of 8.52 months back).  CT of the cervical spine on admission showing C5 superior endplate vertebral body fracture. Neurosurgery was consulted by ED physician who recommended soft collar and office follow-up in 2-3 weeks.  Patient transfused with 2 unit PRBC and GI consulted.    Subjective:   Patient denies any dizziness, neck pain.  H&H remains stable.  Assessment  & Plan :    Principal Problem:   Acute on chronic blood loss anemia (HCC) Has received total 4 unit PRBC with hemoglobin of 11 today.  GI consult appreciated.  EGD held off due to cervical fracture and presence of collar. I spoke with neurosurgery Dr. Sherwood Gambler today who recommended (after reviewing his CT) that  EGD can be done if urgently indicated.  Otherwise patient can follow-up with Dr. Ronnald Ramp (who was initially consulted by ED physician) in 2-3 weeks and can have EGD as outpatient once he clears him. Upper GI series done today without findings of ulcer.  Spoke with GI who recommends resuming his anticoagulation.  Will monitor 24 hours on anticoagulation for stable H&H and  possibly discharge him back to SNF with outpatient follow-up with Dr. Cristina Gong in about 4 weeks.   Active Problems: C5 vertebral fracture On soft collar which he should continue upon discharge and follow-up with neurosurgery Dr. Ronnald Ramp in 2-3 weeks.    Hypertensive heart disease with CHF (congestive heart failure) ([place]) Has ICD in place. Euvolemic.  Received Lasix intermittently with transfusion.  Not on any other home medications.    COPD (chronic obstructive pulmonary disease) (HCC) Stable.  Continue as needed nebs.  Vascular dementia without behavioral disturbance Mild.  Continue Aricept.     DVT (deep venous thrombosis) (HCC) Pulmonary embolism. Xarelto held on admission for acute blood loss anemia.  H&H currently stable and being resumed today.  Monitor H&H in a.m.     CKD (chronic kidney disease) stage 3, GFR 30-59 ml/min (HCC) Stable at baseline.     Protein-calorie malnutrition, severe (HCC) Continue Ensure.  Diabetes mellitus type 2 Stable on sliding scale coverage.     Code Status : DNR  Family Communication  : None at bedside  Disposition Plan  : SNF possibly tomorrow if H&H stable after resuming anticoagulation.  Barriers For Discharge : Improving symptoms  Consults  : [facility] Discussed with neurosurgery (Dr. Ronnald Ramp and Dr. Sherwood Gambler on the phone)  Procedures  : CT head and cervical spine Upper GI series  DVT Prophylaxis  : Xarelto  Lab Results  Component Value Date   PLT 322 06/25/2017    Antibiotics  :   Anti-infectives (From admission, onward)   None        Objective:   Vitals:   06/24/17 0639 06/24/17 1424 06/24/17 2307 06/25/17 0617  BP:  (!) 95/57 122/71 121/70  Pulse:  97 89 60  Resp:  14 18 17   Temp:  98.4 F (36.9 C) (!) 97.2 F (36.2 C) 98.8 F (37.1 C)  TempSrc:  Oral Oral Oral  SpO2:   99% 98%  Weight: 51.5 kg (113 lb 8.6 oz)   49.5 kg (109 lb 2 oz)  Height:        Wt Readings from Last 3 Encounters:  06/25/17  49.5 kg (109 lb 2 oz)  06/19/17 49.2 kg (108 lb 8 oz)  04/28/17 55.2 kg (121 lb 12.8 oz)     Intake/Output Summary (Last 24 hours) at 06/25/2017 1411 Last data filed at 06/24/2017 1718 Gross per 24 hour  Intake 881.25 ml  Output 600 ml  Net 281.25 ml     Physical Exam  Gen: Elderly thin built male not in distress HEENT: Cervical collar in place, pallor present, temporal wasting, moist mucosa, supple neck Chest: clear b/l, no added sounds CVS: N S1&S2, no murmurs,  GI: soft, NT, ND Musculoskeletal: warm, no edema     Data Review:    CBC Recent Labs  Lab 06/22/17 0825 06/22/17 1430 06/23/17 0621 06/23/17 1229 06/24/17 0503 06/25/17 0439  WBC 10.1  --   --   --  10.9* 11.2*  HGB 6.1* 7.3* 8.0* 7.6* 10.8* 11.4*  HCT 21.4* 25.2* 26.4* 25.5* 33.4* 37.8*  PLT 381  --   --   --  298 322  MCV 74.0*  --   --   --  80.1 82.5  MCH 21.1*  --   --   --  25.9* 24.9*  MCHC 28.5*  --   --   --  32.3 30.2  RDW 20.5*  --   --   --  19.7* 20.6*  LYMPHSABS 1.5  --   --   --   --   --   MONOABS 0.5  --   --   --   --   --   EOSABS 0.1  --   --   --   --   --   BASOSABS 0.0  --   --   --   --   --  Chemistries  Recent Labs  Lab 06/22/17 0707 06/22/17 0825 06/22/17 1430 06/23/17 0621 06/24/17 0503 06/25/17 0439  NA 140 139  --  136 140 139  K 4.7 4.8  --  4.1 4.2 4.3  CL 104 105  --  102 103 104  CO2  --  23  --  24 22 27   GLUCOSE 110* 104*  --  87 72 57*  BUN 18 14  --  14 11 15   CREATININE 1.10 1.17  --  1.08 1.15 1.07  CALCIUM  --  8.8*  --  8.4* 8.6* 8.9  MG  --   --  1.8  --   --   --    ------------------------------------------------------------------------------------------------------------------ No results for input(s): CHOL, HDL, LDLCALC, TRIG, CHOLHDL, LDLDIRECT in the last 72 hours.  Lab Results  Component Value Date   HGBA1C 6.4 07/31/2016    ------------------------------------------------------------------------------------------------------------------ No results for input(s): TSH, T4TOTAL, T3FREE, THYROIDAB in the last 72 hours.  Invalid input(s): FREET3 ------------------------------------------------------------------------------------------------------------------ No results for input(s): VITAMINB12, FOLATE, FERRITIN, TIBC, IRON, RETICCTPCT in the last 72 hours.  Coagulation profile No results for input(s): INR, PROTIME in the last 168 hours.  No results for input(s): DDIMER in the last 72 hours.  Cardiac Enzymes No results for input(s): CKMB, TROPONINI, MYOGLOBIN in the last 168 hours.  Invalid input(s): CK ------------------------------------------------------------------------------------------------------------------    Component Value Date/Time   BNP 77.1 10/05/2015 2257    Inpatient Medications  Scheduled Meds: . donepezil  10 mg Oral QHS  . gabapentin  100 mg Oral QHS  . insulin aspart  0-9 Units Subcutaneous TID WC  . latanoprost  1 drop Both Eyes QHS  . levETIRAcetam  500 mg Oral BID  . mirtazapine  7.5 mg Oral QHS  . sertraline  75 mg Oral Daily  . sodium chloride flush  3 mL Intravenous Q12H  . tamsulosin  0.4 mg Oral QHS  . traMADol  50 mg Oral TID   Continuous Infusions: PRN Meds:.acetaminophen **OR** acetaminophen, ipratropium-albuterol, LORazepam, ondansetron **OR** ondansetron (ZOFRAN) IV, oxyCODONE  Micro Results No results found for this or any previous visit (from the past 240 hour(s)).  Radiology Reports Dg Pelvis 1-2 Views  Result Date: 06/22/2017 CLINICAL DATA:  Status post fall, with concern for pelvic injury. Initial encounter. EXAM: PELVIS - 1-2 VIEW COMPARISON:  CT of the chest, abdomen and pelvis from 04/18/2014 FINDINGS: There is no evidence of fracture or dislocation. Both femoral heads are seated normally within their respective acetabula. No significant degenerative  change is appreciated. The sacroiliac joints are unremarkable in appearance. The visualized bowel gas pattern is grossly unremarkable in appearance. IMPRESSION: No evidence of fracture or dislocation. Electronically Signed   By: Garald Balding M.D.   On: 06/22/2017 06:37   Ct Head Wo Contrast  Result Date: 06/22/2017 CLINICAL DATA:  77 y/o M; fall. History of dementia on blood thinners. EXAM: CT HEAD WITHOUT CONTRAST CT CERVICAL SPINE WITHOUT CONTRAST TECHNIQUE: Multidetector CT imaging of the head and cervical spine was performed following the standard protocol without intravenous contrast. Multiplanar CT image reconstructions of the cervical spine were also generated. COMPARISON:  04/24/2017 CT head and cervical spine. FINDINGS: CT HEAD FINDINGS Brain: No evidence of acute infarction, hemorrhage, hydrocephalus, extra-axial collection or mass lesion/mass effect. 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 Stable chronic microvascular ischemic changes and parenchymal volume loss of the brain. Stable small chronic lacunar infarcts within bilateral basal ganglia. Vascular: Calcific atherosclerosis  of carotid siphons. Skull: Frontal scalp laceration.  No calvarial fracture. Sinuses/Orbits: No acute finding. Other: None. CT CERVICAL SPINE FINDINGS Alignment: Normal. Skull base and vertebrae: C5 vertebral body anterior superior endplate acute minimally displaced limbus fracture and fracture of right transverse process (series 10, image 29 and series 11, image 13). No other fracture or dislocation identified. Soft tissues and spinal canal: No prevertebral fluid or swelling. No visible canal hematoma. Disc levels: Moderate cervical spondylosis with multilevel disc and facet degenerative changes. No high-grade bony canal stenosis. Uncovertebral and facet hypertrophy encroaches on the bony neural foramina on the left side at C3-C7 and on the right  side at C3-4. Upper chest: Negative. Other: Negative. IMPRESSION: CT head: 1. Frontal scalp laceration.  No calvarial fracture. 2. No acute intracranial abnormality. 3. Stable chronic infarctions, microvascular ischemic changes, and parenchymal volume loss of the brain. CT cervical spine: C5 vertebral body anterior superior endplate acute minimally displaced fracture and fracture of right transverse process. No malalignment. No other fracture identified. These results were called by telephone at the time of interpretation on 06/22/2017 at 6:46 am to Dr. Deno Etienne , who verbally acknowledged these results. Electronically Signed   By: Kristine Garbe M.D.   On: 06/22/2017 06:48   Ct Cervical Spine Wo Contrast  Result Date: 06/22/2017 CLINICAL DATA:  77 y/o M; fall. History of dementia on blood thinners. EXAM: CT HEAD WITHOUT CONTRAST CT CERVICAL SPINE WITHOUT CONTRAST TECHNIQUE: Multidetector CT imaging of the head and cervical spine was performed following the standard protocol without intravenous contrast. Multiplanar CT image reconstructions of the cervical spine were also generated. COMPARISON:  04/24/2017 CT head and cervical spine. FINDINGS: CT HEAD FINDINGS Brain: No evidence of acute infarction, hemorrhage, hydrocephalus, extra-axial collection or mass lesion/mass effect. 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 Stable chronic microvascular ischemic changes and parenchymal volume loss of the brain. Stable small chronic lacunar infarcts within bilateral basal ganglia. Vascular: Calcific atherosclerosis of carotid siphons. Skull: Frontal scalp laceration.  No calvarial fracture. Sinuses/Orbits: No acute finding. Other: None. CT CERVICAL SPINE FINDINGS Alignment: Normal. Skull base and vertebrae: C5 vertebral body anterior superior endplate acute minimally displaced limbus fracture and fracture of right transverse process  (series 10, image 29 and series 11, image 13). No other fracture or dislocation identified. Soft tissues and spinal canal: No prevertebral fluid or swelling. No visible canal hematoma. Disc levels: Moderate cervical spondylosis with multilevel disc and facet degenerative changes. No high-grade bony canal stenosis. Uncovertebral and facet hypertrophy encroaches on the bony neural foramina on the left side at C3-C7 and on the right side at C3-4. Upper chest: Negative. Other: Negative. IMPRESSION: CT head: 1. Frontal scalp laceration.  No calvarial fracture. 2. No acute intracranial abnormality. 3. Stable chronic infarctions, microvascular ischemic changes, and parenchymal volume loss of the brain. CT cervical spine: C5 vertebral body anterior superior endplate acute minimally displaced fracture and fracture of right transverse process. No malalignment. No other fracture identified. These results were called by telephone at the time of interpretation on 06/22/2017 at 6:46 am to Dr. Deno Etienne , who verbally acknowledged these results. Electronically Signed   By: Kristine Garbe M.D.   On: 06/22/2017 06:48   Dg Duanne Limerick  W/kub  Result Date: 06/25/2017 CLINICAL DATA:  77 year old male with dementia and anemia. EXAM: UPPER GI SERIES WITH KUB TECHNIQUE: After obtaining a scout radiograph a routine upper GI series was performed using thin barium FLUOROSCOPY TIME:  Fluoroscopy Time:  2 minutes 36 seconds Radiation Exposure Index (if provided by the fluoroscopic device): Number of Acquired Spot Images: 0 COMPARISON:  KUB 09/08/2015.  CTA abdomen and Pelvis 11/05/2011. FINDINGS: Left chest cardiac pacemaker in place. Scout view of the abdomen demonstrates a nonobstructed bowel-gas pattern. Abdominal visceral contours appear within normal limits. No acute osseous abnormality identified. A single contrast study was undertaken and the patient tolerated this well and without difficulty. No obstruction to the forward flow of  contrast throughout the esophagus and into the stomach. Normal esophageal course and contour. Normal gastroesophageal junction. Presbyesophagus in the form of generalized decreased esophageal motility was observed. Prompt gastric emptying.  Normal duodenum C-loop configuration. Spite single contrast technique, the gastric mucosal pattern is well demonstrated. Normal gastric contour and rugal folds. No gastric abnormality identified. The duodenum mucosal pattern and contour likewise appear normal. The gastric antrum and duodenum bulb both appear normal. No gastroesophageal reflux occurred spontaneously or was elicited. At the conclusion of the study, multiple normal appearing small bowel loops were opacified to the level of the mid jejunum. IMPRESSION: Presbyesophagus, but otherwise normal upper GI. Electronically Signed   By: Genevie Ann M.D.   On: 06/25/2017 08:50    Time Spent in minutes  25   Naser Schuld M.D on 06/25/2017 at 2:11 PM  Between 7am to 7pm - Pager - [phone]  After 7pm go to [email or web address] - password [facility] [place]  [facility] -  Office  [phone]

## 2017-06-25 NOTE — Progress Notes (Signed)
[facility] Gastroenterology Progress Note  Grant Lara 77 y.o. 10-10-1940  CC: Anemia, occult blood positive stool   Subjective:  Active GI issues overnight. Discussed with the nursing staff. No evidence of bleeding. Patient denied abdominal pain.   Objective: Vital signs in last 24 hours: Vitals:   06/24/17 2307 06/25/17 0617  BP: 122/71 121/70  Pulse: 89 60  Resp: 18 17  Temp: (!) 97.2 F (36.2 C) 98.8 F (37.1 C)  SpO2: 99% 98%    Physical Exam: Gen. Alert but somewhat confused. Neck : Soft collar in place. Abdomen. Soft, nontender, nondistended, bowel sounds present. Scar marks from previous surgery noted. Lower extremity. No significant edema  Lab Results: Recent Labs    06/22/17 1430  06/24/17 0503 06/25/17 0439  NA  --    < > 140 139  K  --    < > 4.2 4.3  CL  --    < > 103 104  CO2  --    < > 22 27  GLUCOSE  --    < > 72 57*  BUN  --    < > 11 15  CREATININE  --    < > 1.15 1.07  CALCIUM  --    < > 8.6* 8.9  MG 1.8  --   --   --    < > = values in this interval not displayed.   No results for input(s): AST, ALT, ALKPHOS, BILITOT, PROT, ALBUMIN in the last 72 hours. Recent Labs    06/24/17 0503 06/25/17 0439  WBC 10.9* 11.2*  HGB 10.8* 11.4*  HCT 33.4* 37.8*  MCV 80.1 82.5  PLT 298 322   No results for input(s): LABPROT, INR in the last 72 hours.    Assessment/Plan: - Symptomatic anemia with occult blood positive stool. Hemoglobin 6.1 on admission. Patient denied any overt bleeding. - Status post fall with the C5 vertebral body endplate fracture. Currently has a soft collar - History of  DVT/PE . Xarelto on hold./  - History of coronary artery disease   Recommendations --------------------------- -  upper GI series negative for ulcer disease. - Hemoglobin continues to improve. No evidence of overt bleeding. - Okay to resume anticoagulation from GI standpoint. - Patient with the current C5 vertebral fracture. I am not sure it would be safe to  do an endoscopy which will require manipulation of his neck.   - Follow-up with Dr. Cristina Gong  in 6-8 weeks after discharge  - Recommend outpatient barium anemia or virtual colonoscopy if clinically indicated , once acute issues are resolved.  - GI will sign off. Call us back if needed.   Otis Brace MD, FACP 06/25/2017, 1:42 PM  Contact #  [phone]

## 2017-06-26 DIAGNOSIS — S12401A Unspecified nondisplaced fracture of fifth cervical vertebra, initial encounter for closed fracture: Secondary | ICD-10-CM

## 2017-06-26 DIAGNOSIS — I110 Hypertensive heart disease with heart failure: Secondary | ICD-10-CM

## 2017-06-26 DIAGNOSIS — F0150 Vascular dementia without behavioral disturbance: Secondary | ICD-10-CM

## 2017-06-26 DIAGNOSIS — E43 Unspecified severe protein-calorie malnutrition: Secondary | ICD-10-CM

## 2017-06-26 LAB — TYPE AND SCREEN
ABO/RH(D): B POS
ANTIBODY SCREEN: NEGATIVE
UNIT DIVISION: 0
UNIT DIVISION: 0
UNIT DIVISION: 0
Unit division: 0
Unit division: 0
Unit division: 0

## 2017-06-26 LAB — BPAM RBC
BLOOD PRODUCT EXPIRATION DATE: 201904042359
BLOOD PRODUCT EXPIRATION DATE: 201904042359
Blood Product Expiration Date: 201903192359
Blood Product Expiration Date: 201904042359
Blood Product Expiration Date: 201904042359
Blood Product Expiration Date: 201904042359
ISSUE DATE / TIME: 201903101024
ISSUE DATE / TIME: 201903101555
ISSUE DATE / TIME: 201903111529
ISSUE DATE / TIME: 201903112108
UNIT TYPE AND RH: 7300
UNIT TYPE AND RH: 7300
UNIT TYPE AND RH: 7300
UNIT TYPE AND RH: 7300
Unit Type and Rh: 7300
Unit Type and Rh: 7300

## 2017-06-26 LAB — GLUCOSE, CAPILLARY
Glucose-Capillary: 82 mg/dL (ref 65–99)
Glucose-Capillary: 135 mg/dL — ABNORMAL HIGH (ref 65–99)

## 2017-06-26 LAB — HEMOGLOBIN AND HEMATOCRIT, BLOOD
HCT: 33.8 % — ABNORMAL LOW (ref 39.0–52.0)
HEMOGLOBIN: 10.3 g/dL — AB (ref 13.0–17.0)

## 2017-06-26 MED ORDER — TRAMADOL HCL 50 MG PO TABS: 50.0000 mg | ORAL_TABLET | Freq: Three times a day (TID) | ORAL | 0 refills | Status: DC

## 2017-06-26 NOTE — Progress Notes (Signed)
Report called to Raymond at facility.

## 2017-06-26 NOTE — Care Management Important Message (Addendum)
Important Message  Patient Details  Name: URI TURNBOUGH MRN: [id number] Date of Birth: 1940/06/29   Medicare Important Message Given:  Yes  Correction due to illness patient did not asign unsign copy left.  Altovise Wahler 06/26/2017, 12:36 PM

## 2017-06-26 NOTE — Discharge Summary (Addendum)
Physician Discharge Summary  Grant Lara KDX:[id number] DOB: 1940-09-10 DOA: 06/22/2017  PCP: Gildardo Cranker, DO  Admit date: 06/22/2017 Discharge date: 06/26/2017  Admitted From: Skilled nursing facility Disposition: Skilled nursing facility  Recommendations for Outpatient Follow-up:  1. Follow up with you at SNF within 1 week.  Please check H&H within next 2-3 days.  Resume baby aspirin if H&H remained stable in the next 2 weeks. 2. Follow-up with neurosurgery Dr. Ronnald Ramp in 2 weeks (office will call).  Patient needs to be on the soft cervical collar until he is seen by the neurosurgeon. 3. 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   ICD (implantable cardioverter-defibrillator) in place   DM type 2 causing CKD stage 3 (HCC)   Weight loss, non-intentional   Pressure injury of skin  Brief narrative/history of presenting illness Please refer to admission H&P for details, in brief,77 year old male with a history of severe cardiomyopathy, PE on Xarelto, diabetes mellitus, stage III chronic kidney disease and mild dementia presented from SNF after a fall with orthostatic syncope.  He was found to have  hemoglobin of 6.1 (hemoglobin of 8.52 months back).  CT of the cervical spine on admission showing C5 superior endplate vertebral body fracture. Neurosurgery was consulted by ED physician who recommended soft collar and office follow-up in 2-3 weeks.  Patient transfused with 2 unit PRBC and GI consulted.   Hospital course  Principal Problem:   Acute on chronic blood loss anemia (HCC) Has received total 4 unit PRBC with hemoglobin of 11 today.  GI consult appreciated.  EGD held off due to cervical fracture and presence of collar. I spoke with neurosurgery Dr. Sherwood Gambler today who recommended (after reviewing his CT) that  EGD can be done if urgently indicated.  Otherwise patient can follow-up with Dr. Ronnald Ramp (who was initially consulted by ED physician) in 2-3 weeks and can have EGD as outpatient once he clears him. Upper GI series done  without findings of ulcer.  Spoke with GI who recommends resuming his anticoagulation.   H&H stable after starting anticoagulation yesterday.  Patient should follow-up with Dr Cristina Gong [facility]) in about 6-8 weeks for outpatient barium enema or virtual colonoscopy if indicated.   Active Problems:  C5 vertebral fracture On soft collar which he should continue upon discharge and follow-up with neurosurgery Dr. Ronnald Ramp in 2-3 weeks. (Office will call with an appointment ).  Patient should maintain on the soft collar until he seen by neurosurgeon. Has not required further pain medications except for scheduled tramadol which he has been receiving at the facility.      Hypertensive heart disease with CHF (congestive heart failure) ([place]) Has ICD in place.  EF of 30-35%.  Euvolemic.  Received Lasix  intermittently with transfusion.  Not on any other home medications.  (?  Likely due to drop in blood pressure). Aspirin held on discharge.  May resume in 2 weeks if H&H remains stable.     COPD (chronic obstructive pulmonary disease) (HCC) Stable.  Continue as needed  nebs.  Vascular dementia without behavioral disturbance Mild.  Continue Aricept.     DVT (deep venous thrombosis) (HCC) Pulmonary embolism. Xarelto held on admission for acute blood loss anemia.  H&H currently stable and being resumed today.    Monitor H&H closely at the facility.     CKD (chronic kidney disease) stage 3, GFR 30-59 ml/min (HCC) Stable at baseline.     Protein-calorie malnutrition, severe (HCC) Continue Ensure.  Diabetes mellitus type 2 Stable on sliding scale coverage.     Family Communication  : None at bedside  Disposition Plan  : SNF   Consults  : [facility] Discussed with neurosurgery (Dr. Ronnald Ramp and Dr. Sherwood Gambler on the phone)  Procedures  : CT head and cervical spine Upper GI series      Discharge Instructions   Allergies as of 06/26/2017      Reactions   Tuberculin Tests Other (See Comments)   Per MAR (no description provided)      Medication List    STOP taking these medications   aspirin 81 MG tablet        TAKE these medications   donepezil 10 MG tablet Commonly known as:  ARICEPT Take 10 mg by mouth at bedtime.   gabapentin 100 MG capsule Commonly known as:  NEURONTIN Take 100 mg by mouth at bedtime.   ipratropium-albuterol 0.5-2.5 (3) MG/3ML Soln Commonly known as:  DUONEB Take 3 mLs by nebulization every 6 (six) hours as needed (for breathing).   latanoprost 0.005 % ophthalmic solution Commonly known as:  XALATAN Place 1 drop into both eyes at bedtime.   levETIRAcetam 500 MG tablet Commonly known as:  KEPPRA Take 1 tablet (500 mg total) by mouth 2 (two) times daily.   loperamide 2 MG tablet Commonly known as:  IMODIUM A-D Take 2 mg by mouth every 4 (four) hours as needed for diarrhea or loose stools. X 14 days   mirtazapine 7.5 MG tablet Commonly known as:  REMERON Take 7.5 mg by mouth at bedtime.  rivaroxaban 20 MG Tabs tablet Commonly known as:  XARELTO Take 20 mg by mouth daily n the  evening    senna 8.6 MG Tabs tablet Commonly known as:  SENOKOT Take 1 tablet by mouth every morning.   sertraline 50 MG tablet Commonly known as:  ZOLOFT Take 75 mg by mouth daily.   tamsulosin 0.4 MG Caps capsule Commonly known as:  FLOMAX Take 0.4 mg by mouth at bedtime.   traMADol 50 MG tablet Commonly known as:  ULTRAM Take 1 tablet (50 mg total) by mouth 3 (three) times daily. Hold for sedation      Follow-up Information    Eustace Moore, MD. Schedule an appointment as soon as possible for a visit in 2 week(s).   Specialty:  Neurosurgery Why:  OFFICE WILL CALL Contact information: [address] [place] [address] [place] [address] [phone]        Ronald Lobo, MD. Schedule an appointment as soon as possible for a visit in 2 month(s).   Specialty:  Gastroenterology Contact information: [address] [place] [address] [phone]          Allergies  Allergen Reactions  . Tuberculin Tests Other (See Comments)    Per MAR (no description provided)      Procedures/Studies: Dg Pelvis 1-2 Views  Result Date: 06/22/2017 CLINICAL DATA:  Status post fall, with concern for pelvic injury. Initial encounter. EXAM: PELVIS - 1-2 VIEW COMPARISON:  CT of the chest, abdomen and pelvis from 04/18/2014 FINDINGS: There is no evidence of fracture or dislocation. Both femoral heads are seated normally within their respective acetabula. No significant degenerative change is appreciated. The sacroiliac joints are unremarkable in appearance. The visualized bowel gas pattern is grossly unremarkable in appearance. IMPRESSION: No evidence of fracture or dislocation. Electronically Signed   By: Garald Balding M.D.   On: 06/22/2017 06:37   Ct Head Wo Contrast  Result Date: 06/22/2017 CLINICAL DATA:  77 y/o M; fall. History of dementia on blood thinners. EXAM: CT HEAD WITHOUT CONTRAST CT CERVICAL SPINE WITHOUT CONTRAST TECHNIQUE: Multidetector CT imaging  of the head and cervical spine was performed following the standard protocol without intravenous contrast. Multiplanar CT image reconstructions of the cervical spine were also generated. COMPARISON:  04/24/2017 CT head and cervical spine. FINDINGS: CT HEAD FINDINGS Brain: No evidence of acute infarction, hemorrhage, hydrocephalus, extra-axial collection or mass lesion/mass effect. 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 Stable chronic microvascular ischemic changes and parenchymal volume loss of the brain. Stable small chronic lacunar infarcts within bilateral basal ganglia. Vascular: Calcific atherosclerosis of carotid siphons. Skull: Frontal scalp laceration.  No calvarial fracture. Sinuses/Orbits: No acute finding. Other: None. CT CERVICAL SPINE FINDINGS Alignment: Normal. Skull base and vertebrae: C5 vertebral body anterior superior endplate acute minimally displaced limbus fracture and fracture of right transverse process (series 10, image 29 and series 11, image 13). No other fracture or dislocation identified. Soft tissues and spinal canal: No prevertebral fluid or swelling. No visible canal hematoma. Disc levels: Moderate cervical spondylosis with multilevel disc and facet degenerative changes. No high-grade bony canal stenosis. Uncovertebral and facet hypertrophy encroaches on the bony neural foramina on the left side at C3-C7 and on the right side at C3-4. Upper chest: Negative. Other: Negative. IMPRESSION: CT head: 1. Frontal scalp laceration.  No calvarial fracture. 2. No acute intracranial abnormality. 3. Stable chronic infarctions, microvascular ischemic changes, and parenchymal volume loss of the brain. CT cervical spine: C5 vertebral body anterior superior endplate acute minimally displaced fracture and fracture of right transverse process. No malalignment. No other fracture identified. These results were called by telephone  at the time of interpretation on 06/22/2017 at 6:46 am to Dr. Deno Etienne , who verbally acknowledged these results. Electronically Signed   By: Kristine Garbe M.D.   On: 06/22/2017 06:48   Ct Cervical Spine Wo Contrast  Result Date: 06/22/2017 CLINICAL DATA:  77 y/o M; fall. History of dementia on blood thinners. EXAM: CT HEAD WITHOUT CONTRAST CT CERVICAL SPINE WITHOUT CONTRAST TECHNIQUE: Multidetector CT imaging of the head and cervical spine was performed following the standard protocol without intravenous contrast. Multiplanar CT image reconstructions of the cervical spine were also generated. COMPARISON:  04/24/2017 CT head and cervical spine. FINDINGS: CT HEAD FINDINGS Brain: No evidence of acute infarction, hemorrhage, hydrocephalus, extra-axial collection or mass lesion/mass effect. 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 Stable chronic microvascular ischemic changes and parenchymal volume loss of the brain. Stable small chronic lacunar infarcts within bilateral basal ganglia. Vascular: Calcific atherosclerosis of carotid siphons. Skull: Frontal scalp laceration.  No calvarial fracture. Sinuses/Orbits: No acute finding. Other: None. CT CERVICAL SPINE FINDINGS Alignment: Normal. Skull base and vertebrae: C5 vertebral body anterior superior endplate acute minimally displaced limbus fracture and fracture of right transverse process (series 10, image 29 and series 11, image 13). No other fracture or dislocation identified. Soft tissues and spinal canal: No prevertebral fluid or swelling. No visible canal hematoma. Disc levels: Moderate cervical spondylosis with multilevel disc and facet degenerative changes. No high-grade bony canal stenosis. Uncovertebral and facet hypertrophy encroaches on the bony neural foramina on the left side at C3-C7 and on the right side at C3-4. Upper chest: Negative. Other: Negative. IMPRESSION: CT  head: 1. Frontal scalp laceration.  No calvarial fracture. 2. No acute intracranial abnormality. 3. Stable chronic infarctions, microvascular ischemic changes, and parenchymal volume loss of the brain. CT cervical spine: C5 vertebral body anterior superior endplate acute minimally displaced fracture and fracture of right transverse process. No malalignment. No other fracture identified. These results were called by telephone at the time of interpretation on 06/22/2017 at 6:46 am to Dr. Deno Etienne , who verbally acknowledged these results. Electronically Signed   By: Kristine Garbe M.D.   On: 06/22/2017 06:48   Dg Duanne Limerick  W/kub  Result Date: 06/25/2017 CLINICAL DATA:  77 year old male with dementia and anemia. EXAM: UPPER GI SERIES WITH KUB TECHNIQUE: After obtaining a scout radiograph a routine upper GI series was performed using thin barium FLUOROSCOPY TIME:  Fluoroscopy Time:  2 minutes 36 seconds Radiation Exposure Index (if provided by the fluoroscopic device): Number of Acquired Spot Images: 0 COMPARISON:  KUB 09/08/2015.  CTA abdomen and Pelvis 11/05/2011. FINDINGS: Left chest cardiac pacemaker in place. Scout view of the abdomen demonstrates a nonobstructed bowel-gas pattern. Abdominal visceral contours appear within normal limits. No acute osseous abnormality identified. A single contrast study was undertaken and the patient tolerated this well and without difficulty. No obstruction to the forward flow of contrast throughout the esophagus and into the stomach. Normal esophageal course and contour. Normal gastroesophageal junction. Presbyesophagus in the form of generalized decreased esophageal motility was observed. Prompt gastric emptying.  Normal duodenum C-loop configuration. Spite single contrast technique, the gastric mucosal pattern is well demonstrated. Normal gastric contour and rugal folds. No gastric abnormality identified. The duodenum mucosal pattern and contour likewise appear normal.  The gastric antrum and duodenum bulb both appear normal. No gastroesophageal reflux occurred spontaneously or was elicited. At the conclusion of the study, multiple normal appearing small bowel loops were opacified to the level of the mid jejunum. IMPRESSION: Presbyesophagus, but otherwise normal upper GI. Electronically Signed   By: Genevie Ann M.D.   On: 06/25/2017 08:50       Subjective: No overnight events.  Denies any pain.  Discharge Exam: Vitals:   06/25/17 2230 06/26/17 0440  BP: 100/60 112/66  Pulse: 68 70  Resp: 17 17  Temp: 98.5 F (36.9 C)   SpO2: 100% 99%   Vitals:   06/25/17 0617 06/25/17 1446 06/25/17 2230 06/26/17 0440  BP: 121/70 106/65 100/60 112/66  Pulse: 60 89 68 70  Resp: 17 17 17 17   Temp: 98.8 F (37.1 C) 97.6 F (36.4 C) 98.5 F (36.9 C)   TempSrc: Oral Oral Oral   SpO2: 98% 100% 100% 99%  Weight: 49.5 kg (109 lb 2 oz)   50.2 kg (110 lb 10.7 oz)  Height:         Gen: Elderly thin built male not in distress HEENT:  Sutures  over the forehead, cervical collar in place, pallor present, temporal wasting, moist mucosa, supple neck Chest: clear b/l, no added sounds CVS: N S1&S2, no murmurs,  GI: soft, NT, ND Musculoskeletal: warm, no edema CNS: Alert and oriented x1-2.  (Baseline dementia)     The results of significant diagnostics from this hospitalization (including imaging, microbiology, ancillary and laboratory) are listed below for reference.     Microbiology: Recent Results (from the past 240 hour(s))  MRSA PCR Screening     Status: None   Collection Time: 06/25/17  2:52 PM  Result Value Ref Range Status   MRSA by PCR NEGATIVE NEGATIVE Final    Comment:        The GeneXpert MRSA Assay (FDA approved for NASAL specimens only), is one component of a comprehensive MRSA colonization surveillance program. It is not intended to diagnose MRSA infection nor to guide or monitor treatment for MRSA infections. Performed at [facility], [place] [address] [place], [place] [address]      Labs: BNP (last 3 results) No results for input(s): BNP in the last 8760 hours. Basic Metabolic Panel: Recent Labs  Lab 06/22/17 0707 06/22/17 0825 06/22/17 1430 06/23/17 0621 06/24/17 0503 06/25/17 0439  NA 140 139  --  136 140 139  K 4.7 4.8  --  4.1 4.2 4.3  CL 104 105  --  102 103 104  CO2  --  23  --  24 22 27   GLUCOSE 110* 104*  --  87 72 57*  BUN 18 14  --  14 11 15   CREATININE 1.10 1.17  --  1.08 1.15 1.07  CALCIUM  --  8.8*  --  8.4* 8.6* 8.9  MG  --   --  1.8  --   --   --    Liver Function Tests: No results for input(s): AST, ALT, ALKPHOS, BILITOT, PROT, ALBUMIN in the last 168 hours. No results for input(s): LIPASE, AMYLASE in the last 168 hours. No results for input(s): AMMONIA in the last 168 hours. CBC: Recent Labs  Lab 06/22/17 0825  06/23/17 0621 06/23/17 1229 06/24/17 0503 06/25/17 0439 06/26/17 0307  WBC 10.1  --   --   --  10.9* 11.2*  --   NEUTROABS 8.0*  --   --   --   --   --   --   HGB 6.1*   < > 8.0* 7.6* 10.8* 11.4* 10.3*  HCT 21.4*   < > 26.4* 25.5* 33.4* 37.8* 33.8*  MCV 74.0*  --   --   --  80.1 82.5  --   PLT 381  --   --   --  298 322  --    < > = values in this interval not displayed.   Cardiac Enzymes: No results for input(s): CKTOTAL, CKMB, CKMBINDEX, TROPONINI in the last 168 hours. BNP: Invalid input(s): POCBNP CBG: Recent Labs  Lab 06/25/17 0753 06/25/17 1215 06/25/17 1705 06/25/17 2227 06/26/17 0744  GLUCAP 65 98 130* 99 82   D-Dimer No results for input(s): DDIMER in the last 72 hours. Hgb A1c No results for input(s): HGBA1C in the last 72 hours. Lipid Profile No results for input(s): CHOL, HDL, LDLCALC, TRIG, CHOLHDL, LDLDIRECT in the last 72 hours. Thyroid function studies No results for input(s): TSH, T4TOTAL, T3FREE, THYROIDAB in the last 72 hours.  Invalid input(s): FREET3 Anemia work up No results for input(s): VITAMINB12, FOLATE, FERRITIN, TIBC, IRON,  RETICCTPCT in the  last 72 hours. Urinalysis    Component Value Date/Time   COLORURINE YELLOW 04/24/2017 0424   APPEARANCEUR CLOUDY (A) 04/24/2017 0424   LABSPEC >1.030 (H) 04/24/2017 0424   PHURINE 5.0 04/24/2017 0424   GLUCOSEU NEGATIVE 04/24/2017 0424   HGBUR SMALL (A) 04/24/2017 0424   BILIRUBINUR SMALL (A) 04/24/2017 0424   KETONESUR 15 (A) 04/24/2017 0424   PROTEINUR NEGATIVE 04/24/2017 0424   UROBILINOGEN 1.0 12/21/2014 1520   NITRITE NEGATIVE 04/24/2017 0424   LEUKOCYTESUR TRACE (A) 04/24/2017 0424   Sepsis Labs Invalid input(s): PROCALCITONIN,  WBC,  LACTICIDVEN Microbiology Recent Results (from the past 240 hour(s))  MRSA PCR Screening     Status: None   Collection Time: 06/25/17  2:52 PM  Result Value Ref Range Status   MRSA by PCR NEGATIVE NEGATIVE Final    Comment:        The GeneXpert MRSA Assay (FDA approved for NASAL specimens only), is one component of a comprehensive MRSA colonization surveillance program. It is not intended to diagnose MRSA infection nor to guide or monitor treatment for MRSA infections. Performed at [facility], [place] [address] [place], [place] [address]      Time coordinating discharge: Over 30 minutes  SIGNED:   Louellen Molder, MD  [facility] 06/26/2017, 10:10 AM Pager   If 7PM-7AM, please contact night-coverage [email or web address] Password [email or web address]

## 2017-06-26 NOTE — Progress Notes (Signed)
PT Cancellation Note  Patient Details Name: Grant Lara MRN: [id number] DOB: September 23, 1940   Cancelled Treatment:    Reason Eval/Treat Not Completed: Pain limiting ability to participate. Pt shaking with pain on arrival and requesting pain meds. Very lethargic and unable to move upper and lower extremities at this time secondary to pain. Will check back after medicated and as time allows.   Benjiman Core, PTA Pager [phone] Acute Rehab   Allena Katz 06/26/2017, 11:50 AM

## 2017-06-26 NOTE — Progress Notes (Signed)
Patient will DC to: [place] Anticipated DC date: 06/26/17 Family notified: Spouse Transport by: Corey Harold 1:30pm   Per MD patient ready for DC to [facility]. RN, patient, patient's family, and facility notified of DC. Discharge Summary sent to facility. RN given number for report [phone] Room 107b). DC packet on chart. Ambulance transport requested for patient.   CSW signing off.  Cedric Fishman, LCSW Clinical Social Worker [phone]

## 2017-06-26 NOTE — Progress Notes (Signed)
Occupational Therapy Treatment Patient Details Name: Grant Lara MRN: [id number] DOB: 04/25/1940 Today's Date: 06/26/2017    History of present illness Pt is a 77 y.o. male with medical history significant severe coronary artery disease status post stenting, ischemic cardiomyopathy status post AICD placement, history of pulmonary embolism on Xarelto, chronic anemia with low MCV, Crohn's disease, diabetes mellitus with chronic kidney disease stage III, and dementia who came into the ER after a fall at his facility. CT revealed C5 vertebral body anterior superior endplate fracture. Per H&P, Dr. Ronnald Ramp with nuerosurgery, pt to wear soft collar and follow-up in office in 2-3 weeks.   OT comments  Focus of session on bed level self feeding and grooming. Pt became agitated when asked to sit EOB and transfer to chair. SNF continues to be the optimal d/c disposition.   Follow Up Recommendations  SNF;Supervision/Assistance - 24 hour    Equipment Recommendations       Recommendations for Other Services      Precautions / Restrictions Precautions Precautions: Fall;Cervical Precaution Comments: Assisted pt to adhere to precautions. Wearing collar throughout.  Required Braces or Orthoses: Cervical Brace Cervical Brace: Soft collar       Mobility Bed Mobility               General bed mobility comments: pt refusing EOB or OOB, became agitated  Transfers                 General transfer comment: refused    Balance                                           ADL either performed or assessed with clinical judgement   ADL Overall ADL's : Needs assistance/impaired Eating/Feeding: Maximal assistance;Bed level   Grooming: Wash/dry hands;Wash/dry face;Maximal assistance;Bed level           Upper Body Dressing : Total assistance;Bed level Upper Body Dressing Details (indicate cue type and reason): socks                         Vision        Perception     Praxis      Cognition Arousal/Alertness: Awake/alert Behavior During Therapy: Agitated Overall Cognitive Status: History of cognitive impairments - at baseline                                 General Comments: increased time to follow commands, actively resistant to OOB activity        Exercises     Shoulder Instructions       General Comments      Pertinent Vitals/ Pain       Pain Assessment: Faces Faces Pain Scale: Hurts little more Pain Location: generalized Pain Descriptors / Indicators: Grimacing;Sore  Home Living                                          Prior Functioning/Environment              Frequency  Min 1X/week        Progress Toward Goals  OT Goals(current goals can now be found in the care plan section)  Progress towards OT goals: Not progressing toward goals - comment(pt agitated)  Acute Rehab OT Goals Patient Stated Goal: to be left alone OT Goal Formulation: Patient unable to participate in goal setting Time For Goal Achievement: 07/07/17 Potential to Achieve Goals: Fair ADL Goals Pt Will Perform Eating: with min assist;sitting Pt Will Perform Grooming: with min assist;sitting Pt Will Perform Upper Body Dressing: with min assist;sitting Pt Will Transfer to Toilet: with min assist;stand pivot transfer;regular height toilet;bedside commode  Plan Discharge plan remains appropriate    Co-evaluation                 AM-PAC PT "6 Clicks" Daily Activity     Outcome Measure   Help from another person eating meals?: A Lot Help from another person taking care of personal grooming?: A Lot Help from another person toileting, which includes using toliet, bedpan, or urinal?: A Lot Help from another person bathing (including washing, rinsing, drying)?: A Lot Help from another person to put on and taking off regular upper body clothing?: A Lot Help from another person to put on and taking  off regular lower body clothing?: Total 6 Click Score: 11    End of Session    OT Visit Diagnosis: Other abnormalities of gait and mobility (R26.89);Other symptoms and signs involving cognitive function   Activity Tolerance Treatment limited secondary to agitation   Patient Left in bed;with call bell/phone within reach;with bed alarm set   Nurse Communication          Time: [phone] OT Time Calculation (min): 20 min  Charges: OT General Charges $OT Visit: 1 Visit OT Treatments $Self Care/Home Management : 8-22 mins  06/26/2017 Nestor Lewandowsky, OTR/L Pager: [phone] Werner Lean Haze Boyden 06/26/2017, 9:48 AM

## 2017-06-26 NOTE — Progress Notes (Signed)
Patient's wife  [name] notified that patient returning to facility today and ambulance was schedule to pick up at 1:30 pm. IV removed and patient dressing and ready for transport. Patient had sutures OTA midline to forehead, stage 2 to  Buttock and left heel wound that is unstageable.

## 2017-06-30 LAB — CBC AND DIFFERENTIAL
HCT: 33 — AB (ref 41–53)
Hemoglobin: 10.6 — AB (ref 13.5–17.5)
Neutrophils Absolute: 6
PLATELETS: 254 (ref 150–399)
WBC: 8.3

## 2017-06-30 LAB — BASIC METABOLIC PANEL
BUN: 17 (ref 4–21)
CREATININE: 0.9 (ref 0.6–1.3)
GLUCOSE: 150
Potassium: 5.1 (ref 3.4–5.3)
Sodium: 138 (ref 137–147)

## 2017-07-03 ENCOUNTER — Non-Acute Institutional Stay (SKILLED_NURSING_FACILITY): Payer: Medicare Other | Admitting: Internal Medicine

## 2017-07-03 ENCOUNTER — Other Ambulatory Visit: Payer: Self-pay

## 2017-07-03 ENCOUNTER — Encounter: Payer: Self-pay | Admitting: Internal Medicine

## 2017-07-03 DIAGNOSIS — Z8673 Personal history of transient ischemic attack (TIA), and cerebral infarction without residual deficits: Secondary | ICD-10-CM

## 2017-07-03 DIAGNOSIS — F0150 Vascular dementia without behavioral disturbance: Secondary | ICD-10-CM

## 2017-07-03 DIAGNOSIS — S0101XA Laceration without foreign body of scalp, initial encounter: Secondary | ICD-10-CM

## 2017-07-03 DIAGNOSIS — D649 Anemia, unspecified: Secondary | ICD-10-CM | POA: Diagnosis not present

## 2017-07-03 DIAGNOSIS — S12400A Unspecified displaced fracture of fifth cervical vertebra, initial encounter for closed fracture: Secondary | ICD-10-CM

## 2017-07-03 DIAGNOSIS — M79672 Pain in left foot: Secondary | ICD-10-CM

## 2017-07-03 MED ORDER — TRAMADOL HCL 50 MG PO TABS: 50.0000 mg | ORAL_TABLET | Freq: Three times a day (TID) | ORAL | 0 refills | Status: DC

## 2017-07-03 NOTE — Telephone Encounter (Signed)
RX faxed to [facility] @ [phone], phone number [phone]

## 2017-07-03 NOTE — Progress Notes (Signed)
Patient ID: Grant Lara, male   DOB: Oct 06, 1940, 77 y.o.   MRN: [id number]  Provider:  DR Arletha Grippe Location:  [place] Room Number: [place] of Service:  SNF (31)  PCP: Gildardo Cranker, DO Patient Care Team: Gildardo Cranker, DO as PCP - General (Internal Medicine) [facility], [place] ([place])  Extended Emergency Contact Information Primary Emergency Contact: Hoh,Curtis          [place], [place] of [place] Phone: [phone] Relation: Son Secondary Emergency Contact: Girardin,[name] Address: [address]          [place], [place] [address] [name] of [place] Mobile Phone: [phone] Relation: Spouse  Code Status:  DNR Goals of Care: Advanced Directive information Advanced Directives 07/03/2017  Does Patient Have a Medical Advance Directive? Yes  Type of Advance Directive Out of facility DNR (pink MOST or yellow form)  Does patient want to make changes to medical advance directive? No - Patient declined  Copy of [place] in Chart? -  Would patient like information on creating a medical advance directive? No - Patient declined  Pre-existing out of facility DNR order (yellow form or pink MOST form) Yellow form placed in chart (order not valid for inpatient use)      Chief Complaint  Patient presents with  . Readmit To SNF    Readmission    HPI: Patient is a 77 y.o. male seen today for re-admission to SNF following hospital stay for acute/chronic blood loss anemia, PE/DVT, COPD , hx CVA, vascular dementia, right neck pain. CT head showed frontal scalp laceration and chronic infarctions/microvascualr changes but no acute process. CT C spine (+) C5 vertebral body acute minimally displaced fx and fx of right transverse process. neurosx consulted. He rec'd 4 units PRBCs. EGD not done due to cervical fx and presence of collar. Hgb dropped 6.1-->10.3 post transfusion. He presents to SNF to  continue long term care.  Today he reports left heel pain. No other concerns. Appetite is poor. He gets nutritional supplements per facility protocol. Sleeps well. He is a poor historian due to dementia. Hx obtained from chart.  Seizure d/o - controlled on keppra 500 mg twice daily; no recent sz activity  DM - diet controlled. A1c 5.9%; not on ACEI/ARB 2/2 low BPs. CBG 110-180s.  Bilateral lower extremity pain - controlled on tylenol  1 gm twice daily and ultram 50 mg twice daily  Dyslipidemia - stable off lipitor 2/2 weight loss; LDL 67   Dementia - progressive; he takes aricept 10 mg nightly. Weight down 12 lbs. Albumin 3  BPH - stable on flomax 0.4 mg daily   Chronic diastoic heart failure - EF 30-35% (10-06-15); s/p AICD placement (interrogated; now pace maker function only). Stable without medication. He takes ASA daily  CAD - no angina. Stable on ASA 81 mg daily  COPD - sx's stable without medication. No recent exacerbation   Depression - controlled on zoloft 75 mg daily; no longer on trazodone 50 mg nightly; GDR done 03-05-17  CKD  - stage 3. Stable. 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   Crohn's disease - he has constipation that is stable on senna daily   Hypotension - improved. Current BP 124/68   Past Medical History:  Diagnosis  Date  . AICD (automatic cardioverter/defibrillator) present   . Arthritis    "used to have a touch in my legs"  . Bradycardia 06/01/2014  . CAD (coronary artery disease)    LHC 9/05 with Dr. Einar Gip:  dLM 20-30%, LAD 85%, oD1 20-30%.  PCI:  Taxus DES to LAD; Dx jailed and tx with POBA.  Last myoview 12/10: inf scar, no ischemia, EF 29%.  . CHF (congestive heart failure) ([place])   . Crohn's disease ([place])   . Dementia   . Diabetes mellitus    11/05/11 "borderline; don't take medications"  . Dilated cardiomyopathy ([place])     2/12: EF 30-35%, trivial AI, mild RAE.  EF 2014 40 -45%  . DVT (deep venous thrombosis) ([place])   . HCAP (healthcare-associated pneumonia) 09/29/2015  . HLD (hyperlipidemia)   . 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 Hyperlipidemia   . Hypertension   . Memory difficulties   . Nephrolithiasis   . PFO (patent foramen ovale)    Not mentioned on 2014 echo.  . Pneumonia ~ 2011   09-22-13 denies any recent SOB or breathing problems  . Pulmonary embolus (H[facility])    chronic coumadin  . Stroke [facility]) 1993   "left arm can't hold steady; leg too"  . Swelling of both ankles     09-22-13 occ.feet, but denies pain.   Past Surgical History:  Procedure Laterality Date  . APPENDECTOMY    . COLON SURGERY  1994; 1996   "for Crohn's disease"  . IMPLANTABLE CARDIOVERTER DEFIBRILLATOR IMPLANT N/A 06/02/2014   Procedure: IMPLANTABLE CARDIOVERTER DEFIBRILLATOR IMPLANT;  Surgeon: [name], MD;  Location: [facility] CATH LAB;  Service: Cardiovascular;  Laterality: N/A;    reports that he has been smoking cigarettes.  He has a 26.50 pack-year smoking history. He has never used smokeless tobacco. He reports that he does not drink alcohol or use drugs. Social History   Socioeconomic History  . Marital status: Married    Spouse name: Not on file  . Number of children: Not on file  . Years of education: Not on file  . Highest education level: Not on file  Occupational History  . Not on file  Social Needs  . Financial resource strain: Not on file  . Food insecurity:    Worry: Not on file    Inability: Not on file  . Transportation needs:    Medical: Not on file    Non-medical: Not on file  Tobacco Use  . Smoking status: Current Some Day Smoker    Packs/day: 0.50    Years: 53.00    Pack years: 26.50    Types: Cigarettes  . Smokeless tobacco: Never Used  Substance and Sexual Activity  . Alcohol use: No    Alcohol/week: 0.0 oz  . Drug use: No  . Sexual activity: Not Currently  Lifestyle  . Physical activity:     Days per week: Not on file    Minutes per session: Not on file  . Stress: Not on file  Relationships  . Social connections:    Talks on phone: Not on file    Gets together: Not on file    Attends religious service: Not on file    Active member of club or organization: Not on file    Attends meetings of clubs or organizations: Not on file    Relationship status: Not on file  . Intimate partner violence:    Fear of current or ex partner: Not on file  Emotionally abused: Not on file    Physically abused: Not on file    Forced sexual activity: Not on file  Other Topics Concern  . Not on file  Social History Narrative  . Not on file    Functional Status Survey:    Family History  Problem Relation Age of Onset  . Diabetes type II Mother   . CAD Father   . Diabetes type II Brother     Health Maintenance  Topic Date Due  . INFLUENZA VACCINE  11/21/2017 (Originally 11/13/2016)  . HEMOGLOBIN A1C  07/04/2018 (Originally 06/23/2017)  . PNA vac Low Risk Adult (2 of 2 - PCV13) 04/16/2023 (Originally 02/20/2011)  . URINE MICROALBUMIN  11/12/2017  . FOOT EXAM  12/23/2017  . OPHTHALMOLOGY EXAM  03/20/2018  . TETANUS/TDAP  06/05/2026    Allergies  Allergen Reactions  . Tuberculin Tests Other (See Comments)    Per MAR (no description provided)    Outpatient Encounter Medications as of 07/03/2017  Medication Sig  . donepezil (ARICEPT) 10 MG tablet Take 10 mg by mouth at bedtime.   . gabapentin (NEURONTIN) 100 MG capsule Take 100 mg by mouth at bedtime.  [name] ipratropium-albuterol (DUONEB) 0.5-2.5 (3) MG/3ML SOLN Take 3 mLs by nebulization every 6 (six) hours as needed (for breathing).   [name] latanoprost (XALATAN) 0.005 % ophthalmic solution Place 1 drop into both eyes at bedtime.  . levETIRAcetam (KEPPRA) 500 MG tablet Take 1 tablet (500 mg total) by mouth 2 (two) times daily.  . mirtazapine (REMERON) 7.5 MG tablet Take 7.5 mg by mouth at bedtime.  . Multiple Vitamin (MULTIVITAMIN) tablet  Take 1 tablet by mouth daily.  . rivaroxaban (XARELTO) 20 MG TABS tablet Take 20 mg by mouth at bedtime.  . senna (SENOKOT) 8.6 MG TABS tablet Take 1 tablet by mouth every morning.  . sertraline (ZOLOFT) 50 MG tablet Take 75 mg by mouth daily.  . tamsulosin (FLOMAX) 0.4 MG CAPS capsule Take 0.4 mg by mouth at bedtime.  . traMADol (ULTRAM) 50 MG tablet Take 1 tablet (50 mg total) by mouth 3 (three) times daily. Hold for sedation   No facility-administered encounter medications on file as of 07/03/2017.     Review of Systems  Unable to perform ROS: Dementia    Vitals:   07/03/17 1025  BP: 124/78  Pulse: 80  Resp: 16  Temp: 98.7 F (37.1 C)  SpO2: 98%  Weight: 108 lb 1.6 oz (49 kg)  Height: 5' 3"  (1.6 m)   Body mass index is 19.15 kg/m. Physical Exam  Constitutional: He appears well-developed.  Frail appearing in NAD, sitting up in bed, neck soft collar intact  HENT:  Head: Head is with laceration (scalp with x 5 sutures).  Mouth/Throat: Oropharynx is clear and moist.  MMM; no oral thrush  Eyes: Pupils are equal, round, and reactive to light. No scleral icterus.  Neck: Neck supple. Carotid bruit is not present. No thyromegaly present.  Cardiovascular: Normal rate, regular rhythm and intact distal pulses. Exam reveals no gallop and no friction rub.  Murmur (1/6 SEM) heard. Trace ankle swelling L>R; no calf TTP  Pulmonary/Chest: Effort normal and breath sounds normal. He has no wheezes. He has no rales. He exhibits no tenderness.  Abdominal: Soft. Normal appearance and bowel sounds are normal. He exhibits no distension, no abdominal bruit, no pulsatile midline mass and no mass. There is no hepatomegaly. There is no tenderness. There is no rigidity, no rebound and no guarding. No  hernia.  Musculoskeletal: He exhibits tenderness (left heel).  Lymphadenopathy:    He has no cervical adenopathy.  Neurological: He is alert.  Skin: Skin is warm and dry. No rash noted.  Psychiatric:  He has a normal mood and affect. His behavior is normal. Thought content normal.    Labs reviewed: Basic Metabolic Panel: Recent Labs    04/24/17 1818  06/22/17 1430 06/23/17 0621 06/24/17 0503 06/25/17 0439  NA 136   < >  --  136 140 139  K 4.5   < >  --  4.1 4.2 4.3  CL 108   < >  --  102 103 104  CO2 20*   < >  --  24 22 27   GLUCOSE 184*   < >  --  87 72 57*  BUN 30*   < >  --  14 11 15   CREATININE 2.47*   < >  --  1.08 1.15 1.07  CALCIUM 7.9*   < >  --  8.4* 8.6* 8.9  MG 1.7  --  1.8  --   --   --    < > = values in this interval not displayed.   Liver Function Tests: Recent Labs    04/24/17 0325 04/24/17 1818 04/28/17 05/01/17  AST 26 40 15 26  ALT 13* 17 13 32  ALKPHOS 81 69 80 99  BILITOT 0.5 0.6  --   --   PROT 6.6 5.8*  --   --   ALBUMIN 3.0* 2.6*  --   --    No results for input(s): LIPASE, AMYLASE in the last 8760 hours. No results for input(s): AMMONIA in the last 8760 hours. CBC: Recent Labs    05/01/17 05/09/17  06/22/17 0825  06/24/17 0503 06/25/17 0439 06/26/17 0307  WBC 13.9 12.0  --  10.1  --  10.9* 11.2*  --   NEUTROABS 11 9  --  8.0*  --   --   --   --   HGB 8.3* 8.1*   < > 6.1*   < > 10.8* 11.4* 10.3*  HCT 27* 26*   < > 21.4*   < > 33.4* 37.8* 33.8*  MCV  --   --   --  74.0*  --  80.1 82.5  --   PLT 254 502*  --  381  --  298 322  --    < > = values in this interval not displayed.   Cardiac Enzymes: Recent Labs    04/24/17 0325 04/24/17 1818 04/25/17 0011  TROPONINI <0.03 <0.03 <0.03   BNP: Invalid input(s): POCBNP Lab Results  Component Value Date   HGBA1C 6.4 07/31/2016   Lab Results  Component Value Date   TSH 1.77 05/08/2017   Lab Results  Component Value Date   BTDVVOHY07 371 09/09/2015   Lab Results  Component Value Date   FOLATE 10.7 09/09/2015   Lab Results  Component Value Date   IRON 68 09/09/2015   TIBC 449 09/09/2015   FERRITIN 4 (L) 09/09/2015    Imaging and Procedures obtained prior to SNF  admission: Dg Pelvis 1-2 Views  Result Date: 06/22/2017 CLINICAL DATA:  Status post fall, with concern for pelvic injury. Initial encounter. EXAM: PELVIS - 1-2 VIEW COMPARISON:  CT of the chest, abdomen and pelvis from 04/18/2014 FINDINGS: There is no evidence of fracture or dislocation. Both femoral heads are seated normally within their respective acetabula. No significant degenerative change is appreciated. The  sacroiliac joints are unremarkable in appearance. The visualized bowel gas pattern is grossly unremarkable in appearance. IMPRESSION: No evidence of fracture or dislocation. Electronically Signed   By: Garald Balding M.D.   On: 06/22/2017 06:37   Ct Head Wo Contrast  Result Date: 06/22/2017 CLINICAL DATA:  77 y/o M; fall. History of dementia on blood thinners. EXAM: CT HEAD WITHOUT CONTRAST CT CERVICAL SPINE WITHOUT CONTRAST TECHNIQUE: Multidetector CT imaging of the head and cervical spine was performed following the standard protocol without intravenous contrast. Multiplanar CT image reconstructions of the cervical spine were also generated. COMPARISON:  04/24/2017 CT head and cervical spine. FINDINGS: CT HEAD FINDINGS Brain: No evidence of acute infarction, hemorrhage, hydrocephalus, extra-axial collection or mass lesion/mass effect. 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 Stable chronic microvascular ischemic changes and parenchymal volume loss of the brain. Stable small chronic lacunar infarcts within bilateral basal ganglia. Vascular: Calcific atherosclerosis of carotid siphons. Skull: Frontal scalp laceration.  No calvarial fracture. Sinuses/Orbits: No acute finding. Other: None. CT CERVICAL SPINE FINDINGS Alignment: Normal. Skull base and vertebrae: C5 vertebral body anterior superior endplate acute minimally displaced limbus fracture and fracture of right transverse process (series 10, image 29 and series 11, image  13). No other fracture or dislocation identified. Soft tissues and spinal canal: No prevertebral fluid or swelling. No visible canal hematoma. Disc levels: Moderate cervical spondylosis with multilevel disc and facet degenerative changes. No high-grade bony canal stenosis. Uncovertebral and facet hypertrophy encroaches on the bony neural foramina on the left side at C3-C7 and on the right side at C3-4. Upper chest: Negative. Other: Negative. IMPRESSION: CT head: 1. Frontal scalp laceration.  No calvarial fracture. 2. No acute intracranial abnormality. 3. Stable chronic infarctions, microvascular ischemic changes, and parenchymal volume loss of the brain. CT cervical spine: C5 vertebral body anterior superior endplate acute minimally displaced fracture and fracture of right transverse process. No malalignment. No other fracture identified. These results were called by telephone at the time of interpretation on 06/22/2017 at 6:46 am to Dr. Deno Etienne , who verbally acknowledged these results. Electronically Signed   By: Kristine Garbe M.D.   On: 06/22/2017 06:48   Ct Cervical Spine Wo Contrast  Result Date: 06/22/2017 CLINICAL DATA:  77 y/o M; fall. History of dementia on blood thinners. EXAM: CT HEAD WITHOUT CONTRAST CT CERVICAL SPINE WITHOUT CONTRAST TECHNIQUE: Multidetector CT imaging of the head and cervical spine was performed following the standard protocol without intravenous contrast. Multiplanar CT image reconstructions of the cervical spine were also generated. COMPARISON:  04/24/2017 CT head and cervical spine. FINDINGS: CT HEAD FINDINGS Brain: No evidence of acute infarction, hemorrhage, hydrocephalus, extra-axial collection or mass lesion/mass effect. 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 Stable chronic microvascular ischemic changes and parenchymal volume loss of the brain. Stable small chronic lacunar infarcts  within bilateral basal ganglia. Vascular: Calcific atherosclerosis of carotid siphons. Skull: Frontal scalp laceration.  No calvarial fracture. Sinuses/Orbits: No acute finding. Other: None. CT CERVICAL SPINE FINDINGS Alignment: Normal. Skull base and vertebrae: C5 vertebral body anterior superior endplate acute minimally displaced limbus fracture and fracture of right transverse process (series 10, image 29 and series 11, image 13). No other fracture or dislocation identified. Soft tissues and spinal canal: No prevertebral fluid or swelling. No visible canal hematoma. Disc levels: Moderate cervical spondylosis with multilevel disc and facet degenerative changes. No high-grade bony canal stenosis. Uncovertebral and facet  hypertrophy encroaches on the bony neural foramina on the left side at C3-C7 and on the right side at C3-4. Upper chest: Negative. Other: Negative. IMPRESSION: CT head: 1. Frontal scalp laceration.  No calvarial fracture. 2. No acute intracranial abnormality. 3. Stable chronic infarctions, microvascular ischemic changes, and parenchymal volume loss of the brain. CT cervical spine: C5 vertebral body anterior superior endplate acute minimally displaced fracture and fracture of right transverse process. No malalignment. No other fracture identified. These results were called by telephone at the time of interpretation on 06/22/2017 at 6:46 am to Dr. Deno Etienne , who verbally acknowledged these results. Electronically Signed   By: Kristine Garbe M.D.   On: 06/22/2017 06:48    Assessment/Plan   ICD-10-CM   1. Pain of left heel M79.672   2. Anemia, unspecified type D64.9   3. Laceration of scalp, initial encounter S01.01XA   4. Closed displaced fracture of fifth cervical vertebra, unspecified fracture morphology, initial encounter ([place]) S12.400A   5. History of stroke Z86.73   6. Vascular dementia without behavioral disturbance F01.50      Check xray left foot due to pain  Wound  care as indicated  PT/OT/ST as indicated  F/u with specialists as scheduled  Cont nutritional supplements as ordered  GOAL: continue long term care. Communicated with pt and nursing.  Will follow  Labs/tests ordered: left foot xray  Zoria Rawlinson S. Perlie Gold  [facility] [address] [place], [place] [address] [phone] Cell (Monday-Friday 8 AM - 5 PM) [phone] After 5 PM and follow prompts

## 2017-08-07 ENCOUNTER — Non-Acute Institutional Stay: Payer: Self-pay | Admitting: Internal Medicine

## 2017-08-07 DIAGNOSIS — Z515 Encounter for palliative care: Secondary | ICD-10-CM

## 2017-08-07 NOTE — Progress Notes (Signed)
FAMILY  PALLIATIVE CARE CONSULT  PATIENT NAME: DEZMON CONOVER DOB: 1940-12-29 MRN: [id number]  REFERRING PROVIDER: Gildardo Cranker, [address] [place], [place] [id number]   Arrived at facility for scheduled meeting with wife, Sutton Hirsch, for discussion of goals of care and advanced care planning.  Wife was not at facility, never arrived and a voice mail message was received when I called her listed contact number.  Palliative care will continue to follow patient and re-attempt communication with wife. DNR status has been previously obtained by current primary [employer] provider.  Status changed in chart.   Gonzella Lex, NP

## 2017-08-13 ENCOUNTER — Other Ambulatory Visit: Payer: Self-pay

## 2017-08-13 MED ORDER — TRAMADOL HCL 50 MG PO TABS: 50.0000 mg | ORAL_TABLET | Freq: Three times a day (TID) | ORAL | 5 refills | Status: AC

## 2017-08-13 NOTE — Telephone Encounter (Signed)
Rx faxed to [facility] (p) [phone], (f) [phone]

## 2017-09-01 LAB — BASIC METABOLIC PANEL
BUN: 16 (ref 4–21)
Creatinine: 1.1 (ref 0.6–1.3)
GLUCOSE: 83
Potassium: 4.3 (ref 3.4–5.3)
SODIUM: 142 (ref 137–147)

## 2017-09-01 LAB — LIPID PANEL
Cholesterol: 156 (ref 0–200)
HDL: 62 (ref 35–70)
LDL Cholesterol: 77
Triglycerides: 86 (ref 40–160)

## 2017-09-01 LAB — CBC AND DIFFERENTIAL
HCT: 36 — AB (ref 41–53)
Hemoglobin: 11.2 — AB (ref 13.5–17.5)
NEUTROS ABS: 3
Platelets: 175 (ref 150–399)
WBC: 5

## 2017-09-01 LAB — HEMOGLOBIN A1C: Hemoglobin A1C: 4.6

## 2017-09-01 LAB — VITAMIN D 25 HYDROXY (VIT D DEFICIENCY, FRACTURES): VIT D 25 HYDROXY: 17.39

## 2017-09-08 ENCOUNTER — Encounter: Payer: Self-pay | Admitting: Internal Medicine

## 2017-09-08 ENCOUNTER — Non-Acute Institutional Stay (SKILLED_NURSING_FACILITY): Payer: Medicare Other | Admitting: Internal Medicine

## 2017-09-08 DIAGNOSIS — R634 Abnormal weight loss: Secondary | ICD-10-CM | POA: Diagnosis not present

## 2017-09-08 DIAGNOSIS — Z8673 Personal history of transient ischemic attack (TIA), and cerebral infarction without residual deficits: Secondary | ICD-10-CM | POA: Diagnosis not present

## 2017-09-08 DIAGNOSIS — R569 Unspecified convulsions: Secondary | ICD-10-CM

## 2017-09-08 DIAGNOSIS — S12400D Unspecified displaced fracture of fifth cervical vertebra, subsequent encounter for fracture with routine healing: Secondary | ICD-10-CM

## 2017-09-08 DIAGNOSIS — F0150 Vascular dementia without behavioral disturbance: Secondary | ICD-10-CM | POA: Diagnosis not present

## 2017-09-08 DIAGNOSIS — I2782 Chronic pulmonary embolism: Secondary | ICD-10-CM

## 2017-09-08 NOTE — Progress Notes (Signed)
Patient ID: Grant Lara, male   DOB: 1940-07-21, 77 y.o.   MRN: [id number]  Location:  [facility] Room Number: 710G Place of Service:  SNF [phone]) Provider:  [place], Goldthwaite, DO  Patient Care Team: Gildardo Cranker, DO as PCP - General (Internal Medicine) [facility], [place] ([place])  Extended Emergency Contact Information Primary Emergency Contact: Masaki,Curtis          [place], [place] Phone: [phone] Relation: Son Secondary Emergency Contact: Matuszak,[name] Address: [address]          [place], [place] [address] [name] of [place] Mobile Phone: [phone] Relation: Spouse  Code Status:  DNR Goals of care: Advanced Directive information Advanced Directives 07/03/2017  Does Patient Have a Medical Advance Directive? Yes  Type of Advance Directive Out of facility DNR (pink MOST or yellow form)  Does patient want to make changes to medical advance directive? No - Patient declined  Copy of [place] in Chart? -  Would patient like information on creating a medical advance directive? No - Patient declined  Pre-existing out of facility DNR order (yellow form or pink MOST form) Yellow form placed in chart (order not valid for inpatient use)     Chief Complaint  Patient presents with  . Medical Management of Chronic Issues    HPI:  Pt is a 77 y.o. male seen today for medical management of chronic diseases.  He reports feeling well overall. No falls. Appetite ok and sleeps well. No nursing issues. He is a poor historian due to dementia. Hx obtained from chart  Seizure d/o - controlled on keppra 500 mg twice daily; no recent sz activity. Na 138  DM - diet controlled. A1c 5.9%; not on ACEI/ARB 2/2 low BPs  Bilateral lower extremity pain - controlled on tylenol 1 gm twice daily and ultram 50 mg twice daily  Dyslipidemia - stable off lipitor 2/2 weight loss; LDL  67   Dementia - progressive; he takes aricept 10 mg nightly. Weight up 8 lbs on mirtazepine. Albumin 3.3  BPH - controlled on flomax 0.4 mg daily   Chronic diastoic heart failure - EF 30-35% (10-06-15); s/p AICD placement (interrogated; now pace maker function only). Stable without medication. He takes xeralto daily.  CAD - no angina. Stable on xeralto daily  COPD - sx's stable on prn duoneb  Depression - mood stable on zoloft 75 mg daily; no longer on trazodone 50 mg nightly; GDR done 03-05-17  CKD  - stage 3. Stable. Cr 1.1  Anemia of chronic disease - stable. Hgb 10.6  Hx PE - stable on xarelto 20 mg daily   Hx CVA - residual left hemiparesis; stable on xarelto 20 mg daily   Glaucoma - stable on xalatan to both eyes.   Crohn's disease - he has constipation that is stable on senna daily   Hypotension - improved. Current BP 124/68   Past Medical History:  Diagnosis Date  . AICD (automatic cardioverter/defibrillator) present   . Arthritis    "used to have a touch in my legs"  . Bradycardia 06/01/2014  . CAD (coronary artery disease)    LHC 9/05 with Dr. Einar Gip:  dLM 20-30%, LAD 85%, oD1 20-30%.  PCI:  Taxus DES to LAD; Dx jailed and tx with POBA.  Last myoview 12/10: inf scar, no ischemia, EF 29%.  . CHF (congestive heart failure) ([place])   . Crohn's disease (  [place])   . Dementia   . Diabetes mellitus    11/05/11 "borderline; don't take medications"  . Dilated cardiomyopathy ([place])    2/12: EF 30-35%, trivial AI, mild RAE.  EF 2014 40 -45%  . DVT (deep venous thrombosis) ([place])   . HCAP (healthcare-associated pneumonia) 09/29/2015  . HLD (hyperlipidemia)   . 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 Hyperlipidemia   . Hypertension   . Memory difficulties   . Nephrolithiasis   . PFO (patent foramen ovale)    Not mentioned on 2014 echo.  . Pneumonia ~ 2011   09-22-13 denies any recent SOB or breathing problems  . Pulmonary embolus (HCC)    chronic coumadin  . Stroke [facility]) 1993   "left  arm can't hold steady; leg too"  . Swelling of both ankles     09-22-13 occ.feet, but denies pain.   Past Surgical History:  Procedure Laterality Date  . APPENDECTOMY    . COLON SURGERY  1994; 1996   "for Crohn's disease"  . IMPLANTABLE CARDIOVERTER DEFIBRILLATOR IMPLANT N/A 06/02/2014   Procedure: IMPLANTABLE CARDIOVERTER DEFIBRILLATOR IMPLANT;  Surgeon: [name], MD;  Location: [facility] CATH LAB;  Service: Cardiovascular;  Laterality: N/A;    Allergies  Allergen Reactions  . Tuberculin Tests Other (See Comments)    Per MAR (no description provided)    Outpatient Encounter Medications as of 09/08/2017  Medication Sig  . donepezil (ARICEPT) 10 MG tablet Take 10 mg by mouth at bedtime.   . gabapentin (NEURONTIN) 100 MG capsule Take 100 mg by mouth at bedtime.  [name] ipratropium-albuterol (DUONEB) 0.5-2.5 (3) MG/3ML SOLN Take 3 mLs by nebulization every 6 (six) hours as needed (for breathing).   [name] latanoprost (XALATAN) 0.005 % ophthalmic solution Place 1 drop into both eyes at bedtime.  . levETIRAcetam (KEPPRA) 500 MG tablet Take 1 tablet (500 mg total) by mouth 2 (two) times daily.  . mirtazapine (REMERON) 7.5 MG tablet Take 7.5 mg by mouth at bedtime.  . Multiple Vitamin (MULTIVITAMIN) tablet Take 1 tablet by mouth daily.  . rivaroxaban (XARELTO) 20 MG TABS tablet Take 20 mg by mouth at bedtime.  . senna (SENOKOT) 8.6 MG TABS tablet Take 1 tablet by mouth every morning.  . sertraline (ZOLOFT) 50 MG tablet Take 75 mg by mouth daily.  . tamsulosin (FLOMAX) 0.4 MG CAPS capsule Take 0.4 mg by mouth at bedtime.  . traMADol (ULTRAM) 50 MG tablet Take 1 tablet (50 mg total) by mouth 3 (three) times daily. Hold for sedation   No facility-administered encounter medications on file as of 09/08/2017.     Review of Systems  Unable to perform ROS: Dementia    Immunization History  Administered Date(s) Administered  . Influenza Whole 02/09/2010  . Influenza-Unspecified 01/21/2016  . PPD Test  11/05/2011, 10/09/2015  . Pneumococcal Polysaccharide-23 02/19/2010  . Tdap 06/05/2016   Pertinent  Health Maintenance Due  Topic Date Due  . INFLUENZA VACCINE  11/21/2017 (Originally 11/13/2017)  . PNA vac Low Risk Adult (2 of 2 - PCV13) 04/16/2023 (Originally 02/20/2011)  . URINE MICROALBUMIN  11/12/2017  . FOOT EXAM  12/23/2017  . HEMOGLOBIN A1C  03/04/2018  . OPHTHALMOLOGY EXAM  03/20/2018   Fall Risk  11/11/2016  Falls in the past year? Yes  Number falls in past yr: 2 or more  Injury with Fall? Yes   Functional Status Survey:    Vitals:   09/08/17 1126  BP: 122/70  Pulse: 86  Temp: (!) 97.2 F (36.2  C)  SpO2: 97%  Weight: 114 lb 1.6 oz (51.8 kg)   Body mass index is 20.21 kg/m. Physical Exam  Constitutional: He appears well-developed.  Frail appearing in NAD sitting in w/c  HENT:  Mouth/Throat: Oropharynx is clear and moist.  MMM; no oral thrush  Eyes: Pupils are equal, round, and reactive to light. No scleral icterus.  R>L eye tearing  Neck: Neck supple. Carotid bruit is not present. No thyromegaly present.  Cervical collar intact  Cardiovascular: Normal rate, regular rhythm and intact distal pulses. Exam reveals no gallop and no friction rub.  Murmur (1/6 SEM) heard. no distal LE swelling. No calf TTP  Pulmonary/Chest: Effort normal and breath sounds normal. He has no wheezes. He has no rales. He exhibits no tenderness.  Abdominal: Soft. Bowel sounds are normal. He exhibits no distension, no abdominal bruit, no pulsatile midline mass and no mass. There is no hepatomegaly. There is no tenderness. There is no rebound and no guarding.  Musculoskeletal: He exhibits edema.  Lymphadenopathy:    He has no cervical adenopathy.  Neurological: He is alert. He has normal reflexes.  Skin: Skin is warm and dry. No rash noted.  Psychiatric: He has a normal mood and affect. His behavior is normal. Thought content normal.    Labs reviewed: Recent Labs    04/24/17 1818   06/22/17 1430 06/23/17 0621 06/24/17 0503 06/25/17 0439 06/30/17 09/01/17  NA 136   < >  --  136 140 139 138 142  K 4.5   < >  --  4.1 4.2 4.3 5.1 4.3  CL 108   < >  --  102 103 104  --   --   CO2 20*   < >  --  24 22 27   --   --   GLUCOSE 184*   < >  --  87 72 57*  --   --   BUN 30*   < >  --  14 11 15 17 16   CREATININE 2.47*   < >  --  1.08 1.15 1.07 0.9 1.1  CALCIUM 7.9*   < >  --  8.4* 8.6* 8.9  --   --   MG 1.7  --  1.8  --   --   --   --   --    < > = values in this interval not displayed.   Recent Labs    04/24/17 0325 04/24/17 1818 04/28/17 05/01/17 06/11/17  AST 26 40 15 26 19   ALT 13* 17 13 32 14  ALKPHOS 81 69 80 99 92  BILITOT 0.5 0.6  --   --   --   PROT 6.6 5.8*  --   --   --   ALBUMIN 3.0* 2.6*  --   --   --    Recent Labs    06/22/17 0825  06/24/17 0503 06/25/17 0439 06/26/17 0307 06/30/17 09/01/17  WBC 10.1  --  10.9* 11.2*  --  8.3 5.0  NEUTROABS 8.0*  --   --   --   --  6 3  HGB 6.1*   < > 10.8* 11.4* 10.3* 10.6* 11.2*  HCT 21.4*   < > 33.4* 37.8* 33.8* 33* 36*  MCV 74.0*  --  80.1 82.5  --   --   --   PLT 381  --  298 322  --  254 175   < > = values in this interval not displayed.  Lab Results  Component Value Date   TSH 1.77 05/08/2017   Lab Results  Component Value Date   HGBA1C 4.6 09/01/2017   Lab Results  Component Value Date   CHOL 156 09/01/2017   HDL 62 09/01/2017   LDLCALC 77 09/01/2017   TRIG 86 09/01/2017   CHOLHDL 3.1 04/16/2014    Significant Diagnostic Results in last 30 days:  No results found.  Assessment/Plan   ICD-10-CM   1. 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 Seizures ([place]) R56.9   3. Weight loss, non-intentional R63.4   4. History of stroke Z86.73   5. Other chronic pulmonary embolism without acute cor pulmonale (HCC) I27.82   6. Closed displaced fracture of fifth cervical vertebra with routine healing, unspecified fracture morphology, subsequent encounter S12.400D     Cont current  meds as ordered  Cont nutritional supplements as ordered  Pt/OT/St as indicated  F/u with specialists as scheduled  Cont using cervical collar as directed  OPTUM NP to follow  Will follow  Labs/tests ordered: none   Addylynn Balin S. Perlie Gold  [facility] [name] [place], [place] [address] [phone] Cell (Monday-Friday 8 AM - 5 PM) [phone] After 5 PM and follow prompts

## 2017-09-11 LAB — MICROALBUMIN, URINE: MICROALB UR: 1.5

## 2017-10-02 ENCOUNTER — Encounter: Payer: Self-pay | Admitting: Internal Medicine

## 2017-10-03 DIAGNOSIS — S0101XA Laceration without foreign body of scalp, initial encounter: Secondary | ICD-10-CM | POA: Insufficient documentation

## 2017-10-03 DIAGNOSIS — Z8673 Personal history of transient ischemic attack (TIA), and cerebral infarction without residual deficits: Secondary | ICD-10-CM | POA: Insufficient documentation

## 2017-10-03 DIAGNOSIS — S12400A Unspecified displaced fracture of fifth cervical vertebra, initial encounter for closed fracture: Secondary | ICD-10-CM | POA: Insufficient documentation

## 2017-11-13 ENCOUNTER — Non-Acute Institutional Stay (SKILLED_NURSING_FACILITY): Payer: Medicare Other | Admitting: Internal Medicine

## 2017-11-13 ENCOUNTER — Encounter: Payer: Self-pay | Admitting: Internal Medicine

## 2017-11-13 DIAGNOSIS — J449 Chronic obstructive pulmonary disease, unspecified: Secondary | ICD-10-CM

## 2017-11-13 DIAGNOSIS — E1122 Type 2 diabetes mellitus with diabetic chronic kidney disease: Secondary | ICD-10-CM

## 2017-11-13 DIAGNOSIS — R569 Unspecified convulsions: Secondary | ICD-10-CM | POA: Diagnosis not present

## 2017-11-13 DIAGNOSIS — F0150 Vascular dementia without behavioral disturbance: Secondary | ICD-10-CM

## 2017-11-13 DIAGNOSIS — S12400S Unspecified displaced fracture of fifth cervical vertebra, sequela: Secondary | ICD-10-CM

## 2017-11-13 DIAGNOSIS — M542 Cervicalgia: Secondary | ICD-10-CM

## 2017-11-13 DIAGNOSIS — I825Z9 Chronic embolism and thrombosis of unspecified deep veins of unspecified distal lower extremity: Secondary | ICD-10-CM

## 2017-11-13 DIAGNOSIS — N183 Chronic kidney disease, stage 3 (moderate): Secondary | ICD-10-CM

## 2017-11-13 DIAGNOSIS — I5042 Chronic combined systolic (congestive) and diastolic (congestive) heart failure: Secondary | ICD-10-CM

## 2017-11-13 NOTE — Progress Notes (Deleted)
Patient ID: Grant Lara, male   DOB: February 02, 1941, 77 y.o.   MRN: [id number]   Location:  [facility] OFFICE  Provider: DR Arletha Grippe  Code Status: DNR Goals of Care:  Advanced Directives 07/03/2017  Does Patient Have a Medical Advance Directive? Yes  Type of Advance Directive Out of facility DNR (pink MOST or yellow form)  Does patient want to make changes to medical advance directive? No - Patient declined  Copy of [place] in Chart? -  Would patient like information on creating a medical advance directive? No - Patient declined  Pre-existing out of facility DNR order (yellow form or pink MOST form) Yellow form placed in chart (order not valid for inpatient use)     Chief Complaint  Patient presents with  . Medical Management of Chronic Issues    OPTUM    HPI: Patient is a 77 y.o. male seen today for medical management of chronic diseases.  He continues to have right neck pain but nothing new. He wears his cervical collar every day. Appetite ok and sleeps well. He is a poor historian due to dementia. Hx obtained from chart.  Seizure d/o - controlled on keppra 500 mg twice daily; no recent sz activity. Na 138  DM - diet controlled. A1c 5.9%; not on ACEI/ARB 2/2 low BPs  Bilateral lower extremity pain - controlled on tylenol 1 gm twice daily and ultram 50 mg twice daily  Dyslipidemia - stable off lipitor 2/2 weight loss; LDL 67   Dementia - progressive; he takes aricept 10 mg nightly. Weight up 8 lbs on mirtazepine. Albumin 3.3  BPH - controlled on flomax 0.4 mg daily   Chronic diastoic heart failure - EF 30-35% (10-06-15); s/p AICD placement (interrogated; now pace maker function only). Stable without medication. He takes xeralto daily.  CAD - no angina. Stable on xeralto daily  COPD - sx's stable on prn duoneb  Depression - mood stable on zoloft 75 mg daily; no longer on trazodone 50 mg nightly; GDR done 03-05-17  CKD  - stage 3. Stable. Cr 1.1  Anemia of  chronic disease - stable. Hgb 10.6  Hx PE - stable on xarelto 20 mg daily   Hx CVA - residual left hemiparesis; stable on xarelto 20 mg daily   Glaucoma - stable on xalatan to both eyes.   Crohn's disease - he has constipation that is stable on senna daily   Hypotension - improved. Current BP 124/68   Past Medical History:  Diagnosis Date  . AICD (automatic cardioverter/defibrillator) present   . Arthritis    "used to have a touch in my legs"  . Bradycardia 06/01/2014  . CAD (coronary artery disease)    LHC 9/05 with Dr. Einar Gip:  dLM 20-30%, LAD 85%, oD1 20-30%.  PCI:  Taxus DES to LAD; Dx jailed and tx with POBA.  Last myoview 12/10: inf scar, no ischemia, EF 29%.  . CHF (congestive heart failure) ([place])   . Crohn's disease ([place])   . Dementia   . Diabetes mellitus    11/05/11 "borderline; don't take medications"  . Dilated cardiomyopathy ([place])    2/12: EF 30-35%, trivial AI, mild RAE.  EF 2014 40 -45%  . DVT (deep venous thrombosis) ([place])   . HCAP (healthcare-associated pneumonia) 09/29/2015  . HLD (hyperlipidemia)   . 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 Hyperlipidemia   . Hypertension   . Memory difficulties   . Nephrolithiasis   . PFO (patent foramen ovale)  Not mentioned on 2014 echo.  . Pneumonia ~ 2011   09-22-13 denies any recent SOB or breathing problems  . Pulmonary embolus (HCC)    chronic coumadin  . Stroke [facility]) 1993   "left arm can't hold steady; leg too"  . Swelling of both ankles     09-22-13 occ.feet, but denies pain.    Past Surgical History:  Procedure Laterality Date  . APPENDECTOMY    . COLON SURGERY  1994; 1996   "for Crohn's disease"  . IMPLANTABLE CARDIOVERTER DEFIBRILLATOR IMPLANT N/A 06/02/2014   Procedure: IMPLANTABLE CARDIOVERTER DEFIBRILLATOR IMPLANT;  Surgeon: [name], MD;  Location: [facility] CATH LAB;  Service: Cardiovascular;  Laterality: N/A;     reports that he has been smoking cigarettes.  He has a 26.50 pack-year smoking history. He has  never used smokeless tobacco. He reports that he does not drink alcohol or use drugs. Social History   Socioeconomic History  . Marital status: Married    Spouse name: Not on file  . Number of children: Not on file  . Years of education: Not on file  . Highest education level: Not on file  Occupational History  . Not on file  Social Needs  . Financial resource strain: Not on file  . Food insecurity:    Worry: Not on file    Inability: Not on file  . Transportation needs:    Medical: Not on file    Non-medical: Not on file  Tobacco Use  . Smoking status: Current Some Day Smoker    Packs/day: 0.50    Years: 53.00    Pack years: 26.50    Types: Cigarettes  . Smokeless tobacco: Never Used  Substance and Sexual Activity  . Alcohol use: No    Alcohol/week: 0.0 oz  . Drug use: No  . Sexual activity: Not Currently  Lifestyle  . Physical activity:    Days per week: Not on file    Minutes per session: Not on file  . Stress: Not on file  Relationships  . Social connections:    Talks on phone: Not on file    Gets together: Not on file    Attends religious service: Not on file    Active member of club or organization: Not on file    Attends meetings of clubs or organizations: Not on file    Relationship status: Not on file  . Intimate partner violence:    Fear of current or ex partner: Not on file    Emotionally abused: Not on file    Physically abused: Not on file    Forced sexual activity: Not on file  Other Topics Concern  . Not on file  Social History Narrative  . Not on file    Family History  Problem Relation Age of Onset  . Diabetes type II Mother   . CAD Father   . Diabetes type II Brother     Allergies  Allergen Reactions  . Tuberculin Tests Other (See Comments)    Per MAR (no description provided)    Outpatient Encounter Medications as of 11/13/2017  Medication Sig  . donepezil (ARICEPT) 10 MG tablet Take 10 mg by mouth at bedtime.   . gabapentin  (NEURONTIN) 100 MG capsule Take 100 mg by mouth at bedtime.  [name] ipratropium-albuterol (DUONEB) 0.5-2.5 (3) MG/3ML SOLN Take 3 mLs by nebulization every 6 (six) hours as needed (for breathing).   [name] latanoprost (XALATAN) 0.005 % ophthalmic solution Place 1 drop into  both eyes at bedtime.  . levETIRAcetam (KEPPRA) 500 MG tablet Take 1 tablet (500 mg total) by mouth 2 (two) times daily.  . mirtazapine (REMERON) 7.5 MG tablet Take 7.5 mg by mouth at bedtime.  . Multiple Vitamin (MULTIVITAMIN) tablet Take 1 tablet by mouth daily.  . rivaroxaban (XARELTO) 20 MG TABS tablet Take 20 mg by mouth at bedtime.  . senna (SENOKOT) 8.6 MG TABS tablet Take 1 tablet by mouth every morning.  . sertraline (ZOLOFT) 50 MG tablet Take 75 mg by mouth daily.  . tamsulosin (FLOMAX) 0.4 MG CAPS capsule Take 0.4 mg by mouth at bedtime.  . traMADol (ULTRAM) 50 MG tablet Take 1 tablet (50 mg total) by mouth 3 (three) times daily. Hold for sedation   No facility-administered encounter medications on file as of 11/13/2017.     Review of Systems:  Review of Systems  Unable to perform ROS: Dementia    Health Maintenance  Topic Date Due  . INFLUENZA VACCINE  11/21/2017 (Originally 11/13/2017)  . PNA vac Low Risk Adult (2 of 2 - PCV13) 04/16/2023 (Originally 02/20/2011)  . HEMOGLOBIN A1C  03/04/2018  . OPHTHALMOLOGY EXAM  03/20/2018  . FOOT EXAM  09/11/2018  . URINE MICROALBUMIN  09/12/2018  . TETANUS/TDAP  06/05/2026    Physical Exam: Vitals:   11/13/17 1456  BP: 133/85  Pulse: 60  Temp: (!) 97.3 F (36.3 C)  SpO2: 98%  Weight: 110 lb 12.8 oz (50.3 kg)   Body mass index is 19.63 kg/m. Physical Exam  Constitutional: He appears well-developed.  Frail appearing sitting in w/c in NAD; cervical collar intact  HENT:  Mouth/Throat: Oropharynx is clear and moist.  MMM; no oral thrush  Eyes: Pupils are equal, round, and reactive to light. No scleral icterus.  Neck: Neck supple. Carotid bruit is not present. No  thyromegaly present.  Cardiovascular: Normal rate, regular rhythm and intact distal pulses. Exam reveals no gallop and no friction rub.  Murmur (1/6 SEM) heard. Trace LE edema b/l. No calf TTP  Pulmonary/Chest: Effort normal and breath sounds normal. He has no wheezes. He has no rales. He exhibits no tenderness.  Abdominal: Soft. Bowel sounds are normal. He exhibits no distension, no abdominal bruit, no pulsatile midline mass and no mass. There is no hepatomegaly. There is no tenderness. There is no rebound and no guarding.  Musculoskeletal: He exhibits edema.  Lymphadenopathy:    He has no cervical adenopathy.  Neurological: He is alert. He has normal reflexes.  Skin: Skin is warm and dry. No rash noted.  Psychiatric: He has a normal mood and affect. His behavior is normal. Thought content normal.    Labs reviewed: Basic Metabolic Panel: Recent Labs    04/24/17 1818  04/29/17  05/08/17  06/22/17 1430 06/23/17 0621 06/24/17 0503 06/25/17 0439 06/30/17 09/01/17  NA 136   < >  --    < >  --    < >  --  136 140 139 138 142  K 4.5   < >  --    < >  --    < >  --  4.1 4.2 4.3 5.1 4.3  CL 108   < >  --   --   --    < >  --  102 103 104  --   --   CO2 20*   < >  --   --   --    < >  --  24 22 27   --   --  GLUCOSE 184*   < >  --   --   --    < >  --  87 72 57*  --   --   BUN 30*   < >  --    < >  --    < >  --  14 11 15 17 16   CREATININE 2.47*   < >  --    < >  --    < >  --  1.08 1.15 1.07 0.9 1.1  CALCIUM 7.9*   < >  --   --   --    < >  --  8.4* 8.6* 8.9  --   --   MG 1.7  --   --   --   --   --  1.8  --   --   --   --   --   TSH 0.153*  --  0.71  --  1.77  --   --   --   --   --   --   --    < > = values in this interval not displayed.   Liver Function Tests: Recent Labs    04/24/17 0325 04/24/17 1818 04/28/17 05/01/17 06/11/17  AST 26 40 15 26 19   ALT 13* 17 13 32 14  ALKPHOS 81 69 80 99 92  BILITOT 0.5 0.6  --   --   --   PROT 6.6 5.8*  --   --   --   ALBUMIN 3.0* 2.6*  --    --   --    No results for input(s): LIPASE, AMYLASE in the last 8760 hours. No results for input(s): AMMONIA in the last 8760 hours. CBC: Recent Labs    06/22/17 0825  06/24/17 0503 06/25/17 0439 06/26/17 0307 06/30/17 09/01/17  WBC 10.1  --  10.9* 11.2*  --  8.3 5.0  NEUTROABS 8.0*  --   --   --   --  6 3  HGB 6.1*   < > 10.8* 11.4* 10.3* 10.6* 11.2*  HCT 21.4*   < > 33.4* 37.8* 33.8* 33* 36*  MCV 74.0*  --  80.1 82.5  --   --   --   PLT 381  --  298 322  --  254 175   < > = values in this interval not displayed.   Lipid Panel: Recent Labs    09/01/17  CHOL 156  HDL 62  LDLCALC 77  TRIG 86   Lab Results  Component Value Date   HGBA1C 4.6 09/01/2017    Procedures since last visit: No results found.  Assessment/Plan     Magaret Justo S. Perlie Gold  [facility] [address] [place], [place] [address] [phone] Cell (Monday-Friday 8 AM - 5 PM) [phone] After 5 PM and follow prompts

## 2017-11-13 NOTE — Progress Notes (Signed)
Patient ID: Grant Lara, male   DOB: [birth year], 77 y.o.   MRN: [id number]  Location:  [facility]   Place of Service:  SNF (31) Provider:  [place], [place], DO  Patient Care Team: Gildardo Cranker, DO as PCP - General (Internal Medicine) Center, [place] ([place])  Extended Emergency Contact Information Primary Emergency Contact: Sollars,Curtis          [place], [place] Phone: [phone] Relation: Son Secondary Emergency Contact: Hockman,[name] Address: [address]          [place], [place] [address] [name] of [place] Mobile Phone: [phone] Relation: Spouse  Code Status:  DNR Goals of care: Advanced Directive information Advanced Directives 07/03/2017  Does Patient Have a Medical Advance Directive? Yes  Type of Advance Directive Out of facility DNR (pink MOST or yellow form)  Does patient want to make changes to medical advance directive? No - Patient declined  Copy of [place] in Chart? -  Would patient like information on creating a medical advance directive? No - Patient declined  Pre-existing out of facility DNR order (yellow form or pink MOST form) Yellow form placed in chart (order not valid for inpatient use)     Chief Complaint  Patient presents with  . Medical Management of Chronic Issues    OPTUM    HPI:  Pt is a 77 y.o. male seen today for medical management of chronic diseases.  He continues to have right neck pain but nothing new. He wears his cervical collar every day. Appetite ok and sleeps well. He is a poor historian due to dementia. Hx obtained from chart.  Seizure d/o - controlled on keppra 500 mg twice daily; no recent sz activity. Na 138  DM - diet controlled. A1c 5.9%; not on ACEI/ARB 2/2 low BPs  Bilateral lower extremity pain - controlled on tylenol 1 gm twice daily and ultram 50 mg twice daily  Dyslipidemia - stable off lipitor 2/2  weight loss; LDL 67   Dementia - progressive; he takes aricept 10 mg nightly. Weight up 8 lbs on mirtazepine. Albumin 3.3. 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 Stable without medication. He takes xeralto daily.  CAD - no angina. Stable on xeralto daily  COPD - sx's stable on prn duoneb  Depression - mood stable on zoloft 75 mg daily; no longer on trazodone 50 mg nightly; GDR done 03-05-17  CKD  - stage 3. Stable. Cr 1.1  Anemia of chronic disease - stable. Hgb 10.6  Hx PE - stable on xarelto 20 mg daily   Hx CVA - residual left hemiparesis; stable on xarelto 20 mg daily   Glaucoma - stable on xalatan to both eyes.   Crohn's disease - he has constipation that is stable on senna daily   Hypotension - improved. Current BP 124/68    Past Medical History:  Diagnosis Date  . AICD (automatic cardioverter/defibrillator) present   . Arthritis    "used to have a touch in my legs"  . Bradycardia 06/01/2014  . CAD (coronary artery disease)    LHC 9/05 with Dr. Einar Gip:  dLM 20-30%, LAD 85%, oD1 20-30%.  PCI:  Taxus DES to LAD; Dx jailed and tx with POBA.  Last myoview 12/10: inf scar,  no ischemia, EF 29%.  . CHF (congestive heart failure) ([place])   . Crohn's disease ([place])   . Dementia   . Diabetes mellitus    11/05/11 "borderline; don't take medications"  . Dilated cardiomyopathy ([place])    2/12: EF 30-35%, trivial AI, mild RAE.  EF 2014 40 -45%  . DVT (deep venous thrombosis) ([place])   . HCAP (healthcare-associated pneumonia) 09/29/2015  . HLD (hyperlipidemia)   . 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 Hyperlipidemia   . Hypertension   . Memory difficulties   . Nephrolithiasis   . PFO (patent foramen ovale)    Not mentioned on 2014 echo.  . Pneumonia ~ 2011   09-22-13 denies any recent SOB or breathing  problems  . Pulmonary embolus (HCC)    chronic coumadin  . Stroke [facility]) 1993   "left arm can't hold steady; leg too"  . Swelling of both ankles     09-22-13 occ.feet, but denies pain.   Past Surgical History:  Procedure Laterality Date  . APPENDECTOMY    . COLON SURGERY  1994; 1996   "for Crohn's disease"  . IMPLANTABLE CARDIOVERTER DEFIBRILLATOR IMPLANT N/A 06/02/2014   Procedure: IMPLANTABLE CARDIOVERTER DEFIBRILLATOR IMPLANT;  Surgeon: [name], MD;  Location: [facility] CATH LAB;  Service: Cardiovascular;  Laterality: N/A;    Allergies  Allergen Reactions  . Tuberculin Tests Other (See Comments)    Per MAR (no description provided)    Outpatient Encounter Medications as of 11/13/2017  Medication Sig  . donepezil (ARICEPT) 10 MG tablet Take 10 mg by mouth at bedtime.   . gabapentin (NEURONTIN) 100 MG capsule Take 100 mg by mouth at bedtime.  [name] ipratropium-albuterol (DUONEB) 0.5-2.5 (3) MG/3ML SOLN Take 3 mLs by nebulization every 6 (six) hours as needed (for breathing).   [name] latanoprost (XALATAN) 0.005 % ophthalmic solution Place 1 drop into both eyes at bedtime.  . levETIRAcetam (KEPPRA) 500 MG tablet Take 1 tablet (500 mg total) by mouth 2 (two) times daily.  . mirtazapine (REMERON) 7.5 MG tablet Take 7.5 mg by mouth at bedtime.  . Multiple Vitamin (MULTIVITAMIN) tablet Take 1 tablet by mouth daily.  . rivaroxaban (XARELTO) 20 MG TABS tablet Take 20 mg by mouth at bedtime.  . senna (SENOKOT) 8.6 MG TABS tablet Take 1 tablet by mouth every morning.  . sertraline (ZOLOFT) 50 MG tablet Take 75 mg by mouth daily.  . tamsulosin (FLOMAX) 0.4 MG CAPS capsule Take 0.4 mg by mouth at bedtime.  . traMADol (ULTRAM) 50 MG tablet Take 1 tablet (50 mg total) by mouth 3 (three) times daily. Hold for sedation   No facility-administered encounter medications on file as of 11/13/2017.     Review of Systems  Unable to perform ROS: Dementia    Immunization History  Administered Date(s)  Administered  . Influenza Whole 02/09/2010  . Influenza-Unspecified 01/21/2016  . PPD Test 11/05/2011, 10/09/2015  . Pneumococcal Polysaccharide-23 02/19/2010  . Tdap 06/05/2016   Pertinent  Health Maintenance Due  Topic Date Due  . INFLUENZA VACCINE  11/21/2017 (Originally 11/13/2017)  . PNA vac Low Risk Adult (2 of 2 - PCV13) 04/16/2023 (Originally 02/20/2011)  . HEMOGLOBIN A1C  03/04/2018  . OPHTHALMOLOGY EXAM  03/20/2018  . FOOT EXAM  09/11/2018  . URINE MICROALBUMIN  09/12/2018   Fall Risk  11/11/2016  Falls in the past year? Yes  Number falls in past yr: 2 or more  Injury with Fall? Yes   Functional Status Survey:    Vitals:  11/13/17 1456  BP: 133/85  Pulse: 60  Temp: (!) 97.3 F (36.3 C)  SpO2: 98%  Weight: 110 lb 12.8 oz (50.3 kg)   Body mass index is 19.63 kg/m. Physical Exam Constitutional: He appears well-developed.  Frail appearing sitting in w/c in NAD; cervical collar intact  HENT:  Mouth/Throat: Oropharynx is clear and moist.  MMM; no oral thrush  Eyes: Pupils are equal, round, and reactive to light. No scleral icterus.  Neck: Neck supple. Carotid bruit is not present. No thyromegaly present.  Cardiovascular: Normal rate, regular rhythm and intact distal pulses. Exam reveals no gallop and no friction rub.  Murmur (1/6 SEM) heard. Trace LE edema b/l. No calf TTP  Pulmonary/Chest: Effort normal and breath sounds normal. He has no wheezes. He has no rales. He exhibits no tenderness.  Abdominal: Soft. Bowel sounds are normal. He exhibits no distension, no abdominal bruit, no pulsatile midline mass and no mass. There is no hepatomegaly. There is no tenderness. There is no rebound and no guarding.  Musculoskeletal: He exhibits edema.  Lymphadenopathy:    He has no cervical adenopathy.  Neurological: He is alert. He has normal reflexes.  Skin: Skin is warm and dry. No rash noted.  Psychiatric: He has a normal mood and affect. His behavior is normal. Thought  content normal.    Labs reviewed: Recent Labs    04/24/17 1818  06/22/17 1430 06/23/17 0621 06/24/17 0503 06/25/17 0439 06/30/17 09/01/17  NA 136   < >  --  136 140 139 138 142  K 4.5   < >  --  4.1 4.2 4.3 5.1 4.3  CL 108   < >  --  102 103 104  --   --   CO2 20*   < >  --  24 22 27   --   --   GLUCOSE 184*   < >  --  87 72 57*  --   --   BUN 30*   < >  --  14 11 15 17 16   CREATININE 2.47*   < >  --  1.08 1.15 1.07 0.9 1.1  CALCIUM 7.9*   < >  --  8.4* 8.6* 8.9  --   --   MG 1.7  --  1.8  --   --   --   --   --    < > = values in this interval not displayed.   Recent Labs    04/24/17 0325 04/24/17 1818 04/28/17 05/01/17 06/11/17  AST 26 40 15 26 19   ALT 13* 17 13 32 14  ALKPHOS 81 69 80 99 92  BILITOT 0.5 0.6  --   --   --   PROT 6.6 5.8*  --   --   --   ALBUMIN 3.0* 2.6*  --   --   --    Recent Labs    06/22/17 0825  06/24/17 0503 06/25/17 0439 06/26/17 0307 06/30/17 09/01/17  WBC 10.1  --  10.9* 11.2*  --  8.3 5.0  NEUTROABS 8.0*  --   --   --   --  6 3  HGB 6.1*   < > 10.8* 11.4* 10.3* 10.6* 11.2*  HCT 21.4*   < > 33.4* 37.8* 33.8* 33* 36*  MCV 74.0*  --  80.1 82.5  --   --   --   PLT 381  --  298 322  --  254 175   < > =  values in this interval not displayed.   Lab Results  Component Value Date   TSH 1.77 05/08/2017   Lab Results  Component Value Date   HGBA1C 4.6 09/01/2017   Lab Results  Component Value Date   CHOL 156 09/01/2017   HDL 62 09/01/2017   LDLCALC 77 09/01/2017   TRIG 86 09/01/2017   CHOLHDL 3.1 04/16/2014    Significant Diagnostic Results in last 30 days:  No results found.  Assessment/Plan   ICD-10-CM   1. Neck pain on right side M54.2    stable; 2/2 C2 fx  2. Closed displaced fracture of fifth cervical vertebra, unspecified fracture morphology, sequela S12.400S   3. Vascular dementia without behavioral disturbance F01.50   4. Seizures ([place]) R56.9   5. Chronic combined systolic and diastolic CHF (congestive heart failure)  (HCC) I50.42   6. Type 2 diabetes mellitus with stage 3 chronic kidney disease, without long-term current use of insulin (HCC) E11.22    N18.3   7. Chronic obstructive pulmonary disease, unspecified COPD type ([place]) J44.9   8. 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 Perlie Gold  [facility] [address] [place], [place] [address] [phone] Cell (Monday-Friday 8 AM - 5 PM) [phone] After 5 PM and follow prompts

## 2017-11-27 ENCOUNTER — Non-Acute Institutional Stay (SKILLED_NURSING_FACILITY): Payer: Medicare Other

## 2017-11-27 DIAGNOSIS — Z0000 Encounter for general adult medical examination without abnormal findings: Secondary | ICD-10-CM

## 2017-11-27 NOTE — Progress Notes (Addendum)
Subjective:   MCCORMICK MACON is a 77 y.o. male who presents for Medicare Annual/Subsequent preventive examination at [facility]  Last AWV-11/11/2016    Objective:    Vitals: BP 132/82 (BP Location: Left Arm, Patient Position: Sitting)   Pulse 61   Temp (!) 97.4 F (36.3 C) (Oral)   Ht 5' 3"  (1.6 m)   Wt 110 lb (49.9 kg)   BMI 19.49 kg/m   Body mass index is 19.49 kg/m.  Advanced Directives 11/27/2017 07/03/2017 06/25/2017 06/22/2017 06/19/2017 04/28/2017 04/24/2017  Does Patient Have a Medical Advance Directive? Yes Yes - Yes Yes Yes No  Type of Advance Directive Out of facility DNR (pink MOST or yellow form) Out of facility DNR (pink MOST or yellow form) Out of facility DNR (pink MOST or yellow form) Out of facility DNR (pink MOST or yellow form) Out of facility DNR (pink MOST or yellow form) Out of facility DNR (pink MOST or yellow form) -  Does patient want to make changes to medical advance directive? No - Patient declined No - Patient declined No - Patient declined - No - Patient declined No - Patient declined -  Copy of [place] in [place]  Would patient like information on creating a medical advance directive? - No - Patient declined - - No - Patient declined No - Patient declined -  Pre-existing out of facility DNR order (yellow form or pink MOST form) Yellow form placed in chart (order not valid for inpatient use) Yellow form placed in chart (order not valid for inpatient use) - Yellow form placed in chart (order not valid for inpatient use) Pink MOST form placed in chart (order not valid for inpatient use) Pink MOST form placed in chart (order not valid for inpatient use) -    Tobacco Social History   Tobacco Use  Smoking Status Current Some Day Smoker  . Packs/day: 0.50  . Years: 53.00  . Pack years: 26.50  . Types: Cigarettes  Smokeless Tobacco Never Used     Ready to quit: Not Answered Counseling given: Not  Answered   Clinical Intake:  Pre-visit preparation completed: No  Pain : No/denies pain     Nutritional Risks: None Diabetes: Yes CBG done?: No Did pt. bring in CBG monitor from home?: No  How often do you need to have someone help you when you read instructions, pamphlets, or other written materials from your doctor or pharmacy?: 3 - Sometimes  Interpreter Needed?: No  Information entered by :: Tyson Dense, RN  Past Medical History:  Diagnosis Date  . AICD (automatic cardioverter/defibrillator) present   . Arthritis    "used to have a touch in my legs"  . Bradycardia 06/01/2014  . CAD (coronary artery disease)    LHC 9/05 with Dr. Einar Gip:  dLM 20-30%, LAD 85%, oD1 20-30%.  PCI:  Taxus DES to LAD; Dx jailed and tx with POBA.  Last myoview 12/10: inf scar, no ischemia, EF 29%.  . CHF (congestive heart failure) ([place])   . Crohn's disease ([place])   . Dementia   . Diabetes mellitus    11/05/11 "borderline; don't take medications"  . Dilated cardiomyopathy ([place])    2/12: EF 30-35%, trivial AI, mild RAE.  EF 2014 40 -45%  . DVT (deep venous thrombosis) ([place])   . HCAP (healthcare-associated pneumonia) 09/29/2015  . HLD (hyperlipidemia)   . 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 Hyperlipidemia   .  Hypertension   . Memory difficulties   . Nephrolithiasis   . PFO (patent foramen ovale)    Not mentioned on 2014 echo.  . Pneumonia ~ 2011   09-22-13 denies any recent SOB or breathing problems  . Pulmonary embolus (HCC)    chronic coumadin  . Stroke [facility]) 1993   "left arm can't hold steady; leg too"  . Swelling of both ankles     09-22-13 occ.feet, but denies pain.   Past Surgical History:  Procedure Laterality Date  . APPENDECTOMY    . COLON SURGERY  1994; 1996   "for Crohn's disease"  . IMPLANTABLE CARDIOVERTER DEFIBRILLATOR IMPLANT N/A 06/02/2014   Procedure: IMPLANTABLE CARDIOVERTER DEFIBRILLATOR IMPLANT;  Surgeon: [name], MD;  Location: [facility] CATH LAB;  Service:  Cardiovascular;  Laterality: N/A;   Family History  Problem Relation Age of Onset  . Diabetes type II Mother   . CAD Father   . Diabetes type II Brother    Social History   Socioeconomic History  . Marital status: Married    Spouse name: Not on file  . Number of children: Not on file  . Years of education: Not on file  . Highest education level: Not on file  Occupational History  . Not on file  Social Needs  . Financial resource strain: Not hard at all  . Food insecurity:    Worry: Never true    Inability: Never true  . Transportation needs:    Medical: No    Non-medical: No  Tobacco Use  . Smoking status: Current Some Day Smoker    Packs/day: 0.50    Years: 53.00    Pack years: 26.50    Types: Cigarettes  . Smokeless tobacco: Never Used  Substance and Sexual Activity  . Alcohol use: No    Alcohol/week: 0.0 standard drinks  . Drug use: No  . Sexual activity: Not Currently  Lifestyle  . Physical activity:    Days per week: 0 days    Minutes per session: 0 min  . Stress: Not at all  Relationships  . Social connections:    Talks on phone: Once a week    Gets together: Once a week    Attends religious service: Never    Active member of club or organization: No    Attends meetings of clubs or organizations: Never    Relationship status: Married  Other Topics Concern  . Not on file  Social History Narrative  . Not on file    Outpatient Encounter Medications as of 11/27/2017  Medication Sig  . donepezil (ARICEPT) 10 MG tablet Take 10 mg by mouth at bedtime.   . gabapentin (NEURONTIN) 100 MG capsule Take 100 mg by mouth at bedtime.  [name] ipratropium-albuterol (DUONEB) 0.5-2.5 (3) MG/3ML SOLN Take 3 mLs by nebulization every 6 (six) hours as needed (for breathing).   [name] latanoprost (XALATAN) 0.005 % ophthalmic solution Place 1 drop into both eyes at bedtime.  . levETIRAcetam (KEPPRA) 500 MG tablet Take 1 tablet (500 mg total) by mouth 2 (two) times daily.  .  mirtazapine (REMERON) 7.5 MG tablet Take 7.5 mg by mouth at bedtime.  . Multiple Vitamin (MULTIVITAMIN) tablet Take 1 tablet by mouth daily.  . rivaroxaban (XARELTO) 20 MG TABS tablet Take 20 mg by mouth at bedtime.  . senna (SENOKOT) 8.6 MG TABS tablet Take 1 tablet by mouth every morning.  . sertraline (ZOLOFT) 50 MG tablet Take 75 mg by mouth daily.  [name]  tamsulosin (FLOMAX) 0.4 MG CAPS capsule Take 0.4 mg by mouth at bedtime.  . traMADol (ULTRAM) 50 MG tablet Take 1 tablet (50 mg total) by mouth 3 (three) times daily. Hold for sedation   No facility-administered encounter medications on file as of 11/27/2017.     Activities of Daily Living In your present state of health, do you have any difficulty performing the following activities: 11/27/2017 06/25/2017  Hearing? N N  Vision? N N  Difficulty concentrating or making decisions? Tempie Donning  Walking or climbing stairs? Y Y  Dressing or bathing? Y Y  Doing errands, shopping? Tempie Donning  Preparing Food and eating ? Y -  Using the Toilet? Y -  In the past six months, have you accidently leaked urine? Y -  Do you have problems with loss of bowel control? Y -  Managing your Medications? Y -  Managing your Finances? Y -  Housekeeping or managing your Housekeeping? Y -  Some recent data might be hidden    Patient Care Team: Gildardo Cranker, DO as PCP - General (Internal Medicine) Center, [facility] ([place])   Assessment:   This is a routine wellness examination for Chayanne.  Exercise Activities and Dietary recommendations Current Exercise Habits: The patient does not participate in regular exercise at present, Exercise limited by: orthopedic condition(s)  Goals   None     Fall Risk Fall Risk  11/27/2017 11/11/2016  Falls in the past year? Yes Yes  Number falls in past yr: 2 or more 2 or more  Injury with Fall? Yes Yes   Is the patient's home free of loose throw rugs in walkways, pet beds, electrical cords, etc?   yes       Grab bars in the bathroom? yes      Handrails on the stairs?   yes      Adequate lighting?   yes  Timed Get Up and Go Performed: Nonambulatory   Depression Screen PHQ 2/9 Scores 11/27/2017 11/11/2016  PHQ - 2 Score 0 0    Cognitive Function     6CIT Screen 11/27/2017 11/11/2016  What Year? 4 points 4 points  What month? 3 points 0 points  What time? 0 points 3 points  Count back from 20 4 points 4 points  Months in reverse 4 points 4 points  Repeat phrase 8 points 10 points  Total Score 23 25    Immunization History  Administered Date(s) Administered  . Influenza Whole 02/09/2010  . Influenza-Unspecified 01/21/2016  . PPD Test 11/05/2011, 10/09/2015  . Pneumococcal Polysaccharide-23 02/19/2010  . Tdap 06/05/2016    Qualifies for Shingles Vaccine? Not in past records  Screening Tests Health Maintenance  Topic Date Due  . INFLUENZA VACCINE  11/13/2017  . PNA vac Low Risk Adult (2 of 2 - PCV13) 04/16/2023 (Originally 02/20/2011)  . HEMOGLOBIN A1C  03/04/2018  . FOOT EXAM  09/11/2018  . URINE MICROALBUMIN  09/12/2018  . OPHTHALMOLOGY EXAM  11/19/2018  . TETANUS/TDAP  06/05/2026   Cancer Screenings: Lung: Low Dose CT Chest recommended if Age 28-80 years, 30 pack-year currently smoking OR have quit w/in 15years. Patient does qualify. Colorectal: up to date  Additional Screenings:  Hepatitis C Screening: declined  Prevnar due: ordered    Plan:    I have personally reviewed and addressed the Medicare Annual Wellness questionnaire and have noted the following in the patient's chart:  A. Medical and social history B. Use of alcohol, tobacco or illicit drugs  C. Current medications and supplements D. Functional ability and status E.  Nutritional status F.  Physical activity G. Advance directives H. List of other physicians I.  Hospitalizations, surgeries, and ER visits in previous 12 months J.  [place] to include hearing, vision, cognitive,  depression L. Referrals and appointments - none  In addition, I have reviewed and discussed with patient certain preventive protocols, quality metrics, and best practice recommendations. A written personalized care plan for preventive services as well as general preventive health recommendations were provided to patient.  See attached scanned questionnaire for additional information.   Signed,   Tyson Dense, RN Nurse Health Advisor  Patient Concerns: Stated he broke eye glass lens 2 days ago. I ordered him to go to the eye doctor

## 2017-11-27 NOTE — Patient Instructions (Addendum)
[name] , Thank you for taking time to come for your Medicare Wellness Visit. I appreciate your ongoing commitment to your health goals. Please review the following plan we discussed and let me know if I can assist you in the future.   Screening recommendations/referrals: Colonoscopy excluded, over age 79 Recommended yearly ophthalmology/optometry visit for glaucoma screening and checkup Recommended yearly dental visit for hygiene and checkup  Vaccinations: Influenza vaccine due 2019 fall season Pneumococcal vaccine 13 due, ordered Tdap vaccine up to date, due 06/05/2026 Shingles vaccine not in past records    Advanced directives: in chart  Conditions/risks identified: non  Next appointment: Dr. Eulas Lara makes rounds  Preventive Care 73 Years and Older, Male Preventive care refers to lifestyle choices and visits with your health care provider that can promote health and wellness. What does preventive care include?  A yearly physical exam. This is also called an annual well check.  Dental exams once or twice a year.  Routine eye exams. Ask your health care provider how often you should have your eyes checked.  Personal lifestyle choices, including:  Daily care of your teeth and gums.  Regular physical activity.  Eating a healthy diet.  Avoiding tobacco and drug use.  Limiting alcohol use.  Practicing safe sex.  Taking low doses of aspirin every day.  Taking vitamin and mineral supplements as recommended by your health care provider. What happens during an annual well check? The services and screenings done by your health care provider during your annual well check will depend on your age, overall health, lifestyle risk factors, and family history of disease. Counseling  Your health care provider may ask you questions about your:  Alcohol use.  Tobacco use.  Drug use.  Emotional well-being.  Home and relationship well-being.  Sexual activity.  Eating  habits.  History of falls.  Memory and ability to understand (cognition).  Work and work Statistician. Screening  You may have the following tests or measurements:  Height, weight, and BMI.  Blood pressure.  Lipid and cholesterol levels. These may be checked every 5 years, or more frequently if you are over 22 years old.  Skin check.  Lung cancer screening. You may have this screening every year starting at age 72 if you have a 30-pack-year history of smoking and currently smoke or have quit within the past 15 years.  Fecal occult blood test (FOBT) of the stool. You may have this test every year starting at age 75.  Flexible sigmoidoscopy or colonoscopy. You may have a sigmoidoscopy every 5 years or a colonoscopy every 10 years starting at age 40.  Prostate cancer screening. Recommendations will vary depending on your family history and other risks.  Hepatitis C blood test.  Hepatitis B blood test.  Sexually transmitted disease (STD) testing.  Diabetes screening. This is done by checking your blood sugar (glucose) after you have not eaten for a while (fasting). You may have this done every 1-3 years.  Abdominal aortic aneurysm (AAA) screening. You may need this if you are a current or former smoker.  Osteoporosis. You may be screened starting at age 79 if you are at high risk. Talk with your health care provider about your test results, treatment options, and if necessary, the need for more tests. Vaccines  Your health care provider may recommend certain vaccines, such as:  Influenza vaccine. This is recommended every year.  Tetanus, diphtheria, and acellular pertussis (Tdap, Td) vaccine. You may need a Td booster every 10  years.  Zoster vaccine. You may need this after age 94.  Pneumococcal 13-valent conjugate (PCV13) vaccine. One dose is recommended after age 76.  Pneumococcal polysaccharide (PPSV23) vaccine. One dose is recommended after age 64. Talk to your health  care provider about which screenings and vaccines you need and how often you need them. This information is not intended to replace advice given to you by your health care provider. Make sure you discuss any questions you have with your health care provider. Document Released: 04/28/2015 Document Revised: 12/20/2015 Document Reviewed: 01/31/2015 Elsevier Interactive Patient Education  2017 [place] Prevention in the Home Falls can cause injuries. They can happen to people of all ages. There are many things you can do to make your home safe and to help prevent falls. What can I do on the outside of my home?  Regularly fix the edges of walkways and driveways and fix any cracks.  Remove anything that might make you trip as you walk through a door, such as a raised step or threshold.  Trim any bushes or trees on the path to your home.  Use bright outdoor lighting.  Clear any walking paths of anything that might make someone trip, such as rocks or tools.  Regularly check to see if handrails are loose or broken. Make sure that both sides of any steps have handrails.  Any raised decks and porches should have guardrails on the edges.  Have any leaves, snow, or ice cleared regularly.  Use sand or salt on walking paths during winter.  Clean up any spills in your garage right away. This includes oil or grease spills. What can I do in the bathroom?  Use night lights.  Install grab bars by the toilet and in the tub and shower. Do not use towel bars as grab bars.  Use non-skid mats or decals in the tub or shower.  If you need to sit down in the shower, use a plastic, non-slip stool.  Keep the floor dry. Clean up any water that spills on the floor as soon as it happens.  Remove soap buildup in the tub or shower regularly.  Attach bath mats securely with double-sided non-slip rug tape.  Do not have throw rugs and other things on the floor that can make you trip. What can I do  in the bedroom?  Use night lights.  Make sure that you have a light by your bed that is easy to reach.  Do not use any sheets or blankets that are too big for your bed. They should not hang down onto the floor.  Have a firm chair that has side arms. You can use this for support while you get dressed.  Do not have throw rugs and other things on the floor that can make you trip. What can I do in the kitchen?  Clean up any spills right away.  Avoid walking on wet floors.  Keep items that you use a lot in easy-to-reach places.  If you need to reach something above you, use a strong step stool that has a grab bar.  Keep electrical cords out of the way.  Do not use floor polish or wax that makes floors slippery. If you must use wax, use non-skid floor wax.  Do not have throw rugs and other things on the floor that can make you trip. What can I do with my stairs?  Do not leave any items on the stairs.  Make sure that  there are handrails on both sides of the stairs and use them. Fix handrails that are broken or loose. Make sure that handrails are as long as the stairways.  Check any carpeting to make sure that it is firmly attached to the stairs. Fix any carpet that is loose or worn.  Avoid having throw rugs at the top or bottom of the stairs. If you do have throw rugs, attach them to the floor with carpet tape.  Make sure that you have a light switch at the top of the stairs and the bottom of the stairs. If you do not have them, ask someone to add them for you. What else can I do to help prevent falls?  Wear shoes that:  Do not have high heels.  Have rubber bottoms.  Are comfortable and fit you well.  Are closed at the toe. Do not wear sandals.  If you use a stepladder:  Make sure that it is fully opened. Do not climb a closed stepladder.  Make sure that both sides of the stepladder are locked into place.  Ask someone to hold it for you, if possible.  Clearly mark  and make sure that you can see:  Any grab bars or handrails.  First and last steps.  Where the edge of each step is.  Use tools that help you move around (mobility aids) if they are needed. These include:  Canes.  Walkers.  Scooters.  Crutches.  Turn on the lights when you go into a dark area. Replace any light bulbs as soon as they burn out.  Set up your furniture so you have a clear path. Avoid moving your furniture around.  If any of your floors are uneven, fix them.  If there are any pets around you, be aware of where they are.  Review your medicines with your doctor. Some medicines can make you feel dizzy. This can increase your chance of falling. Ask your doctor what other things that you can do to help prevent falls. This information is not intended to replace advice given to you by your health care provider. Make sure you discuss any questions you have with your health care provider. Document Released: 01/26/2009 Document Revised: 09/07/2015 Document Reviewed: 05/06/2014 Elsevier Interactive Patient Education  2017 [employer].

## 2017-12-03 ENCOUNTER — Encounter: Payer: Self-pay | Admitting: Internal Medicine

## 2018-01-13 LAB — BASIC METABOLIC PANEL
BUN: 17 (ref 4–21)
Creatinine: 1.4 — AB (ref 0.6–1.3)
GLUCOSE: 129
POTASSIUM: 4.6 (ref 3.4–5.3)
SODIUM: 139 (ref 137–147)

## 2018-01-13 LAB — CBC AND DIFFERENTIAL
HCT: 37 — AB (ref 41–53)
Hemoglobin: 12 — AB (ref 13.5–17.5)
NEUTROS ABS: 4
Platelets: 225 (ref 150–399)
WBC: 6.3

## 2018-01-13 LAB — VITAMIN D 25 HYDROXY (VIT D DEFICIENCY, FRACTURES): VIT D 25 HYDROXY: 30.17

## 2018-02-02 ENCOUNTER — Encounter: Payer: Self-pay | Admitting: Internal Medicine

## 2018-02-02 ENCOUNTER — Non-Acute Institutional Stay (SKILLED_NURSING_FACILITY): Payer: Medicare Other | Admitting: Internal Medicine

## 2018-02-02 DIAGNOSIS — N183 Chronic kidney disease, stage 3 unspecified: Secondary | ICD-10-CM

## 2018-02-02 DIAGNOSIS — J449 Chronic obstructive pulmonary disease, unspecified: Secondary | ICD-10-CM

## 2018-02-02 DIAGNOSIS — S12400S Unspecified displaced fracture of fifth cervical vertebra, sequela: Secondary | ICD-10-CM

## 2018-02-02 DIAGNOSIS — I5042 Chronic combined systolic (congestive) and diastolic (congestive) heart failure: Secondary | ICD-10-CM

## 2018-02-02 DIAGNOSIS — F0150 Vascular dementia without behavioral disturbance: Secondary | ICD-10-CM | POA: Diagnosis not present

## 2018-02-02 DIAGNOSIS — I825Z9 Chronic embolism and thrombosis of unspecified deep veins of unspecified distal lower extremity: Secondary | ICD-10-CM

## 2018-02-02 DIAGNOSIS — E1122 Type 2 diabetes mellitus with diabetic chronic kidney disease: Secondary | ICD-10-CM

## 2018-02-02 DIAGNOSIS — Z8673 Personal history of transient ischemic attack (TIA), and cerebral infarction without residual deficits: Secondary | ICD-10-CM

## 2018-02-02 NOTE — Progress Notes (Signed)
Patient ID: Grant Lara, male   DOB: 1940-12-04, 77 y.o.   MRN: [id number]   Location:  [facility] Room Number: [place] of Service:  SNF (31) Provider:  [place], [place], DO  Patient Care Team: Gildardo Cranker, DO as PCP - [facility] ([facility], [place] ([place])  Extended Emergency Contact Information Primary Emergency Contact: Egger,Curtis          [place], [place] Phone: [phone] Relation: Son Secondary Emergency Contact: Portnoy,[name] Address: [address]          [place],  [address] [name] of [place] Mobile Phone: [phone] Relation: Spouse  Code Status:  DNR Goals of care: Advanced Directive information Advanced Directives 02/02/2018  Does Patient Have a Medical Advance Directive? Yes  Type of Advance Directive Out of facility DNR (pink MOST or yellow form)  Does patient want to make changes to medical advance directive? No - Patient declined  Copy of [place] in Chart? -  Would patient like information on creating a medical advance directive? -  Pre-existing out of facility DNR order (yellow form or pink MOST form) Yellow form placed in chart (order not valid for inpatient use)     Chief Complaint  Patient presents with  . Medical Management of Chronic Issues    [facility]    HPI:  Pt is a 77 y.o. male seen today for medical management of chronic diseases.  He has no concerns. No f/c. No falls. Appetite ok and sleeps well. No nursing issues. He is a poor historian due to dementia. Hx obtained from chart.   Seizure d/o - controlled on keppra 500 mg twice daily; no recent sz activity. Na 139  DM - diet controlled. A1c 5.9%; not on ACEI/ARB 2/2 low BPs  Bilateral lower extremity pain - controlled on tylenol 1 gm twice daily and ultram 50 mg twice daily  Dyslipidemia - stable off lipitor 2/2 weight loss; LDL 67   Dementia  - progressive; he takes aricept 10 mg nightly. Weight up 8 lbs on mirtazepine. Albumin 3.3. 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 Stable without medication. He takes xeralto daily.  CAD - no angina. Stable on xeralto daily  COPD - sx's stable on prn duoneb  Depression - mood stable on zoloft 75 mg daily; no longer on trazodone 50 mg nightly; GDR done 03-05-17  CKD  - stage 3. Stable. Cr 1.4  Anemia of chronic disease - stable. Hgb 12  Hx PE - stable on xarelto 20 mg daily   Hx CVA - residual left hemiparesis; stable on xarelto 20 mg daily   Glaucoma - stable on xalatan to both eyes.   Crohn's disease - he has constipation that is stable on senna daily   Hypotension - improved. Current BP 114/65  Past Medical History:  Diagnosis Date  . AICD (automatic cardioverter/defibrillator) present   . Arthritis    "used to have a touch in my legs"  . Bradycardia 06/01/2014  . CAD (coronary artery disease)    LHC 9/05 with Dr. Einar Gip:  dLM 20-30%, LAD 85%, oD1 20-30%.  PCI:  Taxus DES to LAD; Dx jailed and tx with POBA.  Last myoview 12/10: inf scar, no ischemia, EF 29%.  [name]  CHF (congestive heart failure) ([place])   . Crohn's disease ([place])   . Dementia ([place])   . Diabetes mellitus    11/05/11 "borderline; don't take medications"  . Dilated cardiomyopathy ([place])    2/12: EF 30-35%, trivial AI, mild RAE.  EF 2014 40 -45%  . DVT (deep venous thrombosis) ([place])   . HCAP (healthcare-associated pneumonia) 09/29/2015  . HLD (hyperlipidemia)   . 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 Hyperlipidemia   . Hypertension   . Memory difficulties   . Nephrolithiasis   . PFO (patent foramen ovale)    Not mentioned on 2014 echo.  . Pneumonia ~ 2011   09-22-13 denies any recent SOB or breathing problems  . Pulmonary embolus (HCC)     chronic coumadin  . Stroke [facility]) 1993   "left arm can't hold steady; leg too"  . Swelling of both ankles     09-22-13 occ.feet, but denies pain.   Past Surgical History:  Procedure Laterality Date  . APPENDECTOMY    . COLON SURGERY  1994; 1996   "for Crohn's disease"  . IMPLANTABLE CARDIOVERTER DEFIBRILLATOR IMPLANT N/A 06/02/2014   Procedure: IMPLANTABLE CARDIOVERTER DEFIBRILLATOR IMPLANT;  Surgeon: [name], MD;  Location: [facility] CATH LAB;  Service: Cardiovascular;  Laterality: N/A;    Allergies  Allergen Reactions  . Tuberculin Tests Other (See Comments)    Per MAR (no description provided)    Outpatient Encounter Medications as of 02/02/2018  Medication Sig  . cholecalciferol (VITAMIN D) 1000 units tablet Take 1,000 Units by mouth daily.  [name] gabapentin (NEURONTIN) 100 MG capsule Take 100 mg by mouth at bedtime.  [name] guaiFENesin (MUCINEX) 600 MG 12 hr tablet Take 600 mg by mouth every 8 (eight) hours as needed for cough.  [name] ipratropium-albuterol (DUONEB) 0.5-2.5 (3) MG/3ML SOLN Take 3 mLs by nebulization every 6 (six) hours as needed (for breathing).   [name] latanoprost (XALATAN) 0.005 % ophthalmic solution Place 1 drop into both eyes at bedtime.  . levETIRAcetam (KEPPRA) 500 MG tablet Take 1 tablet (500 mg total) by mouth 2 (two) times daily.  [name] loperamide (IMODIUM A-D) 2 MG tablet Take 2 mg by mouth every 8 (eight) hours as needed for diarrhea or loose stools.  . mirtazapine (REMERON) 7.5 MG tablet Take 7.5 mg by mouth at bedtime.  . Multiple Vitamin (MULTIVITAMIN) tablet Take 1 tablet by mouth daily.  . NON FORMULARY Diet Type:  Regular Diet - Regular / Thin consistency  . rivaroxaban (XARELTO) 20 MG TABS tablet Take 20 mg by mouth at bedtime.  . sertraline (ZOLOFT) 50 MG tablet Take 75 mg by mouth daily.  . tamsulosin (FLOMAX) 0.4 MG CAPS capsule Take 0.4 mg by mouth at bedtime.  . traMADol (ULTRAM) 50 MG tablet Take 1 tablet (50 mg total) by mouth 3 (three) times daily. Hold for  sedation  . [DISCONTINUED] donepezil (ARICEPT) 10 MG tablet Take 10 mg by mouth at bedtime.   . [DISCONTINUED] senna (SENOKOT) 8.6 MG TABS tablet Take 1 tablet by mouth every morning.   No facility-administered encounter medications on file as of 02/02/2018.     Review of Systems  Unable to perform ROS: Dementia    Immunization History  Administered Date(s) Administered  . Influenza Whole 02/09/2010  . Influenza-Unspecified 01/21/2016  . PPD Test 11/05/2011, 10/09/2015, 11/05/2017  . Pneumococcal Polysaccharide-23 02/19/2010  . Tdap 06/05/2016   Pertinent  Health Maintenance Due  Topic Date Due  . INFLUENZA VACCINE  03/05/2018 (Originally 11/13/2017)  . PNA  vac Low Risk Adult (2 of 2 - PCV13) 04/16/2023 (Originally 02/20/2011)  . HEMOGLOBIN A1C  03/04/2018  . URINE MICROALBUMIN  09/12/2018  . OPHTHALMOLOGY EXAM  11/19/2018  . FOOT EXAM  12/12/2018   Fall Risk  11/27/2017 11/11/2016  Falls in the past year? Yes Yes  Number falls in past yr: 2 or more 2 or more  Injury with Fall? Yes Yes   Functional Status Survey:    Vitals:   02/02/18 1407  BP: 114/65  Pulse: 70  Resp: 18  Temp: (!) 97.5 F (36.4 C)  SpO2: 98%  Weight: 118 lb (53.5 kg)  Height: 5' 3"  (1.6 m)   Body mass index is 20.9 kg/m. Physical Exam  Constitutional: He appears well-developed.  Frail appearing in NAD, sitting in w/c  HENT:  Mouth/Throat: Oropharynx is clear and moist.  MMM; no oral thrush  Eyes: Pupils are equal, round, and reactive to light. No scleral icterus.  Neck: Neck supple. Carotid bruit is not present. No thyromegaly present.  Neck collar intact  Cardiovascular: Normal rate, regular rhythm and intact distal pulses. Exam reveals no gallop and no friction rub.  Murmur (1/6 SEM) heard. no distal LE swelling. No calf TTP  Pulmonary/Chest: Effort normal and breath sounds normal. He has no wheezes. He has no rales. He exhibits no tenderness.  Abdominal: Soft. Bowel sounds are normal. He  exhibits no distension, no abdominal bruit, no pulsatile midline mass and no mass. There is no hepatomegaly. There is no tenderness. There is no rebound and no guarding.  Musculoskeletal: He exhibits edema (small joints).  Lymphadenopathy:    He has no cervical adenopathy.  Neurological: He is alert. He has normal reflexes.  Skin: Skin is warm and dry. No rash noted.  Psychiatric: He has a normal mood and affect. His behavior is normal. Thought content normal.    Labs reviewed: Recent Labs    04/24/17 1818  06/22/17 1430 06/23/17 0621 06/24/17 0503 06/25/17 0439 06/30/17 09/01/17 01/13/18  NA 136   < >  --  136 140 139 138 142 139  K 4.5   < >  --  4.1 4.2 4.3 5.1 4.3 4.6  CL 108   < >  --  102 103 104  --   --   --   CO2 20*   < >  --  24 22 27   --   --   --   GLUCOSE 184*   < >  --  87 72 57*  --   --   --   BUN 30*   < >  --  14 11 15 17 16 17   CREATININE 2.47*   < >  --  1.08 1.15 1.07 0.9 1.1 1.4*  CALCIUM 7.9*   < >  --  8.4* 8.6* 8.9  --   --   --   MG 1.7  --  1.8  --   --   --   --   --   --    < > = values in this interval not displayed.   Recent Labs    04/24/17 0325 04/24/17 1818 04/28/17 05/01/17 06/11/17  AST 26 40 15 26 19   ALT 13* 17 13 32 14  ALKPHOS 81 69 80 99 92  BILITOT 0.5 0.6  --   --   --   PROT 6.6 5.8*  --   --   --   ALBUMIN 3.0* 2.6*  --   --   --  Recent Labs    06/22/17 0825  06/24/17 0503 06/25/17 0439  06/30/17 09/01/17 01/13/18  WBC 10.1  --  10.9* 11.2*  --  8.3 5.0 6.3  NEUTROABS 8.0*  --   --   --   --  6 3 4   HGB 6.1*   < > 10.8* 11.4*   < > 10.6* 11.2* 12.0*  HCT 21.4*   < > 33.4* 37.8*   < > 33* 36* 37*  MCV 74.0*  --  80.1 82.5  --   --   --   --   PLT 381  --  298 322  --  254 175 225   < > = values in this interval not displayed.   Lab Results  Component Value Date   TSH 1.77 05/08/2017   Lab Results  Component Value Date   HGBA1C 4.6 09/01/2017   Lab Results  Component Value Date   CHOL 156 09/01/2017   HDL 62  09/01/2017   LDLCALC 77 09/01/2017   TRIG 86 09/01/2017   CHOLHDL 3.1 04/16/2014    Significant Diagnostic Results in last 30 days:  No results found.  Assessment/Plan   ICD-10-CM   1. Vascular dementia without behavioral disturbance (HCC) F01.50   2. Type 2 diabetes mellitus with stage 3 chronic kidney disease, without long-term current use of insulin (HCC) E11.22    N18.3   3. Closed displaced fracture of fifth cervical vertebra, unspecified fracture morphology, sequela S12.400S   4. Chronic combined systolic and diastolic CHF (congestive heart failure) (HCC) I50.42   5. Chronic deep vein thrombosis (DVT) of distal vein of lower extremity, unspecified laterality (HCC) I82.5Z9   6. Chronic obstructive pulmonary disease, unspecified COPD type ([place]) J44.9   7. History of stroke Z86.73      Cont current meds as ordered  PT/OT/ST as indicated  Cont neck collar as ordered  Nutritional supplements as indicated  [facility] NP to follow  Will follow   Labs/tests ordered:  none   Bryton Romagnoli S. Perlie Gold  [facility] [address] [place], [place] [address] [phone] Cell (Monday-Friday 8 AM - 5 PM) [phone] After 5 PM and follow prompts

## 2018-02-09 ENCOUNTER — Encounter: Payer: Self-pay | Admitting: Internal Medicine

## 2018-02-19 IMAGING — CT CT CERVICAL SPINE W/O CM
4 series · 16 of 33 positions shown, 19 images · non-contrast
Comparison: Head CT 12/21/2014

CLINICAL DATA: Unwitnessed fall at [HOSPITAL]. Initial encounter.

EXAM:
CT HEAD WITHOUT CONTRAST
CT CERVICAL SPINE WITHOUT CONTRAST
TECHNIQUE: Multidetector CT imaging of the head and cervical spine was
performed following the standard protocol without intravenous
contrast. Multiplanar CT image reconstructions of the cervical spine
were also generated.

[Series 4: c_spine 2.0 st · axial · 0.21mm/px · z∈[-244,-124]mm · 5 of 89 slices shown, 7 images]
[im 15/89  soft-tissue]
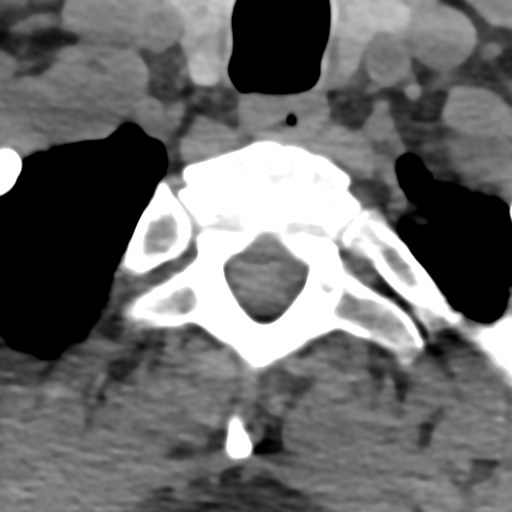
[im 15/89  bone]
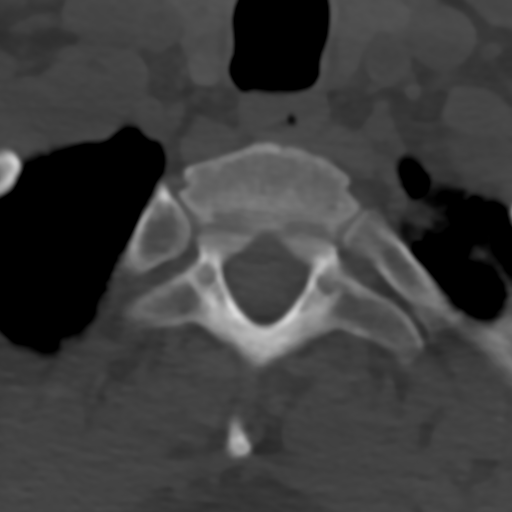
[im 30/89  bone]
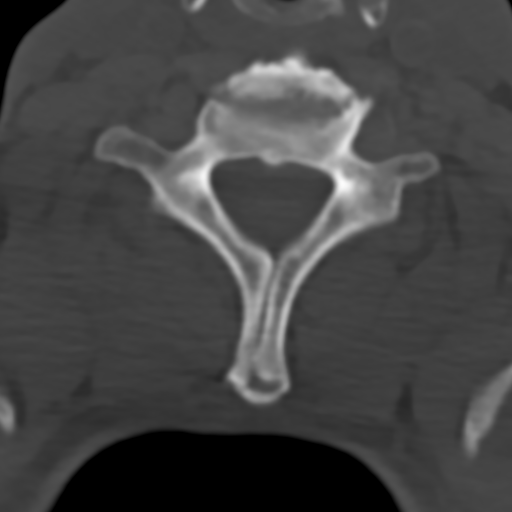
[im 45/89  bone]
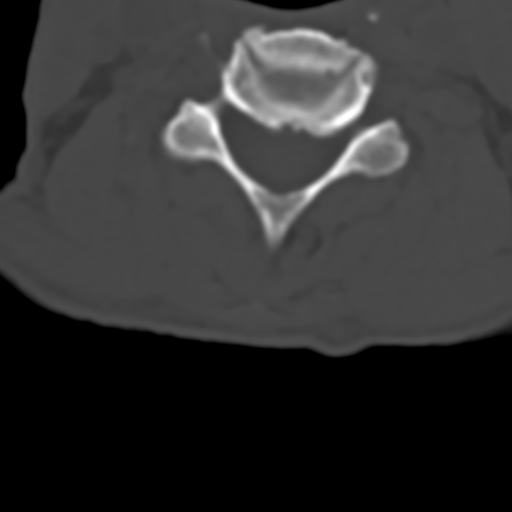
[im 59/89  bone]
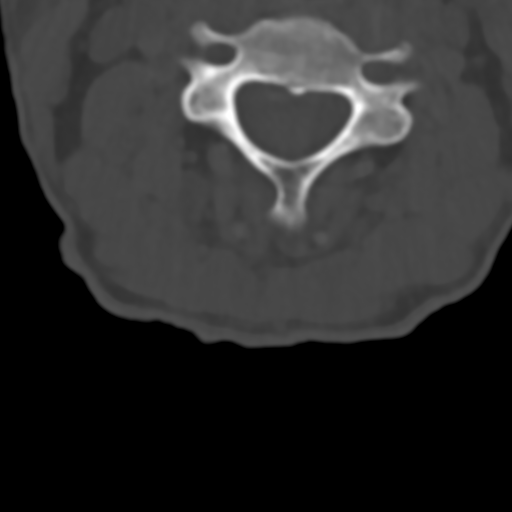
[im 74/89  soft-tissue]
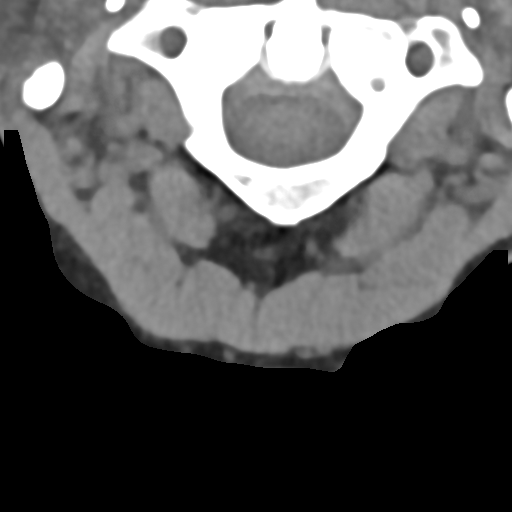
[im 74/89  bone]
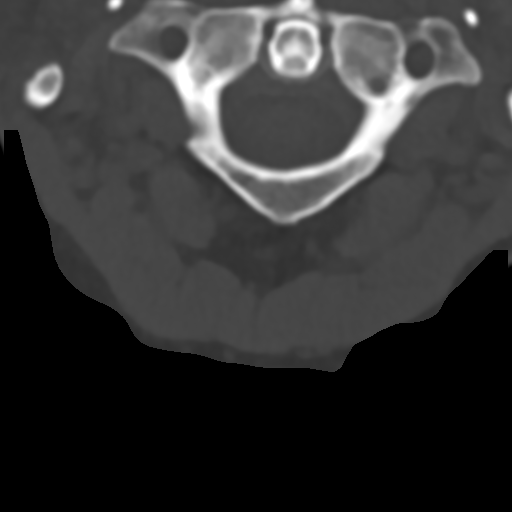

[Series 602: sagittal · sagittal · 0.49mm/px · 5 of 46 slices shown, 6 images]
[im 16/46  bone]
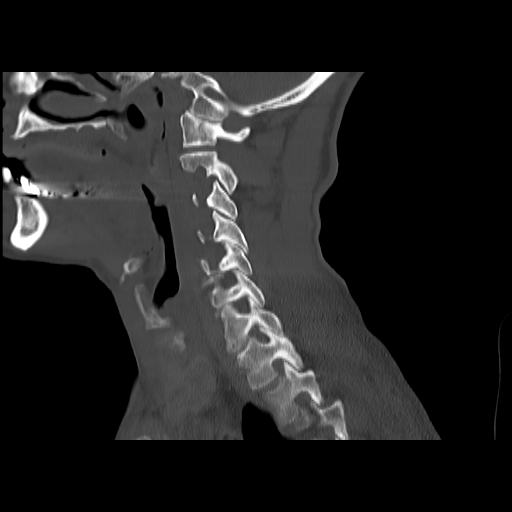
[im 19/46  bone]
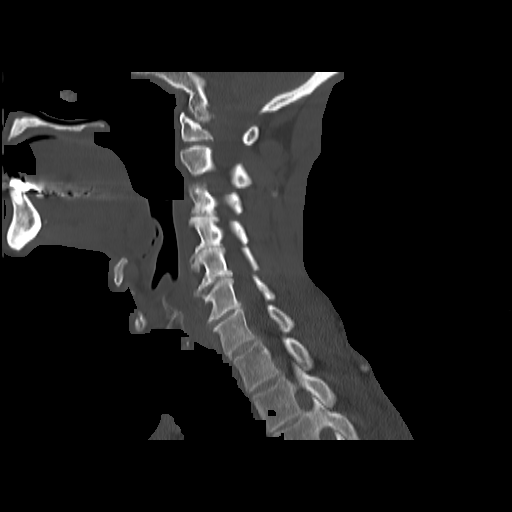
[im 23/46  soft-tissue]
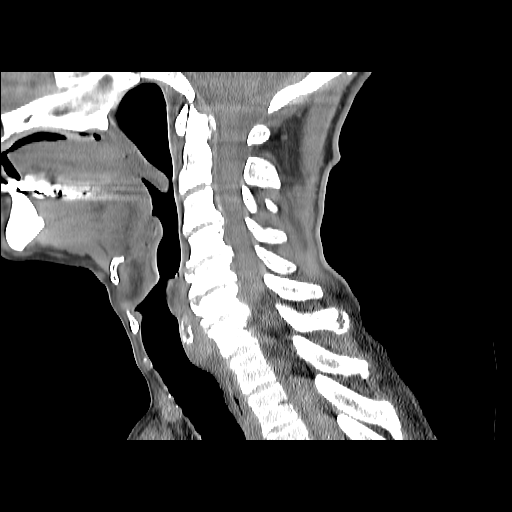
[im 23/46  bone]
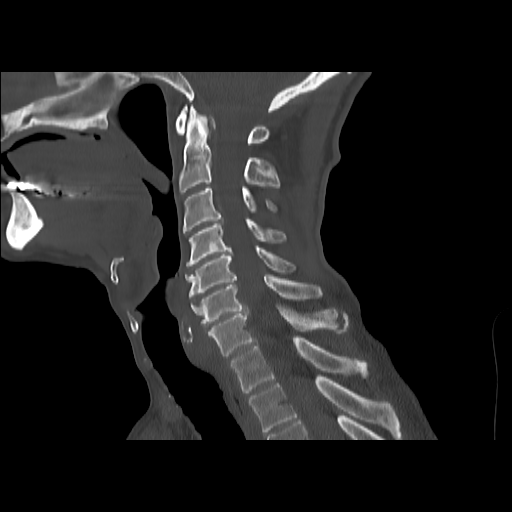
[im 27/46  bone]
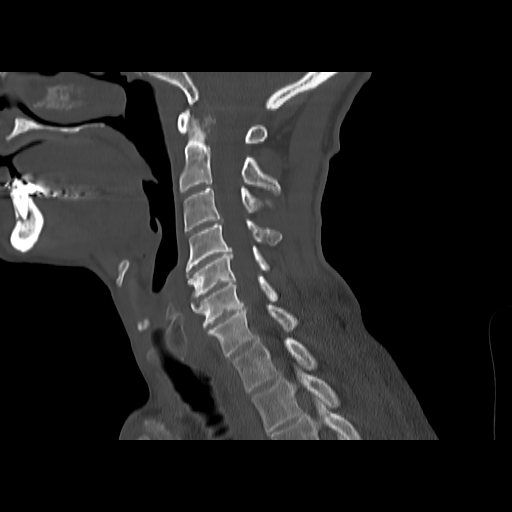
[im 31/46  bone]
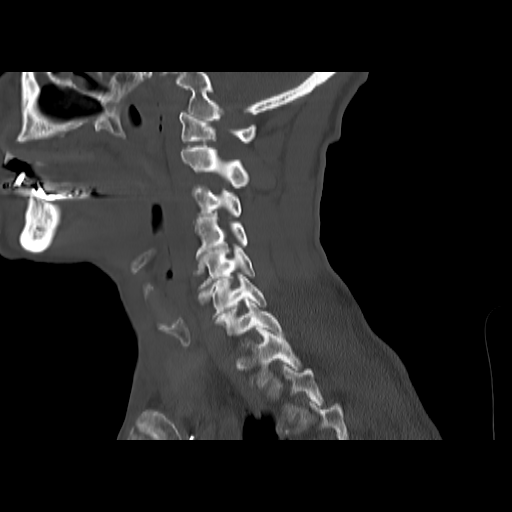

[Series 603: coronal · coronal · 0.49mm/px · 3 of 46 slices shown]
[im 10/46  bone]
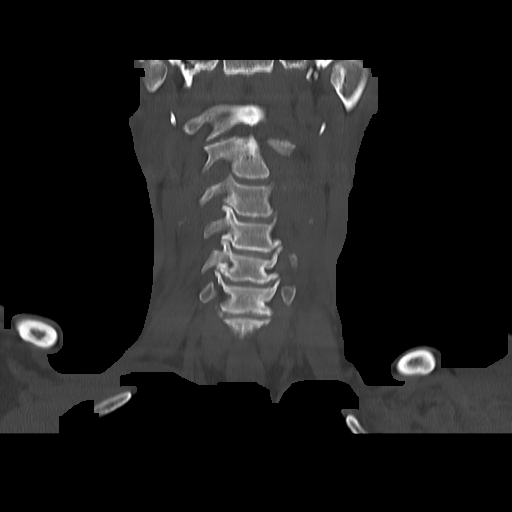
[im 19/46  bone]
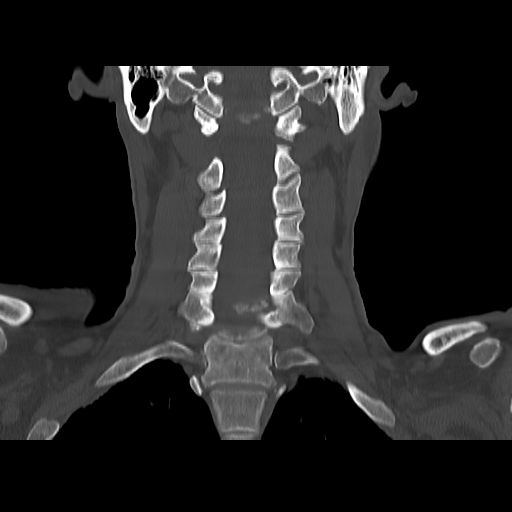
[im 28/46  bone]
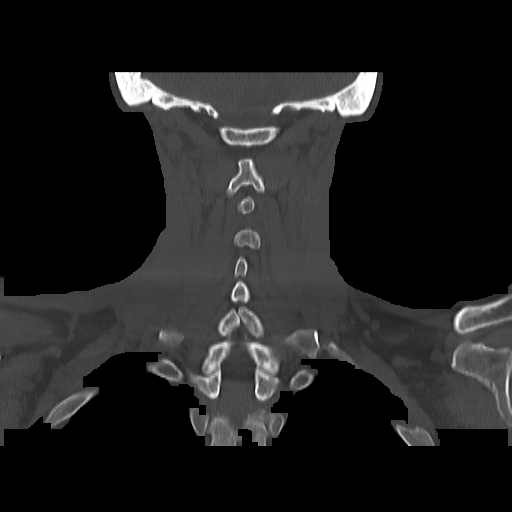

[Series 604: orthogonal · axial · 0.49mm/px · z∈[-279,-225]mm · 3 of 88 slices shown]
[im 15/88  bone]
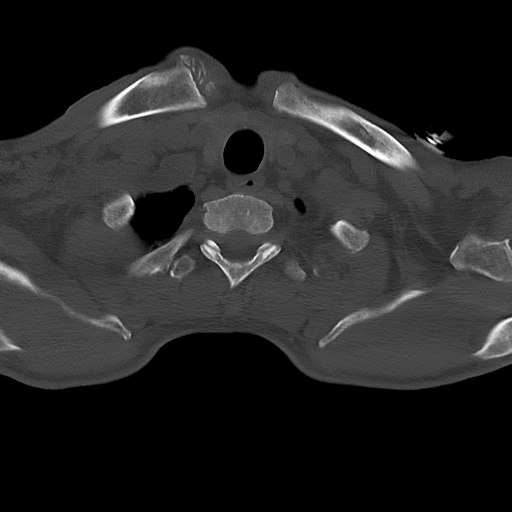
[im 30/88  bone]
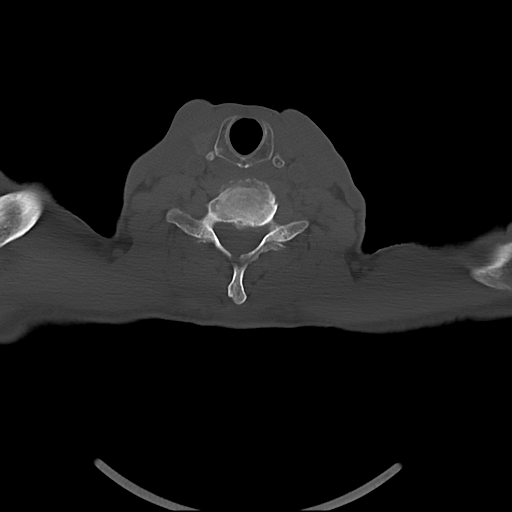
[im 44/88  bone]
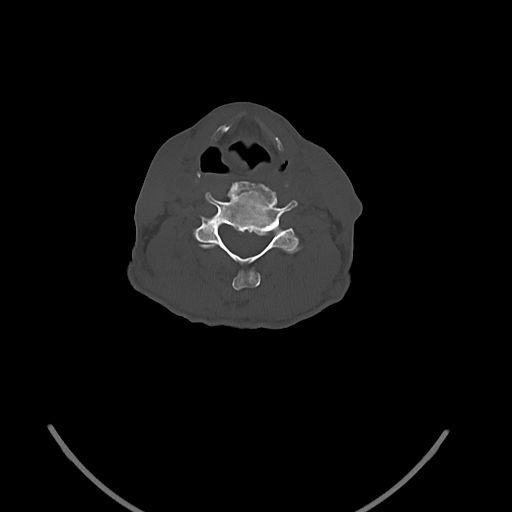

[16 of 33 positions shown; findings below may reference images not displayed]

FINDINGS: CT HEAD FINDINGS

Skull and Sinuses:Negative for fracture or hemo sinus. Benign
ground-glass density in the left occipital bone that is small and
stable.

Visualized orbits: Gaze to the right, nonspecific. No evidence of
orbital injury.

Brain: No evidence of acute infarction, hemorrhage, hydrocephalus,
or mass lesion/mass effect. There is advanced chronic ischemic
injury with numerous bilateral cerebellar infarcts, largest area in
the inferior left cerebellum. Multiple bilateral subcortical
infarcts, most discrete in the right thalamus and caudate head.
Extensive fairly symmetric cortical infarct and atrophy, aligned in
a watershed distribution.

CT CERVICAL SPINE FINDINGS

Negative for acute fracture or subluxation. No prevertebral edema.
No gross cervical canal hematoma. Diffuse disc degeneration and
spondylotic spurring. No indication of high-grade canal stenosis.
IMPRESSION: 1. No evidence of acute intracranial or cervical spine injury.
2. Extensive chronic ischemic injury as described, stable from
12/21/2014 comparison.

## 2018-11-23 ENCOUNTER — Other Ambulatory Visit: Payer: Self-pay

## 2018-11-23 ENCOUNTER — Non-Acute Institutional Stay: Payer: Medicare Other | Admitting: Internal Medicine
